# Patient Record
Sex: Female | Born: 1975 | Race: Black or African American | Hispanic: No | Marital: Single | Smoking: Former smoker
Health system: Southern US, Community
[De-identification: ages and names within clinical notes are randomized; demographics above are authoritative.]

## PROBLEM LIST (undated history)

## (undated) DIAGNOSIS — I255 Ischemic cardiomyopathy: Secondary | ICD-10-CM

## (undated) DIAGNOSIS — R519 Headache, unspecified: Secondary | ICD-10-CM

## (undated) DIAGNOSIS — L309 Dermatitis, unspecified: Secondary | ICD-10-CM

## (undated) DIAGNOSIS — D649 Anemia, unspecified: Secondary | ICD-10-CM

## (undated) DIAGNOSIS — R0602 Shortness of breath: Secondary | ICD-10-CM

## (undated) DIAGNOSIS — F419 Anxiety disorder, unspecified: Secondary | ICD-10-CM

## (undated) DIAGNOSIS — I10 Essential (primary) hypertension: Secondary | ICD-10-CM

## (undated) DIAGNOSIS — Z9289 Personal history of other medical treatment: Secondary | ICD-10-CM

## (undated) DIAGNOSIS — K219 Gastro-esophageal reflux disease without esophagitis: Secondary | ICD-10-CM

## (undated) DIAGNOSIS — M869 Osteomyelitis, unspecified: Secondary | ICD-10-CM

## (undated) DIAGNOSIS — E119 Type 2 diabetes mellitus without complications: Secondary | ICD-10-CM

## (undated) DIAGNOSIS — Z87442 Personal history of urinary calculi: Secondary | ICD-10-CM

## (undated) DIAGNOSIS — N186 End stage renal disease: Secondary | ICD-10-CM

## (undated) DIAGNOSIS — R0600 Dyspnea, unspecified: Secondary | ICD-10-CM

## (undated) DIAGNOSIS — G629 Polyneuropathy, unspecified: Secondary | ICD-10-CM

## (undated) DIAGNOSIS — J189 Pneumonia, unspecified organism: Secondary | ICD-10-CM

## (undated) DIAGNOSIS — R51 Headache: Secondary | ICD-10-CM

## (undated) HISTORY — PX: INSERTION OF DIALYSIS CATHETER: SHX1324

---
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
## 2010-12-03 ENCOUNTER — Other Ambulatory Visit (HOSPITAL_COMMUNITY): Payer: Self-pay | Admitting: Podiatry

## 2010-12-03 DIAGNOSIS — M869 Osteomyelitis, unspecified: Secondary | ICD-10-CM

## 2010-12-04 ENCOUNTER — Ambulatory Visit (HOSPITAL_COMMUNITY)
Admission: RE | Admit: 2010-12-04 | Discharge: 2010-12-04 | Disposition: A | Discharge status: Home / Self Care | Payer: Medicaid Other | Source: Ambulatory Visit | Attending: Podiatry | Admitting: Podiatry

## 2010-12-04 DIAGNOSIS — M25476 Effusion, unspecified foot: Secondary | ICD-10-CM | POA: Insufficient documentation

## 2010-12-04 DIAGNOSIS — M869 Osteomyelitis, unspecified: Secondary | ICD-10-CM | POA: Insufficient documentation

## 2010-12-04 DIAGNOSIS — L97509 Non-pressure chronic ulcer of other part of unspecified foot with unspecified severity: Secondary | ICD-10-CM | POA: Insufficient documentation

## 2010-12-04 DIAGNOSIS — E119 Type 2 diabetes mellitus without complications: Secondary | ICD-10-CM | POA: Insufficient documentation

## 2010-12-04 DIAGNOSIS — L02619 Cutaneous abscess of unspecified foot: Secondary | ICD-10-CM | POA: Insufficient documentation

## 2010-12-04 DIAGNOSIS — R609 Edema, unspecified: Secondary | ICD-10-CM | POA: Insufficient documentation

## 2010-12-04 DIAGNOSIS — L03039 Cellulitis of unspecified toe: Secondary | ICD-10-CM | POA: Insufficient documentation

## 2010-12-04 DIAGNOSIS — M25473 Effusion, unspecified ankle: Secondary | ICD-10-CM | POA: Insufficient documentation

## 2010-12-04 LAB — CREATININE, SERUM: Creatinine, Ser: 1.03 mg/dL (ref 0.4–1.2)

## 2010-12-04 MED ORDER — GADOBENATE DIMEGLUMINE 529 MG/ML IV SOLN
20.0000 mL | Freq: Once | INTRAVENOUS | Status: AC | PRN
  Administered 2010-12-04: 20 mL via INTRAVENOUS

## 2011-02-09 ENCOUNTER — Encounter (HOSPITAL_BASED_OUTPATIENT_CLINIC_OR_DEPARTMENT_OTHER): Payer: Medicaid Other | Attending: Internal Medicine

## 2011-02-09 ENCOUNTER — Other Ambulatory Visit (HOSPITAL_BASED_OUTPATIENT_CLINIC_OR_DEPARTMENT_OTHER): Payer: Self-pay | Admitting: Internal Medicine

## 2011-02-09 DIAGNOSIS — Z794 Long term (current) use of insulin: Secondary | ICD-10-CM | POA: Insufficient documentation

## 2011-02-09 DIAGNOSIS — E1169 Type 2 diabetes mellitus with other specified complication: Secondary | ICD-10-CM | POA: Insufficient documentation

## 2011-02-09 DIAGNOSIS — L97509 Non-pressure chronic ulcer of other part of unspecified foot with unspecified severity: Secondary | ICD-10-CM | POA: Insufficient documentation

## 2011-02-10 NOTE — Assessment & Plan Note (Signed)
[facility]  NAME:  Karen [name], Karen [name]                 ACCOUNT NO.:  [id number]  MEDICAL RECORD NO.:  [id number]      DATE OF BIRTH:  Feb 07, 1976  PHYSICIAN:  Orlando Penner. Sevier, M.D.  VISIT DATE:  02/09/2011                                  OFFICE VISIT   HISTORY:  This 35 year old black female, self-referred for evaluation of a diabetic ulceration of her left hallux.  The patient reports that she has had diabetes since 1995 and has been on again, off again in her attention to appropriate self-management of the disease.  A blood sugar here of 400 today would suggest that we are on off again mode and indeed, she mentions that she neglected to take her medication, namely Lantus insulin last p.m. or her oral diabetic agent.  She does say that she has had no ocular or retinal complications for diabetes, of which she is aware and likewise no known kidney disease on that basis.  She is aware of the term hemoglobin A1c, I think she had one done not too long ago and thinks it may have been in the range of 8. She says, however, that her blood sugars when she is attentive to taking her current medications are usually in the 200 range.  With that background history, she apparently developed some months ago a ulcer on the plantar aspect of her left hallux.  She sought treatment for this and was evaluated by physicians from the [facility] area and was found on MRI to have osteomyelitis.  She was referred to a podiatric surgeon here in [place] who reviewed the MRIs and said that she needed her toe amputated and so the patient being unwilling to do that simply never followed up.  She apparently was advised by her primary physician that an amputation was not necessary and when the wound began to drain, additionally become quite malodorous, he recommended Betadine soaks and indeed, the situation seemed to improve and to dry up somewhat.  In the meanwhile, she has talked to several  patients who have been seen here and they have indicated that we might can save this toe and have indicated to her that we have available hyperbaric oxygen therapy, and she is accordingly here today for our evaluation and advice.  PAST MEDICAL HISTORY:  Notable for C-section in 2008 that was for twins, two other hospitalizations for childbirth, one 17 years ago and the other 13 months ago.  She apparently has had no hospitalizations in conjunction with medical problems.  ALLERGIES:  PENICILLIN and MORPHINE which she said they cause rashes.  CURRENT MEDICATIONS:  Lantus 10 units which is supposed to be taken at bedtime daily, but occasionally is taken in the morning instead and as previously indicated was not taken last night at all.  She is also on Kombiglyze XR 5/500 mg once daily as an oral antihyperglycemic agent. She likewise has not taken that in the past 24 hours.  FAMILY HISTORY:  Apparently positive for type 2 diabetes but a little detail was revealed by the patient.  PERSONAL HISTORY:  She is single and lives alone, is fully ambulatory, able to attend all of her personal and household needs.  She says she does not smoke, drink alcohol, or use recreational drugs.  She has the four children as indicated above, a 39 year old and twins 34 years old and a 8-month-old.  She does work part-time for [employer] where she is on her feet 100% of the time and not allowed to wear open toed shoes, but she says this is only for several hours several days a week.  REVIEW OF SYSTEMS:  The patient does have diabetes as indicated above and all the other pertinent features have been covered above.  She of course is not on dialysis.  She has no history of cancer, radiation, or chemotherapy.  She has no history of seizures or other neurologic difficulties.  She does admit that there is some diminished feeling in her feet.  She has no history of peptic ulcer disease, GI bleeding.   No urinary tract difficulties.  She has never been anemic and has no other active symptomatology at present other than that related to her toe.  PHYSICAL EXAMINATION:  VITAL SIGNS:  Blood pressure is 158/107, pulse is 102, respirations are 20, and temperature is 97.7.  Random blood sugar is 400 mg/dL. GENERAL:  She is somewhat obese pleasant early middle-aged black female, in no acute distress although she does later say that she has throbbing pain in her toe and takes Percocet for that as needed which apparently has been provided somewhere else. HEENT:  Her mucous membranes are moist, they are slightly pale. NECK:  Supple.  There is no thyroid enlargement. CHEST:  Grossly clear on auscultation. HEART:  Normal in size with regular rhythm, definite S4. ABDOMEN:  Without organomegaly, masses, or tenderness. EXTREMITIES:  Free of edema other than for a significant soft tissue swelling in the left hallux which will be further described later.  Her peripheral pulses are everywhere palpable.  She has no obvious venous varicosities. NEUROLOGIC:  Not accomplished in detail but is grossly normal with the exception of some decreased sensation in the feet. On the left foot, there is at the distal tuft of the hallux an ulcer measuring 1.3 x 0.5 x 0.1 cm with lots of callus surrounding it and considerable accumulation of old slough which is mostly dry.  There is no tenderness in the toe.  There is no warmth there.  There is no fluctuance.  The wound does not appear at this point to probe to bone.  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  DISPOSITION:  Since the patient says her MRI was done only a month ago while we are attempting to trace down the results of that study, I will not repeat it.  She says, however, that she has not had a routine x-ray of that foot and I will obtain  that at this point, which will probably give me a considerable information as to the degree of destruction of bone if there is any.  We do anticipate that if osteomyelitis is indeed present, she will need HBO, so we have begun appropriate laboratory evaluations for that to include a chest x-ray to be done while her foot is being x-rayed today as well as some broad blood work.  A culture was made of the toe wound today following debridement.  The wound is, in fact, debrided by removal of considerable callus, some old skin, and some rather dried, crusty slough from the wound base.  The wound is then dressed with an application of Santyl and Medihoney covered by collagen dressing  and a bulky wrap.  Plans will be made to see her in 1 week for repeat evaluation and for continued planning.          ______________________________ Orlando Penner. London Pepper, M.D.     RES/MEDQ  D:  02/09/2011  T:  02/10/2011  Job:  [id number]

## 2011-02-17 ENCOUNTER — Ambulatory Visit (HOSPITAL_COMMUNITY)
Admission: RE | Admit: 2011-02-17 | Discharge: 2011-02-17 | Disposition: A | Discharge status: Home / Self Care | Payer: Medicaid Other | Source: Ambulatory Visit | Attending: Internal Medicine | Admitting: Internal Medicine

## 2011-02-17 ENCOUNTER — Other Ambulatory Visit (HOSPITAL_BASED_OUTPATIENT_CLINIC_OR_DEPARTMENT_OTHER): Payer: Self-pay | Admitting: Internal Medicine

## 2011-02-17 DIAGNOSIS — Z01818 Encounter for other preprocedural examination: Secondary | ICD-10-CM | POA: Insufficient documentation

## 2011-02-17 DIAGNOSIS — Z5189 Encounter for other specified aftercare: Secondary | ICD-10-CM

## 2011-02-17 LAB — GLUCOSE, CAPILLARY: Glucose-Capillary: 181 mg/dL — ABNORMAL HIGH (ref 70–99)

## 2011-02-24 ENCOUNTER — Encounter (HOSPITAL_BASED_OUTPATIENT_CLINIC_OR_DEPARTMENT_OTHER): Payer: Medicaid Other | Attending: Internal Medicine

## 2011-02-24 DIAGNOSIS — Z794 Long term (current) use of insulin: Secondary | ICD-10-CM | POA: Insufficient documentation

## 2011-02-24 DIAGNOSIS — E1169 Type 2 diabetes mellitus with other specified complication: Secondary | ICD-10-CM | POA: Insufficient documentation

## 2011-02-24 DIAGNOSIS — L97509 Non-pressure chronic ulcer of other part of unspecified foot with unspecified severity: Secondary | ICD-10-CM | POA: Insufficient documentation

## 2011-03-24 ENCOUNTER — Encounter (HOSPITAL_BASED_OUTPATIENT_CLINIC_OR_DEPARTMENT_OTHER): Payer: Medicaid Other | Attending: General Surgery

## 2011-03-24 DIAGNOSIS — M869 Osteomyelitis, unspecified: Secondary | ICD-10-CM | POA: Insufficient documentation

## 2011-03-24 DIAGNOSIS — M9080 Osteopathy in diseases classified elsewhere, unspecified site: Secondary | ICD-10-CM | POA: Insufficient documentation

## 2011-03-24 DIAGNOSIS — E1169 Type 2 diabetes mellitus with other specified complication: Secondary | ICD-10-CM | POA: Insufficient documentation

## 2011-04-03 NOTE — Progress Notes (Signed)
[facility]  NAME:  Karen [name], Karen [name]                      ACCOUNT NO.:  MEDICAL RECORD NO.:  [id number]      DATE OF BIRTH:  1975-10-06  PHYSICIAN:  Elesa Hacker, M.D.         VISIT DATE:  03/10/2011                                  OFFICE VISIT   ADDRESS:  BODY:  To whom it may concern:  Karen [name] has been seen in our clinic since Feb 09, 2011.  She is a diabetic of longstanding.  There is MRI proof of osteomyelitis and the ulcer of her left great toe.  We have been treating with multiple debridements with offloading and with antibiotics for the past 4 weeks with very little improvement.  In view of the proven osteomyelitis and failure to respond well to conventional therapy, I am certain that she is a candidate for hyperbaric oxygen therapy as adequate wound care and antibiotic therapy has already been used.     Elesa Hacker, M.D.     RA/MEDQ  D:  03/10/2011  T:  03/11/2011  Job:  [id number]

## 2011-04-21 DIAGNOSIS — M869 Osteomyelitis, unspecified: Secondary | ICD-10-CM | POA: Insufficient documentation

## 2011-04-21 NOTE — Progress Notes (Signed)
[facility]  NAME:  [name], [name] NO.:  [id number]  MEDICAL RECORD NO.:  [id number]      DATE OF BIRTH:  1976/02/04  PHYSICIAN:  Elesa Hacker, M.D.         VISIT DATE:  04/21/2011                                  OFFICE VISIT   ADDRESS:  BODY:  To whom it may concern:  [name] has been a patient in our clinic since Feb 09, 2011. She has been treated with all of the routine treatments including antibiotics, multiple debridements, and attempts at offloading.  In the course of the past week, the wound has deteriorated significantly from 0.4 and 0.3 in size to 2.0 and 0.7 in size, with continuing necrosis of subcutaneous tissue.  Clearly, the patient needs further treatment for her MRI-proven osteomyelitis and hyperbaric oxygen is certainly indicated to prevent loss of this toe.     Elesa Hacker, M.D.     RA/MEDQ  D:  04/21/2011  T:  04/21/2011  Job:  [id number]

## 2011-04-22 DIAGNOSIS — M869 Osteomyelitis, unspecified: Secondary | ICD-10-CM

## 2011-04-22 DIAGNOSIS — I771 Stricture of artery: Secondary | ICD-10-CM

## 2011-04-22 DIAGNOSIS — E1140 Type 2 diabetes mellitus with diabetic neuropathy, unspecified: Secondary | ICD-10-CM

## 2011-04-22 DIAGNOSIS — E119 Type 2 diabetes mellitus without complications: Secondary | ICD-10-CM

## 2011-04-22 DIAGNOSIS — IMO0002 Reserved for concepts with insufficient information to code with codable children: Secondary | ICD-10-CM | POA: Insufficient documentation

## 2011-04-22 DIAGNOSIS — E1165 Type 2 diabetes mellitus with hyperglycemia: Secondary | ICD-10-CM | POA: Insufficient documentation

## 2011-04-23 ENCOUNTER — Encounter: Payer: Self-pay | Admitting: Internal Medicine

## 2011-04-23 ENCOUNTER — Ambulatory Visit (INDEPENDENT_AMBULATORY_CARE_PROVIDER_SITE_OTHER): Payer: Medicaid Other | Admitting: Internal Medicine

## 2011-04-23 VITALS — BP 180/111 | HR 106 | Temp 98.0°F | Ht 66.5 in | Wt 231.0 lb

## 2011-04-23 DIAGNOSIS — M869 Osteomyelitis, unspecified: Secondary | ICD-10-CM

## 2011-04-23 MED ORDER — CIPROFLOXACIN HCL 750 MG PO TABS: 750.0000 mg | ORAL_TABLET | Freq: Two times a day (BID) | ORAL | Status: AC

## 2011-04-23 NOTE — Assessment & Plan Note (Signed)
This patient has osteomyelitis on her toe and without IV antibioitics likely will not heal.  No cultures have been done and she has been on antibiotics so culutre  Likely not going to be effective.  Therefore, she will be started empirically with po cipro and Vancomycin which she will need for 6 weeks.  She will be followed by [facility] and will monitor for vancomycin levels for a trough of 15-20.  She will then follow up with me in 6 weeks.

## 2011-04-23 NOTE — Progress Notes (Signed)
  Subjective:    Patient ID: Karen [name], female    DOB: [birth year], 35 y.o.   MRN: [id number]  HPI 35 yo here as a new patient for osteomyelitis.  The patient is a diabetic with previously very poorly controlled type 2 diabetes and developed a left great toe wound several months ago that has been difficult to heal.  She had an MRI in March which showed osteomyelitis.  From the patients report she has not been on IV antibiotics, though she has been on oral antibiotics, though she is not sure of the name.  She was referred to wound care who has been working with hyperbaric oxygen but a recent x-ray does suggest continued osteomyelitis.  She denies any fever, chills or significant pain.  She does state her recent HgbA1C is better, though previously was 13.  She has been poorly compliant with treatments in the past but today reports motivation to get healthier.  She was referred to me for consideration of IV antibiotics.      Review of Systems  All other systems reviewed and are negative.       Objective:   Physical Exam  Constitutional: She is oriented to person, place, and time. She appears well-developed and well-nourished.       obese  HENT:  Mouth/Throat: No oropharyngeal exudate.  Cardiovascular: Normal rate, regular rhythm and normal heart sounds.   No murmur heard. Pulmonary/Chest: Effort normal and breath sounds normal. No respiratory distress. She has no wheezes.  Abdominal: Soft. Bowel sounds are normal. She exhibits no distension.  Lymphadenopathy:    She has no cervical adenopathy.  Neurological: She is alert and oriented to person, place, and time.  Skin: Skin is warm and dry. No rash noted. No erythema.  Psychiatric: She has a normal mood and affect. Her behavior is normal.          Assessment & Plan:

## 2011-04-27 ENCOUNTER — Telehealth: Payer: Self-pay | Admitting: *Deleted

## 2011-04-27 NOTE — Telephone Encounter (Signed)
Pharmacist from [facility] called to ask if we had other creatinine results. todays is 2.0. Last one was 1.03 done 12/04/10. She stated she was going to stop it for 24 hours, then restart & follow the labs

## 2011-05-04 ENCOUNTER — Telehealth: Payer: Self-pay | Admitting: Licensed Clinical Social Worker

## 2011-05-04 NOTE — Telephone Encounter (Signed)
Patient called stating that her kidney function has been abnormal per her nurse Inez Catalina with [facility], the nurse was out today collecting labs and she stated she would fax the results asap. The nurse stated that the bloodwork would go to [facility] it would  take about an hour to get the results back.

## 2011-05-11 ENCOUNTER — Telehealth: Payer: Self-pay | Admitting: Licensed Clinical Social Worker

## 2011-05-11 NOTE — Telephone Encounter (Signed)
RN called stating that the patient has refused to take vancomycin that she is getting in her picc line and has not taking any since 05/05/2011. She thinks that it is causing her back pain, but she was diagnosed with a UTI yesterday at the ED. RN wanted Dr. Linus Salmons to be aware that the patient is not taking her medications.

## 2011-05-12 ENCOUNTER — Telehealth: Payer: Self-pay | Admitting: *Deleted

## 2011-05-12 NOTE — Telephone Encounter (Signed)
Told her md wanted to see her & transferred to front

## 2011-05-12 NOTE — Telephone Encounter (Signed)
Gave order to [facility] to remove PICC line per Dr. Linus Salmons, patient is refusing IV antibiotic.  She can continue to take Cipro by mouth and she needs to be scheduled appointment with Dr. Linus Salmons for 05/15/11 or 05/21/11. Myrtis Hopping CMA

## 2011-05-14 ENCOUNTER — Telehealth: Payer: Self-pay | Admitting: *Deleted

## 2011-05-14 NOTE — Telephone Encounter (Signed)
I read her the note from other staff member's call to her. She is to stop the cipro & will have a bx at next visit. I confirmed the date & time of her next appt

## 2011-05-14 NOTE — Telephone Encounter (Signed)
Left message for patient to call office.  Dr. Linus Salmons wants her to stop the Cipro.  He wants to order a biopsy of her foot at her next visit so he wants her off the antibiotics. Myrtis Hopping CMA

## 2011-05-15 ENCOUNTER — Telehealth: Payer: Self-pay | Admitting: *Deleted

## 2011-05-15 NOTE — Telephone Encounter (Signed)
Left message for patient to call.  Need to know if she has ever been seen by podiatrist or orthopedist.  Dr. Linus Salmons is wanting biopsy of her toe and this is not something Radiology does. Myrtis Hopping CMA

## 2011-05-15 NOTE — Telephone Encounter (Signed)
Patient can not have biopsy of her toe at [facility].  Referred to [facility] and is scheduled an appointment for 05/19/11 at 2:15 pm with Dr. Beola Cord.  Office notes and MRI faxed.  Patient's [place] [facility] is [facility] (Dr. Lenna Gilford) in [place].  I called there and they have agreed to do the referral and provide their Access Number.  Patient aware of appointment date, time and address. Myrtis Hopping CMA

## 2011-05-21 ENCOUNTER — Ambulatory Visit: Payer: Medicaid Other | Admitting: Internal Medicine

## 2011-05-26 ENCOUNTER — Encounter (HOSPITAL_BASED_OUTPATIENT_CLINIC_OR_DEPARTMENT_OTHER): Payer: Medicaid Other | Attending: General Surgery

## 2011-05-26 DIAGNOSIS — M9080 Osteopathy in diseases classified elsewhere, unspecified site: Secondary | ICD-10-CM | POA: Insufficient documentation

## 2011-05-26 DIAGNOSIS — E1169 Type 2 diabetes mellitus with other specified complication: Secondary | ICD-10-CM | POA: Insufficient documentation

## 2011-05-26 DIAGNOSIS — M869 Osteomyelitis, unspecified: Secondary | ICD-10-CM | POA: Insufficient documentation

## 2011-05-27 ENCOUNTER — Encounter: Payer: Self-pay | Admitting: Internal Medicine

## 2011-06-23 ENCOUNTER — Encounter (HOSPITAL_BASED_OUTPATIENT_CLINIC_OR_DEPARTMENT_OTHER): Payer: Medicaid Other | Attending: General Surgery

## 2011-06-23 DIAGNOSIS — Z79899 Other long term (current) drug therapy: Secondary | ICD-10-CM | POA: Insufficient documentation

## 2011-06-23 DIAGNOSIS — E1169 Type 2 diabetes mellitus with other specified complication: Secondary | ICD-10-CM | POA: Insufficient documentation

## 2011-06-23 DIAGNOSIS — Z794 Long term (current) use of insulin: Secondary | ICD-10-CM | POA: Insufficient documentation

## 2011-06-23 DIAGNOSIS — L97509 Non-pressure chronic ulcer of other part of unspecified foot with unspecified severity: Secondary | ICD-10-CM | POA: Insufficient documentation

## 2011-06-23 DIAGNOSIS — L84 Corns and callosities: Secondary | ICD-10-CM | POA: Insufficient documentation

## 2011-06-23 DIAGNOSIS — Z8614 Personal history of Methicillin resistant Staphylococcus aureus infection: Secondary | ICD-10-CM | POA: Insufficient documentation

## 2011-07-07 ENCOUNTER — Other Ambulatory Visit (HOSPITAL_BASED_OUTPATIENT_CLINIC_OR_DEPARTMENT_OTHER): Payer: Self-pay | Admitting: General Surgery

## 2011-07-07 LAB — GLUCOSE, CAPILLARY: Glucose-Capillary: 261 mg/dL — ABNORMAL HIGH (ref 70–99)

## 2011-07-28 ENCOUNTER — Encounter (HOSPITAL_BASED_OUTPATIENT_CLINIC_OR_DEPARTMENT_OTHER): Payer: Medicaid Other | Attending: General Surgery

## 2011-07-28 DIAGNOSIS — Z8614 Personal history of Methicillin resistant Staphylococcus aureus infection: Secondary | ICD-10-CM | POA: Insufficient documentation

## 2011-07-28 DIAGNOSIS — L84 Corns and callosities: Secondary | ICD-10-CM | POA: Insufficient documentation

## 2011-07-28 DIAGNOSIS — E1169 Type 2 diabetes mellitus with other specified complication: Secondary | ICD-10-CM | POA: Insufficient documentation

## 2011-07-28 DIAGNOSIS — L97509 Non-pressure chronic ulcer of other part of unspecified foot with unspecified severity: Secondary | ICD-10-CM | POA: Insufficient documentation

## 2011-07-28 DIAGNOSIS — Z79899 Other long term (current) drug therapy: Secondary | ICD-10-CM | POA: Insufficient documentation

## 2011-07-28 DIAGNOSIS — Z794 Long term (current) use of insulin: Secondary | ICD-10-CM | POA: Insufficient documentation

## 2011-09-08 ENCOUNTER — Encounter (HOSPITAL_BASED_OUTPATIENT_CLINIC_OR_DEPARTMENT_OTHER): Payer: Medicaid Other | Attending: General Surgery

## 2011-10-06 ENCOUNTER — Encounter (HOSPITAL_BASED_OUTPATIENT_CLINIC_OR_DEPARTMENT_OTHER): Payer: Medicaid Other

## 2011-10-13 ENCOUNTER — Encounter (HOSPITAL_BASED_OUTPATIENT_CLINIC_OR_DEPARTMENT_OTHER): Payer: Medicaid Other | Attending: General Surgery

## 2011-11-03 ENCOUNTER — Encounter (HOSPITAL_BASED_OUTPATIENT_CLINIC_OR_DEPARTMENT_OTHER): Payer: Medicaid Other | Attending: General Surgery

## 2011-11-03 DIAGNOSIS — L97509 Non-pressure chronic ulcer of other part of unspecified foot with unspecified severity: Secondary | ICD-10-CM | POA: Insufficient documentation

## 2011-11-03 DIAGNOSIS — E1169 Type 2 diabetes mellitus with other specified complication: Secondary | ICD-10-CM | POA: Insufficient documentation

## 2011-11-03 DIAGNOSIS — Z794 Long term (current) use of insulin: Secondary | ICD-10-CM | POA: Insufficient documentation

## 2011-11-10 ENCOUNTER — Ambulatory Visit (HOSPITAL_COMMUNITY)
Admission: RE | Admit: 2011-11-10 | Discharge: 2011-11-10 | Disposition: A | Discharge status: Home / Self Care | Payer: Medicaid Other | Source: Ambulatory Visit | Attending: General Surgery | Admitting: General Surgery

## 2011-11-10 ENCOUNTER — Other Ambulatory Visit (HOSPITAL_BASED_OUTPATIENT_CLINIC_OR_DEPARTMENT_OTHER): Payer: Self-pay | Admitting: General Surgery

## 2011-11-10 DIAGNOSIS — E1169 Type 2 diabetes mellitus with other specified complication: Secondary | ICD-10-CM | POA: Insufficient documentation

## 2011-11-10 DIAGNOSIS — B999 Unspecified infectious disease: Secondary | ICD-10-CM

## 2011-11-10 DIAGNOSIS — L97509 Non-pressure chronic ulcer of other part of unspecified foot with unspecified severity: Secondary | ICD-10-CM | POA: Insufficient documentation

## 2011-11-10 DIAGNOSIS — L089 Local infection of the skin and subcutaneous tissue, unspecified: Secondary | ICD-10-CM | POA: Insufficient documentation

## 2011-11-10 DIAGNOSIS — Z01818 Encounter for other preprocedural examination: Secondary | ICD-10-CM | POA: Insufficient documentation

## 2011-11-10 DIAGNOSIS — R52 Pain, unspecified: Secondary | ICD-10-CM

## 2011-11-10 NOTE — Progress Notes (Signed)
[facility]  NAME:  RAYELYN, SCHEIBLE                 ACCOUNT NO.:  [id number]  MEDICAL RECORD NO.:  [id number]      DATE OF BIRTH:  26-Apr-1976  PHYSICIAN:  Elesa Hacker, M.D.         VISIT DATE:  11/10/2011                                  OFFICE VISIT   CHIEF COMPLAINT:  Recurrent ulceration, left great toe.  HISTORY OF PRESENT ILLNESS:  This is a 36 year old female, diabetic for more than 10 years, on insulin, had a diabetic foot ulcer of the left great toe, which was healed on August 18, 2011.  She reverted back to her regular foot wear, regular activities, and the ulcer was noted to have recurred approximately 7 days ago.  PAST MEDICAL HISTORY:  Significant for diabetes and basically otherwise healthy.  PAST SURGICAL HISTORY:  She has had a C-section.  ALLERGIES:  PENICILLIN and MORPHINE.  CURRENT MEDICATIONS:  Insulin.  PERSONAL HISTORY:  No cigarettes.  No alcohol.  REVIEW OF SYSTEMS:  Essentially negative.  PHYSICAL EXAMINATION:  GENERAL:  Awake, alert, in no distress. VITAL SIGNS:  We have blood pressure pulse on her.  Temperature 98.4, pulse 97, respirations 20, blood pressure 176/18, glucose is 246. HEAD, EYES, EARS, NOSE, AND THROAT:  Normal. NECK:  Supple. CHEST:  Clear. HEART:  Regular rhythm. EXTREMITIES:  Reveals a bounding distal pulses.  There is a very thick callus on the left great toe with a 2.3 x 1.7 ulceration which is uncovered after debridement.  IMPRESSION:  Recurrent diabetic foot ulcer, possibly of osteomyelitis.  PLAN:  MRI to rule out osteomyelitis.  Laboratory work to see the effect of her glucose treatment.  We will see her in 7 days.  Also, we have recommended Darco boot and complete offloading.     Elesa Hacker, M.D.     RA/MEDQ  D:  11/10/2011  T:  11/10/2011  Job:  [id number]

## 2011-11-17 ENCOUNTER — Inpatient Hospital Stay (HOSPITAL_COMMUNITY): Admission: RE | Admit: 2011-11-17 | Payer: Medicaid Other | Source: Ambulatory Visit

## 2011-11-24 ENCOUNTER — Encounter (HOSPITAL_BASED_OUTPATIENT_CLINIC_OR_DEPARTMENT_OTHER): Payer: Medicaid Other | Attending: General Surgery

## 2011-11-24 DIAGNOSIS — L97509 Non-pressure chronic ulcer of other part of unspecified foot with unspecified severity: Secondary | ICD-10-CM | POA: Insufficient documentation

## 2011-11-24 DIAGNOSIS — L84 Corns and callosities: Secondary | ICD-10-CM | POA: Insufficient documentation

## 2011-11-24 DIAGNOSIS — E1169 Type 2 diabetes mellitus with other specified complication: Secondary | ICD-10-CM | POA: Insufficient documentation

## 2011-12-01 ENCOUNTER — Ambulatory Visit: Payer: Medicaid Other | Admitting: Infectious Diseases

## 2011-12-01 ENCOUNTER — Encounter (HOSPITAL_BASED_OUTPATIENT_CLINIC_OR_DEPARTMENT_OTHER): Payer: Medicaid Other

## 2011-12-08 ENCOUNTER — Ambulatory Visit: Payer: Medicaid Other | Admitting: Infectious Disease

## 2011-12-22 ENCOUNTER — Encounter (HOSPITAL_BASED_OUTPATIENT_CLINIC_OR_DEPARTMENT_OTHER): Payer: Medicaid Other | Attending: General Surgery

## 2011-12-22 DIAGNOSIS — E1169 Type 2 diabetes mellitus with other specified complication: Secondary | ICD-10-CM | POA: Insufficient documentation

## 2011-12-22 DIAGNOSIS — Z794 Long term (current) use of insulin: Secondary | ICD-10-CM | POA: Insufficient documentation

## 2011-12-22 DIAGNOSIS — L97509 Non-pressure chronic ulcer of other part of unspecified foot with unspecified severity: Secondary | ICD-10-CM | POA: Insufficient documentation

## 2011-12-24 ENCOUNTER — Ambulatory Visit (HOSPITAL_COMMUNITY)
Admission: RE | Admit: 2011-12-24 | Discharge: 2011-12-24 | Disposition: A | Discharge status: Home / Self Care | Payer: Medicaid Other | Source: Ambulatory Visit | Attending: General Surgery | Admitting: General Surgery

## 2011-12-24 ENCOUNTER — Other Ambulatory Visit (HOSPITAL_BASED_OUTPATIENT_CLINIC_OR_DEPARTMENT_OTHER): Payer: Self-pay | Admitting: General Surgery

## 2011-12-24 DIAGNOSIS — E1169 Type 2 diabetes mellitus with other specified complication: Secondary | ICD-10-CM | POA: Insufficient documentation

## 2011-12-24 DIAGNOSIS — M869 Osteomyelitis, unspecified: Secondary | ICD-10-CM | POA: Insufficient documentation

## 2011-12-24 DIAGNOSIS — L97509 Non-pressure chronic ulcer of other part of unspecified foot with unspecified severity: Secondary | ICD-10-CM | POA: Insufficient documentation

## 2012-01-26 ENCOUNTER — Encounter (HOSPITAL_BASED_OUTPATIENT_CLINIC_OR_DEPARTMENT_OTHER): Payer: Medicaid Other | Attending: General Surgery

## 2012-01-26 DIAGNOSIS — L97509 Non-pressure chronic ulcer of other part of unspecified foot with unspecified severity: Secondary | ICD-10-CM | POA: Insufficient documentation

## 2012-01-26 DIAGNOSIS — E1169 Type 2 diabetes mellitus with other specified complication: Secondary | ICD-10-CM | POA: Insufficient documentation

## 2012-01-26 DIAGNOSIS — Z794 Long term (current) use of insulin: Secondary | ICD-10-CM | POA: Insufficient documentation

## 2012-02-09 ENCOUNTER — Inpatient Hospital Stay (HOSPITAL_COMMUNITY): Admission: RE | Admit: 2012-02-09 | Payer: Medicaid Other | Source: Ambulatory Visit

## 2012-02-23 ENCOUNTER — Inpatient Hospital Stay (HOSPITAL_COMMUNITY): Admission: RE | Admit: 2012-02-23 | Payer: Medicaid Other | Source: Ambulatory Visit

## 2012-03-01 ENCOUNTER — Encounter (HOSPITAL_BASED_OUTPATIENT_CLINIC_OR_DEPARTMENT_OTHER): Payer: Medicaid Other | Attending: General Surgery

## 2012-03-01 DIAGNOSIS — E1169 Type 2 diabetes mellitus with other specified complication: Secondary | ICD-10-CM | POA: Insufficient documentation

## 2012-03-01 DIAGNOSIS — L97509 Non-pressure chronic ulcer of other part of unspecified foot with unspecified severity: Secondary | ICD-10-CM | POA: Insufficient documentation

## 2012-03-03 NOTE — Progress Notes (Signed)
[facility]  NAME:  Karen [name], PATIN NO.:  [id number]  MEDICAL RECORD NO.:  [id number]      DATE OF BIRTH:  22-Jul-1976  PHYSICIAN:  Elesa Hacker, M.D.         VISIT DATE:  03/03/2012                                  OFFICE VISIT   ADDRESS:  [address] [place], [place].  BODY:  Dear Ms. Biggs:  Karen [name] suffers with a very chronic diabetic foot ulcer.  We want to see her weekly, but she is hardly ever keeps her appointments.  The expected length of treatment is indefinite and I do not believe there is any reason why she cannot appear in court.     Elesa Hacker, M.D.     RA/MEDQ  D:  03/03/2012  T:  03/03/2012  Job:  [id number]

## 2012-03-22 NOTE — Progress Notes (Signed)
[facility]  NAME:  SAVERA, MISSOU[place] NO.:  [id number]  MEDICAL RECORD NO.:  [id number]      DATE OF BIRTH:  [birth year]  PHYSICIAN:  Elesa Hacker, M.D.              VISIT DATE:                                  OFFICE VISIT   ADDRESS:  Hansel Starling. Gwendlyn Deutscher, attorney. [address] [place],  [id number].  BODY:  Dear Dr. Gwendlyn Deutscher:  According to the letter that you said me requesting with the judge wants to know more about Jonna Coup.  The diagnosis is: 1. Chronic diabetic foot ulcer. 2. Offloading and debridement and antibiotic treatment is recommended.     We have [facility] ago,     but the patient has not been there yet. 3. Expected length of treatment is indefinite.  She has had the ulcer     for more than a year already. 4. I believe she could come to court while the less she walks the     better.  The patient is able to walk with her Darco shoe, but     minimal activity is recommended. 5. If she would follow our instruction to the letter, I believe she     would improve.  Although at this point, I am uncertain as to     whether or not the situation will ever be completely cured with an     amputation of the affected toe.     Elesa Hacker, M.D.     RA/MEDQ  D:  03/22/2012  T:  03/22/2012  Job:  [id number]

## 2012-03-22 NOTE — Progress Notes (Signed)
[facility]  NAME:  SURIYA, BALDY NO.:  [id number]  MEDICAL RECORD NO.:  [id number]      DATE OF BIRTH:  [birth year]  PHYSICIAN:  Elesa Hacker, M.D.              VISIT DATE:                                  OFFICE VISIT   ADDRESS:  [address] Starling. Gwendlyn Deutscher, attorney. [address] [place], [place] [address].  BODY:  Dear Gwendlyn Deutscher:  According to the letter that sent me requesting what the judge wants to know about Jonna Coup. Her diagnoses is: 1. Chronic diabetic foot ulcer. 2. Offloading and debridement and antibiotic treatment is recommended.     We usually see our patients weekly. 3. Expected length of treatment is indefinite.  She has already had     this ulcer for more than a year. 4. I believe she could come to the court while the less she walks the better. 5. If she would follow our instructions to the letter, I believe she     would improve.  Although at this point, I am uncertain as to     whether or not this situation will ever be completely cured without     an amputation of the affected toe.     Elesa Hacker, M.D.     RA/MEDQ  D:  03/22/2012  T:  03/22/2012  Job:  [id number]

## 2012-03-23 ENCOUNTER — Ambulatory Visit: Payer: Medicaid Other | Admitting: Internal Medicine

## 2012-03-29 ENCOUNTER — Encounter (HOSPITAL_BASED_OUTPATIENT_CLINIC_OR_DEPARTMENT_OTHER): Payer: Medicaid Other

## 2012-04-05 ENCOUNTER — Ambulatory Visit: Payer: Medicaid Other | Admitting: Internal Medicine

## 2012-04-12 ENCOUNTER — Encounter (HOSPITAL_BASED_OUTPATIENT_CLINIC_OR_DEPARTMENT_OTHER): Payer: Medicaid Other | Attending: General Surgery

## 2012-04-12 DIAGNOSIS — E1169 Type 2 diabetes mellitus with other specified complication: Secondary | ICD-10-CM | POA: Insufficient documentation

## 2012-04-12 DIAGNOSIS — L84 Corns and callosities: Secondary | ICD-10-CM | POA: Insufficient documentation

## 2012-04-12 DIAGNOSIS — L97509 Non-pressure chronic ulcer of other part of unspecified foot with unspecified severity: Secondary | ICD-10-CM | POA: Insufficient documentation

## 2012-04-26 ENCOUNTER — Ambulatory Visit: Payer: Medicaid Other | Admitting: Internal Medicine

## 2012-04-26 ENCOUNTER — Encounter (HOSPITAL_BASED_OUTPATIENT_CLINIC_OR_DEPARTMENT_OTHER): Payer: Medicaid Other | Attending: General Surgery

## 2012-04-26 DIAGNOSIS — L97509 Non-pressure chronic ulcer of other part of unspecified foot with unspecified severity: Secondary | ICD-10-CM | POA: Insufficient documentation

## 2012-04-26 DIAGNOSIS — E1169 Type 2 diabetes mellitus with other specified complication: Secondary | ICD-10-CM | POA: Insufficient documentation

## 2012-04-28 ENCOUNTER — Telehealth: Payer: Self-pay | Admitting: *Deleted

## 2012-04-28 NOTE — Telephone Encounter (Signed)
Called patient to try and reschedule her missed appt and was unable to get her. Left a message for her to call the office and reschedule if she still needs the appt. Will also call Dr Marigene Ehlers office and advise that the patient no showed for this appt and 5 others since March so they know we are trying but the patient is not compliant.

## 2012-05-10 ENCOUNTER — Ambulatory Visit: Payer: Medicaid Other | Admitting: Internal Medicine

## 2012-05-10 ENCOUNTER — Telehealth: Payer: Self-pay | Admitting: *Deleted

## 2012-05-10 NOTE — Telephone Encounter (Signed)
Called patient to reschedule her missed appt. Had to leave a message on her voicemail.

## 2012-05-24 ENCOUNTER — Encounter (HOSPITAL_BASED_OUTPATIENT_CLINIC_OR_DEPARTMENT_OTHER): Payer: Medicaid Other

## 2012-06-07 ENCOUNTER — Encounter (HOSPITAL_BASED_OUTPATIENT_CLINIC_OR_DEPARTMENT_OTHER): Payer: Medicaid Other | Attending: General Surgery

## 2012-06-07 DIAGNOSIS — L84 Corns and callosities: Secondary | ICD-10-CM | POA: Insufficient documentation

## 2012-06-07 DIAGNOSIS — E1169 Type 2 diabetes mellitus with other specified complication: Secondary | ICD-10-CM | POA: Insufficient documentation

## 2012-06-07 DIAGNOSIS — L97509 Non-pressure chronic ulcer of other part of unspecified foot with unspecified severity: Secondary | ICD-10-CM | POA: Insufficient documentation

## 2012-08-09 ENCOUNTER — Ambulatory Visit: Payer: Medicaid Other | Admitting: Internal Medicine

## 2012-08-10 ENCOUNTER — Ambulatory Visit: Payer: Medicaid Other | Admitting: Internal Medicine

## 2012-11-29 ENCOUNTER — Encounter (HOSPITAL_BASED_OUTPATIENT_CLINIC_OR_DEPARTMENT_OTHER): Payer: Medicaid Other | Attending: General Surgery

## 2012-11-29 DIAGNOSIS — L97509 Non-pressure chronic ulcer of other part of unspecified foot with unspecified severity: Secondary | ICD-10-CM | POA: Insufficient documentation

## 2012-11-29 DIAGNOSIS — I10 Essential (primary) hypertension: Secondary | ICD-10-CM | POA: Insufficient documentation

## 2012-11-29 DIAGNOSIS — Z880 Allergy status to penicillin: Secondary | ICD-10-CM | POA: Insufficient documentation

## 2012-11-29 DIAGNOSIS — E1169 Type 2 diabetes mellitus with other specified complication: Secondary | ICD-10-CM | POA: Insufficient documentation

## 2012-12-02 LAB — GLUCOSE, CAPILLARY

## 2012-12-03 NOTE — Progress Notes (Signed)
[facility]  NAME:  Karen [name], Karen [name] NO.:  [id number]  MEDICAL RECORD NO.:  [id number]      DATE OF BIRTH:  1976-07-20  PHYSICIAN:  Judene Companion, M.D.      VISIT DATE:  12/02/2012                                  OFFICE VISIT   She is a 37 year old African American female with type 2 diabetes, although she is on insulin at the present time.  She has been a patient here in the past because of diabetic foot ulcers.  The most recent being on her left great toe, where a year ago an x-ray showed that she had osteomyelitis of the distal phalanx.  Today, I saw her and it has a lot of callus which I debrided that would classify her as a diabetic foot ulcer, left great toe, Wagner 3.  She also has hypertension.  She has had several C-sections in the past.  She lives alone with her children of which she has 63, the oldest is 54, the youngest is 2.  She works part- time at [employer] and is on her feet a lot.  She has a history of being allergic to penicillin and she has not had any heart problems, kidney problems, or pulmonary problems.  Her doctor saw her with this ulcer last week and started her on Bactrim DS 1 b.i.d.  She was found today to have a blood pressure 150/89, respirations 18, pulse 88, temperature 98.4.  She weighs 223 pounds.  Her blood sugars in the past when she has been here have been anywhere from 400-150.  I am going to treat this ulcer which is about 1 cm in diameter after I trimmed away all of the callus, and we are going to treat it with Darco shoes and a silver alginate dressings.     Judene Companion, M.D.     PP/MEDQ  D:  12/02/2012  T:  12/03/2012  Job:  [id number]

## 2013-01-16 ENCOUNTER — Ambulatory Visit: Payer: Medicaid Other | Admitting: *Deleted

## 2013-02-02 ENCOUNTER — Telehealth (HOSPITAL_COMMUNITY): Payer: Self-pay | Admitting: Internal Medicine

## 2013-02-02 ENCOUNTER — Encounter (HOSPITAL_COMMUNITY): Payer: Self-pay | Admitting: General Practice

## 2013-02-02 ENCOUNTER — Inpatient Hospital Stay (HOSPITAL_COMMUNITY)
Admission: AD | Admit: 2013-02-02 | Discharge: 2013-02-10 | DRG: 503 | Disposition: A | Discharge status: Home / Self Care | Payer: Medicaid Other | Source: Other Acute Inpatient Hospital | Attending: Internal Medicine | Admitting: Internal Medicine

## 2013-02-02 ENCOUNTER — Inpatient Hospital Stay (HOSPITAL_COMMUNITY): Payer: Medicaid Other

## 2013-02-02 DIAGNOSIS — M9080 Osteopathy in diseases classified elsewhere, unspecified site: Secondary | ICD-10-CM | POA: Diagnosis present

## 2013-02-02 DIAGNOSIS — E872 Acidosis, unspecified: Secondary | ICD-10-CM | POA: Diagnosis present

## 2013-02-02 DIAGNOSIS — I4729 Other ventricular tachycardia: Secondary | ICD-10-CM | POA: Diagnosis present

## 2013-02-02 DIAGNOSIS — E119 Type 2 diabetes mellitus without complications: Secondary | ICD-10-CM

## 2013-02-02 DIAGNOSIS — E1149 Type 2 diabetes mellitus with other diabetic neurological complication: Secondary | ICD-10-CM

## 2013-02-02 DIAGNOSIS — I5041 Acute combined systolic (congestive) and diastolic (congestive) heart failure: Secondary | ICD-10-CM

## 2013-02-02 DIAGNOSIS — I798 Other disorders of arteries, arterioles and capillaries in diseases classified elsewhere: Secondary | ICD-10-CM | POA: Diagnosis present

## 2013-02-02 DIAGNOSIS — N049 Nephrotic syndrome with unspecified morphologic changes: Secondary | ICD-10-CM | POA: Diagnosis present

## 2013-02-02 DIAGNOSIS — I255 Ischemic cardiomyopathy: Secondary | ICD-10-CM

## 2013-02-02 DIAGNOSIS — D638 Anemia in other chronic diseases classified elsewhere: Secondary | ICD-10-CM | POA: Diagnosis present

## 2013-02-02 DIAGNOSIS — I739 Peripheral vascular disease, unspecified: Secondary | ICD-10-CM

## 2013-02-02 DIAGNOSIS — E118 Type 2 diabetes mellitus with unspecified complications: Secondary | ICD-10-CM | POA: Diagnosis present

## 2013-02-02 DIAGNOSIS — M86179 Other acute osteomyelitis, unspecified ankle and foot: Principal | ICD-10-CM | POA: Diagnosis present

## 2013-02-02 DIAGNOSIS — E8809 Other disorders of plasma-protein metabolism, not elsewhere classified: Secondary | ICD-10-CM | POA: Diagnosis present

## 2013-02-02 DIAGNOSIS — N179 Acute kidney failure, unspecified: Secondary | ICD-10-CM

## 2013-02-02 DIAGNOSIS — I129 Hypertensive chronic kidney disease with stage 1 through stage 4 chronic kidney disease, or unspecified chronic kidney disease: Secondary | ICD-10-CM | POA: Diagnosis present

## 2013-02-02 DIAGNOSIS — I2589 Other forms of chronic ischemic heart disease: Secondary | ICD-10-CM | POA: Diagnosis present

## 2013-02-02 DIAGNOSIS — E1142 Type 2 diabetes mellitus with diabetic polyneuropathy: Secondary | ICD-10-CM | POA: Diagnosis present

## 2013-02-02 DIAGNOSIS — D72829 Elevated white blood cell count, unspecified: Secondary | ICD-10-CM | POA: Diagnosis present

## 2013-02-02 DIAGNOSIS — Z794 Long term (current) use of insulin: Secondary | ICD-10-CM

## 2013-02-02 DIAGNOSIS — I472 Ventricular tachycardia, unspecified: Secondary | ICD-10-CM | POA: Diagnosis present

## 2013-02-02 DIAGNOSIS — I509 Heart failure, unspecified: Secondary | ICD-10-CM

## 2013-02-02 DIAGNOSIS — I10 Essential (primary) hypertension: Secondary | ICD-10-CM

## 2013-02-02 DIAGNOSIS — N189 Chronic kidney disease, unspecified: Secondary | ICD-10-CM | POA: Diagnosis present

## 2013-02-02 DIAGNOSIS — E1140 Type 2 diabetes mellitus with diabetic neuropathy, unspecified: Secondary | ICD-10-CM

## 2013-02-02 DIAGNOSIS — M869 Osteomyelitis, unspecified: Secondary | ICD-10-CM

## 2013-02-02 DIAGNOSIS — E1165 Type 2 diabetes mellitus with hyperglycemia: Secondary | ICD-10-CM | POA: Diagnosis present

## 2013-02-02 DIAGNOSIS — E1159 Type 2 diabetes mellitus with other circulatory complications: Secondary | ICD-10-CM | POA: Diagnosis present

## 2013-02-02 HISTORY — DX: Anemia, unspecified: D64.9

## 2013-02-02 HISTORY — DX: Type 2 diabetes mellitus without complications: E11.9

## 2013-02-02 HISTORY — DX: Essential (primary) hypertension: I10

## 2013-02-02 HISTORY — DX: Gastro-esophageal reflux disease without esophagitis: K21.9

## 2013-02-02 HISTORY — DX: Osteomyelitis, unspecified: M86.9

## 2013-02-02 HISTORY — DX: Personal history of other medical treatment: Z92.89

## 2013-02-02 HISTORY — DX: Shortness of breath: R06.02

## 2013-02-02 LAB — CREATININE, SERUM
GFR calc Af Amer: 13 mL/min — ABNORMAL LOW (ref >90)
GFR calc non Af Amer: 11 mL/min — ABNORMAL LOW (ref >90)

## 2013-02-02 LAB — CBC
HCT: 24.1 % — ABNORMAL LOW (ref 36.0–46.0)
Hemoglobin: 7.8 g/dL — ABNORMAL LOW (ref 12.0–15.0)
WBC: 13.4 10*3/uL — ABNORMAL HIGH (ref 4.0–10.5)

## 2013-02-02 MED ORDER — HYDRALAZINE HCL 50 MG PO TABS
50.0000 mg | ORAL_TABLET | Freq: Two times a day (BID) | ORAL | Status: DC
  Administered 2013-02-02 – 2013-02-05 (×7): 50 mg via ORAL
  Filled 2013-02-02 (×9): qty 1

## 2013-02-02 MED ORDER — LEVOFLOXACIN 750 MG PO TABS
750.0000 mg | ORAL_TABLET | Freq: Every day | ORAL | Status: DC
  Administered 2013-02-02: 750 mg via ORAL
  Filled 2013-02-02 (×2): qty 1

## 2013-02-02 MED ORDER — HYDRALAZINE HCL 20 MG/ML IJ SOLN
10.0000 mg | Freq: Four times a day (QID) | INTRAMUSCULAR | Status: DC | PRN
  Administered 2013-02-03 – 2013-02-06 (×2): 10 mg via INTRAVENOUS
  Filled 2013-02-02: qty 1

## 2013-02-02 MED ORDER — INSULIN ASPART 100 UNIT/ML ~~LOC~~ SOLN: 0.0000 [IU] | Freq: Every day | SUBCUTANEOUS | Status: DC

## 2013-02-02 MED ORDER — GUAIFENESIN-DM 100-10 MG/5ML PO SYRP
5.0000 mL | ORAL_SOLUTION | ORAL | Status: DC | PRN
  Administered 2013-02-03 – 2013-02-10 (×11): 5 mL via ORAL
  Filled 2013-02-02 (×12): qty 5

## 2013-02-02 MED ORDER — HEPARIN SODIUM (PORCINE) 5000 UNIT/ML IJ SOLN
5000.0000 [IU] | Freq: Three times a day (TID) | INTRAMUSCULAR | Status: DC
  Administered 2013-02-02 – 2013-02-10 (×20): 5000 [IU] via SUBCUTANEOUS
  Filled 2013-02-02 (×26): qty 1

## 2013-02-02 MED ORDER — INSULIN ASPART 100 UNIT/ML ~~LOC~~ SOLN
0.0000 [IU] | Freq: Three times a day (TID) | SUBCUTANEOUS | Status: DC
  Administered 2013-02-03 – 2013-02-09 (×5): 2 [IU] via SUBCUTANEOUS
  Administered 2013-02-09: 3 [IU] via SUBCUTANEOUS

## 2013-02-02 MED ORDER — SODIUM CHLORIDE 0.9 % IJ SOLN
3.0000 mL | Freq: Two times a day (BID) | INTRAMUSCULAR | Status: DC
  Administered 2013-02-02 – 2013-02-06 (×4): 3 mL via INTRAVENOUS

## 2013-02-02 MED ORDER — INSULIN GLARGINE 100 UNIT/ML ~~LOC~~ SOLN
20.0000 [IU] | Freq: Every day | SUBCUTANEOUS | Status: DC
  Administered 2013-02-02 – 2013-02-03 (×2): 20 [IU] via SUBCUTANEOUS
  Filled 2013-02-02 (×4): qty 0.2

## 2013-02-02 NOTE — H&P (Signed)
[facility] History and Physical  Avarey Wildt [id number] DOB: 12-29-1975 DOA: 02/02/2013  Referring physician: Ulla Gallo, D.O.  PCP: No primary provider on file.    Chief Complaint: Too much fluid on the kidneys  HPI: Karen [name] is a 37 y.o. female  Who was transferred from [facility] with concerns for acute renal failure necessitating a nephrology workup. Briefly, the patient has a history of poorly controlled as well as a history of "on and off" osteomyelitis of the left toe since 2009. In fact she was recently discharged from [facility] for treatment of osteomyelitis of the toe. There none hospital course she was treated with vancomycin and was discharged with Levaquin. During the hospital stay, the patient was noted to have evidence of acute renal failure with a creatinine of over 3, baseline creatinine of 1-2 per documentation. The patient was subsequently discharged from the hospital admission on 01/30/2013. Shortly afterwards, the patient noted gradually "swelling all over". At this time,the creatinine was found to be 4.1 with a calculated GFR of around 15. The patient showed signs of volume overload and was subsequently given a dose of IV Lasix. She was then transferred to [facility] for further management.  Review of Systems: The patient denies anorexia, fever, weight loss,, vision loss, decreased hearing, hoarseness, chest pain, syncope, dyspnea on exertion, peripheral edema, balance deficits, hemoptysis, abdominal pain, melena, hematochezia, severe indigestion/heartburn, hematuria, incontinence, genital sores, muscle weakness, suspicious skin lesions, transient blindness, difficulty walking, depression, unusual weight change, abnormal bleeding, enlarged lymph nodes, angioedema, and breast masses.  Swelling  Past Medical History  Diagnosis Date  . Diabetes mellitus    Past Surgical History  Procedure Laterality Date  . Cesarean section  09/21/2006    Social History:  reports that she has never smoked. She does not have any smokeless tobacco history on file. She reports that she does not drink alcohol or use illicit drugs.   Allergies  Allergen Reactions  . Morphine And Related Rash  . Penicillins Rash    No family history on file. hypertension diabetes (be sure to complete)  Prior to Admission medications   Medication Sig Start Date End Date Taking? Authorizing Provider  hydrALAZINE (APRESOLINE) 50 MG tablet Take 50 mg by mouth 2 (two) times daily.   Yes Historical Provider, MD  insulin glargine (LANTUS) 100 UNIT/ML injection Inject 20 Units into the skin at bedtime.    Yes Historical Provider, MD  levofloxacin (LEVAQUIN) 750 MG tablet Take 750 mg by mouth daily.   Yes Historical Provider, MD   Physical Exam: Filed Vitals:   02/02/13 1815  BP: 178/108  Pulse: 110  Temp: 98.2 F (36.8 C)  TempSrc: Oral  Resp: 20  Height: 5' 6.5" (1.689 m)  Weight: 113.7 kg (250 lb 10.6 oz)  SpO2: 90%     General:  The patient is awake alert oriented no apparent distress  Eyes: Pupils equal round reactive  ENT: Mucous murmurs moist  Neck: Trachea midline neck supple  Cardiovascular: Regular S1-S2  Respiratory: Normal respiratory effort no crackles no wheezing  Abdomen: Soft obese nontender  Skin: No rashes or lesions  Musculoskeletal: We'll perfused distally, no clubbing or cyanosis  Psychiatric: Normal  Neurologic: Nerves II through XII appear intact strength and sensation intact throughout  Labs on Admission:  Basic Metabolic Panel: No results found for this basename: NA, K, CL, CO2, GLUCOSE, BUN, CREATININE, CALCIUM, MG, PHOS,  in the last 168 hours Liver Function Tests: No results found for  this basename: AST, ALT, ALKPHOS, BILITOT, PROT, ALBUMIN,  in the last 168 hours No results found for this basename: LIPASE, AMYLASE,  in the last 168 hours No results found for this basename: AMMONIA,  in the last 168  hours CBC: No results found for this basename: WBC, NEUTROABS, HGB, HCT, MCV, PLT,  in the last 168 hours Cardiac Enzymes: No results found for this basename: CKTOTAL, CKMB, CKMBINDEX, TROPONINI,  in the last 168 hours  BNP (last 3 results) No results found for this basename: PROBNP,  in the last 8760 hours CBG: No results found for this basename: GLUCAP,  in the last 168 hours  Radiological Exams on Admission: No results found.   Assessment/Plan Active Problems:   * No active hospital problems. *   1. Acute versus chronic renal failure with a GFR of 15: As mentioned above, patient does show signs of volume overload on initial presentation. She has noted improvement with one dose of IV Lasix done at [facility]. Presently: Patient is hemodynamically stable. Given the degree of her renal failure, we will consult nephrology service for further recommendations. We will avoid nephrotoxic medications. We will also order a lateral renal ultrasound. In addition, we'll check HIV as well as hepatitis panel, as well as an ANA. 2. 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 For now, we'll continue patient with her reported home regimen. I will add a sliding scale regimen as well. We'll also check an A1c 3. Hypertension: Given history of diabetes, the patient would greatly benefit from an ACE inhibitor. However given her renal insufficiency, we will hold off for now. Instead, we'll continue the patient's hydralazine as per her home regimen and I add a beta blocker as she is also tachycardic as well. 4. Elevated BNP: Per report, the patient reportedly had a normal heart function as seen on a recent 2-D echocardiogram one month ago. Therefore, I suspect the patient's elevated BNP is likely secondary to renal failure instead. 5. Osteomyelitis of the left big toe: Supposedly, the patient is continued on oral Levaquin as an outpatient. We'll continue this regimen for the time being.  Outside records demonstrate a leukocytosis with a WBC count of around 15,000. She is presently afebrile. 6. DVT prophylaxis   Code Status: Full (must indicate code status--if unknown or must be presumed, indicate so) Family Communication: Patient and family at bedside (indicate person spoken with, if applicable, with phone number if by telephone) Disposition Plan: Pending (indicate anticipated LOS)  Time spent: 30 minutes  Chasyn Cinque, [facility] Pager [phone]  If 7PM-7AM, please contact night-coverage [email or web address] Password [facility] 02/02/2013, 7:27 PM

## 2013-02-02 NOTE — Progress Notes (Signed)
MD notified that patient has arrived to unit; will continue to monitor patient. Ruben Im RN

## 2013-02-02 NOTE — Consult Note (Signed)
Reason for Consult:AKI Referring Physician: [name] is an 37 y.o. female.  HPI: Pt is a 37yo AAF with PMH sig for longstanding, poorly-controlled IRDM, HTN, obesity, and chronic osteo of left great toe (since 2009).  She has been off and on IV and po antibiotics for the last 5 years and was most recently on Bactrim for 3 months until 01/25/13 when she presented to [facility] with c/o N/V/chills.  At that time she was noted to have a Scr of 3.4 and a K of 6.5.  Her bactrim was discontinued and placed on levaquin and treated with IVF's. She was discharged on Monday 01/30/13 and then developed worsening SOB and lower ext edema.  She was re-evaluated today at [facility] and her Scr was 4.1 and K of 4.6.  She was transferred to [facility] for further management.  We were consulted to help evaluate her acute/subacute kidney injury.  In notes from the OSH her baseline creatinine was noted to be 1.8-2 but her Scr was 1.03 2 years ago (see below).  She has been treated with multiple antibiotics, including Vancomycin.  She denies any NSAIDs/COX-II's or recent IV contrast.  She denies any family history or previous knowledge of her kidney disease.  Trend in Creatinine: Creatinine, Ser  Date/Time Value Range Status  12/04/2010  1:53 PM 1.03  0.4 - 1.2 mg/dL Final    PMH:   Past Medical History  Diagnosis Date  . HTN   . Obesity   . Medical noncompliance   . Chronic osteo   . Diabetes mellitus     PSH:   Past Surgical History  Procedure Laterality Date  . Cesarean section  09/21/2006    Allergies:  Allergies  Allergen Reactions  . Morphine And Related Rash  . Penicillins Rash    Medications:   Prior to Admission medications   Medication Sig Start Date End Date Taking? Authorizing Provider  hydrALAZINE (APRESOLINE) 50 MG tablet Take 50 mg by mouth 2 (two) times daily.   Yes Historical Provider, MD  insulin glargine (LANTUS) 100 UNIT/ML injection Inject 20 Units into the skin at bedtime.    Yes  Historical Provider, MD  levofloxacin (LEVAQUIN) 750 MG tablet Take 750 mg by mouth daily.   Yes Historical Provider, MD    Inpatient medications: . heparin  5,000 Units Subcutaneous Q8H  . hydrALAZINE  50 mg Oral BID  . [START ON 02/03/2013] insulin aspart  0-15 Units Subcutaneous TID WC  . insulin aspart  0-5 Units Subcutaneous QHS  . insulin glargine  20 Units Subcutaneous QHS  . levofloxacin  750 mg Oral Daily  . sodium chloride  3 mL Intravenous Q12H    Discontinued Meds:   Medications Discontinued During This Encounter  Medication Reason  . Saxagliptin-Metformin (KOMBIGLYZE XR) 5-500 MG TB24 Inpatient Standard  . furosemide (LASIX) 20 MG tablet     Social History:  reports that she has never smoked. She does not have any smokeless tobacco history on file. She reports that she does not drink alcohol or use illicit drugs.  Family History:  No family history on file.  Pertinent items are noted in HPI. Weight change:  No intake or output data in the 24 hours ending 02/02/13 2006 BP 178/108  Pulse 110  Temp(Src) 98.2 F (36.8 C) (Oral)  Resp 20  Ht 5' 6.5" (1.689 m)  Wt 113.7 kg (250 lb 10.6 oz)  BMI 39.86 kg/m2  SpO2 90% General appearance: alert, cooperative and no  distress Head: Normocephalic, without obvious abnormality, atraumatic Eyes: negative findings: lids and lashes normal, conjunctivae and sclerae normal and corneas clear Neck: no adenopathy, no carotid bruit, no JVD, supple, symmetrical, trachea midline and thyroid not enlarged, symmetric, no tenderness/mass/nodules Resp: diminished breath sounds bibasilar Cardio: regular rate and rhythm, S1, S2 normal, no murmur, click, rub or gallop GI: soft, non-tender; bowel sounds normal; no masses,  no organomegaly Extremities: edema trace pretibial edema L>R, enlarged left great toe with foul odor from crust on plantar surface of toe and no presacral edema  Labs: Basic Metabolic Panel: No results found for this  basename: NA, K, CL, CO2, GLUCOSE, BUN, CREATININE, ALBUMIN, CALCIUM, PHOS,  in the last 168 hours Liver Function Tests: No results found for this basename: AST, ALT, ALKPHOS, BILITOT, PROT, ALBUMIN,  in the last 168 hours No results found for this basename: LIPASE, AMYLASE,  in the last 168 hours No results found for this basename: AMMONIA,  in the last 168 hours CBC: No results found for this basename: WBC, NEUTROABS, HGB, HCT, MCV, PLT,  in the last 168 hours PT/INR: @LABRCNTIP (inr:5) Cardiac Enzymes: )No results found for this basename: CKTOTAL, CKMB, CKMBINDEX, TROPONINI,  in the last 168 hours CBG: No results found for this basename: GLUCAP,  in the last 168 hours  Iron Studies: No results found for this basename: IRON, TIBC, TRANSFERRIN, FERRITIN,  in the last 168 hours  Xrays/Other Studies: No results found.   Assessment/Plan: 1.  Acute/subacute kidney injury- unclear of exact time table for injury.  Her Scr has quadrupled over the last 2 years after multiple nephrotoxic antibiotics, ongoing infection, and poorly controlled diabetes and HTN.  Agree with Renal US and serologies by primary service.  Will also add urine eosinophils, complement levels, UA and start 24 hour Uprot for tomorrow morning.  DDx- nephrotoxic agents (vanco, bactrim), AIN, post-infectious GN, progressive diabetic nephropathy, hypertensive nephrosclerosis, or other GN.  No urgent indication for dialysis.  Will cont to follow. 2. CHF- agree with Lasix and restrict fluid and Na.  Would check ECHO as well.  May be related to urgent HTN/diastolic or systolic dysfunction/nephrotic syndrome.  Will follow 3. Anemia- last Hgb was 8.1.  Will check stool cards but likely due to chronic inflammation from nonhealing osteo. Also consider CKD or amyloid from chronic inflammation/infection. Will also check iron stores and SPEP/UPEP 4. Malignant HTN- recommend clonidine 0.1 bid and titrate to get SBP down.  Would hold off on  ACE/ARB until renal function improves.  Or consider Bidil.  Await ECHO to further tailor regimen. 5. Osteo- this has been chronic for 5 years and any chance of significant improvement seems unlikely.  Would recommend Ortho eval for possible amputation.  I discussed this with patient and family and they would be amenable to this. 6. Obesity 7. DM- per primary svc.  Has been poorly controlled with Hgb A1c's>13% 8. Dispo- per primary svc.     Karen [name] A 02/02/2013, 8:06 PM

## 2013-02-02 NOTE — Progress Notes (Signed)
Patient has arrived to unit from [facility], will continue to monitor patient.  Ruben Im RN

## 2013-02-02 NOTE — Telephone Encounter (Signed)
37 year old excepted from [facility] with shortness of breath for 2 days. Has a history of COPD, CHF and normal ejection fraction on echo this month. Decreased urine output. Hypoxic with sat 89%. Increases to 95 on 2 L. Creatinine has increased from 2-4 over the past few days. Chest x-ray shows bilateral pleural effusions. On vancomycin for osteomyelitis. Also has diabetes. Transferred to [facility] from the ER for nephrology and cardiology input.

## 2013-02-03 ENCOUNTER — Inpatient Hospital Stay (HOSPITAL_COMMUNITY): Payer: Medicaid Other

## 2013-02-03 DIAGNOSIS — R0989 Other specified symptoms and signs involving the circulatory and respiratory systems: Secondary | ICD-10-CM

## 2013-02-03 DIAGNOSIS — I472 Ventricular tachycardia, unspecified: Secondary | ICD-10-CM

## 2013-02-03 LAB — URINALYSIS, ROUTINE W REFLEX MICROSCOPIC
Glucose, UA: 250 mg/dL — AB
Leukocytes, UA: NEGATIVE
Protein, ur: >300 mg/dL — AB
pH: 6 (ref 5.0–8.0)

## 2013-02-03 LAB — HEPATITIS B SURFACE ANTIBODY,QUALITATIVE: Hep B S Ab: NONREACTIVE

## 2013-02-03 LAB — MPO/PR-3 (ANCA) ANTIBODIES
Myeloperoxidase Abs: 1 [AU]/ml
Serine Protease 3: 3 [AU]/ml

## 2013-02-03 LAB — C3 COMPLEMENT: C3 Complement: 152 mg/dL (ref 90–180)

## 2013-02-03 LAB — GLUCOSE, CAPILLARY
Glucose-Capillary: 104 mg/dL — ABNORMAL HIGH (ref 70–99)
Glucose-Capillary: 133 mg/dL — ABNORMAL HIGH (ref 70–99)
Glucose-Capillary: 105 mg/dL — ABNORMAL HIGH (ref 70–99)

## 2013-02-03 LAB — CBC WITH DIFFERENTIAL/PLATELET
Basophils Absolute: 0 K/uL (ref 0.0–0.1)
Basophils Relative: 0 % (ref 0–1)
Eosinophils Absolute: 0.5 K/uL (ref 0.0–0.7)
Eosinophils Relative: 4 % (ref 0–5)
HCT: 23.1 % — ABNORMAL LOW (ref 36.0–46.0)
Hemoglobin: 7.5 g/dL — ABNORMAL LOW (ref 12.0–15.0)
Lymphocytes Relative: 22 % (ref 12–46)
Lymphs Abs: 2.5 K/uL (ref 0.7–4.0)
MCH: 26.1 pg (ref 26.0–34.0)
MCHC: 32.5 g/dL (ref 30.0–36.0)
MCV: 80.5 fL (ref 78.0–100.0)
Monocytes Absolute: 1 K/uL (ref 0.1–1.0)
Monocytes Relative: 9 % (ref 3–12)
Neutro Abs: 7.5 K/uL (ref 1.7–7.7)
Neutrophils Relative %: 65 % (ref 43–77)
Platelets: 329 K/uL (ref 150–400)
RBC: 2.87 MIL/uL — ABNORMAL LOW (ref 3.87–5.11)
RDW: 17 % — ABNORMAL HIGH (ref 11.5–15.5)
WBC: 11.5 K/uL — ABNORMAL HIGH (ref 4.0–10.5)

## 2013-02-03 LAB — RENAL FUNCTION PANEL
CO2: 15 mEq/L — ABNORMAL LOW (ref 19–32)
Calcium: 8.2 mg/dL — ABNORMAL LOW (ref 8.4–10.5)
Creatinine, Ser: 4.58 mg/dL — ABNORMAL HIGH (ref 0.50–1.10)
Glucose, Bld: 120 mg/dL — ABNORMAL HIGH (ref 70–99)

## 2013-02-03 LAB — CREATININE, URINE, RANDOM: Creatinine, Urine: 70.45 mg/dL

## 2013-02-03 LAB — MAGNESIUM: Magnesium: 2.6 mg/dL — ABNORMAL HIGH (ref 1.5–2.5)

## 2013-02-03 LAB — TROPONIN I: Troponin I: <0.3 ng/mL (ref <0.30)

## 2013-02-03 LAB — URINE MICROSCOPIC-ADD ON

## 2013-02-03 LAB — C4 COMPLEMENT: Complement C4, Body Fluid: 47 mg/dL — ABNORMAL HIGH (ref 10–40)

## 2013-02-03 LAB — C-REACTIVE PROTEIN: CRP: 4.9 mg/dL — ABNORMAL HIGH (ref <0.60)

## 2013-02-03 LAB — HEPATITIS C ANTIBODY: HCV Ab: NEGATIVE

## 2013-02-03 LAB — ANTISTREPTOLYSIN O TITER: ASO: 520 [IU]/mL — ABNORMAL HIGH

## 2013-02-03 MED ORDER — METOPROLOL TARTRATE 12.5 MG HALF TABLET
12.5000 mg | ORAL_TABLET | Freq: Two times a day (BID) | ORAL | Status: DC
  Filled 2013-02-03 (×2): qty 1

## 2013-02-03 MED ORDER — FUROSEMIDE 40 MG PO TABS
40.0000 mg | ORAL_TABLET | Freq: Once | ORAL | Status: AC
  Administered 2013-02-03: 40 mg via ORAL
  Filled 2013-02-03: qty 1

## 2013-02-03 MED ORDER — ACETAMINOPHEN 325 MG PO TABS
650.0000 mg | ORAL_TABLET | Freq: Four times a day (QID) | ORAL | Status: DC | PRN
  Administered 2013-02-03 – 2013-02-06 (×4): 650 mg via ORAL
  Filled 2013-02-03 (×4): qty 2

## 2013-02-03 MED ORDER — FUROSEMIDE 10 MG/ML IJ SOLN
40.0000 mg | Freq: Two times a day (BID) | INTRAMUSCULAR | Status: DC
  Administered 2013-02-03 – 2013-02-07 (×9): 40 mg via INTRAVENOUS
  Filled 2013-02-03 (×11): qty 4

## 2013-02-03 MED ORDER — DARBEPOETIN ALFA-POLYSORBATE 100 MCG/0.5ML IJ SOLN
100.0000 ug | INTRAMUSCULAR | Status: DC
  Administered 2013-02-03: 100 ug via SUBCUTANEOUS
  Filled 2013-02-03 (×2): qty 0.5

## 2013-02-03 MED ORDER — VANCOMYCIN HCL 10 G IV SOLR
2000.0000 mg | Freq: Once | INTRAVENOUS | Status: AC
  Administered 2013-02-03: 2000 mg via INTRAVENOUS
  Filled 2013-02-03: qty 2000

## 2013-02-03 MED ORDER — SODIUM CHLORIDE 0.9 % IV SOLN
250.0000 mg | Freq: Four times a day (QID) | INTRAVENOUS | Status: DC
  Administered 2013-02-03 – 2013-02-07 (×12): 250 mg via INTRAVENOUS
  Filled 2013-02-03 (×22): qty 250

## 2013-02-03 MED ORDER — METOPROLOL TARTRATE 1 MG/ML IV SOLN
5.0000 mg | Freq: Once | INTRAVENOUS | Status: AC
  Administered 2013-02-03: 5 mg via INTRAVENOUS
  Filled 2013-02-03: qty 5

## 2013-02-03 MED ORDER — VANCOMYCIN HCL 10 G IV SOLR
1500.0000 mg | INTRAVENOUS | Status: DC
  Administered 2013-02-05 – 2013-02-07 (×2): 1500 mg via INTRAVENOUS
  Filled 2013-02-03 (×2): qty 1500

## 2013-02-03 NOTE — Progress Notes (Signed)
ANTIBIOTIC CONSULT NOTE - INITIAL  Pharmacy Consult for vancomycin and primaxin Indication: hx of toe osteomyelitis  Allergies  Allergen Reactions  . Morphine And Related Rash  . Penicillins Rash    Patient Measurements: Height: 5' 6.5" (168.9 cm) Weight: 249 lb 4.8 oz (113.082 kg) (scale c) IBW/kg (Calculated) : 60.45  Vital Signs: Temp: 97.5 F (36.4 C) (05/16 0928) Temp src: Oral (05/16 0928) BP: 141/89 mmHg (05/16 0928) Pulse Rate: 101 (05/16 0928) Intake/Output from previous day: 05/15 0701 - 05/16 0700 In: 363 [P.O.:360; I.V.:3] Out: 550 [Urine:550] Intake/Output from this shift: Total I/O In: 483 [P.O.:480; I.V.:3] Out: -   Labs:  Recent Labs  02/02/13 2254 02/03/13 0038 02/03/13 0039 02/03/13 0500  WBC 13.4*  --   --  11.5*  HGB 7.8*  --   --  7.5*  PLT 345  --   --  329  LABCREA  --  70.45 70.89  --   CREATININE 4.65*  --   --  4.58*   Estimated Creatinine Clearance: 22.1 ml/min (by C-G formula based on Cr of 4.58). No results found for this basename: VANCOTROUGH, Corlis Leak, VANCORANDOM, GENTTROUGH, GENTPEAK, GENTRANDOM, TOBRATROUGH, TOBRAPEAK, TOBRARND, AMIKACINPEAK, AMIKACINTROU, AMIKACIN,  in the last 72 hours   Microbiology: Recent Results (from the past 720 hour(s))  MRSA PCR SCREENING     Status: None   Collection Time    02/02/13  8:24 PM      Result Value Range Status   MRSA by PCR NEGATIVE  NEGATIVE Final   Comment:            The GeneXpert MRSA Assay (FDA     approved for NASAL specimens     only), is one component of a     comprehensive MRSA colonization     surveillance program. It is not     intended to diagnose MRSA     infection nor to guide or     monitor treatment for     MRSA infections.    Medical History: Past Medical History  Diagnosis Date  . Hypertension   . Anemia   . History of blood transfusion     "last week" (02/02/2013)  . GERD (gastroesophageal reflux disease)   . Osteomyelitis of toe of left foot    "off and on since 2009; no OR" (02/02/2013)  . Renal failure     acute vs chronic/notes 02/02/2013  . Type II diabetes mellitus 1995  . Exertional shortness of breath     "recently; it's fluid" (02/02/2013)    Assessment: 37 year old female transferred from [facility] last night. Patient has history of chronic left great toe osteo. Most recently she was being treated with levaquin. Noted VT/Vfib events overnight and abx have been changed to primaxin and vancomycin. Patient continues to be in renal failure, will adjust antibiotics accordingly.  Goal of Therapy:  Vancomycin trough level 15-20 mcg/ml  Plan:  Measure antibiotic drug levels at steady state Follow up culture results Vancomycin 2g iv now then 1500 q48 hours Primaxin 250mg  IV q6 hours  Erin Hearing PharmD., BCPS Clinical Pharmacist Pager [phone] 02/03/2013 10:21 AM

## 2013-02-03 NOTE — Progress Notes (Signed)
New orders given to place the Foley catheter only if the bladder scan showed more than 200 ccs of fluid in the bladder.  The patient is voiding and states that she does not have any obstructive issues.  The RN will carry out the orders and continue to monitor the patient.

## 2013-02-03 NOTE — Progress Notes (Signed)
The patient had several runs of V Tach and V Fib during the night.  The patient was asymptomatic .  The [facility] was notified and came to the floor to look at the strips on the monitor and see the patient.  Awaiting pending labs.  The RN will continue to monitor the patient.

## 2013-02-03 NOTE — Consult Note (Signed)
Orthopaedic Trauma Service Consult  Reason: Chronic Great Toe osteomyelitis Requesting: Karen Gu, MD  HPI:  37 year old black female transferred from [facility] hospital to [place] on 02/02/2013 4 concerns of acute renal failure. 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 She was recently hospitalized at [facility] for her osteo-where she was treated with vancomycin. She was discharged on Levaquin. He was during the hospital admission and she was noted to have acute renal failure. She was subsequently discharged and patient noticed that she was continuing to retain fluid and had signs of volume overload. She presented back to [facility] and subsequently was transferred to [place]. Orthopedics was consult and for her left great toe for the possibility of amputation given the chronicity of her symptoms  The patient is in room 4702. She states that she sustained a traumatic injury to her left toenail back in 2009 while she was at a nail salon. From that point forward she continued to have a chronic infection in her left toe with episodic flares. She be treated with oral antibiotics, would have resolution of symptoms, stop antibiotics and then her symptoms would return. She's been followed by [facility] podiatry here in town for this issue. She has never seen an orthopedist for this issue. Patient denies history of autoimmune disease, history of gout. No other arthropathies per report. At the current time patient denies any fevers or chills. No nausea or vomiting. She did have this at her initial presentation to [facility]. Patient denies any numbness or tingling in her left lower extremity. Denies any sensory changes in the bilateral lower extremities. She appears to be a fragile diabetic as well and is on insulin at home  Past Medical History  Diagnosis Date  . Hypertension   . Anemia   . History of blood transfusion    "last week" (02/02/2013)  . GERD (gastroesophageal reflux disease)   . Osteomyelitis of toe of left foot     "off and on since 2009; no OR" (02/02/2013)  . Renal failure     acute vs chronic/notes 02/02/2013  . Type II diabetes mellitus 1995  . Exertional shortness of breath     "recently; it's fluid" (02/02/2013)   Past Surgical History  Procedure Laterality Date  . Cesarean section  10/18/2006   History   Social History  . Marital Status: Single    Spouse Name: N/A    Number of Children: N/A  . Years of Education: N/A   Occupational History  . Not on file.   Social History Main Topics  . Smoking status: Former Smoker -- 3.00 packs/day for 10 years    Types: Cigarettes    Quit date: 09/21/2004  . Smokeless tobacco: Never Used  . Alcohol Use: No  . Drug Use: No  . Sexually Active: No   Other Topics Concern  . Not on file   Social History Narrative  . No narrative on file   Pt is unemployed. Worked at [employer] until march of 2013. Was attending community college but has not finished  Allergies  Allergen Reactions  . Morphine And Related Rash  . Penicillins Rash   Medications Prior to Admission  Medication Sig Dispense Refill  . hydrALAZINE (APRESOLINE) 50 MG tablet Take 50 mg by mouth 2 (two) times daily.      . insulin glargine (LANTUS) 100 UNIT/ML injection Inject 20 Units into the skin at bedtime.       Marland Kitchen  levofloxacin (LEVAQUIN) 750 MG tablet Take 750 mg by mouth daily.      . [DISCONTINUED] furosemide (LASIX) 20 MG tablet Take 20 mg by mouth daily.        I have reviewed inpatient medications  Review of Systems  Constitutional:       Presented to [facility] ED with fever and chills, none at current time   Respiratory: Negative for shortness of breath.   Cardiovascular: Negative for chest pain and palpitations.  Gastrointestinal: Negative for nausea, vomiting and abdominal pain.  Musculoskeletal:       L big toe pain   Neurological: Negative for headaches.    Exam  BP 147/94  Pulse 102  Temp(Src) 97.6 F (36.4 C) (Oral)  Resp 18  Ht 5' 6.5" (1.689 m)  Wt 113.082 kg (249 lb 4.8 oz)  BMI 39.64 kg/m2  SpO2 94%  LMP 01/20/2013  Physical Exam  Constitutional: She is oriented to person, place, and time. She is cooperative. No distress.  obese  Cardiovascular: Regular rhythm, S1 normal and S2 normal.   Slightly tachycardic   Pulmonary/Chest:  Dec breath sounds at bases   Abdominal:  Obese, + BS, NTND Old surgical scar   Musculoskeletal:  Left foot    Severe swelling of L great toe   + nail loss   Eschar medial plantar aspect of great toe   No frank drainage    TTP L great toe   Punctate lesions to feet B    + swelling to foot and ankle as well   + DP pulse   Cannot appreciate PT pulse   Heel stable   No wounds/sores noted elsewhere   Neurological: She is alert and oriented to person, place, and time.    Labs  Results for Karen, [name] (MRN [id number]) as of 02/03/2013 09:26  Ref. Range 02/03/2013 05:00  Sodium Latest Range: 135-145 mEq/L 136  Potassium Latest Range: 3.5-5.1 mEq/L 4.5  Chloride Latest Range: 96-112 mEq/L 107  CO2 Latest Range: 19-32 mEq/L 15 (L)  BUN Latest Range: 6-23 mg/dL 36 (H)  Creatinine Latest Range: 0.50-1.10 mg/dL 4.58 (H)  Calcium Latest Range: 8.4-10.5 mg/dL 8.2 (L)  GFR calc non Af Amer Latest Range: >90 mL/min 11 (L)  GFR calc Af Amer Latest Range: >90 mL/min 13 (L)  Glucose Latest Range: 70-99 mg/dL 120 (H)  Phosphorus Latest Range: 2.3-4.6 mg/dL 4.9 (H)  Magnesium Latest Range: 1.5-2.5 mg/dL 2.6 (H)  Albumin Latest Range: 3.5-5.2 g/dL 2.0 (L)  WBC Latest Range: 4.0-10.5 K/uL 11.5 (H)  RBC Latest Range: 3.87-5.11 MIL/uL 2.87 (L)  Hemoglobin Latest Range: 12.0-15.0 g/dL 7.5 (L)  HCT Latest Range: 36.0-46.0 % 23.1 (L)  MCV Latest Range: 78.0-100.0 fL 80.5  MCH Latest Range: 26.0-34.0 pg 26.1  MCHC Latest Range: 30.0-36.0 g/dL 32.5  RDW Latest Range: 11.5-15.5 % 17.0 (H)  Platelets Latest  Range: 150-400 K/uL 329  Neutrophils Relative % Latest Range: 43-77 % 65  Lymphocytes Relative Latest Range: 12-46 % 22  Monocytes Relative Latest Range: 3-12 % 9  Eosinophils Relative Latest Range: 0-5 % 4  Basophils Relative Latest Range: 0-1 % 0  NEUT# Latest Range: 1.7-7.7 K/uL 7.5  Lymphocytes Absolute Latest Range: 0.7-4.0 K/uL 2.5  Monocytes Absolute Latest Range: 0.1-1.0 K/uL 1.0  Eosinophils Absolute Latest Range: 0.0-0.7 K/uL 0.5  Basophils Absolute Latest Range: 0.0-0.1 K/uL 0.0    Assessment and Plan  37 y/o black female admitted for acute/subacute renal injury with chronic osteo L great toe  1.  Chronic osteo L great toe  Initial insult was from an apparent injury at the nail salon per pts report.  This happened back in 2009 and has been dealing with intermittent infection ever since.  Pt states that she would go on abx, the swelling would go away and then come back after discontinuation of abx.   Pt has been followed by [facility]  ABI's have been ordered   Order CRP and ESR- this will help monitor response to abx and tx   Will check plain films as well  Will order MRI of L foot as well given the chronicity of symptoms to ensure that there has been no additional spread   MRI will be w/o contrast given acute kidney injury so study may be limited in some regards  Will wait for all studies to return before proceeding to OR but do suspect amputation will be necessary. Pt understanding of this  2. VT  Cards evaluating   3. Acute/subacute kidney injury  Per Renal  4. CHF  Per medicine  5. HTN   6. Anemia  Likely that of chronic dz  Monitor 7. DM, obesity  8. ID  Continue with current abx   9. Dispo  Continue per primary service  Await studies to help potential surgical planning   [name], PA-C [facility] [phone] (P) 02/03/2013 9:59 AM

## 2013-02-03 NOTE — Progress Notes (Signed)
VASCULAR LAB PRELIMINARY  ARTERIAL  ABI completed:    RIGHT    LEFT    PRESSURE WAVEFORM  PRESSURE WAVEFORM  BRACHIAL 174  T BRACHIAL 169 T  DP   DP    AT 124  DM AT 178 DM  PT 94 DM PT 178 T  PER   PER    GREAT TOE  NA GREAT TOE  NA    RIGHT LEFT  ABI 0.71 >1.0     Jamal Haskin, RVT 02/03/2013, 2:13 PM

## 2013-02-03 NOTE — Progress Notes (Signed)
Subjective:  Some arrythmias overnight- BP overall better- adequate UOP Objective Vital signs in last 24 hours: Filed Vitals:   02/02/13 2203 02/03/13 0018 02/03/13 0531 02/03/13 0928  BP: 186/110 158/105 147/94 141/89  Pulse:  115 102 101  Temp:  97.6 F (36.4 C) 97.6 F (36.4 C) 97.5 F (36.4 C)  TempSrc:  Oral Oral Oral  Resp:  18 18 18   Height:      Weight:   113.082 kg (249 lb 4.8 oz)   SpO2:  92% 94% 94%   Weight change:   Intake/Output Summary (Last 24 hours) at 02/03/13 1008 Last data filed at 02/03/13 0931  Gross per 24 hour  Intake    846 ml  Output    550 ml  Net    296 ml   Labs: Basic Metabolic Panel:  Recent Labs Lab 02/02/13 2254 02/03/13 0500  NA  --  136  K  --  4.5  CL  --  107  CO2  --  15*  GLUCOSE  --  120*  BUN  --  36*  CREATININE 4.65* 4.58*  CALCIUM  --  8.2*  PHOS  --  4.9*   Liver Function Tests:  Recent Labs Lab 02/03/13 0500  ALBUMIN 2.0*   No results found for this basename: LIPASE, AMYLASE,  in the last 168 hours No results found for this basename: AMMONIA,  in the last 168 hours CBC:  Recent Labs Lab 02/02/13 2254 02/03/13 0500  WBC 13.4* 11.5*  NEUTROABS  --  7.5  HGB 7.8* 7.5*  HCT 24.1* 23.1*  MCV 80.6 80.5  PLT 345 329   Cardiac Enzymes:  Recent Labs Lab 02/03/13 0835  TROPONINI <0.30   CBG:  Recent Labs Lab 02/02/13 2153 02/03/13 0617  GLUCAP 158* 104*    Iron Studies: No results found for this basename: IRON, TIBC, TRANSFERRIN, FERRITIN,  in the last 72 hours Studies/Results: US Renal  02/02/2013   *RADIOLOGY REPORT*  Clinical Data: Renal failure  RENAL/URINARY TRACT ULTRASOUND COMPLETE  Comparison:  None.  Findings:  Right Kidney:  12.8 cm. No hydronephrosis or renal mass.  Left Kidney:  14.0 cm. No hydronephrosis or renal mass.  Bladder:  Not fully distended.  No gross abnormality.  IMPRESSION: No hydronephrosis.   Original Report Authenticated By: Genia Del, M.D.    Medications: Infusions:    Scheduled Medications: . heparin  5,000 Units Subcutaneous Q8H  . hydrALAZINE  50 mg Oral BID  . insulin aspart  0-15 Units Subcutaneous TID WC  . insulin aspart  0-5 Units Subcutaneous QHS  . insulin glargine  20 Units Subcutaneous QHS  . sodium chloride  3 mL Intravenous Q12H    have reviewed scheduled and prn medications.  Physical Exam: General: alert, says she feels better, NAD Heart: tachy Lungs: decreased BS at bases Abdomen: obese, soft Extremities: pitting edema   Assessment/ Plan: Pt is [name] 37 y.o. yo female with poorly controlled DM and HTN who was admitted on 02/02/2013 with continued management of subacute renal failure along with significant HTN, anemia and toe wound  Assessment/Plan: 1. Renal- 2 years ago normal renal function-most of work up is still pending-broad differential diagnosis list-  she seems to have nephrotic range proteinuria but minimal hematuria and it could be consistent only with poorly controlled DM and diabetic nephropathy vs something more ominous.  Reasonable UOP at this point and renal function is stable from last night, no indications for HD, not uremic. 2. Anemia-  iron stores pending- will start aranesp  3. HTN/vol- better - currently on hydralazine 50 BID only as is currently volume overloaded will add lasix as well.   4. Chronic osteo- primaxin and vanc appreciate ortho recs, likely will come to amputation   Karen [name]   02/03/2013,10:08 AM  LOS: 1 day

## 2013-02-03 NOTE — Progress Notes (Signed)
Utilization Review Completed.   Anaja Monts, RN, BSN Nurse Case Manager  [phone]  

## 2013-02-03 NOTE — Progress Notes (Addendum)
[facility] PROGRESS NOTE  Karen [name] [id number] DOB: [birth year] DOA: 02/02/2013 PCP: [name], MD  Assessment/Plan: Active Problems:   * No active hospital problems. *   Sustained ventricular tachycardia Patient is asymptomatic, electrolytes normal Given one dose of IV metoprolol, Discontinue levofloxacin Check cardiac enzymes 2-D echo Cardiology consultation from [facility] heart and vascular   Osteomyelitis Discontinued levofloxacin Switched to vancomycin/imipenem Consulted orthopedics, Dr. Marcelino Scot ABI pending  Diabetes Continue sliding scale insulin  Acute on chronic renal failure Renal ultrasound Complement levels Appreciate nephrology input   CHF Fluid restriction and Lasix 2-D echo pending   Hypertension Continue clonidine, hold off on ACE/ARB secondary to renal failure    Code Status: full Family Communication: family updated about patient's clinical progress Disposition Plan:  As above    Brief narrative: Karen [name] is a 37 y.o. female  Who was transferred from [facility] with concerns for acute renal failure necessitating a nephrology workup. Briefly, the patient has a history of poorly controlled as well as a history of "on and off" osteomyelitis of the left toe since 2009. In fact she was recently discharged from [facility] for treatment of osteomyelitis of the toe. There none hospital course she was treated with vancomycin and was discharged with Levaquin. During the hospital stay, the patient was noted to have evidence of acute renal failure with a creatinine of over 3, baseline creatinine of 1-2 per documentation. The patient was subsequently discharged from the hospital admission on 01/30/2013. Shortly afterwards, the patient noted gradually "swelling all over". At this time,the creatinine was found to be 4.1 with a calculated GFR of around 15. The patient showed signs of volume overload and was subsequently given a dose of  IV Lasix. She was then transferred to [facility] for further management   Consultants:     Procedures:     Antibiotics:  Levaquin discontinue  Vancomycin/imipenem starting 5/16  HPI/Subjective: Denies any chest pain any shortness of breath, episodes of ventricular tachycardia sustained on telemetry  Objective: Filed Vitals:   02/02/13 2012 02/02/13 2203 02/03/13 0018 02/03/13 0531  BP: 190/118 186/110 158/105 147/94  Pulse: 107  115 102  Temp: 98 F (36.7 C)  97.6 F (36.4 C) 97.6 F (36.4 C)  TempSrc: Oral  Oral Oral  Resp: 18  18 18   Height:      Weight:    113.082 kg (249 lb 4.8 oz)  SpO2: 94%  92% 94%    Intake/Output Summary (Last 24 hours) at 02/03/13 0820 Last data filed at 02/03/13 0600  Gross per 24 hour  Intake    363 ml  Output    550 ml  Net   -187 ml    Exam: General: The patient is awake alert oriented no apparent distress  Eyes: Pupils equal round reactive  ENT: Mucous murmurs moist  Neck: Trachea midline neck supple  Cardiovascular: Regular S1-S2  Respiratory: Normal respiratory effort no crackles no wheezing  Abdomen: Soft obese nontender  Skin: No rashes or lesions  Musculoskeletal: We'll perfused distally, no clubbing or cyanosis  Psychiatric: Normal  Neurologic: Nerves II through XII appear intact strength and sensation intact throughout    Data Reviewed: Basic Metabolic Panel:  Recent Labs Lab 02/02/13 2254 02/03/13 0500  NA  --  136  K  --  4.5  CL  --  107  CO2  --  15*  GLUCOSE  --  120*  BUN  --  36*  CREATININE 4.65* 4.58*  CALCIUM  --  8.2*  MG  --  2.6*  PHOS  --  4.9*    Liver Function Tests:  Recent Labs Lab 02/03/13 0500  ALBUMIN 2.0*   No results found for this basename: LIPASE, AMYLASE,  in the last 168 hours No results found for this basename: AMMONIA,  in the last 168 hours  CBC:  Recent Labs Lab 02/02/13 2254 02/03/13 0500  WBC 13.4* 11.5*  NEUTROABS  --  7.5  HGB 7.8* 7.5*   HCT 24.1* 23.1*  MCV 80.6 80.5  PLT 345 329    Cardiac Enzymes: No results found for this basename: CKTOTAL, CKMB, CKMBINDEX, TROPONINI,  in the last 168 hours BNP (last 3 results) No results found for this basename: PROBNP,  in the last 8760 hours   CBG:  Recent Labs Lab 02/02/13 2153 02/03/13 0617  GLUCAP 158* 104*    Recent Results (from the past 240 hour(s))  MRSA PCR SCREENING     Status: None   Collection Time    02/02/13  8:24 PM      Result Value Range Status   MRSA by PCR NEGATIVE  NEGATIVE Final   Comment:            The GeneXpert MRSA Assay (FDA     approved for NASAL specimens     only), is one component of a     comprehensive MRSA colonization     surveillance program. It is not     intended to diagnose MRSA     infection nor to guide or     monitor treatment for     MRSA infections.     Studies: US Renal  02/02/2013   *RADIOLOGY REPORT*  Clinical Data: Renal failure  RENAL/URINARY TRACT ULTRASOUND COMPLETE  Comparison:  None.  Findings:  Right Kidney:  12.8 cm. No hydronephrosis or renal mass.  Left Kidney:  14.0 cm. No hydronephrosis or renal mass.  Bladder:  Not fully distended.  No gross abnormality.  IMPRESSION: No hydronephrosis.   Original Report Authenticated By: Genia Del, M.D.    Scheduled Meds: . heparin  5,000 Units Subcutaneous Q8H  . hydrALAZINE  50 mg Oral BID  . insulin aspart  0-15 Units Subcutaneous TID WC  . insulin aspart  0-5 Units Subcutaneous QHS  . insulin glargine  20 Units Subcutaneous QHS  . metoprolol  5 mg Intravenous Once  . sodium chloride  3 mL Intravenous Q12H   Continuous Infusions:   Active Problems:   * No active hospital problems. *    Time spent: 40 minutes   [place] [facility] Pager [phone]. If 8PM-8AM, please contact night-coverage at [email or web address], password [facility] 02/03/2013, 8:20 AM  LOS: 1 day

## 2013-02-03 NOTE — Progress Notes (Signed)
Patient currently has no IV access, two IV nurses has attempted and were unsuccessful, MD notified and orders given for PICC Line placement; will continue to monitor patient. Ruben Im RN

## 2013-02-03 NOTE — Progress Notes (Signed)
Nutrition Brief Note  Patient identified on the Malnutrition Screening Tool (MST) Report for weight loss.  Per review of chart and discussion with patient, she has lost weight due to fluids. PO intake has been good.  Body mass index is 39.64 kg/(m^2). Patient meets criteria for class 2 obesity based on current BMI.   Current diet order is Renal 60/70-2-2, patient is tolerating well at this time. Labs and medications reviewed.   No nutrition interventions warranted at this time. If nutrition issues arise, please consult RD.   Molli Barrows, RD, LDN, [place] Pager [phone] After Hours Pager [phone]

## 2013-02-03 NOTE — Consult Note (Signed)
Reason for Consult: VT Referring Physician:   Cambryn Dock is an 37 y.o. female.  HPI:   The patient is a 37 yo morbidly obese female , who has never seen a cardiologist, with a history of HTN, DMII, remote tobacco use, anemia, OM of left foot.  She is here with acute renal failure and was seen at [facility] and treated for OM, volume overload and now ARF.  The patient states she feels better after the excess fluid was removed.  Her father died in 12/22/2001 from MI but apparently was taking a lot of drugs.  Her mother is deceased also and had a history of HTN and DM.     We are asked to consult for suspected VT.  At 0745 hrs the monitor showed what looks like NSVT or possibly Aflutter.  The patient thinks she may have been sitting on the edge of the bed at that time and remembers coughing and getting nauseated.  She also reports SOB and LEE of the left foot.  No CP, PND, orthopnea, dizziness, Abd pain, hematuria, hematochezia.    Past Medical History  Diagnosis Date  . Hypertension   . Anemia   . History of blood transfusion     "last week" (02/02/2013)  . GERD (gastroesophageal reflux disease)   . Osteomyelitis of toe of left foot     "off and on since 23-Dec-2007; no OR" (02/02/2013)  . Renal failure     acute vs chronic/notes 02/02/2013  . Type II diabetes mellitus 1995  . Exertional shortness of breath     "recently; it's fluid" (02/02/2013)    Past Surgical History  Procedure Laterality Date  . Cesarean section  10/18/2006    History reviewed. No pertinent family history.  Social History:  reports that she quit smoking about 8 years ago. Her smoking use included Cigarettes. She has a 30 pack-year smoking history. She has never used smokeless tobacco. She reports that she does not drink alcohol or use illicit drugs.  Allergies:  Allergies  Allergen Reactions  . Morphine And Related Rash  . Penicillins Rash    Medications:  Scheduled Meds: . heparin  5,000 Units Subcutaneous Q8H   . hydrALAZINE  50 mg Oral BID  . imipenem-cilastatin  250 mg Intravenous Q6H  . insulin aspart  0-15 Units Subcutaneous TID WC  . insulin aspart  0-5 Units Subcutaneous QHS  . insulin glargine  20 Units Subcutaneous QHS  . sodium chloride  3 mL Intravenous Q12H  . [START ON 02/05/2013] vancomycin  1,500 mg Intravenous Q48H  . vancomycin  2,000 mg Intravenous Once   Continuous Infusions:  PRN Meds:.acetaminophen, guaiFENesin-dextromethorphan, hydrALAZINE   Results for orders placed during the hospital encounter of 02/02/13 (from the past 48 hour(s))  MRSA PCR SCREENING     Status: None   Collection Time    02/02/13  8:24 PM      Result Value Range   MRSA by PCR NEGATIVE  NEGATIVE   Comment:            The GeneXpert MRSA Assay (FDA     approved for NASAL specimens     only), is one component of a     comprehensive MRSA colonization     surveillance program. It is not     intended to diagnose MRSA     infection nor to guide or     monitor treatment for     MRSA infections.  GLUCOSE, CAPILLARY  Status: Abnormal   Collection Time    02/02/13  9:53 PM      Result Value Range   Glucose-Capillary 158 (*) 70 - 99 mg/dL  CBC     Status: Abnormal   Collection Time    02/02/13 10:54 PM      Result Value Range   WBC 13.4 (*) 4.0 - 10.5 K/uL   RBC 2.99 (*) 3.87 - 5.11 MIL/uL   Hemoglobin 7.8 (*) 12.0 - 15.0 g/dL   HCT 24.1 (*) 36.0 - 46.0 %   MCV 80.6  78.0 - 100.0 fL   MCH 26.1  26.0 - 34.0 pg   MCHC 32.4  30.0 - 36.0 g/dL   RDW 16.9 (*) 11.5 - 15.5 %   Platelets 345  150 - 400 K/uL  CREATININE, SERUM     Status: Abnormal   Collection Time    02/02/13 10:54 PM      Result Value Range   Creatinine, Ser 4.65 (*) 0.50 - 1.10 mg/dL   GFR calc non Af Amer 11 (*) >90 mL/min   GFR calc Af Amer 13 (*) >90 mL/min   Comment:            The eGFR has been calculated     using the CKD EPI equation.     This calculation has not been     validated in all clinical     situations.      eGFR's persistently     <90 mL/min signify     possible Chronic Kidney Disease.  SODIUM, URINE, RANDOM     Status: None   Collection Time    02/03/13 12:38 AM      Result Value Range   Sodium, Ur 43    CREATININE, URINE, RANDOM     Status: None   Collection Time    02/03/13 12:38 AM      Result Value Range   Creatinine, Urine 70.45    URINALYSIS, ROUTINE W REFLEX MICROSCOPIC     Status: Abnormal   Collection Time    02/03/13 12:38 AM      Result Value Range   Color, Urine YELLOW  YELLOW   APPearance CLEAR  CLEAR   Specific Gravity, Urine 1.016  1.005 - 1.030   pH 6.0  5.0 - 8.0   Glucose, UA 250 (*) NEGATIVE mg/dL   Hgb urine dipstick MODERATE (*) NEGATIVE   Bilirubin Urine NEGATIVE  NEGATIVE   Ketones, ur NEGATIVE  NEGATIVE mg/dL   Protein, ur >300 (*) NEGATIVE mg/dL   Urobilinogen, UA 0.2  0.0 - 1.0 mg/dL   Nitrite NEGATIVE  NEGATIVE   Leukocytes, UA NEGATIVE  NEGATIVE  URINE MICROSCOPIC-ADD ON     Status: None   Collection Time    02/03/13 12:38 AM      Result Value Range   Squamous Epithelial / LPF RARE  RARE   RBC / HPF 3-6  <3 RBC/hpf   Bacteria, UA RARE  RARE  PROTEIN / CREATININE RATIO, URINE     Status: Abnormal   Collection Time    02/03/13 12:39 AM      Result Value Range   Creatinine, Urine 70.89     Total Protein, Urine 903     Comment: NO NORMAL RANGE ESTABLISHED FOR THIS TEST     RESULTS CONFIRMED BY MANUAL DILUTION   PROTEIN CREATININE RATIO 12.74 (*) 0.00 - 0.15  RENAL FUNCTION PANEL     Status: Abnormal   Collection  Time    02/03/13  5:00 AM      Result Value Range   Sodium 136  135 - 145 mEq/L   Potassium 4.5  3.5 - 5.1 mEq/L   Chloride 107  96 - 112 mEq/L   CO2 15 (*) 19 - 32 mEq/L   Glucose, Bld 120 (*) 70 - 99 mg/dL   BUN 36 (*) 6 - 23 mg/dL   Creatinine, Ser 4.58 (*) 0.50 - 1.10 mg/dL   Calcium 8.2 (*) 8.4 - 10.5 mg/dL   Phosphorus 4.9 (*) 2.3 - 4.6 mg/dL   Albumin 2.0 (*) 3.5 - 5.2 g/dL   GFR calc non Af Amer 11 (*) >90 mL/min    GFR calc Af Amer 13 (*) >90 mL/min   Comment:            The eGFR has been calculated     using the CKD EPI equation.     This calculation has not been     validated in all clinical     situations.     eGFR's persistently     <90 mL/min signify     possible Chronic Kidney Disease.  CBC WITH DIFFERENTIAL     Status: Abnormal   Collection Time    02/03/13  5:00 AM      Result Value Range   WBC 11.5 (*) 4.0 - 10.5 K/uL   RBC 2.87 (*) 3.87 - 5.11 MIL/uL   Hemoglobin 7.5 (*) 12.0 - 15.0 g/dL   HCT 23.1 (*) 36.0 - 46.0 %   MCV 80.5  78.0 - 100.0 fL   MCH 26.1  26.0 - 34.0 pg   MCHC 32.5  30.0 - 36.0 g/dL   RDW 17.0 (*) 11.5 - 15.5 %   Platelets 329  150 - 400 K/uL   Neutrophils Relative % 65  43 - 77 %   Neutro Abs 7.5  1.7 - 7.7 K/uL   Lymphocytes Relative 22  12 - 46 %   Lymphs Abs 2.5  0.7 - 4.0 K/uL   Monocytes Relative 9  3 - 12 %   Monocytes Absolute 1.0  0.1 - 1.0 K/uL   Eosinophils Relative 4  0 - 5 %   Eosinophils Absolute 0.5  0.0 - 0.7 K/uL   Basophils Relative 0  0 - 1 %   Basophils Absolute 0.0  0.0 - 0.1 K/uL  MAGNESIUM     Status: Abnormal   Collection Time    02/03/13  5:00 AM      Result Value Range   Magnesium 2.6 (*) 1.5 - 2.5 mg/dL  GLUCOSE, CAPILLARY     Status: Abnormal   Collection Time    02/03/13  6:17 AM      Result Value Range   Glucose-Capillary 104 (*) 70 - 99 mg/dL    US Renal  02/02/2013   *RADIOLOGY REPORT*  Clinical Data: Renal failure  RENAL/URINARY TRACT ULTRASOUND COMPLETE  Comparison:  None.  Findings:  Right Kidney:  12.8 cm. No hydronephrosis or renal mass.  Left Kidney:  14.0 cm. No hydronephrosis or renal mass.  Bladder:  Not fully distended.  No gross abnormality.  IMPRESSION: No hydronephrosis.   Original Report Authenticated By: Genia Del, M.D.    Review of Systems  Constitutional: Negative for fever and diaphoresis.  Respiratory: Positive for shortness of breath. Negative for cough.   Cardiovascular: Positive for leg  swelling (Left leg). Negative for chest pain, orthopnea and PND.  Gastrointestinal: Positive  for nausea (This morning around 0745hrs when sitting on edge of bed). Negative for vomiting, abdominal pain and blood in stool.  Genitourinary: Negative for hematuria.  Neurological: Negative for dizziness.   Blood pressure 147/94, pulse 102, temperature 97.6 F (36.4 C), temperature source Oral, resp. rate 18, height 5' 6.5" (1.689 m), weight 249 lb 4.8 oz (113.082 kg), last menstrual period 01/20/2013, SpO2 94.00%. Physical Exam  Constitutional: She is oriented to person, place, and time. She appears well-developed. No distress.  Morbidly obese  HENT:  Head: Normocephalic and atraumatic.  Eyes: EOM are normal. Pupils are equal, round, and reactive to light. No scleral icterus.  Neck: Normal range of motion. Neck supple. No JVD present.  Cardiovascular: Regular rhythm, S1 normal and S2 normal.  Tachycardia present.   No murmur heard. Pulses:      Radial pulses are 2+ on the right side, and 2+ on the left side.       Dorsalis pedis pulses are 1+ on the right side, and 0 on the left side.       Posterior tibial pulses are 1+ on the right side, and 1+ on the left side.  No Carotid Bruit.  Respiratory: Effort normal and breath sounds normal. She has no wheezes. She has no rales.  GI: Soft. Bowel sounds are normal. There is no tenderness.  Musculoskeletal: She exhibits edema.  1+ left pedal edema.  Lymphadenopathy:    She has no cervical adenopathy.  Neurological: She is alert and oriented to person, place, and time.  Skin: Skin is warm.  Psychiatric: She has a normal mood and affect.    Assessment/Plan: 1.  NSVT/Aflutter 2.  Obesity 3.  ARF 4.  HTN 5.  Osteomyelitis left toe 6.  DM  Plan:  One telemetry strip(0745hrs) looks like NSVT.  She was essentially asymptomatic.  Agree with IV lopressor.  BNP at [facility] was 39k. Further plan after evaluation of 2D echo which is pending.  She  appears euvolemic at this time but may need additional lasix.  EKG: NSR 99bpm, small inf q waves. MD to follow.   HAGER, BRYAN 02/03/2013, 9:11 AM   I have seen and examined the patient along with HAGER, BRYAN, PA.  I have reviewed the chart, notes and new data.  I agree with PA's note, except as outlined in the interpretation of the telemetry data.  I think that there is no true arrhythmia involved. There is a flutter like artifact on the baseline, on one occasion low amplitude (mimicking atrial flutter), on the other occasion very high amplitude, mimicking ventricular tachycardia/ventricular flutter at nearly 300 bpm. Both times, one can see that the underlying normal sinus rhythm continue unabated, at the same rate, before, during and after the  flutter-like artifact. The artifact was due to mechanical interference with the leads (coughing and retching). She did not experience palpitations or pre-syncope/syncope at the time.   PLAN: We will continue to follow the patient for evaluation and treatment of congestive heart failure. Despite her young age, she has a broad array of risk factors for CAD and CHF and remarkably severe complications of diabetes mellitus. Continued telemetry monitoring is indicated. The echo results will have a major influence on further cardiac workup.  Sanda Klein, MD, [place] [phone] 02/03/2013, 11:15 AM

## 2013-02-04 LAB — RENAL FUNCTION PANEL
Albumin: 1.9 g/dL — ABNORMAL LOW (ref 3.5–5.2)
Chloride: 108 mEq/L (ref 96–112)
Creatinine, Ser: 4.82 mg/dL — ABNORMAL HIGH (ref 0.50–1.10)
GFR calc non Af Amer: 11 mL/min — ABNORMAL LOW (ref >90)
Phosphorus: 5.4 mg/dL — ABNORMAL HIGH (ref 2.3–4.6)

## 2013-02-04 LAB — PROTEIN, URINE, 24 HOUR
Protein, 24H Urine: 13547 mg/d — ABNORMAL HIGH (ref 50–100)
Protein, Urine: 713 mg/dL
Urine Total Volume-UPROT: 1900 mL

## 2013-02-04 LAB — GLUCOSE, CAPILLARY: Glucose-Capillary: 88 mg/dL (ref 70–99)

## 2013-02-04 MED ORDER — NYSTATIN 100000 UNIT/GM EX CREA
TOPICAL_CREAM | Freq: Two times a day (BID) | CUTANEOUS | Status: DC
  Administered 2013-02-04 – 2013-02-05 (×3): via TOPICAL
  Administered 2013-02-06: 1 via TOPICAL
  Administered 2013-02-06 – 2013-02-09 (×6): via TOPICAL
  Filled 2013-02-04 (×4): qty 15

## 2013-02-04 MED ORDER — ALUM & MAG HYDROXIDE-SIMETH 200-200-20 MG/5ML PO SUSP
30.0000 mL | Freq: Once | ORAL | Status: AC
  Administered 2013-02-04: 30 mL via ORAL
  Filled 2013-02-04: qty 30

## 2013-02-04 MED ORDER — ONDANSETRON HCL 4 MG PO TABS
4.0000 mg | ORAL_TABLET | Freq: Three times a day (TID) | ORAL | Status: DC | PRN
  Administered 2013-02-04 – 2013-02-07 (×2): 4 mg via ORAL
  Filled 2013-02-04 (×2): qty 1

## 2013-02-04 MED ORDER — LEVALBUTEROL HCL 0.63 MG/3ML IN NEBU
0.6300 mg | INHALATION_SOLUTION | Freq: Three times a day (TID) | RESPIRATORY_TRACT | Status: DC | PRN
  Administered 2013-02-04 – 2013-02-09 (×2): 0.63 mg via RESPIRATORY_TRACT
  Filled 2013-02-04 (×2): qty 3

## 2013-02-04 MED ORDER — LORAZEPAM 0.5 MG PO TABS: 0.5000 mg | ORAL_TABLET | Freq: Once | ORAL | Status: DC

## 2013-02-04 MED ORDER — CALCIUM CARBONATE ANTACID 500 MG PO CHEW
1.0000 | CHEWABLE_TABLET | Freq: Every day | ORAL | Status: DC
  Administered 2013-02-04 – 2013-02-10 (×7): 200 mg via ORAL
  Filled 2013-02-04 (×11): qty 1

## 2013-02-04 MED ORDER — SODIUM CHLORIDE 0.9 % IJ SOLN: 10.0000 mL | INTRAMUSCULAR | Status: DC | PRN

## 2013-02-04 NOTE — Progress Notes (Signed)
[facility] Progress Note        Subjective   Doing ok Made NPO in hopes of getting to the OR today but unable to do so   Objective  BP 153/91  Pulse 102  Temp(Src) 97.7 F (36.5 C) (Oral)  Resp 20  Ht 5' 6.5" (1.689 m)  Wt 113.082 kg (249 lb 4.8 oz)  BMI 39.64 kg/m2  SpO2 98%  LMP 01/18/2013  Patient Vitals for the past 24 hrs:  BP Temp Temp src Pulse Resp SpO2  02/04/13 1000 153/91 mmHg - - - - -  02/04/13 0158 - - - - - 98 %  02/03/13 2038 172/102 mmHg 97.7 F (36.5 C) Oral 102 20 96 %  02/03/13 1605 152/91 mmHg 98.1 F (36.7 C) Oral 96 20 95 %    Intake/Output     05/16 0701 - 05/17 0700 05/17 0701 - 05/18 0700   P.O. 720 360   I.V. (mL/kg) 3 (0)    Total Intake(mL/kg) 723 (6.4) 360 (3.2)   Urine (mL/kg/hr) 1600 (0.6) 350 (0.5)   Total Output 1600 350   Net -877 +10          Labs  Results for Karen, [name] (MRN [id number]) as of 02/04/2013 13:53  Ref. Range 02/03/2013 05:00  Sed Rate Latest Range: 0-22 mm/hr >140 (H)   Results for Karen, [name] (MRN [id number]) as of 02/04/2013 13:53  Ref. Range 02/03/2013 05:00  CRP Latest Range: <0.60 mg/dL 4.9 (H)    Exam  Gen: awake and alert, NAD, friends in room  Ext:            Left Lower Extremity  Dressing in place   No change in exam    xrays- L foot  Progressive osteomyelitis with destruction of pathologic fracture  of the distal aspect of the proximal phalangeal bone  MRI  osteomyelitis throughout the great toe  without extension into the first MTP joint or first metatarsal  ABI    Summary: Right: ABI indicates a moderate reduction in arterial flow. Left: ABI is within normal limits with abnormal Doppler waveforms noted in the anterior tibial artery.   Assessment and Plan    37 y/o black female admitted for acute/subacute renal injury with chronic osteo L great toe  1. Chronic osteo L great toe         pt would benefit from amputation of 1st proximal phalanx  Will be put  on schedule for Monday  Continue with IV ABX   2. VT  Per cards            3. Acute/subacute kidney injury           Per Renal  4. CHF           Per medicine  5. HTN            6. Anemia           Likely that of chronic dz           Monitor 7. DM, obesity  8. ID           Continue with current abx            9. Dispo          OR Monday for L 1sth proximal phalanx amp  CHO modified diet   Karen Pigg, PA-C [facility] [phone] (P) 02/04/2013 1:53 PM

## 2013-02-04 NOTE — Progress Notes (Signed)
Peripherally Inserted Central Catheter/Midline Placement  The IV Nurse has discussed with the patient and/or persons authorized to consent for the patient, the purpose of this procedure and the potential benefits and risks involved with this procedure.  The benefits include less needle sticks, lab draws from the catheter and patient may be discharged home with the catheter.  Risks include, but not limited to, infection, bleeding, blood clot (thrombus formation), and puncture of an artery; nerve damage and irregular heat beat.  Alternatives to this procedure were also discussed.  PICC/Midline Placement Documentation  PICC / Midline Double Lumen [id number] PICC Right Basilic (Active)  Indication for Insertion or Continuance of Line Limited venous access - need for IV therapy >5 days (PICC only) 02/04/2013 11:36 AM  Length mark (cm) 0 cm 02/04/2013 11:36 AM  Site Assessment Clean;Dry;Intact 02/04/2013 11:36 AM  Lumen #1 Status Flushed;Saline locked;Blood return noted 02/04/2013 11:36 AM  Lumen #2 Status Flushed;Saline locked;Blood return noted 02/04/2013 11:36 AM  Dressing Type Transparent 02/04/2013 11:36 AM  Dressing Status Clean;Dry;Intact;Antimicrobial disc in place 02/04/2013 11:36 AM  Dressing Change Due 02/11/13 02/04/2013 11:36 AM       Samuella Cota 02/04/2013, 11:37 AM

## 2013-02-04 NOTE — Progress Notes (Addendum)
The [facility] was contacted for a po dose of furosemide since the patient had an IV furosemide order without any IV access.  New orders were given for a dose of 40 mg of po Lasix.  The RN will carry out the orders and continue to monitor the patient.

## 2013-02-04 NOTE — Progress Notes (Signed)
The patient was having some wheezing and anxiety.  The [facility] was notified and new orders were given for PRN Xopenex and a one time dose of Ativan.  The RN will carry out the orders and will continue to monitor the patient.

## 2013-02-04 NOTE — Progress Notes (Signed)
[facility] PROGRESS NOTE  Karen [name] [id number] DOB: 02-03-1976 DOA: 02/02/2013 PCP: [name], MD  Assessment/Plan: Active Problems:   * No active hospital problems. *     Sustained ventricular tachycardia  no true arrhythmia involved flutter-like artifact Patient is asymptomatic, electrolytes normal  Given one dose of IV metoprolol,  Discontinue levofloxacin  Check cardiac enzymes  2-D echo pending  Cardiology consultation from [facility] heart and vascular    Osteomyelitis  Discontinued levofloxacin  Switched to vancomycin/imipenem  Consulted orthopedics, Dr. Marcelino Scot  Right: ABI indicates a moderate reduction in arterial flow MRI shows osteomyelitis throughout the great toe   without extension into the first MTP joint or first metatarsal.  Skin ulceration with fluid tracking about the distal phalanx  compatible with abscess and septic IP joint of the great toe again  noted.    Diabetes  Continue sliding scale insulin    Acute on chronic renal failure  Renal ultrasound No hydronephrosis Renal fn worse today  Complement levels  Appreciate nephrology input    CHF  Fluid restriction and Lasix  2-D echo pending   Hypertension  Continue clonidine, hold off on ACE/ARB secondary to renal failure     Code Status: full  Family Communication: family updated about patient's clinical progress  Disposition Plan: As above    Brief narrative:  Karen [name] is a 37 y.o. female  Who was transferred from [facility] with concerns for acute renal failure necessitating a nephrology workup. Briefly, the patient has a history of poorly controlled as well as a history of "on and off" osteomyelitis of the left toe since 2009. In fact she was recently discharged from [facility] for treatment of osteomyelitis of the toe. There none hospital course she was treated with vancomycin and was discharged with Levaquin. During the hospital stay, the patient was  noted to have evidence of acute renal failure with a creatinine of over 3, baseline creatinine of 1-2 per documentation. The patient was subsequently discharged from the hospital admission on 01/30/2013. Shortly afterwards, the patient noted gradually "swelling all over". At this time,the creatinine was found to be 4.1 with a calculated GFR of around 15. The patient showed signs of volume overload and was subsequently given a dose of IV Lasix. She was then transferred to [facility] for further management  Consultants:  Procedures:  Antibiotics:  Levaquin discontinue  Vancomycin/imipenem starting 5/16 HPI/Subjective:  Denies any chest pain any shortness of breath, episodes of ventricular tachycardia sustained on telemetry   Objective: Filed Vitals:   02/03/13 0928 02/03/13 1605 02/03/13 2038 02/04/13 0158  BP: 141/89 152/91 172/102   Pulse: 101 96 102   Temp: 97.5 F (36.4 C) 98.1 F (36.7 C) 97.7 F (36.5 C)   TempSrc: Oral Oral Oral   Resp: 18 20 20    Height:      Weight:      SpO2: 94% 95% 96% 98%    Intake/Output Summary (Last 24 hours) at 02/04/13 0726 Last data filed at 02/03/13 2100  Gross per 24 hour  Intake    723 ml  Output   1000 ml  Net   -277 ml    Exam:  HENT:  Head: Atraumatic.  Nose: Nose normal.  Mouth/Throat: Oropharynx is clear and moist.  Eyes: Conjunctivae are normal. Pupils are equal, round, and reactive to light. No scleral icterus.  Neck: Neck supple. No tracheal deviation present.  Cardiovascular: Normal rate, regular rhythm, normal heart sounds and intact distal pulses.  Pulmonary/Chest: Effort normal and breath sounds normal. No respiratory distress.  Abdominal: Soft. Normal appearance and bowel sounds are normal. She exhibits no distension. There is no tenderness.  Musculoskeletal: She exhibits no edema and no tenderness.  Neurological: She is alert. No cranial nerve deficit.    Data Reviewed: Basic Metabolic Panel:  Recent  Labs Lab 02/02/13 2254 02/03/13 0500 02/04/13 0530  NA  --  136 135  K  --  4.5 4.4  CL  --  107 108  CO2  --  15* 16*  GLUCOSE  --  120* 103*  BUN  --  36* 38*  CREATININE 4.65* 4.58* 4.82*  CALCIUM  --  8.2* 8.4  MG  --  2.6*  --   PHOS  --  4.9* 5.4*    Liver Function Tests:  Recent Labs Lab 02/03/13 0500 02/04/13 0530  ALBUMIN 2.0* 1.9*   No results found for this basename: LIPASE, AMYLASE,  in the last 168 hours No results found for this basename: AMMONIA,  in the last 168 hours  CBC:  Recent Labs Lab 02/02/13 2254 02/03/13 0500  WBC 13.4* 11.5*  NEUTROABS  --  7.5  HGB 7.8* 7.5*  HCT 24.1* 23.1*  MCV 80.6 80.5  PLT 345 329    Cardiac Enzymes:  Recent Labs Lab 02/03/13 0835 02/03/13 1750 02/03/13 1959  TROPONINI <0.30 <0.30 <0.30   BNP (last 3 results) No results found for this basename: PROBNP,  in the last 8760 hours   CBG:  Recent Labs Lab 02/02/13 2153 02/03/13 0617 02/03/13 1131 02/03/13 1701 02/03/13 2116  GLUCAP 158* 104* 133* 127* 105*    Recent Results (from the past 240 hour(s))  MRSA PCR SCREENING     Status: None   Collection Time    02/02/13  8:24 PM      Result Value Range Status   MRSA by PCR NEGATIVE  NEGATIVE Final   Comment:            The GeneXpert MRSA Assay (FDA     approved for NASAL specimens     only), is one component of a     comprehensive MRSA colonization     surveillance program. It is not     intended to diagnose MRSA     infection nor to guide or     monitor treatment for     MRSA infections.     Studies: US Renal  02/02/2013   *RADIOLOGY REPORT*  Clinical Data: Renal failure  RENAL/URINARY TRACT ULTRASOUND COMPLETE  Comparison:  None.  Findings:  Right Kidney:  12.8 cm. No hydronephrosis or renal mass.  Left Kidney:  14.0 cm. No hydronephrosis or renal mass.  Bladder:  Not fully distended.  No gross abnormality.  IMPRESSION: No hydronephrosis.   Original Report Authenticated By: Genia Del,  M.D.   Mr Foot Left Wo Contrast  02/03/2013   *RADIOLOGY REPORT*  Clinical Data: Infection of the great toe.  MRI OF THE LEFT FOREFOOT WITHOUT CONTRAST  Technique:  Multiplanar, multisequence MR imaging was performed. No intravenous contrast was administered.  Comparison: Plain films the great toe this same date.  Findings: There is intense marrow edema throughout the great toe. The head of the proximal phalanx is fragmented and there is destructive change of the medial distal phalanx.  A large skin ulceration is seen on the medial side of the distal phalanx.  There is a small IP joint effusion with fluid tracking along the lateral aspect  of the distal phalanx.  Bone marrow signal in the metatarsal head is normal and there is no first MTP joint effusion.  Intrinsic musculature of the foot demonstrates age advanced atrophy.  No mass is seen.  IMPRESSION: Findings consistent with osteomyelitis throughout the great toe without extension into the first MTP joint or first metatarsal. Skin ulceration with fluid tracking about the distal phalanx compatible with abscess and septic IP joint of the great toe again noted.   Original Report Authenticated By: Orlean Patten, M.D.   Dg Foot Complete Left  02/03/2013   *RADIOLOGY REPORT*  Clinical Data: Diabetic with nonhealing osteomyelitis of the great toe.  LEFT FOOT - COMPLETE 3+ VIEW  Comparison: 01/25/2013.  Findings: Further osteolysis and destruction of the left first proximal phalanx distal aspect consistent with progressive osteomyelitis. Osteomyelitis also involving the left first distal phalanx with adjacent cellulitis/ soft tissue ulceration.  IMPRESSION: Progressive osteolysis/osteomyelitis left first toe as noted above.   Original Report Authenticated By: Genia Del, M.D.   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  LEFT GREAT TOE  Comparison: 02/03/2013, 01/25/2013 and 12/24/2011   Findings: There is progressive bone destruction of the distal aspect of the proximal phalangeal bone of the great toe with a pathologic fracture.  The patient has had previous resection of a portion of the distal phalangeal bone.  No other change.  IMPRESSION: Progressive osteomyelitis with destruction of pathologic fracture of the distal aspect of the proximal phalangeal bone. The bone appears rarefied toward the base.  I suspect osteomyelitis involves the entire proximal phalangeal bone.   Original Report Authenticated By: Lorriane Shire, M.D.    Scheduled Meds: . calcium carbonate  1 tablet Oral Q breakfast  . darbepoetin (ARANESP) injection - NON-DIALYSIS  100 mcg Subcutaneous Q Fri-1800  . furosemide  40 mg Intravenous BID  . heparin  5,000 Units Subcutaneous Q8H  . hydrALAZINE  50 mg Oral BID  . imipenem-cilastatin  250 mg Intravenous Q6H  . insulin aspart  0-15 Units Subcutaneous TID WC  . insulin aspart  0-5 Units Subcutaneous QHS  . insulin glargine  20 Units Subcutaneous QHS  . LORazepam  0.5 mg Oral Once  . sodium chloride  3 mL Intravenous Q12H  . [START ON 02/05/2013] vancomycin  1,500 mg Intravenous Q48H   Continuous Infusions:   Active Problems:   * No active hospital problems. *    Time spent: 40 minutes   [facility] Pager [phone]. If 8PM-8AM, please contact night-coverage at [email or web address], password [facility] 02/04/2013, 7:26 AM  LOS: 2 days

## 2013-02-04 NOTE — Progress Notes (Signed)
[facility]  Subjective: Pt feeling much better after diuresing. She is less short or breath and coughing has resolved. Denies CP.   Objective: Vital signs in last 24 hours: Temp:  [97.4 F (36.3 C)-98.1 F (36.7 C)] 97.4 F (36.3 C) (05/17 1459) Pulse Rate:  [96-102] 102 (05/17 1459) Resp:  [19-20] 19 (05/17 1459) BP: (144-172)/(91-102) 144/91 mmHg (05/17 1459) SpO2:  [95 %-98 %] 97 % (05/17 1459) Last BM Date: 02/02/13  Intake/Output from previous day: 05/16 0701 - 05/17 0700 In: 723 [P.O.:720; I.V.:3] Out: 1600 [Urine:1600] Intake/Output this shift: Total I/O In: 360 [P.O.:360] Out: 350 [Urine:350]  Medications Current Facility-Administered Medications  Medication Dose Route Frequency Provider Last Rate Last Dose  . acetaminophen (TYLENOL) tablet 650 mg  650 mg Oral Q6H PRN Dianne Dun, NP   650 mg at 02/03/13 0053  . calcium carbonate (TUMS - dosed in mg elemental calcium) chewable tablet 200 mg of elemental calcium  1 tablet Oral Q breakfast Dianne Dun, NP   200 mg of elemental calcium at 02/04/13 0631  . darbepoetin (ARANESP) injection 100 mcg  100 mcg Subcutaneous Q VN:[id number] Louis Meckel, MD   100 mcg at 02/03/13 1706  . furosemide (LASIX) injection 40 mg  40 mg Intravenous BID Louis Meckel, MD   40 mg at 02/04/13 1200  . guaiFENesin-dextromethorphan (ROBITUSSIN DM) 100-10 MG/5ML syrup 5 mL  5 mL Oral Q4H PRN Dianne Dun, NP   5 mL at 02/03/13 2340  . heparin injection 5,000 Units  5,000 Units Subcutaneous Q8H Donne Hazel, MD   5,000 Units at 02/04/13 0631  . hydrALAZINE (APRESOLINE) injection 10 mg  10 mg Intravenous Q6H PRN Dianne Dun, NP   10 mg at 02/03/13 0054  . hydrALAZINE (APRESOLINE) tablet 50 mg  50 mg Oral BID Donne Hazel, MD   50 mg at 02/04/13 1000  . imipenem-cilastatin (PRIMAXIN) 250 mg in sodium chloride 0.9 % 100 mL IVPB  250 mg Intravenous Q6H Georgina Peer, RPH   250 mg at 02/03/13 1123  . insulin aspart (novoLOG) injection 0-15 Units  0-15 Units Subcutaneous TID WC Donne Hazel, MD   2 Units at 02/03/13 1706  . insulin aspart (novoLOG) injection 0-5 Units  0-5 Units Subcutaneous QHS Donne Hazel, MD      . insulin glargine (LANTUS) injection 20 Units  20 Units Subcutaneous QHS Donne Hazel, MD   20 Units at 02/03/13 2201  . levalbuterol (XOPENEX) nebulizer solution 0.63 mg  0.63 mg Nebulization Q8H PRN Dianne Dun, NP   0.63 mg at 02/04/13 0157  . LORazepam (ATIVAN) tablet 0.5 mg  0.5 mg Oral Once Dianne Dun, NP      . ondansetron [facility]) tablet 4 mg  4 mg Oral Q8H PRN Dianne Dun, NP   4 mg at 02/04/13 0225  . sodium chloride 0.9 % injection 10-40 mL  10-40 mL Intracatheter PRN Reyne Dumas, MD      . sodium chloride 0.9 % injection 3 mL  3 mL Intravenous Q12H Donne Hazel, MD   3 mL at 02/03/13 0931  . [START ON 02/05/2013] vancomycin (VANCOCIN) 1,500 mg in sodium chloride 0.9 % 500 mL IVPB  1,500 mg Intravenous Q48H Georgina Peer, [name]        PE: General appearance: alert, cooperative, no distress and moderately obese Lungs: clear to auscultation bilaterally Heart: regular rate and rhythm Extremities:  no LEE Pulses: 2+ and symmetric Skin: warm and dry Neurologic: Grossly normal  Lab Results:   Recent Labs  02/02/13 2254 02/03/13 0500  WBC 13.4* 11.5*  HGB 7.8* 7.5*  HCT 24.1* 23.1*  PLT 345 329   BMET  Recent Labs  02/02/13 2254 02/03/13 0500 02/04/13 0530  NA  --  136 135  K  --  4.5 4.4  CL  --  107 108  CO2  --  15* 16*  GLUCOSE  --  120* 103*  BUN  --  36* 38*  CREATININE 4.65* 4.58* 4.82*  CALCIUM  --  8.2* 8.4    Studies/Results: 2D echo pending   Assessment/Plan  Plan: Net diuresis since admission 1.05 L. Pt doing subjectively better. Still awaiting 2D echo results. Troponins were negative x 3. She remains tachycardic with HR in the low 100s.  SCr continues to increase at 4.82 today. Will treat based on echo result. MD to follow with further recommendation.     LOS: 2 days    Brittainy M. Rosita Fire, PA-C 02/04/2013 3:31 PM  I have seen and examined the patient along with BRITTAINY M. SIMMONS, PA-C.  I have reviewed the chart, notes and new data.  I agree with PA's note.  Key new complaints: no further chest tightness or dyspnea at rest Key examination changes: tachycardic, but not obviously orthopneic Key new findings / data: no further arrhythmia by telemetry  PLAN: As mentioned, further recommendations depend heavily on echo, especially on presence or absence of LV systolic dysfunction and regional wall motion abnormalities.  Sanda Klein, MD, [place] [phone] 02/04/2013, 4:32 PM

## 2013-02-04 NOTE — Progress Notes (Signed)
The patient was having some N/V and was complaining of heartburn.  The [facility] was notified and new orders were given for PRN po Zofran and TUMS.  The RN will carry out the orders and will continue to monitor the patient.

## 2013-02-04 NOTE — Progress Notes (Addendum)
Subjective:  Events noted- MRI consistent with osteo- elevated ESR and CRP.  Serologies ordered by Dr. Loletha Grayer ...comp nml, HIV and hep neg, anti MP3 and protease neg.  Reasonable UOP, creatinine stable.  Lost IV access- now PICC.  Frustrated with hosp- NPO and doesn't know why  Objective Vital signs in last 24 hours: Filed Vitals:   02/03/13 0928 02/03/13 1605 02/03/13 2038 02/04/13 0158  BP: 141/89 152/91 172/102   Pulse: 101 96 102   Temp: 97.5 F (36.4 C) 98.1 F (36.7 C) 97.7 F (36.5 C)   TempSrc: Oral Oral Oral   Resp: 18 20 20    Height:      Weight:      SpO2: 94% 95% 96% 98%   Weight change:   Intake/Output Summary (Last 24 hours) at 02/04/13 0911 Last data filed at 02/04/13 0818  Gross per 24 hour  Intake    243 ml  Output   1600 ml  Net  -1357 ml   Labs: Basic Metabolic Panel:  Recent Labs Lab 02/02/13 2254 02/03/13 0500 02/04/13 0530  NA  --  136 135  K  --  4.5 4.4  CL  --  107 108  CO2  --  15* 16*  GLUCOSE  --  120* 103*  BUN  --  36* 38*  CREATININE 4.65* 4.58* 4.82*  CALCIUM  --  8.2* 8.4  PHOS  --  4.9* 5.4*   Liver Function Tests:  Recent Labs Lab 02/03/13 0500 02/04/13 0530  ALBUMIN 2.0* 1.9*   No results found for this basename: LIPASE, AMYLASE,  in the last 168 hours No results found for this basename: AMMONIA,  in the last 168 hours CBC:  Recent Labs Lab 02/02/13 2254 02/03/13 0500  WBC 13.4* 11.5*  NEUTROABS  --  7.5  HGB 7.8* 7.5*  HCT 24.1* 23.1*  MCV 80.6 80.5  PLT 345 329   Cardiac Enzymes:  Recent Labs Lab 02/03/13 0835 02/03/13 1750 02/03/13 1959  TROPONINI <0.30 <0.30 <0.30   CBG:  Recent Labs Lab 02/03/13 0617 02/03/13 1131 02/03/13 1701 02/03/13 2116 02/04/13 0724  GLUCAP 104* 133* 127* 105* 88    Iron Studies: No results found for this basename: IRON, TIBC, TRANSFERRIN, FERRITIN,  in the last 72 hours Studies/Results: US Renal  02/02/2013   *RADIOLOGY REPORT*  Clinical Data: Renal failure   RENAL/URINARY TRACT ULTRASOUND COMPLETE  Comparison:  None.  Findings:  Right Kidney:  12.8 cm. No hydronephrosis or renal mass.  Left Kidney:  14.0 cm. No hydronephrosis or renal mass.  Bladder:  Not fully distended.  No gross abnormality.  IMPRESSION: No hydronephrosis.   Original Report Authenticated By: Genia Del, M.D.   Mr Foot Left Wo Contrast  02/03/2013   *RADIOLOGY REPORT*  Clinical Data: Infection of the great toe.  MRI OF THE LEFT FOREFOOT WITHOUT CONTRAST  Technique:  Multiplanar, multisequence MR imaging was performed. No intravenous contrast was administered.  Comparison: Plain films the great toe this same date.  Findings: There is intense marrow edema throughout the great toe. The head of the proximal phalanx is fragmented and there is destructive change of the medial distal phalanx.  A large skin ulceration is seen on the medial side of the distal phalanx.  There is a small IP joint effusion with fluid tracking along the lateral aspect of the distal phalanx.  Bone marrow signal in the metatarsal head is normal and there is no first MTP joint effusion.  Intrinsic musculature of  the foot demonstrates age advanced atrophy.  No mass is seen.  IMPRESSION: Findings consistent with osteomyelitis throughout the great toe without extension into the first MTP joint or first metatarsal. Skin ulceration with fluid tracking about the distal phalanx compatible with abscess and septic IP joint of the great toe again noted.   Original Report Authenticated By: Orlean Patten, M.D.   Dg Foot Complete Left  02/03/2013   *RADIOLOGY REPORT*  Clinical Data: Diabetic with nonhealing osteomyelitis of the great toe.  LEFT FOOT - COMPLETE 3+ VIEW  Comparison: 01/25/2013.  Findings: Further osteolysis and destruction of the left first proximal phalanx distal aspect consistent with progressive osteomyelitis. Osteomyelitis also involving the left first distal phalanx with adjacent cellulitis/ soft tissue ulceration.   IMPRESSION: Progressive osteolysis/osteomyelitis left first toe as noted above.   Original Report Authenticated By: Genia Del, M.D.   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  LEFT GREAT TOE  Comparison: 02/03/2013, 01/25/2013 and 12/24/2011  Findings: There is progressive bone destruction of the distal aspect of the proximal phalangeal bone of the great toe with a pathologic fracture.  The patient has had previous resection of a portion of the distal phalangeal bone.  No other change.  IMPRESSION: Progressive osteomyelitis with destruction of pathologic fracture of the distal aspect of the proximal phalangeal bone. The bone appears rarefied toward the base.  I suspect osteomyelitis involves the entire proximal phalangeal bone.   Original Report Authenticated By: Lorriane Shire, M.D.   Medications: Infusions:    Scheduled Medications: . calcium carbonate  1 tablet Oral Q breakfast  . darbepoetin (ARANESP) injection - NON-DIALYSIS  100 mcg Subcutaneous Q Fri-1800  . furosemide  40 mg Intravenous BID  . heparin  5,000 Units Subcutaneous Q8H  . hydrALAZINE  50 mg Oral BID  . imipenem-cilastatin  250 mg Intravenous Q6H  . insulin aspart  0-15 Units Subcutaneous TID WC  . insulin aspart  0-5 Units Subcutaneous QHS  . insulin glargine  20 Units Subcutaneous QHS  . LORazepam  0.5 mg Oral Once  . sodium chloride  3 mL Intravenous Q12H  . [START ON 02/05/2013] vancomycin  1,500 mg Intravenous Q48H    have reviewed scheduled and prn medications.  Physical Exam: General: alert, says she feels better, NAD Heart: tachy Lungs: decreased BS at bases Abdomen: obese, soft Extremities: pitting edema   Assessment/ Plan: Pt is a 37 y.o. yo female with poorly controlled DM and HTN who was admitted on 02/02/2013 with continued management of subacute renal failure along with significant HTN, anemia and toe wound  Assessment/Plan: 1.  Renal- 2 years ago normal renal function-kidneys large with no hydro-broad differential diagnosis list given longstanding osteo and multiple antibiotic through the years-  She has nephrotic range proteinuria but minimal hematuria and it could be consistent only with poorly controlled DM and diabetic nephropathy vs something more ominous.  Reasonable UOP at this point and renal function is stable - no indications for HD, not uremic. 2. Anemia- iron stores not found, will order- on aranesp  3. HTN/vol- high -  on hydralazine 50 BID - having trouble with lasix admin due to no IV access- feel will be key component in getting BP under control. 4. Chronic osteo- primaxin and vanc appreciate ortho recs, likely will come to amputation   Adreana Coull A   02/04/2013,9:11 AM  LOS: 2 days

## 2013-02-04 NOTE — Progress Notes (Signed)
MD aware of cr. 4.82 and instructed team to insert PICC line as ordered.  IV team notified.

## 2013-02-05 DIAGNOSIS — I5041 Acute combined systolic (congestive) and diastolic (congestive) heart failure: Secondary | ICD-10-CM | POA: Diagnosis present

## 2013-02-05 DIAGNOSIS — E119 Type 2 diabetes mellitus without complications: Secondary | ICD-10-CM

## 2013-02-05 DIAGNOSIS — I739 Peripheral vascular disease, unspecified: Secondary | ICD-10-CM

## 2013-02-05 DIAGNOSIS — N179 Acute kidney failure, unspecified: Secondary | ICD-10-CM

## 2013-02-05 DIAGNOSIS — M869 Osteomyelitis, unspecified: Secondary | ICD-10-CM

## 2013-02-05 DIAGNOSIS — I509 Heart failure, unspecified: Secondary | ICD-10-CM

## 2013-02-05 DIAGNOSIS — I10 Essential (primary) hypertension: Secondary | ICD-10-CM

## 2013-02-05 DIAGNOSIS — I059 Rheumatic mitral valve disease, unspecified: Secondary | ICD-10-CM

## 2013-02-05 LAB — IRON AND TIBC
Iron: 31 ug/dL — ABNORMAL LOW (ref 42–135)
UIBC: 124 ug/dL — ABNORMAL LOW (ref 125–400)

## 2013-02-05 LAB — FERRITIN: Ferritin: 127 ng/mL (ref 10–291)

## 2013-02-05 LAB — RENAL FUNCTION PANEL
BUN: 40 mg/dL — ABNORMAL HIGH (ref 6–23)
Calcium: 8.7 mg/dL (ref 8.4–10.5)
Creatinine, Ser: 4.85 mg/dL — ABNORMAL HIGH (ref 0.50–1.10)
Glucose, Bld: 88 mg/dL (ref 70–99)
Phosphorus: 5.6 mg/dL — ABNORMAL HIGH (ref 2.3–4.6)

## 2013-02-05 LAB — GLUCOSE, CAPILLARY
Glucose-Capillary: 105 mg/dL — ABNORMAL HIGH (ref 70–99)
Glucose-Capillary: 120 mg/dL — ABNORMAL HIGH (ref 70–99)

## 2013-02-05 LAB — SURGICAL PCR SCREEN: MRSA, PCR: NEGATIVE

## 2013-02-05 MED ORDER — SODIUM CHLORIDE 0.9 % IJ SOLN
10.0000 mL | INTRAMUSCULAR | Status: DC | PRN
  Administered 2013-02-06 – 2013-02-08 (×4): 10 mL
  Administered 2013-02-08: 20 mL
  Administered 2013-02-08: 10 mL

## 2013-02-05 MED ORDER — SODIUM CHLORIDE 0.9 % IJ SOLN: 10.0000 mL | Freq: Two times a day (BID) | INTRAMUSCULAR | Status: DC

## 2013-02-05 MED ORDER — ALTEPLASE 100 MG IV SOLR
2.0000 mg | Freq: Once | INTRAVENOUS | Status: AC
  Administered 2013-02-05: 2 mg
  Filled 2013-02-05: qty 2

## 2013-02-05 MED ORDER — CALCIUM CARBONATE ANTACID 500 MG PO CHEW
1.0000 | CHEWABLE_TABLET | Freq: Three times a day (TID) | ORAL | Status: DC | PRN
  Administered 2013-02-05 – 2013-02-08 (×6): 200 mg via ORAL
  Filled 2013-02-05 (×4): qty 1

## 2013-02-05 NOTE — Progress Notes (Signed)
Subjective:  Feels better with diuresis.   Serologies ordered by Dr. Loletha Grayer ...comp nml, HIV and hep neg, anti MP3 and protease neg.  Good UOP on lasix, creatinine stable.  Plan is for toe amp tomorrow Objective Vital signs in last 24 hours: Filed Vitals:   02/04/13 1459 02/04/13 2022 02/05/13 0429 02/05/13 0554  BP: 144/91 172/105 144/70 149/86  Pulse: 102 107 103 100  Temp: 97.4 F (36.3 C) 97.5 F (36.4 C)  98.6 F (37 C)  TempSrc: Oral Oral  Oral  Resp: 19 20  20   Height:      Weight:    113.218 kg (249 lb 9.6 oz)  SpO2: 97% 94%  94%   Weight change:   Intake/Output Summary (Last 24 hours) at 02/05/13 0842 Last data filed at 02/05/13 0658  Gross per 24 hour  Intake   1120 ml  Output   2275 ml  Net  -1155 ml   Labs: Basic Metabolic Panel:  Recent Labs Lab 02/03/13 0500 02/04/13 0530 02/05/13 0327  NA 136 135 136  K 4.5 4.4 4.3  CL 107 108 107  CO2 15* 16* 19  GLUCOSE 120* 103* 88  BUN 36* 38* 40*  CREATININE 4.58* 4.82* 4.85*  CALCIUM 8.2* 8.4 8.7  PHOS 4.9* 5.4* 5.6*   Liver Function Tests:  Recent Labs Lab 02/03/13 0500 02/04/13 0530 02/05/13 0327  ALBUMIN 2.0* 1.9* 2.2*   No results found for this basename: LIPASE, AMYLASE,  in the last 168 hours No results found for this basename: AMMONIA,  in the last 168 hours CBC:  Recent Labs Lab 02/02/13 2254 02/03/13 0500  WBC 13.4* 11.5*  NEUTROABS  --  7.5  HGB 7.8* 7.5*  HCT 24.1* 23.1*  MCV 80.6 80.5  PLT 345 329   Cardiac Enzymes:  Recent Labs Lab 02/03/13 0835 02/03/13 1750 02/03/13 1959  TROPONINI <0.30 <0.30 <0.30   CBG:  Recent Labs Lab 02/04/13 0724 02/04/13 1201 02/04/13 1556 02/04/13 2116 02/05/13 0644  GLUCAP 88 70 94 101* 77    Iron Studies: No results found for this basename: IRON, TIBC, TRANSFERRIN, FERRITIN,  in the last 72 hours Studies/Results: Mr Foot Left Wo Contrast  02/03/2013   *RADIOLOGY REPORT*  Clinical Data: Infection of the great toe.  MRI OF THE LEFT  FOREFOOT WITHOUT CONTRAST  Technique:  Multiplanar, multisequence MR imaging was performed. No intravenous contrast was administered.  Comparison: Plain films the great toe this same date.  Findings: There is intense marrow edema throughout the great toe. The head of the proximal phalanx is fragmented and there is destructive change of the medial distal phalanx.  A large skin ulceration is seen on the medial side of the distal phalanx.  There is a small IP joint effusion with fluid tracking along the lateral aspect of the distal phalanx.  Bone marrow signal in the metatarsal head is normal and there is no first MTP joint effusion.  Intrinsic musculature of the foot demonstrates age advanced atrophy.  No mass is seen.  IMPRESSION: Findings consistent with osteomyelitis throughout the great toe without extension into the first MTP joint or first metatarsal. Skin ulceration with fluid tracking about the distal phalanx compatible with abscess and septic IP joint of the great toe again noted.   Original Report Authenticated By: Orlean Patten, M.D.   Dg Foot Complete Left  02/03/2013   *RADIOLOGY REPORT*  Clinical Data: Diabetic with nonhealing osteomyelitis of the great toe.  LEFT FOOT - COMPLETE 3+ VIEW  Comparison: 01/25/2013.  Findings: Further osteolysis and destruction of the left first proximal phalanx distal aspect consistent with progressive osteomyelitis. Osteomyelitis also involving the left first distal phalanx with adjacent cellulitis/ soft tissue ulceration.  IMPRESSION: Progressive osteolysis/osteomyelitis left first toe as noted above.   Original Report Authenticated By: Genia Del, M.D.   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  LEFT GREAT TOE  Comparison: 02/03/2013, 01/25/2013 and 12/24/2011  Findings: There is progressive bone destruction of the distal aspect of the proximal phalangeal bone of the great toe with a  pathologic fracture.  The patient has had previous resection of a portion of the distal phalangeal bone.  No other change.  IMPRESSION: Progressive osteomyelitis with destruction of pathologic fracture of the distal aspect of the proximal phalangeal bone. The bone appears rarefied toward the base.  I suspect osteomyelitis involves the entire proximal phalangeal bone.   Original Report Authenticated By: Lorriane Shire, M.D.   Medications: Infusions:    Scheduled Medications: . calcium carbonate  1 tablet Oral Q breakfast  . darbepoetin (ARANESP) injection - NON-DIALYSIS  100 mcg Subcutaneous Q Fri-1800  . furosemide  40 mg Intravenous BID  . heparin  5,000 Units Subcutaneous Q8H  . hydrALAZINE  50 mg Oral BID  . imipenem-cilastatin  250 mg Intravenous Q6H  . insulin aspart  0-15 Units Subcutaneous TID WC  . insulin aspart  0-5 Units Subcutaneous QHS  . insulin glargine  20 Units Subcutaneous QHS  . LORazepam  0.5 mg Oral Once  . nystatin cream   Topical BID  . sodium chloride  3 mL Intravenous Q12H  . vancomycin  1,500 mg Intravenous Q48H    have reviewed scheduled and prn medications.  Physical Exam: General: alert, says she feels better, NAD Heart: tachy Lungs: decreased BS at bases Abdomen: obese, soft Extremities: pitting edema still but better   Assessment/ Plan: Pt is a 37 y.o. yo female with poorly controlled DM and HTN who was admitted on 02/02/2013 with continued management of subacute renal failure along with significant HTN, anemia and toe wound  Assessment/Plan: 1. Renal- 2 years ago normal renal function-kidneys large with no hydro-broad differential diagnosis list given longstanding osteo and multiple antibiotic through the years-  She has nephrotic range proteinuria but minimal hematuria and it could be consistent only with poorly controlled DM and diabetic nephropathy vs something more ominous.  Good UOP on lasix and stable creatinine. - no indications for HD, not  uremic.  Since pt is so young and serologies non revealing I think would be worthwhile to get renal biopsy to see if there is anything reversible which may respond to treatment.  I will discuss this with Dr. Posey Pronto who is coming on service starting tomorrow 2. Anemia- iron stores pending- on aranesp  3. HTN/vol- high -  on hydralazine 50 BID - Now on lasix with diuresis and is improving.  Would continue moderate diuresis 4. Chronic osteo- primaxin and vanc appreciate ortho recs, amputation tomorrow   Jobani Sabado A   02/05/2013,8:42 AM  LOS: 3 days

## 2013-02-05 NOTE — Progress Notes (Signed)
[facility] PROGRESS NOTE  Karen [name] [id number] DOB: [birth year] DOA: 02/02/2013 PCP: [name], MD  Assessment/Plan: Active Problems:   * No active hospital problems. *   Sustained ventricular tachycardia  no true arrhythmia involved  flutter-like artifact  Patient is asymptomatic, electrolytes normal  Given one dose of IV metoprolol,  Discontinue levofloxacin  Check cardiac enzymes  2-D echo pending  Cardiology consultation from [facility] heart and vascular  She remains tachycardic with HR in the low 100s   Osteomyelitis  Discontinued levofloxacin  Switched to vancomycin/imipenem  Consulted orthopedics, Dr. Marcelino Scot  Right: ABI indicates a moderate reduction in arterial flow  MRI shows osteomyelitis throughout the great toe  without extension into the first MTP joint or first metatarsal.  Skin ulceration with fluid tracking about the distal phalanx  compatible with abscess and septic IP joint of the great toe again  noted.   Diabetes  Continue sliding scale insulin   Acute on chronic renal failure  Renal ultrasound No hydronephrosis  Renal fn slightly worse today  Complement levels normal Appreciate nephrology input  comp nml, HIV and hep neg, anti MP3 and protease neg   CHF  Fluid restriction and Lasix  2-D echo pending   Hypertension  Continue clonidine, hold off on ACE/ARB secondary to renal failure  on hydralazine 50 BID    Code Status: full  Family Communication: family updated about patient's clinical progress  Disposition Plan: As above   Brief narrative:  Karen [name] is a 37 y.o. female  Who was transferred from [facility] with concerns for acute renal failure necessitating a nephrology workup. Briefly, the patient has a history of poorly controlled as well as a history of "on and off" osteomyelitis of the left toe since 2009. In fact she was recently discharged from [facility] for treatment of osteomyelitis of the toe.  There none hospital course she was treated with vancomycin and was discharged with Levaquin. During the hospital stay, the patient was noted to have evidence of acute renal failure with a creatinine of over 3, baseline creatinine of 1-2 per documentation. The patient was subsequently discharged from the hospital admission on 01/30/2013. Shortly afterwards, the patient noted gradually "swelling all over". At this time,the creatinine was found to be 4.1 with a calculated GFR of around 15. The patient showed signs of volume overload and was subsequently given a dose of IV Lasix. She was then transferred to [facility] for further management  Consultants:  Procedures:  Antibiotics:  Levaquin discontinue  Vancomycin/imipenem starting 5/16 HPI/Subjective:  Denies any chest pain any shortness of breath,   Objective: Filed Vitals:   02/04/13 1459 02/04/13 2022 02/05/13 0429 02/05/13 0554  BP: 144/91 172/105 144/70 149/86  Pulse: 102 107 103 100  Temp: 97.4 F (36.3 C) 97.5 F (36.4 C)  98.6 F (37 C)  TempSrc: Oral Oral  Oral  Resp: 19 20  20   Height:      Weight:    113.218 kg (249 lb 9.6 oz)  SpO2: 97% 94%  94%    Intake/Output Summary (Last 24 hours) at 02/05/13 0736 Last data filed at 02/05/13 [id number]  Gross per 24 hour  Intake   1120 ml  Output   2275 ml  Net  -1155 ml    Exam: General: alert, says she feels better, NAD  Heart: tachy  Lungs: decreased BS at bases  Abdomen: obese, soft  Extremities: pitting edema    Data Reviewed: Basic Metabolic Panel:  Recent Labs  Lab 02/02/13 2254 02/03/13 0500 02/04/13 0530 02/05/13 0327  NA  --  136 135 136  K  --  4.5 4.4 4.3  CL  --  107 108 107  CO2  --  15* 16* 19  GLUCOSE  --  120* 103* 88  BUN  --  36* 38* 40*  CREATININE 4.65* 4.58* 4.82* 4.85*  CALCIUM  --  8.2* 8.4 8.7  MG  --  2.6*  --   --   PHOS  --  4.9* 5.4* 5.6*    Liver Function Tests:  Recent Labs Lab 02/03/13 0500 02/04/13 0530 02/05/13 0327   ALBUMIN 2.0* 1.9* 2.2*   No results found for this basename: LIPASE, AMYLASE,  in the last 168 hours No results found for this basename: AMMONIA,  in the last 168 hours  CBC:  Recent Labs Lab 02/02/13 2254 02/03/13 0500  WBC 13.4* 11.5*  NEUTROABS  --  7.5  HGB 7.8* 7.5*  HCT 24.1* 23.1*  MCV 80.6 80.5  PLT 345 329    Cardiac Enzymes:  Recent Labs Lab 02/03/13 0835 02/03/13 1750 02/03/13 1959  TROPONINI <0.30 <0.30 <0.30   BNP (last 3 results) No results found for this basename: PROBNP,  in the last 8760 hours   CBG:  Recent Labs Lab 02/04/13 0724 02/04/13 1201 02/04/13 1556 02/04/13 2116 02/05/13 0644  GLUCAP 88 70 94 101* 77    Recent Results (from the past 240 hour(s))  MRSA PCR SCREENING     Status: None   Collection Time    02/02/13  8:24 PM      Result Value Range Status   MRSA by PCR NEGATIVE  NEGATIVE Final   Comment:            The GeneXpert MRSA Assay (FDA     approved for NASAL specimens     only), is one component of a     comprehensive MRSA colonization     surveillance program. It is not     intended to diagnose MRSA     infection nor to guide or     monitor treatment for     MRSA infections.     Studies: US Renal  02/02/2013   *RADIOLOGY REPORT*  Clinical Data: Renal failure  RENAL/URINARY TRACT ULTRASOUND COMPLETE  Comparison:  None.  Findings:  Right Kidney:  12.8 cm. No hydronephrosis or renal mass.  Left Kidney:  14.0 cm. No hydronephrosis or renal mass.  Bladder:  Not fully distended.  No gross abnormality.  IMPRESSION: No hydronephrosis.   Original Report Authenticated By: Genia Del, M.D.   Mr Foot Left Wo Contrast  02/03/2013   *RADIOLOGY REPORT*  Clinical Data: Infection of the great toe.  MRI OF THE LEFT FOREFOOT WITHOUT CONTRAST  Technique:  Multiplanar, multisequence MR imaging was performed. No intravenous contrast was administered.  Comparison: Plain films the great toe this same date.  Findings: There is intense  marrow edema throughout the great toe. The head of the proximal phalanx is fragmented and there is destructive change of the medial distal phalanx.  A large skin ulceration is seen on the medial side of the distal phalanx.  There is a small IP joint effusion with fluid tracking along the lateral aspect of the distal phalanx.  Bone marrow signal in the metatarsal head is normal and there is no first MTP joint effusion.  Intrinsic musculature of the foot demonstrates age advanced atrophy.  No mass is seen.  IMPRESSION: Findings  consistent with osteomyelitis throughout the great toe without extension into the first MTP joint or first metatarsal. Skin ulceration with fluid tracking about the distal phalanx compatible with abscess and septic IP joint of the great toe again noted.   Original Report Authenticated By: Orlean Patten, M.D.   Dg Foot Complete Left  02/03/2013   *RADIOLOGY REPORT*  Clinical Data: Diabetic with nonhealing osteomyelitis of the great toe.  LEFT FOOT - COMPLETE 3+ VIEW  Comparison: 01/25/2013.  Findings: Further osteolysis and destruction of the left first proximal phalanx distal aspect consistent with progressive osteomyelitis. Osteomyelitis also involving the left first distal phalanx with adjacent cellulitis/ soft tissue ulceration.  IMPRESSION: Progressive osteolysis/osteomyelitis left first toe as noted above.   Original Report Authenticated By: Genia Del, M.D.   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  LEFT GREAT TOE  Comparison: 02/03/2013, 01/25/2013 and 12/24/2011  Findings: There is progressive bone destruction of the distal aspect of the proximal phalangeal bone of the great toe with a pathologic fracture.  The patient has had previous resection of a portion of the distal phalangeal bone.  No other change.  IMPRESSION: Progressive osteomyelitis with destruction of pathologic fracture of the distal aspect  of the proximal phalangeal bone. The bone appears rarefied toward the base.  I suspect osteomyelitis involves the entire proximal phalangeal bone.   Original Report Authenticated By: Lorriane Shire, M.D.    Scheduled Meds: . calcium carbonate  1 tablet Oral Q breakfast  . darbepoetin (ARANESP) injection - NON-DIALYSIS  100 mcg Subcutaneous Q Fri-1800  . furosemide  40 mg Intravenous BID  . heparin  5,000 Units Subcutaneous Q8H  . hydrALAZINE  50 mg Oral BID  . imipenem-cilastatin  250 mg Intravenous Q6H  . insulin aspart  0-15 Units Subcutaneous TID WC  . insulin aspart  0-5 Units Subcutaneous QHS  . insulin glargine  20 Units Subcutaneous QHS  . LORazepam  0.5 mg Oral Once  . nystatin cream   Topical BID  . sodium chloride  3 mL Intravenous Q12H  . vancomycin  1,500 mg Intravenous Q48H   Continuous Infusions:   Active Problems:   * No active hospital problems. *    Time spent: 40 minutes   [facility] Pager [phone]. If 8PM-8AM, please contact night-coverage at [email or web address], password [facility] 02/05/2013, 7:36 AM  LOS: 3 days

## 2013-02-05 NOTE — Progress Notes (Signed)
[facility]  DAILY PROGRESS NOTE   Subjective:  More diuresis overnight. She is now out ~2.2L.  Creatinine is stable at 4.85.  Echo was not performed yesterday as ordered.  Objective:  Temp:  [97.4 F (36.3 C)-98.6 F (37 C)] 97.4 F (36.3 C) (05/18 0846) Pulse Rate:  [100-107] 106 (05/18 0846) Resp:  [19-20] 20 (05/18 0846) BP: (132-172)/(70-105) 132/86 mmHg (05/18 0846) SpO2:  [94 %-97 %] 95 % (05/18 0846) Weight:  [249 lb 9.6 oz (113.218 kg)] 249 lb 9.6 oz (113.218 kg) (05/18 0554) Weight change:   Intake/Output from previous day: 05/17 0701 - 05/18 0700 In: 1120 [P.O.:920; IV Piggyback:200] Out: 2275 [Urine:2275]  Intake/Output from this shift:    Medications: Current Facility-Administered Medications  Medication Dose Route Frequency Provider Last Rate Last Dose  . acetaminophen (TYLENOL) tablet 650 mg  650 mg Oral Q6H PRN Dianne Dun, NP   650 mg at 02/04/13 2208  . calcium carbonate (TUMS - dosed in mg elemental calcium) chewable tablet 200 mg of elemental calcium  1 tablet Oral Q breakfast Dianne Dun, NP   200 mg of elemental calcium at 02/05/13 0647  . calcium carbonate (TUMS - dosed in mg elemental calcium) chewable tablet 200 mg of elemental calcium  1 tablet Oral TID PRN Dianne Dun, NP   200 mg of elemental calcium at 02/05/13 0421  . darbepoetin (ARANESP) injection 100 mcg  100 mcg Subcutaneous Q [id number] Louis Meckel, MD   100 mcg at 02/03/13 1706  . furosemide (LASIX) injection 40 mg  40 mg Intravenous BID Louis Meckel, MD   40 mg at 02/04/13 2154  . guaiFENesin-dextromethorphan (ROBITUSSIN DM) 100-10 MG/5ML syrup 5 mL  5 mL Oral Q4H PRN Dianne Dun, NP   5 mL at 02/04/13 2206  . heparin injection 5,000 Units  5,000 Units Subcutaneous Q8H Donne Hazel, MD   5,000 Units at 02/05/13 [phone]  . hydrALAZINE (APRESOLINE) injection 10 mg  10 mg Intravenous Q6H PRN Dianne Dun, NP   10 mg at 02/03/13 0054  . hydrALAZINE (APRESOLINE) tablet 50 mg  50 mg Oral BID Donne Hazel, MD   50 mg at 02/04/13 2153  . imipenem-cilastatin (PRIMAXIN) 250 mg in sodium chloride 0.9 % 100 mL IVPB  250 mg Intravenous Q6H Georgina Peer, RPH   250 mg at 02/05/13 [id number]  . insulin aspart (novoLOG) injection 0-15 Units  0-15 Units Subcutaneous TID WC Donne Hazel, MD   2 Units at 02/03/13 1706  . insulin aspart (novoLOG) injection 0-5 Units  0-5 Units Subcutaneous QHS Donne Hazel, MD      . insulin glargine (LANTUS) injection 20 Units  20 Units Subcutaneous QHS Donne Hazel, MD   20 Units at 02/03/13 2201  . levalbuterol (XOPENEX) nebulizer solution 0.63 mg  0.63 mg Nebulization Q8H PRN Dianne Dun, NP   0.63 mg at 02/04/13 0157  . LORazepam (ATIVAN) tablet 0.5 mg  0.5 mg Oral Once Dianne Dun, NP      . nystatin cream (MYCOSTATIN)   Topical BID Reyne Dumas, MD      . ondansetron [facility]) tablet 4 mg  4 mg Oral Q8H PRN Dianne Dun, NP   4 mg at 02/04/13 0225  . sodium chloride 0.9 % injection 10-40 mL  10-40 mL Intracatheter PRN Reyne Dumas, MD      . sodium chloride 0.9 % injection 3 mL  3 mL Intravenous Q12H Donne Hazel, MD   3 mL at 02/03/13 0931  . vancomycin (VANCOCIN) 1,500 mg in sodium chloride 0.9 % 500 mL IVPB  1,500 mg Intravenous Q48H Georgina Peer, [facility]        Physical Exam: General appearance: alert and no distress Neck: JVD - 2 cm above sternal notch, no adenopathy, no carotid bruit, supple, symmetrical, trachea midline and thyroid not enlarged, symmetric, no tenderness/mass/nodules Lungs: clear to auscultation bilaterally Heart: regular rate and rhythm, S1, S2 normal, no murmur, click, rub or gallop Abdomen: obese, soft, non-tender Extremities: gangrenous L great toe Pulses: 1+ pulses Skin: Skin color, texture, turgor normal. No rashes or lesions Neurologic: Grossly normal  Lab Results: Results for  orders placed during the hospital encounter of 02/02/13 (from the past 48 hour(s))  GLUCOSE, CAPILLARY     Status: Abnormal   Collection Time    02/03/13 11:31 AM      Result Value Range   Glucose-Capillary 133 (*) 70 - 99 mg/dL   Comment 1 Notify RN    PROTEIN, URINE, 24 HOUR     Status: Abnormal   Collection Time    02/03/13  4:16 PM      Result Value Range   Urine Total Volume-UPROT 1900     Collection Interval-UPROT 24     Protein, Urine 713     Comment: RESULTS CONFIRMED BY MANUAL DILUTION   Protein, 24H Urine 13547 (*) 50 - 100 mg/day  GLUCOSE, CAPILLARY     Status: Abnormal   Collection Time    02/03/13  5:01 PM      Result Value Range   Glucose-Capillary 127 (*) 70 - 99 mg/dL  TROPONIN I     Status: None   Collection Time    02/03/13  5:50 PM      Result Value Range   Troponin I <0.30  <0.30 ng/mL   Comment:            Due to the release kinetics of cTnI,     a negative result within the first hours     of the onset of symptoms does not rule out     myocardial infarction with certainty.     If myocardial infarction is still suspected,     repeat the test at appropriate intervals.  TROPONIN I     Status: None   Collection Time    02/03/13  7:59 PM      Result Value Range   Troponin I <0.30  <0.30 ng/mL   Comment:            Due to the release kinetics of cTnI,     a negative result within the first hours     of the onset of symptoms does not rule out     myocardial infarction with certainty.     If myocardial infarction is still suspected,     repeat the test at appropriate intervals.  GLUCOSE, CAPILLARY     Status: Abnormal   Collection Time    02/03/13  9:16 PM      Result Value Range   Glucose-Capillary 105 (*) 70 - 99 mg/dL   Comment 1 Documented in Chart     Comment 2 Notify RN    RENAL FUNCTION PANEL     Status: Abnormal   Collection Time    02/04/13  5:30 AM      Result Value Range   Sodium 135  135 -  145 mEq/L   Potassium 4.4  3.5 - 5.1 mEq/L    Chloride 108  96 - 112 mEq/L   CO2 16 (*) 19 - 32 mEq/L   Glucose, Bld 103 (*) 70 - 99 mg/dL   BUN 38 (*) 6 - 23 mg/dL   Creatinine, Ser 4.82 (*) 0.50 - 1.10 mg/dL   Calcium 8.4  8.4 - 10.5 mg/dL   Phosphorus 5.4 (*) 2.3 - 4.6 mg/dL   Albumin 1.9 (*) 3.5 - 5.2 g/dL   GFR calc non Af Amer 11 (*) >90 mL/min   GFR calc Af Amer 12 (*) >90 mL/min   Comment:            The eGFR has been calculated     using the CKD EPI equation.     This calculation has not been     validated in all clinical     situations.     eGFR's persistently     <90 mL/min signify     possible Chronic Kidney Disease.  GLUCOSE, CAPILLARY     Status: None   Collection Time    02/04/13  7:24 AM      Result Value Range   Glucose-Capillary 88  70 - 99 mg/dL  GLUCOSE, CAPILLARY     Status: None   Collection Time    02/04/13 12:01 PM      Result Value Range   Glucose-Capillary 70  70 - 99 mg/dL  GLUCOSE, CAPILLARY     Status: None   Collection Time    02/04/13  3:56 PM      Result Value Range   Glucose-Capillary 94  70 - 99 mg/dL  GLUCOSE, CAPILLARY     Status: Abnormal   Collection Time    02/04/13  9:16 PM      Result Value Range   Glucose-Capillary 101 (*) 70 - 99 mg/dL   Comment 1 Documented in Chart     Comment 2 Notify RN    RENAL FUNCTION PANEL     Status: Abnormal   Collection Time    02/05/13  3:27 AM      Result Value Range   Sodium 136  135 - 145 mEq/L   Potassium 4.3  3.5 - 5.1 mEq/L   Chloride 107  96 - 112 mEq/L   CO2 19  19 - 32 mEq/L   Glucose, Bld 88  70 - 99 mg/dL   BUN 40 (*) 6 - 23 mg/dL   Creatinine, Ser 4.85 (*) 0.50 - 1.10 mg/dL   Calcium 8.7  8.4 - 10.5 mg/dL   Phosphorus 5.6 (*) 2.3 - 4.6 mg/dL   Albumin 2.2 (*) 3.5 - 5.2 g/dL   GFR calc non Af Amer 11 (*) >90 mL/min   GFR calc Af Amer 12 (*) >90 mL/min   Comment:            The eGFR has been calculated     using the CKD EPI equation.     This calculation has not been     validated in all clinical     situations.      eGFR's persistently     <90 mL/min signify     possible Chronic Kidney Disease.  GLUCOSE, CAPILLARY     Status: None   Collection Time    02/05/13  6:44 AM      Result Value Range   Glucose-Capillary 77  70 - 99 mg/dL   Comment 1 Notify RN  Comment 2 Documented in Chart      Imaging: Mr Foot Left Wo Contrast  02/03/2013   *RADIOLOGY REPORT*  Clinical Data: Infection of the great toe.  MRI OF THE LEFT FOREFOOT WITHOUT CONTRAST  Technique:  Multiplanar, multisequence MR imaging was performed. No intravenous contrast was administered.  Comparison: Plain films the great toe this same date.  Findings: There is intense marrow edema throughout the great toe. The head of the proximal phalanx is fragmented and there is destructive change of the medial distal phalanx.  A large skin ulceration is seen on the medial side of the distal phalanx.  There is a small IP joint effusion with fluid tracking along the lateral aspect of the distal phalanx.  Bone marrow signal in the metatarsal head is normal and there is no first MTP joint effusion.  Intrinsic musculature of the foot demonstrates age advanced atrophy.  No mass is seen.  IMPRESSION: Findings consistent with osteomyelitis throughout the great toe without extension into the first MTP joint or first metatarsal. Skin ulceration with fluid tracking about the distal phalanx compatible with abscess and septic IP joint of the great toe again noted.   Original Report Authenticated By: Orlean Patten, M.D.   Dg Foot Complete Left  02/03/2013   *RADIOLOGY REPORT*  Clinical Data: Diabetic with nonhealing osteomyelitis of the great toe.  LEFT FOOT - COMPLETE 3+ VIEW  Comparison: 01/25/2013.  Findings: Further osteolysis and destruction of the left first proximal phalanx distal aspect consistent with progressive osteomyelitis. Osteomyelitis also involving the left first distal phalanx with adjacent cellulitis/ soft tissue ulceration.  IMPRESSION: Progressive  osteolysis/osteomyelitis left first toe as noted above.   Original Report Authenticated By: Genia Del, M.D.   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  LEFT GREAT TOE  Comparison: 02/03/2013, 01/25/2013 and 12/24/2011  Findings: There is progressive bone destruction of the distal aspect of the proximal phalangeal bone of the great toe with a pathologic fracture.  The patient has had previous resection of a portion of the distal phalangeal bone.  No other change.  IMPRESSION: Progressive osteomyelitis with destruction of pathologic fracture of the distal aspect of the proximal phalangeal bone. The bone appears rarefied toward the base.  I suspect osteomyelitis involves the entire proximal phalangeal bone.   Original Report Authenticated By: Lorriane Shire, M.D.    Assessment:  Principal Problem:   Osteomyelitis of left great toe  Active Problems:   DM (diabetes mellitus)  Type II   Diabetic neuropathy   Arterial insufficiency   Congestive heart failure   Acute renal failure   Essential hypertension   PAD (peripheral artery disease)   Plan:  1. Continues to diurese with improvement in breathing. Creatinine is stable. Given some of her elevated blood pressures, very likely this could be hypertensive nephropathy +/- cardiomyopathy. We are awaiting an echocardiogram, which was ordered yesterday, to be performed. Consider increase of hydralazine to 50 mg TID for additional blood pressure control.  Plan on OR tomorrow for amputation. ABI's demonstrate 0.71 right with dampened, monophashic waveforms and 0.54 on left, with dampened and monophasic waveforms. TBI's were not performed but are likely quite low.  She has significant PAD, however, angiography would likely push her toward dialysis and should be avoided at this point, until we can better determine if renal function will recover or not.  Despite her echocardiographic  findings, an ischemic evaluation of her heart is  ultimately warranted.  Time Spent Directly with Patient:  15 minutes  Length of Stay:  LOS: 3 days   Pixie Casino, MD, [facility] Attending Cardiologist [facility] 02/05/2013, 9:37 AM

## 2013-02-05 NOTE — Progress Notes (Signed)
[facility] Progress Note        Subjective   Pt in bathroom on my arrival No new issues  Objective  BP 132/86  Pulse 106  Temp(Src) 97.4 F (36.3 C) (Oral)  Resp 20  Ht 5' 6.5" (1.689 m)  Wt 113.218 kg (249 lb 9.6 oz)  BMI 39.69 kg/m2  SpO2 95%  LMP 01/18/2013  Patient Vitals for the past 24 hrs:  BP Temp Temp src Pulse Resp SpO2 Weight  02/05/13 0846 132/86 mmHg 97.4 F (36.3 C) Oral 106 20 95 % -  02/05/13 0554 149/86 mmHg 98.6 F (37 C) Oral 100 20 94 % 113.218 kg (249 lb 9.6 oz)  02/05/13 0429 144/70 mmHg - - 103 - - -  02/04/13 2022 172/105 mmHg 97.5 F (36.4 C) Oral 107 20 94 % -  02/04/13 1459 144/91 mmHg 97.4 F (36.3 C) Oral 102 19 97 % -    Intake/Output     05/17 0701 - 05/18 0700 05/18 0701 - 05/19 0700   P.O. 920 360   I.V. (mL/kg)     IV Piggyback 200    Total Intake(mL/kg) 1120 (9.9) 360 (3.2)   Urine (mL/kg/hr) 2275 (0.8) 300 (0.5)   Total Output 2275 300   Net -1155 +60          Labs Results for Karen, [name] (MRN [id number]) as of 02/05/2013 12:34  Ref. Range 02/05/2013 03:27  Sodium Latest Range: 135-145 mEq/L 136  Potassium Latest Range: 3.5-5.1 mEq/L 4.3  Chloride Latest Range: 96-112 mEq/L 107  CO2 Latest Range: 19-32 mEq/L 19  BUN Latest Range: 6-23 mg/dL 40 (H)  Creatinine Latest Range: 0.50-1.10 mg/dL 4.85 (H)  Calcium Latest Range: 8.4-10.5 mg/dL 8.7  GFR calc non Af Amer Latest Range: >90 mL/min 11 (L)  GFR calc Af Amer Latest Range: >90 mL/min 12 (L)  Glucose Latest Range: 70-99 mg/dL 88  Phosphorus Latest Range: 2.3-4.6 mg/dL 5.6 (H)  Albumin Latest Range: 3.5-5.2 g/dL 2.2 (L)    Assessment and Plan    37 y/o black female admitted for acute/subacute renal injury with chronic osteo L great toe  1. Chronic osteo L great toe        OR tomorrow with Dr. Doran Durand           Continue with IV ABX            2. VT           Per cards            3. Acute/subacute kidney injury           Per Renal  4. CHF        Per medicine  5. HTN            6. Anemia           Likely that of chronic dz           Monitor 7. DM, obesity  8. ID           Continue with current abx            9. Dispo          OR tomorrow for L 1sth proximal phalanx amp    [name], PA-C [facility] [phone] (P) 02/05/2013 12:34 PM

## 2013-02-06 ENCOUNTER — Encounter (HOSPITAL_COMMUNITY)
Admission: AD | Disposition: A | Discharge status: Home / Self Care | Payer: Self-pay | Source: Other Acute Inpatient Hospital | Attending: Internal Medicine

## 2013-02-06 ENCOUNTER — Inpatient Hospital Stay (HOSPITAL_COMMUNITY): Payer: Medicaid Other | Admitting: Anesthesiology

## 2013-02-06 ENCOUNTER — Encounter (HOSPITAL_COMMUNITY): Payer: Self-pay | Admitting: Anesthesiology

## 2013-02-06 ENCOUNTER — Inpatient Hospital Stay (HOSPITAL_COMMUNITY): Payer: Medicaid Other

## 2013-02-06 DIAGNOSIS — I5043 Acute on chronic combined systolic (congestive) and diastolic (congestive) heart failure: Secondary | ICD-10-CM

## 2013-02-06 DIAGNOSIS — I428 Other cardiomyopathies: Secondary | ICD-10-CM

## 2013-02-06 DIAGNOSIS — I255 Ischemic cardiomyopathy: Secondary | ICD-10-CM | POA: Diagnosis present

## 2013-02-06 HISTORY — PX: AMPUTATION: SHX166

## 2013-02-06 LAB — GLUCOSE, CAPILLARY
Glucose-Capillary: 94 mg/dL (ref 70–99)
Glucose-Capillary: 91 mg/dL (ref 70–99)
Glucose-Capillary: 108 mg/dL — ABNORMAL HIGH (ref 70–99)

## 2013-02-06 LAB — RENAL FUNCTION PANEL
Albumin: UNDETERMINED g/dL (ref 3.5–5.2)
Albumin: 2 g/dL — ABNORMAL LOW (ref 3.5–5.2)
BUN: 40 mg/dL — ABNORMAL HIGH (ref 6–23)
CO2: UNDETERMINED mEq/L (ref 19–32)
Calcium: 8.7 mg/dL (ref 8.4–10.5)
Chloride: UNDETERMINED mEq/L (ref 96–112)
Chloride: 106 mEq/L (ref 96–112)
Creatinine, Ser: UNDETERMINED mg/dL (ref 0.50–1.10)
Creatinine, Ser: 4.29 mg/dL — ABNORMAL HIGH (ref 0.50–1.10)
GFR calc Af Amer: UNDETERMINED mL/min (ref >90)
GFR calc non Af Amer: UNDETERMINED mL/min (ref >90)
Potassium: UNDETERMINED mEq/L (ref 3.5–5.1)
Sodium: 135 mEq/L (ref 135–145)

## 2013-02-06 LAB — PARATHYROID HORMONE, INTACT (NO CA): PTH: 116.5 pg/mL — ABNORMAL HIGH (ref 14.0–72.0)

## 2013-02-06 LAB — KAPPA/LAMBDA LIGHT CHAINS
Kappa free light chain: 13.9 mg/dL — ABNORMAL HIGH (ref 0.33–1.94)
Kappa, lambda light chain ratio: 1.73 — ABNORMAL HIGH (ref 0.26–1.65)
Lambda free light chains: 8.03 mg/dL — ABNORMAL HIGH (ref 0.57–2.63)

## 2013-02-06 LAB — HEPATITIS B E ANTIBODY: Hep B E Ab: NEGATIVE

## 2013-02-06 LAB — ANA: Anti Nuclear Antibody(ANA): NEGATIVE

## 2013-02-06 LAB — COMPLEMENT, TOTAL: Compl, Total (CH50): >60 U/mL — ABNORMAL HIGH (ref 31–60)

## 2013-02-06 SURGERY — AMPUTATION DIGIT
Anesthesia: General | Site: Toe | Laterality: Left | Wound class: Dirty or Infected

## 2013-02-06 MED ORDER — ONDANSETRON HCL 4 MG/2ML IJ SOLN: 4.0000 mg | Freq: Once | INTRAMUSCULAR | Status: DC | PRN

## 2013-02-06 MED ORDER — 0.9 % SODIUM CHLORIDE (POUR BTL) OPTIME
TOPICAL | Status: DC | PRN
  Administered 2013-02-06: 1000 mL

## 2013-02-06 MED ORDER — BACITRACIN ZINC 500 UNIT/GM EX OINT
TOPICAL_OINTMENT | CUTANEOUS | Status: DC | PRN
  Administered 2013-02-06: 1 via TOPICAL

## 2013-02-06 MED ORDER — HYDROMORPHONE HCL PF 1 MG/ML IJ SOLN: 0.2500 mg | INTRAMUSCULAR | Status: DC | PRN

## 2013-02-06 MED ORDER — FENTANYL CITRATE 0.05 MG/ML IJ SOLN
INTRAMUSCULAR | Status: DC | PRN
  Administered 2013-02-06 (×2): 25 ug via INTRAVENOUS
  Administered 2013-02-06 (×2): 50 ug via INTRAVENOUS

## 2013-02-06 MED ORDER — OXYCODONE HCL 5 MG PO TABS
5.0000 mg | ORAL_TABLET | ORAL | Status: DC | PRN
  Administered 2013-02-06 – 2013-02-10 (×3): 10 mg via ORAL
  Filled 2013-02-06 (×3): qty 2

## 2013-02-06 MED ORDER — PROPOFOL 10 MG/ML IV BOLUS
INTRAVENOUS | Status: DC | PRN
  Administered 2013-02-06: 130 mg via INTRAVENOUS

## 2013-02-06 MED ORDER — INSULIN GLARGINE 100 UNIT/ML ~~LOC~~ SOLN
15.0000 [IU] | Freq: Every day | SUBCUTANEOUS | Status: DC
Start: 2013-02-06 — End: 2013-02-10
  Administered 2013-02-08 – 2013-02-09 (×2): 15 [IU] via SUBCUTANEOUS
  Filled 2013-02-06 (×5): qty 0.15

## 2013-02-06 MED ORDER — MEPERIDINE HCL 25 MG/ML IJ SOLN: 6.2500 mg | INTRAMUSCULAR | Status: DC | PRN

## 2013-02-06 MED ORDER — BACITRACIN ZINC 500 UNIT/GM EX OINT
TOPICAL_OINTMENT | CUTANEOUS | Status: AC
Start: 2013-02-06 — End: 2013-02-06
  Filled 2013-02-06: qty 15

## 2013-02-06 MED ORDER — LACTATED RINGERS IV SOLN: INTRAVENOUS | Status: DC | PRN

## 2013-02-06 MED ORDER — MIDAZOLAM HCL 5 MG/5ML IJ SOLN
INTRAMUSCULAR | Status: DC | PRN
  Administered 2013-02-06: 2 mg via INTRAVENOUS

## 2013-02-06 MED ORDER — OXYCODONE HCL 5 MG/5ML PO SOLN: 5.0000 mg | Freq: Once | ORAL | Status: DC | PRN

## 2013-02-06 MED ORDER — SODIUM CHLORIDE 0.9 % IV SOLN
INTRAVENOUS | Status: DC
  Administered 2013-02-06: 13:00:00 via INTRAVENOUS
  Administered 2013-02-06: 20 mL/h via INTRAVENOUS

## 2013-02-06 MED ORDER — OXYCODONE HCL 5 MG PO TABS: 5.0000 mg | ORAL_TABLET | Freq: Once | ORAL | Status: DC | PRN

## 2013-02-06 MED ORDER — LIDOCAINE HCL (CARDIAC) 20 MG/ML IV SOLN
INTRAVENOUS | Status: DC | PRN
  Administered 2013-02-06: 100 mg via INTRAVENOUS

## 2013-02-06 MED ORDER — ONDANSETRON HCL 4 MG/2ML IJ SOLN
INTRAMUSCULAR | Status: DC | PRN
  Administered 2013-02-06: 4 mg via INTRAVENOUS

## 2013-02-06 MED ORDER — METOPROLOL TARTRATE 25 MG PO TABS
25.0000 mg | ORAL_TABLET | Freq: Two times a day (BID) | ORAL | Status: DC
  Administered 2013-02-06 – 2013-02-07 (×2): 25 mg via ORAL
  Filled 2013-02-06 (×4): qty 1

## 2013-02-06 MED ORDER — HYDRALAZINE HCL 20 MG/ML IJ SOLN
INTRAMUSCULAR | Status: AC
  Filled 2013-02-06: qty 1

## 2013-02-06 MED ORDER — SODIUM CHLORIDE 0.9 % IV SOLN
1020.0000 mg | Freq: Once | INTRAVENOUS | Status: AC
  Administered 2013-02-06: 1020 mg via INTRAVENOUS
  Filled 2013-02-06: qty 34

## 2013-02-06 MED ORDER — HYDRALAZINE HCL 50 MG PO TABS
50.0000 mg | ORAL_TABLET | Freq: Three times a day (TID) | ORAL | Status: DC
  Administered 2013-02-06 – 2013-02-10 (×10): 50 mg via ORAL
  Filled 2013-02-06 (×15): qty 1

## 2013-02-06 SURGICAL SUPPLY — 29 items
BANDAGE ELASTIC 3 VELCRO ST LF (GAUZE/BANDAGES/DRESSINGS) ×2 IMPLANT
BANDAGE ELASTIC 4 VELCRO ST LF (GAUZE/BANDAGES/DRESSINGS) ×2 IMPLANT
BNDG ESMARK 4X9 LF (GAUZE/BANDAGES/DRESSINGS) ×2 IMPLANT
CHLORAPREP W/TINT 26ML (MISCELLANEOUS) ×4 IMPLANT
CLOTH BEACON ORANGE TIMEOUT ST (SAFETY) ×2 IMPLANT
DRAPE U-SHAPE 47X51 STRL (DRAPES) ×2 IMPLANT
DRSG EMULSION OIL 3X3 NADH (GAUZE/BANDAGES/DRESSINGS) ×2 IMPLANT
ELECT REM PT RETURN 9FT ADLT (ELECTROSURGICAL) ×2
ELECTRODE REM PT RTRN 9FT ADLT (ELECTROSURGICAL) ×1 IMPLANT
GLOVE BIO SURGEON STRL SZ 6.5 (GLOVE) ×2 IMPLANT
GLOVE BIO SURGEON STRL SZ8 (GLOVE) ×4 IMPLANT
GLOVE BIOGEL PI IND STRL 8 (GLOVE) ×1 IMPLANT
GLOVE BIOGEL PI INDICATOR 8 (GLOVE) ×1
GOWN PREVENTION PLUS XLARGE (GOWN DISPOSABLE) ×2 IMPLANT
GOWN STRL NON-REIN LRG LVL3 (GOWN DISPOSABLE) ×2 IMPLANT
KIT BASIN OR (CUSTOM PROCEDURE TRAY) ×2 IMPLANT
KIT ROOM TURNOVER OR (KITS) ×2 IMPLANT
NS IRRIG 1000ML POUR BTL (IV SOLUTION) ×2 IMPLANT
PACK ORTHO EXTREMITY (CUSTOM PROCEDURE TRAY) ×2 IMPLANT
PAD ARMBOARD 7.5X6 YLW CONV (MISCELLANEOUS) ×4 IMPLANT
PAD CAST 4YDX4 CTTN HI CHSV (CAST SUPPLIES) ×1 IMPLANT
PADDING CAST COTTON 4X4 STRL (CAST SUPPLIES) ×1
SPONGE GAUZE 4X4 12PLY (GAUZE/BANDAGES/DRESSINGS) ×4 IMPLANT
SUCTION FRAZIER TIP 10 FR DISP (SUCTIONS) ×2 IMPLANT
SUT ETHILON 3 0 PS 1 (SUTURE) ×4 IMPLANT
TOWEL OR 17X24 6PK STRL BLUE (TOWEL DISPOSABLE) ×2 IMPLANT
TOWEL OR 17X26 10 PK STRL BLUE (TOWEL DISPOSABLE) ×2 IMPLANT
UNDERPAD 30X30 INCONTINENT (UNDERPADS AND DIAPERS) ×2 IMPLANT
WATER STERILE IRR 1000ML POUR (IV SOLUTION) ×2 IMPLANT

## 2013-02-06 NOTE — Brief Op Note (Signed)
02/02/2013 - 02/06/2013  1:22 PM  PATIENT:  Karen [name]  37 y.o. female  PRE-OPERATIVE DIAGNOSIS:  Chronic osteomyelitis LEFT great toe  POST-OPERATIVE DIAGNOSIS:  Chronic osteomyelitis Left Great toe  Procedure(s): AMPUTATION LEFT GREAT TOE through the MTP joint  SURGEON:  Wylene Simmer, MD  ASSISTANT: n/a  ANESTHESIA:   General  EBL:  minimal   TOURNIQUET:  10 min with ankle esmarch  COMPLICATIONS:  None apparent  DISPOSITION:  Extubated, awake and stable to recovery.  DICTATION ID:  [id number]

## 2013-02-06 NOTE — OR Nursing (Signed)
[place] bandage on from 1306 to 1316.

## 2013-02-06 NOTE — Progress Notes (Signed)
[facility] PROGRESS NOTE  Karen [name] [id number] DOB: 1976/02/28 DOA: 02/02/2013 PCP: [name], MD  Assessment/Plan: Principal Problem:   Osteomyelitis of left great toe  Active Problems:   DM (diabetes mellitus)  Type II   Diabetic neuropathy   Arterial insufficiency   Congestive heart failure   Acute renal failure   Essential hypertension   PAD (peripheral artery disease)    Sustained ventricular tachycardia/artifact  no true arrhythmia involved  flutter-like artifact  Patient is asymptomatic, electrolytes normal  Given one dose of IV metoprolol,  Discontinue levofloxacin  Check cardiac enzymes  2-D echo EF of 45-50% Cardiology consultation from [facility]  She remains tachycardic with HR in the low 100s    Osteomyelitis  Discontinued levofloxacin  Switched to vancomycin/imipenem  Consulted orthopedics, Dr. Marcelino Scot  Right: ABI indicates a moderate reduction in arterial flow  MRI shows osteomyelitis throughout the great toe  without extension into the first MTP joint or first metatarsal.  Skin ulceration with fluid tracking about the distal phalanx  compatible with abscess and septic IP joint of the great toe again  noted.  OR today with Dr. Doran Durand   Diabetes  Continue sliding scale insulin   Acute on chronic renal failure  Renal ultrasound No hydronephrosis  Renal fn slightly worse today  Complement levels normal  Appreciate nephrology input  comp nml, HIV and hep neg, anti MP3 and protease neg   CHF  Fluid restriction and Lasix  2-D echo pending  very likely this could be hypertensive nephropathy +/- cardiomyopathy EF: 45% - 50% increase of hydralazine to 50 mg TID    Hypertension  Continue clonidine, hold off on ACE/ARB secondary to renal failure  on hydralazine 50 milligrams 3 times a day   Code Status: full  Family Communication: family updated about patient's clinical progress  Disposition Plan: As above     Brief narrative:  Karen [name] is a 37 y.o. female  Who was transferred from [facility] with concerns for acute renal failure necessitating a nephrology workup. Briefly, the patient has a history of poorly controlled as well as a history of "on and off" osteomyelitis of the left toe since 2009. In fact she was recently discharged from [facility] for treatment of osteomyelitis of the toe. There none hospital course she was treated with vancomycin and was discharged with Levaquin. During the hospital stay, the patient was noted to have evidence of acute renal failure with a creatinine of over 3, baseline creatinine of 1-2 per documentation. The patient was subsequently discharged from the hospital admission on 01/30/2013. Shortly afterwards, the patient noted gradually "swelling all over". At this time,the creatinine was found to be 4.1 with a calculated GFR of around 15. The patient showed signs of volume overload and was subsequently given a dose of IV Lasix. She was then transferred to [facility] for further management  Consultants:  Procedures:  Antibiotics:  Levaquin discontinue  Vancomycin/imipenem starting 5/16 HPI/Subjective:  Denies any chest pain any shortness of breath,  Objective: Filed Vitals:   02/05/13 0846 02/05/13 1430 02/05/13 2138 02/06/13 0532  BP: 132/86 139/91 158/101 152/89  Pulse: 106 103 107 103  Temp: 97.4 F (36.3 C) 97.9 F (36.6 C) 98.1 F (36.7 C) 98.2 F (36.8 C)  TempSrc: Oral Oral Oral Oral  Resp: 20 22 21 22   Height:      Weight:    113.7 kg (250 lb 10.6 oz)  SpO2: 95% 95% 95% 97%  Intake/Output Summary (Last 24 hours) at 02/06/13 0745 Last data filed at 02/06/13 0310  Gross per 24 hour  Intake   1180 ml  Output   2200 ml  Net  -1020 ml    Exam:  General appearance: alert and no distress  Neck: JVD - 2 cm above sternal notch, no adenopathy, no carotid bruit, supple, symmetrical, trachea midline and thyroid not  enlarged, symmetric, no tenderness/mass/nodules  Lungs: clear to auscultation bilaterally  Heart: regular rate and rhythm, S1, S2 normal, no murmur, click, rub or gallop  Abdomen: obese, soft, non-tender  Extremities: gangrenous L great toe  Pulses: 1+ pulses  Skin: Skin color, texture, turgor normal. No rashes or lesions  Neurologic: Grossly normal     Data Reviewed: Basic Metabolic Panel:  Recent Labs Lab 02/02/13 2254 02/03/13 0500 02/04/13 0530 02/05/13 0327 02/06/13 0400 02/06/13 0615  NA  --  136 135 136 135 135  K  --  4.5 4.4 4.3 SPECIMEN CONTAMINATED, UNABLE TO PERFORM TEST(S). 4.4  CL  --  107 108 107 SPECIMEN CONTAMINATED, UNABLE TO PERFORM TEST(S). 106  CO2  --  15* 16* 19 SPECIMEN CONTAMINATED, UNABLE TO PERFORM TEST(S). 16*  GLUCOSE  --  120* 103* 88 SPECIMEN CONTAMINATED, UNABLE TO PERFORM TEST(S). 132*  BUN  --  36* 38* 40* SPECIMEN CONTAMINATED, UNABLE TO PERFORM TEST(S). 40*  CREATININE 4.65* 4.58* 4.82* 4.85* SPECIMEN CONTAMINATED, UNABLE TO PERFORM TEST(S). 4.29*  CALCIUM  --  8.2* 8.4 8.7 SPECIMEN CONTAMINATED, UNABLE TO PERFORM TEST(S). 8.7  MG  --  2.6*  --   --   --   --   PHOS  --  4.9* 5.4* 5.6* SPECIMEN CONTAMINATED, UNABLE TO PERFORM TEST(S). 5.0*    Liver Function Tests:  Recent Labs Lab 02/03/13 0500 02/04/13 0530 02/05/13 0327 02/06/13 0400 02/06/13 0615  ALBUMIN 2.0* 1.9* 2.2* SPECIMEN CONTAMINATED, UNABLE TO PERFORM TEST(S). 2.0*   No results found for this basename: LIPASE, AMYLASE,  in the last 168 hours No results found for this basename: AMMONIA,  in the last 168 hours  CBC:  Recent Labs Lab 02/02/13 2254 02/03/13 0500  WBC 13.4* 11.5*  NEUTROABS  --  7.5  HGB 7.8* 7.5*  HCT 24.1* 23.1*  MCV 80.6 80.5  PLT 345 329    Cardiac Enzymes:  Recent Labs Lab 02/03/13 0835 02/03/13 1750 02/03/13 1959  TROPONINI <0.30 <0.30 <0.30   BNP (last 3 results) No results found for this basename: PROBNP,  in the last 8760  hours   CBG:  Recent Labs Lab 02/05/13 0644 02/05/13 1044 02/05/13 1555 02/05/13 2119 02/06/13 0603  GLUCAP 77 109* 105* 120* 126*    Recent Results (from the past 240 hour(s))  MRSA PCR SCREENING     Status: None   Collection Time    02/02/13  8:24 PM      Result Value Range Status   MRSA by PCR NEGATIVE  NEGATIVE Final   Comment:            The GeneXpert MRSA Assay (FDA     approved for NASAL specimens     only), is one component of a     comprehensive MRSA colonization     surveillance program. It is not     intended to diagnose MRSA     infection nor to guide or     monitor treatment for     MRSA infections.  SURGICAL PCR SCREEN     Status: None  Collection Time    02/05/13 10:21 PM      Result Value Range Status   MRSA, PCR NEGATIVE  NEGATIVE Final   Staphylococcus aureus NEGATIVE  NEGATIVE Final   Comment:            The Xpert SA Assay (FDA     approved for NASAL specimens     in patients over 65 years of age),     is one component of     a comprehensive surveillance     program.  Test performance has     been validated by [employer] for patients greater     than or equal to 82 year old.     It is not intended     to diagnose infection nor to     guide or monitor treatment.     Studies: US Renal  02/02/2013   *RADIOLOGY REPORT*  Clinical Data: Renal failure  RENAL/URINARY TRACT ULTRASOUND COMPLETE  Comparison:  None.  Findings:  Right Kidney:  12.8 cm. No hydronephrosis or renal mass.  Left Kidney:  14.0 cm. No hydronephrosis or renal mass.  Bladder:  Not fully distended.  No gross abnormality.  IMPRESSION: No hydronephrosis.   Original Report Authenticated By: Genia Del, M.D.   Mr Foot Left Wo Contrast  02/03/2013   *RADIOLOGY REPORT*  Clinical Data: Infection of the great toe.  MRI OF THE LEFT FOREFOOT WITHOUT CONTRAST  Technique:  Multiplanar, multisequence MR imaging was performed. No intravenous contrast was administered.  Comparison: Plain  films the great toe this same date.  Findings: There is intense marrow edema throughout the great toe. The head of the proximal phalanx is fragmented and there is destructive change of the medial distal phalanx.  A large skin ulceration is seen on the medial side of the distal phalanx.  There is a small IP joint effusion with fluid tracking along the lateral aspect of the distal phalanx.  Bone marrow signal in the metatarsal head is normal and there is no first MTP joint effusion.  Intrinsic musculature of the foot demonstrates age advanced atrophy.  No mass is seen.  IMPRESSION: Findings consistent with osteomyelitis throughout the great toe without extension into the first MTP joint or first metatarsal. Skin ulceration with fluid tracking about the distal phalanx compatible with abscess and septic IP joint of the great toe again noted.   Original Report Authenticated By: Orlean Patten, M.D.   Dg Foot Complete Left  02/03/2013   *RADIOLOGY REPORT*  Clinical Data: Diabetic with nonhealing osteomyelitis of the great toe.  LEFT FOOT - COMPLETE 3+ VIEW  Comparison: 01/25/2013.  Findings: Further osteolysis and destruction of the left first proximal phalanx distal aspect consistent with progressive osteomyelitis. Osteomyelitis also involving the left first distal phalanx with adjacent cellulitis/ soft tissue ulceration.  IMPRESSION: Progressive osteolysis/osteomyelitis left first toe as noted above.   Original Report Authenticated By: Genia Del, M.D.   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  LEFT GREAT TOE  Comparison: 02/03/2013, 01/25/2013 and 12/24/2011  Findings: There is progressive bone destruction of the distal aspect of the proximal phalangeal bone of the great toe with a pathologic fracture.  The patient has had previous resection of a portion of the distal phalangeal bone.  No other change.  IMPRESSION: Progressive  osteomyelitis with destruction of pathologic fracture of the distal aspect of the proximal phalangeal bone.  The bone appears rarefied toward the base.  I suspect osteomyelitis involves the entire proximal phalangeal bone.   Original Report Authenticated By: Lorriane Shire, M.D.    Scheduled Meds: . calcium carbonate  1 tablet Oral Q breakfast  . darbepoetin (ARANESP) injection - NON-DIALYSIS  100 mcg Subcutaneous Q Fri-1800  . furosemide  40 mg Intravenous BID  . heparin  5,000 Units Subcutaneous Q8H  . hydrALAZINE  50 mg Oral BID  . imipenem-cilastatin  250 mg Intravenous Q6H  . insulin aspart  0-15 Units Subcutaneous TID WC  . insulin aspart  0-5 Units Subcutaneous QHS  . insulin glargine  20 Units Subcutaneous QHS  . LORazepam  0.5 mg Oral Once  . nystatin cream   Topical BID  . sodium chloride  10-40 mL Intracatheter Q12H  . sodium chloride  3 mL Intravenous Q12H  . vancomycin  1,500 mg Intravenous Q48H   Continuous Infusions:   Principal Problem:   Osteomyelitis of left great toe  Active Problems:   DM (diabetes mellitus)  Type II   Diabetic neuropathy   Arterial insufficiency   Congestive heart failure   Acute renal failure   Essential hypertension   PAD (peripheral artery disease)    Time spent: 40 minutes   [facility] Pager [phone]. If 8PM-8AM, please contact night-coverage at [email or web address], password [facility] 02/06/2013, 7:45 AM  LOS: 4 days

## 2013-02-06 NOTE — Progress Notes (Signed)
Patient ID: Karen [name], female   DOB: July 26, 1976, 37 y.o.   MRN: [id number]   [facility] Progress Note    Subjective:   Reports to be sleepy and hungry this morning. Orthopedic surgery have planned toe amputation today at around noon. The patient is unable to tell me of any history of retinopathy-last saw an ophthalmologist "a long time ago".    Objective:   BP 152/89  Pulse 103  Temp(Src) 98.2 F (36.8 C) (Oral)  Resp 22  Ht 5' 6.5" (1.689 m)  Wt 113.7 kg (250 lb 10.6 oz)  BMI 39.86 kg/m2  SpO2 97%  LMP 01/18/2013  Intake/Output Summary (Last 24 hours) at 02/06/13 0849 Last data filed at 02/06/13 0310  Gross per 24 hour  Intake   1180 ml  Output   2200 ml  Net  -1020 ml   Weight change: 0.482 kg (1 lb 1 oz)  Physical Exam: Gen: Comfortably sleeping in bed, awakens to voice CVS: Pulse regular tachycardia, heart sounds S1 and S2 normal Resp: Clear to auscultation bilaterally Abd: Soft, obese, nontender and bowel sounds are normal Ext: One plus lower extremity edema-2+ in dependent areas  Imaging: No results found.  Labs: BMET  Recent Labs Lab 02/02/13 2254 02/03/13 0500 02/04/13 0530 02/05/13 0327 02/06/13 0400 02/06/13 0615  NA  --  136 135 136 135 135  K  --  4.5 4.4 4.3 SPECIMEN CONTAMINATED, UNABLE TO PERFORM TEST(S). 4.4  CL  --  107 108 107 SPECIMEN CONTAMINATED, UNABLE TO PERFORM TEST(S). 106  CO2  --  15* 16* 19 SPECIMEN CONTAMINATED, UNABLE TO PERFORM TEST(S). 16*  GLUCOSE  --  120* 103* 88 SPECIMEN CONTAMINATED, UNABLE TO PERFORM TEST(S). 132*  BUN  --  36* 38* 40* SPECIMEN CONTAMINATED, UNABLE TO PERFORM TEST(S). 40*  CREATININE 4.65* 4.58* 4.82* 4.85* SPECIMEN CONTAMINATED, UNABLE TO PERFORM TEST(S). 4.29*  CALCIUM  --  8.2* 8.4 8.7 SPECIMEN CONTAMINATED, UNABLE TO PERFORM TEST(S). 8.7  PHOS  --  4.9* 5.4* 5.6* SPECIMEN CONTAMINATED, UNABLE TO PERFORM TEST(S). 5.0*   CBC  Recent Labs Lab 02/02/13 2254 02/03/13 0500  WBC  13.4* 11.5*  NEUTROABS  --  7.5  HGB 7.8* 7.5*  HCT 24.1* 23.1*  MCV 80.6 80.5  PLT 345 329    Medications:    . calcium carbonate  1 tablet Oral Q breakfast  . darbepoetin (ARANESP) injection - NON-DIALYSIS  100 mcg Subcutaneous Q Fri-1800  . furosemide  40 mg Intravenous BID  . heparin  5,000 Units Subcutaneous Q8H  . hydrALAZINE  50 mg Oral Q8H  . imipenem-cilastatin  250 mg Intravenous Q6H  . insulin aspart  0-15 Units Subcutaneous TID WC  . insulin aspart  0-5 Units Subcutaneous QHS  . insulin glargine  15 Units Subcutaneous QHS  . LORazepam  0.5 mg Oral Once  . nystatin cream   Topical BID  . sodium chloride  10-40 mL Intracatheter Q12H  . sodium chloride  3 mL Intravenous Q12H  . vancomycin  1,500 mg Intravenous Q48H     Assessment/ Plan:   1. Acute renal failure on chronic kidney disease versus progressive chronic kidney disease- 2 years ago normal renal function-kidneys large with no hydro-broad differential diagnosis list given longstanding osteo and multiple antibiotic through the years- She has nephrotic range proteinuria but minimal hematuria and it could be consistent only with poorly controlled DM and diabetic nephropathy vs something more ominous. Good UOP on lasix and slightly improved creatinine. - no  indications for HD, not uremic. Given suspicion of possible GN-plan for renal biopsy later in the week.  2. Anemia- on ESA, iron stores borderline low-will replete intravenously. 3. HTN/vol- high - on hydralazine 50 BID - Now on lasix with diuresis and is improving. Would continue moderate diuresis  4. Chronic left hallux osteomyelitis- primaxin and vanc appreciate ortho recs, plans for amputation today   Elmarie Shiley, MD 02/06/2013, 8:49 AM

## 2013-02-06 NOTE — Transfer of Care (Signed)
Immediate Anesthesia Transfer of Care Note  Patient: Karen [name]  Procedure(s) Performed: Procedure(s): AMPUTATION LEFT GREAT TOE (Left)  Patient Location: PACU  Anesthesia Type:General  Level of Consciousness: awake, alert  and oriented  Airway & Oxygen Therapy: Patient Spontanous Breathing and Patient connected to nasal cannula oxygen  Post-op Assessment: Report given to PACU RN and Post -op Vital signs reviewed and stable  Post vital signs: Reviewed and stable  Complications: No apparent anesthesia complications

## 2013-02-06 NOTE — Progress Notes (Signed)
Subjective:  SOB is imrpoved.  Objective:  Vital Signs in the last 24 hours: Temp:  [97.9 F (36.6 C)-98.2 F (36.8 C)] 98.2 F (36.8 C) (05/19 0532) Pulse Rate:  [103-107] 103 (05/19 0532) Resp:  [21-22] 22 (05/19 0532) BP: (139-158)/(89-101) 152/89 mmHg (05/19 0532) SpO2:  [95 %-97 %] 97 % (05/19 0532) Weight:  [113.7 kg (250 lb 10.6 oz)] 113.7 kg (250 lb 10.6 oz) (05/19 0532)  Intake/Output from previous day:  Intake/Output Summary (Last 24 hours) at 02/06/13 0950 Last data filed at 02/06/13 0905  Gross per 24 hour  Intake   1180 ml  Output   2701 ml  Net  -1521 ml   . calcium carbonate  1 tablet Oral Q breakfast  . darbepoetin (ARANESP) injection - NON-DIALYSIS  100 mcg Subcutaneous Q Fri-1800  . furosemide  40 mg Intravenous BID  . heparin  5,000 Units Subcutaneous Q8H  . hydrALAZINE      . hydrALAZINE  50 mg Oral Q8H  . imipenem-cilastatin  250 mg Intravenous Q6H  . insulin aspart  0-15 Units Subcutaneous TID WC  . insulin aspart  0-5 Units Subcutaneous QHS  . insulin glargine  15 Units Subcutaneous QHS  . LORazepam  0.5 mg Oral Once  . nystatin cream   Topical BID  . vancomycin  1,500 mg Intravenous Q48H    Physical Exam: General appearance: alert, cooperative, no distress and morbidly obese Lungs: clear to auscultation bilaterally Heart: regular rate and rhythm Extrem: Trace edema   Rate: 105  Rhythm: normal sinus rhythm and sinus tachycardia  Lab Results: No results found for this basename: WBC, HGB, PLT,  in the last 72 hours  Recent Labs  02/06/13 0400 02/06/13 0615  NA 135 135  K SPECIMEN CONTAMINATED, UNABLE TO PERFORM TEST(S). 4.4  CL SPECIMEN CONTAMINATED, UNABLE TO PERFORM TEST(S). 106  CO2 SPECIMEN CONTAMINATED, UNABLE TO PERFORM TEST(S). 16*  GLUCOSE SPECIMEN CONTAMINATED, UNABLE TO PERFORM TEST(S). 132*  BUN SPECIMEN CONTAMINATED, UNABLE TO PERFORM TEST(S). 40*  CREATININE SPECIMEN CONTAMINATED, UNABLE TO PERFORM TEST(S). 4.29*    Results for orders placed during the hospital encounter of 02/02/13 (from the past 24 hour(s))  GLUCOSE, CAPILLARY     Status: Abnormal   Collection Time    02/05/13  9:19 PM      Result Value Range   Glucose-Capillary 120 (*) 70 - 99 mg/dL  SURGICAL PCR SCREEN     Status: None   Collection Time    02/05/13 10:21 PM      Result Value Range   MRSA, PCR NEGATIVE  NEGATIVE   Staphylococcus aureus NEGATIVE  NEGATIVE  RENAL FUNCTION PANEL     Status: None   Collection Time    02/06/13  4:00 AM      Result Value Range   Sodium 135  135 - 145 mEq/L   Potassium SPECIMEN CONTAMINATED, UNABLE TO PERFORM TEST(S).  3.5 - 5.1 mEq/L   Chloride SPECIMEN CONTAMINATED, UNABLE TO PERFORM TEST(S).  96 - 112 mEq/L   CO2 SPECIMEN CONTAMINATED, UNABLE TO PERFORM TEST(S).  19 - 32 mEq/L   Glucose, Bld SPECIMEN CONTAMINATED, UNABLE TO PERFORM TEST(S).  70 - 99 mg/dL   BUN SPECIMEN CONTAMINATED, UNABLE TO PERFORM TEST(S).  6 - 23 mg/dL   Creatinine, Ser SPECIMEN CONTAMINATED, UNABLE TO PERFORM TEST(S).  0.50 - 1.10 mg/dL   Calcium SPECIMEN CONTAMINATED, UNABLE TO PERFORM TEST(S).  8.4 - 10.5 mg/dL   Phosphorus SPECIMEN CONTAMINATED, UNABLE TO PERFORM TEST(S).  2.3 -  4.6 mg/dL   Albumin SPECIMEN CONTAMINATED, UNABLE TO PERFORM TEST(S).  3.5 - 5.2 g/dL   GFR calc non Af Amer SPECIMEN CONTAMINATED, UNABLE TO PERFORM TEST(S).  >90 mL/min   GFR calc Af Amer SPECIMEN CONTAMINATED, UNABLE TO PERFORM TEST(S).  >90 mL/min  GLUCOSE, CAPILLARY     Status: Abnormal   Collection Time    02/06/13  6:03 AM      Result Value Range   Glucose-Capillary 126 (*) 70 - 99 mg/dL   Comment 1 Notify RN    RENAL FUNCTION PANEL     Status: Abnormal   Collection Time    02/06/13  6:15 AM      Result Value Range   Sodium 135  135 - 145 mEq/L   Potassium 4.4  3.5 - 5.1 mEq/L   Chloride 106  96 - 112 mEq/L   CO2 16 (*) 19 - 32 mEq/L   Glucose, Bld 132 (*) 70 - 99 mg/dL   BUN 40 (*) 6 - 23 mg/dL   Creatinine, Ser 4.29 (*)  0.50 - 1.10 mg/dL   Calcium 8.7  8.4 - 10.5 mg/dL   Phosphorus 5.0 (*) 2.3 - 4.6 mg/dL   Albumin 2.0 (*) 3.5 - 5.2 g/dL   GFR calc non Af Amer 12 (*) >90 mL/min   GFR calc Af Amer 14 (*) >90 mL/min  GLUCOSE, CAPILLARY     Status: Abnormal   Collection Time    02/06/13 11:12 AM      Result Value Range   Glucose-Capillary 101 (*) 70 - 99 mg/dL   Comment 1 Documented in Chart    GLUCOSE, CAPILLARY     Status: None   Collection Time    02/06/13  1:38 PM      Result Value Range   Glucose-Capillary 94  70 - 99 mg/dL   Comment 1 Notify RN    GLUCOSE, CAPILLARY     Status: None   Collection Time    02/06/13  3:40 PM      Result Value Range   Glucose-Capillary 91  70 - 99 mg/dL    Recent Labs  02/03/13 1750 02/03/13 1959  TROPONINI <0.30 <0.30   Hepatic Function Panel  Recent Labs  02/06/13 0615  ALBUMIN 2.0*   No results found for this basename: CHOL,  in the last 72 hours No results found for this basename: INR,  in the last 72 hours  Imaging: No results found.  Cardiac Studies:  Assessment/Plan:   Principal Problem:   Acute renal failure Active Problems:   Osteomyelitis of left great toe    Acute combined systolic and diastolic congestive heart failure   DM (diabetes mellitus)  Type II   Essential hypertension   Cardiomyopathy, ischemic - EF 45-50% with inf WMA by 2D 02/05/13   Diabetic neuropathy   PAD (peripheral artery disease)    PLAN: Check CXR. It appears by echo that she may have underlying CAD, will discuss with MD.  Kerin Ransom PA-C [facility] [phone] 02/06/2013, 9:50 AM  Echo Study Conclusions  - Left ventricle: The cavity size was normal. Wall thickness was increased in a pattern of mild LVH. Systolic function was mildly reduced. The estimated ejection fraction was in the range of 45% to 50%. Severe hypokinesis of the inferior and inferoseptal myocardium. Heart rate related E-A and e'-a' fusion precludes assessment of diastolic function. -  Mitral valve: Mild to moderate regurgitation directed centrally. - Left atrium: The atrium was mildly dilated. -  Pulmonary arteries: Systolic pressure was mildly increased. PA peak pressure: 2mm Hg (S).  CXR IMPRESSION:  1. Slightly improved aeration although there is poor aeration  overall. Persistent cardiomegaly and moderate pulmonary vascular  congestion.  2. Right upper extremity PICC line tip near expected SVC - RA  junction.   Patient seen and examined. Agree with assessment and plan. Back from OR; s/p amputation of left great toe through MTP joint. Echo suggests an ischemic etiology with focal inferior wall motion abnormalities. Will add beta-blocker with sinus tachycardia.   Troy Sine, MD, [facility] 02/06/2013 6:28 PM

## 2013-02-06 NOTE — Preoperative (Signed)
Beta Blockers   Reason not to administer Beta Blockers:Not Applicable 

## 2013-02-06 NOTE — Consult Note (Signed)
Reason for Consult:  Left hallux osteomyelitis Referring Physician: Dr. Zenovia Jarred is an 37 y.o. female.  HPI: 37 y/o female admitted for left forefoot infection.  She says she's had problems with the toe since 09.  She c/o a chronic ulcer at the hallux.  Denies any injury to the toe.  Denies any pain.  Past Medical History  Diagnosis Date  . Hypertension   . Anemia   . History of blood transfusion     "last week" (02/02/2013)  . GERD (gastroesophageal reflux disease)   . Osteomyelitis of toe of left foot     "off and on since 2009; no OR" (02/02/2013)  . Renal failure     acute vs chronic/notes 02/02/2013  . Type II diabetes mellitus 1995  . Exertional shortness of breath     "recently; it's fluid" (02/02/2013)    Past Surgical History  Procedure Laterality Date  . Cesarean section  10/18/2006    History reviewed. No pertinent family history.  Social History:  reports that she quit smoking about 8 years ago. Her smoking use included Cigarettes. She has a 30 pack-year smoking history. She has never used smokeless tobacco. She reports that she does not drink alcohol or use illicit drugs.  Allergies:  Allergies  Allergen Reactions  . Morphine And Related Rash  . Penicillins Rash    Medications: I have reviewed the patient's current medications.  Results for orders placed during the hospital encounter of 02/02/13 (from the past 48 hour(s))  GLUCOSE, CAPILLARY     Status: None   Collection Time    02/04/13 12:01 PM      Result Value Range   Glucose-Capillary 70  70 - 99 mg/dL  GLUCOSE, CAPILLARY     Status: None   Collection Time    02/04/13  3:56 PM      Result Value Range   Glucose-Capillary 94  70 - 99 mg/dL  GLUCOSE, CAPILLARY     Status: Abnormal   Collection Time    02/04/13  9:16 PM      Result Value Range   Glucose-Capillary 101 (*) 70 - 99 mg/dL   Comment 1 Documented in Chart     Comment 2 Notify RN    RENAL FUNCTION PANEL     Status: Abnormal   Collection Time    02/05/13  3:27 AM      Result Value Range   Sodium 136  135 - 145 mEq/L   Potassium 4.3  3.5 - 5.1 mEq/L   Chloride 107  96 - 112 mEq/L   CO2 19  19 - 32 mEq/L   Glucose, Bld 88  70 - 99 mg/dL   BUN 40 (*) 6 - 23 mg/dL   Creatinine, Ser 4.85 (*) 0.50 - 1.10 mg/dL   Calcium 8.7  8.4 - 10.5 mg/dL   Phosphorus 5.6 (*) 2.3 - 4.6 mg/dL   Albumin 2.2 (*) 3.5 - 5.2 g/dL   GFR calc non Af Amer 11 (*) >90 mL/min   GFR calc Af Amer 12 (*) >90 mL/min   Comment:            The eGFR has been calculated     using the CKD EPI equation.     This calculation has not been     validated in all clinical     situations.     eGFR's persistently     <90 mL/min signify     possible Chronic Kidney Disease.  IRON AND TIBC     Status: Abnormal   Collection Time    02/05/13  3:27 AM      Result Value Range   Iron 31 (*) 42 - 135 ug/dL   TIBC 155 (*) 250 - 470 ug/dL   Saturation Ratios 20  20 - 55 %   UIBC 124 (*) 125 - 400 ug/dL  FERRITIN     Status: None   Collection Time    02/05/13  3:27 AM      Result Value Range   Ferritin 127  10 - 291 ng/mL  GLUCOSE, CAPILLARY     Status: None   Collection Time    02/05/13  6:44 AM      Result Value Range   Glucose-Capillary 77  70 - 99 mg/dL   Comment 1 Notify RN     Comment 2 Documented in Chart    GLUCOSE, CAPILLARY     Status: Abnormal   Collection Time    02/05/13 10:44 AM      Result Value Range   Glucose-Capillary 109 (*) 70 - 99 mg/dL   Comment 1 Notify RN    GLUCOSE, CAPILLARY     Status: Abnormal   Collection Time    02/05/13  3:55 PM      Result Value Range   Glucose-Capillary 105 (*) 70 - 99 mg/dL  GLUCOSE, CAPILLARY     Status: Abnormal   Collection Time    02/05/13  9:19 PM      Result Value Range   Glucose-Capillary 120 (*) 70 - 99 mg/dL  SURGICAL PCR SCREEN     Status: None   Collection Time    02/05/13 10:21 PM      Result Value Range   MRSA, PCR NEGATIVE  NEGATIVE   Staphylococcus aureus NEGATIVE   NEGATIVE   Comment:            The Xpert SA Assay (FDA     approved for NASAL specimens     in patients over 52 years of age),     is one component of     a comprehensive surveillance     program.  Test performance has     been validated by [employer] for patients greater     than or equal to 36 year old.     It is not intended     to diagnose infection nor to     guide or monitor treatment.  RENAL FUNCTION PANEL     Status: None   Collection Time    02/06/13  4:00 AM      Result Value Range   Sodium 135  135 - 145 mEq/L   Comment: SPECIMEN CONTAMINATED, UNABLE TO PERFORM TEST(S).   Potassium SPECIMEN CONTAMINATED, UNABLE TO PERFORM TEST(S).  3.5 - 5.1 mEq/L   Chloride SPECIMEN CONTAMINATED, UNABLE TO PERFORM TEST(S).  96 - 112 mEq/L   Comment: CORRECTED ON 05/19 AT 0553: PREVIOUSLY REPORTED AS 104   CO2 SPECIMEN CONTAMINATED, UNABLE TO PERFORM TEST(S).  19 - 32 mEq/L   Comment: CORRECTED ON 05/19 AT 0553: PREVIOUSLY REPORTED AS 14   Glucose, Bld SPECIMEN CONTAMINATED, UNABLE TO PERFORM TEST(S).  70 - 99 mg/dL   Comment: CORRECTED ON 05/19 AT 0553: PREVIOUSLY REPORTED AS 136   BUN SPECIMEN CONTAMINATED, UNABLE TO PERFORM TEST(S).  6 - 23 mg/dL   Comment: CORRECTED ON 05/19 AT 0553: PREVIOUSLY REPORTED AS 40   Creatinine, Ser  SPECIMEN CONTAMINATED, UNABLE TO PERFORM TEST(S).  0.50 - 1.10 mg/dL   Comment: CORRECTED ON 05/19 AT 0553: PREVIOUSLY REPORTED AS 4.18   Calcium SPECIMEN CONTAMINATED, UNABLE TO PERFORM TEST(S).  8.4 - 10.5 mg/dL   Phosphorus SPECIMEN CONTAMINATED, UNABLE TO PERFORM TEST(S).  2.3 - 4.6 mg/dL   Comment: CORRECTED ON 05/19 AT 0553: PREVIOUSLY REPORTED AS 5.0   Albumin SPECIMEN CONTAMINATED, UNABLE TO PERFORM TEST(S).  3.5 - 5.2 g/dL   Comment: CORRECTED ON 05/19 AT 0553: PREVIOUSLY REPORTED AS 1.8   GFR calc non Af Amer SPECIMEN CONTAMINATED, UNABLE TO PERFORM TEST(S).  >90 mL/min   Comment: CORRECTED ON 05/19 AT 0553: PREVIOUSLY REPORTED AS 13   GFR  calc Af Amer SPECIMEN CONTAMINATED, UNABLE TO PERFORM TEST(S).  >90 mL/min   Comment: CORRECTED ON 05/19 AT 0553: PREVIOUSLY REPORTED AS 15        The eGFR has been calculated using the CKD EPI equation. This calculation has not been validated in all clinical situations. eGFR's persistently <90 mL/min signify possible Chronic Kidney      Disease.  GLUCOSE, CAPILLARY     Status: Abnormal   Collection Time    02/06/13  6:03 AM      Result Value Range   Glucose-Capillary 126 (*) 70 - 99 mg/dL   Comment 1 Notify RN    RENAL FUNCTION PANEL     Status: Abnormal   Collection Time    02/06/13  6:15 AM      Result Value Range   Sodium 135  135 - 145 mEq/L   Potassium 4.4  3.5 - 5.1 mEq/L   Chloride 106  96 - 112 mEq/L   CO2 16 (*) 19 - 32 mEq/L   Glucose, Bld 132 (*) 70 - 99 mg/dL   BUN 40 (*) 6 - 23 mg/dL   Creatinine, Ser 4.29 (*) 0.50 - 1.10 mg/dL   Calcium 8.7  8.4 - 10.5 mg/dL   Phosphorus 5.0 (*) 2.3 - 4.6 mg/dL   Albumin 2.0 (*) 3.5 - 5.2 g/dL   GFR calc non Af Amer 12 (*) >90 mL/min   GFR calc Af Amer 14 (*) >90 mL/min   Comment:            The eGFR has been calculated     using the CKD EPI equation.     This calculation has not been     validated in all clinical     situations.     eGFR's persistently     <90 mL/min signify     possible Chronic Kidney Disease.    No results found.  ROS:  No recent f/c/n/v/wt loss PE:  Blood pressure 152/89, pulse 103, temperature 98.2 F (36.8 C), temperature source Oral, resp. rate 22, height 5' 6.5" (1.689 m), weight 113.7 kg (250 lb 10.6 oz), last menstrual period 01/18/2013, SpO2 97.00%. wn wd woman in nad.  A and O.  Mood and affect normal.  EOMI.  Resp unlabored.  L foot with ulcer at the hallux.  No signifncat swelling.  Skin o/w intact.  Brisk cap refill at toes.  Sens to LT absent at the forefoot.  5/5 strength in PF and DF o the ankle.  MRI  / xrays reviewed - there is osteo of the proximal phalanx and pyarthrosis of the IP  joint.  Assessment/Plan: L hallux osteomyelitis - to OR FOr left hallux amputation.  The risks and benefits of the alternative treatment options have been discussed in detail.  The patient wishes to proceed with surgery and specifically understands risks of bleeding, infection, nerve damage, blood clots, need for additional surgery, amputation and death.   Wylene Simmer 02/22/2013, 7:48 AM

## 2013-02-06 NOTE — Anesthesia Preprocedure Evaluation (Addendum)
Anesthesia Evaluation  Patient identified by MRN, date of birth, ID band Patient awake    Reviewed: Allergy & Precautions, H&P , NPO status , Patient's Chart, lab work & pertinent test results  Airway       Dental   Pulmonary          Cardiovascular hypertension, Pt. on medications     Neuro/Psych    GI/Hepatic GERD-  Medicated and Controlled,  Endo/Other  diabetes, Poorly Controlled, Type 2, Insulin Dependent  Renal/GU CRFRenal disease     Musculoskeletal   Abdominal   Peds  Hematology   Anesthesia Other Findings   Reproductive/Obstetrics                          Anesthesia Physical Anesthesia Plan  ASA: III  Anesthesia Plan: General   Post-op Pain Management:    Induction: Intravenous  Airway Management Planned: LMA  Additional Equipment:   Intra-op Plan:   Post-operative Plan: Extubation in OR  Informed Consent: I have reviewed the patients History and Physical, chart, labs and discussed the procedure including the risks, benefits and alternatives for the proposed anesthesia with the patient or authorized representative who has indicated his/her understanding and acceptance.     Plan Discussed with: CRNA and Surgeon  Anesthesia Plan Comments:         Anesthesia Quick Evaluation

## 2013-02-06 NOTE — Progress Notes (Signed)
ANTIBIOTIC CONSULT NOTE - FOLLOW UP  Pharmacy Consult for vancomycin, imipenem Indication: osteomyelitis  Allergies  Allergen Reactions  . Morphine And Related Rash  . Penicillins Rash    Patient Measurements: Height: 5' 6.5" (168.9 cm) Weight: 250 lb 10.6 oz (113.7 kg) IBW/kg (Calculated) : 60.45  Vital Signs: Temp: 98.2 F (36.8 C) (05/19 0532) Temp src: Oral (05/19 0532) BP: 152/89 mmHg (05/19 0532) Pulse Rate: 103 (05/19 0532) Intake/Output from previous day: 05/18 0701 - 05/19 0700 In: 1180 [P.O.:580; IV Piggyback:600] Out: 2200 [Urine:2200] Intake/Output from this shift:    Labs:  Recent Labs  02/05/13 0327 02/06/13 0400 02/06/13 0615  CREATININE 4.85* SPECIMEN CONTAMINATED, UNABLE TO PERFORM TEST(S). 4.29*   Estimated Creatinine Clearance: 23.6 ml/min (by C-G formula based on Cr of 4.29). No results found for this basename: VANCOTROUGH, VANCOPEAK, VANCORANDOM, GENTTROUGH, GENTPEAK, GENTRANDOM, TOBRATROUGH, TOBRAPEAK, TOBRARND, AMIKACINPEAK, AMIKACINTROU, AMIKACIN,  in the last 72 hours     Assessment: 37 yo female with left forefoot osteomyelitis on vancomycin and imipenem.  Patient with SCr= 4.29 and antibiotics have been renally dosed.  Noted for L hallux amputation today.   Goal of Therapy:  Vancomycin trough level 15-20 mcg/ml  Plan:  -No antibiotic changed needed -Will check a vancomycin trough later this week -Will follow plans for length of therapy  Hildred Laser, Pharm D 02/06/2013 7:57 AM

## 2013-02-07 ENCOUNTER — Encounter (HOSPITAL_COMMUNITY): Payer: Self-pay | Admitting: Orthopedic Surgery

## 2013-02-07 DIAGNOSIS — I2589 Other forms of chronic ischemic heart disease: Secondary | ICD-10-CM

## 2013-02-07 DIAGNOSIS — I5041 Acute combined systolic (congestive) and diastolic (congestive) heart failure: Secondary | ICD-10-CM

## 2013-02-07 LAB — PROTEIN ELECTROPHORESIS, SERUM
Alpha-1-Globulin: 8.6 % — ABNORMAL HIGH (ref 2.9–4.9)
Gamma Globulin: 27 % — ABNORMAL HIGH (ref 11.1–18.8)
M-Spike, %: NOT DETECTED g/dL

## 2013-02-07 LAB — ANCA SCREEN W REFLEX TITER
Atypical p-ANCA Screen: NEGATIVE
p-ANCA Screen: POSITIVE — AB

## 2013-02-07 LAB — GLUCOSE, CAPILLARY
Glucose-Capillary: 135 mg/dL — ABNORMAL HIGH (ref 70–99)
Glucose-Capillary: 130 mg/dL — ABNORMAL HIGH (ref 70–99)
Glucose-Capillary: 103 mg/dL — ABNORMAL HIGH (ref 70–99)

## 2013-02-07 LAB — IMMUNOFIXATION ELECTROPHORESIS
IgA: 344 mg/dL (ref 69–380)
IgM, Serum: 73 mg/dL (ref 52–322)

## 2013-02-07 LAB — RENAL FUNCTION PANEL
BUN: 40 mg/dL — ABNORMAL HIGH (ref 6–23)
Calcium: 8.8 mg/dL (ref 8.4–10.5)
Creatinine, Ser: 4.15 mg/dL — ABNORMAL HIGH (ref 0.50–1.10)
Glucose, Bld: 142 mg/dL — ABNORMAL HIGH (ref 70–99)
Phosphorus: 5.7 mg/dL — ABNORMAL HIGH (ref 2.3–4.6)

## 2013-02-07 MED ORDER — ASPIRIN 81 MG PO CHEW: 81.0000 mg | CHEWABLE_TABLET | Freq: Every day | ORAL | Status: DC

## 2013-02-07 MED ORDER — SODIUM BICARBONATE 650 MG PO TABS
650.0000 mg | ORAL_TABLET | Freq: Three times a day (TID) | ORAL | Status: DC
  Administered 2013-02-07 – 2013-02-09 (×2): 650 mg via ORAL
  Filled 2013-02-07 (×12): qty 1

## 2013-02-07 MED ORDER — CARVEDILOL 6.25 MG PO TABS
6.2500 mg | ORAL_TABLET | Freq: Two times a day (BID) | ORAL | Status: DC
  Administered 2013-02-08 – 2013-02-10 (×5): 6.25 mg via ORAL
  Filled 2013-02-07 (×8): qty 1

## 2013-02-07 NOTE — Progress Notes (Signed)
Subjective: 1 Day Post-Op Procedure(s) (LRB): AMPUTATION LEFT GREAT TOE (Left) Patient reports pain as none.    Objective: Vital signs in last 24 hours: Temp:  [97.3 F (36.3 C)-98.5 F (36.9 C)] 98.5 F (36.9 C) (05/20 0600) Pulse Rate:  [91-110] 100 (05/20 0600) Resp:  [20-34] 20 (05/20 0600) BP: (153-187)/(87-112) 153/94 mmHg (05/20 0600) SpO2:  [91 %-95 %] 93 % (05/20 0600) Weight:  [112.719 kg (248 lb 8 oz)] 112.719 kg (248 lb 8 oz) (05/20 0600)  Intake/Output from previous day: 05/19 0701 - 05/20 0700 In: 1150 [P.O.:600; I.V.:350; IV Piggyback:200] Out: [id number] [Urine:1650; Stool:1; Blood:10] Intake/Output this shift: Total I/O In: 480 [P.O.:480] Out: 400 [Urine:400]  No results found for this basename: HGB,  in the last 72 hours No results found for this basename: WBC, RBC, HCT, PLT,  in the last 72 hours  Recent Labs  02/06/13 0615 02/07/13 0535  NA 135 136  K 4.4 4.7  CL 106 107  CO2 16* 17*  BUN 40* 40*  CREATININE 4.29* 4.15*  GLUCOSE 132* 142*  CALCIUM 8.7 8.8   No results found for this basename: LABPT, INR,  in the last 72 hours  left forefoot wound dressed and dry.  Cxs:  No growth to date.  No organisms on gram stain.  Assessment/Plan: 1 Day Post-Op Procedure(s) (LRB): AMPUTATION LEFT GREAT TOE (Left) WBAT on L LE with hard sole shoe.  Wylene Simmer 02/07/2013, 1:56 PM

## 2013-02-07 NOTE — Op Note (Signed)
Karen [name], ACHORN NO.:  [id number]  MEDICAL RECORD NO.:  [id number]  LOCATION:  61                         FACILITY:  [facility]  PHYSICIAN:  Wylene Simmer, MD        DATE OF BIRTH:  1975-11-06  DATE OF PROCEDURE:  02/06/2013 DATE OF DISCHARGE:                              OPERATIVE REPORT   PREOPERATIVE DIAGNOSIS:  Left hallux osteomyelitis and chronic diabetic ulcer.  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  PROCEDURE:  Left hallux amputation through the MTP joint.  SURGEON:  Wylene Simmer, MD.  ANESTHESIA:  General.  ESTIMATED BLOOD LOSS:  Minimal.  TOURNIQUET TIME:  Less than 10 minutes with an ankle Esmarch.  COMPLICATIONS:  None apparent.  DISPOSITION:  Extubated, awake, and stable to recovery.  SPECIMENS:  Deep tissue to Microbiology for aerobic and anaerobic culture, toe to Pathology.  INDICATIONS FOR PROCEDURE:  The patient is a 37 year old woman with past medical history significant for poorly controlled diabetes.  She has had an ulcer on the left hallux since 2009.  She was admitted late last week for an infection of her left foot.  She has an MRI which shows osteomyelitis of the proximal phalanx and pyarthrosis of the IP joint of her left hallux.  She presents now for amputation of the left hallux. She understands the risks, benefits, the alternative treatment options and elects surgical treatment.  She specifically understands risks of bleeding, infection, nerve damage, blood clots, need for additional surgery, revision amputation, and death.  PROCEDURE IN DETAIL:  After preoperative consent was obtained, the correct operative site was identified.  The patient was brought to the operating room and placed supine on the operating table.  General anesthesia was induced.  Preoperative antibiotics were administered. Surgical time-out was taken.  Left lower extremity was prepped and draped in standard  sterile fashion.  The foot was exsanguinated and a 4- inch Esmarch was wrapped around the ankle as a tourniquet.  A racquet style incision was marked around the base of the hallux.  Sharp dissection was carried down through skin and subcutaneous tissue.  The hallux was disarticulated at the level of the MP joint and passed off the field.  The FHL tendon was trimmed.  The wound was irrigated copiously.  The neurovascular bundles were cauterized.  The incision was then closed with horizontal mattress sutures of 3-0 nylon.  Sterile dressings were applied followed by compression wrap.  Tourniquet was released at less than 10 minutes.  The patient was awakened from anesthesia and transported to the recovery room in stable condition.  On the back table, a deep tissue specimen was obtained from the proximal phalanx and sent to Microbiology for aerobic and anaerobic culture.  FOLLOWUP PLAN:  The patient will be weightbearing as tolerated on her left foot and a hard-sole shoe.  She will follow up with me in 2 weeks. DVT prophylaxis will resume with heparin.  She will continue IV antibiotics per the primary team.     Wylene Simmer, MD     JH/MEDQ  D:  02/06/2013  T:  02/07/2013  Job:  [id number]

## 2013-02-07 NOTE — Progress Notes (Signed)
Orthopedic Tech Progress Note Patient Details:  Karen [name] 05-Mar-1976 [id number]  Ortho Devices Type of Ortho Device: Postop shoe/boot Ortho Device/Splint Location: left foot Ortho Device/Splint Interventions: Application   [name] 02/07/2013, 1:25 PM

## 2013-02-07 NOTE — Progress Notes (Signed)
Utilization Review Completed.   Greene Diodato, RN, BSN Nurse Case Manager  [phone]  

## 2013-02-07 NOTE — Progress Notes (Signed)
Patient ID: Karen [name], female   DOB: 19-Jun-1976, 37 y.o.   MRN: [id number]   [facility] Progress Note    Subjective:   Underwent left hallux MTP amputation yesterday. Cardiology note reviewed-beta blocker addition noted. She currently reports to be fairly comfortable.    Objective:   BP 153/94  Pulse 100  Temp(Src) 98.5 F (36.9 C) (Oral)  Resp 20  Ht 5' 6.5" (1.689 m)  Wt 112.719 kg (248 lb 8 oz)  BMI 39.51 kg/m2  SpO2 93%  LMP 01/18/2013  Intake/Output Summary (Last 24 hours) at 02/07/13 [id number] Last data filed at 02/07/13 0830  Gross per 24 hour  Intake   1390 ml  Output   1661 ml  Net   -271 ml   Weight change: -0.981 kg (-2 lb 2.6 oz)  Physical Exam: Gen: Comfortably resting in bed, watching something on her phone CVS: Pulse regular tachycardia, heart sounds S1 and S2 normal Resp: Clear to auscultation, no rales/rhonchi Abd: Soft, obese, nontender and bowel sounds are normal Ext: Trace bipedal edema, left foot in Ace wrap dressing-clean and dry  Imaging: Dg Chest 2 View  02/06/2013   *RADIOLOGY REPORT*  Clinical Data: Shortness of breath, CHF  CHEST - 2 VIEW  Comparison: Chest x-ray of 02/02/2013  Findings: Although aeration has improved, the lungs remain poorly aerated.  Cardiomegaly is present with pulmonary vascular congestion and possible effusions.  A right PICC line is present with its tip seen to the expected SVC - RA junction.  No pneumothorax is noted.  IMPRESSION: 1.  Slightly improved aeration although there is poor aeration overall.  Persistent cardiomegaly and moderate pulmonary vascular congestion. 2.  Right upper extremity PICC line tip near expected SVC - RA junction.   Original Report Authenticated By: Ivar Drape, M.D.    Labs: BMET  Recent Labs Lab 02/02/13 2254 02/03/13 0500 02/04/13 0530 02/05/13 0327 02/06/13 0400 02/06/13 0615 02/07/13 0535  NA  --  136 135 136 135 135 136  K  --  4.5 4.4 4.3 SPECIMEN CONTAMINATED, UNABLE  TO PERFORM TEST(S). 4.4 4.7  CL  --  107 108 107 SPECIMEN CONTAMINATED, UNABLE TO PERFORM TEST(S). 106 107  CO2  --  15* 16* 19 SPECIMEN CONTAMINATED, UNABLE TO PERFORM TEST(S). 16* 17*  GLUCOSE  --  120* 103* 88 SPECIMEN CONTAMINATED, UNABLE TO PERFORM TEST(S). 132* 142*  BUN  --  36* 38* 40* SPECIMEN CONTAMINATED, UNABLE TO PERFORM TEST(S). 40* 40*  CREATININE 4.65* 4.58* 4.82* 4.85* SPECIMEN CONTAMINATED, UNABLE TO PERFORM TEST(S). 4.29* 4.15*  CALCIUM  --  8.2* 8.4 8.7 SPECIMEN CONTAMINATED, UNABLE TO PERFORM TEST(S). 8.7 8.8  PHOS  --  4.9* 5.4* 5.6* SPECIMEN CONTAMINATED, UNABLE TO PERFORM TEST(S). 5.0* 5.7*   CBC  Recent Labs Lab 02/02/13 2254 02/03/13 0500  WBC 13.4* 11.5*  NEUTROABS  --  7.5  HGB 7.8* 7.5*  HCT 24.1* 23.1*  MCV 80.6 80.5  PLT 345 329    Medications:    . calcium carbonate  1 tablet Oral Q breakfast  . darbepoetin (ARANESP) injection - NON-DIALYSIS  100 mcg Subcutaneous Q Fri-1800  . furosemide  40 mg Intravenous BID  . heparin  5,000 Units Subcutaneous Q8H  . hydrALAZINE  50 mg Oral Q8H  . imipenem-cilastatin  250 mg Intravenous Q6H  . insulin aspart  0-15 Units Subcutaneous TID WC  . insulin aspart  0-5 Units Subcutaneous QHS  . insulin glargine  15 Units Subcutaneous QHS  . LORazepam  0.5 mg Oral Once  . metoprolol tartrate  25 mg Oral BID  . nystatin cream   Topical BID  . vancomycin  1,500 mg Intravenous Q48H     Assessment/ Plan:   1. Acute renal failure on chronic kidney disease versus progressive chronic kidney disease- renal function better about 2 years ago- She has nephrotic range proteinuria but minimal hematuria and it could be consistent only with poorly controlled DM and diabetic nephropathy vs something more ominous (chronic infection, possible secondary amyloid, multiple antibiotic exposures). Good UOP on lasix and slightly improved creatinine. - no indications for HD, not uremic. Given suspicion of possible GN-plan for renal biopsy  tomorrow to be done by IR  2. Anemia- on ESA, iron stores borderline low-will replete intravenously.  3. HTN/vol- high - on hydralazine 50 BID - Now on lasix with diuresis and is improving. Would continue moderate diuresis  4. Chronic left hallux osteomyelitis- primaxin and vanc appreciate ortho performing left hallux amputation 5. Metabolic acidosis: Start oral sodium bicarbonate.  Elmarie Shiley, MD 02/07/2013, 8:52 AM

## 2013-02-07 NOTE — Progress Notes (Signed)
Subjective:  No chest pain or SOB post op  Objective:  Vital Signs in the last 24 hours: Temp:  [97.3 F (36.3 C)-98.5 F (36.9 C)] 98.5 F (36.9 C) (05/20 0600) Pulse Rate:  [91-110] 100 (05/20 0600) Resp:  [20-34] 20 (05/20 0600) BP: (153-187)/(87-112) 153/94 mmHg (05/20 0600) SpO2:  [91 %-95 %] 93 % (05/20 0600) Weight:  [112.719 kg (248 lb 8 oz)] 112.719 kg (248 lb 8 oz) (05/20 0600)  Intake/Output from previous day:  Intake/Output Summary (Last 24 hours) at 02/07/13 1347 Last data filed at 02/07/13 1312  Gross per 24 hour  Intake   1380 ml  Output   1550 ml  Net   -170 ml    Physical Exam: General appearance: alert, cooperative, no distress and moderately obese Lungs: clear to auscultation bilaterally Heart: regular rate and rhythm   Rate: 98  Rhythm: sinus tachycardia  Lab Results: No results found for this basename: WBC, HGB, PLT,  in the last 72 hours  Recent Labs  02/06/13 0615 02/07/13 0535  NA 135 136  K 4.4 4.7  CL 106 107  CO2 16* 17*  GLUCOSE 132* 142*  BUN 40* 40*  CREATININE 4.29* 4.15*   No results found for this basename: TROPONINI, CK, MB,  in the last 72 hours Hepatic Function Panel  Recent Labs  02/07/13 0535  ALBUMIN 2.0*   No results found for this basename: CHOL,  in the last 72 hours No results found for this basename: INR,  in the last 72 hours  Imaging: Imaging results have been reviewed  Cardiac Studies:  Assessment/Plan:   Principal Problem:   Acute renal failure Active Problems:   Osteomyelitis of left great toe - S/P amputation 02/06/13   Acute combined systolic and diastolic congestive heart failure   DM (diabetes mellitus)  Type II   Essential hypertension   Cardiomyopathy, ischemic - EF 45-50% with inf WMA by 2D 02/05/13   Diabetic neuropathy   PAD (peripheral artery disease)    PLAN:  No further cardiac work up. No reason to do Myoview at this point as she is not a cath candidate and is not having  symptoms. We can see as an OP.  Kerin Ransom PA-C Beeper [id number] 02/07/2013, 1:47 PM   Agree with note written by Kerin Ransom PAC  Pt with what appears to be ISCM with EF 45% and inferior WMA but so symptoms. CRI. HTN/DM. On appropriate meds. Will change BB to coreg. No further w/u at this time. Not a cath candidate secondary to renal dysfunction.   Lorretta Harp 02/07/2013 1:51 PM

## 2013-02-07 NOTE — Progress Notes (Signed)
02/06/2013 2320 Patient complained of feeling that her feet feel cold. Earlier during shift assessment patient's feet were assessed and both were warm with capillary refill less than 3 seconds and pulse of 2+ on right foot. Dressing on left foot is clean, dry, and intact. After patient complained of having cool feet another assessment was done and doppler was also done. Both feet were warm with capillary refill less than 3 seconds and pulse was audible on doppler. Patient's surgeon Dr. Doran Durand was called and made aware. No new orders at this time, will continue to monitor.

## 2013-02-07 NOTE — Anesthesia Postprocedure Evaluation (Signed)
Anesthesia Post Note  Patient: Karen [name]  Procedure(s) Performed: Procedure(s) (LRB): AMPUTATION LEFT GREAT TOE (Left)  Anesthesia type: general  Patient location: PACU  Post pain: Pain level controlled  Post assessment: Patient's Cardiovascular Status Stable  Last Vitals:  Filed Vitals:   02/07/13 0600  BP: 153/94  Pulse: 100  Temp: 36.9 C  Resp: 20    Post vital signs: Reviewed and stable  Level of consciousness: sedated  Complications: No apparent anesthesia complications

## 2013-02-07 NOTE — Progress Notes (Signed)
Patient ID: Karen [name], female   DOB: [birth year], 37 y.o.   MRN: [id number] Request received for US guided random renal biopsy on pt with progressive CKD, nephrotic syndrome, DM, HTN, and anemia. Additional PMH as below. 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   Filed Vitals:   02/06/13 1741 02/06/13 2049 02/07/13 0500 02/07/13 0600  BP: 160/102 171/97  153/94  Pulse: 108 110  100  Temp: 97.3 F (36.3 C) 98 F (36.7 C)  98.5 F (36.9 C)  TempSrc: Oral Oral  Oral  Resp: 20 20  20   Height:      Weight:   248 lb 8 oz (112.719 kg) 248 lb 8 oz (112.719 kg)  SpO2: 93% 91%  93%   Past Medical History  Diagnosis Date  . Hypertension   . Anemia   . History of blood transfusion     "last week" (02/02/2013)  . GERD (gastroesophageal reflux disease)   . Osteomyelitis of toe of left foot     "off and on since 2009; no OR" (02/02/2013)  . Renal failure     acute vs chronic/notes 02/02/2013  . Type II diabetes mellitus 1995  . Exertional shortness of breath     "recently; it's fluid" (02/02/2013)   Past Surgical History  Procedure Laterality Date  . Cesarean section  10/18/2006   Dg Chest 2 View  02/06/2013   *RADIOLOGY REPORT*  Clinical Data: Shortness of breath, CHF  CHEST - 2 VIEW  Comparison: Chest x-ray of 02/02/2013  Findings: Although aeration has improved, the lungs remain poorly aerated.  Cardiomegaly is present with pulmonary vascular congestion and possible effusions.  A right PICC line is present with its tip seen to the expected SVC - RA junction.  No pneumothorax is noted.  IMPRESSION: 1.  Slightly improved aeration although there is poor aeration overall.  Persistent cardiomegaly and moderate pulmonary vascular congestion. 2.  Right upper extremity PICC line tip near expected SVC - RA junction.   Original Report Authenticated By: Ivar Drape, M.D.   US Renal  02/02/2013   *RADIOLOGY REPORT*  Clinical Data: Renal failure  RENAL/URINARY TRACT ULTRASOUND COMPLETE  Comparison:  None.  Findings:  Right Kidney:  12.8 cm. No hydronephrosis or renal mass.  Left Kidney:  14.0 cm. No hydronephrosis or renal mass.  Bladder:  Not fully distended.  No gross abnormality.  IMPRESSION: No hydronephrosis.   Original Report Authenticated By: Genia Del, M.D.   Mr Foot Left Wo Contrast  02/03/2013   *RADIOLOGY REPORT*  Clinical Data: Infection of the great toe.  MRI OF THE LEFT FOREFOOT WITHOUT CONTRAST  Technique:  Multiplanar, multisequence MR imaging was performed. No intravenous contrast was administered.  Comparison: Plain films the great toe this same date.  Findings: There is intense marrow edema throughout the great toe. The head of the proximal phalanx is fragmented and there is destructive change of the medial distal phalanx.  A large skin ulceration is seen on the medial side of the distal phalanx.  There is a small IP joint effusion with fluid tracking along the lateral aspect of the distal phalanx.  Bone marrow signal in the metatarsal head is normal and there is no first MTP joint effusion.  Intrinsic musculature of the foot demonstrates age advanced atrophy.  No mass is seen.  IMPRESSION: Findings consistent with osteomyelitis throughout the  great toe without extension into the first MTP joint or first metatarsal. Skin ulceration with fluid tracking about the distal phalanx compatible with abscess and septic IP joint of the great toe again noted.   Original Report Authenticated By: Orlean Patten, M.D.   Dg Foot Complete Left  02/03/2013   *RADIOLOGY REPORT*  Clinical Data: Diabetic with nonhealing osteomyelitis of the great toe.  LEFT FOOT - COMPLETE 3+ VIEW  Comparison: 01/25/2013.  Findings: Further osteolysis and destruction of the left first proximal phalanx distal aspect consistent with progressive osteomyelitis. Osteomyelitis also  involving the left first distal phalanx with adjacent cellulitis/ soft tissue ulceration.  IMPRESSION: Progressive osteolysis/osteomyelitis left first toe as noted above.   Original Report Authenticated By: Genia Del, M.D.   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  LEFT GREAT TOE  Comparison: 02/03/2013, 01/25/2013 and 12/24/2011  Findings: There is progressive bone destruction of the distal aspect of the proximal phalangeal bone of the great toe with a pathologic fracture.  The patient has had previous resection of a portion of the distal phalangeal bone.  No other change.  IMPRESSION: Progressive osteomyelitis with destruction of pathologic fracture of the distal aspect of the proximal phalangeal bone. The bone appears rarefied toward the base.  I suspect osteomyelitis involves the entire proximal phalangeal bone.   Original Report Authenticated By: Lorriane Shire, M.D.  Results for orders placed during the hospital encounter of 02/02/13  MRSA PCR SCREENING      Result Value Range   MRSA by PCR NEGATIVE  NEGATIVE  SURGICAL PCR SCREEN      Result Value Range   MRSA, PCR NEGATIVE  NEGATIVE   Staphylococcus aureus NEGATIVE  NEGATIVE  ANAEROBIC CULTURE      Result Value Range   Specimen Description TISSUE FOOT LEFT     Special Requests NONE DEEP TISSUE LEFT FOOT     Gram Stain       Value: NO WBC SEEN     NO SQUAMOUS EPITHELIAL CELLS SEEN     NO ORGANISMS SEEN   Culture PENDING     Report Status PENDING    TISSUE CULTURE      Result Value Range   Specimen Description TISSUE FOOT LEFT     Special Requests NONE DEEP TISSUE LEFT FOOT     Gram Stain       Value: NO WBC SEEN     NO SQUAMOUS EPITHELIAL CELLS SEEN     NO ORGANISMS SEEN   Culture NO GROWTH 1 DAY     Report Status PENDING    CBC      Result Value Range   WBC 13.4 (*) 4.0 - 10.5 K/uL   RBC 2.99 (*) 3.87 - 5.11 MIL/uL   Hemoglobin 7.8 (*) 12.0 - 15.0  g/dL   HCT 24.1 (*) 36.0 - 46.0 %   MCV 80.6  78.0 - 100.0 fL   MCH 26.1  26.0 - 34.0 pg   MCHC 32.4  30.0 - 36.0 g/dL   RDW 16.9 (*) 11.5 - 15.5 %   Platelets 345  150 - 400 K/uL  CREATININE, SERUM      Result Value Range   Creatinine, Ser 4.65 (*) 0.50 - 1.10 mg/dL   GFR calc non Af Amer 11 (*) >90 mL/min   GFR calc Af Amer 13 (*) >90 mL/min  HIV ANTIBODY (ROUTINE TESTING)      Result  Value Range   HIV NON REACTIVE  NON REACTIVE  HEPATITIS B SURFACE ANTIBODY      Result Value Range   Hep B S Ab NONREACTIVE  NONREACTIVE  HEPATITIS B E ANTIBODY      Result Value Range   Hep B E Ab Negative  Negative  HEPATITIS C ANTIBODY      Result Value Range   HCV Ab NEGATIVE  NEGATIVE  MPO/PR-3 (ANCA) ANTIBODIES      Result Value Range   Myeloperoxidase Abs 1  <20 AU/mL   Serine Protease 3 3  <20 AU/mL  ANA      Result Value Range   ANA NEGATIVE  NEGATIVE  HEMOGLOBIN A1C      Result Value Range   Hemoglobin A1C 7.2 (*) <5.7 %   Mean Plasma Glucose 160 (*) <117 mg/dL  RENAL FUNCTION PANEL      Result Value Range   Sodium 136  135 - 145 mEq/L   Potassium 4.5  3.5 - 5.1 mEq/L   Chloride 107  96 - 112 mEq/L   CO2 15 (*) 19 - 32 mEq/L   Glucose, Bld 120 (*) 70 - 99 mg/dL   BUN 36 (*) 6 - 23 mg/dL   Creatinine, Ser 4.58 (*) 0.50 - 1.10 mg/dL   Calcium 8.2 (*) 8.4 - 10.5 mg/dL   Phosphorus 4.9 (*) 2.3 - 4.6 mg/dL   Albumin 2.0 (*) 3.5 - 5.2 g/dL   GFR calc non Af Amer 11 (*) >90 mL/min   GFR calc Af Amer 13 (*) >90 mL/min  SODIUM, URINE, RANDOM      Result Value Range   Sodium, Ur 43    CREATININE, URINE, RANDOM      Result Value Range   Creatinine, Urine 70.45    URINALYSIS, ROUTINE W REFLEX MICROSCOPIC      Result Value Range   Color, Urine YELLOW  YELLOW   APPearance CLEAR  CLEAR   Specific Gravity, Urine 1.016  1.005 - 1.030   pH 6.0  5.0 - 8.0   Glucose, UA 250 (*) NEGATIVE mg/dL   Hgb urine dipstick MODERATE (*) NEGATIVE   Bilirubin Urine NEGATIVE  NEGATIVE    Ketones, ur NEGATIVE  NEGATIVE mg/dL   Protein, ur >300 (*) NEGATIVE mg/dL   Urobilinogen, UA 0.2  0.0 - 1.0 mg/dL   Nitrite NEGATIVE  NEGATIVE   Leukocytes, UA NEGATIVE  NEGATIVE  PROTEIN, URINE, 24 HOUR      Result Value Range   Urine Total Volume-UPROT 1900     Collection Interval-UPROT 24     Protein, Urine 713     Protein, 24H Urine 13547 (*) 50 - 100 mg/day  PROTEIN / CREATININE RATIO, URINE      Result Value Range   Creatinine, Urine 70.89     Total Protein, Urine 903     PROTEIN CREATININE RATIO 12.74 (*) 0.00 - 0.15  C3 COMPLEMENT      Result Value Range   C3 Complement 152  90 - 180 mg/dL  C4 COMPLEMENT      Result Value Range   Complement C4, Body Fluid 47 (*) 10 - 40 mg/dL  ANTISTREPTOLYSIN O TITER      Result Value Range   ASO 520 (*) <409 IU/mL  COMPLEMENT, TOTAL      Result Value Range   Compl, Total (CH50) >60 (*) 31 - 60 U/mL  PARATHYROID HORMONE, INTACT (NO CA)      Result Value  Range   PTH 116.5 (*) 14.0 - 72.0 pg/mL  IMMUNOFIXATION ELECTROPHORESIS, URINE (WITH TOT PROT)      Result Value Range   Time RANDOM     Volume, Urine RANDOM     Total Protein, Urine 532.1     Total Protein, Urine-Ur/day NOT CALC  10 - 140 mg/day   Albumin, U PENDING     Alpha 1, Urine PENDING     Alpha 2, Urine PENDING     Beta, Urine PENDING     Gamma Globulin, Urine PENDING     Free Kappa Lt Chains,Ur 47.80 (*) 0.14 - 2.42 mg/dL   Free Lt Chn Excr Rate NOT CALC     Free Lambda Lt Chains,Ur 13.20 (*) 0.02 - 0.67 mg/dL   Free Lambda Excretion/Day NOT CALC     Free Kappa/Lambda Ratio 3.62  2.04 - 10.37 ratio   Immunofixation, Urine PENDING    IMMUNOFIXATION ELECTROPHORESIS      Result Value Range   Total Protein ELP 6.4  6.0 - 8.3 g/dL   IgG (Immunoglobin G), Serum 1730 (*) 690 - 1700 mg/dL   IgA 344  69 - 380 mg/dL   IgM, Serum 73  52 - 322 mg/dL   Immunofix Electr Int PENDING    KAPPA/LAMBDA LIGHT CHAINS      Result Value Range   Kappa free light chain 13.90 (*)  0.33 - 1.94 mg/dL   Lamda free light chains 8.03 (*) 0.57 - 2.63 mg/dL   Kappa, lamda light chain ratio 1.73 (*) 0.26 - 1.65  GLUCOSE, CAPILLARY      Result Value Range   Glucose-Capillary 158 (*) 70 - 99 mg/dL  CBC WITH DIFFERENTIAL      Result Value Range   WBC 11.5 (*) 4.0 - 10.5 K/uL   RBC 2.87 (*) 3.87 - 5.11 MIL/uL   Hemoglobin 7.5 (*) 12.0 - 15.0 g/dL   HCT 23.1 (*) 36.0 - 46.0 %   MCV 80.5  78.0 - 100.0 fL   MCH 26.1  26.0 - 34.0 pg   MCHC 32.5  30.0 - 36.0 g/dL   RDW 17.0 (*) 11.5 - 15.5 %   Platelets 329  150 - 400 K/uL   Neutrophils Relative % 65  43 - 77 %   Neutro Abs 7.5  1.7 - 7.7 K/uL   Lymphocytes Relative 22  12 - 46 %   Lymphs Abs 2.5  0.7 - 4.0 K/uL   Monocytes Relative 9  3 - 12 %   Monocytes Absolute 1.0  0.1 - 1.0 K/uL   Eosinophils Relative 4  0 - 5 %   Eosinophils Absolute 0.5  0.0 - 0.7 K/uL   Basophils Relative 0  0 - 1 %   Basophils Absolute 0.0  0.0 - 0.1 K/uL  MAGNESIUM      Result Value Range   Magnesium 2.6 (*) 1.5 - 2.5 mg/dL  URINE MICROSCOPIC-ADD ON      Result Value Range   Squamous Epithelial / LPF RARE  RARE   RBC / HPF 3-6  <3 RBC/hpf   Bacteria, UA RARE  RARE  TROPONIN I      Result Value Range   Troponin I <0.30  <0.30 ng/mL  TROPONIN I      Result Value Range   Troponin I <0.30  <0.30 ng/mL  TROPONIN I      Result Value Range   Troponin I <0.30  <0.30 ng/mL  TSH  Result Value Range   TSH 1.157  0.350 - 4.500 uIU/mL  GLUCOSE, CAPILLARY      Result Value Range   Glucose-Capillary 104 (*) 70 - 99 mg/dL  SEDIMENTATION RATE      Result Value Range   Sed Rate >140 (*) 0 - 22 mm/hr  C-REACTIVE PROTEIN      Result Value Range   CRP 4.9 (*) <0.60 mg/dL  GLUCOSE, CAPILLARY      Result Value Range   Glucose-Capillary 133 (*) 70 - 99 mg/dL   Comment 1 Notify RN    RENAL FUNCTION PANEL      Result Value Range   Sodium 135  135 - 145 mEq/L   Potassium 4.4  3.5 - 5.1 mEq/L   Chloride 108  96 - 112 mEq/L   CO2 16 (*) 19 -  32 mEq/L   Glucose, Bld 103 (*) 70 - 99 mg/dL   BUN 38 (*) 6 - 23 mg/dL   Creatinine, Ser 4.82 (*) 0.50 - 1.10 mg/dL   Calcium 8.4  8.4 - 10.5 mg/dL   Phosphorus 5.4 (*) 2.3 - 4.6 mg/dL   Albumin 1.9 (*) 3.5 - 5.2 g/dL   GFR calc non Af Amer 11 (*) >90 mL/min   GFR calc Af Amer 12 (*) >90 mL/min  GLUCOSE, CAPILLARY      Result Value Range   Glucose-Capillary 127 (*) 70 - 99 mg/dL  GLUCOSE, CAPILLARY      Result Value Range   Glucose-Capillary 105 (*) 70 - 99 mg/dL   Comment 1 Documented in Chart     Comment 2 Notify RN    GLUCOSE, CAPILLARY      Result Value Range   Glucose-Capillary 88  70 - 99 mg/dL  GLUCOSE, CAPILLARY      Result Value Range   Glucose-Capillary 70  70 - 99 mg/dL  GLUCOSE, CAPILLARY      Result Value Range   Glucose-Capillary 94  70 - 99 mg/dL  RENAL FUNCTION PANEL      Result Value Range   Sodium 136  135 - 145 mEq/L   Potassium 4.3  3.5 - 5.1 mEq/L   Chloride 107  96 - 112 mEq/L   CO2 19  19 - 32 mEq/L   Glucose, Bld 88  70 - 99 mg/dL   BUN 40 (*) 6 - 23 mg/dL   Creatinine, Ser 4.85 (*) 0.50 - 1.10 mg/dL   Calcium 8.7  8.4 - 10.5 mg/dL   Phosphorus 5.6 (*) 2.3 - 4.6 mg/dL   Albumin 2.2 (*) 3.5 - 5.2 g/dL   GFR calc non Af Amer 11 (*) >90 mL/min   GFR calc Af Amer 12 (*) >90 mL/min  IRON AND TIBC      Result Value Range   Iron 31 (*) 42 - 135 ug/dL   TIBC 155 (*) 250 - 470 ug/dL   Saturation Ratios 20  20 - 55 %   UIBC 124 (*) 125 - 400 ug/dL  FERRITIN      Result Value Range   Ferritin 127  10 - 291 ng/mL  GLUCOSE, CAPILLARY      Result Value Range   Glucose-Capillary 101 (*) 70 - 99 mg/dL   Comment 1 Documented in Chart     Comment 2 Notify RN    GLUCOSE, CAPILLARY      Result Value Range   Glucose-Capillary 77  70 - 99 mg/dL   Comment 1 Notify RN  Comment 2 Documented in Chart    GLUCOSE, CAPILLARY      Result Value Range   Glucose-Capillary 109 (*) 70 - 99 mg/dL   Comment 1 Notify RN    GLUCOSE, CAPILLARY      Result Value  Range   Glucose-Capillary 105 (*) 70 - 99 mg/dL  RENAL FUNCTION PANEL      Result Value Range   Sodium 135  135 - 145 mEq/L   Potassium SPECIMEN CONTAMINATED, UNABLE TO PERFORM TEST(S).  3.5 - 5.1 mEq/L   Chloride SPECIMEN CONTAMINATED, UNABLE TO PERFORM TEST(S).  96 - 112 mEq/L   CO2 SPECIMEN CONTAMINATED, UNABLE TO PERFORM TEST(S).  19 - 32 mEq/L   Glucose, Bld SPECIMEN CONTAMINATED, UNABLE TO PERFORM TEST(S).  70 - 99 mg/dL   BUN SPECIMEN CONTAMINATED, UNABLE TO PERFORM TEST(S).  6 - 23 mg/dL   Creatinine, Ser SPECIMEN CONTAMINATED, UNABLE TO PERFORM TEST(S).  0.50 - 1.10 mg/dL   Calcium SPECIMEN CONTAMINATED, UNABLE TO PERFORM TEST(S).  8.4 - 10.5 mg/dL   Phosphorus SPECIMEN CONTAMINATED, UNABLE TO PERFORM TEST(S).  2.3 - 4.6 mg/dL   Albumin SPECIMEN CONTAMINATED, UNABLE TO PERFORM TEST(S).  3.5 - 5.2 g/dL   GFR calc non Af Amer SPECIMEN CONTAMINATED, UNABLE TO PERFORM TEST(S).  >90 mL/min   GFR calc Af Amer SPECIMEN CONTAMINATED, UNABLE TO PERFORM TEST(S).  >90 mL/min  GLUCOSE, CAPILLARY      Result Value Range   Glucose-Capillary 120 (*) 70 - 99 mg/dL  RENAL FUNCTION PANEL      Result Value Range   Sodium 135  135 - 145 mEq/L   Potassium 4.4  3.5 - 5.1 mEq/L   Chloride 106  96 - 112 mEq/L   CO2 16 (*) 19 - 32 mEq/L   Glucose, Bld 132 (*) 70 - 99 mg/dL   BUN 40 (*) 6 - 23 mg/dL   Creatinine, Ser 4.29 (*) 0.50 - 1.10 mg/dL   Calcium 8.7  8.4 - 10.5 mg/dL   Phosphorus 5.0 (*) 2.3 - 4.6 mg/dL   Albumin 2.0 (*) 3.5 - 5.2 g/dL   GFR calc non Af Amer 12 (*) >90 mL/min   GFR calc Af Amer 14 (*) >90 mL/min  GLUCOSE, CAPILLARY      Result Value Range   Glucose-Capillary 126 (*) 70 - 99 mg/dL   Comment 1 Notify RN    GLUCOSE, CAPILLARY      Result Value Range   Glucose-Capillary 101 (*) 70 - 99 mg/dL   Comment 1 Documented in Chart    GLUCOSE, CAPILLARY      Result Value Range   Glucose-Capillary 94  70 - 99 mg/dL   Comment 1 Notify RN    GLUCOSE, CAPILLARY      Result Value  Range   Glucose-Capillary 91  70 - 99 mg/dL  RENAL FUNCTION PANEL      Result Value Range   Sodium 136  135 - 145 mEq/L   Potassium 4.7  3.5 - 5.1 mEq/L   Chloride 107  96 - 112 mEq/L   CO2 17 (*) 19 - 32 mEq/L   Glucose, Bld 142 (*) 70 - 99 mg/dL   BUN 40 (*) 6 - 23 mg/dL   Creatinine, Ser 4.15 (*) 0.50 - 1.10 mg/dL   Calcium 8.8  8.4 - 10.5 mg/dL   Phosphorus 5.7 (*) 2.3 - 4.6 mg/dL   Albumin 2.0 (*) 3.5 - 5.2 g/dL   GFR calc non Af Amer 13 (*) >  90 mL/min   GFR calc Af Amer 15 (*) >90 mL/min  GLUCOSE, CAPILLARY      Result Value Range   Glucose-Capillary 108 (*) 70 - 99 mg/dL   Comment 1 Documented in Chart     Comment 2 Notify RN    GLUCOSE, CAPILLARY      Result Value Range   Glucose-Capillary 135 (*) 70 - 99 mg/dL   Comment 1 Documented in Chart     Comment 2 Notify RN     A/P: Pt with progressive CKD, nephrotic syndrome, HTN, DM, anemia. Tent plan is for US guided random renal biopsy on 5/21. Details/risks of procedure d/w pt with her understanding and consent.

## 2013-02-07 NOTE — Progress Notes (Signed)
[facility] PROGRESS NOTE  Karen [name] [id number] DOB: May 01, 1976 DOA: 02/02/2013 PCP: [name], MD  Assessment/Plan: Principal Problem:   Acute renal failure Active Problems:   Osteomyelitis of left great toe    DM (diabetes mellitus)  Type II   Diabetic neuropathy   Acute combined systolic and diastolic congestive heart failure   Essential hypertension   PAD (peripheral artery disease)   Cardiomyopathy, ischemic - EF 45-50% with inf WMA by 2D 02/05/13    Sustained ventricular tachycardia/artifact  no true arrhythmia involved  flutter-like artifact  Patient is asymptomatic, electrolytes normal  Given one dose of IV metoprolol,  Discontinued levofloxacin   cardiac enzymes negative  2-D echo EF of 45-50%  Cardiology consultation from [facility] heart and vascular  She remains tachycardic with HR in the low 100s    Osteomyelitis  Discontinued levofloxacin  Switched to vancomycin/imipenem  Consulted orthopedics, Dr. 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  Skin ulceration with fluid tracking about the distal phalanx  compatible with abscess and septic IP joint of the great toe again  noted.  OR today with Dr. Doran Durand  on 5/90 Postoperative culture pending Orthopedics recommends continuation of IV antibiotics    Diabetes  Continue sliding scale insulin   Acute on chronic renal failure  Renal ultrasound No hydronephrosis  Renal fn slightly worse today  Complement levels normal  Appreciate nephrology input  comp nml, HIV and hep neg, anti MP3 and protease neg  She has nephrotic range proteinuria but minimal hematuria and it could be consistent only with poorly controlled DM and diabetic nephropathy vs something more ominous. Good UOP on lasix and  slightly improved creatinine No indication for hemodialysis at this time Plan is for renal biopsy   CHF  Fluid restriction and Lasix  2-D echo pending  very likely this could be hypertensive nephropathy +/- cardiomyopathy  EF: 45% - 50% increase of hydralazine to 50 mg TID  Echo suggests an ischemic etiology with focal inferior wall motion abnormalities. Started on metoprolol 5/19    Hypertension  Continue clonidine, hold off on ACE/ARB secondary to renal failure  on hydralazine 50 milligrams 3 times a day    Code Status: full  Family Communication: family updated about patient's clinical progress  Disposition Plan: As above    Brief narrative:  Karen [name] is a 37 y.o. female  Who was transferred from [facility] with concerns for acute renal failure necessitating a nephrology workup. Briefly, the patient has a history of poorly controlled as well as a history of "on and off" osteomyelitis of the left toe since 2009. In fact she was recently discharged from [facility] for treatment of osteomyelitis of the toe. There none hospital course she was treated with vancomycin and was discharged with Levaquin. During the hospital stay, the patient was noted to have evidence of acute renal failure with a creatinine of over 3, baseline creatinine of 1-2 per documentation. The patient was subsequently discharged from the hospital admission on 01/30/2013. Shortly afterwards, the patient noted gradually "swelling all over". At this time,the creatinine was found to be 4.1 with a calculated GFR of around 15. The patient showed signs of volume overload and was subsequently given a dose of IV Lasix. She was then transferred to [facility] for further management  She status post MTP amputation on 5/19, continue antibiotics per orthopedics She has been seen by cardiology most likely has ischemic cardiomyopathy She has been seen by nephrology for possible GN-may need renal biopsy  later during the week Renal functions gradually improving with diuresis, renal ultrasound shows no hydronephrosis    Consultants:  Procedures:  Antibiotics:  Levaquin discontinue  Vancomycin/imipenem starting 5/16 HPI/Subjective:  Denies any chest pain any shortness of breath,    Objective: Filed Vitals:   02/06/13 1630 02/06/13 1741 02/06/13 2049 02/07/13 0600  BP: 172/96 160/102 171/97 153/94  Pulse: 107 108 110 100  Temp:  97.3 F (36.3 C) 98 F (36.7 C) 98.5 F (36.9 C)  TempSrc:  Oral Oral Oral  Resp:  20 20 20   Height:      Weight:    112.719 kg (248 lb 8 oz)  SpO2:  93% 91% 93%    Intake/Output Summary (Last 24 hours) at 02/07/13 0755 Last data filed at 02/07/13 [id number]  Gross per 24 hour  Intake   1150 ml  Output   1661 ml  Net   -511 ml    Exam:  General appearance: alert, cooperative, no distress and morbidly obese  Lungs: clear to auscultation bilaterally  Heart: regular rate and rhythm  Extrem: Trace edema    Data Reviewed: Basic Metabolic Panel:  Recent Labs Lab 02/02/13 2254  02/03/13 0500 02/04/13 0530 02/05/13 0327 02/06/13 0400 02/06/13 0615 02/07/13 0535  NA  --   < > 136 135 136 135 135 136  K  --   < > 4.5 4.4 4.3 SPECIMEN CONTAMINATED, UNABLE TO PERFORM TEST(S). 4.4 4.7  CL  --   < > 107 108 107 SPECIMEN CONTAMINATED, UNABLE TO PERFORM TEST(S). 106 107  CO2  --   < > 15* 16* 19 SPECIMEN CONTAMINATED, UNABLE TO PERFORM TEST(S). 16* 17*  GLUCOSE  --   < > 120* 103* 88 SPECIMEN CONTAMINATED, UNABLE TO PERFORM TEST(S). 132* 142*  BUN  --   < > 36* 38* 40* SPECIMEN CONTAMINATED, UNABLE TO PERFORM TEST(S). 40* 40*  CREATININE 4.65*  --  4.58* 4.82* 4.85* SPECIMEN CONTAMINATED, UNABLE TO PERFORM TEST(S). 4.29* 4.15*  CALCIUM  --   < > 8.2* 8.4 8.7 SPECIMEN CONTAMINATED, UNABLE TO PERFORM TEST(S). 8.7 8.8  MG  --   --  2.6*  --   --   --   --   --   PHOS  --   < > 4.9* 5.4* 5.6* SPECIMEN CONTAMINATED, UNABLE TO PERFORM TEST(S). 5.0* 5.7*   < > = values in this interval not displayed.  Liver Function Tests:  Recent Labs Lab 02/04/13 0530 02/05/13 0327 02/06/13 0400 02/06/13 0615 02/07/13 0535  ALBUMIN 1.9* 2.2* SPECIMEN CONTAMINATED, UNABLE TO PERFORM TEST(S). 2.0* 2.0*   No results found for this basename: LIPASE, AMYLASE,  in the last 168 hours No results found for this basename: AMMONIA,  in the last 168 hours  CBC:  Recent Labs Lab 02/02/13 2254 02/03/13 0500  WBC 13.4* 11.5*  NEUTROABS  --  7.5  HGB 7.8* 7.5*  HCT 24.1* 23.1*  MCV 80.6 80.5  PLT 345 329    Cardiac Enzymes:  Recent Labs Lab 02/03/13 0835 02/03/13 1750 02/03/13 1959  TROPONINI <0.30 <0.30 <0.30   BNP (last 3 results) No results found for this basename: PROBNP,  in the last 8760 hours   CBG:  Recent Labs Lab 02/06/13 1112 02/06/13 1338 02/06/13 1540 02/06/13 2126 02/07/13 [id number]  GLUCAP 101* 94 91 108* 135*    Recent Results (from the past 240 hour(s))  MRSA PCR SCREENING     Status: None   Collection Time    02/02/13  8:24 PM      Result Value Range Status   MRSA by PCR NEGATIVE  NEGATIVE Final   Comment:            The GeneXpert MRSA Assay (FDA     approved for NASAL specimens     only), is one component of a     comprehensive MRSA colonization     surveillance program. It is not     intended to diagnose MRSA     infection nor to guide or     monitor treatment for     MRSA infections.  SURGICAL PCR SCREEN     Status: None   Collection Time    02/05/13 10:21 PM      Result Value Range Status   MRSA, PCR NEGATIVE  NEGATIVE Final   Staphylococcus aureus NEGATIVE  NEGATIVE Final   Comment:            The Xpert SA Assay (FDA     approved for NASAL specimens     in patients over 65 years of age),     is one component of     a comprehensive surveillance     program.  Test performance has     been validated by [employer] for patients greater     than or equal to 31 year old.     It is not intended      to diagnose infection nor to     guide or monitor treatment.  ANAEROBIC CULTURE     Status: None   Collection Time    02/06/13  1:34 PM      Result Value Range Status   Specimen Description TISSUE FOOT LEFT   Final   Special Requests NONE DEEP TISSUE LEFT FOOT   Final   Gram Stain     Final   Value: NO WBC SEEN     NO SQUAMOUS EPITHELIAL CELLS SEEN     NO ORGANISMS SEEN   Culture PENDING   Incomplete   Report Status PENDING   Incomplete  TISSUE CULTURE     Status: None   Collection Time    02/06/13  1:34 PM      Result Value Range Status   Specimen Description TISSUE FOOT LEFT   Final   Special Requests NONE DEEP TISSUE LEFT FOOT   Final   Gram Stain     Final   Value: NO WBC SEEN     NO SQUAMOUS EPITHELIAL CELLS SEEN     NO ORGANISMS SEEN   Culture PENDING   Incomplete   Report Status PENDING   Incomplete     Studies: Dg Chest 2 View  02/06/2013   *RADIOLOGY REPORT*  Clinical Data: Shortness of breath, CHF  CHEST - 2 VIEW  Comparison: Chest x-ray of 02/02/2013  Findings: Although aeration has improved, the lungs remain poorly aerated.  Cardiomegaly is present with pulmonary vascular congestion and possible effusions.  A right PICC line is present with its tip seen to the expected SVC - RA junction.  No pneumothorax is noted.  IMPRESSION: 1.  Slightly improved aeration although there is poor aeration overall.  Persistent cardiomegaly and moderate pulmonary vascular congestion. 2.  Right upper extremity PICC line tip near  expected SVC - RA junction.   Original Report Authenticated By: Ivar Drape, M.D.   US Renal  02/02/2013   *RADIOLOGY REPORT*  Clinical Data: Renal failure  RENAL/URINARY TRACT ULTRASOUND COMPLETE  Comparison:  None.  Findings:  Right Kidney:  12.8 cm. No hydronephrosis or renal mass.  Left Kidney:  14.0 cm. No hydronephrosis or renal mass.  Bladder:  Not fully distended.  No gross abnormality.  IMPRESSION: No hydronephrosis.   Original Report Authenticated By:  Genia Del, M.D.   Mr Foot Left Wo Contrast  02/03/2013   *RADIOLOGY REPORT*  Clinical Data: Infection of the great toe.  MRI OF THE LEFT FOREFOOT WITHOUT CONTRAST  Technique:  Multiplanar, multisequence MR imaging was performed. No intravenous contrast was administered.  Comparison: Plain films the great toe this same date.  Findings: There is intense marrow edema throughout the great toe. The head of the proximal phalanx is fragmented and there is destructive change of the medial distal phalanx.  A large skin ulceration is seen on the medial side of the distal phalanx.  There is a small IP joint effusion with fluid tracking along the lateral aspect of the distal phalanx.  Bone marrow signal in the metatarsal head is normal and there is no first MTP joint effusion.  Intrinsic musculature of the foot demonstrates age advanced atrophy.  No mass is seen.  IMPRESSION: Findings consistent with osteomyelitis throughout the great toe without extension into the first MTP joint or first metatarsal. Skin ulceration with fluid tracking about the distal phalanx compatible with abscess and septic IP joint of the great toe again noted.   Original Report Authenticated By: Orlean Patten, M.D.   Dg Foot Complete Left  02/03/2013   *RADIOLOGY REPORT*  Clinical Data: Diabetic with nonhealing osteomyelitis of the great toe.  LEFT FOOT - COMPLETE 3+ VIEW  Comparison: 01/25/2013.  Findings: Further osteolysis and destruction of the left first proximal phalanx distal aspect consistent with progressive osteomyelitis. Osteomyelitis also involving the left first distal phalanx with adjacent cellulitis/ soft tissue ulceration.  IMPRESSION: Progressive osteolysis/osteomyelitis left first toe as noted above.   Original Report Authenticated By: Genia Del, M.D.   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  LEFT GREAT TOE  Comparison: 02/03/2013, 01/25/2013 and  12/24/2011  Findings: There is progressive bone destruction of the distal aspect of the proximal phalangeal bone of the great toe with a pathologic fracture.  The patient has had previous resection of a portion of the distal phalangeal bone.  No other change.  IMPRESSION: Progressive osteomyelitis with destruction of pathologic fracture of the distal aspect of the proximal phalangeal bone. The bone appears rarefied toward the base.  I suspect osteomyelitis involves the entire proximal phalangeal bone.   Original Report Authenticated By: Lorriane Shire, M.D.    Scheduled Meds: . calcium carbonate  1 tablet Oral Q breakfast  . darbepoetin (ARANESP) injection - NON-DIALYSIS  100 mcg Subcutaneous Q Fri-1800  . furosemide  40 mg Intravenous BID  . heparin  5,000 Units Subcutaneous Q8H  . hydrALAZINE  50 mg Oral Q8H  . imipenem-cilastatin  250 mg Intravenous Q6H  . insulin aspart  0-15 Units Subcutaneous TID WC  . insulin aspart  0-5 Units Subcutaneous QHS  . insulin glargine  15 Units Subcutaneous QHS  . LORazepam  0.5 mg Oral Once  . metoprolol tartrate  25 mg Oral BID  . nystatin cream  Topical BID  . vancomycin  1,500 mg Intravenous Q48H   Continuous Infusions: . sodium chloride 20 mL/hr (02/06/13 1157)    Principal Problem:   Acute renal failure Active Problems:   Osteomyelitis of left great toe    DM (diabetes mellitus)  Type II   Diabetic neuropathy   Acute combined systolic and diastolic congestive heart failure   Essential hypertension   PAD (peripheral artery disease)   Cardiomyopathy, ischemic - EF 45-50% with inf WMA by 2D 02/05/13    Time spent: 40 minutes   [facility] Pager [phone]. If 8PM-8AM, please contact night-coverage at [email or web address], password [facility] 02/07/2013, 7:55 AM  LOS: 5 days

## 2013-02-08 ENCOUNTER — Inpatient Hospital Stay (HOSPITAL_COMMUNITY): Payer: Medicaid Other

## 2013-02-08 LAB — UIFE/LIGHT CHAINS/TP QN, 24-HR UR
Beta, Urine: DETECTED — AB
Free Kappa Lt Chains,Ur: 47.8 mg/dL — ABNORMAL HIGH (ref 0.14–2.42)
Free Lambda Lt Chains,Ur: 13.2 mg/dL — ABNORMAL HIGH (ref 0.02–0.67)
Gamma Globulin, Urine: DETECTED — AB

## 2013-02-08 LAB — RENAL FUNCTION PANEL
BUN: 37 mg/dL — ABNORMAL HIGH (ref 6–23)
Chloride: 105 mEq/L (ref 96–112)
Creatinine, Ser: 3.95 mg/dL — ABNORMAL HIGH (ref 0.50–1.10)
Glucose, Bld: 76 mg/dL (ref 70–99)
Phosphorus: 5.2 mg/dL — ABNORMAL HIGH (ref 2.3–4.6)
Potassium: 4.1 mEq/L (ref 3.5–5.1)

## 2013-02-08 LAB — GLUCOSE, CAPILLARY
Glucose-Capillary: 72 mg/dL (ref 70–99)
Glucose-Capillary: 111 mg/dL — ABNORMAL HIGH (ref 70–99)
Glucose-Capillary: 123 mg/dL — ABNORMAL HIGH (ref 70–99)

## 2013-02-08 LAB — CBC
HCT: 22.7 % — ABNORMAL LOW (ref 36.0–46.0)
MCV: 81.7 fL (ref 78.0–100.0)
RDW: 18.3 % — ABNORMAL HIGH (ref 11.5–15.5)
WBC: 11.7 10*3/uL — ABNORMAL HIGH (ref 4.0–10.5)

## 2013-02-08 LAB — APTT: aPTT: 37 seconds (ref 24–37)

## 2013-02-08 MED ORDER — DOXYCYCLINE HYCLATE 100 MG PO TABS
100.0000 mg | ORAL_TABLET | Freq: Two times a day (BID) | ORAL | Status: DC
  Administered 2013-02-08 – 2013-02-10 (×5): 100 mg via ORAL
  Filled 2013-02-08 (×6): qty 1

## 2013-02-08 MED ORDER — MIDAZOLAM HCL 2 MG/2ML IJ SOLN
INTRAMUSCULAR | Status: DC | PRN
  Administered 2013-02-08: 0.5 mg via INTRAVENOUS
  Administered 2013-02-08: 1 mg via INTRAVENOUS
  Administered 2013-02-08: 0.5 mg via INTRAVENOUS
  Administered 2013-02-08: 1 mg via INTRAVENOUS

## 2013-02-08 MED ORDER — FUROSEMIDE 40 MG PO TABS
40.0000 mg | ORAL_TABLET | Freq: Two times a day (BID) | ORAL | Status: DC
  Administered 2013-02-08 – 2013-02-10 (×4): 40 mg via ORAL
  Filled 2013-02-08 (×6): qty 1

## 2013-02-08 MED ORDER — BENZONATATE 100 MG PO CAPS
100.0000 mg | ORAL_CAPSULE | Freq: Three times a day (TID) | ORAL | Status: DC | PRN
  Administered 2013-02-09: 100 mg via ORAL
  Filled 2013-02-08 (×3): qty 1

## 2013-02-08 MED ORDER — FENTANYL CITRATE 0.05 MG/ML IJ SOLN
INTRAMUSCULAR | Status: DC | PRN
  Administered 2013-02-08: 50 ug via INTRAVENOUS
  Administered 2013-02-08: 25 ug via INTRAVENOUS
  Administered 2013-02-08: 50 ug via INTRAVENOUS
  Administered 2013-02-08: 25 ug via INTRAVENOUS

## 2013-02-08 NOTE — Progress Notes (Signed)
Subjective: 2 Days Post-Op Procedure(s) (LRB): AMPUTATION LEFT GREAT TOE (Left) Patient reports pain as 0 on 0-10 scale.    Objective: Vital signs in last 24 hours: Temp:  [97.5 F (36.4 C)-98.5 F (36.9 C)] 97.5 F (36.4 C) (05/21 1439) Pulse Rate:  [92-101] 94 (05/21 1439) Resp:  [18-35] 24 (05/21 1035) BP: (137-182)/(85-115) 137/85 mmHg (05/21 1439) SpO2:  [92 %-97 %] 92 % (05/21 1439) Weight:  [112.6 kg (248 lb 3.8 oz)] 112.6 kg (248 lb 3.8 oz) (05/21 [id number])  Intake/Output from previous day: 05/20 0701 - 05/21 0700 In: 720 [P.O.:720] Out: 1800 [Urine:1800] Intake/Output this shift: Total I/O In: 240 [P.O.:240] Out: -    Recent Labs  02/08/13 0500  HGB 7.3*    Recent Labs  02/08/13 0500  WBC 11.7*  RBC 2.78*  HCT 22.7*  PLT 327    Recent Labs  02/07/13 0535 02/08/13 0500  NA 136 136  K 4.7 4.1  CL 107 105  CO2 17* 16*  BUN 40* 37*  CREATININE 4.15* 3.95*  GLUCOSE 142* 76  CALCIUM 8.8 8.8    Recent Labs  02/08/13 0500  INR 1.15    PE:  left foot dressed and dry.  Assessment/Plan: 2 Days Post-Op Procedure(s) (LRB): AMPUTATION LEFT GREAT TOE (Left) Plan for discharge tomorrow per pt.  She can be discharged to home at any time from my perspective.  F/u info in Epic,  I'll sign off.  Please call with any questions.  [phone].  Wylene Simmer 02/08/2013, 5:43 PM

## 2013-02-08 NOTE — Procedures (Signed)
Technically successful US guided biopsy of the left kidney.  No immediate complications.

## 2013-02-08 NOTE — Plan of Care (Signed)
Problem: Phase II Progression Outcomes Goal: Obtain order to discontinue catheter if appropriate Outcome: Completed/Met Date Met:  02/08/13 Pt no longer has foley, pt voiding  Problem: Phase III Progression Outcomes Goal: Pain controlled on oral analgesia Outcome: Completed/Met Date Met:  02/08/13 Pt has not had any c/o pain Goal: Foley discontinued Outcome: Completed/Met Date Met:  02/08/13 Pt voiding, no foley

## 2013-02-08 NOTE — Consult Note (Signed)
I have reviewed the patient's history and examination in detail with Mr. Eddie Dibbles, and have generated the plan for further management.  Altamese Howard Lake, MD [facility] [phone] (p)

## 2013-02-08 NOTE — Progress Notes (Signed)
[facility] PROGRESS NOTE  Karen [name] [id number] DOB: 1976/03/28 DOA: 02/02/2013 PCP: [name], MD  Assessment/Plan: 1-acute on chronic renal failure with signs of fluid overload: per renal service. Renal biopsy to be done today. Renal function stable/slightly improved. Good urine output and no indication for hemodialysis at this point.  2-diabetes mellitus type 2: Hemoglobin A1c 7.2. Continue current regimen and sliding scale insulin.  3-ischemic cardiomyopathy with systolic heart failure: Ejection fraction 35-40%, at this point given the degree of renal failure has been recommended by cardiologist. Medical therapy has been adjusted, patient denies any chest pain or shortness of breath. Will continue hydralazine, Lasix and coreg. No ACE inhibitors or ARB due to renal failure.  4-anemia: Do to anemia of chronic disease and iron deficiency (most likely associated with menstrual cycles). Continue Aranesp and IV iron as recommended by renal service. No transfusion at this moment. Follow Hgb trend  5-left hallux osteomyelitis: Status post amputation. Will change antibiotics to doxycycline and will treat for 7 more days to continue therapy. No signs of sepsis.  6-hypertension: Continue hydralazine, Lasix and coreg. Low sodium diet also recommended. If BP still elevated at discharge will change hydralazine for bidil.  DVT: heparin  Code Status: Full code Family Communication: No family at bedside. Disposition Plan: Home with medical stable. (Most likely 1-2 days)   Consultants:  Renal service  [facility] cardiology  Orthopedic service  Procedures:  See below for x-ray reports  Left hallux amputation (5/19)  Renal biopsy (plan for today 5/21)  Antibiotics:  Vanc/primaxin 5/15--5/21  Doxycycline 5/21  HPI/Subjective: Afebrile, denies chest pain, no shortness of breath. Patient is status post left hallux amputation. Plan is for renal biopsy today. According to  patient good urine output, no nausea, no vomiting, no abdominal discomfort.  Objective: Filed Vitals:   02/08/13 1001 02/08/13 1015 02/08/13 1020 02/08/13 1025  BP: 143/95 177/106 181/115   Pulse: 101 95 93 97  Temp:      TempSrc:      Resp: 35 27 22 26   Height:      Weight:      SpO2: 97% 94% 95% 94%    Intake/Output Summary (Last 24 hours) at 02/08/13 1026 Last data filed at 02/08/13 [id number]  Gross per 24 hour  Intake    480 ml  Output   1800 ml  Net  -1320 ml   Filed Weights   02/07/13 0500 02/07/13 0600 02/08/13 0637  Weight: 112.719 kg (248 lb 8 oz) 112.719 kg (248 lb 8 oz) 112.6 kg (248 lb 3.8 oz)    Exam:   General:  NAD, afebrile, mild cough spells during interview  Cardiovascular: S1 and S2, regular rate and rhythm, no murmurs or gallops.  Respiratory: Good air movement bilaterally, no rales, no crackles  Abdomen: Obese, soft, nontender, nondistended, positive bowel sounds  Musculoskeletal: Left lower extremity with clean ACE wrapping, trace to 1 + edema bilaterally  Neuro: no focal deficit  Data Reviewed: Basic Metabolic Panel:  Recent Labs Lab 02/02/13 2254 02/03/13 0500  02/05/13 0327 02/06/13 0400 02/06/13 0615 02/07/13 0535 02/08/13 0500  NA  --  136  < > 136 135 135 136 136  K  --  4.5  < > 4.3 SPECIMEN CONTAMINATED, UNABLE TO PERFORM TEST(S). 4.4 4.7 4.1  CL  --  107  < > 107 SPECIMEN CONTAMINATED, UNABLE TO PERFORM TEST(S). 106 107 105  CO2  --  15*  < > 19 SPECIMEN CONTAMINATED, UNABLE TO PERFORM TEST(S). 16* 17*  16*  GLUCOSE  --  120*  < > 88 SPECIMEN CONTAMINATED, UNABLE TO PERFORM TEST(S). 132* 142* 76  BUN  --  36*  < > 40* SPECIMEN CONTAMINATED, UNABLE TO PERFORM TEST(S). 40* 40* 37*  CREATININE 4.65* 4.58*  < > 4.85* SPECIMEN CONTAMINATED, UNABLE TO PERFORM TEST(S). 4.29* 4.15* 3.95*  CALCIUM  --  8.2*  < > 8.7 SPECIMEN CONTAMINATED, UNABLE TO PERFORM TEST(S). 8.7 8.8 8.8  MG  --  2.6*  --   --   --   --   --   --   PHOS  --  4.9*  <  > 5.6* SPECIMEN CONTAMINATED, UNABLE TO PERFORM TEST(S). 5.0* 5.7* 5.2*  < > = values in this interval not displayed. Liver Function Tests:  Recent Labs Lab 02/05/13 0327 02/06/13 0400 02/06/13 0615 02/07/13 0535 02/08/13 0500  ALBUMIN 2.2* SPECIMEN CONTAMINATED, UNABLE TO PERFORM TEST(S). 2.0* 2.0* 2.0*   CBC:  Recent Labs Lab 02/02/13 2254 02/03/13 0500 02/08/13 0500  WBC 13.4* 11.5* 11.7*  NEUTROABS  --  7.5  --   HGB 7.8* 7.5* 7.3*  HCT 24.1* 23.1* 22.7*  MCV 80.6 80.5 81.7  PLT 345 329 327   Cardiac Enzymes:  Recent Labs Lab 02/03/13 0835 02/03/13 1750 02/03/13 1959  TROPONINI <0.30 <0.30 <0.30   BNP (last 3 results)  Recent Labs  02/07/13 0535  PROBNP 16430.0*   CBG:  Recent Labs Lab 02/07/13 0626 02/07/13 1128 02/07/13 1555 02/07/13 2132 02/08/13 0618  GLUCAP 135* 130* 94 103* 72    Recent Results (from the past 240 hour(s))  MRSA PCR SCREENING     Status: None   Collection Time    02/02/13  8:24 PM      Result Value Range Status   MRSA by PCR NEGATIVE  NEGATIVE Final   Comment:            The GeneXpert MRSA Assay (FDA     approved for NASAL specimens     only), is one component of a     comprehensive MRSA colonization     surveillance program. It is not     intended to diagnose MRSA     infection nor to guide or     monitor treatment for     MRSA infections.  SURGICAL PCR SCREEN     Status: None   Collection Time    02/05/13 10:21 PM      Result Value Range Status   MRSA, PCR NEGATIVE  NEGATIVE Final   Staphylococcus aureus NEGATIVE  NEGATIVE Final   Comment:            The Xpert SA Assay (FDA     approved for NASAL specimens     in patients over 42 years of age),     is one component of     a comprehensive surveillance     program.  Test performance has     been validated by [employer] for patients greater     than or equal to 8 year old.     It is not intended     to diagnose infection nor to     guide or monitor  treatment.  ANAEROBIC CULTURE     Status: None   Collection Time    02/06/13  1:34 PM      Result Value Range Status   Specimen Description TISSUE FOOT LEFT   Final   Special Requests NONE DEEP  TISSUE LEFT FOOT   Final   Gram Stain     Final   Value: NO WBC SEEN     NO SQUAMOUS EPITHELIAL CELLS SEEN     NO ORGANISMS SEEN   Culture     Final   Value: NO ANAEROBES ISOLATED; CULTURE IN PROGRESS FOR 5 DAYS   Report Status PENDING   Incomplete  TISSUE CULTURE     Status: None   Collection Time    02/06/13  1:34 PM      Result Value Range Status   Specimen Description TISSUE FOOT LEFT   Final   Special Requests NONE DEEP TISSUE LEFT FOOT   Final   Gram Stain     Final   Value: NO WBC SEEN     NO SQUAMOUS EPITHELIAL CELLS SEEN     NO ORGANISMS SEEN   Culture NO GROWTH 2 DAYS   Final   Report Status PENDING   Incomplete     Studies: Dg Chest 2 View  02/06/2013   *RADIOLOGY REPORT*  Clinical Data: Shortness of breath, CHF  CHEST - 2 VIEW  Comparison: Chest x-ray of 02/02/2013  Findings: Although aeration has improved, the lungs remain poorly aerated.  Cardiomegaly is present with pulmonary vascular congestion and possible effusions.  A right PICC line is present with its tip seen to the expected SVC - RA junction.  No pneumothorax is noted.  IMPRESSION: 1.  Slightly improved aeration although there is poor aeration overall.  Persistent cardiomegaly and moderate pulmonary vascular congestion. 2.  Right upper extremity PICC line tip near expected SVC - RA junction.   Original Report Authenticated By: Ivar Drape, M.D.    Scheduled Meds: . calcium carbonate  1 tablet Oral Q breakfast  . carvedilol  6.25 mg Oral BID WC  . darbepoetin (ARANESP) injection - NON-DIALYSIS  100 mcg Subcutaneous Q Fri-1800  . doxycycline  100 mg Oral Q12H  . furosemide  40 mg Oral BID  . heparin  5,000 Units Subcutaneous Q8H  . hydrALAZINE  50 mg Oral Q8H  . insulin aspart  0-15 Units Subcutaneous TID WC  .  insulin aspart  0-5 Units Subcutaneous QHS  . insulin glargine  15 Units Subcutaneous QHS  . LORazepam  0.5 mg Oral Once  . nystatin cream   Topical BID  . sodium bicarbonate  650 mg Oral TID   Continuous Infusions: . sodium chloride 20 mL/hr (02/06/13 1157)    Principal Problem:   Acute renal failure Active Problems:   Osteomyelitis of left great toe - S/P amputation 02/06/13   DM (diabetes mellitus)  Type II   Diabetic neuropathy   Acute combined systolic and diastolic congestive heart failure   Essential hypertension   PAD (peripheral artery disease)   Cardiomyopathy, ischemic - EF 45-50% with inf WMA by 2D 02/05/13    Time spent: >30 minutes    Anushree Dorsi  [facility] Pager [phone]. If 7PM-7AM, please contact night-coverage at [email or web address], password [facility] 02/08/2013, 10:26 AM  LOS: 6 days

## 2013-02-08 NOTE — Progress Notes (Signed)
Patient ID: Karen [name], female   DOB: 1975-11-20, 37 y.o.   MRN: [id number]   [facility] Progress Note    Subjective:   Status post renal biopsy earlier today, denies any pain of her left lower extremity    Objective:   BP 150/91  Pulse 96  Temp(Src) 98.5 F (36.9 C) (Oral)  Resp 18  Ht 5' 6.5" (1.689 m)  Wt 112.6 kg (248 lb 3.8 oz)  BMI 39.47 kg/m2  SpO2 95%  LMP 01/18/2013  Intake/Output Summary (Last 24 hours) at 02/08/13 0824 Last data filed at 02/08/13 [id number]  Gross per 24 hour  Intake    720 ml  Output   1800 ml  Net  -1080 ml   Weight change: -0.119 kg (-4.2 oz)  Physical Exam: Gen: Laying comfortably in bed CVS: Pulse regular in rate and rhythm, heart sounds S1 and S2 normal Resp: Clear to auscultation bilaterally-no rales/rhonchi Abd: Soft, obese, nontender and bowel sounds are normal Ext: One plus lower extremity edema-left lower extremity in ACE wrapping  Imaging: Dg Chest 2 View  02/06/2013   *RADIOLOGY REPORT*  Clinical Data: Shortness of breath, CHF  CHEST - 2 VIEW  Comparison: Chest x-ray of 02/02/2013  Findings: Although aeration has improved, the lungs remain poorly aerated.  Cardiomegaly is present with pulmonary vascular congestion and possible effusions.  A right PICC line is present with its tip seen to the expected SVC - RA junction.  No pneumothorax is noted.  IMPRESSION: 1.  Slightly improved aeration although there is poor aeration overall.  Persistent cardiomegaly and moderate pulmonary vascular congestion. 2.  Right upper extremity PICC line tip near expected SVC - RA junction.   Original Report Authenticated By: Ivar Drape, M.D.    Labs: BMET  Recent Labs Lab 02/03/13 0500 02/04/13 0530 02/05/13 0327 02/06/13 0400 02/06/13 0615 02/07/13 0535 02/08/13 0500  NA 136 135 136 135 135 136 136  K 4.5 4.4 4.3 SPECIMEN CONTAMINATED, UNABLE TO PERFORM TEST(S). 4.4 4.7 4.1  CL 107 108 107 SPECIMEN CONTAMINATED, UNABLE TO PERFORM  TEST(S). 106 107 105  CO2 15* 16* 19 SPECIMEN CONTAMINATED, UNABLE TO PERFORM TEST(S). 16* 17* 16*  GLUCOSE 120* 103* 88 SPECIMEN CONTAMINATED, UNABLE TO PERFORM TEST(S). 132* 142* 76  BUN 36* 38* 40* SPECIMEN CONTAMINATED, UNABLE TO PERFORM TEST(S). 40* 40* 37*  CREATININE 4.58* 4.82* 4.85* SPECIMEN CONTAMINATED, UNABLE TO PERFORM TEST(S). 4.29* 4.15* 3.95*  CALCIUM 8.2* 8.4 8.7 SPECIMEN CONTAMINATED, UNABLE TO PERFORM TEST(S). 8.7 8.8 8.8  PHOS 4.9* 5.4* 5.6* SPECIMEN CONTAMINATED, UNABLE TO PERFORM TEST(S). 5.0* 5.7* 5.2*   CBC  Recent Labs Lab 02/02/13 2254 02/03/13 0500 02/08/13 0500  WBC 13.4* 11.5* 11.7*  NEUTROABS  --  7.5  --   HGB 7.8* 7.5* 7.3*  HCT 24.1* 23.1* 22.7*  MCV 80.6 80.5 81.7  PLT 345 329 327    Medications:    . calcium carbonate  1 tablet Oral Q breakfast  . carvedilol  6.25 mg Oral BID WC  . darbepoetin (ARANESP) injection - NON-DIALYSIS  100 mcg Subcutaneous Q Fri-1800  . furosemide  40 mg Intravenous BID  . heparin  5,000 Units Subcutaneous Q8H  . hydrALAZINE  50 mg Oral Q8H  . imipenem-cilastatin  250 mg Intravenous Q6H  . insulin aspart  0-15 Units Subcutaneous TID WC  . insulin aspart  0-5 Units Subcutaneous QHS  . insulin glargine  15 Units Subcutaneous QHS  . LORazepam  0.5 mg Oral Once  . nystatin  cream   Topical BID  . sodium bicarbonate  650 mg Oral TID  . vancomycin  1,500 mg Intravenous Q48H     Assessment/ Plan:   1. Acute renal failure on chronic kidney disease versus progressive chronic kidney disease- renal function better about 2 years ago- She has nephrotic range proteinuria but minimal hematuria and it could be consistent only with poorly controlled DM and diabetic nephropathy vs something more ominous (chronic infection, possible secondary amyloid, multiple antibiotic exposures). Good UOP on lasix and slightly improved creatinine. - no indications for HD, not uremic. Renal biopsy done today. 2. Anemia- on ESA, iron stores  borderline low-repleted. Monitor for post-biopsy drop 3. HTN/vol- high - on hydralazine 50 BID - Now on lasix with diuresis and is improving. Would continue moderate diuresis  4. Chronic left hallux osteomyelitis- primaxin and vanc appreciate ortho performing left hallux amputation  5. Metabolic acidosis: Started on oral sodium bicarbonate-would up titrate dosing as indicated.   Elmarie Shiley, MD 02/08/2013, 8:24 AM

## 2013-02-09 LAB — TISSUE CULTURE

## 2013-02-09 LAB — GLUCOSE, CAPILLARY: Glucose-Capillary: 124 mg/dL — ABNORMAL HIGH (ref 70–99)

## 2013-02-09 LAB — RENAL FUNCTION PANEL
CO2: 16 mEq/L — ABNORMAL LOW (ref 19–32)
Calcium: 8.6 mg/dL (ref 8.4–10.5)
GFR calc Af Amer: 17 mL/min — ABNORMAL LOW (ref >90)
GFR calc non Af Amer: 14 mL/min — ABNORMAL LOW (ref >90)
Phosphorus: 4.9 mg/dL — ABNORMAL HIGH (ref 2.3–4.6)
Potassium: 4.2 mEq/L (ref 3.5–5.1)
Sodium: 133 mEq/L — ABNORMAL LOW (ref 135–145)

## 2013-02-09 LAB — CBC
MCHC: 31.7 g/dL (ref 30.0–36.0)
RDW: 18.4 % — ABNORMAL HIGH (ref 11.5–15.5)
WBC: 11.7 10*3/uL — ABNORMAL HIGH (ref 4.0–10.5)

## 2013-02-09 MED ORDER — NEPRO/CARBSTEADY PO LIQD
237.0000 mL | Freq: Two times a day (BID) | ORAL | Status: DC
  Administered 2013-02-09 – 2013-02-10 (×3): 237 mL via ORAL
  Filled 2013-02-09 (×6): qty 237

## 2013-02-09 MED ORDER — FLEET ENEMA 7-19 GM/118ML RE ENEM
1.0000 | ENEMA | Freq: Two times a day (BID) | RECTAL | Status: DC | PRN
  Filled 2013-02-09 (×2): qty 1

## 2013-02-09 NOTE — Progress Notes (Signed)
[facility] PROGRESS NOTE  Karen [name] [id number] DOB: [birth year] DOA: 02/02/2013 PCP: [name], MD  Assessment/Plan: 1-acute on chronic renal failure with signs of fluid overload: per renal service. Renal biopsy done on 5/21. Renal function slightly improved. Good urine output and no indication for hemodialysis at this point. If continue to be stable will discharge home tomorrow.  2-diabetes mellitus type 2: Hemoglobin A1c 7.2. Continue current regimen and sliding scale insulin.  3-ischemic cardiomyopathy with systolic heart failure: Ejection fraction 35-40%, at this point given the degree of renal failure has been recommended by cardiologist. Medical therapy has been adjusted, patient denies any chest pain or shortness of breath. Will continue hydralazine, Lasix and coreg. No ACE inhibitors or ARB due to renal failure. No chest pain.  4-anemia: Do to anemia of chronic disease and iron deficiency (most likely associated with menstrual cycles). Continue Aranesp and IV iron as recommended by renal service. No transfusion at this moment. Hgb 7.2  5-left hallux osteomyelitis: Status post amputation. Will continue doxycycline for 6 more days. No signs of sepsis. Follow up with Dr. Doran Durand as an outpatient.  6-hypertension: Continue hydralazine, Lasix and coreg. Low sodium diet also recommended. SBP in the 120's to 130's  DVT: heparin  Code Status: Full code Family Communication: No family at bedside. Disposition Plan: Home with medical stable. (Most likely 1-2 days)   Consultants:  Renal service  [facility] cardiology  Orthopedic service  Procedures:  See below for x-ray reports  Left hallux amputation (5/19)  Renal biopsy (plan for today 5/21)  Antibiotics:  Vanc/primaxin 5/15--5/21  Doxycycline 5/21  HPI/Subjective: Afebrile, denies chest pain, no shortness of breath. Reports she wants to go home.  Objective: Filed Vitals:   02/08/13 1439 02/08/13 2126  02/09/13 0622 02/09/13 1315  BP: 137/85 141/69 149/87 123/84  Pulse: 94 100 101 93  Temp: 97.5 F (36.4 C) 97.8 F (36.6 C) 97.8 F (36.6 C) 97.8 F (36.6 C)  TempSrc: Oral Oral Oral Oral  Resp:   20 20  Height:      Weight:   113.3 kg (249 lb 12.5 oz)   SpO2: 92% 100% 90% 92%    Intake/Output Summary (Last 24 hours) at 02/09/13 1550 Last data filed at 02/09/13 0700  Gross per 24 hour  Intake    480 ml  Output    750 ml  Net   -270 ml   Filed Weights   02/07/13 0600 02/08/13 0637 02/09/13 0622  Weight: 112.719 kg (248 lb 8 oz) 112.6 kg (248 lb 3.8 oz) 113.3 kg (249 lb 12.5 oz)    Exam:   General:  NAD, afebrile, mild cough spells during interview  Cardiovascular: S1 and S2, regular rate and rhythm, no murmurs or gallops.  Respiratory: Good air movement bilaterally, no rales, no crackles  Abdomen: Obese, soft, nontender, nondistended, positive bowel sounds  Musculoskeletal: Left lower extremity with clean ACE wrapping, trace to 1 + edema bilaterally  Neuro: no focal deficit  Data Reviewed: Basic Metabolic Panel:  Recent Labs Lab 02/02/13 2254 02/03/13 0500  02/06/13 0400 02/06/13 0615 02/07/13 0535 02/08/13 0500 02/09/13 0536  NA  --  136  < > 135 135 136 136 133*  K  --  4.5  < > SPECIMEN CONTAMINATED, UNABLE TO PERFORM TEST(S). 4.4 4.7 4.1 4.2  CL  --  107  < > SPECIMEN CONTAMINATED, UNABLE TO PERFORM TEST(S). 106 107 105 103  CO2  --  15*  < > SPECIMEN CONTAMINATED, UNABLE TO PERFORM  TEST(S). 16* 17* 16* 16*  GLUCOSE  --  120*  < > SPECIMEN CONTAMINATED, UNABLE TO PERFORM TEST(S). 132* 142* 76 131*  BUN  --  36*  < > SPECIMEN CONTAMINATED, UNABLE TO PERFORM TEST(S). 40* 40* 37* 40*  CREATININE 4.65* 4.58*  < > SPECIMEN CONTAMINATED, UNABLE TO PERFORM TEST(S). 4.29* 4.15* 3.95* 3.79*  CALCIUM  --  8.2*  < > SPECIMEN CONTAMINATED, UNABLE TO PERFORM TEST(S). 8.7 8.8 8.8 8.6  MG  --  2.6*  --   --   --   --   --   --   PHOS  --  4.9*  < > SPECIMEN  CONTAMINATED, UNABLE TO PERFORM TEST(S). 5.0* 5.7* 5.2* 4.9*  < > = values in this interval not displayed. Liver Function Tests:  Recent Labs Lab 02/06/13 0400 02/06/13 0615 02/07/13 0535 02/08/13 0500 02/09/13 0536  ALBUMIN SPECIMEN CONTAMINATED, UNABLE TO PERFORM TEST(S). 2.0* 2.0* 2.0* 1.9*   CBC:  Recent Labs Lab 02/02/13 2254 02/03/13 0500 02/08/13 0500 02/09/13 0536  WBC 13.4* 11.5* 11.7* 11.7*  NEUTROABS  --  7.5  --   --   HGB 7.8* 7.5* 7.3* 7.2*  HCT 24.1* 23.1* 22.7* 22.7*  MCV 80.6 80.5 81.7 81.9  PLT 345 329 327 292   Cardiac Enzymes:  Recent Labs Lab 02/03/13 0835 02/03/13 1750 02/03/13 1959  TROPONINI <0.30 <0.30 <0.30   BNP (last 3 results)  Recent Labs  02/07/13 0535  PROBNP 16430.0*   CBG:  Recent Labs Lab 02/08/13 1107 02/08/13 1610 02/08/13 2107 02/09/13 0657 02/09/13 1126  GLUCAP 87 111* 123* 124* 155*    Recent Results (from the past 240 hour(s))  MRSA PCR SCREENING     Status: None   Collection Time    02/02/13  8:24 PM      Result Value Range Status   MRSA by PCR NEGATIVE  NEGATIVE Final   Comment:            The GeneXpert MRSA Assay (FDA     approved for NASAL specimens     only), is one component of a     comprehensive MRSA colonization     surveillance program. It is not     intended to diagnose MRSA     infection nor to guide or     monitor treatment for     MRSA infections.  SURGICAL PCR SCREEN     Status: None   Collection Time    02/05/13 10:21 PM      Result Value Range Status   MRSA, PCR NEGATIVE  NEGATIVE Final   Staphylococcus aureus NEGATIVE  NEGATIVE Final   Comment:            The Xpert SA Assay (FDA     approved for NASAL specimens     in patients over 21 years of age),     is one component of     a comprehensive surveillance     program.  Test performance has     been validated by [employer] for patients greater     than or equal to 54 year old.     It is not intended     to diagnose  infection nor to     guide or monitor treatment.  ANAEROBIC CULTURE     Status: None   Collection Time    02/06/13  1:34 PM      Result Value Range Status  Specimen Description TISSUE FOOT LEFT   Final   Special Requests NONE DEEP TISSUE LEFT FOOT   Final   Gram Stain     Final   Value: NO WBC SEEN     NO SQUAMOUS EPITHELIAL CELLS SEEN     NO ORGANISMS SEEN   Culture     Final   Value: NO ANAEROBES ISOLATED; CULTURE IN PROGRESS FOR 5 DAYS   Report Status PENDING   Incomplete  TISSUE CULTURE     Status: None   Collection Time    02/06/13  1:34 PM      Result Value Range Status   Specimen Description TISSUE FOOT LEFT   Final   Special Requests NONE DEEP TISSUE LEFT FOOT   Final   Gram Stain     Final   Value: NO WBC SEEN     NO SQUAMOUS EPITHELIAL CELLS SEEN     NO ORGANISMS SEEN   Culture NO GROWTH 3 DAYS   Final   Report Status 02/09/2013 FINAL   Final     Studies: US Biopsy  02/08/2013   *RADIOLOGY REPORT*  Indication: Nephrotic syndrome, progressive chronic kidney disease  ULTRASOUND GUIDED RENAL BIOPSY  Comparison: Renal ultrasound - 02/02/2013  Medications: Fentanyl 150 mcg IV; Versed 3 mg IV  Total Moderate Sedation time: 23 minutes  Complications: None immediate  Procedure:  Informed written consent was obtained from the patient after a discussion of the risks, benefits and alternatives to treatment. The patient understands and consents to the procedure.  A timeout was performed prior to the initiation of the procedure.  Ultrasound scanning was performed of the bilateral flanks.  The inferior pole of the left kidney was selected for biopsy due to location and sonographic window.  The procedure was planned.  The operative site was prepped and draped in the usual sterile fashion. The overlying soft tissues were anesthetized with 1% lidocaine with epinephrine.  A 17 gauge core needle biopsy device was advanced into the inferior cortex of the left kidney and 3 core biopsies were  obtained under direct ultrasound guidance.  Real time pathologic review confirmed adequate tissue acquisition.  Images were saved for documentation purposes.  The biopsy device was removed and hemostasis was obtained with manual compression.  Post procedural scanning was negative for significant post procedural hemorrhage or additional complication.  A dressing was placed.  The patient tolerated the procedure well without immediate post procedural complication.  Impression:  Technically successful ultrasound guided left renal biopsy.   Original Report Authenticated By: Jake Seats, MD    Scheduled Meds: . calcium carbonate  1 tablet Oral Q breakfast  . carvedilol  6.25 mg Oral BID WC  . darbepoetin (ARANESP) injection - NON-DIALYSIS  100 mcg Subcutaneous Q Fri-1800  . doxycycline  100 mg Oral Q12H  . feeding supplement (NEPRO CARB STEADY)  237 mL Oral BID BM  . furosemide  40 mg Oral BID  . heparin  5,000 Units Subcutaneous Q8H  . hydrALAZINE  50 mg Oral Q8H  . insulin aspart  0-15 Units Subcutaneous TID WC  . insulin aspart  0-5 Units Subcutaneous QHS  . insulin glargine  15 Units Subcutaneous QHS  . LORazepam  0.5 mg Oral Once  . nystatin cream   Topical BID  . sodium bicarbonate  650 mg Oral TID   Continuous Infusions: . sodium chloride 20 mL/hr (02/06/13 1157)    Principal Problem:   Acute renal failure Active Problems:   Osteomyelitis  of left great toe - S/P amputation 02/06/13   DM (diabetes mellitus)  Type II   Diabetic neuropathy   Acute combined systolic and diastolic congestive heart failure   Essential hypertension   PAD (peripheral artery disease)   Cardiomyopathy, ischemic - EF 45-50% with inf WMA by 2D 02/05/13    Time spent: >30 minutes    Indyah Saulnier  [facility] Pager [phone]. If 7PM-7AM, please contact night-coverage at [email or web address], password [facility] 02/09/2013, 3:50 PM  LOS: 7 days

## 2013-02-09 NOTE — Plan of Care (Signed)
Problem: Phase III Progression Outcomes Goal: Voiding independently Outcome: Completed/Met Date Met:  02/09/13 Pt voiding adequate amts of urine Goal: Discharge plan remains appropriate-arrangements made Outcome: Completed/Met Date Met:  02/09/13 Plan for dc 02/10/13 to home

## 2013-02-09 NOTE — Progress Notes (Signed)
Patient ID: Karen [name], female   DOB: 1976/08/11, 36 y.o.   MRN: [id number]   [facility] Progress Note    Subjective:   Reports to be feeling well, denies any shortness of breath. Inquires about going home today.    Objective:   BP 149/87  Pulse 101  Temp(Src) 97.8 F (36.6 C) (Oral)  Resp 20  Ht 5' 6.5" (1.689 m)  Wt 113.3 kg (249 lb 12.5 oz)  BMI 39.72 kg/m2  SpO2 90%  LMP 01/18/2013  Intake/Output Summary (Last 24 hours) at 02/09/13 0804 Last data filed at 02/09/13 0700  Gross per 24 hour  Intake    720 ml  Output    750 ml  Net    -30 ml   Weight change: 0.7 kg (1 lb 8.7 oz)  Physical Exam: Gen: Comfortably sitting up in bed, eating breakfast CVS: Pulse regular tachycardia, S1 and S2 normal Resp: Clear to auscultation bilaterally-no rales/rhonchi Abd: Soft, obese, nontender and bowel sounds are normal Ext: One plus lower extremity edema-left foot in clean/intact dressing  Imaging: US Biopsy  02/08/2013   *RADIOLOGY REPORT*  Indication: Nephrotic syndrome, progressive chronic kidney disease  ULTRASOUND GUIDED RENAL BIOPSY  Comparison: Renal ultrasound - 02/02/2013  Medications: Fentanyl 150 mcg IV; Versed 3 mg IV  Total Moderate Sedation time: 23 minutes  Complications: None immediate  Procedure:  Informed written consent was obtained from the patient after a discussion of the risks, benefits and alternatives to treatment. The patient understands and consents to the procedure.  A timeout was performed prior to the initiation of the procedure.  Ultrasound scanning was performed of the bilateral flanks.  The inferior pole of the left kidney was selected for biopsy due to location and sonographic window.  The procedure was planned.  The operative site was prepped and draped in the usual sterile fashion. The overlying soft tissues were anesthetized with 1% lidocaine with epinephrine.  A 17 gauge core needle biopsy device was advanced into the inferior cortex of  the left kidney and 3 core biopsies were obtained under direct ultrasound guidance.  Real time pathologic review confirmed adequate tissue acquisition.  Images were saved for documentation purposes.  The biopsy device was removed and hemostasis was obtained with manual compression.  Post procedural scanning was negative for significant post procedural hemorrhage or additional complication.  A dressing was placed.  The patient tolerated the procedure well without immediate post procedural complication.  Impression:  Technically successful ultrasound guided left renal biopsy.   Original Report Authenticated By: Jake Seats, MD    Labs: BMET  Recent Labs Lab 02/04/13 0530 02/05/13 0327 02/06/13 0400 02/06/13 0615 02/07/13 0535 02/08/13 0500 02/09/13 0536  NA 135 136 135 135 136 136 133*  K 4.4 4.3 SPECIMEN CONTAMINATED, UNABLE TO PERFORM TEST(S). 4.4 4.7 4.1 4.2  CL 108 107 SPECIMEN CONTAMINATED, UNABLE TO PERFORM TEST(S). 106 107 105 103  CO2 16* 19 SPECIMEN CONTAMINATED, UNABLE TO PERFORM TEST(S). 16* 17* 16* 16*  GLUCOSE 103* 88 SPECIMEN CONTAMINATED, UNABLE TO PERFORM TEST(S). 132* 142* 76 131*  BUN 38* 40* SPECIMEN CONTAMINATED, UNABLE TO PERFORM TEST(S). 40* 40* 37* 40*  CREATININE 4.82* 4.85* SPECIMEN CONTAMINATED, UNABLE TO PERFORM TEST(S). 4.29* 4.15* 3.95* 3.79*  CALCIUM 8.4 8.7 SPECIMEN CONTAMINATED, UNABLE TO PERFORM TEST(S). 8.7 8.8 8.8 8.6  PHOS 5.4* 5.6* SPECIMEN CONTAMINATED, UNABLE TO PERFORM TEST(S). 5.0* 5.7* 5.2* 4.9*   CBC  Recent Labs Lab 02/02/13 2254 02/03/13 0500 02/08/13 0500 02/09/13 0536  WBC 13.4* 11.5* 11.7* 11.7*  NEUTROABS  --  7.5  --   --   HGB 7.8* 7.5* 7.3* 7.2*  HCT 24.1* 23.1* 22.7* 22.7*  MCV 80.6 80.5 81.7 81.9  PLT 345 329 327 292    Medications:    . calcium carbonate  1 tablet Oral Q breakfast  . carvedilol  6.25 mg Oral BID WC  . darbepoetin (ARANESP) injection - NON-DIALYSIS  100 mcg Subcutaneous Q Fri-1800  . doxycycline  100  mg Oral Q12H  . furosemide  40 mg Oral BID  . heparin  5,000 Units Subcutaneous Q8H  . hydrALAZINE  50 mg Oral Q8H  . insulin aspart  0-15 Units Subcutaneous TID WC  . insulin aspart  0-5 Units Subcutaneous QHS  . insulin glargine  15 Units Subcutaneous QHS  . LORazepam  0.5 mg Oral Once  . nystatin cream   Topical BID  . sodium bicarbonate  650 mg Oral TID     Assessment/ Plan:    1. Acute renal failure on chronic kidney disease versus progressive chronic kidney disease- renal function better about 2 years ago and has declined significantly since then- She has nephrotic range proteinuria and minimal hematuria -consistent with diabetic nephropathy vs something more ominous (chronic infection, possible secondary amyloid, multiple antibiotic exposures). She continues to have good urine output with slow improvement of her renal function. She status post renal biopsy yesterday for definitive diagnosis as to whether this is something reversible. Stable labs/volume status that could allow discharge within the next 24 hours with close renal followup upon discharge. 2. Anemia- on ESA, iron stores borderline low-repleted. Monitor for post-biopsy drop  3. HTN/vol- high - on hydralazine 50 BID - Now on lasix with diuresis and is improving. Would continue moderate diuresis  4. Chronic left hallux osteomyelitis- primaxin and vanc appreciate ortho performing left hallux amputation-would need wound care center prior to discharge definitive orthopedic followup plan. 5. Metabolic acidosis: Started on oral sodium bicarbonate-would up titrate dosing as indicated.  Elmarie Shiley, MD 02/09/2013, 8:04 AM

## 2013-02-10 DIAGNOSIS — E1142 Type 2 diabetes mellitus with diabetic polyneuropathy: Secondary | ICD-10-CM

## 2013-02-10 DIAGNOSIS — E1149 Type 2 diabetes mellitus with other diabetic neurological complication: Secondary | ICD-10-CM

## 2013-02-10 LAB — RENAL FUNCTION PANEL
BUN: 40 mg/dL — ABNORMAL HIGH (ref 6–23)
CO2: 18 mEq/L — ABNORMAL LOW (ref 19–32)
Chloride: 105 mEq/L (ref 96–112)
Creatinine, Ser: 3.58 mg/dL — ABNORMAL HIGH (ref 0.50–1.10)
Glucose, Bld: 107 mg/dL — ABNORMAL HIGH (ref 70–99)

## 2013-02-10 MED ORDER — OXYCODONE HCL 5 MG PO TABS: 5.0000 mg | ORAL_TABLET | Freq: Four times a day (QID) | ORAL | Status: DC | PRN

## 2013-02-10 MED ORDER — CARVEDILOL 6.25 MG PO TABS: 6.2500 mg | ORAL_TABLET | Freq: Two times a day (BID) | ORAL | Status: DC

## 2013-02-10 MED ORDER — CALCIUM CARBONATE ANTACID 500 MG PO CHEW: 1.0000 | CHEWABLE_TABLET | Freq: Every day | ORAL | Status: DC

## 2013-02-10 MED ORDER — NEPRO/CARBSTEADY PO LIQD: 237.0000 mL | Freq: Two times a day (BID) | ORAL | Status: DC

## 2013-02-10 MED ORDER — CAMPHOR-MENTHOL 0.5-0.5 % EX LOTN
TOPICAL_LOTION | CUTANEOUS | Status: DC | PRN
  Administered 2013-02-10: 09:00:00 via TOPICAL
  Filled 2013-02-10: qty 222

## 2013-02-10 MED ORDER — SODIUM BICARBONATE 650 MG PO TABS: 650.0000 mg | ORAL_TABLET | Freq: Two times a day (BID) | ORAL | Status: DC

## 2013-02-10 MED ORDER — DOXYCYCLINE HYCLATE 100 MG PO TABS: 100.0000 mg | ORAL_TABLET | Freq: Two times a day (BID) | ORAL | Status: DC

## 2013-02-10 MED ORDER — FUROSEMIDE 40 MG PO TABS: 40.0000 mg | ORAL_TABLET | Freq: Two times a day (BID) | ORAL | Status: DC

## 2013-02-10 NOTE — Progress Notes (Signed)
Patient ID: Karen [name], female   DOB: 1976-02-18, 37 y.o.   MRN: [id number]   [facility] Progress Note    Subjective:   Reports to be feeling well-inquires about going home. I do not have a preliminary report of her renal biopsy yet-may possibly get it over the weekend. For discharge later today.    Objective:   BP 147/89  Pulse 91  Temp(Src) 98.6 F (37 C) (Oral)  Resp 22  Ht 5' 6.5" (1.689 m)  Wt 114.216 kg (251 lb 12.8 oz)  BMI 40.04 kg/m2  SpO2 93%  LMP 01/18/2013  Intake/Output Summary (Last 24 hours) at 02/10/13 0913 Last data filed at 02/10/13 0700  Gross per 24 hour  Intake    480 ml  Output    300 ml  Net    180 ml   Weight change: 0.916 kg (2 lb 0.3 oz)  Physical Exam: Gen: Comfortably sitting up in bed CVS: Pulse regular in rate and rhythm, S1 and S2 normal Resp: Clear to auscultation bilaterally-no rales/rhonchi Abd: Soft, obese, nontender and bowel sounds are normal Ext: Trace to 1+ lower extremity edema-left leg in dressing.  Imaging: US Biopsy  02/08/2013   *RADIOLOGY REPORT*  Indication: Nephrotic syndrome, progressive chronic kidney disease  ULTRASOUND GUIDED RENAL BIOPSY  Comparison: Renal ultrasound - 02/02/2013  Medications: Fentanyl 150 mcg IV; Versed 3 mg IV  Total Moderate Sedation time: 23 minutes  Complications: None immediate  Procedure:  Informed written consent was obtained from the patient after a discussion of the risks, benefits and alternatives to treatment. The patient understands and consents to the procedure.  A timeout was performed prior to the initiation of the procedure.  Ultrasound scanning was performed of the bilateral flanks.  The inferior pole of the left kidney was selected for biopsy due to location and sonographic window.  The procedure was planned.  The operative site was prepped and draped in the usual sterile fashion. The overlying soft tissues were anesthetized with 1% lidocaine with epinephrine.  A 17 gauge  core needle biopsy device was advanced into the inferior cortex of the left kidney and 3 core biopsies were obtained under direct ultrasound guidance.  Real time pathologic review confirmed adequate tissue acquisition.  Images were saved for documentation purposes.  The biopsy device was removed and hemostasis was obtained with manual compression.  Post procedural scanning was negative for significant post procedural hemorrhage or additional complication.  A dressing was placed.  The patient tolerated the procedure well without immediate post procedural complication.  Impression:  Technically successful ultrasound guided left renal biopsy.   Original Report Authenticated By: Jake Seats, MD    Labs: BMET  Recent Labs Lab 02/05/13 0327 02/06/13 0400 02/06/13 0615 02/07/13 0535 02/08/13 0500 02/09/13 0536 02/10/13 0545  NA 136 135 135 136 136 133* 134*  K 4.3 SPECIMEN CONTAMINATED, UNABLE TO PERFORM TEST(S). 4.4 4.7 4.1 4.2 4.1  CL 107 SPECIMEN CONTAMINATED, UNABLE TO PERFORM TEST(S). 106 107 105 103 105  CO2 19 SPECIMEN CONTAMINATED, UNABLE TO PERFORM TEST(S). 16* 17* 16* 16* 18*  GLUCOSE 88 SPECIMEN CONTAMINATED, UNABLE TO PERFORM TEST(S). 132* 142* 76 131* 107*  BUN 40* SPECIMEN CONTAMINATED, UNABLE TO PERFORM TEST(S). 40* 40* 37* 40* 40*  CREATININE 4.85* SPECIMEN CONTAMINATED, UNABLE TO PERFORM TEST(S). 4.29* 4.15* 3.95* 3.79* 3.58*  CALCIUM 8.7 SPECIMEN CONTAMINATED, UNABLE TO PERFORM TEST(S). 8.7 8.8 8.8 8.6 8.6  PHOS 5.6* SPECIMEN CONTAMINATED, UNABLE TO PERFORM TEST(S). 5.0* 5.7* 5.2* 4.9* 4.8*  CBC  Recent Labs Lab 02/08/13 0500 02/09/13 0536  WBC 11.7* 11.7*  HGB 7.3* 7.2*  HCT 22.7* 22.7*  MCV 81.7 81.9  PLT 327 292    Medications:    . calcium carbonate  1 tablet Oral Q breakfast  . carvedilol  6.25 mg Oral BID WC  . darbepoetin (ARANESP) injection - NON-DIALYSIS  100 mcg Subcutaneous Q Fri-1800  . doxycycline  100 mg Oral Q12H  . feeding supplement (NEPRO  CARB STEADY)  237 mL Oral BID BM  . furosemide  40 mg Oral BID  . heparin  5,000 Units Subcutaneous Q8H  . hydrALAZINE  50 mg Oral Q8H  . insulin aspart  0-15 Units Subcutaneous TID WC  . insulin aspart  0-5 Units Subcutaneous QHS  . insulin glargine  15 Units Subcutaneous QHS  . LORazepam  0.5 mg Oral Once  . nystatin cream   Topical BID  . sodium bicarbonate  650 mg Oral TID     Assessment/ Plan:   1. Acute renal failure on chronic kidney disease versus progressive chronic kidney disease- underwent renal biopsy 2 days ago for nephrotic range proteinuria/hematuria and unexplained rapid decline of her renal function. Renal function has continued to show slow and gradual improvement and urine output remains fair. May possibly be able to discharge today to follow up with me as an outpatient for further management based on her renal biopsy results. 2. Anemia- on ESA, iron stores borderline low-repleted. We'll reassess need for ESA therapy as an outpatient. 3. HTN/vol- high - on hydralazine 50 BID - Now on lasix with diuresis and is improving. Lasix 40 mg by mouth twice a day appears to be suitable as a discharge dose. 4. 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  5. Metabolic acidosis: Discharge her on oral sodium bicarbonate therapy-I will follow up labs in one week and decide on further adjustment.  Elmarie Shiley, MD 02/10/2013, 9:13 AM

## 2013-02-10 NOTE — Discharge Summary (Signed)
Physician Discharge Summary  Karen [name] [id number] DOB: [birth year] DOA: 02/02/2013  PCP: [name], MD  Admit date: 02/02/2013 Discharge date: 02/10/2013  Time spent: > 30 minutes  Recommendations for Outpatient Follow-up:  BMET to follow electrolytes and renal function CBC to follow Hgb trend Reassess blood pressure and and Jos medication regimen as needed Patient will follow with cardiology service, orthopedic service and renal service as instructed. Please make sure that she has been compliant with those appointments for further evaluation and treatment after discharge.  Discharge Diagnoses:  Principal Problem:   Acute renal failure Active Problems:   Osteomyelitis of left great toe - S/P amputation 02/06/13   DM (diabetes mellitus)  Type II   Diabetic neuropathy   Acute combined systolic and diastolic congestive heart failure   Essential hypertension   PAD (peripheral artery disease)   Cardiomyopathy, ischemic - EF 45-50% with inf WMA by 2D 02/05/13   Discharge Condition: Stable and improved. No chest pain or shortness of breath. Will be discharged home. Followup with orthopedic service, renal service, cardiology and primary care physician as instructed.  Diet recommendation: Low carbohydrates, low-sodium/heart healthy.  Filed Weights   02/08/13 [id number] 02/09/13 0622 02/10/13 0453  Weight: 112.6 kg (248 lb 3.8 oz) 113.3 kg (249 lb 12.5 oz) 114.216 kg (251 lb 12.8 oz)    History of present illness:  37 y.o. female  Who was transferred from [facility] with concerns for acute renal failure necessitating a nephrology workup. Briefly, the patient has a history of poorly controlled as well as a history of "on and off" osteomyelitis of the left toe since 2009. In fact she was recently discharged from [facility] for treatment of osteomyelitis of the toe. There none hospital course she was treated with vancomycin and was discharged with Levaquin. During the hospital stay,  the patient was noted to have evidence of acute renal failure with a creatinine of over 3, baseline creatinine of 1-2 per documentation. The patient was subsequently discharged from the hospital admission on 01/30/2013. Shortly afterwards, the patient noted gradually "swelling all over". At this time,the creatinine was found to be 4.1 with a calculated GFR of around 15. The patient showed signs of volume overload and was subsequently given a dose of IV Lasix. She was then transferred to [facility] for further management.   Hospital Course:  1-acute on chronic renal failure with signs of fluid overload: Renal service has been consulted this admission. Medication has been adjusted and patient now on Lasix twice a day. -Renal biopsy done on 5/21 (results are pending and the plan is for the patient to follow with renal service as an outpatient).  -Renal function continue steadily improvement; at discharge creatinine 3.58.  -Good urine output, no uremic symptoms and no indication for hemodialysis at this point. -Will use sodium bicarbonate, elemental calcium and advised to follow low sodium diet. -Followup with renal service and PCP over the next 2 weeks.  2-diabetes mellitus type 2: Hemoglobin A1c 7.2. Continue current regimen. Patient advised to follow a low carbohydrate diet and to see Dr. Dr. for further medication adjustments as needed.   3-ischemic cardiomyopathy with systolic heart failure: Ejection fraction 35-40%, at this point given the degree of renal failure has been recommended by cardiologist, just Medical therapy. -patient denies any chest pain or shortness of breath.  -Will continue treatment with hydralazine, Lasix and coreg.  -No ACE inhibitors or ARB due to renal failure.   4-anemia: Do to anemia of chronic disease  and iron deficiency (most likely associated with menstrual cycles). Patient received Aranesp and also IV iron infusion during this admission. At discharge  hemoglobin was stable patient asymptomatic. -Patient will follow with renal service to decide further aranesp infusion therapy as an outpatient.  5-left hallux osteomyelitis: Status post amputation. Will continue doxycycline for 5 more days. No signs of sepsis. Follow up with Dr. Doran Durand (orthopedic service) as an outpatient.   6-hypertension: Continue hydralazine, Lasix and coreg. Low sodium diet also recommended. SBP in the 120's to 130's   7-hypoalbuminemia: In the setting of chronic kidney disease. Will continue Nepro  *Rest of medical problems remains stable and the plan is to continue current medication regimen to follow with primary care physician for medication adjustments needed.   Procedures:  Renal biopsy (results pending at discharge; will be follow by renal service)  Left hallux amputation (5/19)  See below for x-ray reports.  Consultations:  Cardiology  Renal service  Orthopedic service  Discharge Exam: Filed Vitals:   02/09/13 1315 02/09/13 2025 02/10/13 0453 02/10/13 0748  BP: 123/84 134/81 125/81 147/89  Pulse: 93 99 98 91  Temp: 97.8 F (36.6 C) 98.4 F (36.9 C) 98.6 F (37 C)   TempSrc: Oral Oral Oral   Resp: 20 21 22    Height:      Weight:   114.216 kg (251 lb 12.8 oz)   SpO2: 92% 91% 93%    Gen: Comfortably sitting up in bed; no acute distress CVS: Pulse regular rate and rhythm, S1 and S2 normal, no rubs Resp: Clear to auscultation bilaterally-no rales/rhonchi and no wheezing Abd: Soft, obese, nontender and bowel sounds are normal  Ext: Trace to 1+ lower extremity edema-left leg in dressing. Neuro: non focal motor or sensory deficit    Discharge Instructions  Discharge Orders   Future Appointments Provider Department Dept Phone   02/23/2013 10:00 AM Tarri Fuller, PA-C [place] [phone]   Future Orders Complete By Expires     Diet - low sodium heart healthy  As directed     Discharge instructions   As directed     Comments:      Follow a heart healthy low sodium diet Take medications as prescribed Follow with PCP in 2 weeks Follow with orthopedic service, cardiology and renal service as instructed.        Medication List    STOP taking these medications       levofloxacin 750 MG tablet  Commonly known as:  LEVAQUIN      TAKE these medications       calcium carbonate 500 MG chewable tablet  Commonly known as:  TUMS - dosed in mg elemental calcium  Chew 1 tablet (200 mg of elemental calcium total) by mouth daily with breakfast.     carvedilol 6.25 MG tablet  Commonly known as:  COREG  Take 1 tablet (6.25 mg total) by mouth 2 (two) times daily with a meal.     doxycycline 100 MG tablet  Commonly known as:  VIBRA-TABS  Take 1 tablet (100 mg total) by mouth every 12 (twelve) hours.     feeding supplement (NEPRO CARB STEADY) Liqd  Take 237 mLs by mouth 2 (two) times daily between meals.     furosemide 40 MG tablet  Commonly known as:  LASIX  Take 1 tablet (40 mg total) by mouth 2 (two) times daily.     hydrALAZINE 50 MG tablet  Commonly known as:  APRESOLINE  Take 50 mg by mouth 2 (two) times daily.     insulin glargine 100 UNIT/ML injection  Commonly known as:  LANTUS  Inject 20 Units into the skin at bedtime.     sodium bicarbonate 650 MG tablet  Take 1 tablet (650 mg total) by mouth 2 (two) times daily.       Allergies  Allergen Reactions  . Morphine And Related Rash  . Penicillins Rash       Follow-up Information   Follow up with HAGER, BRYAN, PA-C. (office will call you)    Contact information:   [address] [place] [address] [phone]       Follow up with Wylene Simmer, MD. Schedule an appointment as soon as possible for a visit in 2 weeks.   Contact information:   [address], [address] [place] [address] [phone]       Follow up with Ulla Potash., MD On 02/21/2013. (AT 1:30PM--- YOU WILL BE SET UP FOR  LABS PRIOR TO THIS AND GET A REMINDER IN THE MAIL)    Contact information:   [place] [address] [phone]       Follow up with [name], MD. Schedule an appointment as soon as possible for a visit in 2 weeks.   Contact information:   [place] [place] [address] [phone]        The results of significant diagnostics from this hospitalization (including imaging, microbiology, ancillary and laboratory) are listed below for reference.    Significant Diagnostic Studies: Dg Chest 2 View  02/06/2013   *RADIOLOGY REPORT*  Clinical Data: Shortness of breath, CHF  CHEST - 2 VIEW  Comparison: Chest x-ray of 02/02/2013  Findings: Although aeration has improved, the lungs remain poorly aerated.  Cardiomegaly is present with pulmonary vascular congestion and possible effusions.  A right PICC line is present with its tip seen to the expected SVC - RA junction.  No pneumothorax is noted.  IMPRESSION: 1.  Slightly improved aeration although there is poor aeration overall.  Persistent cardiomegaly and moderate pulmonary vascular congestion. 2.  Right upper extremity PICC line tip near expected SVC - RA junction.   Original Report Authenticated By: Ivar Drape, M.D.   US Renal  02/02/2013   *RADIOLOGY REPORT*  Clinical Data: Renal failure  RENAL/URINARY TRACT ULTRASOUND COMPLETE  Comparison:  None.  Findings:  Right Kidney:  12.8 cm. No hydronephrosis or renal mass.  Left Kidney:  14.0 cm. No hydronephrosis or renal mass.  Bladder:  Not fully distended.  No gross abnormality.  IMPRESSION: No hydronephrosis.   Original Report Authenticated By: Genia Del, M.D.   Mr Foot Left Wo Contrast  02/03/2013   *RADIOLOGY REPORT*  Clinical Data: Infection of the great toe.  MRI OF THE LEFT FOREFOOT WITHOUT CONTRAST  Technique:  Multiplanar, multisequence MR imaging was performed. No intravenous contrast was administered.  Comparison: Plain films the  great toe this same date.  Findings: There is intense marrow edema throughout the great toe. The head of the proximal phalanx is fragmented and there is destructive change of the medial distal phalanx.  A large skin ulceration is seen on the medial side of the distal phalanx.  There is a small IP joint effusion with fluid tracking along the lateral aspect of the distal phalanx.  Bone marrow signal in the metatarsal head is normal and there is no first MTP joint effusion.  Intrinsic musculature of the  foot demonstrates age advanced atrophy.  No mass is seen.  IMPRESSION: Findings consistent with osteomyelitis throughout the great toe without extension into the first MTP joint or first metatarsal. Skin ulceration with fluid tracking about the distal phalanx compatible with abscess and septic IP joint of the great toe again noted.   Original Report Authenticated By: Orlean Patten, M.D.   US Biopsy  02/08/2013   *RADIOLOGY REPORT*  Indication: Nephrotic syndrome, progressive chronic kidney disease  ULTRASOUND GUIDED RENAL BIOPSY  Comparison: Renal ultrasound - 02/02/2013  Medications: Fentanyl 150 mcg IV; Versed 3 mg IV  Total Moderate Sedation time: 23 minutes  Complications: None immediate  Procedure:  Informed written consent was obtained from the patient after a discussion of the risks, benefits and alternatives to treatment. The patient understands and consents to the procedure.  A timeout was performed prior to the initiation of the procedure.  Ultrasound scanning was performed of the bilateral flanks.  The inferior pole of the left kidney was selected for biopsy due to location and sonographic window.  The procedure was planned.  The operative site was prepped and draped in the usual sterile fashion. The overlying soft tissues were anesthetized with 1% lidocaine with epinephrine.  A 17 gauge core needle biopsy device was advanced into the inferior cortex of the left kidney and 3 core biopsies were obtained  under direct ultrasound guidance.  Real time pathologic review confirmed adequate tissue acquisition.  Images were saved for documentation purposes.  The biopsy device was removed and hemostasis was obtained with manual compression.  Post procedural scanning was negative for significant post procedural hemorrhage or additional complication.  A dressing was placed.  The patient tolerated the procedure well without immediate post procedural complication.  Impression:  Technically successful ultrasound guided left renal biopsy.   Original Report Authenticated By: Jake Seats, MD   Dg Foot Complete Left  02/03/2013   *RADIOLOGY REPORT*  Clinical Data: Diabetic with nonhealing osteomyelitis of the great toe.  LEFT FOOT - COMPLETE 3+ VIEW  Comparison: 01/25/2013.  Findings: Further osteolysis and destruction of the left first proximal phalanx distal aspect consistent with progressive osteomyelitis. Osteomyelitis also involving the left first distal phalanx with adjacent cellulitis/ soft tissue ulceration.  IMPRESSION: Progressive osteolysis/osteomyelitis left first toe as noted above.   Original Report Authenticated By: Genia Del, M.D.   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  LEFT GREAT TOE  Comparison: 02/03/2013, 01/25/2013 and 12/24/2011  Findings: There is progressive bone destruction of the distal aspect of the proximal phalangeal bone of the great toe with a pathologic fracture.  The patient has had previous resection of a portion of the distal phalangeal bone.  No other change.  IMPRESSION: Progressive osteomyelitis with destruction of pathologic fracture of the distal aspect of the proximal phalangeal bone. The bone appears rarefied toward the base.  I suspect osteomyelitis involves the entire proximal phalangeal bone.   Original Report Authenticated By: Lorriane Shire, M.D.    Microbiology: Recent Results (from the past 240  hour(s))  MRSA PCR SCREENING     Status: None   Collection Time    02/02/13  8:24 PM      Result Value Range Status   MRSA by PCR NEGATIVE  NEGATIVE Final   Comment:            The GeneXpert MRSA Assay (FDA     approved for NASAL specimens  only), is one component of a     comprehensive MRSA colonization     surveillance program. It is not     intended to diagnose MRSA     infection nor to guide or     monitor treatment for     MRSA infections.  SURGICAL PCR SCREEN     Status: None   Collection Time    02/05/13 10:21 PM      Result Value Range Status   MRSA, PCR NEGATIVE  NEGATIVE Final   Staphylococcus aureus NEGATIVE  NEGATIVE Final   Comment:            The Xpert SA Assay (FDA     approved for NASAL specimens     in patients over 21 years of age),     is one component of     a comprehensive surveillance     program.  Test performance has     been validated by [employer] for patients greater     than or equal to 88 year old.     It is not intended     to diagnose infection nor to     guide or monitor treatment.  ANAEROBIC CULTURE     Status: None   Collection Time    02/06/13  1:34 PM      Result Value Range Status   Specimen Description TISSUE FOOT LEFT   Final   Special Requests NONE DEEP TISSUE LEFT FOOT   Final   Gram Stain     Final   Value: NO WBC SEEN     NO SQUAMOUS EPITHELIAL CELLS SEEN     NO ORGANISMS SEEN   Culture     Final   Value: NO ANAEROBES ISOLATED; CULTURE IN PROGRESS FOR 5 DAYS   Report Status PENDING   Incomplete  TISSUE CULTURE     Status: None   Collection Time    02/06/13  1:34 PM      Result Value Range Status   Specimen Description TISSUE FOOT LEFT   Final   Special Requests NONE DEEP TISSUE LEFT FOOT   Final   Gram Stain     Final   Value: NO WBC SEEN     NO SQUAMOUS EPITHELIAL CELLS SEEN     NO ORGANISMS SEEN   Culture NO GROWTH 3 DAYS   Final   Report Status 02/09/2013 FINAL   Final     Labs: Basic Metabolic  Panel:  Recent Labs Lab 02/06/13 0615 02/07/13 0535 02/08/13 0500 02/09/13 0536 02/10/13 0545  NA 135 136 136 133* 134*  K 4.4 4.7 4.1 4.2 4.1  CL 106 107 105 103 105  CO2 16* 17* 16* 16* 18*  GLUCOSE 132* 142* 76 131* 107*  BUN 40* 40* 37* 40* 40*  CREATININE 4.29* 4.15* 3.95* 3.79* 3.58*  CALCIUM 8.7 8.8 8.8 8.6 8.6  PHOS 5.0* 5.7* 5.2* 4.9* 4.8*   Liver Function Tests:  Recent Labs Lab 02/06/13 0615 02/07/13 0535 02/08/13 0500 02/09/13 0536 02/10/13 0545  ALBUMIN 2.0* 2.0* 2.0* 1.9* 2.1*   CBC:  Recent Labs Lab 02/08/13 0500 02/09/13 0536  WBC 11.7* 11.7*  HGB 7.3* 7.2*  HCT 22.7* 22.7*  MCV 81.7 81.9  PLT 327 292   Cardiac Enzymes:  Recent Labs Lab 02/03/13 1750 02/03/13 1959  TROPONINI <0.30 <0.30   BNP: BNP (last 3 results)  Recent Labs  02/07/13 0535  PROBNP 16430.0*   CBG:  Recent Labs Lab 02/09/13 0657 02/09/13 1126 02/09/13 1652 02/09/13 2135 02/10/13 0650  GLUCAP 124* 155* 122* 116* 113*     Signed:  Tobey Lippard  [facility] 02/10/2013, 10:22 AM

## 2013-02-10 NOTE — Progress Notes (Signed)
Pt being dc to home, pt given dc instructions, medications reviewed, follow up appointments given, pt verbalized understanding, pt leaving via wheelchair, pt stable

## 2013-02-23 ENCOUNTER — Ambulatory Visit: Payer: Medicaid Other | Admitting: Physician Assistant

## 2013-05-17 ENCOUNTER — Encounter (HOSPITAL_BASED_OUTPATIENT_CLINIC_OR_DEPARTMENT_OTHER): Payer: Medicaid Other

## 2013-05-29 ENCOUNTER — Encounter (HOSPITAL_COMMUNITY): Payer: Self-pay | Admitting: Emergency Medicine

## 2013-05-29 ENCOUNTER — Emergency Department (HOSPITAL_COMMUNITY)
Admission: EM | Admit: 2013-05-29 | Discharge: 2013-05-29 | Disposition: A | Discharge status: Home / Self Care | Payer: Medicaid Other | Attending: Emergency Medicine | Admitting: Emergency Medicine

## 2013-05-29 DIAGNOSIS — Z87891 Personal history of nicotine dependence: Secondary | ICD-10-CM | POA: Insufficient documentation

## 2013-05-29 DIAGNOSIS — Z880 Allergy status to penicillin: Secondary | ICD-10-CM | POA: Insufficient documentation

## 2013-05-29 DIAGNOSIS — I129 Hypertensive chronic kidney disease with stage 1 through stage 4 chronic kidney disease, or unspecified chronic kidney disease: Secondary | ICD-10-CM | POA: Insufficient documentation

## 2013-05-29 DIAGNOSIS — Z8739 Personal history of other diseases of the musculoskeletal system and connective tissue: Secondary | ICD-10-CM | POA: Insufficient documentation

## 2013-05-29 DIAGNOSIS — Z862 Personal history of diseases of the blood and blood-forming organs and certain disorders involving the immune mechanism: Secondary | ICD-10-CM | POA: Insufficient documentation

## 2013-05-29 DIAGNOSIS — Z79899 Other long term (current) drug therapy: Secondary | ICD-10-CM | POA: Insufficient documentation

## 2013-05-29 DIAGNOSIS — E119 Type 2 diabetes mellitus without complications: Secondary | ICD-10-CM | POA: Insufficient documentation

## 2013-05-29 DIAGNOSIS — M7989 Other specified soft tissue disorders: Secondary | ICD-10-CM | POA: Insufficient documentation

## 2013-05-29 DIAGNOSIS — Z8719 Personal history of other diseases of the digestive system: Secondary | ICD-10-CM | POA: Insufficient documentation

## 2013-05-29 DIAGNOSIS — N189 Chronic kidney disease, unspecified: Secondary | ICD-10-CM | POA: Insufficient documentation

## 2013-05-29 DIAGNOSIS — M79609 Pain in unspecified limb: Secondary | ICD-10-CM

## 2013-05-29 DIAGNOSIS — S98139A Complete traumatic amputation of one unspecified lesser toe, initial encounter: Secondary | ICD-10-CM | POA: Insufficient documentation

## 2013-05-29 LAB — BASIC METABOLIC PANEL
BUN: 18 mg/dL (ref 6–23)
Calcium: 8.5 mg/dL (ref 8.4–10.5)
GFR calc non Af Amer: 30 mL/min — ABNORMAL LOW (ref >90)
Glucose, Bld: 103 mg/dL — ABNORMAL HIGH (ref 70–99)
Potassium: 4.1 mEq/L (ref 3.5–5.1)

## 2013-05-29 LAB — CBC WITH DIFFERENTIAL/PLATELET
Eosinophils Absolute: 0.4 10*3/uL (ref 0.0–0.7)
Eosinophils Relative: 4 % (ref 0–5)
Hemoglobin: 8.6 g/dL — ABNORMAL LOW (ref 12.0–15.0)
Lymphs Abs: 1.8 10*3/uL (ref 0.7–4.0)
MCH: 25 pg — ABNORMAL LOW (ref 26.0–34.0)
MCV: 80.2 fL (ref 78.0–100.0)
Monocytes Absolute: 0.9 10*3/uL (ref 0.1–1.0)
Monocytes Relative: 9 % (ref 3–12)
RBC: 3.44 MIL/uL — ABNORMAL LOW (ref 3.87–5.11)

## 2013-05-29 MED ORDER — FENTANYL CITRATE 0.05 MG/ML IJ SOLN
50.0000 ug | Freq: Once | INTRAMUSCULAR | Status: AC
  Administered 2013-05-29: 50 ug via INTRAVENOUS
  Filled 2013-05-29: qty 2

## 2013-05-29 MED ORDER — ONDANSETRON 4 MG PO TBDP
4.0000 mg | ORAL_TABLET | Freq: Once | ORAL | Status: AC
  Administered 2013-05-29: 4 mg via ORAL
  Filled 2013-05-29: qty 1

## 2013-05-29 MED ORDER — CLINDAMYCIN HCL 300 MG PO CAPS: 300.0000 mg | ORAL_CAPSULE | Freq: Four times a day (QID) | ORAL | Status: DC

## 2013-05-29 MED ORDER — CLINDAMYCIN PHOSPHATE 600 MG/50ML IV SOLN
600.0000 mg | Freq: Once | INTRAVENOUS | Status: AC
  Administered 2013-05-29: 600 mg via INTRAVENOUS
  Filled 2013-05-29: qty 50

## 2013-05-29 MED ORDER — OXYCODONE-ACETAMINOPHEN 5-325 MG PO TABS
1.0000 | ORAL_TABLET | Freq: Once | ORAL | Status: AC
  Administered 2013-05-29: 1 via ORAL
  Filled 2013-05-29: qty 1

## 2013-05-29 NOTE — ED Provider Notes (Signed)
TIME SEEN: 11:09 AM   CHIEF COMPLAINT: Left lower extremity swelling and pain  HPI: Patient is a 37 y.o. female with a history of CKD, hypertension, diabetes, osteomyelitis of the great toe of her left foot status post amputation in May 2014 and amputation of her left fourth toe on 05/18/2013 who presents emergency department for evaluation for 2 days of left lower extremity swelling and mild diffuse throbbing pain. Pain is worse with palpation and ambulation. Swelling improves with keeping her leg elevated. Patient reports that she was seen by her surgeon, Dr. Doran Durand, in the office today and he sent her to the emergency department to rule out DVT. Patient reports that she feels that her leg is more swollen because she's been walking on it more. She denies any fever, drainage of pus from the wound, numbness or tingling. No prior history of PE or DVT.  ROS: See HPI Constitutional: no fever  Eyes: no drainage  ENT: no runny nose   Cardiovascular:  no chest pain  Resp: no SOB  GI: no vomiting GU: no dysuria Integumentary: no rash  Allergy: no hives  Musculoskeletal: Left leg swelling  Neurological: no slurred speech ROS otherwise negative  PAST MEDICAL HISTORY/PAST SURGICAL HISTORY:  Past Medical History  Diagnosis Date  . Hypertension   . Anemia   . History of blood transfusion     "last week" (02/02/2013)  . GERD (gastroesophageal reflux disease)   . Osteomyelitis of toe of left foot     "off and on since 2009; no OR" (02/02/2013)  . Renal failure     acute vs chronic/notes 02/02/2013  . Type II diabetes mellitus 1995  . Exertional shortness of breath     "recently; it's fluid" (02/02/2013)    MEDICATIONS:  Prior to Admission medications   Medication Sig Start Date End Date Taking? Authorizing Provider  calcium carbonate (TUMS - DOSED IN MG ELEMENTAL CALCIUM) 500 MG chewable tablet Chew 1 tablet (200 mg of elemental calcium total) by mouth daily with breakfast. 02/10/13   Barton Dubois, MD  carvedilol (COREG) 6.25 MG tablet Take 1 tablet (6.25 mg total) by mouth 2 (two) times daily with a meal. 02/10/13   Barton Dubois, MD  doxycycline (VIBRA-TABS) 100 MG tablet Take 1 tablet (100 mg total) by mouth every 12 (twelve) hours. 02/10/13   Barton Dubois, MD  furosemide (LASIX) 40 MG tablet Take 1 tablet (40 mg total) by mouth 2 (two) times daily. 02/10/13   Barton Dubois, MD  hydrALAZINE (APRESOLINE) 50 MG tablet Take 50 mg by mouth 2 (two) times daily.    Historical Provider, MD  insulin glargine (LANTUS) 100 UNIT/ML injection Inject 20 Units into the skin at bedtime.     Historical Provider, MD  Nutritional Supplements (FEEDING SUPPLEMENT, NEPRO CARB STEADY,) LIQD Take 237 mLs by mouth 2 (two) times daily between meals. 02/10/13   Barton Dubois, MD  oxyCODONE (OXY IR/ROXICODONE) 5 MG immediate release tablet Take 1 tablet (5 mg total) by mouth every 6 (six) hours as needed. 02/10/13   Barton Dubois, MD  sodium bicarbonate 650 MG tablet Take 1 tablet (650 mg total) by mouth 2 (two) times daily. 02/10/13   Barton Dubois, MD    ALLERGIES:  Allergies  Allergen Reactions  . Morphine And Related Rash  . Penicillins Rash    SOCIAL HISTORY:  History  Substance Use Topics  . Smoking status: Former Smoker -- 3.00 packs/day for 10 years    Types: Cigarettes  Quit date: 09/21/2004  . Smokeless tobacco: Never Used  . Alcohol Use: No    FAMILY HISTORY: No family history on file.  EXAM: There were no vitals taken for this visit. CONSTITUTIONAL: Alert and oriented and responds appropriately to questions. Well-appearing; well-nourished HEAD: Normocephalic EYES: Conjunctivae clear, PERRL ENT: normal nose; no rhinorrhea; moist mucous membranes; pharynx without lesions noted NECK: Supple, no meningismus, no LAD  CARD: RRR; S1 and S2 appreciated; no murmurs, no clicks, no rubs, no gallops RESP: Normal chest excursion without splinting or tachypnea; breath sounds clear and equal  bilaterally; no wheezes, no rhonchi, no rales,  ABD/GI: Normal bowel sounds; non-distended; soft, non-tender, no rebound, no guarding BACK:  The back appears normal and is non-tender to palpation, there is no CVA tenderness EXT: Patient has a left great toe amputation with a well-healed surgical scar, left fourth toe AP to get with a surgical incision with sutures in place with no purulent drainage, erythema, warmth, induration, patient does have 2+ pitting edema in her left foot to her upper calf of the left leg, 2+ DP pulses bilaterally, sensation to light touch intact diffusely, otherwise Normal ROM in all joints; non-tender to palpation; no edema of the right lower extremity, normal capillary refill; no cyanosis    SKIN: Normal color for age and race; warm NEURO: Moves all extremities equally PSYCH: The patient's mood and manner are appropriate. Grooming and personal hygiene are appropriate.  MEDICAL DECISION MAKING: Patient with left lower extremity swelling seen here to rule out DVT. We'll give oral pain medication and obtain venous Doppler of her left lower extremity. Symptoms may be do to being more active over the past several days and due to dependent edema.  No signs or symptoms of wound infection or cellulitis.  ED PROGRESS: Patient's ultrasound shows no DVT. Patient now reports that her orthopedist was concerned for possible cellulitis although I do not find any erythema, warmth or induration on exam. Will discuss with her orthopedic physician on call.  3:41PM Spoke with Dr. Georgena Spurling, orthopedics.  He reports pt had clear drainage of fluid from wound today and he was concerned for possible cellulitis. Patient denies any fevers or chills. No erythema or warmth on my exam. There is no purulent drainage from her surgical incision. Given she is well-appearing, her orthopedist is okay with discharge home. Will give a dose of IV clindamycin nail and discharged on oral clindamycin. Will have close  followup with her orthopedist. Basic labs pending.  3:46 PM  Pt WBC is 10.6.  She is not tachycardic or hypotensive.  Pt updated with plan.  Given return precautions.  Will dc on clindamycin. Given instructions to keep her leg elevated at all times.  [place], DO 05/29/13 1621

## 2013-05-29 NOTE — Progress Notes (Signed)
*  PRELIMINARY RESULTS* Vascular Ultrasound Left lower extremity venous duplex has been completed.  Preliminary findings: negative for DVT and baker's cyst.    Landry Mellow, RDMS, RVT  05/29/2013, 1:19 PM

## 2013-05-29 NOTE — ED Notes (Signed)
Diet Tray ordered for  Pt.

## 2013-05-29 NOTE — ED Notes (Signed)
From [place] with a possible LLE DVT.  She had an amputation of some toes on her left foot on 08/28.

## 2013-06-20 ENCOUNTER — Encounter (HOSPITAL_COMMUNITY): Payer: Self-pay | Admitting: General Practice

## 2013-06-20 ENCOUNTER — Inpatient Hospital Stay (HOSPITAL_COMMUNITY)
Admission: AD | Admit: 2013-06-20 | Discharge: 2013-06-30 | DRG: 239 | Disposition: A | Discharge status: Rehabilitation Facility | Payer: Medicaid Other | Source: Other Acute Inpatient Hospital | Attending: Internal Medicine | Admitting: Internal Medicine

## 2013-06-20 DIAGNOSIS — E1165 Type 2 diabetes mellitus with hyperglycemia: Secondary | ICD-10-CM | POA: Diagnosis present

## 2013-06-20 DIAGNOSIS — J209 Acute bronchitis, unspecified: Secondary | ICD-10-CM | POA: Diagnosis present

## 2013-06-20 DIAGNOSIS — I2589 Other forms of chronic ischemic heart disease: Secondary | ICD-10-CM | POA: Diagnosis present

## 2013-06-20 DIAGNOSIS — E1129 Type 2 diabetes mellitus with other diabetic kidney complication: Secondary | ICD-10-CM | POA: Diagnosis present

## 2013-06-20 DIAGNOSIS — N179 Acute kidney failure, unspecified: Secondary | ICD-10-CM

## 2013-06-20 DIAGNOSIS — E119 Type 2 diabetes mellitus without complications: Secondary | ICD-10-CM

## 2013-06-20 DIAGNOSIS — M869 Osteomyelitis, unspecified: Secondary | ICD-10-CM

## 2013-06-20 DIAGNOSIS — Z89519 Acquired absence of unspecified leg below knee: Secondary | ICD-10-CM

## 2013-06-20 DIAGNOSIS — L02619 Cutaneous abscess of unspecified foot: Secondary | ICD-10-CM | POA: Diagnosis present

## 2013-06-20 DIAGNOSIS — N183 Chronic kidney disease, stage 3 unspecified: Secondary | ICD-10-CM | POA: Diagnosis present

## 2013-06-20 DIAGNOSIS — Z794 Long term (current) use of insulin: Secondary | ICD-10-CM

## 2013-06-20 DIAGNOSIS — J9600 Acute respiratory failure, unspecified whether with hypoxia or hypercapnia: Secondary | ICD-10-CM

## 2013-06-20 DIAGNOSIS — K219 Gastro-esophageal reflux disease without esophagitis: Secondary | ICD-10-CM | POA: Diagnosis present

## 2013-06-20 DIAGNOSIS — D649 Anemia, unspecified: Secondary | ICD-10-CM | POA: Diagnosis present

## 2013-06-20 DIAGNOSIS — S88119A Complete traumatic amputation at level between knee and ankle, unspecified lower leg, initial encounter: Secondary | ICD-10-CM

## 2013-06-20 DIAGNOSIS — R5381 Other malaise: Secondary | ICD-10-CM | POA: Diagnosis present

## 2013-06-20 DIAGNOSIS — I10 Essential (primary) hypertension: Secondary | ICD-10-CM | POA: Diagnosis present

## 2013-06-20 DIAGNOSIS — M9080 Osteopathy in diseases classified elsewhere, unspecified site: Secondary | ICD-10-CM | POA: Diagnosis present

## 2013-06-20 DIAGNOSIS — I5041 Acute combined systolic (congestive) and diastolic (congestive) heart failure: Principal | ICD-10-CM | POA: Diagnosis present

## 2013-06-20 DIAGNOSIS — IMO0002 Reserved for concepts with insufficient information to code with codable children: Secondary | ICD-10-CM | POA: Diagnosis present

## 2013-06-20 DIAGNOSIS — E8809 Other disorders of plasma-protein metabolism, not elsewhere classified: Secondary | ICD-10-CM | POA: Diagnosis present

## 2013-06-20 DIAGNOSIS — E46 Unspecified protein-calorie malnutrition: Secondary | ICD-10-CM | POA: Diagnosis not present

## 2013-06-20 DIAGNOSIS — Z87891 Personal history of nicotine dependence: Secondary | ICD-10-CM

## 2013-06-20 DIAGNOSIS — I255 Ischemic cardiomyopathy: Secondary | ICD-10-CM | POA: Diagnosis present

## 2013-06-20 DIAGNOSIS — I129 Hypertensive chronic kidney disease with stage 1 through stage 4 chronic kidney disease, or unspecified chronic kidney disease: Secondary | ICD-10-CM | POA: Diagnosis present

## 2013-06-20 DIAGNOSIS — Z89512 Acquired absence of left leg below knee: Secondary | ICD-10-CM

## 2013-06-20 DIAGNOSIS — I509 Heart failure, unspecified: Secondary | ICD-10-CM | POA: Diagnosis present

## 2013-06-20 DIAGNOSIS — E1140 Type 2 diabetes mellitus with diabetic neuropathy, unspecified: Secondary | ICD-10-CM

## 2013-06-20 LAB — GLUCOSE, CAPILLARY

## 2013-06-20 MED ORDER — SODIUM CHLORIDE 0.9 % IV SOLN: 250.0000 mL | INTRAVENOUS | Status: DC | PRN

## 2013-06-20 MED ORDER — ACETAMINOPHEN 325 MG PO TABS
650.0000 mg | ORAL_TABLET | ORAL | Status: DC | PRN
  Administered 2013-06-20 – 2013-06-24 (×3): 650 mg via ORAL
  Filled 2013-06-20 (×3): qty 2

## 2013-06-20 MED ORDER — FUROSEMIDE 10 MG/ML IJ SOLN
40.0000 mg | Freq: Four times a day (QID) | INTRAMUSCULAR | Status: DC
  Administered 2013-06-20: 40 mg via INTRAVENOUS
  Filled 2013-06-20 (×6): qty 4

## 2013-06-20 MED ORDER — PANTOPRAZOLE SODIUM 40 MG PO TBEC
80.0000 mg | DELAYED_RELEASE_TABLET | Freq: Every day | ORAL | Status: DC
  Administered 2013-06-21: 80 mg via ORAL
  Filled 2013-06-20: qty 2

## 2013-06-20 MED ORDER — ONDANSETRON HCL 4 MG/2ML IJ SOLN
4.0000 mg | Freq: Four times a day (QID) | INTRAMUSCULAR | Status: DC | PRN
  Administered 2013-06-21 – 2013-06-23 (×3): 4 mg via INTRAVENOUS
  Filled 2013-06-20 (×4): qty 2

## 2013-06-20 MED ORDER — INSULIN ASPART 100 UNIT/ML ~~LOC~~ SOLN: 0.0000 [IU] | Freq: Three times a day (TID) | SUBCUTANEOUS | Status: DC

## 2013-06-20 MED ORDER — HEPARIN SODIUM (PORCINE) 5000 UNIT/ML IJ SOLN
5000.0000 [IU] | Freq: Three times a day (TID) | INTRAMUSCULAR | Status: DC
  Administered 2013-06-25 – 2013-06-29 (×7): 5000 [IU] via SUBCUTANEOUS
  Filled 2013-06-20 (×32): qty 1

## 2013-06-20 MED ORDER — SODIUM CHLORIDE 0.9 % IJ SOLN: 3.0000 mL | INTRAMUSCULAR | Status: DC | PRN

## 2013-06-20 MED ORDER — CARVEDILOL 6.25 MG PO TABS
6.2500 mg | ORAL_TABLET | Freq: Two times a day (BID) | ORAL | Status: DC
  Administered 2013-06-21 – 2013-06-22 (×3): 6.25 mg via ORAL
  Filled 2013-06-20 (×5): qty 1

## 2013-06-20 MED ORDER — GUAIFENESIN-DM 100-10 MG/5ML PO SYRP
5.0000 mL | ORAL_SOLUTION | ORAL | Status: DC | PRN
  Administered 2013-06-20 – 2013-06-21 (×2): 5 mL via ORAL
  Filled 2013-06-20 (×2): qty 5

## 2013-06-20 MED ORDER — SODIUM CHLORIDE 0.9 % IJ SOLN
3.0000 mL | Freq: Two times a day (BID) | INTRAMUSCULAR | Status: DC
  Administered 2013-06-20 – 2013-06-22 (×3): 3 mL via INTRAVENOUS

## 2013-06-20 MED ORDER — INSULIN GLARGINE 100 UNIT/ML ~~LOC~~ SOLN
9.0000 [IU] | Freq: Every day | SUBCUTANEOUS | Status: DC
  Administered 2013-06-22 – 2013-06-24 (×2): 9 [IU] via SUBCUTANEOUS
  Filled 2013-06-20 (×5): qty 0.09

## 2013-06-20 NOTE — H&P (Signed)
[facility] History and Physical  Karen [name] [id number] DOB: Feb 13, 1976 DOA: 06/20/2013  Referring physician: ED PCP: [name], MD   Chief Complaint: SOB  HPI: Karen [name] is a 37 y.o. female who presents to the ED at [facility] with c/o SOB.  Symptoms have been going on for 2-3 days now, but she has had some SOB and cough for several months.  Cough is productive of a clear frothy sputum.  Associated with pedal edema increasing over the last few months.  Associated with orthopnea.  Lying down makes it worse, sitting up makes it better.  In ED she was found to be in CHF, given lasix and transferred to [facility].  Review of Systems: No fever no chills no chest pain, 12 systems reviewed and otherwise negative.  Past Medical History  Diagnosis Date  . Hypertension   . Anemia   . History of blood transfusion     "last week" (02/02/2013)  . GERD (gastroesophageal reflux disease)   . Osteomyelitis of toe of left foot     "off and on since 2009; no OR" (02/02/2013)  . Renal failure     acute vs chronic/notes 02/02/2013  . Type II diabetes mellitus 1995  . Exertional shortness of breath     "recently; it's fluid" (02/02/2013)   Past Surgical History  Procedure Laterality Date  . Cesarean section  10/18/2006  . Amputation Left 02/06/2013    Procedure: AMPUTATION LEFT GREAT TOE;  Surgeon: Wylene Simmer, MD;  Location: [place];  Service: Orthopedics;  Laterality: Left;   Social History:  reports that she quit smoking about 8 years ago. Her smoking use included Cigarettes. She has a 30 pack-year smoking history. She has never used smokeless tobacco. She reports that she does not drink alcohol or use illicit drugs.   Allergies  Allergen Reactions  . Morphine And Related Rash  . Penicillins Rash    No family history on file.  Prior to Admission medications   Medication Sig Start Date End Date Taking? Authorizing Provider  calcium carbonate (TUMS - DOSED IN MG ELEMENTAL CALCIUM)  500 MG chewable tablet Chew 1 tablet (200 mg of elemental calcium total) by mouth daily with breakfast. 02/10/13   Barton Dubois, MD  carvedilol (COREG) 6.25 MG tablet Take 1 tablet (6.25 mg total) by mouth 2 (two) times daily with a meal. 02/10/13   Barton Dubois, MD  clindamycin (CLEOCIN) 300 MG capsule Take 1 capsule (300 mg total) by mouth every 6 (six) hours. 05/29/13   Kristen N Weathersby, DO  esomeprazole (NEXIUM) 40 MG capsule Take 40 mg by mouth daily before breakfast.    Historical Provider, MD  hydrALAZINE (APRESOLINE) 50 MG tablet Take 50 mg by mouth 2 (two) times daily.    Historical Provider, MD  insulin glargine (LANTUS) 100 UNIT/ML injection Inject 18 Units into the skin at bedtime.     Historical Provider, MD  Nutritional Supplements (FEEDING SUPPLEMENT, NEPRO CARB STEADY,) LIQD Take 237 mLs by mouth 2 (two) times daily between meals. 02/10/13   Barton Dubois, MD   Physical Exam: Filed Vitals:   06/20/13 1900  BP: 142/94  Pulse: 98  Temp: 98 F (36.7 C)  Resp: 20    General:  NAD, resting comfortably in bed Eyes: PEERLA EOMI ENT: mucous membranes moist Neck: supple w/o JVD Cardiovascular: RRR w/o MRG Respiratory: CTA B Abdomen: soft, nt, nd, bs+ Skin: no rash nor lesion Musculoskeletal: MAE, full ROM all 4 extremities Psychiatric: normal tone and affect  Neurologic: AAOx3, grossly non-focal  Labs on Admission:  Basic Metabolic Panel: No results found for this basename: NA, K, CL, CO2, GLUCOSE, BUN, CREATININE, CALCIUM, MG, PHOS,  in the last 168 hours Liver Function Tests: No results found for this basename: AST, ALT, ALKPHOS, BILITOT, PROT, ALBUMIN,  in the last 168 hours No results found for this basename: LIPASE, AMYLASE,  in the last 168 hours No results found for this basename: AMMONIA,  in the last 168 hours CBC: No results found for this basename: WBC, NEUTROABS, HGB, HCT, MCV, PLT,  in the last 168 hours Cardiac Enzymes: No results found for this basename:  CKTOTAL, CKMB, CKMBINDEX, TROPONINI,  in the last 168 hours  BNP (last 3 results)  Recent Labs  02/07/13 0535  PROBNP 16430.0*   CBG: No results found for this basename: GLUCAP,  in the last 168 hours  Radiological Exams on Admission: No results found.  EKG: Independently reviewed.  Assessment/Plan Principal Problem:   Acute combined systolic and diastolic congestive heart failure Active Problems:   DM (diabetes mellitus)  Type II   Essential hypertension   Cardiomyopathy, ischemic - EF 45-50% with inf WMA by 2D 02/05/13   CKD (chronic kidney disease) stage 3, GFR 30-59 ml/min   1. Acute CHF - on CHF pathway, lasix ordered, beta blocker ordered, not putting on ACEi secondary to CKD stage 3. 2. CKD stage 3 - appears chronic and patients baseline 3. DM2 - putting patient on 9 units of lantus QHS (half home does of 18), CBG checks AC/HS and low dose SSI 4. HTN - lasix and beta blocker for now    Code Status: Full Code (must indicate code status--if unknown or must be presumed, indicate so) Family Communication: No family in room (indicate person spoken with, if applicable, with phone number if by telephone) Disposition Plan: Admit to obs (indicate anticipated LOS)  Time spent: 70 min  [name], Karen M. [facility] Pager [phone]  If 7PM-7AM, please contact night-coverage [email or web address] Password [email or web address] 06/20/2013, 8:52 PM

## 2013-06-21 ENCOUNTER — Inpatient Hospital Stay (HOSPITAL_COMMUNITY): Payer: Medicaid Other

## 2013-06-21 ENCOUNTER — Encounter (HOSPITAL_COMMUNITY): Payer: Self-pay | Admitting: *Deleted

## 2013-06-21 LAB — CBC WITH DIFFERENTIAL/PLATELET
Eosinophils Absolute: 0.2 10*3/uL (ref 0.0–0.7)
Eosinophils Relative: 1 % (ref 0–5)
Lymphocytes Relative: 13 % (ref 12–46)
Lymphs Abs: 2.2 10*3/uL (ref 0.7–4.0)
MCH: 23.7 pg — ABNORMAL LOW (ref 26.0–34.0)
MCV: 75.3 fL — ABNORMAL LOW (ref 78.0–100.0)
Monocytes Absolute: 1.5 10*3/uL — ABNORMAL HIGH (ref 0.1–1.0)
Platelets: 376 10*3/uL (ref 150–400)
RBC: 2.91 MIL/uL — ABNORMAL LOW (ref 3.87–5.11)

## 2013-06-21 LAB — BASIC METABOLIC PANEL
BUN: 18 mg/dL (ref 6–23)
CO2: 17 mEq/L — ABNORMAL LOW (ref 19–32)
Calcium: 8.1 mg/dL — ABNORMAL LOW (ref 8.4–10.5)
Creatinine, Ser: 2.1 mg/dL — ABNORMAL HIGH (ref 0.50–1.10)
GFR calc non Af Amer: 29 mL/min — ABNORMAL LOW (ref >90)
Glucose, Bld: 69 mg/dL — ABNORMAL LOW (ref 70–99)
Sodium: 132 mEq/L — ABNORMAL LOW (ref 135–145)

## 2013-06-21 LAB — ABO/RH: ABO/RH(D): O POS

## 2013-06-21 LAB — TROPONIN I
Troponin I: <0.3 ng/mL (ref <0.30)
Troponin I: <0.3 ng/mL (ref <0.30)

## 2013-06-21 LAB — GLUCOSE, CAPILLARY
Glucose-Capillary: 76 mg/dL (ref 70–99)
Glucose-Capillary: 107 mg/dL — ABNORMAL HIGH (ref 70–99)
Glucose-Capillary: 89 mg/dL (ref 70–99)

## 2013-06-21 MED ORDER — ISOSORB DINITRATE-HYDRALAZINE 20-37.5 MG PO TABS
1.0000 | ORAL_TABLET | Freq: Two times a day (BID) | ORAL | Status: DC
  Administered 2013-06-21 – 2013-06-22 (×3): 1 via ORAL
  Filled 2013-06-21 (×4): qty 1

## 2013-06-21 MED ORDER — OXYCODONE HCL 5 MG PO TABS
5.0000 mg | ORAL_TABLET | Freq: Four times a day (QID) | ORAL | Status: DC | PRN
  Administered 2013-06-21 – 2013-06-24 (×5): 5 mg via ORAL
  Filled 2013-06-21 (×5): qty 1

## 2013-06-21 MED ORDER — SODIUM CHLORIDE 0.9 % IJ SOLN
10.0000 mL | INTRAMUSCULAR | Status: DC | PRN
  Administered 2013-06-22 – 2013-06-23 (×3): 10 mL

## 2013-06-21 MED ORDER — GLUCERNA SHAKE PO LIQD
237.0000 mL | Freq: Two times a day (BID) | ORAL | Status: DC
  Administered 2013-06-22 – 2013-06-27 (×10): 237 mL via ORAL

## 2013-06-21 MED ORDER — DEXTROSE 5 % IV SOLN
1.0000 g | INTRAVENOUS | Status: DC
  Administered 2013-06-21: 12:00:00 1 g via INTRAVENOUS
  Filled 2013-06-21 (×2): qty 10

## 2013-06-21 MED ORDER — FUROSEMIDE 10 MG/ML IJ SOLN
80.0000 mg | Freq: Three times a day (TID) | INTRAMUSCULAR | Status: DC
  Administered 2013-06-21 (×3): 80 mg via INTRAVENOUS
  Filled 2013-06-21 (×4): qty 8

## 2013-06-21 MED ORDER — PROMETHAZINE HCL 25 MG PO TABS: 25.0000 mg | ORAL_TABLET | Freq: Four times a day (QID) | ORAL | Status: DC | PRN

## 2013-06-21 MED ORDER — PROMETHAZINE HCL 25 MG/ML IJ SOLN
25.0000 mg | Freq: Four times a day (QID) | INTRAMUSCULAR | Status: DC | PRN
  Administered 2013-06-21: 25 mg via INTRAVENOUS
  Filled 2013-06-21 (×2): qty 1

## 2013-06-21 MED ORDER — PROMETHAZINE HCL 25 MG RE SUPP
25.0000 mg | Freq: Four times a day (QID) | RECTAL | Status: DC | PRN
  Filled 2013-06-21: qty 1

## 2013-06-21 MED ORDER — DEXTROSE 5 % IV SOLN
500.0000 mg | INTRAVENOUS | Status: DC
  Administered 2013-06-21: 12:00:00 500 mg via INTRAVENOUS
  Filled 2013-06-21 (×2): qty 500

## 2013-06-21 MED ORDER — CALCIUM CARBONATE ANTACID 500 MG PO CHEW
1.0000 | CHEWABLE_TABLET | ORAL | Status: DC | PRN
  Filled 2013-06-21: qty 1

## 2013-06-21 MED ORDER — OXYCODONE-ACETAMINOPHEN 10-325 MG PO TABS: 1.0000 | ORAL_TABLET | Freq: Four times a day (QID) | ORAL | Status: DC | PRN

## 2013-06-21 MED ORDER — OXYCODONE-ACETAMINOPHEN 5-325 MG PO TABS
1.0000 | ORAL_TABLET | Freq: Four times a day (QID) | ORAL | Status: DC | PRN
  Administered 2013-06-21 – 2013-06-24 (×4): 1 via ORAL
  Filled 2013-06-21 (×4): qty 1

## 2013-06-21 NOTE — Progress Notes (Signed)
Peripherally Inserted Central Catheter/Midline Placement  The IV Nurse has discussed with the patient and/or persons authorized to consent for the patient, the purpose of this procedure and the potential benefits and risks involved with this procedure.  The benefits include less needle sticks, lab draws from the catheter and patient may be discharged home with the catheter.  Risks include, but not limited to, infection, bleeding, blood clot (thrombus formation), and puncture of an artery; nerve damage and irregular heat beat.  Alternatives to this procedure were also discussed.  PICC/Midline Placement Documentation        Jule Economy Horton 06/21/2013, 10:41 AM

## 2013-06-21 NOTE — Progress Notes (Signed)
[facility] PROGRESS NOTE Interim History: 37 y.o. female who presents to the ED at [facility] with c/o SOB. Symptoms have been going on for 2-3 days now, but she has had some SOB and cough for several months. Cough is productive of a clear frothy sputum. Associated with pedal edema increasing over the last few months. Associated with orthopnea   Intake/Output Summary (Last 24 hours) at 06/21/13 0944 Last data filed at 06/21/13 0837  Gross per 24 hour  Intake    243 ml  Output   1325 ml  Net  -1082 ml       Filed Weights   06/20/13 1900 06/21/13 0625  Weight: 100.789 kg (222 lb 3.2 oz) 99.565 kg (219 lb 8 oz)        Recent Labs  02/07/13 0535  PROBNP 16430.0*     Assessment/Plan: Acute combined systolic and diastolic congestive heart failure/ Cardiomyopathy, ischemic - EF 45-50% with inf WMA by 2D 02/05/13/ Leukocytosis: - On lasix IV, increase to 80 mg, negative about 1.3 L. + JVD mild crackles on right. Start Bidil. - CBC 17 with a left shift, ? If infectious process check a CT chest with out contrast Non productive cough. - Insert midline start Rocephin and azithromycin. - Electrolytes stable. - cardiac marker negative. - restrict fluid and sodium intake.  CKD (chronic kidney disease) stage 3, GFR 30-59 ml/min due to DM -  Cr improved (3.5 : 5.23.2014), now 2.0. - cont IV lasix. 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  Essential hypertension - elevated along with tachycardia. - Increase lasix, start bidil.  Type II or unspecified type diabetes mellitus with unspecified complication, uncontrolled - cont lantus plus SSI.   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 - consulted WOC.  Code Status: full Family Communication: none  Disposition Plan: inpatinet   Consultants:  none  Procedures: ECHO: 10.01.2014  Antibiotics:  Rocephin and Azithro  10.01.2014  HPI/Subjective: Complaining of cough  Objective: Filed Vitals:   06/20/13 1900 06/20/13 2126 06/21/13 0110 06/21/13 0625  BP: 142/94 142/95 139/89 146/94  Pulse: 98 109 95 107  Temp: 98 F (36.7 C) 98.1 F (36.7 C) 98.6 F (37 C) 99.5 F (37.5 C)  TempSrc: Oral Oral Oral Oral  Resp: 20 22 20 18   Height: 5\' 6"  (1.676 m)     Weight: 100.789 kg (222 lb 3.2 oz)   99.565 kg (219 lb 8 oz)  SpO2: 100% 100% 98% 100%     Exam:  General: Alert, awake, oriented x3, in no acute distress.  HEENT: No bruits, no goiter. +JVD. Heart: Regular rate and rhythm, without murmurs, rubs, gallops.  Lungs: Good air movement, crackles on the right.  Abdomen: Soft, nontender, nondistended, positive bowel sounds.    Data Reviewed: Basic Metabolic Panel:  Recent Labs Lab 06/21/13 0620  NA 132*  K 4.3  CL 102  CO2 17*  GLUCOSE 69*  BUN 18  CREATININE 2.10*  CALCIUM 8.1*  MG 1.7   Liver Function Tests: No results found for this basename: AST, ALT, ALKPHOS, BILITOT, PROT, ALBUMIN,  in the last 168 hours No results found for this basename: LIPASE, AMYLASE,  in the last 168 hours No results found for this basename: AMMONIA,  in the last 168 hours CBC:  Recent Labs Lab 06/21/13 0620  WBC PENDING  NEUTROABS PENDING  HGB 6.9*  HCT 21.9*  MCV 75.3*  PLT PENDING  Cardiac Enzymes:  Recent Labs Lab 06/21/13 0009 06/21/13 0620  TROPONINI <0.30 <0.30   BNP (last 3 results)  Recent Labs  02/07/13 0535  PROBNP 16430.0*   CBG:  Recent Labs Lab 06/20/13 2115 06/21/13 0704  GLUCAP 84 76    No results found for this or any previous visit (from the past 240 hour(s)).   Studies: No results found.  Scheduled Meds: . carvedilol  6.25 mg Oral BID WC  . furosemide  40 mg Intravenous Q6H  . heparin  5,000 Units Subcutaneous Q8H  . insulin aspart  0-9 Units Subcutaneous TID WC  . insulin glargine  9 Units Subcutaneous QHS  . pantoprazole  80 mg Oral Q1200  .  sodium chloride  3 mL Intravenous Q12H   Continuous Infusions:    Charlynne Cousins  [facility] Pager [phone]. If 8PM-8AM, please contact night-coverage at [email or web address], password [email or web address] 06/21/2013, 9:44 AM  LOS: 1 day

## 2013-06-21 NOTE — Progress Notes (Signed)
The patient has a moderate amount of drainage coming from her left 4th toe, which was amputated recently.  A wound consult was put in.  The patient was given 2L of prn O2 for comfort overnight because her breathing was a little labored while she was in bed at the beginning of the shift.  She is still having dyspnea with exertion this morning, but her breathing is no longer labored when she is in bed so she is not wearing the oxygen.  She complained of a headache and cough overnight.  Both were relieved with medication.  She is refusing her subq heparin injections.

## 2013-06-21 NOTE — Progress Notes (Signed)
INITIAL NUTRITION ASSESSMENT  DOCUMENTATION CODES Per approved criteria  -Obesity Unspecified   INTERVENTION: Provide Glucerna Shakes BID Encourage PO intake  NUTRITION DIAGNOSIS: Inadequate oral intake related to poor appetite/nausea as evidenced by pt's report of eating 1 meal daily for past 9 months and 13% wt loss in less than 5 months.   Goal: Pt to meet >/= 90% of their estimated nutrition needs   Monitor:  PO intake Weight trend Labs  Reason for Assessment: Malnutrition Screening Tool, score of 2  37 y.o. female  Admitting Dx: Acute combined systolic and diastolic congestive heart failure  ASSESSMENT: 37 y.o. female who presents to the ED at [place] [facility] with c/o SOB. Symptoms have been going on for 2-3 days now, but she has had some SOB and cough for several months. In ED she was found to be in CHF, given lasix and transferred to [facility]. Pt has history of hypertension, GERD, renal failure, and type 2 diabetes.  Pt reports that she has been eating poorly for the past 9 months due to nausea and decreased appetite. She states that most days she only eats 1 meal and she has been drinking Nepro Shakes occasionally. Pt reports her usual body weight is 250 lbs and weight loss is partially related to poor food intake and partially caused by fluid loss. Recommended Glucerna Shakes.  Pt states she has never received diet education for low sodium diet and is interested in learning.  RD provided "Heart Failure Nutrition Therapy" handout from the Academy of [employer]etics. Provided examples on ways to decrease sodium intake in diet. Discouraged intake of processed foods and use of salt shaker. Encouraged fresh fruits and vegetables as well as whole grain sources of carbohydrates to maximize fiber intake.  RD discussed why it is important for patient to adhere to diet recommendations, and emphasized the role of fluids, and foods to avoid. Teach back method used.   Height: Ht  Readings from Last 1 Encounters:  06/20/13 5\' 6"  (1.676 m)    Weight: Wt Readings from Last 1 Encounters:  06/21/13 219 lb 8 oz (99.565 kg)    Ideal Body Weight: 130 lbs  % Ideal Body Weight: 168%  Wt Readings from Last 10 Encounters:  06/21/13 219 lb 8 oz (99.565 kg)  02/10/13 251 lb 12.8 oz (114.216 kg)  02/10/13 251 lb 12.8 oz (114.216 kg)  04/23/11 231 lb (104.781 kg)    Usual Body Weight: 250 lbs  % Usual Body Weight: 88%  BMI:  Body mass index is 35.45 kg/(m^2).  Estimated Nutritional Needs: Kcal: 1900-2100 Protein: 80-90 grams Fluid: 1.5 L/day fluid restriction  Skin: non-pitting RLE and LLE edema; amputation of left toe  Diet Order: Cardiac  EDUCATION NEEDS: -Education needs addressed   Intake/Output Summary (Last 24 hours) at 06/21/13 1537 Last data filed at 06/21/13 1300  Gross per 24 hour  Intake    243 ml  Output   1325 ml  Net  -1082 ml    Last BM: 9/29   Labs:   Recent Labs Lab 06/21/13 0620  NA 132*  K 4.3  CL 102  CO2 17*  BUN 18  CREATININE 2.10*  CALCIUM 8.1*  MG 1.7  GLUCOSE 69*    CBG (last 3)   Recent Labs  06/20/13 2115 06/21/13 0704 06/21/13 1120  GLUCAP 84 76 107*    Scheduled Meds: . azithromycin  500 mg Intravenous Q24H  . carvedilol  6.25 mg Oral BID WC  . cefTRIAXone (  ROCEPHIN)  IV  1 g Intravenous Q24H  . furosemide  80 mg Intravenous TID  . heparin  5,000 Units Subcutaneous Q8H  . insulin aspart  0-9 Units Subcutaneous TID WC  . insulin glargine  9 Units Subcutaneous QHS  . isosorbide-hydrALAZINE  1 tablet Oral BID  . pantoprazole  80 mg Oral Q1200  . sodium chloride  3 mL Intravenous Q12H    Continuous Infusions:   Past Medical History  Diagnosis Date  . Hypertension   . Anemia   . History of blood transfusion     "last week" (02/02/2013)  . GERD (gastroesophageal reflux disease)   . Osteomyelitis of toe of left foot     "off and on since 2009; no OR" (02/02/2013)  . Renal failure      acute vs chronic/notes 02/02/2013  . Type II diabetes mellitus 1995  . Exertional shortness of breath     "recently; it's fluid" (02/02/2013)    Past Surgical History  Procedure Laterality Date  . Cesarean section  10/18/2006  . Amputation Left 02/06/2013    Procedure: AMPUTATION LEFT GREAT TOE;  Surgeon: Wylene Simmer, MD;  Location: [place];  Service: Orthopedics;  Laterality: Left;    Pryor Ochoa RD, LDN Inpatient Clinical Dietitian Pager: [phone] After Hours Pager: [phone]

## 2013-06-21 NOTE — Progress Notes (Signed)
Lab reports Hgb of 6.9. Dr Olevia Bowens aware

## 2013-06-21 NOTE — Consult Note (Signed)
WOC consult Note Reason for Consult: evaluation of wound, s/p amputation of the left 4th toe per Dr. Doran Durand.  Pt reports she is followed by Dr. Doran Durand for this open wound she has recently seen Dr. Doran Durand last week.  She reports HHRN packs the wound daily. Today I am unable to appreciate any depth to pack and the periwound is quite macerated. Wound type: non healing surgical wound Measurement:1.0cm x 0.2cm x 0.2cm Wound [id number], I probed but can not find any depth Drainage (amount, consistency, odor) serous drainage on the kerlix I have removed today, non purulent Periwound:maceration  Dressing procedure/placement/frequency:small strip of silicone foam applied to absorb excess exudate, wrap with kerlix, to be changed daily.  Follow up with Dr. Doran Durand at the time of discharge as scheduled.   Discussed POC with patient and bedside nurse.  Re consult if needed, will not follow at this time. Thanks  Shauntelle Jamerson [place], [place] [phone])

## 2013-06-21 NOTE — Progress Notes (Signed)
Utilization Review Completed.   Esma Kilts, RN, BSN Nurse Case Manager  [phone]  

## 2013-06-22 ENCOUNTER — Inpatient Hospital Stay (HOSPITAL_COMMUNITY): Payer: Medicaid Other

## 2013-06-22 DIAGNOSIS — J9600 Acute respiratory failure, unspecified whether with hypoxia or hypercapnia: Secondary | ICD-10-CM

## 2013-06-22 DIAGNOSIS — I059 Rheumatic mitral valve disease, unspecified: Secondary | ICD-10-CM

## 2013-06-22 LAB — BASIC METABOLIC PANEL
BUN: 21 mg/dL (ref 6–23)
Calcium: 7.4 mg/dL — ABNORMAL LOW (ref 8.4–10.5)
GFR calc Af Amer: 28 mL/min — ABNORMAL LOW (ref >90)
GFR calc non Af Amer: 24 mL/min — ABNORMAL LOW (ref >90)
Glucose, Bld: 106 mg/dL — ABNORMAL HIGH (ref 70–99)
Potassium: 4.1 mEq/L (ref 3.5–5.1)
Sodium: 134 mEq/L — ABNORMAL LOW (ref 135–145)

## 2013-06-22 LAB — PRO B NATRIURETIC PEPTIDE: Pro B Natriuretic peptide (BNP): 55261 pg/mL — ABNORMAL HIGH (ref 0–125)

## 2013-06-22 LAB — GLUCOSE, CAPILLARY
Glucose-Capillary: 94 mg/dL (ref 70–99)
Glucose-Capillary: 111 mg/dL — ABNORMAL HIGH (ref 70–99)
Glucose-Capillary: 103 mg/dL — ABNORMAL HIGH (ref 70–99)
Glucose-Capillary: 141 mg/dL — ABNORMAL HIGH (ref 70–99)

## 2013-06-22 MED ORDER — SODIUM CHLORIDE 0.9 % IV BOLUS (SEPSIS)
250.0000 mL | Freq: Once | INTRAVENOUS | Status: AC
  Administered 2013-06-22: 13:00:00 250 mL via INTRAVENOUS

## 2013-06-22 MED ORDER — FUROSEMIDE 40 MG PO TABS
40.0000 mg | ORAL_TABLET | Freq: Two times a day (BID) | ORAL | Status: DC
  Filled 2013-06-22: qty 1

## 2013-06-22 MED ORDER — FUROSEMIDE 40 MG PO TABS
40.0000 mg | ORAL_TABLET | Freq: Two times a day (BID) | ORAL | Status: DC
  Administered 2013-06-22 – 2013-06-23 (×4): 40 mg via ORAL
  Filled 2013-06-22 (×7): qty 1

## 2013-06-22 MED ORDER — FUROSEMIDE 40 MG PO TABS
40.0000 mg | ORAL_TABLET | Freq: Two times a day (BID) | ORAL | Status: DC
  Filled 2013-06-22 (×3): qty 1

## 2013-06-22 MED ORDER — AZITHROMYCIN 500 MG PO TABS
500.0000 mg | ORAL_TABLET | Freq: Every day | ORAL | Status: DC
  Administered 2013-06-22: 10:00:00 500 mg via ORAL
  Filled 2013-06-22 (×2): qty 1

## 2013-06-22 MED ORDER — CARVEDILOL 12.5 MG PO TABS
12.5000 mg | ORAL_TABLET | Freq: Two times a day (BID) | ORAL | Status: DC
  Administered 2013-06-22 – 2013-06-30 (×8): 12.5 mg via ORAL
  Filled 2013-06-22 (×19): qty 1

## 2013-06-22 NOTE — Progress Notes (Signed)
Echocardiogram 2D Echocardiogram has been performed.  Joelene Millin 06/22/2013, 3:52 PM

## 2013-06-22 NOTE — Progress Notes (Signed)
[facility] PROGRESS NOTE Interim History: 37 y.o. female who presents to the ED at [facility] hospital with c/o SOB. Symptoms have been going on for 2-3 days now, but she has had some SOB and cough for several months. Cough is productive of a clear frothy sputum. Associated with pedal edema increasing over the last few months. Associated with orthopnea Filed Weights   06/20/13 1900 06/21/13 0625 06/22/13 0503  Weight: 100.789 kg (222 lb 3.2 oz) 99.565 kg (219 lb 8 oz) 98.6 kg (217 lb 6 oz)   BNP    Component Value Date/Time   PROBNP 55261.0* 06/22/2013 0500    Assessment/Plan: Acute respiratory failure/Acute combined systolic and diastolic congestive heart failure/ F 45-50% with inf WMA by 2D 02/05/13/ Acute bronchitis: - Change lasix to oral. + JVD mild crackles on right. Cont coreg and Bidil. - CBC 17 with a left shift, CT chest with out contrast Non productive cough. - Electrolytes stable. Echo 10.02.2014 pending - CT chest, no infiltrate, purulent cough improved, ? Acute bronchitis. Breathing improved  CKD (chronic kidney disease) stage 3, GFR 30-59 ml/min due to DM -  Cr improved (3.5 : 5.23.2014), mild increase in cr change lasix to orals. 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  Essential hypertension - Improved control on bidil tachycardia resolved - Cont bidil.  Type II or unspecified type diabetes mellitus with unspecified complication, uncontrolled - cont lantus plus SSI.   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 No purulence. - consulted WOC.  Code Status: full Family Communication: none  Disposition Plan: inpatinet   Consultants:  none  Procedures: ECHO: 10.01.2014  Antibiotics:  Rocephin and Azithro 10.01.2014  HPI/Subjective: SOB and cough improved.  Objective: Filed Vitals:   06/20/13 1900 06/20/13 2126 06/21/13 0110 06/21/13 0625  BP: 142/94 142/95  139/89 146/94  Pulse: 98 109 95 107  Temp: 98 F (36.7 C) 98.1 F (36.7 C) 98.6 F (37 C) 99.5 F (37.5 C)  TempSrc: Oral Oral Oral Oral  Resp: 20 22 20 18   Height: 5\' 6"  (1.676 m)     Weight: 100.789 kg (222 lb 3.2 oz)   99.565 kg (219 lb 8 oz)  SpO2: 100% 100% 98% 100%     Exam:  General: Alert, awake, oriented x3, in no acute distress.  HEENT: No bruits, no goiter. No appriciated JVD. Heart: Regular rate and rhythm, without murmurs, rubs, gallops.  Lungs: Good air movement, crackles on the right.  Abdomen: Soft, nontender, nondistended, positive bowel sounds.    Data Reviewed: Basic Metabolic Panel:  Recent Labs Lab 06/21/13 0620  NA 132*  K 4.3  CL 102  CO2 17*  GLUCOSE 69*  BUN 18  CREATININE 2.10*  CALCIUM 8.1*  MG 1.7   Liver Function Tests: No results found for this basename: AST, ALT, ALKPHOS, BILITOT, PROT, ALBUMIN,  in the last 168 hours No results found for this basename: LIPASE, AMYLASE,  in the last 168 hours No results found for this basename: AMMONIA,  in the last 168 hours CBC:  Recent Labs Lab 06/21/13 0620  WBC PENDING  NEUTROABS PENDING  HGB 6.9*  HCT 21.9*  MCV 75.3*  PLT PENDING   Cardiac Enzymes:  Recent Labs Lab 06/21/13 0009 06/21/13 0620  TROPONINI <0.30 <0.30   BNP (last 3 results)  Recent Labs  02/07/13 0535  PROBNP 16430.0*   CBG:  Recent Labs Lab 06/20/13 2115 06/21/13 0704  GLUCAP  84 76    No results found for this or any previous visit (from the past 240 hour(s)).   Studies: No results found.  Scheduled Meds: . carvedilol  6.25 mg Oral BID WC  . furosemide  40 mg Intravenous Q6H  . heparin  5,000 Units Subcutaneous Q8H  . insulin aspart  0-9 Units Subcutaneous TID WC  . insulin glargine  9 Units Subcutaneous QHS  . pantoprazole  80 mg Oral Q1200  . sodium chloride  3 mL Intravenous Q12H   Continuous Infusions:    Charlynne Cousins  [facility] Pager [phone]. If 8PM-8AM, please  contact night-coverage at [email or web address], password [facility] 06/21/2013, 9:44 AM  LOS: 1 day

## 2013-06-22 NOTE — Progress Notes (Signed)
Pt's BP manually is 80/44.  Pt c/o of feeling dizzy.  Notified Dr. Olevia Bowens, orders given for 250 bolus and to discontinue bidil.  Will carry out MD orders and continue to monitor.

## 2013-06-23 ENCOUNTER — Inpatient Hospital Stay (HOSPITAL_COMMUNITY): Payer: Medicaid Other

## 2013-06-23 DIAGNOSIS — E1165 Type 2 diabetes mellitus with hyperglycemia: Secondary | ICD-10-CM

## 2013-06-23 LAB — GLUCOSE, CAPILLARY
Glucose-Capillary: 82 mg/dL (ref 70–99)
Glucose-Capillary: 104 mg/dL — ABNORMAL HIGH (ref 70–99)
Glucose-Capillary: 118 mg/dL — ABNORMAL HIGH (ref 70–99)
Glucose-Capillary: 109 mg/dL — ABNORMAL HIGH (ref 70–99)

## 2013-06-23 LAB — BASIC METABOLIC PANEL
CO2: 17 mEq/L — ABNORMAL LOW (ref 19–32)
Calcium: 6.7 mg/dL — ABNORMAL LOW (ref 8.4–10.5)
Chloride: 105 mEq/L (ref 96–112)
GFR calc Af Amer: 24 mL/min — ABNORMAL LOW (ref >90)
Potassium: 3.6 mEq/L (ref 3.5–5.1)

## 2013-06-23 LAB — CBC
HCT: 18.8 % — ABNORMAL LOW (ref 36.0–46.0)
Hemoglobin: 6.1 g/dL — CL (ref 12.0–15.0)
MCV: 75.2 fL — ABNORMAL LOW (ref 78.0–100.0)
RBC: 2.5 MIL/uL — ABNORMAL LOW (ref 3.87–5.11)
WBC: 18.5 10*3/uL — ABNORMAL HIGH (ref 4.0–10.5)

## 2013-06-23 MED ORDER — SODIUM CHLORIDE 0.9 % IV SOLN
INTRAVENOUS | Status: DC
  Administered 2013-06-23: 17:00:00 20 mL/h via INTRAVENOUS
  Administered 2013-06-24: 1000 mL via INTRAVENOUS

## 2013-06-23 MED ORDER — PIPERACILLIN-TAZOBACTAM IN DEX 2-0.25 GM/50ML IV SOLN: 2.2500 g | Freq: Three times a day (TID) | INTRAVENOUS | Status: DC

## 2013-06-23 MED ORDER — PANTOPRAZOLE SODIUM 40 MG PO TBEC
40.0000 mg | DELAYED_RELEASE_TABLET | Freq: Every day | ORAL | Status: DC
  Administered 2013-06-24 – 2013-06-30 (×7): 40 mg via ORAL
  Filled 2013-06-23 (×5): qty 1

## 2013-06-23 MED ORDER — LEVOFLOXACIN IN D5W 750 MG/150ML IV SOLN
750.0000 mg | INTRAVENOUS | Status: DC
  Administered 2013-06-23: 12:00:00 750 mg via INTRAVENOUS
  Filled 2013-06-23: qty 150

## 2013-06-23 MED ORDER — VANCOMYCIN HCL IN DEXTROSE 1-5 GM/200ML-% IV SOLN
1000.0000 mg | INTRAVENOUS | Status: DC
  Administered 2013-06-23 – 2013-06-24 (×2): 1000 mg via INTRAVENOUS
  Filled 2013-06-23 (×3): qty 200

## 2013-06-23 MED ORDER — DEXTROSE 5 % IV SOLN
1.0000 g | INTRAVENOUS | Status: DC
  Administered 2013-06-23: 16:00:00 1 g via INTRAVENOUS
  Filled 2013-06-23 (×3): qty 1

## 2013-06-23 NOTE — Progress Notes (Addendum)
[facility] PROGRESS NOTE Interim History: 37 y.o. female who presents to the ED at [facility] hospital with c/o SOB. Symptoms have been going on for 2-3 days now, but she has had some SOB and cough for several months. Cough is productive of a clear frothy sputum. Associated with pedal edema increasing over the last few months. 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 With purulence. - Worsening CBC x-ray of left foot, start vanc and zosyn. - consult ortho. Place NPO for possible I and D.  Acute respiratory failure/Acute combined systolic and diastolic congestive heart failure/ F 45-50% with inf WMA by 2D 02/05/13/ Acute bronchitis: - Change lasix to oral. Cont coreg and Bidil. Eu volemic. - Electrolytes stable. Echo 10.02.2014:ejection fraction was in the range of 40% to 45% -  Acute bronchitis. Breathing improved  CKD (chronic kidney disease) stage 3, GFR 30-59 ml/min due to DM -  Cr improved (3.5 : 5.23.2014). 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  Essential hypertension - Improved control on bidil tachycardia resolved - Cont bidil.  Type II or unspecified type diabetes mellitus with unspecified complication, uncontrolled - cont lantus plus SSI.    Code Status: full Family Communication: none  Disposition Plan: inpatinet   Consultants:  none  Procedures: ECHO: 10.01.2014  Antibiotics: Change to vanc and zosyn 10.3.2014 HPI/Subjective: Chills this am  Objective: Filed Vitals:   06/20/13 1900 06/20/13 2126 06/21/13 0110 06/21/13 0625  BP: 142/94 142/95 139/89 146/94  Pulse: 98 109 95 107  Temp: 98 F (36.7 C) 98.1 F  (36.7 C) 98.6 F (37 C) 99.5 F (37.5 C)  TempSrc: Oral Oral Oral Oral  Resp: 20 22 20 18   Height: 5\' 6"  (1.676 m)     Weight: 100.789 kg (222 lb 3.2 oz)   99.565 kg (219 lb 8 oz)  SpO2: 100% 100% 98% 100%     Exam:  General: Alert, awake, oriented x3, in no acute distress.  HEENT: No bruits, no goiter. No appriciated JVD. Heart: Regular rate and rhythm, without murmurs, rubs, gallops.  Lungs: Good air movement, crackles on the right.  Abdomen: Soft, nontender, nondistended, positive bowel sounds.    Data Reviewed: Basic Metabolic Panel:  Recent Labs Lab 06/21/13 0620  NA 132*  K 4.3  CL 102  CO2 17*  GLUCOSE 69*  BUN 18  CREATININE 2.10*  CALCIUM 8.1*  MG 1.7   Liver Function Tests: No results found for this basename: AST, ALT, ALKPHOS, BILITOT, PROT, ALBUMIN,  in the last 168 hours No results found for this basename: LIPASE, AMYLASE,  in the last 168 hours No results found for this basename: AMMONIA,  in the last 168 hours CBC:  Recent Labs Lab 06/21/13 0620  WBC PENDING  NEUTROABS PENDING  HGB 6.9*  HCT 21.9*  MCV 75.3*  PLT PENDING   Cardiac Enzymes:  Recent Labs Lab 06/21/13 0009 06/21/13 0620  TROPONINI <0.30 <0.30   BNP (last 3 results)  Recent Labs  02/07/13 0535  PROBNP 16430.0*   CBG:  Recent Labs Lab 06/20/13 2115 06/21/13 0704  GLUCAP 84 76    No results found for  this or any previous visit (from the past 240 hour(s)).   Studies: No results found.  Scheduled Meds: . carvedilol  6.25 mg Oral BID WC  . furosemide  40 mg Intravenous Q6H  . heparin  5,000 Units Subcutaneous Q8H  . insulin aspart  0-9 Units Subcutaneous TID WC  . insulin glargine  9 Units Subcutaneous QHS  . pantoprazole  80 mg Oral Q1200  . sodium chloride  3 mL Intravenous Q12H   Continuous Infusions:    Charlynne Cousins  [facility] Pager [phone]. If 8PM-8AM, please contact night-coverage at [email or web address], password [facility] 06/21/2013,  9:44 AM  LOS: 1 day

## 2013-06-23 NOTE — Progress Notes (Signed)
ANTIBIOTIC CONSULT NOTE - INITIAL  Pharmacy Consult for Vancomycin Indication: Fever, left foot wound infection  Allergies  Allergen Reactions  . Morphine And Related Rash  . Penicillins Rash    Patient Measurements: Height: 5\' 6"  (167.6 cm) Weight: 220 lb 0.3 oz (99.8 kg) (refused to stand) IBW/kg (Calculated) : 59.3  Vital Signs: Temp: 101.1 F (38.4 C) (10/03 0845) Temp src: Oral (10/03 0845) BP: 123/75 mmHg (10/03 0845) Pulse Rate: 107 (10/03 0845) Intake/Output from previous day: 10/02 0701 - 10/03 0700 In: 440 [P.O.:440] Out: 401 [Urine:400; Stool:1] Intake/Output from this shift: Total I/O In: 480 [P.O.:480] Out: -   Labs:  Recent Labs  06/21/13 0620 06/22/13 0500 06/23/13 0550  WBC 17.2*  --  18.5*  HGB 6.9*  --  6.1*  PLT 376  --  287  CREATININE 2.10* 2.45* 2.78*   Estimated Creatinine Clearance: 33.3 ml/min (by C-G formula based on Cr of 2.78). No results found for this basename: VANCOTROUGH, VANCOPEAK, VANCORANDOM, GENTTROUGH, GENTPEAK, GENTRANDOM, TOBRATROUGH, TOBRAPEAK, TOBRARND, AMIKACINPEAK, AMIKACINTROU, AMIKACIN,  in the last 72 hours   Microbiology: No results found for this or any previous visit (from the past 720 hour(s)).  Medical History: Past Medical History  Diagnosis Date  . Hypertension   . Anemia   . History of blood transfusion     "last week" (02/02/2013)  . GERD (gastroesophageal reflux disease)   . Osteomyelitis of toe of left foot     "off and on since 2009; no OR" (02/02/2013)  . Renal failure     acute vs chronic/notes 02/02/2013  . Type II diabetes mellitus 1995  . Exertional shortness of breath     "recently; it's fluid" (02/02/2013)    Assessment: 75 YOF with hx of type 2 DM, CKD, admitted on 9/30 with COB, found to have CHF (EF 40-45%), pt. Has a left wound s/p amputation of the 4th toe, She is spiking fever today with Tm of 101.1, wbc trending up at 18.5. Originally started azithromycin and rocephin on 10/1, now  changing to cefepime and vancomycin. Pt's scr is trending up 2.78 today, est. crcl ~ 30 ml/min. Blood culture is pending.    Goal of Therapy:  Vancomycin trough level 15-20 mcg/ml  Plan:  - Start vancomycin 1g IV Q 24 hrs - f/u renal function and cultures - vancomycin trough at steady state if indicated.  Maryanna Shape, PharmD, BCPS  Clinical Pharmacist  Pager: [phone]   06/23/2013,12:41 PM

## 2013-06-23 NOTE — Progress Notes (Signed)
PT Cancellation Note  Patient Details Name: Karen [name] MRN: [id number] DOB: 06-17-1976   Cancelled Treatment:    Reason Eval/Treat Not Completed: Medical issues which prohibited therapy (Pt feeling weak and Hgb 6.1.)   Margene Cherian 06/23/2013, 8:55 AM

## 2013-06-23 NOTE — Progress Notes (Signed)
Pt NPO after rec. Lunch tray. Pt was addiment that she was eating lunch. Pt only eat half of grill cheese.

## 2013-06-23 NOTE — Progress Notes (Signed)
Pt refuse Coreg, worried that it will drop BP as on 10-2

## 2013-06-23 NOTE — Plan of Care (Signed)
Problem: Phase I Progression Outcomes Goal: EF % per last Echo/documented,Core Reminder form on chart Outcome: Completed/Met Date Met:  06/23/13 40-45%

## 2013-06-23 NOTE — Progress Notes (Signed)
Pt to MRI for scan of left foot.  Pt's gfr is too low to get IV contrast.  Non contrast images were acquired.

## 2013-06-23 NOTE — Consult Note (Signed)
ROS:  As detailed in the HPI.  Agree with above note.  MRI of foot pending.  Surgical plan will be based on the degree of involvement of the foot, presence or absence of osteo and / or abscess.  We'll follow with the primary team.

## 2013-06-23 NOTE — Consult Note (Signed)
Reason for Consult:Left foot infection Referring Physician: Dr. Allie Bossier is an 37 y.o. female.  HPI: 37 y/o female with previous medical hx of osteomyelitis of left great toe - s/p amputation in 02/06/2013, was admitted to hospital for SOB and productive cough.  Pt reported she has been experiencing fever and pain in left lateral forefoot since being admitted.  Pt has been seeing wound care for dressing changes and packing of ulcer, of which last visit was 6 days ago.  Pt today c/o continued left forefoot pain with drainage of ulcer.  Pain radiates into the dorsum of the midfoot and is dull throbbing in nature.  Pt stated she has been feverish all day today and since she was admitted.  She also stated that she has anemia and has blood products ordered but has not received them because of her fever.  Pt denies nausea, vomiting, SOB, calf pain bilaterally, or change in appetite.  Past Medical History  Diagnosis Date  . Hypertension   . Anemia   . History of blood transfusion     "last week" (02/02/2013)  . GERD (gastroesophageal reflux disease)   . Osteomyelitis of toe of left foot     "off and on since 2009; no OR" (02/02/2013)  . Renal failure     acute vs chronic/notes 02/02/2013  . Type II diabetes mellitus 1995  . Exertional shortness of breath     "recently; it's fluid" (02/02/2013)    Past Surgical History  Procedure Laterality Date  . Cesarean section  10/18/2006  . Amputation Left 02/06/2013    Procedure: AMPUTATION LEFT GREAT TOE;  Surgeon: Wylene Simmer, MD;  Location: [place];  Service: Orthopedics;  Laterality: Left;    History reviewed. No pertinent family history.  Social History:  reports that she quit smoking about 8 years ago. Her smoking use included Cigarettes. She has a 30 pack-year smoking history. She has never used smokeless tobacco. She reports that she does not drink alcohol or use illicit drugs.  Allergies:  Allergies  Allergen Reactions  . Morphine And  Related Rash  . Penicillins Rash    Medications: I have reviewed the patient's current medications.  Results for orders placed during the hospital encounter of 06/20/13 (from the past 48 hour(s))  GLUCOSE, CAPILLARY     Status: None   Collection Time    06/21/13  9:57 PM      Result Value Range   Glucose-Capillary 93  70 - 99 mg/dL  BASIC METABOLIC PANEL     Status: Abnormal   Collection Time    06/22/13  5:00 AM      Result Value Range   Sodium 134 (*) 135 - 145 mEq/L   Potassium 4.1  3.5 - 5.1 mEq/L   Chloride 104  96 - 112 mEq/L   CO2 19  19 - 32 mEq/L   Glucose, Bld 106 (*) 70 - 99 mg/dL   BUN 21  6 - 23 mg/dL   Creatinine, Ser 2.45 (*) 0.50 - 1.10 mg/dL   Calcium 7.4 (*) 8.4 - 10.5 mg/dL   GFR calc non Af Amer 24 (*) >90 mL/min   GFR calc Af Amer 28 (*) >90 mL/min   Comment: (NOTE)     The eGFR has been calculated using the CKD EPI equation.     This calculation has not been validated in all clinical situations.     eGFR's persistently <90 mL/min signify possible Chronic Kidney  Disease.  PRO B NATRIURETIC PEPTIDE     Status: Abnormal   Collection Time    06/22/13  5:00 AM      Result Value Range   Pro B Natriuretic peptide (BNP) 55261.0 (*) 0 - 125 pg/mL  GLUCOSE, CAPILLARY     Status: None   Collection Time    06/22/13  6:25 AM      Result Value Range   Glucose-Capillary 94  70 - 99 mg/dL  GLUCOSE, CAPILLARY     Status: Abnormal   Collection Time    06/22/13 11:45 AM      Result Value Range   Glucose-Capillary 111 (*) 70 - 99 mg/dL   Comment 1 Notify RN    GLUCOSE, CAPILLARY     Status: Abnormal   Collection Time    06/22/13  4:04 PM      Result Value Range   Glucose-Capillary 103 (*) 70 - 99 mg/dL   Comment 1 Notify RN    GLUCOSE, CAPILLARY     Status: Abnormal   Collection Time    06/22/13  9:23 PM      Result Value Range   Glucose-Capillary 141 (*) 70 - 99 mg/dL  BASIC METABOLIC PANEL     Status: Abnormal   Collection Time    06/23/13  5:50 AM       Result Value Range   Sodium 133 (*) 135 - 145 mEq/L   Potassium 3.6  3.5 - 5.1 mEq/L   Chloride 105  96 - 112 mEq/L   CO2 17 (*) 19 - 32 mEq/L   Glucose, Bld 79  70 - 99 mg/dL   BUN 24 (*) 6 - 23 mg/dL   Creatinine, Ser 2.78 (*) 0.50 - 1.10 mg/dL   Calcium 6.7 (*) 8.4 - 10.5 mg/dL   GFR calc non Af Amer 21 (*) >90 mL/min   GFR calc Af Amer 24 (*) >90 mL/min   Comment: (NOTE)     The eGFR has been calculated using the CKD EPI equation.     This calculation has not been validated in all clinical situations.     eGFR's persistently <90 mL/min signify possible Chronic Kidney     Disease.  CBC     Status: Abnormal   Collection Time    06/23/13  5:50 AM      Result Value Range   WBC 18.5 (*) 4.0 - 10.5 K/uL   RBC 2.50 (*) 3.87 - 5.11 MIL/uL   Hemoglobin 6.1 (*) 12.0 - 15.0 g/dL   Comment: CRITICAL VALUE NOTED.  VALUE IS CONSISTENT WITH PREVIOUSLY REPORTED AND CALLED VALUE.     REPEATED TO VERIFY   HCT 18.8 (*) 36.0 - 46.0 %   MCV 75.2 (*) 78.0 - 100.0 fL   MCH 24.4 (*) 26.0 - 34.0 pg   MCHC 32.4  30.0 - 36.0 g/dL   RDW 15.4  11.5 - 15.5 %   Platelets 287  150 - 400 K/uL   Comment: DELTA CHECK NOTED     REPEATED TO VERIFY  GLUCOSE, CAPILLARY     Status: None   Collection Time    06/23/13  6:57 AM      Result Value Range   Glucose-Capillary 82  70 - 99 mg/dL  PREPARE RBC (CROSSMATCH)     Status: None   Collection Time    06/23/13  7:14 AM      Result Value Range   Order Confirmation ORDER PROCESSED BY BLOOD BANK  GLUCOSE, CAPILLARY     Status: Abnormal   Collection Time    06/23/13 10:54 AM      Result Value Range   Glucose-Capillary 104 (*) 70 - 99 mg/dL  GLUCOSE, CAPILLARY     Status: Abnormal   Collection Time    06/23/13  4:04 PM      Result Value Range   Glucose-Capillary 118 (*) 70 - 99 mg/dL    Dg Chest Port 1 View  06/23/2013   CLINICAL DATA:  Fever and chills  EXAM: PORTABLE CHEST - 1 VIEW  COMPARISON:  06/22/2013  FINDINGS: Stable right upper  extremity PICC. Right pleural effusion has increased. Low volumes and bibasilar atelectasis. Cardiomegaly.  IMPRESSION: Stable cardiomegaly and low volumes. Increasing small right pleural effusion.   Electronically Signed   By: Maryclare Bean M.D.   On: 06/23/2013 10:10   Dg Chest Port 1 View  06/22/2013   CLINICAL DATA:  Shortness of breath and cough  EXAM: PORTABLE CHEST - 1 VIEW  COMPARISON:  06/20/2013  FINDINGS: A right-sided PICC line is now seen with catheter tip at the cavoatrial junction. The cardiac shadow is enlarged. The known pleural effusions are less well visualized on this exam due to the some are erect position. No focal infiltrate is seen.  IMPRESSION: PICC line as described.  Known pleural effusions are less well visualized.  Stable cardiomegaly.   Electronically Signed   By: Inez Catalina   On: 06/22/2013 08:06    ROS:   PE:  Blood pressure 114/67, pulse 78, temperature 99 F (37.2 C), temperature source Oral, resp. rate 18, height 5\' 6"  (1.676 m), weight 99.8 kg (220 lb 0.3 oz), last menstrual period 05/22/2013, SpO2 99.00%. WN obese 36 y/o female, in NAD, A/O x 3, pt appears stated age.  EOMI, respirations unlabored, mood and affect are normal. Physical exam: Left foot has 4th amputated toe with deep ulcer formation extending down towards the midfoot 2.5cm, probed with CTA.  Ulcer was productive of purulent discharge as well as small strip of saturated iodoform gauze, which was removed.  Area surrounding ulcer was macerated, edematous, with malodorous smell and positive TTP.  ROM at left ankle was limited with dorsiflexion lacking 5 degrees of neutral and plantar flexion of 25 degrees.  Sensation to light touch was decreased in the forefoot but normal at the midfoot.  Strength 5/5 with plantar/dorsiflexion of ankle joint.  Dorsalis pedis pulse of left foot was difficult to palpate and was diminished.  Ulcer was redressed with roller gauze and 4x4s.  Assessment/Plan: I had detailed  discussion with pt regarding her ulcer formation on the left foot.  We discussed surgical options, including amputation of the foot.  Pt wanted to discuss these options with her family before making final decision.  I have ordered and MRI of her left foot to evaluate for presence of osteomyelitis.  I will review results with Dr. Doran Durand, who will discuss with pt regarding further surgical options.  FLOWERS, CHRISTOPHER S 06/23/2013, 4:53 PM

## 2013-06-23 NOTE — Progress Notes (Addendum)
Pt 04x, pt states being cold. Temp 101.1. Blood held. md aware  culture and xray order orth stand done SBP 110 lying 122 set and 111 standing . Blood returned to blood bank. Will continue to monitor PT

## 2013-06-24 ENCOUNTER — Encounter (HOSPITAL_COMMUNITY): Payer: Self-pay | Admitting: Certified Registered"

## 2013-06-24 ENCOUNTER — Encounter (HOSPITAL_COMMUNITY)
Admission: AD | Disposition: A | Discharge status: Rehabilitation Facility | Payer: Self-pay | Source: Other Acute Inpatient Hospital | Attending: Internal Medicine

## 2013-06-24 ENCOUNTER — Inpatient Hospital Stay (HOSPITAL_COMMUNITY): Payer: Medicaid Other | Admitting: Anesthesiology

## 2013-06-24 ENCOUNTER — Encounter (HOSPITAL_COMMUNITY): Payer: Self-pay | Admitting: Anesthesiology

## 2013-06-24 DIAGNOSIS — M869 Osteomyelitis, unspecified: Secondary | ICD-10-CM

## 2013-06-24 HISTORY — PX: AMPUTATION: SHX166

## 2013-06-24 LAB — CBC WITH DIFFERENTIAL/PLATELET
Basophils Absolute: 0 10*3/uL (ref 0.0–0.1)
Eosinophils Relative: 0 % (ref 0–5)
HCT: 24.4 % — ABNORMAL LOW (ref 36.0–46.0)
Hemoglobin: 7.8 g/dL — ABNORMAL LOW (ref 12.0–15.0)
Lymphocytes Relative: 5 % — ABNORMAL LOW (ref 12–46)
Lymphs Abs: 1.1 10*3/uL (ref 0.7–4.0)
Monocytes Absolute: 1.5 10*3/uL — ABNORMAL HIGH (ref 0.1–1.0)
Monocytes Relative: 7 % (ref 3–12)
Neutro Abs: 19.7 10*3/uL — ABNORMAL HIGH (ref 1.7–7.7)
Platelets: 282 10*3/uL (ref 150–400)
RDW: 16.1 % — ABNORMAL HIGH (ref 11.5–15.5)
WBC: 22.3 10*3/uL — ABNORMAL HIGH (ref 4.0–10.5)

## 2013-06-24 LAB — GLUCOSE, CAPILLARY
Glucose-Capillary: 126 mg/dL — ABNORMAL HIGH (ref 70–99)
Glucose-Capillary: 90 mg/dL (ref 70–99)
Glucose-Capillary: 75 mg/dL (ref 70–99)
Glucose-Capillary: 67 mg/dL — ABNORMAL LOW (ref 70–99)

## 2013-06-24 LAB — BASIC METABOLIC PANEL
Calcium: 7.2 mg/dL — ABNORMAL LOW (ref 8.4–10.5)
Chloride: 99 mEq/L (ref 96–112)
Creatinine, Ser: 3.06 mg/dL — ABNORMAL HIGH (ref 0.50–1.10)
GFR calc Af Amer: 21 mL/min — ABNORMAL LOW (ref >90)
Potassium: 4.1 mEq/L (ref 3.5–5.1)

## 2013-06-24 LAB — SURGICAL PCR SCREEN: Staphylococcus aureus: NEGATIVE

## 2013-06-24 SURGERY — AMPUTATION BELOW KNEE
Anesthesia: General | Site: Leg Lower | Laterality: Left | Wound class: Clean

## 2013-06-24 MED ORDER — BACITRACIN ZINC 500 UNIT/GM EX OINT
TOPICAL_OINTMENT | CUTANEOUS | Status: AC
  Filled 2013-06-24: qty 15

## 2013-06-24 MED ORDER — INSULIN ASPART 100 UNIT/ML ~~LOC~~ SOLN
0.0000 [IU] | SUBCUTANEOUS | Status: DC
  Administered 2013-06-24: 1 [IU] via SUBCUTANEOUS

## 2013-06-24 MED ORDER — PROPOFOL 10 MG/ML IV BOLUS
INTRAVENOUS | Status: DC | PRN
  Administered 2013-06-24: 140 mg via INTRAVENOUS

## 2013-06-24 MED ORDER — GLYCOPYRROLATE 0.2 MG/ML IJ SOLN
INTRAMUSCULAR | Status: DC | PRN
  Administered 2013-06-24: 0.6 mg via INTRAVENOUS

## 2013-06-24 MED ORDER — SODIUM CHLORIDE 0.9 % IV BOLUS (SEPSIS)
250.0000 mL | Freq: Once | INTRAVENOUS | Status: AC
  Administered 2013-06-24: 250 mL via INTRAVENOUS

## 2013-06-24 MED ORDER — VECURONIUM BROMIDE 10 MG IV SOLR
INTRAVENOUS | Status: DC | PRN
  Administered 2013-06-24: 2 mg via INTRAVENOUS
  Administered 2013-06-24: 3 mg via INTRAVENOUS

## 2013-06-24 MED ORDER — PROMETHAZINE HCL 25 MG/ML IJ SOLN: 6.2500 mg | INTRAMUSCULAR | Status: DC | PRN

## 2013-06-24 MED ORDER — METOCLOPRAMIDE HCL 5 MG PO TABS
5.0000 mg | ORAL_TABLET | Freq: Three times a day (TID) | ORAL | Status: DC | PRN
  Filled 2013-06-24: qty 2

## 2013-06-24 MED ORDER — SODIUM CHLORIDE 0.9 % IV SOLN
INTRAVENOUS | Status: DC
  Administered 2013-06-24: 12:00:00 via INTRAVENOUS

## 2013-06-24 MED ORDER — HYDROMORPHONE HCL PF 1 MG/ML IJ SOLN
1.0000 mg | INTRAMUSCULAR | Status: DC | PRN
  Administered 2013-06-24 – 2013-06-29 (×8): 1 mg via INTRAVENOUS
  Filled 2013-06-24 (×8): qty 1

## 2013-06-24 MED ORDER — SODIUM CHLORIDE 0.9 % IJ SOLN
INTRAMUSCULAR | Status: DC | PRN
  Administered 2013-06-24: 16:00:00

## 2013-06-24 MED ORDER — MIDAZOLAM HCL 5 MG/5ML IJ SOLN
INTRAMUSCULAR | Status: DC | PRN
  Administered 2013-06-24: 2 mg via INTRAVENOUS

## 2013-06-24 MED ORDER — ARTIFICIAL TEARS OP OINT
TOPICAL_OINTMENT | OPHTHALMIC | Status: DC | PRN
  Administered 2013-06-24: 1 via OPHTHALMIC

## 2013-06-24 MED ORDER — DEXTROSE IN LACTATED RINGERS 5 % IV SOLN
INTRAVENOUS | Status: DC
  Administered 2013-06-24: 1000 mL via INTRAVENOUS

## 2013-06-24 MED ORDER — SODIUM CHLORIDE 0.9 % IV SOLN
INTRAVENOUS | Status: DC
  Administered 2013-06-24: 22:00:00 via INTRAVENOUS
  Administered 2013-06-25: 11:00:00 1000 mL via INTRAVENOUS

## 2013-06-24 MED ORDER — 0.9 % SODIUM CHLORIDE (POUR BTL) OPTIME
TOPICAL | Status: DC | PRN
  Administered 2013-06-24: 1000 mL

## 2013-06-24 MED ORDER — OXYCODONE HCL 5 MG/5ML PO SOLN: 5.0000 mg | Freq: Once | ORAL | Status: DC | PRN

## 2013-06-24 MED ORDER — BACITRACIN ZINC 500 UNIT/GM EX OINT
TOPICAL_OINTMENT | CUTANEOUS | Status: DC | PRN
  Administered 2013-06-24: 1 via TOPICAL

## 2013-06-24 MED ORDER — DOCUSATE SODIUM 100 MG PO CAPS
100.0000 mg | ORAL_CAPSULE | Freq: Two times a day (BID) | ORAL | Status: DC
  Administered 2013-06-24 – 2013-06-28 (×7): 100 mg via ORAL
  Filled 2013-06-24 (×13): qty 1

## 2013-06-24 MED ORDER — SUCCINYLCHOLINE CHLORIDE 20 MG/ML IJ SOLN
INTRAMUSCULAR | Status: DC | PRN
  Administered 2013-06-24: 90 mg via INTRAVENOUS

## 2013-06-24 MED ORDER — HYDROMORPHONE HCL PF 1 MG/ML IJ SOLN
0.2500 mg | INTRAMUSCULAR | Status: DC | PRN
  Administered 2013-06-24 (×2): 0.5 mg via INTRAVENOUS

## 2013-06-24 MED ORDER — NEOSTIGMINE METHYLSULFATE 1 MG/ML IJ SOLN
INTRAMUSCULAR | Status: DC | PRN
  Administered 2013-06-24: 5 mg via INTRAVENOUS

## 2013-06-24 MED ORDER — METOCLOPRAMIDE HCL 5 MG/ML IJ SOLN
5.0000 mg | Freq: Three times a day (TID) | INTRAMUSCULAR | Status: DC | PRN
  Filled 2013-06-24: qty 2

## 2013-06-24 MED ORDER — FENTANYL CITRATE 0.05 MG/ML IJ SOLN
INTRAMUSCULAR | Status: DC | PRN
  Administered 2013-06-24 (×3): 50 ug via INTRAVENOUS

## 2013-06-24 MED ORDER — BUPIVACAINE LIPOSOME 1.3 % IJ SUSP
20.0000 mL | Freq: Once | INTRAMUSCULAR | Status: DC
  Filled 2013-06-24: qty 20

## 2013-06-24 MED ORDER — HYDROMORPHONE HCL PF 1 MG/ML IJ SOLN
INTRAMUSCULAR | Status: AC
  Administered 2013-06-24: 0.5 mg via INTRAVENOUS
  Filled 2013-06-24: qty 1

## 2013-06-24 MED ORDER — ONDANSETRON HCL 4 MG/2ML IJ SOLN
INTRAMUSCULAR | Status: DC | PRN
  Administered 2013-06-24: 4 mg via INTRAVENOUS

## 2013-06-24 MED ORDER — OXYCODONE HCL 5 MG PO TABS: 5.0000 mg | ORAL_TABLET | Freq: Once | ORAL | Status: DC | PRN

## 2013-06-24 MED ORDER — SENNA 8.6 MG PO TABS
2.0000 | ORAL_TABLET | Freq: Two times a day (BID) | ORAL | Status: DC
  Administered 2013-06-24 – 2013-06-28 (×7): 17.2 mg via ORAL
  Filled 2013-06-24 (×13): qty 2

## 2013-06-24 SURGICAL SUPPLY — 54 items
BANDAGE ELASTIC 6 VELCRO ST LF (GAUZE/BANDAGES/DRESSINGS) ×2 IMPLANT
BANDAGE ESMARK 6X9 LF (GAUZE/BANDAGES/DRESSINGS) ×1 IMPLANT
BLADE SAW RECIP 87.9 MT (BLADE) ×2 IMPLANT
BNDG COHESIVE 6X5 TAN STRL LF (GAUZE/BANDAGES/DRESSINGS) ×4 IMPLANT
BNDG ESMARK 6X9 LF (GAUZE/BANDAGES/DRESSINGS) ×2
CHLORAPREP W/TINT 26ML (MISCELLANEOUS) ×2 IMPLANT
CLOTH BEACON ORANGE TIMEOUT ST (SAFETY) ×2 IMPLANT
COVER SURGICAL LIGHT HANDLE (MISCELLANEOUS) ×2 IMPLANT
CUFF TOURNIQUET SINGLE 34IN LL (TOURNIQUET CUFF) ×2 IMPLANT
CUFF TOURNIQUET SINGLE 44IN (TOURNIQUET CUFF) IMPLANT
DRAPE EXTREMITY T 121X128X90 (DRAPE) ×2 IMPLANT
DRAPE INCISE IOBAN 66X45 STRL (DRAPES) ×2 IMPLANT
DRAPE PROXIMA HALF (DRAPES) IMPLANT
DRAPE U-SHAPE 47X51 STRL (DRAPES) ×2 IMPLANT
DRSG MEPITEL 4X7.2 (GAUZE/BANDAGES/DRESSINGS) ×2 IMPLANT
DRSG PAD ABDOMINAL 8X10 ST (GAUZE/BANDAGES/DRESSINGS) ×2 IMPLANT
ELECT CAUTERY BLADE 6.4 (BLADE) IMPLANT
ELECT REM PT RETURN 9FT ADLT (ELECTROSURGICAL) ×2
ELECTRODE REM PT RTRN 9FT ADLT (ELECTROSURGICAL) ×1 IMPLANT
EVACUATOR 1/8 PVC DRAIN (DRAIN) IMPLANT
GLOVE BIO SURGEON STRL SZ8 (GLOVE) ×2 IMPLANT
GLOVE BIOGEL PI IND STRL 8 (GLOVE) ×1 IMPLANT
GLOVE BIOGEL PI INDICATOR 8 (GLOVE) ×1
GOWN PREVENTION PLUS XLARGE (GOWN DISPOSABLE) ×2 IMPLANT
GOWN STRL NON-REIN LRG LVL3 (GOWN DISPOSABLE) ×2 IMPLANT
IMMOBILIZER KNEE 20 (SOFTGOODS) ×2
IMMOBILIZER KNEE 20 THIGH 36 (SOFTGOODS) ×1 IMPLANT
KIT BASIN OR (CUSTOM PROCEDURE TRAY) ×2 IMPLANT
KIT ROOM TURNOVER OR (KITS) ×2 IMPLANT
MANIFOLD NEPTUNE II (INSTRUMENTS) IMPLANT
NS IRRIG 1000ML POUR BTL (IV SOLUTION) ×2 IMPLANT
PACK GENERAL/GYN (CUSTOM PROCEDURE TRAY) ×2 IMPLANT
PAD ARMBOARD 7.5X6 YLW CONV (MISCELLANEOUS) ×4 IMPLANT
PAD CAST 4YDX4 CTTN HI CHSV (CAST SUPPLIES) ×1 IMPLANT
PADDING CAST ABS 6INX4YD NS (CAST SUPPLIES) ×1
PADDING CAST ABS COTTON 6X4 NS (CAST SUPPLIES) ×1 IMPLANT
PADDING CAST COTTON 4X4 STRL (CAST SUPPLIES) ×1
PADDING CAST COTTON 6X4 STRL (CAST SUPPLIES) ×2 IMPLANT
SPONGE GAUZE 4X4 12PLY (GAUZE/BANDAGES/DRESSINGS) ×2 IMPLANT
SPONGE LAP 18X18 X RAY DECT (DISPOSABLE) IMPLANT
STAPLER VISISTAT 35W (STAPLE) ×2 IMPLANT
STOCKINETTE IMPERVIOUS LG (DRAPES) IMPLANT
SUT MNCRL AB 3-0 PS2 18 (SUTURE) ×6 IMPLANT
SUT PDS 2 0 (SUTURE) ×2 IMPLANT
SUT PDS AB 0 CT 36 (SUTURE) ×2 IMPLANT
SUT PROLENE 3 0 PS 2 (SUTURE) ×2 IMPLANT
SUT PROLENE 3 0 SH 48 (SUTURE) IMPLANT
SUT SILK 2 0 (SUTURE) ×1
SUT SILK 2-0 18XBRD TIE 12 (SUTURE) ×1 IMPLANT
SWAB COLLECTION DEVICE MRSA (MISCELLANEOUS) IMPLANT
TOWEL OR 17X24 6PK STRL BLUE (TOWEL DISPOSABLE) ×2 IMPLANT
TOWEL OR 17X26 10 PK STRL BLUE (TOWEL DISPOSABLE) ×2 IMPLANT
TUBE ANAEROBIC SPECIMEN COL (MISCELLANEOUS) IMPLANT
WATER STERILE IRR 1000ML POUR (IV SOLUTION) IMPLANT

## 2013-06-24 NOTE — Significant Event (Signed)
Patient back from surgery left bka dsg D&I with brace to leg elevated on pillow. vss 117/86 hr 87, 96% 0N 2 LIT O2. A&OX4. Family at bedside.

## 2013-06-24 NOTE — Progress Notes (Signed)
[facility] PROGRESS NOTE Interim History: 37 y.o. female who presents to the ED at [facility] hospital with c/o SOB. Symptoms have been going on for 2-3 days now, but she has had some SOB and cough for several months. Cough is productive of a clear frothy sputum. Associated with pedal edema increasing over the last few months. 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 With purulence. - Consulted orthopedics, MRI showed osteomyelitis and septic arthritis. , start vanc and zosyn 10.3.2014. -  Place NPO for possible I and D.  Acute respiratory failure/Acute combined systolic and diastolic congestive heart failure/ F 45-50% with inf WMA by 2D 02/05/13/ Acute bronchitis: - Hold lasix. Cont coreg and Bidil. Eu volemic. - Electrolytes stable. Echo 10.02.2014:ejection fraction was in the range of 40% to 45% -  Acute bronchitis. Breathing improved  CKD (chronic kidney disease) stage 3, GFR 30-59 ml/min due to DM -  Cr at baseline 2.5. 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  Essential hypertension - Improved control on bidil tachycardia resolved - Cont bidil.  Type II or unspecified type diabetes mellitus with unspecified complication, uncontrolled - cont lantus plus SSI.    Code Status: full Family Communication: none  Disposition Plan: inpatinet   Consultants:  none  Procedures: ECHO: 10.01.2014  Antibiotics: Change to vanc and zosyn 10.3.2014 HPI/Subjective: No compalins  Objective: Filed Vitals:   06/20/13 1900 06/20/13 2126 06/21/13 0110 06/21/13 0625  BP: 142/94 142/95 139/89 146/94  Pulse: 98 109 95 107  Temp: 98 F (36.7  C) 98.1 F (36.7 C) 98.6 F (37 C) 99.5 F (37.5 C)  TempSrc: Oral Oral Oral Oral  Resp: 20 22 20 18   Height: 5\' 6"  (1.676 m)     Weight: 100.789 kg (222 lb 3.2 oz)   99.565 kg (219 lb 8 oz)  SpO2: 100% 100% 98% 100%     Exam:  General: Alert, awake, oriented x3, in no acute distress.  HEENT: No bruits, no goiter. No appriciated JVD. Heart: Regular rate and rhythm, without murmurs, rubs, gallops.  Lungs: Good air movement, crackles on the right.  Abdomen: Soft, nontender, nondistended, positive bowel sounds.    Data Reviewed: Basic Metabolic Panel:  Recent Labs Lab 06/21/13 0620  NA 132*  K 4.3  CL 102  CO2 17*  GLUCOSE 69*  BUN 18  CREATININE 2.10*  CALCIUM 8.1*  MG 1.7   Liver Function Tests: No results found for this basename: AST, ALT, ALKPHOS, BILITOT, PROT, ALBUMIN,  in the last 168 hours No results found for this basename: LIPASE, AMYLASE,  in the last 168 hours No results found for this basename: AMMONIA,  in the last 168 hours CBC:  Recent Labs Lab 06/21/13 0620  WBC PENDING  NEUTROABS PENDING  HGB 6.9*  HCT 21.9*  MCV 75.3*  PLT PENDING   Cardiac Enzymes:  Recent Labs Lab 06/21/13 0009 06/21/13 0620  TROPONINI <0.30 <0.30   BNP (last 3 results)  Recent Labs  02/07/13 0535  PROBNP 16430.0*   CBG:  Recent Labs Lab 06/20/13 2115 06/21/13 0704  GLUCAP 84 76    No results found for this  or any previous visit (from the past 240 hour(s)).   Studies: No results found.  Scheduled Meds: . carvedilol  6.25 mg Oral BID WC  . furosemide  40 mg Intravenous Q6H  . heparin  5,000 Units Subcutaneous Q8H  . insulin aspart  0-9 Units Subcutaneous TID WC  . insulin glargine  9 Units Subcutaneous QHS  . pantoprazole  80 mg Oral Q1200  . sodium chloride  3 mL Intravenous Q12H   Continuous Infusions:    Charlynne Cousins  [facility] Pager [phone]. If 8PM-8AM, please contact night-coverage at [email or web address], password  [facility] 06/21/2013, 9:44 AM  LOS: 1 day

## 2013-06-24 NOTE — Progress Notes (Signed)
PT Cancellation/Discharge Note  Patient Details Name: Karen [name] MRN: [id number] DOB: [birth year]   Cancelled Treatment:    Reason Eval/Treat Not Completed: Medical issues which prohibited therapy;Patient at procedure or test/unavailable.  Patient going to OR today for Lt foot.  Will discontinue PT. **MD:  Please reorder PT consult post-op when appropriate for patient. Thank you!   Despina Pole 06/24/2013, 12:46 PM Carita Pian. Sanjuana Kava, [place] Pager [phone]

## 2013-06-24 NOTE — Significant Event (Signed)
Dr Doran Durand called to inform him of blood sugar of  75 pre op.

## 2013-06-24 NOTE — Anesthesia Preprocedure Evaluation (Addendum)
Anesthesia Evaluation  Patient identified by MRN, date of birth, ID band Patient awake    Reviewed: Allergy & Precautions, H&P , NPO status   History of Anesthesia Complications Negative for: history of anesthetic complications  Airway Mallampati: II      Dental  (+) Partial Upper,    Pulmonary shortness of breath,  breath sounds clear to auscultation        Cardiovascular Exercise Tolerance: Poor hypertension, Pt. on medications and Pt. on home beta blockers + Peripheral Vascular Disease and +CHF Rhythm:Regular Rate:Normal  *   ------------------------------------------------------------ Transthoracic Echocardiography  Patient:    Karen [name], Karen [name] Date: 06/22/2013 Gender:     F Age:        37 Height:     170.2cm Weight:     99.3kg BSA:        2.73m^2 Pt. Status: Room:       4E08C    PERFORMING   Shvc  ADMITTING    Rai, [place], Dimple Casey  SONOGRAPHER  Jimmy Reel cc:  ------------------------------------------------------------ LV EF: 40% -   45%  ------------------------------------------------------------ Indications:      Cardiomyopathy - ischemic 414.8.  CHF - 428.0.  ------------------------------------------------------------ Study Conclusions  - Left ventricle: The cavity size was normal. Wall thickness   was normal. Systolic function was mildly to moderately   reduced. The estimated ejection fraction was in the range   of 40% to 45%. There is moderate hypokinesis of the   inferoseptal myocardium. Doppler parameters are consistent   with a reversible restrictive pattern, indicative of   decreased left ventricular diastolic compliance and/or   increased left atrial pressure (grade 3 diastolic   dysfunction). Doppler parameters are consistent with both   elevated ventricular end-diastolic filling pressure and   elevated  left atrial filling pressure.    Neuro/Psych    GI/Hepatic GERD-  Medicated and Controlled,  Endo/Other  diabetes, Poorly Controlled, Type 2Morbid obesity  Renal/GU Renal InsufficiencyRenal disease     Musculoskeletal   Abdominal (+) + obese,   Peds  Hematology  (+) Blood dyscrasia, anemia ,   Anesthesia Other Findings   Reproductive/Obstetrics                       Anesthesia Physical Anesthesia Plan  ASA: III  Anesthesia Plan: General   Post-op Pain Management:    Induction: Intravenous  Airway Management Planned: Oral ETT  Additional Equipment:   Intra-op Plan:   Post-operative Plan: Extubation in OR  Informed Consent: I have reviewed the patients History and Physical, chart, labs and discussed the procedure including the risks, benefits and alternatives for the proposed anesthesia with the patient or authorized representative who has indicated his/her understanding and acceptance.   Dental advisory given  Plan Discussed with: CRNA and Surgeon  Anesthesia Plan Comments:         Anesthesia Quick Evaluation

## 2013-06-24 NOTE — Anesthesia Procedure Notes (Signed)
Procedure Name: Intubation Date/Time: 06/24/2013 3:20 PM Performed by: Jacquiline Doe A Pre-anesthesia Checklist: Patient identified, Timeout performed, Emergency Drugs available, Suction available and Patient being monitored Patient Re-evaluated:Patient Re-evaluated prior to inductionOxygen Delivery Method: Circle system utilized Preoxygenation: Pre-oxygenation with 100% oxygen Intubation Type: IV induction, Rapid sequence and Cricoid Pressure applied Laryngoscope Size: Mac and 4 Grade View: Grade I Tube type: Oral Tube size: 7.5 mm Number of attempts: 1 Airway Equipment and Method: Stylet Placement Confirmation: ETT inserted through vocal cords under direct vision,  breath sounds checked- equal and bilateral and positive ETCO2 Secured at: 21 cm Tube secured with: Tape Dental Injury: Teeth and Oropharynx as per pre-operative assessment

## 2013-06-24 NOTE — Anesthesia Postprocedure Evaluation (Signed)
  Anesthesia Post-op Note  Patient: Karen [name]  Procedure(s) Performed: Procedure(s): AMPUTATION BELOW KNEE  (Left)  Patient Location: PACU  Anesthesia Type:General  Level of Consciousness: awake and sedated  Airway and Oxygen Therapy: Patient Spontanous Breathing  Post-op Pain: mild  Post-op Assessment: Post-op Vital signs reviewed  Post-op Vital Signs: stable  Complications: No apparent anesthesia complications

## 2013-06-24 NOTE — Preoperative (Signed)
Beta Blockers   Reason not to administer Beta Blockers:Last Carvedilol given on 06/22/13 at 0957. Patient and family refusing since that time.

## 2013-06-24 NOTE — Op Note (Signed)
NAMEBENNYE, CILIBERTI NO.:  [id number]  MEDICAL RECORD NO.:  [id number]  LOCATION:  4E08C                        FACILITY:  [facility]  PHYSICIAN:  Wylene Simmer, MD        DATE OF BIRTH:  23-Dec-1975  DATE OF PROCEDURE:  06/24/2013 DATE OF DISCHARGE:                              OPERATIVE REPORT   POSTOPERATIVE DIAGNOSES:  Left foot abscess and metatarsal osteomyelitis.  POSTOPERATIVE DIAGNOSES:  Left foot abscess and metatarsal osteomyelitis.  PROCEDURE:  Left below-knee amputation.  SURGEON:  Wylene Simmer, MD.  ANESTHESIA:  General.  ESTIMATED BLOOD LOSS:  Minimal.  TOURNIQUET TIME:  38 minutes at 250 mmHg.  COMPLICATIONS:  None apparent.  DISPOSITION:  Extubated awake and stable to recovery.  INDICATIONS FOR PROCEDURE:  The patient is a 37 year old female with past medical history significant for diabetes with severe peripheral neuropathy.  She is status post left hallux amputation in the remote past, and fourth toe amputation in the recent past.  She has a new development of a severe abscess of the forefoot with MRI findings of osteomyelitis of the entirety of the third and fourth metatarsals.  She presents now for operative treatment of this condition.  She understands the risks and benefits, the alternative treatment options and elects surgical treatment.  She specifically understands risks of bleeding, infection, nerve damage, blood clots, need for additional surgery, delayed healing, revision amputation, and death.  PROCEDURE IN DETAIL:  After preoperative consent was obtained, the correct operative site was identified.  The patient was brought to the operating room and placed supine on the operating table.  General anesthesia was induced.  The patient was already on therapeutic antibiotics.  The left lower extremity was carefully examined.  There was severe involvement of the forefoot noted with copious purulent drainage from the plantar aspect  of the foot as well as the previous fourth toe amputation site.  There was compromise of nearly all of the plantar skin beneath the metatarsals.  Given the necessity of complete excision of the third and fourth metatarsals with the resultant forefoot instability as well as the severe compromise of the soft tissue, the decision was made to proceed with below-knee amputation.  There was no reasonable way to salvage the forefoot at the level of the transmetatarsal amputation given the severe involvement of the forefoot.  The left lower extremity was then prepped and draped in standard sterile fashion with the tourniquet around the thigh.  A posterior flap incision was marked on the skin 15 cm distal from the medial joint line of the knee.  The extremity was exsanguinated and then tourniquet was inflated to 250 mmHg.  Incision was made and sharp dissection carried down through the skin and subcutaneous tissue to the level of the anterior aspect of the tibia.  Periosteum was elevated.  An oscillating saw was used to cut the tibia approximately a centimeter proximal from the skin incision.  The fibula was then dissected and the soft tissues protected as it was cut with the oscillating saw.  The amputation knife was then used to complete the amputation contouring the posterior flap appropriately.  Amputated extremity was then passed  off the field as a specimen to pathology.  The cut surfaces of bone were contoured with a rasp and the saw blade.  The named vascular bundles were all ligated with 2-0 silk ties.  All of the named nerves were sharply divided while being held on gentle traction.  They were allowed to retract back into healthy soft tissue.  Set left saphenous vein was cauterized as well. The wound was then irrigated copiously.  20 mL of Exparel was mixed with 20 mL of normal saline.  This was infiltrated around the area of the tibial nerve and at the deep peroneal nerve, and the  saphenous nerve and sural nerves.  The gastrocnemius fascia was then repaired to the anterior fascia of the leg using imbricating sutures of 0 PDS.  The anterior fascia was repaired to the posterior fascia using simple sutures of 0 PDS, the subcutaneous tissue was approximated with inverted simple sutures of 3-0 Monocryl.  The skin was closed with staples.  The remaining Exparel was infiltrated into the subcutaneous tissues around the surgical incision.  Sterile dressings were applied followed by a well-padded compression wrap.  The tourniquet was released at 38 minutes after application of the dressings, the lower extremity was immobilized in a 20 inch knee immobilizer.  The patient was then awakened from anesthesia and transported to recovery room in stable condition.  FOLLOWUP PLAN:  The patient will be nonweightbearing on the left lower extremity.  She will likely need a stay in a [facility] for acute rehab.     Wylene Simmer, MD     JH/MEDQ  D:  06/24/2013  T:  06/24/2013  Job:  [id number]

## 2013-06-24 NOTE — Transfer of Care (Signed)
Immediate Anesthesia Transfer of Care Note  Patient: Karen [name]  Procedure(s) Performed: Procedure(s): AMPUTATION BELOW KNEE  (Left)  Patient Location: PACU  Anesthesia Type:General  Level of Consciousness: oriented, sedated, patient cooperative and responds to stimulation  Airway & Oxygen Therapy: Patient Spontanous Breathing and Patient connected to face mask oxygen  Post-op Assessment: Report given to PACU RN and Post -op Vital signs reviewed and stable  Post vital signs: Reviewed and stable  Complications: No apparent anesthesia complications

## 2013-06-24 NOTE — Progress Notes (Signed)
Subjective: Pt denies any pain in her foot.  She is feeling better now on abx.  No n/v/f/c.   Objective: Vital signs in last 24 hours: Temp:  [97.4 F (36.3 C)-100.6 F (38.1 C)] 98.1 F (36.7 C) Jul 14, 2023 0425) Pulse Rate:  [78-113] 89 July 14, 2023 0425) Resp:  [18-34] 34 07-14-2023 0425) BP: (111-133)/(65-87) 115/76 mmHg 2023/07/14 0425) SpO2:  [95 %-100 %] 100 % 07-14-2023 0425) Weight:  [103.1 kg (227 lb 4.7 oz)] 103.1 kg (227 lb 4.7 oz) 14-Jul-2023 0425)  Intake/Output from previous day: 10/03 0701 - 2023/07/14 0700 In: 1947.5 [P.O.:1182; I.V.:136.3; Blood:379.2; IV Piggyback:250] Out: 2 [Urine:2] Intake/Output this shift: Total I/O In: 240 [P.O.:240] Out: -    Recent Labs  06/23/13 0550  HGB 6.1*    Recent Labs  06/23/13 0550  WBC 18.5*  RBC 2.50*  HCT 18.8*  PLT 287    Recent Labs  06/22/13 0500 06/23/13 0550  NA 134* 133*  K 4.1 3.6  CL 104 105  CO2 19 17*  BUN 21 24*  CREATININE 2.45* 2.78*  GLUCOSE 106* 79  CALCIUM 7.4* 6.7*   No results found for this basename: LABPT, INR,  in the last 72 hours  PE:  left foot with open wound dorsally with sero purulent drainage.  Swelling about the foot.  Skin o/w intact.  Sens to LT absent at the foretoo.  MRI shows abscess at the forefoot as well as osteo of the 3rd and 4th metatarsals along their full length and pyarthrosis of the 3rd mtp joint.  Assessment/Plan: Left foot abscess and osteomyelitis - to OR today for transmet v. bk amputation.  I had a long discussion with the pt regarding how to best treat this recurrent infection.  Given the osteo of the forefoot she needs at least transmet amputation with complete excision of the 3rd and 4th MTs.  I'm concerned that there may be inadequate healthy skin to allow closure.  As a result she may require bka to achieve a weight bearing extremity.  She understands the plan and agrees.  The risks and benefits of the alternative treatment options have been discussed in detail.  The patient  wishes to proceed with surgery and specifically understands risks of bleeding, infection, nerve damage, blood clots, need for additional surgery, amputation and death.    Wylene Simmer 13-Jul-2013, 11:47 AM

## 2013-06-24 NOTE — Brief Op Note (Signed)
06/20/2013 - 06/24/2013  4:38 PM  PATIENT:  Karen [name]  37 y.o. female  PRE-OPERATIVE DIAGNOSIS:  osteomyelitis;abscess left foot  POST-OPERATIVE DIAGNOSIS:  same  Procedure(s): Left below knee amputation  SURGEON:  Wylene Simmer, MD  ASSISTANT: n/a  ANESTHESIA:   General  EBL:  minimal   TOURNIQUET:  38 min at [id number] mm Hg  COMPLICATIONS:  None apparent  DISPOSITION:  Extubated, awake and stable to recovery.  DICTATION ID:  [id number]

## 2013-06-25 DIAGNOSIS — N179 Acute kidney failure, unspecified: Secondary | ICD-10-CM

## 2013-06-25 LAB — BASIC METABOLIC PANEL
BUN: 33 mg/dL — ABNORMAL HIGH (ref 6–23)
Calcium: 7.2 mg/dL — ABNORMAL LOW (ref 8.4–10.5)
Chloride: 100 mEq/L (ref 96–112)
Creatinine, Ser: 3.05 mg/dL — ABNORMAL HIGH (ref 0.50–1.10)
GFR calc Af Amer: 22 mL/min — ABNORMAL LOW (ref >90)
GFR calc non Af Amer: 19 mL/min — ABNORMAL LOW (ref >90)
Glucose, Bld: 106 mg/dL — ABNORMAL HIGH (ref 70–99)
Potassium: 4 mEq/L (ref 3.5–5.1)
Sodium: 129 mEq/L — ABNORMAL LOW (ref 135–145)

## 2013-06-25 LAB — GLUCOSE, CAPILLARY
Glucose-Capillary: 75 mg/dL (ref 70–99)
Glucose-Capillary: 106 mg/dL — ABNORMAL HIGH (ref 70–99)
Glucose-Capillary: 90 mg/dL (ref 70–99)
Glucose-Capillary: 85 mg/dL (ref 70–99)

## 2013-06-25 LAB — TYPE AND SCREEN
ABO/RH(D): O POS
Antibody Screen: NEGATIVE
Unit division: 0
Unit division: 0

## 2013-06-25 LAB — CBC
HCT: 26.7 % — ABNORMAL LOW (ref 36.0–46.0)
Hemoglobin: 8.9 g/dL — ABNORMAL LOW (ref 12.0–15.0)
MCH: 25.5 pg — ABNORMAL LOW (ref 26.0–34.0)
MCHC: 33.3 g/dL (ref 30.0–36.0)
Platelets: 292 10*3/uL (ref 150–400)
RBC: 3.49 MIL/uL — ABNORMAL LOW (ref 3.87–5.11)
RDW: 16.8 % — ABNORMAL HIGH (ref 11.5–15.5)

## 2013-06-25 MED ORDER — AZITHROMYCIN 500 MG PO TABS
500.0000 mg | ORAL_TABLET | Freq: Every day | ORAL | Status: AC
  Administered 2013-06-25 – 2013-06-26 (×2): 500 mg via ORAL
  Filled 2013-06-25 (×2): qty 1

## 2013-06-25 MED ORDER — OXYCODONE HCL 5 MG PO TABS
5.0000 mg | ORAL_TABLET | Freq: Four times a day (QID) | ORAL | Status: DC | PRN
  Administered 2013-06-25 – 2013-06-26 (×3): 5 mg via ORAL
  Filled 2013-06-25 (×4): qty 1

## 2013-06-25 MED ORDER — FUROSEMIDE 40 MG PO TABS
40.0000 mg | ORAL_TABLET | Freq: Every day | ORAL | Status: DC
  Administered 2013-06-25: 40 mg via ORAL
  Filled 2013-06-25 (×2): qty 1

## 2013-06-25 MED ORDER — INSULIN ASPART 100 UNIT/ML ~~LOC~~ SOLN
0.0000 [IU] | Freq: Three times a day (TID) | SUBCUTANEOUS | Status: DC
  Administered 2013-06-26 – 2013-06-28 (×5): 1 [IU] via SUBCUTANEOUS
  Administered 2013-06-29: 17:00:00 2 [IU] via SUBCUTANEOUS
  Administered 2013-06-29 – 2013-06-30 (×3): 1 [IU] via SUBCUTANEOUS

## 2013-06-25 MED ORDER — OXYCODONE-ACETAMINOPHEN 10-325 MG PO TABS: 1.0000 | ORAL_TABLET | Freq: Four times a day (QID) | ORAL | Status: DC | PRN

## 2013-06-25 MED ORDER — OXYCODONE HCL 5 MG PO TABS
5.0000 mg | ORAL_TABLET | ORAL | Status: DC | PRN
  Administered 2013-06-25: 10 mg via ORAL
  Filled 2013-06-25: qty 2

## 2013-06-25 MED ORDER — TRIAMCINOLONE ACETONIDE 0.025 % EX CREA
1.0000 "application " | TOPICAL_CREAM | Freq: Three times a day (TID) | CUTANEOUS | Status: DC
  Administered 2013-06-25 – 2013-06-30 (×11): 1 via TOPICAL
  Filled 2013-06-25: qty 15

## 2013-06-25 MED ORDER — DIPHENHYDRAMINE HCL 25 MG PO CAPS
25.0000 mg | ORAL_CAPSULE | Freq: Four times a day (QID) | ORAL | Status: DC | PRN
  Administered 2013-06-25 – 2013-06-30 (×4): 25 mg via ORAL
  Filled 2013-06-25 (×4): qty 1

## 2013-06-25 MED ORDER — OXYCODONE-ACETAMINOPHEN 5-325 MG PO TABS
1.0000 | ORAL_TABLET | Freq: Four times a day (QID) | ORAL | Status: DC | PRN
  Administered 2013-06-25 – 2013-06-30 (×5): 1 via ORAL
  Filled 2013-06-25 (×5): qty 1

## 2013-06-25 MED ORDER — FUROSEMIDE 10 MG/ML IJ SOLN: 40.0000 mg | Freq: Two times a day (BID) | INTRAMUSCULAR | Status: DC

## 2013-06-25 NOTE — Evaluation (Signed)
Physical Therapy Evaluation Patient Details Name: Karen [name] MRN: [id number] DOB: [birth year] Today's Date: 06/25/2013 Time: [id number] PT Time Calculation (min): 23 min  PT Assessment / Plan / Recommendation History of Present Illness    37 y.o. female PMHx diabetes, severe peripheral neuropathy underwent LEFT BKA 10/4 due to abcess/osteomyelitis in foot   Clinical Impression  Pt presents with significant restrictions to mobility, will need intensive rehab to progress to independent at wheelchair level and prepare for prosthesis fitting.  Plan to treat acutely until d/c venue can be arranged.  Note CIR c/s ordered.    PT Assessment  Patient needs continued PT services    Follow Up Recommendations  CIR    Does the patient have the potential to tolerate intense rehabilitation      Barriers to Discharge Inaccessible home environment;Decreased caregiver support      Equipment Recommendations  Wheelchair (measurements PT);Wheelchair cushion (measurements PT);Rolling walker with 5" wheels;3in1 (PT) (drop arm wc with amputee pad)    Recommendations for Other Services Rehab consult   Frequency Min 3X/week    Precautions / Restrictions Precautions Precautions: Fall;Other (comment) (BKA left) Precaution Comments: avoid prolonged knee or hip flexion surgical side, KI in place this eval Required Braces or Orthoses: Knee Immobilizer - Left Knee Immobilizer - Left: Other (comment) (does not specifiy) Restrictions Weight Bearing Restrictions: Yes LLE Weight Bearing: Non weight bearing Other Position/Activity Restrictions: elevate LEFT extremity, but do not place pillow behind knee.  Lay on side or prone if able to stretch hip extensor   Pertinent Vitals/Pain Min to mod pain and phantom sensation left leg      Mobility  Bed Mobility Bed Mobility: Supine to Sit;Sit to Supine;Scooting to [facility];Sitting - Scoot to Edge of Bed Supine to Sit: HOB elevated;With rails;3: Mod assist Sitting -  Scoot to Edge of Bed: 4: Min assist;With rail Sit to Supine: 4: Min assist;With rail;HOB flat Scooting to HOB: 3: Mod assist (with bed pad) Details for Bed Mobility Assistance: bed pad and instructional cues along with physical assist; increased time to move to EOB with onset pain, grimacing, able to acheive partial sitting, needs UE support and assist to shift hips and sit without HOB elevated.  Educated on head/hips relationship for scooting and assist to move hips to RIGHT and back to [facility]. Transfers Transfers: Not assessed Ambulation/Gait Ambulation/Gait Assistance: Not tested (comment) Stairs: No Wheelchair Mobility Wheelchair Mobility: No    Exercises     PT Diagnosis: Difficulty walking  PT Problem List: Obesity;Pain;Decreased safety awareness;Decreased knowledge of use of DME;Decreased mobility;Decreased balance;Decreased activity tolerance;Decreased range of motion;Decreased strength PT Treatment Interventions: Wheelchair mobility training;Patient/family education;Balance training;Therapeutic exercise;Therapeutic activities;Functional mobility training;DME instruction     PT Goals(Current goals can be found in the care plan section) Acute Rehab PT Goals PT Goal Formulation: With patient Time For Goal Achievement: 07/09/13 Potential to Achieve Goals: Good  Visit Information  Last PT Received On: 06/25/13 Assistance Needed: +2 (safety for transfers, can see +1 EOB)       Prior [place] expects to be discharged to:: Private residence Living Arrangements: Children (105 yo twins, 82 yo; other non specific family 'in and out') Available Help at Discharge: Family;Personal care attendant;Available 24 hours/day (per pt) Type of Home: House Home Access: Stairs to enter [employer] of Steps: 5 Home Layout: One level Home Equipment: None Additional Comments: states landlord resistant to having ramp put in for access to home.  Says "I know we  gonna need a  plan with the kids and all" Prior Function Level of Independence: Independent Communication Communication: No difficulties    Cognition  Cognition Arousal/Alertness: Suspect due to medications;Lethargic Behavior During Therapy: WFL for tasks assessed/performed Overall Cognitive Status: Within Functional Limits for tasks assessed    Extremity/Trunk Assessment Upper Extremity Assessment Upper Extremity Assessment: Defer to OT evaluation Lower Extremity Assessment Lower Extremity Assessment: LLE deficits/detail LLE Deficits / Details: new BKA, assist to lift limb from hip initially, improves with time, pain limiting LLE: Unable to fully assess due to pain   Balance Static Sitting Balance Static Sitting - Balance Support: Bilateral upper extremity supported (1 foot on ground; assist to align over hips) Static Sitting - Level of Assistance: 4: Min assist;5: Stand by assistance (progresses over time) Static Sitting - Comment/# of Minutes: 15 (with periods of scooting)  End of Session PT - End of Session Equipment Utilized During Treatment: Gait belt Activity Tolerance: Patient limited by lethargy Patient left: in bed;with call bell/phone within reach Nurse Communication: Mobility status  GP     Herbie Drape 06/25/2013, 3:28 PM

## 2013-06-25 NOTE — Progress Notes (Signed)
[facility] PROGRESS NOTE Interim History: 37 y.o. female who presents to the ED at [facility] hospital with c/o SOB. Symptoms have been going on for 2-3 days now, but she has had some SOB and cough for several months. Cough is productive of a clear frothy sputum. Associated with pedal edema increasing over the last few months. Associated with orthopnea Filed Weights   06/23/13 0537 06/24/13 0425 06/25/13 0406  Weight: 99.8 kg (220 lb 0.3 oz) 103.1 kg (227 lb 4.7 oz) 103.6 kg (228 lb 6.3 oz)   BNP    Component Value Date/Time   PROBNP 55261.0* 06/22/2013 0500    Assessment/Plan: Left foot wound/fevers: - S/p amputation of the left 4th toe. Re-intervin 10.4.2014 with BKA. - Consulted orthopedics, MRI showed osteomyelitis and septic arthritis. , start vanc and zosyn 10.3.2014-10 4.2014. -  Change to azithromycin 2 additional days.  Acute respiratory failure/Acute combined systolic and diastolic congestive heart failure/ F 45-50% with inf WMA by 2D 02/05/13/ Acute bronchitis: - Resume IV lasix lasix.  - +JVD, had fluids running at through the night now in back in heart failure. - weight is up by 4 kg. - azithromycin 2 additional days, no wheezing.  CKD (chronic kidney disease) stage 3, GFR 30-59 ml/min due to DM -  Cr 3.0, probably this is her baseline. - start IV lasix.  Essential hypertension - Improved control on bidil tachycardia resolved - Cont bidil.  Type II or unspecified type diabetes mellitus with unspecified complication, uncontrolled - cont lantus plus SSI.    Code Status: full Family Communication: none  Disposition Plan: inpatinet   Consultants:  none  Procedures: ECHO: 10.01.2014  Antibiotics: Change to vanc and zosyn 10.3.2014 10.4.2014 HPI/Subjective: No compalins  Objective: Filed Vitals:   06/20/13 1900 06/20/13 2126 06/21/13 0110 06/21/13 0625  BP: 142/94 142/95 139/89 146/94  Pulse: 98 109 95 107  Temp: 98 F (36.7 C) 98.1 F (36.7 C)  98.6 F (37 C) 99.5 F (37.5 C)  TempSrc: Oral Oral Oral Oral  Resp: 20 22 20 18   Height: 5\' 6"  (1.676 m)     Weight: 100.789 kg (222 lb 3.2 oz)   99.565 kg (219 lb 8 oz)  SpO2: 100% 100% 98% 100%     Exam:  General: Alert, awake, oriented x3, in no acute distress.  HEENT: No bruits, no goiter. + JVD. Heart: Regular rate and rhythm, without murmurs, rubs, gallops.  Lungs: Good air movement, crackles on the right.  Abdomen: Soft, nontender, nondistended, positive bowel sounds.    Data Reviewed: Basic Metabolic Panel:  Recent Labs Lab 06/21/13 0620  NA 132*  K 4.3  CL 102  CO2 17*  GLUCOSE 69*  BUN 18  CREATININE 2.10*  CALCIUM 8.1*  MG 1.7   Liver Function Tests: No results found for this basename: AST, ALT, ALKPHOS, BILITOT, PROT, ALBUMIN,  in the last 168 hours No results found for this basename: LIPASE, AMYLASE,  in the last 168 hours No results found for this basename: AMMONIA,  in the last 168 hours CBC:  Recent Labs Lab 06/21/13 0620  WBC PENDING  NEUTROABS PENDING  HGB 6.9*  HCT 21.9*  MCV 75.3*  PLT PENDING   Cardiac Enzymes:  Recent Labs Lab 06/21/13 0009 06/21/13 0620  TROPONINI <0.30 <0.30   BNP (last 3 results)  Recent Labs  02/07/13 0535  PROBNP 16430.0*   CBG:  Recent Labs Lab 06/20/13 2115 06/21/13 0704  GLUCAP 84 76    No results found  for this or any previous visit (from the past 240 hour(s)).   Studies: No results found.  Scheduled Meds: . carvedilol  6.25 mg Oral BID WC  . furosemide  40 mg Intravenous Q6H  . heparin  5,000 Units Subcutaneous Q8H  . insulin aspart  0-9 Units Subcutaneous TID WC  . insulin glargine  9 Units Subcutaneous QHS  . pantoprazole  80 mg Oral Q1200  . sodium chloride  3 mL Intravenous Q12H   Continuous Infusions:    Charlynne Cousins  [facility] Pager [phone]. If 8PM-8AM, please contact night-coverage at [email or web address], password [facility] 06/21/2013, 9:44 AM  LOS: 1 day

## 2013-06-25 NOTE — Progress Notes (Signed)
Orthopedics Progress Note  Subjective: I feel better today. Pain controlled.  Objective:  Filed Vitals:   06/25/13 0406  BP: 106/62  Pulse: 86  Temp: 97.3 F (36.3 C)  Resp: 20    General: Awake and alert  Musculoskeletal: left leg dressing intact and knee immobilizer in place.  Leg elevated. Neurovascularly intact  Lab Results  Component Value Date   WBC 20.2* 06/25/2013   HGB 8.9* 06/25/2013   HCT 26.7* 06/25/2013   MCV 76.5* 06/25/2013   PLT 292 06/25/2013       Component Value Date/Time   NA 129* 06/25/2013 0500   K 4.0 06/25/2013 0500   CL 100 06/25/2013 0500   CO2 18* 06/25/2013 0500   GLUCOSE 106* 06/25/2013 0500   BUN 33* 06/25/2013 0500   CREATININE 3.05* 06/25/2013 0500   CALCIUM 7.2* 06/25/2013 0500   GFRNONAA 19* 06/25/2013 0500   GFRAA 22* 06/25/2013 0500    Lab Results  Component Value Date   INR 1.15 02/08/2013    Assessment/Plan: POD #1 s/p Procedure(s): AMPUTATION BELOW KNEE  Stable overnight.  Supportive care. Keep leg elevated  Doran Heater. Veverly Fells, MD 06/25/2013 11:32 AM

## 2013-06-26 DIAGNOSIS — S88119A Complete traumatic amputation at level between knee and ankle, unspecified lower leg, initial encounter: Secondary | ICD-10-CM

## 2013-06-26 DIAGNOSIS — L98499 Non-pressure chronic ulcer of skin of other sites with unspecified severity: Secondary | ICD-10-CM

## 2013-06-26 DIAGNOSIS — I739 Peripheral vascular disease, unspecified: Secondary | ICD-10-CM

## 2013-06-26 LAB — GLUCOSE, CAPILLARY: Glucose-Capillary: 112 mg/dL — ABNORMAL HIGH (ref 70–99)

## 2013-06-26 LAB — BASIC METABOLIC PANEL
BUN: 34 mg/dL — ABNORMAL HIGH (ref 6–23)
CO2: 16 mEq/L — ABNORMAL LOW (ref 19–32)
Calcium: 7.4 mg/dL — ABNORMAL LOW (ref 8.4–10.5)
Creatinine, Ser: 2.86 mg/dL — ABNORMAL HIGH (ref 0.50–1.10)
GFR calc non Af Amer: 20 mL/min — ABNORMAL LOW (ref >90)
Sodium: 129 mEq/L — ABNORMAL LOW (ref 135–145)

## 2013-06-26 LAB — CBC
MCH: 25.3 pg — ABNORMAL LOW (ref 26.0–34.0)
MCHC: 32.8 g/dL (ref 30.0–36.0)
MCV: 77.2 fL — ABNORMAL LOW (ref 78.0–100.0)
Platelets: 348 10*3/uL (ref 150–400)
RDW: 17.1 % — ABNORMAL HIGH (ref 11.5–15.5)

## 2013-06-26 MED ORDER — FUROSEMIDE 10 MG/ML IJ SOLN
60.0000 mg | Freq: Two times a day (BID) | INTRAMUSCULAR | Status: DC
  Administered 2013-06-26: 60 mg via INTRAVENOUS
  Filled 2013-06-26 (×2): qty 6

## 2013-06-26 MED ORDER — FUROSEMIDE 10 MG/ML IJ SOLN
40.0000 mg | Freq: Two times a day (BID) | INTRAMUSCULAR | Status: DC
  Administered 2013-06-26: 40 mg via INTRAVENOUS
  Filled 2013-06-26: qty 4

## 2013-06-26 NOTE — Progress Notes (Signed)
[facility] PROGRESS NOTE Interim History: 37 y.o. female who presents to the ED at [facility] hospital with c/o SOB. Symptoms have been going on for 2-3 days now, but she has had some SOB and cough for several months. Cough is productive of a clear frothy sputum. Associated with pedal edema increasing over the last few months. 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 - Consulted orthopedics, MRI showed osteomyelitis and septic arthritis. , start vanc and zosyn 10.3.2014-10 4.2014. CIR consult. -  Change to azithromycin 1 additional days.  Acute respiratory failure/Acute combined systolic and diastolic congestive heart failure/ F 45-50% with inf WMA by 2D 02/05/13/ Acute bronchitis: - Resume IV lasix. - +JVD, had fluids running at through the night now in back in heart failure. - weight is up by 4 kg. - azithromycin 1 additional days, no wheezing.  CKD (chronic kidney disease) stage 3, GFR 30-59 ml/min due to DM -  Cr 3.0, ? Back in heart failure vs baseline. - start IV lasix.  Essential hypertension - Improved control on bidil tachycardia resolved - Cont bidil.  Type II or unspecified type diabetes mellitus with unspecified complication, uncontrolled - cont lantus plus SSI.    Code Status: full Family Communication: none  Disposition Plan: inpatinet   Consultants:  none  Procedures: ECHO: 10.01.2014  Antibiotics: Change to vanc and zosyn 10.3.2014 10.4.2014 HPI/Subjective: No compalins  Objective: Filed Vitals:   06/20/13 1900 06/20/13 2126 06/21/13 0110 06/21/13 0625  BP: 142/94 142/95 139/89 146/94  Pulse: 98 109 95 107  Temp: 98 F (36.7 C) 98.1  F (36.7 C) 98.6 F (37 C) 99.5 F (37.5 C)  TempSrc: Oral Oral Oral Oral  Resp: 20 22 20 18   Height: 5\' 6"  (1.676 m)     Weight: 100.789 kg (222 lb 3.2 oz)   99.565 kg (219 lb 8 oz)  SpO2: 100% 100% 98% 100%     Exam:  General: Alert, awake, oriented x3, in no acute distress.  HEENT: No bruits, no goiter. + JVD. Heart: Regular rate and rhythm, without murmurs, rubs, gallops.  Lungs: Good air movement, crackles on the right.  Abdomen: Soft, nontender, nondistended, positive bowel sounds.    Data Reviewed: Basic Metabolic Panel:  Recent Labs Lab 06/21/13 0620  NA 132*  K 4.3  CL 102  CO2 17*  GLUCOSE 69*  BUN 18  CREATININE 2.10*  CALCIUM 8.1*  MG 1.7   Liver Function Tests: No results found for this basename: AST, ALT, ALKPHOS, BILITOT, PROT, ALBUMIN,  in the last 168 hours No results found for this basename: LIPASE, AMYLASE,  in the last 168 hours No results found for this basename: AMMONIA,  in the last 168 hours CBC:  Recent Labs Lab 06/21/13 0620  WBC PENDING  NEUTROABS PENDING  HGB 6.9*  HCT 21.9*  MCV 75.3*  PLT PENDING   Cardiac Enzymes:  Recent Labs Lab 06/21/13 0009 06/21/13 0620  TROPONINI <0.30 <0.30   BNP (last 3 results)  Recent Labs  02/07/13 0535  PROBNP 16430.0*   CBG:  Recent Labs Lab 06/20/13 2115 06/21/13 0704  GLUCAP 84 76    No  results found for this or any previous visit (from the past 240 hour(s)).   Studies: No results found.  Scheduled Meds: . carvedilol  6.25 mg Oral BID WC  . furosemide  40 mg Intravenous Q6H  . heparin  5,000 Units Subcutaneous Q8H  . insulin aspart  0-9 Units Subcutaneous TID WC  . insulin glargine  9 Units Subcutaneous QHS  . pantoprazole  80 mg Oral Q1200  . sodium chloride  3 mL Intravenous Q12H   Continuous Infusions:    Charlynne Cousins  [facility] Pager [phone]. If 8PM-8AM, please contact night-coverage at [email or web address], password [facility] 06/21/2013, 9:44 AM   LOS: 1 day

## 2013-06-26 NOTE — Clinical Documentation Improvement (Signed)
THIS DOCUMENT IS NOT A PERMANENT PART OF THE MEDICAL RECORD  Please update your documentation with the medical record to reflect your response to this query. If you need help knowing how to do this please call [phone].  06/26/13  Dr. Aileen Fass,  In a better effort to capture your patient's severity of illness, reflect appropriate length of stay and utilization of resources, a review of the patient medical record has revealed the following information:  Baseline Creatinine this admission - 2.10   (black female)   Repeat Creatinine 06/22/13 - 2.45   (0.35 mg/dl increase in 24 hours from 10/01 to 10/02)  Repeat Creatinine 06/23/13 - 2.78   (0.68 mg/dl increase in 48 hours from 10/01 to 10/03)  Repeat Creatinine 06/24/13 - 3.06   (0.61 mg/dl increase in 48 hours from 10/02 to 10/04)  IV Lasix changed to PO on 06/22/13  Serial Chemistries   Based on your clinical judgment, please document in the progress notes and discharge summary if a condition below provides greater specificity regarding the patient's renal function this admission:   - Acute Kidney Injury on Stage 3 CKD   - Other Condition   - Unable to Clinically Determine   In responding to this query please exercise your independent judgment.    The fact that a query is asked, does not imply that any particular answer is desired or expected.     Possible, Probable, or Suspected conditions may be used with inpatient documentation.   Possible, Probable, or Suspected diagnoses MUST be documented as such at the time of discharge, unless confirmed or ruled out.  Reviewed:  no additional documentation provided  Thank You,  [place]

## 2013-06-26 NOTE — Progress Notes (Signed)
Rehab admissions - Evaluated for possible admission.  I spoke with patient and gave her rehab booklets.  Patient would like to come to inpatient rehab.  I spoke with attending MD.  Patient not medically ready yet.  Will follow progress.  Call me for questions.  RC:[phone]

## 2013-06-26 NOTE — Progress Notes (Signed)
Physical Therapy Treatment Patient Details Name: Karen [name] MRN: [id number] DOB: 28-May-1976 Today's Date: 06/26/2013 Time: [id number] PT Time Calculation (min): 30 min  PT Assessment / Plan / Recommendation  History of Present Illness Pt is s/p left BKA.     PT Comments   Pt admitted with above. Pt currently with functional limitations due to continued balance and endurance deficits.  Pt will benefit from skilled PT to increase their independence and safety with mobility to allow discharge to the venue listed below.   Follow Up Recommendations  CIR                 Equipment Recommendations  Wheelchair (measurements PT);Wheelchair cushion (measurements PT);Rolling walker with 5" wheels;3in1 (PT) (drop arm wheelchair with amputee pad)    Recommendations for Other Services Rehab consult  Frequency Min 3X/week   Progress towards PT Goals Progress towards PT goals: Progressing toward goals  Plan Current plan remains appropriate    Precautions / Restrictions Precautions Precautions: Fall Precaution Comments: avoid prolonged knee or hip flexion surgical side, KI in place this eval Required Braces or Orthoses: Knee Immobilizer - Left Knee Immobilizer - Left: Other (comment) (no specification in chart) Restrictions Weight Bearing Restrictions: Yes LLE Weight Bearing: Non weight bearing Other Position/Activity Restrictions: elevate LEFT extremity, but do not place pillow behind knee.  Lay on side or prone if able to stretch hip extensor   Pertinent Vitals/Pain VSS, no pain    Mobility  Bed Mobility Bed Mobility: Supine to Sit;Sitting - Scoot to Edge of Bed Supine to Sit: 5: Supervision;With rails;HOB elevated Sitting - Scoot to Edge of Bed: 4: Min guard;With rail Sit to Supine: Not Tested (comment) Scooting to [facility]: Not tested (comment) Details for Bed Mobility Assistance: Pt ablee to get to EOB with much less assist today.   Transfers Transfers: Sit to Stand;Stand to Sit;Stand  Pivot Transfers Sit to Stand: 1: +2 Total assist;With upper extremity assist;From bed;From elevated surface Sit to Stand: Patient Percentage: 30% Stand to Sit: 1: +2 Total assist;With upper extremity assist;To chair/3-in-1;With armrests Stand to Sit: Patient Percentage: 30% Stand Pivot Transfers: 1: +2 Total assist Stand Pivot Transfers: Patient Percentage: 40% Details for Transfer Assistance: Pt needed max cues for hand placement. Unsure if pt not processing information or just giving poor effort but was very difficult to achieve sit to stand and took several attempts to do so.  Pt could achieve full upright posture with RW but did not sustain even with max cues and assist.  Pt with posterior pellvic tilt and did not use UEs as much as she should have.  Pt needed incr assist and cues to stand upright and for pivot transfer.  Pt not lifting with UES to move her right foot but sliding her foot.   Ambulation/Gait Ambulation/Gait Assistance: 1: +2 Total assist Ambulation/Gait: Patient Percentage: 40% Ambulation Distance (Feet): 1 Feet Assistive device: Rolling walker Ambulation/Gait Assistance Details: Pt onlly able to take 1 step with RW.  Pt had a lot of difficulty moving forward with RW secondary to not using UEs well to get pushing power.  Pt very fearful as well.  MAx encouragement given but pt "giving up" and had to bring chair to pt.   Gait Pattern: Step-to pattern;Decreased stride length;Trunk flexed;Wide base of support Gait velocity: decreased Stairs: No Wheelchair Mobility Wheelchair Mobility: No    PT Goals (current goals can now be found in the care plan section) Acute Rehab PT Goals Patient Stated Goal: to go home  Visit Information  Last PT Received On: 06/26/13 Assistance Needed: +2 PT/OT Co-Evaluation/Treatment: Yes History of Present Illness: Pt is s/p left BKA.      Subjective Data  Subjective: "I just can't do this," pt states in regards to ambulation. Patient Stated  Goal: to go home   Cognition  Cognition Arousal/Alertness: Awake/alert Behavior During Therapy: WFL for tasks assessed/performed Overall Cognitive Status: Within Functional Limits for tasks assessed (processes information slowly)    Balance  Static Sitting Balance Static Sitting - Balance Support: Bilateral upper extremity supported Static Sitting - Level of Assistance: 5: Stand by assistance Static Sitting - Comment/# of Minutes: 8 Dynamic Standing Balance Dynamic Standing - Balance Support: Bilateral upper extremity supported;During functional activity Dynamic Standing - Level of Assistance: 1: +2 Total assist;Patient percentage (comment) (pt =40%) Dynamic Standing - Balance Activities: Forward lean/weight shifting;Lateral lean/weight shifting Dynamic Standing - Comments: Cannot maintain balance in static or dynamic ways.  End of Session PT - End of Session Equipment Utilized During Treatment: Gait belt Activity Tolerance: Patient limited by fatigue Patient left: in chair;with call bell/phone within reach Nurse Communication: Mobility status;Need for lift equipment        INGOLD,Drakkar Medeiros 06/26/2013, 10:09 AM  [facility] [phone] (pager)

## 2013-06-26 NOTE — Progress Notes (Signed)
Subjective: 2 Days Post-Op Procedure(s) (LRB): AMPUTATION BELOW KNEE  (Left) Patient reports pain as mild.  Well controlled with oral pain meds.  Objective: Vital signs in last 24 hours: Temp:  [97.5 F (36.4 C)-98.5 F (36.9 C)] 98.5 F (36.9 C) (10/06 0621) Pulse Rate:  [94-97] 97 (10/06 0621) Resp:  [18] 18 (10/06 0621) BP: (131-137)/(86-94) 131/94 mmHg (10/06 0621) SpO2:  [97 %-100 %] 97 % (10/06 0621) Weight:  [105.5 kg (232 lb 9.4 oz)] 105.5 kg (232 lb 9.4 oz) (10/06 0621)  Intake/Output from previous day: 10/05 0701 - 10/06 0700 In: 300 [P.O.:300] Out: 2 [Urine:2] Intake/Output this shift:     Recent Labs  06/24/13 1243 06/25/13 0500  HGB 7.8* 8.9*    Recent Labs  06/24/13 1243 06/25/13 0500  WBC 22.3* 20.2*  RBC 3.14* 3.49*  HCT 24.4* 26.7*  PLT 282 292    Recent Labs  06/24/13 1243 06/25/13 0500  NA 128* 129*  K 4.1 4.0  CL 99 100  CO2 18* 18*  BUN 32* 33*  CREATININE 3.06* 3.05*  GLUCOSE 71 106*  CALCIUM 7.2* 7.2*   No results found for this basename: LABPT, INR,  in the last 72 hours  PE:  left LE dressed and dry with knee immobilizer in place.    Assessment/Plan: 2 Days Post-Op Procedure(s) (LRB): AMPUTATION BELOW KNEE  (Left) Up with PT.  OK for d/c at any time from ortho perspective.  NWB on L LE.  Aspirin for DVT prophylaxis.  Inpt rehab would be ideal for pt prior to going home.  OK to d/c abx since area of amputation appeared healthy and uninfected.  Wylene Simmer 06/26/2013, 7:39 AM

## 2013-06-26 NOTE — Evaluation (Signed)
Occupational Therapy Evaluation Patient Details Name: Karen [name] MRN: [id number] DOB: 08-15-1976 Today's Date: 06/26/2013 Time: [id number] OT Time Calculation (min): 29 min  OT Assessment / Plan / Recommendation History of present illness Pt is s/p left BKA.     Clinical Impression   Pt presents with deficits in balance and mobility impeding her ability to perform ADL and ADL transfers.  She demonstrates impaired endurance and UE weakness with anxiety related to falling.  She appears to have some delayed cognitive processing.  At this point, pt requires +2 for OOB activity.  Recommending CIR for intense rehab.  Will follow acutely.    OT Assessment  Patient needs continued OT Services    Follow Up Recommendations  CIR    Barriers to Discharge      Equipment Recommendations  3 in 1 bedside comode;Tub/shower bench;Wheelchair (measurements OT);Wheelchair cushion (measurements OT)    Recommendations for Other Services Rehab consult  Frequency  Min 2X/week    Precautions / Restrictions Precautions Precautions: Fall Precaution Comments: avoid prolonged knee or hip flexion surgical side, KI in place this eval Required Braces or Orthoses: Knee Immobilizer - Left Knee Immobilizer - Left: Other (comment) (no specification in chart) Restrictions Weight Bearing Restrictions: Yes LLE Weight Bearing: Non weight bearing Other Position/Activity Restrictions: elevate LEFT extremity, but do not place pillow behind knee.  Lay on side or prone if able to stretch hip extensor   Pertinent Vitals/Pain VSS, pain 7/10, L LE    ADL  Eating/Feeding: Independent Where Assessed - Eating/Feeding: Bed level Grooming: Wash/dry hands;Wash/dry face;Set up Where Assessed - Grooming: Unsupported sitting Upper Body Bathing: Supervision/safety Where Assessed - Upper Body Bathing: Unsupported sitting Lower Body Bathing: +1 Total assistance;+2 Total assistance Lower Body Bathing: Patient Percentage: 0% Where  Assessed - Lower Body Bathing: Unsupported sitting;Supported sit to stand Upper Body Dressing: Supervision/safety Where Assessed - Upper Body Dressing: Unsupported sitting Lower Body Dressing: +2 Total assistance Lower Body Dressing: Patient Percentage: 0% Where Assessed - Lower Body Dressing: Unsupported sitting;Supported sit to stand Toilet Transfer: +2 Total assistance Toilet Transfer: Patient Percentage: 40% Toilet Transfer Method: Stand pivot Science writer: Therapist, occupational and Hygiene: +2 Total assistance Toileting - Clothing Manipulation and Hygiene: Patient Percentage: 0% Where Assessed - Camera operator Manipulation and Hygiene: Sit to stand from 3-in-1 or toilet Equipment Used: Gait belt;Rolling walker Transfers/Ambulation Related to ADLs: pt with increased anxiety with sit to stand and transfers, required step by step verbal cues and maximum encouragement    OT Diagnosis: Generalized weakness;Cognitive deficits;Acute pain  OT Problem List: Obesity;Pain;Impaired UE functional use;Decreased cognition;Decreased activity tolerance;Impaired balance (sitting and/or standing);Decreased strength;Decreased knowledge of use of DME or AE OT Treatment Interventions: Self-care/ADL training;DME and/or AE instruction;Therapeutic activities;Cognitive remediation/compensation;Patient/family education;Balance training;Therapeutic exercise   OT Goals(Current goals can be found in the care plan section) Acute Rehab OT Goals Patient Stated Goal: to go home OT Goal Formulation: With patient Time For Goal Achievement: 07/03/13 Potential to Achieve Goals: Good ADL Goals Pt Will Perform Grooming: standing;with mod assist Pt Will Perform Lower Body Bathing: sit to/from stand;with mod assist Pt Will Perform Lower Body Dressing: sit to/from stand;with mod assist Pt Will Transfer to Toilet: ambulating;bedside commode;with mod assist Pt Will Perform  Toileting - Clothing Manipulation and hygiene: sitting/lateral leans;with min assist Pt/caregiver will Perform Home Exercise Program: Increased strength;Both right and left upper extremity;With theraband;With written HEP provided;Independently (level 3 t band) Additional ADL Goal #1: Pt will verbalize steps for walker use prior to  standing and generalize in mobility.  Visit Information  Last OT Received On: 06/26/13 Assistance Needed: +2 PT/OT Co-Evaluation/Treatment: Yes History of Present Illness: Pt is s/p left BKA.         Prior [place] expects to be discharged to:: Private residence Living Arrangements: Children Available Help at Discharge: Family;Personal care attendant;Available 24 hours/day Type of Home: House Home Access: Stairs to enter [employer] of Steps: 5 Home Layout: One level Home Equipment: None Prior Function Level of Independence: Independent Communication Communication: No difficulties Dominant Hand: Right         Vision/Perception Vision - History Baseline Vision: Wears glasses all the time Patient Visual Report: No change from baseline   Cognition  Cognition Arousal/Alertness: Awake/alert Behavior During Therapy: WFL for tasks assessed/performed Overall Cognitive Status: Within Functional Limits for tasks assessed (processes information slowly)    Extremity/Trunk Assessment Upper Extremity Assessment Upper Extremity Assessment: Overall WFL for tasks assessed (does not use effectively for sit to stand and ambulation) Lower Extremity Assessment Lower Extremity Assessment: Defer to PT evaluation     Mobility Bed Mobility Bed Mobility: Supine to Sit;Sitting - Scoot to Edge of Bed Supine to Sit: 5: Supervision;With rails;HOB elevated Sitting - Scoot to Edge of Bed: 4: Min guard;With rail Sit to Supine: Not Tested (comment) Scooting to [facility]: Not tested (comment) Details for Bed Mobility Assistance:  Pt ablee to get to EOB with much less assist today.   Transfers Sit to Stand: 1: +2 Total assist;With upper extremity assist;From bed;From elevated surface Sit to Stand: Patient Percentage: 30% Stand to Sit: 1: +2 Total assist;With upper extremity assist;To chair/3-in-1;With armrests Stand to Sit: Patient Percentage: 30% Details for Transfer Assistance: Pt needed max cues for hand placement. Unsure if pt not processing information or just giving poor effort but was very difficult to achieve sit to stand and took several attempts to do so.  Pt could achieve full upright posture with RW but did not sustain even with max cues and assist.  Pt with posterior pellvic tilt and did not use UEs as much as she should have.  Pt needed incr assist and cues to stand upright and for pivot transfer.  Pt not lifting with UES to move her right foot but sliding her foot.       Exercise     Balance Static Sitting Balance Static Sitting - Balance Support: Bilateral upper extremity supported Static Sitting - Level of Assistance: 5: Stand by assistance Static Sitting - Comment/# of Minutes: 8 Dynamic Standing Balance Dynamic Standing - Balance Support: Bilateral upper extremity supported;During functional activity Dynamic Standing - Level of Assistance: 1: +2 Total assist;Patient percentage (comment) (pt =40%) Dynamic Standing - Balance Activities: Forward lean/weight shifting;Lateral lean/weight shifting Dynamic Standing - Comments: Cannot maintain balance in static or dynamic ways.   End of Session OT - End of Session Activity Tolerance:  (limited by anxiety) Patient left: in chair;with call bell/phone within reach  GO     Malka So 06/26/2013, 10:12 AM [phone]

## 2013-06-26 NOTE — Progress Notes (Signed)
Pt a/o, c/o pain, PRN meds given as ordered, pt has small round lesions throughout body, pt has lesion to right posterior calf that is sore and on bilat wrist, pt also has lesion to right 4th toe that is black in color nail beds still blanchable, dressing to left leg clean/dry/intact, will continue to monitor

## 2013-06-26 NOTE — H&P (Signed)
Physical Medicine and Rehabilitation Admission H&P    No chief complaint on file. : HPI: Karen [name] is a 37 y.o. right-handed female with history of diabetes mellitus with peripheral neuropathy chronic renal insufficiency with baseline creatinine 3.58, chronic anemia as well as amputation left great toe 02/06/2013. Admitted 06/20/2013 with left forefoot pain and drainage of wound as well as low-grade fever. Patient also noted increased cough and shortness of breath. CT of the chest showed moderate bilateral pleural effusions/acute CHF. Patient placed on intravenous Lasix therapy for diuresis. Echocardiogram with ejection fraction [id number] grade 3 diastolic dysfunction. Venous Doppler studies lower extremity showed no evidence of DVT.  MRI of left foot showed osteomyelitis distal third and fourth metatarsals. 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 Doran Durand. Postoperative pain management. Subcutaneous heparin added for DVT prophylaxis. Physical therapy evaluation completed 06/25/2013 with recommendations of physical medicine rehabilitation consult to consider inpatient rehabilitation services. Patient was felt to be a good candidate for inpatient rehabilitation services was admitted for comprehensive rehabilitation program  Review of Systems  Respiratory: Positive for shortness of breath.  Cardiovascular: Positive for leg swelling.  Gastrointestinal:  GERD  Musculoskeletal: Positive for myalgias and joint pain.  All other systems reviewed and are negative   Past Medical History  Diagnosis Date  . Hypertension   . Anemia   . History of blood transfusion     "last week" (02/02/2013)  . GERD (gastroesophageal reflux disease)   . Osteomyelitis of toe of left foot     "off and on since 2009; no OR" (02/02/2013)  . Renal failure     acute vs chronic/notes 02/02/2013  . Type II diabetes mellitus 1995  . Exertional shortness of breath     "recently;  it's fluid" (02/02/2013)   Past Surgical History  Procedure Laterality Date  . Cesarean section  10/18/2006  . Amputation Left 02/06/2013    Procedure: AMPUTATION LEFT GREAT TOE;  Surgeon: Wylene Simmer, MD;  Location: [place];  Service: Orthopedics;  Laterality: Left;   History reviewed. No pertinent family history. Social History:  reports that she quit smoking about 8 years ago. Her smoking use included Cigarettes. She has a 30 pack-year smoking history. She has never used smokeless tobacco. She reports that she does not drink alcohol or use illicit drugs. Allergies:  Allergies  Allergen Reactions  . Morphine And Related Rash  . Penicillins Rash   Medications Prior to Admission  Medication Sig Dispense Refill  . calcium carbonate (TUMS - DOSED IN MG ELEMENTAL CALCIUM) 500 MG chewable tablet Chew 1 tablet by mouth as needed for heartburn.      . carvedilol (COREG) 6.25 MG tablet Take 1 tablet (6.25 mg total) by mouth 2 (two) times daily with a meal.  60 tablet  1  . esomeprazole (NEXIUM) 40 MG capsule Take 40 mg by mouth daily before breakfast.      . furosemide (LASIX) 40 MG tablet Take 40 mg by mouth daily.      . hydrochlorothiazide (HYDRODIURIL) 25 MG tablet Take 25 mg by mouth 2 (two) times daily.      . insulin glargine (LANTUS) 100 UNIT/ML injection Inject 18 Units into the skin at bedtime.       Marland Kitchen oxyCODONE-acetaminophen (PERCOCET) 10-325 MG per tablet Take 1 tablet by mouth every 6 (six) hours as needed for pain (leg/back pain).      . triamcinolone (KENALOG) 0.025 % cream Apply 1 application topically 3 (  three) times daily. Apply to entire body for itching      . [DISCONTINUED] calcium carbonate (TUMS - DOSED IN MG ELEMENTAL CALCIUM) 500 MG chewable tablet Chew 1 tablet (200 mg of elemental calcium total) by mouth daily with breakfast.  30 tablet  1  . [DISCONTINUED] clindamycin (CLEOCIN) 300 MG capsule Take 1 capsule (300 mg total) by mouth every 6 (six) hours.  28 capsule  0  .  [DISCONTINUED] Nutritional Supplements (FEEDING SUPPLEMENT, NEPRO CARB STEADY,) LIQD Take 237 mLs by mouth 2 (two) times daily between meals.    0    Home: Home Living Family/patient expects to be discharged to:: Private residence Living Arrangements: Children (3 yo twins, 48 yo; other non specific family 'in and out') Available Help at Discharge: Family;Personal care attendant;Available 24 hours/day (per pt) Type of Home: House Home Access: Stairs to enter [employer] of Steps: 5 Home Layout: One level Home Equipment: None Additional Comments: states landlord resistant to having ramp put in for access to home.  Says "I know we gonna need a plan with the kids and all"   Functional History:    Functional Status:  Mobility: Bed Mobility Bed Mobility: Supine to Sit;Sit to Supine;Scooting to [facility];Sitting - Scoot to Edge of Bed Supine to Sit: HOB elevated;With rails;3: Mod assist Sitting - Scoot to Edge of Bed: 4: Min assist;With rail Sit to Supine: 4: Min assist;With rail;HOB flat Scooting to HOB: 3: Mod assist (with bed pad) Transfers Transfers: Not assessed Ambulation/Gait Ambulation/Gait Assistance: Not tested (comment) Stairs: No Wheelchair Mobility Wheelchair Mobility: No  ADL:    Cognition: Cognition Overall Cognitive Status: Within Functional Limits for tasks assessed Orientation Level: Oriented X4 Cognition Arousal/Alertness: Suspect due to medications;Lethargic Behavior During Therapy: WFL for tasks assessed/performed Overall Cognitive Status: Within Functional Limits for tasks assessed  Physical Exam: Blood pressure 131/94, pulse 97, temperature 98.5 F (36.9 C), temperature source Oral, resp. rate 18, height 5\' 6"  (1.676 m), weight 105.5 kg (232 lb 9.4 oz), last menstrual period 05/22/2013, SpO2 97.00%. Constitutional: She is oriented to person, place, and time. She appears well-developed and well-nourished. obese HENT:  Head: Normocephalic and  atraumatic.  Right Ear: External ear normal.  Left Ear: External ear normal.  Eyes: Conjunctivae and EOM are normal. Pupils are equal, round, and reactive to light.  Neck: Normal range of motion. Neck supple. No JVD present. No tracheal deviation present. No thyromegaly present.  Cardiovascular: Normal rate and regular rhythm.   No murmur heard.  Pulmonary/Chest: No respiratory distress. She has no wheezes. She has no rales.  Decreased breath sounds at the bases but clear to auscultation.  Abdominal: Soft. Bowel sounds are normal. She exhibits no distension.  Musculoskeletal:  Left leg, dressed, expectedly tender to touch and ROM. Able to lift left leg in KI slightly off the bed. Lymphadenopathy:  She has no cervical adenopathy.  Neurological: She is alert and oriented to person, place, and time. No cranial nerve deficit. Coordination normal.  Patient follows commands. UE strenght 5/5 bilaterally. RLE is 4/5 HF,KE to 5/5 at ankle. LLE HF could slightly lift agst gravity. Decreased PP,LT RLE distally.   Skin:  Left BKA site is dressed and appropriately tender. Multiple abrasions, old scars throughout the RLE. Psychiatric: She has a normal mood and affect. Her behavior is normal. Thought content normal   Results for orders placed during the hospital encounter of 06/20/13 (from the past 48 hour(s))  GLUCOSE, CAPILLARY     Status: None  Collection Time    06/24/13 11:50 AM      Result Value Range   Glucose-Capillary 75  70 - 99 mg/dL   Comment 1 Documented in Chart     Comment 2 Notify RN    BASIC METABOLIC PANEL     Status: Abnormal   Collection Time    06/24/13 12:43 PM      Result Value Range   Sodium 128 (*) 135 - 145 mEq/L   Potassium 4.1  3.5 - 5.1 mEq/L   Chloride 99  96 - 112 mEq/L   CO2 18 (*) 19 - 32 mEq/L   Glucose, Bld 71  70 - 99 mg/dL   BUN 32 (*) 6 - 23 mg/dL   Creatinine, Ser 3.06 (*) 0.50 - 1.10 mg/dL   Calcium 7.2 (*) 8.4 - 10.5 mg/dL   GFR calc non Af Amer 19  (*) >90 mL/min   GFR calc Af Amer 21 (*) >90 mL/min   Comment: (NOTE)     The eGFR has been calculated using the CKD EPI equation.     This calculation has not been validated in all clinical situations.     eGFR's persistently <90 mL/min signify possible Chronic Kidney     Disease.  CBC WITH DIFFERENTIAL     Status: Abnormal   Collection Time    06/24/13 12:43 PM      Result Value Range   WBC 22.3 (*) 4.0 - 10.5 K/uL   RBC 3.14 (*) 3.87 - 5.11 MIL/uL   Hemoglobin 7.8 (*) 12.0 - 15.0 g/dL   Comment: POST TRANSFUSION SPECIMEN   HCT 24.4 (*) 36.0 - 46.0 %   MCV 77.7 (*) 78.0 - 100.0 fL   MCH 24.8 (*) 26.0 - 34.0 pg   MCHC 32.0  30.0 - 36.0 g/dL   RDW 16.1 (*) 11.5 - 15.5 %   Platelets 282  150 - 400 K/uL   Neutrophils Relative % 88 (*) 43 - 77 %   Neutro Abs 19.7 (*) 1.7 - 7.7 K/uL   Lymphocytes Relative 5 (*) 12 - 46 %   Lymphs Abs 1.1  0.7 - 4.0 K/uL   Monocytes Relative 7  3 - 12 %   Monocytes Absolute 1.5 (*) 0.1 - 1.0 K/uL   Eosinophils Relative 0  0 - 5 %   Eosinophils Absolute 0.1  0.0 - 0.7 K/uL   Basophils Relative 0  0 - 1 %   Basophils Absolute 0.0  0.0 - 0.1 K/uL  SURGICAL PCR SCREEN     Status: None   Collection Time    06/24/13 12:53 PM      Result Value Range   MRSA, PCR NEGATIVE  NEGATIVE   Staphylococcus aureus NEGATIVE  NEGATIVE   Comment:            The Xpert SA Assay (FDA     approved for NASAL specimens     in patients over 42 years of age),     is one component of     a comprehensive surveillance     program.  Test performance has     been validated by [employer] for patients greater     than or equal to 63 year old.     It is not intended     to diagnose infection nor to     guide or monitor treatment.  PREPARE RBC (CROSSMATCH)     Status: None   Collection  Time    06/24/13  3:00 PM      Result Value Range   Order Confirmation ORDER PROCESSED BY BLOOD BANK    GLUCOSE, CAPILLARY     Status: Abnormal   Collection Time    06/24/13  5:03  PM      Result Value Range   Glucose-Capillary 67 (*) 70 - 99 mg/dL   Comment 1 Notify RN     Comment 2 Call MD NNP PA CNM     Comment 3 Documented in Chart    GLUCOSE, CAPILLARY     Status: None   Collection Time    06/24/13  6:00 PM      Result Value Range   Glucose-Capillary 83  70 - 99 mg/dL   Comment 1 Notify RN     Comment 2 Documented in Chart    GLUCOSE, CAPILLARY     Status: None   Collection Time    06/24/13  8:08 PM      Result Value Range   Glucose-Capillary 79  70 - 99 mg/dL   Comment 1 Documented in Chart     Comment 2 Notify RN    GLUCOSE, CAPILLARY     Status: None   Collection Time    06/25/13  1:31 AM      Result Value Range   Glucose-Capillary 75  70 - 99 mg/dL   Comment 1 Notify RN    BASIC METABOLIC PANEL     Status: Abnormal   Collection Time    06/25/13  5:00 AM      Result Value Range   Sodium 129 (*) 135 - 145 mEq/L   Potassium 4.0  3.5 - 5.1 mEq/L   Chloride 100  96 - 112 mEq/L   CO2 18 (*) 19 - 32 mEq/L   Glucose, Bld 106 (*) 70 - 99 mg/dL   BUN 33 (*) 6 - 23 mg/dL   Creatinine, Ser 3.05 (*) 0.50 - 1.10 mg/dL   Calcium 7.2 (*) 8.4 - 10.5 mg/dL   GFR calc non Af Amer 19 (*) >90 mL/min   GFR calc Af Amer 22 (*) >90 mL/min   Comment: (NOTE)     The eGFR has been calculated using the CKD EPI equation.     This calculation has not been validated in all clinical situations.     eGFR's persistently <90 mL/min signify possible Chronic Kidney     Disease.  CBC     Status: Abnormal   Collection Time    06/25/13  5:00 AM      Result Value Range   WBC 20.2 (*) 4.0 - 10.5 K/uL   RBC 3.49 (*) 3.87 - 5.11 MIL/uL   Hemoglobin 8.9 (*) 12.0 - 15.0 g/dL   HCT 26.7 (*) 36.0 - 46.0 %   MCV 76.5 (*) 78.0 - 100.0 fL   MCH 25.5 (*) 26.0 - 34.0 pg   MCHC 33.3  30.0 - 36.0 g/dL   RDW 16.8 (*) 11.5 - 15.5 %   Platelets 292  150 - 400 K/uL  GLUCOSE, CAPILLARY     Status: Abnormal   Collection Time    06/25/13  5:32 AM      Result Value Range    Glucose-Capillary 106 (*) 70 - 99 mg/dL  GLUCOSE, CAPILLARY     Status: None   Collection Time    06/25/13 12:28 PM      Result Value Range   Glucose-Capillary 82  70 - 99  mg/dL   Comment 1 Notify RN     Comment 2 Documented in Chart    GLUCOSE, CAPILLARY     Status: None   Collection Time    06/25/13  4:38 PM      Result Value Range   Glucose-Capillary 90  70 - 99 mg/dL   Comment 1 Documented in Chart     Comment 2 Notify RN    GLUCOSE, CAPILLARY     Status: None   Collection Time    06/25/13  8:53 PM      Result Value Range   Glucose-Capillary 85  70 - 99 mg/dL   Comment 1 Notify RN    GLUCOSE, CAPILLARY     Status: None   Collection Time    06/26/13  5:57 AM      Result Value Range   Glucose-Capillary 77  70 - 99 mg/dL   No results found.  Post Admission Physician Evaluation: 1. Functional deficits secondary  to left BKA. 2. Patient is admitted to receive collaborative, interdisciplinary care between the physiatrist, rehab nursing staff, and therapy team. 3. Patient's level of medical complexity and substantial therapy needs in context of that medical necessity cannot be provided at a lesser intensity of care such as a SNF. 4. Patient has experienced substantial functional loss from his/her baseline which was documented above under the "Functional History" and "Functional Status" headings.  Judging by the patient's diagnosis, physical exam, and functional history, the patient has potential for functional progress which will result in measurable gains while on inpatient rehab.  These gains will be of substantial and practical use upon discharge  in facilitating mobility and self-care at the household level. 5. Physiatrist will provide 24 hour management of medical needs as well as oversight of the therapy plan/treatment and provide guidance as appropriate regarding the interaction of the two. 6. 24 hour rehab nursing will assist with bladder management, bowel management, safety,  skin/wound care, disease management, medication administration, pain management and patient education  and help integrate therapy concepts, techniques,education, etc. 7. PT will assess and treat for/with: Lower extremity strength, range of motion, stamina, balance, functional mobility, safety, adaptive techniques and equipment, pre-pros education, pain mgt, education.   Goals are: mod I. 8. OT will assess and treat for/with: ADL's, functional mobility, safety, upper extremity strength, adaptive techniques and equipment, pain mgt, pre-pros ed.   Goals are: mod I. 9. SLP will assess and treat for/with: n/a.  Goals are: n/a. 10. Case Management and Social Worker will assess and treat for psychological issues and discharge planning. 11. Team conference will be held weekly to assess progress toward goals and to determine barriers to discharge. 12. Patient will receive at least 3 hours of therapy per day at least 5 days per week. 13. ELOS: 7-10 days       14. Prognosis:  excellent   Medical Problem List and Plan: 1. 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 DVT Prophylaxis/Anticoagulation: Subcutaneous heparin. Monitor platelet counts and any signs of bleeding 3. Pain Management: Oxycodone as needed. Monitor with increased mobility 4. Neuropsych: This patient is capable of making decisions on her own behalf. 5. Chronic anemia. Latest hemoglobin 8.9. Followup CBC. Latest hemoglobin prior to admission 8.6 6. Acute CHF/bronchitis. Lasix 80 mg twice daily.  No complaints of shortness of breath. Followup chest x-ray. Strict I and O.'s 7. Diabetes mellitus with peripheral neuropathy. Hemoglobin A1c 6.6. Presently with sliding scale insulin. Patient on Lantus insulin 18 units each bedtime  prior to admission. Check blood sugars a.c. and at bedtime and resume scheduled insulin as tolerated 8. Hypertension: Coreg 12.5 mg twice a day, Bidil 0.5 mg twice a day. Monitor with increased mobility 9. Chronic  renal insufficiency. Baseline creatinine 3.58. Strict I and O.'s. Followup chemistries 10. GERD. Protonix  [name], MD, [place] [facility]  06/26/2013

## 2013-06-26 NOTE — Consult Note (Signed)
Physical Medicine and Rehabilitation Consult Reason for Consult: Left BKA Referring Physician: [facility]   HPI: Karen [name] is a 37 y.o. right-handed female with history of diabetes mellitus with peripheral neuropathy as well as amputation left great toe 02/06/2013. Admitted 06/20/2013 with left forefoot pain and drainage of wound as well as low-grade fever. Patient also noted increased cough and shortness of breath. CT of the chest showed moderate bilateral pleural effusions/acute CHF. Patient placed on intravenous Lasix therapy for diuresis. Echocardiogram with ejection fraction [id number] grade 3 diastolic dysfunction. Venous Doppler studies lower extremity showed no evidence of DVT. Noted creatinine 3.06 on admission with baseline 3.58 and monitored. MRI of left foot showed osteomyelitis distal third and fourth metatarsals. 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 Doran Durand. Postoperative pain management. Subcutaneous heparin added for DVT prophylaxis. Physical therapy evaluation completed 06/25/2013 with recommendations of physical medicine rehabilitation consult to consider inpatient rehabilitation services.   Review of Systems  Respiratory: Positive for shortness of breath.   Cardiovascular: Positive for leg swelling.  Gastrointestinal:       GERD  Musculoskeletal: Positive for myalgias and joint pain.  All other systems reviewed and are negative.   Past Medical History  Diagnosis Date  . Hypertension   . Anemia   . History of blood transfusion     "last week" (02/02/2013)  . GERD (gastroesophageal reflux disease)   . Osteomyelitis of toe of left foot     "off and on since 2009; no OR" (02/02/2013)  . Renal failure     acute vs chronic/notes 02/02/2013  . Type II diabetes mellitus 1995  . Exertional shortness of breath     "recently; it's fluid" (02/02/2013)   Past Surgical History  Procedure Laterality Date  . Cesarean section  10/18/2006   . Amputation Left 02/06/2013    Procedure: AMPUTATION LEFT GREAT TOE;  Surgeon: Wylene Simmer, MD;  Location: [facility];  Service: Orthopedics;  Laterality: Left;   History reviewed. No pertinent family history. Social History:  reports that she quit smoking about 8 years ago. Her smoking use included Cigarettes. She has a 30 pack-year smoking history. She has never used smokeless tobacco. She reports that she does not drink alcohol or use illicit drugs. Allergies:  Allergies  Allergen Reactions  . Morphine And Related Rash  . Penicillins Rash   Medications Prior to Admission  Medication Sig Dispense Refill  . calcium carbonate (TUMS - DOSED IN MG ELEMENTAL CALCIUM) 500 MG chewable tablet Chew 1 tablet by mouth as needed for heartburn.      . carvedilol (COREG) 6.25 MG tablet Take 1 tablet (6.25 mg total) by mouth 2 (two) times daily with a meal.  60 tablet  1  . esomeprazole (NEXIUM) 40 MG capsule Take 40 mg by mouth daily before breakfast.      . furosemide (LASIX) 40 MG tablet Take 40 mg by mouth daily.      . hydrochlorothiazide (HYDRODIURIL) 25 MG tablet Take 25 mg by mouth 2 (two) times daily.      . insulin glargine (LANTUS) 100 UNIT/ML injection Inject 18 Units into the skin at bedtime.       Marland Kitchen oxyCODONE-acetaminophen (PERCOCET) 10-325 MG per tablet Take 1 tablet by mouth every 6 (six) hours as needed for pain (leg/back pain).      . triamcinolone (KENALOG) 0.025 % cream Apply 1 application topically 3 (three) times daily. Apply to entire body for itching      . [  DISCONTINUED] calcium carbonate (TUMS - DOSED IN MG ELEMENTAL CALCIUM) 500 MG chewable tablet Chew 1 tablet (200 mg of elemental calcium total) by mouth daily with breakfast.  30 tablet  1  . [DISCONTINUED] clindamycin (CLEOCIN) 300 MG capsule Take 1 capsule (300 mg total) by mouth every 6 (six) hours.  28 capsule  0  . [DISCONTINUED] Nutritional Supplements (FEEDING SUPPLEMENT, NEPRO CARB STEADY,) LIQD Take 237 mLs by mouth 2  (two) times daily between meals.    0    Home: Home Living Family/patient expects to be discharged to:: Private residence Living Arrangements: Children (46 yo twins, 35 yo; other non specific family 'in and out') Available Help at Discharge: Family;Personal care attendant;Available 24 hours/day (per pt) Type of Home: House Home Access: Stairs to enter [employer] of Steps: 5 Home Layout: One level Home Equipment: None Additional Comments: states landlord resistant to having ramp put in for access to home.  Says "I know we gonna need a plan with the kids and all"  Functional History:   Functional Status:  Mobility: Bed Mobility Bed Mobility: Supine to Sit;Sit to Supine;Scooting to [facility];Sitting - Scoot to Edge of Bed Supine to Sit: HOB elevated;With rails;3: Mod assist Sitting - Scoot to Edge of Bed: 4: Min assist;With rail Sit to Supine: 4: Min assist;With rail;HOB flat Scooting to HOB: 3: Mod assist (with bed pad) Transfers Transfers: Not assessed Ambulation/Gait Ambulation/Gait Assistance: Not tested (comment) Stairs: No Wheelchair Mobility Wheelchair Mobility: No  ADL:    Cognition: Cognition Overall Cognitive Status: Within Functional Limits for tasks assessed Orientation Level: Oriented X4 Cognition Arousal/Alertness: Suspect due to medications;Lethargic Behavior During Therapy: WFL for tasks assessed/performed Overall Cognitive Status: Within Functional Limits for tasks assessed  Blood pressure 137/86, pulse 94, temperature 97.5 F (36.4 C), temperature source Oral, resp. rate 18, height 5\' 6"  (1.676 m), weight 103.6 kg (228 lb 6.3 oz), last menstrual period 05/22/2013, SpO2 100.00%. Physical Exam  Vitals reviewed. Constitutional: She is oriented to person, place, and time. She appears well-developed and well-nourished.  HENT:  Head: Normocephalic and atraumatic.  Right Ear: External ear normal.  Left Ear: External ear normal.  Eyes: Conjunctivae and  EOM are normal. Pupils are equal, round, and reactive to light.  Neck: Normal range of motion. Neck supple. No JVD present. No tracheal deviation present. No thyromegaly present.  Cardiovascular: Normal rate and regular rhythm.   No murmur heard. Pulmonary/Chest: No respiratory distress. She has no wheezes. She has no rales.  Decreased breath sounds at the bases but clear to auscultation.  Abdominal: Soft. Bowel sounds are normal. She exhibits no distension.  Musculoskeletal:  Left leg, dressed, expectedly tender to touch and ROM.  Lymphadenopathy:    She has no cervical adenopathy.  Neurological: She is alert and oriented to person, place, and time. No cranial nerve deficit. Coordination normal.  Patient follows commands. UE strenght 5/5.RLE is 4/5. LLE HF could not lift agst gravity. Decreased PP,LT RLE.  Skin:  Left BKA site is dressed and appropriately tender  Psychiatric: She has a normal mood and affect. Her behavior is normal. Thought content normal.    Results for orders placed during the hospital encounter of 06/20/13 (from the past 24 hour(s))  GLUCOSE, CAPILLARY     Status: None   Collection Time    06/25/13 12:28 PM      Result Value Range   Glucose-Capillary 82  70 - 99 mg/dL   Comment 1 Notify RN     Comment 2  Documented in Chart    GLUCOSE, CAPILLARY     Status: None   Collection Time    06/25/13  4:38 PM      Result Value Range   Glucose-Capillary 90  70 - 99 mg/dL   Comment 1 Documented in Chart     Comment 2 Notify RN    GLUCOSE, CAPILLARY     Status: None   Collection Time    06/25/13  8:53 PM      Result Value Range   Glucose-Capillary 85  70 - 99 mg/dL   Comment 1 Notify RN     No results found.  Assessment/Plan: Diagnosis: left BKA 1. Does the need for close, 24 hr/day medical supervision in concert with the patient's rehab needs make it unreasonable for this patient to be served in a less intensive setting? Yes 2. Co-Morbidities requiring  supervision/potential complications: ckd, CM, DM with DPN 3. Due to bladder management, bowel management, safety, skin/wound care, disease management, medication administration, pain management and patient education, does the patient require 24 hr/day rehab nursing? Yes 4. Does the patient require coordinated care of a physician, rehab nurse, PT (1-2 hrs/day, 5 days/week) and OT (1-2 hrs/day, 5 days/week) to address physical and functional deficits in the context of the above medical diagnosis(es)? Yes Addressing deficits in the following areas: balance, endurance, locomotion, strength, transferring, bowel/bladder control, bathing, dressing, feeding, grooming, toileting and psychosocial support 5. Can the patient actively participate in an intensive therapy program of at least 3 hrs of therapy per day at least 5 days per week? Yes 6. The potential for patient to make measurable gains while on inpatient rehab is excellent 7. Anticipated functional outcomes upon discharge from inpatient rehab are mod I with PT, mod I with OT, n/a with SLP. 8. Estimated rehab length of stay to reach the above functional goals is: 7-10 days 9. Does the patient have adequate social supports to accommodate these discharge functional goals? Yes 10. Anticipated D/C setting: Home 11. Anticipated post D/C treatments: [facility] therapy 12. Overall Rehab/Functional Prognosis: excellent  RECOMMENDATIONS: This patient's condition is appropriate for continued rehabilitative care in the following setting: CIR Patient has agreed to participate in recommended program. Yes Note that insurance prior authorization may be required for reimbursement for recommended care.  Comment: Rehab RN to follow up. Has 4 STE house which will be a challenge.   [name], MD, Mellody Drown     06/26/2013

## 2013-06-27 ENCOUNTER — Encounter (HOSPITAL_COMMUNITY): Payer: Self-pay | Admitting: Orthopedic Surgery

## 2013-06-27 DIAGNOSIS — E1149 Type 2 diabetes mellitus with other diabetic neurological complication: Secondary | ICD-10-CM

## 2013-06-27 DIAGNOSIS — E1142 Type 2 diabetes mellitus with diabetic polyneuropathy: Secondary | ICD-10-CM

## 2013-06-27 LAB — GLUCOSE, CAPILLARY
Glucose-Capillary: 139 mg/dL — ABNORMAL HIGH (ref 70–99)
Glucose-Capillary: 144 mg/dL — ABNORMAL HIGH (ref 70–99)
Glucose-Capillary: 146 mg/dL — ABNORMAL HIGH (ref 70–99)

## 2013-06-27 LAB — CBC
HCT: 29.4 % — ABNORMAL LOW (ref 36.0–46.0)
Hemoglobin: 9.3 g/dL — ABNORMAL LOW (ref 12.0–15.0)
MCH: 24.8 pg — ABNORMAL LOW (ref 26.0–34.0)
MCV: 78.4 fL (ref 78.0–100.0)
Platelets: 312 10*3/uL (ref 150–400)
RDW: 17.4 % — ABNORMAL HIGH (ref 11.5–15.5)
WBC: 11.9 10*3/uL — ABNORMAL HIGH (ref 4.0–10.5)

## 2013-06-27 LAB — BASIC METABOLIC PANEL
BUN: 37 mg/dL — ABNORMAL HIGH (ref 6–23)
CO2: 18 mEq/L — ABNORMAL LOW (ref 19–32)
Calcium: 7.6 mg/dL — ABNORMAL LOW (ref 8.4–10.5)
Chloride: 103 mEq/L (ref 96–112)
Creatinine, Ser: 2.83 mg/dL — ABNORMAL HIGH (ref 0.50–1.10)
GFR calc Af Amer: 24 mL/min — ABNORMAL LOW (ref >90)
Glucose, Bld: 103 mg/dL — ABNORMAL HIGH (ref 70–99)

## 2013-06-27 MED ORDER — FUROSEMIDE 10 MG/ML IJ SOLN
80.0000 mg | Freq: Two times a day (BID) | INTRAMUSCULAR | Status: DC
  Administered 2013-06-27 – 2013-06-30 (×7): 80 mg via INTRAVENOUS
  Filled 2013-06-27 (×7): qty 8

## 2013-06-27 MED ORDER — SODIUM CHLORIDE 0.9 % IJ SOLN
10.0000 mL | INTRAMUSCULAR | Status: DC | PRN
  Administered 2013-06-27 – 2013-06-29 (×2): 10 mL
  Administered 2013-06-29: 22:00:00 20 mL
  Administered 2013-06-29: 10 mL
  Administered 2013-06-30: 09:00:00 20 mL

## 2013-06-27 NOTE — Progress Notes (Signed)
[facility] PROGRESS NOTE Interim History: 37 y.o. female who presents to the ED at [facility] hospital with c/o SOB. Symptoms have been going on for 2-3 days now, but she has had some SOB and cough for several months. Cough is productive of a clear frothy sputum. Associated with pedal edema increasing over the last few months. Associated with orthopnea. Diureses aggressively with IV lasix, cleared for amputation when into surgery with 98 kg, came back with  105.0 kg, change back to IV lasix. Filed Weights   06/25/13 0406 06/26/13 0621 06/27/13 0535  Weight: 103.6 kg (228 lb 6.3 oz) 105.5 kg (232 lb 9.4 oz) 103.8 kg (228 lb 13.4 oz)   BNP    Component Value Date/Time   PROBNP 55261.0* 06/22/2013 0500    Assessment/Plan: Left foot wound/fevers: - S/p amputation of the left 4th toe. Re-intervin 10.4.2014 with BKA. - Consulted orthopedics, MRI showed osteomyelitis and septic arthritis. , start vanc and zosyn 10.3.2014-10 4.2014. CIR consult. -  Completed azithromycin course.  Acute respiratory failure/Acute combined systolic and diastolic congestive heart failure/ F 45-50% with inf WMA by 2D 02/05/13/ Acute bronchitis: - Cont IV lasix at a higher dose. Electrolytes stable. - +JVD, had fluids running at through the night now in back in heart failure. - weight is up by 4 kg.   CKD (chronic kidney disease) stage 3, GFR 30-59 ml/min due to DM -  Cr 3.0, I believes her baseline is closer to 3.0 - Cont. IV lasix.  Essential hypertension - Improved control on bidil tachycardia resolved - Held bidil.  Type II or unspecified type diabetes mellitus with unspecified complication, uncontrolled - cont lantus plus SSI.    Code Status: full Family Communication: none  Disposition Plan: inpatinet   Consultants:  none  Procedures: ECHO: 10.01.2014  Antibiotics: Change to vanc and zosyn 10.3.2014 10.4.2014 HPI/Subjective: No compalins  Objective: BP 132/89  Pulse 89   Temp(Src) 98.1 F (36.7 C) (Oral)  Resp 18  Ht 5\' 6"  (1.676 m)  Wt 103.8 kg (228 lb 13.4 oz)  BMI 36.95 kg/m2  SpO2 99%  LMP 05/22/2013   Exam:  General: Alert, awake, oriented x3, in no acute distress.  HEENT: No bruits, no goiter. + JVD. Heart: Regular rate and rhythm, without murmurs, rubs, gallops.  Lungs: Good air movement, clear to auscultation.  Abdomen: Soft, nontender, nondistended, positive bowel sounds.    Data Reviewed: Basic Metabolic Panel:  Recent Labs Lab 06/21/13 0620  NA 132*  K 4.3  CL 102  CO2 17*  GLUCOSE 69*  BUN 18  CREATININE 2.10*  CALCIUM 8.1*  MG 1.7   Liver Function Tests: No results found for this basename: AST, ALT, ALKPHOS, BILITOT, PROT, ALBUMIN,  in the last 168 hours No results found for this basename: LIPASE, AMYLASE,  in the last 168 hours No results found for this basename: AMMONIA,  in the last 168 hours CBC:  Recent Labs Lab 06/21/13 0620  WBC PENDING  NEUTROABS PENDING  HGB 6.9*  HCT 21.9*  MCV 75.3*  PLT PENDING   Cardiac Enzymes:  Recent Labs Lab 06/21/13 0009 06/21/13 0620  TROPONINI <0.30 <0.30   BNP (last 3 results)  Recent Labs  02/07/13 0535  PROBNP 16430.0*   CBG:  Recent Labs Lab 06/20/13 2115 06/21/13 0704  GLUCAP 84 76    No results found for this or any previous visit (from the past 240 hour(s)).   Studies: No results found.  Scheduled Meds: . carvedilol  6.25  mg Oral BID WC  . furosemide  40 mg Intravenous Q6H  . heparin  5,000 Units Subcutaneous Q8H  . insulin aspart  0-9 Units Subcutaneous TID WC  . insulin glargine  9 Units Subcutaneous QHS  . pantoprazole  80 mg Oral Q1200  . sodium chloride  3 mL Intravenous Q12H   Continuous Infusions:    Charlynne Cousins  [facility] Pager [phone]. If 8PM-8AM, please contact night-coverage at [email or web address], password [facility] 06/21/2013, 9:44 AM  LOS: 1 day

## 2013-06-27 NOTE — Progress Notes (Signed)
Pt with order from C. Fowlers, PA for outside vendor brace.  Contacted Elberta Fortis ortho tech informing him about the order and instructed it comes from outside and they will order it tomorrow.  Karie Kirks, Therapist, sports.

## 2013-06-27 NOTE — Progress Notes (Signed)
Utilization Review Completed.   Dexton Zwilling, RN, BSN Nurse Case Manager  [phone]  

## 2013-06-27 NOTE — Progress Notes (Signed)
Pt wanted to know if she will be d/c to in-patient rehab to day.  Notified Dr. Durel Salts and instructed that she was not going today.  Pt was made aware.  Karie Kirks, RN

## 2013-06-27 NOTE — Progress Notes (Signed)
Subjective: 3 Days Post-Op Procedure(s) (LRB): AMPUTATION BELOW KNEE  (Left) Patient reports pain as 10 on 0-10 scale.  Pt c/o pain and pruritis under dressing.  Denies N/V/F/C, SOB, chest pain, paresthesias and change in appetite.  Objective: Vital signs in last 24 hours: Temp:  [97.5 F (36.4 C)-98.1 F (36.7 C)] 97.5 F (36.4 C) (10/07 1347) Pulse Rate:  [81-102] 81 (10/07 1347) Resp:  [18-19] 19 (10/07 1347) BP: (132-144)/(81-95) 133/81 mmHg (10/07 1347) SpO2:  [98 %-100 %] 100 % (10/07 1347) Weight:  [103.8 kg (228 lb 13.4 oz)] 103.8 kg (228 lb 13.4 oz) (10/07 0535)  Intake/Output from previous day: 10/06 0701 - 10/07 0700 In: 960 [P.O.:960] Out: 2700 [Urine:2700] Intake/Output this shift: Total I/O In: 580 [P.O.:580] Out: 1650 [Urine:1650]   Recent Labs  06/25/13 0500 06/26/13 1058 06/27/13 0550  HGB 8.9* 10.1* 9.3*    Recent Labs  06/26/13 1058 06/27/13 0550  WBC 14.8* 11.9*  RBC 3.99 3.75*  HCT 30.8* 29.4*  PLT 348 312    Recent Labs  06/26/13 1058 06/27/13 0550  NA 129* 131*  K 4.4 4.6  CL 100 103  CO2 16* 18*  BUN 34* 37*  CREATININE 2.86* 2.83*  GLUCOSE 110* 103*  CALCIUM 7.4* 7.6*   No results found for this basename: LABPT, INR,  in the last 72 hours  WD, WN female, NAD, A/Ox3, appears stated age.  EOMI, respirations unlabored, mood and affect normal.  Dressing taken down, incision C/D/I, normal amount of post edema, no draiange or erythema noted.  +TTP to distal stump during removal and reapplication of dressing.  Incision is well approximated with staples in good placement and no dehiscence noted.  No evidence of infection noted.  Distal stump has normal sensation to light touch and is NVI.  Pt received normal dose of pain medication before physical exam to assist with pain control.  Assessment/Plan: 3 Days Post-Op Procedure(s) (LRB): AMPUTATION BELOW KNEE  (Left) Had detailed discussion with pt regarding further care during post  operative period.  Pt is awaiting placement for rehab.  Stump shrinker sock has been ordered and will replace ace wrap on dressing.  Pt will f/u with Dr. Doran Durand as scheduled for evaluation of incision and stump.   FLOWERS, CHRISTOPHER S 06/27/2013, 6:20 PM

## 2013-06-27 NOTE — Progress Notes (Addendum)
Pt requesting to have wound consult for dry spot to her Rt hand and Rt toes as the trimcinolone cream is not working. Dr. Durel Salts made aware. Will continue to monitor.  Karie Kirks, Therapist, sports.

## 2013-06-27 NOTE — Progress Notes (Addendum)
Physical Therapy Treatment Patient Details Name: Karen [name] MRN: [id number] DOB: [birth year] Today's Date: 06/27/2013 Time: [id number] PT Time Calculation (min): 24 min  PT Assessment / Plan / Recommendation  History of Present Illness Pt is s/p left BKA.     PT Comments   Pt admitted with above. Pt currently with functional limitations due to balance and endurance deficits. Pt will benefit from skilled PT to increase their independence and safety with mobility to allow discharge to the venue listed below.    Follow Up Recommendations  CIR                 Equipment Recommendations  Wheelchair (measurements PT);Wheelchair cushion (measurements PT);Rolling walker with 5" wheels;3in1 (PT) (drop arm wheelchair with amputee pad)    Recommendations for Other Services Rehab consult  Frequency Min 3X/week   Progress towards PT Goals Progress towards PT goals: Progressing toward goals  Plan Current plan remains appropriate    Precautions / Restrictions Precautions Precautions: Fall Precaution Comments: avoid prolonged knee or hip flexion surgical side, KI in place this eval Required Braces or Orthoses: Knee Immobilizer - Left Knee Immobilizer - Left: Other (comment) (no specification in chart) Restrictions Weight Bearing Restrictions: Yes LLE Weight Bearing: Non weight bearing Other Position/Activity Restrictions: elevate LEFT extremity, but do not place pillow behind knee.  Lay on side or prone if able to stretch hip extensor   Pertinent Vitals/Pain VSS, No pain    Mobility  Bed Mobility Bed Mobility: Supine to Sit;Sitting - Scoot to Edge of Bed Supine to Sit: 5: Supervision;With rails;HOB elevated Sitting - Scoot to Edge of Bed: 5: Supervision;With rail Sit to Supine: Not Tested (comment) Scooting to [facility]: Not tested (comment) Transfers Transfers: Sit to Stand;Stand to Sit;Stand Pivot Transfers Sit to Stand: 1: +2 Total assist;With upper extremity assist;From bed;From elevated  surface Sit to Stand: Patient Percentage: 50% Stand to Sit: 1: +2 Total assist;With upper extremity assist;To chair/3-in-1;With armrests Stand to Sit: Patient Percentage: 50% Stand Pivot Transfers: Not tested (comment) Details for Transfer Assistance: Pt continues to need max cues for hand placement.  Needed alot of assist to achieve upright balance with pt moving right LE as she stood making it more difficult.  Once up, pt needed cues to rely on RW as she was almost holding it up.  Once pt began to use the Rw correctly, she took about 5 steps with RW.  Needed constant cues for postural stability.   Ambulation/Gait Ambulation/Gait Assistance: 1: +2 Total assist Ambulation/Gait: Patient Percentage: 70% Ambulation Distance (Feet): 5 Feet Assistive device: Rolling walker Ambulation/Gait Assistance Details: Pt able to ambulate 5 feet with the RW.  Able to move the RW forward as well.  Pt using UEs as well to gain pushing power to step.  [place] chair behind pt.   Gait Pattern: Step-to pattern;Decreased stride length;Trunk flexed;Wide base of support Gait velocity: decreased Stairs: No Wheelchair Mobility Wheelchair Mobility: No  Exercises:  Quad sets bil and SLR bil x15 reps.   PT Goals (current goals can now be found in the care plan section)    Visit Information  Last PT Received On: 06/27/13 Assistance Needed: +2 History of Present Illness: Pt is s/p left BKA.      Subjective Data  Subjective: "I think I am getting the hang of standing up finally."   Cognition  Cognition Arousal/Alertness: Awake/alert Behavior During Therapy: WFL for tasks assessed/performed Overall Cognitive Status: Within Functional Limits for tasks assessed (processes information slowly)  Balance  Static Sitting Balance Static Sitting - Balance Support: Bilateral upper extremity supported Static Sitting - Level of Assistance: 5: Stand by assistance Static Sitting - Comment/# of Minutes: 15 Dynamic  Standing Balance Dynamic Standing - Balance Support: Bilateral upper extremity supported;During functional activity Dynamic Standing - Level of Assistance: 1: +2 Total assist;Patient percentage (comment) (pt =40%) Dynamic Standing - Balance Activities: Forward lean/weight shifting;Lateral lean/weight shifting Dynamic Standing - Comments: Pt beginning to show that she can maintain balance statically and dynamically with RW.    End of Session PT - End of Session Equipment Utilized During Treatment: Gait belt Activity Tolerance: Patient limited by fatigue Patient left: in chair;with call bell/phone within reach Nurse Communication: Mobility status;Need for lift equipment        INGOLD,Satori Krabill 06/27/2013, 9:54 AM [facility] [phone] (pager)

## 2013-06-28 LAB — GLUCOSE, CAPILLARY
Glucose-Capillary: 120 mg/dL — ABNORMAL HIGH (ref 70–99)
Glucose-Capillary: 194 mg/dL — ABNORMAL HIGH (ref 70–99)

## 2013-06-28 MED ORDER — SODIUM BICARBONATE 650 MG PO TABS
650.0000 mg | ORAL_TABLET | Freq: Every day | ORAL | Status: DC
  Administered 2013-06-28: 650 mg via ORAL
  Filled 2013-06-28 (×3): qty 1

## 2013-06-28 MED ORDER — NEPRO/CARBSTEADY PO LIQD
237.0000 mL | Freq: Two times a day (BID) | ORAL | Status: DC
  Administered 2013-06-29 – 2013-06-30 (×3): 237 mL via ORAL
  Filled 2013-06-28 (×7): qty 237

## 2013-06-28 MED ORDER — GLUCERNA SHAKE PO LIQD: 237.0000 mL | Freq: Two times a day (BID) | ORAL | Status: DC | PRN

## 2013-06-28 NOTE — Plan of Care (Signed)
Problem: Phase I Progression Outcomes Goal: Up in chair, BRP Outcome: Progressing Pt OOB to chair with 2-3 person assist Goal: Initial discharge plan identified Outcome: Completed/Met Date Met:  06/28/13 Pt to d/c to rehab

## 2013-06-28 NOTE — Progress Notes (Signed)
Rehab admissions - continuing to follow.  Awaiting medical clearance prior to admission to inpatient rehab.  Call me for questions.  [phone]

## 2013-06-28 NOTE — Progress Notes (Signed)
Subjective: 4 Days Post-Op Procedure(s) (LRB): AMPUTATION BELOW KNEE  (Left) Patient reports pain as 1 on 0-10 scale.  Pt doing well this AM.  Pain well tolerated.  Pt denies N/V/F/C, chest pain, SOB, or changes in appetite.  Objective: Vital signs in last 24 hours: Temp:  [98.2 F (36.8 C)-98.5 F (36.9 C)] 98.2 F (36.8 C) (10/08 1330) Pulse Rate:  [87-99] 87 (10/08 1330) Resp:  [18-20] 20 (10/08 1330) BP: (137-150)/(88-92) 137/88 mmHg (10/08 1330) SpO2:  [99 %-100 %] 99 % (10/08 1330) Weight:  [104.9 kg (231 lb 4.2 oz)] 104.9 kg (231 lb 4.2 oz) (10/08 0612)  Intake/Output from previous day: 10/07 0701 - 10/08 0700 In: 1280 [P.O.:1280] Out: 2600 [Urine:2600] Intake/Output this shift: Total I/O In: 600 [P.O.:600] Out: 400 [Urine:400]   Recent Labs  06/26/13 1058 06/27/13 0550  HGB 10.1* 9.3*    Recent Labs  06/26/13 1058 06/27/13 0550  WBC 14.8* 11.9*  RBC 3.99 3.75*  HCT 30.8* 29.4*  PLT 348 312    Recent Labs  06/26/13 1058 06/27/13 0550  NA 129* 131*  K 4.4 4.6  CL 100 103  CO2 16* 18*  BUN 34* 37*  CREATININE 2.86* 2.83*  GLUCOSE 110* 103*  CALCIUM 7.4* 7.6*   No results found for this basename: LABPT, INR,  in the last 72 hours  WD WN female NAD, a/ox3, appears stated age.  EOMI, respirations unlabored, mood and affect are normal.  dressing is cdi, good sensation to distal stump, proximal leg compartments soft, no evidence of infection noted  Assessment/Plan: 4 Days Post-Op Procedure(s) (LRB): AMPUTATION BELOW KNEE  (Left) Up with therapy Pt awaiting placement to inpt rehab.  Pt to have stump shrinker sock placed.  Will f/u with Dr. Doran Durand as scheduled.  Tyrian Peart S 06/28/2013, 5:34 PM

## 2013-06-28 NOTE — Progress Notes (Signed)
NUTRITION FOLLOW UP  Intervention:   Continue Glucerna BID PRN Encourage PO intake >75% of meals with protein-rich foods  Nutrition Dx:   Inadequate oral intake related to poor appetite/nausea as evidenced by pt's report of eating 1 meal daily for past 9 months and 13% wt loss in less than 5 months; discontinued gaining weight and adequate PO intake  Goal:   Pt to meet >/= 90% of their estimated nutrition needs; being met  Monitor:   PO intake; 75-100% of meals, Glucerna BID Weight trend; 12 lb wt gain in past week Labs; low hemoglobin, low sodium, low calcium, high BUN  Assessment:   37 y.o. female who presents to the ED at [place] with c/o SOB. Symptoms have been going on for 2-3 days now, but she has had some SOB and cough for several months. In ED she was found to be in CHF, given lasix and transferred to [facility]. Pt has history of hypertension, GERD, renal failure, and type 2 diabetes.   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 Pt states she feels better and denies any more nausea. She reports her appetite has improved, she is eating >75% of 3 meals daily and drinking Glucerna Shakes daily. Pt states she will be going to inpatient rehab. Reviewed protein-rich foods and encouraged daily intake. Encouraged PO intake >75% of meals.   Height: Ht Readings from Last 1 Encounters:  06/20/13 5\' 6"  (1.676 m)    Weight Status:   Wt Readings from Last 1 Encounters:  06/28/13 231 lb 4.2 oz (104.9 kg)    Re-estimated needs:  Kcal: 1900-2100  Protein: 80-90 grams  Fluid: 1.5 L/day fluid restriction   Skin: amputation below right knee; +1 LLE edema  Diet Order: Carb Control   Intake/Output Summary (Last 24 hours) at 06/28/13 1621 Last data filed at 06/28/13 1300  Gross per 24 hour  Intake   1080 ml  Output   1700 ml  Net   -620 ml    Last BM: 10/6   Labs:   Recent Labs Lab 06/25/13 0500 06/26/13 1058 06/27/13 0550  NA 129* 129* 131*  K 4.0 4.4 4.6  CL  100 100 103  CO2 18* 16* 18*  BUN 33* 34* 37*  CREATININE 3.05* 2.86* 2.83*  CALCIUM 7.2* 7.4* 7.6*  GLUCOSE 106* 110* 103*    CBG (last 3)   Recent Labs  06/27/13 2111 06/28/13 0605 06/28/13 1150  GLUCAP 146* 120* 132*    Scheduled Meds: . carvedilol  12.5 mg Oral BID WC  . docusate sodium  100 mg Oral BID  . feeding supplement (GLUCERNA SHAKE)  237 mL Oral BID BM  . furosemide  80 mg Intravenous BID  . heparin  5,000 Units Subcutaneous Q8H  . insulin aspart  0-9 Units Subcutaneous TID WC  . pantoprazole  40 mg Oral Q1200  . senna  2 tablet Oral BID  . sodium bicarbonate  650 mg Oral Daily  . triamcinolone  1 application Topical TID    Continuous Infusions: . sodium chloride 1,000 mL (06/25/13 1033)    Pryor Ochoa RD, LDN Inpatient Clinical Dietitian Pager: [phone] After Hours Pager: [phone]

## 2013-06-28 NOTE — Progress Notes (Signed)
[facility] PROGRESS NOTE Interim History: 37 y.o. female who presents to the ED at [facility] hospital with c/o SOB. Symptoms have been going on for 2-3 days now, but she has had some SOB and cough for several months. Cough is productive of a clear frothy sputum. Associated with pedal edema increasing over the last few months. Associated with orthopnea. Diureses aggressively with IV lasix, cleared for amputation when into surgery with 98 kg, came back with  105.0 kg, cpntinue IV lasix.  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 Electrolytes stable. -breathing better; still with significant edema; no JVD -IVF changed to NSL -close I's and O's; daily weight - weight is up by 4 kg.  CKD (chronic kidney disease) stage 3, GFR 30-59 ml/min due to DM - Cr 2.83 ( at baseline Cr fluctuates from 2.5-3.0) - Cont. IV lasix.  Protein calorie malnutrition and hypoalbuminemia: -started on NEPRO -will follow nutritional service rec's  Essential hypertension -stable. -will monitor especially with ongoing IV lasix  Type II or unspecified type diabetes mellitus with unspecified complication, uncontrolled - cont lantus plus SSI.    Code Status: full Family Communication: none  Disposition Plan: inpatinet   Consultants:  none  Procedures: ECHO: 10.01.2014  Antibiotics: Change to vanc and zosyn 10.3.2014 10.4.2014 HPI/Subjective: No compalins  Objective: BP 140/110  Pulse 102  Temp(Src) 98.4 F  (36.9 C) (Oral)  Resp 20  Ht 5\' 6"  (1.676 m)  Wt 104.9 kg (231 lb 4.2 oz)  BMI 37.34 kg/m2  SpO2 100%  LMP 05/22/2013   Exam:  General: Alert, awake, oriented x3, in no acute distress.  HEENT: No bruits, no goiter. no JVD. Heart: Regular rate and rhythm, without murmurs, rubs, gallops.  Lungs: Good air movement, clear to auscultation.  Abdomen: Soft, nontender, nondistended, positive bowel sounds.  EXT: left BKA; RLE with 2++ swelling.  Data Reviewed: Basic Metabolic Panel:  Recent Labs Lab 06/21/13 0620  NA 132*  K 4.3  CL 102  CO2 17*  GLUCOSE 69*  BUN 18  CREATININE 2.10*  CALCIUM 8.1*  MG 1.7   CBC:  Recent Labs Lab 06/21/13 0620  WBC PENDING  NEUTROABS PENDING  HGB 6.9*  HCT 21.9*  MCV 75.3*  PLT PENDING   Cardiac Enzymes:  Recent Labs Lab 06/21/13 0009 06/21/13 0620  TROPONINI <0.30 <0.30   BNP (last 3 results)  Recent Labs  02/07/13 0535  PROBNP 16430.0*   CBG:  Recent Labs Lab 06/20/13 2115 06/21/13 0704  GLUCAP 84 76    No results found for this or any previous visit (from the past 240 hour(s)).   Studies: No results found.  Scheduled Meds: . carvedilol  6.25 mg Oral BID WC  . furosemide  40 mg Intravenous Q6H  . heparin  5,000 Units Subcutaneous Q8H  . insulin aspart  0-9 Units Subcutaneous TID WC  . insulin glargine  9 Units Subcutaneous QHS  . pantoprazole  80 mg Oral Q1200  . sodium chloride  3 mL Intravenous Q12H   Continuous Infusions:    Akeel Reffner   [facility] Pager [phone]. If 8PM-8AM, please contact night-coverage at [email or web address], password [facility] 06/21/2013, 9:44 AM  LOS: 1 day

## 2013-06-28 NOTE — Progress Notes (Signed)
Orthopedic Tech Progress Note Patient Details:  Karen [name] 1976-09-15 [id number]  Patient ID: Karen [name], female   DOB: 1976-06-24, 37 y.o.   MRN: [id number]   Irish Elders 06/28/2013, 9:51 AM Called [employer] for left stump shrinker.

## 2013-06-29 LAB — GLUCOSE, CAPILLARY
Glucose-Capillary: 109 mg/dL — ABNORMAL HIGH (ref 70–99)
Glucose-Capillary: 122 mg/dL — ABNORMAL HIGH (ref 70–99)
Glucose-Capillary: 162 mg/dL — ABNORMAL HIGH (ref 70–99)
Glucose-Capillary: 166 mg/dL — ABNORMAL HIGH (ref 70–99)

## 2013-06-29 LAB — CULTURE, BLOOD (ROUTINE X 2): Culture: NO GROWTH

## 2013-06-29 LAB — BASIC METABOLIC PANEL
CO2: 19 mEq/L (ref 19–32)
Calcium: 7.9 mg/dL — ABNORMAL LOW (ref 8.4–10.5)
Creatinine, Ser: 2.12 mg/dL — ABNORMAL HIGH (ref 0.50–1.10)
Glucose, Bld: 121 mg/dL — ABNORMAL HIGH (ref 70–99)

## 2013-06-29 MED ORDER — ISOSORB DINITRATE-HYDRALAZINE 20-37.5 MG PO TABS
0.5000 | ORAL_TABLET | Freq: Two times a day (BID) | ORAL | Status: DC
  Administered 2013-06-29 – 2013-06-30 (×2): 0.5 via ORAL
  Filled 2013-06-29 (×4): qty 0.5

## 2013-06-29 MED ORDER — CALCIUM CARBONATE 1250 (500 CA) MG PO TABS
1.0000 | ORAL_TABLET | Freq: Two times a day (BID) | ORAL | Status: DC
  Administered 2013-06-30: 500 mg via ORAL
  Filled 2013-06-29 (×4): qty 1

## 2013-06-29 MED ORDER — ALTEPLASE 2 MG IJ SOLR
2.0000 mg | Freq: Once | INTRAMUSCULAR | Status: AC
  Administered 2013-06-29: 18:00:00 2 mg
  Filled 2013-06-29 (×2): qty 2

## 2013-06-29 MED ORDER — ALTEPLASE 2 MG IJ SOLR
2.0000 mg | Freq: Once | INTRAMUSCULAR | Status: DC
  Administered 2013-06-29: 2 mg

## 2013-06-29 NOTE — Progress Notes (Signed)
[facility] PROGRESS NOTE Interim History: 37 y.o. female who presents to the ED at [facility] hospital with c/o SOB. Symptoms have been going on for 2-3 days now, but she has had some SOB and cough for several months. Cough is productive of a clear frothy sputum. Associated with pedal edema increasing over the last few months. Associated with orthopnea. Diureses aggressively with IV lasix, cleared for amputation when into surgery with 98 kg, came back with  105.0 kg, continue IV lasix for one more day and switch to by mouth Lasix before allowing patient to go to inpatient rehabilitation.  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 Electrolytes and kidney function stable. -breathing better; still with significant edema (but overall improving); no JVD -IVF changed to NSL -close I's and O's; daily weight - weight is up by 3-4 kg.  CKD (chronic kidney disease) stage 3, GFR 30-59 ml/min due to DM - Cr 2.12 ( at baseline Cr fluctuates from 2.5-3.0) - Cont. IV lasix. -Continue calcium supplementation and also daily bicarbonate tablets  Protein calorie malnutrition and hypoalbuminemia: -started on NEPRO -will follow nutritional service rec's  Essential hypertension -stable. -will monitor especially with ongoing IV lasix -Continue BiDil  Type II or unspecified type diabetes mellitus with unspecified complication, uncontrolled - cont lantus plus SSI. -CBGs with good control range. Continue  modify carbohydrates diet  Hypocalcemia -Continue all skull twice a day    Code Status: full Family Communication: none  Disposition Plan: inpatinet   Consultants:  none  Procedures: ECHO: 10.01.2014  Antibiotics: Change to vanc and zosyn 10.3.2014 10.4.2014  HPI/Subjective: No compalins  Objective: BP 146/90  Pulse 100  Temp(Src) 98.4 F (36.9 C) (Oral)  Resp 18  Ht 5\' 6"  (1.676 m)  Wt 106 kg (233 lb 11 oz)  BMI 37.74 kg/m2  SpO2 97%  LMP 05/22/2013   Exam:  General: Alert, awake, oriented x3, in no acute distress.  HEENT: No bruits, no goiter. no JVD. Heart: Regular rate and rhythm, without murmurs, rubs, gallops.  Lungs: Good air movement, clear to auscultation.  Abdomen: Soft, nontender, nondistended, positive bowel sounds.  EXT: left BKA; RLE with 2++ swelling.  Data Reviewed: Basic Metabolic Panel: Sodium [id number] 135 - 145 mEq/L Final Potassium 5.2 (H) 3.5 - 5.1 mEq/L Final  Chloride 108 96 - 112 mEq/L Final  CO2 19 19 - 32 mEq/L Final  Glucose, Bld 121 (H) 70 - 99 mg/dL Final  BUN 37 (H) 6 - 23 mg/dL Final  Creatinine, Ser 2.12 (H) 0.50 - 1.10 mg/dL Final  Calcium 7.9 (L) 8.4 - 10.5 mg/dL Final  GFR calc non Af Amer 29 (L) >90 mL/min Final  GFR calc Af Amer 33 (L) >90 mL/min Final   CBC:  Recent Labs Lab 06/21/13 0620  WBC PENDING  NEUTROABS PENDING  HGB 6.9*  HCT 21.9*  MCV 75.3*  PLT PENDING   Cardiac Enzymes:  Recent Labs Lab 06/21/13 0009 06/21/13 0620  TROPONINI <0.30 <  0.30   BNP (last 3 results)  Recent Labs  02/07/13 0535  PROBNP 16430.0*   CBG:  Recent Labs Lab 06/20/13 2115 06/21/13 0704  GLUCAP 84 76    No results found for this or any previous visit (from the past 240 hour(s)).   Studies: No results found.  Scheduled Meds: . carvedilol  6.25 mg Oral BID WC  . furosemide  40 mg Intravenous Q6H  . heparin  5,000 Units Subcutaneous Q8H  . insulin aspart  0-9 Units Subcutaneous TID WC  . insulin  glargine  9 Units Subcutaneous QHS  . pantoprazole  80 mg Oral Q1200  . sodium chloride  3 mL Intravenous Q12H   Continuous Infusions:    Sae Handrich   [facility] Pager [phone]. If 8PM-8AM, please contact night-coverage at [email or web address], password [facility] 06/21/2013, 9:44 AM  LOS: 1 day

## 2013-06-29 NOTE — Progress Notes (Signed)
Physical Therapy Treatment Patient Details Name: Karen [name] MRN: [id number] DOB: September 03, 1976 Today's Date: 06/29/2013 Time: [id number] PT Time Calculation (min): 27 min  PT Assessment / Plan / Recommendation  History of Present Illness Pt is s/p left BKA.     PT Comments   Pt motivated to participate. Pt c/o pain with limb with standing, eases with rest.  Pt with limited mm endurance, requiring frequent seated rests during sit to stand training.  Follow Up Recommendations  CIR     Does the patient have the potential to tolerate intense rehabilitation     Barriers to Discharge        Equipment Recommendations       Recommendations for Other Services Rehab consult  Frequency Min 3X/week   Progress towards PT Goals Progress towards PT goals: Progressing toward goals  Plan Current plan remains appropriate    Precautions / Restrictions Precautions Precautions: Fall Required Braces or Orthoses: Knee Immobilizer - Left Restrictions Weight Bearing Restrictions: Yes LLE Weight Bearing: Non weight bearing Other Position/Activity Restrictions: elevate LEFT extremity, but do not place pillow behind knee.  Lay on side or prone if able to stretch hip extensor   Pertinent Vitals/Pain C/o pain when limb in dependent position, eases when out of position, RN made aware    Mobility  Bed Mobility Bed Mobility: Supine to Sit;Sitting - Scoot to [employer] of Bed;Sit to Supine;Scooting to [facility] Supine to Sit: 5: Supervision;With rails;HOB elevated Sitting - Scoot to Edge of Bed: 5: Supervision;With rail Sit to Supine: 4: Min assist Scooting to [facility]: 6: Modified independent (Device/Increase time) Details for Bed Mobility Assistance: Pt requires assist for R LE for sit to supine Transfers Sit to Stand: 3: Mod assist;From elevated surface Stand to Sit: 4: Min assist;To elevated surface Details for Transfer Assistance: Repetitions of sit to stand from various height surfaces.  Pt requires cues for UE  placement and lifting assist.  Able to stand with min-mod A from various heights of elevated bed.  Pt requiers cues for trunk extension, difficulty maintaining upright trunk posture during standing    Exercises General Exercises - Upper Extremity Shoulder Flexion: Strengthening;Both;15 reps;Supine;Theraband Theraband Level (Shoulder Flexion): Level 3 (Green) Shoulder Extension: Strengthening;Both;20 reps;Supine;Theraband Theraband Level (Shoulder Extension): Level 3 (Green) Shoulder Horizontal ABduction: Strengthening;Both;15 reps;Supine;Theraband Theraband Level (Shoulder Horizontal Abduction): Level 3 (Green) Elbow Flexion: Strengthening;15 reps;Supine;Theraband Theraband Level (Elbow Flexion): Level 3 (Green) Elbow Extension: Strengthening;15 reps;Supine;Theraband Theraband Level (Elbow Extension): Level 3 (Green)   PT Diagnosis:    PT Problem List:   PT Treatment Interventions:     PT Goals (current goals can now be found in the care plan section) Acute Rehab PT Goals Patient Stated Goal: to go home  Visit Information  Last PT Received On: 06/29/13 Assistance Needed: +1 History of Present Illness: Pt is s/p left BKA.      Subjective Data  Patient Stated Goal: to go home   Cognition  Cognition Arousal/Alertness: Awake/alert Behavior During Therapy: WFL for tasks assessed/performed Overall Cognitive Status: Within Functional Limits for tasks assessed    Balance  Static Sitting Balance Static Sitting - Balance Support: Feet supported Static Sitting - Level of Assistance: 5: Stand by assistance Static Sitting - Comment/# of Minutes: 10 Static Standing Balance Static Standing - Balance Support: Bilateral upper extremity supported Static Standing - Level of Assistance: 4: Min assist  End of Session PT - End of Session Equipment Utilized During Treatment: Gait belt Activity Tolerance: Patient tolerated treatment well Patient left: in bed;with  call bell/phone within  reach Nurse Communication: Patient requests pain meds   GP     Karen [name] 06/29/2013, 2:12 PM

## 2013-06-29 NOTE — Progress Notes (Signed)
Subjective: 5 Days Post-Op Procedure(s) (LRB): AMPUTATION BELOW KNEE  (Left) Patient reports pain as mild.  Some phantom limb sensation but no pain.  Awaiting rehab bed at [facility].  Objective: Vital signs in last 24 hours: Temp:  [98.4 F (36.9 C)-98.6 F (37 C)] 98.6 F (37 C) (10/09 1516) Pulse Rate:  [96-103] 96 (10/09 1516) Resp:  [18-20] 18 (10/09 1516) BP: (140-160)/(90-110) 148/94 mmHg (10/09 1516) SpO2:  [95 %-100 %] 98 % (10/09 1516) Weight:  [106 kg (233 lb 11 oz)] 106 kg (233 lb 11 oz) (10/09 0523)  Intake/Output from previous day: 10/08 0701 - 10/09 0700 In: 2149.3 [P.O.:1680; I.V.:469.3] Out: 1800 [Urine:1800] Intake/Output this shift: Total I/O In: 240 [P.O.:240] Out: 1050 [Urine:1050]   Recent Labs  06/27/13 0550  HGB 9.3*    Recent Labs  06/27/13 0550  WBC 11.9*  RBC 3.75*  HCT 29.4*  PLT 312    Recent Labs  06/27/13 0550 06/29/13 0555  NA 131* 136  K 4.6 5.2*  CL 103 108  CO2 18* 19  BUN 37* 37*  CREATININE 2.83* 2.12*  GLUCOSE 103* 121*  CALCIUM 7.6* 7.9*   No results found for this basename: LABPT, INR,  in the last 72 hours  PE:  L LE dressed and dry.  knee immobilizer in place.  Assessment/Plan: 5 Days Post-Op Procedure(s) (LRB): AMPUTATION BELOW KNEE  (Left) From ortho perspective, she can be d/c'd safely at any time.  I'd like to see her back in the office in 2-3 weeks.  She'll be NWB on the L LE.  I'll sign off.  Please call with any questions.  [phone].  Wylene Simmer 06/29/2013, 5:34 PM

## 2013-06-29 NOTE — Progress Notes (Signed)
Occupational Therapy Treatment Patient Details Name: Karen [name] MRN: [id number] DOB: Nov 18, 1975 Today's Date: 06/29/2013 Time: [id number] OT Time Calculation (min): 24 min  OT Assessment / Plan / Recommendation  History of present illness Pt is s/p left BKA.     OT comments  Much improved processing speed and less anxious.  Ready for intense rehab.  Focus today on bed mobility and EOB ADL unsupported.  Instructed and provided Level 3 theraband exercises.  Tolerated well.  Follow Up Recommendations  CIR    Barriers to Discharge       Equipment Recommendations  3 in 1 bedside comode;Tub/shower bench;Wheelchair (measurements OT);Wheelchair cushion (measurements OT)    Recommendations for Other Services    Frequency Min 2X/week   Progress towards OT Goals Progress towards OT goals: Progressing toward goals  Plan Discharge plan remains appropriate    Precautions / Restrictions Precautions Precautions: Fall Restrictions Weight Bearing Restrictions: Yes LLE Weight Bearing: Non weight bearing   Pertinent Vitals/Pain "it's all right" in regard to pain, repositioned, VSS    ADL  Grooming: Wash/dry hands;Teeth care;Wash/dry face;Set up Where Assessed - Grooming: Unsupported sitting Equipment Used:  (level 3 theraband) ADL Comments: Pt with much improved processing speed, less anxious. Wants to get to rehab as soon as possible.      OT Diagnosis:    OT Problem List:   OT Treatment Interventions:     OT Goals(current goals can now be found in the care plan section) Acute Rehab OT Goals Patient Stated Goal: to go home  Visit Information  Last OT Received On: 06/29/13 Assistance Needed: +2 History of Present Illness: Pt is s/p left BKA.      Subjective Data      Prior Functioning       Cognition  Cognition Arousal/Alertness: Awake/alert Behavior During Therapy: WFL for tasks assessed/performed Overall Cognitive Status: Within Functional Limits for tasks assessed     Mobility  Bed Mobility Bed Mobility: Supine to Sit;Sitting - Scoot to [employer] of Bed;Sit to Supine;Scooting to [facility] Supine to Sit: 5: Supervision;With rails;HOB elevated Sitting - Scoot to Edge of Bed: 5: Supervision;With rail Sit to Supine: 5: Supervision;HOB elevated Scooting to HOB: 6: Modified independent (Device/Increase time)    Exercises  General Exercises - Upper Extremity Shoulder Flexion: Strengthening;Both;15 reps;Supine;Theraband Theraband Level (Shoulder Flexion): Level 3 (Green) Shoulder Extension: Strengthening;Both;20 reps;Supine;Theraband Theraband Level (Shoulder Extension): Level 3 (Green) Shoulder Horizontal ABduction: Strengthening;Both;15 reps;Supine;Theraband Theraband Level (Shoulder Horizontal Abduction): Level 3 (Green) Elbow Flexion: Strengthening;15 reps;Supine;Theraband Theraband Level (Elbow Flexion): Level 3 (Green) Elbow Extension: Strengthening;15 reps;Supine;Theraband Theraband Level (Elbow Extension): Level 3 (Green)   Balance Static Sitting Balance Static Sitting - Balance Support: Feet supported Static Sitting - Level of Assistance: 5: Stand by assistance Static Sitting - Comment/# of Minutes: 10   End of Session OT - End of Session Activity Tolerance: Patient tolerated treatment well Patient left: in bed;with call bell/phone within reach  GO     Malka So 06/29/2013, 12:19 PM [phone]

## 2013-06-29 NOTE — Progress Notes (Signed)
Patient is refusing BP medication and states, "I am not about to mix medications, I will just take the one I have been taking." Will continue to monitor patient. Ruben Im RN

## 2013-06-29 NOTE — Progress Notes (Signed)
06/29/13 1043 Pt. still fluid overloaded, and will need at least 1 more day of diuresis.  Will dc to [facility] tomorrow.  Updated Genie, Admissions Coordinator with [facility].   Llana Aliment, RN, BSN NCM [phone]

## 2013-06-29 NOTE — Progress Notes (Signed)
Patient refused morning meds; will continue to monitor. Ruben Im RN

## 2013-06-30 ENCOUNTER — Encounter (HOSPITAL_COMMUNITY): Payer: Self-pay | Admitting: *Deleted

## 2013-06-30 ENCOUNTER — Inpatient Hospital Stay (HOSPITAL_COMMUNITY)
Admission: RE | Admit: 2013-06-30 | Discharge: 2013-07-13 | DRG: 945 | Disposition: A | Discharge status: Home Health | Payer: Medicaid Other | Source: Intra-hospital | Attending: Physical Medicine & Rehabilitation | Admitting: Physical Medicine & Rehabilitation

## 2013-06-30 DIAGNOSIS — Z89519 Acquired absence of unspecified leg below knee: Secondary | ICD-10-CM

## 2013-06-30 DIAGNOSIS — E1142 Type 2 diabetes mellitus with diabetic polyneuropathy: Secondary | ICD-10-CM | POA: Diagnosis present

## 2013-06-30 DIAGNOSIS — I129 Hypertensive chronic kidney disease with stage 1 through stage 4 chronic kidney disease, or unspecified chronic kidney disease: Secondary | ICD-10-CM | POA: Diagnosis present

## 2013-06-30 DIAGNOSIS — R5381 Other malaise: Secondary | ICD-10-CM

## 2013-06-30 DIAGNOSIS — E1165 Type 2 diabetes mellitus with hyperglycemia: Secondary | ICD-10-CM

## 2013-06-30 DIAGNOSIS — L98499 Non-pressure chronic ulcer of skin of other sites with unspecified severity: Secondary | ICD-10-CM

## 2013-06-30 DIAGNOSIS — Z5189 Encounter for other specified aftercare: Principal | ICD-10-CM

## 2013-06-30 DIAGNOSIS — I739 Peripheral vascular disease, unspecified: Secondary | ICD-10-CM

## 2013-06-30 DIAGNOSIS — Z87891 Personal history of nicotine dependence: Secondary | ICD-10-CM

## 2013-06-30 DIAGNOSIS — S88119A Complete traumatic amputation at level between knee and ankle, unspecified lower leg, initial encounter: Secondary | ICD-10-CM

## 2013-06-30 DIAGNOSIS — E669 Obesity, unspecified: Secondary | ICD-10-CM | POA: Diagnosis present

## 2013-06-30 DIAGNOSIS — N189 Chronic kidney disease, unspecified: Secondary | ICD-10-CM | POA: Diagnosis present

## 2013-06-30 DIAGNOSIS — E1169 Type 2 diabetes mellitus with other specified complication: Secondary | ICD-10-CM | POA: Diagnosis present

## 2013-06-30 DIAGNOSIS — D649 Anemia, unspecified: Secondary | ICD-10-CM | POA: Diagnosis present

## 2013-06-30 DIAGNOSIS — E1149 Type 2 diabetes mellitus with other diabetic neurological complication: Secondary | ICD-10-CM | POA: Diagnosis present

## 2013-06-30 DIAGNOSIS — T148 Other injury of unspecified body region: Secondary | ICD-10-CM | POA: Diagnosis present

## 2013-06-30 DIAGNOSIS — W57XXXA Bitten or stung by nonvenomous insect and other nonvenomous arthropods, initial encounter: Secondary | ICD-10-CM | POA: Diagnosis present

## 2013-06-30 DIAGNOSIS — K219 Gastro-esophageal reflux disease without esophagitis: Secondary | ICD-10-CM | POA: Diagnosis present

## 2013-06-30 DIAGNOSIS — I509 Heart failure, unspecified: Secondary | ICD-10-CM | POA: Diagnosis present

## 2013-06-30 DIAGNOSIS — M9080 Osteopathy in diseases classified elsewhere, unspecified site: Secondary | ICD-10-CM | POA: Diagnosis present

## 2013-06-30 DIAGNOSIS — M869 Osteomyelitis, unspecified: Secondary | ICD-10-CM | POA: Diagnosis present

## 2013-06-30 DIAGNOSIS — R609 Edema, unspecified: Secondary | ICD-10-CM

## 2013-06-30 DIAGNOSIS — N179 Acute kidney failure, unspecified: Secondary | ICD-10-CM

## 2013-06-30 DIAGNOSIS — Z89512 Acquired absence of left leg below knee: Secondary | ICD-10-CM

## 2013-06-30 DIAGNOSIS — E1140 Type 2 diabetes mellitus with diabetic neuropathy, unspecified: Secondary | ICD-10-CM

## 2013-06-30 LAB — CREATININE, SERUM
Creatinine, Ser: 2.14 mg/dL — ABNORMAL HIGH (ref 0.50–1.10)
GFR calc Af Amer: 33 mL/min — ABNORMAL LOW (ref >90)
GFR calc non Af Amer: 29 mL/min — ABNORMAL LOW (ref >90)

## 2013-06-30 LAB — BASIC METABOLIC PANEL
BUN: 37 mg/dL — ABNORMAL HIGH (ref 6–23)
CO2: 20 mEq/L (ref 19–32)
Calcium: 8.1 mg/dL — ABNORMAL LOW (ref 8.4–10.5)
Chloride: 109 mEq/L (ref 96–112)
Creatinine, Ser: 2.08 mg/dL — ABNORMAL HIGH (ref 0.50–1.10)
GFR calc Af Amer: 34 mL/min — ABNORMAL LOW (ref >90)
GFR calc non Af Amer: 30 mL/min — ABNORMAL LOW (ref >90)
Glucose, Bld: 148 mg/dL — ABNORMAL HIGH (ref 70–99)
Potassium: 5.3 mEq/L — ABNORMAL HIGH (ref 3.5–5.1)
Sodium: 137 mEq/L (ref 135–145)

## 2013-06-30 LAB — GLUCOSE, CAPILLARY
Glucose-Capillary: 123 mg/dL — ABNORMAL HIGH (ref 70–99)
Glucose-Capillary: 145 mg/dL — ABNORMAL HIGH (ref 70–99)
Glucose-Capillary: 172 mg/dL — ABNORMAL HIGH (ref 70–99)

## 2013-06-30 LAB — CBC
MCHC: 31.1 g/dL (ref 30.0–36.0)
Platelets: 309 10*3/uL (ref 150–400)
RDW: 17.8 % — ABNORMAL HIGH (ref 11.5–15.5)
WBC: 12.3 10*3/uL — ABNORMAL HIGH (ref 4.0–10.5)

## 2013-06-30 MED ORDER — PANTOPRAZOLE SODIUM 40 MG PO TBEC
40.0000 mg | DELAYED_RELEASE_TABLET | Freq: Every day | ORAL | Status: DC
  Administered 2013-07-01 – 2013-07-08 (×6): 40 mg via ORAL
  Filled 2013-06-30 (×16): qty 1

## 2013-06-30 MED ORDER — ONDANSETRON HCL 4 MG/2ML IJ SOLN: 4.0000 mg | Freq: Four times a day (QID) | INTRAMUSCULAR | Status: DC | PRN

## 2013-06-30 MED ORDER — HEPARIN SODIUM (PORCINE) 5000 UNIT/ML IJ SOLN
5000.0000 [IU] | Freq: Three times a day (TID) | INTRAMUSCULAR | Status: DC
  Administered 2013-07-03 – 2013-07-07 (×5): 5000 [IU] via SUBCUTANEOUS
  Filled 2013-06-30 (×36): qty 1

## 2013-06-30 MED ORDER — SORBITOL 70 % SOLN: 30.0000 mL | Freq: Every day | Status: DC | PRN

## 2013-06-30 MED ORDER — SODIUM BICARBONATE 650 MG PO TABS
650.0000 mg | ORAL_TABLET | Freq: Every day | ORAL | Status: DC
  Administered 2013-07-01 – 2013-07-06 (×6): 650 mg via ORAL
  Filled 2013-06-30 (×14): qty 1

## 2013-06-30 MED ORDER — ISOSORB DINITRATE-HYDRALAZINE 20-37.5 MG PO TABS: 0.5000 | ORAL_TABLET | Freq: Two times a day (BID) | ORAL | Status: DC

## 2013-06-30 MED ORDER — OXYCODONE HCL 5 MG PO TABS
5.0000 mg | ORAL_TABLET | Freq: Four times a day (QID) | ORAL | Status: DC | PRN
  Administered 2013-07-01 – 2013-07-10 (×6): 5 mg via ORAL
  Filled 2013-06-30 (×6): qty 1

## 2013-06-30 MED ORDER — DOCUSATE SODIUM 100 MG PO CAPS
100.0000 mg | ORAL_CAPSULE | Freq: Two times a day (BID) | ORAL | Status: DC
  Administered 2013-07-04: 100 mg via ORAL
  Filled 2013-06-30 (×28): qty 1

## 2013-06-30 MED ORDER — TRIAMCINOLONE ACETONIDE 0.025 % EX CREA
1.0000 "application " | TOPICAL_CREAM | Freq: Three times a day (TID) | CUTANEOUS | Status: DC
  Administered 2013-06-30 – 2013-07-13 (×19): 1 via TOPICAL
  Filled 2013-06-30 (×3): qty 15

## 2013-06-30 MED ORDER — FUROSEMIDE 80 MG PO TABS
80.0000 mg | ORAL_TABLET | Freq: Two times a day (BID) | ORAL | Status: DC
  Administered 2013-06-30 – 2013-07-02 (×4): 80 mg via ORAL
  Filled 2013-06-30 (×6): qty 1

## 2013-06-30 MED ORDER — SODIUM CHLORIDE 0.9 % IJ SOLN
10.0000 mL | INTRAMUSCULAR | Status: DC | PRN
  Administered 2013-06-30: 20 mL

## 2013-06-30 MED ORDER — INSULIN GLARGINE 100 UNIT/ML ~~LOC~~ SOLN
5.0000 [IU] | Freq: Every day | SUBCUTANEOUS | Status: DC
  Administered 2013-06-30 – 2013-07-09 (×10): 5 [IU] via SUBCUTANEOUS
  Filled 2013-06-30 (×11): qty 0.05

## 2013-06-30 MED ORDER — SODIUM CHLORIDE 0.9 % IJ SOLN
10.0000 mL | Freq: Two times a day (BID) | INTRAMUSCULAR | Status: DC
  Administered 2013-07-04: 10 mL

## 2013-06-30 MED ORDER — ACETAMINOPHEN 325 MG PO TABS: 325.0000 mg | ORAL_TABLET | ORAL | Status: DC | PRN

## 2013-06-30 MED ORDER — OXYCODONE-ACETAMINOPHEN 5-325 MG PO TABS
1.0000 | ORAL_TABLET | Freq: Four times a day (QID) | ORAL | Status: DC | PRN
  Administered 2013-06-30 – 2013-07-12 (×13): 1 via ORAL
  Filled 2013-06-30 (×13): qty 1

## 2013-06-30 MED ORDER — PROMETHAZINE HCL 25 MG/ML IJ SOLN: 25.0000 mg | Freq: Four times a day (QID) | INTRAMUSCULAR | Status: DC | PRN

## 2013-06-30 MED ORDER — SODIUM CHLORIDE 0.9 % IJ SOLN
10.0000 mL | INTRAMUSCULAR | Status: DC | PRN
  Administered 2013-06-30 – 2013-07-02 (×2): 20 mL
  Administered 2013-07-03 (×2): 10 mL

## 2013-06-30 MED ORDER — PROMETHAZINE HCL 25 MG RE SUPP: 25.0000 mg | Freq: Four times a day (QID) | RECTAL | Status: DC | PRN

## 2013-06-30 MED ORDER — SODIUM BICARBONATE 650 MG PO TABS: 650.0000 mg | ORAL_TABLET | Freq: Every day | ORAL | Status: DC

## 2013-06-30 MED ORDER — CALCIUM CARBONATE 1250 (500 CA) MG PO TABS
1.0000 | ORAL_TABLET | Freq: Two times a day (BID) | ORAL | Status: DC
  Administered 2013-06-30 – 2013-07-12 (×11): 500 mg via ORAL
  Filled 2013-06-30 (×29): qty 1

## 2013-06-30 MED ORDER — SENNA 8.6 MG PO TABS
2.0000 | ORAL_TABLET | Freq: Two times a day (BID) | ORAL | Status: DC
  Administered 2013-07-04: 17.2 mg via ORAL
  Filled 2013-06-30 (×26): qty 2

## 2013-06-30 MED ORDER — INSULIN GLARGINE 100 UNIT/ML ~~LOC~~ SOLN: 5.0000 [IU] | Freq: Every day | SUBCUTANEOUS | Status: DC

## 2013-06-30 MED ORDER — GUAIFENESIN-DM 100-10 MG/5ML PO SYRP: 5.0000 mL | ORAL_SOLUTION | ORAL | Status: DC | PRN

## 2013-06-30 MED ORDER — DIPHENHYDRAMINE HCL 25 MG PO CAPS
25.0000 mg | ORAL_CAPSULE | Freq: Four times a day (QID) | ORAL | Status: DC | PRN
  Administered 2013-06-30 – 2013-07-12 (×12): 25 mg via ORAL
  Filled 2013-06-30 (×12): qty 1

## 2013-06-30 MED ORDER — CALCIUM CARBONATE 1250 (500 CA) MG PO TABS: 1.0000 | ORAL_TABLET | Freq: Two times a day (BID) | ORAL | Status: DC

## 2013-06-30 MED ORDER — CARVEDILOL 12.5 MG PO TABS: 12.5000 mg | ORAL_TABLET | Freq: Two times a day (BID) | ORAL | Status: DC

## 2013-06-30 MED ORDER — CARVEDILOL 12.5 MG PO TABS
12.5000 mg | ORAL_TABLET | Freq: Two times a day (BID) | ORAL | Status: DC
  Administered 2013-06-30 – 2013-07-06 (×13): 12.5 mg via ORAL
  Filled 2013-06-30 (×16): qty 1

## 2013-06-30 MED ORDER — NEPRO/CARBSTEADY PO LIQD
237.0000 mL | Freq: Two times a day (BID) | ORAL | Status: DC
  Administered 2013-07-01 – 2013-07-13 (×23): 237 mL via ORAL

## 2013-06-30 MED ORDER — HYDROMORPHONE HCL PF 1 MG/ML IJ SOLN: 1.0000 mg | INTRAMUSCULAR | Status: DC | PRN

## 2013-06-30 MED ORDER — PROMETHAZINE HCL 12.5 MG PO TABS: 25.0000 mg | ORAL_TABLET | Freq: Four times a day (QID) | ORAL | Status: DC | PRN

## 2013-06-30 MED ORDER — HEPARIN SODIUM (PORCINE) 5000 UNIT/ML IJ SOLN
5000.0000 [IU] | Freq: Three times a day (TID) | INTRAMUSCULAR | Status: DC
  Filled 2013-06-30 (×2): qty 1

## 2013-06-30 MED ORDER — INSULIN ASPART 100 UNIT/ML ~~LOC~~ SOLN
0.0000 [IU] | Freq: Three times a day (TID) | SUBCUTANEOUS | Status: DC
  Administered 2013-06-30: 1 [IU] via SUBCUTANEOUS
  Administered 2013-07-01: 2 [IU] via SUBCUTANEOUS
  Administered 2013-07-01 – 2013-07-02 (×3): 1 [IU] via SUBCUTANEOUS
  Administered 2013-07-02: 2 [IU] via SUBCUTANEOUS
  Administered 2013-07-02: 1 [IU] via SUBCUTANEOUS
  Administered 2013-07-03 – 2013-07-04 (×3): 2 [IU] via SUBCUTANEOUS
  Administered 2013-07-05: 1 [IU] via SUBCUTANEOUS
  Administered 2013-07-05: 2 [IU] via SUBCUTANEOUS
  Administered 2013-07-06 – 2013-07-09 (×3): 1 [IU] via SUBCUTANEOUS

## 2013-06-30 MED ORDER — ISOSORB DINITRATE-HYDRALAZINE 20-37.5 MG PO TABS
0.5000 | ORAL_TABLET | Freq: Two times a day (BID) | ORAL | Status: DC
  Administered 2013-06-30 – 2013-07-09 (×17): 0.5 via ORAL
  Filled 2013-06-30 (×21): qty 0.5

## 2013-06-30 MED ORDER — ACETAMINOPHEN 325 MG PO TABS
650.0000 mg | ORAL_TABLET | ORAL | Status: DC | PRN
  Filled 2013-06-30: qty 2

## 2013-06-30 NOTE — Progress Notes (Signed)
Patient will be transferred to [facility], report has been given to nurse receiving patient, patient will be transferred in bed and is stable. Ruben Im RN

## 2013-06-30 NOTE — Progress Notes (Signed)
Pt arrived to unit at 1545 with RN at bedside. Reviewed rehab process with pt and safety plan with verbal understanding.  Pt as no further questions at this time.  Call bell within reach, pt resting with no complaints of pain.

## 2013-06-30 NOTE — PMR Pre-admission (Signed)
PMR Admission Coordinator Pre-Admission Assessment  Patient: Karen [name] is an 37 y.o., female MRN: [id number] DOB: [birth year] Height: 5\' 6"  (167.6 cm) Weight: 106.6 kg (235 lb 0.2 oz) (Bed Scale)              Insurance Information HMO:      PPO:       PCP:       IPA:       80/20:       OTHER:   PRIMARY: Medicaid [place] Access      Policy#: [id number]      Subscriber: Jonna Coup CM Name:        Phone#:       Fax#:   Pre-Cert#:        Employer: Disabled from a learning disability Benefits:  Phone #: [phone]     Name:   Eff. Date: Eligible 06/26/13     Deduct:        Out of Pocket Max:        Life Max:   CIR:        SNF:   Outpatient:       Co-Pay:   Home Health:        Co-Pay:   DME:       Co-Pay:   Providers:    Emergency Contact Information Contact Information   Name Relation Home Work [place] Other   [phone]     Current Medical History  Patient Admitting Diagnosis: L BKA  History of Present Illness: A 37 y.o. right-handed female with history of diabetes mellitus with peripheral neuropathy chronic renal insufficiency with baseline creatinine 3.58, chronic anemia as well as amputation left great toe 02/06/2013. Admitted 06/20/2013 with left forefoot pain and drainage of wound as well as low-grade fever. Patient also noted increased cough and shortness of breath. CT of the chest showed moderate bilateral pleural effusions/acute CHF. Patient placed on intravenous Lasix therapy for diuresis. Echocardiogram with ejection fraction [id number] grade 3 diastolic dysfunction. Venous Doppler studies lower extremity showed no evidence of DVT. MRI of left foot showed osteomyelitis distal third and fourth metatarsals. 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 Doran Durand. Postoperative pain management. Subcutaneous heparin added for DVT prophylaxis. Physical therapy evaluation completed 06/25/2013 with recommendations of physical  medicine rehabilitation consult to consider inpatient rehabilitation services. Patient was felt to be a good candidate for inpatient rehabilitation services and will be admitted for comprehensive rehabilitation program    Past Medical History  Past Medical History  Diagnosis Date  . Hypertension   . Anemia   . History of blood transfusion     "last week" (02/02/2013)  . GERD (gastroesophageal reflux disease)   . Osteomyelitis of toe of left foot     "off and on since 2009; no OR" (02/02/2013)  . Renal failure     acute vs chronic/notes 02/02/2013  . Type II diabetes mellitus 1995  . Exertional shortness of breath     "recently; it's fluid" (02/02/2013)   Family History  family history is not on file.  Prior Rehab/Hospitalizations:  No previous rehab.   Current Medications  Current facility-administered medications:acetaminophen (TYLENOL) tablet 650 mg, 650 mg, Oral, Q4H PRN, Etta Quill, DO, 650 mg at 06/24/13 0047;  calcium carbonate (OS-CAL - dosed in mg of elemental calcium) tablet 500 mg of elemental calcium, 1 tablet, Oral, BID WC, Barton Dubois, MD, 500 mg of elemental calcium at 06/30/13 0924;  carvedilol (COREG) tablet 12.5 mg, 12.5 mg, Oral, BID WC, Charlynne Cousins, MD, 12.5 mg at 06/30/13 0603 diphenhydrAMINE (BENADRYL) capsule 25 mg, 25 mg, Oral, Q6H PRN, Charlynne Cousins, MD, 25 mg at 06/30/13 0603;  docusate sodium (COLACE) capsule 100 mg, 100 mg, Oral, BID, Wylene Simmer, MD, 100 mg at 06/28/13 1012;  feeding supplement (NEPRO CARB STEADY) liquid 237 mL, 237 mL, Oral, BID BM, Barton Dubois, MD, 237 mL at 06/30/13 [id number];  furosemide (LASIX) injection 80 mg, 80 mg, Intravenous, BID, Charlynne Cousins, MD, 80 mg at 06/30/13 0758 guaiFENesin-dextromethorphan (ROBITUSSIN DM) 100-10 MG/5ML syrup 5 mL, 5 mL, Oral, Q4H PRN, Etta Quill, DO, 5 mL at 06/21/13 0653;  heparin injection 5,000 Units, 5,000 Units, Subcutaneous, Q8H, Etta Quill, DO, 5,000 Units at 06/29/13  [phone];  HYDROmorphone (DILAUDID) injection 1 mg, 1 mg, Intravenous, Q1H PRN, Wylene Simmer, MD, 1 mg at 06/29/13 1718 insulin aspart (novoLOG) injection 0-9 Units, 0-9 Units, Subcutaneous, TID WC, Randall M Reidler, PA-C, 1 Units at 06/30/13 0620;  isosorbide-hydrALAZINE (BIDIL) 20-37.5 MG per tablet 0.5 tablet, 0.5 tablet, Oral, BID, Barton Dubois, MD, 0.5 tablet at 06/30/13 [id number];  metoCLOPramide (REGLAN) injection 5-10 mg, 5-10 mg, Intravenous, Q8H PRN, Wylene Simmer, MD;  metoCLOPramide (REGLAN) tablet 5-10 mg, 5-10 mg, Oral, Q8H PRN, Wylene Simmer, MD ondansetron [facility]) injection 4 mg, 4 mg, Intravenous, Q6H PRN, Etta Quill, DO, 4 mg at 06/23/13 2058;  oxyCODONE (Oxy IR/ROXICODONE) immediate release tablet 5 mg, 5 mg, Oral, Q6H PRN, Charlynne Cousins, MD, 5 mg at 06/26/13 1719;  oxyCODONE-acetaminophen (PERCOCET/ROXICET) 5-325 MG per tablet 1 tablet, 1 tablet, Oral, Q6H PRN, Charlynne Cousins, MD, 1 tablet at 06/30/13 0603 pantoprazole (PROTONIX) EC tablet 40 mg, 40 mg, Oral, Q1200, Charlynne Cousins, MD, 40 mg at 06/29/13 1233;  promethazine (PHENERGAN) injection 25 mg, 25 mg, Intravenous, Q6H PRN, Charlynne Cousins, MD, 25 mg at 06/21/13 1511;  promethazine (PHENERGAN) suppository 25 mg, 25 mg, Rectal, Q6H PRN, Charlynne Cousins, MD;  promethazine (PHENERGAN) tablet 25 mg, 25 mg, Oral, Q6H PRN, Charlynne Cousins, MD senna [facility]) tablet 17.2 mg, 2 tablet, Oral, BID, Wylene Simmer, MD, 17.2 mg at 06/28/13 1012;  sodium bicarbonate tablet 650 mg, 650 mg, Oral, Daily, Barton Dubois, MD, 650 mg at 06/28/13 1013;  sodium chloride 0.9 % injection 10-40 mL, 10-40 mL, Intracatheter, PRN, Charlynne Cousins, MD, 20 mL at 06/30/13 0831 triamcinolone (KENALOG) [id number] % cream 1 application, 1 application, Topical, TID, Charlynne Cousins, MD, 1 application at [phone]  Patients Current Diet: Carb Control  Precautions / Restrictions Precautions Precautions: Fall Precaution Comments: avoid  prolonged knee or hip flexion surgical side, KI in place this eval Restrictions Weight Bearing Restrictions: Yes LLE Weight Bearing: Non weight bearing Other Position/Activity Restrictions: elevate LEFT extremity, but do not place pillow behind knee.  Lay on side or prone if able to stretch hip extensor   Prior Activity Level Limited Community (1-2x/wk): Went out 2-3 X a week.  Her aunt drives her.  Home Assistive Devices / Equipment Home Assistive Devices/Equipment: None Home Equipment: None  Prior Functional Level Prior Function Level of Independence: Independent  Current Functional Level Cognition  Overall Cognitive Status: Within Functional Limits for tasks assessed Orientation Level: Oriented X4    Extremity Assessment (includes Sensation/Coordination)          ADLs  Eating/Feeding: Independent Where Assessed - Eating/Feeding: Bed level Grooming: Wash/dry hands;Teeth care;Wash/dry face;Set up Where Assessed - Grooming: Unsupported  sitting Upper Body Bathing: Supervision/safety Where Assessed - Upper Body Bathing: Unsupported sitting Lower Body Bathing: +1 Total assistance;+2 Total assistance Lower Body Bathing: Patient Percentage: 0% Where Assessed - Lower Body Bathing: Unsupported sitting;Supported sit to stand Upper Body Dressing: Supervision/safety Where Assessed - Upper Body Dressing: Unsupported sitting Lower Body Dressing: +2 Total assistance Lower Body Dressing: Patient Percentage: 0% Where Assessed - Lower Body Dressing: Unsupported sitting;Supported sit to stand Toilet Transfer: +2 Total assistance Toilet Transfer: Patient Percentage: 40% Toilet Transfer Method: Stand pivot Science writer: Therapist, occupational and Hygiene: +2 Total assistance Toileting - Clothing Manipulation and Hygiene: Patient Percentage: 0% Where Assessed - Best boy and Hygiene: Sit to stand from 3-in-1 or  toilet Equipment Used:  (level 3 theraband) Transfers/Ambulation Related to ADLs: pt with increased anxiety with sit to stand and transfers, required step by step verbal cues and maximum encouragement ADL Comments: Pt with much improved processing speed, less anxious. Wants to get to rehab as soon as possible.      Mobility  Bed Mobility: Supine to Sit;Sitting - Scoot to [employer] of Bed;Sit to Supine;Scooting to [facility] Supine to Sit: 5: Supervision;With rails;HOB elevated Sitting - Scoot to Edge of Bed: 5: Supervision;With rail Sit to Supine: 4: Min assist Scooting to [facility]: 6: Modified independent (Device/Increase time)    Transfers  Transfers: Sit to Stand;Stand to Sit;Stand Pivot Transfers Sit to Stand: 3: Mod assist;From elevated surface Sit to Stand: Patient Percentage: 50% Stand to Sit: 4: Min assist;To elevated surface Stand to Sit: Patient Percentage: 50% Stand Pivot Transfers: Not tested (comment) Stand Pivot Transfers: Patient Percentage: 40%    Ambulation / Gait / Stairs / Wheelchair Mobility  Ambulation/Gait Ambulation/Gait Assistance: 1: +2 Total assist Ambulation/Gait: Patient Percentage: 70% Ambulation Distance (Feet): 5 Feet Assistive device: Rolling walker Ambulation/Gait Assistance Details: Pt able to ambulate 5 feet with the RW.  Able to move the RW forward as well.  Pt using UEs as well to gain pushing power to step.  [place] chair behind pt.   Gait Pattern: Step-to pattern;Decreased stride length;Trunk flexed;Wide base of support Gait velocity: decreased Stairs: No Wheelchair Mobility Wheelchair Mobility: No    Posture / Balance Static Sitting Balance Static Sitting - Balance Support: Feet supported Static Sitting - Level of Assistance: 5: Stand by assistance Static Sitting - Comment/# of Minutes: 10 Static Standing Balance Static Standing - Balance Support: Bilateral upper extremity supported Static Standing - Level of Assistance: 4: Min assist Dynamic Standing  Balance Dynamic Standing - Balance Support: Bilateral upper extremity supported;During functional activity Dynamic Standing - Level of Assistance: 1: +2 Total assist;Patient percentage (comment) (pt =40%) Dynamic Standing - Balance Activities: Forward lean/weight shifting;Lateral lean/weight shifting Dynamic Standing - Comments: Pt beginning to show that she can maintain balance statically and dynamically with RW.      Special needs/care consideration BiPAP/CPAP No CPM No Continuous Drip IV No Dialysis No      Life Vest No Oxygen No Special Bed No Trach Size No Wound Vac (area) No     Skin Has L BKA incision.  Has ?eczema like condition on right leg, right had and on back.  Says she needs to follow up with dermatologist.                               Bowel mgmt: Had BM 06/29/13 Bladder mgmt:Voiding on bedpan Diabetic mgmt yes, on insulin at home.  Previous Home Environment Living Arrangements: Children Available Help at Discharge: Family;Personal care attendant;Available 24 hours/day Type of Home: House Home Layout: One level Home Access: Stairs to enter [employer] of Steps: 5 Bathroom Shower/Tub: Chiropodist: Standard Home Care Services: Yes Type of Home Care Services: [place] (if known): [facility] Additional Comments: states landlord resistant to having ramp put in for access to home.  Says "I know we gonna need a plan with the kids and all"  Discharge Living Setting Plans for Discharge Living Setting: Patient's home;House;Lives with (comment) (Lives with 3 children.  Twins 75 yo and a 37 yo child.) Type of Home at Discharge: House Discharge Home Layout: One level Discharge Home Access: Stairs to enter Entrance Stairs-Number of Steps: 4-5 step entry Does the patient have any problems obtaining your medications?: No  Social/Family/Support Systems Patient Roles: Parent Contact Information: Netta Neat -  Godmother Anticipated Caregiver: self, Tammy and aunt Anticipated Caregiver's Contact Information: Lynelle Smoke [phone] Ability/Limitations of Caregiver: Godmother and aunt do errands and driving.  They will try to have someone stay with patient during the day. Caregiver Availability: Intermittent (Has in home health 2.8 hrs a day with [facility].) Discharge Plan Discussed with Primary Caregiver: Yes Is Caregiver In Agreement with Plan?: Yes Does Caregiver/Family have Issues with Lodging/Transportation while Pt is in Rehab?: No  Goals/Additional Needs Patient/Family Goal for Rehab: PT/OT mod I goals, no ST needs Expected length of stay: 7-10 days Cultural Considerations: None Dietary Needs: Carb mod med calorie diabetic diet with thin liquids Equipment Needs: TBD Pt/Family Agrees to Admission and willing to participate: Yes Program Orientation Provided & Reviewed with Pt/Caregiver Including Roles  & Responsibilities: Yes   Decrease burden of Care through IP rehab admission: N/A  Possible need for SNF placement upon discharge: Not likely   Patient Condition: This patient's medical and functional status has changed since the consult dated: 06/26/13 in which the Rehabilitation Physician determined and documented that the patient's condition is appropriate for intensive rehabilitative care in an inpatient rehabilitation facility. See "History of Present Illness" (above) for medical update. Functional changes are: Currently requiring min/mod assist for transfers. Patient's medical and functional status update has been discussed with the Rehabilitation physician and patient remains appropriate for inpatient rehabilitation. Will admit to inpatient rehab today.  Preadmission Screen Completed By:  Retta Diones, 06/30/2013 11:41 AM ______________________________________________________________________   Discussed status with Dr.  Naaman Plummer on 06/30/13 at 1154 and received telephone  approval for admission today.  Admission Coordinator:  Retta Diones, time1154/Date10/10/14

## 2013-06-30 NOTE — H&P (View-Only) (Signed)
Physical Medicine and Rehabilitation Admission H&P    No chief complaint on file. : HPI: Karen [name] is a 37 y.o. right-handed female with history of diabetes mellitus with peripheral neuropathy chronic renal insufficiency with baseline creatinine 3.58, chronic anemia as well as amputation left great toe 02/06/2013. Admitted 06/20/2013 with left forefoot pain and drainage of wound as well as low-grade fever. Patient also noted increased cough and shortness of breath. CT of the chest showed moderate bilateral pleural effusions/acute CHF. Patient placed on intravenous Lasix therapy for diuresis. Echocardiogram with ejection fraction [id number] grade 3 diastolic dysfunction. Venous Doppler studies lower extremity showed no evidence of DVT.  MRI of left foot showed osteomyelitis distal third and fourth metatarsals. 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 Doran Durand. Postoperative pain management. Subcutaneous heparin added for DVT prophylaxis. Physical therapy evaluation completed 06/25/2013 with recommendations of physical medicine rehabilitation consult to consider inpatient rehabilitation services. Patient was felt to be a good candidate for inpatient rehabilitation services was admitted for comprehensive rehabilitation program  Review of Systems  Respiratory: Positive for shortness of breath.  Cardiovascular: Positive for leg swelling.  Gastrointestinal:  GERD  Musculoskeletal: Positive for myalgias and joint pain.  All other systems reviewed and are negative   Past Medical History  Diagnosis Date  . Hypertension   . Anemia   . History of blood transfusion     "last week" (02/02/2013)  . GERD (gastroesophageal reflux disease)   . Osteomyelitis of toe of left foot     "off and on since 2009; no OR" (02/02/2013)  . Renal failure     acute vs chronic/notes 02/02/2013  . Type II diabetes mellitus 1995  . Exertional shortness of breath     "recently;  it's fluid" (02/02/2013)   Past Surgical History  Procedure Laterality Date  . Cesarean section  10/18/2006  . Amputation Left 02/06/2013    Procedure: AMPUTATION LEFT GREAT TOE;  Surgeon: Wylene Simmer, MD;  Location: [facility];  Service: Orthopedics;  Laterality: Left;   History reviewed. No pertinent family history. Social History:  reports that she quit smoking about 8 years ago. Her smoking use included Cigarettes. She has a 30 pack-year smoking history. She has never used smokeless tobacco. She reports that she does not drink alcohol or use illicit drugs. Allergies:  Allergies  Allergen Reactions  . Morphine And Related Rash  . Penicillins Rash   Medications Prior to Admission  Medication Sig Dispense Refill  . calcium carbonate (TUMS - DOSED IN MG ELEMENTAL CALCIUM) 500 MG chewable tablet Chew 1 tablet by mouth as needed for heartburn.      . carvedilol (COREG) 6.25 MG tablet Take 1 tablet (6.25 mg total) by mouth 2 (two) times daily with a meal.  60 tablet  1  . esomeprazole (NEXIUM) 40 MG capsule Take 40 mg by mouth daily before breakfast.      . furosemide (LASIX) 40 MG tablet Take 40 mg by mouth daily.      . hydrochlorothiazide (HYDRODIURIL) 25 MG tablet Take 25 mg by mouth 2 (two) times daily.      . insulin glargine (LANTUS) 100 UNIT/ML injection Inject 18 Units into the skin at bedtime.       Marland Kitchen oxyCODONE-acetaminophen (PERCOCET) 10-325 MG per tablet Take 1 tablet by mouth every 6 (six) hours as needed for pain (leg/back pain).      . triamcinolone (KENALOG) 0.025 % cream Apply 1 application topically 3 (  three) times daily. Apply to entire body for itching      . [DISCONTINUED] calcium carbonate (TUMS - DOSED IN MG ELEMENTAL CALCIUM) 500 MG chewable tablet Chew 1 tablet (200 mg of elemental calcium total) by mouth daily with breakfast.  30 tablet  1  . [DISCONTINUED] clindamycin (CLEOCIN) 300 MG capsule Take 1 capsule (300 mg total) by mouth every 6 (six) hours.  28 capsule  0  .  [DISCONTINUED] Nutritional Supplements (FEEDING SUPPLEMENT, NEPRO CARB STEADY,) LIQD Take 237 mLs by mouth 2 (two) times daily between meals.    0    Home: Home Living Family/patient expects to be discharged to:: Private residence Living Arrangements: Children (66 yo twins, 54 yo; other non specific family 'in and out') Available Help at Discharge: Family;Personal care attendant;Available 24 hours/day (per pt) Type of Home: House Home Access: Stairs to enter [employer] of Steps: 5 Home Layout: One level Home Equipment: None Additional Comments: states landlord resistant to having ramp put in for access to home.  Says "I know we gonna need a plan with the kids and all"   Functional History:    Functional Status:  Mobility: Bed Mobility Bed Mobility: Supine to Sit;Sit to Supine;Scooting to [facility];Sitting - Scoot to Edge of Bed Supine to Sit: HOB elevated;With rails;3: Mod assist Sitting - Scoot to Edge of Bed: 4: Min assist;With rail Sit to Supine: 4: Min assist;With rail;HOB flat Scooting to HOB: 3: Mod assist (with bed pad) Transfers Transfers: Not assessed Ambulation/Gait Ambulation/Gait Assistance: Not tested (comment) Stairs: No Wheelchair Mobility Wheelchair Mobility: No  ADL:    Cognition: Cognition Overall Cognitive Status: Within Functional Limits for tasks assessed Orientation Level: Oriented X4 Cognition Arousal/Alertness: Suspect due to medications;Lethargic Behavior During Therapy: WFL for tasks assessed/performed Overall Cognitive Status: Within Functional Limits for tasks assessed  Physical Exam: Blood pressure 131/94, pulse 97, temperature 98.5 F (36.9 C), temperature source Oral, resp. rate 18, height 5\' 6"  (1.676 m), weight 105.5 kg (232 lb 9.4 oz), last menstrual period 05/22/2013, SpO2 97.00%. Constitutional: She is oriented to person, place, and time. She appears well-developed and well-nourished. obese HENT:  Head: Normocephalic and  atraumatic.  Right Ear: External ear normal.  Left Ear: External ear normal.  Eyes: Conjunctivae and EOM are normal. Pupils are equal, round, and reactive to light.  Neck: Normal range of motion. Neck supple. No JVD present. No tracheal deviation present. No thyromegaly present.  Cardiovascular: Normal rate and regular rhythm.   No murmur heard.  Pulmonary/Chest: No respiratory distress. She has no wheezes. She has no rales.  Decreased breath sounds at the bases but clear to auscultation.  Abdominal: Soft. Bowel sounds are normal. She exhibits no distension.  Musculoskeletal:  Left leg, dressed, expectedly tender to touch and ROM. Able to lift left leg in KI slightly off the bed. Lymphadenopathy:  She has no cervical adenopathy.  Neurological: She is alert and oriented to person, place, and time. No cranial nerve deficit. Coordination normal.  Patient follows commands. UE strenght 5/5 bilaterally. RLE is 4/5 HF,KE to 5/5 at ankle. LLE HF could slightly lift agst gravity. Decreased PP,LT RLE distally.   Skin:  Left BKA site is dressed and appropriately tender. Multiple abrasions, old scars throughout the RLE. Psychiatric: She has a normal mood and affect. Her behavior is normal. Thought content normal   Results for orders placed during the hospital encounter of 06/20/13 (from the past 48 hour(s))  GLUCOSE, CAPILLARY     Status: None  Collection Time    06/24/13 11:50 AM      Result Value Range   Glucose-Capillary 75  70 - 99 mg/dL   Comment 1 Documented in Chart     Comment 2 Notify RN    BASIC METABOLIC PANEL     Status: Abnormal   Collection Time    06/24/13 12:43 PM      Result Value Range   Sodium 128 (*) 135 - 145 mEq/L   Potassium 4.1  3.5 - 5.1 mEq/L   Chloride 99  96 - 112 mEq/L   CO2 18 (*) 19 - 32 mEq/L   Glucose, Bld 71  70 - 99 mg/dL   BUN 32 (*) 6 - 23 mg/dL   Creatinine, Ser 3.06 (*) 0.50 - 1.10 mg/dL   Calcium 7.2 (*) 8.4 - 10.5 mg/dL   GFR calc non Af Amer 19  (*) >90 mL/min   GFR calc Af Amer 21 (*) >90 mL/min   Comment: (NOTE)     The eGFR has been calculated using the CKD EPI equation.     This calculation has not been validated in all clinical situations.     eGFR's persistently <90 mL/min signify possible Chronic Kidney     Disease.  CBC WITH DIFFERENTIAL     Status: Abnormal   Collection Time    06/24/13 12:43 PM      Result Value Range   WBC 22.3 (*) 4.0 - 10.5 K/uL   RBC 3.14 (*) 3.87 - 5.11 MIL/uL   Hemoglobin 7.8 (*) 12.0 - 15.0 g/dL   Comment: POST TRANSFUSION SPECIMEN   HCT 24.4 (*) 36.0 - 46.0 %   MCV 77.7 (*) 78.0 - 100.0 fL   MCH 24.8 (*) 26.0 - 34.0 pg   MCHC 32.0  30.0 - 36.0 g/dL   RDW 16.1 (*) 11.5 - 15.5 %   Platelets 282  150 - 400 K/uL   Neutrophils Relative % 88 (*) 43 - 77 %   Neutro Abs 19.7 (*) 1.7 - 7.7 K/uL   Lymphocytes Relative 5 (*) 12 - 46 %   Lymphs Abs 1.1  0.7 - 4.0 K/uL   Monocytes Relative 7  3 - 12 %   Monocytes Absolute 1.5 (*) 0.1 - 1.0 K/uL   Eosinophils Relative 0  0 - 5 %   Eosinophils Absolute 0.1  0.0 - 0.7 K/uL   Basophils Relative 0  0 - 1 %   Basophils Absolute 0.0  0.0 - 0.1 K/uL  SURGICAL PCR SCREEN     Status: None   Collection Time    06/24/13 12:53 PM      Result Value Range   MRSA, PCR NEGATIVE  NEGATIVE   Staphylococcus aureus NEGATIVE  NEGATIVE   Comment:            The Xpert SA Assay (FDA     approved for NASAL specimens     in patients over 65 years of age),     is one component of     a comprehensive surveillance     program.  Test performance has     been validated by [employer] for patients greater     than or equal to 60 year old.     It is not intended     to diagnose infection nor to     guide or monitor treatment.  PREPARE RBC (CROSSMATCH)     Status: None   Collection  Time    06/24/13  3:00 PM      Result Value Range   Order Confirmation ORDER PROCESSED BY BLOOD BANK    GLUCOSE, CAPILLARY     Status: Abnormal   Collection Time    06/24/13  5:03  PM      Result Value Range   Glucose-Capillary 67 (*) 70 - 99 mg/dL   Comment 1 Notify RN     Comment 2 Call MD NNP PA CNM     Comment 3 Documented in Chart    GLUCOSE, CAPILLARY     Status: None   Collection Time    06/24/13  6:00 PM      Result Value Range   Glucose-Capillary 83  70 - 99 mg/dL   Comment 1 Notify RN     Comment 2 Documented in Chart    GLUCOSE, CAPILLARY     Status: None   Collection Time    06/24/13  8:08 PM      Result Value Range   Glucose-Capillary 79  70 - 99 mg/dL   Comment 1 Documented in Chart     Comment 2 Notify RN    GLUCOSE, CAPILLARY     Status: None   Collection Time    06/25/13  1:31 AM      Result Value Range   Glucose-Capillary 75  70 - 99 mg/dL   Comment 1 Notify RN    BASIC METABOLIC PANEL     Status: Abnormal   Collection Time    06/25/13  5:00 AM      Result Value Range   Sodium 129 (*) 135 - 145 mEq/L   Potassium 4.0  3.5 - 5.1 mEq/L   Chloride 100  96 - 112 mEq/L   CO2 18 (*) 19 - 32 mEq/L   Glucose, Bld 106 (*) 70 - 99 mg/dL   BUN 33 (*) 6 - 23 mg/dL   Creatinine, Ser 3.05 (*) 0.50 - 1.10 mg/dL   Calcium 7.2 (*) 8.4 - 10.5 mg/dL   GFR calc non Af Amer 19 (*) >90 mL/min   GFR calc Af Amer 22 (*) >90 mL/min   Comment: (NOTE)     The eGFR has been calculated using the CKD EPI equation.     This calculation has not been validated in all clinical situations.     eGFR's persistently <90 mL/min signify possible Chronic Kidney     Disease.  CBC     Status: Abnormal   Collection Time    06/25/13  5:00 AM      Result Value Range   WBC 20.2 (*) 4.0 - 10.5 K/uL   RBC 3.49 (*) 3.87 - 5.11 MIL/uL   Hemoglobin 8.9 (*) 12.0 - 15.0 g/dL   HCT 26.7 (*) 36.0 - 46.0 %   MCV 76.5 (*) 78.0 - 100.0 fL   MCH 25.5 (*) 26.0 - 34.0 pg   MCHC 33.3  30.0 - 36.0 g/dL   RDW 16.8 (*) 11.5 - 15.5 %   Platelets 292  150 - 400 K/uL  GLUCOSE, CAPILLARY     Status: Abnormal   Collection Time    06/25/13  5:32 AM      Result Value Range    Glucose-Capillary 106 (*) 70 - 99 mg/dL  GLUCOSE, CAPILLARY     Status: None   Collection Time    06/25/13 12:28 PM      Result Value Range   Glucose-Capillary 82  70 - 99  mg/dL   Comment 1 Notify RN     Comment 2 Documented in Chart    GLUCOSE, CAPILLARY     Status: None   Collection Time    06/25/13  4:38 PM      Result Value Range   Glucose-Capillary 90  70 - 99 mg/dL   Comment 1 Documented in Chart     Comment 2 Notify RN    GLUCOSE, CAPILLARY     Status: None   Collection Time    06/25/13  8:53 PM      Result Value Range   Glucose-Capillary 85  70 - 99 mg/dL   Comment 1 Notify RN    GLUCOSE, CAPILLARY     Status: None   Collection Time    06/26/13  5:57 AM      Result Value Range   Glucose-Capillary 77  70 - 99 mg/dL   No results found.  Post Admission Physician Evaluation: 1. Functional deficits secondary  to left BKA. 2. Patient is admitted to receive collaborative, interdisciplinary care between the physiatrist, rehab nursing staff, and therapy team. 3. Patient's level of medical complexity and substantial therapy needs in context of that medical necessity cannot be provided at a lesser intensity of care such as a SNF. 4. Patient has experienced substantial functional loss from his/her baseline which was documented above under the "Functional History" and "Functional Status" headings.  Judging by the patient's diagnosis, physical exam, and functional history, the patient has potential for functional progress which will result in measurable gains while on inpatient rehab.  These gains will be of substantial and practical use upon discharge  in facilitating mobility and self-care at the household level. 5. Physiatrist will provide 24 hour management of medical needs as well as oversight of the therapy plan/treatment and provide guidance as appropriate regarding the interaction of the two. 6. 24 hour rehab nursing will assist with bladder management, bowel management, safety,  skin/wound care, disease management, medication administration, pain management and patient education  and help integrate therapy concepts, techniques,education, etc. 7. PT will assess and treat for/with: Lower extremity strength, range of motion, stamina, balance, functional mobility, safety, adaptive techniques and equipment, pre-pros education, pain mgt, education.   Goals are: mod I. 8. OT will assess and treat for/with: ADL's, functional mobility, safety, upper extremity strength, adaptive techniques and equipment, pain mgt, pre-pros ed.   Goals are: mod I. 9. SLP will assess and treat for/with: n/a.  Goals are: n/a. 10. Case Management and Social Worker will assess and treat for psychological issues and discharge planning. 11. Team conference will be held weekly to assess progress toward goals and to determine barriers to discharge. 12. Patient will receive at least 3 hours of therapy per day at least 5 days per week. 13. ELOS: 7-10 days       14. Prognosis:  excellent   Medical Problem List and Plan: 1. 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 DVT Prophylaxis/Anticoagulation: Subcutaneous heparin. Monitor platelet counts and any signs of bleeding 3. Pain Management: Oxycodone as needed. Monitor with increased mobility 4. Neuropsych: This patient is capable of making decisions on her own behalf. 5. Chronic anemia. Latest hemoglobin 8.9. Followup CBC. Latest hemoglobin prior to admission 8.6 6. Acute CHF/bronchitis. Lasix 80 mg twice daily.  No complaints of shortness of breath. Followup chest x-ray. Strict I and O.'s 7. Diabetes mellitus with peripheral neuropathy. Hemoglobin A1c 6.6. Presently with sliding scale insulin. Patient on Lantus insulin 18 units each bedtime  prior to admission. Check blood sugars a.c. and at bedtime and resume scheduled insulin as tolerated 8. Hypertension: Coreg 12.5 mg twice a day, Bidil 0.5 mg twice a day. Monitor with increased mobility 9. Chronic  renal insufficiency. Baseline creatinine 3.58. Strict I and O.'s. Followup chemistries 10. GERD. Protonix  [name], MD, [facility]  06/26/2013

## 2013-06-30 NOTE — Progress Notes (Signed)
Rehab admissions - I spoke with Dr. Dyann Kief and patient medically stable for inpatient rehab admission.  Bed available, patient agreeable, and will admit to inpatient rehab today.  Call me for questions.  [phone]

## 2013-06-30 NOTE — Progress Notes (Signed)
Physical Therapy Treatment Patient Details Name: Karen [name] MRN: [id number] DOB: June 17, 1976 Today's Date: 06/30/2013 Time: [id number] PT Time Calculation (min): 24 min  PT Assessment / Plan / Recommendation  History of Present Illness Pt is s/p left BKA.     PT Comments   Pt admitted with above. Pt currently with functional limitations due to continued deficits in areas of balance and endurance for activity.  Pt will benefit from skilled PT to increase their independence and safety with mobility to allow discharge to the venue listed below.    Follow Up Recommendations  CIR                 Equipment Recommendations  Wheelchair (measurements PT);Wheelchair cushion (measurements PT);Rolling walker with 5" wheels;3in1 (PT)    Recommendations for Other Services Rehab consult  Frequency Min 3X/week   Progress towards PT Goals Progress towards PT goals: Progressing toward goals  Plan Current plan remains appropriate    Precautions / Restrictions Precautions Precautions: Fall Precaution Comments: avoid prolonged knee or hip flexion surgical side, KI in place this eval Required Braces or Orthoses: Knee Immobilizer - Left Knee Immobilizer - Left: Other (comment) (no specification) Restrictions Weight Bearing Restrictions: Yes LLE Weight Bearing: Non weight bearing Other Position/Activity Restrictions: elevate LEFT extremity, but do not place pillow behind knee.  Lay on side or prone if able to stretch hip extensor   Pertinent Vitals/Pain VSS, no pain    Mobility  Bed Mobility Bed Mobility: Not assessed Transfers Transfers: Not assessed Ambulation/Gait Ambulation/Gait Assistance: Not tested (comment) Stairs: No Wheelchair Mobility Wheelchair Mobility: No    Exercises General Exercises - Upper Extremity Shoulder Flexion: Strengthening;Both;15 reps;Supine;Theraband Theraband Level (Shoulder Flexion): Level 3 (Green) Shoulder Extension: Strengthening;Both;20  reps;Supine;Theraband Theraband Level (Shoulder Extension): Level 3 (Green) Shoulder Horizontal ABduction: Strengthening;Both;15 reps;Supine;Theraband Theraband Level (Shoulder Horizontal Abduction): Level 3 (Green) Elbow Flexion: Strengthening;15 reps;Supine;Theraband Theraband Level (Elbow Flexion): Level 3 (Green) Elbow Extension: Strengthening;15 reps;Supine;Theraband Theraband Level (Elbow Extension): Level 3 (Green) Amputee Exercises Quad Sets: Left;10 reps;Supine Hip ABduction/ADduction: AROM;10 reps;Supine;Left Straight Leg Raises: AROM;10 reps;Left;Supine    PT Goals (current goals can now be found in the care plan section)    Visit Information  Last PT Received On: 06/30/13 Assistance Needed: +1 History of Present Illness: Pt is s/p left BKA.      Subjective Data  Subjective: "I am going to rehab."   Cognition  Cognition Arousal/Alertness: Awake/alert Behavior During Therapy: WFL for tasks assessed/performed Overall Cognitive Status: Within Functional Limits for tasks assessed    Balance  Static Sitting Balance Static Sitting - Level of Assistance: Not tested (comment) Static Standing Balance Static Standing - Level of Assistance: Not tested (comment)  End of Session PT - End of Session Equipment Utilized During Treatment: Gait belt Activity Tolerance: Patient tolerated treatment well Patient left: in bed;with call bell/phone within reach Nurse Communication: None needed       INGOLD,Bianey Tesoro 06/30/2013, 12:40 PM  [facility] [phone] (pager)

## 2013-06-30 NOTE — Progress Notes (Signed)
Patient has been transported to [facility]

## 2013-06-30 NOTE — Interval H&P Note (Signed)
Karen [name] was admitted today to Inpatient Rehabilitation with the diagnosis of left BKA.  The patient's history has been reviewed, patient examined, and there is no change in status.  Patient continues to be appropriate for intensive inpatient rehabilitation.  I have reviewed the patient's chart and labs.  Questions were answered to the patient's satisfaction.  SWARTZ,ZACHARY T 06/30/2013, 7:37 PM

## 2013-06-30 NOTE — Discharge Summary (Signed)
Physician Discharge Summary  Karen [name] [id number] DOB: Feb 15, 1976 DOA: 06/20/2013  PCP: [name], MD  Admit date: 06/20/2013 Discharge date: 06/30/2013  Time spent: >30 minutes  Recommendations for Outpatient Follow-up:  1-fluid restriction 1.5 L 2-sodium restriction 2-2.5 G in 24 hours 3-follow up with PCP in 1 week after discharge from hospital (reevaluate diabetes control and adjust insulin needs; also recheck BMET to follow renal function and electrolytes) 4-reassess BP and adjust antihypertensive regimen as needed 5-follow up with Dr. Doran Durand (orthopedic service) in 2-3 weeks    Discharge Diagnoses:  Principal Problem:   S/P BKA (below knee amputation) unilateral Active Problems:   Type II or unspecified type diabetes mellitus with unspecified complication, uncontrolled   Acute combined systolic and diastolic congestive heart failure   Essential hypertension   Cardiomyopathy, ischemic - EF 45-50% with inf WMA by 2D 02/05/13   CKD (chronic kidney disease) stage 3, GFR 30-59 ml/min due to DM   Acute respiratory failure   Foot osteomyelitis, left   Physical deconditioning   Discharge Condition: stable and improved. Will discharge to inpatient rehab.  Diet recommendation: heart healthy and low carb diet  Filed Weights   06/28/13 0612 06/29/13 0523 06/30/13 0644  Weight: 104.9 kg (231 lb 4.2 oz) 106 kg (233 lb 11 oz) 106.6 kg (235 lb 0.2 oz)    History of present illness:  37 y.o. female who presents to the ED at [facility] with c/o SOB. Symptoms have been going on for 2-3 days now, but she has had some SOB and cough for several months. Cough is productive of a clear frothy sputum. Associated with pedal edema increasing over the last few months. Associated with orthopnea. Lying down makes it worse, sitting up makes it better.   Hospital Course:  Left foot wound/fevers:  -S/p amputation of left leg, BKA on 10.4.2014  -will discharge to inpatient rehab -will  follow orthopedics rec's for wound care and weight bearing  Acute respiratory failure/Acute combined systolic and diastolic congestive heart failure/ F 45-50% with inf WMA by 2D 02/05/13/ Acute bronchitis:  -Electrolytes and kidney function stable.  -breathing better; still with 2+ edema on her Le's (but overall improving); no JVD, no crackles and denies SOB. -continue close I's and O's; daily weight  -will use lasix 80mg  BID PO  CKD (chronic kidney disease) stage 3, GFR 30-59 ml/min due to DM  - Cr 2.12 ( at baseline Cr fluctuates from 2.5-3.0)  -patient will be transition to lasix 80mg  BID -Continue calcium supplementation and also daily bicarbonate tablets  -close follow up of renal function in outpatient setting  Protein calorie malnutrition and hypoalbuminemia:  -continue NEPRO   Essential hypertension  -stable.  -Continue BiDil and coreg -patient advise/instructed to follow a low sodium diet  Type II or unspecified type diabetes mellitus with unspecified complication, uncontrolled  -cont lantus (now 5 units QHS only) -while in the hospital will also continue SSI -close follow up with PCP for medication adjustments -patient advise to follow low carb diet  plus SSI.  -CBGs with good control range.   Hypocalcemia  -Continue Oscal twice a day   Physical deconditioning -due to exacerbation of CHF and s/p left BKA Will discharge to inpatient rehab for physical rehabilitation   Procedures:  S/p Left BKA  Echo:  The cavity size was normal. Wall thickness was normal. Systolic function was mildly to moderately reduced. The estimated ejection fraction was in the range of 40% to 45%. There is moderate hypokinesis of the  inferoseptal myocardium. Doppler parameters are consistent with a reversible restrictive pattern, indicative of decreased left ventricular diastolic compliance and/or increased left atrial pressure (grade 3 diastolic dysfunction). Doppler parameters are  consistent with both elevated ventricular end-diastolic filling pressure and elevated left atrial filling pressure. -moderate mitral and tricuspid valve regurgitation  Consultations:  Orthopedic service (Dr. Doran Durand)  Inpatient rehab   Discharge Exam: Filed Vitals:   06/30/13 0920  BP: 137/7  Pulse: 89  Temp: 97.2 F (36.2 C)  Resp: 18   General: Alert, awake, oriented x3, in no acute distress.  HEENT: No bruits, no goiter. no JVD.  Heart: Regular rate and rhythm, without murmurs, rubs, gallops.  Lungs: Good air movement, clear to auscultation.  Abdomen: Soft, nontender, nondistended, positive bowel sounds.  EXT: left BKA; RLE with 2++ swelling. Neuro: non focal  Discharge Instructions     Medication List    STOP taking these medications       calcium carbonate 500 MG chewable tablet  Commonly known as:  TUMS - dosed in mg elemental calcium     clindamycin 300 MG capsule  Commonly known as:  CLEOCIN     feeding supplement (NEPRO CARB STEADY) Liqd     furosemide 40 MG tablet  Commonly known as:  LASIX     hydrochlorothiazide 25 MG tablet  Commonly known as:  HYDRODIURIL     oxyCODONE-acetaminophen 10-325 MG per tablet  Commonly known as:  PERCOCET      TAKE these medications       calcium carbonate 1250 MG tablet  Commonly known as:  OS-CAL - dosed in mg of elemental calcium  Take 1 tablet (500 mg of elemental calcium total) by mouth 2 (two) times daily with a meal.     carvedilol 12.5 MG tablet  Commonly known as:  COREG  Take 1 tablet (12.5 mg total) by mouth 2 (two) times daily with a meal.     esomeprazole 40 MG capsule  Commonly known as:  NEXIUM  Take 40 mg by mouth daily before breakfast.     insulin glargine 100 UNIT/ML injection  Commonly known as:  LANTUS  Inject 0.05 mLs (5 Units total) into the skin at bedtime.     isosorbide-hydrALAZINE 20-37.5 MG per tablet  Commonly known as:  BIDIL  Take 0.5 tablets by mouth 2 (two) times daily.      sodium bicarbonate 650 MG tablet  Take 1 tablet (650 mg total) by mouth daily.     triamcinolone 0.025 % cream  Commonly known as:  KENALOG  Apply 1 application topically 3 (three) times daily. Apply to entire body for itching       Allergies  Allergen Reactions  . Morphine And Related Rash  . Penicillins Rash       Follow-up Information   Follow up with HEWITT, Jenny Reichmann, MD. Schedule an appointment as soon as possible for a visit in 2 weeks.   Specialty:  Orthopedic Surgery   Contact information:   [address] [place] [address] [phone]       Schedule an appointment as soon as possible for a visit with [name], MD. (in 1 week after discharging patient from hospital)    Specialty:  Internal Medicine   Contact information:   [address] [place] [address] [phone]        The results of significant diagnostics from this hospitalization (including imaging, microbiology, ancillary and laboratory) are listed below for reference.  Significant Diagnostic Studies: Ct Chest Wo Contrast  06/21/2013   CLINICAL DATA:  37 year old female with shortness of breath and cough. Orthopnea.  EXAM: CT CHEST WITHOUT CONTRAST  TECHNIQUE: Multidetector CT imaging of the chest was performed following the standard protocol without IV contrast.  COMPARISON:  06/20/2013 and prior chest radiographs.  FINDINGS: Upper limits normal heart size noted.  Relative low density of the blood pool suggests anemia.  Moderate bilateral pleural effusions and small pericardial effusion noted.  Mild basilar atelectasis is noted.  There is no evidence of airspace disease, consolidation, suspicious nodule/ mass or endobronchial/endotracheal lesion.  No acute or suspicious bony abnormalities are identified.  The visualized upper abdomen is unremarkable.  A right PICC line is identified with tip in the lower SVC.  IMPRESSION: Moderate bilateral pleural effusions, small  pericardial effusion and bibasilar atelectasis.  Upper limits normal heart size and findings suggestive of anemia.  Right PICC line.   Electronically Signed   By: Hassan Rowan M.D.   On: 06/21/2013 17:16   Mr Foot Left Wo Contrast  06/24/2013   CLINICAL DATA:  Osteomyelitis of the foot. Ulcers. Diabetic foot ulcer with prior indications of the toes.  EXAM: MRI OF THE LEFT FOREFOOT WITHOUT CONTRAST  TECHNIQUE: Multiplanar, multisequence MR imaging was performed. No intravenous contrast was administered.  COMPARISON:  02/03/2013.  FINDINGS: There is a Y-shaped abscess in the soft tissues plantar to and interposed between the 3rd and 4th MTP joints. Abscess measures 40 mm x 26 mm x 20 mm. This appears to represent a sinus tract that communicates with ulceration and wound packing material is present between the 4th and 5th toes. Septic arthritis of the 3rd MTP joint is present with osteomyelitis of the distal 4th and 3rd distal metatarsals. Amputation of the phalanges of the great toe has been performed. There is a fracture of the 2nd metatarsal neck, and plain films would probably be useful to correlate with the MR. Given the proximity to the septic arthritis, there may be superimposed infection at the 2nd metatarsal head and neck associated with fracture. There is reactive marrow edema in the distal 5th metatarsal without definite osteomyelitis. Osteomyelitis of the proximal phalanx of the 3rd toe. The phalanges of the 4th toe have either been resected or under on osteolysis. Diffuse myositis of the foot is present, likely infectious. Reactive effusion in the midfoot.  Significantly, there is no osteomyelitis of the remaining 1st metatarsal or sesamoid bones.  IMPRESSION: 1. Osteomyelitis of the distal 3rd and 4th metatarsals with septic arthritis of the 3rd MTP joint an osteomyelitis on both sides of the joint. 2. Fracture of the 2nd metatarsal neck. Superimposed infection cannot be excluded based on proximity to 3rd  septic arthritis. 3. Amputation of the phalanges of the great toe. Absence of the phalanges of the 4th toe suggest prior amputation versus osteolysis. No recent plain film is available to correlate ; for reference, correlative plain films before or at the same time as MRI are almost always helpful in interpretation of the MRI. 4. Y-shaped abscess measuring 4 cm x 2.6 cm x 2.0 cm plantar to and interposed between the distal 3rd and 4th metatarsals. This probably communicates with on ulcer and represents a sinus tract.   Electronically Signed   By: Dereck Ligas M.D.   On: 06/24/2013 08:16   Dg Chest Port 1 View  06/23/2013   CLINICAL DATA:  Fever and chills  EXAM: PORTABLE CHEST - 1 VIEW  COMPARISON:  06/22/2013  FINDINGS: Stable right upper extremity PICC. Right pleural effusion has increased. Low volumes and bibasilar atelectasis. Cardiomegaly.  IMPRESSION: Stable cardiomegaly and low volumes. Increasing small right pleural effusion.   Electronically Signed   By: Maryclare Bean M.D.   On: 06/23/2013 10:10   Dg Chest Port 1 View  06/22/2013   CLINICAL DATA:  Shortness of breath and cough  EXAM: PORTABLE CHEST - 1 VIEW  COMPARISON:  06/20/2013  FINDINGS: A right-sided PICC line is now seen with catheter tip at the cavoatrial junction. The cardiac shadow is enlarged. The known pleural effusions are less well visualized on this exam due to the some are erect position. No focal infiltrate is seen.  IMPRESSION: PICC line as described.  Known pleural effusions are less well visualized.  Stable cardiomegaly.   Electronically Signed   By: Inez Catalina   On: 06/22/2013 08:06    Microbiology: Recent Results (from the past 240 hour(s))  CULTURE, BLOOD (ROUTINE X 2)     Status: None   Collection Time    06/23/13 10:30 AM      Result Value Range Status   Specimen Description BLOOD LEFT ARM   Final   Special Requests BOTTLES DRAWN AEROBIC ONLY 2CC   Final   Culture  Setup Time     Final   Value: 06/23/2013 15:41      Performed at [facility]   Culture     Final   Value: NO GROWTH 5 DAYS     Performed at [facility]   Report Status 06/29/2013 FINAL   Final  CULTURE, BLOOD (ROUTINE X 2)     Status: None   Collection Time    06/23/13 10:40 AM      Result Value Range Status   Specimen Description BLOOD LEFT HAND   Final   Special Requests BOTTLES DRAWN AEROBIC ONLY 5CC   Final   Culture  Setup Time     Final   Value: 06/23/2013 15:40     Performed at [facility]   Culture     Final   Value: NO GROWTH 5 DAYS     Performed at [facility]   Report Status 06/29/2013 FINAL   Final  SURGICAL PCR SCREEN     Status: None   Collection Time    06/24/13 12:53 PM      Result Value Range Status   MRSA, PCR NEGATIVE  NEGATIVE Final   Staphylococcus aureus NEGATIVE  NEGATIVE Final   Comment:            The Xpert SA Assay (FDA     approved for NASAL specimens     in patients over 42 years of age),     is one component of     a comprehensive surveillance     program.  Test performance has     been validated by [employer] for patients greater     than or equal to 82 year old.     It is not intended     to diagnose infection nor to     guide or monitor treatment.     Labs: Basic Metabolic Panel:  Recent Labs Lab 06/24/13 1243 06/25/13 0500 06/26/13 1058 06/27/13 0550 06/29/13 0555  NA 128* 129* 129* 131* 136  K 4.1 4.0 4.4 4.6 5.2*  CL 99 100 100 103 108  CO2 18* 18* 16* 18* 19  GLUCOSE 71 106* 110* 103*  121*  BUN 32* 33* 34* 37* 37*  CREATININE 3.06* 3.05* 2.86* 2.83* 2.12*  CALCIUM 7.2* 7.2* 7.4* 7.6* 7.9*   CBC:  Recent Labs Lab 06/24/13 1243 06/25/13 0500 06/26/13 1058 06/27/13 0550  WBC 22.3* 20.2* 14.8* 11.9*  NEUTROABS 19.7*  --   --   --   HGB 7.8* 8.9* 10.1* 9.3*  HCT 24.4* 26.7* 30.8* 29.4*  MCV 77.7* 76.5* 77.2* 78.4  PLT 282 292 348 312   BNP: BNP (last 3 results)  Recent Labs  02/07/13 0535 06/22/13 0500  PROBNP  16430.0* 55261.0*   CBG:  Recent Labs Lab 06/28/13 2100 06/29/13 0628 06/29/13 1133 06/29/13 1609 06/29/13 2119  GLUCAP 194* 109* 122* 162* 166*   Signed:  Lenoard Helbert  [facility] 06/30/2013, 10:30 AM

## 2013-07-01 ENCOUNTER — Inpatient Hospital Stay (HOSPITAL_COMMUNITY): Payer: Medicaid Other | Admitting: Physical Therapy

## 2013-07-01 ENCOUNTER — Inpatient Hospital Stay (HOSPITAL_COMMUNITY): Payer: Medicaid Other | Admitting: Occupational Therapy

## 2013-07-01 DIAGNOSIS — I739 Peripheral vascular disease, unspecified: Secondary | ICD-10-CM

## 2013-07-01 DIAGNOSIS — E1165 Type 2 diabetes mellitus with hyperglycemia: Secondary | ICD-10-CM

## 2013-07-01 DIAGNOSIS — R5381 Other malaise: Secondary | ICD-10-CM

## 2013-07-01 DIAGNOSIS — S88119A Complete traumatic amputation at level between knee and ankle, unspecified lower leg, initial encounter: Secondary | ICD-10-CM

## 2013-07-01 LAB — GLUCOSE, CAPILLARY
Glucose-Capillary: 129 mg/dL — ABNORMAL HIGH (ref 70–99)
Glucose-Capillary: 151 mg/dL — ABNORMAL HIGH (ref 70–99)
Glucose-Capillary: 167 mg/dL — ABNORMAL HIGH (ref 70–99)

## 2013-07-01 NOTE — Progress Notes (Signed)
Physical Therapy Session Note  Patient Details  Name: Karen [name] MRN: [id number] Date of Birth: [birth year]  Today's Date: 07/01/2013 Time: 1300-1345 Total Time: 45 minutes  Short Term Goals: Week 1:  PT Short Term Goal 1 (Week 1): Patient will be able to perform bed mobility with min-assist PT Short Term Goal 2 (Week 1): Patient will be able to perform Transfers with min-assist PT Short Term Goal 3 (Week 1): Patient will be able to perform wheel chair management with min-assist and prope x 150'  Precautions:  Precautions Precautions: Fall Precaution Comments: avoid prolonged knee or hip flexion surgical side, KI in place this eval Required Braces or Orthoses: Knee Immobilizer - Right Restrictions Weight Bearing Restrictions: No LLE Weight Bearing: Non weight bearing Pain: 2/10 L LE (BKA)  Therapeutic Activity:(30') trial of transfers sit<->stand inside parallel bars. Patient unable to perform after multiple attempts due to w/c seat too low. Transfers sit<->stand from elevated hospital bed into RW with S/min-A. Transfer w/c<->bed is Max-assist via squat pivot transfer. Wheel chair management:(15') propelling w/c x 80' inside hall way and propelling outdoors on brick patio with S/min-assist for maneuvering around objects.   Therapy/Group: Individual Therapy  Clearence Ped 07/01/2013, 7:47 PM

## 2013-07-01 NOTE — Evaluation (Signed)
Occupational Therapy Assessment and Plan  Patient Details  Name: Karen [name] MRN: [id number] Date of Birth: [birth year]  OT Diagnosis: acute pain and muscle weakness (generalized) Rehab Potential: Rehab Potential: Excellent ELOS: 14 days   Today's Date: 07/01/2013 Time: 0800-0930 Time Calculation (min): 90 min  Problem List:  Patient Active Problem List   Diagnosis Date Noted  . S/P BKA (below knee amputation) unilateral 06/30/2013  . Physical deconditioning 06/30/2013  . Foot osteomyelitis, left 06/24/2013  . Acute respiratory failure 06/22/2013  . CKD (chronic kidney disease) stage 3, GFR 30-59 ml/min due to DM 06/20/2013  . Cardiomyopathy, ischemic - EF 45-50% with inf WMA by 2D 02/05/13 02/06/2013  . Acute combined systolic and diastolic congestive heart failure 02/05/2013  . Acute renal failure 02/05/2013  . Essential hypertension 02/05/2013  . PAD (peripheral artery disease) 02/05/2013  . Type II or unspecified type diabetes mellitus with unspecified complication, uncontrolled   . Diabetic neuropathy   . Osteomyelitis of left great toe - S/P amputation 02/06/13 04/21/2011    Past Medical History:  Past Medical History  Diagnosis Date  . Hypertension   . Anemia   . History of blood transfusion     "last week" (02/02/2013)  . GERD (gastroesophageal reflux disease)   . Osteomyelitis of toe of left foot     "off and on since 2009; no OR" (02/02/2013)  . Renal failure     acute vs chronic/notes 02/02/2013  . Type II diabetes mellitus 1995  . Exertional shortness of breath     "recently; it's fluid" (02/02/2013)   Past Surgical History:  Past Surgical History  Procedure Laterality Date  . Cesarean section  10/18/2006  . Amputation Left 02/06/2013    Procedure: AMPUTATION LEFT GREAT TOE;  Surgeon: Wylene Simmer, MD;  Location: [place];  Service: Orthopedics;  Laterality: Left;  . Amputation Left 06/24/2013    Procedure: AMPUTATION BELOW KNEE ;  Surgeon: Wylene Simmer, MD;   Location: [place];  Service: Orthopedics;  Laterality: Left;    Assessment & Plan Clinical Impression: Patient is a 37 y.o. year old female with recent admission to the hospital on 06/20/2013 with left forefoot pain and drainage of wound as well as low-grade fever. Patient also noted increased cough and shortness of breath. CT of the chest showed moderate bilateral pleural effusions/acute CHF. Patient placed on intravenous Lasix therapy for diuresis. Echocardiogram with ejection fraction [id number] grade 3 diastolic dysfunction. Venous Doppler studies lower extremity showed no evidence of DVT. MRI of left foot showed osteomyelitis distal third and fourth metatarsals. 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 Doran Durand.  Patient transferred to CIR on 06/30/2013 .    Patient currently requires max with basic self-care skills secondary to muscle weakness.  Prior to hospitalization, patient could complete ADLS with independence.  She had a home health aide 2 hours a day to assist with home mangement tasks and some IADLs with.  Patient will benefit from skilled intervention to decrease level of assist with basic self-care skills and increase independence with basic self-care skills prior to discharge home with PRN assistance.  Anticipate patient will require intermittent supervision and follow up home health.  OT - End of Session Activity Tolerance: Tolerates 30+ min activity with multiple rests Endurance Deficit: Yes Endurance Deficit Description: Pt with signs of being tired after completing bathing and dressing tasks. OT Assessment Rehab Potential: Excellent Barriers to Discharge: Decreased caregiver support Barriers to Discharge Comments: Pt  does not have 24 hour supervision OT Patient demonstrates impairments in the following area(s): Balance;Endurance;Pain;Safety OT Basic ADL's Functional Problem(s): Grooming;Bathing;Dressing;Toileting OT Advanced ADL's  Functional Problem(s): Simple Meal Preparation OT Transfers Functional Problem(s): Toilet;Tub/Shower OT Plan OT Intensity: Minimum of 1-2 x/day, 45 to 90 minutes OT Frequency: 5 out of 7 days OT Duration/Estimated Length of Stay: 14 days OT Treatment/Interventions: Financial risk analyst;Discharge planning;Balance/vestibular training;Pain management;Patient/family education;Functional mobility training;Psychosocial support;Neuromuscular re-education;UE/LE Strength taining/ROM;Therapeutic Exercise;Therapeutic Activities;Self Care/advanced ADL retraining;Wheelchair propulsion/positioning;UE/LE Coordination activities OT Self Feeding Anticipated Outcome(s): NA pt independent OT Basic Self-Care Anticipated Outcome(s): supervision OT Toileting Anticipated Outcome(s): modified independent OT Bathroom Transfers Anticipated Outcome(s): modified independent OT Recommendation Patient destination: Home Follow Up Recommendations: Home health OT Equipment Recommended: 3 in 1 bedside comode;Tub/shower bench (drop arm commode possibly, depends on progress)  OT Evaluation Precautions/Restrictions  Precautions Precautions: Fall Precaution Comments: avoid prolonged knee or hip flexion surgical side, KI in place this eval Required Braces or Orthoses: Knee Immobilizer - Right Restrictions Weight Bearing Restrictions: No LLE Weight Bearing: Non weight bearing  Pain Pain Assessment Pain Assessment: 0-10 Pain Score: 2  (can get up to 8/10 but meds help) Pain Type: Surgical pain Pain Location: Leg Pain Orientation: Left Pain Descriptors / Indicators: Aching;Dull;Sharp Pain Onset: On-going Patients Stated Pain Goal: 2 Pain Intervention(s): Medication (See eMAR);Repositioned Multiple Pain Sites: No (has history of chronic LBP) Home Living/Prior Functioning Home Living Available Help at Discharge: Available PRN/intermittently Type of Home: House (Pt is planning to  move) Home Access: Stairs to enter Entrance Stairs-Number of Steps: 5 Entrance Stairs-Rails: None Home Layout: One level Additional Comments:  (looking to move to new rental with easy access)  Lives With: Alone Prior Function Level of Independence: Independent with basic ADLs;Independent with gait;Independent with transfers  Able to Take Stairs?: Yes Driving: No Vocation: On disability ADL  See FIM scale for details  Vision/Perception  Vision - History Baseline Vision: Wears glasses all the time Patient Visual Report: No change from baseline Vision - Assessment Vision Assessment: Vision not tested Perception Perception: Within Functional Limits Praxis Praxis: Intact  Cognition Overall Cognitive Status: Within Functional Limits for tasks assessed Orientation Level: Oriented X4 Memory: Appears intact Awareness: Appears intact Problem Solving: Appears intact Sensation  Motor  Motor Motor: Within Functional Limits Mobility  Bed Mobility Bed Mobility: Rolling Left;Rolling Right;Right Sidelying to Sit Rolling Right: 6: Modified independent (Device/Increase time);With rail Rolling Left: 6: Modified independent (Device/Increase time);With rail Right Sidelying to Sit: 4: Min assist;HOB elevated;With rails Supine to Sit: 4: Min assist;HOB flat Supine to Sit Details: Verbal cues for technique;Verbal cues for safe use of DME/AE Sitting - Scoot to Edge of Bed: 5: Supervision;With rail Scooting to [facility]: 6: Modified independent (Device/Increase time) Transfers Transfers: Sit to Stand Sit to Stand: 1: +1 Total assist;From toilet;With armrests Sit to Stand Details: Manual facilitation for weight bearing;Verbal cues for technique Stand to Sit: 2: Max assist;To chair/3-in-1 Stand to Sit Details (indicate cue type and reason): Verbal cues for technique;Verbal cues for safe use of DME/AE;Manual facilitation for placement Stand to Sit Details: Pt flopped down in the wheelchair without  squaring her hips up, reaching back, or controlling the descend.    Trunk/Postural Assessment  Cervical Assessment Cervical Assessment: Within Functional Limits Thoracic Assessment Thoracic Assessment: Within Functional Limits Lumbar Assessment Lumbar Assessment: Within Functional Limits Postural Control Postural Control: Within Functional Limits  Balance Static Sitting Balance Static Sitting - Balance Support: No upper extremity supported Static Sitting - Level of Assistance: 7: Independent  Static Standing Balance Static Standing - Balance Support: Right upper extremity supported;Left upper extremity supported Static Standing - Level of Assistance: 2: Max assist Extremity/Trunk Assessment RUE Assessment RUE Assessment: Within Functional Limits (strength 4/5 throughout) LUE Assessment LUE Assessment: Within Functional Limits (strength 4/5 throughout)  FIM:  FIM - Grooming Grooming Steps: Wash, rinse, dry face;Wash, rinse, dry hands;Oral care, brush teeth, clean dentures Grooming: 5: Supervision: safety issues or verbal cues FIM - Bathing Bathing Steps Patient Completed: Chest;Right Arm;Left Arm;Abdomen;Front perineal area;Right upper leg;Buttocks;Left upper leg;Right lower leg (including foot) Bathing: 4: Steadying assist (Pt performed lateral leans not sit to stand.) FIM - Upper Body Dressing/Undressing Upper body dressing/undressing steps patient completed: Thread/unthread right bra strap;Thread/unthread left bra strap;Hook/unhook bra;Thread/unthread right sleeve of pullover shirt/dresss;Thread/unthread left sleeve of pullover shirt/dress;Put head through opening of pull over shirt/dress;Pull shirt over trunk Upper body dressing/undressing: 5: Set-up assist to: Obtain clothing/put away FIM - Lower Body Dressing/Undressing Lower body dressing/undressing steps patient completed: Thread/unthread right underwear leg;Thread/unthread left underwear leg;Pull underwear  up/down;Thread/unthread right pants leg;Thread/unthread left pants leg;Pull pants up/down;Don/Doff right sock Lower body dressing/undressing: 4: Steadying Assist (Pt performed lateral leans not sit to stand.) FIM - Toileting Toileting: 1: Two helpers FIM - IT sales professional Transfer: 4: Supine > Sit: Min A (steadying Pt. > 75%/lift 1 leg);2: Bed > Chair or W/C: Max A (lift and lower assist) FIM - Radio producer Devices: Nurse, learning disability Transfers: 1-From toilet/BSC: Total A (helper does all/Pt. < 25%);1-To toilet/BSC: Total A (helper does all/Pt. < 25%)   Refer to Care Plan for Long Term Goals  Recommendations for other services: None  Discharge Criteria: Patient will be discharged from OT if patient refuses treatment 3 consecutive times without medical reason, if treatment goals not met, if there is a change in medical status, if patient makes no progress towards goals or if patient is discharged from hospital.  The above assessment, treatment plan, treatment alternatives and goals were discussed and mutually agreed upon: by patient  Pt worked on bathing and dressing sitting EOB during session.  Pt performing lateral leans side to side to wash her peri area and pull her underpants over her hips.  Performed stand pivot transfer from bed to 3:1 and back with total assist.  Pt transferred to wheelchair as well with max assist using the RW from an elevated bed.     Hommer Cunliffe OTR/L 07/01/2013, 11:53 AM

## 2013-07-01 NOTE — Progress Notes (Addendum)
Karen [name] is a 37 y.o. female [birth year] [id number]  Subjective: No complaints. Slept well. Feeling OK.  Objective: Vital signs in last 24 hours: Temp:  [97.5 F (36.4 C)-98.6 F (37 C)] 97.9 F (36.6 C) (10/11 0601) Pulse Rate:  [86-95] 86 (10/11 0601) Resp:  [18-20] 19 (10/11 0601) BP: (140-145)/(88-91) 140/89 mmHg (10/11 0601) SpO2:  [92 %-99 %] 94 % (10/11 0601) Weight:  [232 lb 8 oz (105.461 kg)-236 lb 1.8 oz (107.1 kg)] 232 lb 8 oz (105.461 kg) (10/11 0601) Weight change:  Last BM Date: 06/29/13  Intake/Output from previous day: 10/10 0701 - 10/11 0700 In: 950 [P.O.:950] Out: -  Last cbgs: CBG (last 3)   Recent Labs  06/30/13 1639 06/30/13 2044 07/01/13 0718  GLUCAP 138* 172* 121*     Physical Exam General: No apparent distress   Obese HEENT: moist mucosa Lungs: Normal effort. Lungs clear to auscultation, no crackles or wheezes. Cardiovascular: Regular rate and rhythm, no edema Abdomen: S/NT/ND; BS(+) Musculoskeletal:  No change from before Neurological: No new neurological deficits Wounds: N/A    Skin: clear Alert, cooperative. Somewhat sad   Lab Results: BMET    Component Value Date/Time   NA 137 06/30/2013 1000   K 5.3* 06/30/2013 1000   CL 109 06/30/2013 1000   CO2 20 06/30/2013 1000   GLUCOSE 148* 06/30/2013 1000   BUN 37* 06/30/2013 1000   CREATININE 2.14* 06/30/2013 1630   CALCIUM 8.1* 06/30/2013 1000   GFRNONAA 29* 06/30/2013 1630   GFRAA 33* 06/30/2013 1630   CBC    Component Value Date/Time   WBC 12.3* 06/30/2013 1630   RBC 3.67* 06/30/2013 1630   HGB 9.0* 06/30/2013 1630   HCT 28.9* 06/30/2013 1630   PLT 309 06/30/2013 1630   MCV 78.7 06/30/2013 1630   MCH 24.5* 06/30/2013 1630   MCHC 31.1 06/30/2013 1630   RDW 17.8* 06/30/2013 1630   LYMPHSABS 1.1 06/24/2013 1243   MONOABS 1.5* 06/24/2013 1243   EOSABS 0.1 06/24/2013 1243   BASOSABS 0.0 06/24/2013 1243    Studies/Results: No results found.  Medications: I have  reviewed the patient's current medications.  Assessment/Plan:  1. 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 DVT Prophylaxis/Anticoagulation: Subcutaneous heparin. Monitor platelet counts and any signs of bleeding  3. Pain Management: Oxycodone as needed. Monitor with increased mobility  4. Neuropsych: This patient is capable of making decisions on her own behalf.  5. Chronic anemia. Latest hemoglobin 8.9. Followup CBC. Latest hemoglobin prior to admission 8.6  6. Acute CHF/bronchitis. Lasix 80 mg twice daily. No complaints of shortness of breath. Followup chest x-ray. Strict I and O.'s  7. Diabetes mellitus with peripheral neuropathy. Hemoglobin A1c 6.6. Presently with sliding scale insulin. Patient on Lantus insulin 18 units each bedtime prior to admission. Check blood sugars a.c. and at bedtime and resume scheduled insulin as tolerated  8. Hypertension: Coreg 12.5 mg twice a day, Bidil 0.5 mg twice a day. Monitor with increased mobility  9. Chronic renal insufficiency. Baseline creatinine 3.58. Strict I and O.'s. Followup chemistries  10. GERD. Protonix     Length of stay, days: 1  Walker Kehr , MD 07/01/2013, 9:43 AM

## 2013-07-01 NOTE — Evaluation (Signed)
Physical Therapy Assessment and Plan  Patient Details  Name: Addalyne Ayotte MRN: [id number] Date of Birth: 07-27-1976  PT Diagnosis: Difficulty walking and Muscle weakness Rehab Potential:Good ELOS: 2 weeks  Today's Date: 07/01/2013 Time: 1000-1100 Time Calculation (min): 60 min  Problem List:  Patient Active Problem List   Diagnosis Date Noted  . S/P BKA (below knee amputation) unilateral 06/30/2013  . Physical deconditioning 06/30/2013  . Foot osteomyelitis, left 06/24/2013  . Acute respiratory failure 06/22/2013  . CKD (chronic kidney disease) stage 3, GFR 30-59 ml/min due to DM 06/20/2013  . Cardiomyopathy, ischemic - EF 45-50% with inf WMA by 2D 02/05/13 02/06/2013  . Acute combined systolic and diastolic congestive heart failure 02/05/2013  . Acute renal failure 02/05/2013  . Essential hypertension 02/05/2013  . PAD (peripheral artery disease) 02/05/2013  . Type II or unspecified type diabetes mellitus with unspecified complication, uncontrolled   . Diabetic neuropathy   . Osteomyelitis of left great toe - S/P amputation 02/06/13 04/21/2011    Past Medical History:  Past Medical History  Diagnosis Date  . Hypertension   . Anemia   . History of blood transfusion     "last week" (02/02/2013)  . GERD (gastroesophageal reflux disease)   . Osteomyelitis of toe of left foot     "off and on since 2009; no OR" (02/02/2013)  . Renal failure     acute vs chronic/notes 02/02/2013  . Type II diabetes mellitus 1995  . Exertional shortness of breath     "recently; it's fluid" (02/02/2013)   Past Surgical History:  Past Surgical History  Procedure Laterality Date  . Cesarean section  10/18/2006  . Amputation Left 02/06/2013    Procedure: AMPUTATION LEFT GREAT TOE;  Surgeon: Wylene Simmer, MD;  Location: [place];  Service: Orthopedics;  Laterality: Left;  . Amputation Left 06/24/2013    Procedure: AMPUTATION BELOW KNEE ;  Surgeon: Wylene Simmer, MD;  Location: [place];  Service: Orthopedics;   Laterality: Left;    Assessment & Plan Clinical Impression: Yalitza Foxworthy is a 37 y.o. right-handed female with history of diabetes mellitus with peripheral neuropathy chronic renal insufficiency with baseline creatinine 3.58, chronic anemia as well as amputation left great toe 02/06/2013. Admitted 06/20/2013 with left forefoot pain and drainage of wound as well as low-grade fever. Patient also noted increased cough and shortness of breath. CT of the chest showed moderate bilateral pleural effusions/acute CHF. Patient placed on intravenous Lasix therapy for diuresis. Echocardiogram with ejection fraction [id number] grade 3 diastolic dysfunction. Venous Doppler studies lower extremity showed no evidence of DVT. MRI of left foot showed osteomyelitis distal third and fourth metatarsals. 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 Doran Durand. Postoperative pain management. Subcutaneous heparin added for DVT prophylaxis. Physical therapy evaluation completed 06/25/2013 with recommendations of physical medicine rehabilitation consult to consider inpatient rehabilitation services.  Patient transferred to CIR on 06/30/2013 .   Patient currently requires max with mobility secondary to muscle weakness and muscle joint tightness.  Prior to hospitalization, patient was independent  with mobility and lived with Alone in a House (Pt is planning to move) home.  Home access is 5Stairs to enter.  Patient will benefit from skilled PT intervention to maximize safe functional mobility, minimize fall risk and decrease caregiver burden for planned discharge home with 24 hour supervision.  Anticipate patient will benefit from follow up [place] at discharge.  PT Evaluation Precautions/Restrictions Precautions Precautions: Fall Required Braces or Orthoses: Knee  Immobilizer - Right Restrictions Weight Bearing Restrictions: Yes LLE Weight Bearing: Non weight bearing General Chart Reviewed:  Yes Family/Caregiver Present: No  Pain Pain Assessment Pain Assessment: 0-10 Pain Score: 2  (can get up to 8/10 but meds help) Pain Type: Surgical pain Pain Location: Leg Pain Orientation: Left Pain Descriptors / Indicators: Aching;Dull;Sharp Pain Onset: On-going Patients Stated Pain Goal: 2 Pain Intervention(s): Medication (See eMAR);Repositioned Multiple Pain Sites: No (has history of chronic LBP) Home Living/Prior Functioning Home Living Available Help at Discharge: Available 24 hours/day (church family are very supportive) Type of Home: House Home Access: Stairs to enter Technical brewer of Steps: 5 Entrance Stairs-Rails: None Home Layout: One level Additional Comments:  (looking to move to new rental with easy access)  Lives With:  (God mother checks on patient and her 3 children.) Prior Function Level of Independence: Independent with basic ADLs;Independent with gait;Independent with transfers  Able to Take Stairs?: Yes Driving: No Vision/Perception  Vision - History Baseline Vision: Wears glasses all the time Patient Visual Report: No change from baseline  Cognition Overall Cognitive Status: Within Functional Limits for tasks assessed Orientation Level: Oriented X4 Sensation Coordination Gross Motor Movements are Fluid and Coordinated: Yes Heel Shin Test: deferred (L BKA) Motor  Motor Motor: Within Functional Limits  Mobility Bed Mobility Bed Mobility: Rolling Left;Rolling Right;Right Sidelying to Sit Rolling Right: 6: Modified independent (Device/Increase time);With rail Rolling Left: 6: Modified independent (Device/Increase time);With rail Right Sidelying to Sit: 4: Min assist;HOB elevated;With rails Sitting - Scoot to Edge of Bed: 5: Supervision;With rail Scooting to [facility]: 6: Modified independent (Device/Increase time) Transfers Transfers: Yes Sit to Stand: 2: Max assist;From bed (from w/c and lowest position of hospital bed) Stand to Sit: 4: Min  assist Stand Pivot Transfers: 2: Max Teacher, English as a foreign language Transfers: 2: Max assist LLE Assessment LLE Assessment: Exceptions to WFL (L BKA with knee immobilizer, mmt; hip 3/5)  FIM:  (Please see patient chart for details of Evaluation items not entered into this note)  Refer to Care Plan for Long Term Goals  Recommendations for other services: None  Discharge Criteria: Patient will be discharged from PT if patient refuses treatment 3 consecutive times without medical reason, if treatment goals not met, if there is a change in medical status, if patient makes no progress towards goals or if patient is discharged from hospital.  The above assessment, treatment plan, treatment alternatives and goals were discussed and mutually agreed upon: by patient  Clearence Ped 07/01/2013, 7:40 PM

## 2013-07-02 DIAGNOSIS — E1149 Type 2 diabetes mellitus with other diabetic neurological complication: Secondary | ICD-10-CM

## 2013-07-02 DIAGNOSIS — N179 Acute kidney failure, unspecified: Secondary | ICD-10-CM

## 2013-07-02 DIAGNOSIS — R609 Edema, unspecified: Secondary | ICD-10-CM

## 2013-07-02 DIAGNOSIS — E1142 Type 2 diabetes mellitus with diabetic polyneuropathy: Secondary | ICD-10-CM

## 2013-07-02 LAB — GLUCOSE, CAPILLARY: Glucose-Capillary: 138 mg/dL — ABNORMAL HIGH (ref 70–99)

## 2013-07-02 MED ORDER — TORSEMIDE 100 MG PO TABS
100.0000 mg | ORAL_TABLET | Freq: Every day | ORAL | Status: DC
  Administered 2013-07-02 – 2013-07-04 (×3): 100 mg via ORAL
  Filled 2013-07-02 (×13): qty 1

## 2013-07-02 NOTE — Progress Notes (Signed)
Karen [name] is a 37 y.o. female [birth year] [id number]  Subjective: C/o fluid retention. Slept well. Feeling OK.  Objective: Vital signs in last 24 hours: Temp:  [97.6 F (36.4 C)-98 F (36.7 C)] 98 F (36.7 C) (10/12 0500) Pulse Rate:  [60-93] 93 (10/12 0500) Resp:  [20] 20 (10/12 0500) BP: (150-159)/(99-100) 150/99 mmHg (10/12 0500) SpO2:  [93 %-97 %] 93 % (10/12 0500) Weight change:  Last BM Date: 06/29/13  Intake/Output from previous day: 10/11 0701 - 10/12 0700 In: 730 [P.O.:730] Out: -  Last cbgs: CBG (last 3)   Recent Labs  07/01/13 1623 07/01/13 2047 07/02/13 0723  GLUCAP 151* 167* 136*     Physical Exam General: No apparent distress   Obese HEENT: moist mucosa Lungs: Normal effort. Lungs clear to auscultation, no crackles or wheezes. Cardiovascular: Regular rate and rhythm, no edema Abdomen: S/NT/ND; BS(+) Musculoskeletal:  No change from before Bfeet with 1+ edema Neurological: No new neurological deficits Wounds: N/A    Skin: clear Alert, cooperative. Somewhat sad   Lab Results: BMET    Component Value Date/Time   NA 137 06/30/2013 1000   K 5.3* 06/30/2013 1000   CL 109 06/30/2013 1000   CO2 20 06/30/2013 1000   GLUCOSE 148* 06/30/2013 1000   BUN 37* 06/30/2013 1000   CREATININE 2.14* 06/30/2013 1630   CALCIUM 8.1* 06/30/2013 1000   GFRNONAA 29* 06/30/2013 1630   GFRAA 33* 06/30/2013 1630   CBC    Component Value Date/Time   WBC 12.3* 06/30/2013 1630   RBC 3.67* 06/30/2013 1630   HGB 9.0* 06/30/2013 1630   HCT 28.9* 06/30/2013 1630   PLT 309 06/30/2013 1630   MCV 78.7 06/30/2013 1630   MCH 24.5* 06/30/2013 1630   MCHC 31.1 06/30/2013 1630   RDW 17.8* 06/30/2013 1630   LYMPHSABS 1.1 06/24/2013 1243   MONOABS 1.5* 06/24/2013 1243   EOSABS 0.1 06/24/2013 1243   BASOSABS 0.0 06/24/2013 1243    Studies/Results: No results found.  Medications: I have reviewed the patient's current medications.  Assessment/Plan:  1. Left BKA  secondary to osteomyelitis 06/24/2013  2. DVT Prophylaxis/Anticoagulation: Subcutaneous heparin. Monitor platelet counts and any signs of bleeding  3. Pain Management: Oxycodone as needed. Monitor with increased mobility  4. Neuropsych: This patient is capable of making decisions on her own behalf.  5. Chronic anemia. Latest hemoglobin 8.9. Followup CBC. Latest hemoglobin prior to admission 8.6  6. Acute CHF/bronchitis. Lasix 80 mg twice daily. No complaints of shortness of breath. Followup chest x-ray. Strict I and O.'s  7. Diabetes mellitus with peripheral neuropathy. Hemoglobin A1c 6.6. Presently with sliding scale insulin. Patient on Lantus insulin 18 units each bedtime prior to admission. Check blood sugars a.c. and at bedtime and resume scheduled insulin as tolerated  8. Hypertension: Coreg 12.5 mg twice a day, Bidil 0.5 mg twice a day. Monitor with increased mobility  9. Chronic renal insufficiency. Baseline creatinine 3.58. Strict I and O.'s. Followup chemistries  10. GERD. Protonix 11. Fluid retention-worse. D/c Furosemide. Start Demadex. BMET on Monday     Length of stay, days: 2  Walker Kehr , MD 07/02/2013, 9:02 AM

## 2013-07-02 NOTE — Progress Notes (Signed)
CSW referred to assist this 37 year old female with short term SNF. There is a referral for CIR - [facility] and CSW spoke with Darra Lis,, RN  [facility] for [facility]. She plans to accept patient at [facility] when medically stable.  Patient does not wish SNF placement.  CSW will sign off but will be available to assist if needed.  Lorie Phenix. [place], [place]

## 2013-07-03 ENCOUNTER — Inpatient Hospital Stay (HOSPITAL_COMMUNITY): Payer: Medicaid Other | Admitting: Physical Therapy

## 2013-07-03 ENCOUNTER — Inpatient Hospital Stay (HOSPITAL_COMMUNITY): Payer: Medicaid Other

## 2013-07-03 DIAGNOSIS — S88119A Complete traumatic amputation at level between knee and ankle, unspecified lower leg, initial encounter: Secondary | ICD-10-CM

## 2013-07-03 DIAGNOSIS — I739 Peripheral vascular disease, unspecified: Secondary | ICD-10-CM

## 2013-07-03 DIAGNOSIS — Z89519 Acquired absence of unspecified leg below knee: Secondary | ICD-10-CM

## 2013-07-03 DIAGNOSIS — E1165 Type 2 diabetes mellitus with hyperglycemia: Secondary | ICD-10-CM

## 2013-07-03 DIAGNOSIS — L98499 Non-pressure chronic ulcer of skin of other sites with unspecified severity: Secondary | ICD-10-CM

## 2013-07-03 LAB — CBC WITH DIFFERENTIAL/PLATELET
Basophils Absolute: 0 10*3/uL (ref 0.0–0.1)
Basophils Relative: 0 % (ref 0–1)
Eosinophils Absolute: 0.3 10*3/uL (ref 0.0–0.7)
HCT: 26.5 % — ABNORMAL LOW (ref 36.0–46.0)
Hemoglobin: 8.4 g/dL — ABNORMAL LOW (ref 12.0–15.0)
Lymphocytes Relative: 25 % (ref 12–46)
MCHC: 31.7 g/dL (ref 30.0–36.0)
Monocytes Absolute: 0.8 10*3/uL (ref 0.1–1.0)
Monocytes Relative: 7 % (ref 3–12)
Neutro Abs: 6.9 10*3/uL (ref 1.7–7.7)
Platelets: 279 10*3/uL (ref 150–400)
WBC: 10.6 10*3/uL — ABNORMAL HIGH (ref 4.0–10.5)

## 2013-07-03 LAB — GLUCOSE, CAPILLARY
Glucose-Capillary: 114 mg/dL — ABNORMAL HIGH (ref 70–99)
Glucose-Capillary: 168 mg/dL — ABNORMAL HIGH (ref 70–99)

## 2013-07-03 LAB — COMPREHENSIVE METABOLIC PANEL
ALT: 44 U/L — ABNORMAL HIGH (ref 0–35)
AST: 44 U/L — ABNORMAL HIGH (ref 0–37)
BUN: 45 mg/dL — ABNORMAL HIGH (ref 6–23)
CO2: 22 mEq/L (ref 19–32)
Chloride: 101 mEq/L (ref 96–112)
Creatinine, Ser: 2.46 mg/dL — ABNORMAL HIGH (ref 0.50–1.10)
GFR calc non Af Amer: 24 mL/min — ABNORMAL LOW (ref >90)
Potassium: 4.5 mEq/L (ref 3.5–5.1)
Sodium: 132 mEq/L — ABNORMAL LOW (ref 135–145)
Total Bilirubin: 0.2 mg/dL — ABNORMAL LOW (ref 0.3–1.2)
Total Protein: 6.3 g/dL (ref 6.0–8.3)

## 2013-07-03 LAB — BASIC METABOLIC PANEL
BUN: 45 mg/dL — ABNORMAL HIGH (ref 6–23)
CO2: 23 mEq/L (ref 19–32)
Calcium: 8.8 mg/dL (ref 8.4–10.5)
Creatinine, Ser: 2.47 mg/dL — ABNORMAL HIGH (ref 0.50–1.10)
Glucose, Bld: 149 mg/dL — ABNORMAL HIGH (ref 70–99)

## 2013-07-03 NOTE — Progress Notes (Signed)
Social Work  Social Work Assessment and Plan  Patient Details  Name: Karen [name] MRN: [id number] Date of Birth: March 21, 1976  Today's Date: 07/03/2013  Problem List:  Patient Active Problem List   Diagnosis Date Noted  . Hx of BKA 07/03/2013  . S/P BKA (below knee amputation) unilateral 06/30/2013  . Physical deconditioning 06/30/2013  . Foot osteomyelitis, left 06/24/2013  . Acute respiratory failure 06/22/2013  . CKD (chronic kidney disease) stage 3, GFR 30-59 ml/min due to DM 06/20/2013  . Cardiomyopathy, ischemic - EF 45-50% with inf WMA by 2D 02/05/13 02/06/2013  . Acute combined systolic and diastolic congestive heart failure 02/05/2013  . Acute renal failure 02/05/2013  . Essential hypertension 02/05/2013  . PAD (peripheral artery disease) 02/05/2013  . Type II or unspecified type diabetes mellitus with unspecified complication, uncontrolled   . Diabetic neuropathy   . Osteomyelitis of left great toe - S/P amputation 02/06/13 04/21/2011   Past Medical History:  Past Medical History  Diagnosis Date  . Hypertension   . Anemia   . History of blood transfusion     "last week" (02/02/2013)  . GERD (gastroesophageal reflux disease)   . Osteomyelitis of toe of left foot     "off and on since 2009; no OR" (02/02/2013)  . Renal failure     acute vs chronic/notes 02/02/2013  . Type II diabetes mellitus 1995  . Exertional shortness of breath     "recently; it's fluid" (02/02/2013)   Past Surgical History:  Past Surgical History  Procedure Laterality Date  . Cesarean section  10/18/2006  . Amputation Left 02/06/2013    Procedure: AMPUTATION LEFT GREAT TOE;  Surgeon: Wylene Simmer, MD;  Location: [facility];  Service: Orthopedics;  Laterality: Left;  . Amputation Left 06/24/2013    Procedure: AMPUTATION BELOW KNEE ;  Surgeon: Wylene Simmer, MD;  Location: [facility];  Service: Orthopedics;  Laterality: Left;   Social History:  reports that she quit smoking about 8 years ago. Her smoking use  included Cigarettes. She has a 30 pack-year smoking history. She has never used smokeless tobacco. She reports that she does not drink alcohol or use illicit drugs.  Family / Support Systems Marital Status: Single Patient Roles: Parent Children: 3 younger children living at home (ages 43 yrs-twins and 6 yrs) plus an adult child out of the home Other Supports: godmother, Netta Neat @ ([phone] Anticipated Caregiver: self, Tammy and aunt per pt's report to [facility], however, she tells this SW that her supports are her godmother and "church family" Ability/Limitations of Caregiver: Godmother and aunt do errands and driving.  They will try to have someone stay with patient during the day. Godmother drives a school bus M-F. Caregiver Availability: Intermittent (Has in home health 2.8 hrs a day with [facility].) Family Dynamics: pt notes parents are both deceased and she has little contact with siblings.    Social History Preferred language: English Religion: Holiness/Pentecostal Cultural Background: NA Education: 12 grade (left due to pregnancy);  questioned about chart note reporting h/o learning disability leading to SSD approval, however, pt cannot elaborate on this "My mama filled out a form for the school that's all I know" Read: Yes Write: Yes Employment Status: Disabled Freight forwarder Issues: none Guardian/Conservator: pt able to make decision on her own behalf per MD   Abuse/Neglect Physical Abuse: Denies Verbal Abuse: Denies Sexual Abuse: Denies Exploitation of patient/patient's resources: Denies Self-Neglect: Denies  Emotional Status Pt's affect, behavior adn adjustment status:  Pt lying in bed with eyes closed, however, opens quickly upon my entry and is agreeable to interview.  She does remaining lying in bed on her side and occasionally closes eyes.  Offers very minimal answers to questions and does not engage very well with this SW.  Flat affect.  Admits  fatigue with today's therapies.  Denies any concerns about d/c other than making sure landlord gets a ramp put in place.  Denies any s/s of emotional distress - will monitor as very difficult to engage with pt upon this initial assessment.  Will also offer peer visits from amputee support group if interested. Recent Psychosocial Issues: declining health and require personal care services aide all while have 3 children still at home with her Pyschiatric History: None Substance Abuse History: None  Patient / Family Perceptions, Expectations & Goals Pt/Family understanding of illness & functional limitations: pt with very basic understanding of need for BKA due to non-healing wound (beginning with toe amputation).  Basic understanding of purpose for  Premorbid pt/family roles/activities: purpose for CIR therapies Anticipated changes in roles/activities/participation: pt may require 24/7 assistance which will significantly affect the d/c plan as no one identified as available to provide this for her at home.  Godmother to increase her support as she is able. Pt/family expectations/goals: Pt goals are to regain some level of independence even if at a wc level  [employer]: None Premorbid Home Care/DME Agencies: Other (Comment) (Earth [employer] manager for [employer];  Haematologist for [facility]) Transportation available at discharge: yes - intermittently - may need to review to [facility] transport services Resource referrals recommended: Support group (specify);Psychology  Discharge Planning Living Arrangements: Children Support Systems: Children;Friends/neighbors;Church/faith community Type of Residence: Private residence Insurance Resources: Medicaid (specify county) Financial Resources: SSI Financial Screen Referred: No Living Expenses: Education officer, community Management: Patient Does the patient have any problems obtaining your medications?: No Home Management: pt and children Patient/Family  Preliminary Plans: Pt hopes to have either a ramp placed at current home or be able to secure another home for rent which has a level entry.  Hopes to reach mod i w/c goals to return home with children and intermittent support of godmother Barriers to Discharge: Family Support;Self care;Steps;Finances Social Work Anticipated Follow Up Needs: HH/OP;Support Group Expected length of stay: 7-10 days  Clinical Impression Young woman (and mother) here following BKA.  Lying in bed and limited conversation but does answer questions appropriately but no elaboration without encouragement.  Limited supports at home and no 24/7 support.  Concern with functional levels she might realistically meet as well as accessible housing.  Needs to have either ramp built on current rental home vs. Move to another rental home.  Will need to follow closely for d/c planning issues.  Wynette Jersey 07/03/2013, 5:12 PM

## 2013-07-03 NOTE — Progress Notes (Signed)
[facility] Individual Statement of Services  Patient Name:  Karen [name]  Date:  07/03/2013  Welcome to the [facility].  Our goal is to provide you with an individualized program based on your diagnosis and situation, designed to meet your specific needs.  With this comprehensive rehabilitation program, you will be expected to participate in at least 3 hours of rehabilitation therapies Monday-Friday, with modified therapy programming on the weekends.  Your rehabilitation program will include the following services:  Physical Therapy (PT), Occupational Therapy (OT), 24 hour per day rehabilitation nursing, Case Management (Social Worker), Rehabilitation Medicine, Nutrition Services and Pharmacy Services  Weekly team conferences will be held on Tuesdays to discuss your progress.  Your Social Worker will talk with you frequently to get your input and to update you on team discussions.  Team conferences with you and your family in attendance may also be held.  Expected length of stay: 2 weeks  Overall anticipated outcome: modified independent w/c level  Depending on your progress and recovery, your program may change. Your Social Worker will coordinate services and will keep you informed of any changes. Your Social Worker's name and contact numbers are listed  below.  The following services may also be recommended but are not provided by the [facility] will be made to provide these services after discharge if needed.  Arrangements include referral to agencies that provide these services.  Your insurance has been verified to be:  Medicaid Your primary doctor is:  Dr. Rica Records ([place])  Pertinent information will be shared with your doctor and your insurance company.  Social Worker:  [place], [place] or ([phone]   Information discussed with and copy given to patient by: Lennart Pall, 07/03/2013, 4:34 PM

## 2013-07-03 NOTE — Progress Notes (Signed)
INITIAL NUTRITION ASSESSMENT  DOCUMENTATION CODES Per approved criteria  -Obesity Unspecified   INTERVENTION: Continue Nepro Shakes PO BID. Encourage PO intake. RD to continue to follow nutrition care plan.  NUTRITION DIAGNOSIS: Inadequate oral intake related to poor appetite/nausea as evidenced by pt's report of eating 1 meal daily for past 9 months and recent weight loss.  Goal: Pt to meet >/= 90% of their estimated nutrition needs   Monitor:  PO intake, weight trend, labs  Reason for Assessment: Malnutrition Screening Tool  37 y.o. female  Admitting Dx: Hx of BKA  ASSESSMENT: PMHx significant for hypertension, GERD, renal failure, and type 2 diabetes. Admitted to acute hospital on 9/30 with L foot pain, drainage and fever. 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 Admitted to inpatient rehab on 10/10.  On admission, pt reported that she has been eating poorly for the past 9 months due to nausea and decreased appetite. She states that most days she only eats 1 meal and she has been drinking Nepro Shakes occasionally. Pt reports her usual body weight is 250 lbs, weighed 219 lb on admission to acute rehab; attributes weight loss is partially related to poor food intake and partially caused by fluid loss.   Underwent BKA on 10/4. Current wt is 233 lb. Weight on admission to acute hospital was 219 lb, suspect current weight elevated likely 2/2 fluid status.  She reports that she is currently eating fair. Currently ordered for Nepro Shake PO BID. Pt was receiving Glucerna Shakes in the hospital. She states that she doesn't really have a preference for either supplement, will drink either, as scheduled.  Height: Ht Readings from Last 1 Encounters:  06/30/13 5' 6.5" (1.689 m)    Weight: Wt Readings from Last 1 Encounters:  07/03/13 233 lb 4 oz (105.8 kg)    Ideal Body Weight: 122 lbs (adjusted for BKA)  % Ideal Body Weight: 191%  Wt Readings from Last 10 Encounters:  07/03/13 233 lb 4 oz  (105.8 kg)  06/30/13 235 lb 0.2 oz (106.6 kg)  06/30/13 235 lb 0.2 oz (106.6 kg)  02/10/13 251 lb 12.8 oz (114.216 kg)  02/10/13 251 lb 12.8 oz (114.216 kg)  04/23/11 231 lb (104.781 kg)    Usual Body Weight: 250 lbs  % Usual Body Weight: 93%  BMI:  39.5 - adjusted for BKA (Obese Class II)  Estimated Nutritional Needs: Kcal: 1900-2100 Protein: 80-90 grams Fluid: 1.9-2.1 L/day fluid restriction  Skin:  non-pitting RLE and LLE edema L leg incision (amputation site)  Diet Order: Carb Control Medium (1600-2000)  EDUCATION NEEDS: -Education needs addressed on 10/1 re: CHF nutrition therapy   Intake/Output Summary (Last 24 hours) at 07/03/13 1144 Last data filed at 07/03/13 0833  Gross per 24 hour  Intake   1080 ml  Output      0 ml  Net   1080 ml    Last BM: 10/9   Labs:   Recent Labs Lab 06/30/13 1000 06/30/13 1630 07/03/13 0500 07/03/13 0910  NA 137  --  132* 132*  K 5.3*  --  4.5 4.7  CL 109  --  101 101  CO2 20  --  22 23  BUN 37*  --  45* 45*  CREATININE 2.08* 2.14* 2.46* 2.47*  CALCIUM 8.1*  --  8.7 8.8  GLUCOSE 148*  --  136* 149*    CBG (last 3)   Recent Labs  07/02/13 1614 07/02/13 2029 07/03/13 0724  GLUCAP 138* 127* 114*  Scheduled Meds: . calcium carbonate  1 tablet Oral BID WC  . carvedilol  12.5 mg Oral BID WC  . docusate sodium  100 mg Oral BID  . feeding supplement (NEPRO CARB STEADY)  237 mL Oral BID BM  . heparin  5,000 Units Subcutaneous Q8H  . insulin aspart  0-9 Units Subcutaneous TID WC  . insulin glargine  5 Units Subcutaneous QHS  . isosorbide-hydrALAZINE  0.5 tablet Oral BID  . pantoprazole  40 mg Oral Q1200  . senna  2 tablet Oral BID  . sodium bicarbonate  650 mg Oral Daily  . sodium chloride  10-40 mL Intracatheter Q12H  . torsemide  100 mg Oral Daily  . triamcinolone  1 application Topical TID    Continuous Infusions:   Past Medical History  Diagnosis Date  . Hypertension   . Anemia   . History of  blood transfusion     "last week" (02/02/2013)  . GERD (gastroesophageal reflux disease)   . Osteomyelitis of toe of left foot     "off and on since 2009; no OR" (02/02/2013)  . Renal failure     acute vs chronic/notes 02/02/2013  . Type II diabetes mellitus 1995  . Exertional shortness of breath     "recently; it's fluid" (02/02/2013)    Past Surgical History  Procedure Laterality Date  . Cesarean section  10/18/2006  . Amputation Left 02/06/2013    Procedure: AMPUTATION LEFT GREAT TOE;  Surgeon: Wylene Simmer, MD;  Location: [facility];  Service: Orthopedics;  Laterality: Left;  . Amputation Left 06/24/2013    Procedure: AMPUTATION BELOW KNEE ;  Surgeon: Wylene Simmer, MD;  Location: [facility];  Service: Orthopedics;  Laterality: Left;    Inda Coke MS, RD, LDN Pager: [phone] After-hours pager: [phone]

## 2013-07-03 NOTE — Progress Notes (Signed)
Patient information reviewed and entered into eRehab system by Noor Witte, RN, CRRN, PPS Coordinator.  Information including medical coding and functional independence measure will be reviewed and updated through discharge.    

## 2013-07-03 NOTE — Progress Notes (Signed)
Physical Therapy Session Note  Patient Details  Name: Karen [name] MRN: [id number] Date of Birth: 1975-11-19  Today's Date: 07/03/2013 Time: 1035-1130 Time Calculation (min): 55 min  Short Term Goals: Week 1:  PT Short Term Goal 1 (Week 1): Patient will be able to perform bed mobility with min-assist PT Short Term Goal 2 (Week 1): Patient will be able to perform Transfers with min-assist PT Short Term Goal 3 (Week 1): Patient will be able to perform wheel chair management with min-assist and prope x 150'  Skilled Therapeutic Interventions/Progress Updates:    Session focused on transfer training. Squat pivot transfer (after failed stand pivot) to/from bedside commode over toilet in bathroom with wall railing performed with +2 assist after pt unable to clear buttocks with one person max/total assist. Mod/Max verbal cues for sequencing and bil. UE placement. Scoot transfers (level) wheelchair <> mat with min/mod assist and min verbal cues. Pt needs cues and assist to keep upper trunk anteriorly placed as she tends to try to lean back and pull herself which is typically unsuccessful. Worked on wheelchair set-up and management of parts, mod verbal cues and assist. Sit <> stands from elevated mat x 5 reps with min assist. Pt a little inconsistent with need for assist during transfers, unclear if due to fatigue, difficulty sequencing, or anxiety.   Discussed BKA positioning to avoid knee flexion contractures. Discussed pressure relief in wheelchair, pt demonstrated x 5 reps lateral leans.   Therapy Documentation Precautions:  Precautions Precautions: Fall Precaution Comments: avoid prolonged knee or hip flexion surgical side, KI in place this eval Required Braces or Orthoses: Knee Immobilizer - Right Restrictions Weight Bearing Restrictions: Yes LLE Weight Bearing: Non weight bearing Pain: Pain Assessment Pain Assessment: No/denies pain   See FIM for current functional  status  Therapy/Group: Individual Therapy  [name] 07/03/2013, 11:51 AM

## 2013-07-03 NOTE — Progress Notes (Signed)
Subjective/Complaints: Had a reasonable weekend. Right leg sore, but wearing shrinker. No new complaints.  A 12 point review of systems has been performed and if not noted above is otherwise negative.  Objective: Vital Signs: Blood pressure 132/70, pulse 90, temperature 98.3 F (36.8 C), temperature source Oral, resp. rate 19, height 5' 6.5" (1.689 m), weight 105.8 kg (233 lb 4 oz), last menstrual period 05/22/2013, SpO2 100.00%. No results found.  Recent Labs  06/30/13 1630 07/03/13 0500  WBC 12.3* 10.6*  HGB 9.0* 8.4*  HCT 28.9* 26.5*  PLT 309 279    Recent Labs  06/30/13 1000 06/30/13 1630 07/03/13 0500  NA 137  --  132*  K 5.3*  --  4.5  CL 109  --  101  GLUCOSE 148*  --  136*  BUN 37*  --  45*  CREATININE 2.08* 2.14* 2.46*  CALCIUM 8.1*  --  8.7   CBG (last 3)   Recent Labs  07/02/13 1614 07/02/13 2029 07/03/13 0724  GLUCAP 138* 127* 114*    Wt Readings from Last 3 Encounters:  07/03/13 105.8 kg (233 lb 4 oz)  06/30/13 106.6 kg (235 lb 0.2 oz)  06/30/13 106.6 kg (235 lb 0.2 oz)    Physical Exam:  Constitutional: She is oriented to person, place, and time. She appears well-developed and well-nourished. obese  HENT:  Head: Normocephalic and atraumatic.  Right Ear: External ear normal.  Left Ear: External ear normal.  Eyes: Conjunctivae and EOM are normal. Pupils are equal, round, and reactive to light.  Neck: Normal range of motion. Neck supple. No JVD present. No tracheal deviation present. No thyromegaly present.  Cardiovascular: Normal rate and regular rhythm.  No murmur heard.  Pulmonary/Chest: No respiratory distress. She has no wheezes. She has no rales.  Decreased breath sounds at the bases but clear to auscultation.  Abdominal: Soft. Bowel sounds are normal. She exhibits no distension.  Musculoskeletal:  Left leg  expectedly tender to touch and ROM. Able to lift left leg off bed.  Lymphadenopathy:  She has no cervical adenopathy.   Neurological: She is alert and oriented to person, place, and time. No cranial nerve deficit. Coordination normal.  Patient follows commands. UE strenght 5/5 bilaterally. RLE is 4/5 HF,KE to 5/5 at ankle. LLE HF  2+. Skin:  Left BKA site is clean and intact. No drainage. Multiple abrasions, old scars throughout the RLE. Psychiatric: She has a normal mood and affect. Her behavior is normal. Thought content normal   Assessment/Plan: 1. Functional deficits secondary to left BKA which require 3+ hours per day of interdisciplinary therapy in a comprehensive inpatient rehab setting. Physiatrist is providing close team supervision and 24 hour management of active medical problems listed below. Physiatrist and rehab team continue to assess barriers to discharge/monitor patient progress toward functional and medical goals. FIM: FIM - Bathing Bathing Steps Patient Completed: Chest;Right Arm;Left Arm;Abdomen;Front perineal area;Right upper leg;Buttocks;Left upper leg;Right lower leg (including foot) Bathing: 4: Steadying assist (Pt performed lateral leans not sit to stand.)  FIM - Upper Body Dressing/Undressing Upper body dressing/undressing steps patient completed: Thread/unthread right bra strap;Thread/unthread left bra strap;Hook/unhook bra;Thread/unthread right sleeve of pullover shirt/dresss;Thread/unthread left sleeve of pullover shirt/dress;Put head through opening of pull over shirt/dress;Pull shirt over trunk Upper body dressing/undressing: 5: Set-up assist to: Obtain clothing/put away FIM - Lower Body Dressing/Undressing Lower body dressing/undressing steps patient completed: Thread/unthread right underwear leg;Thread/unthread left underwear leg;Pull underwear up/down;Thread/unthread right pants leg;Thread/unthread left pants leg;Pull pants up/down;Don/Doff right sock Lower  body dressing/undressing: 4: Steadying Assist (Pt performed lateral leans not sit to stand.)  FIM -  Toileting Toileting: 1: Two helpers  FIM - Radio producer Devices: Nurse, learning disability Transfers: 1-From toilet/BSC: Total A (helper does all/Pt. < 25%);1-To toilet/BSC: Total A (helper does all/Pt. < 25%)  FIM - Bed/Chair Transfer Bed/Chair Transfer: 4: Supine > Sit: Min A (steadying Pt. > 75%/lift 1 leg);2: Bed > Chair or W/C: Max A (lift and lower assist)  FIM - Locomotion: Wheelchair Distance: 80' FIM - Locomotion: Ambulation Ambulation/Gait Assistance: Not tested (comment)  Comprehension Comprehension Mode: Auditory Comprehension: 7-Follows complex conversation/direction: With no assist  Expression Expression Mode: Verbal Expression: 7-Expresses complex ideas: With no assist  Social Interaction Social Interaction: 7-Interacts appropriately with others - No medications needed.  Problem Solving Problem Solving: 7-Solves complex problems: Recognizes & self-corrects  Memory Memory: 7-Complete Independence: No helper  Medical Problem List and Plan:  1. 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 DVT Prophylaxis/Anticoagulation: Subcutaneous heparin.  3. Pain Management: Oxycodone as needed. Monitor with increased mobility, keep stump wrapped and protected  4. Neuropsych: This patient is capable of making decisions on her own behalf.  5. Chronic anemia.  hemoglobin 8.4.  . Latest hemoglobin prior to admission 8.6  6. Acute CHF/bronchitis. Lasix 80 mg twice daily. No complaints of shortness of breath.   Strict I and O.'s  7. Diabetes mellitus with peripheral neuropathy. Hemoglobin A1c 6.6. Presently with sliding scale insulin. Patient on Lantus insulin 18 units each bedtime prior to admission. Sugars reasonable at present.  8. Hypertension: Coreg 12.5 mg twice a day, Bidil 0.5 mg twice a day. Monitor with increased mobility  9. Chronic renal insufficiency. Baseline creatinine 3.58. Strict I and O.'s. Followup chemistries   Consistent--encourage fluids 10. GERD. Protonix  LOS (Days) 3 A FACE TO FACE EVALUATION WAS PERFORMED  Karen [name] 07/03/2013 7:50 AM

## 2013-07-03 NOTE — Progress Notes (Addendum)
Physical Therapy Session Note  Patient Details  Name: Karen [name] MRN: [id number] Date of Birth: October 25, 1975  Today's Date: 07/03/2013 Time: [id number] Time Calculation (min): 30 min  Short Term Goals: Week 1:  PT Short Term Goal 1 (Week 1): Patient will be able to perform bed mobility with min-assist PT Short Term Goal 2 (Week 1): Patient will be able to perform Transfers with min-assist PT Short Term Goal 3 (Week 1): Patient will be able to perform wheel chair management with min-assist and prope x 150'  Skilled Therapeutic Interventions/Progress Updates:    Attempted to find wheelchair with higher seat as pt has difficulty with sit <> stands from current wheelchair, unsuccessful. Sit <> stands in parallel bars with max assist. PT demosntration and education on pre and early stand mechanics, PT facilitated this as pt attempted carryover learning to sit > stand transition. Pt's shorts wet (? Urine), opted to return to bed with mod/max assist scoot pivot. Facilitating anterior weight shift. Wheelchair set-up with mod cues. Pt left with RN sitting EOB to receive meds.   Therapy Documentation Precautions:  Precautions Precautions: Fall Precaution Comments: avoid prolonged knee or hip flexion surgical side, KI in place this eval Required Braces or Orthoses: Knee Immobilizer - Right Restrictions Weight Bearing Restrictions: Yes LLE Weight Bearing: Non weight bearing Pain: No c/o pain  See FIM for current functional status  Therapy/Group: Individual Therapy  [name] 07/03/2013, 4:10 PM

## 2013-07-03 NOTE — IPOC Note (Addendum)
Overall Plan of Care [facility]) Patient Details Name: Karen [name] MRN: [id number] DOB: [birth year]  Admitting Diagnosis: LT BKA  Hospital Problems: Principal Problem:   Hx of BKA     Functional Problem List: Nursing Edema;Medication Management;Perception;Pain;Safety;Sensory;Skin Integrity  PT Balance;Endurance;Motor;Pain;Safety  OT Balance;Endurance;Pain;Safety  SLP    TR         Basic ADL's: OT Grooming;Bathing;Dressing;Toileting     Advanced  ADL's: OT Simple Meal Preparation     Transfers: PT Bed Mobility;Bed to Chair;Car;Furniture  OT Toilet;Tub/Shower     Locomotion: Advertising account planner;Ambulation     Additional Impairments: OT    SLP        TR      Anticipated Outcomes Item Anticipated Outcome  Self Feeding NA pt independent  Swallowing      Basic self-care  supervision  Toileting  modified independent   Bathroom Transfers modified independent  Bowel/Bladder  Remain continent of bowel and bladder  Transfers  S/Mod-Independent  Locomotion  w/c x 150' S/Independent and limited RW gait x 20'  Communication     Cognition     Pain  3 or less out of 10  Safety/Judgment  Minimal assist   Therapy Plan: PT Intensity: Minimum of 1-2 x/day ,45 to 90 minutes PT Frequency: 5 out of 7 days PT Duration Estimated Length of Stay: 2 weeks OT Intensity: Minimum of 1-2 x/day, 45 to 90 minutes OT Frequency: 5 out of 7 days OT Duration/Estimated Length of Stay: 14 days         Team Interventions: Nursing Interventions Patient/Family Education;Disease Management/Prevention;Pain Management;Medication Management;Skin Care/Wound Management  PT interventions Ambulation/gait training;Balance/vestibular training;DME/adaptive equipment instruction;Functional mobility training;Pain management;Patient/family education;Therapeutic Activities;Therapeutic Exercise;UE/LE Strength taining/ROM;UE/LE Coordination activities  OT Interventions Community  reintegration;DME/adaptive equipment instruction;Discharge planning;Balance/vestibular training;Pain management;Patient/family education;Functional mobility training;Psychosocial support;Neuromuscular re-education;UE/LE Strength taining/ROM;Therapeutic Exercise;Therapeutic Activities;Self Care/advanced ADL retraining;Wheelchair propulsion/positioning;UE/LE Coordination activities  SLP Interventions    TR Interventions    SW/CM Interventions Discharge Planning;Psychosocial Support;Patient/Family Education    Team Discharge Planning: Destination: PT-Home (patient is actively pursuing home rental w/o steps) ,OT- Home , SLP-  Projected Follow-up: PT-Home health PT, OT-  Home health OT, SLP-  Projected Equipment Needs: PT- TBD baseed on d/c destination (Probable wheelchair), OT- 3 in 1 bedside comode;Tub/shower bench (drop arm commode possibly, depends on progress), SLP-  Patient/family involved in discharge planning: PT- Patient,  OT-Patient, SLP-   MD ELOS: 2 weeks Medical Rehab Prognosis:  Excellent Assessment: The patient has been admitted for CIR therapies. The team will be addressing, functional mobility, strength, stamina, balance, safety, adaptive techniques/equipment, self-care, bowel and bladder mgt, patient and caregiver education, pre-prosthetic education, pain mgt. Goals have been set at supervision to mod I at a wheelchair level. Karen Staggers, MD, FAAPMR      See Team Conference Notes for weekly updates to the plan of care

## 2013-07-03 NOTE — Progress Notes (Signed)
Occupational Therapy Session Note  Patient Details  Name: Karen [name] MRN: [id number] Date of Birth: 1976-09-13  Today's Date: 07/03/2013  Session 1 Time: [id number] Time Calculation (min): 57 min  Short Term Goals: Week 1:  OT Short Term Goal 1 (Week 1): Pt will perform LB bathing with mod assist sit to stand from the wheelchair. OT Short Term Goal 2 (Week 1): Pt will pull pants/underpants over hips with mod assist during toileting or dressing, sit to stand. OT Short Term Goal 3 (Week 1): Pt will perform transfer to 3:1 with min a squat pivot vs RW stand pivot. OT Short Term Goal 4 (Week 1): Pt will donn KI independently in order to avoid L knee contracture.  Skilled Therapeutic Interventions/Progress Updates:    Pt resting in bed upon arrival and agreeable to bathing and dressing EOB.  Pt completed bathing tasks with min A to bathe Rt foot only.  Pt incorporated lateral leans to bathe front perineal area and buttocks as well as to pull up pants.  Pt required min A for stand pivot transfer from EOB to w/c. Pt was able to perform sit->stand from EOB with min A.  Pt required min A to don KI after seated in w/c.  Pt completed grooming w/c level at sink.  Focus on bed mobility, sit<>stand, transfers, w/c mobility, activity tolerance, and safety awareness.  Therapy Documentation Precautions:  Precautions Precautions: Fall Precaution Comments: avoid prolonged knee or hip flexion surgical side, KI in place this eval Required Braces or Orthoses: Knee Immobilizer - Right Restrictions Weight Bearing Restrictions: Yes LLE Weight Bearing: Non weight bearing   Pain: Pain Assessment Pain Assessment: 0-10 Pain Score: 7  Pain Type: Surgical pain Pain Location: Leg Pain Orientation: Left Pain Descriptors / Indicators: Aching Pain Onset: Gradual Pain Intervention(s): RN made aware;Repositioned  See FIM for current functional status  Therapy/Group: Individual Therapy  Session 2 Time:  [id number] Pt denies pain Individual Therapy  Pt seated in w/c upon arrival and requested to use toilet prior to going to gym.  Initially attempted to perform sit<>stand with grab bars in preparation for stand step transfer to [facility] placed over toilet.  Pt unable to assist with standing from w/c and became anxious.  Pt unable to perform stand pivot transfer from w/c using grab bars.  Pt used bed pan while seated in w/c utilizing lateral leans for placement and pulling down and pulling up pants.  Pt required assistance for pulling down pants and placing bed pan.  Pt transitioned to therapy gym for BUE therex on UE ergometer (4 min X 2 in opposite directions at work level 4) before returning to room.  Discussed with patient that a different BSC will be placed in her room and she would have opportunity to practice transfers to [facility] again tomorrow.  Focus on transfers, activity tolerance, and safety awareness.  Leotis Shames [facility] 07/03/2013, 10:00 AM

## 2013-07-04 ENCOUNTER — Inpatient Hospital Stay (HOSPITAL_COMMUNITY): Payer: Medicaid Other

## 2013-07-04 ENCOUNTER — Inpatient Hospital Stay (HOSPITAL_COMMUNITY): Payer: Medicaid Other | Admitting: Occupational Therapy

## 2013-07-04 ENCOUNTER — Inpatient Hospital Stay (HOSPITAL_COMMUNITY): Payer: Medicaid Other | Admitting: Physical Therapy

## 2013-07-04 DIAGNOSIS — E1165 Type 2 diabetes mellitus with hyperglycemia: Secondary | ICD-10-CM

## 2013-07-04 DIAGNOSIS — I739 Peripheral vascular disease, unspecified: Secondary | ICD-10-CM

## 2013-07-04 DIAGNOSIS — S88119A Complete traumatic amputation at level between knee and ankle, unspecified lower leg, initial encounter: Secondary | ICD-10-CM

## 2013-07-04 DIAGNOSIS — L98499 Non-pressure chronic ulcer of skin of other sites with unspecified severity: Secondary | ICD-10-CM

## 2013-07-04 LAB — GLUCOSE, CAPILLARY
Glucose-Capillary: 102 mg/dL — ABNORMAL HIGH (ref 70–99)
Glucose-Capillary: 116 mg/dL — ABNORMAL HIGH (ref 70–99)
Glucose-Capillary: 180 mg/dL — ABNORMAL HIGH (ref 70–99)

## 2013-07-04 NOTE — Progress Notes (Signed)
Subjective/Complaints: Didn't sleep as well last night. Sleep is sometimes broken at home..  A 12 point review of systems has been performed and if not noted above is otherwise negative.  Objective: Vital Signs: Blood pressure 138/68, pulse 83, temperature 98.1 F (36.7 C), temperature source Oral, resp. rate 18, height 5' 6.5" (1.689 m), weight 106.1 kg (233 lb 14.5 oz), last menstrual period 05/22/2013, SpO2 93.00%. No results found.  Recent Labs  07/03/13 0500  WBC 10.6*  HGB 8.4*  HCT 26.5*  PLT 279    Recent Labs  07/03/13 0500 07/03/13 0910  NA 132* 132*  K 4.5 4.7  CL 101 101  GLUCOSE 136* 149*  BUN 45* 45*  CREATININE 2.46* 2.47*  CALCIUM 8.7 8.8   CBG (last 3)   Recent Labs  07/03/13 1628 07/03/13 2052 07/04/13 0733  GLUCAP 159* 143* 102*    Wt Readings from Last 3 Encounters:  07/04/13 106.1 kg (233 lb 14.5 oz)  06/30/13 106.6 kg (235 lb 0.2 oz)  06/30/13 106.6 kg (235 lb 0.2 oz)    Physical Exam:  Constitutional: She is oriented to person, place, and time. She appears well-developed and well-nourished. obese  HENT:  Head: Normocephalic and atraumatic.  Right Ear: External ear normal.  Left Ear: External ear normal.  Eyes: Conjunctivae and EOM are normal. Pupils are equal, round, and reactive to light.  Neck: Normal range of motion. Neck supple. No JVD present. No tracheal deviation present. No thyromegaly present.  Cardiovascular: Normal rate and regular rhythm.  No murmur heard.  Pulmonary/Chest: No respiratory distress. She has no wheezes. She has no rales.  Decreased breath sounds at the bases but clear to auscultation.  Abdominal: Soft. Bowel sounds are normal. She exhibits no distension.  Musculoskeletal:  Left leg  expectedly tender to touch and ROM. Able to lift left leg off bed.  Lymphadenopathy:  She has no cervical adenopathy.  Neurological: She is alert and oriented to person, place, and time. No cranial nerve deficit. Coordination  normal.  Patient follows commands. UE strenght 5/5 bilaterally. RLE is 4/5 HF,KE to 5/5 at ankle. LLE HF  2+. Skin:  Left BKA site is clean and intact. No drainage. Multiple abrasions, old scars throughout the RLE. Psychiatric: She has a normal mood and affect. Her behavior is normal. Thought content normal   Assessment/Plan: 1. Functional deficits secondary to left BKA which require 3+ hours per day of interdisciplinary therapy in a comprehensive inpatient rehab setting. Physiatrist is providing close team supervision and 24 hour management of active medical problems listed below. Physiatrist and rehab team continue to assess barriers to discharge/monitor patient progress toward functional and medical goals. FIM: FIM - Bathing Bathing Steps Patient Completed: Chest;Right Arm;Left Arm;Abdomen;Front perineal area;Buttocks;Right upper leg;Left upper leg;Right lower leg (including foot) Bathing: 5: Supervision: Safety issues/verbal cues (EOB)  FIM - Upper Body Dressing/Undressing Upper body dressing/undressing steps patient completed: Thread/unthread right sleeve of pullover shirt/dresss;Thread/unthread left sleeve of pullover shirt/dress;Put head through opening of pull over shirt/dress;Pull shirt over trunk Upper body dressing/undressing: 5: Set-up assist to: Obtain clothing/put away FIM - Lower Body Dressing/Undressing Lower body dressing/undressing steps patient completed: Thread/unthread right pants leg;Thread/unthread left pants leg;Pull pants up/down;Don/Doff right sock Lower body dressing/undressing: 4: Min-Patient completed 75 plus % of tasks  FIM - Toileting Toileting: 1: Two helpers  FIM - Radio producer Devices: Nurse, learning disability Transfers: 1-Two helpers  FIM - IT sales professional Transfer: 3: Chair or W/C > Bed: Mod A (  lift or lower assist);3: Bed > Chair or W/C: Mod A (lift or lower assist)  FIM - Locomotion:  Wheelchair Distance: 80' Locomotion: Wheelchair: 2: Travels 50 - 149 ft with supervision, cueing or coaxing FIM - Locomotion: Ambulation Ambulation/Gait Assistance:  (unsafe to attempt)  Comprehension Comprehension Mode: Auditory Comprehension: 7-Follows complex conversation/direction: With no assist  Expression Expression Mode: Verbal Expression: 7-Expresses complex ideas: With no assist  Social Interaction Social Interaction: 7-Interacts appropriately with others - No medications needed.  Problem Solving Problem Solving: 7-Solves complex problems: Recognizes & self-corrects  Memory Memory: 7-Complete Independence: No helper  Medical Problem List and Plan:  1. 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 DVT Prophylaxis/Anticoagulation: Subcutaneous heparin.  3. Pain Management: Oxycodone as needed. Monitor with increased mobility, keep stump wrapped and protected  4. Neuropsych: This patient is capable of making decisions on her own behalf.  5. Chronic anemia.  hemoglobin 8.4.  . Latest hemoglobin prior to admission 8.6  6. Acute CHF/bronchitis. Lasix 80 mg twice daily. No complaints of shortness of breath.   Strict I and O.'s  7. Diabetes mellitus with peripheral neuropathy. Hemoglobin A1c 6.6. Presently with sliding scale insulin. Patient on Lantus insulin 18 units each bedtime prior to admission. Sugars under control 8. Hypertension: Coreg 12.5 mg twice a day, Bidil 0.5 mg twice a day. Monitor with increased mobility  9. Chronic renal insufficiency. CR 2.47. Strict I and O.'s.---encourage fluids 10. GERD. Protonix  LOS (Days) 4 A FACE TO FACE EVALUATION WAS PERFORMED  SWARTZ,ZACHARY T 07/04/2013 7:50 AM

## 2013-07-04 NOTE — Progress Notes (Signed)
B/P @ [id number]: 160/102. Carvedilol adm at this time. Patient remains asymptomatic. Laughing and talking with visitor at bedside. Thayer Ohm A

## 2013-07-04 NOTE — Progress Notes (Signed)
Occupational Therapy Session Note  Patient Details  Name: Scheherazade Pfennig MRN: [id number] Date of Birth: 1976-05-15  Today's Date: 07/04/2013  Session 1 Time: 0800-0900 Time Calculation (min): 60 min  Short Term Goals: Week 1:  OT Short Term Goal 1 (Week 1): Pt will perform LB bathing with mod assist sit to stand from the wheelchair. OT Short Term Goal 2 (Week 1): Pt will pull pants/underpants over hips with mod assist during toileting or dressing, sit to stand. OT Short Term Goal 3 (Week 1): Pt will perform transfer to 3:1 with min a squat pivot vs RW stand pivot. OT Short Term Goal 4 (Week 1): Pt will donn KI independently in order to avoid L knee contracture.  Skilled Therapeutic Interventions/Progress Updates:    Pt seated EOB with RN at bedside administering medications.  Pt stated that she didn't sleep well last night but wanted to wash up and get dressed.  Pt completed all bathing and dressing tasks seated EOB utilizing lateral leans to bathe buttocks and pull up pants.  Pt is very thorough and requires increased time to complete tasks.  Pt performed sit<>stand X 3 from EOB but remained seated EOB for next therapy.  Focus on bed mobility, activity tolerance, sit<>stand, and safety awareness  Therapy Documentation Precautions:  Precautions Precautions: Fall Precaution Comments: avoid prolonged knee or hip flexion surgical side, KI in place this eval Required Braces or Orthoses: Knee Immobilizer - Right Restrictions Weight Bearing Restrictions: Yes LLE Weight Bearing: Non weight bearing   Pain: Pain Assessment Pain Assessment: No/denies pain Pain Score: 0-No pain  See FIM for current functional status  Therapy/Group: Individual Therapy  Session 2 Time: [id number] Pt denies pain Individual Therapy  Pt initially engaged in multiple attempts to perform sit<>stand from w/c.  Pt stated she had just transferred to and from [facility] in room prior to therapy and her RLE was tired and  sore.  Pt stated that her right leg had always been weaker and she had difficulty getting up from lower surfaces before surgery.  Provided encouragement to patient and explained that we would keep trying to build up strength and endurance.  Pt engaged in BUE therex to increased endurance and UE strength to assist with sit<>stand, transfers, and ambulation.  Leotis Shames [facility] 07/04/2013, 9:02 AM

## 2013-07-04 NOTE — Progress Notes (Signed)
Occupational Therapy Session Note  Patient Details  Name: Karen [name] MRN: [id number] Date of Birth: 06-28-1976  Today's Date: 07/04/2013 Time: 0800-0900 Time Calculation (min): 60 min  Short Term Goals: Week 1:  OT Short Term Goal 1 (Week 1): Pt will perform LB bathing with mod assist sit to stand from the wheelchair. OT Short Term Goal 2 (Week 1): Pt will pull pants/underpants over hips with mod assist during toileting or dressing, sit to stand. OT Short Term Goal 3 (Week 1): Pt will perform transfer to 3:1 with min a squat pivot vs RW stand pivot. OT Short Term Goal 4 (Week 1): Pt will donn KI independently in order to avoid L knee contracture.  Skilled Therapeutic Interventions/Progress Updates:  1:1 tx. Pt seen for ADL transfer & toilet training and fx'l endurance. Pt sitting EOB upon entering room requesting to use commode. Drop arm commode placed next to bed. Pt educated re: scoot pivot transfer but ended up doing a modified stand pivot transfer w/o AD to commode w/hands on steadying assist. Pt unable to void even with extra time and sound of running water. Pt voiced frustration that she has the urge to void but cannot release urine. Pt able to perform sit>stand to RW w/steadying assist and maintain standing balance with RW w/same. She was able to release UE grip one hand at a time to assist w/clothing mgmt. Pt performed SPT using RW w/min A and verbal cues due to novel task. Pt propelled w/c approximately 270ft w/1 rest break and increased time. Pt encouraged by progress this date.   Therapy Documentation Precautions:  Precautions Precautions: Fall Precaution Comments: avoid prolonged knee or hip flexion surgical side, KI in place this eval Required Braces or Orthoses: Knee Immobilizer - Right Restrictions Weight Bearing Restrictions: Yes LLE Weight Bearing: Non weight bearing Vital Signs: Therapy Vitals Temp: 97.5 F (36.4 C) Pulse Rate: 80 BP: 145/95 mmHg (Med  given) Patient Position, if appropriate: Sitting Pain: Pain Assessment Pain Assessment: No/denies pain Pain Score: 0-No pain  See FIM for current functional status  Therapy/Group: Individual Therapy  Karen [name], Scot Jun 07/04/2013, 11:08 AM

## 2013-07-04 NOTE — Progress Notes (Addendum)
Physical Therapy Session Note  Patient Details  Name: Karen [name] MRN: [id number] Date of Birth: 18-Aug-1976  Today's Date: 07/04/2013 Time: 1445-1500 Time Calculation (min): 15 min  Short Term Goals: Week 1:  PT Short Term Goal 1 (Week 1): Patient will be able to perform bed mobility with min-assist PT Short Term Goal 2 (Week 1): Patient will be able to perform Transfers with min-assist PT Short Term Goal 3 (Week 1): Patient will be able to perform wheel chair management with min-assist and prope x 150'  Skilled Therapeutic Interventions/Progress Updates:    Per nursing and OT pt having difficulty with transfers to/from bedside commode. Pt would benefit from being able to transfer to Rt (towards intact LE) however unable to find BSC with arm that drops on appropriate side. OT aware and will look for one again tomorrow. Discussed transfers and PT recommending transfer to bed then transfer to bedside commode, RN also aware. Unable to practice this however BP noted to be 168/112. Did not continue therapy due to risks associated with the this high BP.    Therapy Documentation Precautions:  Precautions Precautions: Fall Precaution Comments: avoid prolonged knee or hip flexion surgical side, KI in place this eval Required Braces or Orthoses: Knee Immobilizer - Right Restrictions Weight Bearing Restrictions: Yes LLE Weight Bearing: Non weight bearing General: Amount of Missed PT Time (min): 15 Minutes Missed Time Reason: Other (comment) (therapy contraindicated diastolic BP [id number]) Pain: Pain Assessment Pain Assessment: No/denies pain   See FIM for current functional status  Therapy/Group: Individual Therapy  [name] 07/04/2013, 3:33 PM

## 2013-07-04 NOTE — Progress Notes (Signed)
Patient getting ready for therapy; B/P noted 168/112. Patient asymptomatic. Karen Mesi, PA notified. Continue care of plan. Thayer Ohm A

## 2013-07-04 NOTE — Plan of Care (Signed)
Problem: RH SKIN INTEGRITY Goal: RH STG SKIN FREE OF INFECTION/BREAKDOWN Remain free of skin breakdown while on rehab unit  Outcome: Not Applicable Date Met:  [id number] Pt has stump shrinker in place but refuses to allow Nursing staff to check dressing. Pt only wants doctor to change her dressing.

## 2013-07-04 NOTE — Progress Notes (Signed)
Physical Therapy Session Note  Patient Details  Name: Karen [name] MRN: [id number] Date of Birth: 1975-10-09  Today's Date: 07/04/2013 Time: 0930-1000 Time Calculation (min): 30 min  Short Term Goals: Week 1:  PT Short Term Goal 1 (Week 1): Patient will be able to perform bed mobility with min-assist PT Short Term Goal 2 (Week 1): Patient will be able to perform Transfers with min-assist PT Short Term Goal 3 (Week 1): Patient will be able to perform wheel chair management with min-assist and prope x 150'  Skilled Therapeutic Interventions/Progress Updates:    Good session! Ambulation x 15', 13' hop gait with RW and +2 for safety but overall mod assist with chair to follow. Wheelchair propulsion for endurance x 80' with supervision, min assist for wheelchair set-up and parts management. When questioned pt able to verbalize need for pressure relief every 30 min when sitting in wheelchair.   Therapy Documentation Precautions:  Precautions Precautions: Fall Precaution Comments: avoid prolonged knee or hip flexion surgical side, KI in place this eval Required Braces or Orthoses: Knee Immobilizer - Right Restrictions Weight Bearing Restrictions: Yes LLE Weight Bearing: Non weight bearing Pain: Pain Assessment Pain Assessment: No/denies pain Pain Score: 0-No pain  See FIM for current functional status  Therapy/Group: Individual Therapy  [name] 07/04/2013, 12:05 PM

## 2013-07-05 ENCOUNTER — Inpatient Hospital Stay (HOSPITAL_COMMUNITY): Payer: Medicaid Other | Admitting: Physical Therapy

## 2013-07-05 ENCOUNTER — Inpatient Hospital Stay (HOSPITAL_COMMUNITY): Payer: Medicaid Other | Admitting: Occupational Therapy

## 2013-07-05 ENCOUNTER — Inpatient Hospital Stay (HOSPITAL_COMMUNITY): Payer: Medicaid Other

## 2013-07-05 DIAGNOSIS — I739 Peripheral vascular disease, unspecified: Secondary | ICD-10-CM

## 2013-07-05 DIAGNOSIS — S88119A Complete traumatic amputation at level between knee and ankle, unspecified lower leg, initial encounter: Secondary | ICD-10-CM

## 2013-07-05 DIAGNOSIS — E1165 Type 2 diabetes mellitus with hyperglycemia: Secondary | ICD-10-CM

## 2013-07-05 DIAGNOSIS — L98499 Non-pressure chronic ulcer of skin of other sites with unspecified severity: Secondary | ICD-10-CM

## 2013-07-05 LAB — GLUCOSE, CAPILLARY
Glucose-Capillary: 105 mg/dL — ABNORMAL HIGH (ref 70–99)
Glucose-Capillary: 136 mg/dL — ABNORMAL HIGH (ref 70–99)
Glucose-Capillary: 156 mg/dL — ABNORMAL HIGH (ref 70–99)

## 2013-07-05 MED ORDER — CAMPHOR-MENTHOL 0.5-0.5 % EX LOTN
TOPICAL_LOTION | CUTANEOUS | Status: DC | PRN
  Filled 2013-07-05: qty 222

## 2013-07-05 NOTE — Progress Notes (Signed)
Subjective/Complaints: Slept better. Asks if her dermatologist from [place] can see her for lesions on skin which itch---problem for a year or so A 12 point review of systems has been performed and if not noted above is otherwise negative.  Objective: Vital Signs: Blood pressure 129/81, pulse 87, temperature 97.7 F (36.5 C), temperature source Oral, resp. rate 20, height 5' 6.5" (1.689 m), weight 100.6 kg (221 lb 12.5 oz), last menstrual period 05/22/2013, SpO2 95.00%. No results found.  Recent Labs  07/03/13 0500  WBC 10.6*  HGB 8.4*  HCT 26.5*  PLT 279    Recent Labs  07/03/13 0500 07/03/13 0910  NA 132* 132*  K 4.5 4.7  CL 101 101  GLUCOSE 136* 149*  BUN 45* 45*  CREATININE 2.46* 2.47*  CALCIUM 8.7 8.8   CBG (last 3)   Recent Labs  07/04/13 1131 07/04/13 1635 07/04/13 2042  GLUCAP 116* 180* 128*    Wt Readings from Last 3 Encounters:  07/05/13 100.6 kg (221 lb 12.5 oz)  06/30/13 106.6 kg (235 lb 0.2 oz)  06/30/13 106.6 kg (235 lb 0.2 oz)    Physical Exam:  Constitutional: She is oriented to person, place, and time. She appears well-developed and well-nourished. obese  HENT:  Head: Normocephalic and atraumatic.  Right Ear: External ear normal.  Left Ear: External ear normal.  Eyes: Conjunctivae and EOM are normal. Pupils are equal, round, and reactive to light.  Neck: Normal range of motion. Neck supple. No JVD present. No tracheal deviation present. No thyromegaly present.  Cardiovascular: Normal rate and regular rhythm.  No murmur heard.  Pulmonary/Chest: No respiratory distress. She has no wheezes. She has no rales.  Decreased breath sounds at the bases but clear to auscultation.  Abdominal: Soft. Bowel sounds are normal. She exhibits no distension.  Musculoskeletal:  Left leg  expectedly tender to touch and ROM. Able to lift left leg off bed.  Lymphadenopathy:  She has no cervical adenopathy.  Neurological: She is alert and oriented to person,  place, and time. No cranial nerve deficit. Coordination normal.  Patient follows commands. UE strenght 5/5 bilaterally. RLE is 4/5 HF,KE to 5/5 at ankle. LLE HF  2+. Skin:  Left BKA site is clean and intact. No drainage. Multiple abrasions, old poc-like scars/lesions on limbs, chest Psychiatric: She has a normal mood and affect. Her behavior is normal. Thought content normal   Assessment/Plan: 1. Functional deficits secondary to left BKA which require 3+ hours per day of interdisciplinary therapy in a comprehensive inpatient rehab setting. Physiatrist is providing close team supervision and 24 hour management of active medical problems listed below. Physiatrist and rehab team continue to assess barriers to discharge/monitor patient progress toward functional and medical goals. FIM: FIM - Bathing Bathing Steps Patient Completed: Chest;Right Arm;Left Arm;Abdomen;Front perineal area;Buttocks;Right upper leg;Left upper leg;Right lower leg (including foot) Bathing: 5: Supervision: Safety issues/verbal cues  FIM - Upper Body Dressing/Undressing Upper body dressing/undressing steps patient completed: Thread/unthread right sleeve of pullover shirt/dresss;Thread/unthread left sleeve of pullover shirt/dress;Put head through opening of pull over shirt/dress;Pull shirt over trunk Upper body dressing/undressing: 5: Set-up assist to: Obtain clothing/put away FIM - Lower Body Dressing/Undressing Lower body dressing/undressing steps patient completed: Thread/unthread right pants leg;Thread/unthread left pants leg;Pull pants up/down;Don/Doff right sock Lower body dressing/undressing: 5: Set-up assist to: Obtain clothing  FIM - Toileting Toileting steps completed by patient: Adjust clothing prior to toileting;Performs perineal hygiene;Adjust clothing after toileting Toileting: 4: Steadying assist  FIM - Media planner  Assistive Devices: Bedside commode Toilet Transfers: 4-From  toilet/BSC: Min A (steadying Pt. > 75%)  FIM - Bed/Chair Transfer Bed/Chair Transfer: 3: Chair or W/C > Bed: Mod A (lift or lower assist);3: Bed > Chair or W/C: Mod A (lift or lower assist)  FIM - Locomotion: Wheelchair Distance: 80' Locomotion: Wheelchair: 2: Travels 50 - 149 ft with supervision, cueing or coaxing FIM - Locomotion: Ambulation Locomotion: Ambulation Assistive Devices: Administrator Ambulation/Gait Assistance: 3: Mod assist Locomotion: Ambulation: 1: Two helpers  Comprehension Comprehension Mode: Auditory Comprehension: 7-Follows complex conversation/direction: With no assist  Expression Expression Mode: Verbal Expression: 7-Expresses complex ideas: With no assist  Social Interaction Social Interaction: 7-Interacts appropriately with others - No medications needed.  Problem Solving Problem Solving: 7-Solves complex problems: Recognizes & self-corrects  Memory Memory: 7-Complete Independence: No helper  Medical Problem List and Plan:  1. 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 DVT Prophylaxis/Anticoagulation: Subcutaneous heparin.  3. Pain Management: Oxycodone as needed. Monitor with increased mobility, keep stump wrapped and protected  4. Neuropsych: This patient is capable of making decisions on her own behalf.  5. Chronic anemia.  hemoglobin 8.4.  . Latest hemoglobin prior to admission 8.6  6. Acute CHF/bronchitis. Lasix 80 mg twice daily. No complaints of shortness of breath.   Strict I and O.'s  7. Diabetes mellitus with peripheral neuropathy. Hemoglobin A1c 6.6. Presently with sliding scale insulin. Patient on Lantus insulin 18 units each bedtime prior to admission. Sugars under control 8. Hypertension: Coreg 12.5 mg twice a day, Bidil 0.5 mg twice a day. Monitor with increased mobility  9. Chronic renal insufficiency. CR 2.47. Strict I and O.'s.---encourage fluids 10. GERD. Protonix 11. Derm--small papular/poc-like lesions---?chronic,  almost look like insect bites  -will see if we can contact derm  -sarna lotion  LOS (Days) 5 A FACE TO FACE EVALUATION WAS PERFORMED  Neyland Pettengill T 07/05/2013 7:08 AM

## 2013-07-05 NOTE — Progress Notes (Signed)
Social Work Patient ID: Karen [name], female   DOB: 05-Jan-1976, 37 y.o.   MRN: [id number]  Met with patient this afternoon to review team conference.  Pt aware and agreeable with targeted d/c date of 10/24 and that accessibility is still of concern in terms of d/c location.  Pt reports that her godmother and friends are working on making headway with Housing authority to secure a better location and she will keep me posted on developments.  She also plans to have the contact person with [facility] to follow up with me as well.  Continue to follow.  Burnett Lieber, LCSW

## 2013-07-05 NOTE — Progress Notes (Signed)
Physical Therapy Note  Patient Details  Name: Kamlesh Yslas MRN: [id number] Date of Birth: 05/22/1976 Today's Date: 07/05/2013  1000-1055 (55 minutes) individual Pain : Pt reports unrated "soreness" Lt BKA / premedicated Focus of treatment: wc mobility training; gait training; transfer training; therapeutic exercise focused on bilateral LE strengtnening Treatment: Pt in wc upon arrival; wc mobility 120 feet X 2 SBA on level; wc setup for transfer (scoot) vcs for safe positioning of wc ; scoot transfer SBA ; sit >< supine SBA to independent; therapeutic exercise X 15 - RT ankle pumps, bilateral hip/ knee flexion, bilateral hip abduction, SAQs; sit to stand to RW from mat min assist ; gait along mat with RW min assist with pt "scooting RT foot"; transfer with RW min assist.   Akia Montalban,JIM 07/05/2013, 10:52 AM

## 2013-07-05 NOTE — Progress Notes (Signed)
Occupational Therapy  Cancellation Note  Patient Details  Name: Karen [name] MRN: [id number] Date of Birth: 09/06/1976 Today's Date: 07/05/2013  Patient refused scheduled 1:00 PT session secondary to, "my friend just arrived with lunch".  Reviewed refusal policy and visiting hours on CIR.  Patient verbalizes understanding.     [place], [place] 07/05/2013, 1:31 PM

## 2013-07-05 NOTE — Progress Notes (Signed)
Patient's blood pressure 172/105, HR 86.  Marlowe [place], PA notified of patient's blood pressure and that patient is refusing her heparin shots.  No new orders received at this time.  Will continue to monitor.

## 2013-07-05 NOTE — Patient Care Conference (Signed)
Inpatient RehabilitationTeam Conference and Plan of Care Update Date: 07/04/2013   Time: 2:30 PM    Patient Name: Karen [name]      Medical Record Number: [id number]  Date of Birth: [birth year] Sex: Female         Room/Bed: 4M06C/4M06C-01 Payor Info: Payor: MEDICAID [place] / Plan: MEDICAID [place] ACCESS / Product Type: *No Product type* /    Admitting Diagnosis: LT BKA  Admit Date/Time:  06/30/2013  3:46 PM Admission Comments: No comment available   Primary Diagnosis:  Hx of BKA Principal Problem: Hx of BKA  Patient Active Problem List   Diagnosis Date Noted  . Hx of BKA 07/03/2013  . S/P BKA (below knee amputation) unilateral 06/30/2013  . Physical deconditioning 06/30/2013  . Foot osteomyelitis, left 06/24/2013  . Acute respiratory failure 06/22/2013  . CKD (chronic kidney disease) stage 3, GFR 30-59 ml/min due to DM 06/20/2013  . Cardiomyopathy, ischemic - EF 45-50% with inf WMA by 2D 02/05/13 02/06/2013  . Acute combined systolic and diastolic congestive heart failure 02/05/2013  . Acute renal failure 02/05/2013  . Essential hypertension 02/05/2013  . PAD (peripheral artery disease) 02/05/2013  . Type II or unspecified type diabetes mellitus with unspecified complication, uncontrolled   . Diabetic neuropathy   . Osteomyelitis of left great toe - S/P amputation 02/06/13 04/21/2011    Expected Discharge Date: Expected Discharge Date: 07/14/13  Team Members Present: Physician leading conference: Dr. Alger Simons Social Worker Present: Lennart Pall, LCSW Nurse Present: Heather Roberts, RN PT Present: Canary Brim, Rayburn Ma, PT OT Present: Chrys Racer, Starling Manns, OT PPS Coordinator present : Ileana Ladd, PT     Current Status/Progress Goal Weekly Team Focus  Medical   left bka, wound intact. already wearing shrinker, pain control better  stabilize medically for discharge  pain control, pre-pros education, bp control   Bowel/Bladder   Continent of bowel and  bladder. Continues to use bedpan due to mobility. LBM 07/04/13  Min assist  Up to the bathroom during the day; bedpan at night   Swallow/Nutrition/ Hydration             ADL's   dressing bed level - min A; bathing bed level-min a; toilet transfers-max/tot A  bathing-supervision; dressing, toilet transfers and toileting-mod I; meal prep-mod I; tub transfers-supervision  sit<>stand, transfers, standing balance, activity tolerance, safety awareness   Mobility   min assist scoot transfers from elevated surface, max to total assist from wheelchair height  supervision transfers and ? short distance gait, modified independent at wheelchair level  Endurance, transfers, sit <> stands, standing balance   Communication             Safety/Cognition/ Behavioral Observations  N/A         Pain   Pain in left BKA. Percocet 1 every 6 hrs  Less than 3  Monitor effectiveness of pain med   Skin   L BKA dressing intact. Patient refusing to change dressing. Multiple dark spots noted on skin.   No new breakdown  Educate patient on the importance of changing dressing and skin care.    Rehab Goals Patient on target to meet rehab goals: Yes *See Care Plan and progress notes for long and short-term goals.  Barriers to Discharge: none    Possible Resolutions to Barriers:  none    Discharge Planning/Teaching Needs:  home with young children (6 and 55 yrs old).  Godmother able to provide intermittent assist      Team Discussion:  Making good progress.  Incision healing well.  Some inconsistencies with tx but much better today.  Currently overall at min assist with mod i w/c level goals.  SW continues to work with pt on home modification/ change location issues.  Revisions to Treatment Plan:  NOne   Continued Need for Acute Rehabilitation Level of Care: The patient requires daily medical management by a physician with specialized training in physical medicine and rehabilitation for the following  conditions: Daily direction of a multidisciplinary physical rehabilitation program to ensure safe treatment while eliciting the highest outcome that is of practical value to the patient.: Yes Daily medical management of patient stability for increased activity during participation in an intensive rehabilitation regime.: Yes Daily analysis of laboratory values and/or radiology reports with any subsequent need for medication adjustment of medical intervention for : Other;Post surgical problems  Ahan Eisenberger 07/05/2013, 2:18 PM

## 2013-07-05 NOTE — Plan of Care (Signed)
Problem: RH SKIN INTEGRITY Goal: RH STG MAINTAIN SKIN INTEGRITY WITH ASSISTANCE STG Maintain Skin Integrity With min Assistance.  Outcome: Progressing Staples in place. Incision,celan,dry and intact,no sx of infection

## 2013-07-05 NOTE — Progress Notes (Signed)
Occupational Therapy Session Note  Patient Details  Name: Karen [name] MRN: [id number] Date of Birth: March 18, 1976  Today's Date: 07/05/2013  Session 1 Time: 0800-0900 Time Calculation (min): 60 min  Short Term Goals: Week 1:  OT Short Term Goal 1 (Week 1): Pt will perform LB bathing with mod assist sit to stand from the wheelchair. OT Short Term Goal 2 (Week 1): Pt will pull pants/underpants over hips with mod assist during toileting or dressing, sit to stand. OT Short Term Goal 3 (Week 1): Pt will perform transfer to 3:1 with min a squat pivot vs RW stand pivot. OT Short Term Goal 4 (Week 1): Pt will donn KI independently in order to avoid L knee contracture.  Skilled Therapeutic Interventions/Progress Updates:    Pt asleep in bed upon arrival but easily aroused. Pt engaged in bathing and dressing EOB.  Pt completed all tasks without assistance after setup.  Pt bathed buttocks and pulled up pants utilizing lateral leans.  Pt verbalized understanding that as the strength in her RLE improves she would be required to stand up to pull up her pants and was her buttocks. Pt verbalized understanding.  Pt requires extra time to complete all tasks.  Practiced sit<>stand from EOB and scoot/squat transfers bed<>w/c.  Pt remained in w/c with all needs within reach.   Therapy Documentation Precautions:  Precautions Precautions: Fall Precaution Comments: avoid prolonged knee or hip flexion surgical side, KI in place this eval Required Braces or Orthoses: Knee Immobilizer - Right Restrictions Weight Bearing Restrictions: Yes LLE Weight Bearing: Non weight bearing Pain: Pain Assessment Pain Assessment: No/denies pain  See FIM for current functional status  Therapy/Group: Individual Therapy  Session 2 Time: [id number] Pt denies pain Individual Therapy  Pt sitting in w/c upon arrival with friend at side.  Pt requested to use BSC but wanted to transfer to bed before transferring to [facility].  Pt  performed all transfers with min A (w/c->bed with scoot, bed->BSC with squat/stand step, BSC to w/c with stand/step). Pt rolled to therapy gym to practice sit<>stand from w/c with mod A.  Pt stated she gets very anxious and sometimes that prevents her from successfully performing tasks.  Pt rolled back room and remained in w/c with all needs within reach.  Leotis Shames [facility] 07/05/2013, 9:06 AM

## 2013-07-06 ENCOUNTER — Inpatient Hospital Stay (HOSPITAL_COMMUNITY): Payer: Medicaid Other

## 2013-07-06 ENCOUNTER — Inpatient Hospital Stay (HOSPITAL_COMMUNITY): Payer: Medicaid Other | Admitting: Physical Therapy

## 2013-07-06 ENCOUNTER — Inpatient Hospital Stay (HOSPITAL_COMMUNITY): Payer: Medicaid Other | Admitting: Occupational Therapy

## 2013-07-06 LAB — GLUCOSE, CAPILLARY
Glucose-Capillary: 137 mg/dL — ABNORMAL HIGH (ref 70–99)
Glucose-Capillary: 163 mg/dL — ABNORMAL HIGH (ref 70–99)
Glucose-Capillary: 117 mg/dL — ABNORMAL HIGH (ref 70–99)

## 2013-07-06 MED ORDER — CLONIDINE HCL 0.1 MG PO TABS
0.1000 mg | ORAL_TABLET | ORAL | Status: DC | PRN
  Filled 2013-07-06: qty 1

## 2013-07-06 NOTE — Progress Notes (Signed)
Occupational Therapy Session Note  Patient Details  Name: Karen [name] MRN: [id number] Date of Birth: 11/12/1975  Today's Date: 07/06/2013 Time: 1000-1100 Time Calculation (min): 60 min  Short Term Goals: Week 1:  OT Short Term Goal 1 (Week 1): Pt will perform LB bathing with mod assist sit to stand from the wheelchair. OT Short Term Goal 2 (Week 1): Pt will pull pants/underpants over hips with mod assist during toileting or dressing, sit to stand. OT Short Term Goal 3 (Week 1): Pt will perform transfer to 3:1 with min a squat pivot vs RW stand pivot. OT Short Term Goal 4 (Week 1): Pt will donn KI independently in order to avoid L knee contracture.  Skilled Therapeutic Interventions/Progress Updates:    Pt engaged in bathing and dressing tasks at bed level requiring only setup for supplies and clothing.  After patient completed BADLs patient practiced sit<>stand from EOB, bed<>w/c transfers, and dynamic standing tasks.  Pt performed sit<>stand from EOB at various heights to simulate variety of surfaces patient will encounter at home.  Pt performed all sit<>stand with supervision.  While standing patient was able to remove one UE from walker to perform simple task with free UE.  Discussed with patient that tomorrow she would stand from EOB to pull up her pants instead of utilizing lateral leans.  Pt in agreement.  Pt requires extra to "prepare" for transfers but is able to complete all transfers with steady A/min A.   Therapy Documentation Precautions:  Precautions Precautions: Fall Precaution Comments: avoid prolonged knee or hip flexion surgical side, KI in place this eval Required Braces or Orthoses: Knee Immobilizer - Right Restrictions Weight Bearing Restrictions: Yes LLE Weight Bearing: Non weight bearing   Pain: Pain Assessment Pain Assessment: No/denies pain Pain Score: 0-No pain  See FIM for current functional status  Therapy/Group: Individual Therapy  Leroy Libman 07/06/2013, 11:02 AM

## 2013-07-06 NOTE — Progress Notes (Signed)
Subjective/Complaints: Still some itching. Didn't get the sarna lotion.  A 12 point review of systems has been performed and if not noted above is otherwise negative.  Objective: Vital Signs: Blood pressure 142/85, pulse 94, temperature 97.9 F (36.6 C), temperature source Oral, resp. rate 19, height 5' 6.5" (1.689 m), weight 100.4 kg (221 lb 5.5 oz), last menstrual period 05/22/2013, SpO2 98.00%. No results found. No results found for this basename: WBC, HGB, HCT, PLT,  in the last 72 hours  Recent Labs  07/03/13 0910  NA 132*  K 4.7  CL 101  GLUCOSE 149*  BUN 45*  CREATININE 2.47*  CALCIUM 8.8   CBG (last 3)   Recent Labs  07/05/13 1630 07/05/13 2039 07/06/13 0721  GLUCAP 156* 144* 137*    Wt Readings from Last 3 Encounters:  07/06/13 100.4 kg (221 lb 5.5 oz)  06/30/13 106.6 kg (235 lb 0.2 oz)  06/30/13 106.6 kg (235 lb 0.2 oz)    Physical Exam:  Constitutional: She is oriented to person, place, and time. She appears well-developed and well-nourished. obese  HENT:  Head: Normocephalic and atraumatic.  Right Ear: External ear normal.  Left Ear: External ear normal.  Eyes: Conjunctivae and EOM are normal. Pupils are equal, round, and reactive to light.  Neck: Normal range of motion. Neck supple. No JVD present. No tracheal deviation present. No thyromegaly present.  Cardiovascular: Normal rate and regular rhythm.  No murmur heard.  Pulmonary/Chest: No respiratory distress. She has no wheezes. She has no rales.  Decreased breath sounds at the bases but clear to auscultation.  Abdominal: Soft. Bowel sounds are normal. She exhibits no distension.  Musculoskeletal:  Left leg  expectedly tender to touch and ROM. Able to lift left leg off bed.  Lymphadenopathy:  She has no cervical adenopathy.  Neurological: She is alert and oriented to person, place, and time. No cranial nerve deficit. Coordination normal.  Patient follows commands. UE strenght 5/5 bilaterally. RLE  is 4/5 HF,KE to 5/5 at ankle. LLE HF  2+. Skin:  Left BKA site is clean and intact. No drainage. Multiple abrasions, old poc-like scars/lesions on limbs, chest Psychiatric: She has a normal mood and affect. Her behavior is normal. Thought content normal   Assessment/Plan: 1. Functional deficits secondary to left BKA which require 3+ hours per day of interdisciplinary therapy in a comprehensive inpatient rehab setting. Physiatrist is providing close team supervision and 24 hour management of active medical problems listed below. Physiatrist and rehab team continue to assess barriers to discharge/monitor patient progress toward functional and medical goals.  Ramp being placed at home per pt.  FIM: FIM - Bathing Bathing Steps Patient Completed: Chest;Right Arm;Left Arm;Abdomen;Front perineal area;Buttocks;Right upper leg;Left upper leg;Right lower leg (including foot) Bathing: 5: Supervision: Safety issues/verbal cues  FIM - Upper Body Dressing/Undressing Upper body dressing/undressing steps patient completed: Thread/unthread right sleeve of pullover shirt/dresss;Thread/unthread left sleeve of pullover shirt/dress;Put head through opening of pull over shirt/dress;Pull shirt over trunk Upper body dressing/undressing: 5: Set-up assist to: Obtain clothing/put away FIM - Lower Body Dressing/Undressing Lower body dressing/undressing steps patient completed: Thread/unthread right pants leg;Thread/unthread left pants leg;Pull pants up/down;Don/Doff right sock Lower body dressing/undressing: 5: Set-up assist to: Don/Doff TED stocking  FIM - Toileting Toileting steps completed by patient: Adjust clothing prior to toileting;Performs perineal hygiene Toileting: 3: Mod-Patient completed 2 of 3 steps  FIM - Radio producer Devices: Recruitment consultant Transfers: 4-To toilet/BSC: Min A (steadying Pt. > 75%);4-From toilet/BSC: Min  A (steadying Pt. > 75%)  FIM - Bed/Chair  Transfer Bed/Chair Transfer: 5: Supine > Sit: Supervision (verbal cues/safety issues);4: Bed > Chair or W/C: Min A (steadying Pt. > 75%)  FIM - Locomotion: Wheelchair Distance: 80' Locomotion: Wheelchair: 2: Travels 25 - 149 ft with supervision, cueing or coaxing FIM - Locomotion: Ambulation Locomotion: Ambulation Assistive Devices: Administrator Ambulation/Gait Assistance: 3: Mod assist Locomotion: Ambulation: 1: Two helpers  Comprehension Comprehension Mode: Auditory Comprehension: 7-Follows complex conversation/direction: With no assist  Expression Expression Mode: Verbal Expression: 7-Expresses complex ideas: With no assist  Social Interaction Social Interaction: 7-Interacts appropriately with others - No medications needed.  Problem Solving Problem Solving: 7-Solves complex problems: Recognizes & self-corrects  Memory Memory: 7-Complete Independence: No helper  Medical Problem List and Plan:  1. 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 DVT Prophylaxis/Anticoagulation: Subcutaneous heparin.  3. Pain Management: Oxycodone as needed.  keep stump wrapped and protected  4. Neuropsych: This patient is capable of making decisions on her own behalf.  5. Chronic anemia.  hemoglobin 8.4.  . Latest hemoglobin prior to admission 8.6  6. Acute CHF/bronchitis. Lasix 80 mg twice daily. No complaints of shortness of breath.   Strict I and O.'s. Weight 100kg  7. Diabetes mellitus with peripheral neuropathy. Hemoglobin A1c 6.6. Presently with sliding scale insulin. Patient on Lantus insulin 18 units each bedtime prior to admission. Sugars under control 8. Hypertension: Coreg 12.5 mg twice a day, Bidil 0.5 mg twice a day. Monitor with increased mobility  9. Chronic renal insufficiency. CR 2.47. Strict I and O.'s.---encourage fluids 10. GERD. Protonix 11. Derm--small papular/poc-like lesions---?chronic, almost look like insect bites  -contact derm  -sarna lotion  LOS (Days)  6 A FACE TO FACE EVALUATION WAS PERFORMED  Ankit Degregorio T 07/06/2013 7:47 AM

## 2013-07-06 NOTE — Progress Notes (Signed)
Physical Therapy Session Note  Patient Details  Name: Karen [name] MRN: [id number] Date of Birth: December 06, 1975  Today's Date: 07/06/2013 Time: 1120-1200 Time Calculation (min): 40 min  Short Term Goals: Week 1:  PT Short Term Goal 1 (Week 1): Patient will be able to perform bed mobility with min-assist PT Short Term Goal 2 (Week 1): Patient will be able to perform Transfers with min-assist PT Short Term Goal 3 (Week 1): Patient will be able to perform wheel chair management with min-assist and prope x 150'  Skilled Therapeutic Interventions/Progress Updates:    Pt received sitting in wheelchair sleeping. Pt required several cues to arouse and get oriented to participating in therapy pt states she feels very tired, denies pain and states she has not had pain meds since last night. Pt performed w/c mobility on and around the unit, requires increased time and rests several times stating it makes her shoulders sore. Pt performed transfers w/c<>mat , pt displayed difficulty sit <>stand this session from lower surfaces requiring mod A, pt states she feels weakness in her R leg. Attempted to have pt perform AROM but pt reported the need to have a bowel movement. Pt performed w/c to [facility] on R side with +2 A to get on as she initially displayed difficulty to get off BSC to w/c pt was able to utilize RW to sit <>stand and was able to take several side steps to get in position to get back in w/c.   Therapy Documentation Precautions:  Precautions Precautions: Fall Precaution Comments: avoid prolonged knee or hip flexion surgical side, KI in place this eval Required Braces or Orthoses: Knee Immobilizer - Restrictions Weight Bearing Restrictions: Yes LLE Weight Bearing: Non weight bearing    Vital Signs: Therapy Vitals BP: 163/96 mmHg  HR 87 SpO2 99% on room air Patient Position, if appropriate: Sitting at rest Pain: Pain Assessment Pain Assessment: No/denies pain Pain Score: 0-No pain     Locomotion : Ambulation Ambulation/Gait Assistance: 3: Mod assist             See FIM for current functional status  Therapy/Group: Individual Therapy  Jeanette Caprice 07/06/2013, 12:28 PM

## 2013-07-06 NOTE — Progress Notes (Signed)
Occupational Therapy Session Note  Patient Details  Name: Karen [name] MRN: [id number] Date of Birth: 1976-07-23  Today's Date: 07/06/2013 Time: 1400-1430 Time Calculation (min): 30 min  Short Term Goals: Week 1:  OT Short Term Goal 1 (Week 1): Pt will perform LB bathing with mod assist sit to stand from the wheelchair. OT Short Term Goal 2 (Week 1): Pt will pull pants/underpants over hips with mod assist during toileting or dressing, sit to stand. OT Short Term Goal 3 (Week 1): Pt will perform transfer to 3:1 with min a squat pivot vs RW stand pivot. OT Short Term Goal 4 (Week 1): Pt will donn KI independently in order to avoid L knee contracture.  Skilled Therapeutic Interventions/Progress Updates:  1:1 tx. No pain report pre/post tx. Pt on bedside commode upon entering room. Nsg had stated that pt required 2 person assist from w/c to commode. Pt initially requesting assist from therapist for hygiene; however, after encouragement pt (I) w/hygiene. Pt able to stand w/steadying assist using RW to perform clothing mgmt. Steady assist SPT using RW BSC>w/c. Educated pt re: obtaining bathroom doorway measurements and height of car. Educated pt re: shower set-up for residual limb and transfer technique for in/out of shower stall in room. Pt initially hesitant about thought of performing shower txfr. She declined attempt but was able to verbally provide repeat instructions. Pt left in w/c w/call light & phone w/in reach. RN notified that pt should not require 2 person assist in future but may need encouragement and increased time to problem solve task.      Therapy Documentation Precautions:  Precautions Precautions: Fall Precaution Comments: avoid prolonged knee or hip flexion surgical side, KI in place this eval Required Braces or Orthoses: Knee Immobilizer - Right Restrictions Weight Bearing Restrictions: Yes LLE Weight Bearing: Non weight bearing  See FIM for current functional  status  Therapy/Group: Individual Therapy  Poppi Scantling, Scot Jun 07/06/2013, 3:28 PM

## 2013-07-06 NOTE — Progress Notes (Signed)
Physical Therapy Session Note  Patient Details  Name: Karen [name] MRN: [id number] Date of Birth: 1976/05/05  Today's Date: 07/06/2013 Time: 0830-0930 Time Calculation (min): 60 min  Short Term Goals: Week 1:  PT Short Term Goal 1 (Week 1): Patient will be able to perform bed mobility with min-assist PT Short Term Goal 2 (Week 1): Patient will be able to perform Transfers with min-assist PT Short Term Goal 3 (Week 1): Patient will be able to perform wheel chair management with min-assist and prope x 150'  Skilled Therapeutic Interventions/Progress Updates:    Irritable this morning but then more happy as session progressed. Scoot pivot transfer bed > bedside commode, stand pivot with RW bedside commode to wheelchair both with min assist, min verbal cues for sequencing and mechanics. Wheelchair propulsion over various surfaces and tight space negotiation for wheelchair management progressing to endurance task, x 150', 200'. Ambulation x 30' with RW and min/mod assist, encouragement for distance. Wheelchair to follow. Good pain management.   Therapy Documentation Precautions:  Precautions Precautions: Fall Precaution Comments: avoid prolonged knee or hip flexion surgical side, KI in place this eval Required Braces or Orthoses: Knee Immobilizer - Right Restrictions Weight Bearing Restrictions: Yes LLE Weight Bearing: Non weight bearing Pain: Pain Assessment Pain Assessment: No/denies pain Pain Score: 0-No pain  See FIM for current functional status  Therapy/Group: Individual Therapy  [name] 07/06/2013, 12:11 PM

## 2013-07-07 ENCOUNTER — Inpatient Hospital Stay (HOSPITAL_COMMUNITY): Payer: Medicaid Other | Admitting: Physical Therapy

## 2013-07-07 ENCOUNTER — Inpatient Hospital Stay (HOSPITAL_COMMUNITY): Payer: Medicaid Other | Admitting: *Deleted

## 2013-07-07 ENCOUNTER — Inpatient Hospital Stay (HOSPITAL_COMMUNITY): Payer: Medicaid Other

## 2013-07-07 LAB — GLUCOSE, CAPILLARY
Glucose-Capillary: 98 mg/dL (ref 70–99)
Glucose-Capillary: 108 mg/dL — ABNORMAL HIGH (ref 70–99)
Glucose-Capillary: 130 mg/dL — ABNORMAL HIGH (ref 70–99)
Glucose-Capillary: 176 mg/dL — ABNORMAL HIGH (ref 70–99)

## 2013-07-07 MED ORDER — CARVEDILOL 25 MG PO TABS
25.0000 mg | ORAL_TABLET | Freq: Two times a day (BID) | ORAL | Status: DC
  Administered 2013-07-07 – 2013-07-13 (×12): 25 mg via ORAL
  Filled 2013-07-07 (×15): qty 1

## 2013-07-07 NOTE — Progress Notes (Signed)
Social Work Patient ID: Karen [name], female   DOB: 20-Apr-1976, 37 y.o.   MRN: [id number]  Have spoken with pt's friend, Zane Herald, who confirms that pt's church has already begun work on ramp at [employer] home and are open to assist with other needs (i.e. Also purchasing hand held shower)  [name], [place], LCSW

## 2013-07-07 NOTE — Progress Notes (Signed)
Patient refused all meds, but her Coreg. Educated patient on the importance of taking her meds. She stated, "I know, I know, I know." Silvestre Mesi, PA is aware. Thayer Ohm A 07/07/2013]

## 2013-07-07 NOTE — Progress Notes (Signed)
Occupational Therapy Note   Patient Details  Name: Karen [name] MRN: [id number] Date of Birth: 02-02-1976 Today's Date: 07/07/2013  Time: 1415-1500  (20min) Pain:  0/10 Individual session  Engaged in functional mobility, UE strength, transfers.  Pt. Ambulated about 15 feet to bathroom and used regular toilet and grab bar to sit.  Pt was mod assist with balance with walker.  Needed assist with donning, doffing pants.  Propelled wc about 75 feet before OT assisted.  Pt used arm bike for 17 minutes at 4 resistance level.  Pt. Reported going backwards was harder.  Propelled wc back to room and Left pt in recliner call bell,phone within reach.      Pacita, Vena 07/07/2013, 2:49 PM

## 2013-07-07 NOTE — Progress Notes (Signed)
Occupational Therapy Session Note  Patient Details  Name: Karen [name] MRN: [id number] Date of Birth: July 17, 1976  Today's Date: 07/07/2013 Time: 0800-0900 Time Calculation (min): 60 min  Short Term Goals: Week 1:  OT Short Term Goal 1 (Week 1): Pt will perform LB bathing with mod assist sit to stand from the wheelchair. OT Short Term Goal 2 (Week 1): Pt will pull pants/underpants over hips with mod assist during toileting or dressing, sit to stand. OT Short Term Goal 3 (Week 1): Pt will perform transfer to 3:1 with min a squat pivot vs RW stand pivot. OT Short Term Goal 4 (Week 1): Pt will donn KI independently in order to avoid L knee contracture.  Skilled Therapeutic Interventions/Progress Updates:    Pt asleep in w/c upon arrival.  Pt stated she slept in her w/c all night because the sheets on the bed made her sweat.  Pt declined bathing and dressing this morning stating that she had an "accident" last night and nursing had helped her clean up good and change clothing.  Pt requested to use BSC and required mod A for toilet transfer and steady A for toileting.  Pt completed grooming tasks at sink before transitioning to therapy gym for BUE therex with theraband and 4# weight bar.  Pt rolled back to room at end of session and remained in w/c with all needs within reach.  Therapy Documentation Precautions:  Precautions Precautions: Fall Precaution Comments: avoid prolonged knee or hip flexion surgical side, KI in place this eval Required Braces or Orthoses: Knee Immobilizer - Right Restrictions Weight Bearing Restrictions: Yes LLE Weight Bearing: Non weight bearing Pain: Pain Assessment Pain Assessment: No/denies pain  See FIM for current functional status  Therapy/Group: Individual Therapy  Leroy Libman 07/07/2013, 9:16 AM

## 2013-07-07 NOTE — Progress Notes (Signed)
Physical Therapy Session Note  Patient Details  Name: Karen [name] MRN: [id number] Date of Birth: 1976-02-25  Today's Date: 07/07/2013 Time: [id number] Time Calculation (min): 58 min  Short Term Goals: Week 1:  PT Short Term Goal 1 (Week 1): Patient will be able to perform bed mobility with min-assist PT Short Term Goal 2 (Week 1): Patient will be able to perform Transfers with min-assist PT Short Term Goal 3 (Week 1): Patient will be able to perform wheel chair management with min-assist and prope x 150'  Skilled Therapeutic Interventions/Progress Updates:    Finishing up on lunch upon entry, still up in recliner with LEs elevated. Due to some persistent swelling discussed use of TEDS with RN (some concern due to elevated BP). Agreed to take TEDs off if pt's diastolic BP > [id number] during session. Pt's diastolic BP remained below 100 with and without TEDS throughout session.   Session focused on transfer training and sit <> stands. Scoot pivot and stand pivot transfers to/from recliner and mat (supervision to mod assist, inconsistent with need for assist) practicing wheelchair set up and parts management (mod verbal cues). Sit <> stands from decreasing mat height min/S assist. Pt continues to need cues for UE placement, facilitation for anterior weight shift into standing. Increased time for all tasks.   Therapy Documentation Precautions:  Precautions Precautions: Fall Precaution Comments: avoid prolonged knee or hip flexion surgical side, KI in place this eval Required Braces or Orthoses: Knee Immobilizer - Right Restrictions Weight Bearing Restrictions: Yes LLE Weight Bearing: Non weight bearing Pain: Pain Assessment Pain Assessment: No/denies pain  See FIM for current functional status  Therapy/Group: Individual Therapy  [name] 07/07/2013, 3:41 PM

## 2013-07-07 NOTE — Progress Notes (Signed)
Subjective/Complaints: No specific complaints. Did well in therapy yesterday. Wants to know when staples will come out  A 12 point review of systems has been performed and if not noted above is otherwise negative.  Objective: Vital Signs: Blood pressure 153/93, pulse 83, temperature 98 F (36.7 C), temperature source Oral, resp. rate 18, height 5' 6.5" (1.689 m), weight 100.4 kg (221 lb 5.5 oz), last menstrual period 05/22/2013, SpO2 100.00%. No results found. No results found for this basename: WBC, HGB, HCT, PLT,  in the last 72 hours No results found for this basename: NA, K, CL, CO, GLUCOSE, BUN, CREATININE, CALCIUM,  in the last 72 hours CBG (last 3)   Recent Labs  07/06/13 1621 07/06/13 2006 07/07/13 0720  GLUCAP 163* 117* 98    Wt Readings from Last 3 Encounters:  07/06/13 100.4 kg (221 lb 5.5 oz)  06/30/13 106.6 kg (235 lb 0.2 oz)  06/30/13 106.6 kg (235 lb 0.2 oz)    Physical Exam:  Constitutional: She is oriented to person, place, and time. She appears well-developed and well-nourished. obese  HENT:  Head: Normocephalic and atraumatic.  Right Ear: External ear normal.  Left Ear: External ear normal.  Eyes: Conjunctivae and EOM are normal. Pupils are equal, round, and reactive to light.  Neck: Normal range of motion. Neck supple. No JVD present. No tracheal deviation present. No thyromegaly present.  Cardiovascular: Normal rate and regular rhythm.  No murmur heard.  Pulmonary/Chest: No respiratory distress. She has no wheezes. She has no rales.  Decreased breath sounds at the bases but clear to auscultation.  Abdominal: Soft. Bowel sounds are normal. She exhibits no distension.  Musculoskeletal:  Left leg  expectedly tender to touch and ROM. Able to lift left leg off bed.  Lymphadenopathy:  She has no cervical adenopathy.  Neurological: She is alert and oriented to person, place, and time. No cranial nerve deficit. Coordination normal.  Patient follows commands.  UE strenght 5/5 bilaterally. RLE is 4/5 HF,KE to 5/5 at ankle. LLE HF  2+. Skin:  Left BKA site is clean and intact. No drainage. Multiple abrasions, old poc-like scars/lesions on limbs, chest Psychiatric: She has a normal mood and affect. Her behavior is normal. Thought content normal   Assessment/Plan: 1. Functional deficits secondary to left BKA which require 3+ hours per day of interdisciplinary therapy in a comprehensive inpatient rehab setting. Physiatrist is providing close team supervision and 24 hour management of active medical problems listed below. Physiatrist and rehab team continue to assess barriers to discharge/monitor patient progress toward functional and medical goals.  Wound looks great. Still another 10-14 days away from removing some of staples.   FIM: FIM - Bathing Bathing Steps Patient Completed: Chest;Right Arm;Left Arm;Abdomen;Front perineal area;Buttocks;Right upper leg;Left upper leg;Right lower leg (including foot) Bathing: 5: Supervision: Safety issues/verbal cues  FIM - Upper Body Dressing/Undressing Upper body dressing/undressing steps patient completed: Thread/unthread right sleeve of pullover shirt/dresss;Thread/unthread left sleeve of pullover shirt/dress;Put head through opening of pull over shirt/dress;Pull shirt over trunk Upper body dressing/undressing: 5: Set-up assist to: Obtain clothing/put away FIM - Lower Body Dressing/Undressing Lower body dressing/undressing steps patient completed: Thread/unthread right pants leg;Thread/unthread left pants leg;Pull pants up/down;Don/Doff right sock Lower body dressing/undressing: 5: Set-up assist to: Obtain clothing  FIM - Toileting Toileting steps completed by patient: Adjust clothing prior to toileting;Performs perineal hygiene Toileting: 3: Mod-Patient completed 2 of 3 steps  FIM - Radio producer Devices: Recruitment consultant Transfers: 4-To toilet/BSC: Min A (steadying  Pt. > 75%);4-From toilet/BSC: Min A (steadying Pt. > 75%)  FIM - Bed/Chair Transfer Bed/Chair Transfer: 5: Supine > Sit: Supervision (verbal cues/safety issues);4: Bed > Chair or W/C: Min A (steadying Pt. > 75%);4: Chair or W/C > Bed: Min A (steadying Pt. > 75%)  FIM - Locomotion: Wheelchair Distance: 80' Locomotion: Wheelchair: 5: Travels 150 ft or more: maneuvers on rugs and over door sills with supervision, cueing or coaxing FIM - Locomotion: Ambulation Locomotion: Ambulation Assistive Devices: Administrator Ambulation/Gait Assistance: 3: Mod assist Locomotion: Ambulation: 1: Travels less than 50 ft with moderate assistance (Pt: 50 - 74%)  Comprehension Comprehension Mode: Auditory Comprehension: 7-Follows complex conversation/direction: With no assist  Expression Expression Mode: Verbal Expression: 7-Expresses complex ideas: With no assist  Social Interaction Social Interaction: 7-Interacts appropriately with others - No medications needed.  Problem Solving Problem Solving: 7-Solves complex problems: Recognizes & self-corrects  Memory Memory: 7-Complete Independence: No helper  Medical Problem List and Plan:  1. 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 DVT Prophylaxis/Anticoagulation: Subcutaneous heparin.  3. Pain Management: Oxycodone as needed.  keep stump wrapped and protected  4. Neuropsych: This patient is capable of making decisions on her own behalf.  5. Chronic anemia.  hemoglobin 8.4.  . Latest hemoglobin prior to admission 8.6  6. Acute CHF/bronchitis. Lasix 80 mg twice daily. No complaints of shortness of breath.   Strict I and O.'s. Weight 100kg  7. Diabetes mellitus with peripheral neuropathy. Hemoglobin A1c 6.6. Presently with sliding scale insulin. Patient on Lantus insulin 18 units each bedtime prior to admission. Sugars under control 8. Hypertension: Coreg 12.5 mg twice a day, Bidil 0.5 mg twice a day. Increase coreg to 25mg  9. Chronic renal  insufficiency. CR 2.47. Strict I and O.'s.---encouraging fluids 10. GERD. Protonix 11. Derm--small papular/poc-like lesions---?insect bites  -trying to contact derm  -sarna lotion  LOS (Days) 7 A FACE TO FACE EVALUATION WAS PERFORMED  Padraig Nhan T 07/07/2013 7:44 AM

## 2013-07-07 NOTE — Progress Notes (Signed)
Patient refused to take Heparin shots, Colace, Senna 17.2 mg and Kenalog for the skin. Patient also refusing to sleep in the bed. Attempted several times and encourage to go back to bed but kept refusing.Patient remains sitting in the wheelchair with face leaning to the bedside table.  MD notified . Next shift nurse notified. Patient stated she has stomachache. Percocet and Benadryl given with good effect noted.Call bell within reach.

## 2013-07-07 NOTE — Progress Notes (Signed)
Physical Therapy Session Note  Patient Details  Name: Karen [name] MRN: [id number] Date of Birth: 07-18-1976  Today's Date: 07/07/2013 Time: 1100-1130 Time Calculation (min): 30 min  Short Term Goals: Week 1:  PT Short Term Goal 1 (Week 1): Patient will be able to perform bed mobility with min-assist PT Short Term Goal 2 (Week 1): Patient will be able to perform Transfers with min-assist PT Short Term Goal 3 (Week 1): Patient will be able to perform wheel chair management with min-assist and prope x 150'  Skilled Therapeutic Interventions/Progress Updates:    Increased time to initiate therapies and for all tasks. Wheelchair propulsion x 200' for endurance and strengthening, supervision. Wheelchair parts management with supervision/min assist and mod verbal cues. Ambulation x 20' with RW, limited by Rt. LE "tightness", swollen from sleeping in wheelchair as she does not like the sheets on her bed. RN made aware of request for cotton sheets, RN and pt aware of recommendation to sleep in bed or recliner but to avoid sleeping in wheelchair due to dependent positions of LEs. Pt transferred to recliner via stand pivot at end of session with min/mod assist, bil. LEs elevated with Lt. Knee in extension.   Therapy Documentation Precautions:  Precautions Precautions: Fall Precaution Comments: avoid prolonged knee or hip flexion surgical side, KI in place this eval Required Braces or Orthoses: Knee Immobilizer - Right Restrictions Weight Bearing Restrictions: Yes LLE Weight Bearing: Non weight bearing Pain: Pain Assessment Pain Assessment: No/denies pain  See FIM for current functional status  Therapy/Group: Individual Therapy  [name] 07/07/2013, 11:50 AM

## 2013-07-08 ENCOUNTER — Inpatient Hospital Stay (HOSPITAL_COMMUNITY): Payer: Medicaid Other | Admitting: Physical Therapy

## 2013-07-08 LAB — GLUCOSE, CAPILLARY: Glucose-Capillary: 104 mg/dL — ABNORMAL HIGH (ref 70–99)

## 2013-07-08 NOTE — Progress Notes (Signed)
Subjective/Complaints: Generally feels well No specific complaints except for pain at the stump Objective: Vital Signs: BP 127/81  Pulse 88  Temp(Src) 98.1 F (36.7 C) (Oral)  Resp 20  Ht 5' 6.5" (1.689 m)  Wt 230 lb (104.327 kg)  BMI 36.57 kg/m2  SpO2 94%  LMP 05/22/2013  No acute distress. Overweight female. Chest clear to auscultation. Cardiac exam S1-S2 are regular. Abdominal exam obese, and bowel sounds, soft. Extremities right BKA, left lower extremity with 1-2+ edema to the midcalf.  Recent Labs  07/07/13 1138 07/07/13 1657 07/07/13 2136  GLUCAP 108* 130* 176*    Wt Readings from Last 3 Encounters:  07/08/13 230 lb (104.327 kg)  06/30/13 235 lb 0.2 oz (106.6 kg)  06/30/13 235 lb 0.2 oz (106.6 kg)   Assessment/Plan: 1. Functional deficits secondary to left BKA   Medical Problem List and Plan:  1. 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 DVT Prophylaxis/Anticoagulation: Subcutaneous heparin.  3. Pain Management: Oxycodone as needed.  keep stump wrapped and protected  4. Neuropsych: This patient is capable of making decisions on her own behalf.  5. Chronic anemia.   Lab Results  Component Value Date   HGB 8.4* 07/03/2013   6. Acute CHF/bronchitis (ejection fraction 40-45%).. Symptoms have resolved. She denies shortness of breath. 7. Diabetes mellitus with peripheral neuropathy. Hemoglobin A1c 6.6. Presently with sliding scale insulin. Patient on Lantus insulin 18 units each bedtime prior to admission. Sugars under control 8. Hypertension: fair control  BP Readings from Last 3 Encounters:  07/08/13 127/81  06/30/13 142/88  06/30/13 142/88   9. Chronic renal insufficiency. Lab Results  Component Value Date   CREATININE 2.47* 07/03/2013   10. GERD. Protonix 11. Derm--small papular/poc-like lesions---?insect bites    LOS (Days) 8 A FACE TO FACE EVALUATION WAS PERFORMED  [facility] HENRY 07/08/2013 6:56 AM

## 2013-07-08 NOTE — Progress Notes (Signed)
Patient request for hypoallergenic sheets for skin  rash and itching. Dr. Leanne Chang notified and give a verbal order.

## 2013-07-08 NOTE — Progress Notes (Signed)
Physical Therapy Note  Patient Details  Name: Karen [name] MRN: [id number] Date of Birth: 07-17-1976 Today's Date: 07/08/2013  [id number] (40 minutes) individual Pain: no reported pain/ premedicated Focus of treatment: Gait training / activity tolerance; wc mobility training/ activity tolerance Other : hyper allergic linen Treatment: Pt in bed upon arrival; requests to use BSC; bed >< BSC scoot to squat/pivot SBA post setup; wc mobility 120 feet X 2 SBA on level ; sit to stand min to SBA ; gait 15 feet X 2 min assist for safety (not followed by wc) ; wc pushups 2 X 5.    Matti Minney,JIM 07/08/2013, 10:08 AM

## 2013-07-09 ENCOUNTER — Inpatient Hospital Stay (HOSPITAL_COMMUNITY): Payer: Medicaid Other | Admitting: Occupational Therapy

## 2013-07-09 LAB — GLUCOSE, CAPILLARY
Glucose-Capillary: 101 mg/dL — ABNORMAL HIGH (ref 70–99)
Glucose-Capillary: 129 mg/dL — ABNORMAL HIGH (ref 70–99)
Glucose-Capillary: 145 mg/dL — ABNORMAL HIGH (ref 70–99)
Glucose-Capillary: 107 mg/dL — ABNORMAL HIGH (ref 70–99)
Glucose-Capillary: 91 mg/dL (ref 70–99)

## 2013-07-09 MED ORDER — ISOSORB DINITRATE-HYDRALAZINE 20-37.5 MG PO TABS
1.0000 | ORAL_TABLET | Freq: Two times a day (BID) | ORAL | Status: DC
  Administered 2013-07-09 – 2013-07-13 (×7): 1 via ORAL
  Filled 2013-07-09 (×10): qty 1

## 2013-07-09 NOTE — Progress Notes (Addendum)
Subjective/Complaints: Generally feels well No specific complaints Objective: Vital Signs: BP 138/98  Pulse 85  Temp(Src) 98 F (36.7 C) (Oral)  Resp 18  Ht 5' 6.5" (1.689 m)  Wt 218 lb 1.6 oz (98.93 kg)  BMI 34.68 kg/m2  SpO2 96%  LMP 05/22/2013  No acute distress. Overweight female. Chest clear to auscultation. Cardiac exam S1-S2 are regular. Abdominal exam obese, and bowel sounds, soft. Extremities right BKA (stump is wrapped), left lower extremity with 1-2+ edema to the midcalf.  Recent Labs  07/08/13 1634 07/08/13 2103 07/09/13 0707  GLUCAP 136* 122* 101*    Wt Readings from Last 3 Encounters:  07/09/13 218 lb 1.6 oz (98.93 kg)  06/30/13 235 lb 0.2 oz (106.6 kg)  06/30/13 235 lb 0.2 oz (106.6 kg)   Assessment/Plan: 1. Functional deficits secondary to left BKA   Medical Problem List and Plan:  1. 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 Will need outpatient followup with her orthopedist. 2. DVT Prophylaxis/Anticoagulation: Subcutaneous heparin.  3. Pain Management: Oxycodone as needed.  keep stump wrapped and protected  4. Neuropsych: This patient is capable of making decisions on her own behalf.  5. Chronic anemia.   Lab Results  Component Value Date   HGB 8.4* 07/03/2013   6. Acute CHF/bronchitis (ejection fraction 40-45%).. Symptoms have resolved. She denies shortness of breath. 7. Diabetes mellitus with peripheral neuropathy. Hemoglobin A1c 6.6. Presently with sliding scale insulin. Patient on Lantus insulin 18 units each bedtime prior to admission. Sugars under control 8. Hypertension: fair control - blood pressure ranging 138-142/80 -98. Needs better control. Will increase bidil 9. Chronic renal insufficiency. Lab Results  Component Value Date   CREATININE 2.47* 07/03/2013   10. GERD. Protonix     LOS (Days) 9 A FACE TO FACE EVALUATION WAS PERFORMED  Karen [name] 07/09/2013 8:26 AM

## 2013-07-09 NOTE — Progress Notes (Signed)
Occupational Therapy Session Note  Patient Details  Name: Karen [name] MRN: [id number] Date of Birth: Oct 30, 1975  Today's Date: 07/09/2013 Time: 0820-0905 Time Calculation (min): 45 min  \Skilled Therapeutic Interventions/Progress Updates: Initially patient stated she had already bathed sufficiently when she had a bowel movement earlier with nursing and that she did not need or desire to do any more bathing or dressing or other therapy today.  As well, she stated that she would go into "prayer in a few minutes because that is when my church does it."  Though this session was interruptive to what the patient stated she had planned spiritually, she decided to go ahead and complete the ADL anyway.                     She completed bathing and dressing EOB and elected with lateral leans for peribathing.     At the end of the session, patient layed back onto her bed with call bell and phone and spiritual music in place.     Therapy Documentation Precautions:  Precautions Precautions: Fall Precaution Comments: avoid prolonged knee or hip flexion surgical side, KI in place this eval Required Braces or Orthoses: Knee Immobilizer - Right Restrictions Weight Bearing Restrictions: Yes LLE Weight Bearing: Non weight bearing Pain: Pain Assessment Pain Assessment: No/denies pain   See FIM for current functional status  Therapy/Group: Individual Therapy  Alfredia Ferguson [facility] 07/09/2013, 11:40 AM

## 2013-07-09 NOTE — Progress Notes (Signed)
Patient refused 0600 dose Heparin.

## 2013-07-10 ENCOUNTER — Inpatient Hospital Stay (HOSPITAL_COMMUNITY): Payer: Medicaid Other | Admitting: Occupational Therapy

## 2013-07-10 ENCOUNTER — Inpatient Hospital Stay (HOSPITAL_COMMUNITY): Payer: Medicaid Other | Admitting: Physical Therapy

## 2013-07-10 ENCOUNTER — Inpatient Hospital Stay (HOSPITAL_COMMUNITY): Payer: Medicaid Other

## 2013-07-10 LAB — GLUCOSE, CAPILLARY
Glucose-Capillary: 131 mg/dL — ABNORMAL HIGH (ref 70–99)
Glucose-Capillary: 131 mg/dL — ABNORMAL HIGH (ref 70–99)
Glucose-Capillary: 139 mg/dL — ABNORMAL HIGH (ref 70–99)

## 2013-07-10 MED ORDER — NON FORMULARY: 2.0000 [IU] | Freq: Three times a day (TID) | Status: DC

## 2013-07-10 MED ORDER — INSULIN REGULAR HUMAN 100 UNIT/ML IJ SOLN
2.0000 [IU] | Freq: Three times a day (TID) | INTRAMUSCULAR | Status: DC
  Administered 2013-07-11: 2 [IU] via SUBCUTANEOUS
  Filled 2013-07-10 (×26): qty 0.1

## 2013-07-10 NOTE — Progress Notes (Signed)
Occupational Therapy Weekly Progress Note  Patient Details  Name: Karen [name] MRN: [id number] Date of Birth: 1976/01/28  Today's Date: 07/10/2013  Patient has met 3 of 4 short term goals.  Pt has made steady progress with BADLs since admission.  Pt is supervision for bathing, dressing, toilet transfers and toileting, and tub bench transfers.  Pt currently completes bathing and dressing with sit<>stand from EOB.  Pt unsure if w/c will fit into bathroom at home.  Pt will require supervision/min A to access bathroom with RW.   Patient continues to demonstrate the following deficits: BUE weakness, decreased activity tolerance, decreased standing balance and transfers  and therefore will continue to benefit from skilled OT intervention to enhance overall performance with BADL, iADL and Reduce care partner burden.  Patient progressing toward long term goals..  Continue plan of care.  OT Short Term Goals Week 1:  OT Short Term Goal 1 (Week 1): Pt will perform LB bathing with mod assist sit to stand from the wheelchair. OT Short Term Goal 1 - Progress (Week 1): Met OT Short Term Goal 2 (Week 1): Pt will pull pants/underpants over hips with mod assist during toileting or dressing, sit to stand. OT Short Term Goal 2 - Progress (Week 1): Met OT Short Term Goal 3 (Week 1): Pt will perform transfer to 3:1 with min a squat pivot vs RW stand pivot. OT Short Term Goal 3 - Progress (Week 1): Met OT Short Term Goal 4 (Week 1): Pt will donn KI independently in order to avoid L knee contracture. OT Short Term Goal 4 - Progress (Week 1): Discontinued (comment) (pt not wearing KI anymore) Week 2:  OT Short Term Goal 1 (Week 2): STG=LTG secondary to ELOS      Therapy Documentation Precautions:  Precautions Precautions: Fall Precaution Comments: avoid prolonged knee or hip flexion surgical side, KI in place this eval Required Braces or Orthoses: Knee Immobilizer - Right Restrictions Weight Bearing  Restrictions: Yes LLE Weight Bearing: Non weight bearing Pain: Pain Assessment Pain Assessment: No/denies pain  See FIM for current functional status  Therapy/Group: Individual Therapy  Leroy Libman 07/10/2013, 3:04 PM

## 2013-07-10 NOTE — Progress Notes (Signed)
NUTRITION FOLLOW-UP  DOCUMENTATION CODES Per approved criteria  -Obesity Unspecified   INTERVENTION: Continue Nepro Shakes PO BID. Encourage PO intake. RD to continue to follow nutrition care plan.  NUTRITION DIAGNOSIS: Inadequate oral intake related to poor appetite/nausea as evidenced by pt's report of eating 1 meal daily for past 9 months and recent weight loss. Improving.  Goal: Pt to meet >/= 90% of their estimated nutrition needs - improving  Monitor:  PO intake, weight trend, labs  ASSESSMENT: PMHx significant for hypertension, GERD, renal failure, and type 2 diabetes. Admitted to acute hospital on 9/30 with L foot pain, drainage and fever. 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 Admitted to inpatient rehab on 10/10.  Underwent BKA on 10/4. Current wt is 218 lb. Weight on admission to acute hospital was 219 lb, this is consistent with that weight.  She reports that she is currently eating fair - RN reports that pt sometimes refuses to eat and will go to cafeteria. Pt is also refusing her medications presently. Currently ordered for Nepro Shake PO BID. RN reports that pt consumed one this morning.  Per most recent conference note, plan for patient to go home on 10/24.  Height: Ht Readings from Last 1 Encounters:  06/30/13 5' 6.5" (1.689 m)    Weight: Wt Readings from Last 1 Encounters:  07/10/13 218 lb 11.2 oz (99.202 kg)    BMI:  39.5 - adjusted for BKA (Obese Class II)  Estimated Nutritional Needs: Kcal: 1900-2100 Protein: 80-90 grams Fluid: 1.9-2.1 L/day fluid restriction  Skin:  non-pitting RLE and LLE edema L leg incision (amputation site)  Diet Order: Carb Control Medium (1600-2000)  EDUCATION NEEDS: -Education needs addressed on 10/1 re: CHF nutrition therapy   Intake/Output Summary (Last 24 hours) at 07/10/13 1206 Last data filed at 07/10/13 0900  Gross per 24 hour  Intake    480 ml  Output      0 ml  Net    480 ml    Last BM: 10/19   Labs:  No  results found for this basename: NA, K, CL, CO2, BUN, CREATININE, CALCIUM, MG, PHOS, GLUCOSE,  in the last 168 hours  CBG (last 3)   Recent Labs  07/09/13 2319 07/10/13 0729 07/10/13 1152  GLUCAP 112* 94 131*    Scheduled Meds: . calcium carbonate  1 tablet Oral BID WC  . carvedilol  25 mg Oral BID WC  . docusate sodium  100 mg Oral BID  . feeding supplement (NEPRO CARB STEADY)  237 mL Oral BID BM  . heparin  5,000 Units Subcutaneous Q8H  . insulin aspart  0-9 Units Subcutaneous TID WC  . insulin glargine  5 Units Subcutaneous QHS  . isosorbide-hydrALAZINE  1 tablet Oral BID  . pantoprazole  40 mg Oral Q1200  . senna  2 tablet Oral BID  . sodium bicarbonate  650 mg Oral Daily  . torsemide  100 mg Oral Daily  . triamcinolone  1 application Topical TID    Continuous Infusions:  none  Inda Coke MS, RD, LDN Pager: [phone] After-hours pager: [phone]

## 2013-07-10 NOTE — Progress Notes (Signed)
Occupational Therapy Session Note  Patient Details  Name: Karen [name] MRN: [id number] Date of Birth: 1976-07-10  Today's Date: 07/10/2013 Time: [id number] Time Calculation (min): 30 min  Short Term Goals: Week 1:  OT Short Term Goal 1 (Week 1): Pt will perform LB bathing with mod assist sit to stand from the wheelchair. OT Short Term Goal 1 - Progress (Week 1): Met OT Short Term Goal 2 (Week 1): Pt will pull pants/underpants over hips with mod assist during toileting or dressing, sit to stand. OT Short Term Goal 2 - Progress (Week 1): Met OT Short Term Goal 3 (Week 1): Pt will perform transfer to 3:1 with min a squat pivot vs RW stand pivot. OT Short Term Goal 3 - Progress (Week 1): Met OT Short Term Goal 4 (Week 1): Pt will donn KI independently in order to avoid L knee contracture. OT Short Term Goal 4 - Progress (Week 1): Discontinued (comment) (pt not wearing KI anymore)  Skilled Therapeutic Interventions/Progress Updates:    Pt seen for 1:1 OT with focus on activity tolerance and BUE strengthening.  Pt reports sick to stomach and requesting to go outside to get fresh air.  Pt propelled w/c to elevator and backed in to elevator with min cues for positioning w/c when backing up.  Pt propelled w/c out door to outside and required physical assistance to direct w/c as approaching decline in sidewalk and unable to control w/c independently.  Discussed navigating uneven surfaces with w/c and use of automatic doors or asking for assistance when navigating doorways without assistance. Engaged in w/c pushups, 1 set of 10, with pt demonstrating increased BUE strengthen with improved clearance of buttocks with each pushup.  Pt returned to room and left in w/c with all items in reach.  Therapy Documentation Precautions:  Precautions Precautions: Fall Precaution Comments: avoid prolonged knee or hip flexion surgical side, KI in place this eval Required Braces or Orthoses: Knee Immobilizer -  Right Restrictions Weight Bearing Restrictions: Yes LLE Weight Bearing: Non weight bearing General: General Missed Time Reason: Other (comment) (off floor to get sandwich ) Vital Signs: Therapy Vitals BP: 150/94 mmHg Patient Position, if appropriate: Sitting Pain: Pain Assessment Pain Assessment: No/denies pain  See FIM for current functional status  Therapy/Group: Individual Therapy  Simonne Come 07/10/2013, 3:11 PM

## 2013-07-10 NOTE — Progress Notes (Signed)
Physical Therapy Session Note  Patient Details  Name: Karen [name] MRN: [id number] Date of Birth: 1975/12/27  Today's Date: 07/10/2013 Time: [id number] Time Calculation (min): 55 min  Short Term Goals: Week 1:  PT Short Term Goal 1 (Week 1): Patient will be able to perform bed mobility with min-assist PT Short Term Goal 2 (Week 1): Patient will be able to perform Transfers with min-assist PT Short Term Goal 3 (Week 1): Patient will be able to perform wheel chair management with min-assist and prope x 150'  Skilled Therapeutic Interventions/Progress Updates:    Pt off unit initially getting sandwich, encouraged pt to review therapy schedule as to not miss minutes. Therapist able to stay late and pt only missed 5 min.   Pt with low activity tolerance today d/t nausea and headache (BP 150/94, RN aware). Pt reports this came from yesterday's food. Ambulation 2 x 15' with RW over carpeted surface (more difficult for pt), sit <> stands from medium low compliant bed all at min assist on verge of supervision level. Wheelchair <> mat scoot transfer with supervision, pt able to set-up wheelchair including leg rests with increased time but no need for cues. Lt. Knee presses 2 x 10 reps, 10 sec holds educated pt on positioning and importance of maintaining full knee extension. Wheelchair propulsion on unit for improved endurance 2 x 100' with supervision.   Therapy Documentation Precautions:  Precautions Precautions: Fall Precaution Comments: avoid prolonged knee or hip flexion surgical side, KI in place this eval Required Braces or Orthoses: Knee Immobilizer - Right Restrictions Weight Bearing Restrictions: Yes LLE Weight Bearing: Non weight bearing General: Amount of Missed PT Time (min): 5 Minutes Missed Time Reason: Other (comment) (off floor to get sandwich ) Pain: Pain Assessment Pain Assessment: No/denies pain  See FIM for current functional status  Therapy/Group: Individual  Therapy  [name] 07/10/2013, 2:50 PM

## 2013-07-10 NOTE — Progress Notes (Signed)
Occupational Therapy Session Note  Patient Details  Name: Karen [name] MRN: [id number] Date of Birth: 1975/12/02  Today's Date: 07/10/2013 Time: [id number] Time Calculation (min): 45 min  Short Term Goals: Week 1:  OT Short Term Goal 1 (Week 1): Pt will perform LB bathing with mod assist sit to stand from the wheelchair. OT Short Term Goal 2 (Week 1): Pt will pull pants/underpants over hips with mod assist during toileting or dressing, sit to stand. OT Short Term Goal 3 (Week 1): Pt will perform transfer to 3:1 with min a squat pivot vs RW stand pivot. OT Short Term Goal 4 (Week 1): Pt will donn KI independently in order to avoid L knee contracture.  Skilled Therapeutic Interventions/Progress Updates:    Pt stated she was not feeling well, but agreed to participate. Pt worked on tub bench transfers in tub room to allow her to transfer to her right side.  She was able to transfer on and off bench with supervision only to ensure w/c would not slide as she was moving.  She simulated bathing on bench with lateral leans. Pt then went to gym to work on UE strengthening activities with light dumbells that she could do at home as part of her HEP.  Pt returned to room at end of session.  Therapy Documentation Precautions:  Precautions Precautions: Fall Precaution Comments: avoid prolonged knee or hip flexion surgical side, KI in place this eval Required Braces or Orthoses: Knee Immobilizer - Right Restrictions Weight Bearing Restrictions: Yes LLE Weight Bearing: Non weight bearing   Pain: Pain Assessment Pain Assessment: No/denies pain ADL:  See FIM for current functional status  Therapy/Group: Individual Therapy  [place] 07/10/2013, 12:16 PM

## 2013-07-10 NOTE — Progress Notes (Signed)
Occupational Therapy Session Note  Patient Details  Name: Karen [name] MRN: [id number] Date of Birth: 1975-10-09  Today's Date: 07/10/2013 Time: 0800-0900 Time Calculation (min): 60 min  Short Term Goals: Week 1:  OT Short Term Goal 1 (Week 1): Pt will perform LB bathing with mod assist sit to stand from the wheelchair. OT Short Term Goal 2 (Week 1): Pt will pull pants/underpants over hips with mod assist during toileting or dressing, sit to stand. OT Short Term Goal 3 (Week 1): Pt will perform transfer to 3:1 with min a squat pivot vs RW stand pivot. OT Short Term Goal 4 (Week 1): Pt will donn KI independently in order to avoid L knee contracture.  Skilled Therapeutic Interventions/Progress Updates:    Skilled intervention focus on bed mobility, dynamic sitting balance, BSC transfers, sit<>stand, standing balance to pull up pants, activity tolerance, and safety awareness.  Pt completed bathing and dressing with sit<>stand from EOB and standing to pull up pants.  Pt requested to use [facility] and performed all tasks with supervision including transfer.  Pt completed grooming tasks w/c level at sink.  Pt agreeable to completing bathing tasks at tub/shower level tomorrow.  Therapy Documentation Precautions:  Precautions Precautions: Fall Precaution Comments: avoid prolonged knee or hip flexion surgical side, KI in place this eval Required Braces or Orthoses: Knee Immobilizer - Right Restrictions Weight Bearing Restrictions: Yes LLE Weight Bearing: Non weight bearing   Pain: Pain Assessment Pain Assessment: No/denies pain  See FIM for current functional status  Therapy/Group: Individual Therapy  Leroy Libman 07/10/2013, 12:07 PM

## 2013-07-10 NOTE — Progress Notes (Signed)
Patient refusing Lantus dose tonight.

## 2013-07-10 NOTE — Progress Notes (Signed)
Subjective/Complaints: Had some nausea after she ate something from cafeteria yesterday. Feeling better today. Would like her novolog stopped as it causes cramping when she takes it (reports this was a problem PTA as well)  A 12 point review of systems has been performed and if not noted above is otherwise negative.  Objective: Vital Signs: Blood pressure 141/84, pulse 79, temperature 98 F (36.7 C), temperature source Oral, resp. rate 19, height 5' 6.5" (1.689 m), weight 99.202 kg (218 lb 11.2 oz), last menstrual period 05/22/2013, SpO2 95.00%. No results found. No results found for this basename: WBC, HGB, HCT, PLT,  in the last 72 hours No results found for this basename: NA, K, CL, CO, GLUCOSE, BUN, CREATININE, CALCIUM,  in the last 72 hours CBG (last 3)   Recent Labs  07/09/13 2127 07/09/13 2319 07/10/13 0729  GLUCAP 107* 112* 94    Wt Readings from Last 3 Encounters:  07/10/13 99.202 kg (218 lb 11.2 oz)  06/30/13 106.6 kg (235 lb 0.2 oz)  06/30/13 106.6 kg (235 lb 0.2 oz)    Physical Exam:  Constitutional: She is oriented to person, place, and time. She appears well-developed and well-nourished. obese  HENT:  Head: Normocephalic and atraumatic.  Right Ear: External ear normal.  Left Ear: External ear normal.  Eyes: Conjunctivae and EOM are normal. Pupils are equal, round, and reactive to light.  Neck: Normal range of motion. Neck supple. No JVD present. No tracheal deviation present. No thyromegaly present.  Cardiovascular: Normal rate and regular rhythm.  No murmur heard.  Pulmonary/Chest: No respiratory distress. She has no wheezes. She has no rales.  Decreased breath sounds at the bases but clear to auscultation.  Abdominal: Soft. Bowel sounds are normal. She exhibits no distension.  Musculoskeletal:  Left leg less tender. Well formed..  Lymphadenopathy:  She has no cervical adenopathy.  Neurological: She is alert and oriented to person, place, and time. No  cranial nerve deficit. Coordination normal.  Patient follows commands. UE strenght 5/5 bilaterally. RLE is 4/5 HF,KE to 5/5 at ankle. LLE HF  2+. Skin:  Left BKA site is clean and intact, dry. Multiple abrasions, old poc-like scars/lesions on limbs, chest Psychiatric: She has a normal mood and affect. Her behavior is normal. Thought content normal   Assessment/Plan: 1. Functional deficits secondary to left BKA which require 3+ hours per day of interdisciplinary therapy in a comprehensive inpatient rehab setting. Physiatrist is providing close team supervision and 24 hour management of active medical problems listed below. Physiatrist and rehab team continue to assess barriers to discharge/monitor patient progress toward functional and medical goals.  Wound looks great. Still another 10-14 days away from removing some of staples.   FIM: FIM - Bathing Bathing Steps Patient Completed: Chest;Right Arm;Right upper leg;Left upper leg;Right lower leg (including foot);Left Arm;Abdomen;Front perineal area;Buttocks Bathing: 5: Supervision: Safety issues/verbal cues  FIM - Upper Body Dressing/Undressing Upper body dressing/undressing steps patient completed:  (gown) Upper body dressing/undressing: 0: Wears gown/pajamas-no public clothing FIM - Lower Body Dressing/Undressing Lower body dressing/undressing steps patient completed:  (gown) Lower body dressing/undressing: 0: Wears gown/pajamas-no public clothing  FIM - Toileting Toileting steps completed by patient: Adjust clothing prior to toileting;Performs perineal hygiene;Adjust clothing after toileting Toileting: 0: Activity did not occur  FIM - Radio producer Devices: Bedside commode Toilet Transfers: 0-Activity did not occur  FIM - Control and instrumentation engineer Devices: Bed rails;Arm rests Bed/Chair Transfer: 0: Activity did not occur  FIM - Locomotion: Wheelchair  Distance:  31' Locomotion: Wheelchair: 5: Travels 150 ft or more: maneuvers on rugs and over door sills with supervision, cueing or coaxing FIM - Locomotion: Ambulation Locomotion: Ambulation Assistive Devices: Administrator Ambulation/Gait Assistance: 4: Min assist Locomotion: Ambulation: 1: Travels less than 50 ft with minimal assistance (Pt.>75%)  Comprehension Comprehension Mode: Auditory Comprehension: 7-Follows complex conversation/direction: With no assist  Expression Expression Mode: Verbal Expression: 7-Expresses complex ideas: With no assist  Social Interaction Social Interaction: 7-Interacts appropriately with others - No medications needed.  Problem Solving Problem Solving: 7-Solves complex problems: Recognizes & self-corrects  Memory Memory: 7-Complete Independence: No helper  Medical Problem List and Plan:  1. 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 DVT Prophylaxis/Anticoagulation: Subcutaneous heparin.  3. Pain Management: Oxycodone as needed.  keep stump wrapped and protected  4. Neuropsych: This patient is capable of making decisions on her own behalf.  5. Chronic anemia.  hemoglobin 8.4.  . Latest hemoglobin prior to admission 8.6  6. Acute CHF/bronchitis. Lasix 80 mg twice daily. No complaints of shortness of breath.   Strict I and O.'s. Weight 100kg  7. Diabetes mellitus with peripheral neuropathy. Hemoglobin A1c 6.6.   Patient on Lantus insulin 5 units each bedtime prior to admission. Sugars under control  -dc novolog insulin as pt states it causes "cramping" when she takes it---dc SSI as sugars are under pretty good control.  Will make cbg higher than 150 a call in order from md or pa. 8. Hypertension: Coreg 12.5 mg twice a day, Bidil 0.5 mg twice a day. Increase coreg to 25mg  9. Chronic renal insufficiency. CR 2.47. Strict I and O.'s.---encouraging fluids 10. GERD. Protonix 11. Derm--small papular/poc-like lesions---?insect bites  -trying to contact  derm  -sarna lotion  LOS (Days) 10 A FACE TO FACE EVALUATION WAS PERFORMED  SWARTZ,ZACHARY T 07/10/2013 7:52 AM

## 2013-07-11 ENCOUNTER — Inpatient Hospital Stay (HOSPITAL_COMMUNITY): Payer: Medicaid Other | Admitting: Occupational Therapy

## 2013-07-11 ENCOUNTER — Inpatient Hospital Stay (HOSPITAL_COMMUNITY): Payer: Medicaid Other

## 2013-07-11 ENCOUNTER — Inpatient Hospital Stay (HOSPITAL_COMMUNITY): Payer: Medicaid Other | Admitting: Physical Therapy

## 2013-07-11 ENCOUNTER — Inpatient Hospital Stay (HOSPITAL_COMMUNITY): Payer: Medicaid Other | Admitting: *Deleted

## 2013-07-11 LAB — GLUCOSE, CAPILLARY
Glucose-Capillary: 144 mg/dL — ABNORMAL HIGH (ref 70–99)
Glucose-Capillary: 167 mg/dL — ABNORMAL HIGH (ref 70–99)

## 2013-07-11 MED ORDER — HEPARIN SODIUM (PORCINE) 5000 UNIT/ML IJ SOLN
5000.0000 [IU] | Freq: Two times a day (BID) | INTRAMUSCULAR | Status: DC
  Filled 2013-07-11 (×6): qty 1

## 2013-07-11 NOTE — Progress Notes (Signed)
Physical Therapy Session Note  Patient Details  Name: Karen [name] MRN: [id number] Date of Birth: [birth year]  Today's Date: 07/11/2013 Time: 1530-1600 Time Calculation (min): 30 min  Short Term Goals: Week 2:  PT Short Term Goal 1 (Week 2): = long term goals  Skilled Therapeutic Interventions/Progress Updates:    Patient received sitting in wheelchair. Session focused on dynamic standing balance, wheelchair mobility in controlled environment and negotiation of ramp in wheelchair; see details below. Patient returned to room and left seated in wheelchair with all needs within reach.  Therapy Documentation Precautions:  Precautions Precautions: Fall Precaution Comments: avoid prolonged knee or hip flexion surgical side, KI in place this eval Required Braces or Orthoses: Knee Immobilizer - Right Restrictions Weight Bearing Restrictions: Yes LLE Weight Bearing: Non weight bearing Pain: Pain Assessment Pain Assessment: No/denies pain Pain Score: 0-No pain Locomotion : Ambulation Ambulation/Gait Assistance: Not tested (comment) Stairs / Additional Locomotion Ramp: 4: Min assist;5: Supervision (in w/c; minA ascending backwards, S descending forwards) Architect: Yes Wheelchair Assistance: 5: Investment banker, operational Details: Verbal cues for precautions/safety;Verbal cues for Astronomer: Both upper extremities Distance: 80'x2   Balance: Dynamic Standing Balance Dynamic Standing - Balance Support: Left upper extremity supported;During functional activity;No upper extremity supported Dynamic Standing - Level of Assistance: 5: Stand by assistance;4: Min assist Dynamic Standing - Balance Activities: Lateral lean/weight shifting;Forward lean/weight shifting;Reaching for objects Dynamic Standing - Comments: Patient spent 4' standing at kitchen counter in ADL apartment, sorting Jello boxes and placing them in high kitchen  cabinet. Close supervision required, intermittent minA for posterior sway. Patient at times able to maintain balance without B UE support for 3-5 seconds at a time.  See FIM for current functional status  Therapy/Group: Individual Therapy  Lillia Abed. Bernardo Brayman, PT, DPT 07/11/2013, 4:13 PM

## 2013-07-11 NOTE — Progress Notes (Signed)
Patient refusing 0600 dose Heparin.

## 2013-07-11 NOTE — Progress Notes (Signed)
Occupational Therapy Session Note  Patient Details  Name: Karen [name] MRN: [id number] Date of Birth: 1976/07/29  Today's Date: 07/11/2013 Time: 1420-1500 Time Calculation (min): 40 min  Short Term Goals: Week 2:  OT Short Term Goal 1 (Week 2): STG=LTG secondary to ELOS  Skilled Therapeutic Interventions/Progress Updates:    Pt seen for 1:1 OT with focus on activity tolerance and functional mobility with RW.  Engaged in standing task at table with reaching into cabinet with focus on standing balance and activity tolerance.  Pt required min assist initially with sit > stand and close supervision when in standing.  Engaged in obstacle course with focus on sidestepping to simulate accessing home bathroom and narrow pathways.  Pt ambulated 17 feet with 10 feet forward and 5 feet sidestepping. Pt fatigued post ambulation and propelled w/c back to room.  Therapy Documentation Precautions:  Precautions Precautions: Fall Precaution Comments: avoid prolonged knee or hip flexion surgical side, KI in place this eval Required Braces or Orthoses: Knee Immobilizer - Right Restrictions Weight Bearing Restrictions: Yes LLE Weight Bearing: Non weight bearing General:   Vital Signs: Therapy Vitals Temp: 97.4 F (36.3 C) Temp src: Oral Pulse Rate: 80 Resp: 19 BP: 139/90 mmHg Patient Position, if appropriate: Sitting Oxygen Therapy SpO2: 98 % O2 Device: None (Room air) Pain: Pain Assessment Pain Assessment: No/denies pain Pain Score: 0-No pain  See FIM for current functional status  Therapy/Group: Individual Therapy  Simonne Come 07/11/2013, 4:14 PM

## 2013-07-11 NOTE — Progress Notes (Signed)
Occupational Therapy Session Note  Patient Details  Name: Karen [name] MRN: [id number] Date of Birth: Feb 17, 1976  Today's Date: 07/11/2013 Time: 0800-0858 Time Calculation (min): 58 min  Short Term Goals: Week 2:  OT Short Term Goal 1 (Week 2): STG=LTG secondary to ELOS  Skilled Therapeutic Interventions/Progress Updates:    Pt engaged in bathing at tub/shower level and dressing with sit<>stand from tub bench.  Pt completed all tasks including transfers with supervision.  Pt still not sure if her w/c will fit into bathroom at home.  Focus on activity tolerance, w/c mobility, tub bench transfers, safety awareness, and standing balance.  Therapy Documentation Precautions:  Precautions Precautions: Fall Precaution Comments: avoid prolonged knee or hip flexion surgical side, KI in place this eval Required Braces or Orthoses: Knee Immobilizer - Right Restrictions Weight Bearing Restrictions: Yes LLE Weight Bearing: Non weight bearing Pain: Pain Assessment Pain Assessment: No/denies pain  See FIM for current functional status  Therapy/Group: Individual Therapy  Leroy Libman 07/11/2013, 9:03 AM

## 2013-07-11 NOTE — Progress Notes (Signed)
Subjective/Complaints: Refusing insulin, heparin. No specific complaints to me this am A 12 point review of systems has been performed and if not noted above is otherwise negative.  Objective: Vital Signs: Blood pressure 142/88, pulse 80, temperature 98.2 F (36.8 C), temperature source Oral, resp. rate 18, height 5' 6.5" (1.689 m), weight 102 kg (224 lb 13.9 oz), last menstrual period 05/22/2013, SpO2 92.00%. No results found. No results found for this basename: WBC, HGB, HCT, PLT,  in the last 72 hours No results found for this basename: NA, K, CL, CO, GLUCOSE, BUN, CREATININE, CALCIUM,  in the last 72 hours CBG (last 3)   Recent Labs  07/10/13 1152 07/10/13 1619 07/10/13 2053  GLUCAP 131* 131* 139*    Wt Readings from Last 3 Encounters:  07/11/13 102 kg (224 lb 13.9 oz)  06/30/13 106.6 kg (235 lb 0.2 oz)  06/30/13 106.6 kg (235 lb 0.2 oz)    Physical Exam:  Constitutional: She is oriented to person, place, and time. She appears well-developed and well-nourished. obese  HENT:  Head: Normocephalic and atraumatic.  Right Ear: External ear normal.  Left Ear: External ear normal.  Eyes: Conjunctivae and EOM are normal. Pupils are equal, round, and reactive to light.  Neck: Normal range of motion. Neck supple. No JVD present. No tracheal deviation present. No thyromegaly present.  Cardiovascular: Normal rate and regular rhythm.  No murmur heard.  Pulmonary/Chest: No respiratory distress. She has no wheezes. She has no rales.  Decreased breath sounds at the bases but clear to auscultation.  Abdominal: Soft. Bowel sounds are normal. She exhibits no distension.  Musculoskeletal:  Left leg less tender. Well formed..  Lymphadenopathy:  She has no cervical adenopathy.  Neurological: She is alert and oriented to person, place, and time. No cranial nerve deficit. Coordination normal.  Patient follows commands. UE strenght 5/5 bilaterally. RLE is 4/5 HF,KE to 5/5 at ankle. LLE HF   2+. Skin:  Left BKA site is clean and intact, dry. Multiple abrasions, old poc-like scars/lesions on limbs, chest Psychiatric: She has a normal mood and affect. Her behavior is normal. Thought content normal   Assessment/Plan: 1. Functional deficits secondary to left BKA which require 3+ hours per day of interdisciplinary therapy in a comprehensive inpatient rehab setting. Physiatrist is providing close team supervision and 24 hour management of active medical problems listed below. Physiatrist and rehab team continue to assess barriers to discharge/monitor patient progress toward functional and medical goals.     FIM: FIM - Bathing Bathing Steps Patient Completed: Chest;Right Arm;Right upper leg;Left upper leg;Right lower leg (including foot);Left Arm;Abdomen;Front perineal area;Buttocks Bathing: 5: Supervision: Safety issues/verbal cues  FIM - Upper Body Dressing/Undressing Upper body dressing/undressing steps patient completed: Thread/unthread right sleeve of pullover shirt/dresss;Put head through opening of pull over shirt/dress;Thread/unthread left sleeve of pullover shirt/dress;Pull shirt over trunk Upper body dressing/undressing: 5: Set-up assist to: Obtain clothing/put away FIM - Lower Body Dressing/Undressing Lower body dressing/undressing steps patient completed: Thread/unthread right pants leg;Thread/unthread left pants leg;Pull pants up/down;Don/Doff left sock;Don/Doff right sock Lower body dressing/undressing: 0: Wears gown/pajamas-no public clothing  FIM - Toileting Toileting steps completed by patient: Adjust clothing prior to toileting;Adjust clothing after toileting;Performs perineal hygiene Toileting: 5: Set-up assist to: Obtain supplies  FIM - Radio producer Devices: Recruitment consultant Transfers: 0-Activity did not occur  FIM - Control and instrumentation engineer Devices: Bed rails Bed/Chair Transfer: 5: Bed > Chair or  W/C: Supervision (verbal cues/safety issues);5: Chair or W/C >  Bed: Supervision (verbal cues/safety issues)  FIM - Locomotion: Wheelchair Distance: 80' Locomotion: Wheelchair: 5: Travels 150 ft or more: maneuvers on rugs and over door sills with supervision, cueing or coaxing FIM - Locomotion: Ambulation Locomotion: Ambulation Assistive Devices: Administrator Ambulation/Gait Assistance: 4: Min assist Locomotion: Ambulation: 1: Travels less than 50 ft with minimal assistance (Pt.>75%)  Comprehension Comprehension Mode: Auditory Comprehension: 7-Follows complex conversation/direction: With no assist  Expression Expression Mode: Verbal Expression: 7-Expresses complex ideas: With no assist  Social Interaction Social Interaction: 7-Interacts appropriately with others - No medications needed.  Problem Solving Problem Solving: 7-Solves complex problems: Recognizes & self-corrects  Memory Memory: 7-Complete Independence: No helper  Medical Problem List and Plan:  1. 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 DVT Prophylaxis/Anticoagulation: Subcutaneous heparin.  3. Pain Management: Oxycodone as needed.  keep stump wrapped and protected  4. Neuropsych: This patient is capable of making decisions on her own behalf.  5. Chronic anemia.  hemoglobin 8.4.  . Latest hemoglobin prior to admission 8.6  6. Acute CHF/bronchitis. Lasix 80 mg twice daily. No complaints of shortness of breath.   Strict I and O.'s. Weight 100kg  7. Diabetes mellitus with peripheral neuropathy. Hemoglobin A1c 6.6.   Patient on Lantus insulin 5 units each bedtime prior to admission. Sugars under control  -Humulin R for cbg 150-200, 2u  -dc lantus. 8. Hypertension: Coreg 12.5 mg twice a day, Bidil 0.5 mg twice a day. Increased coreg to 25mg  9. Chronic renal insufficiency. CR 2.47. Strict I and O.'s.---encouraging fluids 10. GERD. Protonix 11. Derm--small papular/poc-like lesions---?insect bites  -trying to  contact derm  -sarna lotion  LOS (Days) 11 A FACE TO FACE EVALUATION WAS PERFORMED  SWARTZ,ZACHARY T 07/11/2013 7:39 AM

## 2013-07-11 NOTE — Progress Notes (Addendum)
Physical Therapy Weekly Progress Note  Patient Details  Name: Karen [name] MRN: [id number] Date of Birth: 1976-09-06  Today's Date: 07/11/2013 Time: 0925-1027 Time Calculation (min): 62 min  Patient has met 3 of 3 short term goals.  Pt is currently at a min assist/supervision level working towards supervision level for ambulation and modified independence with wheelchair mobility. She is not overly motivated but does participate with therapies and is anticipated to meet her goals.   Patient continues to demonstrate the following deficits: decreased activity tolerance, need for supervision with transfers, decreased ambulatory distance, impaired standing balance and therefore will continue to benefit from skilled PT intervention to enhance overall performance with activity tolerance, balance and ability to compensate for deficits.  Patient progressing toward long term goals. Goals adjusted due to pt's current progress.  Continue plan of care.  PT Short Term Goals Week 1:  PT Short Term Goal 1 (Week 1): Patient will be able to perform bed mobility with min-assist PT Short Term Goal 1 - Progress (Week 1): Met PT Short Term Goal 2 (Week 1): Patient will be able to perform Transfers with min-assist PT Short Term Goal 2 - Progress (Week 1): Met PT Short Term Goal 3 (Week 1): Patient will be able to perform wheel chair management with min-assist and prope x 150' PT Short Term Goal 3 - Progress (Week 1): Met  Skilled Therapeutic Interventions/Progress Updates:    Wheelchair propulsion for UE strengthening and endurance x 250' with supervision. Forward ambulation with over carpeted surface x 20', 20' and side hopping 5' x 3 each direction with RW min assist intermittently for posterior loss of balance. Cues to avoid being too close to front of walker. Reaching task for balance with one UE support, watering plants, min assist/supervision. Sit <> stands with close supervision from lower mat, wheelchair.  Worked on Rt. LE foot placement as pt tends to have it placed too anteriorly making hand transition from wheelchair to walker difficult.   Discussed need for family education this week, she called her God-mother during the session and reported that God-mother would come for family ed on Thursday 10-12.   Therapy Documentation Precautions:  Precautions Precautions: Fall Precaution Comments: avoid prolonged knee or hip flexion surgical side, KI in place this eval Required Braces or Orthoses: Knee Immobilizer - Right Restrictions Weight Bearing Restrictions: Yes LLE Weight Bearing: Non weight bearing Pain: Pain Assessment Pain Assessment: No/denies painno c/o  See FIM for current functional status  Therapy/Group: Individual Therapy  [name] 07/11/2013, 12:05 PM

## 2013-07-12 ENCOUNTER — Inpatient Hospital Stay (HOSPITAL_COMMUNITY): Payer: Medicaid Other

## 2013-07-12 ENCOUNTER — Inpatient Hospital Stay (HOSPITAL_COMMUNITY): Payer: Medicaid Other | Admitting: Physical Therapy

## 2013-07-12 LAB — GLUCOSE, CAPILLARY
Glucose-Capillary: 99 mg/dL (ref 70–99)
Glucose-Capillary: 128 mg/dL — ABNORMAL HIGH (ref 70–99)

## 2013-07-12 NOTE — Patient Care Conference (Signed)
Inpatient RehabilitationTeam Conference and Plan of Care Update Date: 07/11/2013   Time: 2:05 PM    Patient Name: Karen [name]      Medical Record Number: [id number]  Date of Birth: 05-14-1976 Sex: Female         Room/Bed: 4M03C/4M03C-01 Payor Info: Payor: MEDICAID [place] / Plan: MEDICAID [place]ESS / Product Type: *No Product type* /    Admitting Diagnosis: LT BKA  Admit Date/Time:  06/30/2013  3:46 PM Admission Comments: No comment available   Primary Diagnosis:  Hx of BKA Principal Problem: Hx of BKA  Patient Active Problem List   Diagnosis Date Noted  . Hx of BKA 07/03/2013  . S/P BKA (below knee amputation) unilateral 06/30/2013  . Physical deconditioning 06/30/2013  . Foot osteomyelitis, left 06/24/2013  . Acute respiratory failure 06/22/2013  . CKD (chronic kidney disease) stage 3, GFR 30-59 ml/min due to DM 06/20/2013  . Cardiomyopathy, ischemic - EF 45-50% with inf WMA by 2D 02/05/13 02/06/2013  . Acute combined systolic and diastolic congestive heart failure 02/05/2013  . Acute renal failure 02/05/2013  . Essential hypertension 02/05/2013  . PAD (peripheral artery disease) 02/05/2013  . Type II or unspecified type diabetes mellitus with unspecified complication, uncontrolled   . Diabetic neuropathy   . Osteomyelitis of left great toe - S/P amputation 02/06/13 04/21/2011    Expected Discharge Date: Expected Discharge Date: 07/14/13  Team Members Present: Physician leading conference: Dr. Alger Simons Social Worker Present: Lennart Pall, LCSW Nurse Present: Heather Roberts, RN PT Present: Ladona Horns, PT OT Present: Roanna Epley, Bing Neighbors, OT SLP Present: Weston Anna, SLP PPS Coordinator present : Daiva Nakayama, RN, CRRN     Current Status/Progress Goal Weekly Team Focus  Medical   mood up and down. leg intact  see prior  finalize dc planning for friday   Bowel/Bladder   continent of bowel and bladder  Min assist  assist with use of tolieting     Swallow/Nutrition/ Hydration             ADL's   bathing and dressing bed level-supervision; toilet and tub transfers-supervision  bathing-supervision; dressing, toilet transfers and toileting-mod I; meal prep-mod I; tub transfers-supervision  sit<>stand, transfers, activity tolerance, safety awareness   Mobility   Supervision/min assist stand pivot transfers, gait.   Supervision transfers/gait, mod I wheelchair level  Endurance, transfers, sit <> stands, ambulation.    Communication             Safety/Cognition/ Behavioral Observations            Pain   pain at times to left BKA. oxycodone prn q6hrs or percocet prn q6rs  pain less than or equal to 3.   assess pain and medicate as needed   Skin   LBKA staples, 4x4 stump shrinker CDI- no drainage noted. Rash noted to back and arms. Kenolog cream scheduled  No new breakdown  change dressing as ordered, monitor for s/s of infection    Rehab Goals Patient on target to meet rehab goals: Yes *See Care Plan and progress notes for long and short-term goals.  Barriers to Discharge: entry into home    Possible Resolutions to Barriers:  ramp    Discharge Planning/Teaching Needs:  home with intermittent assistance from her godmother  education planned for Thursday 10-12   Team Discussion:  Good gains this week.  Approaching supervision to minimal assistance overall with mod i w/c goals on target.  No concerns  Revisions to Treatment Plan:  None   Continued Need for Acute Rehabilitation Level of Care: The patient requires daily medical management by a physician with specialized training in physical medicine and rehabilitation for the following conditions: Daily direction of a multidisciplinary physical rehabilitation program to ensure safe treatment while eliciting the highest outcome that is of practical value to the patient.: Yes Daily medical management of patient stability for increased activity during participation in an intensive  rehabilitation regime.: Yes Daily analysis of laboratory values and/or radiology reports with any subsequent need for medication adjustment of medical intervention for : Other;Post surgical problems  Karen [name] 07/12/2013, 4:11 PM

## 2013-07-12 NOTE — Progress Notes (Signed)
Physical Therapy Session Note  Patient Details  Name: Karen [name] MRN: [id number] Date of Birth: 16-Jun-1976  Today's Date: 07/12/2013 Time: 0930-1025 Time Calculation (min): 55 min  Short Term Goals: Week 2:  PT Short Term Goal 1 (Week 2): = long term goals  Skilled Therapeutic Interventions/Progress Updates:    Pt sleepy today, reports from the pain medication she took last night.  -Car transfer with RW and close supervision, cues for sequencing and UE placement for safety and ease of transfer. -Wheelchair propulsion over controlled environment (for strengthening and conditioning), home, and tight space negotiation in gift shop all with modified independence and increased time.  -Ambulation in gift shop stopping periodically to reach and look at items on shelves no loss of balance, overall supervision for 15', 20' with RW.   Pt self-limiting at times and requires lots of encouragement. Increased time for all tasks.    Therapy Documentation Precautions:  Precautions Precautions: Fall Precaution Comments: avoid prolonged knee or hip flexion surgical side, KI in place this eval Required Braces or Orthoses: Knee Immobilizer - Right Restrictions Weight Bearing Restrictions: Yes LLE Weight Bearing: Non weight bearing Pain: Pain Assessment Pain Assessment: No/denies pain  See FIM for current functional status  Therapy/Group: Individual Therapy  [name] 07/12/2013, 12:04 PM

## 2013-07-12 NOTE — Progress Notes (Signed)
Occupational Therapy Session Note  Patient Details  Name: Rolayne Schaible MRN: [id number] Date of Birth: [birth year]  Today's Date: 07/12/2013 Time: 0800-0856 Time Calculation (min): 56 min  Short Term Goals: Week 2:  OT Short Term Goal 1 (Week 2): STG=LTG secondary to ELOS  Skilled Therapeutic Interventions/Progress Updates:    Pt asleep upon arrival but easily aroused.  Pt opted to bathe sitting at EOB this morning stating that she didn't sleep well the night before because of increased pain in LLE.  Pt stated she didn't have any pain at this moment.  Pt completed all bathing and dressing tasks without supervision after setup.  Pt amb with RW from bed to sink to complete grooming tasks.  Focus on bed mobility, activity tolerance, dynamic sitting balance, functional amb with RW, and safety awareness.   Therapy Documentation Precautions:  Precautions Precautions: Fall Precaution Comments: avoid prolonged knee or hip flexion surgical side, KI in place this eval Required Braces or Orthoses: Knee Immobilizer - Right Restrictions Weight Bearing Restrictions: Yes LLE Weight Bearing: Non weight bearing Pain: Pain Assessment Pain Assessment: No/denies pain  See FIM for current functional status  Therapy/Group: Individual Therapy  Leroy Libman 07/12/2013, 8:56 AM

## 2013-07-12 NOTE — Progress Notes (Signed)
Social Work Patient ID: Karen [name], female   DOB: 05-08-1976, 37 y.o.   MRN: [id number]  Met with patient following team conference.  She is pleased with progress this week and confirms she has planned for her godmother to be here tomorrow morning for education.  Have confirmed with pt's friend that ramp should be in place by tomorrow end of day.  Have also communicated with agency providing pt's home PCS aide services ([facility] and they will work to increase her service hours to the full 80 hours per month (currently at 72 hours).  HH and DME on order as well.  Preparing for d/c end of week.  Cardelia Sassano, LCSW

## 2013-07-12 NOTE — Progress Notes (Signed)
Subjective/Complaints: In good spirits. Wondering when staples will come out A 12 point review of systems has been performed and if not noted above is otherwise negative.  Objective: Vital Signs: Blood pressure 122/84, pulse 82, temperature 98.3 F (36.8 C), temperature source Oral, resp. rate 19, height 5' 6.5" (1.689 m), weight 102.2 kg (225 lb 5 oz), last menstrual period 05/22/2013, SpO2 98.00%. No results found. No results found for this basename: WBC, HGB, HCT, PLT,  in the last 72 hours No results found for this basename: NA, K, CL, CO, GLUCOSE, BUN, CREATININE, CALCIUM,  in the last 72 hours CBG (last 3)   Recent Labs  07/11/13 0723 07/11/13 1114 07/11/13 1621  GLUCAP 136* 144* 167*    Wt Readings from Last 3 Encounters:  07/12/13 102.2 kg (225 lb 5 oz)  06/30/13 106.6 kg (235 lb 0.2 oz)  06/30/13 106.6 kg (235 lb 0.2 oz)    Physical Exam:  Constitutional: She is oriented to person, place, and time. She appears well-developed and well-nourished. obese  HENT:  Head: Normocephalic and atraumatic.  Right Ear: External ear normal.  Left Ear: External ear normal.  Eyes: Conjunctivae and EOM are normal. Pupils are equal, round, and reactive to light.  Neck: Normal range of motion. Neck supple. No JVD present. No tracheal deviation present. No thyromegaly present.  Cardiovascular: Normal rate and regular rhythm.  No murmur heard.  Pulmonary/Chest: No respiratory distress. She has no wheezes. She has no rales.  Decreased breath sounds at the bases but clear to auscultation.  Abdominal: Soft. Bowel sounds are normal. She exhibits no distension.  Musculoskeletal:  Left leg less tender. Well formed..  Lymphadenopathy:  She has no cervical adenopathy.  Neurological: She is alert and oriented to person, place, and time. No cranial nerve deficit. Coordination normal.  Patient follows commands. UE strenght 5/5 bilaterally. RLE is 4/5 HF,KE to 5/5 at ankle. LLE HF  2+. Skin:   Left BKA site is clean and intact, dry. Multiple abrasions, old poc-like scars/lesions on limbs, chest Psychiatric: She has a normal mood and affect. Her behavior is normal. Thought content normal   Assessment/Plan: 1. Functional deficits secondary to left BKA which require 3+ hours per day of interdisciplinary therapy in a comprehensive inpatient rehab setting. Physiatrist is providing close team supervision and 24 hour management of active medical problems listed below. Physiatrist and rehab team continue to assess barriers to discharge/monitor patient progress toward functional and medical goals.     FIM: FIM - Bathing Bathing Steps Patient Completed: Chest;Right Arm;Left Arm;Abdomen;Front perineal area;Buttocks;Left upper leg;Right lower leg (including foot) Bathing: 5: Supervision: Safety issues/verbal cues  FIM - Upper Body Dressing/Undressing Upper body dressing/undressing steps patient completed: Thread/unthread right sleeve of pullover shirt/dresss;Put head through opening of pull over shirt/dress;Thread/unthread left sleeve of pullover shirt/dress;Pull shirt over trunk Upper body dressing/undressing: 7: Complete Independence: No helper FIM - Lower Body Dressing/Undressing Lower body dressing/undressing steps patient completed: Thread/unthread right pants leg;Thread/unthread left pants leg;Pull pants up/down;Don/Doff left sock;Don/Doff right sock Lower body dressing/undressing: 5: Supervision: Safety issues/verbal cues  FIM - Toileting Toileting steps completed by patient: Performs perineal hygiene;Adjust clothing prior to toileting;Adjust clothing after toileting Toileting Assistive Devices: Grab bar or rail for support Toileting: 4: Steadying assist  FIM - Radio producer Devices: Recruitment consultant Transfers: 0-Activity did not occur  FIM - Control and instrumentation engineer Devices: Adult nurse Transfer: 5: Bed > Chair  or W/C: Supervision (verbal cues/safety issues);5: Chair or W/C >  Bed: Supervision (verbal cues/safety issues)  FIM - Locomotion: Wheelchair Distance: 80'x2 Locomotion: Wheelchair: 2: Travels 50 - 149 ft with supervision, cueing or coaxing FIM - Locomotion: Ambulation Locomotion: Ambulation Assistive Devices: Administrator Ambulation/Gait Assistance: Not tested (comment) Locomotion: Ambulation: 0: Activity did not occur  Comprehension Comprehension Mode: Auditory Comprehension: 7-Follows complex conversation/direction: With no assist  Expression Expression Mode: Verbal Expression: 7-Expresses complex ideas: With no assist  Social Interaction Social Interaction: 7-Interacts appropriately with others - No medications needed.  Problem Solving Problem Solving: 7-Solves complex problems: Recognizes & self-corrects  Memory Memory: 7-Complete Independence: No helper  Medical Problem List and Plan:  1. 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 DVT Prophylaxis/Anticoagulation: Subcutaneous heparin.  3. Pain Management: Oxycodone as needed.  keep stump wrapped and protected  4. Neuropsych: This patient is capable of making decisions on her own behalf.  5. Chronic anemia.  hemoglobin 8.4.  . Latest hemoglobin prior to admission 8.6  6. Acute CHF/bronchitis. Lasix 80 mg twice daily. No complaints of shortness of breath.   Strict I and O.'s. Weight 100kg  7. Diabetes mellitus with peripheral neuropathy. Hemoglobin A1c 6.6.   Patient on Lantus insulin 5 units each bedtime prior to admission. Sugars under control  -Humulin R for cbg 150-200, 2u  -dc'ed lantus as she's refusing 8. Hypertension: Coreg 12.5 mg twice a day, Bidil 0.5 mg twice a day. Increased coreg to 25mg  9. Chronic renal insufficiency. CR 2.47. Strict I and O.'s.---encouraging fluids 10. GERD. Protonix 11. Derm--small papular/poc-like lesions---?insect bites  -follow up with derm  LOS (Days) 12 A FACE TO FACE  EVALUATION WAS PERFORMED  SWARTZ,ZACHARY T 07/12/2013 7:15 AM

## 2013-07-12 NOTE — Progress Notes (Signed)
Occupational Therapy Session Note  Patient Details  Name: Karen [name] MRN: [id number] Date of Birth: March 05, 1976  Today's Date: 07/12/2013 Time: 1400-1440 Time Calculation (min): 40 min  Short Term Goals: Week 2:  OT Short Term Goal 1 (Week 2): STG=LTG secondary to ELOS  Skilled Therapeutic Interventions/Progress Updates:    Therapy session focused on functional transfers, sit<>stand, UE strengthening and activity tolerance. Pt received sitting in w/c reporting being very fatigued from pain meds she had received however willing to participate in therapy. Pt propelled self in w/c from room to ADL apartment. Ambulated from outside of bathroom with min assist for sit>stand then supervision to ambulate to TTB. Completed tub transfer with supervision and ambulated back to w/c at supervision. Pt requiring frequent rest breaks secondary to nausea and fatigue from medication. Pt engaged in w/c push-ups with emphasis on clearance of buttocks from chair in prep for sit>stand and for UB strengthening. Pt completed 3 push ups then began vomiting. Pt returned to nurses station as room was being cleaned at this time and RN aware. Pt left with nursing tech.   Therapy Documentation Precautions:  Precautions Precautions: Fall Precaution Comments: avoid prolonged knee or hip flexion surgical side, KI in place this eval Required Braces or Orthoses: Knee Immobilizer - Right Restrictions Weight Bearing Restrictions: Yes LLE Weight Bearing: Non weight bearing General: General Amount of Missed OT Time (min): 5 Minutes Vital Signs: Therapy Vitals Temp: 97.6 F (36.4 C) Temp src: Oral Pulse Rate: 77 Resp: 20 BP: 153/93 mmHg Patient Position, if appropriate: Sitting Oxygen Therapy SpO2: 97 % O2 Device: None (Room air) Pain: No c/o pain during therapy session.   Other Treatments:    See FIM for current functional status  Therapy/Group: Individual Therapy  Duayne Cal 07/12/2013, 2:50  PM

## 2013-07-12 NOTE — Progress Notes (Signed)
Physical Therapy Note  Patient Details  Name: Radiah Donn MRN: [id number] Date of Birth: 01-25-1976 Today's Date: 07/12/2013  1050-1115 (25 minutes) individual Pain: no reported pain Focus of treatment: Therapeutic exercise focused on activity tolerance Treatment: Pt up in wc upon arrival; UE ergonometer Level 2 X 15 minutes with perceived exertion of 13 (somewhat hard).    Kainalu Heggs,JIM 07/12/2013, 11:02 AM

## 2013-07-13 ENCOUNTER — Inpatient Hospital Stay (HOSPITAL_COMMUNITY): Payer: Medicaid Other

## 2013-07-13 ENCOUNTER — Inpatient Hospital Stay (HOSPITAL_COMMUNITY): Payer: Medicaid Other | Admitting: Physical Therapy

## 2013-07-13 ENCOUNTER — Encounter (HOSPITAL_COMMUNITY): Payer: Medicaid Other

## 2013-07-13 ENCOUNTER — Inpatient Hospital Stay (HOSPITAL_COMMUNITY): Payer: Medicaid Other | Admitting: Rehabilitation

## 2013-07-13 MED ORDER — CAMPHOR-MENTHOL 0.5-0.5 % EX LOTN: TOPICAL_LOTION | CUTANEOUS | Status: DC | PRN

## 2013-07-13 MED ORDER — SODIUM BICARBONATE 650 MG PO TABS: 650.0000 mg | ORAL_TABLET | Freq: Every day | ORAL | Status: DC

## 2013-07-13 MED ORDER — TRIAMCINOLONE ACETONIDE 0.025 % EX CREA: 1.0000 "application " | TOPICAL_CREAM | Freq: Three times a day (TID) | CUTANEOUS | Status: DC

## 2013-07-13 MED ORDER — CALCIUM CARBONATE 1250 (500 CA) MG PO TABS: 1.0000 | ORAL_TABLET | Freq: Two times a day (BID) | ORAL | Status: DC

## 2013-07-13 MED ORDER — INSULIN REGULAR HUMAN 100 UNIT/ML IJ SOLN: 2.0000 [IU] | Freq: Three times a day (TID) | INTRAMUSCULAR | Status: DC

## 2013-07-13 MED ORDER — TORSEMIDE 100 MG PO TABS: 100.0000 mg | ORAL_TABLET | Freq: Every day | ORAL | Status: DC

## 2013-07-13 MED ORDER — CARVEDILOL 25 MG PO TABS: 25.0000 mg | ORAL_TABLET | Freq: Two times a day (BID) | ORAL | Status: DC

## 2013-07-13 MED ORDER — ISOSORB DINITRATE-HYDRALAZINE 20-37.5 MG PO TABS: 1.0000 | ORAL_TABLET | Freq: Two times a day (BID) | ORAL | Status: DC

## 2013-07-13 MED ORDER — OXYCODONE HCL 5 MG PO TABS: 5.0000 mg | ORAL_TABLET | Freq: Four times a day (QID) | ORAL | Status: DC | PRN

## 2013-07-13 MED ORDER — ESOMEPRAZOLE MAGNESIUM 40 MG PO CPDR: 40.0000 mg | DELAYED_RELEASE_CAPSULE | Freq: Every day | ORAL | Status: DC

## 2013-07-13 NOTE — Discharge Summary (Signed)
Karen [name], Karen [name] NO.:  [id number]  MEDICAL RECORD NO.:  [id number]  LOCATION:  4M03C                        FACILITY:  [facility]  PHYSICIAN:  Karen [name], M.D.DATE OF BIRTH:  1976-06-22  DATE OF ADMISSION:  06/30/2013 DATE OF DISCHARGE:07/13/2013                              DISCHARGE SUMMARY   DISCHARGE DIAGNOSES: 1. Left below-knee amputation secondary to osteomyelitis June 24, 2013. 2. Subcutaneous heparin for deep vein thrombosis prophylaxis. 3. Pain management. 4. Chronic anemia. 5. Acute chronic heart failure with bronchitis. 6. Diabetes mellitus with peripheral neuropathy. 7. Hypertension. 8. Chronic renal insufficiency with creatinine 2.47. 9. Gastroesophageal reflux disease. 10.Dermatology.  HISTORY OF PRESENT ILLNESS:  This is a 37 year old right-handed female with history of diabetes mellitus, chronic renal insufficiency, as well as amputation of the left great toe Jan 27, 2013, who was admitted June 20, 2013, with left forefoot pain and drainage of wound, as well as low-grade fever.  The patient also noted increased cough and shortness of breath.  CT of the chest showed moderate bilateral pleural effusions, acute congestive heart failure.  She was placed on intravenous Lasix therapy.  Echocardiogram with ejection fraction 45%. Grade 3 diastolic dysfunction.  Venous Doppler studies of lower extremity showed no evidence of DVT.  MRI of the left foot showed osteomyelitis distal third and fourth metatarsals.  Limb was not felt to be salvageable and underwent a left below-knee amputation June 24, 2013, per Karen [name].  Postoperative pain management and placed on subcutaneous heparin for DVT prophylaxis.  Physical and occupational therapy ongoing.  The patient was admitted for a comprehensive rehab program.  PAST MEDICAL HISTORY:  See discharge diagnoses.  SOCIAL HISTORY:  Lives with her children.  FUNCTIONAL HISTORY:  Prior  to admission independent.  FUNCTIONAL STATUS:  Upon admission to rehab services, minimal assist to scoot to the edge of the bed.  PHYSICAL EXAMINATION:  VITAL SIGNS:  Blood pressure 131/94, pulse 97, temperature 98.5, respirations 18. GENERAL:  This was an alert female, oriented x3.  Pupils round and reactive to light. LUNGS:  Did have some decreased breath sounds at the bases, but clear to auscultation. CARDIAC:  Regular rate and rhythm. ABDOMEN:  Soft, nontender.  Good bowel sounds. EXTREMITIES:  Left below-knee amputation site was dressed and appropriately tender.  REHABILITATION HOSPITAL COURSE:  The patient was admitted to inpatient rehab services with therapies initiated on a 3-hour daily basis consisting of physical therapy, occupational therapy, and 24-hour rehabilitation nursing.  The following issues were addressed during the patient's rehabilitation stay.  Pertaining to Karen [name]'s left below- knee amputation, secondary osteomyelitis, surgical site healing nicely, she would follow up in a week with Karen [name] for removal of staples as well as [facility].  She remained on subcutaneous heparin for DVT prophylaxis throughout her rehab course.  Pain management with the use of oxycodone and good results.  She did have a history of chronic anemia.  Hemoglobin varied 8.4 to 8.6, and monitored.  History of diabetes mellitus, peripheral neuropathy, hemoglobin A1c.  She remained on insulin therapy as advised and stressed the need to maintain diabetic diet of which she had  received family diabetic teaching for. Acute CHF with bronchitis.  She exhibited no other signs of fluid overload.  She was currently on Demadex 100 mg daily.  She remained afebrile.  Blood pressure is controlled on Coreg which had been increased to 25 mg twice daily as well as continue on BiDil.  The patient did have a small papular-POC like lesions, question insect bites.  She was to follow up  with Dermatology.  She continued on Kenalog cream 3 times daily as advised.  The patient received weekly collaborative interdisciplinary team conferences to discuss estimated length of stay, family teaching, and any barriers to discharge.  She was able to navigate her wheelchair in a controlled environment as well as negotiation of ramp while in her wheelchair.  Dressing at bed level with minimal assistance, bathing at bed level min assist, toileting transfers minimal assist, needing minimal assist for scoot transfers from an elevated surface.  Full family teaching was completed.  Her strength and endurance did improve nicely during her rehab stay and plan was to be discharged to home.  DISCHARGE MEDICATIONS:  At time of dictation included; 1. Os-Cal 1 tablet b.i.d. 2. Sarna lotion as needed. 3. Coreg 25 mg p.o. b.i.d. 4. Benadryl 25 mg every 6 hours as needed for itching. 5. Colace 100 mg p.o. b.i.d. 6. Insulin regular 2-10 units subcutaneously t.i.d. before meals. 7. BiDil 20-37.5 mg 1 p.o. b.i.d. 8. Oxycodone 5 mg p.o. every 6 hours as needed for moderate pain. 9. Protonix 40 mg p.o. daily. 10.Sodium bicarbonate 650 mg p.o. daily. 11.Demadex 100 mg p.o. daily. 12.Kenalog cream applied t.i.d. to back.  DIET:  Diabetic diet.  SPECIAL INSTRUCTIONS:  The patient would follow up with Karen [name] at the outpatient rehab service office July 31, 2013.  Karen [name] in 1 week for removal of staples.  Karen [name] July 27, 2013, medical management.  The patient was advised to keep amputation site dry, to call for any increased reddish, drainage or fever.     Karen [name], P.A.   ______________________________ Karen [name], M.D.    DA/MEDQ  D:  07/13/2013  T:  07/13/2013  Job:  [id number]  cc:   Karen [name], M.D. Wylene Simmer, MD [name], MD

## 2013-07-13 NOTE — Discharge Summary (Signed)
Discharge summary job # [id number]

## 2013-07-13 NOTE — Progress Notes (Signed)
Patient refusing 2000 dose of heparin. RN educated patient on blood clot and stroke risk. Patient also refused senna and colace, but had an extra large bowel movement this evening.

## 2013-07-13 NOTE — Progress Notes (Signed)
Occupational Therapy Session Note  Patient Details  Name: Karen [name] MRN: [id number] Date of Birth: [birth year]  Today's Date: 07/13/2013  Session 1 Time: 0830-0900 Time Calculation (min): 30 min  Short Term Goals: Week 2:  OT Short Term Goal 1 (Week 2): STG=LTG secondary to ELOS  Skilled Therapeutic Interventions/Progress Updates:    Pt initially stated she wanted to sleep in but agreed after 30 mins to engage in bathing at tub/shower level and get dressed seated EOB.  Pt performed tub bench transfers with supervision and toilet transfers at mod I level.  Focus on bed mobility, dynamic standing balance, activity tolerance, transfers, and safety awareness.  Pt pleased with progress and stated she is happy to be going home.  Therapy Documentation Precautions:  Precautions Precautions: Fall Precaution Comments: avoid prolonged knee or hip flexion surgical side, KI in place this eval Required Braces or Orthoses: Knee Immobilizer - Right Restrictions Weight Bearing Restrictions: Yes LLE Weight Bearing: Non weight bearing General: General Amount of Missed OT Time (min): 30 Minutes Pain: Pain Assessment Pain Assessment: No/denies pain  See FIM for current functional status  Therapy/Group: Individual Therapy  Session 2 Pt missed 45 mins skilled OT services.  Pt awaiting delivery of DME before discharging home.  Pt stated she was going to leave as soon as it arrived and didn't want to do anymore therapy.  Leotis Shames [facility] 07/13/2013, 10:00 AM

## 2013-07-13 NOTE — Progress Notes (Signed)
Subjective/Complaints: Sleeping in. No new issues. A 12 point review of systems has been performed and if not noted above is otherwise negative.  Objective: Vital Signs: Blood pressure 130/80, pulse 80, temperature 98.1 F (36.7 C), temperature source Oral, resp. rate 19, height 5' 6.5" (1.689 m), weight 103 kg (227 lb 1.2 oz), last menstrual period 05/22/2013, SpO2 98.00%. No results found. No results found for this basename: WBC, HGB, HCT, PLT,  in the last 72 hours No results found for this basename: NA, K, CL, CO, GLUCOSE, BUN, CREATININE, CALCIUM,  in the last 72 hours CBG (last 3)   Recent Labs  07/12/13 1153 07/12/13 1656 07/13/13 0654  GLUCAP 128* 169* 113*    Wt Readings from Last 3 Encounters:  07/13/13 103 kg (227 lb 1.2 oz)  06/30/13 106.6 kg (235 lb 0.2 oz)  06/30/13 106.6 kg (235 lb 0.2 oz)    Physical Exam:  Constitutional: She is oriented to person, place, and time. She appears well-developed and well-nourished. obese  HENT:  Head: Normocephalic and atraumatic.  Right Ear: External ear normal.  Left Ear: External ear normal.  Eyes: Conjunctivae and EOM are normal. Pupils are equal, round, and reactive to light.  Neck: Normal range of motion. Neck supple. No JVD present. No tracheal deviation present. No thyromegaly present.  Cardiovascular: Normal rate and regular rhythm.  No murmur heard.  Pulmonary/Chest: No respiratory distress. She has no wheezes. She has no rales.  Decreased breath sounds at the bases but clear to auscultation.  Abdominal: Soft. Bowel sounds are normal. She exhibits no distension.  Musculoskeletal:  Left leg less tender. Well formed..  Lymphadenopathy:  She has no cervical adenopathy.  Neurological: She is alert and oriented to person, place, and time. No cranial nerve deficit. Coordination normal.  Patient follows commands. UE strenght 5/5 bilaterally. RLE is 4/5 HF,KE to 5/5 at ankle. LLE HF  2+. Skin:  Left BKA site is clean and  intact, dry. Multiple abrasions, old poc-like scars/lesions on limbs, chest Psychiatric: She has a normal mood and affect. Her behavior is normal. Thought content normal   Assessment/Plan: 1. Functional deficits secondary to left BKA which require 3+ hours per day of interdisciplinary therapy in a comprehensive inpatient rehab setting. Physiatrist is providing close team supervision and 24 hour management of active medical problems listed below. Physiatrist and rehab team continue to assess barriers to discharge/monitor patient progress toward functional and medical goals.     FIM: FIM - Bathing Bathing Steps Patient Completed: Chest;Right Arm;Left Arm;Abdomen;Front perineal area;Buttocks;Left upper leg;Right lower leg (including foot);Right upper leg Bathing: 5: Supervision: Safety issues/verbal cues  FIM - Upper Body Dressing/Undressing Upper body dressing/undressing steps patient completed: Thread/unthread right sleeve of pullover shirt/dresss;Put head through opening of pull over shirt/dress;Thread/unthread left sleeve of pullover shirt/dress;Pull shirt over trunk Upper body dressing/undressing: 7: Complete Independence: No helper FIM - Lower Body Dressing/Undressing Lower body dressing/undressing steps patient completed: Thread/unthread right pants leg;Thread/unthread left pants leg;Pull pants up/down;Don/Doff left sock;Don/Doff right sock Lower body dressing/undressing: 5: Set-up assist to: Obtain clothing  FIM - Toileting Toileting steps completed by patient: Performs perineal hygiene;Adjust clothing prior to toileting;Adjust clothing after toileting Toileting Assistive Devices: Grab bar or rail for support Toileting: 4: Steadying assist  FIM - Radio producer Devices: Bedside commode Toilet Transfers: 4-To toilet/BSC: Min A (steadying Pt. > 75%)  FIM - Control and instrumentation engineer Devices: Walker;Bed rails Bed/Chair Transfer: 6:  Supine > Sit: No assist;5: Bed > Chair or  W/C: Supervision (verbal cues/safety issues)  FIM - Locomotion: Wheelchair Distance: 80'x2 Locomotion: Wheelchair: 6: Travels 150 ft or more, turns around, maneuvers to table, bed or toilet, negotiates 3% grade: maneuvers on rugs and over door sills independently FIM - Locomotion: Ambulation Locomotion: Ambulation Assistive Devices: Administrator Ambulation/Gait Assistance: 5: Supervision Locomotion: Ambulation: 1: Travels less than 50 ft with supervision/safety issues  Comprehension Comprehension Mode: Auditory Comprehension: 6-Follows complex conversation/direction: With extra time/assistive device  Expression Expression Mode: Verbal Expression: 7-Expresses complex ideas: With no assist  Social Interaction Social Interaction: 6-Interacts appropriately with others with medication or extra time (anti-anxiety, antidepressant).  Problem Solving Problem Solving: 7-Solves complex problems: Recognizes & self-corrects  Memory Memory: 7-Complete Independence: No helper  Medical Problem List and Plan:  1. 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 DVT Prophylaxis/Anticoagulation: Subcutaneous heparin.  3. Pain Management: Oxycodone as needed.  keep stump wrapped and protected  4. Neuropsych: This patient is capable of making decisions on her own behalf.  5. Chronic anemia.  hemoglobin 8.4.  . Latest hemoglobin prior to admission 8.6  6. Acute CHF/bronchitis. Lasix 80 mg twice daily. No complaints of shortness of breath.   Strict I and O.'s. Weight 100kg  7. Diabetes mellitus with peripheral neuropathy. Hemoglobin A1c 6.6.   Patient on Lantus insulin 5 units each bedtime prior to admission. Sugars under control  -Humulin R for cbg 150-200, 2u  -dc'ed lantus as she's refusing 8. Hypertension: Coreg 12.5 mg twice a day, Bidil 0.5 mg twice a day. Increased coreg to 25mg  9. Chronic renal insufficiency. CR 2.47. Strict I and  O.'s.---encouraging fluids 10. GERD. Protonix 11. Derm--small papular/poc-like lesions---?insect bites  -follow up with derm  LOS (Days) 13 A FACE TO FACE EVALUATION WAS PERFORMED  SWARTZ,ZACHARY T 07/13/2013 7:39 AM

## 2013-07-13 NOTE — Progress Notes (Signed)
Occupational Therapy Discharge Summary  Patient Details  Name: Karen [name] MRN: [id number] Date of Birth: 05-Jul-1976  Today's Date: 07/13/2013  Patient has met 9 of 9 long term goals due to improved activity tolerance, improved balance and ability to compensate for deficits.  Pt made steady progress with bathing, dressing, toilet transfers and toileting, and tub bench transfers during this admission.  Pt is anble to complete simple meal prep at w/c level and RW level with supervision. Pt's god mother has been present for therapy and provides the appropriate level of supervision.  Patient to discharge at overall Modified Independent level with self-care tasks and supervision with standing tasks.  Patient's care partner is independent to provide the necessary physical assistance at discharge.     Recommendation:  Patient will not require follow up OT at this time.  Equipment: Drop arm BSC, tub transfer bench  Reasons for discharge: treatment goals met and discharge from hospital  Patient/family agrees with progress made and goals achieved: Yes  OT Discharge ADL ADL Eating: Independent Grooming: Independent Upper Body Bathing: Modified independent Lower Body Bathing: Supervision/safety Upper Body Dressing: Independent Lower Body Dressing: Supervision/safety Toileting: Modified independent Toilet Transfer: Modified independent Toilet Transfer Equipment: Drop arm bedside commode Tub/Shower Transfer: Close supervison Tub/Shower Equipment: Radio broadcast assistant Vision/Perception  Vision - History Baseline Vision: Wears glasses all the time Patient Visual Report: No change from baseline Vision - Assessment Vision Assessment: Vision not tested Perception Perception: Within Functional Limits Praxis Praxis: Intact  Cognition Overall Cognitive Status: Within Functional Limits for tasks assessed Arousal/Alertness: Awake/alert Orientation Level: Oriented X4 Memory: Appears  intact Awareness: Appears intact Problem Solving: Appears intact Sensation Sensation Light Touch: Appears Intact Stereognosis: Appears Intact Hot/Cold: Appears Intact Proprioception: Appears Intact Coordination Gross Motor Movements are Fluid and Coordinated: Yes Fine Motor Movements are Fluid and Coordinated: Yes Motor  Motor Motor: Within Functional Limits Trunk/Postural Assessment  Cervical Assessment Cervical Assessment: Within Functional Limits Thoracic Assessment Thoracic Assessment: Within Functional Limits Lumbar Assessment Lumbar Assessment: Within Functional Limits Postural Control Postural Control: Within Functional Limits  Balance Static Sitting Balance Static Sitting - Balance Support: No upper extremity supported Static Sitting - Level of Assistance: 7: Independent Dynamic Sitting Balance Dynamic Sitting - Balance Support: Bilateral upper extremity supported;Feet unsupported Dynamic Sitting - Level of Assistance: 6: Modified independent (Device/Increase time) Extremity/Trunk Assessment RUE Assessment RUE Assessment: Within Functional Limits LUE Assessment LUE Assessment: Within Functional Limits  See FIM for current functional status  Leroy Libman 07/13/2013, 6:52 AM

## 2013-07-13 NOTE — Progress Notes (Signed)
Social Work  Discharge Note  **Team aware and agreeable with plan to change d/c date from 10/24 to 10/23   The overall goal for the admission was met for:   Discharge location: Yes - home with family/ friends providing intermittent support  Length of Stay: Yes - 13 days  Discharge activity level: Yes - mod i wheelchair level  Home/community participation: Yes  Services provided included: MD, RD, PT, OT, RN, TR, Pharmacy and SW  Financial Services: Medicaid  Follow-up services arranged: Home Health: RN, PT, OT via [place], DME: 20x18 lightweight w/c with left amputee support pad, basic cushion, semi-electric hospital bed, drop arm commde and tub bench all via [place], Other: Notified [facility] - of pt's d/c and need to resume services.  Agency to increase service hours from 72 to 80 hours per month. and Patient/Family has no preference for HH/DME agencies  Comments (or additional information):  Patient/Family verbalized understanding of follow-up arrangements: Yes  Individual responsible for coordination of the follow-up plan: patient  Confirmed correct DME delivered: Yarielis Funaro 07/13/2013    [place], [place]

## 2013-07-13 NOTE — Progress Notes (Signed)
Physical Therapy Discharge Summary  Patient Details  Name: Karen [name] MRN: [id number] Date of Birth: August 01, 1976  Today's Date: 07/13/2013 Time: 1100-1150 Time Calculation (min): 50 min  Patient has met 11 of 11 long term goals due to improved balance, increased strength, increased range of motion and decreased pain.  Patient to discharge at a wheelchair level Modified Independent.   Patient's care partner is independent to provide the necessary physical assistance at discharge.  Reasons goals not met: n/a all goals met  Recommendation:  Patient will benefit from ongoing skilled PT services in home health setting to continue to advance safe functional mobility and minimize fall risk.  Equipment: Wheelchair, cushion, rolling walker, hospital bed  Reasons for discharge: treatment goals met and discharge from hospital  Patient/family agrees with progress made and goals achieved: Yes  PT Discharge Precautions/Restrictions Precautions Precautions: Fall Pain Pain Assessment Pain Assessment: No/denies pain  Mobility Bed Mobility Rolling Right: 6: Modified independent (Device/Increase time) Rolling Left: 6: Modified independent (Device/Increase time) Right Sidelying to Sit: 6: Modified independent (Device/Increase time) Supine to Sit: 6: Modified independent (Device/Increase time) Transfers Transfers: Yes Sit to Stand: 5: Supervision Stand Pivot Transfers: 5: Supervision;With armrests Locomotion  Ambulation Ambulation: Yes Ambulation/Gait Assistance: 5: Supervision Ambulation Distance (Feet): 25 Feet Assistive device: Rolling walker Stairs / Additional Locomotion Stairs: No Ramp: 5: Building services engineer Mobility: Yes Wheelchair Assistance: 6: Modified independent (Device/Increase time) Wheelchair Propulsion: Both upper extremities;Right lower extremity Wheelchair Parts Management: Independent Distance: 150   Skilled Intervention: Session focused  on grad day and family education with patient's godmother. Patient propelled wheelchair 150+ feet in controlled environment. Patient demonstrated ambulation with close supervision on level tile and carpet with RW. Patient performed basic and car transfer with supervision using RW for stand pivot. Patient propelled wheelchair up and down ramp - ascending backwards and descending forwards to use UE's and right LE. Reviewed care of residual limb, skin inspection, shaping/wrapping, need for ROM and LE strengthening. Patient and godmother report they have no further questions. See FIM for current functional status  Sanjuana Letters 07/13/2013, 3:36 PM

## 2013-07-13 NOTE — Progress Notes (Signed)
Pt. Got d/c instructions and equipment.pt. Ready to go home with family.

## 2013-07-13 NOTE — Plan of Care (Signed)
Problem: RH KNOWLEDGE DEFICIT Goal: RH STG INCREASE KNOWLEDGE OF DIABETES Patient will increase knowledge on s/s of hypo and hyperglycemia and become more aware of medications used in the treatment of diabetes.  Outcome: Not Met (add Reason) Pt. Keep refusing her insulin injections. Goal: RH STG INCREASE KNOWLEDGE OF HYPERTENSION Pt. Will increase knowledge of s/s of hypertension and medications used in the treatment process.  Outcome: Not Met (add Reason) Pt. Refused to take her blood pressure medicine,after the teaching and education by the nurse.

## 2013-07-31 ENCOUNTER — Encounter: Payer: Medicaid Other | Attending: Physical Medicine & Rehabilitation | Admitting: Physical Medicine & Rehabilitation

## 2013-07-31 ENCOUNTER — Telehealth: Payer: Self-pay

## 2013-07-31 NOTE — Telephone Encounter (Signed)
Karen [name] a physical therapist with [facility] called to let us know that patient has been discharged because medicaid was denied and patient did not want to pay out of pocket.  Patient was given information on home program.

## 2015-10-02 NOTE — Plan of Care (Addendum)
Was called by Dr. Colin Rhein, ER physician at [facility] regarding patient [name], 40 year old female presenting with shortness of breath. Patient's chest x-ray was showing congestion and patient's blood pressure was more than [id number] systolic. Patient is also anemic with hemoglobin of 7 with stool for occult blood being negative. Patient's creatinine is around to be around 5 and no old labs to compare. Patient is being admitted for acute distress secondary to CHF/hypertensive urgency with renal failure and anemia. ER physician has given labetalol IV 1 dose and if not improving then will be giving nitroglycerin infusion.  In reviewing patient's chart at [facility] patient has known history of cardiomyopathy, chronic kidney disease stage III and chronic anemia and diabetes.  Karen [name].

## 2015-10-03 ENCOUNTER — Inpatient Hospital Stay
Admission: EM | Admit: 2015-10-03 | Payer: Self-pay | Source: Other Acute Inpatient Hospital | Admitting: Internal Medicine

## 2015-10-14 ENCOUNTER — Other Ambulatory Visit: Payer: Self-pay | Admitting: *Deleted

## 2015-10-14 DIAGNOSIS — Z01810 Encounter for preprocedural cardiovascular examination: Secondary | ICD-10-CM

## 2015-10-14 DIAGNOSIS — N185 Chronic kidney disease, stage 5: Secondary | ICD-10-CM

## 2015-10-29 ENCOUNTER — Encounter: Payer: Self-pay | Admitting: Vascular Surgery

## 2015-11-05 ENCOUNTER — Encounter (HOSPITAL_COMMUNITY): Payer: Medicaid Other

## 2015-11-05 ENCOUNTER — Other Ambulatory Visit (HOSPITAL_COMMUNITY): Payer: Medicaid Other

## 2015-11-05 ENCOUNTER — Ambulatory Visit: Payer: Medicaid Other | Admitting: Vascular Surgery

## 2015-12-11 ENCOUNTER — Encounter: Payer: Self-pay | Admitting: Vascular Surgery

## 2015-12-17 ENCOUNTER — Ambulatory Visit: Payer: Medicaid Other | Admitting: Vascular Surgery

## 2015-12-17 ENCOUNTER — Other Ambulatory Visit (HOSPITAL_COMMUNITY): Payer: Medicaid Other

## 2015-12-17 ENCOUNTER — Encounter (HOSPITAL_COMMUNITY): Payer: Medicaid Other

## 2016-01-10 DIAGNOSIS — R768 Other specified abnormal immunological findings in serum: Secondary | ICD-10-CM | POA: Insufficient documentation

## 2016-01-16 DIAGNOSIS — R52 Pain, unspecified: Secondary | ICD-10-CM | POA: Insufficient documentation

## 2016-01-16 DIAGNOSIS — F17291 Nicotine dependence, other tobacco product, in remission: Secondary | ICD-10-CM | POA: Insufficient documentation

## 2016-01-16 DIAGNOSIS — Z833 Family history of diabetes mellitus: Secondary | ICD-10-CM | POA: Insufficient documentation

## 2016-01-16 DIAGNOSIS — Z89512 Acquired absence of left leg below knee: Secondary | ICD-10-CM | POA: Insufficient documentation

## 2016-01-16 DIAGNOSIS — L299 Pruritus, unspecified: Secondary | ICD-10-CM | POA: Insufficient documentation

## 2016-01-16 DIAGNOSIS — I129 Hypertensive chronic kidney disease with stage 1 through stage 4 chronic kidney disease, or unspecified chronic kidney disease: Secondary | ICD-10-CM | POA: Insufficient documentation

## 2016-01-16 DIAGNOSIS — D689 Coagulation defect, unspecified: Secondary | ICD-10-CM | POA: Insufficient documentation

## 2016-01-16 DIAGNOSIS — I509 Heart failure, unspecified: Secondary | ICD-10-CM | POA: Insufficient documentation

## 2016-01-16 DIAGNOSIS — Z7189 Other specified counseling: Secondary | ICD-10-CM | POA: Insufficient documentation

## 2016-01-20 ENCOUNTER — Ambulatory Visit (INDEPENDENT_AMBULATORY_CARE_PROVIDER_SITE_OTHER): Payer: Medicaid Other | Admitting: Surgery

## 2016-01-20 ENCOUNTER — Ambulatory Visit (INDEPENDENT_AMBULATORY_CARE_PROVIDER_SITE_OTHER)
Admission: RE | Admit: 2016-01-20 | Discharge: 2016-01-20 | Disposition: A | Discharge status: Home / Self Care | Payer: Medicaid Other | Source: Ambulatory Visit | Attending: Surgery | Admitting: Surgery

## 2016-01-20 ENCOUNTER — Encounter: Payer: Self-pay | Admitting: Surgery

## 2016-01-20 ENCOUNTER — Other Ambulatory Visit: Payer: Self-pay

## 2016-01-20 ENCOUNTER — Ambulatory Visit (HOSPITAL_COMMUNITY)
Admission: RE | Admit: 2016-01-20 | Discharge: 2016-01-20 | Disposition: A | Discharge status: Home / Self Care | Payer: Medicaid Other | Source: Ambulatory Visit | Attending: Surgery | Admitting: Surgery

## 2016-01-20 VITALS — BP 184/102 | HR 87 | Temp 97.2°F | Resp 24 | Ht 65.5 in | Wt 240.0 lb

## 2016-01-20 DIAGNOSIS — I120 Hypertensive chronic kidney disease with stage 5 chronic kidney disease or end stage renal disease: Secondary | ICD-10-CM | POA: Insufficient documentation

## 2016-01-20 DIAGNOSIS — I82611 Acute embolism and thrombosis of superficial veins of right upper extremity: Secondary | ICD-10-CM | POA: Diagnosis not present

## 2016-01-20 DIAGNOSIS — E1122 Type 2 diabetes mellitus with diabetic chronic kidney disease: Secondary | ICD-10-CM | POA: Diagnosis not present

## 2016-01-20 DIAGNOSIS — Z01810 Encounter for preprocedural cardiovascular examination: Secondary | ICD-10-CM | POA: Insufficient documentation

## 2016-01-20 DIAGNOSIS — Z992 Dependence on renal dialysis: Secondary | ICD-10-CM | POA: Diagnosis not present

## 2016-01-20 DIAGNOSIS — K219 Gastro-esophageal reflux disease without esophagitis: Secondary | ICD-10-CM | POA: Diagnosis not present

## 2016-01-20 DIAGNOSIS — N186 End stage renal disease: Secondary | ICD-10-CM

## 2016-01-20 DIAGNOSIS — N185 Chronic kidney disease, stage 5: Secondary | ICD-10-CM | POA: Insufficient documentation

## 2016-01-20 DIAGNOSIS — I82712 Chronic embolism and thrombosis of superficial veins of left upper extremity: Secondary | ICD-10-CM | POA: Diagnosis not present

## 2016-01-20 NOTE — Progress Notes (Signed)
Patient name: Karen [name] MRN: [id number] DOB: 04/09/1976 Sex: female   Referred by: Dr. Justin Mend  Reason for referral:  Chief Complaint  Patient presents with  . New Evaluation    Pt needs access,  Has right chest cath (Placed by [facility])  has HD T. Th, and Sat at [facility].    Has pain in her right side of neck all the time since catheter placement.     HISTORY OF PRESENT ILLNESS: Cystoscopy 40 year old female who comes in today for evaluation of dialysis access.  She recently started dialysis through a right-sided catheter.  Patient has a history of diabetes which is a likely source of her renal failure in addition to hypertension.  She is left-handed.  She is on dialysis Tuesday Thursday Saturday  The patient has a history of left leg infection and is status post left below-knee amputation.  She ambulates with a prosthesis.  This is recently been giving her difficulty.  She is scheduled to get refitted in the near future  Past Medical History  Diagnosis Date  . Hypertension   . Anemia   . History of blood transfusion     "last week" (02/02/2013)  . GERD (gastroesophageal reflux disease)   . Osteomyelitis of toe of left foot ([place])     "off and on since 2009; no OR" (02/02/2013)  . Renal failure     acute vs chronic/notes 02/02/2013  . Type II diabetes mellitus ([place]) 1995  . Exertional shortness of breath     "recently; it's fluid" (02/02/2013)    Past Surgical History  Procedure Laterality Date  . Cesarean section  10/18/2006  . Amputation Left 02/06/2013    Procedure: AMPUTATION LEFT GREAT TOE;  Surgeon: Wylene Simmer, MD;  Location: [place];  Service: Orthopedics;  Laterality: Left;  . Amputation Left 06/24/2013    Procedure: AMPUTATION BELOW KNEE ;  Surgeon: Wylene Simmer, MD;  Location: [facility];  Service: Orthopedics;  Laterality: Left;    Social History   Social History  . Marital Status: Single    Spouse Name: N/A  . Number of Children: N/A  . Years of Education: N/A    Occupational History  . Not on file.   Social History Main Topics  . Smoking status: Former Smoker -- 3.00 packs/day for 10 years    Types: Cigarettes    Quit date: 09/21/2004  . Smokeless tobacco: Never Used  . Alcohol Use: No  . Drug Use: No  . Sexual Activity: No   Other Topics Concern  . Not on file   Social History Narrative    No family history on file.  Allergies as of 01/20/2016 - Review Complete 01/20/2016  Allergen Reaction Noted  . Novolog [insulin aspart]  07/10/2013  . Morphine and related Rash   . Penicillins Rash     Current Outpatient Prescriptions on File Prior to Visit  Medication Sig Dispense Refill  . camphor-menthol (SARNA) lotion Apply topically as needed for itching. 1 mL 0  . sodium bicarbonate 650 MG tablet Take 1 tablet (650 mg total) by mouth daily. 30 tablet 1  . insulin regular (NOVOLIN R,HUMULIN R) 100 units/mL injection Inject 0.02-0.1 mLs (2-10 Units total) into the skin 3 (three) times daily before meals. (Patient not taking: Reported on 01/20/2016) 10 mL 12  . torsemide (DEMADEX) 100 MG tablet Take 1 tablet (100 mg total) by mouth daily. (Patient not taking: Reported on 01/20/2016) 30 tablet 1   No current facility-administered  medications on file prior to visit.     REVIEW OF SYSTEMS: Cardiovascular: Negative for chest pain.  Positive for shortness of breath with exertion and when lying flat.  Positive for leg swelling. Pulmonary: Positive for productive cough and wheezing. Neurologic: No weakness, paresthesias, aphasia, or amaurosis. No dizziness. Hematologic: No bleeding problems or clotting disorders. Musculoskeletal: No joint pain or joint swelling. Gastrointestinal: No blood in stool or hematemesis Genitourinary: No dysuria or hematuria. Psychiatric:: No history of major depression. Integumentary: No rashes or ulcers. Constitutional: No fever or chills.  PHYSICAL EXAMINATION:  Filed Vitals:   01/20/16 1320 01/20/16 1323   BP: 176/101 184/102  Pulse: 86 87  Temp: 97.2 F (36.2 C)   TempSrc: Oral   Resp: 24   Height: 5' 5.5" (1.664 m)   Weight: 240 lb (108.863 kg)   SpO2: 98%    Body mass index is 39.32 kg/(m^2). General: The patient appears their stated age.   HEENT:  No gross abnormalities Pulmonary: Respirations are non-labored Abdomen: Soft and non-tender  Musculoskeletal: There are no major deformities.   Neurologic: No focal weakness or paresthesias are detected, Skin: There are no ulcer or rashes noted. Psychiatric: The patient has normal affect. Cardiovascular: There is a regular rate and rhythm without significant murmur appreciated.Palpable right radial pulse  Diagnostic Studies: I have reviewed her vein mapping.  She appears to have small cephalic veins bilaterally.  The right basilic vein does appear to be of adequate caliber, measuring greater than 0.3 cm throughout.   Assessment:  End-stage renal disease Plan: I discussed with the patient proceeding with a first stage right basilic vein transposition.  We discussed the risks and benefits of the procedure and the need for a second operation.  I will also evaluate her cephalic vein in the upper arm to make sure that this is not an adequate vein.  Her procedure been scheduled for Friday, May 12     V. Leia Alf, M.D. [facility] Office: [phone] Pager:  [phone]

## 2016-01-22 DIAGNOSIS — N2581 Secondary hyperparathyroidism of renal origin: Secondary | ICD-10-CM | POA: Insufficient documentation

## 2016-01-24 DIAGNOSIS — E46 Unspecified protein-calorie malnutrition: Secondary | ICD-10-CM | POA: Insufficient documentation

## 2016-01-30 ENCOUNTER — Encounter (HOSPITAL_COMMUNITY): Payer: Self-pay | Admitting: Emergency Medicine

## 2016-01-30 ENCOUNTER — Encounter (HOSPITAL_COMMUNITY): Payer: Self-pay | Admitting: Certified Registered Nurse Anesthetist

## 2016-01-30 MED ORDER — VANCOMYCIN HCL 10 G IV SOLR
1500.0000 mg | INTRAVENOUS | Status: DC
Start: 2016-01-30 — End: 2016-01-31
  Filled 2016-01-30: qty 1500

## 2016-01-30 NOTE — Progress Notes (Signed)
Anesthesia Chart Review:  Pt is a 40 year old female scheduled for R first stage basilic vein transposition on 01/31/2016 with Dr. Trula Slade.   Pt is a same day work up.   PMH includes:  Ischemic cardiomyopathy (EF 40-45% by 06/22/13 echo), DM, anemia, GERD. Former smoker. BMI 39. S/p L BKA 06/24/13.   Pt hospitalized 4/19-4/25/17 at [facility] (see care everywhere) for acute on chronic kidney failure, newESRD (HD initiated), parainfluenza, metabolic acidosis, acute on chronic anemia, hypertensive urgency  BP at Dr. Stephens Shire office 01/20/16: 176/101, 184/102  Medications include: amlodipine, carvedilol, levemir, novolin R  Labs will be obtained DOS.   1 view CXR 01/13/16 (care everywhere):  1. Similar cardiomegaly with interval improvement in interstitial edema and resolution of bilateral pleural effusions. 2. Interval placement of right IJ central venous catheter with its tip projecting over the right atrium.  2 view CXR 01/08/16 (care everywhere): Fluid overload, with cardiomegaly, pulmonary edema, and small bilateral pleural effusions.  EKG 01/08/16: sinus tachycardia (108 bpm). Low voltage QRS, consider pulmonary disease or obesity.   Echo 06/22/13:  - Left ventricle: The cavity size was normal. Wall thickness was normal. Systolic function was mildly to moderately reduced. The estimated ejection fraction was in the range of 40% to 45%. There is moderate hypokinesis of the inferoseptal myocardium. Doppler parameters are consistent with a reversible restrictive pattern, indicative of decreased left ventricular diastolic compliance and/orincreased left atrial pressure (grade 3 diastolic dysfunction). Doppler parameters are consistent with both elevated ventricular end-diastolic filling pressure and elevated left atrial filling pressure. - Ventricular septum: The contour showed systolic flattening. - Mitral valve: Mildly calcified annulus. Mildly thickened leaflets. Moderate regurgitation. - Left  atrium: The atrium was moderately dilated. - Right ventricle: The cavity size was mildly dilated. - Right atrium: The atrium was mildly dilated. - Tricuspid valve: Moderate regurgitation. - Pericardium, extracardiac: A trivial pericardial effusion was identified posterior to the heart.  Reviewed case with Dr. Delma Post. Pt will need further assessment by assigned anesthesiologist DOS.   Willeen Cass, FNP-BC [facility]/Anesthesiology Phone: [phone] 01/30/2016 1:40 PM

## 2016-01-31 ENCOUNTER — Observation Stay (HOSPITAL_COMMUNITY)
Admission: RE | Admit: 2016-01-31 | Discharge: 2016-02-01 | Disposition: A | Discharge status: Home / Self Care | Payer: Medicaid Other | Source: Ambulatory Visit | Attending: Internal Medicine | Admitting: Internal Medicine

## 2016-01-31 ENCOUNTER — Encounter (HOSPITAL_COMMUNITY)
Admission: RE | Disposition: A | Discharge status: Home / Self Care | Payer: Self-pay | Source: Ambulatory Visit | Attending: Emergency Medicine

## 2016-01-31 ENCOUNTER — Encounter (HOSPITAL_COMMUNITY): Payer: Self-pay | Admitting: Certified Registered Nurse Anesthetist

## 2016-01-31 DIAGNOSIS — I120 Hypertensive chronic kidney disease with stage 5 chronic kidney disease or end stage renal disease: Secondary | ICD-10-CM | POA: Diagnosis not present

## 2016-01-31 DIAGNOSIS — Z888 Allergy status to other drugs, medicaments and biological substances status: Secondary | ICD-10-CM | POA: Diagnosis not present

## 2016-01-31 DIAGNOSIS — Z89512 Acquired absence of left leg below knee: Secondary | ICD-10-CM | POA: Diagnosis not present

## 2016-01-31 DIAGNOSIS — I255 Ischemic cardiomyopathy: Secondary | ICD-10-CM | POA: Diagnosis not present

## 2016-01-31 DIAGNOSIS — Z89511 Acquired absence of right leg below knee: Secondary | ICD-10-CM

## 2016-01-31 DIAGNOSIS — N186 End stage renal disease: Secondary | ICD-10-CM

## 2016-01-31 DIAGNOSIS — Z880 Allergy status to penicillin: Secondary | ICD-10-CM | POA: Insufficient documentation

## 2016-01-31 DIAGNOSIS — E1165 Type 2 diabetes mellitus with hyperglycemia: Secondary | ICD-10-CM | POA: Diagnosis not present

## 2016-01-31 DIAGNOSIS — Z992 Dependence on renal dialysis: Secondary | ICD-10-CM

## 2016-01-31 DIAGNOSIS — Z89519 Acquired absence of unspecified leg below knee: Secondary | ICD-10-CM

## 2016-01-31 DIAGNOSIS — Z885 Allergy status to narcotic agent status: Secondary | ICD-10-CM | POA: Diagnosis not present

## 2016-01-31 DIAGNOSIS — Z794 Long term (current) use of insulin: Secondary | ICD-10-CM | POA: Diagnosis not present

## 2016-01-31 DIAGNOSIS — Z9114 Patient's other noncompliance with medication regimen: Secondary | ICD-10-CM | POA: Diagnosis present

## 2016-01-31 DIAGNOSIS — E1100 Type 2 diabetes mellitus with hyperosmolarity without nonketotic hyperglycemic-hyperosmolar coma (NKHHC): Secondary | ICD-10-CM | POA: Diagnosis not present

## 2016-01-31 DIAGNOSIS — F419 Anxiety disorder, unspecified: Secondary | ICD-10-CM | POA: Insufficient documentation

## 2016-01-31 DIAGNOSIS — I10 Essential (primary) hypertension: Secondary | ICD-10-CM | POA: Diagnosis present

## 2016-01-31 DIAGNOSIS — Z87891 Personal history of nicotine dependence: Secondary | ICD-10-CM | POA: Diagnosis not present

## 2016-01-31 DIAGNOSIS — IMO0002 Reserved for concepts with insufficient information to code with codable children: Secondary | ICD-10-CM | POA: Diagnosis present

## 2016-01-31 DIAGNOSIS — Z91148 Patient's other noncompliance with medication regimen for other reason: Secondary | ICD-10-CM

## 2016-01-31 DIAGNOSIS — E1122 Type 2 diabetes mellitus with diabetic chronic kidney disease: Secondary | ICD-10-CM | POA: Diagnosis not present

## 2016-01-31 DIAGNOSIS — K219 Gastro-esophageal reflux disease without esophagitis: Secondary | ICD-10-CM | POA: Insufficient documentation

## 2016-01-31 DIAGNOSIS — Z89522 Acquired absence of left knee: Secondary | ICD-10-CM

## 2016-01-31 DIAGNOSIS — R739 Hyperglycemia, unspecified: Secondary | ICD-10-CM | POA: Diagnosis present

## 2016-01-31 DIAGNOSIS — Z79899 Other long term (current) drug therapy: Secondary | ICD-10-CM | POA: Insufficient documentation

## 2016-01-31 HISTORY — DX: Anxiety disorder, unspecified: F41.9

## 2016-01-31 HISTORY — DX: Ischemic cardiomyopathy: I25.5

## 2016-01-31 LAB — POCT I-STAT 4, (NA,K, GLUC, HGB,HCT)
Glucose, Bld: 664 mg/dL (ref 65–99)
Glucose, Bld: 674 mg/dL (ref 65–99)
HCT: 28 % — ABNORMAL LOW (ref 36.0–46.0)
HCT: 29 % — ABNORMAL LOW (ref 36.0–46.0)
Hemoglobin: 9.5 g/dL — ABNORMAL LOW (ref 12.0–15.0)
Hemoglobin: 9.9 g/dL — ABNORMAL LOW (ref 12.0–15.0)
POTASSIUM: 3.7 mmol/L (ref 3.5–5.1)
Potassium: 3.3 mmol/L — ABNORMAL LOW (ref 3.5–5.1)
SODIUM: 131 mmol/L — AB (ref 135–145)
Sodium: 133 mmol/L — ABNORMAL LOW (ref 135–145)

## 2016-01-31 LAB — CBG MONITORING, ED
GLUCOSE-CAPILLARY: 215 mg/dL — AB (ref 65–99)
GLUCOSE-CAPILLARY: 141 mg/dL — AB (ref 65–99)
Glucose-Capillary: 534 mg/dL — ABNORMAL HIGH (ref 65–99)
Glucose-Capillary: 506 mg/dL — ABNORMAL HIGH (ref 65–99)
Glucose-Capillary: 328 mg/dL — ABNORMAL HIGH (ref 65–99)

## 2016-01-31 LAB — CBC WITH DIFFERENTIAL/PLATELET
Basophils Absolute: 0 10*3/uL (ref 0.0–0.1)
Basophils Relative: 0 %
Eosinophils Absolute: 0.2 10*3/uL (ref 0.0–0.7)
Eosinophils Relative: 2 %
HEMATOCRIT: 24.8 % — AB (ref 36.0–46.0)
Hemoglobin: 7.5 g/dL — ABNORMAL LOW (ref 12.0–15.0)
Lymphocytes Relative: 20 %
Lymphs Abs: 1.9 10*3/uL (ref 0.7–4.0)
MCH: 25 pg — ABNORMAL LOW (ref 26.0–34.0)
MCHC: 30.2 g/dL (ref 30.0–36.0)
MCV: 82.7 fL (ref 78.0–100.0)
MONO ABS: 0.7 10*3/uL (ref 0.1–1.0)
MONOS PCT: 7 %
Neutro Abs: 6.7 10*3/uL (ref 1.7–7.7)
Neutrophils Relative %: 71 %
Platelets: 185 10*3/uL (ref 150–400)
RBC: 3 MIL/uL — ABNORMAL LOW (ref 3.87–5.11)
RDW: 14.2 % (ref 11.5–15.5)
WBC: 9.5 10*3/uL (ref 4.0–10.5)

## 2016-01-31 LAB — I-STAT CHEM 8, ED
BUN: 13 mg/dL (ref 6–20)
Calcium, Ion: 1 mmol/L — ABNORMAL LOW (ref 1.12–1.23)
Chloride: 90 mmol/L — ABNORMAL LOW (ref 101–111)
Creatinine, Ser: 4.1 mg/dL — ABNORMAL HIGH (ref 0.44–1.00)
Glucose, Bld: 615 mg/dL (ref 65–99)
HCT: 28 % — ABNORMAL LOW (ref 36.0–46.0)
Hemoglobin: 9.5 g/dL — ABNORMAL LOW (ref 12.0–15.0)
Potassium: 3.3 mmol/L — ABNORMAL LOW (ref 3.5–5.1)
Sodium: 134 mmol/L — ABNORMAL LOW (ref 135–145)
TCO2: 27 mmol/L (ref 0–100)

## 2016-01-31 LAB — HCG, SERUM, QUALITATIVE: Preg, Serum: NEGATIVE

## 2016-01-31 LAB — GLUCOSE, CAPILLARY
GLUCOSE-CAPILLARY: 119 mg/dL — AB (ref 65–99)
Glucose-Capillary: 166 mg/dL — ABNORMAL HIGH (ref 65–99)

## 2016-01-31 SURGERY — TRANSPOSITION, VEIN, BASILIC
Anesthesia: Choice | Laterality: Right

## 2016-01-31 MED ORDER — DEXTROSE-NACL 5-0.45 % IV SOLN: INTRAVENOUS | Status: DC

## 2016-01-31 MED ORDER — ACETAMINOPHEN 325 MG PO TABS
650.0000 mg | ORAL_TABLET | Freq: Once | ORAL | Status: AC
  Administered 2016-01-31: 650 mg via ORAL
  Filled 2016-01-31: qty 2

## 2016-01-31 MED ORDER — ONDANSETRON HCL 4 MG/2ML IJ SOLN
4.0000 mg | Freq: Four times a day (QID) | INTRAMUSCULAR | Status: DC | PRN
  Administered 2016-01-31: 4 mg via INTRAVENOUS
  Filled 2016-01-31: qty 2

## 2016-01-31 MED ORDER — HEPARIN SODIUM (PORCINE) 5000 UNIT/ML IJ SOLN
5000.0000 [IU] | Freq: Three times a day (TID) | INTRAMUSCULAR | Status: DC
  Administered 2016-02-01: 5000 [IU] via SUBCUTANEOUS

## 2016-01-31 MED ORDER — INSULIN GLARGINE 100 UNIT/ML ~~LOC~~ SOLN
10.0000 [IU] | Freq: Once | SUBCUTANEOUS | Status: AC
  Administered 2016-01-31: 10 [IU] via SUBCUTANEOUS
  Filled 2016-01-31 (×2): qty 0.1

## 2016-01-31 MED ORDER — CARVEDILOL 25 MG PO TABS
25.0000 mg | ORAL_TABLET | Freq: Two times a day (BID) | ORAL | Status: DC
  Administered 2016-01-31 – 2016-02-01 (×3): 25 mg via ORAL
  Filled 2016-01-31: qty 2
  Filled 2016-01-31 (×2): qty 1

## 2016-01-31 MED ORDER — SODIUM BICARBONATE 650 MG PO TABS
650.0000 mg | ORAL_TABLET | Freq: Every day | ORAL | Status: DC
  Administered 2016-01-31 – 2016-02-01 (×2): 650 mg via ORAL
  Filled 2016-01-31 (×3): qty 1

## 2016-01-31 MED ORDER — INSULIN GLARGINE 100 UNIT/ML ~~LOC~~ SOLN
8.0000 [IU] | Freq: Once | SUBCUTANEOUS | Status: AC
  Administered 2016-01-31: 8 [IU] via SUBCUTANEOUS
  Filled 2016-01-31: qty 0.08

## 2016-01-31 MED ORDER — INSULIN LISPRO 100 UNIT/ML ~~LOC~~ SOLN
0.0000 [IU] | Freq: Three times a day (TID) | SUBCUTANEOUS | Status: DC
  Filled 2016-01-31: qty 10

## 2016-01-31 MED ORDER — ACETAMINOPHEN 650 MG RE SUPP: 650.0000 mg | Freq: Four times a day (QID) | RECTAL | Status: DC | PRN

## 2016-01-31 MED ORDER — ONDANSETRON HCL 4 MG/2ML IJ SOLN
4.0000 mg | Freq: Once | INTRAMUSCULAR | Status: AC
  Administered 2016-01-31: 4 mg via INTRAVENOUS
  Filled 2016-01-31: qty 2

## 2016-01-31 MED ORDER — CHLORHEXIDINE GLUCONATE CLOTH 2 % EX PADS: 6.0000 | MEDICATED_PAD | Freq: Once | CUTANEOUS | Status: DC

## 2016-01-31 MED ORDER — SODIUM CHLORIDE 0.9 % IV SOLN
INTRAVENOUS | Status: DC
Start: 2016-01-31 — End: 2016-01-31

## 2016-01-31 MED ORDER — INSULIN LISPRO 100 UNIT/ML CARTRIDGE
0.0000 [IU] | Freq: Three times a day (TID) | SUBCUTANEOUS | Status: DC
  Administered 2016-02-01: 2 [IU] via SUBCUTANEOUS
  Filled 2016-01-31: qty 3

## 2016-01-31 MED ORDER — ACETAMINOPHEN 325 MG PO TABS: 650.0000 mg | ORAL_TABLET | Freq: Four times a day (QID) | ORAL | Status: DC | PRN

## 2016-01-31 MED ORDER — SODIUM CHLORIDE 0.9 % IV BOLUS (SEPSIS)
500.0000 mL | Freq: Once | INTRAVENOUS | Status: AC
Start: 2016-01-31 — End: 2016-01-31
  Administered 2016-01-31: 500 mL via INTRAVENOUS

## 2016-01-31 MED ORDER — OXYCODONE-ACETAMINOPHEN 5-325 MG PO TABS
1.0000 | ORAL_TABLET | Freq: Once | ORAL | Status: AC
  Administered 2016-01-31: 1 via ORAL
  Filled 2016-01-31: qty 1

## 2016-01-31 MED ORDER — AMLODIPINE BESYLATE 5 MG PO TABS
5.0000 mg | ORAL_TABLET | Freq: Every day | ORAL | Status: DC
  Administered 2016-01-31 – 2016-02-01 (×2): 5 mg via ORAL
  Filled 2016-01-31 (×2): qty 1

## 2016-01-31 MED ORDER — SODIUM CHLORIDE 0.9% FLUSH
3.0000 mL | Freq: Two times a day (BID) | INTRAVENOUS | Status: DC
  Administered 2016-01-31 – 2016-02-01 (×2): 3 mL via INTRAVENOUS

## 2016-01-31 MED ORDER — SODIUM CHLORIDE 0.9 % IV SOLN
INTRAVENOUS | Status: AC
  Administered 2016-01-31: 4.7 [IU]/h via INTRAVENOUS
  Filled 2016-01-31: qty 2.5

## 2016-01-31 NOTE — Progress Notes (Signed)
Pt came to floor with Right chest perm catheter accessed, normal saline going KVO. IV team came to assess the access as well had sites for a peripheral. Pt doesn't wish to be stuck. Spoke to Dr.Martin Justin Mend who gave an order to access and run KVO. Will continue to monitor.

## 2016-01-31 NOTE — Anesthesia Preprocedure Evaluation (Deleted)
Anesthesia Evaluation  Patient identified by MRN, date of birth, ID band Patient awake    Reviewed: Allergy & Precautions, H&P , NPO status , Patient's Chart, lab work & pertinent test results  History of Anesthesia Complications Negative for: history of anesthetic complications  Airway Mallampati: II       Dental  (+) Partial Upper, Dental Advisory Given,    Pulmonary shortness of breath, former smoker,    breath sounds clear to auscultation       Cardiovascular Exercise Tolerance: Poor hypertension, Pt. on medications and Pt. on home beta blockers + Peripheral Vascular Disease and +CHF   Rhythm:Regular Rate:Normal  *   ------------------------------------------------------------ Transthoracic Echocardiography  Patient:    Karen [name], Karen [name] Date: 06/22/2013 Gender:     F Age:        40 Height:     170.2cm Weight:     99.3kg BSA:        2.66m^2 Pt. Status: Room:       4E08C    PERFORMING   [facility]  ADMITTING    [place], [place], Dimple Casey  SONOGRAPHER  Jimmy Reel cc:  ------------------------------------------------------------ LV EF: 40% -   45%  ------------------------------------------------------------ Indications:      Cardiomyopathy - ischemic 414.8.  CHF - 428.0.  ------------------------------------------------------------ Study Conclusions  - Left ventricle: The cavity size was normal. Wall thickness   was normal. Systolic function was mildly to moderately   reduced. The estimated ejection fraction was in the range   of 40% to 45%. There is moderate hypokinesis of the   inferoseptal myocardium. Doppler parameters are consistent   with a reversible restrictive pattern, indicative of   decreased left ventricular diastolic compliance and/or   increased left atrial pressure (grade 3 diastolic   dysfunction). Doppler  parameters are consistent with both   elevated ventricular end-diastolic filling pressure and   elevated left atrial filling pressure.    Neuro/Psych PSYCHIATRIC DISORDERS Anxiety negative neurological ROS     GI/Hepatic GERD  Medicated and Controlled,  Endo/Other  diabetes, Poorly Controlled, Type 2Morbid obesity  Renal/GU Renal InsufficiencyRenal disease     Musculoskeletal   Abdominal (+) + obese,   Peds  Hematology  (+) Blood dyscrasia, anemia ,   Anesthesia Other Findings   Reproductive/Obstetrics                             Anesthesia Physical  Anesthesia Plan  ASA: III  Anesthesia Plan: General   Post-op Pain Management:    Induction: Intravenous  Airway Management Planned: Oral ETT  Additional Equipment:   Intra-op Plan:   Post-operative Plan: Extubation in OR  Informed Consent: I have reviewed the patients History and Physical, chart, labs and discussed the procedure including the risks, benefits and alternatives for the proposed anesthesia with the patient or authorized representative who has indicated his/her understanding and acceptance.   Dental advisory given  Plan Discussed with: CRNA and Surgeon  Anesthesia Plan Comments:         Anesthesia Quick Evaluation

## 2016-01-31 NOTE — Progress Notes (Signed)
Pt refusing to be placed on telemetry. Explained importance of tele. Pt still refused

## 2016-01-31 NOTE — Progress Notes (Signed)
In process of getting patient ready for surgery, we attempted x 2 IV placement with no success.  I learned she had diatek and was given the OK to access it by Dr. Weldon Inches. Blood sugar via Istat came back 667, and then 664.  When asked if she had taken her Lantus last night, she did tell me yes, and it was 18 units.  She stated, she does not take SSI. After more questions, and after hearing the 2nd blood sugar result, the patient has told me that she actually hasn't taken her lantus in 2 days.  Yesterday, she consumed a butter pecan protein shake @ dialysis.  At home, she had mac & cheese, 1/2 pork chop, and then for supper 1/2 whopper, 2 cups of coke.  And, she had not taken her lantus last night, nor has she been checking her blood sugars for the last day or so. I went to Dr. Tobias Alexander and relayed info.  I also called the diabetes coordinator, spoke with Larene Beach, who advised to take her to the ED for treatment.   Dr. Tobias Alexander, in turn, let Dr. Trula Slade know all current details, and surgery has been cancelled for today.  Patient is aware and understands.  Dr. Tobias Alexander did come in and explain that surgery, right now is not an option with such a high sugars, and better control will be needed.    Report given to charge nurse in Ed, along with additional RN upon leaving the unit.  DA

## 2016-01-31 NOTE — H&P (Signed)
History and Physical    Karen [name] [id number] DOB: [birth year] DOA: 01/31/2016   PCP: No primary care provider on file. /UNASSIGNED Patient coming from/Resides with: Private residence and lives with her children  Chief Complaint: Hyperglycemia  HPI: Karen [name] is a 40 y.o. female with medical history significant for chronic kidney disease just started dialysis last month, diabetes on Lantus, ischemic cardiomyopathy, diabetic neuropathy, history of left BKA, hypertension who was at this facility being evaluated in the preoperative area to undergo placement of dialysis access by vascular services. While in the preoperative area she was discovered to have a glucose of greater than 600 and was sent to the ER for further evaluation. Upon questioning by the ER staff patient admitted to significant dietary indiscretion in the past 24 hours as well as missing at least 2 doses of every evening Lantus. Patient states she dialyzed yesterday on 5/11 for a complete treatment.  ED Course:  Afebrile-initial BP 208/103-pulse 95 days respirations 20 Follow-up vital signs: BP 138/81-pulse 73-respirations 16 Lab data: Sodium 134, potassium 3.3, BUN 13, creatinine 4.10, glucose 615 with initial reading 674, WBCs 9500 with normal differential, hemoglobin 9.5, platelets 185,000, pregnancy test was negative Insulin infusion titrated   Review of Systems:  In addition to the HPI above,  No Fever-chills, myalgias or other constitutional symptoms No Headache, changes with Vision or hearing, new weakness, tingling, numbness in any extremity, No problems swallowing food or Liquids, indigestion/reflux No Chest pain, Cough or Shortness of Breath, palpitations, orthopnea or DOE No Abdominal pain, N/V; no melena or hematochezia, no dark tarry stools, Bowel movements are regular, No dysuria, hematuria or flank pain No new skin rashes, lesions, masses or bruises, No new joints pains-aches No recent weight gain or  loss No polyuria, polydypsia or polyphagia,   Past Medical History  Diagnosis Date  . Hypertension   . Anemia   . History of blood transfusion     "last week" (02/02/2013)  . GERD (gastroesophageal reflux disease)   . Osteomyelitis of toe of left foot ([place])     "off and on since 2009; no OR" (02/02/2013)  . Renal failure     acute vs chronic/notes 02/02/2013  . Type II diabetes mellitus ([place]) 1995  . Exertional shortness of breath     "recently; it's fluid" (02/02/2013)  . Ischemic cardiomyopathy     by echo 2014  . Anxiety     Past Surgical History  Procedure Laterality Date  . Cesarean section  10/18/2006  . Amputation Left 02/06/2013    Procedure: AMPUTATION LEFT GREAT TOE;  Surgeon: Wylene Simmer, MD;  Location: [facility];  Service: Orthopedics;  Laterality: Left;  . Amputation Left 06/24/2013    Procedure: AMPUTATION BELOW KNEE ;  Surgeon: Wylene Simmer, MD;  Location: [facility];  Service: Orthopedics;  Laterality: Left;  . Insertion of dialysis catheter       reports that she quit smoking about 11 years ago. Her smoking use included Cigarettes. She has a 30 pack-year smoking history. She has never used smokeless tobacco. She reports that she does not drink alcohol or use illicit drugs.  Mobility: Utilizes a cane and a walker, also has a left prosthesis for prior BKA Work history: Disabled   Allergies  Allergen Reactions  . Morphine And Related Hives and Rash  . Novolog [Insulin Aspart] Other (See Comments)    Cramps/ Gi distress  . Penicillins Hives and Rash    Family history reviewed and not pertinent  Prior to Admission medications   Medication Sig Start Date End Date Taking? Authorizing Provider  ALPRAZolam Duanne Moron) 1 MG tablet Take 1 mg by mouth.   Yes Historical Provider, MD  amLODipine (NORVASC) 5 MG tablet Take 5 mg by mouth. 01/14/16  Yes Historical Provider, MD  calcium carbonate (TUMS) 500 MG chewable tablet Chew 500 mg by mouth. 01/14/16  Yes Historical Provider, MD    camphor-menthol (SARNA) lotion Apply topically as needed for itching. 07/13/13  Yes Daniel J Angiulli, PA-C  carvedilol (COREG) 25 MG tablet Take 25 mg by mouth. 01/14/16  Yes Historical Provider, MD  insulin glargine (LANTUS) 100 UNIT/ML injection Inject 18 Units into the skin at bedtime.   Yes Historical Provider, MD  nystatin cream (MYCOSTATIN)  01/14/16  Yes Historical Provider, MD  oxyCODONE-acetaminophen (PERCOCET) 10-325 MG tablet Take by mouth.   Yes Historical Provider, MD  Insulin Detemir (LEVEMIR FLEXPEN) 100 UNIT/ML Pen Inject into the skin. 01/14/16   Historical Provider, MD  insulin regular (NOVOLIN R,HUMULIN R) 100 units/mL injection Inject 0.02-0.1 mLs (2-10 Units total) into the skin 3 (three) times daily before meals. Patient not taking: Reported on 01/20/2016 07/13/13   Lavon Paganini Angiulli, PA-C  sodium bicarbonate 650 MG tablet Take 1 tablet (650 mg total) by mouth daily. 07/13/13   Lavon Paganini Angiulli, PA-C  torsemide (DEMADEX) 100 MG tablet Take 1 tablet (100 mg total) by mouth daily. Patient not taking: Reported on 01/20/2016 07/13/13   Lavon Paganini Angiulli, PA-C  triamcinolone cream (KENALOG) 0.1 % Apply topically.    Historical Provider, MD    Physical Exam: Filed Vitals:   01/31/16 1600 01/31/16 1630 01/31/16 1700 01/31/16 1749  BP: 138/81 115/74 120/80 151/75  Pulse: 73 72 72 74  Temp:    97.9 F (36.6 C)  TempSrc:    Oral  Resp: 16   18  Height:    5' 5.5" (1.664 m)  Weight:    108.836 kg (239 lb 15 oz)  SpO2: 97% 95% 97% 98%      Constitutional: NAD, calm, comfortable Eyes: PERRL, lids and conjunctivae normal ENMT: Mucous membranes are moist. Posterior pharynx clear of any exudate or lesions Neck: normal, supple, no masses, no thyromegaly Respiratory: clear to auscultation bilaterally, no wheezing, no crackles. Normal respiratory effort. No accessory muscle use.  Cardiovascular: Regular rate and rhythm, no murmurs / rubs / gallops. No extremity edema. 2+ pedal  pulses. No carotid bruits.  Abdomen: no tenderness, no masses palpated. No hepatosplenomegaly. Bowel sounds positive.  Musculoskeletal: no clubbing / cyanosis. No joint deformity upper and lower extremities. Good ROM, no contractures. Normal muscle tone. Prior left BKA Skin: no rashes, lesions, ulcers. No induration Neurologic: CN 2-12 grossly intact. Sensation intact, DTR normal. Strength 5/5 x all 4 extremities.  Psychiatric: POOR judgment and insight iinto current disease process. Alert and oriented x 4. Normal mood.    Labs on Admission: I have personally reviewed following labs and imaging studies  CBC:  Recent Labs Lab 01/31/16 1016 01/31/16 1022 01/31/16 1130 01/31/16 1149  WBC  --   --  9.5  --   NEUTROABS  --   --  6.7  --   HGB 9.9* 9.5* 7.5* 9.5*  HCT 29.0* 28.0* 24.8* 28.0*  MCV  --   --  82.7  --   PLT  --   --  185  --    Basic Metabolic Panel:  Recent Labs Lab 01/31/16 1016 01/31/16 1022 01/31/16 1149  NA 131*  133* 134*  K 3.7 3.3* 3.3*  CL  --   --  90*  GLUCOSE 674* 664* 615*  BUN  --   --  13  CREATININE  --   --  4.10*   GFR: Estimated Creatinine Clearance: 23 mL/min (by C-G formula based on Cr of 4.1). Liver Function Tests: No results for input(s): AST, ALT, ALKPHOS, BILITOT, PROT, ALBUMIN in the last 168 hours. No results for input(s): LIPASE, AMYLASE in the last 168 hours. No results for input(s): AMMONIA in the last 168 hours. Coagulation Profile: No results for input(s): INR, PROTIME in the last 168 hours. Cardiac Enzymes: No results for input(s): CKTOTAL, CKMB, CKMBINDEX, TROPONINI in the last 168 hours. BNP (last 3 results) No results for input(s): PROBNP in the last 8760 hours. HbA1C: No results for input(s): HGBA1C in the last 72 hours. CBG:  Recent Labs Lab 01/31/16 1344 01/31/16 1452 01/31/16 1601 01/31/16 1709 01/31/16 1752  GLUCAP 506* 328* 215* 141* 119*   Lipid Profile: No results for input(s): CHOL, HDL, LDLCALC,  TRIG, CHOLHDL, LDLDIRECT in the last 72 hours. Thyroid Function Tests: No results for input(s): TSH, T4TOTAL, FREET4, T3FREE, THYROIDAB in the last 72 hours. Anemia Panel: No results for input(s): VITAMINB12, FOLATE, FERRITIN, TIBC, IRON, RETICCTPCT in the last 72 hours. Urine analysis:    Component Value Date/Time   COLORURINE YELLOW 02/03/2013 0038   APPEARANCEUR CLEAR 02/03/2013 0038   LABSPEC 1.016 02/03/2013 0038   PHURINE 6.0 02/03/2013 0038   GLUCOSEU 250* 02/03/2013 0038   HGBUR MODERATE* 02/03/2013 0038   BILIRUBINUR NEGATIVE 02/03/2013 0038   KETONESUR NEGATIVE 02/03/2013 0038   PROTEINUR >300* 02/03/2013 0038   UROBILINOGEN 0.2 02/03/2013 0038   NITRITE NEGATIVE 02/03/2013 0038   LEUKOCYTESUR NEGATIVE 02/03/2013 0038   Sepsis Labs: @LABRCNTIP (procalcitonin:4,lacticidven:4) )No results found for this or any previous visit (from the past 240 hour(s)).   Radiological Exams on Admission: No results found.   Assessment/Plan Principal Problem:   Uncontrolled type 2 diabetes mellitus with hyperosmolar nonketotic hyperglycemia  -Patient does have elevated anion gap but this in setting of chronic kidney disease on chronic PO bicarbonate therapy; will continue insulin drip until CBG around 200 then transition back to Lantus -EDP previously gave 8 units of Lantus so I will give an additional 10 units to total her home dosage -Patient reports allergy to NovoLog insulin although she tolerated the IV infusion of Novolin insulin without incident; I discussed with pharmacy and they will substitute Humalog insulin and provide a sensitive sliding scale for use overnight  Active Problems:   Essential hypertension -Initially presented with uncontrolled hypertension but subsequently blood pressure has returned to baseline -Continue preadmission Norvasc and carvedilol    CKD (chronic kidney disease) stage V requiring chronic dialysis  -Patient reported complete a full dialysis session  on 5/11; if appropriate will need to be discharged early in the morning so can complete dialysis on Saturday (she reports she dialyzes in [place]) -Patient needs to contact Dr. Trula Slade with vascular surgery to determine when she can return to have dialysis access placed    Cardiomyopathy, ischemic - EF 45-50% with inf WMA by 2D 02/05/13 -Currently compensated -Since was not truly in DKA and sugars rapidly decreasing have discontinued IV dextrose infusion at 100 mL per hour since patient is on dialysis and has chronic acidemia and at risk for volume overload if given high rate IV fluids    Hx of Left BKA -Has prosthesis at bedside  DVT prophylaxis: Subcutaneous heparin Code Status: Full code Family Communication: No family at bedside Disposition Plan: Anticipate discharge back to previous home environment Consults called: None Admission status: Observation/medical floor     WOODS, Geraldo Docker ANP-BC [facility] Pager [phone]   If 7PM-7AM, please contact night-coverage [email or web address] Password [facility]  01/31/2016, 6:24 PM    Examined patient and discussed assessment and plan with ANP Erin Hearing and agree with above. Care during the described time interval was provided by Myself and ANP Ebony Hail. I have reviewed this patient's available data, including medical history, events of note, physical examination, and all test results as part of my evaluation. I have personally reviewed and interpreted all radiology studies.

## 2016-01-31 NOTE — ED Provider Notes (Signed)
CSN: [id number]     Arrival date & time 01/31/16  1058 History   First MD Initiated Contact with Patient 01/31/16 1109     Chief Complaint  Patient presents with  . Hyperglycemia      Patient is a 40 y.o. female presenting with hyperglycemia.  Hyperglycemia Associated symptoms: no abdominal pain, no chest pain, no nausea, no shortness of breath, no vomiting and no weakness   Patient was scheduled to have a dialysis graft done in her right arm. Had testing this morning before the procedure and found to have a sugar of 600. Sent in to the ER. Patient states overall she feels fine. She is a dialysis patient was dialyzed yesterday. She ate Brendolyn Patty last night and did not take her Lantus last night. States she is only on Lantus at night because she was allergic to NovoLog. States it gave her cramps and GI symptoms. No fevers. No dominant. Still makes urine but urine has not changed.  Past Medical History  Diagnosis Date  . Hypertension   . Anemia   . History of blood transfusion     "last week" (02/02/2013)  . GERD (gastroesophageal reflux disease)   . Osteomyelitis of toe of left foot ([place])     "off and on since 2009; no OR" (02/02/2013)  . Renal failure     acute vs chronic/notes 02/02/2013  . Type II diabetes mellitus ([place]) 1995  . Exertional shortness of breath     "recently; it's fluid" (02/02/2013)  . Ischemic cardiomyopathy     by echo 2014  . Anxiety    Past Surgical History  Procedure Laterality Date  . Cesarean section  10/18/2006  . Amputation Left 02/06/2013    Procedure: AMPUTATION LEFT GREAT TOE;  Surgeon: Wylene Simmer, MD;  Location: [facility];  Service: Orthopedics;  Laterality: Left;  . Amputation Left 06/24/2013    Procedure: AMPUTATION BELOW KNEE ;  Surgeon: Wylene Simmer, MD;  Location: [facility];  Service: Orthopedics;  Laterality: Left;  . Insertion of dialysis catheter     History reviewed. No pertinent family history. Social History  Substance Use Topics  . Smoking  status: Former Smoker -- 3.00 packs/day for 10 years    Types: Cigarettes    Quit date: 09/21/2004  . Smokeless tobacco: Never Used  . Alcohol Use: No   OB History    No data available     Review of Systems  Constitutional: Negative for activity change and appetite change.  Eyes: Negative for pain.  Respiratory: Negative for chest tightness and shortness of breath.   Cardiovascular: Negative for chest pain and leg swelling.  Gastrointestinal: Negative for nausea, vomiting, abdominal pain and diarrhea.  Genitourinary: Negative for flank pain.  Musculoskeletal: Negative for back pain and neck stiffness.  Skin: Negative for rash.  Neurological: Negative for weakness, numbness and headaches.  Psychiatric/Behavioral: Negative for behavioral problems.      Allergies  Novolog; Morphine and related; and Penicillins  Home Medications   Prior to Admission medications   Medication Sig Start Date End Date Taking? Authorizing Provider  ALPRAZolam Duanne Moron) 1 MG tablet Take 1 mg by mouth.   Yes Historical Provider, MD  amLODipine (NORVASC) 5 MG tablet Take 5 mg by mouth. 01/14/16  Yes Historical Provider, MD  calcium carbonate (TUMS) 500 MG chewable tablet Chew 500 mg by mouth. 01/14/16  Yes Historical Provider, MD  camphor-menthol (SARNA) lotion Apply topically as needed for itching. 07/13/13  Yes Lavon Paganini Angiulli, PA-C  carvedilol (COREG) 25 MG tablet Take 25 mg by mouth. 01/14/16  Yes Historical Provider, MD  insulin glargine (LANTUS) 100 UNIT/ML injection Inject 18 Units into the skin at bedtime.   Yes Historical Provider, MD  nystatin cream (MYCOSTATIN)  01/14/16  Yes Historical Provider, MD  oxyCODONE-acetaminophen (PERCOCET) 10-325 MG tablet Take by mouth.   Yes Historical Provider, MD  Insulin Detemir (LEVEMIR FLEXPEN) 100 UNIT/ML Pen Inject into the skin. 01/14/16   Historical Provider, MD  insulin regular (NOVOLIN R,HUMULIN R) 100 units/mL injection Inject 0.02-0.1 mLs (2-10 Units  total) into the skin 3 (three) times daily before meals. Patient not taking: Reported on 01/20/2016 07/13/13   Lavon Paganini Angiulli, PA-C  sodium bicarbonate 650 MG tablet Take 1 tablet (650 mg total) by mouth daily. 07/13/13   Lavon Paganini Angiulli, PA-C  torsemide (DEMADEX) 100 MG tablet Take 1 tablet (100 mg total) by mouth daily. Patient not taking: Reported on 01/20/2016 07/13/13   Lavon Paganini Angiulli, PA-C  triamcinolone cream (KENALOG) 0.1 % Apply topically.    Historical Provider, MD   BP 151/87 mmHg  Pulse 75  Temp(Src) 98.3 F (36.8 C) (Oral)  Resp 20  Ht 5' 5.5" (1.664 m)  Wt 240 lb (108.863 kg)  BMI 39.32 kg/m2  SpO2 98%  LMP 01/13/2016 (Exact Date) Physical Exam  Constitutional: She appears well-developed.  HENT:  Head: Atraumatic.  Neck: Neck supple.  Cardiovascular: Normal rate.   Pulmonary/Chest: Effort normal.  Dialysis catheter right chest wall.  Abdominal: Soft. There is no tenderness.  Musculoskeletal:  Left below the knee amputation.  Skin: Skin is warm.    ED Course  Procedures (including critical care time) Labs Review Labs Reviewed  CBC WITH DIFFERENTIAL/PLATELET - Abnormal; Notable for the following:    RBC 3.00 (*)    Hemoglobin 7.5 (*)    HCT 24.8 (*)    MCH 25.0 (*)    All other components within normal limits  POCT I-STAT 4, (NA,K, GLUC, HGB,HCT) - Abnormal; Notable for the following:    Sodium 131 (*)    Glucose, Bld 674 (*)    HCT 29.0 (*)    Hemoglobin 9.9 (*)    All other components within normal limits  POCT I-STAT 4, (NA,K, GLUC, HGB,HCT) - Abnormal; Notable for the following:    Sodium 133 (*)    Potassium 3.3 (*)    Glucose, Bld 664 (*)    HCT 28.0 (*)    Hemoglobin 9.5 (*)    All other components within normal limits  I-STAT CHEM 8, ED - Abnormal; Notable for the following:    Sodium 134 (*)    Potassium 3.3 (*)    Chloride 90 (*)    Creatinine, Ser 4.10 (*)    Glucose, Bld 615 (*)    Calcium, Ion 1.00 (*)    Hemoglobin 9.5 (*)     HCT 28.0 (*)    All other components within normal limits  CBG MONITORING, ED - Abnormal; Notable for the following:    Glucose-Capillary 534 (*)    All other components within normal limits  CBG MONITORING, ED - Abnormal; Notable for the following:    Glucose-Capillary 506 (*)    All other components within normal limits  CBG MONITORING, ED - Abnormal; Notable for the following:    Glucose-Capillary 328 (*)    All other components within normal limits  HCG, SERUM, QUALITATIVE  URINALYSIS, ROUTINE W REFLEX MICROSCOPIC (NOT AT [facility])  HEMOGLOBIN A1C  Imaging Review No results found. I have personally reviewed and evaluated these images and lab results as part of my medical decision-making.   EKG Interpretation None      MDM   Final diagnoses:  Hyperglycemia  Noncompliance with medications    Patient with hyperglycemia. Has been off her medications. States she is allergic to NovoLog and did not want to take it. Glucomander started. Sugars come down from almost 700 500 will admit for further monitoring and treatment.    Davonna Belling, MD 01/31/16 [phone]

## 2016-01-31 NOTE — ED Notes (Signed)
Pt brought from short stay, was scheduled for dialysis graft surgery, CBG noted to be over 600, surgery canceled, pt non-compliant with insulin, A/O X4, NAD

## 2016-01-31 NOTE — ED Notes (Signed)
CBG 215

## 2016-02-01 DIAGNOSIS — I10 Essential (primary) hypertension: Secondary | ICD-10-CM | POA: Diagnosis not present

## 2016-02-01 DIAGNOSIS — I255 Ischemic cardiomyopathy: Secondary | ICD-10-CM | POA: Diagnosis not present

## 2016-02-01 DIAGNOSIS — N186 End stage renal disease: Secondary | ICD-10-CM | POA: Diagnosis not present

## 2016-02-01 DIAGNOSIS — E1100 Type 2 diabetes mellitus with hyperosmolarity without nonketotic hyperglycemic-hyperosmolar coma (NKHHC): Secondary | ICD-10-CM | POA: Diagnosis not present

## 2016-02-01 LAB — URINALYSIS, ROUTINE W REFLEX MICROSCOPIC
BILIRUBIN URINE: NEGATIVE
GLUCOSE, UA: 500 mg/dL — AB
HGB URINE DIPSTICK: NEGATIVE
KETONES UR: NEGATIVE mg/dL
Leukocytes, UA: NEGATIVE
Nitrite: NEGATIVE
PH: 7.5 (ref 5.0–8.0)
Protein, ur: >300 mg/dL — AB
Specific Gravity, Urine: 1.021 (ref 1.005–1.030)

## 2016-02-01 LAB — URINE MICROSCOPIC-ADD ON: RBC / HPF: NONE SEEN RBC/hpf (ref 0–5)

## 2016-02-01 LAB — RENAL FUNCTION PANEL
ALBUMIN: 2.4 g/dL — AB (ref 3.5–5.0)
Anion gap: 11 (ref 5–15)
BUN: 16 mg/dL (ref 6–20)
CO2: 25 mmol/L (ref 22–32)
CREATININE: 5.48 mg/dL — AB (ref 0.44–1.00)
Calcium: 7.7 mg/dL — ABNORMAL LOW (ref 8.9–10.3)
Chloride: 96 mmol/L — ABNORMAL LOW (ref 101–111)
GFR, EST AFRICAN AMERICAN: 10 mL/min — AB (ref >60)
GFR, EST NON AFRICAN AMERICAN: 9 mL/min — AB (ref >60)
Glucose, Bld: 410 mg/dL — ABNORMAL HIGH (ref 65–99)
Phosphorus: 4.7 mg/dL — ABNORMAL HIGH (ref 2.5–4.6)
Potassium: 3.2 mmol/L — ABNORMAL LOW (ref 3.5–5.1)
Sodium: 132 mmol/L — ABNORMAL LOW (ref 135–145)

## 2016-02-01 LAB — GLUCOSE, CAPILLARY
GLUCOSE-CAPILLARY: 297 mg/dL — AB (ref 65–99)
Glucose-Capillary: 156 mg/dL — ABNORMAL HIGH (ref 65–99)

## 2016-02-01 LAB — MRSA PCR SCREENING: MRSA BY PCR: NEGATIVE

## 2016-02-01 LAB — CBC
HEMATOCRIT: 24.3 % — AB (ref 36.0–46.0)
Hemoglobin: 7.4 g/dL — ABNORMAL LOW (ref 12.0–15.0)
MCH: 25.3 pg — ABNORMAL LOW (ref 26.0–34.0)
MCHC: 30.5 g/dL (ref 30.0–36.0)
MCV: 83.2 fL (ref 78.0–100.0)
PLATELETS: 191 10*3/uL (ref 150–400)
RBC: 2.92 MIL/uL — AB (ref 3.87–5.11)
RDW: 14.8 % (ref 11.5–15.5)
WBC: 10 10*3/uL (ref 4.0–10.5)

## 2016-02-01 LAB — HEPATITIS B SURFACE ANTIGEN: HEP B S AG: NEGATIVE

## 2016-02-01 LAB — HEMOGLOBIN A1C
HEMOGLOBIN A1C: 9.8 % — AB (ref 4.8–5.6)
MEAN PLASMA GLUCOSE: 235 mg/dL

## 2016-02-01 MED ORDER — NA FERRIC GLUC CPLX IN SUCROSE 12.5 MG/ML IV SOLN
125.0000 mg | INTRAVENOUS | Status: DC
  Administered 2016-02-01: 125 mg via INTRAVENOUS
  Filled 2016-02-01: qty 10

## 2016-02-01 MED ORDER — INSULIN LISPRO 100 UNIT/ML (KWIKPEN)
0.0000 [IU] | PEN_INJECTOR | Freq: Three times a day (TID) | SUBCUTANEOUS | Status: DC
  Filled 2016-02-01: qty 3

## 2016-02-01 MED ORDER — ALTEPLASE 2 MG IJ SOLR
INTRAMUSCULAR | Status: AC
  Filled 2016-02-01: qty 2

## 2016-02-01 MED ORDER — INSULIN LISPRO 100 UNIT/ML (KWIKPEN)
0.0000 [IU] | PEN_INJECTOR | Freq: Three times a day (TID) | SUBCUTANEOUS | Status: DC
  Administered 2016-02-01: 3 [IU] via SUBCUTANEOUS
  Administered 2016-02-01: 2 [IU] via SUBCUTANEOUS
  Filled 2016-02-01: qty 3

## 2016-02-01 MED ORDER — OXYCODONE-ACETAMINOPHEN 5-325 MG PO TABS
1.0000 | ORAL_TABLET | Freq: Once | ORAL | Status: AC
  Administered 2016-02-01: 1 via ORAL
  Filled 2016-02-01: qty 1

## 2016-02-01 MED ORDER — ALTEPLASE 2 MG IJ SOLR: 2.0000 mg | Freq: Once | INTRAMUSCULAR | Status: DC

## 2016-02-01 MED ORDER — CALCITRIOL 0.25 MCG PO CAPS
0.2500 ug | ORAL_CAPSULE | ORAL | Status: DC
Start: 2016-02-01 — End: 2016-02-02
  Administered 2016-02-01: 0.25 ug via ORAL
  Filled 2016-02-01: qty 1

## 2016-02-01 NOTE — Plan of Care (Signed)
Problem: Food- and Nutrition-Related Knowledge Deficit (NB-1.1) Goal: Nutrition education Formal process to instruct or train a patient/client in a skill or to impart knowledge to help patients/clients voluntarily manage or modify food choices and eating behavior to maintain or improve health. Outcome: Adequate for Discharge Nutrition Education Note  RD consulted for diet education. Pt has been on dialysis for around one month. She receives treatments at a center in [place]. She tells me her church is sponsoring a 6 week nutrition class that an RD will be teaching and she is talking to the OP center RD.   Provided to patient with general review of renal and diabetic diets. Encouraged her to education herself about the foods she generally eats and understand or identify the nutrients of interest. Reviewed food groups and provided written recommended serving sizes specifically determined for patient's current nutritional status. We also discussed the nutrition food label and how to accurately understand amount of nutrients that will be important for her DM and Renal disease dietary management. [place] encouraged her to eat regular meals daily and limit excess fluid intake.  Explained why diet restrictions are needed and provided guide for healthy food choices. Provided specific recommendations on appropriate alternatives.   Discussed importance of protein intake at each meal and snack. Provided examples of how to maximize protein intake throughout the day. Discussed need for fluid restriction with dialysis, importance of minimizing weight gain between HD treatments, and renal-friendly beverage options.  Encouraged pt to discuss specific  follow up diet questions/concerns with RD at HD outpatient facility. Teach back method used.  Expect good compliance.  Body mass index is 39.73 kg/(m^2). Pt meets criteria for obesity based on current BMI.  Current diet order is CHO Modified diet, patient is  consuming approximately 75-100% of meals at this time. Labs and medications reviewed. No further nutrition interventions warranted at this time. RD contact information provided. If additional nutrition issues arise, please re-consult RD.  Colman Cater MS,RD,CSG,LDN Office: [phone] Pager: [phone]

## 2016-02-01 NOTE — Progress Notes (Signed)
Nsg Discharge Note  Admit Date:  01/31/2016 Discharge date: 02/01/2016   Karen [name] to be D/C'd Home per MD order.  AVS completed.  Copy for chart, and copy for patient signed, and dated. Patient/caregiver able to verbalize understanding.  Discharge Medication:   Medication List    TAKE these medications        ALPRAZolam 1 MG tablet  Commonly known as:  XANAX  Take 1 mg by mouth 3 (three) times daily as needed for anxiety.     amLODipine 5 MG tablet  Commonly known as:  NORVASC  Take 5 mg by mouth daily.     camphor-menthol lotion  Commonly known as:  SARNA  Apply topically as needed for itching.     carvedilol 25 MG tablet  Commonly known as:  COREG  Take 25 mg by mouth 2 (two) times daily with a meal.     insulin glargine 100 UNIT/ML injection  Commonly known as:  LANTUS  Inject 18 Units into the skin at bedtime.     nystatin cream  Commonly known as:  MYCOSTATIN  Apply 1 application topically daily.     oxyCODONE-acetaminophen 10-325 MG tablet  Commonly known as:  PERCOCET  Take 1 tablet by mouth every 6 (six) hours as needed for pain.     sodium bicarbonate 650 MG tablet  Take 1 tablet (650 mg total) by mouth daily.        Discharge Assessment: Filed Vitals:   02/01/16 1945 02/01/16 2000  BP: 147/93 121/90  Pulse: 99 83  Temp:  98.2 F (36.8 C)  Resp:  16   Skin clean, dry and intact without evidence of skin break down, no evidence of skin tears noted. IV catheter discontinued intact. Site without signs and symptoms of complications - no redness or edema noted at insertion site, patient denies c/o pain - only slight tenderness at site.  Dressing with slight pressure applied.  D/c Instructions-Education: Discharge instructions given to patient/family with verbalized understanding. D/c education completed with patient/family including follow up instructions, medication list, d/c activities limitations if indicated, with other d/c instructions as  indicated by MD - patient able to verbalize understanding, all questions fully answered. Patient instructed to return to ED, call 911, or call MD for any changes in condition.  Patient escorted via [place], and D/C home via private auto.  Salley Slaughter, RN 02/01/2016 8:33 PM

## 2016-02-01 NOTE — Procedures (Signed)
I have personally attended this patient's dialysis session.  Issues with BFR of TDC (apparently was used for fluids in the OR/out on the floor)  Now running at BFR 250 after power flush 4K bath (K 3.2) EDW 103 kg  Jamal Maes, MD [place] Pager 02/01/2016, 7:30 PM

## 2016-02-01 NOTE — Discharge Summary (Signed)
Physician Discharge Summary  Patient ID: Karen [name] MRN: [id number] DOB/AGE: [birth year] 40 y.o.  Admit date: 01/31/2016 Discharge date: 02/01/2016  Admission Diagnoses:  Discharge Diagnoses:  Principal Problem:   Uncontrolled type 2 diabetes mellitus with hyperosmolar nonketotic hyperglycemia ([place]) Active Problems:   Essential hypertension   Cardiomyopathy, ischemic - EF 45-50% with inf WMA by 2D 02/05/13   CKD (chronic kidney disease) stage V requiring chronic dialysis (HCC)   Hx of BKA   Type 2 diabetes mellitus with hyperosmolarity without nonketotic hyperglycemic-hyperosmolar coma (nkhhc) ([place])   Noncompliance with medications   End-stage renal disease on hemodialysis ([place])   Discharged Condition: stable  Hospital Course: 40 y.o. female with medical history significant for chronic kidney disease just started dialysis last month, diabetes on Lantus, ischemic cardiomyopathy, diabetic neuropathy, history of left BKA, hypertension who was at this facility being evaluated in the preoperative area to undergo placement of dialysis access by vascular services. While in the preoperative area she was discovered to have a glucose of greater than 600 and was sent to the ER for further evaluation. Upon questioning by the ER staff patient admitted to significant dietary indiscretion in the past 24 hours as well as missing at least 2 doses of every evening Lantus. Patient states she dialyzed yesterday on 5/11 for a complete treatment.  Consults:   Significant Diagnostic Studies:   Treatments:   Discharge Exam: Blood pressure 148/90, pulse 86, temperature 98.4 F (36.9 C), temperature source Oral, resp. rate 16, height 5' 5.5" (1.664 m), weight 110 kg (242 lb 8.1 oz), last menstrual period 01/13/2016, SpO2 100 %.   Disposition: 06-Home-Health Care Svc  Discharge Instructions    Diet - low sodium heart healthy    Complete by:  As directed      Diet Carb Modified    Complete by:  As  directed      Increase activity slowly    Complete by:  As directed             Medication List    TAKE these medications        ALPRAZolam 1 MG tablet  Commonly known as:  XANAX  Take 1 mg by mouth 3 (three) times daily as needed for anxiety.     amLODipine 5 MG tablet  Commonly known as:  NORVASC  Take 5 mg by mouth daily.     camphor-menthol lotion  Commonly known as:  SARNA  Apply topically as needed for itching.     carvedilol 25 MG tablet  Commonly known as:  COREG  Take 25 mg by mouth 2 (two) times daily with a meal.     insulin glargine 100 UNIT/ML injection  Commonly known as:  LANTUS  Inject 18 Units into the skin at bedtime.     nystatin cream  Commonly known as:  MYCOSTATIN  Apply 1 application topically daily.     oxyCODONE-acetaminophen 10-325 MG tablet  Commonly known as:  PERCOCET  Take 1 tablet by mouth every 6 (six) hours as needed for pain.     sodium bicarbonate 650 MG tablet  Take 1 tablet (650 mg total) by mouth daily.           Follow-up Information    Please follow up.   Why:  Follow up with PCP and Nephrology. Follo up with the vascular surgery team      Signed: Bonnell Public 02/01/2016, 10:19 AM

## 2016-02-04 ENCOUNTER — Other Ambulatory Visit: Payer: Self-pay

## 2016-02-11 DIAGNOSIS — Z4802 Encounter for removal of sutures: Secondary | ICD-10-CM | POA: Insufficient documentation

## 2016-02-18 DIAGNOSIS — T829XXA Unspecified complication of cardiac and vascular prosthetic device, implant and graft, initial encounter: Secondary | ICD-10-CM | POA: Insufficient documentation

## 2016-02-19 DIAGNOSIS — D509 Iron deficiency anemia, unspecified: Secondary | ICD-10-CM | POA: Insufficient documentation

## 2016-02-26 ENCOUNTER — Encounter (HOSPITAL_COMMUNITY): Payer: Self-pay | Admitting: *Deleted

## 2016-02-27 ENCOUNTER — Encounter (HOSPITAL_COMMUNITY): Payer: Self-pay | Admitting: Certified Registered Nurse Anesthetist

## 2016-02-27 MED ORDER — VANCOMYCIN HCL 10 G IV SOLR
1500.0000 mg | INTRAVENOUS | Status: DC
  Filled 2016-02-27: qty 1500

## 2016-02-27 NOTE — Anesthesia Preprocedure Evaluation (Deleted)
Anesthesia Evaluation    Reviewed: Allergy & Precautions, H&P , NPO status , Patient's Chart, lab work & pertinent test results  History of Anesthesia Complications Negative for: history of anesthetic complications  Airway Mallampati: II       Dental  (+) Partial Upper, Dental Advisory Given,    Pulmonary shortness of breath and with exertion, former smoker,    breath sounds clear to auscultation       Cardiovascular Exercise Tolerance: Poor hypertension, Pt. on medications and Pt. on home beta blockers + Peripheral Vascular Disease and +CHF   Rhythm:Regular Rate:Normal  EF 45-50% with inf WMA by 2D 02/05/13   Neuro/Psych PSYCHIATRIC DISORDERS Anxiety negative neurological ROS     GI/Hepatic GERD  Medicated and Controlled,  Endo/Other  diabetes, Poorly Controlled, Type 2Morbid obesity  Renal/GU Renal Insufficiency, Dialysis and ESRFRenal disease     Musculoskeletal   Abdominal (+) + obese,   Peds  Hematology  (+) Blood dyscrasia, anemia ,   Anesthesia Other Findings Pt hospitalized 4/19-4/25/17 at [facility] (see care everywhere) for acute on chronic kidney failure, newESRD (HD initiated), parainfluenza, metabolic acidosis, acute on chronic anemia, hypertensive urgency  Surgery originally scheduled for 01/31/16 but on arrival pt's CBG > 600 d/t non-compliance with lantus and diet regimen. --> ED with case rescheduled for today.   Reproductive/Obstetrics                         BP Readings from Last 3 Encounters:  02/01/16 121/90  01/20/16 184/102  07/13/13 130/80   Lab Results  Component Value Date   WBC 10.0 02/01/2016   HGB 7.4* 02/01/2016   HCT 24.3* 02/01/2016   MCV 83.2 02/01/2016   PLT 191 02/01/2016     Chemistry      Component Value Date/Time   NA 132* 02/01/2016 1614   K 3.2* 02/01/2016 1614   CL 96* 02/01/2016 1614   CO2 25 02/01/2016 1614   BUN 16 02/01/2016 1614   CREATININE  5.48* 02/01/2016 1614      Component Value Date/Time   CALCIUM 7.7* 02/01/2016 1614   ALKPHOS 218* 07/03/2013 0500   AST 44* 07/03/2013 0500   ALT 44* 07/03/2013 0500   BILITOT 0.2* 07/03/2013 0500      Anesthesia Physical  Anesthesia Plan  ASA: III  Anesthesia Plan: General   Post-op Pain Management:    Induction: Intravenous  Airway Management Planned: Oral ETT  Additional Equipment:   Intra-op Plan:   Post-operative Plan: Extubation in OR  Informed Consent: I have reviewed the patients History and Physical, chart, labs and discussed the procedure including the risks, benefits and alternatives for the proposed anesthesia with the patient or authorized representative who has indicated his/her understanding and acceptance.   Dental advisory given  Plan Discussed with: CRNA and Anesthesiologist  Anesthesia Plan Comments: ([name] glucose readings were 688 and on repeat were over 700. She has stable vital signs and reports being asymptomatic. Her case will have to be canceled in the setting of severely uncontrolled diabetes. Unfortunately her last scheduled procedure was canceled due to a CBG of over 600, she went to the ED and then was admitted overnight for insulin infusion with [facility] hospital[facility]. She is refusing going to the ED or being admitted overnight this time and is leaving AMA. We have offered these options as well as a conversation with our diabetes coordinator. She refuses all offers of aid and has signed the paperwork. She  and her friend have information to report to the ED and call her primary care doctor if she becomes short of breath, headache, syncope, or chest pain. )       Anesthesia Quick Evaluation

## 2016-02-27 NOTE — Progress Notes (Signed)
Left message on pt's voicemail instructing her to be here at 5:30 AM.

## 2016-02-28 ENCOUNTER — Ambulatory Visit (HOSPITAL_COMMUNITY)
Admission: RE | Admit: 2016-02-28 | Discharge: 2016-02-28 | Disposition: A | Discharge status: Home / Self Care | Payer: Medicaid Other | Source: Ambulatory Visit | Attending: Surgery | Admitting: Surgery

## 2016-02-28 ENCOUNTER — Encounter (HOSPITAL_COMMUNITY)
Admission: RE | Disposition: A | Discharge status: Home / Self Care | Payer: Self-pay | Source: Ambulatory Visit | Attending: Surgery

## 2016-02-28 DIAGNOSIS — E1122 Type 2 diabetes mellitus with diabetic chronic kidney disease: Secondary | ICD-10-CM | POA: Insufficient documentation

## 2016-02-28 DIAGNOSIS — N186 End stage renal disease: Secondary | ICD-10-CM | POA: Insufficient documentation

## 2016-02-28 DIAGNOSIS — Z79899 Other long term (current) drug therapy: Secondary | ICD-10-CM | POA: Diagnosis not present

## 2016-02-28 DIAGNOSIS — Z89512 Acquired absence of left leg below knee: Secondary | ICD-10-CM | POA: Diagnosis not present

## 2016-02-28 DIAGNOSIS — Z87891 Personal history of nicotine dependence: Secondary | ICD-10-CM | POA: Insufficient documentation

## 2016-02-28 DIAGNOSIS — I120 Hypertensive chronic kidney disease with stage 5 chronic kidney disease or end stage renal disease: Secondary | ICD-10-CM | POA: Diagnosis not present

## 2016-02-28 DIAGNOSIS — Z992 Dependence on renal dialysis: Secondary | ICD-10-CM | POA: Insufficient documentation

## 2016-02-28 DIAGNOSIS — Z5309 Procedure and treatment not carried out because of other contraindication: Secondary | ICD-10-CM | POA: Diagnosis not present

## 2016-02-28 DIAGNOSIS — K219 Gastro-esophageal reflux disease without esophagitis: Secondary | ICD-10-CM | POA: Insufficient documentation

## 2016-02-28 DIAGNOSIS — Z794 Long term (current) use of insulin: Secondary | ICD-10-CM | POA: Diagnosis not present

## 2016-02-28 LAB — I-STAT BETA HCG BLOOD, ED (NOT ORDERABLE): I-stat hCG, quantitative: <5 m[IU]/mL (ref <5)

## 2016-02-28 LAB — POCT I-STAT 4, (NA,K, GLUC, HGB,HCT)
Glucose, Bld: 688 mg/dL (ref 65–99)
HEMATOCRIT: 31 % — AB (ref 36.0–46.0)
HEMOGLOBIN: 10.5 g/dL — AB (ref 12.0–15.0)
Potassium: 3.4 mmol/L — ABNORMAL LOW (ref 3.5–5.1)
Sodium: 132 mmol/L — ABNORMAL LOW (ref 135–145)

## 2016-02-28 SURGERY — TRANSPOSITION, VEIN, BASILIC
Anesthesia: Choice | Laterality: Right

## 2016-02-28 MED ORDER — 0.9 % SODIUM CHLORIDE (POUR BTL) OPTIME: TOPICAL | Status: DC | PRN

## 2016-02-28 MED ORDER — PROPOFOL 10 MG/ML IV BOLUS
INTRAVENOUS | Status: AC
  Filled 2016-02-28: qty 40

## 2016-02-28 MED ORDER — LIDOCAINE-EPINEPHRINE (PF) 1 %-1:200000 IJ SOLN
INTRAMUSCULAR | Status: AC
  Filled 2016-02-28: qty 30

## 2016-02-28 MED ORDER — SODIUM CHLORIDE 0.9 % IV SOLN: INTRAVENOUS | Status: DC | PRN

## 2016-02-28 MED ORDER — SODIUM CHLORIDE 0.9 % IV SOLN: INTRAVENOUS | Status: DC

## 2016-02-28 MED ORDER — CHLORHEXIDINE GLUCONATE CLOTH 2 % EX PADS: 6.0000 | MEDICATED_PAD | Freq: Once | CUTANEOUS | Status: DC

## 2016-02-28 MED ORDER — HEPARIN SOD (PORK) LOCK FLUSH 100 UNIT/ML IV SOLN
INTRAVENOUS | Status: AC
  Filled 2016-02-28: qty 5

## 2016-02-28 MED ORDER — MIDAZOLAM HCL 2 MG/2ML IJ SOLN
INTRAMUSCULAR | Status: AC
  Filled 2016-02-28: qty 2

## 2016-02-28 MED ORDER — FENTANYL CITRATE (PF) 250 MCG/5ML IJ SOLN
INTRAMUSCULAR | Status: AC
  Filled 2016-02-28: qty 5

## 2016-02-28 NOTE — H&P (Signed)
Patient name: Karen MooreMRN: [id number] DOB: 1977-02-27Sex: female   Referred by: Dr. Justin Mend  Reason for referral:  Chief Complaint  Patient presents with  . New Evaluation    Pt needs access, Has right chest cath (Placed by [facility]) has HD T. Th, and Sat at [facility] Has pain in her right side of neck all the time since catheter placement.     HISTORY OF PRESENT ILLNESS: Cystoscopy 40 year old female who comes in today for evaluation of dialysis access. She recently started dialysis through a right-sided catheter. Patient has a history of diabetes which is a likely source of her renal failure in addition to hypertension. She is left-handed. She is on dialysis Tuesday Thursday Saturday  The patient has a history of left leg infection and is status post left below-knee amputation. She ambulates with a prosthesis. This is recently been giving her difficulty. She is scheduled to get refitted in the near future  Past Medical History  Diagnosis Date  . Hypertension   . Anemia   . History of blood transfusion     "last week" (02/02/2013)  . GERD (gastroesophageal reflux disease)   . Osteomyelitis of toe of left foot ([place])     "off and on since 2009; no OR" (02/02/2013)  . Renal failure     acute vs chronic/notes 02/02/2013  . Type II diabetes mellitus ([place]) 1995  . Exertional shortness of breath     "recently; it's fluid" (02/02/2013)    Past Surgical History  Procedure Laterality Date  . Cesarean section  10/18/2006  . Amputation Left 02/06/2013    Procedure: AMPUTATION LEFT GREAT TOE; Surgeon: Wylene Simmer, MD; Location: [facility]; Service: Orthopedics; Laterality: Left;  . Amputation Left 06/24/2013    Procedure: AMPUTATION BELOW KNEE ; Surgeon: Wylene Simmer, MD; Location: [facility]; Service: Orthopedics; Laterality: Left;    Social History   Social History  . Marital  Status: Single    Spouse Name: N/A  . Number of Children: N/A  . Years of Education: N/A   Occupational History  . Not on file.   Social History Main Topics  . Smoking status: Former Smoker -- 3.00 packs/day for 10 years    Types: Cigarettes    Quit date: 09/21/2004  . Smokeless tobacco: Never Used  . Alcohol Use: No  . Drug Use: No  . Sexual Activity: No   Other Topics Concern  . Not on file   Social History Narrative    No family history on file.  Allergies as of 01/20/2016 - Review Complete 01/20/2016  Allergen Reaction Noted  . Novolog [insulin aspart]  07/10/2013  . Morphine and related Rash   . Penicillins Rash     Current Outpatient Prescriptions on File Prior to Visit  Medication Sig Dispense Refill  . camphor-menthol (SARNA) lotion Apply topically as needed for itching. 1 mL 0  . sodium bicarbonate 650 MG tablet Take 1 tablet (650 mg total) by mouth daily. 30 tablet 1  . insulin regular (NOVOLIN R,HUMULIN R) 100 units/mL injection Inject 0.02-0.1 mLs (2-10 Units total) into the skin 3 (three) times daily before meals. (Patient not taking: Reported on 01/20/2016) 10 mL 12  . torsemide (DEMADEX) 100 MG tablet Take 1 tablet (100 mg total) by mouth daily. (Patient not taking: Reported on 01/20/2016) 30 tablet 1   No current facility-administered medications on file prior to visit.     REVIEW OF SYSTEMS: Cardiovascular: Negative for chest pain. Positive for shortness of breath  with exertion and when lying flat. Positive for leg swelling. Pulmonary: Positive for productive cough and wheezing. Neurologic: No weakness, paresthesias, aphasia, or amaurosis. No dizziness. Hematologic: No bleeding problems or clotting disorders. Musculoskeletal: No joint pain or joint swelling. Gastrointestinal: No blood in stool or hematemesis Genitourinary: No dysuria or  hematuria. Psychiatric:: No history of major depression. Integumentary: No rashes or ulcers. Constitutional: No fever or chills.  PHYSICAL EXAMINATION:  Filed Vitals:   01/20/16 1320 01/20/16 1323  BP: 176/101 184/102  Pulse: 86 87  Temp: 97.2 F (36.2 C)   TempSrc: Oral   Resp: 24   Height: 5' 5.5" (1.664 m)   Weight: 240 lb (108.863 kg)   SpO2: 98%    Body mass index is 39.32 kg/(m^2). General: The patient appears their stated age.  HEENT: No gross abnormalities Pulmonary: Respirations are non-labored Abdomen: Soft and non-tender  Musculoskeletal: There are no major deformities.  Neurologic: No focal weakness or paresthesias are detected, Skin: There are no ulcer or rashes noted. Psychiatric: The patient has normal affect. Cardiovascular: There is a regular rate and rhythm without significant murmur appreciated.Palpable right radial pulse  Diagnostic Studies: I have reviewed her vein mapping. She appears to have small cephalic veins bilaterally. The right basilic vein does appear to be of adequate caliber, measuring greater than 0.3 cm throughout.   Assessment:  End-stage renal disease Plan: I discussed with the patient proceeding with a first stage right basilic vein transposition. We discussed the risks and benefits of the procedure and the need for a second operation. I will also evaluate her cephalic vein in the upper arm to make sure that this is not an adequate vein. Her procedure been scheduled for Friday, May 12     V. Leia Alf, M.D. [facility] Office: [phone] Pager: [phone]       Here for HD access. 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  Procedure discussed again with patient.  All questions answered  Wells Luverne Zerkle

## 2016-02-28 NOTE — Progress Notes (Signed)
Right chest HDC , blue port disconnected from IV fluids and flushed with 8ml NS and 1.9 ml heparin 1000units /ml instilled capped and clamped.

## 2016-02-28 NOTE — Progress Notes (Signed)
Patient's serum glucose on Istat 688, verified and came back 700.  Patient stated she was not going to ED.  Dr. Trula Slade has cancelled surgery.  Patient stated Dr. Trula Slade told her to go to her doctor.  Diabetes coordinator was called and given patient's situation and stated she will call patient.

## 2016-03-02 LAB — POCT I-STAT 4, (NA,K, GLUC, HGB,HCT)
Glucose, Bld: >700 mg/dL (ref 65–99)
HCT: 31 % — ABNORMAL LOW (ref 36.0–46.0)
Hemoglobin: 10.5 g/dL — ABNORMAL LOW (ref 12.0–15.0)
Potassium: 3.4 mmol/L — ABNORMAL LOW (ref 3.5–5.1)
Sodium: 132 mmol/L — ABNORMAL LOW (ref 135–145)

## 2016-05-14 ENCOUNTER — Other Ambulatory Visit: Payer: Self-pay | Admitting: *Deleted

## 2016-05-19 ENCOUNTER — Encounter (HOSPITAL_COMMUNITY): Payer: Self-pay | Admitting: Vascular Surgery

## 2016-05-19 ENCOUNTER — Encounter (HOSPITAL_COMMUNITY): Payer: Self-pay | Admitting: *Deleted

## 2016-05-19 NOTE — Progress Notes (Signed)
Pt denies SOB, chest pain, and being under the care of a cardiologist.  Pt stated that she was never advised to follow up with a cardiologist after having the echo, pt stated " when they did the test they just figured it was because I had an infection in my body."  Pt denies having a cardiac cath but stated that her last EKG was 02/2016, echo 10/ 2014 and stress test was 10 or more years ago. Pt denies being treated by an endocrinologist but stated that her PCP, Dr. York Ram of General Medicine in [place] manages her blood glucose. Pt stated that she now takes 18 units of Lantus insulin at HS and 10 units in the morning along with Humalog as a  sliding scale. Pt stated that her fasting blood glucose usually ranges between 120's-130's and the highest has been  278. Pt made aware of diabetes protocol to check blood glucose every 2 hours prior to arrival, interventions for a BS<70 ( glucose tabs, gel, or 4 ounces of clear juice such as apple or cranberry) and > 220 (half of SS dose). Pt made aware to take 9 units of Lantus tonight and 5 units morning of procedure unless otherwise instructed by MD. Pt made aware to stop taking vitamins, fish oil and herbal medications. Do not take any NSAIDs ie: Ibuprofen, Advil, Naproxen, BC and Goody powder.  Pt verbalized understanding of all pre-op instructions. Anesthesia asked to review pt history.

## 2016-05-19 NOTE — Progress Notes (Signed)
Anesthesia Chart Review: SAME DAY WORK-UP.   Patient is a 40 year old female scheduled for first stage right basilic vein transposition on 05/20/16 by  Dr. Trula Slade. Procedure was initially scheduled for 01/31/16 (see anesthesia note by Willeen Cass, FNP-BC), and reviewed with anesthesiologist Dr. Delma Post at that time. Unfortunately, she presented with fasting glucose of > 600. She was sent to the ED and admitted for uncontrolled diabetes mellitus type 2 with hyperosmolar nonketotic hyperglycemia. Surgery was rescheduled for 02/28/16, but apparently she again showed up with a fasting glucose of > 600. Case was canceled, but she refused to go to the ED and signed out AMA. She was instructed to follow-up with her medical doctor Dr. York Ram. She now reports that Humalog SSI was added and fasting CBG are primarily 120's-130's (high of 278).    PMH includes (presumed) ischemic cardiomyopathy (EF 40-45% by 06/22/13 echo), DM2 with neuropathy, ESRD (on hemodialysis TTS in [place]; s/p right IJ dialysis catheter 01/10/16), anemia, GERD, obesity, former smoker, s/p left BKA 06/24/13.   She was seen by cardiologist Dr. Recardo Evangelist during a 01/2013 hospitalization for an episode of NSVT versus aflutter in the setting of volume overload/acute renal failure and left great toe osteomyelitis. Questionable arrhythmia was was felt to be likely artifact; however, echo did show EF 45-50% with inferior wall motion abnormality suggestive of ischemic etiology. As of 02/07/14, Dr. Gwenlyn Found recommended b-blocker therapy, but not further work-up at that time as patient was asymptomatic and not felt to be a candidate for cath due to renal dysfunction. She was later admitted 06/2013 for acute respiratory failure with acute combined diastolic and systolic CHF and left foot wound infection.  Echo during that admission showed similar findings. She underwent left BKA 06/24/13 (Dr. Doran Durand). Today, she told her PAT phone RN Sherlynn Stalls that she does not  recall being instructed to follow-up with cardiology. To her recollection, she was told echo findings could have been related to her underlying foot infections at the time. She denied SOB and chest pain. She is tolerating HD TTS.   Meds currently listed include Xanax, amlodipine, Coreg, Lantus 10 Units AM and 18 Unit HS, Humalog SSI, Percocet.  EKG 02/28/16: NSR, cannot rule out inferior infarct (age undetermined), cannot rule out anterior infarct (age undetermined), ST/T wave abnormality, consider lateral ischemia. High lateral T wave inversion is new when compared to 02/07/13 tracing.   Echo 06/22/13:  - Left ventricle: The cavity size was normal. Wall thickness was normal. Systolic function was mildly to moderately reduced. The estimated ejection fraction was in the range of 40% to 45%. There is moderate hypokinesis of the inferoseptal myocardium. Doppler parameters are consistent with a reversible restrictive pattern, indicative of decreased left ventricular diastolic compliance and/orincreased left atrial pressure (grade 3 diastolic dysfunction). Doppler parameters are consistent with both elevated ventricular end-diastolic filling pressure and elevated left atrial filling pressure. - Ventricular septum: The contour showed systolic flattening. - Mitral valve: Mildly calcified annulus. Mildly thickened leaflets. Moderate regurgitation. - Left atrium: The atrium was moderately dilated. - Right ventricle: The cavity size was mildly dilated. - Right atrium: The atrium was mildly dilated. - Tricuspid valve: Moderate regurgitation. - Pericardium, extracardiac: A trivial pericardial effusion was identified posterior to the heart.  1V CXR 01/13/16 [facility]; Care Everywhere):  1. Similar cardiomegaly with interval improvement in interstitial edema and resolution of bilateral pleural effusions. 2. Interval placement of right IJ central venous catheter with its tip projecting over the right atrium.  2V  CXR  01/08/16 [facility]; Care Everywhere): Fluid overload, with cardiomegaly, pulmonary edema, and small bilateral pleural effusions.  Discussed above with anesthesiologist Dr. Orene Desanctis. Patient with get an ISTAT4 on arrival which will give [place] a glucose result. Since there were some changes on her June EKG, he would also like her to get a repeat EKG. Further evaluation of patient and review of same day lab/EKG results on the day of surgery by her anesthesiologist. Definitive anesthesia plan at that time.  George Hugh [facility] Short Stay Center/Anesthesiology Phone [phone] 05/19/2016 4:15 PM

## 2016-05-20 ENCOUNTER — Encounter (HOSPITAL_COMMUNITY): Payer: Self-pay | Admitting: Certified Registered"

## 2016-05-20 ENCOUNTER — Encounter (HOSPITAL_COMMUNITY)
Admission: RE | Disposition: A | Discharge status: Home / Self Care | Payer: Self-pay | Source: Ambulatory Visit | Attending: Surgery

## 2016-05-20 ENCOUNTER — Ambulatory Visit (HOSPITAL_COMMUNITY)
Admission: RE | Admit: 2016-05-20 | Discharge: 2016-05-20 | Disposition: A | Discharge status: Home / Self Care | Payer: Medicare Other | Source: Ambulatory Visit | Attending: Surgery | Admitting: Surgery

## 2016-05-20 DIAGNOSIS — Z87891 Personal history of nicotine dependence: Secondary | ICD-10-CM | POA: Diagnosis not present

## 2016-05-20 DIAGNOSIS — I255 Ischemic cardiomyopathy: Secondary | ICD-10-CM | POA: Insufficient documentation

## 2016-05-20 DIAGNOSIS — K219 Gastro-esophageal reflux disease without esophagitis: Secondary | ICD-10-CM | POA: Insufficient documentation

## 2016-05-20 DIAGNOSIS — Z538 Procedure and treatment not carried out for other reasons: Secondary | ICD-10-CM | POA: Insufficient documentation

## 2016-05-20 DIAGNOSIS — Z992 Dependence on renal dialysis: Secondary | ICD-10-CM | POA: Insufficient documentation

## 2016-05-20 DIAGNOSIS — E1122 Type 2 diabetes mellitus with diabetic chronic kidney disease: Secondary | ICD-10-CM | POA: Diagnosis not present

## 2016-05-20 DIAGNOSIS — E1140 Type 2 diabetes mellitus with diabetic neuropathy, unspecified: Secondary | ICD-10-CM | POA: Diagnosis not present

## 2016-05-20 DIAGNOSIS — I120 Hypertensive chronic kidney disease with stage 5 chronic kidney disease or end stage renal disease: Secondary | ICD-10-CM | POA: Diagnosis not present

## 2016-05-20 DIAGNOSIS — Z89512 Acquired absence of left leg below knee: Secondary | ICD-10-CM | POA: Insufficient documentation

## 2016-05-20 DIAGNOSIS — N186 End stage renal disease: Secondary | ICD-10-CM | POA: Insufficient documentation

## 2016-05-20 LAB — POCT I-STAT 4, (NA,K, GLUC, HGB,HCT)
Glucose, Bld: 100 mg/dL — ABNORMAL HIGH (ref 65–99)
HCT: 38 % (ref 36.0–46.0)
Hemoglobin: 12.9 g/dL (ref 12.0–15.0)
Potassium: 4.5 mmol/L (ref 3.5–5.1)
Sodium: 140 mmol/L (ref 135–145)

## 2016-05-20 LAB — HCG, SERUM, QUALITATIVE: PREG SERUM: NEGATIVE

## 2016-05-20 SURGERY — CANCELLED PROCEDURE
Laterality: Right

## 2016-05-20 MED ORDER — HYDRALAZINE HCL 20 MG/ML IJ SOLN
5.0000 mg | Freq: Once | INTRAMUSCULAR | Status: AC
  Administered 2016-05-20: 5 mg via INTRAVENOUS
  Filled 2016-05-20: qty 0.25

## 2016-05-20 MED ORDER — SODIUM CHLORIDE 0.9 % IV SOLN
INTRAVENOUS | Status: DC
  Administered 2016-05-20: 12:00:00 via INTRAVENOUS

## 2016-05-20 MED ORDER — PROPOFOL 10 MG/ML IV BOLUS
INTRAVENOUS | Status: AC
  Filled 2016-05-20: qty 20

## 2016-05-20 MED ORDER — HYDRALAZINE HCL 20 MG/ML IJ SOLN
INTRAMUSCULAR | Status: AC
  Administered 2016-05-20: 5 mg via INTRAVENOUS
  Filled 2016-05-20: qty 1

## 2016-05-20 MED ORDER — VANCOMYCIN HCL 10 G IV SOLR
1500.0000 mg | INTRAVENOUS | Status: DC
  Filled 2016-05-20: qty 1500

## 2016-05-20 MED ORDER — FENTANYL CITRATE (PF) 100 MCG/2ML IJ SOLN
INTRAMUSCULAR | Status: AC
  Filled 2016-05-20: qty 4

## 2016-05-20 MED ORDER — CHLORHEXIDINE GLUCONATE CLOTH 2 % EX PADS: 6.0000 | MEDICATED_PAD | Freq: Once | CUTANEOUS | Status: DC

## 2016-05-20 NOTE — Progress Notes (Signed)
Patient's blood pressure is very elevated.  Notified anesthesia who stated that the patient will be seen

## 2016-05-20 NOTE — Progress Notes (Signed)
Patient is being cancelled because of blood pressure is too elevated for surgery.  Dr. Orene Desanctis instructed patient to take an extra dose of her Norvasc tonight to help decrease her blood pressure.  Instructed for patient to be discharged from short stay unit to home.  Order for iv team to deceases dialysis port before going home.

## 2016-05-20 NOTE — H&P (Signed)
Patient name: Karen [name]             MRN: [id number]                  DOB: November 13, 1975                    Sex: female   Referred by: Dr. Justin Mend  Reason for referral:      Chief Complaint  Patient presents with  . New Evaluation    Pt needs access,  Has right chest cath (Placed by [facility])  has HD T. Th, and Sat at [facility].    Has pain in her right side of neck all the time since catheter placement.     HISTORY OF PRESENT ILLNESS: Cystoscopy 40 year old female who comes in today for evaluation of dialysis access.  She recently started dialysis through a right-sided catheter.  Patient has a history of diabetes which is a likely source of her renal failure in addition to hypertension.  She is left-handed.  She is on dialysis Tuesday Thursday Saturday  The patient has a history of left leg infection and is status post left below-knee amputation.  She ambulates with a prosthesis.  This is recently been giving her difficulty.  She is scheduled to get refitted in the near future       Past Medical History  Diagnosis Date  . Hypertension   . Anemia   . History of blood transfusion     "last week" (02/02/2013)  . GERD (gastroesophageal reflux disease)   . Osteomyelitis of toe of left foot ([place])     "off and on since 2009; no OR" (02/02/2013)  . Renal failure     acute vs chronic/notes 02/02/2013  . Type II diabetes mellitus ([place]) 1995  . Exertional shortness of breath     "recently; it's fluid" (02/02/2013)          Past Surgical History  Procedure Laterality Date  . Cesarean section  10/18/2006  . Amputation Left 02/06/2013    Procedure: AMPUTATION LEFT GREAT TOE;  Surgeon: Wylene Simmer, MD;  Location: [place];  Service: Orthopedics;  Laterality: Left;  . Amputation Left 06/24/2013    Procedure: AMPUTATION BELOW KNEE ;  Surgeon: Wylene Simmer, MD;  Location: [place];  Service: Orthopedics;  Laterality: Left;    Social History        Social History    . Marital Status: Single    Spouse Name: N/A  . Number of Children: N/A  . Years of Education: N/A      Occupational History  . Not on file.        Social History Main Topics  . Smoking status: Former Smoker -- 3.00 packs/day for 10 years    Types: Cigarettes    Quit date: 09/21/2004  . Smokeless tobacco: Never Used  . Alcohol Use: No  . Drug Use: No  . Sexual Activity: No       Other Topics Concern  . Not on file   Social History Narrative    No family history on file.       Allergies as of 01/20/2016 - Review Complete 01/20/2016  Allergen Reaction Noted  . Novolog [insulin aspart]  07/10/2013  . Morphine and related Rash   . Penicillins Rash           Current Outpatient Prescriptions on File Prior to Visit  Medication Sig Dispense Refill  . camphor-menthol (SARNA) lotion  Apply topically as needed for itching. 1 mL 0  . sodium bicarbonate 650 MG tablet Take 1 tablet (650 mg total) by mouth daily. 30 tablet 1  . insulin regular (NOVOLIN R,HUMULIN R) 100 units/mL injection Inject 0.02-0.1 mLs (2-10 Units total) into the skin 3 (three) times daily before meals. (Patient not taking: Reported on 01/20/2016) 10 mL 12  . torsemide (DEMADEX) 100 MG tablet Take 1 tablet (100 mg total) by mouth daily. (Patient not taking: Reported on 01/20/2016) 30 tablet 1   No current facility-administered medications on file prior to visit.     REVIEW OF SYSTEMS: Cardiovascular: Negative for chest pain.  Positive for shortness of breath with exertion and when lying flat.  Positive for leg swelling. Pulmonary: Positive for productive cough and wheezing. Neurologic: No weakness, paresthesias, aphasia, or amaurosis. No dizziness. Hematologic: No bleeding problems or clotting disorders. Musculoskeletal: No joint pain or joint swelling. Gastrointestinal: No blood in stool or hematemesis Genitourinary: No dysuria or hematuria. Psychiatric:: No history of major  depression. Integumentary: No rashes or ulcers. Constitutional: No fever or chills.  PHYSICAL EXAMINATION:      Filed Vitals:   01/20/16 1320 01/20/16 1323  BP: 176/101 184/102  Pulse: 86 87  Temp: 97.2 F (36.2 C)   TempSrc: Oral   Resp: 24   Height: 5' 5.5" (1.664 m)   Weight: 240 lb (108.863 kg)   SpO2: 98%    Body mass index is 39.32 kg/(m^2). General: The patient appears their stated age.   HEENT:  No gross abnormalities Pulmonary: Respirations are non-labored Abdomen: Soft and non-tender  Musculoskeletal: There are no major deformities.   Neurologic: No focal weakness or paresthesias are detected, Skin: There are no ulcer or rashes noted. Psychiatric: The patient has normal affect. Cardiovascular: There is a regular rate and rhythm without significant murmur appreciated.Palpable right radial pulse  Diagnostic Studies: I have reviewed her vein mapping.  She appears to have small cephalic veins bilaterally.  The right basilic vein does appear to be of adequate caliber, measuring greater than 0.3 cm throughout.   Assessment:  End-stage renal disease Plan: I discussed with the patient proceeding with a first stage right basilic vein transposition.  We discussed the risks and benefits of the procedure and the need for a second operation.  I will also evaluate her cephalic vein in the upper arm to make sure that this is not an adequate vein.  Her procedure been scheduled for Friday, May 12     V. Leia Alf, M.D. [facility] Office: [phone] Pager:  [phone]   Procedure cancelled due to hyperglycemia She is hypertensive today Will plan for 1st stage right BVT if cleared by anesthesia   WB

## 2016-05-27 ENCOUNTER — Telehealth: Payer: Self-pay | Admitting: Internal Medicine

## 2016-05-27 ENCOUNTER — Inpatient Hospital Stay (HOSPITAL_COMMUNITY)
Admission: AD | Admit: 2016-05-27 | Discharge: 2016-05-29 | DRG: 864 | Disposition: A | Discharge status: Home / Self Care | Payer: Medicare Other | Source: Other Acute Inpatient Hospital | Attending: Family Medicine | Admitting: Family Medicine

## 2016-05-27 DIAGNOSIS — I120 Hypertensive chronic kidney disease with stage 5 chronic kidney disease or end stage renal disease: Secondary | ICD-10-CM | POA: Diagnosis present

## 2016-05-27 DIAGNOSIS — I10 Essential (primary) hypertension: Secondary | ICD-10-CM | POA: Diagnosis present

## 2016-05-27 DIAGNOSIS — Z87891 Personal history of nicotine dependence: Secondary | ICD-10-CM

## 2016-05-27 DIAGNOSIS — K219 Gastro-esophageal reflux disease without esophagitis: Secondary | ICD-10-CM | POA: Diagnosis present

## 2016-05-27 DIAGNOSIS — R509 Fever, unspecified: Secondary | ICD-10-CM | POA: Diagnosis present

## 2016-05-27 DIAGNOSIS — Z8249 Family history of ischemic heart disease and other diseases of the circulatory system: Secondary | ICD-10-CM

## 2016-05-27 DIAGNOSIS — Z89519 Acquired absence of unspecified leg below knee: Secondary | ICD-10-CM

## 2016-05-27 DIAGNOSIS — Z79899 Other long term (current) drug therapy: Secondary | ICD-10-CM

## 2016-05-27 DIAGNOSIS — E1122 Type 2 diabetes mellitus with diabetic chronic kidney disease: Secondary | ICD-10-CM | POA: Diagnosis present

## 2016-05-27 DIAGNOSIS — Z794 Long term (current) use of insulin: Secondary | ICD-10-CM

## 2016-05-27 DIAGNOSIS — E8889 Other specified metabolic disorders: Secondary | ICD-10-CM | POA: Diagnosis present

## 2016-05-27 DIAGNOSIS — Z89512 Acquired absence of left leg below knee: Secondary | ICD-10-CM

## 2016-05-27 DIAGNOSIS — D649 Anemia, unspecified: Secondary | ICD-10-CM | POA: Diagnosis present

## 2016-05-27 DIAGNOSIS — Z833 Family history of diabetes mellitus: Secondary | ICD-10-CM

## 2016-05-27 DIAGNOSIS — N2581 Secondary hyperparathyroidism of renal origin: Secondary | ICD-10-CM | POA: Diagnosis present

## 2016-05-27 DIAGNOSIS — I255 Ischemic cardiomyopathy: Secondary | ICD-10-CM | POA: Diagnosis present

## 2016-05-27 DIAGNOSIS — N186 End stage renal disease: Secondary | ICD-10-CM

## 2016-05-27 DIAGNOSIS — Z992 Dependence on renal dialysis: Secondary | ICD-10-CM

## 2016-05-27 NOTE — Telephone Encounter (Signed)
Called by [facility] hospital: 40 yo F with DM, ESRD, who presents to ED with fever.  Dialysis access has been in since April.  That is the suspected source per EDP due to lack of other sources.  Has central line in femoral.  BP [id number] systolic was over [id number] initially.  Dialysis normally TTS.  Got vanc and cefepime.  HR 84.  WBC 10.  Lactic acid 1.3.  Patient going to med-surg.

## 2016-05-28 ENCOUNTER — Encounter (HOSPITAL_COMMUNITY): Payer: Self-pay | Admitting: *Deleted

## 2016-05-28 DIAGNOSIS — Z833 Family history of diabetes mellitus: Secondary | ICD-10-CM | POA: Diagnosis not present

## 2016-05-28 DIAGNOSIS — N2581 Secondary hyperparathyroidism of renal origin: Secondary | ICD-10-CM | POA: Diagnosis present

## 2016-05-28 DIAGNOSIS — R509 Fever, unspecified: Secondary | ICD-10-CM

## 2016-05-28 DIAGNOSIS — Z8249 Family history of ischemic heart disease and other diseases of the circulatory system: Secondary | ICD-10-CM | POA: Diagnosis not present

## 2016-05-28 DIAGNOSIS — K219 Gastro-esophageal reflux disease without esophagitis: Secondary | ICD-10-CM | POA: Diagnosis present

## 2016-05-28 DIAGNOSIS — I255 Ischemic cardiomyopathy: Secondary | ICD-10-CM | POA: Diagnosis present

## 2016-05-28 DIAGNOSIS — E8889 Other specified metabolic disorders: Secondary | ICD-10-CM | POA: Diagnosis present

## 2016-05-28 DIAGNOSIS — Z794 Long term (current) use of insulin: Secondary | ICD-10-CM | POA: Diagnosis not present

## 2016-05-28 DIAGNOSIS — I10 Essential (primary) hypertension: Secondary | ICD-10-CM | POA: Diagnosis not present

## 2016-05-28 DIAGNOSIS — I120 Hypertensive chronic kidney disease with stage 5 chronic kidney disease or end stage renal disease: Secondary | ICD-10-CM | POA: Diagnosis present

## 2016-05-28 DIAGNOSIS — E1100 Type 2 diabetes mellitus with hyperosmolarity without nonketotic hyperglycemic-hyperosmolar coma (NKHHC): Secondary | ICD-10-CM

## 2016-05-28 DIAGNOSIS — Z87891 Personal history of nicotine dependence: Secondary | ICD-10-CM | POA: Diagnosis not present

## 2016-05-28 DIAGNOSIS — Z89512 Acquired absence of left leg below knee: Secondary | ICD-10-CM

## 2016-05-28 DIAGNOSIS — N186 End stage renal disease: Secondary | ICD-10-CM | POA: Diagnosis present

## 2016-05-28 DIAGNOSIS — E1122 Type 2 diabetes mellitus with diabetic chronic kidney disease: Secondary | ICD-10-CM | POA: Diagnosis present

## 2016-05-28 DIAGNOSIS — Z992 Dependence on renal dialysis: Secondary | ICD-10-CM | POA: Diagnosis not present

## 2016-05-28 DIAGNOSIS — Z79899 Other long term (current) drug therapy: Secondary | ICD-10-CM | POA: Diagnosis not present

## 2016-05-28 DIAGNOSIS — D649 Anemia, unspecified: Secondary | ICD-10-CM | POA: Diagnosis present

## 2016-05-28 LAB — BASIC METABOLIC PANEL
Anion gap: 9 (ref 5–15)
BUN: 42 mg/dL — AB (ref 6–20)
CALCIUM: 7.1 mg/dL — AB (ref 8.9–10.3)
CHLORIDE: 101 mmol/L (ref 101–111)
CO2: 18 mmol/L — ABNORMAL LOW (ref 22–32)
CREATININE: 8.31 mg/dL — AB (ref 0.44–1.00)
GFR, EST AFRICAN AMERICAN: 6 mL/min — AB (ref >60)
GFR, EST NON AFRICAN AMERICAN: 5 mL/min — AB (ref >60)
Glucose, Bld: 204 mg/dL — ABNORMAL HIGH (ref 65–99)
Potassium: 5.4 mmol/L — ABNORMAL HIGH (ref 3.5–5.1)
SODIUM: 128 mmol/L — AB (ref 135–145)

## 2016-05-28 LAB — GLUCOSE, CAPILLARY
GLUCOSE-CAPILLARY: 151 mg/dL — AB (ref 65–99)
GLUCOSE-CAPILLARY: 115 mg/dL — AB (ref 65–99)
GLUCOSE-CAPILLARY: 91 mg/dL (ref 65–99)
Glucose-Capillary: 162 mg/dL — ABNORMAL HIGH (ref 65–99)
Glucose-Capillary: 207 mg/dL — ABNORMAL HIGH (ref 65–99)

## 2016-05-28 LAB — CBC WITH DIFFERENTIAL/PLATELET
BASOS PCT: 0 %
Basophils Absolute: 0 10*3/uL (ref 0.0–0.1)
EOS ABS: 0.1 10*3/uL (ref 0.0–0.7)
EOS PCT: 0 %
HCT: 32.6 % — ABNORMAL LOW (ref 36.0–46.0)
HEMOGLOBIN: 9.7 g/dL — AB (ref 12.0–15.0)
LYMPHS ABS: 1.6 10*3/uL (ref 0.7–4.0)
Lymphocytes Relative: 13 %
MCH: 24.9 pg — AB (ref 26.0–34.0)
MCHC: 29.8 g/dL — ABNORMAL LOW (ref 30.0–36.0)
MCV: 83.8 fL (ref 78.0–100.0)
Monocytes Absolute: 1.1 10*3/uL — ABNORMAL HIGH (ref 0.1–1.0)
Monocytes Relative: 8 %
NEUTROS PCT: 79 %
Neutro Abs: 9.9 10*3/uL — ABNORMAL HIGH (ref 1.7–7.7)
PLATELETS: 175 10*3/uL (ref 150–400)
RBC: 3.89 MIL/uL (ref 3.87–5.11)
RDW: 16.7 % — ABNORMAL HIGH (ref 11.5–15.5)
WBC: 12.6 10*3/uL — AB (ref 4.0–10.5)

## 2016-05-28 LAB — MRSA PCR SCREENING: MRSA BY PCR: NEGATIVE

## 2016-05-28 MED ORDER — DEXTROSE 5 % IV SOLN
1.0000 g | INTRAVENOUS | Status: DC
  Filled 2016-05-28: qty 1

## 2016-05-28 MED ORDER — VANCOMYCIN HCL IN DEXTROSE 1-5 GM/200ML-% IV SOLN
1000.0000 mg | INTRAVENOUS | Status: DC
  Administered 2016-05-28: 1000 mg via INTRAVENOUS
  Filled 2016-05-28: qty 200

## 2016-05-28 MED ORDER — ACETAMINOPHEN 325 MG PO TABS: 650.0000 mg | ORAL_TABLET | Freq: Four times a day (QID) | ORAL | Status: DC | PRN

## 2016-05-28 MED ORDER — HEPARIN SODIUM (PORCINE) 1000 UNIT/ML DIALYSIS
6000.0000 [IU] | Freq: Once | INTRAMUSCULAR | Status: DC
  Filled 2016-05-28: qty 6

## 2016-05-28 MED ORDER — DARBEPOETIN ALFA 150 MCG/0.3ML IJ SOSY
150.0000 ug | PREFILLED_SYRINGE | INTRAMUSCULAR | Status: DC
  Administered 2016-05-28: 150 ug via INTRAVENOUS

## 2016-05-28 MED ORDER — CARVEDILOL 25 MG PO TABS
25.0000 mg | ORAL_TABLET | Freq: Two times a day (BID) | ORAL | Status: DC
Start: 2016-05-28 — End: 2016-05-29
  Administered 2016-05-28 – 2016-05-29 (×2): 25 mg via ORAL
  Filled 2016-05-28 (×3): qty 1

## 2016-05-28 MED ORDER — LIDOCAINE HCL (PF) 1 % IJ SOLN: 5.0000 mL | INTRAMUSCULAR | Status: DC | PRN

## 2016-05-28 MED ORDER — HEPARIN SODIUM (PORCINE) 1000 UNIT/ML DIALYSIS: 1000.0000 [IU] | INTRAMUSCULAR | Status: DC | PRN

## 2016-05-28 MED ORDER — ALTEPLASE 2 MG IJ SOLR
INTRAMUSCULAR | Status: AC
  Administered 2016-05-28: 17:00:00
  Filled 2016-05-28: qty 4

## 2016-05-28 MED ORDER — OXYCODONE HCL 5 MG PO TABS
5.0000 mg | ORAL_TABLET | ORAL | Status: DC | PRN
  Administered 2016-05-28: 5 mg via ORAL
  Filled 2016-05-28: qty 1

## 2016-05-28 MED ORDER — INSULIN LISPRO 100 UNIT/ML ~~LOC~~ SOLN
0.0000 [IU] | Freq: Three times a day (TID) | SUBCUTANEOUS | Status: DC
  Administered 2016-05-29: 5 [IU] via SUBCUTANEOUS

## 2016-05-28 MED ORDER — INSULIN LISPRO 100 UNIT/ML ~~LOC~~ SOLN
0.0000 [IU] | Freq: Every day | SUBCUTANEOUS | Status: DC
  Filled 2016-05-28: qty 10

## 2016-05-28 MED ORDER — AMLODIPINE BESYLATE 5 MG PO TABS
5.0000 mg | ORAL_TABLET | Freq: Every day | ORAL | Status: DC
  Administered 2016-05-29: 5 mg via ORAL
  Filled 2016-05-28: qty 1

## 2016-05-28 MED ORDER — ACETAMINOPHEN 650 MG RE SUPP: 650.0000 mg | Freq: Four times a day (QID) | RECTAL | Status: DC | PRN

## 2016-05-28 MED ORDER — HEPARIN SODIUM (PORCINE) 5000 UNIT/ML IJ SOLN
5000.0000 [IU] | Freq: Three times a day (TID) | INTRAMUSCULAR | Status: DC
  Filled 2016-05-28 (×2): qty 1

## 2016-05-28 MED ORDER — DEXTROSE 5 % IV SOLN
2.0000 g | INTRAVENOUS | Status: DC
  Administered 2016-05-28: 2 g via INTRAVENOUS
  Filled 2016-05-28: qty 2

## 2016-05-28 MED ORDER — INSULIN GLARGINE 100 UNIT/ML ~~LOC~~ SOLN
10.0000 [IU] | Freq: Every day | SUBCUTANEOUS | Status: DC
  Administered 2016-05-28 (×2): 10 [IU] via SUBCUTANEOUS
  Filled 2016-05-28 (×3): qty 0.1

## 2016-05-28 MED ORDER — ONDANSETRON HCL 4 MG PO TABS: 4.0000 mg | ORAL_TABLET | Freq: Four times a day (QID) | ORAL | Status: DC | PRN

## 2016-05-28 MED ORDER — INSULIN LISPRO 100 UNIT/ML (KWIKPEN): 0.0000 [IU] | PEN_INJECTOR | Freq: Three times a day (TID) | SUBCUTANEOUS | Status: DC

## 2016-05-28 MED ORDER — CALCITRIOL 0.25 MCG PO CAPS: 0.2500 ug | ORAL_CAPSULE | ORAL | Status: DC

## 2016-05-28 MED ORDER — VANCOMYCIN HCL IN DEXTROSE 1-5 GM/200ML-% IV SOLN
INTRAVENOUS | Status: AC
  Administered 2016-05-28: 1000 mg via INTRAVENOUS
  Filled 2016-05-28: qty 200

## 2016-05-28 MED ORDER — LIDOCAINE-PRILOCAINE 2.5-2.5 % EX CREA: 1.0000 "application " | TOPICAL_CREAM | CUTANEOUS | Status: DC | PRN

## 2016-05-28 MED ORDER — SODIUM CHLORIDE 0.9 % IV SOLN: 100.0000 mL | INTRAVENOUS | Status: DC | PRN

## 2016-05-28 MED ORDER — ALPRAZOLAM 0.5 MG PO TABS
1.0000 mg | ORAL_TABLET | Freq: Three times a day (TID) | ORAL | Status: DC | PRN
  Filled 2016-05-28: qty 2

## 2016-05-28 MED ORDER — HYDRALAZINE HCL 20 MG/ML IJ SOLN: 20.0000 mg | Freq: Four times a day (QID) | INTRAMUSCULAR | Status: DC | PRN

## 2016-05-28 MED ORDER — DARBEPOETIN ALFA 150 MCG/0.3ML IJ SOSY
PREFILLED_SYRINGE | INTRAMUSCULAR | Status: AC
  Administered 2016-05-28: 150 ug via INTRAVENOUS
  Filled 2016-05-28: qty 0.3

## 2016-05-28 MED ORDER — PENTAFLUOROPROP-TETRAFLUOROETH EX AERO: 1.0000 "application " | INHALATION_SPRAY | CUTANEOUS | Status: DC | PRN

## 2016-05-28 MED ORDER — CALCIUM CARBONATE ANTACID 500 MG PO CHEW
200.0000 mg | CHEWABLE_TABLET | Freq: Three times a day (TID) | ORAL | Status: DC
  Administered 2016-05-28 – 2016-05-29 (×2): 200 mg via ORAL
  Filled 2016-05-28 (×3): qty 1

## 2016-05-28 MED ORDER — INSULIN LISPRO 100 UNIT/ML (KWIKPEN): 0.0000 [IU] | PEN_INJECTOR | Freq: Every day | SUBCUTANEOUS | Status: DC

## 2016-05-28 MED ORDER — SODIUM CHLORIDE 0.9% FLUSH
10.0000 mL | INTRAVENOUS | Status: DC | PRN
  Administered 2016-05-28 – 2016-05-29 (×2): 20 mL
  Filled 2016-05-28 (×2): qty 40

## 2016-05-28 MED ORDER — ONDANSETRON HCL 4 MG/2ML IJ SOLN: 4.0000 mg | Freq: Four times a day (QID) | INTRAMUSCULAR | Status: DC | PRN

## 2016-05-28 MED ORDER — ALTEPLASE 2 MG IJ SOLR: 2.0000 mg | Freq: Once | INTRAMUSCULAR | Status: DC | PRN

## 2016-05-28 NOTE — Progress Notes (Signed)
Subjective: Patient admitted this morning, see detailed H&P by Dr Eulas Post 40 y.o. woman with a history of ESRD on HD T/T/S through a right subclavian catheter since April 2017, HTN, DM, and Left BKA who presented to the ED at [facility] for evaluation of fever and chills.  She reports acute onset of symptoms on the day of presentation.  No recent issues with the functioning of her dialysis access as an outpatient.  She denies significant nausea or vomiting.  No light-headedness or syncope.  No falls or trauma.  No new rashes, ulcers, or skin break down.  No cough, congestion, or upper respiratory symptoms.  She still makes urine; she denies lower urinary tract symptoms.  No diarrhea.   Vitals:   05/28/16 0433 05/28/16 1000  BP: 120/75 111/65  Pulse: 89 78  Resp: 20 20  Temp: (!) 100.5 F (38.1 C) 99.2 F (37.3 C)    Chest: Clear Bilaterally Heart : S1S2 RRR Abdomen: Soft, nontender Ext : No edema Neuro: Alert, oriented x 3  A/P Fever ESRD Hypertension Diabetes Mellitus  Follow blood culture results    Oswald Hillock [facility] [phone]

## 2016-05-28 NOTE — Progress Notes (Signed)
Pharmacy Antibiotic Note  Karen [name] is a 40 y.o. female presented to [facility] on 05/27/2016 with fever/chills - suspected line infection. Pt received 2gm Cefepime and 2gm Vancomycin at [facility] 9/6 ~2100. Pt transferred to [facility] for further care. Pharmacy has been consulted for Cefepime and Vancomycin dosing.  Pt with ESRD - usually gets HD on T/T/S as o/p  Labs at [place]: WBC 10.7, SCr 8.4, BUN 44, Na 130, K 4.9  Plan: Cefepime 1gm IV q24h Vancomycin 1000gm IV with HD Will f/u micro data, HD schedule and tolerance, and pt's clinical condition Vanc pre-HD level prn  Height: 5\' 6"  (167.6 cm) Weight: 228 lb (103.4 kg) IBW/kg (Calculated) : 59.3  Temp (24hrs), Avg:99.4 F (37.4 C), Min:99.4 F (37.4 C), Max:99.4 F (37.4 C)  No results for input(s): WBC, CREATININE, LATICACIDVEN, VANCOTROUGH, VANCOPEAK, VANCORANDOM, GENTTROUGH, GENTPEAK, GENTRANDOM, TOBRATROUGH, TOBRAPEAK, TOBRARND, AMIKACINPEAK, AMIKACINTROU, AMIKACIN in the last 168 hours.  CrCl cannot be calculated (Patient's most recent lab result is older than the maximum 21 days allowed.).    Allergies  Allergen Reactions  . Morphine And Related Hives and Rash  . Novolog [Insulin Aspart] Other (See Comments)    Cramps/ Gi distress  . Penicillins Hives and Rash    Antimicrobials this admission: 9/6 Vanc >>  9/6 Cefepime >>   Dose adjustments this admission: n/a  Microbiology results:  BCx x2:  9/7 MRSA PCR:   Thank you for allowing pharmacy to be a part of this patient's care.  Sherlon Handing, PharmD, BCPS Clinical pharmacist, pager [phone] 05/28/2016 1:06 AM

## 2016-05-28 NOTE — Procedures (Signed)
  I was present at this dialysis session, have reviewed the session itself and made  appropriate changes Kelly Splinter MD [place] pager [phone]    cell [phone] 05/28/2016, 4:08 PM

## 2016-05-28 NOTE — Progress Notes (Signed)
Cath Flo aspirated with blood return in both ports.  Flushed with saline and heparin locked per dwell indicated on port.  RN notified.

## 2016-05-28 NOTE — Progress Notes (Signed)
Pharmacy Antibiotic Note  Karen [name] is a 40 y.o. female presented to [facility] on 05/27/2016 with fever/chills - suspected line infection. Pt received 2gm Cefepime and 2gm Vancomycin at [facility] 9/6 ~2100. Pt transferred to [facility] for further care. Pharmacy has been consulted for Cefepime and Vancomycin dosing.  ESRD on HD TTSat- note patient signs off early frequently and missed last 2 sessions PTA. In HD now.   Plan: -Cefepime 2g IV qTTSat @ 1800 (after HD) -Vancomycin 1000g IV qHD-TTSat -F/u micro data, HD schedule and tolerance, and pt's clinical condition -Vanc pre-HD level prn  Height: 5\' 6"  (167.6 cm) Weight: 228 lb (103.4 kg) IBW/kg (Calculated) : 59.3  Temp (24hrs), Avg:99.7 F (37.6 C), Min:99.2 F (37.3 C), Max:100.5 F (38.1 C)   Recent Labs Lab 05/28/16 0120  WBC 12.6*  CREATININE 8.31*    Estimated Creatinine Clearance: 11 mL/min (by C-G formula based on SCr of 8.31 mg/dL).    Allergies  Allergen Reactions  . Morphine And Related Hives and Rash  . Novolog [Insulin Aspart] Other (See Comments)    Cramps/ Gi distress  . Penicillins Hives and Rash    Antimicrobials this admission: Vanc 9/6 >>  Cefepime 9/6>>   Dose adjustments this admission: 9/7: cefepime changed to 2g IV qHD-TTSat @ 1800  Microbiology results: 9/6 BCx (from HD cath):  9/7 MRSA PCR:  9/7 BCx (from peripheral line):   Thank you for allowing pharmacy to be a part of this patient's care.  Yuko Coventry D. Linsay Vogt, PharmD, BCPS Clinical Pharmacist Pager: [phone] 05/28/2016 11:58 AM

## 2016-05-28 NOTE — Progress Notes (Signed)
New Admission Note:  Arrival Method: ambulance stretcher Mental Orientation: Alert and oriented x 4  Telemetry: N/A Assessment: Completed Skin: warm and dry IV: TLC RT groin Pain: Headache 10/10  Tubes: N/A Safety Measures: Safety Fall Prevention Plan was given, discussed and signed. Admission: Completed [address] [place] Orientation: Patient has been orientated to the room, unit and the staff. Family: None   Orders have been reviewed and implemented. Will continue to monitor the patient. Call light has been placed within reach and bed alarm has been activated.   Sima Matas BSN, RN  Phone Number: [phone]

## 2016-05-28 NOTE — H&P (Signed)
History and Physical    Karen [name] NOB:[id number] DOB: 15-Aug-1976 DOA: 05/27/2016  PCP: Harvie Junior, MD  Nephrologist: Dr. Justin Mend  Patient coming from: [facility]  Chief Complaint: Fever and chills  HPI: Karen [name] is a 40 y.o. woman with a history of ESRD on HD T/T/S through a right subclavian catheter since April 2017, HTN, DM, and Left BKA who presented to the ED at [facility] for evaluation of fever and chills.  She reports acute onset of symptoms on the day of presentation.  No recent issues with the functioning of her dialysis access as an outpatient.  She denies significant nausea or vomiting.  No light-headedness or syncope.  No falls or trauma.  No new rashes, ulcers, or skin break down.  No cough, congestion, or upper respiratory symptoms.  She still makes urine; she denies lower urinary tract symptoms.  No diarrhea.  ED Course (at [facility]): Chest xray showed cardiomegaly but no acute process.  EKG showed sinus tachycardia (HR 101) but no acute ST segment changes.  T max there 100.5 (but patient reportedly took acetaminophen prior to presentation).  WBC count 10.  Lactic acid 1.3.  Sodium 130. Bicarb 20.  BUN 44.  Cr 8.7.  Glucose 170.  INR 1.  U/A was not consistent with infection.  Dialysis catheter suspected as source of infection.  The patient received IV vancomycin and cefepime.  She also received IV dilaudid for pain and clonidine 0.2mg  po one time for accelerated hypertension.  A right femoral central venous catheter was placed for IV access.  She has been transferred to [facility] for further evaluation and treatment.  Review of Systems: As per HPI otherwise 10 point review of systems negative.    Past Medical History:  Diagnosis Date  . Anemia   . Anxiety   . Exertional shortness of breath    "recently; it's fluid" (02/02/2013)  . GERD (gastroesophageal reflux disease)   . History of blood transfusion    "last week" (02/02/2013)  . Hypertension   . Ischemic  cardiomyopathy    by echo 2014  . Osteomyelitis of toe of left foot ([place])    "off and on since 2009; no OR" (02/02/2013)  . Renal failure    acute vs chronic/notes 02/02/2013  . Type II diabetes mellitus ([place]) 1995    Past Surgical History:  Procedure Laterality Date  . AMPUTATION Left 02/06/2013   Procedure: AMPUTATION LEFT GREAT TOE;  Surgeon: Wylene Simmer, MD;  Location: [place];  Service: Orthopedics;  Laterality: Left;  . AMPUTATION Left 06/24/2013   Procedure: AMPUTATION BELOW KNEE ;  Surgeon: Wylene Simmer, MD;  Location: [place];  Service: Orthopedics;  Laterality: Left;  . CESAREAN SECTION  10/18/2006  . INSERTION OF DIALYSIS CATHETER       reports that she quit smoking about 11 years ago. Her smoking use included Cigarettes. She has a 30.00 pack-year smoking history. She has never used smokeless tobacco. She reports that she does not drink alcohol or use drugs.  She has four children.  Allergies  Allergen Reactions  . Morphine And Related Hives and Rash  . Novolog [Insulin Aspart] Other (See Comments)    Cramps/ Gi distress  . Penicillins Hives and Rash    FAMILY HISTORY: Father is deceased.  He had a history of HTN, MI. Mother has diabetes.  Prior to Admission medications   Medication Sig Start Date End Date Taking? Authorizing Provider  ALPRAZolam Duanne Moron) 1 MG tablet Take  1 mg by mouth 3 (three) times daily as needed for anxiety.     Historical Provider, MD  amLODipine (NORVASC) 5 MG tablet Take 5 mg by mouth daily.  01/14/16   Historical Provider, MD  carvedilol (COREG) 25 MG tablet Take 25 mg by mouth 2 (two) times daily with a meal.  01/14/16   Historical Provider, MD  insulin glargine (LANTUS) 100 UNIT/ML injection Inject 18 Units into the skin at bedtime.    Historical Provider, MD  insulin lispro (HUMALOG) 100 UNIT/ML injection Inject 18 Units into the skin 2 (two) times daily.    Historical Provider, MD  sodium bicarbonate 650 MG tablet Take 1 tablet (650 mg total) by  mouth daily. Patient not taking: Reported on 05/20/2016 07/13/13   Cathlyn Parsons, PA-C    Physical Exam: Vitals:   05/28/16 0010  BP: (!) 159/89  Pulse: 92  Resp: (!) 24  Temp: 99.4 F (37.4 C)  SpO2: 99%  Weight: 103.4 kg (228 lb)  Height: 5\' 6"  (1.676 m)      Constitutional: NAD, calm, comfortable, nontoxic appearing Vitals:   05/28/16 0010  BP: (!) 159/89  Pulse: 92  Resp: (!) 24  Temp: 99.4 F (37.4 C)  SpO2: 99%  Weight: 103.4 kg (228 lb)  Height: 5\' 6"  (1.676 m)   Eyes: PERRL, lids and conjunctivae normal ENMT: Mucous membranes are slightly dry. Posterior pharynx clear of any exudate or lesions. Normal dentition.  Neck: normal appearance, supple, no masses Respiratory: clear to auscultation bilaterally, no wheezing, no crackles. Normal respiratory effort. No accessory muscle use.  Cardiovascular: Normal rate, regular rhythm, no murmurs / rubs / gallops. No extremity edema on the right. 2+ pedal pulse on the right.    GI: abdomen is soft and compressible.  No distention.  No tenderness.  Bowel sounds are present. Musculoskeletal:  No joint deformity in upper extremities. Left BKA.  No contractures. Normal muscle tone.  Skin: Chronic appearing lesions to right leg.  No skin breakdown at Left BKA stump.   Neurologic: No focal deficits. Psychiatric: sleeping but arousable.  Normal mood.     Pertinent labs and imaging from the outside hospital are noted in the HPI.  CBG:  Recent Labs Lab 05/28/16 0014  GLUCAP 207*     Assessment/Plan Principal Problem:   Fever, unspecified Active Problems:   Essential hypertension   S/P BKA (below knee amputation) unilateral (HCC)   Type 2 diabetes mellitus with hyperosmolarity without nonketotic hyperglycemic-hyperosmolar coma (nkhhc) (HCC)   End-stage renal disease on hemodialysis (HCC)   Fever      Fever, without evidence of sepsis.  Source unclear but line infection suspected. --IV vanc and cefepime per  pharmacy --Blood cultures (peripheral) now.  Will likely need culture drawn from HD catheter with next HD session. --Acetaminophen prn for Temp greater than 100.4  ESRD on HD T/T/S --Will need nephrology consult in the AM for HD orders  HTN --Continue home doses of coreg and norvasc --IV hydralazine prn  DM --Reduced long acting insulin dose for now --Humalog SSI due to novolog intolerance --Check A1c --Carb controlled, renal diet with 1500cc fluid restriction      DVT prophylaxis: SubQ Heparin  Code Status: FULL Family Communication: Patient alone at time of admission Disposition Plan: To be determined Consults called: NONE.  Nephrology will need to be paged in the AM Admission status: Observation, med surg   TIME SPENT: 67 minutes   Eber Jones MD [facility] Pager [phone]  If 7PM-7AM, please contact night-coverage [email or web address] Password [email or web address]  05/28/2016, 12:54 AM

## 2016-05-28 NOTE — Consult Note (Signed)
[facility] Renal Consultation Note    Indication for Consultation:  Management of ESRD/hemodialysis; anemia, hypertension/volume and secondary hyperparathyroidism   HPI: Karen [name] is a 40 y.o. female with ESRD on hemodialysis since April 2017. She receives HD at [facility] on TTS schedule. Her medical history is also significant for IDDM, HTN, obesity, chronic osteo of L great toe, requiring amputation, s/p L BKA 2014.   She presented to [facility] yesterday with fever, chills, and nausea. She was given Vanc/cefepime dose then transferred to [facility]. On arrival Temp 100.24F RR 20 BP 120/75 Labs: WBC 12.6 Hgb 9.7 K 5.4. Routine blood cultures x 2 collected.   Her last outpatient HD session was on 9/5 for 2 hours and left 0.8 kg over her EDW.  She missed 2 sessions prior. She is not compliant with HD sessions with frequent missed and shortened sessions and has been counseled regarding this. Currently using R jugular TDC for access and does not have a permanent access site. She was last seen by [facility]VS for access placement on 8/30 - procedure cancelled d/t hyperglycemia. Per patient she has not rescheduled.   Today she is seen at bedside and reports feeling weak but denies chills, shortness of breath, chest pain, abdominal pain N/V/D.   Past Medical History:  Diagnosis Date  . Anemia   . Anxiety   . Exertional shortness of breath    "recently; it's fluid" (02/02/2013)  . GERD (gastroesophageal reflux disease)   . History of blood transfusion    "last week" (02/02/2013)  . Hypertension   . Ischemic cardiomyopathy    by echo 2014  . Osteomyelitis of toe of left foot ([place])    "off and on since 2009; no OR" (02/02/2013)  . Renal failure    acute vs chronic/notes 02/02/2013  . Type II diabetes mellitus ([place]) 1995   Past Surgical History:  Procedure Laterality Date  . AMPUTATION Left 02/06/2013   Procedure: AMPUTATION LEFT GREAT TOE;  Surgeon: Wylene Simmer, MD;   Location: [place];  Service: Orthopedics;  Laterality: Left;  . AMPUTATION Left 06/24/2013   Procedure: AMPUTATION BELOW KNEE ;  Surgeon: Wylene Simmer, MD;  Location: [place];  Service: Orthopedics;  Laterality: Left;  . CESAREAN SECTION  10/18/2006  . INSERTION OF DIALYSIS CATHETER     History reviewed. No pertinent family history. Social History:  reports that she quit smoking about 11 years ago. Her smoking use included Cigarettes. She has a 30.00 pack-year smoking history. She has never used smokeless tobacco. She reports that she does not drink alcohol or use drugs. Allergies  Allergen Reactions  . Morphine And Related Hives and Rash  . Novolog [Insulin Aspart] Other (See Comments)    Cramps/ Gi distress  . Penicillins Hives and Rash   Prior to Admission medications   Medication Sig Start Date End Date Taking? Authorizing Provider  ALPRAZolam Duanne Moron) 1 MG tablet Take 1 mg by mouth 3 (three) times daily as needed for anxiety.     Historical Provider, MD  amLODipine (NORVASC) 5 MG tablet Take 5 mg by mouth daily.  01/14/16   Historical Provider, MD  carvedilol (COREG) 25 MG tablet Take 25 mg by mouth 2 (two) times daily with a meal.  01/14/16   Historical Provider, MD  insulin glargine (LANTUS) 100 UNIT/ML injection Inject 18 Units into the skin at bedtime.    Historical Provider, MD  insulin lispro (HUMALOG) 100 UNIT/ML injection Inject 18 Units into the skin  2 (two) times daily.    Historical Provider, MD  sodium bicarbonate 650 MG tablet Take 1 tablet (650 mg total) by mouth daily. Patient not taking: Reported on 05/20/2016 07/13/13   Lavon Paganini Angiulli, PA-C   Current Facility-Administered Medications  Medication Dose Route Frequency Provider Last Rate Last Dose  . acetaminophen (TYLENOL) tablet 650 mg  650 mg Oral Q6H PRN Lily Kocher, MD       Or  . acetaminophen (TYLENOL) suppository 650 mg  650 mg Rectal Q6H PRN Lily Kocher, MD      . ALPRAZolam Duanne Moron) tablet 1 mg  1 mg Oral TID PRN  Lily Kocher, MD      . amLODipine (NORVASC) tablet 5 mg  5 mg Oral Daily Lily Kocher, MD      . carvedilol (COREG) tablet 25 mg  25 mg Oral BID WC Lily Kocher, MD      . ceFEPIme (MAXIPIME) 1 g in dextrose 5 % 50 mL IVPB  1 g Intravenous Q24H Franky Macho, RPH      . heparin injection 5,000 Units  5,000 Units Subcutaneous Q8H Lily Kocher, MD      . hydrALAZINE (APRESOLINE) injection 20 mg  20 mg Intravenous Q6H PRN Lily Kocher, MD      . insulin glargine (LANTUS) injection 10 Units  10 Units Subcutaneous QHS Lily Kocher, MD   10 Units at 05/28/16 0117  . insulin lispro (HUMALOG) injection 0-5 Units  0-5 Units Subcutaneous QHS Oswald Hillock, MD      . insulin lispro (HUMALOG) injection 0-9 Units  0-9 Units Subcutaneous TID WC Oswald Hillock, MD      . ondansetron [facility]) tablet 4 mg  4 mg Oral Q6H PRN Lily Kocher, MD       Or  . ondansetron [facility]) injection 4 mg  4 mg Intravenous Q6H PRN Lily Kocher, MD      . oxyCODONE (Oxy IR/ROXICODONE) immediate release tablet 5 mg  5 mg Oral Q4H PRN Lily Kocher, MD   5 mg at 05/28/16 0203  . sodium chloride flush (NS) 0.9 % injection 10-40 mL  10-40 mL Intracatheter PRN Lily Kocher, MD      . vancomycin (VANCOCIN) IVPB 1000 mg/200 mL premix  1,000 mg Intravenous Q T,Th,Sa-HD Franky Macho, [facility]       Labs: Basic Metabolic Panel:  Recent Labs Lab 05/28/16 0120  NA 128*  K 5.4*  CL 101  CO2 18*  GLUCOSE 204*  BUN 42*  CREATININE 8.31*  CALCIUM 7.1*   Liver Function Tests: No results for input(s): AST, ALT, ALKPHOS, BILITOT, PROT, ALBUMIN in the last 168 hours. No results for input(s): LIPASE, AMYLASE in the last 168 hours. No results for input(s): AMMONIA in the last 168 hours. CBC:  Recent Labs Lab 05/28/16 0120  WBC 12.6*  NEUTROABS 9.9*  HGB 9.7*  HCT 32.6*  MCV 83.8  PLT 175   Cardiac Enzymes: No results for input(s): CKTOTAL, CKMB, CKMBINDEX, TROPONINI in the last 168 hours. CBG:  Recent Labs Lab 05/28/16 0014  05/28/16 0729  GLUCAP 207* 151*   Iron Studies: No results for input(s): IRON, TIBC, TRANSFERRIN, FERRITIN in the last 72 hours. Studies/Results: No results found.  ROS: As per HPI otherwise negative.  Physical Exam: Vitals:   05/28/16 0010 05/28/16 0104 05/28/16 0433 05/28/16 1000  BP: (!) 159/89  120/75 111/65  Pulse: 92  89 78  Resp: (!) 24  20 20   Temp: 99.4 F (  37.4 C)  (!) 100.5 F (38.1 C) 99.2 F (37.3 C)  TempSrc:    Oral  SpO2: 99%  98% 98%  Weight: 103.4 kg (228 lb) 103.4 kg (228 lb)    Height: 5\' 6"  (1.676 m) 5\' 6"  (1.676 m)       General: WDWN NAD female NAD  Head: NCAT sclera not icteric MMM Neck: Supple.  Lungs: CTA bilaterally without wheezes, rales, or rhonchi. Breathing is unlabored. Heart: RRR with S1 S2.  Abdomen: soft NT + BS Lower extremities: L BKA site - no erythema or drainage - no LE edema  Neuro: A & O  X 3. Moves all extremities spontaneously. Psych:  Responds to questions appropriately with a normal affect. Dialysis Access: RIJ TDC with dressing no drainage / erythema/ fluctuance   Dialysis Orders: [place] TTS  4hrs  180F 400/800 2K/2.25Ca EDW 103 kg  RIJ TDC Heparin 6000 U Bolus 2000 Intermittent Aranesp 160 mcg IV q week (last 8/29) Venofer 100 mg IV q treatment  x10  (last 9/5 2/10 doses) Calcitriol 0.25 mcg PO q treatment  OP Labs: Hgb 9.9 Tsat 22% K 5.6 Ca Corr 8.7 Phos 5.9 iPTH 455  Assessment/Plan: 1. Fever -  w/u per primary Tmax 100.49F - suspect catheter infection - will collect blood cultures from cath during HD today - started on IV vanc Had blood cx's sent from [facility] prior to getting IV abx, will need to f/u the results 2. ESRD -  TTS - for HD today - poor OP compliance no perm access site  3. Hypertension - BP controlled here - usually elevated as outpatient- on amlodipine/coreg IV hydralazine prn 4. Volume  -  At EDW on admission - no gross edema on exam - plan for UF goal 1L  5. Anemia  - Hgb 9.7 - Due for ESA -  will administer during HD today  6. Metabolic bone disease -  Cont VDRA/Tums  7. Nutrition - Renal diet/fluid restriction 8. DM - per primary   Lynnda Child PA-C [place] Pager [phone] 05/28/2016, 11:14 AM   Pt seen, examined, agree w assess/plan as above with additions as indicated.  Kelly Splinter MD [employer] pager [phone]    cell [phone] 05/28/2016, 4:07 PM

## 2016-05-29 DIAGNOSIS — Z992 Dependence on renal dialysis: Secondary | ICD-10-CM

## 2016-05-29 DIAGNOSIS — N186 End stage renal disease: Secondary | ICD-10-CM

## 2016-05-29 DIAGNOSIS — I10 Essential (primary) hypertension: Secondary | ICD-10-CM

## 2016-05-29 LAB — COMPREHENSIVE METABOLIC PANEL
ALT: 10 U/L — AB (ref 14–54)
AST: 12 U/L — AB (ref 15–41)
Albumin: 2.2 g/dL — ABNORMAL LOW (ref 3.5–5.0)
Alkaline Phosphatase: 73 U/L (ref 38–126)
Anion gap: 9 (ref 5–15)
BILIRUBIN TOTAL: 0.2 mg/dL — AB (ref 0.3–1.2)
BUN: 35 mg/dL — AB (ref 6–20)
CHLORIDE: 100 mmol/L — AB (ref 101–111)
CO2: 22 mmol/L (ref 22–32)
CREATININE: 7.59 mg/dL — AB (ref 0.44–1.00)
Calcium: 7.2 mg/dL — ABNORMAL LOW (ref 8.9–10.3)
GFR, EST AFRICAN AMERICAN: 7 mL/min — AB (ref >60)
GFR, EST NON AFRICAN AMERICAN: 6 mL/min — AB (ref >60)
Glucose, Bld: 194 mg/dL — ABNORMAL HIGH (ref 65–99)
POTASSIUM: 4.5 mmol/L (ref 3.5–5.1)
Sodium: 131 mmol/L — ABNORMAL LOW (ref 135–145)
TOTAL PROTEIN: 6.1 g/dL — AB (ref 6.5–8.1)

## 2016-05-29 LAB — CBC
HEMATOCRIT: 29.8 % — AB (ref 36.0–46.0)
Hemoglobin: 8.9 g/dL — ABNORMAL LOW (ref 12.0–15.0)
MCH: 25.1 pg — AB (ref 26.0–34.0)
MCHC: 29.9 g/dL — ABNORMAL LOW (ref 30.0–36.0)
MCV: 83.9 fL (ref 78.0–100.0)
PLATELETS: 144 10*3/uL — AB (ref 150–400)
RBC: 3.55 MIL/uL — ABNORMAL LOW (ref 3.87–5.11)
RDW: 16.6 % — AB (ref 11.5–15.5)
WBC: 7.3 10*3/uL (ref 4.0–10.5)

## 2016-05-29 LAB — GLUCOSE, CAPILLARY
GLUCOSE-CAPILLARY: 175 mg/dL — AB (ref 65–99)
GLUCOSE-CAPILLARY: 265 mg/dL — AB (ref 65–99)

## 2016-05-29 LAB — HEMOGLOBIN A1C
HEMOGLOBIN A1C: 11 % — AB (ref 4.8–5.6)
MEAN PLASMA GLUCOSE: 269 mg/dL

## 2016-05-29 MED ORDER — AMLODIPINE BESYLATE 5 MG PO TABS: 5.0000 mg | ORAL_TABLET | Freq: Every day | ORAL | 2 refills | Status: DC

## 2016-05-29 NOTE — Discharge Summary (Signed)
Physician Discharge Summary  Karen [name] SNK:[id number] DOB: 01-Mar-1976 DOA: 05/27/2016  PCP: Harvie Junior, MD  Admit date: 05/27/2016 Discharge date: 05/29/2016  Time spent: 25 minutes  Recommendations for Outpatient Follow-up:  1. Follow up PCP in 2weeks   Discharge Diagnoses:  Principal Problem:   Fever, unspecified Active Problems:   Essential hypertension   S/P BKA (below knee amputation) unilateral (HCC)   Type 2 diabetes mellitus with hyperosmolarity without nonketotic hyperglycemic-hyperosmolar coma (nkhhc) (HCC)   End-stage renal disease on hemodialysis ([place])   Fever   Discharge Condition: Stable  Diet recommendation: Low salt diet  Filed Weights   05/28/16 1149 05/28/16 1544 05/28/16 2033  Weight: 102 kg (224 lb 13.9 oz) 100 kg (220 lb 7.4 oz) 99.8 kg (220 lb 0.3 oz)    History of present illness: 40 y.o. female with ESRD on hemodialysis since April 2017. She receives HD at [facility] on TTS schedule. Her medical history is also significant for IDDM, HTN, obesity, chronic osteo of L great toe, requiring amputation, s/p L BKA 2014.   She presented to [facility] yesterday with fever, chills, and nausea. She was given Vanc/cefepime dose then transferred to [facility]. On arrival Temp 100.45F RR 20 BP 120/75 Labs: WBC 12.6 Hgb 9.7 K 5.4. Routine blood cultures x 2 collected.   Her last outpatient HD session was on 9/5 for 2 hours and left 0.8 kg over her EDW.  She missed 2 sessions prior. She is not compliant with HD sessions with frequent missed and shortened sessions and has been counseled regarding this. Currently using R jugular TDC for access and does not have a permanent access site. She was last seen by VVS for access placement on 8/30 - procedure cancelled d/t hyperglycemia. Per patient she has not rescheduled.    Hospital Course:  Fever, without evidence of sepsis.   Resolved, called [facility] and obtained culture results. Blood culture  is growing 1/2 bottles of coagulase negative Staph, likely contamination. Will d/c Vancomycin and Cefepime  ESRD on HD T/T/S --Nephrology followed the patient.  HTN --Continue home doses of coreg and norvasc  DM --Continue Lantus and Humalog  Procedures:  None   Consultations:  Nephrology   Discharge Exam: Vitals:   05/28/16 2033 05/29/16 0530  BP: 139/84 134/79  Pulse: 82 75  Resp: 19 18  Temp: 98.3 F (36.8 C) 98 F (36.7 C)    General: Appears in no acute distress Cardiovascular: S1s2 RRR Respiratory: Clear bilaterally  Discharge Instructions   Discharge Instructions    Diet - low sodium heart healthy    Complete by:  As directed   Increase activity slowly    Complete by:  As directed     Current Discharge Medication List    CONTINUE these medications which have CHANGED   Details  amLODipine (NORVASC) 5 MG tablet Take 1 tablet (5 mg total) by mouth daily. Qty: 30 tablet, Refills: 2      CONTINUE these medications which have NOT CHANGED   Details  ALPRAZolam (XANAX) 1 MG tablet Take 1 mg by mouth 3 (three) times daily as needed for anxiety.     carvedilol (COREG) 25 MG tablet Take 25 mg by mouth 2 (two) times daily with a meal.     insulin glargine (LANTUS) 100 UNIT/ML injection Inject 18 Units into the skin at bedtime.    insulin lispro (HUMALOG) 100 UNIT/ML injection Inject 8-15 Units into the skin See admin instructions. Per sliding scale  Allergies  Allergen Reactions  . Morphine And Related Hives and Rash  . Novolog [Insulin Aspart] Other (See Comments)    Cramps/ Gi distress  . Penicillins Hives and Rash      The results of significant diagnostics from this hospitalization (including imaging, microbiology, ancillary and laboratory) are listed below for reference.    Significant Diagnostic Studies: No results found.  Microbiology: Recent Results (from the past 240 hour(s))  MRSA PCR Screening     Status: None   Collection  Time: 05/28/16 12:24 AM  Result Value Ref Range Status   MRSA by PCR NEGATIVE NEGATIVE Final    Comment:        The GeneXpert MRSA Assay (FDA approved for NASAL specimens only), is one component of a comprehensive MRSA colonization surveillance program. It is not intended to diagnose MRSA infection nor to guide or monitor treatment for MRSA infections.   Culture, blood (Routine X 2) w Reflex to ID Panel     Status: None (Preliminary result)   Collection Time: 05/28/16  1:45 AM  Result Value Ref Range Status   Specimen Description BLOOD RIGHT HAND  Final   Special Requests IN PEDIATRIC BOTTLE 1ML  Final   Culture NO GROWTH 1 DAY  Final   Report Status PENDING  Incomplete  Culture, blood (Routine X 2) w Reflex to ID Panel     Status: None (Preliminary result)   Collection Time: 05/28/16  1:52 AM  Result Value Ref Range Status   Specimen Description BLOOD LEFT FOREARM  Final   Special Requests IN PEDIATRIC BOTTLE 1ML  Final   Culture NO GROWTH 1 DAY  Final   Report Status PENDING  Incomplete  Culture, blood (Routine X 2) w Reflex to ID Panel     Status: None (Preliminary result)   Collection Time: 05/28/16 12:10 PM  Result Value Ref Range Status   Specimen Description BLOOD HEMODIALYSIS CATHETER  Final   Special Requests BOTTLES DRAWN AEROBIC AND ANAEROBIC  10CC  Final   Culture NO GROWTH < 24 HOURS  Final   Report Status PENDING  Incomplete  Culture, blood (Routine X 2) w Reflex to ID Panel     Status: None (Preliminary result)   Collection Time: 05/28/16 12:30 PM  Result Value Ref Range Status   Specimen Description HEMODIALYSIS CATHETER  Final   Special Requests NONE  Final   Culture NO GROWTH < 24 HOURS  Final   Report Status PENDING  Incomplete     Labs: Basic Metabolic Panel:  Recent Labs Lab 05/28/16 0120 05/29/16 0434  NA 128* 131*  K 5.4* 4.5  CL 101 100*  CO2 18* 22  GLUCOSE 204* 194*  BUN 42* 35*  CREATININE 8.31* 7.59*  CALCIUM 7.1* 7.2*   Liver  Function Tests:  Recent Labs Lab 05/29/16 0434  AST 12*  ALT 10*  ALKPHOS 73  BILITOT 0.2*  PROT 6.1*  ALBUMIN 2.2*   No results for input(s): LIPASE, AMYLASE in the last 168 hours. No results for input(s): AMMONIA in the last 168 hours. CBC:  Recent Labs Lab 05/28/16 0120 05/29/16 0434  WBC 12.6* 7.3  NEUTROABS 9.9*  --   HGB 9.7* 8.9*  HCT 32.6* 29.8*  MCV 83.8 83.9  PLT 175 144*    CBG:  Recent Labs Lab 05/28/16 0729 05/28/16 1128 05/28/16 1647 05/28/16 2025 05/29/16 0732  GLUCAP 151* 115* 91 162* 175*       Signed:  Eleonore Chiquito S MD.  Sheliah Plane  [facility] 05/29/2016, 11:02 AM

## 2016-05-29 NOTE — Progress Notes (Signed)
[facility] Progress Note   Subjective: "I want to go home today". Cultures from [place] showed 1/2 +blood cx for CNSS.  No staph aureus.   Objective Vitals:   05/28/16 1544 05/28/16 1718 05/28/16 2033 05/29/16 0530  BP: 133/75 128/73 139/84 134/79  Pulse: 72 76 82 75  Resp: 18 18 19 18   Temp: 98.2 F (36.8 C) 98.8 F (37.1 C) 98.3 F (36.8 C) 98 F (36.7 C)  TempSrc: Oral Oral Oral Oral  SpO2: 100% 98% 95% 98%  Weight: 100 kg (220 lb 7.4 oz)  99.8 kg (220 lb 0.3 oz)   Height:       Physical Exam General: Well nourished, pleasant, NAD Heart: S1,S2, RRR Lungs: BBS CTA Abdomen: soft nontender Extremities: No LE edema Skin: Nodule rash upper and lower extremities.  Dialysis Access: RIJ [facility] drsg CDI.   Dialysis Orders: [place] TTS  4hrs  180F 400/800 2K/2.25Ca EDW 103 kg  RIJ TDC Heparin 6000 U Bolus 2000 Intermittent Aranesp 160 mcg IV q week (last 8/29) Venofer 100 mg IV q treatment  x10  (last 9/5 2/10 doses) Calcitriol 0.25 mcg PO q treatment  OP Labs: Hgb 9.9 Tsat 22% K 5.6 Ca Corr 8.7 Phos 5.9 iPTH 455  Additional Objective Labs: Basic Metabolic Panel:  Recent Labs Lab 05/28/16 0120 05/29/16 0434  NA 128* 131*  K 5.4* 4.5  CL 101 100*  CO2 18* 22  GLUCOSE 204* 194*  BUN 42* 35*  CREATININE 8.31* 7.59*  CALCIUM 7.1* 7.2*   Liver Function Tests:  Recent Labs Lab 05/29/16 0434  AST 12*  ALT 10*  ALKPHOS 73  BILITOT 0.2*  PROT 6.1*  ALBUMIN 2.2*   No results for input(s): LIPASE, AMYLASE in the last 168 hours. CBC:  Recent Labs Lab 05/28/16 0120 05/29/16 0434  WBC 12.6* 7.3  NEUTROABS 9.9*  --   HGB 9.7* 8.9*  HCT 32.6* 29.8*  MCV 83.8 83.9  PLT 175 144*   Blood Culture    Component Value Date/Time   SDES HEMODIALYSIS CATHETER 05/28/2016 1230   SPECREQUEST NONE 05/28/2016 1230   CULT NO GROWTH < 24 HOURS 05/28/2016 1230   REPTSTATUS PENDING 05/28/2016 1230    Cardiac Enzymes: No results for input(s): CKTOTAL,  CKMB, CKMBINDEX, TROPONINI in the last 168 hours. CBG:  Recent Labs Lab 05/28/16 0729 05/28/16 1128 05/28/16 1647 05/28/16 2025 05/29/16 0732  GLUCAP 151* 115* 91 162* 175*   Iron Studies: No results for input(s): IRON, TIBC, TRANSFERRIN, FERRITIN in the last 72 hours. @lablastinr3 @ Studies/Results: No results found. Medications:   . amLODipine  5 mg Oral Daily  . [START ON 05/30/2016] calcitRIOL  0.25 mcg Oral Q T,Th,Sa-HD  . calcium carbonate  200 mg of elemental calcium Oral TID WC  . carvedilol  25 mg Oral BID WC  . darbepoetin (ARANESP) injection - DIALYSIS  150 mcg Intravenous Q Thu-HD  . heparin  5,000 Units Subcutaneous Q8H  . heparin  6,000 Units Dialysis Once in dialysis  . insulin glargine  10 Units Subcutaneous QHS  . insulin lispro  0-5 Units Subcutaneous QHS  . insulin lispro  0-9 Units Subcutaneous TID WC  . vancomycin  1,000 mg Intravenous Q T,Th,Sa-HD     Assessment/Plan:  1. Fever -  w/u per primary Tmax 99.2 F. All cx's from [facility] are neg, the [facility] ED blood cx's grew 1/2 + for CNSS.  Spoke w primary who has d/w ID, no need for abx, dc abx and ok  to dc home.  CNSS is contaminant.   2. ESRD -  TTS -[place]. HD 05/28/16. Nonadherent to OP HD Rx. Has missed 3 apts for permanent access.  3. Hypertension - BP controlled. on amlodipine/coreg IV hydralazine prn. HD 05/28/16 pre wt 102 kgs Net UF 1000 post wt 100 kg. Is under OP EDW 4. Volume  -  At EDW on admission - no gross edema on exam - plan for UF goal 1L  5. Anemia  - Hgb 8.9 -Rec' Aranesp 150 mcg IV 05/28/16.  6. Metabolic bone disease -  Cont VDRA/Tums  7. Nutrition - Albumin 2.2. Renal diet/fluid restriction 8. DM - per primary   Rita H. Brown NP-C 05/29/2016, 10:14 AM  [facility] [phone]  Pt seen, examined, agree w assess/plan as above with additions as indicated.  Kelly Splinter MD [employer] pager [phone]    cell [phone] 05/29/2016, 11:23  AM

## 2016-05-29 NOTE — Progress Notes (Signed)
Karen [name] to be D/C'd Home per MD order.  Discussed prescriptions and follow up appointments with the patient. Prescriptions given to patient, medication list explained in detail. Pt verbalized understanding.    Medication List    TAKE these medications   ALPRAZolam 1 MG tablet Commonly known as:  XANAX Take 1 mg by mouth 3 (three) times daily as needed for anxiety.   amLODipine 5 MG tablet Commonly known as:  NORVASC Take 1 tablet (5 mg total) by mouth daily.   carvedilol 25 MG tablet Commonly known as:  COREG Take 25 mg by mouth 2 (two) times daily with a meal.   insulin glargine 100 UNIT/ML injection Commonly known as:  LANTUS Inject 18 Units into the skin at bedtime.   insulin lispro 100 UNIT/ML injection Commonly known as:  HUMALOG Inject 8-15 Units into the skin See admin instructions. Per sliding scale       Vitals:   05/28/16 2033 05/29/16 0530  BP: 139/84 134/79  Pulse: 82 75  Resp: 19 18  Temp: 98.3 F (36.8 C) 98 F (36.7 C)    Skin clean, dry and intact without evidence of skin break down, no evidence of skin tears noted.Central line discontinued  Site without signs and symptoms of complications. Dressing and pressure applied. Pt denies pain at this time. No complaints noted.  An After Visit Summary was printed and given to the patient. Patient escorted via [place], and D/C home via private auto.  Carole Civil RN [facility] Phone [phone]

## 2016-06-02 ENCOUNTER — Other Ambulatory Visit: Payer: Self-pay

## 2016-06-02 LAB — CULTURE, BLOOD (ROUTINE X 2)
CULTURE: NO GROWTH
CULTURE: NO GROWTH
Culture: NO GROWTH
Culture: NO GROWTH

## 2016-06-09 MED ORDER — SODIUM CHLORIDE 0.9 % IV SOLN
1500.0000 mg | INTRAVENOUS | Status: AC
  Filled 2016-06-09: qty 1500

## 2016-06-10 ENCOUNTER — Encounter (HOSPITAL_COMMUNITY): Payer: Self-pay | Admitting: Certified Registered"

## 2016-06-10 ENCOUNTER — Ambulatory Visit (HOSPITAL_COMMUNITY): Admission: RE | Admit: 2016-06-10 | Payer: Medicare Other | Source: Ambulatory Visit | Admitting: Surgery

## 2016-06-10 SURGERY — TRANSPOSITION, VEIN, BASILIC
Anesthesia: Choice | Site: Arm Upper | Laterality: Right

## 2016-07-03 ENCOUNTER — Other Ambulatory Visit: Payer: Self-pay

## 2016-07-27 ENCOUNTER — Other Ambulatory Visit: Payer: Self-pay | Admitting: *Deleted

## 2016-07-31 NOTE — Progress Notes (Signed)
I spoke with patient earlier today and she reported that she is unable to have surgery on Monday.  I instructed patient to cal the office and inform them.  Patient was still on OR schedule after 1500, I called Stephanie at [facility] office and  informed her, Colletta Maryland said she would call patient.Patient is still on thwe schedule at 1755, I called patient, she said she did not call office and did not receive a call from office, but she is not having surgery on Monday.  I instructed patient to call office Monday.  I called OR desk and gave them the information.

## 2016-08-03 ENCOUNTER — Ambulatory Visit (HOSPITAL_COMMUNITY): Admission: RE | Admit: 2016-08-03 | Payer: Medicare Other | Source: Ambulatory Visit | Admitting: Surgery

## 2016-08-03 ENCOUNTER — Encounter (HOSPITAL_COMMUNITY): Admission: RE | Payer: Self-pay | Source: Ambulatory Visit

## 2016-08-03 SURGERY — TRANSPOSITION, VEIN, BASILIC
Anesthesia: Choice | Laterality: Right

## 2016-09-08 ENCOUNTER — Emergency Department (HOSPITAL_COMMUNITY): Payer: Medicare Other

## 2016-09-08 ENCOUNTER — Encounter (HOSPITAL_COMMUNITY): Payer: Self-pay | Admitting: Emergency Medicine

## 2016-09-08 ENCOUNTER — Inpatient Hospital Stay (HOSPITAL_COMMUNITY)
Admission: EM | Admit: 2016-09-08 | Discharge: 2016-09-10 | DRG: 193 | Disposition: A | Discharge status: Home / Self Care | Payer: Medicare Other | Attending: Internal Medicine | Admitting: Internal Medicine

## 2016-09-08 DIAGNOSIS — J189 Pneumonia, unspecified organism: Principal | ICD-10-CM | POA: Diagnosis present

## 2016-09-08 DIAGNOSIS — Z794 Long term (current) use of insulin: Secondary | ICD-10-CM | POA: Diagnosis not present

## 2016-09-08 DIAGNOSIS — Z89519 Acquired absence of unspecified leg below knee: Secondary | ICD-10-CM

## 2016-09-08 DIAGNOSIS — I255 Ischemic cardiomyopathy: Secondary | ICD-10-CM | POA: Diagnosis present

## 2016-09-08 DIAGNOSIS — Z89512 Acquired absence of left leg below knee: Secondary | ICD-10-CM

## 2016-09-08 DIAGNOSIS — E872 Acidosis: Secondary | ICD-10-CM | POA: Diagnosis present

## 2016-09-08 DIAGNOSIS — N186 End stage renal disease: Secondary | ICD-10-CM

## 2016-09-08 DIAGNOSIS — J181 Lobar pneumonia, unspecified organism: Secondary | ICD-10-CM | POA: Diagnosis not present

## 2016-09-08 DIAGNOSIS — N2581 Secondary hyperparathyroidism of renal origin: Secondary | ICD-10-CM | POA: Diagnosis present

## 2016-09-08 DIAGNOSIS — I132 Hypertensive heart and chronic kidney disease with heart failure and with stage 5 chronic kidney disease, or end stage renal disease: Secondary | ICD-10-CM | POA: Diagnosis present

## 2016-09-08 DIAGNOSIS — E1165 Type 2 diabetes mellitus with hyperglycemia: Secondary | ICD-10-CM | POA: Diagnosis present

## 2016-09-08 DIAGNOSIS — Z8249 Family history of ischemic heart disease and other diseases of the circulatory system: Secondary | ICD-10-CM

## 2016-09-08 DIAGNOSIS — D631 Anemia in chronic kidney disease: Secondary | ICD-10-CM | POA: Diagnosis present

## 2016-09-08 DIAGNOSIS — Z992 Dependence on renal dialysis: Secondary | ICD-10-CM

## 2016-09-08 DIAGNOSIS — I10 Essential (primary) hypertension: Secondary | ICD-10-CM

## 2016-09-08 DIAGNOSIS — E8729 Other acidosis: Secondary | ICD-10-CM | POA: Diagnosis present

## 2016-09-08 DIAGNOSIS — R509 Fever, unspecified: Secondary | ICD-10-CM | POA: Diagnosis present

## 2016-09-08 DIAGNOSIS — Y95 Nosocomial condition: Secondary | ICD-10-CM | POA: Diagnosis present

## 2016-09-08 DIAGNOSIS — E871 Hypo-osmolality and hyponatremia: Secondary | ICD-10-CM | POA: Diagnosis present

## 2016-09-08 DIAGNOSIS — Z87891 Personal history of nicotine dependence: Secondary | ICD-10-CM

## 2016-09-08 DIAGNOSIS — D638 Anemia in other chronic diseases classified elsewhere: Secondary | ICD-10-CM | POA: Diagnosis present

## 2016-09-08 DIAGNOSIS — E861 Hypovolemia: Secondary | ICD-10-CM | POA: Diagnosis present

## 2016-09-08 DIAGNOSIS — N185 Chronic kidney disease, stage 5: Secondary | ICD-10-CM | POA: Diagnosis not present

## 2016-09-08 DIAGNOSIS — M545 Low back pain: Secondary | ICD-10-CM | POA: Diagnosis present

## 2016-09-08 DIAGNOSIS — I5022 Chronic systolic (congestive) heart failure: Secondary | ICD-10-CM | POA: Diagnosis present

## 2016-09-08 DIAGNOSIS — IMO0002 Reserved for concepts with insufficient information to code with codable children: Secondary | ICD-10-CM

## 2016-09-08 DIAGNOSIS — Z79899 Other long term (current) drug therapy: Secondary | ICD-10-CM | POA: Diagnosis not present

## 2016-09-08 DIAGNOSIS — Z833 Family history of diabetes mellitus: Secondary | ICD-10-CM | POA: Diagnosis not present

## 2016-09-08 DIAGNOSIS — E1122 Type 2 diabetes mellitus with diabetic chronic kidney disease: Secondary | ICD-10-CM | POA: Diagnosis present

## 2016-09-08 DIAGNOSIS — R112 Nausea with vomiting, unspecified: Secondary | ICD-10-CM

## 2016-09-08 LAB — CBC WITH DIFFERENTIAL/PLATELET
BASOS PCT: 0 %
Basophils Absolute: 0 10*3/uL (ref 0.0–0.1)
Eosinophils Absolute: 0.2 10*3/uL (ref 0.0–0.7)
Eosinophils Relative: 1 %
HEMATOCRIT: 29 % — AB (ref 36.0–46.0)
HEMOGLOBIN: 9.2 g/dL — AB (ref 12.0–15.0)
LYMPHS ABS: 1.2 10*3/uL (ref 0.7–4.0)
LYMPHS PCT: 8 %
MCH: 25.7 pg — AB (ref 26.0–34.0)
MCHC: 31.7 g/dL (ref 30.0–36.0)
MCV: 81 fL (ref 78.0–100.0)
MONOS PCT: 8 %
Monocytes Absolute: 1.2 10*3/uL — ABNORMAL HIGH (ref 0.1–1.0)
NEUTROS ABS: 12.8 10*3/uL — AB (ref 1.7–7.7)
NEUTROS PCT: 83 %
Platelets: 238 10*3/uL (ref 150–400)
RBC: 3.58 MIL/uL — ABNORMAL LOW (ref 3.87–5.11)
RDW: 18.4 % — ABNORMAL HIGH (ref 11.5–15.5)
WBC: 15.3 10*3/uL — ABNORMAL HIGH (ref 4.0–10.5)

## 2016-09-08 LAB — COMPREHENSIVE METABOLIC PANEL
ALBUMIN: 2.2 g/dL — AB (ref 3.5–5.0)
ALT: 12 U/L — ABNORMAL LOW (ref 14–54)
ANION GAP: 17 — AB (ref 5–15)
AST: 12 U/L — ABNORMAL LOW (ref 15–41)
Alkaline Phosphatase: 93 U/L (ref 38–126)
BUN: 47 mg/dL — ABNORMAL HIGH (ref 6–20)
CALCIUM: 6.6 mg/dL — AB (ref 8.9–10.3)
CO2: 20 mmol/L — ABNORMAL LOW (ref 22–32)
Chloride: 91 mmol/L — ABNORMAL LOW (ref 101–111)
Creatinine, Ser: 10.27 mg/dL — ABNORMAL HIGH (ref 0.44–1.00)
GFR calc Af Amer: 5 mL/min — ABNORMAL LOW (ref >60)
GFR calc non Af Amer: 4 mL/min — ABNORMAL LOW (ref >60)
GLUCOSE: 235 mg/dL — AB (ref 65–99)
Potassium: 4.9 mmol/L (ref 3.5–5.1)
Sodium: 128 mmol/L — ABNORMAL LOW (ref 135–145)
Total Bilirubin: 0.6 mg/dL (ref 0.3–1.2)
Total Protein: 6.8 g/dL (ref 6.5–8.1)

## 2016-09-08 LAB — HCG, QUANTITATIVE, PREGNANCY: HCG, BETA CHAIN, QUANT, S: 4 m[IU]/mL (ref <5)

## 2016-09-08 LAB — I-STAT BETA HCG BLOOD, ED (MC, WL, AP ONLY): I-stat hCG, quantitative: 7.8 m[IU]/mL — ABNORMAL HIGH (ref <5)

## 2016-09-08 LAB — I-STAT CG4 LACTIC ACID, ED: Lactic Acid, Venous: 1.19 mmol/L (ref 0.5–1.9)

## 2016-09-08 MED ORDER — LIDOCAINE HCL (PF) 1 % IJ SOLN
5.0000 mL | Freq: Once | INTRAMUSCULAR | Status: AC
  Administered 2016-09-08: 5 mL via INTRADERMAL

## 2016-09-08 MED ORDER — ONDANSETRON 4 MG PO TBDP
4.0000 mg | ORAL_TABLET | Freq: Once | ORAL | Status: AC
  Administered 2016-09-08: 4 mg via ORAL
  Filled 2016-09-08: qty 1

## 2016-09-08 MED ORDER — DEXTROSE 5 % IV SOLN
2.0000 g | Freq: Once | INTRAVENOUS | Status: DC
  Filled 2016-09-08: qty 2

## 2016-09-08 MED ORDER — LIDOCAINE HCL (PF) 1 % IJ SOLN: 2.0000 mL | Freq: Once | INTRAMUSCULAR | Status: DC

## 2016-09-08 MED ORDER — LIDOCAINE HCL (PF) 1 % IJ SOLN
INTRAMUSCULAR | Status: AC
  Filled 2016-09-08: qty 5

## 2016-09-08 MED ORDER — ACETAMINOPHEN 325 MG PO TABS: 650.0000 mg | ORAL_TABLET | Freq: Four times a day (QID) | ORAL | Status: DC | PRN

## 2016-09-08 MED ORDER — ONDANSETRON HCL 4 MG/2ML IJ SOLN: 4.0000 mg | INTRAMUSCULAR | Status: DC

## 2016-09-08 MED ORDER — SODIUM CHLORIDE 0.9 % IV BOLUS (SEPSIS): 500.0000 mL | Freq: Once | INTRAVENOUS | Status: DC

## 2016-09-08 MED ORDER — ACETAMINOPHEN 650 MG RE SUPP: 650.0000 mg | Freq: Four times a day (QID) | RECTAL | Status: DC | PRN

## 2016-09-08 MED ORDER — FENTANYL CITRATE (PF) 100 MCG/2ML IJ SOLN
100.0000 ug | Freq: Once | INTRAMUSCULAR | Status: AC
  Administered 2016-09-08: 100 ug via INTRAVENOUS
  Filled 2016-09-08: qty 2

## 2016-09-08 MED ORDER — DEXTROSE 5 % IV SOLN: 1.0000 g | Freq: Three times a day (TID) | INTRAVENOUS | Status: DC

## 2016-09-08 MED ORDER — VANCOMYCIN HCL 10 G IV SOLR
1750.0000 mg | Freq: Once | INTRAVENOUS | Status: AC
  Administered 2016-09-08: 1750 mg via INTRAVENOUS
  Filled 2016-09-08: qty 1750

## 2016-09-08 MED ORDER — DEXTROSE 5 % IV SOLN
1.0000 g | INTRAVENOUS | Status: DC
  Administered 2016-09-08 – 2016-09-09 (×2): 1 g via INTRAVENOUS
  Filled 2016-09-08 (×2): qty 1

## 2016-09-08 NOTE — ED Notes (Signed)
edp starting ultrasound IV

## 2016-09-08 NOTE — ED Notes (Signed)
Dr. Danford at bedside at this time.  °

## 2016-09-08 NOTE — ED Triage Notes (Signed)
Pt arrives to ED by [facility] for back pain and Nausea for there last 3 days. Pt is a dialysis pt and missed last treatment.

## 2016-09-08 NOTE — ED Provider Notes (Signed)
[place] DEPT Provider Note   CSN: [id number] Arrival date & time: 09/08/16  1719     History   Chief Complaint Chief Complaint  Patient presents with  . Back Pain    HPI Karen [name] is a 40 y.o. female.  Karen [name] is a 40 y.o. Female with ESRD on dialysis TTS who presents to the ED complaining of right low back pain, chills, subjective fevers and vomiting since yesterday. Patient reports she was seen at [facility] emergency department 3 days ago for not feeling well and is diagnosed with pneumonia. She was prescribed Levaquin which she has been taking. She reports yesterday she developed fever and chills with some vomiting. She also complains of some pain to her right low back. She denies any coughing or shortness of breath. No chest pain. She denies any current nausea. No abdominal pain. She tells me she is on dialysis schedule of Tuesday, Thursday and Saturday. Last dialysis was Saturday. She missed dialysis today. Patient reports 3 episodes of vomiting today. No hematemesis. No diarrhea. Last bowel movement was today and was normal. Patient reports she makes a small amount of urine. Patient denies coughing, shortness of breath, chest pain, abdominal pain, hematemesis, hematochezia, sore throat, trouble swallowing or rashes.  Patient tells me she has not been sexually active in years.    The history is provided by the patient. No language interpreter was used.  Back Pain   Associated symptoms include a fever. Pertinent negatives include no chest pain, no headaches, no abdominal pain, no dysuria and no weakness.    Past Medical History:  Diagnosis Date  . Anemia   . Anxiety   . Exertional shortness of breath    "recently; it's fluid" (02/02/2013)  . GERD (gastroesophageal reflux disease)   . History of blood transfusion    "last week" (02/02/2013)  . Hypertension   . Ischemic cardiomyopathy    by echo 2014  . Osteomyelitis of toe of left foot ([place])    "off and on  since 2009; no OR" (02/02/2013)  . Renal failure    acute vs chronic/notes 02/02/2013  . Type II diabetes mellitus ([place]) 1995    Patient Active Problem List   Diagnosis Date Noted  . Anemia in other chronic diseases classified elsewhere 09/08/2016  . High anion gap metabolic acidosis 66/29/4765  . Right lower lobe pneumonia ([place]) 09/08/2016  . Uncontrolled type 2 diabetes mellitus with chronic kidney disease on chronic dialysis, with long-term current use of insulin ([place]) 01/31/2016  . Noncompliance with medications   . End-stage renal disease on hemodialysis ([place])   . Hx of BKA ([place]) 07/03/2013  . S/P BKA (below knee amputation) unilateral ([place]) 06/30/2013  . Physical deconditioning 06/30/2013  . Cardiomyopathy, ischemic - EF 45-50% with inf WMA by 2D 02/05/13 02/06/2013  . Essential hypertension 02/05/2013  . PAD (peripheral artery disease) ([place]) 02/05/2013  . Diabetic neuropathy ([place])   . Osteomyelitis of left great toe - S/P amputation 02/06/13 04/21/2011    Past Surgical History:  Procedure Laterality Date  . AMPUTATION Left 02/06/2013   Procedure: AMPUTATION LEFT GREAT TOE;  Surgeon: Wylene Simmer, MD;  Location: [facility];  Service: Orthopedics;  Laterality: Left;  . AMPUTATION Left 06/24/2013   Procedure: AMPUTATION BELOW KNEE ;  Surgeon: Wylene Simmer, MD;  Location: [facility];  Service: Orthopedics;  Laterality: Left;  . CESAREAN SECTION  10/18/2006  . INSERTION OF DIALYSIS CATHETER      OB History  No data available       Home Medications    Prior to Admission medications   Medication Sig Start Date End Date Taking? Authorizing Provider  acetaminophen (TYLENOL) 500 MG tablet Take 1,000 mg by mouth every 6 (six) hours as needed.   Yes Historical Provider, MD  amLODipine (NORVASC) 5 MG tablet Take 1 tablet (5 mg total) by mouth daily. 05/29/16  Yes Oswald Hillock, MD  Aspirin-Salicylamide-Caffeine (BC FAST PAIN RELIEF) [phone] MG PACK Take 1 Package by mouth daily as needed  (pain).   Yes Historical Provider, MD  carvedilol (COREG) 25 MG tablet Take 25 mg by mouth 2 (two) times daily with a meal.  01/14/16  Yes Historical Provider, MD  insulin glargine (LANTUS) 100 UNIT/ML injection Inject 18 Units into the skin at bedtime.   Yes Historical Provider, MD  insulin lispro (HUMALOG) 100 UNIT/ML injection Inject 2-5 Units into the skin every evening. Per sliding scale   Yes Historical Provider, MD    Family History Family History  Problem Relation Age of Onset  . Hypertension Mother   . Diabetes Mother   . Diabetes Father     Social History Social History  Substance Use Topics  . Smoking status: Former Smoker    Packs/day: 3.00    Years: 10.00    Types: Cigarettes    Quit date: 09/21/2004  . Smokeless tobacco: Never Used  . Alcohol use No     Allergies   Morphine and related; Novolog [insulin aspart]; and Penicillins   Review of Systems Review of Systems  Constitutional: Positive for chills, fatigue and fever.  HENT: Negative for congestion and sore throat.   Eyes: Negative for visual disturbance.  Respiratory: Negative for cough, shortness of breath and wheezing.   Cardiovascular: Negative for chest pain and palpitations.  Gastrointestinal: Positive for nausea and vomiting. Negative for abdominal pain, blood in stool, constipation and diarrhea.  Genitourinary: Positive for flank pain. Negative for dysuria and hematuria.  Musculoskeletal: Positive for back pain. Negative for neck pain.  Skin: Negative for rash.  Neurological: Negative for syncope, weakness, light-headedness and headaches.     Physical Exam Updated Vital Signs BP (!) 161/94 (BP Location: Right Arm)   Pulse (!) 104   Temp 98.8 F (37.1 C) (Oral)   Resp 20   Ht 5\' 6"  (1.676 m)   Wt 101.6 kg   SpO2 100%   BMI 36.14 kg/m   Physical Exam  Constitutional: She appears well-developed and well-nourished. No distress.  Nontoxic appearing.  HENT:  Head: Normocephalic and  atraumatic.  Mouth/Throat: Oropharynx is clear and moist. No oropharyngeal exudate.  Eyes: Conjunctivae are normal. Pupils are equal, round, and reactive to light. Right eye exhibits no discharge. Left eye exhibits no discharge.  Neck: Neck supple.  Cardiovascular: Normal rate, regular rhythm, normal heart sounds and intact distal pulses.  Exam reveals no gallop and no friction rub.   No murmur heard. Pulmonary/Chest: Effort normal and breath sounds normal. No respiratory distress. She has no wheezes. She has no rales.  Diminished lung sounds are bilateral bases.  Abdominal: Soft. Bowel sounds are normal. She exhibits no distension and no mass. There is no tenderness. There is no guarding.  Abdomen is soft and nontender to palpation. No CVA or flank tenderness.  Musculoskeletal: She exhibits no edema or tenderness.  No midline neck or back tenderness. Left below the knee amputation.  Lymphadenopathy:    She has no cervical adenopathy.  Neurological: She is alert.  Coordination normal.  Skin: Skin is warm and dry. Capillary refill takes less than 2 seconds. No rash noted. She is not diaphoretic. No erythema. No pallor.  Psychiatric: She has a normal mood and affect. Her behavior is normal.  Nursing note and vitals reviewed.    ED Treatments / Results  Labs (all labs ordered are listed, but only abnormal results are displayed) Labs Reviewed  CBC WITH DIFFERENTIAL/PLATELET - Abnormal; Notable for the following:       Result Value   WBC 15.3 (*)    RBC 3.58 (*)    Hemoglobin 9.2 (*)    HCT 29.0 (*)    MCH 25.7 (*)    RDW 18.4 (*)    Neutro Abs 12.8 (*)    Monocytes Absolute 1.2 (*)    All other components within normal limits  COMPREHENSIVE METABOLIC PANEL - Abnormal; Notable for the following:    Sodium 128 (*)    Chloride 91 (*)    CO2 20 (*)    Glucose, Bld 235 (*)    BUN 47 (*)    Creatinine, Ser 10.27 (*)    Calcium 6.6 (*)    Albumin 2.2 (*)    AST 12 (*)    ALT 12 (*)     GFR calc non Af Amer 4 (*)    GFR calc Af Amer 5 (*)    Anion gap 17 (*)    All other components within normal limits  BETA-HYDROXYBUTYRIC ACID - Abnormal; Notable for the following:    Beta-Hydroxybutyric Acid 0.39 (*)    All other components within normal limits  GLUCOSE, CAPILLARY - Abnormal; Notable for the following:    Glucose-Capillary 197 (*)    All other components within normal limits  I-STAT BETA HCG BLOOD, ED ([facility], [facility], AP ONLY) - Abnormal; Notable for the following:    I-stat hCG, quantitative 7.8 (*)    All other components within normal limits  MRSA PCR SCREENING  GRAM STAIN  CULTURE, EXPECTORATED SPUTUM-ASSESSMENT  HCG, QUANTITATIVE, PREGNANCY  LACTIC ACID, PLASMA  URINALYSIS, ROUTINE W REFLEX MICROSCOPIC  STREP PNEUMONIAE URINARY ANTIGEN  BASIC METABOLIC PANEL  CBC  I-STAT CG4 LACTIC ACID, ED    EKG  EKG Interpretation  Date/Time:  Tuesday September 08 2016 17:51:22 EST Ventricular Rate:  94 PR Interval:    QRS Duration: 101 QT Interval:  386 QTC Calculation: 483 R Axis:   71 Text Interpretation:  Sinus rhythm Baseline wander in lead(s) V1 No significant change since last tracing Confirmed by [facility] MD, ERIN ([id number]) on 09/08/2016 8:22:40 PM       Radiology Dg Chest 2 View  Result Date: 09/08/2016 CLINICAL DATA:  40 year old female recently discharged from [facility] with pneumonia. Cough fever vomiting back pain. Initial encounter. EXAM: CHEST  2 VIEW COMPARISON:  [facility] portable chest 05/27/2016, and CT Abdomen and Pelvis 09/06/2016 FINDINGS: AP and lateral semi upright views at at 1847 hours. Stable right chest dual lumen dialysis type catheter. Stable cardiomegaly and mediastinal contours. Visualized tracheal air column is within normal limits. No pneumothorax, pulmonary edema, or pleural effusion. Confluent right lower lobe pulmonary opacity appears stable to that seen on the recent CT Abdomen and Pelvis. No pleural effusion. No  acute osseous abnormality identified. IMPRESSION: Lateral basal segment right lower lobe pneumonia appears stable from the recent CT Abdomen and Pelvis. No pleural effusion or new cardiopulmonary abnormality. Electronically Signed   By: Genevie Ann M.D.   On: 09/08/2016 19:20  Procedures Procedures (including critical care time)  Medications Ordered in ED Medications  amLODipine (NORVASC) tablet 5 mg (not administered)  insulin glargine (LANTUS) injection 18 Units (18 Units Subcutaneous Given 09/09/16 0057)  insulin aspart (novoLOG) injection 0-9 Units (not administered)  insulin aspart (novoLOG) injection 0-5 Units (0 Units Subcutaneous Not Given 09/09/16 0043)  heparin injection 5,000 Units (5,000 Units Subcutaneous Given 09/09/16 0057)  acetaminophen (TYLENOL) tablet 650 mg (not administered)    Or  acetaminophen (TYLENOL) suppository 650 mg (not administered)  oxyCODONE (Oxy IR/ROXICODONE) immediate release tablet 5 mg (5 mg Oral Given 09/09/16 0142)  senna-docusate (Senokot-S) tablet 1 tablet (not administered)  ondansetron (ZOFRAN) tablet 4 mg (not administered)    Or  ondansetron (ZOFRAN) injection 4 mg (not administered)  ceFEPIme (MAXIPIME) 1 g in dextrose 5 % 50 mL IVPB (1 g Intravenous Transfusing/Transfer 09/08/16 2354)  lidocaine (PF) (XYLOCAINE) 1 % injection (not administered)  ondansetron (ZOFRAN-ODT) disintegrating tablet 4 mg (4 mg Oral Given 09/08/16 1927)  fentaNYL (SUBLIMAZE) injection 100 mcg (100 mcg Intravenous Given 09/08/16 2335)  vancomycin (VANCOCIN) 1,750 mg in sodium chloride 0.9 % 500 mL IVPB (1,750 mg Intravenous Transfusing/Transfer 09/08/16 2353)  lidocaine (PF) (XYLOCAINE) 1 % injection 5 mL (5 mLs Intradermal Given by Other 09/08/16 2310)     Initial Impression / Assessment and Plan / ED Course  I have reviewed the triage vital signs and the nursing notes.  Pertinent labs & imaging results that were available during my care of the patient were  reviewed by me and considered in my medical decision making (see chart for details).  Clinical Course as of Sep 09 156  Tue Sep 08, 2016  8299 Patient tells me she has not been sexually active in years.  I-stat hCG, quantitative: (!) 7.8 [WD]    Clinical Course User Index [WD] Waynetta Pean, PA-C   This  is a 40 y.o. Female with ESRD on dialysis TTS who presents to the ED complaining of right low back pain, chills, subjective fevers and vomiting since yesterday. Patient reports she was seen at [facility] emergency department 3 days ago for not feeling well and is diagnosed with pneumonia. She was prescribed Levaquin which she has been taking. She reports yesterday she developed fever and chills with some vomiting. She also complains of some pain to her right low back. She denies any coughing or shortness of breath. No chest pain. She denies any current nausea. No abdominal pain. She tells me she is on dialysis schedule of Tuesday, Thursday and Saturday. Last dialysis was Saturday. She missed dialysis today. Patient reports 3 episodes of vomiting today. No hematemesis. No diarrhea. On examination patient is afebrile nontoxic appearing. Diminished lung sounds are bilateral bases. Abdomen is soft and nontender to palpation. Lactic acid is within normal limits. CBC is remarkable for white count of 15,300. Hemoglobin is stable around 9.2. I-STAT hCG is 7.8. Patient tells me she's not been sexually active in years. I suspect this is related to her dialysis. Quantitative hCG in the lab is 4. Chest x-ray shows lateral basal segment right lower lobe pneumonia. This is likely the cause of her right back pain. Will start abx coverage for healthcare associated pneumonia as the patient is on dialysis. Will admit for failure of outpatient therapy with Levaquin. I consulted with hospitalist Dr. Loleta Books who accepted the patient for admission. Ultrasound IV started by Dr. Billy Fischer.   This patient was dicussed with  Dr. Billy Fischer who agrees with assessment and plan.  Final Clinical Impressions(s) / ED Diagnoses   Final diagnoses:  Community acquired pneumonia of right lower lobe of lung ([place])  Non-intractable vomiting with nausea, unspecified vomiting type    New Prescriptions Current Discharge Medication List       Waynetta Pean, PA-C 09/09/16 0202    Gareth Morgan, MD 09/10/16 [phone]

## 2016-09-08 NOTE — ED Notes (Signed)
Dr. Billy Fischer at bedside inserting IV with ultrasound.

## 2016-09-08 NOTE — H&P (Signed)
History and Physical  Patient Name: Karen [name]     SEG:[id number]    DOB: May 17, 1976    DOA: 09/08/2016 PCP: Harvie Junior, MD   Patient coming from: Home  Chief Complaint: Back pain, subjective fevers, malaise  HPI: Karen [name] is a 40 y.o. female with a past medical history significant for ESRD on HD TThS, IDDM, recurrent Osteo now s/p L BKA, HTN and ischemic CM with EF 40-45% who presents with 1 day cough, right back pain, vomiting, malaise.  Over the weekend, the patient developed subjective fevers, difficulty breathing, cough and right flank pain. She was seen in the emergency room on Sunday at [facility], because of flank pain a CT of the abdomen and pelvis was obtained that showed right lower lobe pneumonia, she was started on oral Levaquin and discharged. Incidentally, on the same day her son was evaluated for similar symptoms, diagnosed with pneumonia and admitted to the hospital at [facility].  For the last 2 days she was staying with her son at the hospital, taking Levaquin as prescribed, and initially felt better. However, today on leaving the hospital with her son, she felt subjective fevers again, vague malaise, worsening of her right back pain, and return of vomiting, nonbloody nonbilious. Her family noticed that her breathing appeared more labored than usual, so they asked her to come to the emergency room.  ED course: -Afebrile, heart rate 95, respirations 25, blood pressure 174/111, pulse oximetry normal on room air -Na 128, K 4.9, Cr 10.2, WBC 15.3K, Hgb 9.2 -Lactic acid 1.1 -CXR showed stable RLL opacity -TRH were asked to evaluate for pneumonia failed outpatient therapy     ROS: Review of Systems  Constitutional: Positive for chills, fever and malaise/fatigue.  HENT: Positive for congestion. Negative for sore throat.   Respiratory: Positive for cough. Negative for hemoptysis, sputum production, shortness of breath and wheezing.   Gastrointestinal:  Positive for diarrhea, nausea and vomiting. Negative for abdominal pain, blood in stool and melena.  Musculoskeletal: Positive for back pain.  All other systems reviewed and are negative.         Past Medical History:  Diagnosis Date  . Anemia   . Anxiety   . Exertional shortness of breath    "recently; it's fluid" (02/02/2013)  . GERD (gastroesophageal reflux disease)   . History of blood transfusion    "last week" (02/02/2013)  . Hypertension   . Ischemic cardiomyopathy    by echo 2014  . Osteomyelitis of toe of left foot ([place])    "off and on since 2009; no OR" (02/02/2013)  . Renal failure    acute vs chronic/notes 02/02/2013  . Type II diabetes mellitus ([place]) 1995    Past Surgical History:  Procedure Laterality Date  . AMPUTATION Left 02/06/2013   Procedure: AMPUTATION LEFT GREAT TOE;  Surgeon: Wylene Simmer, MD;  Location: [place];  Service: Orthopedics;  Laterality: Left;  . AMPUTATION Left 06/24/2013   Procedure: AMPUTATION BELOW KNEE ;  Surgeon: Wylene Simmer, MD;  Location: [place];  Service: Orthopedics;  Laterality: Left;  . CESAREAN SECTION  10/18/2006  . INSERTION OF DIALYSIS CATHETER      Social History: Patient lives with her children.  The patient walks unassisetd, has a prosthesis.  Remote former smoker. Worked in Human resources officer, now disabled.  From [place].    Allergies  Allergen Reactions  . Morphine And Related Hives and Rash  . Novolog [Insulin Aspart] Other (See Comments)  Cramps/ Gi distress  . Penicillins Hives and Rash    Has patient had a PCN reaction causing immediate rash, facial/tongue/throat swelling, SOB or lightheadedness with hypotension: Yes Has patient had a PCN reaction causing severe rash involving mucus membranes or skin necrosis: Yes Has patient had a PCN reaction that required hospitalization Yes Has patient had a PCN reaction occurring within the last 10 years: No If all of the above answers are "NO", then may proceed with Cephalosporin  use.     Family history: family history includes Diabetes in her father and mother; Hypertension in her mother.  Prior to Admission medications   Medication Sig Start Date End Date Taking? Authorizing Provider  acetaminophen (TYLENOL) 500 MG tablet Take 1,000 mg by mouth every 6 (six) hours as needed.   Yes Historical Provider, MD  amLODipine (NORVASC) 5 MG tablet Take 1 tablet (5 mg total) by mouth daily. 05/29/16  Yes Oswald Hillock, MD  Aspirin-Salicylamide-Caffeine (BC FAST PAIN RELIEF) [phone] MG PACK Take 1 Package by mouth daily as needed (pain).   Yes Historical Provider, MD  carvedilol (COREG) 25 MG tablet Take 25 mg by mouth 2 (two) times daily with a meal.  01/14/16  Yes Historical Provider, MD  insulin glargine (LANTUS) 100 UNIT/ML injection Inject 18 Units into the skin at bedtime.   Yes Historical Provider, MD  insulin lispro (HUMALOG) 100 UNIT/ML injection Inject 2-5 Units into the skin every evening. Per sliding scale   Yes Historical Provider, MD       Physical Exam: BP (!) 174/111 (BP Location: Left Arm)   Pulse 95   Temp 98.4 F (36.9 C) (Oral)   SpO2 98%  General appearance: Obese adult female, alert and in no acute distress, appears tired, weak, but alert.   Eyes: Anicteric, conjunctiva pink, lids and lashes normal. PERRL.    ENT: No nasal deformity, discharge, epistaxis.  Hearing normal. OP dry without lesions.   Neck: No neck masses.  Trachea midline.  No thyromegaly/tenderness. Lymph: No cervical or supraclavicular lymphadenopathy. Skin: Warm and dry.  No jaundice.  No suspicious rashes or lesions.  Innumerable post-traumatic inflammatory papules on arms, legs. Cardiac: Tachycardic, regular, nl S1-S2, no murmurs appreciated.  Capillary refill is brisk.  JVP not visible.  No LE edema.  Radial pulses 2+ and symmetric.  Right DP pulse normal. Respiratory: Normal respiratory rate and rhythm.  No wheezes.  Diminshed in right base. Abdomen: Abdomen soft.  No TTP. No  ascites, distension, hepatosplenomegaly.   MSK: No deformities or effusions.  No cyanosis or clubbing. Neuro: Cranial nerves normal.  Sensation intact to light touch. Speech is fluent.  Muscle strength normal.    Psych: Sensorium intact and responding to questions, attention normal.  Behavior appropriate.  Affect normal.  Judgment and insight appear normal.     Labs on Admission:  I have personally reviewed following labs and imaging studies: CBC:  Recent Labs Lab 09/08/16 1800  WBC 15.3*  NEUTROABS 12.8*  HGB 9.2*  HCT 29.0*  MCV 81.0  PLT 803   Basic Metabolic Panel:  Recent Labs Lab 09/08/16 1800  NA 128*  K 4.9  CL 91*  CO2 20*  GLUCOSE 235*  BUN 47*  CREATININE 10.27*  CALCIUM 6.6*   GFR: CrCl cannot be calculated (Unknown ideal weight.).  Liver Function Tests:  Recent Labs Lab 09/08/16 1800  AST 12*  ALT 12*  ALKPHOS 93  BILITOT 0.6  PROT 6.8  ALBUMIN 2.2*   No results  for input(s): LIPASE, AMYLASE in the last 168 hours. No results for input(s): AMMONIA in the last 168 hours. Coagulation Profile: No results for input(s): INR, PROTIME in the last 168 hours. Cardiac Enzymes: No results for input(s): CKTOTAL, CKMB, CKMBINDEX, TROPONINI in the last 168 hours. BNP (last 3 results) No results for input(s): PROBNP in the last 8760 hours. HbA1C: No results for input(s): HGBA1C in the last 72 hours. CBG: No results for input(s): GLUCAP in the last 168 hours. Lipid Profile: No results for input(s): CHOL, HDL, LDLCALC, TRIG, CHOLHDL, LDLDIRECT in the last 72 hours. Thyroid Function Tests: No results for input(s): TSH, T4TOTAL, FREET4, T3FREE, THYROIDAB in the last 72 hours. Anemia Panel: No results for input(s): VITAMINB12, FOLATE, FERRITIN, TIBC, IRON, RETICCTPCT in the last 72 hours. Sepsis Labs: Lactic acid 1.19 Invalid input(s): PROCALCITONIN, LACTICIDVEN No results found for this or any previous visit (from the past 240 hour(s)).        Radiological Exams on Admission: Personally reviewed CXR shows focal opacity in right base: Dg Chest 2 View  Result Date: 09/08/2016 CLINICAL DATA:  40 year old female recently discharged from [facility] with pneumonia. Cough fever vomiting back pain. Initial encounter. EXAM: CHEST  2 VIEW COMPARISON:  [facility] portable chest 05/27/2016, and CT Abdomen and Pelvis 09/06/2016 FINDINGS: AP and lateral semi upright views at at 1847 hours. Stable right chest dual lumen dialysis type catheter. Stable cardiomegaly and mediastinal contours. Visualized tracheal air column is within normal limits. No pneumothorax, pulmonary edema, or pleural effusion. Confluent right lower lobe pulmonary opacity appears stable to that seen on the recent CT Abdomen and Pelvis. No pleural effusion. No acute osseous abnormality identified. IMPRESSION: Lateral basal segment right lower lobe pneumonia appears stable from the recent CT Abdomen and Pelvis. No pleural effusion or new cardiopulmonary abnormality. Electronically Signed   By: Genevie Ann M.D.   On: 09/08/2016 19:20    EKG: Independently reviewed. Rate 94, QTc 483, sinus tachycardia.    Assessment/Plan  1. Pneumonia:  Meets early warning criteria, but not septic.  Lactate normal, no evidence of end organ dysfunction.   Risk factors for drug resistant infections includes: On dialysis, failed Levaquin.   -Vancomycin and cefepime IV -MRSA swab and discontinue vanc if negative -Follow procalcitonin trend -Sputum culture if able -Follow blood culture -S pneumo urine antigen -IS q4hrs WA   2. Anion gap metabolic acidosis:  Missed HD today. -Check BHOB and start insulin infusion and obtain ABG if significantly elevated -Repeat BMP tomorrow  3. ESRD on HD:  -Will contact Nephrology for HD orders tomorrow  4. Anemia of renal disease:  Stable  5. Hyponatremia:  Euvolemic to dry on exam. -HD per nephrology  6. HTN:  -Continue  amlodipine -Hold carvedilol overnight until hemodynamic status clearer  7. Ischemic CM:  EF 40-45%.  Euvolemic to hypovolemic on exam.  8. IDDM: HgbA1c 11% in Sept. -Continue Lantus -SSI with meals     DVT prophylaxis: heparin  Code Status: Full  Family Communication: Sisters at bedside  Disposition Plan: Anticipate IV antibiotics, follow blood, sputum cultures and s pneumo antigen. De-escalate antibiotics and transition to oral antibiotics when stable. Consults called: None Admission status: INPATIENT, med surg       Medical decision making: Patient seen at 9:00 PM on 09/08/2016.  The patient was discussed with Will Dansie, PA-C.  What exists of the patient's chart was reviewed in depth and summarized above.        [facility] Pager [phone]       At the time of admission, it appears that the appropriate admission status for this patient is INPATIENT. This is judged to be reasonable and necessary in order to provide the required intensity of service to ensure the patient's safety given the presenting symptoms, physical exam findings, and initial radiographic and laboratory data in the context of their chronic comorbidities.  Together, these circumstances are felt to place her/him at high risk for further clinical deterioration threatening life, limb, or organ. The following factors support the admission status of inpatient:   A. The patient's presenting symptoms include back pain, subjective fevers, cough, nausea/vomiting. B. The worrisome physical exam findings include diminished breath sounds, tachypnea, increased respiratory effort C. The initial radiographic and laboratory data are worrisome because of pneumonia lobar on CXR.  Hyponatremia, elevated anion gap, hyperglycemia, anemia, leukocytosis D. The chronic co-morbidities include end stage renal failure on dialysis, ischemic cardiomyopathy/chronic systolic CHF, hypertension, insulin  dependent diabetes E. Patient requires inpatient status due to high intensity of service, high risk for further deterioration and high frequency of surveillance required because of this acute illness that poses a threat to life or bodily function and which failed appropriate outpatient therapy. F. I certify that at the point of admission it is my clinical judgment that the patient will require inpatient hospital care spanning beyond 2 midnights from the point of admission and that early discharge would result in unnecessary risk of decompensation and readmission or threat to life, limb or bodily function.

## 2016-09-08 NOTE — Progress Notes (Signed)
Pharmacy Antibiotic Note Karen [name] is a 40 y.o. female admitted on 09/08/2016 with SOB and concern for PNA that did not respond to outpt Levaquin in setting of missed IHD sessions. Pt received Azactam x 1 in ED.  Pharmacy has been consulted for vancomycin and cefepime dosing.  Plan: Vancomycin 1750mg  load followed by 1g qHD Cefepime 1g IV q24h Follow up on plan on IHD schedule and order subsequent doses as needed  Monitor culture data, HD plan and clinical course VT at SS prn     Temp (24hrs), Avg:98.4 F (36.9 C), Min:98.4 F (36.9 C), Max:98.4 F (36.9 C)   Recent Labs Lab 09/08/16 1800 09/08/16 1809  WBC 15.3*  --   CREATININE 10.27*  --   LATICACIDVEN  --  1.19    CrCl cannot be calculated (Unknown ideal weight.).    Allergies  Allergen Reactions  . Morphine And Related Hives and Rash  . Novolog [Insulin Aspart] Other (See Comments)    Cramps/ Gi distress  . Penicillins Hives and Rash    Has patient had a PCN reaction causing immediate rash, facial/tongue/throat swelling, SOB or lightheadedness with hypotension: No Has patient had a PCN reaction causing severe rash involving mucus membranes or skin necrosis: No Has patient had a PCN reaction that required hospitalization No Has patient had a PCN reaction occurring within the last 10 years: No If all of the above answers are "NO", then may proceed with Cephalosporin use.     Antimicrobials this admission: 12/19 vancomycin  >>  12/19 cefepime >>  Andrey Cota. Diona Foley, PharmD, [place] Clinical Pharmacist Pager [phone] 09/08/2016, 9:32 PM

## 2016-09-08 NOTE — ED Notes (Signed)
Patient transported to X-ray 

## 2016-09-08 NOTE — ED Notes (Signed)
IV medications delayed. No IV yet. IV team still at bedside.

## 2016-09-08 NOTE — ED Notes (Signed)
IV team unsuccessful for IV insertion. MD notified. Dr. Billy Fischer to attempt to insert IV.

## 2016-09-08 NOTE — ED Notes (Signed)
Dr. Billy Fischer at bedside for US guided IV insertion.

## 2016-09-08 NOTE — Progress Notes (Signed)
Pharmacy Antibiotic Note Karen [name] is a 40 y.o. female admitted on 09/08/2016 with SOB and concern for PNA that did not respond to outpt Levaquin in setting of missed IHD sessions. Pt received Azactam x 1 in ED.  Pharmacy has been consulted for vancomycin  dosing.  Plan: 1. Vancomycin 1750 mg x 1; follow up on planned on IHD schedule and order subsequent doses as needed  2. If felt that further gram negative coverage is required would use cephalosporin as has tolerated previously      Temp (24hrs), Avg:98.4 F (36.9 C), Min:98.4 F (36.9 C), Max:98.4 F (36.9 C)   Recent Labs Lab 09/08/16 1800 09/08/16 1809  WBC 15.3*  --   CREATININE 10.27*  --   LATICACIDVEN  --  1.19    CrCl cannot be calculated (Unknown ideal weight.).    Allergies  Allergen Reactions  . Morphine And Related Hives and Rash  . Novolog [Insulin Aspart] Other (See Comments)    Cramps/ Gi distress  . Penicillins Hives and Rash    Has patient had a PCN reaction causing immediate rash, facial/tongue/throat swelling, SOB or lightheadedness with hypotension: Yes Has patient had a PCN reaction causing severe rash involving mucus membranes or skin necrosis: Yes Has patient had a PCN reaction that required hospitalization Yes Has patient had a PCN reaction occurring within the last 10 years: No If all of the above answers are "NO", then may proceed with Cephalosporin use.     Antimicrobials this admission: 12/19 vancomycin  >>  12/19 Azactam x 1  Thank you for allowing pharmacy to be a part of this patient's care.  Vincenza Hews, PharmD, BCPS 09/08/2016, 8:37 PM Pager: [phone]

## 2016-09-08 NOTE — ED Notes (Signed)
IV team staff member at bedside at this time. Dr. Loleta Books still at bedside.

## 2016-09-09 DIAGNOSIS — N185 Chronic kidney disease, stage 5: Secondary | ICD-10-CM

## 2016-09-09 LAB — BASIC METABOLIC PANEL
Anion gap: 18 — ABNORMAL HIGH (ref 5–15)
Anion gap: 15 (ref 5–15)
BUN: 50 mg/dL — AB (ref 6–20)
BUN: 49 mg/dL — AB (ref 6–20)
CALCIUM: 6.4 mg/dL — AB (ref 8.9–10.3)
CHLORIDE: 93 mmol/L — AB (ref 101–111)
CO2: 17 mmol/L — AB (ref 22–32)
CO2: 21 mmol/L — ABNORMAL LOW (ref 22–32)
CREATININE: 10.21 mg/dL — AB (ref 0.44–1.00)
CREATININE: 10.34 mg/dL — AB (ref 0.44–1.00)
Calcium: 7 mg/dL — ABNORMAL LOW (ref 8.9–10.3)
Chloride: 91 mmol/L — ABNORMAL LOW (ref 101–111)
GFR calc Af Amer: 5 mL/min — ABNORMAL LOW (ref >60)
GFR calc non Af Amer: 4 mL/min — ABNORMAL LOW (ref >60)
GFR calc non Af Amer: 4 mL/min — ABNORMAL LOW (ref >60)
GFR, EST AFRICAN AMERICAN: 5 mL/min — AB (ref >60)
GLUCOSE: 128 mg/dL — AB (ref 65–99)
Glucose, Bld: 203 mg/dL — ABNORMAL HIGH (ref 65–99)
Potassium: 5.4 mmol/L — ABNORMAL HIGH (ref 3.5–5.1)
Potassium: 4.8 mmol/L (ref 3.5–5.1)
SODIUM: 126 mmol/L — AB (ref 135–145)
SODIUM: 129 mmol/L — AB (ref 135–145)

## 2016-09-09 LAB — URINALYSIS, ROUTINE W REFLEX MICROSCOPIC
Bacteria, UA: NONE SEEN
Bilirubin Urine: NEGATIVE
Hgb urine dipstick: NEGATIVE
Ketones, ur: NEGATIVE mg/dL
Leukocytes, UA: NEGATIVE
NITRITE: NEGATIVE
PH: 7 (ref 5.0–8.0)
Protein, ur: >=300 mg/dL — AB
Specific Gravity, Urine: 1.015 (ref 1.005–1.030)

## 2016-09-09 LAB — CBC
HCT: 27.6 % — ABNORMAL LOW (ref 36.0–46.0)
Hemoglobin: 8.7 g/dL — ABNORMAL LOW (ref 12.0–15.0)
MCH: 25.4 pg — ABNORMAL LOW (ref 26.0–34.0)
MCHC: 31.5 g/dL (ref 30.0–36.0)
MCV: 80.7 fL (ref 78.0–100.0)
PLATELETS: 283 10*3/uL (ref 150–400)
RBC: 3.42 MIL/uL — AB (ref 3.87–5.11)
RDW: 18.7 % — AB (ref 11.5–15.5)
WBC: 15.8 10*3/uL — AB (ref 4.0–10.5)

## 2016-09-09 LAB — STREP PNEUMONIAE URINARY ANTIGEN: Strep Pneumo Urinary Antigen: NEGATIVE

## 2016-09-09 LAB — GLUCOSE, CAPILLARY
GLUCOSE-CAPILLARY: 182 mg/dL — AB (ref 65–99)
GLUCOSE-CAPILLARY: 197 mg/dL — AB (ref 65–99)
Glucose-Capillary: 169 mg/dL — ABNORMAL HIGH (ref 65–99)
Glucose-Capillary: 88 mg/dL (ref 65–99)

## 2016-09-09 LAB — BETA-HYDROXYBUTYRIC ACID
BETA-HYDROXYBUTYRIC ACID: 0.16 mmol/L (ref 0.05–0.27)
BETA-HYDROXYBUTYRIC ACID: 0.39 mmol/L — AB (ref 0.05–0.27)

## 2016-09-09 LAB — LACTIC ACID, PLASMA: LACTIC ACID, VENOUS: 0.9 mmol/L (ref 0.5–1.9)

## 2016-09-09 LAB — MRSA PCR SCREENING: MRSA BY PCR: NEGATIVE

## 2016-09-09 MED ORDER — DARBEPOETIN ALFA 100 MCG/0.5ML IJ SOSY
PREFILLED_SYRINGE | INTRAMUSCULAR | Status: AC
  Filled 2016-09-09: qty 0.5

## 2016-09-09 MED ORDER — ONDANSETRON HCL 4 MG/2ML IJ SOLN
4.0000 mg | Freq: Four times a day (QID) | INTRAMUSCULAR | Status: DC | PRN
  Administered 2016-09-09: 4 mg via INTRAVENOUS
  Filled 2016-09-09: qty 2

## 2016-09-09 MED ORDER — SODIUM CHLORIDE 0.9 % IV SOLN: 100.0000 mL | INTRAVENOUS | Status: DC | PRN

## 2016-09-09 MED ORDER — HEPARIN SODIUM (PORCINE) 1000 UNIT/ML DIALYSIS
20.0000 [IU]/kg | INTRAMUSCULAR | Status: DC | PRN
  Administered 2016-09-10: 2000 [IU] via INTRAVENOUS_CENTRAL
  Filled 2016-09-09 (×2): qty 2

## 2016-09-09 MED ORDER — PRO-STAT SUGAR FREE PO LIQD
30.0000 mL | Freq: Two times a day (BID) | ORAL | Status: DC
  Administered 2016-09-09: 30 mL via ORAL
  Filled 2016-09-09: qty 30

## 2016-09-09 MED ORDER — INSULIN ASPART 100 UNIT/ML ~~LOC~~ SOLN: 0.0000 [IU] | Freq: Every day | SUBCUTANEOUS | Status: DC

## 2016-09-09 MED ORDER — INSULIN ASPART 100 UNIT/ML ~~LOC~~ SOLN
0.0000 [IU] | Freq: Three times a day (TID) | SUBCUTANEOUS | Status: DC
  Administered 2016-09-09: 2 [IU] via SUBCUTANEOUS

## 2016-09-09 MED ORDER — HEPARIN SODIUM (PORCINE) 1000 UNIT/ML DIALYSIS: 1000.0000 [IU] | INTRAMUSCULAR | Status: DC | PRN

## 2016-09-09 MED ORDER — VANCOMYCIN HCL IN DEXTROSE 1-5 GM/200ML-% IV SOLN: 1000.0000 mg | INTRAVENOUS | Status: DC

## 2016-09-09 MED ORDER — ALTEPLASE 2 MG IJ SOLR: 2.0000 mg | Freq: Once | INTRAMUSCULAR | Status: DC | PRN

## 2016-09-09 MED ORDER — SENNOSIDES-DOCUSATE SODIUM 8.6-50 MG PO TABS: 1.0000 | ORAL_TABLET | Freq: Every evening | ORAL | Status: DC | PRN

## 2016-09-09 MED ORDER — AMLODIPINE BESYLATE 5 MG PO TABS
5.0000 mg | ORAL_TABLET | Freq: Every day | ORAL | Status: DC
  Administered 2016-09-09: 5 mg via ORAL
  Filled 2016-09-09: qty 1

## 2016-09-09 MED ORDER — VANCOMYCIN HCL IN DEXTROSE 1-5 GM/200ML-% IV SOLN
1000.0000 mg | Freq: Once | INTRAVENOUS | Status: DC
  Filled 2016-09-09: qty 200

## 2016-09-09 MED ORDER — ONDANSETRON HCL 4 MG PO TABS: 4.0000 mg | ORAL_TABLET | Freq: Four times a day (QID) | ORAL | Status: DC | PRN

## 2016-09-09 MED ORDER — HEPARIN SODIUM (PORCINE) 1000 UNIT/ML DIALYSIS
1000.0000 [IU] | INTRAMUSCULAR | Status: DC | PRN
  Filled 2016-09-09: qty 1

## 2016-09-09 MED ORDER — OXYCODONE HCL 5 MG PO TABS
5.0000 mg | ORAL_TABLET | ORAL | Status: DC | PRN
  Administered 2016-09-09 – 2016-09-10 (×3): 5 mg via ORAL
  Filled 2016-09-09 (×3): qty 1

## 2016-09-09 MED ORDER — DARBEPOETIN ALFA 100 MCG/0.5ML IJ SOSY
100.0000 ug | PREFILLED_SYRINGE | INTRAMUSCULAR | Status: DC
  Administered 2016-09-09: 100 ug via INTRAVENOUS

## 2016-09-09 MED ORDER — SODIUM CHLORIDE 0.9 % IV SOLN
1.0000 g | Freq: Once | INTRAVENOUS | Status: AC
  Administered 2016-09-09: 1 g via INTRAVENOUS
  Filled 2016-09-09: qty 10

## 2016-09-09 MED ORDER — CARVEDILOL 12.5 MG PO TABS
25.0000 mg | ORAL_TABLET | Freq: Two times a day (BID) | ORAL | Status: DC
Start: 2016-09-09 — End: 2016-09-10
  Administered 2016-09-09: 25 mg via ORAL
  Filled 2016-09-09: qty 2

## 2016-09-09 MED ORDER — ALTEPLASE 2 MG IJ SOLR
2.0000 mg | Freq: Once | INTRAMUSCULAR | Status: DC | PRN
  Filled 2016-09-09: qty 2

## 2016-09-09 MED ORDER — HEPARIN SODIUM (PORCINE) 5000 UNIT/ML IJ SOLN
5000.0000 [IU] | Freq: Three times a day (TID) | INTRAMUSCULAR | Status: DC
  Administered 2016-09-09 (×3): 5000 [IU] via SUBCUTANEOUS
  Filled 2016-09-09 (×4): qty 1

## 2016-09-09 MED ORDER — INSULIN GLARGINE 100 UNIT/ML ~~LOC~~ SOLN
18.0000 [IU] | Freq: Every day | SUBCUTANEOUS | Status: DC
  Administered 2016-09-09 (×2): 18 [IU] via SUBCUTANEOUS
  Filled 2016-09-09 (×3): qty 0.18

## 2016-09-09 NOTE — Procedures (Signed)
Tolerating HD.  No instability at current time. Has R IJ TDC and will see if AV access can be placed while she is an inpatient. Jasher Barkan C

## 2016-09-09 NOTE — Progress Notes (Addendum)
CRITICAL VALUE ALERT  Critical value received:  Calcium 6.4  Date of notification:  09/09/16  Time of notification:  3276  Critical value read back:Yes.    Nurse who received alert:  Margarita Rana, RN  MD notified (1st page):  C. Danford  Time of first page:  0440  MD notified (2nd page):  Time of second page:  Responding MD:  C. Danford  Time MD responded:  0441  BMP and calcium gluconate ordered. Fortino Sic, RN, BSN 09/09/2016 4:47 AM

## 2016-09-09 NOTE — Consult Note (Signed)
[facility] Renal Consultation Note    Indication for Consultation:  Management of ESRD/hemodialysis, anemia, hypertension/volume, and secondary hyperparathyroidism. PCP:  HPI: Karen [name] is a 40 y.o. female with ESRD, HTN, CAD, and DM who was admitted with pneumonia which failed outpatient PO antibiotic treatment. Came in yesterday with worsened cough, dyspnea, R back pain, and weakness. CXR here confirmed RLL pneumonia. Denies CP or abdominal pain, having low appetite and nausea. No fever or edema.  Usually dialyzes TTS at [facility] clinic. Missed HD yesterday due to ED evaluation. Last HD 12/16. We will arrange for dialysis today.  Past Medical History:  Diagnosis Date  . Anemia   . Anxiety   . Exertional shortness of breath    "recently; it's fluid" (02/02/2013)  . GERD (gastroesophageal reflux disease)   . History of blood transfusion    "last week" (02/02/2013)  . Hypertension   . Ischemic cardiomyopathy    by echo 2014  . Osteomyelitis of toe of left foot ([place])    "off and on since 2009; no OR" (02/02/2013)  . Renal failure    acute vs chronic/notes 02/02/2013  . Type II diabetes mellitus ([place]) 1995   Past Surgical History:  Procedure Laterality Date  . AMPUTATION Left 02/06/2013   Procedure: AMPUTATION LEFT GREAT TOE;  Surgeon: Wylene Simmer, MD;  Location: [place];  Service: Orthopedics;  Laterality: Left;  . AMPUTATION Left 06/24/2013   Procedure: AMPUTATION BELOW KNEE ;  Surgeon: Wylene Simmer, MD;  Location: [place];  Service: Orthopedics;  Laterality: Left;  . CESAREAN SECTION  10/18/2006  . INSERTION OF DIALYSIS CATHETER     Family History  Problem Relation Age of Onset  . Hypertension Mother   . Diabetes Mother   . Diabetes Father    Social History:  reports that she quit smoking about 11 years ago. Her smoking use included Cigarettes. She has a 30.00 pack-year smoking history. She has never used smokeless tobacco. She reports that she does not drink  alcohol or use drugs.  ROS: As per HPI otherwise negative.  Physical Exam: Vitals:   09/08/16 2318 09/09/16 0020 09/09/16 0436 09/09/16 1041  BP: 157/90 (!) 161/94 139/62 (!) 161/95  Pulse: 101 (!) 104 (!) 102 (!) 101  Resp: 18 20 20 19   Temp: 98.6 F (37 C) 98.8 F (37.1 C) 98 F (36.7 C) 98.4 F (36.9 C)  TempSrc: Oral Oral Oral Oral  SpO2: 99% 100% 99% 100%  Weight:  101.6 kg (223 lb 14.4 oz)    Height:  5\' 6"  (1.676 m)       General: Well developed, well nourished, in no acute distress. Head: Normocephalic, atraumatic, sclera non-icteric, mucus membranes are moist. Neck: Supple without lymphadenopathy/masses. JVD not elevated. Lungs: Clear in upper lobes, coarse air movement in RLL. Heart: RRR with normal S1, S2. No murmurs, rubs, or gallops appreciated. Abdomen: Soft, non-tender, non-distended with normoactive bowel sounds. No rebound/guarding.. Musculoskeletal:  Strength and tone appear normal for age. Lower extremities: L BKA without stump edema, no RLE edema. Neuro: Alert and oriented X 3. Moves all extremities spontaneously. Psych:  Responds to questions appropriately with a normal affect. Dialysis Access: PC in R chest without tenderness.  Allergies  Allergen Reactions  . Morphine And Related Hives and Rash  . Novolog [Insulin Aspart] Other (See Comments)    Cramps/ Gi distress  . Penicillins Hives and Rash    Has patient had a PCN reaction causing immediate rash, facial/tongue/throat swelling, SOB  or lightheadedness with hypotension: No Has patient had a PCN reaction causing severe rash involving mucus membranes or skin necrosis: No Has patient had a PCN reaction that required hospitalization No Has patient had a PCN reaction occurring within the last 10 years: No If all of the above answers are "NO", then may proceed with Cephalosporin use.    Prior to Admission medications   Medication Sig Start Date End Date Taking? Authorizing Provider  acetaminophen  (TYLENOL) 500 MG tablet Take 1,000 mg by mouth every 6 (six) hours as needed.   Yes Historical Provider, MD  amLODipine (NORVASC) 5 MG tablet Take 1 tablet (5 mg total) by mouth daily. 05/29/16  Yes Oswald Hillock, MD  Aspirin-Salicylamide-Caffeine (BC FAST PAIN RELIEF) [phone] MG PACK Take 1 Package by mouth daily as needed (pain).   Yes Historical Provider, MD  carvedilol (COREG) 25 MG tablet Take 25 mg by mouth 2 (two) times daily with a meal.  01/14/16  Yes Historical Provider, MD  insulin glargine (LANTUS) 100 UNIT/ML injection Inject 18 Units into the skin at bedtime.   Yes Historical Provider, MD  insulin lispro (HUMALOG) 100 UNIT/ML injection Inject 2-5 Units into the skin every evening. Per sliding scale   Yes Historical Provider, MD   Current Facility-Administered Medications  Medication Dose Route Frequency Provider Last Rate Last Dose  . acetaminophen (TYLENOL) tablet 650 mg  650 mg Oral Q6H PRN Edwin Dada, MD       Or  . acetaminophen (TYLENOL) suppository 650 mg  650 mg Rectal Q6H PRN Edwin Dada, MD      . amLODipine (NORVASC) tablet 5 mg  5 mg Oral Daily Edwin Dada, MD   5 mg at 09/09/16 1102  . ceFEPIme (MAXIPIME) 1 g in dextrose 5 % 50 mL IVPB  1 g Intravenous Q24H Edwin Dada, MD 100 mL/hr at 09/08/16 2338 1 g at 09/08/16 2338  . heparin injection 5,000 Units  5,000 Units Subcutaneous Q8H Edwin Dada, MD   5,000 Units at 09/09/16 0604  . insulin aspart (novoLOG) injection 0-5 Units  0-5 Units Subcutaneous QHS Edwin Dada, MD      . insulin aspart (novoLOG) injection 0-9 Units  0-9 Units Subcutaneous TID WC Edwin Dada, MD   2 Units at 09/09/16 [phone]  . insulin glargine (LANTUS) injection 18 Units  18 Units Subcutaneous QHS Edwin Dada, MD   18 Units at 09/09/16 0057  . lidocaine (PF) (XYLOCAINE) 1 % injection           . ondansetron (ZOFRAN) tablet 4 mg  4 mg Oral Q6H PRN Edwin Dada, MD        Or  . ondansetron (ZOFRAN) injection 4 mg  4 mg Intravenous Q6H PRN Edwin Dada, MD   4 mg at 09/09/16 0449  . oxyCODONE (Oxy IR/ROXICODONE) immediate release tablet 5 mg  5 mg Oral Q4H PRN Edwin Dada, MD   5 mg at 09/09/16 0142  . senna-docusate (Senokot-S) tablet 1 tablet  1 tablet Oral QHS PRN Edwin Dada, MD       Labs: Basic Metabolic Panel:  Recent Labs Lab 09/08/16 1800 09/09/16 0301 09/09/16 0849  NA 128* 126* 129*  K 4.9 5.4* 4.8  CL 91* 91* 93*  CO2 20* 17* 21*  GLUCOSE 235* 203* 128*  BUN 47* 50* 49*  CREATININE 10.27* 10.21* 10.34*  CALCIUM 6.6* 6.4* 7.0*   Liver Function Tests:  Recent  Labs Lab 09/08/16 1800  AST 12*  ALT 12*  ALKPHOS 93  BILITOT 0.6  PROT 6.8  ALBUMIN 2.2*   CBC:  Recent Labs Lab 09/08/16 1800 09/09/16 0301  WBC 15.3* 15.8*  NEUTROABS 12.8*  --   HGB 9.2* 8.7*  HCT 29.0* 27.6*  MCV 81.0 80.7  PLT 238 283   CBG:  Recent Labs Lab 09/09/16 0038 09/09/16 0551  GLUCAP 197* 169*   Studies/Results: Dg Chest 2 View  Result Date: 09/08/2016 CLINICAL DATA:  40 year old female recently discharged from [facility] with pneumonia. Cough fever vomiting back pain. Initial encounter. EXAM: CHEST  2 VIEW COMPARISON:  [facility] portable chest 05/27/2016, and CT Abdomen and Pelvis 09/06/2016 FINDINGS: AP and lateral semi upright views at at 1847 hours. Stable right chest dual lumen dialysis type catheter. Stable cardiomegaly and mediastinal contours. Visualized tracheal air column is within normal limits. No pneumothorax, pulmonary edema, or pleural effusion. Confluent right lower lobe pulmonary opacity appears stable to that seen on the recent CT Abdomen and Pelvis. No pleural effusion. No acute osseous abnormality identified. IMPRESSION: Lateral basal segment right lower lobe pneumonia appears stable from the recent CT Abdomen and Pelvis. No pleural effusion or new cardiopulmonary abnormality.  Electronically Signed   By: Genevie Ann M.D.   On: 09/08/2016 19:20   Dialysis Orders:  TTS at [facility] 4 hours, 180dialyzer, BFR 400/DFR800, EDW 101kg, 2K/2.25Ca bath, PC - Heparin 6000 unit bolus + 2000unit mid-run bolus - Venofer 50mg  weekly - Mircera 159mcg IV q 2 weeks (last given 12/5) - Calcitriol 0.22mcg PO q HD  Assessment/Plan: 1.  Pneumonia (RLL): Failed oral abx therapy, now on Vanc/Cefepime. 2.  ESRD: Usually TTS schedule, missed last HD. Plan on HD today (12/20), then again tomorrow per usual TTS schedule. 3.  Hypertension/volume: BP slightly high. Continue current meds and EDW for now. 4.  Anemia: Hgb 8.7. Starting Aranesp 138mcg weekly, will give today. 5.  Metabolic bone disease: Ca low side, was given IV Ca. Monitor. Restart VDRA and will check Phos tomorrow. 6.  Nutrition: Alb 2.2, adding Pro-stat supps. 7. DM: On insulin. Per primary team.  Veneta Penton, PA-C 09/09/2016, 11:04 AM  [facility] Pager: [phone]  Renal Attending: Pt with ESRD and recent  PNA for inpt treatment after failed PO. Will get on back schedule TTS and see if AV access can be placed. Lenell Mcconnell C

## 2016-09-09 NOTE — Progress Notes (Signed)
Pharmacy Antibiotic Note Karen [name] is a 40 y.o. female admitted on 09/08/2016 with SOB and concern for PNA that did not respond to outpt Levaquin. Pharmacy dosing cefepime and vancomycin for PNA. Patient with ESRD for HD 12/20 then TTS.   Plan: Vancomycin  1g today then TTS qHD Cefepime 1g IV q24h Monitor culture data, HD plan and clinical course VT at SS prn  Height: 5\' 6"  (167.6 cm) Weight: 223 lb 14.4 oz (101.6 kg) IBW/kg (Calculated) : 59.3  Temp (24hrs), Avg:98.4 F (36.9 C), Min:98 F (36.7 C), Max:98.8 F (37.1 C)   Recent Labs Lab 09/08/16 0000 09/08/16 1800 09/08/16 1809 09/09/16 0301 09/09/16 0849  WBC  --  15.3*  --  15.8*  --   CREATININE  --  10.27*  --  10.21* 10.34*  LATICACIDVEN 0.9  --  1.19  --   --     Estimated Creatinine Clearance: 8.8 mL/min (by C-G formula based on SCr of 10.34 mg/dL (H)).    Allergies  Allergen Reactions  . Morphine And Related Hives and Rash  . Novolog [Insulin Aspart] Other (See Comments)    Cramps/ Gi distress  . Penicillins Hives and Rash    Has patient had a PCN reaction causing immediate rash, facial/tongue/throat swelling, SOB or lightheadedness with hypotension: No Has patient had a PCN reaction causing severe rash involving mucus membranes or skin necrosis: No Has patient had a PCN reaction that required hospitalization No Has patient had a PCN reaction occurring within the last 10 years: No If all of the above answers are "NO", then may proceed with Cephalosporin use.     Antimicrobials this admission: 12/19 vancomycin  >>  12/19 cefepime >>  Microbiology results:  12/19 MRSA PCR- neg  Hildred Laser, Pharm D 09/09/2016 1:16 PM

## 2016-09-09 NOTE — Progress Notes (Signed)
Patient ID: Karen [name], female   DOB: 1976/02/13, 40 y.o.   MRN: [id number]                                     Patient name: Karen [name] MRN: [id number] DOB: 11/30/1975 Sex: female  REASON FOR VISIT: Discuss access for hemodialysis  HPI: Karen [name] is a 40 y.o. female admitted with pneumonia. She currently has dialysis via a catheter. She's been seen in our office on an is been scheduled for right basilic vein transposition on several different occasions. This has not been accomplished due to multiple medical issues. She is currently on hemodialysis via her catheter. She reports that she has not had any difficulty with this and is anxious to get her catheter out.  Current Facility-Administered Medications  Medication Dose Route Frequency Provider Last Rate Last Dose  . 0.9 %  sodium chloride infusion  100 mL Intravenous PRN Loren Racer, PA-C      . 0.9 %  sodium chloride infusion  100 mL Intravenous PRN Loren Racer, PA-C      . acetaminophen (TYLENOL) tablet 650 mg  650 mg Oral Q6H PRN Edwin Dada, MD       Or  . acetaminophen (TYLENOL) suppository 650 mg  650 mg Rectal Q6H PRN Edwin Dada, MD      . alteplase (CATHFLO ACTIVASE) injection 2 mg  2 mg Intracatheter Once PRN Loren Racer, PA-C      . amLODipine (NORVASC) tablet 5 mg  5 mg Oral Daily Edwin Dada, MD   5 mg at 09/09/16 1102  . carvedilol (COREG) tablet 25 mg  25 mg Oral BID WC Jennifer Chahn-Yang Choi, DO      . ceFEPIme (MAXIPIME) 1 g in dextrose 5 % 50 mL IVPB  1 g Intravenous Q24H Edwin Dada, MD 100 mL/hr at 09/08/16 2338 1 g at 09/08/16 2338  . Darbepoetin Alfa (ARANESP) injection 100 mcg  100 mcg Intravenous Q Wed-HD Loren Racer, PA-C      . feeding supplement (PRO-STAT SUGAR FREE 64) liquid 30 mL  30 mL Oral BID Loren Racer, PA-C      . heparin injection 1,000 Units  1,000 Units Dialysis PRN Loren Racer, PA-C      . Derrill Memo ON 09/10/2016] heparin  injection 2,000 Units  20 Units/kg Dialysis PRN Loren Racer, PA-C      . heparin injection 5,000 Units  5,000 Units Subcutaneous Q8H Edwin Dada, MD   5,000 Units at 09/09/16 0604  . insulin aspart (novoLOG) injection 0-5 Units  0-5 Units Subcutaneous QHS Edwin Dada, MD      . insulin aspart (novoLOG) injection 0-9 Units  0-9 Units Subcutaneous TID WC Edwin Dada, MD   2 Units at 09/09/16 [phone]  . insulin glargine (LANTUS) injection 18 Units  18 Units Subcutaneous QHS Edwin Dada, MD   18 Units at 09/09/16 0057  . ondansetron (ZOFRAN) tablet 4 mg  4 mg Oral Q6H PRN Edwin Dada, MD       Or  . ondansetron (ZOFRAN) injection 4 mg  4 mg Intravenous Q6H PRN Edwin Dada, MD   4 mg at 09/09/16 0449  . oxyCODONE (Oxy IR/ROXICODONE) immediate release tablet 5 mg  5 mg Oral Q4H PRN Edwin Dada, MD   5 mg at  09/09/16 0142  . senna-docusate (Senokot-S) tablet 1 tablet  1 tablet Oral QHS PRN Edwin Dada, MD         PHYSICAL EXAM: Vitals:   09/09/16 1348 09/09/16 1430 09/09/16 1500 09/09/16 1530  BP: (!) 158/94 (!) 164/98 (!) 160/97 (!) 158/92  Pulse: (!) 106 (!) 106 (!) 108 (!) 104  Resp:      Temp:      TempSrc:      SpO2:      Weight:      Height:        GENERAL: The patient is a well-nourished female, in no acute distress. The vital signs are documented above. Palpable right radial pulse. She does have very small surface veins  MEDICAL ISSUES: Incisions disease with need for permanent access. Discussed this with patient. Could potentially do this while an inpatient but more likely would be rescheduled as an outpatient.   Rosetta Posner, MD FACS [facility] Tel [phone] Pager [phone]

## 2016-09-09 NOTE — Progress Notes (Signed)
PROGRESS NOTE    Karen [name]  XIP:[id number] DOB: 04/22/1976 DOA: 09/08/2016 PCP: Harvie Junior, MD     Brief Narrative:  Karen [name] is a 40 y.o. female with a past medical history significant for ESRD on HD TThS, IDDM, recurrent osteomyelitis now s/p L BKA, HTN and ischemic CM with EF 40-45% who presents with 1 day cough, right back pain, vomiting, malaise. She was seen in the emergency room on Sunday at [facility], because of flank pain a CT of the abdomen and pelvis was obtained that showed right lower lobe pneumonia, she was started on oral Levaquin and discharged. Incidentally, on the same day her son was evaluated for similar symptoms, diagnosed with pneumonia and admitted to the hospital at [facility].   Assessment & Plan:   Principal Problem:   Right lower lobe pneumonia ([place]) Active Problems:   Essential hypertension   Cardiomyopathy, ischemic - EF 45-50% with inf WMA by 2D 02/05/13   S/P BKA (below knee amputation) unilateral ([place])   Uncontrolled type 2 diabetes mellitus with chronic kidney disease on chronic dialysis, with long-term current use of insulin ([facility])   End-stage renal disease on hemodialysis ([place])   Anemia in other chronic diseases classified elsewhere   High anion gap metabolic acidosis   RLL HCAP -Recently has been taking care of son who has been admitted at hospital with pneumonia. Failed outpatient levaquin -Continue cefepime  -Strep pneumo antigen negative, blood cultures pending -Check Legionella antigen  ESRD on hemodialysis Tuesday, Thursday, Saturday. -Nephrology following. Dialysis today and tomorrow  Chronic hyponatremia -Stable   Anemia of renal disease -Stable  Essential hypertension. -Continue amlodipine, carvedilol  Ischemic cardiomyopathy -EF 40-45% -Stable  Diabetes mellitus type 2, uncontrolled with complications including ESRD and osteomyelitis -Continue Lantus, SSI   DVT prophylaxis: subq hep Code Status:  Full Family Communication: no family at bedside Disposition Plan: pending further improvement   Consultants:   Nephrology  Procedures:   None  Antimicrobials:   Vanco 12/19-12/20  Cefepime 12/19 >>>    Subjective: Patient states that since being in the hospital, she feels much better. She denies any fevers, chills, chest pain, cough, shortness of breath.  Objective: Vitals:   09/08/16 2318 09/09/16 0020 09/09/16 0436 09/09/16 1041  BP: 157/90 (!) 161/94 139/62 (!) 161/95  Pulse: 101 (!) 104 (!) 102 (!) 101  Resp: 18 20 20 19   Temp: 98.6 F (37 C) 98.8 F (37.1 C) 98 F (36.7 C) 98.4 F (36.9 C)  TempSrc: Oral Oral Oral Oral  SpO2: 99% 100% 99% 100%  Weight:  101.6 kg (223 lb 14.4 oz)    Height:  5\' 6"  (1.676 m)     No intake or output data in the 24 hours ending 09/09/16 1318 Filed Weights   09/09/16 0020  Weight: 101.6 kg (223 lb 14.4 oz)    Examination:  General exam: Appears calm and comfortable  Respiratory system: Clear to auscultation. Respiratory effort normal. Cardiovascular system: S1 & S2 heard, RRR. No JVD, murmurs, rubs, gallops or clicks. No pedal edema. Gastrointestinal system: Abdomen is nondistended, soft and nontender. No organomegaly or masses felt. Normal bowel sounds heard. Central nervous system: Alert and oriented. No focal neurological deficits. Extremities: +Left lower leg status post BKA Skin: No rashes, lesions or ulcers on exposed skin Psychiatry: Judgement and insight appear normal. Mood & affect appropriate.   Data Reviewed: I have personally reviewed following labs and imaging studies  CBC:  Recent Labs Lab 09/08/16 1800  09/09/16 0301  WBC 15.3* 15.8*  NEUTROABS 12.8*  --   HGB 9.2* 8.7*  HCT 29.0* 27.6*  MCV 81.0 80.7  PLT 238 865   Basic Metabolic Panel:  Recent Labs Lab 09/08/16 1800 09/09/16 0301 09/09/16 0849  NA 128* 126* 129*  K 4.9 5.4* 4.8  CL 91* 91* 93*  CO2 20* 17* 21*  GLUCOSE 235* 203* 128*   BUN 47* 50* 49*  CREATININE 10.27* 10.21* 10.34*  CALCIUM 6.6* 6.4* 7.0*   GFR: Estimated Creatinine Clearance: 8.8 mL/min (by C-G formula based on SCr of 10.34 mg/dL (H)). Liver Function Tests:  Recent Labs Lab 09/08/16 1800  AST 12*  ALT 12*  ALKPHOS 93  BILITOT 0.6  PROT 6.8  ALBUMIN 2.2*   No results for input(s): LIPASE, AMYLASE in the last 168 hours. No results for input(s): AMMONIA in the last 168 hours. Coagulation Profile: No results for input(s): INR, PROTIME in the last 168 hours. Cardiac Enzymes: No results for input(s): CKTOTAL, CKMB, CKMBINDEX, TROPONINI in the last 168 hours. BNP (last 3 results) No results for input(s): PROBNP in the last 8760 hours. HbA1C: No results for input(s): HGBA1C in the last 72 hours. CBG:  Recent Labs Lab 09/09/16 0038 09/09/16 0551 09/09/16 1142  GLUCAP 197* 169* 88   Lipid Profile: No results for input(s): CHOL, HDL, LDLCALC, TRIG, CHOLHDL, LDLDIRECT in the last 72 hours. Thyroid Function Tests: No results for input(s): TSH, T4TOTAL, FREET4, T3FREE, THYROIDAB in the last 72 hours. Anemia Panel: No results for input(s): VITAMINB12, FOLATE, FERRITIN, TIBC, IRON, RETICCTPCT in the last 72 hours. Sepsis Labs:  Recent Labs Lab 09/08/16 0000 09/08/16 1809  LATICACIDVEN 0.9 1.19    Recent Results (from the past 240 hour(s))  MRSA PCR Screening     Status: None   Collection Time: 09/08/16 12:38 AM  Result Value Ref Range Status   MRSA by PCR NEGATIVE NEGATIVE Final    Comment:        The GeneXpert MRSA Assay (FDA approved for NASAL specimens only), is one component of a comprehensive MRSA colonization surveillance program. It is not intended to diagnose MRSA infection nor to guide or monitor treatment for MRSA infections.        Radiology Studies: Dg Chest 2 View  Result Date: 09/08/2016 CLINICAL DATA:  40 year old female recently discharged from [facility] with pneumonia. Cough fever vomiting  back pain. Initial encounter. EXAM: CHEST  2 VIEW COMPARISON:  [facility] portable chest 05/27/2016, and CT Abdomen and Pelvis 09/06/2016 FINDINGS: AP and lateral semi upright views at at 1847 hours. Stable right chest dual lumen dialysis type catheter. Stable cardiomegaly and mediastinal contours. Visualized tracheal air column is within normal limits. No pneumothorax, pulmonary edema, or pleural effusion. Confluent right lower lobe pulmonary opacity appears stable to that seen on the recent CT Abdomen and Pelvis. No pleural effusion. No acute osseous abnormality identified. IMPRESSION: Lateral basal segment right lower lobe pneumonia appears stable from the recent CT Abdomen and Pelvis. No pleural effusion or new cardiopulmonary abnormality. Electronically Signed   By: Genevie Ann M.D.   On: 09/08/2016 19:20      Scheduled Meds: . amLODipine  5 mg Oral Daily  . ceFEPime (MAXIPIME) IV  1 g Intravenous Q24H  . darbepoetin (ARANESP) injection - DIALYSIS  100 mcg Intravenous Q Wed-HD  . feeding supplement (PRO-STAT SUGAR FREE 64)  30 mL Oral BID  . heparin  5,000 Units Subcutaneous Q8H  . insulin aspart  0-5 Units  Subcutaneous QHS  . insulin aspart  0-9 Units Subcutaneous TID WC  . insulin glargine  18 Units Subcutaneous QHS  . vancomycin  1,000 mg Intravenous Once  . [START ON 09/10/2016] vancomycin  1,000 mg Intravenous Q T,Th,Sa-HD   Continuous Infusions:   LOS: 1 day    Time spent: 40 minutes   Dessa Phi, DO [facility] [email or web address] Password [email or web address] 09/09/2016, 1:18 PM

## 2016-09-10 LAB — CBC WITH DIFFERENTIAL/PLATELET
BASOS ABS: 0 10*3/uL (ref 0.0–0.1)
Basophils Relative: 0 %
EOS ABS: 0.4 10*3/uL (ref 0.0–0.7)
EOS PCT: 3 %
HCT: 26.3 % — ABNORMAL LOW (ref 36.0–46.0)
Hemoglobin: 8.1 g/dL — ABNORMAL LOW (ref 12.0–15.0)
LYMPHS ABS: 1.8 10*3/uL (ref 0.7–4.0)
Lymphocytes Relative: 14 %
MCH: 25.5 pg — AB (ref 26.0–34.0)
MCHC: 30.8 g/dL (ref 30.0–36.0)
MCV: 82.7 fL (ref 78.0–100.0)
MONO ABS: 1.3 10*3/uL — AB (ref 0.1–1.0)
Monocytes Relative: 10 %
Neutro Abs: 9.8 10*3/uL — ABNORMAL HIGH (ref 1.7–7.7)
Neutrophils Relative %: 73 %
PLATELETS: 318 10*3/uL (ref 150–400)
RBC: 3.18 MIL/uL — AB (ref 3.87–5.11)
RDW: 19 % — AB (ref 11.5–15.5)
WBC: 13.3 10*3/uL — AB (ref 4.0–10.5)

## 2016-09-10 LAB — HEPATITIS B SURFACE ANTIGEN: Hepatitis B Surface Ag: NEGATIVE

## 2016-09-10 LAB — BASIC METABOLIC PANEL
Anion gap: 7 (ref 5–15)
BUN: 15 mg/dL (ref 6–20)
CALCIUM: 7.1 mg/dL — AB (ref 8.9–10.3)
CO2: 30 mmol/L (ref 22–32)
Chloride: 94 mmol/L — ABNORMAL LOW (ref 101–111)
Creatinine, Ser: 5.24 mg/dL — ABNORMAL HIGH (ref 0.44–1.00)
GFR calc Af Amer: 11 mL/min — ABNORMAL LOW (ref >60)
GFR, EST NON AFRICAN AMERICAN: 9 mL/min — AB (ref >60)
GLUCOSE: 96 mg/dL (ref 65–99)
POTASSIUM: 4.1 mmol/L (ref 3.5–5.1)
SODIUM: 131 mmol/L — AB (ref 135–145)

## 2016-09-10 LAB — PROCALCITONIN: PROCALCITONIN: 7.43 ng/mL

## 2016-09-10 LAB — GLUCOSE, CAPILLARY: GLUCOSE-CAPILLARY: 114 mg/dL — AB (ref 65–99)

## 2016-09-10 MED ORDER — CEFPODOXIME PROXETIL 200 MG PO TABS: 200.0000 mg | ORAL_TABLET | ORAL | 0 refills | Status: AC

## 2016-09-10 MED ORDER — CEFPODOXIME PROXETIL 200 MG PO TABS
200.0000 mg | ORAL_TABLET | ORAL | Status: DC
  Administered 2016-09-10: 200 mg via ORAL
  Filled 2016-09-10: qty 1

## 2016-09-10 NOTE — Progress Notes (Signed)
Lab attempted to draw blood from patient, patient requesting butterfly needle which lab tech does not stock on cart.  This is a timed draw, so I tried to educate patient that we had the same size needle as a butterfly, it is just in a different casing....patient started yelling at me saying, "you do have butterfly needles in this hospital," I tried to reeducate about needle size and patient told me, "to get out of her room.you stupid bitch.  I explained to the patient she was not going to disrespect me by calling me names, that I am her nures and I am here to take care of her, and I would appreciate it if she could tone down her comments. To which the patient replied, "I don't give a damn what you think!" Nurse left patients room to let patient calm down and will try to re approach at a different time.

## 2016-09-10 NOTE — Progress Notes (Signed)
Pharmacy Antibiotic Note  Karen [name] is a 40 y.o. female admitted on 09/08/2016 with pneumonia.  Pharmacy has been consulted for Cefpodoxime dosing.  Pt is to transition to po antibiotics today.  ESRD-TTS dialysis.  Plan: Cefpodoxime 200mg  po qTTS, after dialysis F/U culture data Trend clinical status  Height: 5\' 6"  (167.6 cm) Weight: 216 lb 14.9 oz (98.4 kg) IBW/kg (Calculated) : 59.3  Temp (24hrs), Avg:98.7 F (37.1 C), Min:98.4 F (36.9 C), Max:99.2 F (37.3 C)   Recent Labs Lab 09/08/16 0000 09/08/16 1800 09/08/16 1809 09/09/16 0301 09/09/16 0849 09/10/16 0729  WBC  --  15.3*  --  15.8*  --  13.3*  CREATININE  --  10.27*  --  10.21* 10.34* 5.24*  LATICACIDVEN 0.9  --  1.19  --   --   --     Estimated Creatinine Clearance: 17 mL/min (by C-G formula based on SCr of 5.24 mg/dL (H)).    Allergies  Allergen Reactions  . Morphine And Related Hives and Rash  . Novolog [Insulin Aspart] Other (See Comments)    Cramps/ Gi distress  . Penicillins Hives and Rash    Has patient had a PCN reaction causing immediate rash, facial/tongue/throat swelling, SOB or lightheadedness with hypotension: No Has patient had a PCN reaction causing severe rash involving mucus membranes or skin necrosis: No Has patient had a PCN reaction that required hospitalization No Has patient had a PCN reaction occurring within the last 10 years: No If all of the above answers are "NO", then may proceed with Cephalosporin use.     Antimicrobials this admission:  12/19 vancomycin  >>  12/20 12/19 cefepime>> 12/21 12/19 Azactam x1 dose  12/21 Cefpodoxime  >>  Microbiology results:  12/19 MRSA PCR- neg 12/20 blood ntd  Thank you for allowing pharmacy to be a part of this patient's care.   Gracy Bruins, PharmD Clinical Pharmacist [facility]

## 2016-09-10 NOTE — Progress Notes (Signed)
Patient is discharged from room 5C17 at this time. Alert and in stable condition. IV site d/c'd and instructions read to patient with all questions answered. Left unit via wheelchair with all belongings at side.

## 2016-09-10 NOTE — Discharge Instructions (Signed)
Recommendations for Outpatient Follow-up:  1. Follow up with PCP in 1-2 weeks 2. Follow up with HD T/Th/Sat as usual 3. Please obtain BMP/CBC in one week  4. Please follow up on the following pending results: blood culture results

## 2016-09-10 NOTE — Procedures (Signed)
Tolerating hemodialysis.  Goal 1500cc today with yellow light.  Feels better. Ralphine Hinks C

## 2016-09-10 NOTE — Care Management Note (Signed)
Case Management Note  Patient Details  Name: Karen [name] MRN: [id number] Date of Birth: 25-Sep-1975  Subjective/Objective:                    Action/Plan: Pt discharging home with her family. No further needs per CM.   Expected Discharge Date:                  Expected Discharge Plan:  Home/Self Care  In-House Referral:     Discharge planning Services     Post Acute Care Choice:    Choice offered to:     DME Arranged:    DME Agency:     HH Arranged:    [place] Agency:     Status of Service:  Completed, signed off  If discussed at [employer] of Stay Meetings, dates discussed:    Additional Comments:  Pollie Friar, RN 09/10/2016, 1:42 PM

## 2016-09-10 NOTE — Discharge Summary (Signed)
Physician Discharge Summary  Karen [name] JKK:[id number] DOB: 1976/09/10 DOA: 09/08/2016  PCP: Harvie Junior, MD  Admit date: 09/08/2016 Discharge date: 09/10/2016  Admitted From: Home Disposition:  Home  Recommendations for Outpatient Follow-up:  1. Follow up with PCP in 1-2 weeks 2. Follow up with HD T/Th/Sat as usual 3. Please obtain BMP/CBC in one week  4. Please follow up on the following pending results: blood culture results   Home Health: No  Equipment/Devices: None   Discharge Condition: Stable CODE STATUS: Full  Diet recommendation: Renal   Brief/Interim Summary: Karen [name] a 40 y.o.femalewith a past medical history significant for ESRD on HD TThS, IDDM, recurrent osteomyelitis now s/p L BKA, HTN and ischemic CM with EF 40-45%who presents with 1 day cough, right back pain, vomiting, malaise. She was seen in the emergency room on Sunday at [facility], because of flank pain a CT of the abdomen and pelvis was obtained that showed right lower lobe pneumonia, she was started on oral Levaquin and discharged. Incidentally, on the same day her son was evaluated for similar symptoms, diagnosed with pneumonia and admitted to the hospital at [facility]. Patient was treated for right lower lobe hospital-acquired pneumonia (due to visiting the hospital when her son was hospitalized) with vancomycin and cefepime. She continued to improve, and her antibiotic was the escalated to cefepime and then vantin (dose adjusted per pharmacy). Nephrology was consulted and patient underwent dialysis on 12/20 as well as 12/21.  Subjective on day of discharge: Doing well this morning. States that she feels much better since being in the hospital. Denies any fevers, chest pain, shortness of breath, cough. She has been afebrile and her leukocytosis is improving.  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 Failed outpatient levaquin -Continue cefepime, will deescalate to vantin on discharge  -Strep pneumo antigen negative, blood cultures pending. Follow up on blood cultures after discharge.   ESRD on hemodialysis Tuesday, Thursday, Saturday. -Nephrology following. Dialysis 12/20, 12/21  Chronic hyponatremia -Stable   Anemia of renal disease -Stable  Essential hypertension -Continue amlodipine, carvedilol  Ischemic cardiomyopathy -EF 40-45% -Stable  Diabetes mellitus type 2, uncontrolled with complications including ESRD and osteomyelitis -Continue Lantus, SSI  Discharge Instructions  Discharge Instructions    Call MD for:  difficulty breathing, headache or visual disturbances    Complete by:  As directed    Call MD for:  temperature >100.4    Complete by:  As directed    Increase activity slowly    Complete by:  As directed      Allergies as of 09/10/2016      Reactions   Morphine And Related Hives, Rash   Novolog [insulin Aspart] Other (See Comments)   Cramps/ Gi distress   Penicillins Hives, Rash   Has patient had a PCN reaction causing immediate rash, facial/tongue/throat swelling, SOB or lightheadedness with hypotension: No Has patient had a PCN reaction causing  severe rash involving mucus membranes or skin necrosis: No Has patient had a PCN reaction that required hospitalization No Has patient had a PCN reaction occurring within the last 10 years: No If all of the above answers  are "NO", then may proceed with Cephalosporin use. 09/10/16- tolerated Cefepime      Medication List    TAKE these medications   acetaminophen 500 MG tablet Commonly known as:  TYLENOL Take 1,000 mg by mouth every 6 (six) hours as needed.   amLODipine 5 MG tablet Commonly known as:  NORVASC Take 1 tablet (5 mg total) by mouth daily.   BC FAST PAIN RELIEF 650-195-33.3 MG Pack Generic drug:  Aspirin-Salicylamide-Caffeine Take 1 Package by mouth daily as needed (pain).   carvedilol 25 MG tablet Commonly known as:  COREG Take 25 mg by mouth 2 (two) times daily with a meal.   cefpodoxime 200 MG tablet Commonly known as:  VANTIN Take 1 tablet (200 mg total) by mouth Every Tuesday,Thursday,and Saturday with dialysis.   insulin glargine 100 UNIT/ML injection Commonly known as:  LANTUS Inject 18 Units into the skin at bedtime.   insulin lispro 100 UNIT/ML injection Commonly known as:  HUMALOG Inject 2-5 Units into the skin every evening. Per sliding scale      Follow-up Information    Harvie Junior, MD. Schedule an appointment as soon as possible for a visit in 1 week(s).   Specialty:  Family Medicine Contact information: [place] [address] [phone]          Allergies  Allergen Reactions  . Morphine And Related Hives and Rash  . Novolog [Insulin Aspart] Other (See Comments)    Cramps/ Gi distress  . Penicillins Hives and Rash    Has patient had a PCN reaction causing immediate rash, facial/tongue/throat swelling, SOB or lightheadedness with hypotension: No Has patient had a PCN reaction causing severe rash involving mucus membranes or skin necrosis: No Has patient had a PCN reaction that required hospitalization No Has patient had a PCN reaction occurring within the last 10 years: No If all of the above answers are "NO", then may proceed with Cephalosporin use. 09/10/16- tolerated Cefepime     Consultations:  Nephrology     Procedures/Studies: Dg Chest 2 View  Result Date: 09/08/2016 CLINICAL DATA:  40 year old female recently discharged from [facility] with pneumonia. Cough fever vomiting back pain. Initial encounter. EXAM: CHEST  2 VIEW COMPARISON:  [facility] portable chest 05/27/2016, and CT Abdomen and Pelvis 09/06/2016 FINDINGS: AP and lateral semi upright views at at 1847 hours. Stable right chest dual lumen dialysis type catheter. Stable cardiomegaly and mediastinal contours. Visualized tracheal air column is within normal limits. No pneumothorax, pulmonary edema, or pleural effusion. Confluent right lower lobe pulmonary opacity appears stable to that seen on the recent CT Abdomen and Pelvis. No pleural effusion. No acute osseous abnormality identified. IMPRESSION: Lateral basal segment right lower lobe pneumonia appears stable from the recent CT Abdomen and Pelvis. No pleural effusion or new cardiopulmonary abnormality. Electronically Signed   By: Genevie Ann M.D.   On: 09/08/2016 19:20       Discharge Exam: Vitals:   09/10/16 1000 09/10/16 1030  BP: (!) 147/78 133/80  Pulse: 60 93  Resp:    Temp:     Vitals:   09/10/16 0859 09/10/16 0930 09/10/16 1000 09/10/16 1030  BP: 127/78 (!) 121/91 (!) 147/78 133/80  Pulse: 83 82 60 93  Resp: 16  Temp:      TempSrc:      SpO2:      Weight:      Height:        General: Pt is alert, awake, not in acute distress Cardiovascular: RRR, S1/S2 +, no rubs, no gallops, +right chest wall with tunneled dialysis catheter  Respiratory: CTA bilaterally, no wheezing, no rhonchi Abdominal: Soft, NT, ND, bowel sounds + Extremities: no edema, no cyanosis, left leg s/p BKA     The results of significant diagnostics from this hospitalization (including imaging, microbiology, ancillary and laboratory) are listed below for reference.     Microbiology: Recent Results (from the past 240 hour(s))  MRSA PCR Screening     Status: None   Collection Time:  09/08/16 12:38 AM  Result Value Ref Range Status   MRSA by PCR NEGATIVE NEGATIVE Final    Comment:        The GeneXpert MRSA Assay (FDA approved for NASAL specimens only), is one component of a comprehensive MRSA colonization surveillance program. It is not intended to diagnose MRSA infection nor to guide or monitor treatment for MRSA infections.      Labs: BNP (last 3 results) No results for input(s): BNP in the last 8760 hours. Basic Metabolic Panel:  Recent Labs Lab 09/08/16 1800 09/09/16 0301 09/09/16 0849 09/10/16 0729  NA 128* 126* 129* 131*  K 4.9 5.4* 4.8 4.1  CL 91* 91* 93* 94*  CO2 20* 17* 21* 30  GLUCOSE 235* 203* 128* 96  BUN 47* 50* 49* 15  CREATININE 10.27* 10.21* 10.34* 5.24*  CALCIUM 6.6* 6.4* 7.0* 7.1*   Liver Function Tests:  Recent Labs Lab 09/08/16 1800  AST 12*  ALT 12*  ALKPHOS 93  BILITOT 0.6  PROT 6.8  ALBUMIN 2.2*   No results for input(s): LIPASE, AMYLASE in the last 168 hours. No results for input(s): AMMONIA in the last 168 hours. CBC:  Recent Labs Lab 09/08/16 1800 09/09/16 0301 09/10/16 0729  WBC 15.3* 15.8* 13.3*  NEUTROABS 12.8*  --  9.8*  HGB 9.2* 8.7* 8.1*  HCT 29.0* 27.6* 26.3*  MCV 81.0 80.7 82.7  PLT 238 283 318   Cardiac Enzymes: No results for input(s): CKTOTAL, CKMB, CKMBINDEX, TROPONINI in the last 168 hours. BNP: Invalid input(s): POCBNP CBG:  Recent Labs Lab 09/09/16 0038 09/09/16 0551 09/09/16 1142 09/09/16 2111 09/10/16 0614  GLUCAP 197* 169* 88 182* 114*   D-Dimer No results for input(s): DDIMER in the last 72 hours. Hgb A1c No results for input(s): HGBA1C in the last 72 hours. Lipid Profile No results for input(s): CHOL, HDL, LDLCALC, TRIG, CHOLHDL, LDLDIRECT in the last 72 hours. Thyroid function studies No results for input(s): TSH, T4TOTAL, T3FREE, THYROIDAB in the last 72 hours.  Invalid input(s): FREET3 Anemia work up No results for input(s): VITAMINB12, FOLATE, FERRITIN,  TIBC, IRON, RETICCTPCT in the last 72 hours. Urinalysis    Component Value Date/Time   COLORURINE YELLOW 09/09/2016 [place] 09/09/2016 0554   LABSPEC 1.015 09/09/2016 0554   PHURINE 7.0 09/09/2016 0554   GLUCOSEU >=500 (A) 09/09/2016 0554   HGBUR NEGATIVE 09/09/2016 0554   BILIRUBINUR NEGATIVE 09/09/2016 0554   KETONESUR NEGATIVE 09/09/2016 0554   PROTEINUR >=300 (A) 09/09/2016 0554   UROBILINOGEN 0.2 02/03/2013 0038   NITRITE NEGATIVE 09/09/2016 0554   LEUKOCYTESUR NEGATIVE 09/09/2016 0554   Sepsis Labs Invalid input(s): PROCALCITONIN,  WBC,  LACTICIDVEN Microbiology Recent Results (from the past 240 hour(s))  MRSA PCR  Screening     Status: None   Collection Time: 09/08/16 12:38 AM  Result Value Ref Range Status   MRSA by PCR NEGATIVE NEGATIVE Final    Comment:        The GeneXpert MRSA Assay (FDA approved for NASAL specimens only), is one component of a comprehensive MRSA colonization surveillance program. It is not intended to diagnose MRSA infection nor to guide or monitor treatment for MRSA infections.      Time coordinating discharge: Over 30 minutes  SIGNED:  Dessa Phi, DO [facility] Pager [phone]  If 7PM-7AM, please contact night-coverage [email or web address] Password [facility] 09/10/2016, 10:58 AM

## 2016-09-14 LAB — CULTURE, BLOOD (ROUTINE X 2)
Culture: NO GROWTH
Culture: NO GROWTH

## 2016-09-21 DIAGNOSIS — J189 Pneumonia, unspecified organism: Secondary | ICD-10-CM

## 2016-09-21 HISTORY — DX: Pneumonia, unspecified organism: J18.9

## 2016-09-25 DIAGNOSIS — G8929 Other chronic pain: Secondary | ICD-10-CM | POA: Diagnosis not present

## 2016-09-25 DIAGNOSIS — E119 Type 2 diabetes mellitus without complications: Secondary | ICD-10-CM | POA: Diagnosis not present

## 2016-09-25 DIAGNOSIS — Z992 Dependence on renal dialysis: Secondary | ICD-10-CM | POA: Diagnosis not present

## 2016-09-25 DIAGNOSIS — I10 Essential (primary) hypertension: Secondary | ICD-10-CM | POA: Diagnosis not present

## 2016-09-25 DIAGNOSIS — N186 End stage renal disease: Secondary | ICD-10-CM | POA: Diagnosis not present

## 2016-10-03 ENCOUNTER — Encounter (HOSPITAL_COMMUNITY): Payer: Self-pay | Admitting: Emergency Medicine

## 2016-10-03 ENCOUNTER — Emergency Department (HOSPITAL_COMMUNITY): Payer: Medicare Other

## 2016-10-03 ENCOUNTER — Observation Stay (HOSPITAL_COMMUNITY)
Admission: EM | Admit: 2016-10-03 | Discharge: 2016-10-04 | Disposition: A | Discharge status: Home / Self Care | Payer: Medicare Other | Attending: Internal Medicine | Admitting: Internal Medicine

## 2016-10-03 DIAGNOSIS — I10 Essential (primary) hypertension: Secondary | ICD-10-CM

## 2016-10-03 DIAGNOSIS — E1140 Type 2 diabetes mellitus with diabetic neuropathy, unspecified: Secondary | ICD-10-CM | POA: Diagnosis not present

## 2016-10-03 DIAGNOSIS — Z992 Dependence on renal dialysis: Secondary | ICD-10-CM | POA: Insufficient documentation

## 2016-10-03 DIAGNOSIS — I120 Hypertensive chronic kidney disease with stage 5 chronic kidney disease or end stage renal disease: Secondary | ICD-10-CM | POA: Diagnosis not present

## 2016-10-03 DIAGNOSIS — E8779 Other fluid overload: Secondary | ICD-10-CM | POA: Diagnosis not present

## 2016-10-03 DIAGNOSIS — E8770 Fluid overload, unspecified: Secondary | ICD-10-CM

## 2016-10-03 DIAGNOSIS — R0602 Shortness of breath: Secondary | ICD-10-CM | POA: Diagnosis not present

## 2016-10-03 DIAGNOSIS — N186 End stage renal disease: Secondary | ICD-10-CM | POA: Insufficient documentation

## 2016-10-03 DIAGNOSIS — J810 Acute pulmonary edema: Secondary | ICD-10-CM | POA: Insufficient documentation

## 2016-10-03 DIAGNOSIS — R030 Elevated blood-pressure reading, without diagnosis of hypertension: Secondary | ICD-10-CM | POA: Diagnosis not present

## 2016-10-03 DIAGNOSIS — I169 Hypertensive crisis, unspecified: Secondary | ICD-10-CM | POA: Diagnosis not present

## 2016-10-03 DIAGNOSIS — E875 Hyperkalemia: Secondary | ICD-10-CM | POA: Insufficient documentation

## 2016-10-03 DIAGNOSIS — Z794 Long term (current) use of insulin: Secondary | ICD-10-CM | POA: Insufficient documentation

## 2016-10-03 DIAGNOSIS — E1129 Type 2 diabetes mellitus with other diabetic kidney complication: Secondary | ICD-10-CM | POA: Diagnosis not present

## 2016-10-03 DIAGNOSIS — Z87891 Personal history of nicotine dependence: Secondary | ICD-10-CM | POA: Diagnosis not present

## 2016-10-03 DIAGNOSIS — E1122 Type 2 diabetes mellitus with diabetic chronic kidney disease: Secondary | ICD-10-CM | POA: Insufficient documentation

## 2016-10-03 LAB — CBC WITH DIFFERENTIAL/PLATELET
Basophils Absolute: 0 10*3/uL (ref 0.0–0.1)
Basophils Relative: 0 %
Eosinophils Absolute: 0.6 10*3/uL (ref 0.0–0.7)
Eosinophils Relative: 8 %
HEMATOCRIT: 31.6 % — AB (ref 36.0–46.0)
HEMOGLOBIN: 9.6 g/dL — AB (ref 12.0–15.0)
LYMPHS ABS: 2.2 10*3/uL (ref 0.7–4.0)
LYMPHS PCT: 29 %
MCH: 25.1 pg — AB (ref 26.0–34.0)
MCHC: 30.4 g/dL (ref 30.0–36.0)
MCV: 82.5 fL (ref 78.0–100.0)
MONO ABS: 0.5 10*3/uL (ref 0.1–1.0)
MONOS PCT: 7 %
NEUTROS ABS: 4.1 10*3/uL (ref 1.7–7.7)
Neutrophils Relative %: 56 %
Platelets: 297 10*3/uL (ref 150–400)
RBC: 3.83 MIL/uL — ABNORMAL LOW (ref 3.87–5.11)
RDW: 18.5 % — ABNORMAL HIGH (ref 11.5–15.5)
WBC: 7.4 10*3/uL (ref 4.0–10.5)

## 2016-10-03 LAB — I-STAT CHEM 8, ED
BUN: 51 mg/dL — AB (ref 6–20)
CREATININE: 12 mg/dL — AB (ref 0.44–1.00)
Calcium, Ion: 1.04 mmol/L — ABNORMAL LOW (ref 1.15–1.40)
Chloride: 114 mmol/L — ABNORMAL HIGH (ref 101–111)
GLUCOSE: 89 mg/dL (ref 65–99)
HCT: 30 % — ABNORMAL LOW (ref 36.0–46.0)
HEMOGLOBIN: 10.2 g/dL — AB (ref 12.0–15.0)
Potassium: 5 mmol/L (ref 3.5–5.1)
Sodium: 140 mmol/L (ref 135–145)
TCO2: 19 mmol/L (ref 0–100)

## 2016-10-03 LAB — COMPREHENSIVE METABOLIC PANEL
ALBUMIN: 2.7 g/dL — AB (ref 3.5–5.0)
ALK PHOS: 91 U/L (ref 38–126)
ALT: 8 U/L — ABNORMAL LOW (ref 14–54)
ANION GAP: 10 (ref 5–15)
AST: 9 U/L — ABNORMAL LOW (ref 15–41)
BUN: 57 mg/dL — ABNORMAL HIGH (ref 6–20)
CALCIUM: 8.5 mg/dL — AB (ref 8.9–10.3)
CHLORIDE: 111 mmol/L (ref 101–111)
CO2: 18 mmol/L — AB (ref 22–32)
Creatinine, Ser: 11.55 mg/dL — ABNORMAL HIGH (ref 0.44–1.00)
GFR calc Af Amer: 4 mL/min — ABNORMAL LOW (ref >60)
GFR calc non Af Amer: 4 mL/min — ABNORMAL LOW (ref >60)
GLUCOSE: 91 mg/dL (ref 65–99)
POTASSIUM: 5.2 mmol/L — AB (ref 3.5–5.1)
SODIUM: 139 mmol/L (ref 135–145)
Total Bilirubin: 0.6 mg/dL (ref 0.3–1.2)
Total Protein: 7.4 g/dL (ref 6.5–8.1)

## 2016-10-03 LAB — GLUCOSE, CAPILLARY
Glucose-Capillary: 178 mg/dL — ABNORMAL HIGH (ref 65–99)
Glucose-Capillary: 185 mg/dL — ABNORMAL HIGH (ref 65–99)

## 2016-10-03 LAB — I-STAT BETA HCG BLOOD, ED (MC, WL, AP ONLY): I-stat hCG, quantitative: <5 m[IU]/mL (ref <5)

## 2016-10-03 LAB — TROPONIN I: Troponin I: 0.04 ng/mL (ref <0.03)

## 2016-10-03 LAB — BRAIN NATRIURETIC PEPTIDE: B NATRIURETIC PEPTIDE 5: 989.1 pg/mL — AB (ref 0.0–100.0)

## 2016-10-03 MED ORDER — ONDANSETRON HCL 4 MG/2ML IJ SOLN
INTRAMUSCULAR | Status: AC
  Filled 2016-10-03: qty 2

## 2016-10-03 MED ORDER — SODIUM CHLORIDE 0.9 % IV SOLN: 100.0000 mL | INTRAVENOUS | Status: DC | PRN

## 2016-10-03 MED ORDER — CARVEDILOL 12.5 MG PO TABS
25.0000 mg | ORAL_TABLET | Freq: Once | ORAL | Status: AC
  Administered 2016-10-03: 25 mg via ORAL
  Filled 2016-10-03: qty 2

## 2016-10-03 MED ORDER — OXYCODONE-ACETAMINOPHEN 5-325 MG PO TABS
ORAL_TABLET | ORAL | Status: AC
  Filled 2016-10-03: qty 1

## 2016-10-03 MED ORDER — OXYCODONE-ACETAMINOPHEN 5-325 MG PO TABS
1.0000 | ORAL_TABLET | Freq: Four times a day (QID) | ORAL | Status: DC | PRN
  Administered 2016-10-03 – 2016-10-04 (×2): 1 via ORAL
  Filled 2016-10-03: qty 1

## 2016-10-03 MED ORDER — OXYCODONE HCL 5 MG PO TABS
5.0000 mg | ORAL_TABLET | Freq: Four times a day (QID) | ORAL | Status: DC | PRN
Start: 2016-10-03 — End: 2016-10-04
  Administered 2016-10-03: 5 mg via ORAL
  Filled 2016-10-03: qty 1

## 2016-10-03 MED ORDER — ALTEPLASE 2 MG IJ SOLR: 2.0000 mg | Freq: Once | INTRAMUSCULAR | Status: DC | PRN

## 2016-10-03 MED ORDER — LIDOCAINE-PRILOCAINE 2.5-2.5 % EX CREA: 1.0000 "application " | TOPICAL_CREAM | CUTANEOUS | Status: DC | PRN

## 2016-10-03 MED ORDER — PENTAFLUOROPROP-TETRAFLUOROETH EX AERO: 1.0000 "application " | INHALATION_SPRAY | CUTANEOUS | Status: DC | PRN

## 2016-10-03 MED ORDER — ONDANSETRON HCL 4 MG/2ML IJ SOLN
4.0000 mg | Freq: Once | INTRAMUSCULAR | Status: AC
  Administered 2016-10-03: 4 mg via INTRAVENOUS

## 2016-10-03 MED ORDER — INSULIN ASPART 100 UNIT/ML ~~LOC~~ SOLN: 0.0000 [IU] | Freq: Three times a day (TID) | SUBCUTANEOUS | Status: DC

## 2016-10-03 MED ORDER — BISACODYL 10 MG RE SUPP: 10.0000 mg | Freq: Every day | RECTAL | Status: DC | PRN

## 2016-10-03 MED ORDER — HEPARIN SODIUM (PORCINE) 1000 UNIT/ML DIALYSIS
5000.0000 [IU] | INTRAMUSCULAR | Status: DC | PRN
  Filled 2016-10-03: qty 5

## 2016-10-03 MED ORDER — LIDOCAINE HCL (PF) 1 % IJ SOLN: 5.0000 mL | INTRAMUSCULAR | Status: DC | PRN

## 2016-10-03 MED ORDER — OXYCODONE HCL 5 MG PO TABS
ORAL_TABLET | ORAL | Status: AC
  Filled 2016-10-03: qty 1

## 2016-10-03 MED ORDER — HYDROCHLOROTHIAZIDE 25 MG PO TABS
25.0000 mg | ORAL_TABLET | Freq: Once | ORAL | Status: AC
  Administered 2016-10-03: 25 mg via ORAL
  Filled 2016-10-03: qty 1

## 2016-10-03 MED ORDER — HEPARIN SODIUM (PORCINE) 1000 UNIT/ML DIALYSIS: 1000.0000 [IU] | INTRAMUSCULAR | Status: DC | PRN

## 2016-10-03 MED ORDER — INSULIN GLARGINE 100 UNIT/ML ~~LOC~~ SOLN
18.0000 [IU] | Freq: Every day | SUBCUTANEOUS | Status: DC
  Administered 2016-10-04: 18 [IU] via SUBCUTANEOUS
  Filled 2016-10-03 (×2): qty 0.18

## 2016-10-03 MED ORDER — AMLODIPINE BESYLATE 5 MG PO TABS
5.0000 mg | ORAL_TABLET | Freq: Every day | ORAL | Status: DC
  Administered 2016-10-03: 5 mg via ORAL
  Filled 2016-10-03: qty 1

## 2016-10-03 MED ORDER — OXYCODONE-ACETAMINOPHEN 10-325 MG PO TABS: 1.0000 | ORAL_TABLET | Freq: Four times a day (QID) | ORAL | Status: DC | PRN

## 2016-10-03 NOTE — Consult Note (Addendum)
Renal Service Consult Note [facility]  Karen [name] 10/03/2016 [place] Requesting Physician:  Dr Vista Lawman  Reason for Consult:  ESRD pt missed HD x 1 week HPI: The patient is a 41 y.o. year-old with history of IDDM, L BKA, HTN, anxiety presented to ED with history of mild SOB and missed HD last 3 sessions.  She had missed HD she says for several reasons: kids were home for snow days / her house doesn't have proper ramp and she fell once / her uncle was shot in the head and killed last week in [place], [place].  She is planning to move this week closer to her parents and other family .  Transportation is [employer].  She lives with her 40 yr old twins and her 40 yr old, she is a single mother, has Medicare/ and Medicaid.  Uses a R chest TDC, doesn't have a fistula/ graft yet, started HD in April '17.    Deneis any f/c/s, n/v/d, abd pain, no CP or prod cough.    ROS  denies CP  no joint pain   no HA  no blurry vision  no rash  no diarrhea  no nausea/ vomiting  no dysuria  no difficulty voiding  no change in urine color    Past Medical History  Past Medical History:  Diagnosis Date  . Anemia   . Anxiety   . Exertional shortness of breath    "recently; it's fluid" (02/02/2013)  . GERD (gastroesophageal reflux disease)   . History of blood transfusion    "last week" (02/02/2013)  . Hypertension   . Ischemic cardiomyopathy    by echo 2014  . Osteomyelitis of toe of left foot ([place])    "off and on since 2009; no OR" (02/02/2013)  . Renal failure    acute vs chronic/notes 02/02/2013  . Type II diabetes mellitus ([place]) 1995   Past Surgical History  Past Surgical History:  Procedure Laterality Date  . AMPUTATION Left 02/06/2013   Procedure: AMPUTATION LEFT GREAT TOE;  Surgeon: Wylene Simmer, MD;  Location: [facility];  Service: Orthopedics;  Laterality: Left;  . AMPUTATION Left 06/24/2013   Procedure: AMPUTATION BELOW KNEE ;  Surgeon: Wylene Simmer, MD;  Location:  [place];  Service: Orthopedics;  Laterality: Left;  . CESAREAN SECTION  10/18/2006  . INSERTION OF DIALYSIS CATHETER     Family History  Family History  Problem Relation Age of Onset  . Hypertension Mother   . Diabetes Mother   . Diabetes Father    Social History  reports that she quit smoking about 12 years ago. Her smoking use included Cigarettes. She has a 30.00 pack-year smoking history. She has never used smokeless tobacco. She reports that she does not drink alcohol or use drugs. Allergies  Allergies  Allergen Reactions  . Morphine And Related Hives and Rash  . Novolog [Insulin Aspart] Other (See Comments)    Cramps/ Gi distress  . Penicillins Hives and Rash    Has patient had a PCN reaction causing immediate rash, facial/tongue/throat swelling, SOB or lightheadedness with hypotension: No Has patient had a PCN reaction causing severe rash involving mucus membranes or skin necrosis: No Has patient had a PCN reaction that required hospitalization No Has patient had a PCN reaction occurring within the last 10 years: No If all of the above answers are "NO", then may proceed with Cephalosporin use. 09/10/16- tolerated Cefepime    Home medications Prior to Admission medications  Medication Sig Start Date End Date Taking? Authorizing Provider  amLODipine (NORVASC) 5 MG tablet Take 1 tablet (5 mg total) by mouth daily. 05/29/16  Yes Oswald Hillock, MD  carvedilol (COREG) 25 MG tablet Take 25 mg by mouth 2 (two) times daily with a meal.  01/14/16  Yes Historical Provider, MD  insulin glargine (LANTUS) 100 UNIT/ML injection Inject 18 Units into the skin at bedtime.   Yes Historical Provider, MD  oxyCODONE-acetaminophen (PERCOCET) 10-325 MG tablet Take 1 tablet by mouth 3 (three) times daily as needed for pain.  09/25/16  Yes Historical Provider, MD   Liver Function Tests  Recent Labs Lab 10/03/16 1345  AST 9*  ALT 8*  ALKPHOS 91  BILITOT 0.6  PROT 7.4  ALBUMIN 2.7*   No results for  input(s): LIPASE, AMYLASE in the last 168 hours. CBC  Recent Labs Lab 10/03/16 1345 10/03/16 1355  WBC 7.4  --   NEUTROABS 4.1  --   HGB 9.6* 10.2*  HCT 31.6* 30.0*  MCV 82.5  --   PLT 297  --    Basic Metabolic Panel  Recent Labs Lab 10/03/16 1345 10/03/16 1355  NA 139 140  K 5.2* 5.0  CL 111 114*  CO2 18*  --   GLUCOSE 91 89  BUN 57* 51*  CREATININE 11.55* 12.00*  CALCIUM 8.5*  --    Iron/TIBC/Ferritin/ %Sat    Component Value Date/Time   IRON 31 (L) 02/05/2013 0327   TIBC 155 (L) 02/05/2013 0327   FERRITIN 127 02/05/2013 0327   IRONPCTSAT 20 02/05/2013 0327    Vitals:   10/03/16 1700 10/03/16 1715 10/03/16 1745 10/03/16 1843  BP: 164/96 (!) 143/116 147/96 (!) 173/89  Pulse: 82 80 90 94  Resp:    20  Temp:    98.5 F (36.9 C)  TempSrc:    Oral  SpO2: 99% 97% 98% 100%  Height:    5' 0.5" (1.537 m)   Exam Gen alert, pleasant, no distress, AAF Dark papular rash on extensor surfaces Sclera anicteric, throat clear  No jvd or bruits Chest clear bilat RRR no MRG Abd soft ntnd no mass or ascites +bs obese GU defer MS no joint effusions or deformity, L BKA well healed Ext trace-1+ LE edema / no wounds or ulcers Neuro is alert, Ox 3 , nf    Dialysis: [facility] 4h   99kg   2/2.25 bath  Hep 6000  R IJ cath  - Venofer 50/ wk  - Calcitriol 0.25 ug tiw  - last Hb 11 > 8.9,  P 6, pth 652  Assessment: 1. SOB/ vol overload - interstitial pulm edema on CXR today 2. ESRD missed HD > 1 week 3. Hyperkalemia - mild/ mod 4. HTN crisis - better after po meds 5. IDDM   Plan - acute HD tonight, 4 hrs , max UF. Should be ok for dc in am.    Kelly Splinter MD [place] pager [phone]   10/03/2016, 6:52 PM

## 2016-10-03 NOTE — H&P (Signed)
History and Physical    Karen [name] VHQ:[id number] DOB: [birth year] DOA: 10/03/2016  Referring MD/NP/PA: Dr Oleta Mouse PCP: Pcp Not In System None Outpatient Specialists:  Patient coming from: Home  Chief Complaint: missed Dialysis.  HPI: Karen [name] is a 41 y.o. female who presents with need for dialysis. She has a history of type 2 diabetes on insulin, ischemic cardiomyopathy, and end-stage renal disease on hemodialysis Tuesdays, Thursdays, and Saturdays. States that she has been having a lot of stressful events in her family, and has been not taking her medications as well as missing dialysis. Her last dialysis was 11 days ago on 09/22/2016.   She notes mild dyspnea on exertion without any fever or chills or last few days.  She denies any chest pain, cough or sputum production, no nausea or vomiting..She admits to peripheral edema in her legs noted today.  She attempted to get dialysis done for her  today at [facility], but it was recommended to her that she come to the ED to be evaluated for emergent dialysis given that she has not had dialysis over 11 days.   ED Course: Patient's blood pressure was noted to be very high and was given doses of antihypertensive medication with improvement in her blood pressure.  Nephrology consultant was called who recommended observation with plans for dialysis sometime today with possible discharge tomorrow.  Review of Systems: As per HPI otherwise 10 point review of systems negative.   Past Medical History:  Diagnosis Date  . Anemia   . Anxiety   . Exertional shortness of breath    "recently; it's fluid" (02/02/2013)  . GERD (gastroesophageal reflux disease)   . History of blood transfusion    "last week" (02/02/2013)  . Hypertension   . Ischemic cardiomyopathy    by echo 2014  . Osteomyelitis of toe of left foot ([place])    "off and on since 2009; no OR" (02/02/2013)  . Renal failure    acute vs chronic/notes 02/02/2013  . Type II diabetes mellitus  ([place]) 1995    Past Surgical History:  Procedure Laterality Date  . AMPUTATION Left 02/06/2013   Procedure: AMPUTATION LEFT GREAT TOE;  Surgeon: Wylene Simmer, MD;  Location: [place];  Service: Orthopedics;  Laterality: Left;  . AMPUTATION Left 06/24/2013   Procedure: AMPUTATION BELOW KNEE ;  Surgeon: Wylene Simmer, MD;  Location: [place];  Service: Orthopedics;  Laterality: Left;  . CESAREAN SECTION  10/18/2006  . INSERTION OF DIALYSIS CATHETER       reports that she quit smoking about 12 years ago. Her smoking use included Cigarettes. She has a 30.00 pack-year smoking history. She has never used smokeless tobacco. She reports that she does not drink alcohol or use drugs.  Allergies  Allergen Reactions  . Morphine And Related Hives and Rash  . Novolog [Insulin Aspart] Other (See Comments)    Cramps/ Gi distress  . Penicillins Hives and Rash    Has patient had a PCN reaction causing immediate rash, facial/tongue/throat swelling, SOB or lightheadedness with hypotension: No Has patient had a PCN reaction causing severe rash involving mucus membranes or skin necrosis: No Has patient had a PCN reaction that required hospitalization No Has patient had a PCN reaction occurring within the last 10 years: No If all of the above answers are "NO", then may proceed with Cephalosporin use. 09/10/16- tolerated Cefepime     Family History  Problem Relation Age of Onset  . Hypertension Mother   . Diabetes  Mother   . Diabetes Father       Prior to Admission medications   Medication Sig Start Date End Date Taking? Authorizing Provider  amLODipine (NORVASC) 5 MG tablet Take 1 tablet (5 mg total) by mouth daily. 05/29/16  Yes Oswald Hillock, MD  carvedilol (COREG) 25 MG tablet Take 25 mg by mouth 2 (two) times daily with a meal.  01/14/16  Yes Historical Provider, MD  insulin glargine (LANTUS) 100 UNIT/ML injection Inject 18 Units into the skin at bedtime.   Yes Historical Provider, MD  insulin lispro  (HUMALOG) 100 UNIT/ML injection Inject 2-5 Units into the skin daily as needed for high blood sugar. Per sliding scale   Yes Historical Provider, MD  oxyCODONE-acetaminophen (PERCOCET) 10-325 MG tablet Take 1 tablet by mouth 3 (three) times daily as needed for pain.  09/25/16  Yes Historical Provider, MD    Physical Exam: Vitals:   10/03/16 1515 10/03/16 1530 10/03/16 1545 10/03/16 1600  BP: (!) 174/101 (!) 192/117 (!) 171/112 (!) 175/106  Pulse: 96 91 (!) 40 84  Resp:      Temp:      SpO2: 93% 96% 100% 95%  Height:          Constitutional: NAD, calm, comfortable Vitals:   10/03/16 1515 10/03/16 1530 10/03/16 1545 10/03/16 1600  BP: (!) 174/101 (!) 192/117 (!) 171/112 (!) 175/106  Pulse: 96 91 (!) 40 84  Resp:      Temp:      SpO2: 93% 96% 100% 95%  Height:       Eyes: PERRL, lids and conjunctivae normal ENMT: Mucous membranes are moist. Posterior pharynx clear of any exudate or lesions.Normal dentition.  Neck: normal, supple, no masses, no thyromegaly Respiratory: clear to auscultation bilaterally, no wheezing, no crackles. Normal respiratory effort. No accessory muscle use.  Cardiovascular: Regular rate and rhythm, no murmurs / rubs / gallops. No extremity edema. 2+ pedal pulses. No carotid bruits.  Abdomen: no tenderness, no masses palpated. No hepatosplenomegaly. Bowel sounds positive.  Musculoskeletal: no clubbing / cyanosis. No joint deformity upper and lower extremities. Good ROM, no contractures. Normal muscle tone.  Skin: no rashes, lesions, ulcers. No induration Neurologic: CN 2-12 grossly intact. Sensation intact, DTR normal. Strength 5/5 in all 4.  Psychiatric: Normal judgment and insight. Alert and oriented x 3. Normal mood.     Labs on Admission: I have personally reviewed following labs and imaging studies  CBC:  Recent Labs Lab 10/03/16 1345 10/03/16 1355  WBC 7.4  --   NEUTROABS 4.1  --   HGB 9.6* 10.2*  HCT 31.6* 30.0*  MCV 82.5  --   PLT 297  --      Basic Metabolic Panel:  Recent Labs Lab 10/03/16 1345 10/03/16 1355  NA 139 140  K 5.2* 5.0  CL 111 114*  CO2 18*  --   GLUCOSE 91 89  BUN 57* 51*  CREATININE 11.55* 12.00*  CALCIUM 8.5*  --    GFR: CrCl cannot be calculated (Unknown ideal weight.). Liver Function Tests:  Recent Labs Lab 10/03/16 1345  AST 9*  ALT 8*  ALKPHOS 91  BILITOT 0.6  PROT 7.4  ALBUMIN 2.7*   No results for input(s): LIPASE, AMYLASE in the last 168 hours. No results for input(s): AMMONIA in the last 168 hours. Coagulation Profile: No results for input(s): INR, PROTIME in the last 168 hours. Cardiac Enzymes: No results for input(s): CKTOTAL, CKMB, CKMBINDEX, TROPONINI in the last 168 hours.  BNP (last 3 results) No results for input(s): PROBNP in the last 8760 hours. HbA1C: No results for input(s): HGBA1C in the last 72 hours. CBG: No results for input(s): GLUCAP in the last 168 hours. Lipid Profile: No results for input(s): CHOL, HDL, LDLCALC, TRIG, CHOLHDL, LDLDIRECT in the last 72 hours. Thyroid Function Tests: No results for input(s): TSH, T4TOTAL, FREET4, T3FREE, THYROIDAB in the last 72 hours. Anemia Panel: No results for input(s): VITAMINB12, FOLATE, FERRITIN, TIBC, IRON, RETICCTPCT in the last 72 hours. Urine analysis:    Component Value Date/Time   COLORURINE YELLOW 09/09/2016 [place] 09/09/2016 0554   LABSPEC 1.015 09/09/2016 0554   PHURINE 7.0 09/09/2016 0554   GLUCOSEU >=500 (A) 09/09/2016 0554   HGBUR NEGATIVE 09/09/2016 0554   BILIRUBINUR NEGATIVE 09/09/2016 0554   KETONESUR NEGATIVE 09/09/2016 0554   PROTEINUR >=300 (A) 09/09/2016 0554   UROBILINOGEN 0.2 02/03/2013 0038   NITRITE NEGATIVE 09/09/2016 0554   LEUKOCYTESUR NEGATIVE 09/09/2016 0554   Sepsis Labs: @LABRCNTIP (procalcitonin:4,lacticidven:4) )No results found for this or any previous visit (from the past 240 hour(s)).   Radiological Exams on Admission: Dg Chest 2 View  Result  Date: 10/03/2016 CLINICAL DATA:  Pt states last dialysis was Tuesday the 2nd of January. Dialysis center wanted her to come in to get back on track. Pt also hypertensive because she is out of her blood pressure medications and has not had them in three days. Hx of hypertension, diabetes, and ischemic cardiomyopathy. EXAM: CHEST  2 VIEW COMPARISON:  09/08/2016 FINDINGS: Cardiac silhouette is mildly enlarged.  No mediastinal hilar masses. There is interstitial thickening bilaterally. More focal increased opacities noted in the right lower lobe at the lung base. This is similar to the prior study. No pneumothorax. Right anterior chest wall dual lumen central venous catheter is stable. IMPRESSION: 1. Interstitial thickening with right lung base opacity, the latter likely atelectasis. Suspect mild interstitial pulmonary edema. Right lower lobe pneumonia is possible. Findings are similar to the prior study. Electronically Signed   By: Lajean Manes M.D.   On: 10/03/2016 14:09    EKG:   Assessment/Plan Active Problems:   * No active hospital problems. *   1.  End-stage renal disease on hemodialysis:  Patient missed her dialysis with mild hyperkalemia and metabolic acidosis -for dialysis today with monitoring of her renal indices and electrolyte status.  2.   Mild shortness of breath with fluid overload:  Do to diagnosis #1 - should improve with dialysis.  Doubt cardiac ischemia.  Cardiac enzyme and BNP ordered- risk of false positive troponins with diagnosis #1 - follow clinically.  3.  Normocytic anemia:  Likely anemia of chronic disease -outpatient follow-up   4.Hypertension:  Uncontrolled due to noncompliance- resume home antihypertensive regimen and follow clinically.   DVT prophylaxis:  (SCD') Code Status: (Full) Family Communication: N/A Disposition Plan: Home.  Consults called: Dr Christella Hartigan (Nephrology) Admission status:  ( obs / tele)   [name],Karen Decarli MD [facility] Pager [phone]  If 7PM-7AM, please contact night-coverage [email or web address] Password [facility]  10/03/2016, 4:51 PM

## 2016-10-03 NOTE — ED Notes (Signed)
Attempted report to 6E 

## 2016-10-03 NOTE — ED Triage Notes (Signed)
Pt states last dialysis was Tuesday the 2nd of January. Dialysis center wanted her to come in to get back on track. Pt also hypertensive because she is out of her blood pressure medications and has not had them in three days.

## 2016-10-03 NOTE — ED Provider Notes (Signed)
[facility] DEPT Provider Note   CSN: [id number] Arrival date & time: 10/03/16  1253     History   Chief Complaint Chief Complaint  Patient presents with  . Other    needs dialysis    HPI Karen [name] is a 41 y.o. female.  HPI 41 year old female who presents with need for dialysis. She has a history of type 2 diabetes on insulin, ischemic cardiomyopathy, end-stage renal disease on hemodialysis Tuesday, Thursday, and Saturday. States that she has been having a lot of stressful events in her family, and has been not taking her medications as well as missing dialysis. Her last dialysis was 11 days ago on 09/22/2016. She has noted mild fluid retention around her abdomen. Over the past day she has had dyspnea on exertion, and orthopnea. No chest pain, nausea or vomiting, syncope or near syncope. No fever or chills or recent illness.  She attempted to get dialyzed today at [facility], but she it was recommended to her that she come to the ED to be evaluated for emergent dialysis given that she has not had dialysis over 11 days.   Past Medical History:  Diagnosis Date  . Anemia   . Anxiety   . Exertional shortness of breath    "recently; it's fluid" (02/02/2013)  . GERD (gastroesophageal reflux disease)   . History of blood transfusion    "last week" (02/02/2013)  . Hypertension   . Ischemic cardiomyopathy    by echo 2014  . Osteomyelitis of toe of left foot ([place])    "off and on since 2009; no OR" (02/02/2013)  . Renal failure    acute vs chronic/notes 02/02/2013  . Type II diabetes mellitus ([place]) 1995    Patient Active Problem List   Diagnosis Date Noted  . Anemia in other chronic diseases classified elsewhere 09/08/2016  . High anion gap metabolic acidosis 98/33/8250  . Right lower lobe pneumonia ([place]) 09/08/2016  . Uncontrolled type 2 diabetes mellitus with chronic kidney disease on chronic dialysis, with long-term current use of insulin ([place]) 01/31/2016  . Noncompliance  with medications   . End-stage renal disease on hemodialysis ([place])   . Hx of BKA ([place]) 07/03/2013  . S/P BKA (below knee amputation) unilateral ([place]) 06/30/2013  . Physical deconditioning 06/30/2013  . Cardiomyopathy, ischemic - EF 45-50% with inf WMA by 2D 02/05/13 02/06/2013  . Essential hypertension 02/05/2013  . PAD (peripheral artery disease) ([place]) 02/05/2013  . Diabetic neuropathy ([place])   . Osteomyelitis of left great toe - S/P amputation 02/06/13 04/21/2011    Past Surgical History:  Procedure Laterality Date  . AMPUTATION Left 02/06/2013   Procedure: AMPUTATION LEFT GREAT TOE;  Surgeon: Wylene Simmer, MD;  Location: [place];  Service: Orthopedics;  Laterality: Left;  . AMPUTATION Left 06/24/2013   Procedure: AMPUTATION BELOW KNEE ;  Surgeon: Wylene Simmer, MD;  Location: [place];  Service: Orthopedics;  Laterality: Left;  . CESAREAN SECTION  10/18/2006  . INSERTION OF DIALYSIS CATHETER      OB History    No data available       Home Medications    Prior to Admission medications   Medication Sig Start Date End Date Taking? Authorizing Provider  amLODipine (NORVASC) 5 MG tablet Take 1 tablet (5 mg total) by mouth daily. 05/29/16  Yes Oswald Hillock, MD  carvedilol (COREG) 25 MG tablet Take 25 mg by mouth 2 (two) times daily with a meal.  01/14/16  Yes Historical Provider, MD  insulin  glargine (LANTUS) 100 UNIT/ML injection Inject 18 Units into the skin at bedtime.   Yes Historical Provider, MD  insulin lispro (HUMALOG) 100 UNIT/ML injection Inject 2-5 Units into the skin daily as needed for high blood sugar. Per sliding scale   Yes Historical Provider, MD  oxyCODONE-acetaminophen (PERCOCET) 10-325 MG tablet Take 1 tablet by mouth 3 (three) times daily as needed for pain.  09/25/16  Yes Historical Provider, MD    Family History Family History  Problem Relation Age of Onset  . Hypertension Mother   . Diabetes Mother   . Diabetes Father     Social History Social History  Substance Use  Topics  . Smoking status: Former Smoker    Packs/day: 3.00    Years: 10.00    Types: Cigarettes    Quit date: 09/21/2004  . Smokeless tobacco: Never Used  . Alcohol use No     Allergies   Morphine and related; Novolog [insulin aspart]; and Penicillins   Review of Systems Review of Systems 10/14 systems reviewed and are negative other than those stated in the HPI   Physical Exam Updated Vital Signs BP (!) 204/123   Pulse 107   Temp 97.8 F (36.6 C)   Resp 18   Ht 5' 0.5" (1.537 m)   LMP 09/21/2016   SpO2 97%   Physical Exam Physical Exam  Nursing note and vitals reviewed. Constitutional:  non-toxic, and in no acute distress Head: Normocephalic and atraumatic.  Mouth/Throat: Oropharynx is clear and moist.  Neck: Normal range of motion. Neck supple.  Cardiovascular: Tachycardic rate and regular rhythm.   Pulmonary/Chest: Effort normal and breath sounds normal.  Abdominal: Soft. There is no tenderness. Mild distension. There is no rebound and no guarding.  Musculoskeletal: Normal range of motion.  Neurological: Alert, no facial droop, fluent speech, moves all extremities symmetrically Skin: Skin is warm and dry.  Psychiatric: Cooperative   ED Treatments / Results  Labs (all labs ordered are listed, but only abnormal results are displayed) Labs Reviewed  COMPREHENSIVE METABOLIC PANEL - Abnormal; Notable for the following:       Result Value   Potassium 5.2 (*)    CO2 18 (*)    BUN 57 (*)    Creatinine, Ser 11.55 (*)    Calcium 8.5 (*)    Albumin 2.7 (*)    AST 9 (*)    ALT 8 (*)    GFR calc non Af Amer 4 (*)    GFR calc Af Amer 4 (*)    All other components within normal limits  CBC WITH DIFFERENTIAL/PLATELET - Abnormal; Notable for the following:    RBC 3.83 (*)    Hemoglobin 9.6 (*)    HCT 31.6 (*)    MCH 25.1 (*)    RDW 18.5 (*)    All other components within normal limits  I-STAT CHEM 8, ED - Abnormal; Notable for the following:    Chloride 114 (*)     BUN 51 (*)    Creatinine, Ser 12.00 (*)    Calcium, Ion 1.04 (*)    Hemoglobin 10.2 (*)    HCT 30.0 (*)    All other components within normal limits  I-STAT BETA HCG BLOOD, ED ([facility], [facility], [facility] ONLY)    EKG  EKG Interpretation  Date/Time:  Saturday October 03 2016 13:41:23 EST Ventricular Rate:  103 PR Interval:    QRS Duration: 97 QT Interval:  365 QTC Calculation: 478 R Axis:   96 Text  Interpretation:  Sinus tachycardia Borderline right axis deviation Baseline wander in lead(s) V3 other than tachycardia, no other acute findings  Confirmed by Adeel Guiffre MD, Richey Doolittle [phone]) on 10/03/2016 2:22:24 PM       Radiology Dg Chest 2 View  Result Date: 10/03/2016 CLINICAL DATA:  Pt states last dialysis was Tuesday the 2nd of January. Dialysis center wanted her to come in to get back on track. Pt also hypertensive because she is out of her blood pressure medications and has not had them in three days. Hx of hypertension, diabetes, and ischemic cardiomyopathy. EXAM: CHEST  2 VIEW COMPARISON:  09/08/2016 FINDINGS: Cardiac silhouette is mildly enlarged.  No mediastinal hilar masses. There is interstitial thickening bilaterally. More focal increased opacities noted in the right lower lobe at the lung base. This is similar to the prior study. No pneumothorax. Right anterior chest wall dual lumen central venous catheter is stable. IMPRESSION: 1. Interstitial thickening with right lung base opacity, the latter likely atelectasis. Suspect mild interstitial pulmonary edema. Right lower lobe pneumonia is possible. Findings are similar to the prior study. Electronically Signed   By: Lajean Manes M.D.   On: 10/03/2016 14:09    Procedures Procedures (including critical care time)  Medications Ordered in ED Medications  amLODipine (NORVASC) tablet 5 mg (5 mg Oral Given 10/03/16 1433)  carvedilol (COREG) tablet 25 mg (25 mg Oral Given 10/03/16 1433)  hydrochlorothiazide (HYDRODIURIL) tablet 25 mg (25 mg Oral Given  10/03/16 1433)     Initial Impression / Assessment and Plan / ED Course  I have reviewed the triage vital signs and the nursing notes.  Pertinent labs & imaging results that were available during my care of the patient were reviewed by me and considered in my medical decision making (see chart for details).  Clinical Course    No dialysis since 1/2 w/ fluid overload symptoms. Non-toxic and in no acute distress. On room air. Hypertensive SBP 200/110s. Mild pulmonary interstitial edema on CXR visualization. Potassium 5.2. No appreciable EKG changes.  Discussed with Dr. Melvia Heaps, who will arrange for dialysis but recommending admission overnight.  Discussed with hospitalist service, who will admit for observation.  Final Clinical Impressions(s) / ED Diagnoses   Final diagnoses:  Hyperkalemia  Acute pulmonary edema ([place])    New Prescriptions New Prescriptions   No medications on file     Forde Dandy, MD 10/03/16 1606

## 2016-10-03 NOTE — ED Notes (Signed)
Placed patient on the monitor did vitals patient is waiting for provider

## 2016-10-03 NOTE — Progress Notes (Signed)
Patient arrived from E.D.Placed in telemetry bed,called and verified,bed alarm set on.Alert and oriented x 4.Has a left BKA with prosthesis,ambulated with steady gait ,at times with the used of rolling walker.No skin issues except for those multiple darkened ,dry pustules on her hands,skin assessed with Rodena Piety.

## 2016-10-04 ENCOUNTER — Observation Stay (HOSPITAL_COMMUNITY): Payer: Medicare Other

## 2016-10-04 DIAGNOSIS — R0602 Shortness of breath: Secondary | ICD-10-CM | POA: Diagnosis not present

## 2016-10-04 DIAGNOSIS — I120 Hypertensive chronic kidney disease with stage 5 chronic kidney disease or end stage renal disease: Secondary | ICD-10-CM | POA: Diagnosis not present

## 2016-10-04 DIAGNOSIS — Z794 Long term (current) use of insulin: Secondary | ICD-10-CM | POA: Diagnosis not present

## 2016-10-04 DIAGNOSIS — E1129 Type 2 diabetes mellitus with other diabetic kidney complication: Secondary | ICD-10-CM | POA: Diagnosis not present

## 2016-10-04 DIAGNOSIS — E1122 Type 2 diabetes mellitus with diabetic chronic kidney disease: Secondary | ICD-10-CM | POA: Diagnosis not present

## 2016-10-04 DIAGNOSIS — Z992 Dependence on renal dialysis: Secondary | ICD-10-CM | POA: Diagnosis not present

## 2016-10-04 DIAGNOSIS — E8779 Other fluid overload: Secondary | ICD-10-CM | POA: Diagnosis not present

## 2016-10-04 DIAGNOSIS — N186 End stage renal disease: Secondary | ICD-10-CM | POA: Diagnosis not present

## 2016-10-04 DIAGNOSIS — E8770 Fluid overload, unspecified: Secondary | ICD-10-CM | POA: Diagnosis not present

## 2016-10-04 DIAGNOSIS — I169 Hypertensive crisis, unspecified: Secondary | ICD-10-CM | POA: Diagnosis not present

## 2016-10-04 DIAGNOSIS — J810 Acute pulmonary edema: Secondary | ICD-10-CM | POA: Diagnosis not present

## 2016-10-04 DIAGNOSIS — E1140 Type 2 diabetes mellitus with diabetic neuropathy, unspecified: Secondary | ICD-10-CM | POA: Diagnosis not present

## 2016-10-04 DIAGNOSIS — E875 Hyperkalemia: Secondary | ICD-10-CM | POA: Diagnosis not present

## 2016-10-04 LAB — CBC
HCT: 28.1 % — ABNORMAL LOW (ref 36.0–46.0)
Hemoglobin: 8.7 g/dL — ABNORMAL LOW (ref 12.0–15.0)
MCH: 25.5 pg — ABNORMAL LOW (ref 26.0–34.0)
MCHC: 31 g/dL (ref 30.0–36.0)
MCV: 82.4 fL (ref 78.0–100.0)
PLATELETS: 200 10*3/uL (ref 150–400)
RBC: 3.41 MIL/uL — ABNORMAL LOW (ref 3.87–5.11)
RDW: 18.4 % — AB (ref 11.5–15.5)
WBC: 7.7 10*3/uL (ref 4.0–10.5)

## 2016-10-04 LAB — COMPREHENSIVE METABOLIC PANEL
ALT: 8 U/L — AB (ref 14–54)
AST: 9 U/L — AB (ref 15–41)
Albumin: 2.5 g/dL — ABNORMAL LOW (ref 3.5–5.0)
Alkaline Phosphatase: 79 U/L (ref 38–126)
Anion gap: 9 (ref 5–15)
BILIRUBIN TOTAL: 0.6 mg/dL (ref 0.3–1.2)
BUN: 27 mg/dL — AB (ref 6–20)
CO2: 25 mmol/L (ref 22–32)
CREATININE: 6.47 mg/dL — AB (ref 0.44–1.00)
Calcium: 7.7 mg/dL — ABNORMAL LOW (ref 8.9–10.3)
Chloride: 101 mmol/L (ref 101–111)
GFR calc Af Amer: 8 mL/min — ABNORMAL LOW (ref >60)
GFR, EST NON AFRICAN AMERICAN: 7 mL/min — AB (ref >60)
Glucose, Bld: 113 mg/dL — ABNORMAL HIGH (ref 65–99)
Potassium: 3.9 mmol/L (ref 3.5–5.1)
Sodium: 135 mmol/L (ref 135–145)
TOTAL PROTEIN: 6.9 g/dL (ref 6.5–8.1)

## 2016-10-04 LAB — GLUCOSE, CAPILLARY
GLUCOSE-CAPILLARY: 104 mg/dL — AB (ref 65–99)
Glucose-Capillary: 102 mg/dL — ABNORMAL HIGH (ref 65–99)

## 2016-10-04 LAB — HEMOGLOBIN A1C
HEMOGLOBIN A1C: 8.1 % — AB (ref 4.8–5.6)
Mean Plasma Glucose: 186 mg/dL

## 2016-10-04 LAB — TROPONIN I: Troponin I: 0.03 ng/mL (ref <0.03)

## 2016-10-04 LAB — BRAIN NATRIURETIC PEPTIDE: B NATRIURETIC PEPTIDE 5: 846.1 pg/mL — AB (ref 0.0–100.0)

## 2016-10-04 MED ORDER — PANTOPRAZOLE SODIUM 40 MG PO TBEC
40.0000 mg | DELAYED_RELEASE_TABLET | Freq: Every day | ORAL | Status: DC
  Administered 2016-10-04: 40 mg via ORAL
  Filled 2016-10-04: qty 1

## 2016-10-04 MED ORDER — METOCLOPRAMIDE HCL 10 MG PO TABS
10.0000 mg | ORAL_TABLET | Freq: Once | ORAL | Status: AC
Start: 2016-10-04 — End: 2016-10-04
  Administered 2016-10-04: 10 mg via ORAL
  Filled 2016-10-04: qty 1

## 2016-10-04 MED ORDER — METOCLOPRAMIDE HCL 5 MG/ML IJ SOLN
10.0000 mg | Freq: Once | INTRAMUSCULAR | Status: AC
  Administered 2016-10-04: 10 mg via INTRAVENOUS
  Filled 2016-10-04: qty 2

## 2016-10-04 MED ORDER — AMLODIPINE BESYLATE 10 MG PO TABS
10.0000 mg | ORAL_TABLET | Freq: Every day | ORAL | Status: DC
  Administered 2016-10-04: 10 mg via ORAL
  Filled 2016-10-04: qty 1

## 2016-10-04 NOTE — Progress Notes (Signed)
Patient signed AMA.M.D made aware.

## 2016-10-04 NOTE — Progress Notes (Signed)
Patient said that she could not wait for that CT-scan coz there is dead in the family.She is willing to sign A.M.A.M.D. made awre.

## 2016-10-04 NOTE — Progress Notes (Signed)
Patient said that she is going home Theme park manager explained to her that her M.D.ordered a CT-angiogram of her chest to evaluate her chest pain and discomfort.Patient asked if it needs I.V. contrast ,patient has been refusing any piv since admission for the reason that she is hard to stick.The writer told her that I already spoke to the her doctor and the order said without I.v contrast.Patient said she would stay.

## 2016-10-04 NOTE — Progress Notes (Signed)
  Karen [name] Progress Note   Subjective: did not tolerate any UF with HD last night, pt had nausea early in the Rx assoc with BP changes and UF. Did get HD though, just 1.5 L fluid off.   Vitals:   10/04/16 0100 10/04/16 0110 10/04/16 0652 10/04/16 0858  BP: (!) 170/90 (!) 167/96 (!) 165/93 (!) 141/72  Pulse: 92 87 87 97  Resp: 18 20 18 16   Temp:  97.9 F (36.6 C) 98.9 F (37.2 C) 98.8 F (37.1 C)  TempSrc:  Oral Oral Oral  SpO2:  98% 93% 95%  Weight:  100 kg (220 lb 7.4 oz)    Height:        Inpatient medications: . amLODipine  10 mg Oral Daily  . insulin aspart  0-20 Units Subcutaneous TID WC  . insulin glargine  18 Units Subcutaneous QHS    bisacodyl, oxyCODONE-acetaminophen **AND** oxyCODONE  Exam: Gen alert, pleasant, no distress, AAF No jvd or bruits Chest some slight exp wheezing at the bases RRR no MRG Abd soft ntnd no mass or ascites +bs obese GU defer MS no joint effusions or deformity, L BKA well healed Ext trace-1+ LE edema / no wounds or ulcers Neuro is alert, Ox 3 , nf    Dialysis: [facility] 4h   99kg   2/2.25 bath  Hep 6000  R IJ cath  - Venofer 50/ wk  - Calcitriol 0.25 ug tiw  - last Hb 11 > 8.9,  P 6, pth 652  Assessment: 1. SOB - didn't tolerate attempt to pull fluid last night.  Not sure if she is wet or infectious.   Have d/w primary MD, will get noncontrast CT chest.   2. ESRD missed HD > 1 week 3. Hyperkalemia - mild/ mod 4. HTN crisis - better after po meds 5. IDDM  Plan - for CT chest, r/o PNA vs edema vs other  Kelly Splinter MD [name] pager [phone]   10/04/2016, 11:34 AM    Recent Labs Lab 10/03/16 1345 10/03/16 1355 10/04/16 0619  NA 139 140 135  K 5.2* 5.0 3.9  CL 111 114* 101  CO2 18*  --  25  GLUCOSE 91 89 113*  BUN 57* 51* 27*  CREATININE 11.55* 12.00* 6.47*  CALCIUM 8.5*  --  7.7*    Recent Labs Lab 10/03/16 1345 10/04/16 0619  AST 9* 9*  ALT 8* 8*  ALKPHOS  91 79  BILITOT 0.6 0.6  PROT 7.4 6.9  ALBUMIN 2.7* 2.5*    Recent Labs Lab 10/03/16 1345 10/03/16 1355 10/04/16 0619  WBC 7.4  --  7.7  NEUTROABS 4.1  --   --   HGB 9.6* 10.2* 8.7*  HCT 31.6* 30.0* 28.1*  MCV 82.5  --  82.4  PLT 297  --  200   Iron/TIBC/Ferritin/ %Sat    Component Value Date/Time   IRON 31 (L) 02/05/2013 0327   TIBC 155 (L) 02/05/2013 0327   FERRITIN 127 02/05/2013 0327   IRONPCTSAT 20 02/05/2013 0327

## 2016-10-08 DIAGNOSIS — D509 Iron deficiency anemia, unspecified: Secondary | ICD-10-CM | POA: Diagnosis not present

## 2016-10-08 DIAGNOSIS — E1129 Type 2 diabetes mellitus with other diabetic kidney complication: Secondary | ICD-10-CM | POA: Diagnosis not present

## 2016-10-08 DIAGNOSIS — Z23 Encounter for immunization: Secondary | ICD-10-CM | POA: Diagnosis not present

## 2016-10-08 DIAGNOSIS — N186 End stage renal disease: Secondary | ICD-10-CM | POA: Diagnosis not present

## 2016-10-08 DIAGNOSIS — N2581 Secondary hyperparathyroidism of renal origin: Secondary | ICD-10-CM | POA: Diagnosis not present

## 2016-10-08 DIAGNOSIS — D631 Anemia in chronic kidney disease: Secondary | ICD-10-CM | POA: Diagnosis not present

## 2016-10-15 DIAGNOSIS — N186 End stage renal disease: Secondary | ICD-10-CM | POA: Diagnosis not present

## 2016-10-15 DIAGNOSIS — D509 Iron deficiency anemia, unspecified: Secondary | ICD-10-CM | POA: Diagnosis not present

## 2016-10-15 DIAGNOSIS — D631 Anemia in chronic kidney disease: Secondary | ICD-10-CM | POA: Diagnosis not present

## 2016-10-15 DIAGNOSIS — N2581 Secondary hyperparathyroidism of renal origin: Secondary | ICD-10-CM | POA: Diagnosis not present

## 2016-10-15 DIAGNOSIS — Z23 Encounter for immunization: Secondary | ICD-10-CM | POA: Diagnosis not present

## 2016-10-15 DIAGNOSIS — E1129 Type 2 diabetes mellitus with other diabetic kidney complication: Secondary | ICD-10-CM | POA: Diagnosis not present

## 2016-10-17 DIAGNOSIS — E1129 Type 2 diabetes mellitus with other diabetic kidney complication: Secondary | ICD-10-CM | POA: Diagnosis not present

## 2016-10-17 DIAGNOSIS — Z23 Encounter for immunization: Secondary | ICD-10-CM | POA: Diagnosis not present

## 2016-10-17 DIAGNOSIS — D509 Iron deficiency anemia, unspecified: Secondary | ICD-10-CM | POA: Diagnosis not present

## 2016-10-17 DIAGNOSIS — N186 End stage renal disease: Secondary | ICD-10-CM | POA: Diagnosis not present

## 2016-10-17 DIAGNOSIS — N2581 Secondary hyperparathyroidism of renal origin: Secondary | ICD-10-CM | POA: Diagnosis not present

## 2016-10-17 DIAGNOSIS — D631 Anemia in chronic kidney disease: Secondary | ICD-10-CM | POA: Diagnosis not present

## 2016-10-21 DIAGNOSIS — E1122 Type 2 diabetes mellitus with diabetic chronic kidney disease: Secondary | ICD-10-CM | POA: Diagnosis not present

## 2016-10-21 DIAGNOSIS — Z992 Dependence on renal dialysis: Secondary | ICD-10-CM | POA: Diagnosis not present

## 2016-10-21 DIAGNOSIS — N186 End stage renal disease: Secondary | ICD-10-CM | POA: Diagnosis not present

## 2016-10-22 DIAGNOSIS — D509 Iron deficiency anemia, unspecified: Secondary | ICD-10-CM | POA: Diagnosis not present

## 2016-10-22 DIAGNOSIS — E1129 Type 2 diabetes mellitus with other diabetic kidney complication: Secondary | ICD-10-CM | POA: Diagnosis not present

## 2016-10-22 DIAGNOSIS — D631 Anemia in chronic kidney disease: Secondary | ICD-10-CM | POA: Diagnosis not present

## 2016-10-22 DIAGNOSIS — N186 End stage renal disease: Secondary | ICD-10-CM | POA: Diagnosis not present

## 2016-10-22 DIAGNOSIS — N2581 Secondary hyperparathyroidism of renal origin: Secondary | ICD-10-CM | POA: Diagnosis not present

## 2016-10-24 DIAGNOSIS — N186 End stage renal disease: Secondary | ICD-10-CM | POA: Diagnosis not present

## 2016-10-24 DIAGNOSIS — N2581 Secondary hyperparathyroidism of renal origin: Secondary | ICD-10-CM | POA: Diagnosis not present

## 2016-10-24 DIAGNOSIS — D509 Iron deficiency anemia, unspecified: Secondary | ICD-10-CM | POA: Diagnosis not present

## 2016-10-24 DIAGNOSIS — D631 Anemia in chronic kidney disease: Secondary | ICD-10-CM | POA: Diagnosis not present

## 2016-10-24 DIAGNOSIS — E1129 Type 2 diabetes mellitus with other diabetic kidney complication: Secondary | ICD-10-CM | POA: Diagnosis not present

## 2016-10-26 DIAGNOSIS — I10 Essential (primary) hypertension: Secondary | ICD-10-CM | POA: Diagnosis not present

## 2016-10-26 DIAGNOSIS — E119 Type 2 diabetes mellitus without complications: Secondary | ICD-10-CM | POA: Diagnosis not present

## 2016-10-26 DIAGNOSIS — G8929 Other chronic pain: Secondary | ICD-10-CM | POA: Diagnosis not present

## 2016-10-26 DIAGNOSIS — Z5181 Encounter for therapeutic drug level monitoring: Secondary | ICD-10-CM | POA: Diagnosis not present

## 2016-10-27 DIAGNOSIS — E1129 Type 2 diabetes mellitus with other diabetic kidney complication: Secondary | ICD-10-CM | POA: Diagnosis not present

## 2016-10-27 DIAGNOSIS — D509 Iron deficiency anemia, unspecified: Secondary | ICD-10-CM | POA: Diagnosis not present

## 2016-10-27 DIAGNOSIS — N2581 Secondary hyperparathyroidism of renal origin: Secondary | ICD-10-CM | POA: Diagnosis not present

## 2016-10-27 DIAGNOSIS — D631 Anemia in chronic kidney disease: Secondary | ICD-10-CM | POA: Diagnosis not present

## 2016-10-27 DIAGNOSIS — N186 End stage renal disease: Secondary | ICD-10-CM | POA: Diagnosis not present

## 2016-10-29 DIAGNOSIS — N186 End stage renal disease: Secondary | ICD-10-CM | POA: Diagnosis not present

## 2016-10-29 DIAGNOSIS — D509 Iron deficiency anemia, unspecified: Secondary | ICD-10-CM | POA: Diagnosis not present

## 2016-10-29 DIAGNOSIS — N2581 Secondary hyperparathyroidism of renal origin: Secondary | ICD-10-CM | POA: Diagnosis not present

## 2016-10-29 DIAGNOSIS — D631 Anemia in chronic kidney disease: Secondary | ICD-10-CM | POA: Diagnosis not present

## 2016-10-29 DIAGNOSIS — E1129 Type 2 diabetes mellitus with other diabetic kidney complication: Secondary | ICD-10-CM | POA: Diagnosis not present

## 2016-11-03 DIAGNOSIS — N2581 Secondary hyperparathyroidism of renal origin: Secondary | ICD-10-CM | POA: Diagnosis not present

## 2016-11-03 DIAGNOSIS — D509 Iron deficiency anemia, unspecified: Secondary | ICD-10-CM | POA: Diagnosis not present

## 2016-11-03 DIAGNOSIS — E1129 Type 2 diabetes mellitus with other diabetic kidney complication: Secondary | ICD-10-CM | POA: Diagnosis not present

## 2016-11-03 DIAGNOSIS — N186 End stage renal disease: Secondary | ICD-10-CM | POA: Diagnosis not present

## 2016-11-03 DIAGNOSIS — D631 Anemia in chronic kidney disease: Secondary | ICD-10-CM | POA: Diagnosis not present

## 2016-11-05 DIAGNOSIS — D631 Anemia in chronic kidney disease: Secondary | ICD-10-CM | POA: Diagnosis not present

## 2016-11-05 DIAGNOSIS — N2581 Secondary hyperparathyroidism of renal origin: Secondary | ICD-10-CM | POA: Diagnosis not present

## 2016-11-05 DIAGNOSIS — D509 Iron deficiency anemia, unspecified: Secondary | ICD-10-CM | POA: Diagnosis not present

## 2016-11-05 DIAGNOSIS — N186 End stage renal disease: Secondary | ICD-10-CM | POA: Diagnosis not present

## 2016-11-05 DIAGNOSIS — E1129 Type 2 diabetes mellitus with other diabetic kidney complication: Secondary | ICD-10-CM | POA: Diagnosis not present

## 2016-11-10 DIAGNOSIS — D509 Iron deficiency anemia, unspecified: Secondary | ICD-10-CM | POA: Diagnosis not present

## 2016-11-10 DIAGNOSIS — E1129 Type 2 diabetes mellitus with other diabetic kidney complication: Secondary | ICD-10-CM | POA: Diagnosis not present

## 2016-11-10 DIAGNOSIS — D631 Anemia in chronic kidney disease: Secondary | ICD-10-CM | POA: Diagnosis not present

## 2016-11-10 DIAGNOSIS — N2581 Secondary hyperparathyroidism of renal origin: Secondary | ICD-10-CM | POA: Diagnosis not present

## 2016-11-10 DIAGNOSIS — N186 End stage renal disease: Secondary | ICD-10-CM | POA: Diagnosis not present

## 2016-11-11 NOTE — Discharge Summary (Signed)
PATIENT DETAILS Name: Karen [name] Age: 41 y.o. Sex: female Date of Birth: 02-08-1976 MRN: [id number]. Admitting Physician: Benito Mccreedy, MD PCP:Pcp Not In System  Admit Date: 10/03/2016 Discharge date: 10/04/2016  Note:patient left AMA  Recommendations for Outpatient Follow-up:  1. Please encourage compliance with dialysis  PRIMARY DISCHARGE DIAGNOSIS:  Principal Problem:   Fluid overload Active Problems:   Hyperkalemia   Hypertension with fluid overload      PAST MEDICAL HISTORY: Past Medical History:  Diagnosis Date  . Anemia   . Anxiety   . Exertional shortness of breath    "recently; it's fluid" (02/02/2013)  . GERD (gastroesophageal reflux disease)   . History of blood transfusion    "last week" (02/02/2013)  . Hypertension   . Ischemic cardiomyopathy    by echo 2014  . Osteomyelitis of toe of left foot ([place])    "off and on since 2009; no OR" (02/02/2013)  . Renal failure    acute vs chronic/notes 02/02/2013  . Type II diabetes mellitus ([place]) 1995    ALLERGIES:   Allergies  Allergen Reactions  . Morphine And Related Hives and Rash  . Novolog [Insulin Aspart] Other (See Comments)    Cramps/ Gi distress  . Penicillins Hives and Rash    Has patient had a PCN reaction causing immediate rash, facial/tongue/throat swelling, SOB or lightheadedness with hypotension: No Has patient had a PCN reaction causing severe rash involving mucus membranes or skin necrosis: No Has patient had a PCN reaction that required hospitalization No Has patient had a PCN reaction occurring within the last 10 years: No If all of the above answers are "NO", then may proceed with Cephalosporin use. 09/10/16- tolerated Cefepime     BRIEF HPI:  See H&P, Labs, Consult and Test reports for all details in brief, patient Is a 41 year old female with history of ESRD on HD with shortness of breath. She apparently had missed a few dialysis sessions due to family issues.  BRIEF HOSPITAL  COURSE: Shortness of breath: Initially suspected to be due to missed hemodialysis related pulmonary edema, however did not tolerate attempt to remove fluid off during dialysis. After discussion with nephrology, plans were to pursue a CT scan of the chest to delineate whether she had pneumonia or some other pathology. However she signed out [place] ADVICE-she claims she had a death in her family.  ESRD: On hemodialysis-unfortunate noncompliant-nephrology consulted during this hospital stay  Uncontrolled hypertension: Secondary to missed hemodialysis-blood pressure was better controlled after resumption of her usual antihypertensives and dialysis.  Normocytic anemia: Stable. Patient follow-up. Probably secondary to ESRD.  CONSULTATIONS:   nephrology  PERTINENT RADIOLOGIC STUDIES: No results found.   PERTINENT LAB RESULTS: CBC: No results for input(s): WBC, HGB, HCT, PLT in the last 72 hours. CMET CMP     Component Value Date/Time   NA 135 10/04/2016 0619   K 3.9 10/04/2016 0619   CL 101 10/04/2016 0619   CO2 25 10/04/2016 0619   GLUCOSE 113 (H) 10/04/2016 0619   BUN 27 (H) 10/04/2016 0619   CREATININE 6.47 (H) 10/04/2016 0619   CALCIUM 7.7 (L) 10/04/2016 0619   PROT 6.9 10/04/2016 0619   ALBUMIN 2.5 (L) 10/04/2016 0619   AST 9 (L) 10/04/2016 0619   ALT 8 (L) 10/04/2016 0619   ALKPHOS 79 10/04/2016 0619   BILITOT 0.6 10/04/2016 0619   GFRNONAA 7 (L) 10/04/2016 0619   GFRAA 8 (L) 10/04/2016 0619    GFR CrCl cannot be calculated (  Patient's most recent lab result is older than the maximum 21 days allowed.). No results for input(s): LIPASE, AMYLASE in the last 72 hours. No results for input(s): CKTOTAL, CKMB, CKMBINDEX, TROPONINI in the last 72 hours. Invalid input(s): POCBNP No results for input(s): DDIMER in the last 72 hours. No results for input(s): HGBA1C in the last 72 hours. No results for input(s): CHOL, HDL, LDLCALC, TRIG, CHOLHDL, LDLDIRECT in the last 72  hours. No results for input(s): TSH, T4TOTAL, T3FREE, THYROIDAB in the last 72 hours.  Invalid input(s): FREET3 No results for input(s): VITAMINB12, FOLATE, FERRITIN, TIBC, IRON, RETICCTPCT in the last 72 hours. Coags: No results for input(s): INR in the last 72 hours.  Invalid input(s): PT Microbiology: No results found for this or any previous visit (from the past 240 hour(s)).    TODAY-DAY OF DISCHARGE:  Subjective:   [name] today has signed out against medical advice. He was warned about the life threatening and life disabling effects by MD or RN.   Objective:   Blood pressure (!) 141/72, pulse 97, temperature 98.8 F (37.1 C), temperature source Oral, resp. rate 16, height 5' 0.5" (1.537 m), weight 100 kg (220 lb 7.4 oz), last menstrual period 09/21/2016, SpO2 95 %.   DISCHARGE CONDITION: Not stable for discharge-left AMA  DISPOSITION: AMA  Follow with your PCP in 1 week  Total Time spent on discharge equals 25  minutes.  SignedOren Binet 11/11/2016 2:23 PM

## 2016-11-12 DIAGNOSIS — D631 Anemia in chronic kidney disease: Secondary | ICD-10-CM | POA: Diagnosis not present

## 2016-11-12 DIAGNOSIS — D509 Iron deficiency anemia, unspecified: Secondary | ICD-10-CM | POA: Diagnosis not present

## 2016-11-12 DIAGNOSIS — N2581 Secondary hyperparathyroidism of renal origin: Secondary | ICD-10-CM | POA: Diagnosis not present

## 2016-11-12 DIAGNOSIS — E1129 Type 2 diabetes mellitus with other diabetic kidney complication: Secondary | ICD-10-CM | POA: Diagnosis not present

## 2016-11-12 DIAGNOSIS — N186 End stage renal disease: Secondary | ICD-10-CM | POA: Diagnosis not present

## 2016-11-17 DIAGNOSIS — N186 End stage renal disease: Secondary | ICD-10-CM | POA: Diagnosis not present

## 2016-11-17 DIAGNOSIS — N2581 Secondary hyperparathyroidism of renal origin: Secondary | ICD-10-CM | POA: Diagnosis not present

## 2016-11-17 DIAGNOSIS — D509 Iron deficiency anemia, unspecified: Secondary | ICD-10-CM | POA: Diagnosis not present

## 2016-11-17 DIAGNOSIS — D631 Anemia in chronic kidney disease: Secondary | ICD-10-CM | POA: Diagnosis not present

## 2016-11-17 DIAGNOSIS — E1129 Type 2 diabetes mellitus with other diabetic kidney complication: Secondary | ICD-10-CM | POA: Diagnosis not present

## 2016-11-18 DIAGNOSIS — E1122 Type 2 diabetes mellitus with diabetic chronic kidney disease: Secondary | ICD-10-CM | POA: Diagnosis not present

## 2016-11-18 DIAGNOSIS — Z992 Dependence on renal dialysis: Secondary | ICD-10-CM | POA: Diagnosis not present

## 2016-11-18 DIAGNOSIS — N186 End stage renal disease: Secondary | ICD-10-CM | POA: Diagnosis not present

## 2016-11-19 DIAGNOSIS — E1129 Type 2 diabetes mellitus with other diabetic kidney complication: Secondary | ICD-10-CM | POA: Diagnosis not present

## 2016-11-19 DIAGNOSIS — Z23 Encounter for immunization: Secondary | ICD-10-CM | POA: Diagnosis not present

## 2016-11-19 DIAGNOSIS — D509 Iron deficiency anemia, unspecified: Secondary | ICD-10-CM | POA: Diagnosis not present

## 2016-11-19 DIAGNOSIS — N2581 Secondary hyperparathyroidism of renal origin: Secondary | ICD-10-CM | POA: Diagnosis not present

## 2016-11-19 DIAGNOSIS — T8249XA Other complication of vascular dialysis catheter, initial encounter: Secondary | ICD-10-CM | POA: Diagnosis not present

## 2016-11-19 DIAGNOSIS — D631 Anemia in chronic kidney disease: Secondary | ICD-10-CM | POA: Diagnosis not present

## 2016-11-19 DIAGNOSIS — N186 End stage renal disease: Secondary | ICD-10-CM | POA: Diagnosis not present

## 2016-11-20 ENCOUNTER — Encounter: Payer: Self-pay | Admitting: Vascular Surgery

## 2016-11-21 DIAGNOSIS — Z23 Encounter for immunization: Secondary | ICD-10-CM | POA: Diagnosis not present

## 2016-11-21 DIAGNOSIS — D509 Iron deficiency anemia, unspecified: Secondary | ICD-10-CM | POA: Diagnosis not present

## 2016-11-21 DIAGNOSIS — E1129 Type 2 diabetes mellitus with other diabetic kidney complication: Secondary | ICD-10-CM | POA: Diagnosis not present

## 2016-11-21 DIAGNOSIS — N2581 Secondary hyperparathyroidism of renal origin: Secondary | ICD-10-CM | POA: Diagnosis not present

## 2016-11-21 DIAGNOSIS — T8249XA Other complication of vascular dialysis catheter, initial encounter: Secondary | ICD-10-CM | POA: Diagnosis not present

## 2016-11-21 DIAGNOSIS — N186 End stage renal disease: Secondary | ICD-10-CM | POA: Diagnosis not present

## 2016-11-23 DIAGNOSIS — E039 Hypothyroidism, unspecified: Secondary | ICD-10-CM | POA: Diagnosis not present

## 2016-11-23 DIAGNOSIS — E559 Vitamin D deficiency, unspecified: Secondary | ICD-10-CM | POA: Diagnosis not present

## 2016-11-23 DIAGNOSIS — I10 Essential (primary) hypertension: Secondary | ICD-10-CM | POA: Diagnosis not present

## 2016-11-23 DIAGNOSIS — Z5181 Encounter for therapeutic drug level monitoring: Secondary | ICD-10-CM | POA: Diagnosis not present

## 2016-11-23 DIAGNOSIS — E1165 Type 2 diabetes mellitus with hyperglycemia: Secondary | ICD-10-CM | POA: Diagnosis not present

## 2016-11-23 DIAGNOSIS — E782 Mixed hyperlipidemia: Secondary | ICD-10-CM | POA: Diagnosis not present

## 2016-11-23 DIAGNOSIS — E119 Type 2 diabetes mellitus without complications: Secondary | ICD-10-CM | POA: Diagnosis not present

## 2016-11-23 DIAGNOSIS — G8929 Other chronic pain: Secondary | ICD-10-CM | POA: Diagnosis not present

## 2016-11-23 DIAGNOSIS — N186 End stage renal disease: Secondary | ICD-10-CM | POA: Diagnosis not present

## 2016-11-23 DIAGNOSIS — E790 Hyperuricemia without signs of inflammatory arthritis and tophaceous disease: Secondary | ICD-10-CM | POA: Diagnosis not present

## 2016-11-24 DIAGNOSIS — N2581 Secondary hyperparathyroidism of renal origin: Secondary | ICD-10-CM | POA: Diagnosis not present

## 2016-11-24 DIAGNOSIS — N186 End stage renal disease: Secondary | ICD-10-CM | POA: Diagnosis not present

## 2016-11-24 DIAGNOSIS — Z23 Encounter for immunization: Secondary | ICD-10-CM | POA: Diagnosis not present

## 2016-11-24 DIAGNOSIS — D509 Iron deficiency anemia, unspecified: Secondary | ICD-10-CM | POA: Diagnosis not present

## 2016-11-24 DIAGNOSIS — E1129 Type 2 diabetes mellitus with other diabetic kidney complication: Secondary | ICD-10-CM | POA: Diagnosis not present

## 2016-11-24 DIAGNOSIS — T8249XA Other complication of vascular dialysis catheter, initial encounter: Secondary | ICD-10-CM | POA: Diagnosis not present

## 2016-11-24 NOTE — Addendum Note (Signed)
Addended by: Lianne Cure A on: 11/24/2016 09:44 AM   Modules accepted: Orders

## 2016-11-26 DIAGNOSIS — D509 Iron deficiency anemia, unspecified: Secondary | ICD-10-CM | POA: Diagnosis not present

## 2016-11-26 DIAGNOSIS — N186 End stage renal disease: Secondary | ICD-10-CM | POA: Diagnosis not present

## 2016-11-26 DIAGNOSIS — Z23 Encounter for immunization: Secondary | ICD-10-CM | POA: Diagnosis not present

## 2016-11-26 DIAGNOSIS — E1129 Type 2 diabetes mellitus with other diabetic kidney complication: Secondary | ICD-10-CM | POA: Diagnosis not present

## 2016-11-26 DIAGNOSIS — T8249XA Other complication of vascular dialysis catheter, initial encounter: Secondary | ICD-10-CM | POA: Diagnosis not present

## 2016-11-26 DIAGNOSIS — N2581 Secondary hyperparathyroidism of renal origin: Secondary | ICD-10-CM | POA: Diagnosis not present

## 2016-11-28 DIAGNOSIS — E1129 Type 2 diabetes mellitus with other diabetic kidney complication: Secondary | ICD-10-CM | POA: Diagnosis not present

## 2016-11-28 DIAGNOSIS — Z23 Encounter for immunization: Secondary | ICD-10-CM | POA: Diagnosis not present

## 2016-11-28 DIAGNOSIS — D509 Iron deficiency anemia, unspecified: Secondary | ICD-10-CM | POA: Diagnosis not present

## 2016-11-28 DIAGNOSIS — N2581 Secondary hyperparathyroidism of renal origin: Secondary | ICD-10-CM | POA: Diagnosis not present

## 2016-11-28 DIAGNOSIS — N186 End stage renal disease: Secondary | ICD-10-CM | POA: Diagnosis not present

## 2016-11-28 DIAGNOSIS — T8249XA Other complication of vascular dialysis catheter, initial encounter: Secondary | ICD-10-CM | POA: Diagnosis not present

## 2016-11-30 ENCOUNTER — Telehealth: Payer: Self-pay | Admitting: Vascular Surgery

## 2016-11-30 NOTE — Telephone Encounter (Signed)
The patient was originally scheduled for a vein mapping and follow up appointment with Dr. Donnetta Hutching on 3/13. Due to the weather, the patient's appointment was canceled and needs to be scheduled for Dr. Luther Parody soonest appointment

## 2016-12-01 ENCOUNTER — Inpatient Hospital Stay (HOSPITAL_COMMUNITY): Admission: RE | Admit: 2016-12-01 | Payer: Medicare Other | Source: Ambulatory Visit

## 2016-12-01 ENCOUNTER — Ambulatory Visit: Payer: Medicare Other | Admitting: Vascular Surgery

## 2016-12-01 DIAGNOSIS — T8249XA Other complication of vascular dialysis catheter, initial encounter: Secondary | ICD-10-CM | POA: Diagnosis not present

## 2016-12-01 DIAGNOSIS — N2581 Secondary hyperparathyroidism of renal origin: Secondary | ICD-10-CM | POA: Diagnosis not present

## 2016-12-01 DIAGNOSIS — E1129 Type 2 diabetes mellitus with other diabetic kidney complication: Secondary | ICD-10-CM | POA: Diagnosis not present

## 2016-12-01 DIAGNOSIS — N186 End stage renal disease: Secondary | ICD-10-CM | POA: Diagnosis not present

## 2016-12-01 DIAGNOSIS — Z23 Encounter for immunization: Secondary | ICD-10-CM | POA: Diagnosis not present

## 2016-12-01 DIAGNOSIS — D509 Iron deficiency anemia, unspecified: Secondary | ICD-10-CM | POA: Diagnosis not present

## 2016-12-01 NOTE — Telephone Encounter (Signed)
Done

## 2016-12-02 ENCOUNTER — Ambulatory Visit (HOSPITAL_COMMUNITY)
Admission: RE | Admit: 2016-12-02 | Discharge: 2016-12-02 | Disposition: A | Discharge status: Home / Self Care | Payer: Medicare Other | Source: Ambulatory Visit | Attending: Vascular Surgery | Admitting: Vascular Surgery

## 2016-12-02 ENCOUNTER — Inpatient Hospital Stay (HOSPITAL_COMMUNITY): Admission: RE | Admit: 2016-12-02 | Payer: Medicare Other | Source: Ambulatory Visit

## 2016-12-02 DIAGNOSIS — Z992 Dependence on renal dialysis: Secondary | ICD-10-CM | POA: Diagnosis not present

## 2016-12-02 DIAGNOSIS — N186 End stage renal disease: Secondary | ICD-10-CM

## 2016-12-03 ENCOUNTER — Encounter: Payer: Self-pay | Admitting: Vascular Surgery

## 2016-12-04 DIAGNOSIS — D509 Iron deficiency anemia, unspecified: Secondary | ICD-10-CM | POA: Diagnosis not present

## 2016-12-04 DIAGNOSIS — Z23 Encounter for immunization: Secondary | ICD-10-CM | POA: Diagnosis not present

## 2016-12-04 DIAGNOSIS — N186 End stage renal disease: Secondary | ICD-10-CM | POA: Diagnosis not present

## 2016-12-04 DIAGNOSIS — T8249XA Other complication of vascular dialysis catheter, initial encounter: Secondary | ICD-10-CM | POA: Diagnosis not present

## 2016-12-04 DIAGNOSIS — E1129 Type 2 diabetes mellitus with other diabetic kidney complication: Secondary | ICD-10-CM | POA: Diagnosis not present

## 2016-12-04 DIAGNOSIS — N2581 Secondary hyperparathyroidism of renal origin: Secondary | ICD-10-CM | POA: Diagnosis not present

## 2016-12-05 DIAGNOSIS — N2581 Secondary hyperparathyroidism of renal origin: Secondary | ICD-10-CM | POA: Diagnosis not present

## 2016-12-05 DIAGNOSIS — T8249XA Other complication of vascular dialysis catheter, initial encounter: Secondary | ICD-10-CM | POA: Diagnosis not present

## 2016-12-05 DIAGNOSIS — Z23 Encounter for immunization: Secondary | ICD-10-CM | POA: Diagnosis not present

## 2016-12-05 DIAGNOSIS — D509 Iron deficiency anemia, unspecified: Secondary | ICD-10-CM | POA: Diagnosis not present

## 2016-12-05 DIAGNOSIS — N186 End stage renal disease: Secondary | ICD-10-CM | POA: Diagnosis not present

## 2016-12-05 DIAGNOSIS — E1129 Type 2 diabetes mellitus with other diabetic kidney complication: Secondary | ICD-10-CM | POA: Diagnosis not present

## 2016-12-07 ENCOUNTER — Encounter (HOSPITAL_COMMUNITY): Payer: Medicare Other

## 2016-12-07 ENCOUNTER — Inpatient Hospital Stay (HOSPITAL_COMMUNITY): Admission: RE | Admit: 2016-12-07 | Payer: Medicare Other | Source: Ambulatory Visit

## 2016-12-08 ENCOUNTER — Ambulatory Visit (HOSPITAL_COMMUNITY)
Admission: RE | Admit: 2016-12-08 | Discharge: 2016-12-08 | Disposition: A | Discharge status: Home / Self Care | Payer: Medicare Other | Source: Ambulatory Visit | Attending: Vascular Surgery | Admitting: Vascular Surgery

## 2016-12-08 DIAGNOSIS — Z992 Dependence on renal dialysis: Secondary | ICD-10-CM | POA: Diagnosis not present

## 2016-12-08 DIAGNOSIS — N186 End stage renal disease: Secondary | ICD-10-CM | POA: Diagnosis not present

## 2016-12-08 DIAGNOSIS — Z23 Encounter for immunization: Secondary | ICD-10-CM | POA: Diagnosis not present

## 2016-12-08 DIAGNOSIS — T8249XA Other complication of vascular dialysis catheter, initial encounter: Secondary | ICD-10-CM | POA: Diagnosis not present

## 2016-12-08 DIAGNOSIS — E1129 Type 2 diabetes mellitus with other diabetic kidney complication: Secondary | ICD-10-CM | POA: Diagnosis not present

## 2016-12-08 DIAGNOSIS — N2581 Secondary hyperparathyroidism of renal origin: Secondary | ICD-10-CM | POA: Diagnosis not present

## 2016-12-08 DIAGNOSIS — D509 Iron deficiency anemia, unspecified: Secondary | ICD-10-CM | POA: Diagnosis not present

## 2016-12-10 DIAGNOSIS — N2581 Secondary hyperparathyroidism of renal origin: Secondary | ICD-10-CM | POA: Diagnosis not present

## 2016-12-10 DIAGNOSIS — D509 Iron deficiency anemia, unspecified: Secondary | ICD-10-CM | POA: Diagnosis not present

## 2016-12-10 DIAGNOSIS — Z23 Encounter for immunization: Secondary | ICD-10-CM | POA: Diagnosis not present

## 2016-12-10 DIAGNOSIS — E1129 Type 2 diabetes mellitus with other diabetic kidney complication: Secondary | ICD-10-CM | POA: Diagnosis not present

## 2016-12-10 DIAGNOSIS — T8249XA Other complication of vascular dialysis catheter, initial encounter: Secondary | ICD-10-CM | POA: Diagnosis not present

## 2016-12-10 DIAGNOSIS — N186 End stage renal disease: Secondary | ICD-10-CM | POA: Diagnosis not present

## 2016-12-15 ENCOUNTER — Ambulatory Visit: Payer: Medicare Other | Admitting: Vascular Surgery

## 2016-12-15 ENCOUNTER — Ambulatory Visit (INDEPENDENT_AMBULATORY_CARE_PROVIDER_SITE_OTHER): Payer: Medicare Other | Admitting: Vascular Surgery

## 2016-12-15 ENCOUNTER — Other Ambulatory Visit: Payer: Self-pay

## 2016-12-15 ENCOUNTER — Encounter: Payer: Self-pay | Admitting: Vascular Surgery

## 2016-12-15 VITALS — BP 189/107 | HR 98 | Temp 97.5°F | Resp 16 | Ht 66.0 in | Wt 216.0 lb

## 2016-12-15 DIAGNOSIS — Z23 Encounter for immunization: Secondary | ICD-10-CM | POA: Diagnosis not present

## 2016-12-15 DIAGNOSIS — T8249XA Other complication of vascular dialysis catheter, initial encounter: Secondary | ICD-10-CM | POA: Diagnosis not present

## 2016-12-15 DIAGNOSIS — N186 End stage renal disease: Secondary | ICD-10-CM

## 2016-12-15 DIAGNOSIS — N2581 Secondary hyperparathyroidism of renal origin: Secondary | ICD-10-CM | POA: Diagnosis not present

## 2016-12-15 DIAGNOSIS — D509 Iron deficiency anemia, unspecified: Secondary | ICD-10-CM | POA: Diagnosis not present

## 2016-12-15 DIAGNOSIS — Z992 Dependence on renal dialysis: Secondary | ICD-10-CM

## 2016-12-15 DIAGNOSIS — E1129 Type 2 diabetes mellitus with other diabetic kidney complication: Secondary | ICD-10-CM | POA: Diagnosis not present

## 2016-12-15 NOTE — Progress Notes (Signed)
[facility] [place] [facility]  Patient name: Karen [name] MRN: [id number] DOB: 11/19/1975 Sex: female  REASON FOR VISIT: Discuss options for hemodialysis  HPI: Karen [name] is a 41 y.o. female here today for discussion of hemodialysis options. She has a right IJ hemodialysis catheter which is been present for nearly a year. She was seen by Dr. Trula Slade in May 2017. At that time she appeared to have a borderline right basilic vein and no other veins acceptable for fistula in either arm. She had been scheduled for first stage basilic vein fistula and this was canceled on multiple occasions due to extreme hypertension or I blood sugars. She is here today for rescheduling and rediscussion. She recently underwent repeat duplex in  Past Medical History:  Diagnosis Date  . Anemia   . Anxiety   . Exertional shortness of breath    "recently; it's fluid" (02/02/2013)  . GERD (gastroesophageal reflux disease)   . History of blood transfusion    "last week" (02/02/2013)  . Hypertension   . Ischemic cardiomyopathy    by echo 2014  . Osteomyelitis of toe of left foot ([place])    "off and on since 2009; no OR" (02/02/2013)  . Renal failure    acute vs chronic/notes 02/02/2013  . Type II diabetes mellitus ([place]) 1995    Family History  Problem Relation Age of Onset  . Hypertension Mother   . Diabetes Mother   . Diabetes Father     SOCIAL HISTORY: Social History  Substance Use Topics  . Smoking status: Former Smoker    Packs/day: 3.00    Years: 10.00    Types: Cigarettes    Quit date: 09/21/2004  . Smokeless tobacco: Never Used  . Alcohol use No    Allergies  Allergen Reactions  . Morphine And Related Hives and Rash  . Novolog [Insulin Aspart] Other (See Comments)    Cramps/ Gi distress  . Penicillins Hives and Rash    Has patient had a PCN reaction causing immediate rash, facial/tongue/throat swelling, SOB or lightheadedness with  hypotension: No Has patient had a PCN reaction causing severe rash involving mucus membranes or skin necrosis: No Has patient had a PCN reaction that required hospitalization No Has patient had a PCN reaction occurring within the last 10 years: No If all of the above answers are "NO", then may proceed with Cephalosporin use. 09/10/16- tolerated Cefepime     Current Outpatient Prescriptions  Medication Sig Dispense Refill  . amLODipine (NORVASC) 5 MG tablet Take 1 tablet (5 mg total) by mouth daily. 30 tablet 2  . carvedilol (COREG) 25 MG tablet Take 25 mg by mouth 2 (two) times daily with a meal.     . insulin glargine (LANTUS) 100 UNIT/ML injection Inject 18 Units into the skin at bedtime.    Marland Kitchen oxyCODONE-acetaminophen (PERCOCET) 10-325 MG tablet Take 1 tablet by mouth 3 (three) times daily as needed for pain.      No current facility-administered medications for this visit.     REVIEW OF SYSTEMS:  [X]  denotes positive finding, [ ]  denotes negative finding Cardiac  Comments:  Chest pain or chest pressure:    Shortness of breath upon exertion:    Short of breath when lying flat:    Irregular heart rhythm:        Vascular    Pain in calf, thigh, or hip brought on by ambulation:    Pain in feet at night that wakes  you up from your sleep:     Blood clot in your veins:    Leg swelling:           PHYSICAL EXAM: Vitals:   12/15/16 1235  BP: (!) 189/107  Pulse: 98  Resp: 16  Temp: 97.5 F (36.4 C)  SpO2: 100%  Weight: 216 lb (98 kg)  Height: 5\' 6"  (1.676 m)    GENERAL: The patient is a well-nourished female, in no acute distress. The vital signs are documented above. 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 SKIN: Diffuse rash on both upper extremities PSYCHIATRIC: The patient has a normal  affect.  DATA:  Repeat vein map shows 3 mm basilic vein. This is on the right side. I looked at this with SonoSite ultrasound this does appear to be very small and I do not even see a 3 mm vein.  MEDICAL ISSUES: Discussed options with patient. She is left-handed. I would recommend exploration of her right arm. Explained that time we would reimage with SonoSite and if the basilic vein appears to be acceptable for attempted would do a first stage basilic vein fistula. If not we would place a prosthetic graft. I did explain the ongoing issues regarding thromboses of the grafts and fistulas. Also discussed possible non-maturation of the fistula. She understands and wished to proceed as soon as possible    Rosetta Posner, MD FACS [facility] Tel [phone] Pager [phone]

## 2016-12-17 DIAGNOSIS — D509 Iron deficiency anemia, unspecified: Secondary | ICD-10-CM | POA: Diagnosis not present

## 2016-12-17 DIAGNOSIS — T8249XA Other complication of vascular dialysis catheter, initial encounter: Secondary | ICD-10-CM | POA: Diagnosis not present

## 2016-12-17 DIAGNOSIS — N186 End stage renal disease: Secondary | ICD-10-CM | POA: Diagnosis not present

## 2016-12-17 DIAGNOSIS — N2581 Secondary hyperparathyroidism of renal origin: Secondary | ICD-10-CM | POA: Diagnosis not present

## 2016-12-17 DIAGNOSIS — Z23 Encounter for immunization: Secondary | ICD-10-CM | POA: Diagnosis not present

## 2016-12-17 DIAGNOSIS — E1129 Type 2 diabetes mellitus with other diabetic kidney complication: Secondary | ICD-10-CM | POA: Diagnosis not present

## 2016-12-19 DIAGNOSIS — N186 End stage renal disease: Secondary | ICD-10-CM | POA: Diagnosis not present

## 2016-12-19 DIAGNOSIS — T8249XA Other complication of vascular dialysis catheter, initial encounter: Secondary | ICD-10-CM | POA: Diagnosis not present

## 2016-12-19 DIAGNOSIS — Z23 Encounter for immunization: Secondary | ICD-10-CM | POA: Diagnosis not present

## 2016-12-19 DIAGNOSIS — E1129 Type 2 diabetes mellitus with other diabetic kidney complication: Secondary | ICD-10-CM | POA: Diagnosis not present

## 2016-12-19 DIAGNOSIS — E1122 Type 2 diabetes mellitus with diabetic chronic kidney disease: Secondary | ICD-10-CM | POA: Diagnosis not present

## 2016-12-19 DIAGNOSIS — Z992 Dependence on renal dialysis: Secondary | ICD-10-CM | POA: Diagnosis not present

## 2016-12-19 DIAGNOSIS — N2581 Secondary hyperparathyroidism of renal origin: Secondary | ICD-10-CM | POA: Diagnosis not present

## 2016-12-19 DIAGNOSIS — D509 Iron deficiency anemia, unspecified: Secondary | ICD-10-CM | POA: Diagnosis not present

## 2016-12-23 ENCOUNTER — Encounter (HOSPITAL_COMMUNITY): Payer: Self-pay | Admitting: *Deleted

## 2016-12-23 DIAGNOSIS — D509 Iron deficiency anemia, unspecified: Secondary | ICD-10-CM | POA: Diagnosis not present

## 2016-12-23 DIAGNOSIS — E1129 Type 2 diabetes mellitus with other diabetic kidney complication: Secondary | ICD-10-CM | POA: Diagnosis not present

## 2016-12-23 DIAGNOSIS — N186 End stage renal disease: Secondary | ICD-10-CM | POA: Diagnosis not present

## 2016-12-23 DIAGNOSIS — N2581 Secondary hyperparathyroidism of renal origin: Secondary | ICD-10-CM | POA: Diagnosis not present

## 2016-12-23 DIAGNOSIS — D631 Anemia in chronic kidney disease: Secondary | ICD-10-CM | POA: Diagnosis not present

## 2016-12-23 NOTE — Progress Notes (Signed)
Pt denies SOB, chest pain, and being under the care of a cardiologist. Pt denies having a stress test and cardiac cath. Pt made aware to stop taking vitamins, fish oil and herbal medications. Do not take any NSAIDs ie: Ibuprofen, Advil, Naproxen, BC and Goody Powder. Pt stated that MD advised that pt take half dose of Lantus Insulin the night prior to surgery and no insulin DOS. Pt made aware to check BG every 2 hours prior to arrival to hospital, drink 4 ounces of apple juice for BG < 70, wait 15 minutes after drinking juice and recheck BG, if BG remains < 70, call SS. Pt verbalized understanding of all pre-op instructions.

## 2016-12-24 ENCOUNTER — Encounter (HOSPITAL_COMMUNITY): Payer: Self-pay | Admitting: Certified Registered Nurse Anesthetist

## 2016-12-24 DIAGNOSIS — N186 End stage renal disease: Secondary | ICD-10-CM | POA: Diagnosis not present

## 2016-12-24 DIAGNOSIS — D509 Iron deficiency anemia, unspecified: Secondary | ICD-10-CM | POA: Diagnosis not present

## 2016-12-24 DIAGNOSIS — N2581 Secondary hyperparathyroidism of renal origin: Secondary | ICD-10-CM | POA: Diagnosis not present

## 2016-12-24 DIAGNOSIS — E1129 Type 2 diabetes mellitus with other diabetic kidney complication: Secondary | ICD-10-CM | POA: Diagnosis not present

## 2016-12-24 DIAGNOSIS — D631 Anemia in chronic kidney disease: Secondary | ICD-10-CM | POA: Diagnosis not present

## 2016-12-24 MED ORDER — VANCOMYCIN HCL IN DEXTROSE 1-5 GM/200ML-% IV SOLN: 1000.0000 mg | INTRAVENOUS | Status: AC

## 2016-12-24 MED ORDER — SODIUM CHLORIDE 0.9 % IV SOLN
INTRAVENOUS | Status: DC
  Administered 2017-02-08 – 2017-09-10 (×3): via INTRAVENOUS

## 2016-12-24 NOTE — Anesthesia Preprocedure Evaluation (Deleted)
Anesthesia Evaluation    Airway        Dental   Pulmonary pneumonia, resolved, former smoker,           Cardiovascular hypertension, + Peripheral Vascular Disease       Neuro/Psych Anxiety    GI/Hepatic GERD  ,  Endo/Other  diabetes  Renal/GU DialysisRenal disease     Musculoskeletal   Abdominal   Peds  Hematology  (+) anemia ,   Anesthesia Other Findings   Reproductive/Obstetrics                             Anesthesia Physical Anesthesia Plan  ASA: III  Anesthesia Plan: Regional and MAC   Post-op Pain Management:    Induction:   Airway Management Planned:   Additional Equipment:   Intra-op Plan:   Post-operative Plan:   Informed Consent:   Plan Discussed with:   Anesthesia Plan Comments: (Echo 06/2013: Left ventricle: The cavity size was normal. Wall thickness was normal. Systolic function was mildly to moderately reduced. The estimated ejection fraction was in the range of 40% to 45%. There is moderate hypokinesis of the inferoseptal myocardium. Doppler parameters are consistent with a reversible restrictive pattern, indicative of decreased left ventricular diastolic compliance and/or increased left atrial pressure (grade 3 diastolic dysfunction). Doppler parameters are consistent with both elevated ventricular end-diastolic filling pressure and elevated left atrial filling pressure. - Ventricular septum: The contour showed systolic flattening. - Mitral valve: Mildly calcified annulus. Mildly thickened leaflets . Moderate regurgitation. - Left atrium: The atrium was moderately dilated. - Right ventricle: The cavity size was mildly dilated. - Right atrium: The atrium was mildly dilated. - Tricuspid valve: Moderate regurgitation. - Pericardium, extracardiac: A trivial pericardial effusion was identified posterior to the heart)         Anesthesia Quick Evaluation

## 2016-12-25 ENCOUNTER — Emergency Department (HOSPITAL_COMMUNITY)
Admission: EM | Admit: 2016-12-25 | Discharge: 2016-12-25 | Disposition: A | Discharge status: Home / Self Care | Payer: Medicare Other | Attending: Emergency Medicine | Admitting: Emergency Medicine

## 2016-12-25 ENCOUNTER — Ambulatory Visit (HOSPITAL_COMMUNITY): Admission: RE | Admit: 2016-12-25 | Payer: Medicare Other | Source: Ambulatory Visit | Admitting: Vascular Surgery

## 2016-12-25 ENCOUNTER — Encounter (HOSPITAL_COMMUNITY): Payer: Self-pay | Admitting: Emergency Medicine

## 2016-12-25 ENCOUNTER — Other Ambulatory Visit: Payer: Self-pay

## 2016-12-25 ENCOUNTER — Encounter (HOSPITAL_COMMUNITY)
Admission: EM | Disposition: A | Discharge status: Home / Self Care | Payer: Self-pay | Source: Home / Self Care | Attending: Emergency Medicine

## 2016-12-25 DIAGNOSIS — E11649 Type 2 diabetes mellitus with hypoglycemia without coma: Secondary | ICD-10-CM | POA: Insufficient documentation

## 2016-12-25 DIAGNOSIS — Z794 Long term (current) use of insulin: Secondary | ICD-10-CM | POA: Insufficient documentation

## 2016-12-25 DIAGNOSIS — N186 End stage renal disease: Secondary | ICD-10-CM | POA: Insufficient documentation

## 2016-12-25 DIAGNOSIS — E1140 Type 2 diabetes mellitus with diabetic neuropathy, unspecified: Secondary | ICD-10-CM | POA: Insufficient documentation

## 2016-12-25 DIAGNOSIS — Z992 Dependence on renal dialysis: Secondary | ICD-10-CM | POA: Diagnosis not present

## 2016-12-25 DIAGNOSIS — E1122 Type 2 diabetes mellitus with diabetic chronic kidney disease: Secondary | ICD-10-CM | POA: Insufficient documentation

## 2016-12-25 DIAGNOSIS — Z87891 Personal history of nicotine dependence: Secondary | ICD-10-CM | POA: Diagnosis not present

## 2016-12-25 DIAGNOSIS — Z79899 Other long term (current) drug therapy: Secondary | ICD-10-CM | POA: Diagnosis not present

## 2016-12-25 DIAGNOSIS — I120 Hypertensive chronic kidney disease with stage 5 chronic kidney disease or end stage renal disease: Secondary | ICD-10-CM | POA: Diagnosis not present

## 2016-12-25 DIAGNOSIS — E162 Hypoglycemia, unspecified: Secondary | ICD-10-CM

## 2016-12-25 LAB — CBC WITH DIFFERENTIAL/PLATELET
BASOS PCT: 1 %
Basophils Absolute: 0.1 10*3/uL (ref 0.0–0.1)
EOS ABS: 0.5 10*3/uL (ref 0.0–0.7)
Eosinophils Relative: 5 %
HCT: 32.1 % — ABNORMAL LOW (ref 36.0–46.0)
Hemoglobin: 10.1 g/dL — ABNORMAL LOW (ref 12.0–15.0)
Lymphocytes Relative: 24 %
Lymphs Abs: 2.2 10*3/uL (ref 0.7–4.0)
MCH: 25.8 pg — AB (ref 26.0–34.0)
MCHC: 31.5 g/dL (ref 30.0–36.0)
MCV: 82.1 fL (ref 78.0–100.0)
MONO ABS: 0.7 10*3/uL (ref 0.1–1.0)
Monocytes Relative: 8 %
NEUTROS ABS: 5.5 10*3/uL (ref 1.7–7.7)
Neutrophils Relative %: 62 %
PLATELETS: 185 10*3/uL (ref 150–400)
RBC: 3.91 MIL/uL (ref 3.87–5.11)
RDW: 17.6 % — ABNORMAL HIGH (ref 11.5–15.5)
WBC: 9 10*3/uL (ref 4.0–10.5)

## 2016-12-25 LAB — CBG MONITORING, ED
GLUCOSE-CAPILLARY: 26 mg/dL — AB (ref 65–99)
GLUCOSE-CAPILLARY: 67 mg/dL (ref 65–99)
GLUCOSE-CAPILLARY: 76 mg/dL (ref 65–99)
GLUCOSE-CAPILLARY: 87 mg/dL (ref 65–99)
GLUCOSE-CAPILLARY: 192 mg/dL — AB (ref 65–99)
GLUCOSE-CAPILLARY: 296 mg/dL — AB (ref 65–99)
Glucose-Capillary: 109 mg/dL — ABNORMAL HIGH (ref 65–99)
Glucose-Capillary: 80 mg/dL (ref 65–99)
Glucose-Capillary: 151 mg/dL — ABNORMAL HIGH (ref 65–99)
Glucose-Capillary: 321 mg/dL — ABNORMAL HIGH (ref 65–99)

## 2016-12-25 LAB — I-STAT CHEM 8, ED
BUN: 36 mg/dL — ABNORMAL HIGH (ref 6–20)
CHLORIDE: 99 mmol/L — AB (ref 101–111)
Calcium, Ion: 0.95 mmol/L — ABNORMAL LOW (ref 1.15–1.40)
Creatinine, Ser: 5.9 mg/dL — ABNORMAL HIGH (ref 0.44–1.00)
GLUCOSE: 118 mg/dL — AB (ref 65–99)
HCT: 33 % — ABNORMAL LOW (ref 36.0–46.0)
HEMOGLOBIN: 11.2 g/dL — AB (ref 12.0–15.0)
Potassium: 4.5 mmol/L (ref 3.5–5.1)
Sodium: 137 mmol/L (ref 135–145)
TCO2: 31 mmol/L (ref 0–100)

## 2016-12-25 LAB — TROPONIN I: TROPONIN I: 0.04 ng/mL — AB (ref <0.03)

## 2016-12-25 LAB — I-STAT TROPONIN, ED: TROPONIN I, POC: 0.01 ng/mL (ref 0.00–0.08)

## 2016-12-25 SURGERY — ARTERIOVENOUS (AV) FISTULA CREATION
Anesthesia: Monitor Anesthesia Care | Laterality: Right

## 2016-12-25 MED ORDER — MIDAZOLAM HCL 2 MG/2ML IJ SOLN
INTRAMUSCULAR | Status: AC
  Filled 2016-12-25: qty 2

## 2016-12-25 MED ORDER — CARVEDILOL 12.5 MG PO TABS: 6.2500 mg | ORAL_TABLET | Freq: Two times a day (BID) | ORAL | Status: DC

## 2016-12-25 MED ORDER — LIDOCAINE HCL 2 % EX GEL
1.0000 "application " | Freq: Once | CUTANEOUS | Status: AC
  Administered 2016-12-25: 1 via TOPICAL
  Filled 2016-12-25: qty 20

## 2016-12-25 MED ORDER — PROPOFOL 10 MG/ML IV BOLUS
INTRAVENOUS | Status: AC
  Filled 2016-12-25: qty 40

## 2016-12-25 MED ORDER — LISINOPRIL 20 MG PO TABS: 20.0000 mg | ORAL_TABLET | Freq: Two times a day (BID) | ORAL | Status: DC

## 2016-12-25 MED ORDER — DEXTROSE 10 % IV SOLN
INTRAVENOUS | Status: DC
  Administered 2016-12-25: 06:00:00 via INTRAVENOUS

## 2016-12-25 MED ORDER — DEXTROSE 50 % IV SOLN
INTRAVENOUS | Status: AC
  Administered 2016-12-25: 50 mL
  Filled 2016-12-25: qty 100

## 2016-12-25 MED ORDER — FENTANYL CITRATE (PF) 250 MCG/5ML IJ SOLN
INTRAMUSCULAR | Status: AC
  Filled 2016-12-25: qty 5

## 2016-12-25 MED ORDER — LIDOCAINE HCL 2 % EX GEL: 1.0000 "application " | Freq: Once | CUTANEOUS | Status: DC

## 2016-12-25 NOTE — ED Notes (Signed)
Dr. Wyvonnia Dusky made aware of troponin 0.04; Lab reports CMP will need to be recollected not enough blood, MD does not need at this time. Chem-8 available for review. He will try to get in touch with Dr. Donnetta Hutching to determine if procedure will be cancelled or not. Pt just drinking liquids at this time.

## 2016-12-25 NOTE — Discharge Instructions (Signed)
Eat normally. Check your blood sugar and give novolog as prescribed.   See your doctor  Get dialysis tomorrow.  Call Dr. Luther Parody office to reschedule surgery   Return to ER if your CBG is > 500 or < 60, altered, lethargy, fever, vomiting

## 2016-12-25 NOTE — ED Triage Notes (Signed)
Pt dropped off by family, states her sugar was low. Pt was on way to OR. CBG 26. EDP at bedside. Pt altered.

## 2016-12-25 NOTE — ED Notes (Signed)
CBG 321 

## 2016-12-25 NOTE — ED Notes (Signed)
Short stay reports Dr. Donnetta Hutching will NOT be doing fistula placement today. Pt can eat.

## 2016-12-25 NOTE — ED Notes (Signed)
Dr. Darl Householder states Karen [name] ok to be discharged with BP of 196/106. Karen [name] A&OX4, NAD and states she has all of her belongings with her at dc

## 2016-12-25 NOTE — ED Notes (Signed)
Still attempting to maintain IV access. IV team has been paged and en route

## 2016-12-25 NOTE — ED Notes (Signed)
CBG 296. Stopped D10 per Dr. Darl Householder. Pt has eaten breakfast. IO removed.

## 2016-12-25 NOTE — ED Notes (Addendum)
Spoke to [facility] ([id number])GS short stay: pt was supposed to have a R arm fistula placed this morning. Short stay would like to be informed of plan of care when established.

## 2016-12-25 NOTE — ED Provider Notes (Signed)
[facility] DEPT Provider Note   CSN: [id number] Arrival date & time: 12/25/16  0555     History   Chief Complaint Chief Complaint  Patient presents with  . Hypoglycemia    HPI Karen [name] is a 41 y.o. female.  Level V caveat for acuity of condition. Patient brought to ED unresponsive by family members. Apparently was supposed to have AV fistula performed this morning at short stay. Thought to be hypoglycemic. She is a diabetic. She is unresponsive and diaphoretic and unable to give a history.   The history is provided by the patient. The history is limited by the condition of the patient.  Hypoglycemia    Past Medical History:  Diagnosis Date  . Anemia   . Anxiety   . Exertional shortness of breath    "recently; it's fluid" (02/02/2013)  . GERD (gastroesophageal reflux disease)   . History of blood transfusion    "last week" (02/02/2013)  . Hypertension   . Ischemic cardiomyopathy    by echo 2014  . Osteomyelitis of toe of left foot ([place])    "off and on since 2009; no OR" (02/02/2013)  . Renal failure    acute vs chronic/notes 02/02/2013  . Type II diabetes mellitus ([place]) 1995    Patient Active Problem List   Diagnosis Date Noted  . Fluid overload 10/03/2016  . Hyperkalemia 10/03/2016  . Hypertension with fluid overload 10/03/2016  . Anemia in other chronic diseases classified elsewhere 09/08/2016  . High anion gap metabolic acidosis 65/78/4696  . Right lower lobe pneumonia ([place]) 09/08/2016  . Uncontrolled type 2 diabetes mellitus with chronic kidney disease on chronic dialysis, with long-term current use of insulin ([place]) 01/31/2016  . Noncompliance with medications   . End-stage renal disease on hemodialysis ([place])   . Hx of BKA ([place]) 07/03/2013  . S/P BKA (below knee amputation) unilateral ([place]) 06/30/2013  . Physical deconditioning 06/30/2013  . Cardiomyopathy, ischemic - EF 45-50% with inf WMA by 2D 02/05/13 02/06/2013  . Essential hypertension 02/05/2013   . PAD (peripheral artery disease) ([place]) 02/05/2013  . Diabetic neuropathy ([place])   . Osteomyelitis of left great toe - S/P amputation 02/06/13 04/21/2011    Past Surgical History:  Procedure Laterality Date  . AMPUTATION Left 02/06/2013   Procedure: AMPUTATION LEFT GREAT TOE;  Surgeon: Wylene Simmer, MD;  Location: [facility];  Service: Orthopedics;  Laterality: Left;  . AMPUTATION Left 06/24/2013   Procedure: AMPUTATION BELOW KNEE ;  Surgeon: Wylene Simmer, MD;  Location: [facility];  Service: Orthopedics;  Laterality: Left;  . CESAREAN SECTION  10/18/2006  . INSERTION OF DIALYSIS CATHETER      OB History    No data available       Home Medications    Prior to Admission medications   Medication Sig Start Date End Date Taking? Authorizing Provider  calcium acetate (PHOSLO) 667 MG capsule Take 1,334 mg by mouth 3 (three) times daily with meals. 11/04/16   Historical Provider, MD  carvedilol (COREG) 25 MG tablet Take 25 mg by mouth 2 (two) times daily with a meal.  01/14/16   Historical Provider, MD  insulin glargine (LANTUS) 100 UNIT/ML injection Inject 18 Units into the skin at bedtime.    Historical Provider, MD  insulin lispro (HUMALOG) 100 UNIT/ML injection Inject 2-5 Units into the skin 3 (three) times daily as needed for high blood sugar.    Historical Provider, MD  lisinopril (PRINIVIL,ZESTRIL) 20 MG tablet Take 20 mg by mouth  2 (two) times daily. 11/24/16   Historical Provider, MD  oxyCODONE-acetaminophen (PERCOCET) 10-325 MG tablet Take 1 tablet by mouth 3 (three) times daily as needed for pain.  09/25/16   Historical Provider, MD  pantoprazole (PROTONIX) 40 MG tablet Take 40 mg by mouth daily. 11/26/16   Historical Provider, MD    Family History Family History  Problem Relation Age of Onset  . Hypertension Mother   . Diabetes Mother   . Diabetes Father     Social History Social History  Substance Use Topics  . Smoking status: Former Smoker    Packs/day: 3.00    Years: 10.00    Types:  Cigarettes    Quit date: 09/21/2004  . Smokeless tobacco: Never Used  . Alcohol use No     Allergies   Morphine and related; Novolog [insulin aspart]; and Penicillins   Review of Systems Review of Systems  Unable to perform ROS: Acuity of condition     Physical Exam Updated Vital Signs BP (!) 219/119   Pulse 73   Resp 12   Ht 5\' 6"  (1.676 m)   Wt 215 lb (97.5 kg)   LMP 12/18/2016 (Within Days)   SpO2 100%   BMI 34.70 kg/m   Physical Exam  Constitutional: She appears well-developed and well-nourished. No distress.  Obtunded, cold and Clammy, not speaking or following commands  HENT:  Head: Normocephalic and atraumatic.  Mouth/Throat: Oropharynx is clear and moist. No oropharyngeal exudate.  Eyes: EOM are normal. Pupils are equal, round, and reactive to light.  Neck: Normal range of motion.  Cardiovascular: Normal rate, regular rhythm and normal heart sounds.   No murmur heard. Pulmonary/Chest: Effort normal. No respiratory distress. She exhibits no tenderness.  Dialysis catheter R chest.  Abdominal: Soft. She exhibits no mass. There is no tenderness. There is no rebound.  Musculoskeletal: Normal range of motion. She exhibits no edema, tenderness or deformity.  L BKA  Neurological:  Obtunded, not following commands  Skin: Skin is warm. Capillary refill takes less than 2 seconds. No rash noted. No erythema.     ED Treatments / Results  Labs (all labs ordered are listed, but only abnormal results are displayed) Labs Reviewed  CBG MONITORING, ED - Abnormal; Notable for the following:       Result Value   Glucose-Capillary 26 (*)    All other components within normal limits  CBG MONITORING, ED - Abnormal; Notable for the following:    Glucose-Capillary 109 (*)    All other components within normal limits  I-STAT CHEM 8, ED - Abnormal; Notable for the following:    Chloride 99 (*)    BUN 36 (*)    Creatinine, Ser 5.90 (*)    Glucose, Bld 118 (*)    Calcium,  Ion 0.95 (*)    Hemoglobin 11.2 (*)    HCT 33.0 (*)    All other components within normal limits  CBC WITH DIFFERENTIAL/PLATELET  COMPREHENSIVE METABOLIC PANEL  TROPONIN I  CBG MONITORING, ED  CBG MONITORING, ED    EKG  EKG Interpretation  Date/Time:  Friday December 25 2016 06:13:42 EDT Ventricular Rate:  83 PR Interval:    QRS Duration: 117 QT Interval:  428 QTC Calculation: 503 R Axis:   77 Text Interpretation:  Sinus rhythm Nonspecific intraventricular conduction delay Nonspecific repol abnormality, diffuse leads Baseline wander in lead(s) III Nonspecific ST abnormality Confirmed by Wyvonnia Dusky  MD, Furman Trentman ([id number]) on 12/25/2016 6:43:38 AM  Radiology No results found.  Procedures IO LINE INSERTION Date/Time: 12/25/2016 6:53 AM Performed by: Ezequiel Essex Authorized by: Ezequiel Essex   Consent:    Consent obtained:  Emergent situation Pre-procedure details:    Site preparation:  Alcohol   Preparation: Patient was prepped and draped in usual sterile fashion   Anesthesia (see MAR for exact dosages):    Anesthesia method:  None Procedure details:    Insertion site:  R proximal tibia   Insertion device:  Drill device   Insertion: Needle was inserted through the bony cortex     Number of attempts:  1   Insertion confirmation:  Aspiration of blood/marrow, easy infusion of fluids and stability of the needle Post-procedure details:    Secured with:  Elastic bandage and protective shield   Patient tolerance of procedure:  Tolerated well, no immediate complications   (including critical care time)  Medications Ordered in ED Medications  dextrose 10 % infusion ( Intravenous New Bag/Given 12/25/16 0629)  dextrose 50 % solution (50 mLs  Given 12/25/16 0609)     Initial Impression / Assessment and Plan / ED Course  I have reviewed the triage vital signs and the nursing notes.  Pertinent labs & imaging results that were available during my care of the patient were  reviewed by me and considered in my medical decision making (see chart for details).     Patient brought to trauma room unresponsive. She is cold and clammy. She is hypertensive. Blood sugar is 27.  Difficult IV access. IO placed emergently. D50 given with improvement in mental status.  D10 gtt started.  Labs obtained.   Mental status improving.  Patient alert and answering questions.  States she took 2.5 units of humalog this morning and 14 units of lantus last night.  Did not eat this morning in anticipation of surgery.  Will feed patient.  Wean D10.  Monitor blood sugars for stability.  Will need to reassess mental status and blood sugars before disposition  Dr. Darl Householder to assume care at shift change.   CRITICAL CARE Performed by: Ezequiel Essex Total critical care time: 35 minutes Critical care time was exclusive of separately billable procedures and treating other patients. Critical care was necessary to treat or prevent imminent or life-threatening deterioration. Critical care was time spent personally by me on the following activities: development of treatment plan with patient and/or surrogate as well as nursing, discussions with consultants, evaluation of patient's response to treatment, examination of patient, obtaining history from patient or surrogate, ordering and performing treatments and interventions, ordering and review of laboratory studies, ordering and review of radiographic studies, pulse oximetry and re-evaluation of patient's condition.   Final Clinical Impressions(s) / ED Diagnoses   Final diagnoses:  None    New Prescriptions New Prescriptions   No medications on file     Ezequiel Essex, MD 12/25/16 [phone]

## 2016-12-25 NOTE — Consult Note (Signed)
Patient presented this morning for elective AV access. Was unsteady and somnolent. Blood sugar was in the mid 20s. She was sent to the emergency room for further evaluation. Her elective access will be canceled for today and she will be rescheduled when she is medically stable.

## 2016-12-25 NOTE — ED Notes (Signed)
Dr. Darl Householder made aware of BP 165/95. Reports to hold BP meds for now. Pt given [place] sandwich.

## 2016-12-25 NOTE — ED Provider Notes (Signed)
  Physical Exam  BP (!) 176/92   Pulse 79   Resp 19   Ht 5\' 6"  (1.676 m)   Wt 215 lb (97.5 kg)   LMP 12/18/2016 (Within Days)   SpO2 100%   BMI 34.70 kg/m   Physical Exam  ED Course  Procedures  MDM Care assumed at 7 am. Patient is a dialysis patient here with AMS, hypoglycemia. CBG was 26 on arrival, started on D10 drip. Has been NPO since midnight since she is scheduled to have fistula placed today. She took lantus last night. R IO placed by Dr. Wyvonnia Dusky. Sign out pending PO trial and follow up serial CBG.   7:30 am I placed [place] IV R upper arm. D10 switched to peripheral IV. Dr. Luther Parody nurse called and surgery is canceled. Will start to PO trial.   9:30 am CBG 296. D10 drip stopped. Will continue to observe   11:21 AM CBG now 321, alert and awake. Felt better. Will dc home. Told her to eat normally and give novolog per her sliding scale and check sugars often. Return if altered or CBG < 60 or > 500. She should call Dr. Donnetta Hutching to reschedule surgery. Dialysis tomorrow as scheduled    Angiocath insertion Performed by: Wandra Arthurs  Consent: Verbal consent obtained. Risks and benefits: risks, benefits and alternatives were discussed Time out: Immediately prior to procedure a "time out" was called to verify the correct patient, procedure, equipment, support staff and site/side marked as required.  Preparation: Patient was prepped and draped in the usual sterile fashion.  Vein Location: R upper arm  Ultrasound Guided  Gauge: 20 long   Normal blood return and flush without difficulty Patient tolerance: Patient tolerated the procedure well with no immediate complications.         Drenda Freeze, MD 12/25/16 1122

## 2016-12-25 NOTE — ED Notes (Signed)
Pt now responding after amp of D50. Pt oriented. EDP at beside attempting to place [place] IV.

## 2016-12-25 NOTE — ED Notes (Signed)
Multiple IV attempts by MD and RNs; IV team at beside.

## 2016-12-29 ENCOUNTER — Telehealth: Payer: Self-pay | Admitting: Vascular Surgery

## 2016-12-29 DIAGNOSIS — D509 Iron deficiency anemia, unspecified: Secondary | ICD-10-CM | POA: Diagnosis not present

## 2016-12-29 DIAGNOSIS — N186 End stage renal disease: Secondary | ICD-10-CM | POA: Diagnosis not present

## 2016-12-29 DIAGNOSIS — E1129 Type 2 diabetes mellitus with other diabetic kidney complication: Secondary | ICD-10-CM | POA: Diagnosis not present

## 2016-12-29 DIAGNOSIS — R197 Diarrhea, unspecified: Secondary | ICD-10-CM | POA: Insufficient documentation

## 2016-12-29 DIAGNOSIS — D631 Anemia in chronic kidney disease: Secondary | ICD-10-CM | POA: Diagnosis not present

## 2016-12-29 DIAGNOSIS — N2581 Secondary hyperparathyroidism of renal origin: Secondary | ICD-10-CM | POA: Diagnosis not present

## 2016-12-29 NOTE — Telephone Encounter (Signed)
Patient left a message with the answering service to reschedule her AV procedure that was cancelled on 4/6.

## 2016-12-31 DIAGNOSIS — D631 Anemia in chronic kidney disease: Secondary | ICD-10-CM | POA: Diagnosis not present

## 2016-12-31 DIAGNOSIS — N2581 Secondary hyperparathyroidism of renal origin: Secondary | ICD-10-CM | POA: Diagnosis not present

## 2016-12-31 DIAGNOSIS — N186 End stage renal disease: Secondary | ICD-10-CM | POA: Diagnosis not present

## 2016-12-31 DIAGNOSIS — E1129 Type 2 diabetes mellitus with other diabetic kidney complication: Secondary | ICD-10-CM | POA: Diagnosis not present

## 2016-12-31 DIAGNOSIS — D509 Iron deficiency anemia, unspecified: Secondary | ICD-10-CM | POA: Diagnosis not present

## 2017-01-01 ENCOUNTER — Other Ambulatory Visit: Payer: Self-pay

## 2017-01-01 NOTE — Telephone Encounter (Signed)
Phone call to pt.  Discussed importance of scheduling appt. with PCP for f/u on blood sugar and blood pressure, prior to having surgery for placement of AVF or AVG.  Advised that her last BP in ER was 196/106 on 12/25/16.  Pt. stated she would like to pick a date for surgery, and then she will be sure to get appt. with her PCP prior to that date.  Offered surgery date for 01/18/17.  Pt. agreed.  Will fax instructions to the [facility].  Pt. Verb. Understanding.

## 2017-01-05 DIAGNOSIS — N2581 Secondary hyperparathyroidism of renal origin: Secondary | ICD-10-CM | POA: Diagnosis not present

## 2017-01-05 DIAGNOSIS — E1129 Type 2 diabetes mellitus with other diabetic kidney complication: Secondary | ICD-10-CM | POA: Diagnosis not present

## 2017-01-05 DIAGNOSIS — D631 Anemia in chronic kidney disease: Secondary | ICD-10-CM | POA: Diagnosis not present

## 2017-01-05 DIAGNOSIS — N186 End stage renal disease: Secondary | ICD-10-CM | POA: Diagnosis not present

## 2017-01-05 DIAGNOSIS — D509 Iron deficiency anemia, unspecified: Secondary | ICD-10-CM | POA: Diagnosis not present

## 2017-01-07 DIAGNOSIS — D509 Iron deficiency anemia, unspecified: Secondary | ICD-10-CM | POA: Diagnosis not present

## 2017-01-07 DIAGNOSIS — N2581 Secondary hyperparathyroidism of renal origin: Secondary | ICD-10-CM | POA: Diagnosis not present

## 2017-01-07 DIAGNOSIS — N186 End stage renal disease: Secondary | ICD-10-CM | POA: Diagnosis not present

## 2017-01-07 DIAGNOSIS — D631 Anemia in chronic kidney disease: Secondary | ICD-10-CM | POA: Diagnosis not present

## 2017-01-07 DIAGNOSIS — E1129 Type 2 diabetes mellitus with other diabetic kidney complication: Secondary | ICD-10-CM | POA: Diagnosis not present

## 2017-01-12 DIAGNOSIS — D631 Anemia in chronic kidney disease: Secondary | ICD-10-CM | POA: Diagnosis not present

## 2017-01-12 DIAGNOSIS — D509 Iron deficiency anemia, unspecified: Secondary | ICD-10-CM | POA: Diagnosis not present

## 2017-01-12 DIAGNOSIS — N186 End stage renal disease: Secondary | ICD-10-CM | POA: Diagnosis not present

## 2017-01-12 DIAGNOSIS — E1129 Type 2 diabetes mellitus with other diabetic kidney complication: Secondary | ICD-10-CM | POA: Diagnosis not present

## 2017-01-12 DIAGNOSIS — N2581 Secondary hyperparathyroidism of renal origin: Secondary | ICD-10-CM | POA: Diagnosis not present

## 2017-01-14 ENCOUNTER — Encounter (HOSPITAL_COMMUNITY): Payer: Self-pay | Admitting: *Deleted

## 2017-01-15 MED ORDER — SODIUM CHLORIDE 0.9 % IV SOLN: INTRAVENOUS | Status: DC

## 2017-01-15 MED ORDER — VANCOMYCIN HCL 10 G IV SOLR
1500.0000 mg | INTRAVENOUS | Status: AC
  Filled 2017-01-15: qty 1500

## 2017-01-16 DIAGNOSIS — N2581 Secondary hyperparathyroidism of renal origin: Secondary | ICD-10-CM | POA: Diagnosis not present

## 2017-01-16 DIAGNOSIS — E1129 Type 2 diabetes mellitus with other diabetic kidney complication: Secondary | ICD-10-CM | POA: Diagnosis not present

## 2017-01-16 DIAGNOSIS — D631 Anemia in chronic kidney disease: Secondary | ICD-10-CM | POA: Diagnosis not present

## 2017-01-16 DIAGNOSIS — N186 End stage renal disease: Secondary | ICD-10-CM | POA: Diagnosis not present

## 2017-01-16 DIAGNOSIS — D509 Iron deficiency anemia, unspecified: Secondary | ICD-10-CM | POA: Diagnosis not present

## 2017-01-18 ENCOUNTER — Ambulatory Visit (HOSPITAL_COMMUNITY): Admission: RE | Admit: 2017-01-18 | Payer: Medicare Other | Source: Ambulatory Visit | Admitting: Vascular Surgery

## 2017-01-18 ENCOUNTER — Encounter (HOSPITAL_COMMUNITY): Payer: Self-pay | Admitting: Certified Registered"

## 2017-01-18 DIAGNOSIS — Z992 Dependence on renal dialysis: Secondary | ICD-10-CM | POA: Diagnosis not present

## 2017-01-18 DIAGNOSIS — N186 End stage renal disease: Secondary | ICD-10-CM | POA: Diagnosis not present

## 2017-01-18 DIAGNOSIS — E1122 Type 2 diabetes mellitus with diabetic chronic kidney disease: Secondary | ICD-10-CM | POA: Diagnosis not present

## 2017-01-18 HISTORY — DX: Dermatitis, unspecified: L30.9

## 2017-01-18 HISTORY — DX: Personal history of urinary calculi: Z87.442

## 2017-01-18 HISTORY — DX: Polyneuropathy, unspecified: G62.9

## 2017-01-18 HISTORY — DX: End stage renal disease: N18.6

## 2017-01-18 HISTORY — DX: Pneumonia, unspecified organism: J18.9

## 2017-01-18 HISTORY — DX: Dyspnea, unspecified: R06.00

## 2017-01-18 SURGERY — ARTERIOVENOUS (AV) FISTULA CREATION
Anesthesia: Monitor Anesthesia Care | Site: Arm Lower | Laterality: Right

## 2017-01-19 DIAGNOSIS — N186 End stage renal disease: Secondary | ICD-10-CM | POA: Diagnosis not present

## 2017-01-19 DIAGNOSIS — E1129 Type 2 diabetes mellitus with other diabetic kidney complication: Secondary | ICD-10-CM | POA: Diagnosis not present

## 2017-01-19 DIAGNOSIS — D631 Anemia in chronic kidney disease: Secondary | ICD-10-CM | POA: Diagnosis not present

## 2017-01-19 DIAGNOSIS — N2581 Secondary hyperparathyroidism of renal origin: Secondary | ICD-10-CM | POA: Diagnosis not present

## 2017-01-19 DIAGNOSIS — D509 Iron deficiency anemia, unspecified: Secondary | ICD-10-CM | POA: Diagnosis not present

## 2017-01-20 ENCOUNTER — Other Ambulatory Visit: Payer: Self-pay

## 2017-01-21 DIAGNOSIS — N186 End stage renal disease: Secondary | ICD-10-CM | POA: Diagnosis not present

## 2017-01-21 DIAGNOSIS — D509 Iron deficiency anemia, unspecified: Secondary | ICD-10-CM | POA: Diagnosis not present

## 2017-01-21 DIAGNOSIS — N2581 Secondary hyperparathyroidism of renal origin: Secondary | ICD-10-CM | POA: Diagnosis not present

## 2017-01-21 DIAGNOSIS — E1129 Type 2 diabetes mellitus with other diabetic kidney complication: Secondary | ICD-10-CM | POA: Diagnosis not present

## 2017-01-21 DIAGNOSIS — D631 Anemia in chronic kidney disease: Secondary | ICD-10-CM | POA: Diagnosis not present

## 2017-01-26 ENCOUNTER — Telehealth: Payer: Self-pay

## 2017-01-26 ENCOUNTER — Other Ambulatory Visit: Payer: Self-pay

## 2017-01-26 DIAGNOSIS — D631 Anemia in chronic kidney disease: Secondary | ICD-10-CM | POA: Diagnosis not present

## 2017-01-26 DIAGNOSIS — N2581 Secondary hyperparathyroidism of renal origin: Secondary | ICD-10-CM | POA: Diagnosis not present

## 2017-01-26 DIAGNOSIS — N186 End stage renal disease: Secondary | ICD-10-CM | POA: Diagnosis not present

## 2017-01-26 DIAGNOSIS — E1129 Type 2 diabetes mellitus with other diabetic kidney complication: Secondary | ICD-10-CM | POA: Diagnosis not present

## 2017-01-26 DIAGNOSIS — D509 Iron deficiency anemia, unspecified: Secondary | ICD-10-CM | POA: Diagnosis not present

## 2017-01-26 NOTE — Progress Notes (Signed)
Pt stated that she is going to reschedule procedure. Pt advised to call the surgeons office to make them aware. LVM with Colletta Maryland, Surgical Coordinator, today, to make aware that pt told nurse that she planned to call and cancel procedure.

## 2017-01-26 NOTE — Progress Notes (Signed)
Spoke with pt again, she stated that she will call the office tomorrow; she is cancelling her procedure.

## 2017-01-26 NOTE — Telephone Encounter (Signed)
Received message from Allyne Gee, RN regarding patient's procedure that is scheduled for tomorrow. Left voicemail message for [name] to return call to [facility].

## 2017-01-27 ENCOUNTER — Ambulatory Visit (HOSPITAL_COMMUNITY): Admission: RE | Admit: 2017-01-27 | Payer: Medicare Other | Source: Ambulatory Visit | Admitting: Vascular Surgery

## 2017-01-27 SURGERY — ARTERIOVENOUS (AV) FISTULA CREATION
Anesthesia: Monitor Anesthesia Care | Laterality: Right

## 2017-01-27 NOTE — Progress Notes (Signed)
Per previous note, patient was to call Dr. Luther Parody office to cancel procedure.  At this time, Dr. Luther Parody office has not heard from patient but patient has not arrived to short stay.  Dr. Donnetta Hutching aware.

## 2017-01-28 ENCOUNTER — Other Ambulatory Visit: Payer: Self-pay

## 2017-01-28 DIAGNOSIS — D509 Iron deficiency anemia, unspecified: Secondary | ICD-10-CM | POA: Diagnosis not present

## 2017-01-28 DIAGNOSIS — N186 End stage renal disease: Secondary | ICD-10-CM | POA: Diagnosis not present

## 2017-01-28 DIAGNOSIS — N2581 Secondary hyperparathyroidism of renal origin: Secondary | ICD-10-CM | POA: Diagnosis not present

## 2017-01-28 DIAGNOSIS — E1129 Type 2 diabetes mellitus with other diabetic kidney complication: Secondary | ICD-10-CM | POA: Diagnosis not present

## 2017-01-28 DIAGNOSIS — D631 Anemia in chronic kidney disease: Secondary | ICD-10-CM | POA: Diagnosis not present

## 2017-02-02 DIAGNOSIS — D509 Iron deficiency anemia, unspecified: Secondary | ICD-10-CM | POA: Diagnosis not present

## 2017-02-02 DIAGNOSIS — E1129 Type 2 diabetes mellitus with other diabetic kidney complication: Secondary | ICD-10-CM | POA: Diagnosis not present

## 2017-02-02 DIAGNOSIS — N186 End stage renal disease: Secondary | ICD-10-CM | POA: Diagnosis not present

## 2017-02-02 DIAGNOSIS — D631 Anemia in chronic kidney disease: Secondary | ICD-10-CM | POA: Diagnosis not present

## 2017-02-02 DIAGNOSIS — N2581 Secondary hyperparathyroidism of renal origin: Secondary | ICD-10-CM | POA: Diagnosis not present

## 2017-02-04 DIAGNOSIS — E1129 Type 2 diabetes mellitus with other diabetic kidney complication: Secondary | ICD-10-CM | POA: Diagnosis not present

## 2017-02-04 DIAGNOSIS — D631 Anemia in chronic kidney disease: Secondary | ICD-10-CM | POA: Diagnosis not present

## 2017-02-04 DIAGNOSIS — D509 Iron deficiency anemia, unspecified: Secondary | ICD-10-CM | POA: Diagnosis not present

## 2017-02-04 DIAGNOSIS — N2581 Secondary hyperparathyroidism of renal origin: Secondary | ICD-10-CM | POA: Diagnosis not present

## 2017-02-04 DIAGNOSIS — N186 End stage renal disease: Secondary | ICD-10-CM | POA: Diagnosis not present

## 2017-02-05 NOTE — Progress Notes (Signed)
Unable to complete pre-op assessments; please complete DOS. Pt was attending a funeral and requested that pre-op instructions be left on voice mailbox. Pt made aware to take half dose of Lantus insulin Sunday at HS, Check BG every 2 hours prior to arrival on DOS, interventions for BG< 70 , recheck BG 15 minutes after intervention and call SS unit and speak to a nurse if BG remains < 70 after intervention.

## 2017-02-06 DIAGNOSIS — D631 Anemia in chronic kidney disease: Secondary | ICD-10-CM | POA: Diagnosis not present

## 2017-02-06 DIAGNOSIS — N2581 Secondary hyperparathyroidism of renal origin: Secondary | ICD-10-CM | POA: Diagnosis not present

## 2017-02-06 DIAGNOSIS — N186 End stage renal disease: Secondary | ICD-10-CM | POA: Diagnosis not present

## 2017-02-06 DIAGNOSIS — D509 Iron deficiency anemia, unspecified: Secondary | ICD-10-CM | POA: Diagnosis not present

## 2017-02-06 DIAGNOSIS — E1129 Type 2 diabetes mellitus with other diabetic kidney complication: Secondary | ICD-10-CM | POA: Diagnosis not present

## 2017-02-08 ENCOUNTER — Ambulatory Visit (HOSPITAL_COMMUNITY): Payer: Medicare Other | Admitting: Anesthesiology

## 2017-02-08 ENCOUNTER — Ambulatory Visit (HOSPITAL_COMMUNITY)
Admission: RE | Admit: 2017-02-08 | Discharge: 2017-02-08 | Disposition: A | Discharge status: Home / Self Care | Payer: Medicare Other | Source: Ambulatory Visit | Attending: Vascular Surgery | Admitting: Vascular Surgery

## 2017-02-08 ENCOUNTER — Encounter (HOSPITAL_COMMUNITY): Payer: Self-pay | Admitting: Anesthesiology

## 2017-02-08 ENCOUNTER — Encounter (HOSPITAL_COMMUNITY)
Admission: RE | Disposition: A | Discharge status: Home / Self Care | Payer: Self-pay | Source: Ambulatory Visit | Attending: Vascular Surgery

## 2017-02-08 DIAGNOSIS — E119 Type 2 diabetes mellitus without complications: Secondary | ICD-10-CM | POA: Diagnosis not present

## 2017-02-08 DIAGNOSIS — K219 Gastro-esophageal reflux disease without esophagitis: Secondary | ICD-10-CM | POA: Insufficient documentation

## 2017-02-08 DIAGNOSIS — Z87891 Personal history of nicotine dependence: Secondary | ICD-10-CM | POA: Diagnosis not present

## 2017-02-08 DIAGNOSIS — J449 Chronic obstructive pulmonary disease, unspecified: Secondary | ICD-10-CM | POA: Diagnosis not present

## 2017-02-08 DIAGNOSIS — Z880 Allergy status to penicillin: Secondary | ICD-10-CM | POA: Diagnosis not present

## 2017-02-08 DIAGNOSIS — F419 Anxiety disorder, unspecified: Secondary | ICD-10-CM | POA: Diagnosis not present

## 2017-02-08 DIAGNOSIS — Z794 Long term (current) use of insulin: Secondary | ICD-10-CM | POA: Insufficient documentation

## 2017-02-08 DIAGNOSIS — E1122 Type 2 diabetes mellitus with diabetic chronic kidney disease: Secondary | ICD-10-CM | POA: Insufficient documentation

## 2017-02-08 DIAGNOSIS — I120 Hypertensive chronic kidney disease with stage 5 chronic kidney disease or end stage renal disease: Secondary | ICD-10-CM | POA: Diagnosis not present

## 2017-02-08 DIAGNOSIS — N186 End stage renal disease: Secondary | ICD-10-CM | POA: Insufficient documentation

## 2017-02-08 DIAGNOSIS — Z885 Allergy status to narcotic agent status: Secondary | ICD-10-CM | POA: Diagnosis not present

## 2017-02-08 DIAGNOSIS — N185 Chronic kidney disease, stage 5: Secondary | ICD-10-CM | POA: Diagnosis not present

## 2017-02-08 HISTORY — PX: AV FISTULA PLACEMENT: SHX1204

## 2017-02-08 LAB — POCT I-STAT 4, (NA,K, GLUC, HGB,HCT)
Glucose, Bld: 152 mg/dL — ABNORMAL HIGH (ref 65–99)
HCT: 37 % (ref 36.0–46.0)
Hemoglobin: 12.6 g/dL (ref 12.0–15.0)
Potassium: 3.7 mmol/L (ref 3.5–5.1)
SODIUM: 138 mmol/L (ref 135–145)

## 2017-02-08 LAB — GLUCOSE, CAPILLARY
GLUCOSE-CAPILLARY: 139 mg/dL — AB (ref 65–99)
GLUCOSE-CAPILLARY: 131 mg/dL — AB (ref 65–99)

## 2017-02-08 LAB — I-STAT BETA HCG BLOOD, ED (NOT ORDERABLE): I-stat hCG, quantitative: <5 m[IU]/mL (ref <5)

## 2017-02-08 SURGERY — ARTERIOVENOUS (AV) FISTULA CREATION
Anesthesia: Monitor Anesthesia Care | Site: Arm Upper | Laterality: Right

## 2017-02-08 MED ORDER — PROMETHAZINE HCL 25 MG/ML IJ SOLN: 6.2500 mg | INTRAMUSCULAR | Status: DC | PRN

## 2017-02-08 MED ORDER — 0.9 % SODIUM CHLORIDE (POUR BTL) OPTIME
TOPICAL | Status: DC | PRN
  Administered 2017-02-08: 1000 mL

## 2017-02-08 MED ORDER — FENTANYL CITRATE (PF) 100 MCG/2ML IJ SOLN: 25.0000 ug | INTRAMUSCULAR | Status: DC | PRN

## 2017-02-08 MED ORDER — MIDAZOLAM HCL 2 MG/2ML IJ SOLN: 0.5000 mg | Freq: Once | INTRAMUSCULAR | Status: DC | PRN

## 2017-02-08 MED ORDER — SODIUM CHLORIDE 0.9 % IV SOLN
INTRAVENOUS | Status: DC | PRN
  Administered 2017-02-08: 10:00:00

## 2017-02-08 MED ORDER — LIDOCAINE HCL 1 % IJ SOLN
INTRAMUSCULAR | Status: AC
  Filled 2017-02-08: qty 40

## 2017-02-08 MED ORDER — CEFUROXIME SODIUM 1.5 G IV SOLR
INTRAVENOUS | Status: AC
  Filled 2017-02-08: qty 1.5

## 2017-02-08 MED ORDER — ONDANSETRON HCL 4 MG/2ML IJ SOLN
INTRAMUSCULAR | Status: DC | PRN
  Administered 2017-02-08: 4 mg via INTRAVENOUS

## 2017-02-08 MED ORDER — ONDANSETRON HCL 4 MG/2ML IJ SOLN
INTRAMUSCULAR | Status: AC
  Filled 2017-02-08: qty 2

## 2017-02-08 MED ORDER — FENTANYL CITRATE (PF) 250 MCG/5ML IJ SOLN
INTRAMUSCULAR | Status: AC
  Filled 2017-02-08: qty 5

## 2017-02-08 MED ORDER — MIDAZOLAM HCL 5 MG/5ML IJ SOLN
INTRAMUSCULAR | Status: DC | PRN
  Administered 2017-02-08: 2 mg via INTRAVENOUS

## 2017-02-08 MED ORDER — SODIUM CHLORIDE 0.9 % IV SOLN: INTRAVENOUS | Status: DC

## 2017-02-08 MED ORDER — MEPERIDINE HCL 25 MG/ML IJ SOLN: 6.2500 mg | INTRAMUSCULAR | Status: DC | PRN

## 2017-02-08 MED ORDER — OXYCODONE-ACETAMINOPHEN 5-325 MG PO TABS: 1.0000 | ORAL_TABLET | Freq: Four times a day (QID) | ORAL | 0 refills | Status: DC | PRN

## 2017-02-08 MED ORDER — VANCOMYCIN HCL IN DEXTROSE 1-5 GM/200ML-% IV SOLN
INTRAVENOUS | Status: AC
  Filled 2017-02-08: qty 200

## 2017-02-08 MED ORDER — HEPARIN SOD (PORK) LOCK FLUSH 10 UNIT/ML IV SOLN: 20.0000 [IU] | INTRAVENOUS | Status: DC | PRN

## 2017-02-08 MED ORDER — LIDOCAINE-EPINEPHRINE (PF) 1 %-1:200000 IJ SOLN
INTRAMUSCULAR | Status: DC | PRN
  Administered 2017-02-08: 20 mL

## 2017-02-08 MED ORDER — FENTANYL CITRATE (PF) 100 MCG/2ML IJ SOLN
INTRAMUSCULAR | Status: DC | PRN
  Administered 2017-02-08: 50 ug via INTRAVENOUS

## 2017-02-08 MED ORDER — VANCOMYCIN HCL 1000 MG IV SOLR
1000.0000 mg | Freq: Two times a day (BID) | INTRAVENOUS | Status: DC
  Administered 2017-02-08: 1000 mg via INTRAVENOUS
  Filled 2017-02-08: qty 1000

## 2017-02-08 MED ORDER — MIDAZOLAM HCL 2 MG/2ML IJ SOLN
INTRAMUSCULAR | Status: AC
  Filled 2017-02-08: qty 2

## 2017-02-08 MED ORDER — PROPOFOL 500 MG/50ML IV EMUL
INTRAVENOUS | Status: DC | PRN
  Administered 2017-02-08: 75 ug/kg/min via INTRAVENOUS

## 2017-02-08 SURGICAL SUPPLY — 30 items
ARMBAND PINK RESTRICT EXTREMIT (MISCELLANEOUS) ×6 IMPLANT
CANISTER SUCT 3000ML PPV (MISCELLANEOUS) ×3 IMPLANT
CANNULA VESSEL 3MM 2 BLNT TIP (CANNULA) ×3 IMPLANT
CLIP LIGATING EXTRA MED SLVR (CLIP) ×3 IMPLANT
CLIP LIGATING EXTRA SM BLUE (MISCELLANEOUS) ×3 IMPLANT
COVER PROBE W GEL 5X96 (DRAPES) ×3 IMPLANT
DECANTER SPIKE VIAL GLASS SM (MISCELLANEOUS) ×3 IMPLANT
DERMABOND ADVANCED (GAUZE/BANDAGES/DRESSINGS) ×2
DERMABOND ADVANCED .7 DNX12 (GAUZE/BANDAGES/DRESSINGS) ×1 IMPLANT
ELECT REM PT RETURN 9FT ADLT (ELECTROSURGICAL) ×3
ELECTRODE REM PT RTRN 9FT ADLT (ELECTROSURGICAL) ×1 IMPLANT
GAUZE SPONGE 4X4 12PLY STRL (GAUZE/BANDAGES/DRESSINGS) ×3 IMPLANT
GLOVE BIO SURGEON STRL SZ 6.5 (GLOVE) ×6 IMPLANT
GLOVE BIO SURGEONS STRL SZ 6.5 (GLOVE) ×3
GLOVE BIOGEL PI IND STRL 7.0 (GLOVE) ×1 IMPLANT
GLOVE BIOGEL PI INDICATOR 7.0 (GLOVE) ×2
GLOVE SS BIOGEL STRL SZ 7.5 (GLOVE) ×1 IMPLANT
GLOVE SUPERSENSE BIOGEL SZ 7.5 (GLOVE) ×2
GOWN STRL REUS W/ TWL LRG LVL3 (GOWN DISPOSABLE) ×3 IMPLANT
GOWN STRL REUS W/TWL LRG LVL3 (GOWN DISPOSABLE) ×6
KIT BASIN OR (CUSTOM PROCEDURE TRAY) ×3 IMPLANT
KIT ROOM TURNOVER OR (KITS) ×3 IMPLANT
NS IRRIG 1000ML POUR BTL (IV SOLUTION) ×3 IMPLANT
PACK CV ACCESS (CUSTOM PROCEDURE TRAY) ×3 IMPLANT
PAD ARMBOARD 7.5X6 YLW CONV (MISCELLANEOUS) ×6 IMPLANT
SUT PROLENE 6 0 CC (SUTURE) ×6 IMPLANT
SUT VIC AB 3-0 SH 27 (SUTURE) ×2
SUT VIC AB 3-0 SH 27X BRD (SUTURE) ×1 IMPLANT
UNDERPAD 30X30 (UNDERPADS AND DIAPERS) ×3 IMPLANT
WATER STERILE IRR 1000ML POUR (IV SOLUTION) ×3 IMPLANT

## 2017-02-08 NOTE — H&P (Signed)
Office Visit   12/15/2016 [facility] -Judge Stall, Arvilla Meres, MD  Vascular Surgery   ESRD on dialysis [facility])  Dx   Chronic Renal Failure  Reason for Visit   Additional Documentation   Vitals:   BP  189/107   Pulse 98   Temp 97.5 F (36.4 C)   Resp 16   Ht 5\' 6"  (1.676 m)   Wt 216 lb (98 kg)   SpO2 100%   BMI 34.86 kg/m   BSA 2.14 m   Flowsheets:   Custom Formula Data,   Vital Signs,   MEWS Score,   Anthropometrics,   Clinical Intake     Encounter Info:   Billing Info,   History,   Allergies,   Detailed Report     All Notes   Progress Notes by Rosetta Posner, MD at 12/15/2016 12:15 PM   Author: Rosetta Posner, MD Author Type: Physician Filed: 12/15/2016 1:07 PM  Note Status: Signed Cosign: Cosign Not Required Encounter Date: 12/15/2016  Editor: Rosetta Posner, MD (Physician)                                          [facility]  Patient name: Karen [name]           MRN: [id number]        DOB: 10/05/1975          Sex: female  REASON FOR VISIT: Discuss options for hemodialysis  HPI: Karen [name] is a 41 y.o. female here today for discussion of hemodialysis options. She has a right IJ hemodialysis catheter which is been present for nearly a year. She was seen by Dr. Trula Slade in May 2017. At that time she appeared to have a borderline right basilic vein and no other veins acceptable for fistula in either arm. She had been scheduled for first stage basilic vein fistula and this was canceled on multiple occasions due to extreme hypertension or I blood sugars. She is here today for rescheduling and rediscussion. She recently underwent repeat duplex in      Past Medical History:  Diagnosis Date  . Anemia   . Anxiety   . Exertional shortness of breath    "recently; it's fluid" (02/02/2013)  . GERD (gastroesophageal reflux disease)   . History of blood transfusion    "last week" (02/02/2013)  . Hypertension    . Ischemic cardiomyopathy    by echo 2014  . Osteomyelitis of toe of left foot ([place])    "off and on since 2009; no OR" (02/02/2013)  . Renal failure    acute vs chronic/notes 02/02/2013  . Type II diabetes mellitus ([place]) 1995         Family History  Problem Relation Age of Onset  . Hypertension Mother   . Diabetes Mother   . Diabetes Father     SOCIAL HISTORY:      Social History  Substance Use Topics  . Smoking status: Former Smoker    Packs/day: 3.00    Years: 10.00    Types: Cigarettes    Quit date: 09/21/2004  . Smokeless tobacco: Never Used  . Alcohol use No         Allergies  Allergen Reactions  . Morphine And Related Hives and Rash  . Novolog [Insulin Aspart] Other (See Comments)  Cramps/ Gi distress  . Penicillins Hives and Rash    Has patient had a PCN reaction causing immediate rash, facial/tongue/throat swelling, SOB or lightheadedness with hypotension: No Has patient had a PCN reaction causing severe rash involving mucus membranes or skin necrosis: No Has patient had a PCN reaction that required hospitalization No Has patient had a PCN reaction occurring within the last 10 years: No If all of the above answers are "NO", then may proceed with Cephalosporin use. 09/10/16- tolerated Cefepime           Current Outpatient Prescriptions  Medication Sig Dispense Refill  . amLODipine (NORVASC) 5 MG tablet Take 1 tablet (5 mg total) by mouth daily. 30 tablet 2  . carvedilol (COREG) 25 MG tablet Take 25 mg by mouth 2 (two) times daily with a meal.     . insulin glargine (LANTUS) 100 UNIT/ML injection Inject 18 Units into the skin at bedtime.    Marland Kitchen oxyCODONE-acetaminophen (PERCOCET) 10-325 MG tablet Take 1 tablet by mouth 3 (three) times daily as needed for pain.      No current facility-administered medications for this visit.     REVIEW OF SYSTEMS:  [X]  denotes positive finding, [ ]  denotes negative finding Cardiac   Comments:  Chest pain or chest pressure:    Shortness of breath upon exertion:    Short of breath when lying flat:    Irregular heart rhythm:        Vascular    Pain in calf, thigh, or hip brought on by ambulation:    Pain in feet at night that wakes you up from your sleep:     Blood clot in your veins:    Leg swelling:           PHYSICAL EXAM:    Vitals:   12/15/16 1235  BP: (!) 189/107  Pulse: 98  Resp: 16  Temp: 97.5 F (36.4 C)  SpO2: 100%  Weight: 216 lb (98 kg)  Height: 5\' 6"  (1.676 m)    GENERAL: The patient is a well-nourished female, in no acute distress. The vital signs are documented above. 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 SKIN: Diffuse rash on both upper extremities PSYCHIATRIC: The patient has a normal affect.  DATA:  Repeat vein map shows 3 mm basilic vein. This is on the right side. I looked at this with SonoSite ultrasound this does appear to be very small and I do not even see a 3 mm vein.  MEDICAL ISSUES: Discussed options with patient. She is left-handed. I would recommend exploration of her right arm. Explained that time we would reimage with SonoSite and if the basilic vein appears to be acceptable for attempted would do a first stage basilic vein fistula. If not we would place a prosthetic graft. I did explain the ongoing issues regarding thromboses of the grafts and fistulas. Also discussed possible non-maturation of the fistula. She understands and wished to proceed as soon as possible    Rosetta Posner, MD FACS [facility] Tel [phone] Pager [phone]     Instructions   Addendum:  The patient has been re-examined and re-evaluated.  The patient's history and physical has  been reviewed and is unchanged.    Karen [name] is a 41 y.o. female is being admitted with End Stage  Renal Disease N18.6. All the risks, benefits and other treatment options have been discussed with the patient. The patient has consented to proceed with Procedure(s): RIGHT ARM ARTERIOVENOUS (AV) FISTULA CREATION VS INSERTION RIGHT ARM ARTERIOVENOUS GRAFT as a surgical intervention.  Curt Jews 02/08/2017 9:24 AM [facility]

## 2017-02-08 NOTE — Discharge Instructions (Signed)
° ° °  02/08/2017 Karen [name] [id number] [birth year]  Surgeon(s): Early, Arvilla Meres, MD  Procedure(s): CREATION OF RIGHT ARM  BASILIC VEIN TO BRACHIAL ARTERY ARTERIOVENOUS (AV) FISTULA  x Do not stick fistula for 12 weeks

## 2017-02-08 NOTE — Op Note (Signed)
    OPERATIVE REPORT  DATE OF SURGERY: 02/08/2017  PATIENT: Karen [name], 41 y.o. female MRN: [id number]  DOB: 06-13-1976  PRE-OPERATIVE DIAGNOSIS: End-stage renal disease  POST-OPERATIVE DIAGNOSIS:  Same  PROCEDURE: Right brachial to basilic AV fistula  SURGEON:  Curt Jews, M.D.  PHYSICIAN ASSISTANT: Samantha Rhyne PA-C  ANESTHESIA:  Local with sedation  EBL: Minimal ml  Total I/O In: -  Out: 5 [Blood:5]  BLOOD ADMINISTERED: None  DRAINS: None  SPECIMEN: None  COUNTS CORRECT:  YES  PLAN OF CARE: PACU   PATIENT DISPOSITION:  PACU - hemodynamically stable  PROCEDURE DETAILS: Patient was taken to the operating room placed supine position where the area of the right arm was prepped and draped in usual sterile fashion. SonoSite ultrasound was used to visualize the basilic vein which was of moderate size at the level of the elbow and proximally. Cephalic vein was nonvisualized. This was the same as was seen preoperatively. The course of the basilic vein was marked on the skin. Using local anesthesia incision was made on the medial aspect of the elbow over the basilic vein the vein was of moderate size. The vein was mobilized proximally and distally interpreter branches were ligated with 301 4-0 silk ties and divided. The brachial artery was exposed through a separate incision at the antecubital space. There was inflammation around the artery with some atherosclerotic changes well. The artery was of good caliber. The basilic vein was ligated distally and was tunneled to the level of the brachial artery. The artery was occluded proximally and distally and was opened with 11 blade and sent long-standing with Potts scissors. The vein was cut to appropriate length and was spatulated and sewn end-to-side to the artery with a running 6-0 Prolene suture. Clamps were removed and good thrill was noted. The wound irrigated with saline. Hemostasis tablet cautery. The wounds were closed with  3-0 Vicryl in the subcutaneous and subcuticular tissue. Sterile dressing was applied the patient was transferred to the recovery room in stable condition   Karen [name], M.D., [facility] 02/08/2017 11:31 AM

## 2017-02-08 NOTE — Progress Notes (Signed)
No preop orders in chart, call to OR room & spoke with circulator to ask Dr. Donnetta Hutching to place pre op orders in chart.

## 2017-02-08 NOTE — Anesthesia Preprocedure Evaluation (Addendum)
Anesthesia Evaluation  Patient identified by MRN, date of birth, ID band Patient awake    Reviewed: Allergy & Precautions, NPO status , Patient's Chart, lab work & pertinent test results  History of Anesthesia Complications Negative for: history of anesthetic complications  Airway Mallampati: II  TM Distance: >3 FB Neck ROM: Full    Dental  (+) Poor Dentition, Loose, Missing, Chipped, Dental Advisory Given   Pulmonary COPD, former smoker (quit '06),    breath sounds clear to auscultation       Cardiovascular hypertension (pt off of carvedilol presently), Pt. on medications + Peripheral Vascular Disease   Rhythm:Regular Rate:Normal  '14 ECHO: EF 40% to 45%. There is moderate hypokinesis of theinferoseptal myocardium, mod MR, mod TR   Neuro/Psych Anxiety negative neurological ROS     GI/Hepatic Neg liver ROS, GERD  Medicated and Controlled,  Endo/Other  diabetes (glu 139), Insulin DependentMorbid obesity  Renal/GU ESRF and DialysisRenal disease (TuThSa)     Musculoskeletal   Abdominal (+) + obese,   Peds  Hematology   Anesthesia Other Findings   Reproductive/Obstetrics                            Anesthesia Physical Anesthesia Plan  ASA: III  Anesthesia Plan: MAC   Post-op Pain Management:    Induction:   Airway Management Planned: Natural Airway and Simple Face Mask  Additional Equipment:   Intra-op Plan:   Post-operative Plan:   Informed Consent: I have reviewed the patients History and Physical, chart, labs and discussed the procedure including the risks, benefits and alternatives for the proposed anesthesia with the patient or authorized representative who has indicated his/her understanding and acceptance.   Dental advisory given  Plan Discussed with: CRNA and Surgeon  Anesthesia Plan Comments: (Plan routine monitors, MAC)        Anesthesia Quick Evaluation

## 2017-02-08 NOTE — Anesthesia Postprocedure Evaluation (Signed)
Anesthesia Post Note  Patient: Karen [name]  Procedure(s) Performed: Procedure(s) (LRB): CREATION OF RIGHT ARM  BASILIC VEIN TO BRACHIAL ARTERY ARTERIOVENOUS (AV) FISTULA (Right)  Patient location during evaluation: PACU Anesthesia Type: MAC Level of consciousness: awake and alert, patient cooperative and oriented Pain management: pain level controlled Vital Signs Assessment: post-procedure vital signs reviewed and stable Respiratory status: spontaneous breathing, nonlabored ventilation and respiratory function stable Cardiovascular status: blood pressure returned to baseline and stable Postop Assessment: no signs of nausea or vomiting Anesthetic complications: no       Last Vitals:  Vitals:   02/08/17 1210 02/08/17 1234  BP: (!) 161/100 (!) 168/98  Pulse: 85   Resp: 15 16  Temp: 36.9 C     Last Pain:  Vitals:   02/08/17 1234  TempSrc:   PainSc: 0-No pain                 Vanden Fawaz,E. Emmarae Cowdery

## 2017-02-08 NOTE — Anesthesia Procedure Notes (Signed)
Procedure Name: MAC Date/Time: 02/08/2017 10:15 AM Performed by: Jenne Campus Pre-anesthesia Checklist: Patient identified, Emergency Drugs available, Suction available, Patient being monitored and Timeout performed Oxygen Delivery Method: Simple face mask

## 2017-02-08 NOTE — Transfer of Care (Signed)
Immediate Anesthesia Transfer of Care Note  Patient: Karen [name]  Procedure(s) Performed: Procedure(s): CREATION OF RIGHT ARM  BASILIC VEIN TO BRACHIAL ARTERY ARTERIOVENOUS (AV) FISTULA (Right)  Patient Location: PACU  Anesthesia Type:MAC  Level of Consciousness: sedated and patient cooperative  Airway & Oxygen Therapy: Patient Spontanous Breathing and Patient connected to face mask oxygen  Post-op Assessment: Report given to RN and Post -op Vital signs reviewed and stable  Post vital signs: Reviewed  Last Vitals:  Vitals:   02/08/17 0921 02/08/17 1140  BP: (!) 183/64 (!) (P) 157/97  Pulse: 98   Resp: 18 (P) 19  Temp: 36.6 C (P) 36.8 C    Last Pain:  Vitals:   02/08/17 1140  TempSrc:   PainSc: (P) 0-No pain         Complications: No apparent anesthesia complications

## 2017-02-09 ENCOUNTER — Telehealth: Payer: Self-pay | Admitting: Vascular Surgery

## 2017-02-09 ENCOUNTER — Encounter (HOSPITAL_COMMUNITY): Payer: Self-pay | Admitting: Vascular Surgery

## 2017-02-09 DIAGNOSIS — D631 Anemia in chronic kidney disease: Secondary | ICD-10-CM | POA: Diagnosis not present

## 2017-02-09 DIAGNOSIS — N2581 Secondary hyperparathyroidism of renal origin: Secondary | ICD-10-CM | POA: Diagnosis not present

## 2017-02-09 DIAGNOSIS — E1129 Type 2 diabetes mellitus with other diabetic kidney complication: Secondary | ICD-10-CM | POA: Diagnosis not present

## 2017-02-09 DIAGNOSIS — N186 End stage renal disease: Secondary | ICD-10-CM | POA: Diagnosis not present

## 2017-02-09 DIAGNOSIS — D509 Iron deficiency anemia, unspecified: Secondary | ICD-10-CM | POA: Diagnosis not present

## 2017-02-09 NOTE — Telephone Encounter (Signed)
LVM on mobile # for appts 6/18 [place] and 6/19 OV  Mailed lttr

## 2017-02-09 NOTE — Telephone Encounter (Signed)
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  F/u with Dr. Donnetta Hutching (MD only) in 4-6 weeks with duplex to talk about 2nd stage BVT.  Thanks, Aldona Bar

## 2017-02-11 DIAGNOSIS — E1129 Type 2 diabetes mellitus with other diabetic kidney complication: Secondary | ICD-10-CM | POA: Diagnosis not present

## 2017-02-11 DIAGNOSIS — N186 End stage renal disease: Secondary | ICD-10-CM | POA: Diagnosis not present

## 2017-02-11 DIAGNOSIS — D631 Anemia in chronic kidney disease: Secondary | ICD-10-CM | POA: Diagnosis not present

## 2017-02-11 DIAGNOSIS — D509 Iron deficiency anemia, unspecified: Secondary | ICD-10-CM | POA: Diagnosis not present

## 2017-02-11 DIAGNOSIS — N2581 Secondary hyperparathyroidism of renal origin: Secondary | ICD-10-CM | POA: Diagnosis not present

## 2017-02-16 DIAGNOSIS — N2581 Secondary hyperparathyroidism of renal origin: Secondary | ICD-10-CM | POA: Diagnosis not present

## 2017-02-16 DIAGNOSIS — E1129 Type 2 diabetes mellitus with other diabetic kidney complication: Secondary | ICD-10-CM | POA: Diagnosis not present

## 2017-02-16 DIAGNOSIS — D631 Anemia in chronic kidney disease: Secondary | ICD-10-CM | POA: Diagnosis not present

## 2017-02-16 DIAGNOSIS — D509 Iron deficiency anemia, unspecified: Secondary | ICD-10-CM | POA: Diagnosis not present

## 2017-02-16 DIAGNOSIS — N186 End stage renal disease: Secondary | ICD-10-CM | POA: Diagnosis not present

## 2017-02-18 DIAGNOSIS — E1122 Type 2 diabetes mellitus with diabetic chronic kidney disease: Secondary | ICD-10-CM | POA: Diagnosis not present

## 2017-02-18 DIAGNOSIS — N186 End stage renal disease: Secondary | ICD-10-CM | POA: Diagnosis not present

## 2017-02-18 DIAGNOSIS — D509 Iron deficiency anemia, unspecified: Secondary | ICD-10-CM | POA: Diagnosis not present

## 2017-02-18 DIAGNOSIS — Z992 Dependence on renal dialysis: Secondary | ICD-10-CM | POA: Diagnosis not present

## 2017-02-18 DIAGNOSIS — E1129 Type 2 diabetes mellitus with other diabetic kidney complication: Secondary | ICD-10-CM | POA: Diagnosis not present

## 2017-02-18 DIAGNOSIS — N2581 Secondary hyperparathyroidism of renal origin: Secondary | ICD-10-CM | POA: Diagnosis not present

## 2017-02-18 DIAGNOSIS — D631 Anemia in chronic kidney disease: Secondary | ICD-10-CM | POA: Diagnosis not present

## 2017-02-20 DIAGNOSIS — D509 Iron deficiency anemia, unspecified: Secondary | ICD-10-CM | POA: Diagnosis not present

## 2017-02-20 DIAGNOSIS — N186 End stage renal disease: Secondary | ICD-10-CM | POA: Diagnosis not present

## 2017-02-20 DIAGNOSIS — E1129 Type 2 diabetes mellitus with other diabetic kidney complication: Secondary | ICD-10-CM | POA: Diagnosis not present

## 2017-02-20 DIAGNOSIS — D631 Anemia in chronic kidney disease: Secondary | ICD-10-CM | POA: Diagnosis not present

## 2017-02-20 DIAGNOSIS — N2581 Secondary hyperparathyroidism of renal origin: Secondary | ICD-10-CM | POA: Diagnosis not present

## 2017-02-23 DIAGNOSIS — N186 End stage renal disease: Secondary | ICD-10-CM | POA: Diagnosis not present

## 2017-02-23 DIAGNOSIS — D509 Iron deficiency anemia, unspecified: Secondary | ICD-10-CM | POA: Diagnosis not present

## 2017-02-23 DIAGNOSIS — E1129 Type 2 diabetes mellitus with other diabetic kidney complication: Secondary | ICD-10-CM | POA: Diagnosis not present

## 2017-02-23 DIAGNOSIS — D631 Anemia in chronic kidney disease: Secondary | ICD-10-CM | POA: Diagnosis not present

## 2017-02-23 DIAGNOSIS — N2581 Secondary hyperparathyroidism of renal origin: Secondary | ICD-10-CM | POA: Diagnosis not present

## 2017-02-25 DIAGNOSIS — E1129 Type 2 diabetes mellitus with other diabetic kidney complication: Secondary | ICD-10-CM | POA: Diagnosis not present

## 2017-02-25 DIAGNOSIS — N186 End stage renal disease: Secondary | ICD-10-CM | POA: Diagnosis not present

## 2017-02-25 DIAGNOSIS — D509 Iron deficiency anemia, unspecified: Secondary | ICD-10-CM | POA: Diagnosis not present

## 2017-02-25 DIAGNOSIS — N2581 Secondary hyperparathyroidism of renal origin: Secondary | ICD-10-CM | POA: Diagnosis not present

## 2017-02-25 DIAGNOSIS — D631 Anemia in chronic kidney disease: Secondary | ICD-10-CM | POA: Diagnosis not present

## 2017-02-27 DIAGNOSIS — N2581 Secondary hyperparathyroidism of renal origin: Secondary | ICD-10-CM | POA: Diagnosis not present

## 2017-02-27 DIAGNOSIS — E1129 Type 2 diabetes mellitus with other diabetic kidney complication: Secondary | ICD-10-CM | POA: Diagnosis not present

## 2017-02-27 DIAGNOSIS — N186 End stage renal disease: Secondary | ICD-10-CM | POA: Diagnosis not present

## 2017-02-27 DIAGNOSIS — D631 Anemia in chronic kidney disease: Secondary | ICD-10-CM | POA: Diagnosis not present

## 2017-02-27 DIAGNOSIS — D509 Iron deficiency anemia, unspecified: Secondary | ICD-10-CM | POA: Diagnosis not present

## 2017-03-01 ENCOUNTER — Encounter: Payer: Self-pay | Admitting: Vascular Surgery

## 2017-03-02 DIAGNOSIS — D631 Anemia in chronic kidney disease: Secondary | ICD-10-CM | POA: Diagnosis not present

## 2017-03-02 DIAGNOSIS — D509 Iron deficiency anemia, unspecified: Secondary | ICD-10-CM | POA: Diagnosis not present

## 2017-03-02 DIAGNOSIS — E1129 Type 2 diabetes mellitus with other diabetic kidney complication: Secondary | ICD-10-CM | POA: Diagnosis not present

## 2017-03-02 DIAGNOSIS — N2581 Secondary hyperparathyroidism of renal origin: Secondary | ICD-10-CM | POA: Diagnosis not present

## 2017-03-02 DIAGNOSIS — N186 End stage renal disease: Secondary | ICD-10-CM | POA: Diagnosis not present

## 2017-03-04 DIAGNOSIS — D631 Anemia in chronic kidney disease: Secondary | ICD-10-CM | POA: Diagnosis not present

## 2017-03-04 DIAGNOSIS — N186 End stage renal disease: Secondary | ICD-10-CM | POA: Diagnosis not present

## 2017-03-04 DIAGNOSIS — E1129 Type 2 diabetes mellitus with other diabetic kidney complication: Secondary | ICD-10-CM | POA: Diagnosis not present

## 2017-03-04 DIAGNOSIS — N2581 Secondary hyperparathyroidism of renal origin: Secondary | ICD-10-CM | POA: Diagnosis not present

## 2017-03-04 DIAGNOSIS — D509 Iron deficiency anemia, unspecified: Secondary | ICD-10-CM | POA: Diagnosis not present

## 2017-03-04 NOTE — Addendum Note (Signed)
Addended by: Lianne Cure A on: 03/04/2017 10:24 AM   Modules accepted: Orders

## 2017-03-06 DIAGNOSIS — N186 End stage renal disease: Secondary | ICD-10-CM | POA: Diagnosis not present

## 2017-03-06 DIAGNOSIS — D509 Iron deficiency anemia, unspecified: Secondary | ICD-10-CM | POA: Diagnosis not present

## 2017-03-06 DIAGNOSIS — N2581 Secondary hyperparathyroidism of renal origin: Secondary | ICD-10-CM | POA: Diagnosis not present

## 2017-03-06 DIAGNOSIS — D631 Anemia in chronic kidney disease: Secondary | ICD-10-CM | POA: Diagnosis not present

## 2017-03-06 DIAGNOSIS — E1129 Type 2 diabetes mellitus with other diabetic kidney complication: Secondary | ICD-10-CM | POA: Diagnosis not present

## 2017-03-08 ENCOUNTER — Ambulatory Visit (HOSPITAL_COMMUNITY): Admission: RE | Admit: 2017-03-08 | Payer: Medicare Other | Source: Ambulatory Visit

## 2017-03-09 ENCOUNTER — Encounter: Payer: Medicare Other | Admitting: Vascular Surgery

## 2017-03-09 DIAGNOSIS — H524 Presbyopia: Secondary | ICD-10-CM | POA: Diagnosis not present

## 2017-03-09 DIAGNOSIS — H25813 Combined forms of age-related cataract, bilateral: Secondary | ICD-10-CM | POA: Diagnosis not present

## 2017-03-09 DIAGNOSIS — E113513 Type 2 diabetes mellitus with proliferative diabetic retinopathy with macular edema, bilateral: Secondary | ICD-10-CM | POA: Diagnosis not present

## 2017-03-10 DIAGNOSIS — N186 End stage renal disease: Secondary | ICD-10-CM | POA: Diagnosis not present

## 2017-03-10 DIAGNOSIS — D631 Anemia in chronic kidney disease: Secondary | ICD-10-CM | POA: Diagnosis not present

## 2017-03-10 DIAGNOSIS — D509 Iron deficiency anemia, unspecified: Secondary | ICD-10-CM | POA: Diagnosis not present

## 2017-03-10 DIAGNOSIS — E1129 Type 2 diabetes mellitus with other diabetic kidney complication: Secondary | ICD-10-CM | POA: Diagnosis not present

## 2017-03-10 DIAGNOSIS — N2581 Secondary hyperparathyroidism of renal origin: Secondary | ICD-10-CM | POA: Diagnosis not present

## 2017-03-11 DIAGNOSIS — D509 Iron deficiency anemia, unspecified: Secondary | ICD-10-CM | POA: Diagnosis not present

## 2017-03-11 DIAGNOSIS — N2581 Secondary hyperparathyroidism of renal origin: Secondary | ICD-10-CM | POA: Diagnosis not present

## 2017-03-11 DIAGNOSIS — E1129 Type 2 diabetes mellitus with other diabetic kidney complication: Secondary | ICD-10-CM | POA: Diagnosis not present

## 2017-03-11 DIAGNOSIS — N186 End stage renal disease: Secondary | ICD-10-CM | POA: Diagnosis not present

## 2017-03-11 DIAGNOSIS — D631 Anemia in chronic kidney disease: Secondary | ICD-10-CM | POA: Diagnosis not present

## 2017-03-15 DIAGNOSIS — D631 Anemia in chronic kidney disease: Secondary | ICD-10-CM | POA: Diagnosis not present

## 2017-03-15 DIAGNOSIS — E1129 Type 2 diabetes mellitus with other diabetic kidney complication: Secondary | ICD-10-CM | POA: Diagnosis not present

## 2017-03-15 DIAGNOSIS — N2581 Secondary hyperparathyroidism of renal origin: Secondary | ICD-10-CM | POA: Diagnosis not present

## 2017-03-15 DIAGNOSIS — N186 End stage renal disease: Secondary | ICD-10-CM | POA: Diagnosis not present

## 2017-03-15 DIAGNOSIS — D509 Iron deficiency anemia, unspecified: Secondary | ICD-10-CM | POA: Diagnosis not present

## 2017-03-16 DIAGNOSIS — D509 Iron deficiency anemia, unspecified: Secondary | ICD-10-CM | POA: Diagnosis not present

## 2017-03-16 DIAGNOSIS — E1129 Type 2 diabetes mellitus with other diabetic kidney complication: Secondary | ICD-10-CM | POA: Diagnosis not present

## 2017-03-16 DIAGNOSIS — D631 Anemia in chronic kidney disease: Secondary | ICD-10-CM | POA: Diagnosis not present

## 2017-03-16 DIAGNOSIS — N186 End stage renal disease: Secondary | ICD-10-CM | POA: Diagnosis not present

## 2017-03-16 DIAGNOSIS — N2581 Secondary hyperparathyroidism of renal origin: Secondary | ICD-10-CM | POA: Diagnosis not present

## 2017-03-18 DIAGNOSIS — N186 End stage renal disease: Secondary | ICD-10-CM | POA: Diagnosis not present

## 2017-03-18 DIAGNOSIS — E1129 Type 2 diabetes mellitus with other diabetic kidney complication: Secondary | ICD-10-CM | POA: Diagnosis not present

## 2017-03-18 DIAGNOSIS — D631 Anemia in chronic kidney disease: Secondary | ICD-10-CM | POA: Diagnosis not present

## 2017-03-18 DIAGNOSIS — N2581 Secondary hyperparathyroidism of renal origin: Secondary | ICD-10-CM | POA: Diagnosis not present

## 2017-03-18 DIAGNOSIS — D509 Iron deficiency anemia, unspecified: Secondary | ICD-10-CM | POA: Diagnosis not present

## 2017-03-20 DIAGNOSIS — Z992 Dependence on renal dialysis: Secondary | ICD-10-CM | POA: Diagnosis not present

## 2017-03-20 DIAGNOSIS — N186 End stage renal disease: Secondary | ICD-10-CM | POA: Diagnosis not present

## 2017-03-20 DIAGNOSIS — E1129 Type 2 diabetes mellitus with other diabetic kidney complication: Secondary | ICD-10-CM | POA: Diagnosis not present

## 2017-03-20 DIAGNOSIS — E1122 Type 2 diabetes mellitus with diabetic chronic kidney disease: Secondary | ICD-10-CM | POA: Diagnosis not present

## 2017-03-20 DIAGNOSIS — D631 Anemia in chronic kidney disease: Secondary | ICD-10-CM | POA: Diagnosis not present

## 2017-03-20 DIAGNOSIS — D509 Iron deficiency anemia, unspecified: Secondary | ICD-10-CM | POA: Diagnosis not present

## 2017-03-20 DIAGNOSIS — N2581 Secondary hyperparathyroidism of renal origin: Secondary | ICD-10-CM | POA: Diagnosis not present

## 2017-03-23 DIAGNOSIS — N2581 Secondary hyperparathyroidism of renal origin: Secondary | ICD-10-CM | POA: Diagnosis not present

## 2017-03-23 DIAGNOSIS — D509 Iron deficiency anemia, unspecified: Secondary | ICD-10-CM | POA: Diagnosis not present

## 2017-03-23 DIAGNOSIS — E1129 Type 2 diabetes mellitus with other diabetic kidney complication: Secondary | ICD-10-CM | POA: Diagnosis not present

## 2017-03-23 DIAGNOSIS — N186 End stage renal disease: Secondary | ICD-10-CM | POA: Diagnosis not present

## 2017-03-23 DIAGNOSIS — D631 Anemia in chronic kidney disease: Secondary | ICD-10-CM | POA: Diagnosis not present

## 2017-03-25 DIAGNOSIS — E113511 Type 2 diabetes mellitus with proliferative diabetic retinopathy with macular edema, right eye: Secondary | ICD-10-CM | POA: Diagnosis not present

## 2017-03-27 DIAGNOSIS — D631 Anemia in chronic kidney disease: Secondary | ICD-10-CM | POA: Diagnosis not present

## 2017-03-27 DIAGNOSIS — N2581 Secondary hyperparathyroidism of renal origin: Secondary | ICD-10-CM | POA: Diagnosis not present

## 2017-03-27 DIAGNOSIS — E1129 Type 2 diabetes mellitus with other diabetic kidney complication: Secondary | ICD-10-CM | POA: Diagnosis not present

## 2017-03-27 DIAGNOSIS — D509 Iron deficiency anemia, unspecified: Secondary | ICD-10-CM | POA: Diagnosis not present

## 2017-03-27 DIAGNOSIS — N186 End stage renal disease: Secondary | ICD-10-CM | POA: Diagnosis not present

## 2017-03-30 DIAGNOSIS — D631 Anemia in chronic kidney disease: Secondary | ICD-10-CM | POA: Diagnosis not present

## 2017-03-30 DIAGNOSIS — N186 End stage renal disease: Secondary | ICD-10-CM | POA: Diagnosis not present

## 2017-03-30 DIAGNOSIS — E1129 Type 2 diabetes mellitus with other diabetic kidney complication: Secondary | ICD-10-CM | POA: Diagnosis not present

## 2017-03-30 DIAGNOSIS — N2581 Secondary hyperparathyroidism of renal origin: Secondary | ICD-10-CM | POA: Diagnosis not present

## 2017-03-30 DIAGNOSIS — D509 Iron deficiency anemia, unspecified: Secondary | ICD-10-CM | POA: Diagnosis not present

## 2017-03-31 ENCOUNTER — Encounter: Payer: Self-pay | Admitting: Vascular Surgery

## 2017-04-01 DIAGNOSIS — E113511 Type 2 diabetes mellitus with proliferative diabetic retinopathy with macular edema, right eye: Secondary | ICD-10-CM | POA: Diagnosis not present

## 2017-04-02 DIAGNOSIS — N2581 Secondary hyperparathyroidism of renal origin: Secondary | ICD-10-CM | POA: Diagnosis not present

## 2017-04-02 DIAGNOSIS — E1129 Type 2 diabetes mellitus with other diabetic kidney complication: Secondary | ICD-10-CM | POA: Diagnosis not present

## 2017-04-02 DIAGNOSIS — N186 End stage renal disease: Secondary | ICD-10-CM | POA: Diagnosis not present

## 2017-04-02 DIAGNOSIS — D509 Iron deficiency anemia, unspecified: Secondary | ICD-10-CM | POA: Diagnosis not present

## 2017-04-02 DIAGNOSIS — D631 Anemia in chronic kidney disease: Secondary | ICD-10-CM | POA: Diagnosis not present

## 2017-04-06 DIAGNOSIS — D509 Iron deficiency anemia, unspecified: Secondary | ICD-10-CM | POA: Diagnosis not present

## 2017-04-06 DIAGNOSIS — D631 Anemia in chronic kidney disease: Secondary | ICD-10-CM | POA: Diagnosis not present

## 2017-04-06 DIAGNOSIS — N2581 Secondary hyperparathyroidism of renal origin: Secondary | ICD-10-CM | POA: Diagnosis not present

## 2017-04-06 DIAGNOSIS — E1129 Type 2 diabetes mellitus with other diabetic kidney complication: Secondary | ICD-10-CM | POA: Diagnosis not present

## 2017-04-06 DIAGNOSIS — N186 End stage renal disease: Secondary | ICD-10-CM | POA: Diagnosis not present

## 2017-04-08 DIAGNOSIS — D509 Iron deficiency anemia, unspecified: Secondary | ICD-10-CM | POA: Diagnosis not present

## 2017-04-08 DIAGNOSIS — E1129 Type 2 diabetes mellitus with other diabetic kidney complication: Secondary | ICD-10-CM | POA: Diagnosis not present

## 2017-04-08 DIAGNOSIS — N2581 Secondary hyperparathyroidism of renal origin: Secondary | ICD-10-CM | POA: Diagnosis not present

## 2017-04-08 DIAGNOSIS — N186 End stage renal disease: Secondary | ICD-10-CM | POA: Diagnosis not present

## 2017-04-08 DIAGNOSIS — D631 Anemia in chronic kidney disease: Secondary | ICD-10-CM | POA: Diagnosis not present

## 2017-04-10 DIAGNOSIS — N2581 Secondary hyperparathyroidism of renal origin: Secondary | ICD-10-CM | POA: Diagnosis not present

## 2017-04-10 DIAGNOSIS — D509 Iron deficiency anemia, unspecified: Secondary | ICD-10-CM | POA: Diagnosis not present

## 2017-04-10 DIAGNOSIS — N186 End stage renal disease: Secondary | ICD-10-CM | POA: Diagnosis not present

## 2017-04-10 DIAGNOSIS — E1129 Type 2 diabetes mellitus with other diabetic kidney complication: Secondary | ICD-10-CM | POA: Diagnosis not present

## 2017-04-10 DIAGNOSIS — D631 Anemia in chronic kidney disease: Secondary | ICD-10-CM | POA: Diagnosis not present

## 2017-04-12 DIAGNOSIS — N186 End stage renal disease: Secondary | ICD-10-CM | POA: Diagnosis not present

## 2017-04-12 DIAGNOSIS — D631 Anemia in chronic kidney disease: Secondary | ICD-10-CM | POA: Diagnosis not present

## 2017-04-12 DIAGNOSIS — D509 Iron deficiency anemia, unspecified: Secondary | ICD-10-CM | POA: Diagnosis not present

## 2017-04-12 DIAGNOSIS — N2581 Secondary hyperparathyroidism of renal origin: Secondary | ICD-10-CM | POA: Diagnosis not present

## 2017-04-12 DIAGNOSIS — E1129 Type 2 diabetes mellitus with other diabetic kidney complication: Secondary | ICD-10-CM | POA: Diagnosis not present

## 2017-04-13 ENCOUNTER — Encounter (HOSPITAL_COMMUNITY): Payer: Self-pay | Admitting: *Deleted

## 2017-04-13 ENCOUNTER — Ambulatory Visit (INDEPENDENT_AMBULATORY_CARE_PROVIDER_SITE_OTHER): Payer: Self-pay | Admitting: Vascular Surgery

## 2017-04-13 ENCOUNTER — Ambulatory Visit (HOSPITAL_COMMUNITY)
Admission: RE | Admit: 2017-04-13 | Discharge: 2017-04-13 | Disposition: A | Discharge status: Home / Self Care | Payer: Medicare Other | Source: Ambulatory Visit | Attending: Vascular Surgery | Admitting: Vascular Surgery

## 2017-04-13 ENCOUNTER — Encounter: Payer: Self-pay | Admitting: Vascular Surgery

## 2017-04-13 ENCOUNTER — Other Ambulatory Visit: Payer: Self-pay

## 2017-04-13 VITALS — BP 152/94 | HR 85 | Temp 97.0°F | Resp 16 | Ht 66.0 in | Wt 209.0 lb

## 2017-04-13 DIAGNOSIS — N186 End stage renal disease: Secondary | ICD-10-CM | POA: Diagnosis not present

## 2017-04-13 DIAGNOSIS — Z992 Dependence on renal dialysis: Secondary | ICD-10-CM

## 2017-04-13 NOTE — Progress Notes (Signed)
   Patient name: Karen [name] MRN: [id number] DOB: September 24, 1975 Sex: female  REASON FOR VISIT: Follow-up for stage right brachial basilic fistula  HPI: Karen [name] is a 41 y.o. female . Today for follow-up of her right brachiobasilic fistula creation by myself on 02/08/2017. She continues to have hemodialysis via her tunneled catheter.  Current Outpatient Prescriptions  Medication Sig Dispense Refill  . amLODipine (NORVASC) 5 MG tablet Take 5 mg by mouth daily.    . calcium acetate (PHOSLO) 667 MG capsule Take 1,334 mg by mouth 3 (three) times daily with meals.    . insulin glargine (LANTUS) 100 UNIT/ML injection Inject 18 Units into the skin at bedtime.    . insulin lispro (HUMALOG) 100 UNIT/ML injection Inject 2-5 Units into the skin 3 (three) times daily as needed for high blood sugar. Per sliding scale if blood sugar is over 150    . oxyCODONE-acetaminophen (ROXICET) 5-325 MG tablet Take 1 tablet by mouth every 6 (six) hours as needed for severe pain. 8 tablet 0  . pantoprazole (PROTONIX) 40 MG tablet Take 40 mg by mouth daily as needed (for heartburn).     . triamcinolone cream (KENALOG) 0.1 % Apply 1 application topically 2 (two) times daily.     No current facility-administered medications for this visit.    Facility-Administered Medications Ordered in Other Visits  Medication Dose Route Frequency Provider Last Rate Last Dose  . 0.9 %  sodium chloride infusion   Intravenous Continuous Braxtyn Bojarski, Arvilla Meres, MD      . 0.9 %  sodium chloride infusion   Intravenous Continuous Tylik Treese, Arvilla Meres, MD         PHYSICAL EXAM: There were no vitals filed for this visit.  GENERAL: The patient is a well-nourished female, in no acute distress. The vital signs are documented above. Well-healed antecubital incisions. Some hypertrophic scar. Excellent thrill.  Duplex today shows vein size in the 6-7 mm range throughout the majority of her arm  MEDICAL ISSUES: Good Janaiya Beauchesne  maturation of right brachiobasilic vein anastomosis fistula. Will schedule second stage transposition as an outpatient tomorrow. Patient has had a great deal difficulty in the past with presentation to the hospital with extreme hypertension or an extremely elevated blood sugars. She reports that this is been relatively good control recently for her today. Systolic blood pressure today is 150 in our office.   Rosetta Posner, MD FACS [facility] Tel [phone] Pager [phone]

## 2017-04-13 NOTE — Progress Notes (Signed)
Spoke with pt for pre-op call. Pt denies cardiac history, chest pain or sob. Pt is a type 2 diabetic. Last A1C ws 8.1 on 10/06/16. Pt states her fasting blood sugar is usually around 130's. Instructed pt to take 1/2 of her regular dose of Lantus Insulin this evening (pt will take 9 units). Instructed pt to check her blood sugar in the AM when she wakes up and every 2 hours until she leaves for the hospital. If blood sugar is >220 take 1/2 of usual correction dose of Humalog insulin. If blood sugar is 70 or below, treat with 1/2 cup of clear juice (apple or cranberry) and recheck blood sugar 15 minutes after drinking juice. If blood sugar continues to be 70 or below, call the Short Stay department and ask to speak to a nurse. Pt voiced understanding.

## 2017-04-13 NOTE — Progress Notes (Signed)
Vitals:   04/13/17 0852  BP: (!) 167/96  Pulse: 85  Resp: 16  Temp: (!) 97 F (36.1 C)  SpO2: 98%  Weight: 209 lb (94.8 kg)  Height: 5\' 6"  (1.676 m)

## 2017-04-14 ENCOUNTER — Encounter (HOSPITAL_COMMUNITY)
Admission: RE | Disposition: A | Discharge status: Home / Self Care | Payer: Self-pay | Source: Ambulatory Visit | Attending: Vascular Surgery

## 2017-04-14 ENCOUNTER — Telehealth: Payer: Self-pay | Admitting: Vascular Surgery

## 2017-04-14 ENCOUNTER — Ambulatory Visit (HOSPITAL_COMMUNITY)
Admission: RE | Admit: 2017-04-14 | Discharge: 2017-04-14 | Disposition: A | Discharge status: Home / Self Care | Payer: Medicare Other | Source: Ambulatory Visit | Attending: Vascular Surgery | Admitting: Vascular Surgery

## 2017-04-14 ENCOUNTER — Ambulatory Visit (HOSPITAL_COMMUNITY): Payer: Medicare Other | Admitting: Registered Nurse

## 2017-04-14 ENCOUNTER — Encounter (HOSPITAL_COMMUNITY): Payer: Self-pay | Admitting: Certified Registered Nurse Anesthetist

## 2017-04-14 DIAGNOSIS — Z992 Dependence on renal dialysis: Secondary | ICD-10-CM | POA: Insufficient documentation

## 2017-04-14 DIAGNOSIS — Z79899 Other long term (current) drug therapy: Secondary | ICD-10-CM | POA: Insufficient documentation

## 2017-04-14 DIAGNOSIS — N186 End stage renal disease: Secondary | ICD-10-CM | POA: Diagnosis not present

## 2017-04-14 DIAGNOSIS — E669 Obesity, unspecified: Secondary | ICD-10-CM | POA: Insufficient documentation

## 2017-04-14 DIAGNOSIS — I120 Hypertensive chronic kidney disease with stage 5 chronic kidney disease or end stage renal disease: Secondary | ICD-10-CM | POA: Insufficient documentation

## 2017-04-14 DIAGNOSIS — K219 Gastro-esophageal reflux disease without esophagitis: Secondary | ICD-10-CM | POA: Diagnosis not present

## 2017-04-14 DIAGNOSIS — Z6833 Body mass index (BMI) 33.0-33.9, adult: Secondary | ICD-10-CM | POA: Diagnosis not present

## 2017-04-14 DIAGNOSIS — Z794 Long term (current) use of insulin: Secondary | ICD-10-CM | POA: Diagnosis not present

## 2017-04-14 DIAGNOSIS — E1122 Type 2 diabetes mellitus with diabetic chronic kidney disease: Secondary | ICD-10-CM | POA: Insufficient documentation

## 2017-04-14 DIAGNOSIS — N185 Chronic kidney disease, stage 5: Secondary | ICD-10-CM | POA: Diagnosis not present

## 2017-04-14 HISTORY — DX: Headache, unspecified: R51.9

## 2017-04-14 HISTORY — PX: BASCILIC VEIN TRANSPOSITION: SHX5742

## 2017-04-14 HISTORY — DX: Headache: R51

## 2017-04-14 LAB — GLUCOSE, CAPILLARY
GLUCOSE-CAPILLARY: 69 mg/dL (ref 65–99)
Glucose-Capillary: 75 mg/dL (ref 65–99)
Glucose-Capillary: 87 mg/dL (ref 65–99)
Glucose-Capillary: 99 mg/dL (ref 65–99)

## 2017-04-14 LAB — HCG, SERUM, QUALITATIVE: PREG SERUM: NEGATIVE

## 2017-04-14 LAB — POCT I-STAT 4, (NA,K, GLUC, HGB,HCT)
Glucose, Bld: 60 mg/dL — ABNORMAL LOW (ref 65–99)
HCT: 35 % — ABNORMAL LOW (ref 36.0–46.0)
HEMOGLOBIN: 11.9 g/dL — AB (ref 12.0–15.0)
POTASSIUM: 4.4 mmol/L (ref 3.5–5.1)
SODIUM: 138 mmol/L (ref 135–145)

## 2017-04-14 SURGERY — TRANSPOSITION, VEIN, BASILIC
Anesthesia: General | Site: Arm Upper | Laterality: Right

## 2017-04-14 MED ORDER — LABETALOL HCL 5 MG/ML IV SOLN
INTRAVENOUS | Status: AC
  Administered 2017-04-14: 5 mg via INTRAVENOUS
  Filled 2017-04-14: qty 4

## 2017-04-14 MED ORDER — FENTANYL CITRATE (PF) 100 MCG/2ML IJ SOLN
25.0000 ug | INTRAMUSCULAR | Status: DC | PRN
  Administered 2017-04-14: 50 ug via INTRAVENOUS

## 2017-04-14 MED ORDER — MIDAZOLAM HCL 2 MG/2ML IJ SOLN
INTRAMUSCULAR | Status: DC | PRN
  Administered 2017-04-14 (×2): 1 mg via INTRAVENOUS

## 2017-04-14 MED ORDER — KETAMINE HCL-SODIUM CHLORIDE 100-0.9 MG/10ML-% IV SOSY
PREFILLED_SYRINGE | INTRAVENOUS | Status: AC
  Filled 2017-04-14: qty 10

## 2017-04-14 MED ORDER — SODIUM CHLORIDE 0.9 % IV SOLN: INTRAVENOUS | Status: DC

## 2017-04-14 MED ORDER — ALBUTEROL SULFATE HFA 108 (90 BASE) MCG/ACT IN AERS
INHALATION_SPRAY | RESPIRATORY_TRACT | Status: AC
  Filled 2017-04-14: qty 6.7

## 2017-04-14 MED ORDER — LIDOCAINE-EPINEPHRINE (PF) 1 %-1:200000 IJ SOLN
INTRAMUSCULAR | Status: AC
  Filled 2017-04-14: qty 30

## 2017-04-14 MED ORDER — VANCOMYCIN HCL IN DEXTROSE 1-5 GM/200ML-% IV SOLN
1000.0000 mg | INTRAVENOUS | Status: AC
  Administered 2017-04-14: 1000 mg via INTRAVENOUS

## 2017-04-14 MED ORDER — LIDOCAINE 2% (20 MG/ML) 5 ML SYRINGE
INTRAMUSCULAR | Status: AC
  Filled 2017-04-14: qty 5

## 2017-04-14 MED ORDER — FENTANYL CITRATE (PF) 100 MCG/2ML IJ SOLN
INTRAMUSCULAR | Status: AC
  Filled 2017-04-14: qty 2

## 2017-04-14 MED ORDER — SODIUM CHLORIDE 0.9 % IV SOLN
INTRAVENOUS | Status: DC
  Administered 2017-04-14: 09:00:00 via INTRAVENOUS

## 2017-04-14 MED ORDER — ONDANSETRON HCL 4 MG/2ML IJ SOLN
4.0000 mg | Freq: Once | INTRAMUSCULAR | Status: AC | PRN
  Administered 2017-04-14: 4 mg via INTRAVENOUS

## 2017-04-14 MED ORDER — KETAMINE HCL 10 MG/ML IJ SOLN
INTRAMUSCULAR | Status: DC | PRN
  Administered 2017-04-14: 20 mg via INTRAVENOUS
  Administered 2017-04-14 (×2): 10 mg via INTRAVENOUS

## 2017-04-14 MED ORDER — 0.9 % SODIUM CHLORIDE (POUR BTL) OPTIME
TOPICAL | Status: DC | PRN
  Administered 2017-04-14: 1000 mL

## 2017-04-14 MED ORDER — GLYCOPYRROLATE 0.2 MG/ML IJ SOLN
INTRAMUSCULAR | Status: DC | PRN
  Administered 2017-04-14 (×2): 0.1 mg via INTRAVENOUS

## 2017-04-14 MED ORDER — KETAMINE HCL 100 MG/ML IJ SOLN
INTRAMUSCULAR | Status: AC
  Filled 2017-04-14: qty 1

## 2017-04-14 MED ORDER — ONDANSETRON HCL 4 MG/2ML IJ SOLN
INTRAMUSCULAR | Status: DC
Start: 2017-04-14 — End: 2017-04-14
  Filled 2017-04-14: qty 2

## 2017-04-14 MED ORDER — ONDANSETRON HCL 4 MG/2ML IJ SOLN
INTRAMUSCULAR | Status: AC
  Filled 2017-04-14: qty 2

## 2017-04-14 MED ORDER — CHLORHEXIDINE GLUCONATE CLOTH 2 % EX PADS: 6.0000 | MEDICATED_PAD | Freq: Once | CUTANEOUS | Status: DC

## 2017-04-14 MED ORDER — FENTANYL CITRATE (PF) 250 MCG/5ML IJ SOLN
INTRAMUSCULAR | Status: AC
  Filled 2017-04-14: qty 5

## 2017-04-14 MED ORDER — LABETALOL HCL 5 MG/ML IV SOLN
5.0000 mg | INTRAVENOUS | Status: DC | PRN
Start: 2017-04-14 — End: 2017-04-14
  Administered 2017-04-14 (×4): 5 mg via INTRAVENOUS

## 2017-04-14 MED ORDER — VANCOMYCIN HCL IN DEXTROSE 1-5 GM/200ML-% IV SOLN
INTRAVENOUS | Status: AC
  Filled 2017-04-14: qty 200

## 2017-04-14 MED ORDER — ONDANSETRON HCL 4 MG/2ML IJ SOLN
INTRAMUSCULAR | Status: DC | PRN
  Administered 2017-04-14: 4 mg via INTRAVENOUS

## 2017-04-14 MED ORDER — SODIUM CHLORIDE 0.9 % IV SOLN
INTRAVENOUS | Status: DC | PRN
  Administered 2017-04-14: 11:00:00 500 mL

## 2017-04-14 MED ORDER — PROPOFOL 10 MG/ML IV BOLUS
INTRAVENOUS | Status: AC
  Filled 2017-04-14: qty 20

## 2017-04-14 MED ORDER — MIDAZOLAM HCL 2 MG/2ML IJ SOLN
INTRAMUSCULAR | Status: AC
  Filled 2017-04-14: qty 2

## 2017-04-14 MED ORDER — PROPOFOL 10 MG/ML IV BOLUS
INTRAVENOUS | Status: DC | PRN
  Administered 2017-04-14: 150 mg via INTRAVENOUS

## 2017-04-14 MED ORDER — OXYCODONE-ACETAMINOPHEN 5-325 MG PO TABS: 1.0000 | ORAL_TABLET | Freq: Four times a day (QID) | ORAL | 0 refills | Status: DC | PRN

## 2017-04-14 MED ORDER — ALBUTEROL SULFATE HFA 108 (90 BASE) MCG/ACT IN AERS
INHALATION_SPRAY | RESPIRATORY_TRACT | Status: DC | PRN
  Administered 2017-04-14 (×2): 2 via RESPIRATORY_TRACT

## 2017-04-14 MED ORDER — HYDRALAZINE HCL 20 MG/ML IJ SOLN
INTRAMUSCULAR | Status: DC | PRN
  Administered 2017-04-14 (×2): 5 mg via INTRAVENOUS

## 2017-04-14 MED ORDER — LIDOCAINE HCL (CARDIAC) 20 MG/ML IV SOLN
INTRAVENOUS | Status: DC | PRN
  Administered 2017-04-14: 80 mg via INTRATRACHEAL

## 2017-04-14 MED ORDER — FENTANYL CITRATE (PF) 250 MCG/5ML IJ SOLN
INTRAMUSCULAR | Status: DC | PRN
  Administered 2017-04-14: 50 ug via INTRAVENOUS
  Administered 2017-04-14 (×4): 25 ug via INTRAVENOUS
  Administered 2017-04-14: 50 ug via INTRAVENOUS
  Administered 2017-04-14: 25 ug via INTRAVENOUS

## 2017-04-14 SURGICAL SUPPLY — 39 items
ARMBAND PINK RESTRICT EXTREMIT (MISCELLANEOUS) ×3 IMPLANT
CANISTER SUCT 3000ML PPV (MISCELLANEOUS) ×3 IMPLANT
CANNULA VESSEL 3MM 2 BLNT TIP (CANNULA) ×3 IMPLANT
CLIP LIGATING EXTRA MED SLVR (CLIP) ×3 IMPLANT
CLIP LIGATING EXTRA SM BLUE (MISCELLANEOUS) ×3 IMPLANT
COVER PROBE W GEL 5X96 (DRAPES) ×3 IMPLANT
COVER SURGICAL LIGHT HANDLE (MISCELLANEOUS) ×3 IMPLANT
DECANTER SPIKE VIAL GLASS SM (MISCELLANEOUS) ×3 IMPLANT
DERMABOND ADVANCED (GAUZE/BANDAGES/DRESSINGS) ×4
DERMABOND ADVANCED .7 DNX12 (GAUZE/BANDAGES/DRESSINGS) ×2 IMPLANT
ELECT REM PT RETURN 9FT ADLT (ELECTROSURGICAL) ×3
ELECTRODE REM PT RTRN 9FT ADLT (ELECTROSURGICAL) ×1 IMPLANT
GLOVE BIO SURGEON STRL SZ 6.5 (GLOVE) ×8 IMPLANT
GLOVE BIO SURGEONS STRL SZ 6.5 (GLOVE) ×4
GLOVE BIOGEL PI IND STRL 6.5 (GLOVE) ×5 IMPLANT
GLOVE BIOGEL PI IND STRL 7.0 (GLOVE) ×2 IMPLANT
GLOVE BIOGEL PI INDICATOR 6.5 (GLOVE) ×10
GLOVE BIOGEL PI INDICATOR 7.0 (GLOVE) ×4
GLOVE ECLIPSE 6.5 STRL STRAW (GLOVE) ×6 IMPLANT
GLOVE SS BIOGEL STRL SZ 7.5 (GLOVE) ×1 IMPLANT
GLOVE SUPERSENSE BIOGEL SZ 7.5 (GLOVE) ×2
GOWN STRL REUS W/ TWL LRG LVL3 (GOWN DISPOSABLE) ×5 IMPLANT
GOWN STRL REUS W/TWL LRG LVL3 (GOWN DISPOSABLE) ×10
GOWN STRL REUS W/TWL XL LVL3 (GOWN DISPOSABLE) ×3 IMPLANT
KIT BASIN OR (CUSTOM PROCEDURE TRAY) ×3 IMPLANT
KIT ROOM TURNOVER OR (KITS) ×3 IMPLANT
NS IRRIG 1000ML POUR BTL (IV SOLUTION) ×3 IMPLANT
PACK CV ACCESS (CUSTOM PROCEDURE TRAY) ×3 IMPLANT
PAD ARMBOARD 7.5X6 YLW CONV (MISCELLANEOUS) ×6 IMPLANT
SPONGE LAP 18X18 X RAY DECT (DISPOSABLE) ×3 IMPLANT
SUT PROLENE 5 0 C 1 24 (SUTURE) ×6 IMPLANT
SUT PROLENE 6 0 CC (SUTURE) ×3 IMPLANT
SUT SILK 2 0 SH (SUTURE) IMPLANT
SUT SILK 3 0 (SUTURE) ×2
SUT SILK 3-0 18XBRD TIE 12 (SUTURE) ×1 IMPLANT
SUT VIC AB 3-0 SH 27 (SUTURE) ×6
SUT VIC AB 3-0 SH 27X BRD (SUTURE) ×3 IMPLANT
UNDERPAD 30X30 (UNDERPADS AND DIAPERS) ×3 IMPLANT
WATER STERILE IRR 1000ML POUR (IV SOLUTION) ×3 IMPLANT

## 2017-04-14 NOTE — Interval H&P Note (Signed)
History and Physical Interval Note:  04/14/2017 10:09 AM  Karen [name]  has presented today for surgery, with the diagnosis of End Stage Renal Disease N18.6  The various methods of treatment have been discussed with the patient and family. After consideration of risks, benefits and other options for treatment, the patient has consented to  Procedure(s): [place] (Right) as a surgical intervention .  The patient's history has been reviewed, patient examined, no change in status, stable for surgery.  I have reviewed the patient's chart and labs.  Questions were answered to the patient's satisfaction.     Curt Jews

## 2017-04-14 NOTE — Telephone Encounter (Signed)
-----   Message from Mena Goes, RN sent at 04/14/2017  1:05 PM EDT ----- Regarding: 4-6 weeks no lab   ----- Message ----- From: Alvia Grove, PA-C Sent: 04/14/2017  12:21 PM To: [facility] Charge Pool  S/p right 2nd stage basilic vein transposition 04/14/17  F/u with Dr. Donnetta Hutching in 4-6 weeks. No duplex.  Thanks Maudie Mercury

## 2017-04-14 NOTE — Anesthesia Postprocedure Evaluation (Signed)
Anesthesia Post Note  Patient: Karen [name]  Procedure(s) Performed: Procedure(s) (LRB): BASCILIC VEIN TRANSPOSITION-RIGHT 2ND STAGE (Right)     Patient location during evaluation: PACU Anesthesia Type: General Level of consciousness: awake and alert Pain management: pain level controlled Vital Signs Assessment: post-procedure vital signs reviewed and stable Respiratory status: spontaneous breathing, nonlabored ventilation, respiratory function stable and patient connected to nasal cannula oxygen Cardiovascular status: blood pressure returned to baseline and stable Postop Assessment: no signs of nausea or vomiting Anesthetic complications: no    Last Vitals:  Vitals:   04/14/17 1454 04/14/17 1455  BP: (!) 175/92   Pulse: 80 80  Resp: (!) 33 16  Temp:  (!) 36.2 C    Last Pain:  Vitals:   04/14/17 1503  TempSrc:   PainSc: 0-No pain                 Catalina Gravel

## 2017-04-14 NOTE — Anesthesia Preprocedure Evaluation (Addendum)
Anesthesia Evaluation  Patient identified by MRN, date of birth, ID band Patient awake    Reviewed: Allergy & Precautions, NPO status , Patient's Chart, lab work & pertinent test results  Airway Mallampati: II  TM Distance: <3 FB Neck ROM: Full    Dental no notable dental hx. (+) Poor Dentition   Pulmonary shortness of breath, former smoker,    breath sounds clear to auscultation       Cardiovascular hypertension, + Peripheral Vascular Disease   Rhythm:Regular Rate:Normal     Neuro/Psych    GI/Hepatic GERD  ,  Endo/Other  diabetes, Poorly Controlled  Renal/GU ESRFRenal disease     Musculoskeletal   Abdominal (+) + obese,   Peds  Hematology   Anesthesia Other Findings   Reproductive/Obstetrics                            Anesthesia Physical Anesthesia Plan  ASA: III  Anesthesia Plan: General   Post-op Pain Management:    Induction: Intravenous  PONV Risk Score and Plan: 4 or greater and Ondansetron, Dexamethasone, Propofol, Midazolam, Scopolamine patch - Pre-op and Treatment may vary due to age or medical condition  Airway Management Planned: LMA  Additional Equipment:   Intra-op Plan:   Post-operative Plan: Extubation in OR  Informed Consent: I have reviewed the patients History and Physical, chart, labs and discussed the procedure including the risks, benefits and alternatives for the proposed anesthesia with the patient or authorized representative who has indicated his/her understanding and acceptance.   Dental advisory given  Plan Discussed with: CRNA  Anesthesia Plan Comments:         Anesthesia Quick Evaluation

## 2017-04-14 NOTE — H&P (View-Only) (Signed)
   Patient name: Kennede Lusk Leiterman MRN: [id number] DOB: [birth year] Sex: female  REASON FOR VISIT: Follow-up for stage right brachial basilic fistula  HPI: Tawana Pasch Wailes is a 41 y.o. female . Today for follow-up of her right brachiobasilic fistula creation by myself on 02/08/2017. She continues to have hemodialysis via her tunneled catheter.  Current Outpatient Prescriptions  Medication Sig Dispense Refill  . amLODipine (NORVASC) 5 MG tablet Take 5 mg by mouth daily.    . calcium acetate (PHOSLO) 667 MG capsule Take 1,334 mg by mouth 3 (three) times daily with meals.    . insulin glargine (LANTUS) 100 UNIT/ML injection Inject 18 Units into the skin at bedtime.    . insulin lispro (HUMALOG) 100 UNIT/ML injection Inject 2-5 Units into the skin 3 (three) times daily as needed for high blood sugar. Per sliding scale if blood sugar is over 150    . oxyCODONE-acetaminophen (ROXICET) 5-325 MG tablet Take 1 tablet by mouth every 6 (six) hours as needed for severe pain. 8 tablet 0  . pantoprazole (PROTONIX) 40 MG tablet Take 40 mg by mouth daily as needed (for heartburn).     . triamcinolone cream (KENALOG) 0.1 % Apply 1 application topically 2 (two) times daily.     No current facility-administered medications for this visit.    Facility-Administered Medications Ordered in Other Visits  Medication Dose Route Frequency Provider Last Rate Last Dose  . 0.9 %  sodium chloride infusion   Intravenous Continuous Kambrea Carrasco, Arvilla Meres, MD      . 0.9 %  sodium chloride infusion   Intravenous Continuous Alese Furniss, Arvilla Meres, MD         PHYSICAL EXAM: There were no vitals filed for this visit.  GENERAL: The patient is a well-nourished female, in no acute distress. The vital signs are documented above. Well-healed antecubital incisions. Some hypertrophic scar. Excellent thrill.  Duplex today shows vein size in the 6-7 mm range throughout the majority of her arm  MEDICAL ISSUES: Good Osmany Azer  maturation of right brachiobasilic vein anastomosis fistula. Will schedule second stage transposition as an outpatient tomorrow. Patient has had a great deal difficulty in the past with presentation to the hospital with extreme hypertension or an extremely elevated blood sugars. She reports that this is been relatively good control recently for her today. Systolic blood pressure today is 150 in our office.   Rosetta Posner, MD FACS [facility] Tel [phone] Pager [phone]

## 2017-04-14 NOTE — Transfer of Care (Signed)
Immediate Anesthesia Transfer of Care Note  Patient: Karen [name]  Procedure(s) Performed: Procedure(s): BASCILIC VEIN TRANSPOSITION-RIGHT 2ND STAGE (Right)  Patient Location: PACU  Anesthesia Type:General  Level of Consciousness: awake, alert  and patient cooperative  Airway & Oxygen Therapy: Patient Spontanous Breathing and Patient connected to face mask oxygen  Post-op Assessment: Report given to RN, Post -op Vital signs reviewed and stable, Patient moving all extremities X 4 and Patient able to stick tongue midline  Post vital signs: Reviewed and stable  Last Vitals:  Vitals:   04/14/17 1340 04/14/17 1345  BP: (!) 214/133 (!) 212/135  Pulse: 93 92  Resp: 11 (!) 7  Temp: 36.7 C     Last Pain:  Vitals:   04/14/17 0846  TempSrc: Oral         Complications: No apparent anesthesia complications

## 2017-04-14 NOTE — Progress Notes (Signed)
Hypoglycemic Event  CBG: 69  Treatment:  none   Symptoms: None  Follow-up CBG: Time:0930 CBG Result:75 Possible Reasons for Event: patient took lantus 9 units this am  Comments/MD notified:Dr. Massagee stated to just "watch" blood sugar for now. No treatment at this time    [place], Eulis Canner

## 2017-04-14 NOTE — Discharge Instructions (Signed)
° °  [facility]  Discharge Instructions  AV Fistula or Graft Surgery for Dialysis Access  Please refer to the following instructions for your post-procedure care. Your surgeon or physician assistant will discuss any changes with you.  Activity  You may drive the day following your surgery, if you are comfortable and no longer taking prescription pain medication. Resume full activity as the soreness in your incision resolves.  Bathing/Showering  You may shower after you go home. Keep your incision dry for 48 hours. Do not soak in a bathtub, hot tub, or swim until the incision heals completely. You may not shower if you have a hemodialysis catheter.  Incision Care  Clean your incision with mild soap and water after 48 hours. Pat the area dry with a clean towel. You do not need a bandage unless otherwise instructed. Do not apply any ointments or creams to your incision. You may have skin glue on your incision. Do not peel it off. It will come off on its own in about one week. Your arm may swell a bit after surgery. To reduce swelling use pillows to elevate your arm so it is above your heart. Your doctor will tell you if you need to lightly wrap your arm with an ACE bandage.  Diet  Resume your normal diet. There are not special food restrictions following this procedure. In order to heal from your surgery, it is CRITICAL to get adequate nutrition. Your body requires vitamins, minerals, and protein. Vegetables are the best source of vitamins and minerals. Vegetables also provide the perfect balance of protein. Processed food has little nutritional value, so try to avoid this.  Medications  Resume taking all of your medications. If your incision is causing pain, you may take over-the counter pain relievers such as acetaminophen (Tylenol). If you were prescribed a stronger pain medication, please be aware these medications can cause nausea and constipation. Prevent  nausea by taking the medication with a snack or meal. Avoid constipation by drinking plenty of fluids and eating foods with high amount of fiber, such as fruits, vegetables, and grains. Do not take Tylenol if you are taking prescription pain medications.     Follow up Your surgeon may want to see you in the office following your access surgery. If so, this will be arranged at the time of your surgery.  Please call us immediately for any of the following conditions:  Increased pain, redness, drainage (pus) from your incision site Fever of 101 degrees or higher Severe or worsening pain at your incision site Hand pain or numbness.  Reduce your risk of vascular disease:  Stop smoking. If you would like help, call [facility] at 1-800-QUIT-NOW 843-159-8638) or [place] [facility] [place] your cholesterol Maintain a desired weight Control your diabetes Keep your blood pressure down  Dialysis  It will take several weeks to several months for your new dialysis access to be ready for use. Your surgeon will determine when it is OK to use it. Your nephrologist will continue to direct your dialysis. You can continue to use your Permcath until your new access is ready for use.   04/14/2017 Karen [name] [id number] 23-Aug-1976  Surgeon(s): Early, Arvilla Meres, MD  Procedure(s): BASCILIC VEIN TRANSPOSITION-RIGHT 2ND STAGE  x Do not stick graft for 8 weeks    If you have any questions, please call the office at [phone].

## 2017-04-14 NOTE — Op Note (Signed)
    OPERATIVE REPORT  DATE OF SURGERY: 04/14/2017  PATIENT: Karen [name], 41 y.o. female MRN: [id number]  DOB: [birth year]  PRE-OPERATIVE DIAGNOSIS: End-stage renal disease  POST-OPERATIVE DIAGNOSIS:  Same  PROCEDURE: Second stage basilic vein transposition right arm  SURGEON:  Curt Jews, M.D.  PHYSICIAN ASSISTANT: Pervis Hocking [facility]  ANESTHESIA:  Gen.  EBL: Minimal ml  Total I/O In: -  Out: 100 [Blood:100]  BLOOD ADMINISTERED: None  DRAINS: None  SPECIMEN: None  COUNTS CORRECT:  YES  PLAN OF CARE: PACU   PATIENT DISPOSITION:  PACU - hemodynamically stable  PROCEDURE DETAILS: The patient was taken to the operating placed supine position where the area of the right arm right axilla were prepped and draped in sterile fashion. SonoSite ultrasound was used to identify the level of the basilic vein. She had a prior first stage basilic vein fistula creation proximally one half months ago. Incision was made over the antecubital space and carried and isolate the brachial artery to basilic vein anastomosis. 3 separate incisions were made in the upper arm extending up to the axilla over the level of the basilic vein. Basilic vein branches were ligated with 3-0 silk ties and divided. The vein was mobilized circumferentially circumferentially in its entirety up to the axilla. The vein was marked to reduce risk twisting the tunnel. The vein was occluded at the brachial artery anastomosis and was transected. The vein was brought out through the tunnel through the axilla. The vein was gently dilated was of excellent diameter. A subcutaneous tunnel was created from the level antecubital space to the axilla and the vein was brought through the tunnel. The vein was sewn into into a small segment of basilic vein that had been left on the brachial artery after spatulating both proximal and distal ends of the vein. This was with a running 6-0 Prolene suture. Clamps were removed and excellent thrill was  noted. The wound irrigated with saline. Hemostasis with cautery. The wounds were closed with 3-0 Vicryl subcutaneous and subcuticular tissue. Sterile dressing was applied and the patient was transferred to the recovery room stable condition. Patient had good Doppler flow at the radial artery and there was no significant augmentation with occlusion of the fistula.   Rosetta Posner, M.D., [facility] 04/14/2017 3:24 PM

## 2017-04-14 NOTE — Anesthesia Procedure Notes (Addendum)
Procedure Name: LMA Insertion Date/Time: 04/14/2017 10:51 AM Performed by: Catalina Gravel Pre-anesthesia Checklist: Patient identified, Emergency Drugs available, Suction available and Patient being monitored Patient Re-evaluated:Patient Re-evaluated prior to induction Oxygen Delivery Method: Circle system utilized Preoxygenation: Pre-oxygenation with 100% oxygen Induction Type: IV induction LMA: LMA inserted LMA Size: 4.0 Number of attempts: 1 Placement Confirmation: positive ETCO2 and breath sounds checked- equal and bilateral Tube secured with: Tape Dental Injury: Teeth and Oropharynx as per pre-operative assessment

## 2017-04-14 NOTE — Telephone Encounter (Signed)
Sched appt 06/01/17 at 8:45. Lm on hm#.

## 2017-04-15 ENCOUNTER — Encounter (HOSPITAL_COMMUNITY): Payer: Self-pay | Admitting: Vascular Surgery

## 2017-04-15 DIAGNOSIS — N2581 Secondary hyperparathyroidism of renal origin: Secondary | ICD-10-CM | POA: Diagnosis not present

## 2017-04-15 DIAGNOSIS — E1129 Type 2 diabetes mellitus with other diabetic kidney complication: Secondary | ICD-10-CM | POA: Diagnosis not present

## 2017-04-15 DIAGNOSIS — D509 Iron deficiency anemia, unspecified: Secondary | ICD-10-CM | POA: Diagnosis not present

## 2017-04-15 DIAGNOSIS — N186 End stage renal disease: Secondary | ICD-10-CM | POA: Diagnosis not present

## 2017-04-15 DIAGNOSIS — D631 Anemia in chronic kidney disease: Secondary | ICD-10-CM | POA: Diagnosis not present

## 2017-04-20 DIAGNOSIS — E1129 Type 2 diabetes mellitus with other diabetic kidney complication: Secondary | ICD-10-CM | POA: Diagnosis not present

## 2017-04-20 DIAGNOSIS — N186 End stage renal disease: Secondary | ICD-10-CM | POA: Diagnosis not present

## 2017-04-20 DIAGNOSIS — Z992 Dependence on renal dialysis: Secondary | ICD-10-CM | POA: Diagnosis not present

## 2017-04-20 DIAGNOSIS — E1122 Type 2 diabetes mellitus with diabetic chronic kidney disease: Secondary | ICD-10-CM | POA: Diagnosis not present

## 2017-04-20 DIAGNOSIS — N2581 Secondary hyperparathyroidism of renal origin: Secondary | ICD-10-CM | POA: Diagnosis not present

## 2017-04-20 DIAGNOSIS — D631 Anemia in chronic kidney disease: Secondary | ICD-10-CM | POA: Diagnosis not present

## 2017-04-20 DIAGNOSIS — D509 Iron deficiency anemia, unspecified: Secondary | ICD-10-CM | POA: Diagnosis not present

## 2017-04-21 DIAGNOSIS — E113592 Type 2 diabetes mellitus with proliferative diabetic retinopathy without macular edema, left eye: Secondary | ICD-10-CM | POA: Diagnosis not present

## 2017-04-23 DIAGNOSIS — D509 Iron deficiency anemia, unspecified: Secondary | ICD-10-CM | POA: Diagnosis not present

## 2017-04-23 DIAGNOSIS — D631 Anemia in chronic kidney disease: Secondary | ICD-10-CM | POA: Diagnosis not present

## 2017-04-23 DIAGNOSIS — N186 End stage renal disease: Secondary | ICD-10-CM | POA: Diagnosis not present

## 2017-04-23 DIAGNOSIS — N2581 Secondary hyperparathyroidism of renal origin: Secondary | ICD-10-CM | POA: Diagnosis not present

## 2017-04-23 DIAGNOSIS — E1129 Type 2 diabetes mellitus with other diabetic kidney complication: Secondary | ICD-10-CM | POA: Diagnosis not present

## 2017-04-24 DIAGNOSIS — D631 Anemia in chronic kidney disease: Secondary | ICD-10-CM | POA: Diagnosis not present

## 2017-04-24 DIAGNOSIS — N2581 Secondary hyperparathyroidism of renal origin: Secondary | ICD-10-CM | POA: Diagnosis not present

## 2017-04-24 DIAGNOSIS — E1129 Type 2 diabetes mellitus with other diabetic kidney complication: Secondary | ICD-10-CM | POA: Diagnosis not present

## 2017-04-24 DIAGNOSIS — D509 Iron deficiency anemia, unspecified: Secondary | ICD-10-CM | POA: Diagnosis not present

## 2017-04-24 DIAGNOSIS — N186 End stage renal disease: Secondary | ICD-10-CM | POA: Diagnosis not present

## 2017-04-27 DIAGNOSIS — D631 Anemia in chronic kidney disease: Secondary | ICD-10-CM | POA: Diagnosis not present

## 2017-04-27 DIAGNOSIS — D509 Iron deficiency anemia, unspecified: Secondary | ICD-10-CM | POA: Diagnosis not present

## 2017-04-27 DIAGNOSIS — N186 End stage renal disease: Secondary | ICD-10-CM | POA: Diagnosis not present

## 2017-04-27 DIAGNOSIS — N2581 Secondary hyperparathyroidism of renal origin: Secondary | ICD-10-CM | POA: Diagnosis not present

## 2017-04-27 DIAGNOSIS — E1129 Type 2 diabetes mellitus with other diabetic kidney complication: Secondary | ICD-10-CM | POA: Diagnosis not present

## 2017-04-29 DIAGNOSIS — D631 Anemia in chronic kidney disease: Secondary | ICD-10-CM | POA: Diagnosis not present

## 2017-04-29 DIAGNOSIS — D509 Iron deficiency anemia, unspecified: Secondary | ICD-10-CM | POA: Diagnosis not present

## 2017-04-29 DIAGNOSIS — E1129 Type 2 diabetes mellitus with other diabetic kidney complication: Secondary | ICD-10-CM | POA: Diagnosis not present

## 2017-04-29 DIAGNOSIS — N186 End stage renal disease: Secondary | ICD-10-CM | POA: Diagnosis not present

## 2017-04-29 DIAGNOSIS — N2581 Secondary hyperparathyroidism of renal origin: Secondary | ICD-10-CM | POA: Diagnosis not present

## 2017-05-03 DIAGNOSIS — N2581 Secondary hyperparathyroidism of renal origin: Secondary | ICD-10-CM | POA: Diagnosis not present

## 2017-05-03 DIAGNOSIS — E1129 Type 2 diabetes mellitus with other diabetic kidney complication: Secondary | ICD-10-CM | POA: Diagnosis not present

## 2017-05-03 DIAGNOSIS — D631 Anemia in chronic kidney disease: Secondary | ICD-10-CM | POA: Diagnosis not present

## 2017-05-03 DIAGNOSIS — N186 End stage renal disease: Secondary | ICD-10-CM | POA: Diagnosis not present

## 2017-05-03 DIAGNOSIS — D509 Iron deficiency anemia, unspecified: Secondary | ICD-10-CM | POA: Diagnosis not present

## 2017-05-04 DIAGNOSIS — N2581 Secondary hyperparathyroidism of renal origin: Secondary | ICD-10-CM | POA: Diagnosis not present

## 2017-05-04 DIAGNOSIS — E1129 Type 2 diabetes mellitus with other diabetic kidney complication: Secondary | ICD-10-CM | POA: Diagnosis not present

## 2017-05-04 DIAGNOSIS — N186 End stage renal disease: Secondary | ICD-10-CM | POA: Diagnosis not present

## 2017-05-04 DIAGNOSIS — D509 Iron deficiency anemia, unspecified: Secondary | ICD-10-CM | POA: Diagnosis not present

## 2017-05-04 DIAGNOSIS — D631 Anemia in chronic kidney disease: Secondary | ICD-10-CM | POA: Diagnosis not present

## 2017-05-05 DIAGNOSIS — E113591 Type 2 diabetes mellitus with proliferative diabetic retinopathy without macular edema, right eye: Secondary | ICD-10-CM | POA: Diagnosis not present

## 2017-05-06 DIAGNOSIS — E1129 Type 2 diabetes mellitus with other diabetic kidney complication: Secondary | ICD-10-CM | POA: Diagnosis not present

## 2017-05-06 DIAGNOSIS — D631 Anemia in chronic kidney disease: Secondary | ICD-10-CM | POA: Diagnosis not present

## 2017-05-06 DIAGNOSIS — N186 End stage renal disease: Secondary | ICD-10-CM | POA: Diagnosis not present

## 2017-05-06 DIAGNOSIS — D509 Iron deficiency anemia, unspecified: Secondary | ICD-10-CM | POA: Diagnosis not present

## 2017-05-06 DIAGNOSIS — N2581 Secondary hyperparathyroidism of renal origin: Secondary | ICD-10-CM | POA: Diagnosis not present

## 2017-05-08 DIAGNOSIS — E1129 Type 2 diabetes mellitus with other diabetic kidney complication: Secondary | ICD-10-CM | POA: Diagnosis not present

## 2017-05-08 DIAGNOSIS — N186 End stage renal disease: Secondary | ICD-10-CM | POA: Diagnosis not present

## 2017-05-08 DIAGNOSIS — D509 Iron deficiency anemia, unspecified: Secondary | ICD-10-CM | POA: Diagnosis not present

## 2017-05-08 DIAGNOSIS — D631 Anemia in chronic kidney disease: Secondary | ICD-10-CM | POA: Diagnosis not present

## 2017-05-08 DIAGNOSIS — N2581 Secondary hyperparathyroidism of renal origin: Secondary | ICD-10-CM | POA: Diagnosis not present

## 2017-05-11 DIAGNOSIS — E1129 Type 2 diabetes mellitus with other diabetic kidney complication: Secondary | ICD-10-CM | POA: Diagnosis not present

## 2017-05-11 DIAGNOSIS — N186 End stage renal disease: Secondary | ICD-10-CM | POA: Diagnosis not present

## 2017-05-11 DIAGNOSIS — D509 Iron deficiency anemia, unspecified: Secondary | ICD-10-CM | POA: Diagnosis not present

## 2017-05-11 DIAGNOSIS — D631 Anemia in chronic kidney disease: Secondary | ICD-10-CM | POA: Diagnosis not present

## 2017-05-11 DIAGNOSIS — N2581 Secondary hyperparathyroidism of renal origin: Secondary | ICD-10-CM | POA: Diagnosis not present

## 2017-05-13 DIAGNOSIS — D509 Iron deficiency anemia, unspecified: Secondary | ICD-10-CM | POA: Diagnosis not present

## 2017-05-13 DIAGNOSIS — E1129 Type 2 diabetes mellitus with other diabetic kidney complication: Secondary | ICD-10-CM | POA: Diagnosis not present

## 2017-05-13 DIAGNOSIS — D631 Anemia in chronic kidney disease: Secondary | ICD-10-CM | POA: Diagnosis not present

## 2017-05-13 DIAGNOSIS — N186 End stage renal disease: Secondary | ICD-10-CM | POA: Diagnosis not present

## 2017-05-13 DIAGNOSIS — N2581 Secondary hyperparathyroidism of renal origin: Secondary | ICD-10-CM | POA: Diagnosis not present

## 2017-05-18 DIAGNOSIS — E1129 Type 2 diabetes mellitus with other diabetic kidney complication: Secondary | ICD-10-CM | POA: Diagnosis not present

## 2017-05-18 DIAGNOSIS — N186 End stage renal disease: Secondary | ICD-10-CM | POA: Diagnosis not present

## 2017-05-18 DIAGNOSIS — D509 Iron deficiency anemia, unspecified: Secondary | ICD-10-CM | POA: Diagnosis not present

## 2017-05-18 DIAGNOSIS — N2581 Secondary hyperparathyroidism of renal origin: Secondary | ICD-10-CM | POA: Diagnosis not present

## 2017-05-18 DIAGNOSIS — D631 Anemia in chronic kidney disease: Secondary | ICD-10-CM | POA: Diagnosis not present

## 2017-05-20 DIAGNOSIS — N2581 Secondary hyperparathyroidism of renal origin: Secondary | ICD-10-CM | POA: Diagnosis not present

## 2017-05-20 DIAGNOSIS — E1129 Type 2 diabetes mellitus with other diabetic kidney complication: Secondary | ICD-10-CM | POA: Diagnosis not present

## 2017-05-20 DIAGNOSIS — N186 End stage renal disease: Secondary | ICD-10-CM | POA: Diagnosis not present

## 2017-05-20 DIAGNOSIS — D509 Iron deficiency anemia, unspecified: Secondary | ICD-10-CM | POA: Diagnosis not present

## 2017-05-20 DIAGNOSIS — D631 Anemia in chronic kidney disease: Secondary | ICD-10-CM | POA: Diagnosis not present

## 2017-05-21 DIAGNOSIS — Z992 Dependence on renal dialysis: Secondary | ICD-10-CM | POA: Diagnosis not present

## 2017-05-21 DIAGNOSIS — N186 End stage renal disease: Secondary | ICD-10-CM | POA: Diagnosis not present

## 2017-05-21 DIAGNOSIS — E1122 Type 2 diabetes mellitus with diabetic chronic kidney disease: Secondary | ICD-10-CM | POA: Diagnosis not present

## 2017-05-22 DIAGNOSIS — E1129 Type 2 diabetes mellitus with other diabetic kidney complication: Secondary | ICD-10-CM | POA: Diagnosis not present

## 2017-05-22 DIAGNOSIS — N186 End stage renal disease: Secondary | ICD-10-CM | POA: Diagnosis not present

## 2017-05-22 DIAGNOSIS — N2581 Secondary hyperparathyroidism of renal origin: Secondary | ICD-10-CM | POA: Diagnosis not present

## 2017-05-22 DIAGNOSIS — D631 Anemia in chronic kidney disease: Secondary | ICD-10-CM | POA: Diagnosis not present

## 2017-05-27 DIAGNOSIS — E1129 Type 2 diabetes mellitus with other diabetic kidney complication: Secondary | ICD-10-CM | POA: Diagnosis not present

## 2017-05-27 DIAGNOSIS — D631 Anemia in chronic kidney disease: Secondary | ICD-10-CM | POA: Diagnosis not present

## 2017-05-27 DIAGNOSIS — N186 End stage renal disease: Secondary | ICD-10-CM | POA: Diagnosis not present

## 2017-05-27 DIAGNOSIS — N2581 Secondary hyperparathyroidism of renal origin: Secondary | ICD-10-CM | POA: Diagnosis not present

## 2017-05-31 ENCOUNTER — Encounter: Payer: Self-pay | Admitting: Vascular Surgery

## 2017-05-31 DIAGNOSIS — N2581 Secondary hyperparathyroidism of renal origin: Secondary | ICD-10-CM | POA: Diagnosis not present

## 2017-05-31 DIAGNOSIS — N186 End stage renal disease: Secondary | ICD-10-CM | POA: Diagnosis not present

## 2017-05-31 DIAGNOSIS — D631 Anemia in chronic kidney disease: Secondary | ICD-10-CM | POA: Diagnosis not present

## 2017-05-31 DIAGNOSIS — E1129 Type 2 diabetes mellitus with other diabetic kidney complication: Secondary | ICD-10-CM | POA: Diagnosis not present

## 2017-06-01 ENCOUNTER — Encounter: Payer: Medicare Other | Admitting: Vascular Surgery

## 2017-06-01 DIAGNOSIS — D631 Anemia in chronic kidney disease: Secondary | ICD-10-CM | POA: Diagnosis not present

## 2017-06-01 DIAGNOSIS — E1129 Type 2 diabetes mellitus with other diabetic kidney complication: Secondary | ICD-10-CM | POA: Diagnosis not present

## 2017-06-01 DIAGNOSIS — N186 End stage renal disease: Secondary | ICD-10-CM | POA: Diagnosis not present

## 2017-06-01 DIAGNOSIS — N2581 Secondary hyperparathyroidism of renal origin: Secondary | ICD-10-CM | POA: Diagnosis not present

## 2017-06-03 DIAGNOSIS — N186 End stage renal disease: Secondary | ICD-10-CM | POA: Diagnosis not present

## 2017-06-03 DIAGNOSIS — D631 Anemia in chronic kidney disease: Secondary | ICD-10-CM | POA: Diagnosis not present

## 2017-06-03 DIAGNOSIS — N2581 Secondary hyperparathyroidism of renal origin: Secondary | ICD-10-CM | POA: Diagnosis not present

## 2017-06-03 DIAGNOSIS — E1129 Type 2 diabetes mellitus with other diabetic kidney complication: Secondary | ICD-10-CM | POA: Diagnosis not present

## 2017-06-05 DIAGNOSIS — D631 Anemia in chronic kidney disease: Secondary | ICD-10-CM | POA: Diagnosis not present

## 2017-06-05 DIAGNOSIS — E1129 Type 2 diabetes mellitus with other diabetic kidney complication: Secondary | ICD-10-CM | POA: Diagnosis not present

## 2017-06-05 DIAGNOSIS — N186 End stage renal disease: Secondary | ICD-10-CM | POA: Diagnosis not present

## 2017-06-05 DIAGNOSIS — N2581 Secondary hyperparathyroidism of renal origin: Secondary | ICD-10-CM | POA: Diagnosis not present

## 2017-06-09 DIAGNOSIS — N2581 Secondary hyperparathyroidism of renal origin: Secondary | ICD-10-CM | POA: Diagnosis not present

## 2017-06-09 DIAGNOSIS — D631 Anemia in chronic kidney disease: Secondary | ICD-10-CM | POA: Diagnosis not present

## 2017-06-09 DIAGNOSIS — E1129 Type 2 diabetes mellitus with other diabetic kidney complication: Secondary | ICD-10-CM | POA: Diagnosis not present

## 2017-06-09 DIAGNOSIS — N186 End stage renal disease: Secondary | ICD-10-CM | POA: Diagnosis not present

## 2017-06-10 DIAGNOSIS — D631 Anemia in chronic kidney disease: Secondary | ICD-10-CM | POA: Diagnosis not present

## 2017-06-10 DIAGNOSIS — E1129 Type 2 diabetes mellitus with other diabetic kidney complication: Secondary | ICD-10-CM | POA: Diagnosis not present

## 2017-06-10 DIAGNOSIS — E113593 Type 2 diabetes mellitus with proliferative diabetic retinopathy without macular edema, bilateral: Secondary | ICD-10-CM | POA: Diagnosis not present

## 2017-06-10 DIAGNOSIS — N186 End stage renal disease: Secondary | ICD-10-CM | POA: Diagnosis not present

## 2017-06-10 DIAGNOSIS — N2581 Secondary hyperparathyroidism of renal origin: Secondary | ICD-10-CM | POA: Diagnosis not present

## 2017-06-12 DIAGNOSIS — N2581 Secondary hyperparathyroidism of renal origin: Secondary | ICD-10-CM | POA: Diagnosis not present

## 2017-06-12 DIAGNOSIS — D631 Anemia in chronic kidney disease: Secondary | ICD-10-CM | POA: Diagnosis not present

## 2017-06-12 DIAGNOSIS — N186 End stage renal disease: Secondary | ICD-10-CM | POA: Diagnosis not present

## 2017-06-12 DIAGNOSIS — E1129 Type 2 diabetes mellitus with other diabetic kidney complication: Secondary | ICD-10-CM | POA: Diagnosis not present

## 2017-06-14 ENCOUNTER — Telehealth: Payer: Self-pay

## 2017-06-14 NOTE — Telephone Encounter (Signed)
rec'd. voice message from [place], [place] @ the [facility] from 06/11/17. The voice message indicated that the pt. Has a f/u appt. on 06/22/17, and that her AVF is clotted since 06/10/17.  Questioned if the plan is to start over and evaluate for new fistula?  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   Discussed with Dr. Trula Slade.  Surgical hx was reviewed with him.  Advised to have the kidney center contact IR to see if they can open up the clotted fistula.  Phone call to [place] @ the [facility].  Advised of Dr. Stephens Shire recommendation for referring to IR.  Verb. Understanding.  Agreed.  (informed Caryl Pina, that the pt's f/u appt. on 10/2 will remain)

## 2017-06-15 DIAGNOSIS — E1129 Type 2 diabetes mellitus with other diabetic kidney complication: Secondary | ICD-10-CM | POA: Diagnosis not present

## 2017-06-15 DIAGNOSIS — D631 Anemia in chronic kidney disease: Secondary | ICD-10-CM | POA: Diagnosis not present

## 2017-06-15 DIAGNOSIS — N186 End stage renal disease: Secondary | ICD-10-CM | POA: Diagnosis not present

## 2017-06-15 DIAGNOSIS — N2581 Secondary hyperparathyroidism of renal origin: Secondary | ICD-10-CM | POA: Diagnosis not present

## 2017-06-18 ENCOUNTER — Encounter: Payer: Self-pay | Admitting: Vascular Surgery

## 2017-06-19 DIAGNOSIS — N186 End stage renal disease: Secondary | ICD-10-CM | POA: Diagnosis not present

## 2017-06-19 DIAGNOSIS — E1129 Type 2 diabetes mellitus with other diabetic kidney complication: Secondary | ICD-10-CM | POA: Diagnosis not present

## 2017-06-19 DIAGNOSIS — D631 Anemia in chronic kidney disease: Secondary | ICD-10-CM | POA: Diagnosis not present

## 2017-06-19 DIAGNOSIS — N2581 Secondary hyperparathyroidism of renal origin: Secondary | ICD-10-CM | POA: Diagnosis not present

## 2017-06-20 DIAGNOSIS — Z992 Dependence on renal dialysis: Secondary | ICD-10-CM | POA: Diagnosis not present

## 2017-06-20 DIAGNOSIS — N186 End stage renal disease: Secondary | ICD-10-CM | POA: Diagnosis not present

## 2017-06-20 DIAGNOSIS — E1122 Type 2 diabetes mellitus with diabetic chronic kidney disease: Secondary | ICD-10-CM | POA: Diagnosis not present

## 2017-06-22 ENCOUNTER — Encounter: Payer: Medicare Other | Admitting: Vascular Surgery

## 2017-06-22 DIAGNOSIS — N186 End stage renal disease: Secondary | ICD-10-CM | POA: Diagnosis not present

## 2017-06-22 DIAGNOSIS — N2581 Secondary hyperparathyroidism of renal origin: Secondary | ICD-10-CM | POA: Diagnosis not present

## 2017-06-22 DIAGNOSIS — E1129 Type 2 diabetes mellitus with other diabetic kidney complication: Secondary | ICD-10-CM | POA: Diagnosis not present

## 2017-06-22 DIAGNOSIS — D631 Anemia in chronic kidney disease: Secondary | ICD-10-CM | POA: Diagnosis not present

## 2017-06-22 DIAGNOSIS — Z23 Encounter for immunization: Secondary | ICD-10-CM | POA: Diagnosis not present

## 2017-06-26 DIAGNOSIS — D631 Anemia in chronic kidney disease: Secondary | ICD-10-CM | POA: Diagnosis not present

## 2017-06-26 DIAGNOSIS — N186 End stage renal disease: Secondary | ICD-10-CM | POA: Diagnosis not present

## 2017-06-26 DIAGNOSIS — N2581 Secondary hyperparathyroidism of renal origin: Secondary | ICD-10-CM | POA: Diagnosis not present

## 2017-06-26 DIAGNOSIS — Z23 Encounter for immunization: Secondary | ICD-10-CM | POA: Diagnosis not present

## 2017-06-26 DIAGNOSIS — E1129 Type 2 diabetes mellitus with other diabetic kidney complication: Secondary | ICD-10-CM | POA: Diagnosis not present

## 2017-06-29 DIAGNOSIS — Z23 Encounter for immunization: Secondary | ICD-10-CM | POA: Diagnosis not present

## 2017-06-29 DIAGNOSIS — E1129 Type 2 diabetes mellitus with other diabetic kidney complication: Secondary | ICD-10-CM | POA: Diagnosis not present

## 2017-06-29 DIAGNOSIS — N2581 Secondary hyperparathyroidism of renal origin: Secondary | ICD-10-CM | POA: Diagnosis not present

## 2017-06-29 DIAGNOSIS — N186 End stage renal disease: Secondary | ICD-10-CM | POA: Diagnosis not present

## 2017-06-29 DIAGNOSIS — D631 Anemia in chronic kidney disease: Secondary | ICD-10-CM | POA: Diagnosis not present

## 2017-07-02 DIAGNOSIS — Z23 Encounter for immunization: Secondary | ICD-10-CM | POA: Diagnosis not present

## 2017-07-02 DIAGNOSIS — D631 Anemia in chronic kidney disease: Secondary | ICD-10-CM | POA: Diagnosis not present

## 2017-07-02 DIAGNOSIS — N186 End stage renal disease: Secondary | ICD-10-CM | POA: Diagnosis not present

## 2017-07-02 DIAGNOSIS — E1129 Type 2 diabetes mellitus with other diabetic kidney complication: Secondary | ICD-10-CM | POA: Diagnosis not present

## 2017-07-02 DIAGNOSIS — N2581 Secondary hyperparathyroidism of renal origin: Secondary | ICD-10-CM | POA: Diagnosis not present

## 2017-07-06 ENCOUNTER — Encounter: Payer: Medicare Other | Admitting: Vascular Surgery

## 2017-07-06 DIAGNOSIS — E1129 Type 2 diabetes mellitus with other diabetic kidney complication: Secondary | ICD-10-CM | POA: Diagnosis not present

## 2017-07-06 DIAGNOSIS — D631 Anemia in chronic kidney disease: Secondary | ICD-10-CM | POA: Diagnosis not present

## 2017-07-06 DIAGNOSIS — N2581 Secondary hyperparathyroidism of renal origin: Secondary | ICD-10-CM | POA: Diagnosis not present

## 2017-07-06 DIAGNOSIS — N186 End stage renal disease: Secondary | ICD-10-CM | POA: Diagnosis not present

## 2017-07-06 DIAGNOSIS — Z23 Encounter for immunization: Secondary | ICD-10-CM | POA: Diagnosis not present

## 2017-07-08 ENCOUNTER — Telehealth: Payer: Self-pay | Admitting: *Deleted

## 2017-07-08 NOTE — Telephone Encounter (Signed)
Karen [name] at [facility] re: patient calling desiring appointment with Dr. Donnetta Hutching due to failed fistula. Patient has been a no show and canceled previous appointments. Will call sched appointment time to [facility].

## 2017-07-10 DIAGNOSIS — E1129 Type 2 diabetes mellitus with other diabetic kidney complication: Secondary | ICD-10-CM | POA: Diagnosis not present

## 2017-07-10 DIAGNOSIS — N186 End stage renal disease: Secondary | ICD-10-CM | POA: Diagnosis not present

## 2017-07-10 DIAGNOSIS — Z23 Encounter for immunization: Secondary | ICD-10-CM | POA: Diagnosis not present

## 2017-07-10 DIAGNOSIS — N2581 Secondary hyperparathyroidism of renal origin: Secondary | ICD-10-CM | POA: Diagnosis not present

## 2017-07-10 DIAGNOSIS — D631 Anemia in chronic kidney disease: Secondary | ICD-10-CM | POA: Diagnosis not present

## 2017-07-13 DIAGNOSIS — N2581 Secondary hyperparathyroidism of renal origin: Secondary | ICD-10-CM | POA: Diagnosis not present

## 2017-07-13 DIAGNOSIS — E1129 Type 2 diabetes mellitus with other diabetic kidney complication: Secondary | ICD-10-CM | POA: Diagnosis not present

## 2017-07-13 DIAGNOSIS — D631 Anemia in chronic kidney disease: Secondary | ICD-10-CM | POA: Diagnosis not present

## 2017-07-13 DIAGNOSIS — N186 End stage renal disease: Secondary | ICD-10-CM | POA: Diagnosis not present

## 2017-07-13 DIAGNOSIS — Z23 Encounter for immunization: Secondary | ICD-10-CM | POA: Diagnosis not present

## 2017-07-15 DIAGNOSIS — D631 Anemia in chronic kidney disease: Secondary | ICD-10-CM | POA: Diagnosis not present

## 2017-07-15 DIAGNOSIS — Z23 Encounter for immunization: Secondary | ICD-10-CM | POA: Diagnosis not present

## 2017-07-15 DIAGNOSIS — N186 End stage renal disease: Secondary | ICD-10-CM | POA: Diagnosis not present

## 2017-07-15 DIAGNOSIS — E1129 Type 2 diabetes mellitus with other diabetic kidney complication: Secondary | ICD-10-CM | POA: Diagnosis not present

## 2017-07-15 DIAGNOSIS — N2581 Secondary hyperparathyroidism of renal origin: Secondary | ICD-10-CM | POA: Diagnosis not present

## 2017-07-17 DIAGNOSIS — D631 Anemia in chronic kidney disease: Secondary | ICD-10-CM | POA: Diagnosis not present

## 2017-07-17 DIAGNOSIS — N2581 Secondary hyperparathyroidism of renal origin: Secondary | ICD-10-CM | POA: Diagnosis not present

## 2017-07-17 DIAGNOSIS — N186 End stage renal disease: Secondary | ICD-10-CM | POA: Diagnosis not present

## 2017-07-17 DIAGNOSIS — E1129 Type 2 diabetes mellitus with other diabetic kidney complication: Secondary | ICD-10-CM | POA: Diagnosis not present

## 2017-07-17 DIAGNOSIS — Z23 Encounter for immunization: Secondary | ICD-10-CM | POA: Diagnosis not present

## 2017-07-21 DIAGNOSIS — Z992 Dependence on renal dialysis: Secondary | ICD-10-CM | POA: Diagnosis not present

## 2017-07-21 DIAGNOSIS — E1122 Type 2 diabetes mellitus with diabetic chronic kidney disease: Secondary | ICD-10-CM | POA: Diagnosis not present

## 2017-07-21 DIAGNOSIS — N186 End stage renal disease: Secondary | ICD-10-CM | POA: Diagnosis not present

## 2017-07-21 DIAGNOSIS — N2581 Secondary hyperparathyroidism of renal origin: Secondary | ICD-10-CM | POA: Diagnosis not present

## 2017-07-21 DIAGNOSIS — Z23 Encounter for immunization: Secondary | ICD-10-CM | POA: Diagnosis not present

## 2017-07-21 DIAGNOSIS — E1129 Type 2 diabetes mellitus with other diabetic kidney complication: Secondary | ICD-10-CM | POA: Diagnosis not present

## 2017-07-21 DIAGNOSIS — D631 Anemia in chronic kidney disease: Secondary | ICD-10-CM | POA: Diagnosis not present

## 2017-07-26 DIAGNOSIS — D509 Iron deficiency anemia, unspecified: Secondary | ICD-10-CM | POA: Diagnosis not present

## 2017-07-26 DIAGNOSIS — N186 End stage renal disease: Secondary | ICD-10-CM | POA: Diagnosis not present

## 2017-07-26 DIAGNOSIS — N2581 Secondary hyperparathyroidism of renal origin: Secondary | ICD-10-CM | POA: Diagnosis not present

## 2017-07-26 DIAGNOSIS — T8249XA Other complication of vascular dialysis catheter, initial encounter: Secondary | ICD-10-CM | POA: Diagnosis not present

## 2017-07-26 DIAGNOSIS — D631 Anemia in chronic kidney disease: Secondary | ICD-10-CM | POA: Diagnosis not present

## 2017-07-26 DIAGNOSIS — E1129 Type 2 diabetes mellitus with other diabetic kidney complication: Secondary | ICD-10-CM | POA: Diagnosis not present

## 2017-07-27 ENCOUNTER — Ambulatory Visit: Payer: Medicare Other | Admitting: Vascular Surgery

## 2017-08-04 DIAGNOSIS — E1129 Type 2 diabetes mellitus with other diabetic kidney complication: Secondary | ICD-10-CM | POA: Diagnosis not present

## 2017-08-04 DIAGNOSIS — T8249XA Other complication of vascular dialysis catheter, initial encounter: Secondary | ICD-10-CM | POA: Diagnosis not present

## 2017-08-04 DIAGNOSIS — D631 Anemia in chronic kidney disease: Secondary | ICD-10-CM | POA: Diagnosis not present

## 2017-08-04 DIAGNOSIS — N186 End stage renal disease: Secondary | ICD-10-CM | POA: Diagnosis not present

## 2017-08-04 DIAGNOSIS — N2581 Secondary hyperparathyroidism of renal origin: Secondary | ICD-10-CM | POA: Diagnosis not present

## 2017-08-04 DIAGNOSIS — D509 Iron deficiency anemia, unspecified: Secondary | ICD-10-CM | POA: Diagnosis not present

## 2017-08-07 DIAGNOSIS — N2581 Secondary hyperparathyroidism of renal origin: Secondary | ICD-10-CM | POA: Diagnosis not present

## 2017-08-07 DIAGNOSIS — T8249XA Other complication of vascular dialysis catheter, initial encounter: Secondary | ICD-10-CM | POA: Diagnosis not present

## 2017-08-07 DIAGNOSIS — N186 End stage renal disease: Secondary | ICD-10-CM | POA: Diagnosis not present

## 2017-08-07 DIAGNOSIS — D509 Iron deficiency anemia, unspecified: Secondary | ICD-10-CM | POA: Diagnosis not present

## 2017-08-07 DIAGNOSIS — D631 Anemia in chronic kidney disease: Secondary | ICD-10-CM | POA: Diagnosis not present

## 2017-08-07 DIAGNOSIS — E1129 Type 2 diabetes mellitus with other diabetic kidney complication: Secondary | ICD-10-CM | POA: Diagnosis not present

## 2017-08-14 DIAGNOSIS — N186 End stage renal disease: Secondary | ICD-10-CM | POA: Diagnosis not present

## 2017-08-14 DIAGNOSIS — D509 Iron deficiency anemia, unspecified: Secondary | ICD-10-CM | POA: Diagnosis not present

## 2017-08-14 DIAGNOSIS — N2581 Secondary hyperparathyroidism of renal origin: Secondary | ICD-10-CM | POA: Diagnosis not present

## 2017-08-14 DIAGNOSIS — E1129 Type 2 diabetes mellitus with other diabetic kidney complication: Secondary | ICD-10-CM | POA: Diagnosis not present

## 2017-08-14 DIAGNOSIS — T8249XA Other complication of vascular dialysis catheter, initial encounter: Secondary | ICD-10-CM | POA: Diagnosis not present

## 2017-08-14 DIAGNOSIS — D631 Anemia in chronic kidney disease: Secondary | ICD-10-CM | POA: Diagnosis not present

## 2017-08-17 DIAGNOSIS — E1129 Type 2 diabetes mellitus with other diabetic kidney complication: Secondary | ICD-10-CM | POA: Diagnosis not present

## 2017-08-17 DIAGNOSIS — D509 Iron deficiency anemia, unspecified: Secondary | ICD-10-CM | POA: Diagnosis not present

## 2017-08-17 DIAGNOSIS — T8249XA Other complication of vascular dialysis catheter, initial encounter: Secondary | ICD-10-CM | POA: Diagnosis not present

## 2017-08-17 DIAGNOSIS — N2581 Secondary hyperparathyroidism of renal origin: Secondary | ICD-10-CM | POA: Diagnosis not present

## 2017-08-17 DIAGNOSIS — N186 End stage renal disease: Secondary | ICD-10-CM | POA: Diagnosis not present

## 2017-08-17 DIAGNOSIS — D631 Anemia in chronic kidney disease: Secondary | ICD-10-CM | POA: Diagnosis not present

## 2017-08-19 DIAGNOSIS — D631 Anemia in chronic kidney disease: Secondary | ICD-10-CM | POA: Diagnosis not present

## 2017-08-19 DIAGNOSIS — T8249XA Other complication of vascular dialysis catheter, initial encounter: Secondary | ICD-10-CM | POA: Diagnosis not present

## 2017-08-19 DIAGNOSIS — N2581 Secondary hyperparathyroidism of renal origin: Secondary | ICD-10-CM | POA: Diagnosis not present

## 2017-08-19 DIAGNOSIS — D509 Iron deficiency anemia, unspecified: Secondary | ICD-10-CM | POA: Diagnosis not present

## 2017-08-19 DIAGNOSIS — N186 End stage renal disease: Secondary | ICD-10-CM | POA: Diagnosis not present

## 2017-08-19 DIAGNOSIS — E1129 Type 2 diabetes mellitus with other diabetic kidney complication: Secondary | ICD-10-CM | POA: Diagnosis not present

## 2017-08-20 DIAGNOSIS — T8249XA Other complication of vascular dialysis catheter, initial encounter: Secondary | ICD-10-CM | POA: Diagnosis not present

## 2017-08-20 DIAGNOSIS — N186 End stage renal disease: Secondary | ICD-10-CM | POA: Diagnosis not present

## 2017-08-20 DIAGNOSIS — D509 Iron deficiency anemia, unspecified: Secondary | ICD-10-CM | POA: Diagnosis not present

## 2017-08-20 DIAGNOSIS — D631 Anemia in chronic kidney disease: Secondary | ICD-10-CM | POA: Diagnosis not present

## 2017-08-20 DIAGNOSIS — Z992 Dependence on renal dialysis: Secondary | ICD-10-CM | POA: Diagnosis not present

## 2017-08-20 DIAGNOSIS — E1122 Type 2 diabetes mellitus with diabetic chronic kidney disease: Secondary | ICD-10-CM | POA: Diagnosis not present

## 2017-08-20 DIAGNOSIS — N2581 Secondary hyperparathyroidism of renal origin: Secondary | ICD-10-CM | POA: Diagnosis not present

## 2017-08-20 DIAGNOSIS — E1129 Type 2 diabetes mellitus with other diabetic kidney complication: Secondary | ICD-10-CM | POA: Diagnosis not present

## 2017-08-23 DIAGNOSIS — B957 Other staphylococcus as the cause of diseases classified elsewhere: Secondary | ICD-10-CM | POA: Insufficient documentation

## 2017-08-24 DIAGNOSIS — E1165 Type 2 diabetes mellitus with hyperglycemia: Secondary | ICD-10-CM | POA: Diagnosis not present

## 2017-08-24 DIAGNOSIS — E559 Vitamin D deficiency, unspecified: Secondary | ICD-10-CM | POA: Diagnosis not present

## 2017-08-24 DIAGNOSIS — E782 Mixed hyperlipidemia: Secondary | ICD-10-CM | POA: Diagnosis not present

## 2017-08-24 DIAGNOSIS — E039 Hypothyroidism, unspecified: Secondary | ICD-10-CM | POA: Diagnosis not present

## 2017-08-24 DIAGNOSIS — E790 Hyperuricemia without signs of inflammatory arthritis and tophaceous disease: Secondary | ICD-10-CM | POA: Diagnosis not present

## 2017-08-24 DIAGNOSIS — I10 Essential (primary) hypertension: Secondary | ICD-10-CM | POA: Diagnosis not present

## 2017-08-27 DIAGNOSIS — N186 End stage renal disease: Secondary | ICD-10-CM | POA: Diagnosis not present

## 2017-08-27 DIAGNOSIS — E1129 Type 2 diabetes mellitus with other diabetic kidney complication: Secondary | ICD-10-CM | POA: Diagnosis not present

## 2017-08-27 DIAGNOSIS — D509 Iron deficiency anemia, unspecified: Secondary | ICD-10-CM | POA: Diagnosis not present

## 2017-08-27 DIAGNOSIS — B957 Other staphylococcus as the cause of diseases classified elsewhere: Secondary | ICD-10-CM | POA: Diagnosis not present

## 2017-08-27 DIAGNOSIS — T8249XA Other complication of vascular dialysis catheter, initial encounter: Secondary | ICD-10-CM | POA: Diagnosis not present

## 2017-08-27 DIAGNOSIS — N2581 Secondary hyperparathyroidism of renal origin: Secondary | ICD-10-CM | POA: Diagnosis not present

## 2017-08-27 DIAGNOSIS — D631 Anemia in chronic kidney disease: Secondary | ICD-10-CM | POA: Diagnosis not present

## 2017-08-30 DIAGNOSIS — E1165 Type 2 diabetes mellitus with hyperglycemia: Secondary | ICD-10-CM | POA: Diagnosis not present

## 2017-08-30 DIAGNOSIS — M545 Low back pain: Secondary | ICD-10-CM | POA: Diagnosis not present

## 2017-08-30 DIAGNOSIS — L910 Hypertrophic scar: Secondary | ICD-10-CM | POA: Diagnosis not present

## 2017-08-30 DIAGNOSIS — R52 Pain, unspecified: Secondary | ICD-10-CM | POA: Diagnosis not present

## 2017-08-30 DIAGNOSIS — M5489 Other dorsalgia: Secondary | ICD-10-CM | POA: Diagnosis not present

## 2017-08-30 DIAGNOSIS — I10 Essential (primary) hypertension: Secondary | ICD-10-CM | POA: Diagnosis not present

## 2017-08-30 DIAGNOSIS — L309 Dermatitis, unspecified: Secondary | ICD-10-CM | POA: Diagnosis not present

## 2017-09-02 DIAGNOSIS — D509 Iron deficiency anemia, unspecified: Secondary | ICD-10-CM | POA: Diagnosis not present

## 2017-09-02 DIAGNOSIS — E1129 Type 2 diabetes mellitus with other diabetic kidney complication: Secondary | ICD-10-CM | POA: Diagnosis not present

## 2017-09-02 DIAGNOSIS — N2581 Secondary hyperparathyroidism of renal origin: Secondary | ICD-10-CM | POA: Diagnosis not present

## 2017-09-02 DIAGNOSIS — B957 Other staphylococcus as the cause of diseases classified elsewhere: Secondary | ICD-10-CM | POA: Diagnosis not present

## 2017-09-02 DIAGNOSIS — T8249XA Other complication of vascular dialysis catheter, initial encounter: Secondary | ICD-10-CM | POA: Diagnosis not present

## 2017-09-02 DIAGNOSIS — N186 End stage renal disease: Secondary | ICD-10-CM | POA: Diagnosis not present

## 2017-09-06 DIAGNOSIS — R079 Chest pain, unspecified: Secondary | ICD-10-CM | POA: Diagnosis not present

## 2017-09-06 DIAGNOSIS — Z87891 Personal history of nicotine dependence: Secondary | ICD-10-CM | POA: Diagnosis not present

## 2017-09-06 DIAGNOSIS — E109 Type 1 diabetes mellitus without complications: Secondary | ICD-10-CM | POA: Diagnosis not present

## 2017-09-06 DIAGNOSIS — Z9114 Patient's other noncompliance with medication regimen: Secondary | ICD-10-CM | POA: Diagnosis not present

## 2017-09-06 DIAGNOSIS — R0602 Shortness of breath: Secondary | ICD-10-CM | POA: Diagnosis not present

## 2017-09-06 DIAGNOSIS — Z9119 Patient's noncompliance with other medical treatment and regimen: Secondary | ICD-10-CM | POA: Diagnosis not present

## 2017-09-06 DIAGNOSIS — I10 Essential (primary) hypertension: Secondary | ICD-10-CM | POA: Diagnosis not present

## 2017-09-06 DIAGNOSIS — M545 Low back pain: Secondary | ICD-10-CM | POA: Diagnosis not present

## 2017-09-07 ENCOUNTER — Inpatient Hospital Stay (HOSPITAL_COMMUNITY)
Admission: EM | Admit: 2017-09-07 | Discharge: 2017-09-15 | DRG: 628 | Disposition: A | Discharge status: Skilled Nursing Facility | Payer: Medicare Other | Attending: Internal Medicine | Admitting: Internal Medicine

## 2017-09-07 ENCOUNTER — Encounter (HOSPITAL_COMMUNITY): Payer: Self-pay | Admitting: Emergency Medicine

## 2017-09-07 ENCOUNTER — Emergency Department (HOSPITAL_COMMUNITY): Payer: Medicare Other

## 2017-09-07 ENCOUNTER — Other Ambulatory Visit: Payer: Self-pay

## 2017-09-07 DIAGNOSIS — Z888 Allergy status to other drugs, medicaments and biological substances status: Secondary | ICD-10-CM

## 2017-09-07 DIAGNOSIS — R471 Dysarthria and anarthria: Secondary | ICD-10-CM

## 2017-09-07 DIAGNOSIS — Z01810 Encounter for preprocedural cardiovascular examination: Secondary | ICD-10-CM | POA: Diagnosis not present

## 2017-09-07 DIAGNOSIS — D631 Anemia in chronic kidney disease: Secondary | ICD-10-CM | POA: Diagnosis present

## 2017-09-07 DIAGNOSIS — E1142 Type 2 diabetes mellitus with diabetic polyneuropathy: Secondary | ICD-10-CM | POA: Diagnosis present

## 2017-09-07 DIAGNOSIS — N185 Chronic kidney disease, stage 5: Secondary | ICD-10-CM | POA: Diagnosis not present

## 2017-09-07 DIAGNOSIS — M549 Dorsalgia, unspecified: Secondary | ICD-10-CM | POA: Diagnosis not present

## 2017-09-07 DIAGNOSIS — F419 Anxiety disorder, unspecified: Secondary | ICD-10-CM | POA: Diagnosis present

## 2017-09-07 DIAGNOSIS — G464 Cerebellar stroke syndrome: Secondary | ICD-10-CM | POA: Diagnosis not present

## 2017-09-07 DIAGNOSIS — E11649 Type 2 diabetes mellitus with hypoglycemia without coma: Secondary | ICD-10-CM | POA: Diagnosis not present

## 2017-09-07 DIAGNOSIS — R0682 Tachypnea, not elsewhere classified: Secondary | ICD-10-CM

## 2017-09-07 DIAGNOSIS — Z6833 Body mass index (BMI) 33.0-33.9, adult: Secondary | ICD-10-CM | POA: Diagnosis not present

## 2017-09-07 DIAGNOSIS — E1151 Type 2 diabetes mellitus with diabetic peripheral angiopathy without gangrene: Secondary | ICD-10-CM | POA: Diagnosis present

## 2017-09-07 DIAGNOSIS — J9811 Atelectasis: Secondary | ICD-10-CM

## 2017-09-07 DIAGNOSIS — Z79899 Other long term (current) drug therapy: Secondary | ICD-10-CM

## 2017-09-07 DIAGNOSIS — Z885 Allergy status to narcotic agent status: Secondary | ICD-10-CM

## 2017-09-07 DIAGNOSIS — Z794 Long term (current) use of insulin: Secondary | ICD-10-CM

## 2017-09-07 DIAGNOSIS — L988 Other specified disorders of the skin and subcutaneous tissue: Secondary | ICD-10-CM | POA: Diagnosis present

## 2017-09-07 DIAGNOSIS — M7918 Myalgia, other site: Secondary | ICD-10-CM | POA: Diagnosis not present

## 2017-09-07 DIAGNOSIS — M6281 Muscle weakness (generalized): Secondary | ICD-10-CM | POA: Diagnosis not present

## 2017-09-07 DIAGNOSIS — E875 Hyperkalemia: Secondary | ICD-10-CM | POA: Diagnosis present

## 2017-09-07 DIAGNOSIS — E669 Obesity, unspecified: Secondary | ICD-10-CM | POA: Diagnosis not present

## 2017-09-07 DIAGNOSIS — I639 Cerebral infarction, unspecified: Secondary | ICD-10-CM | POA: Diagnosis not present

## 2017-09-07 DIAGNOSIS — Z992 Dependence on renal dialysis: Secondary | ICD-10-CM

## 2017-09-07 DIAGNOSIS — R0789 Other chest pain: Secondary | ICD-10-CM | POA: Diagnosis present

## 2017-09-07 DIAGNOSIS — R1310 Dysphagia, unspecified: Secondary | ICD-10-CM | POA: Diagnosis not present

## 2017-09-07 DIAGNOSIS — E1140 Type 2 diabetes mellitus with diabetic neuropathy, unspecified: Secondary | ICD-10-CM | POA: Diagnosis not present

## 2017-09-07 DIAGNOSIS — R4781 Slurred speech: Secondary | ICD-10-CM | POA: Diagnosis not present

## 2017-09-07 DIAGNOSIS — R1110 Vomiting, unspecified: Secondary | ICD-10-CM | POA: Diagnosis not present

## 2017-09-07 DIAGNOSIS — I120 Hypertensive chronic kidney disease with stage 5 chronic kidney disease or end stage renal disease: Secondary | ICD-10-CM | POA: Diagnosis not present

## 2017-09-07 DIAGNOSIS — K219 Gastro-esophageal reflux disease without esophagitis: Secondary | ICD-10-CM | POA: Diagnosis present

## 2017-09-07 DIAGNOSIS — E785 Hyperlipidemia, unspecified: Secondary | ICD-10-CM | POA: Diagnosis present

## 2017-09-07 DIAGNOSIS — Z87891 Personal history of nicotine dependence: Secondary | ICD-10-CM

## 2017-09-07 DIAGNOSIS — R21 Rash and other nonspecific skin eruption: Secondary | ICD-10-CM | POA: Diagnosis not present

## 2017-09-07 DIAGNOSIS — I10 Essential (primary) hypertension: Secondary | ICD-10-CM | POA: Diagnosis not present

## 2017-09-07 DIAGNOSIS — N186 End stage renal disease: Secondary | ICD-10-CM | POA: Diagnosis not present

## 2017-09-07 DIAGNOSIS — Z8249 Family history of ischemic heart disease and other diseases of the circulatory system: Secondary | ICD-10-CM

## 2017-09-07 DIAGNOSIS — Z89412 Acquired absence of left great toe: Secondary | ICD-10-CM

## 2017-09-07 DIAGNOSIS — R930 Abnormal findings on diagnostic imaging of skull and head, not elsewhere classified: Secondary | ICD-10-CM | POA: Diagnosis not present

## 2017-09-07 DIAGNOSIS — R279 Unspecified lack of coordination: Secondary | ICD-10-CM | POA: Diagnosis not present

## 2017-09-07 DIAGNOSIS — I6381 Other cerebral infarction due to occlusion or stenosis of small artery: Secondary | ICD-10-CM | POA: Diagnosis not present

## 2017-09-07 DIAGNOSIS — R079 Chest pain, unspecified: Secondary | ICD-10-CM | POA: Diagnosis not present

## 2017-09-07 DIAGNOSIS — J181 Lobar pneumonia, unspecified organism: Secondary | ICD-10-CM

## 2017-09-07 DIAGNOSIS — I255 Ischemic cardiomyopathy: Secondary | ICD-10-CM | POA: Diagnosis not present

## 2017-09-07 DIAGNOSIS — N2581 Secondary hyperparathyroidism of renal origin: Secondary | ICD-10-CM | POA: Diagnosis present

## 2017-09-07 DIAGNOSIS — E8779 Other fluid overload: Secondary | ICD-10-CM | POA: Diagnosis not present

## 2017-09-07 DIAGNOSIS — D696 Thrombocytopenia, unspecified: Secondary | ICD-10-CM | POA: Diagnosis not present

## 2017-09-07 DIAGNOSIS — J189 Pneumonia, unspecified organism: Secondary | ICD-10-CM

## 2017-09-07 DIAGNOSIS — E119 Type 2 diabetes mellitus without complications: Secondary | ICD-10-CM | POA: Diagnosis not present

## 2017-09-07 DIAGNOSIS — R262 Difficulty in walking, not elsewhere classified: Secondary | ICD-10-CM | POA: Diagnosis not present

## 2017-09-07 DIAGNOSIS — Z89512 Acquired absence of left leg below knee: Secondary | ICD-10-CM | POA: Diagnosis not present

## 2017-09-07 DIAGNOSIS — J90 Pleural effusion, not elsewhere classified: Secondary | ICD-10-CM | POA: Diagnosis not present

## 2017-09-07 DIAGNOSIS — R488 Other symbolic dysfunctions: Secondary | ICD-10-CM | POA: Diagnosis not present

## 2017-09-07 DIAGNOSIS — J168 Pneumonia due to other specified infectious organisms: Secondary | ICD-10-CM | POA: Diagnosis not present

## 2017-09-07 DIAGNOSIS — E1122 Type 2 diabetes mellitus with diabetic chronic kidney disease: Secondary | ICD-10-CM | POA: Diagnosis present

## 2017-09-07 DIAGNOSIS — Z880 Allergy status to penicillin: Secondary | ICD-10-CM

## 2017-09-07 DIAGNOSIS — I361 Nonrheumatic tricuspid (valve) insufficiency: Secondary | ICD-10-CM | POA: Diagnosis not present

## 2017-09-07 DIAGNOSIS — Z833 Family history of diabetes mellitus: Secondary | ICD-10-CM

## 2017-09-07 LAB — COMPREHENSIVE METABOLIC PANEL
ALT: 11 U/L — ABNORMAL LOW (ref 14–54)
ANION GAP: 15 (ref 5–15)
AST: 14 U/L — AB (ref 15–41)
Albumin: 3.7 g/dL (ref 3.5–5.0)
Alkaline Phosphatase: 74 U/L (ref 38–126)
BILIRUBIN TOTAL: 0.8 mg/dL (ref 0.3–1.2)
BUN: 73 mg/dL — ABNORMAL HIGH (ref 6–20)
CALCIUM: 8.8 mg/dL — AB (ref 8.9–10.3)
CHLORIDE: 101 mmol/L (ref 101–111)
CO2: 20 mmol/L — AB (ref 22–32)
Creatinine, Ser: 13.19 mg/dL — ABNORMAL HIGH (ref 0.44–1.00)
GFR, EST AFRICAN AMERICAN: 4 mL/min — AB (ref >60)
GFR, EST NON AFRICAN AMERICAN: 3 mL/min — AB (ref >60)
Glucose, Bld: 151 mg/dL — ABNORMAL HIGH (ref 65–99)
POTASSIUM: 5.5 mmol/L — AB (ref 3.5–5.1)
Sodium: 136 mmol/L (ref 135–145)
Total Protein: 8.1 g/dL (ref 6.5–8.1)

## 2017-09-07 LAB — I-STAT BETA HCG BLOOD, ED (MC, WL, AP ONLY)

## 2017-09-07 LAB — CBC WITH DIFFERENTIAL/PLATELET
BASOS ABS: 0 10*3/uL (ref 0.0–0.1)
Basophils Relative: 0 %
EOS PCT: 2 %
Eosinophils Absolute: 0.2 10*3/uL (ref 0.0–0.7)
HEMATOCRIT: 36.9 % (ref 36.0–46.0)
Hemoglobin: 11.3 g/dL — ABNORMAL LOW (ref 12.0–15.0)
LYMPHS ABS: 1.7 10*3/uL (ref 0.7–4.0)
LYMPHS PCT: 26 %
MCH: 27.4 pg (ref 26.0–34.0)
MCHC: 30.6 g/dL (ref 30.0–36.0)
MCV: 89.6 fL (ref 78.0–100.0)
MONO ABS: 0.3 10*3/uL (ref 0.1–1.0)
Monocytes Relative: 5 %
NEUTROS ABS: 4.4 10*3/uL (ref 1.7–7.7)
Neutrophils Relative %: 67 %
Platelets: 213 10*3/uL (ref 150–400)
RBC: 4.12 MIL/uL (ref 3.87–5.11)
RDW: 19 % — AB (ref 11.5–15.5)
WBC: 6.7 10*3/uL (ref 4.0–10.5)

## 2017-09-07 LAB — GLUCOSE, CAPILLARY: Glucose-Capillary: 156 mg/dL — ABNORMAL HIGH (ref 65–99)

## 2017-09-07 LAB — TROPONIN I: Troponin I: 0.06 ng/mL (ref <0.03)

## 2017-09-07 LAB — MAGNESIUM: MAGNESIUM: 2.6 mg/dL — AB (ref 1.7–2.4)

## 2017-09-07 LAB — LIPASE, BLOOD: LIPASE: 21 U/L (ref 11–51)

## 2017-09-07 MED ORDER — CALCIUM ACETATE (PHOS BINDER) 667 MG PO CAPS
1334.0000 mg | ORAL_CAPSULE | Freq: Three times a day (TID) | ORAL | Status: DC
  Administered 2017-09-08 – 2017-09-15 (×13): 1334 mg via ORAL
  Filled 2017-09-07 (×14): qty 2

## 2017-09-07 MED ORDER — OXYCODONE-ACETAMINOPHEN 5-325 MG PO TABS
2.0000 | ORAL_TABLET | Freq: Once | ORAL | Status: AC
  Administered 2017-09-07: 2 via ORAL
  Filled 2017-09-07: qty 2

## 2017-09-07 MED ORDER — PANTOPRAZOLE SODIUM 40 MG PO TBEC: 40.0000 mg | DELAYED_RELEASE_TABLET | Freq: Every day | ORAL | Status: DC | PRN

## 2017-09-07 MED ORDER — OXYCODONE HCL 5 MG PO TABS
5.0000 mg | ORAL_TABLET | ORAL | Status: DC | PRN
  Administered 2017-09-07 – 2017-09-08 (×2): 5 mg via ORAL
  Filled 2017-09-07 (×2): qty 1

## 2017-09-07 MED ORDER — HEPARIN SODIUM (PORCINE) 1000 UNIT/ML DIALYSIS: 1000.0000 [IU] | INTRAMUSCULAR | Status: DC | PRN

## 2017-09-07 MED ORDER — DEXTROSE 5 % IV SOLN: 2.0000 g | INTRAVENOUS | Status: DC

## 2017-09-07 MED ORDER — AMLODIPINE BESYLATE 5 MG PO TABS
5.0000 mg | ORAL_TABLET | Freq: Every day | ORAL | Status: DC
  Administered 2017-09-07 – 2017-09-08 (×2): 5 mg via ORAL
  Filled 2017-09-07: qty 2
  Filled 2017-09-07: qty 1

## 2017-09-07 MED ORDER — SODIUM CHLORIDE 0.9 % IV SOLN: 100.0000 mL | INTRAVENOUS | Status: DC | PRN

## 2017-09-07 MED ORDER — CALCITRIOL 0.5 MCG PO CAPS
1.5000 ug | ORAL_CAPSULE | ORAL | Status: DC
  Administered 2017-09-11 – 2017-09-14 (×2): 1.5 ug via ORAL
  Filled 2017-09-07: qty 3

## 2017-09-07 MED ORDER — DEXTROSE 5 % IV SOLN
2.0000 g | Freq: Once | INTRAVENOUS | Status: AC
  Administered 2017-09-07: 2 g via INTRAVENOUS
  Filled 2017-09-07: qty 2

## 2017-09-07 MED ORDER — VANCOMYCIN HCL IN DEXTROSE 1-5 GM/200ML-% IV SOLN: 1000.0000 mg | INTRAVENOUS | Status: DC

## 2017-09-07 MED ORDER — DIPHENHYDRAMINE HCL 25 MG PO CAPS
25.0000 mg | ORAL_CAPSULE | Freq: Once | ORAL | Status: AC
  Administered 2017-09-08: 25 mg via ORAL
  Filled 2017-09-07: qty 1

## 2017-09-07 MED ORDER — LIDOCAINE HCL (PF) 1 % IJ SOLN: 5.0000 mL | INTRAMUSCULAR | Status: DC | PRN

## 2017-09-07 MED ORDER — INSULIN GLARGINE 100 UNIT/ML ~~LOC~~ SOLN
12.0000 [IU] | Freq: Every day | SUBCUTANEOUS | Status: DC
  Administered 2017-09-07 – 2017-09-09 (×3): 12 [IU] via SUBCUTANEOUS
  Filled 2017-09-07 (×3): qty 0.12

## 2017-09-07 MED ORDER — CINACALCET HCL 30 MG PO TABS
30.0000 mg | ORAL_TABLET | ORAL | Status: DC
  Administered 2017-09-11 – 2017-09-14 (×2): 30 mg via ORAL
  Filled 2017-09-07 (×3): qty 1

## 2017-09-07 MED ORDER — ACETAMINOPHEN 325 MG PO TABS
650.0000 mg | ORAL_TABLET | Freq: Four times a day (QID) | ORAL | Status: DC | PRN
  Filled 2017-09-07 (×2): qty 2

## 2017-09-07 MED ORDER — ACETAMINOPHEN 325 MG PO TABS: 650.0000 mg | ORAL_TABLET | Freq: Four times a day (QID) | ORAL | Status: DC | PRN

## 2017-09-07 MED ORDER — PENTAFLUOROPROP-TETRAFLUOROETH EX AERO: 1.0000 "application " | INHALATION_SPRAY | CUTANEOUS | Status: DC | PRN

## 2017-09-07 MED ORDER — CEFEPIME HCL 1 G IJ SOLR
1.0000 g | Freq: Once | INTRAMUSCULAR | Status: DC
  Filled 2017-09-07: qty 1

## 2017-09-07 MED ORDER — INSULIN ASPART 100 UNIT/ML ~~LOC~~ SOLN
0.0000 [IU] | Freq: Three times a day (TID) | SUBCUTANEOUS | Status: DC
  Administered 2017-09-12: 2 [IU] via SUBCUTANEOUS
  Administered 2017-09-12: 1 [IU] via SUBCUTANEOUS
  Administered 2017-09-13: 3 [IU] via SUBCUTANEOUS
  Administered 2017-09-14: 2 [IU] via SUBCUTANEOUS

## 2017-09-07 MED ORDER — VANCOMYCIN HCL 10 G IV SOLR
2000.0000 mg | Freq: Once | INTRAVENOUS | Status: AC
  Administered 2017-09-07: 2000 mg via INTRAVENOUS
  Filled 2017-09-07: qty 2000

## 2017-09-07 MED ORDER — LIDOCAINE-PRILOCAINE 2.5-2.5 % EX CREA: 1.0000 "application " | TOPICAL_CREAM | CUTANEOUS | Status: DC | PRN

## 2017-09-07 MED ORDER — ALTEPLASE 2 MG IJ SOLR: 2.0000 mg | Freq: Once | INTRAMUSCULAR | Status: DC | PRN

## 2017-09-07 MED ORDER — HEPARIN SODIUM (PORCINE) 1000 UNIT/ML DIALYSIS
20.0000 [IU]/kg | INTRAMUSCULAR | Status: DC | PRN
  Administered 2017-09-07: 2000 [IU] via INTRAVENOUS_CENTRAL
  Filled 2017-09-07: qty 2

## 2017-09-07 MED ORDER — HEPARIN SODIUM (PORCINE) 5000 UNIT/ML IJ SOLN
5000.0000 [IU] | Freq: Three times a day (TID) | INTRAMUSCULAR | Status: DC
  Administered 2017-09-07 – 2017-09-13 (×6): 5000 [IU] via SUBCUTANEOUS
  Filled 2017-09-07 (×9): qty 1

## 2017-09-07 MED ORDER — FENTANYL CITRATE (PF) 100 MCG/2ML IJ SOLN: 50.0000 ug | Freq: Once | INTRAMUSCULAR | Status: DC

## 2017-09-07 MED ORDER — ACETAMINOPHEN 650 MG RE SUPP: 650.0000 mg | Freq: Four times a day (QID) | RECTAL | Status: DC | PRN

## 2017-09-07 MED ORDER — SODIUM CHLORIDE 0.9 % IV SOLN
125.0000 mg | INTRAVENOUS | Status: DC
  Administered 2017-09-07 – 2017-09-11 (×3): 125 mg via INTRAVENOUS
  Filled 2017-09-07 (×7): qty 10

## 2017-09-07 MED ORDER — CALCITRIOL 0.5 MCG PO CAPS
ORAL_CAPSULE | ORAL | Status: AC
  Filled 2017-09-07: qty 3

## 2017-09-07 NOTE — ED Notes (Signed)
Patient transported to X-ray 
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
## 2017-09-07 NOTE — ED Notes (Signed)
Lab and RN unable to obtain 2nd set of cultures, IV team order placed for difficult blood draw and MD made aware. MD advises to start antibiotics prior to 2nd set of cultures.

## 2017-09-07 NOTE — ED Notes (Signed)
IV pump states IV occluded pt side, explained to pt that she is going to have to have a second IV, pt agrees. Will place IV team consult.

## 2017-09-07 NOTE — Progress Notes (Addendum)
Patient lost IV access. IV team called, patient refused to have replacement IV. Hospital protocol explained, and patient agreeable to IV placement "in a few hours". This nurse will place order for IV team  tomorrow AM 09/08/17.   2315, 09/07/17, Venetia Constable PGY1 paged the following at 2350 09/07/17: 6E28: [name], Karen Lemons. Patient with itchy skin. Requesting Benadryl, takes as needed at home. Thank you.  Karen [name] PGY1 paged the following at [place] 09/08/17: 6E28: MRSA and MSSA PCR positive. Isolation in place and treatment protocol initiated.    0757, 09/08/17: Patient refused, again, for peripheral IV placement.

## 2017-09-07 NOTE — Progress Notes (Signed)
Pharmacy Antibiotic Note  Karen [name] is a 41 y.o. female on HD TTS admitted on 09/07/2017 with pneumonia.  Patient came from dialysis center with chest pain, coughing up blood for 2 days. She did not get dialyzed today, last session was Thurs 12/13. Pharmacy has been consulted for vancomycin and cefepime dosing.   Plan: Vancomycin 2000 mg IV load x 1, followed by 1000 mg with each HD session. Goal trough 15 - 20. Cefepime 2 gram IV x 1, then post-HD. Follow dialysis plans, culture results Monitor clinical status, LOT/de-escalation plans  Height: 5\' 6"  (167.6 cm) Weight: 215 lb (97.5 kg) IBW/kg (Calculated) : 59.3  Temp (24hrs), Avg:98.4 F (36.9 C), Min:98.4 F (36.9 C), Max:98.4 F (36.9 C)  Recent Labs  Lab 09/07/17 0802  WBC 6.7    CrCl cannot be calculated (Patient's most recent lab result is older than the maximum 21 days allowed.).    Allergies  Allergen Reactions  . Penicillins Anaphylaxis, Hives, Rash and Other (See Comments)    PATIENT HAD A PCN REACTION WITH IMMEDIATE RASH, FACIAL/TONGUE/THROAT SWELLING, SOB, OR LIGHTHEADEDNESS WITH HYPOTENSION:  #  #  #  YES  #  #  #   SEVERE RASH INVOLVING MUCUS MEMBRANES or SKIN NECROSIS: #  #  #  YES  #  #  # Has patient had a PCN reaction that required hospitalization No Has patient had a PCN reaction occurring within the last 10 years: No If all of the above answers are "NO", then may proceed with Cephalosporin use.  09/10/16- tolerated Cefepime   . Morphine And Related Hives and Rash  . Novolog [Insulin Aspart] Other (See Comments)    Cramps/ Gi distress    Antimicrobials this admission: Vanc 12/18 >>  Cefepime 12/18 >>    Microbiology results: 12/18 BCx: pending  Thank you for allowing pharmacy to be a part of this patient's care.  Charlton Haws, PharmD PGY1 Pharmacy Resident Main pharmacy: [phone] 09/07/17 9:39 AM
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
## 2017-09-07 NOTE — ED Notes (Signed)
Attempted to obtain urine sample from PT, attempt was unsuccessful. Pt stated " she forgot what she was supposed to do."
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
## 2017-09-07 NOTE — ED Notes (Signed)
Pt taken to RR by NT in wheelchair, pt refusing to allow staff to stay in RR with her. Staff waiting outside of door.
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
## 2017-09-07 NOTE — ED Notes (Addendum)
Pt refusing to be stuck for 2nd set of cultures and lactic or 2nd IV MD made aware

## 2017-09-07 NOTE — ED Notes (Signed)
Spoke with admitting about pt not having IV, he reports he will be DC vanc.

## 2017-09-07 NOTE — ED Notes (Signed)
Dr. Justin Mend w/ Nephrology paged to Dr. Ellender Hose @ [phone] to [phone].

## 2017-09-07 NOTE — ED Notes (Signed)
IV Team at bedside attempting to obtain 2nd IV site and blood draw

## 2017-09-07 NOTE — ED Triage Notes (Signed)
Per report from Raynelle Bring ,RN the Adm . Md was called and informed the Pt has no Iv access and IV team is unable access an IV site. Per Bernita Raisin the adm MD wanted  The Renal MD to give order to use dialysis graft for Anti-Bx infusion when admitted.

## 2017-09-07 NOTE — ED Notes (Signed)
IV team unable to obtain IV. IV team renal will need to place order for access through dialysis site.

## 2017-09-07 NOTE — ED Notes (Signed)
IV team at bedside 

## 2017-09-07 NOTE — ED Notes (Signed)
Admitting MD paged to [facility] @ [phone] to [phone].

## 2017-09-07 NOTE — ED Notes (Signed)
Patient transported to CT 
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
## 2017-09-08 DIAGNOSIS — E669 Obesity, unspecified: Secondary | ICD-10-CM

## 2017-09-08 DIAGNOSIS — E1140 Type 2 diabetes mellitus with diabetic neuropathy, unspecified: Secondary | ICD-10-CM

## 2017-09-08 DIAGNOSIS — E1151 Type 2 diabetes mellitus with diabetic peripheral angiopathy without gangrene: Secondary | ICD-10-CM

## 2017-09-08 DIAGNOSIS — J9811 Atelectasis: Secondary | ICD-10-CM

## 2017-09-08 DIAGNOSIS — R079 Chest pain, unspecified: Secondary | ICD-10-CM

## 2017-09-08 LAB — GLUCOSE, CAPILLARY
Glucose-Capillary: 92 mg/dL (ref 65–99)
Glucose-Capillary: 97 mg/dL (ref 65–99)
Glucose-Capillary: 127 mg/dL — ABNORMAL HIGH (ref 65–99)
Glucose-Capillary: 93 mg/dL (ref 65–99)

## 2017-09-08 LAB — SURGICAL PCR SCREEN
MRSA, PCR: POSITIVE — AB
Staphylococcus aureus: POSITIVE — AB

## 2017-09-08 LAB — HIV ANTIBODY (ROUTINE TESTING W REFLEX): HIV SCREEN 4TH GENERATION: NONREACTIVE

## 2017-09-08 MED ORDER — DICLOFENAC SODIUM 1 % TD GEL
4.0000 g | Freq: Four times a day (QID) | TRANSDERMAL | Status: DC | PRN
  Filled 2017-09-08: qty 100

## 2017-09-08 MED ORDER — ONDANSETRON HCL 4 MG/2ML IJ SOLN
4.0000 mg | Freq: Three times a day (TID) | INTRAMUSCULAR | Status: DC | PRN
  Filled 2017-09-08: qty 2

## 2017-09-08 MED ORDER — RENA-VITE PO TABS
1.0000 | ORAL_TABLET | Freq: Every day | ORAL | Status: DC
  Administered 2017-09-09 – 2017-09-14 (×4): 1 via ORAL
  Filled 2017-09-08 (×6): qty 1

## 2017-09-08 MED ORDER — ONDANSETRON 4 MG PO TBDP
4.0000 mg | ORAL_TABLET | Freq: Three times a day (TID) | ORAL | Status: DC | PRN
  Administered 2017-09-08: 4 mg via ORAL
  Filled 2017-09-08 (×2): qty 1

## 2017-09-08 MED ORDER — MUPIROCIN 2 % EX OINT
1.0000 "application " | TOPICAL_OINTMENT | Freq: Two times a day (BID) | CUTANEOUS | Status: AC
  Administered 2017-09-08 – 2017-09-12 (×7): 1 via NASAL
  Filled 2017-09-08: qty 22

## 2017-09-08 MED ORDER — CHLORHEXIDINE GLUCONATE CLOTH 2 % EX PADS
6.0000 | MEDICATED_PAD | Freq: Every morning | CUTANEOUS | Status: AC
  Administered 2017-09-08 – 2017-09-12 (×3): 6 via TOPICAL

## 2017-09-08 MED ORDER — AMLODIPINE BESYLATE 5 MG PO TABS
5.0000 mg | ORAL_TABLET | Freq: Every day | ORAL | Status: DC
  Administered 2017-09-09: 5 mg via ORAL
  Filled 2017-09-08 (×2): qty 1
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
## 2017-09-08 NOTE — Progress Notes (Signed)
VASCULAR SURGERY:  Vein map of the left upper extremity is pending.  She will be scheduled for a left AV fistula versus a right AV graft on Friday.  She has a functioning catheter.  Deitra Mayo, MD, [place] [phone] Office: [phone]

## 2017-09-08 NOTE — Progress Notes (Signed)
   Subjective: Patient had vomited prior to arrival, was non bloody. Said she was feeling nauseous, got restless, and was unable to get comfortable in bed and then vomited. She felt better after emesis. Stated the CP had improved. Still having mildly productive cough.   Objective:  Vital signs in last 24 hours: Vitals:   09/07/17 1909 09/07/17 1950 09/08/17 0359 09/08/17 0400  BP: (!) 157/85 (!) 164/95  135/88  Pulse: 93 87  92  Resp: 18   18  Temp: 98.4 F (36.9 C) 98.2 F (36.8 C)  98.2 F (36.8 C)  TempSrc: Oral Oral    SpO2: 98% 99%  98%  Weight:  212 lb 15.4 oz (96.6 kg) 213 lb 3 oz (96.7 kg)   Height:  5\' 6"  (1.676 m)     General: Sitting on side of bed, vomitus on gown and floor, NAD HEENT: [place]/AT, no scleral icterus,  Chest wall: no tenderness to palpation, pain not reproducible Cardiac: RRR, No R/M/G appreciated Pulm: normal effort, CTAB Abd: soft, non tender, non distended, BS normal Ext: extremities well perfused, no peripheral edema Neuro: alert and oriented X3, cranial nerves II-XII grossly intact Skin: multiple hyperpigmented nodules on patient's entire body   Assessment/Plan:  Active Problems:   Chest pain  Chest pain RLL lung opacity/pleural effusion Chest pain improved. Low suspicion for pneumonia at this time as patient is afebrile without leukocytosis. Still having cough. Pain seems pleuritic in nature may be multifactorial and related to trauma, pleural effusion, and volume overload after missing HD.  Patient vomited and there were no signs of blood in vomit. She was drowsy and unable to obtain too much of a history regarding bloody hemoptysis. -No antibiotics at this time -Will clarify history of hemoptysis -Volatren gel for chest wall pain -not requireing oxy IR per med history, will d/c at this time -PRN tylenol for pain and voltaren gel as above -Will continue to monitor for symptoms of pneumonia   ESRD on HD S/p 2 hour dialysis on 12/18. BP  improved from admission, but still elevated. Will need more fluid off per nephro and will return to TTS schedule. Vascular consulted for AVF or graft placement as patient is still getting HD from TD site.  -Nephro managing HD, appreciate recs -Vein mapping pending, AVF or graft placement scheduled for Friday  T2DM Most recent A1C in 09/2017 was 8.1. Patient on glargine 18U and correction insulin TIDAC at home. BG meeting inpatient goals. -Continue Lantus 12 units qhs -SSI -CBG monitoring   Hypertension Improved after HD. Remains elevated 163/103. -Continue Amlodipine 5mg  daily, may need to increase  -will monitor for improvement after HD   Dispo: Anticipated discharge in approximately 3-4 day(s).   Melanee Spry, MD 09/08/2017, 8:56 AM Pager: [phone]

## 2017-09-08 NOTE — Progress Notes (Signed)
[facility] Progress Note   Dialysis Orders: TTS Ash 4 hr 2 K 2.25 Ca 400/800 EDW 94 2 heparin 6000 to 2 K mid tmt right IJ Mircera 200 last 12/13 Fe q HD through 12/25 sensipar q HD calcitriol 1.25  Assessment/Plan: 1. Chest pain  - trop 0.06; RLL lung opacity/pleural effusion- on Maxepime/Vanc - not convincing for PNA but having some cough- afebrile WBC normal 2. ESRD - TTS K 5.5 12/18- VVS consulted for permanent access while here since she has not been following through with outpatient appointments; access placement planned for Friday; HD again on schedule  3. Anemia -  hgb 11.3- not due for ESA until next week - continue q HD Fe 4. Secondary hyperparathyroidism - Ca 8.8 - continue sensipar TTS, calcitriol q HD and binders 5. HTN/volume - net UF 1.7 12/18 with post wt 96.6; mild right pleural effusion on chest CT; titrate EDW while here for better BP control; only on 5 norvasc- may need higher dose if she doesn't get volume down and BP remains up - will also change amlodipine dosing to HS starting today since she vomited this am and not clear how much of am dose she absorbed. May need to go up to 10 mg in the future.  Outpatient records she last refilled coreg 11/2016 6. Nutrition - alb 3.7 renal carb mod - add multivitamin  Myriam Jacobson, PA-C [facility] Beeper [phone] 09/08/2017,9:01 AM  LOS: 1 day   Subjective:   Vomited this am. Cramped on HD yesterday - cramps at outpatient unit and doesn't get to EDW.   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 Dialysis Access: right IJ [facility]   Additional Objective Labs: Basic Metabolic Panel: Recent Labs  Lab 09/07/17 0802  NA 136  K 5.5*  CL 101  CO2 20*  GLUCOSE 151*  BUN 73*  CREATININE 13.19*  CALCIUM 8.8*   Liver Function Tests: Recent Labs  Lab 09/07/17 0802  AST 14*  ALT 11*  ALKPHOS 74  BILITOT 0.8  PROT 8.1  ALBUMIN 3.7   Recent Labs  Lab 09/07/17 0802  LIPASE 21   CBC: Recent Labs  Lab 09/07/17 0802  WBC 6.7  NEUTROABS 4.4  HGB 11.3*  HCT 36.9  MCV 89.6  PLT 213   Cardiac Enzymes: Recent Labs  Lab 09/07/17 0802  TROPONINI 0.06*   CBG: Recent Labs  Lab 09/07/17 2016 09/08/17 0819  GLUCAP 156* 92    Lab Results  Component Value Date   INR 1.15 02/08/2013   Studies/Results: Dg Chest 2 View  Result Date: 09/07/2017 CLINICAL DATA:  Chest pain. EXAM: CHEST  2 VIEW COMPARISON:  Radiograph of September 06, 2017. FINDINGS: Stable cardiomegaly. No pneumothorax is noted. Right internal jugular dialysis catheter is unchanged in position. Stable right basilar opacity is  noted which may represent inflammation or atelectasis. Left lung is unremarkable. Bony thorax is unremarkable. IMPRESSION: Stable right basilar opacity as described above. Stable cardiomegaly. Electronically Signed   By: Marijo Conception, M.D.   On: 09/07/2017 08:59   Ct Chest Wo Contrast  Result Date: 09/07/2017 CLINICAL DATA:  Chest pain, shortness of breath, cough. EXAM: CT CHEST WITHOUT CONTRAST TECHNIQUE: Multidetector CT imaging of the chest was performed following the standard protocol without IV contrast. COMPARISON:  Radiographs of same day.  CT scan of June 21, 2013. FINDINGS: Cardiovascular: There is no evidence of thoracic aortic aneurysm. Mild cardiomegaly is noted. No pericardial effusion is noted. Right internal jugular dialysis catheter is noted with distal tip near tricuspid  valve. Mediastinum/Nodes: No enlarged mediastinal or axillary lymph nodes. Thyroid gland, trachea, and esophagus demonstrate no significant findings. Lungs/Pleura: No pneumothorax is noted. Left lung is clear. Mild right pleural effusion is noted with adjacent subsegmental atelectasis. Upper Abdomen: No acute abnormality. Musculoskeletal: No chest wall mass or suspicious bone lesions identified. IMPRESSION: Mild right pleural effusion is noted with adjacent subsegmental atelectasis of the right lower lobe. Right internal jugular dialysis catheter is noted with distal tip near tricuspid valve. Electronically Signed   By: Marijo Conception, M.D.   On: 09/07/2017 10:25   Medications: . [START ON 09/09/2017] ceFEPime (MAXIPIME) IV    . ferric gluconate (FERRLECIT/NULECIT) IV Stopped (09/07/17 2006)  . [START ON 09/09/2017] vancomycin     . amLODipine  5 mg Oral Daily  . calcitRIOL  1.5 mcg Oral Q T,Th,Sa-HD  . calcium acetate  1,334 mg Oral TID WC  . Chlorhexidine Gluconate Cloth  6 each Topical q morning - 10a  . [START ON 09/09/2017] cinacalcet  30 mg Oral Q T,Th,Sa-HD  . heparin  5,000 Units Subcutaneous Q8H  . insulin aspart  0-9 Units Subcutaneous TID WC  . insulin glargine  12 Units Subcutaneous QHS  . mupirocin ointment  1 application Nasal BID

## 2017-09-09 ENCOUNTER — Inpatient Hospital Stay (HOSPITAL_COMMUNITY): Payer: Medicare Other

## 2017-09-09 DIAGNOSIS — E669 Obesity, unspecified: Secondary | ICD-10-CM | POA: Diagnosis present

## 2017-09-09 DIAGNOSIS — Z01810 Encounter for preprocedural cardiovascular examination: Secondary | ICD-10-CM

## 2017-09-09 DIAGNOSIS — M7918 Myalgia, other site: Secondary | ICD-10-CM

## 2017-09-09 LAB — GLUCOSE, CAPILLARY
Glucose-Capillary: 69 mg/dL (ref 65–99)
Glucose-Capillary: 114 mg/dL — ABNORMAL HIGH (ref 65–99)
Glucose-Capillary: 91 mg/dL (ref 65–99)
Glucose-Capillary: 75 mg/dL (ref 65–99)
Glucose-Capillary: 122 mg/dL — ABNORMAL HIGH (ref 65–99)
Glucose-Capillary: 122 mg/dL — ABNORMAL HIGH (ref 65–99)

## 2017-09-09 LAB — BASIC METABOLIC PANEL
Anion gap: 12 (ref 5–15)
BUN: 53 mg/dL — ABNORMAL HIGH (ref 6–20)
CO2: 22 mmol/L (ref 22–32)
Calcium: 8.5 mg/dL — ABNORMAL LOW (ref 8.9–10.3)
Chloride: 100 mmol/L — ABNORMAL LOW (ref 101–111)
Creatinine, Ser: 11.07 mg/dL — ABNORMAL HIGH (ref 0.44–1.00)
GFR calc Af Amer: 4 mL/min — ABNORMAL LOW (ref >60)
GFR calc non Af Amer: 4 mL/min — ABNORMAL LOW (ref >60)
Glucose, Bld: 88 mg/dL (ref 65–99)
Potassium: 5.4 mmol/L — ABNORMAL HIGH (ref 3.5–5.1)
Sodium: 134 mmol/L — ABNORMAL LOW (ref 135–145)

## 2017-09-09 LAB — CBC
HCT: 36.6 % (ref 36.0–46.0)
Hemoglobin: 11.2 g/dL — ABNORMAL LOW (ref 12.0–15.0)
MCH: 27.4 pg (ref 26.0–34.0)
MCHC: 30.6 g/dL (ref 30.0–36.0)
MCV: 89.5 fL (ref 78.0–100.0)
Platelets: 172 10*3/uL (ref 150–400)
RBC: 4.09 MIL/uL (ref 3.87–5.11)
RDW: 19.1 % — ABNORMAL HIGH (ref 11.5–15.5)
WBC: 5 10*3/uL (ref 4.0–10.5)

## 2017-09-09 MED ORDER — SODIUM CHLORIDE 0.9 % IV SOLN: 100.0000 mL | INTRAVENOUS | Status: DC | PRN

## 2017-09-09 MED ORDER — DIPHENHYDRAMINE HCL 25 MG PO CAPS
25.0000 mg | ORAL_CAPSULE | Freq: Four times a day (QID) | ORAL | Status: DC | PRN
  Filled 2017-09-09: qty 1

## 2017-09-09 MED ORDER — HEPARIN SODIUM (PORCINE) 1000 UNIT/ML DIALYSIS
100.0000 [IU]/kg | INTRAMUSCULAR | Status: DC | PRN
  Filled 2017-09-09: qty 6

## 2017-09-09 MED ORDER — LIDOCAINE-PRILOCAINE 2.5-2.5 % EX CREA
1.0000 "application " | TOPICAL_CREAM | CUTANEOUS | Status: DC | PRN
  Filled 2017-09-09: qty 5

## 2017-09-09 MED ORDER — LIDOCAINE HCL (PF) 1 % IJ SOLN: 5.0000 mL | INTRAMUSCULAR | Status: DC | PRN

## 2017-09-09 MED ORDER — PENTAFLUOROPROP-TETRAFLUOROETH EX AERO
1.0000 | INHALATION_SPRAY | CUTANEOUS | Status: DC | PRN
Start: 2017-09-09 — End: 2017-09-09

## 2017-09-09 MED ORDER — ALTEPLASE 2 MG IJ SOLR: 2.0000 mg | Freq: Once | INTRAMUSCULAR | Status: DC | PRN

## 2017-09-09 MED ORDER — HEPARIN SODIUM (PORCINE) 1000 UNIT/ML DIALYSIS
1000.0000 [IU] | INTRAMUSCULAR | Status: DC | PRN
  Filled 2017-09-09: qty 1

## 2017-09-09 NOTE — Procedures (Signed)
I was present at this dialysis session. I have reviewed the session itself and made appropriate changes.   For AV access tomorrow.  2K bath. Inc UF goal to 3L, probe for lower post HD weights.  Opp working well Qb 400.  Hb ok.   Filed Weights   09/07/17 1950 09/08/17 0359 09/09/17 0501  Weight: 96.6 kg (212 lb 15.4 oz) 96.7 kg (213 lb 3 oz) 97.8 kg (215 lb 9.8 oz)    Recent Labs  Lab 09/09/17 0710  NA 134*  K 5.4*  CL 100*  CO2 22  GLUCOSE 88  BUN 53*  CREATININE 11.07*  CALCIUM 8.5*    Recent Labs  Lab 09/07/17 0802 09/09/17 0710  WBC 6.7 5.0  NEUTROABS 4.4  --   HGB 11.3* 11.2*  HCT 36.9 36.6  MCV 89.6 89.5  PLT 213 172    Scheduled Meds: . amLODipine  5 mg Oral QHS  . calcitRIOL  1.5 mcg Oral Q T,Th,Sa-HD  . calcium acetate  1,334 mg Oral TID WC  . Chlorhexidine Gluconate Cloth  6 each Topical q morning - 10a  . cinacalcet  30 mg Oral Q T,Th,Sa-HD  . heparin  5,000 Units Subcutaneous Q8H  . insulin aspart  0-9 Units Subcutaneous TID WC  . insulin glargine  12 Units Subcutaneous QHS  . multivitamin  1 tablet Oral QHS  . mupirocin ointment  1 application Nasal BID   Continuous Infusions: . sodium chloride    . sodium chloride    . ferric gluconate (FERRLECIT/NULECIT) IV Stopped (09/07/17 2006)   PRN Meds:.sodium chloride, sodium chloride, acetaminophen **OR** acetaminophen, alteplase, diclofenac sodium, heparin, heparin, lidocaine (PF), lidocaine-prilocaine, ondansetron, pentafluoroprop-tetrafluoroeth   Pearson Grippe  MD 09/09/2017, 8:53 AM

## 2017-09-09 NOTE — Progress Notes (Signed)
Bilateral Upper Extremity Vein Map  Right Cephalic Segment Diameter Depth Comment  Axilla 2.8 mm  18.4 mm    1. Prox 3.0 mm 16.2 mm   2. Mid upper arm 3.1 mm 15.0 mm branches  3. Above AC 0.9 mm 13.2 mm branches  4. In AC mm mm Not visualized  5. Below AC 2.9 mm 7.6 mm   6. Mid forearm 2.3 mm 9.5 mm   7. Wrist 2.7 mm 2.9 mm    Right Basilic Segment Diameter Depth Comment  Axilla mm mm Not visualized  1. Prox mm mm Not visualized  2. Mid upper arm 2.1 mm 15.0 mm   3. Above AC 1.8 mm 22.3 mm   4. In AC 2.1 mm 14.4 mm   5. Below AC 0.1 mm 3.7 mm   6. Mid forearm mm mm Not visualized  7. Wrist mm mm Not visualized   Left Cephalic Segment Diameter Depth Comment  Axilla 1.5 mm  18.6 mm    1. Prox 1.5 mm 17.10 mm   2. Mid upper arm 1.8 mm 15.0 mm   3. Above AC 1.2 mm 10.5 mm   4. In AC 0.6 mm 6.1 mm   5. Below AC 2.7 mm 5.7 mm   6. Mid forearm 2.3 mm 6.0 mm branches  7. Wrist mm mm Not visualized   Left Basilic Segment Diameter Depth Comment  Axilla   Not visualized  1. Prox mm mm Not visualized  2. Mid upper arm 3.3 mm 17.7 mm   3. Above AC 3.9 mm 14.6 mm   4. In AC 2.0 mm 6.5 mm   5. Below AC 2.4 mm 5.1 mm   6. Mid forearm 1.4 mm 3.1 mm   7. Wrist mm mm Not visualized   Lita Mains- RDMS, RVT 4:50 PM  09/09/2017

## 2017-09-09 NOTE — Progress Notes (Signed)
[facility] Progress Note   Dialysis Orders: TTS Ash 4 hr 2 K 2.25 Ca 400/800 EDW 94 2 heparin 6000 to 2 K mid tmt right IJ Mircera 200 last 12/13 Fe q HD through 12/25 sensipar q HD calcitriol 1.25  Assessment/Plan: 1. Chest pain  - trop 0.06; RLL lung opacity/pleural effusion- on Maxepime/Vanc - not convincing for PNA but having some cough- afebrile WBC normal 2. ESRD - TTS K 5.54- VVS consulted for permanent access while here since she has not been following through with outpatient appointments; access placement planned for Friday; next HD Saturday if not discharged Friday 3. Anemia -  hgb 11.2 stable- not due for ESA until next week - continue q HD Fe 4. Secondary hyperparathyroidism - Ca 8.5 - continue sensipar TTS, calcitriol q HD and binders 5. HTN/volume - BP too high  Goal 3.5 today; net UF 1.7 12/18 with post wt 96.6; mild right pleural effusion on chest CT; titrate EDW while here for better BP control; changed amlodipine dosing to HS starting 12/19. May need to go up to 10 mg in the future if she does not tolerate UF.  Outpatient records she last refilled coreg 11/2016- addition of this is an option; goal today  6. Nutrition - alb 3.7 renal carb mod - add multivitamin  Myriam Jacobson, PA-C [place] [phone] 09/09/2017,8:56 AM  LOS: 2 days   Subjective:   Felt too bad to stand for weight. Normally wears a prosthesis when she goes to dialysis.  Objective Vitals:   09/08/17 1500 09/08/17 2152 09/08/17 2300 09/09/17 0501  BP: (!) 192/109 (!) 161/108 (!) 154/98 (!) 131/101  Pulse:  98  95  Resp: (!) 24 14  19   Temp: 98.4 F (36.9 C) 98.2 F (36.8 C)  98.7 F (37.1 C)  TempSrc: Oral Oral  Oral  SpO2:  95%  94%  Weight:    97.8 kg (215 lb 9.8 oz)  Height:       Physical Exam General: morbidly obese on HD  Heart: RRR Lungs: soft exp wheezes Abdomen: obese soft NT Extremities: left BKA no edema right LE no edema Dialysis Access:  right IJ [facility]   Additional Objective Labs: Basic Metabolic Panel: Recent Labs  Lab 09/07/17 0802 09/09/17 0710  NA 136 134*  K 5.5* 5.4*  CL 101 100*  CO2 20* 22  GLUCOSE 151* 88  BUN 73* 53*  CREATININE 13.19* 11.07*  CALCIUM 8.8* 8.5*   Liver Function Tests: Recent Labs  Lab 09/07/17 0802  AST 14*  ALT 11*  ALKPHOS 74  BILITOT 0.8  PROT 8.1  ALBUMIN 3.7   Recent Labs  Lab 09/07/17 0802  LIPASE 21   CBC: Recent Labs  Lab 09/07/17 0802 09/09/17 0710  WBC 6.7 5.0  NEUTROABS 4.4  --   HGB 11.3* 11.2*  HCT 36.9 36.6  MCV 89.6 89.5  PLT 213 172   Blood Culture    Component Value Date/Time   SDES BLOOD RIGHT ANTECUBITAL 09/07/2017 1830   SPECREQUEST IN PEDIATRIC BOTTLE Blood Culture adequate volume 09/07/2017 1830   CULT NO GROWTH < 24 HOURS 09/07/2017 1830   REPTSTATUS PENDING 09/07/2017 1830    Cardiac Enzymes: Recent Labs  Lab 09/07/17 0802  TROPONINI 0.06*   CBG: Recent Labs  Lab 09/08/17 0819 09/08/17 1210 09/08/17 1655 09/08/17 2124 09/09/17 0755  GLUCAP 92 97 127* 93 69   Iron Studies: No results for input(s): IRON, TIBC, TRANSFERRIN, FERRITIN in the  last 72 hours. Lab Results  Component Value Date   INR 1.15 02/08/2013   Studies/Results: Ct Chest Wo Contrast  Result Date: 09/07/2017 CLINICAL DATA:  Chest pain, shortness of breath, cough. EXAM: CT CHEST WITHOUT CONTRAST TECHNIQUE: Multidetector CT imaging of the chest was performed following the standard protocol without IV contrast. COMPARISON:  Radiographs of same day.  CT scan of June 21, 2013. FINDINGS: Cardiovascular: There is no evidence of thoracic aortic aneurysm. Mild cardiomegaly is noted. No pericardial effusion is noted. Right internal jugular dialysis catheter is noted with distal tip near tricuspid valve. Mediastinum/Nodes: No enlarged mediastinal or axillary lymph nodes. Thyroid gland, trachea, and esophagus demonstrate no significant findings. Lungs/Pleura: No  pneumothorax is noted. Left lung is clear. Mild right pleural effusion is noted with adjacent subsegmental atelectasis. Upper Abdomen: No acute abnormality. Musculoskeletal: No chest wall mass or suspicious bone lesions identified. IMPRESSION: Mild right pleural effusion is noted with adjacent subsegmental atelectasis of the right lower lobe. Right internal jugular dialysis catheter is noted with distal tip near tricuspid valve. Electronically Signed   By: Marijo Conception, M.D.   On: 09/07/2017 10:25   Medications: . sodium chloride    . sodium chloride    . ferric gluconate (FERRLECIT/NULECIT) IV Stopped (09/07/17 2006)   . amLODipine  5 mg Oral QHS  . calcitRIOL  1.5 mcg Oral Q T,Th,Sa-HD  . calcium acetate  1,334 mg Oral TID WC  . Chlorhexidine Gluconate Cloth  6 each Topical q morning - 10a  . cinacalcet  30 mg Oral Q T,Th,Sa-HD  . heparin  5,000 Units Subcutaneous Q8H  . insulin aspart  0-9 Units Subcutaneous TID WC  . insulin glargine  12 Units Subcutaneous QHS  . multivitamin  1 tablet Oral QHS  . mupirocin ointment  1 application Nasal BID
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
## 2017-09-09 NOTE — Progress Notes (Signed)
  Date: 09/09/2017  Patient name: Karen [name]  Medical record number: [id number]  Date of birth: 05-02-1976   I have seen and evaluated this patient and I have discussed the plan of care with the house staff. Please see their note for complete details. I concur with their findings with the following additions/corrections:   Chest pain has largely resolved with some residual musculoskeletal pain in her right side and flank. Appreciate vascular surgery seen her. They're planning vein mapping today and fistula or graft tomorrow.  Oda Kilts, MD 09/09/2017, 3:59 PM

## 2017-09-09 NOTE — H&P (View-Only) (Signed)
   VASCULAR SURGERY ASSESSMENT & PLAN:   Vein map is still pending.  The patient is scheduled for a left arm AV fistula or AV graft tomorrow. I have discussed the procedure and potential complications with the patient and she is agreeable to proceed.  SUBJECTIVE:   No complaints.  PHYSICAL EXAM:   Vitals:   09/09/17 1030 09/09/17 1100 09/09/17 1119 09/09/17 1326  BP: (!) 188/113 (!) 162/94 137/85 (!) 167/114  Pulse: 91 87 88 87  Resp:   18 18  Temp:   98.2 F (36.8 C) (!) 100.4 F (38 C)  TempSrc:   Oral Oral  SpO2:   97% 96%  Weight:   210 lb 5.1 oz (95.4 kg)   Height:       Palpable left radial pulse.  LABS:   Lab Results  Component Value Date   WBC 5.0 09/09/2017   HGB 11.2 (L) 09/09/2017   HCT 36.6 09/09/2017   MCV 89.5 09/09/2017   PLT 172 09/09/2017   Lab Results  Component Value Date   CREATININE 11.07 (H) 09/09/2017   Lab Results  Component Value Date   INR 1.15 02/08/2013   CBG (last 3)  Recent Labs    09/09/17 0755 09/09/17 1103 09/09/17 1214  GLUCAP 69 114* 91    PROBLEM LIST:    Principal Problem:   Chest pain Active Problems:   Essential hypertension   End-stage renal disease on hemodialysis (HCC)   CURRENT MEDS:   . amLODipine  5 mg Oral QHS  . calcitRIOL  1.5 mcg Oral Q T,Th,Sa-HD  . calcium acetate  1,334 mg Oral TID WC  . Chlorhexidine Gluconate Cloth  6 each Topical q morning - 10a  . cinacalcet  30 mg Oral Q T,Th,Sa-HD  . heparin  5,000 Units Subcutaneous Q8H  . insulin aspart  0-9 Units Subcutaneous TID WC  . insulin glargine  12 Units Subcutaneous QHS  . multivitamin  1 tablet Oral QHS  . mupirocin ointment  1 application Nasal BID    Deitra Mayo Beeper: [phone] Office: [phone] 09/09/2017

## 2017-09-09 NOTE — Progress Notes (Signed)
   VASCULAR SURGERY ASSESSMENT & PLAN:   Vein map is still pending.  The patient is scheduled for a left arm AV fistula or AV graft tomorrow. I have discussed the procedure and potential complications with the patient and she is agreeable to proceed.  SUBJECTIVE:   No complaints.  PHYSICAL EXAM:   Vitals:   09/09/17 1030 09/09/17 1100 09/09/17 1119 09/09/17 1326  BP: (!) 188/113 (!) 162/94 137/85 (!) 167/114  Pulse: 91 87 88 87  Resp:   18 18  Temp:   98.2 F (36.8 C) (!) 100.4 F (38 C)  TempSrc:   Oral Oral  SpO2:   97% 96%  Weight:   210 lb 5.1 oz (95.4 kg)   Height:       Palpable left radial pulse.  LABS:   Lab Results  Component Value Date   WBC 5.0 09/09/2017   HGB 11.2 (L) 09/09/2017   HCT 36.6 09/09/2017   MCV 89.5 09/09/2017   PLT 172 09/09/2017   Lab Results  Component Value Date   CREATININE 11.07 (H) 09/09/2017   Lab Results  Component Value Date   INR 1.15 02/08/2013   CBG (last 3)  Recent Labs    09/09/17 0755 09/09/17 1103 09/09/17 1214  GLUCAP 69 114* 91    PROBLEM LIST:    Principal Problem:   Chest pain Active Problems:   Essential hypertension   End-stage renal disease on hemodialysis (HCC)   CURRENT MEDS:   . amLODipine  5 mg Oral QHS  . calcitRIOL  1.5 mcg Oral Q T,Th,Sa-HD  . calcium acetate  1,334 mg Oral TID WC  . Chlorhexidine Gluconate Cloth  6 each Topical q morning - 10a  . cinacalcet  30 mg Oral Q T,Th,Sa-HD  . heparin  5,000 Units Subcutaneous Q8H  . insulin aspart  0-9 Units Subcutaneous TID WC  . insulin glargine  12 Units Subcutaneous QHS  . multivitamin  1 tablet Oral QHS  . mupirocin ointment  1 application Nasal BID    Deitra Mayo Beeper: [phone] Office: [phone] 09/09/2017

## 2017-09-10 ENCOUNTER — Encounter (HOSPITAL_COMMUNITY): Payer: Self-pay | Admitting: *Deleted

## 2017-09-10 ENCOUNTER — Inpatient Hospital Stay (HOSPITAL_COMMUNITY): Payer: Medicare Other | Admitting: Anesthesiology

## 2017-09-10 ENCOUNTER — Encounter (HOSPITAL_COMMUNITY)
Admission: EM | Disposition: A | Discharge status: Skilled Nursing Facility | Payer: Self-pay | Source: Home / Self Care | Attending: Internal Medicine

## 2017-09-10 DIAGNOSIS — N185 Chronic kidney disease, stage 5: Secondary | ICD-10-CM

## 2017-09-10 DIAGNOSIS — J90 Pleural effusion, not elsewhere classified: Secondary | ICD-10-CM

## 2017-09-10 DIAGNOSIS — M549 Dorsalgia, unspecified: Secondary | ICD-10-CM

## 2017-09-10 DIAGNOSIS — E11649 Type 2 diabetes mellitus with hypoglycemia without coma: Secondary | ICD-10-CM

## 2017-09-10 HISTORY — PX: AV FISTULA PLACEMENT: SHX1204

## 2017-09-10 LAB — CBC
HCT: 37.7 % (ref 36.0–46.0)
Hemoglobin: 11.6 g/dL — ABNORMAL LOW (ref 12.0–15.0)
MCH: 27.8 pg (ref 26.0–34.0)
MCHC: 30.8 g/dL (ref 30.0–36.0)
MCV: 90.4 fL (ref 78.0–100.0)
Platelets: 129 10*3/uL — ABNORMAL LOW (ref 150–400)
RBC: 4.17 MIL/uL (ref 3.87–5.11)
RDW: 19.4 % — ABNORMAL HIGH (ref 11.5–15.5)
WBC: 4.9 10*3/uL (ref 4.0–10.5)

## 2017-09-10 LAB — RENAL FUNCTION PANEL
Albumin: 3.2 g/dL — ABNORMAL LOW (ref 3.5–5.0)
Anion gap: 11 (ref 5–15)
BUN: 25 mg/dL — ABNORMAL HIGH (ref 6–20)
CO2: 26 mmol/L (ref 22–32)
Calcium: 8.5 mg/dL — ABNORMAL LOW (ref 8.9–10.3)
Chloride: 97 mmol/L — ABNORMAL LOW (ref 101–111)
Creatinine, Ser: 7.68 mg/dL — ABNORMAL HIGH (ref 0.44–1.00)
GFR calc Af Amer: 7 mL/min — ABNORMAL LOW (ref >60)
GFR calc non Af Amer: 6 mL/min — ABNORMAL LOW (ref >60)
Glucose, Bld: 64 mg/dL — ABNORMAL LOW (ref 65–99)
Phosphorus: 4.6 mg/dL (ref 2.5–4.6)
Potassium: 4 mmol/L (ref 3.5–5.1)
Sodium: 134 mmol/L — ABNORMAL LOW (ref 135–145)

## 2017-09-10 LAB — BASIC METABOLIC PANEL
ANION GAP: 11 (ref 5–15)
BUN: 24 mg/dL — ABNORMAL HIGH (ref 6–20)
CO2: 25 mmol/L (ref 22–32)
Calcium: 8.6 mg/dL — ABNORMAL LOW (ref 8.9–10.3)
Chloride: 97 mmol/L — ABNORMAL LOW (ref 101–111)
Creatinine, Ser: 7.65 mg/dL — ABNORMAL HIGH (ref 0.44–1.00)
GFR calc Af Amer: 7 mL/min — ABNORMAL LOW (ref >60)
GFR, EST NON AFRICAN AMERICAN: 6 mL/min — AB (ref >60)
GLUCOSE: 62 mg/dL — AB (ref 65–99)
POTASSIUM: 4.1 mmol/L (ref 3.5–5.1)
Sodium: 133 mmol/L — ABNORMAL LOW (ref 135–145)

## 2017-09-10 LAB — GLUCOSE, CAPILLARY
Glucose-Capillary: 64 mg/dL — ABNORMAL LOW (ref 65–99)
Glucose-Capillary: 75 mg/dL (ref 65–99)
Glucose-Capillary: 86 mg/dL (ref 65–99)
Glucose-Capillary: 56 mg/dL — ABNORMAL LOW (ref 65–99)
Glucose-Capillary: 95 mg/dL (ref 65–99)
Glucose-Capillary: 98 mg/dL (ref 65–99)
Glucose-Capillary: 125 mg/dL — ABNORMAL HIGH (ref 65–99)

## 2017-09-10 LAB — I-STAT BETA HCG BLOOD, ED (NOT ORDERABLE): I-stat hCG, quantitative: <5 m[IU]/mL (ref <5)

## 2017-09-10 SURGERY — INSERTION OF ARTERIOVENOUS (AV) GORE-TEX GRAFT ARM
Anesthesia: General | Site: Arm Upper | Laterality: Left

## 2017-09-10 MED ORDER — FUROSEMIDE 20 MG PO TABS
20.0000 mg | ORAL_TABLET | Freq: Every day | ORAL | Status: DC
  Administered 2017-09-10 – 2017-09-15 (×6): 20 mg via ORAL
  Filled 2017-09-10 (×6): qty 1

## 2017-09-10 MED ORDER — DEXTROSE 50 % IV SOLN
25.0000 mL | Freq: Once | INTRAVENOUS | Status: AC
  Administered 2017-09-10: 25 mL via INTRAVENOUS

## 2017-09-10 MED ORDER — 0.9 % SODIUM CHLORIDE (POUR BTL) OPTIME
TOPICAL | Status: DC | PRN
  Administered 2017-09-10: 1000 mL

## 2017-09-10 MED ORDER — FENTANYL CITRATE (PF) 100 MCG/2ML IJ SOLN
INTRAMUSCULAR | Status: DC | PRN
  Administered 2017-09-10: 50 ug via INTRAVENOUS
  Administered 2017-09-10: 100 ug via INTRAVENOUS
  Administered 2017-09-10 (×2): 50 ug via INTRAVENOUS

## 2017-09-10 MED ORDER — ONDANSETRON HCL 4 MG/2ML IJ SOLN: 4.0000 mg | Freq: Once | INTRAMUSCULAR | Status: DC | PRN

## 2017-09-10 MED ORDER — INSULIN GLARGINE 100 UNIT/ML ~~LOC~~ SOLN: 10.0000 [IU] | Freq: Every day | SUBCUTANEOUS | Status: DC

## 2017-09-10 MED ORDER — ONDANSETRON HCL 4 MG/2ML IJ SOLN
INTRAMUSCULAR | Status: DC | PRN
  Administered 2017-09-10: 4 mg via INTRAVENOUS

## 2017-09-10 MED ORDER — PROPOFOL 10 MG/ML IV BOLUS
INTRAVENOUS | Status: DC | PRN
  Administered 2017-09-10: 150 mg via INTRAVENOUS

## 2017-09-10 MED ORDER — INSULIN GLARGINE 100 UNIT/ML ~~LOC~~ SOLN
10.0000 [IU] | Freq: Every day | SUBCUTANEOUS | Status: DC
  Filled 2017-09-10 (×2): qty 0.1

## 2017-09-10 MED ORDER — PROPOFOL 10 MG/ML IV BOLUS
INTRAVENOUS | Status: AC
  Filled 2017-09-10: qty 20

## 2017-09-10 MED ORDER — FENTANYL CITRATE (PF) 250 MCG/5ML IJ SOLN
INTRAMUSCULAR | Status: AC
  Filled 2017-09-10: qty 5

## 2017-09-10 MED ORDER — OXYCODONE-ACETAMINOPHEN 5-325 MG PO TABS
1.0000 | ORAL_TABLET | ORAL | Status: DC | PRN
  Administered 2017-09-11: 1 via ORAL
  Filled 2017-09-10: qty 1

## 2017-09-10 MED ORDER — INSULIN GLARGINE 100 UNIT/ML ~~LOC~~ SOLN: 12.0000 [IU] | Freq: Every day | SUBCUTANEOUS | Status: DC

## 2017-09-10 MED ORDER — MIDAZOLAM HCL 2 MG/2ML IJ SOLN
INTRAMUSCULAR | Status: AC
  Filled 2017-09-10: qty 2

## 2017-09-10 MED ORDER — SODIUM CHLORIDE 0.9 % IV SOLN
INTRAVENOUS | Status: DC | PRN
  Administered 2017-09-10: 14:00:00

## 2017-09-10 MED ORDER — MEPERIDINE HCL 25 MG/ML IJ SOLN: 6.2500 mg | INTRAMUSCULAR | Status: DC | PRN

## 2017-09-10 MED ORDER — AMLODIPINE BESYLATE 10 MG PO TABS
10.0000 mg | ORAL_TABLET | Freq: Every day | ORAL | Status: DC
  Administered 2017-09-12 – 2017-09-13 (×2): 10 mg via ORAL
  Filled 2017-09-10 (×3): qty 1

## 2017-09-10 MED ORDER — LIDOCAINE HCL (CARDIAC) 20 MG/ML IV SOLN
INTRAVENOUS | Status: DC | PRN
  Administered 2017-09-10: 100 mg via INTRAVENOUS

## 2017-09-10 MED ORDER — DEXAMETHASONE SODIUM PHOSPHATE 10 MG/ML IJ SOLN
INTRAMUSCULAR | Status: AC
  Filled 2017-09-10: qty 1

## 2017-09-10 MED ORDER — DEXTROSE 50 % IV SOLN
INTRAVENOUS | Status: AC
  Filled 2017-09-10: qty 50

## 2017-09-10 MED ORDER — LIDOCAINE 2% (20 MG/ML) 5 ML SYRINGE
INTRAMUSCULAR | Status: AC
  Filled 2017-09-10: qty 5

## 2017-09-10 MED ORDER — GLUCOSE 40 % PO GEL
1.0000 | Freq: Once | ORAL | Status: AC
  Administered 2017-09-10: 37.5 g via ORAL
  Filled 2017-09-10: qty 1

## 2017-09-10 MED ORDER — ROCURONIUM BROMIDE 100 MG/10ML IV SOLN: INTRAVENOUS | Status: DC | PRN

## 2017-09-10 MED ORDER — ONDANSETRON HCL 4 MG/2ML IJ SOLN
INTRAMUSCULAR | Status: AC
  Filled 2017-09-10: qty 2

## 2017-09-10 MED ORDER — HYDROMORPHONE HCL 1 MG/ML IJ SOLN
0.2500 mg | INTRAMUSCULAR | Status: DC | PRN
  Administered 2017-09-10 (×2): 0.5 mg via INTRAVENOUS

## 2017-09-10 MED ORDER — CLINDAMYCIN PHOSPHATE 600 MG/50ML IV SOLN
600.0000 mg | INTRAVENOUS | Status: AC
  Administered 2017-09-10: 600 mg via INTRAVENOUS
  Filled 2017-09-10: qty 50

## 2017-09-10 MED ORDER — SODIUM CHLORIDE 0.9 % IV SOLN: INTRAVENOUS | Status: DC

## 2017-09-10 MED ORDER — METOPROLOL SUCCINATE ER 25 MG PO TB24
25.0000 mg | ORAL_TABLET | Freq: Every day | ORAL | Status: DC
  Administered 2017-09-10 – 2017-09-15 (×6): 25 mg via ORAL
  Filled 2017-09-10 (×6): qty 1

## 2017-09-10 MED ORDER — LIDOCAINE-EPINEPHRINE (PF) 1 %-1:200000 IJ SOLN
INTRAMUSCULAR | Status: AC
  Filled 2017-09-10: qty 30

## 2017-09-10 MED ORDER — HYDROMORPHONE HCL 1 MG/ML IJ SOLN
INTRAMUSCULAR | Status: AC
  Filled 2017-09-10: qty 1

## 2017-09-10 MED ORDER — PHENYLEPHRINE HCL 10 MG/ML IJ SOLN
INTRAMUSCULAR | Status: DC | PRN
  Administered 2017-09-10: 40 ug/min via INTRAVENOUS

## 2017-09-10 MED ORDER — HEPARIN SODIUM (PORCINE) 1000 UNIT/ML IJ SOLN
INTRAMUSCULAR | Status: DC | PRN
  Administered 2017-09-10: 8000 [IU] via INTRAVENOUS

## 2017-09-10 MED ORDER — PROTAMINE SULFATE 10 MG/ML IV SOLN
INTRAVENOUS | Status: DC | PRN
  Administered 2017-09-10: 40 mg via INTRAVENOUS

## 2017-09-10 MED ORDER — LIDOCAINE HCL (PF) 1 % IJ SOLN
INTRAMUSCULAR | Status: AC
  Filled 2017-09-10: qty 30

## 2017-09-10 SURGICAL SUPPLY — 41 items
ARMBAND PINK RESTRICT EXTREMIT (MISCELLANEOUS) ×8 IMPLANT
CANISTER SUCT 3000ML PPV (MISCELLANEOUS) ×4 IMPLANT
CANNULA VESSEL 3MM 2 BLNT TIP (CANNULA) ×4 IMPLANT
CLIP TI WIDE RED SMALL 6 (CLIP) ×4 IMPLANT
CLIP VESOCCLUDE MED 6/CT (CLIP) ×4 IMPLANT
CLIP VESOCCLUDE SM WIDE 6/CT (CLIP) ×4 IMPLANT
COVER PROBE W GEL 5X96 (DRAPES) IMPLANT
DECANTER SPIKE VIAL GLASS SM (MISCELLANEOUS) ×4 IMPLANT
DERMABOND ADVANCED (GAUZE/BANDAGES/DRESSINGS) ×2
DERMABOND ADVANCED .7 DNX12 (GAUZE/BANDAGES/DRESSINGS) ×2 IMPLANT
ELECT REM PT RETURN 9FT ADLT (ELECTROSURGICAL) ×4
ELECTRODE REM PT RTRN 9FT ADLT (ELECTROSURGICAL) ×2 IMPLANT
GLOVE BIO SURGEON STRL SZ 6.5 (GLOVE) ×3 IMPLANT
GLOVE BIO SURGEON STRL SZ7.5 (GLOVE) ×4 IMPLANT
GLOVE BIO SURGEONS STRL SZ 6.5 (GLOVE) ×1
GLOVE BIOGEL PI IND STRL 6.5 (GLOVE) ×2 IMPLANT
GLOVE BIOGEL PI IND STRL 7.0 (GLOVE) ×6 IMPLANT
GLOVE BIOGEL PI IND STRL 8 (GLOVE) ×2 IMPLANT
GLOVE BIOGEL PI INDICATOR 6.5 (GLOVE) ×2
GLOVE BIOGEL PI INDICATOR 7.0 (GLOVE) ×6
GLOVE BIOGEL PI INDICATOR 8 (GLOVE) ×2
GLOVE SURG SS PI 6.5 STRL IVOR (GLOVE) ×4 IMPLANT
GLOVE SURG SS PI 7.0 STRL IVOR (GLOVE) ×4 IMPLANT
GOWN STRL REUS W/ TWL LRG LVL3 (GOWN DISPOSABLE) ×8 IMPLANT
GOWN STRL REUS W/ TWL XL LVL3 (GOWN DISPOSABLE) ×4 IMPLANT
GOWN STRL REUS W/TWL LRG LVL3 (GOWN DISPOSABLE) ×8
GOWN STRL REUS W/TWL XL LVL3 (GOWN DISPOSABLE) ×4
GRAFT GORETEX STRT 4-7X45 (Vascular Products) ×4 IMPLANT
KIT BASIN OR (CUSTOM PROCEDURE TRAY) ×4 IMPLANT
KIT ROOM TURNOVER OR (KITS) ×4 IMPLANT
NS IRRIG 1000ML POUR BTL (IV SOLUTION) ×4 IMPLANT
PACK CV ACCESS (CUSTOM PROCEDURE TRAY) ×4 IMPLANT
PAD ARMBOARD 7.5X6 YLW CONV (MISCELLANEOUS) ×8 IMPLANT
SPONGE SURGIFOAM ABS GEL 100 (HEMOSTASIS) IMPLANT
SUT PROLENE 6 0 BV (SUTURE) ×12 IMPLANT
SUT VIC AB 3-0 SH 27 (SUTURE) ×2
SUT VIC AB 3-0 SH 27X BRD (SUTURE) ×2 IMPLANT
SUT VICRYL 4-0 PS2 18IN ABS (SUTURE) ×4 IMPLANT
TOWEL GREEN STERILE (TOWEL DISPOSABLE) ×4 IMPLANT
UNDERPAD 30X30 (UNDERPADS AND DIAPERS) ×4 IMPLANT
WATER STERILE IRR 1000ML POUR (IV SOLUTION) ×4 IMPLANT

## 2017-09-10 NOTE — Progress Notes (Signed)
   Subjective: Patient seen and examined. States she feels weak this morning and has a headache. She wants to eat. She only head several bites of her dinner last night. Explained to the patient that it is dangerous to eat prior to surgery and that she will be unable to eat until after her surgery.   Denies chest pain or shortness of breath.   Objective:  Vital signs in last 24 hours: Vitals:   09/09/17 1326 09/09/17 2029 09/09/17 2300 09/10/17 0535  BP: (!) 167/114 (!) 176/102 (!) 159/98 (!) 171/102  Pulse: 87 88  80  Resp: 18 18  17   Temp: (!) 100.4 F (38 C) 98.4 F (36.9 C)  98.6 F (37 C)  TempSrc: Oral Oral  Oral  SpO2: 96% 98%  99%  Weight:    214 lb 4.6 oz (97.2 kg)  Height:       General: Sitting in chair, NAD HEENT: [place]/AT, no scleral icterus Cardiac: RRR, No R/M/G appreciated Pulm: normal effort, bibasilar rales, good aeration  Neuro: alert and oriented X3, cranial nerves II-XII grossly intact Skin: umbilicated, hyperkeratotic papules, some with a central, white crust diffusely scattered on patients arms and legs    Assessment/Plan:  Principal Problem:   Chest pain Active Problems:   Essential hypertension   End-stage renal disease on hemodialysis (HCC)   Obesity  T2DM Hypoglycemic this AM with BGs in mid 60s, explaining the patient's weakness. Patient ate only 25% of meal and was given 12 units of lantus prior to being made NPO at midnight. Was given 37.5 g of dextrose with a repeat BG of 75. BG prior to this were within normal limits and within inpatient goals, no prior hypoglycemic events -Will decrease Lantus dose to 10 units tonight with hold parameters if patient does not eat dinner after surgery -SSI -CBG monitoring   RLL lung opacity/pleural effusion Chest pain resolved, now only right sided back pain. Low suspicion for pneumonia at this time as patient is afebrile without leukocytosis, WBC 5.  Pain seems pleuritic in nature may be multifactorial and  related to trauma, pleural effusion, and volume overload after missing HD.   -Volatren gel and PRN tylenol for chest wall pain -Discontinue cardiac monitoring   ESRD on HD BMET WNL, hyperkalemia resolved. AVF or graft placement scheduled for this afternoon -Nephro managing HD, appreciate recs -Will follow up any post op recs from vascular  Hypertension Consistently elevated. BP 171/102. Per chart review [id number] systolic seems to be patients baseline.  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  -Benadryl Q6 hours PRN for symptom management    Dispo: Anticipated discharge in approximately 3-4 day(s) pending vascular surgery recommendations.   Melanee Spry, MD 09/10/2017, 10:18 AM Pager: [phone]

## 2017-09-10 NOTE — Op Note (Signed)
    NAME: Karen [name]    MRN: [id number] DOB: 03/21/1976    DATE OF OPERATION: 09/10/2017  PREOP DIAGNOSIS:    End-stage renal disease  POSTOP DIAGNOSIS:    Same  PROCEDURE:    Left upper arm AV graft (4-7 mm PTFE graft)  SURGEON: Judeth Cornfield. Scot Dock, MD, FACS  ASSIST: Arlee Muslim, PA  ANESTHESIA: General  EBL: Minimal  INDICATIONS:    Karen [name] is a 41 y.o. female who presents for new access she has a functioning catheter.  She had a failed right upper arm basilic vein transposition.  FINDINGS:   The basilic vein was not adequate for a basilic vein transposition.  The forearm and upper arm cephalic veins were not adequate.  The antecubital veins were also small.  For this reason I placed an upper arm graft.  TECHNIQUE:   The patient was taken to the operating room and received a general anesthetic.  The left arm was prepped and draped in usual sterile fashion.  My initial plan was to consider a basilic vein transposition.  An incision was made longitudinally above the antecubital level between the brachial artery and the basilic vein.  The basilic vein here was dissected free but quickly branched into multiple branches and was not adequate in size.  I did not think there was adequate length to do a first stage basilic vein transposition.  The adjacent brachial veins and brachial artery were identified.  The brachial artery was dissected free.  The veins were small.  I elected to place an upper arm graft.  A separate longitudinal incision was made beneath the axilla.  The high brachial vein was dissected free.  The vein was ligated distally and spatulated proximally.  The graft was tunneled between the 2 incisions.  This was a 4-7 mm PTFE graft.  Patient was heparinized.  The brachial artery was clamped proximally and distally and a longitudinal arteriotomy was made.  A segment of the 4 mm of the graft was excised, the graft spatulated and sewn end to side to the artery  using Prolene suture.  The graft was then pulled the appropriate length for anastomosis to the high brachial vein.  The graft was cut to the appropriate length, spatulated, and sewn into into the vein using continuous 6-0 Prolene suture.  At the completion there was an excellent thrill in the graft.  There was a radial and ulnar signal with the Doppler.  The heparin was partially reversed with protamine.  The wounds were then closed with a deep layer of 3-0 Vicryl the skin closed with 4-0 Vicryl.  Dermabond was applied.  Patient tolerated the procedure well was transferred to the recovery room in stable condition.  All needle and sponge counts were correct.  Deitra Mayo, MD, FACS [facility]  DATE OF DICTATION:   09/10/2017

## 2017-09-10 NOTE — Transfer of Care (Signed)
Immediate Anesthesia Transfer of Care Note  Patient: Karen [name]  Procedure(s) Performed: INSERTION OF ARTERIOVENOUS (AV) GORE-TEX GRAFT  LEFT UPPER ARM (Left Arm Upper)  Patient Location: PACU  Anesthesia Type:General  Level of Consciousness: sedated  Airway & Oxygen Therapy: Patient Spontanous Breathing and Patient connected to nasal cannula oxygen  Post-op Assessment: Report given to RN and Post -op Vital signs reviewed and stable  Post vital signs: Reviewed and stable  Last Vitals:  Vitals:   09/09/17 2300 09/10/17 0535  BP: (!) 159/98 (!) 171/102  Pulse:  80  Resp:  17  Temp:  37 C  SpO2:  99%    Last Pain:  Vitals:   09/10/17 0800  TempSrc:   PainSc: 0-No pain      Patients Stated Pain Goal: 0 (43/60/16 5800)  Complications: No apparent anesthesia complications

## 2017-09-10 NOTE — Progress Notes (Signed)
[facility] Progress Note   Dialysis Orders: TTS Ash 4 hr 2 K 2.25 Ca 400/800 EDW 94kg 2 heparin 6000 to 2 K mid tmt right IJ Mircera 200 last 12/13 Fe q HD through 12/25 sensipar q HD calcitriol 1.25  Assessment/Plan: 1. Chest pain   RLL lung opacity/pleural effusion- on Maxepime/Vanc - not convincing for PNA but having some cough- afebrile WBC normal; improving 2. ESRD - TTS; s/p LUE AVG 12/21, appreciate help of VVS; next HD Saturday if not discharged Friday after AVG: 2K, 5L, TDC, No heparin 3. Anemia -  hgb stable- not due for ESA until next week - continue q HD Fe 4. Secondary hyperparathyroidism - Ca 8.5 - continue sensipar TTS, calcitriol q HD and binders 5. HTN/volume - BP too high; hypervolemic 2/2 inadequat UF at HD from s/o / compliance.  titrate EDW while here for better BP control; changed amlodipine dosing to HS.  6. Nutrition - alb 3.7 renal carb mod -  Pearson Grippe MD [place] [phone] 09/10/2017,3:31 PM  LOS: 3 days   Subjective:    LUE AVG today S/o early for HD yesterday HD yesterday: post weight 95.4kg, 1.7L UF; tolerated well; signed off treatment  Objective Vitals:   09/09/17 2029 09/09/17 2300 09/10/17 0535 09/10/17 1119  BP: (!) 176/102 (!) 159/98 (!) 171/102   Pulse: 88  80   Resp: 18  17   Temp: 98.4 F (36.9 C)  98.6 F (37 C)   TempSrc: Oral  Oral   SpO2: 98%  99%   Weight:   97.2 kg (214 lb 4.6 oz) 97.1 kg (214 lb)  Height:    5\' 6"  (1.676 m)   Physical Exam General: in PACU, groggy Heart: RRR Lungs:ctab Abdomen: obese soft NT Extremities: left BKA no edema right LE no edema Dialysis Access: right IJ TDC; LUE AVG +B   Additional Objective Labs: Basic Metabolic Panel: Recent Labs  Lab 09/07/17 0802 09/09/17 0710 09/10/17 0422  NA 136 134* 134*  133*  K 5.5* 5.4* 4.0  4.1  CL 101 100* 97*  97*  CO2 20* 22 26  25   GLUCOSE 151* 88 64*  62*  BUN 73* 53* 25*  24*  CREATININE 13.19*  11.07* 7.68*  7.65*  CALCIUM 8.8* 8.5* 8.5*  8.6*  PHOS  --   --  4.6   Liver Function Tests: Recent Labs  Lab 09/07/17 0802 09/10/17 0422  AST 14*  --   ALT 11*  --   ALKPHOS 74  --   BILITOT 0.8  --   PROT 8.1  --   ALBUMIN 3.7 3.2*   Recent Labs  Lab 09/07/17 0802  LIPASE 21   CBC: Recent Labs  Lab 09/07/17 0802 09/09/17 0710 09/10/17 0422  WBC 6.7 5.0 4.9  NEUTROABS 4.4  --   --   HGB 11.3* 11.2* 11.6*  HCT 36.9 36.6 37.7  MCV 89.6 89.5 90.4  PLT 213 172 129*   Blood Culture    Component Value Date/Time   SDES BLOOD RIGHT ANTECUBITAL 09/07/2017 1830   SPECREQUEST IN PEDIATRIC BOTTLE Blood Culture adequate volume 09/07/2017 1830   CULT NO GROWTH 3 DAYS 09/07/2017 1830   REPTSTATUS PENDING 09/07/2017 1830    Cardiac Enzymes: Recent Labs  Lab 09/07/17 0802  TROPONINI 0.06*   CBG: Recent Labs  Lab 09/09/17 1531 09/09/17 2132 09/10/17 0755 09/10/17 0912 09/10/17 1120  GLUCAP 75 122*  122* 64* 75 86   Iron  Studies: No results for input(s): IRON, TIBC, TRANSFERRIN, FERRITIN in the last 72 hours. Lab Results  Component Value Date   INR 1.15 02/08/2013   Studies/Results: No results found. Medications: . sodium chloride    . [MAR Hold] ferric gluconate (FERRLECIT/NULECIT) IV Stopped (09/09/17 1159)   . [MAR Hold] amLODipine  10 mg Oral QHS  . [MAR Hold] calcitRIOL  1.5 mcg Oral Q T,Th,Sa-HD  . [MAR Hold] calcium acetate  1,334 mg Oral TID WC  . [MAR Hold] Chlorhexidine Gluconate Cloth  6 each Topical q morning - 10a  . [MAR Hold] cinacalcet  30 mg Oral Q T,Th,Sa-HD  . [MAR Hold] heparin  5,000 Units Subcutaneous Q8H  . [MAR Hold] insulin aspart  0-9 Units Subcutaneous TID WC  . [MAR Hold] insulin glargine  10 Units Subcutaneous QHS  . [MAR Hold] multivitamin  1 tablet Oral QHS  . [MAR Hold] mupirocin ointment  1 application Nasal BID

## 2017-09-10 NOTE — Anesthesia Postprocedure Evaluation (Signed)
Anesthesia Post Note  Patient: Karen [name]  Procedure(s) Performed: INSERTION OF ARTERIOVENOUS (AV) GORE-TEX GRAFT  LEFT UPPER ARM (Left Arm Upper)     Patient location during evaluation: PACU Anesthesia Type: General Level of consciousness: awake and alert Pain management: pain level controlled Vital Signs Assessment: post-procedure vital signs reviewed and stable Respiratory status: spontaneous breathing, nonlabored ventilation, respiratory function stable and patient connected to nasal cannula oxygen Cardiovascular status: blood pressure returned to baseline and stable Postop Assessment: no apparent nausea or vomiting Anesthetic complications: no    Last Vitals:  Vitals:   09/10/17 1631 09/10/17 1632  BP:    Pulse: 87   Resp: 12   Temp:  36.5 C  SpO2: 100%     Last Pain:  Vitals:   09/10/17 1620  TempSrc:   PainSc: 9                  Zaela Graley DAVID

## 2017-09-10 NOTE — Anesthesia Preprocedure Evaluation (Signed)
Anesthesia Evaluation  Patient identified by MRN, date of birth, ID band Patient awake    Reviewed: Allergy & Precautions, NPO status , Patient's Chart, lab work & pertinent test results  Airway Mallampati: I  TM Distance: >3 FB Neck ROM: Full    Dental   Pulmonary former smoker,    Pulmonary exam normal        Cardiovascular hypertension, Pt. on medications Normal cardiovascular exam     Neuro/Psych Anxiety    GI/Hepatic GERD  Medicated and Controlled,  Endo/Other  diabetes, Type 2, Oral Hypoglycemic Agents  Renal/GU ESRF and DialysisRenal disease     Musculoskeletal   Abdominal   Peds  Hematology   Anesthesia Other Findings   Reproductive/Obstetrics                             Anesthesia Physical Anesthesia Plan  ASA: III  Anesthesia Plan: General   Post-op Pain Management:    Induction: Intravenous  PONV Risk Score and Plan: 3 and Ondansetron and Treatment may vary due to age or medical condition  Airway Management Planned: LMA  Additional Equipment:   Intra-op Plan:   Post-operative Plan: Extubation in OR  Informed Consent: I have reviewed the patients History and Physical, chart, labs and discussed the procedure including the risks, benefits and alternatives for the proposed anesthesia with the patient or authorized representative who has indicated his/her understanding and acceptance.     Plan Discussed with: CRNA and Surgeon  Anesthesia Plan Comments:         Anesthesia Quick Evaluation

## 2017-09-10 NOTE — Progress Notes (Signed)
  Date: 09/10/2017  Patient name: Karen [name]  Medical record number: [id number]  Date of birth: 01/16/1976   I have seen and evaluated this patient and I have discussed the plan of care with the house staff. Please see their note for complete details. I concur with their findings with the following additions/corrections: For fistula today. Dr Aggie Hacker managing insulin due to NPO last night for OR and decreased PO intake.  Bartholomew Crews, MD 09/10/2017, 2:11 PM

## 2017-09-10 NOTE — Interval H&P Note (Signed)
History and Physical Interval Note:  09/10/2017 12:37 PM  Karen [name]  has presented today for surgery, with the diagnosis of END STAGE RENAL DISEASE  The various methods of treatment have been discussed with the patient and family. After consideration of risks, benefits and other options for treatment, the patient has consented to  Procedure(s): ARTERIOVENOUS (AV) FISTULA CREATION VERSUS ARTERIOVENOUS GRAFT LEFT (Left) as a surgical intervention .  The patient's history has been reviewed, patient examined, no change in status, stable for surgery.  I have reviewed the patient's chart and labs.  Questions were answered to the patient's satisfaction.     Deitra Mayo

## 2017-09-10 NOTE — Anesthesia Procedure Notes (Signed)
Procedure Name: LMA Insertion Date/Time: 09/10/2017 1:35 PM Performed by: Izora Gala, CRNA Pre-anesthesia Checklist: Patient identified, Emergency Drugs available, Suction available and Patient being monitored Patient Re-evaluated:Patient Re-evaluated prior to induction Oxygen Delivery Method: Circle system utilized Preoxygenation: Pre-oxygenation with 100% oxygen Induction Type: IV induction Ventilation: Mask ventilation without difficulty LMA Size: 4.0 Number of attempts: 1 Placement Confirmation: positive ETCO2 Tube secured with: Tape Dental Injury: Teeth and Oropharynx as per pre-operative assessment

## 2017-09-11 ENCOUNTER — Inpatient Hospital Stay (HOSPITAL_COMMUNITY): Payer: Medicare Other

## 2017-09-11 LAB — RENAL FUNCTION PANEL
ANION GAP: 11 (ref 5–15)
Albumin: 3.1 g/dL — ABNORMAL LOW (ref 3.5–5.0)
BUN: 34 mg/dL — ABNORMAL HIGH (ref 6–20)
CALCIUM: 8 mg/dL — AB (ref 8.9–10.3)
CHLORIDE: 99 mmol/L — AB (ref 101–111)
CO2: 24 mmol/L (ref 22–32)
CREATININE: 9.38 mg/dL — AB (ref 0.44–1.00)
GFR calc Af Amer: 5 mL/min — ABNORMAL LOW (ref >60)
GFR calc non Af Amer: 5 mL/min — ABNORMAL LOW (ref >60)
GLUCOSE: 103 mg/dL — AB (ref 65–99)
Phosphorus: 6.8 mg/dL — ABNORMAL HIGH (ref 2.5–4.6)
Potassium: 4.9 mmol/L (ref 3.5–5.1)
SODIUM: 134 mmol/L — AB (ref 135–145)

## 2017-09-11 LAB — CBC
HCT: 34.6 % — ABNORMAL LOW (ref 36.0–46.0)
Hemoglobin: 10.7 g/dL — ABNORMAL LOW (ref 12.0–15.0)
MCH: 27.7 pg (ref 26.0–34.0)
MCHC: 30.9 g/dL (ref 30.0–36.0)
MCV: 89.6 fL (ref 78.0–100.0)
Platelets: 132 10*3/uL — ABNORMAL LOW (ref 150–400)
RBC: 3.86 MIL/uL — ABNORMAL LOW (ref 3.87–5.11)
RDW: 18.8 % — ABNORMAL HIGH (ref 11.5–15.5)
WBC: 7.1 10*3/uL (ref 4.0–10.5)

## 2017-09-11 LAB — GLUCOSE, CAPILLARY
Glucose-Capillary: 118 mg/dL — ABNORMAL HIGH (ref 65–99)
Glucose-Capillary: 112 mg/dL — ABNORMAL HIGH (ref 65–99)
Glucose-Capillary: 124 mg/dL — ABNORMAL HIGH (ref 65–99)

## 2017-09-11 MED ORDER — CALCITRIOL 0.5 MCG PO CAPS
ORAL_CAPSULE | ORAL | Status: AC
  Filled 2017-09-11: qty 3

## 2017-09-11 MED ORDER — INSULIN GLARGINE 100 UNIT/ML ~~LOC~~ SOLN
8.0000 [IU] | Freq: Every day | SUBCUTANEOUS | Status: DC
  Administered 2017-09-12: 8 [IU] via SUBCUTANEOUS
  Filled 2017-09-11 (×3): qty 0.08

## 2017-09-11 NOTE — Procedures (Signed)
I was present at this dialysis session. I have reviewed the session itself and made appropriate changes.   4L UF.  S/p AVG LUE yesterday. 2K.  Encouraged full tx.  She is aware that HD is on holiday schedule, next HD will be 12/24, either here or at outpt HD.   No inpatient renal issues, ok for DC.   Filed Weights   09/10/17 0535 09/10/17 1119 09/11/17 0500  Weight: 97.2 kg (214 lb 4.6 oz) 97.1 kg (214 lb) 93.7 kg (206 lb 9.1 oz)    Recent Labs  Lab 09/11/17 0737  NA 134*  K 4.9  CL 99*  CO2 24  GLUCOSE 103*  BUN 34*  CREATININE 9.38*  CALCIUM 8.0*  PHOS 6.8*    Recent Labs  Lab 09/07/17 0802 09/09/17 0710 09/10/17 0422  WBC 6.7 5.0 4.9  NEUTROABS 4.4  --   --   HGB 11.3* 11.2* 11.6*  HCT 36.9 36.6 37.7  MCV 89.6 89.5 90.4  PLT 213 172 129*    Scheduled Meds: . amLODipine  10 mg Oral QHS  . calcitRIOL  1.5 mcg Oral Q T,Th,Sa-HD  . calcium acetate  1,334 mg Oral TID WC  . Chlorhexidine Gluconate Cloth  6 each Topical q morning - 10a  . cinacalcet  30 mg Oral Q T,Th,Sa-HD  . furosemide  20 mg Oral Daily  . heparin  5,000 Units Subcutaneous Q8H  . insulin aspart  0-9 Units Subcutaneous TID WC  . insulin glargine  10 Units Subcutaneous QHS  . metoprolol succinate  25 mg Oral Daily  . multivitamin  1 tablet Oral QHS  . mupirocin ointment  1 application Nasal BID   Continuous Infusions: . sodium chloride    . ferric gluconate (FERRLECIT/NULECIT) IV Stopped (09/09/17 1159)   PRN Meds:.acetaminophen **OR** acetaminophen, diclofenac sodium, diphenhydrAMINE, ondansetron, oxyCODONE-acetaminophen   Pearson Grippe  MD 09/11/2017, 8:40 AM

## 2017-09-11 NOTE — Progress Notes (Signed)
    Subjective  - POD #1, s/p L UE AVGG  Complaining of pain at the site of graft placement.  She denies any pain in her hand   Physical Exam:  Tenderness around the graft which has a palpable thrill. Good grip strength in her hand.        Assessment/Plan:  POD #1  Status post left upper arm graft.  Does not have any evidence of steal.  The graft is functioning.  She will follow-up as an outpatient.  Please contact us with any further questions.  Wells Brabham 09/11/2017 11:00 AM --  Vitals:   09/11/17 1000 09/11/17 1030  BP: 137/78 133/86  Pulse: 81 88  Resp: 19 20  Temp:    SpO2:      Intake/Output Summary (Last 24 hours) at 09/11/2017 1100 Last data filed at 09/11/2017 0000 Gross per 24 hour  Intake 493 ml  Output 210 ml  Net 283 ml     Laboratory CBC    Component Value Date/Time   WBC 7.1 09/11/2017 0834   HGB 10.7 (L) 09/11/2017 0834   HCT 34.6 (L) 09/11/2017 0834   PLT 132 (L) 09/11/2017 0834    BMET    Component Value Date/Time   NA 134 (L) 09/11/2017 0737   K 4.9 09/11/2017 0737   CL 99 (L) 09/11/2017 0737   CO2 24 09/11/2017 0737   GLUCOSE 103 (H) 09/11/2017 0737   BUN 34 (H) 09/11/2017 0737   CREATININE 9.38 (H) 09/11/2017 0737   CALCIUM 8.0 (L) 09/11/2017 0737   GFRNONAA 5 (L) 09/11/2017 0737   GFRAA 5 (L) 09/11/2017 0737    COAG Lab Results  Component Value Date   INR 1.15 02/08/2013   No results found for: PTT  Antibiotics Anti-infectives (From admission, onward)   Start     Dose/Rate Route Frequency Ordered Stop   09/10/17 1300  clindamycin (CLEOCIN) IVPB 600 mg     600 mg 100 mL/hr over 30 Minutes Intravenous To [facility]Stay Surgical 09/10/17 0111 09/10/17 1330   09/09/17 1800  ceFEPIme (MAXIPIME) 2 g in dextrose 5 % 50 mL IVPB  Status:  Discontinued     2 g 100 mL/hr over 30 Minutes Intravenous Every T-Th-Sa (1800) 09/07/17 0947 09/08/17 1030   09/09/17 1200  vancomycin (VANCOCIN) IVPB 1000 mg/200 mL premix  Status:   Discontinued     1,000 mg 200 mL/hr over 60 Minutes Intravenous Every T-Th-Sa (Hemodialysis) 09/07/17 0933 09/08/17 1030   09/07/17 1800  ceFEPIme (MAXIPIME) 2 g in dextrose 5 % 50 mL IVPB  Status:  Discontinued     2 g 100 mL/hr over 30 Minutes Intravenous Every T-Th-Sa (1800) 09/07/17 0946 09/07/17 0947   09/07/17 1000  ceFEPIme (MAXIPIME) 1 g in dextrose 5 % 50 mL IVPB  Status:  Discontinued     1 g 100 mL/hr over 30 Minutes Intravenous  Once 09/07/17 0930 09/07/17 0931   09/07/17 1000  ceFEPIme (MAXIPIME) 2 g in dextrose 5 % 50 mL IVPB     2 g 100 mL/hr over 30 Minutes Intravenous  Once 09/07/17 0931 09/07/17 1206   09/07/17 0945  vancomycin (VANCOCIN) 2,000 mg in sodium chloride 0.9 % 500 mL IVPB     2,000 mg 250 mL/hr over 120 Minutes Intravenous  Once 09/07/17 0930 09/07/17 2011       V. Leia Alf, M.D. [facility] [place] Office: [phone] Pager:  [phone]

## 2017-09-11 NOTE — Progress Notes (Signed)
   Subjective: Patient seen and examined. She was getting HD. Denies chest pain or shortness of breath. States she is sore after her surgery. No other acute complaints, but patient was drowsy so somewhat difficult to communicate with.   Per overnight nursing note patient refused all qhs meds.  Objective:  Vital signs in last 24 hours: Vitals:   09/10/17 1632 09/10/17 1713 09/10/17 2000 09/11/17 0500  BP:  (!) 177/130 (!) 165/95 (!) 151/97  Pulse:  89 81 82  Resp:  16 14 15   Temp: 97.7 F (36.5 C) 98.3 F (36.8 C) 97.7 F (36.5 C) 98.4 F (36.9 C)  TempSrc:  Axillary Oral Oral  SpO2:  100% 99% 95%  Weight:    206 lb 9.1 oz (93.7 kg)  Height:       General: Laying in bed, NAD HEENT: [place]/AT, no scleral icterus Cardiac: RRR, No R/M/G appreciated Pulm: normal effort, good aeration with course breath sounds in anterior lung fields Neuro: alert and oriented X3, cranial nerves II-XII grossly intact Extremities: Left upper arm erythematous, incision clean and dry Skin: umbilicated, hyperkeratotic papules, some with a central, white crust diffusely scattered on patients arms and legs    Assessment/Plan:  Principal Problem:   Chest pain Active Problems:   Essential hypertension   End-stage renal disease on hemodialysis (HCC)   Obesity  T2DM Hypoglycemic after surgery yesterday with BG of 56 given an amp of IV dextrose, which increased BG to high 90s. No lantus given last night as patient declined.  BG this AM 125. -Will continue decreased Lantus dose of 10 units qhs -SSI -CBG monitoring   Hypertension Consistently elevated, 151/97 this AM.  High BP likely due to hypervolemia, typically improves after HD. -Increased amlodipine to 10 mg qhs, patient declined taking last night   ESRD on HD Post op Day 1 upper arm AV graft. Tolerated procedure well. HD today. 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  -Benadryl Q6 hours PRN for symptom management    Dispo: Anticipated discharge in approximately 1-2 day(s) pending vascular surgery recommendations.   Melanee Spry, MD 09/11/2017, 7:14 AM Pager: [phone]

## 2017-09-11 NOTE — ED Provider Notes (Signed)
Patient refused all scheduled HS medications. Educated and encouraged to take medications, but still refused.

## 2017-09-11 NOTE — Evaluation (Signed)
Clinical/Bedside Swallow Evaluation Patient Details  Name: Karen [name] MRN: [id number] Date of Birth: July 18, 1976  Today's Date: 09/11/2017 Time: SLP Start Time (ACUTE ONLY): 2376 SLP Stop Time (ACUTE ONLY): 1515 SLP Time Calculation (min) (ACUTE ONLY): 17 min  Past Medical History:  Past Medical History:  Diagnosis Date  . Anemia   . Anxiety   . Dyspnea    "when I dont go to dialysis"  . Eczema   . ESRD (end stage renal disease) ([place])    [place] - [facility]- [place]  . Exertional shortness of breath    "recently; it's fluid" (02/02/2013)  . GERD (gastroesophageal reflux disease)   . Headache   . History of blood transfusion    "last week" (02/02/2013)  . History of kidney stones    passed  . Hypertension   . Ischemic cardiomyopathy    by echo 2014  . Neuropathy   . Neuropathy   . Osteomyelitis of toe of left foot ([place])    "off and on since 2009; no OR" (02/02/2013)  . Pneumonia 09/2016  . Type II diabetes mellitus ([place]) 1995   Past Surgical History:  Past Surgical History:  Procedure Laterality Date  . AMPUTATION Left 02/06/2013   Procedure: AMPUTATION LEFT GREAT TOE;  Surgeon: Wylene Simmer, MD;  Location: [facility];  Service: Orthopedics;  Laterality: Left;  . AMPUTATION Left 06/24/2013   Procedure: AMPUTATION BELOW KNEE ;  Surgeon: Wylene Simmer, MD;  Location: [facility];  Service: Orthopedics;  Laterality: Left;  . AV FISTULA PLACEMENT Right 02/08/2017   Procedure: CREATION OF RIGHT ARM  BASILIC VEIN TO BRACHIAL ARTERY ARTERIOVENOUS (AV) FISTULA;  Surgeon: Rosetta Posner, MD;  Location: [facility];  Service: Vascular;  Laterality: Right;  . BASCILIC VEIN TRANSPOSITION Right 04/14/2017   Procedure: BASCILIC VEIN TRANSPOSITION-RIGHT 2ND STAGE;  Surgeon: Rosetta Posner, MD;  Location: [facility];  Service: Vascular;  Laterality: Right;  . CESAREAN SECTION  10/18/2006  . INSERTION OF DIALYSIS CATHETER     HPI:  Ms. Davison is a 41 yo Fwith history of ESRD on HD, type 2 diabetes, diabetic neuropathy, PAD  status post a left BKA, and hypertension, who was admitted with chest pain. CXR showed RLL opacity/pleural effusion; per MD low suspicion for PNA (afebrile, WBC normal). Evaluated by vascular surgery and s/p left arm AV graft. Pt was intubated for procedure; per RN has had difficulty with swallowing post-procedure. Swallow evaluation ordered.   Assessment / Plan / Recommendation Clinical Impression  Patient presents with moderate risk for aspiration primarily due to confusion, lethargy. Of note, pt's speech is significantly dysarthric; per RN possibly due to anesthesia. Oral motor examination is unremarkable for focal deficit, though pt has generalized weakness and decreased oral, lingal movements. For liquids, swallow initiation appears timely with no overt signs of aspiration, even with challenging via consecutive straw sips. With solids, pt has reduced attention to PO trials; holds boluses orally and even falling asleep. Pt verbally agitated when SLP prompting to clear oral cavity. Ultimately SLP was able to facilitate oral clearance with max cues and liquid wash. At this time her mentation is greatest risk for aspiration. Recommend full liquids and crushed meds in puree for now; will reassess and advance solids as mentation improves. MD paged.  SLP Visit Diagnosis: Dysphagia, unspecified (R13.10)    Aspiration Risk  Moderate aspiration risk    Diet Recommendation Thin liquid(full liquids)   Liquid Administration via: Cup;Straw Medication Administration: Crushed with puree Supervision: Full supervision/cueing for compensatory  strategies Compensations: Slow rate;Small sips/bites;Minimize environmental distractions Postural Changes: Seated upright at 90 degrees    Other  Recommendations Oral Care Recommendations: Oral care BID   Follow up Recommendations Other (comment)(tbd)      Frequency and Duration min 2x/week  1 week       Prognosis Prognosis for Safe Diet Advancement:  Good Barriers to Reach Goals: Cognitive deficits      Swallow Study   General Date of Onset: 09/07/17 HPI: Ms. Mcenery is a 41 yo Fwith history of ESRD on HD, type 2 diabetes, diabetic neuropathy, PAD status post a left BKA, and hypertension, who was admitted with chest pain. CXR showed RLL opacity/pleural effusion; per MD low suspicion for PNA (afebrile, WBC normal). Evaluated by vascular surgery and s/p left arm AV graft. Pt was intubated for procedure; per RN has had difficulty with swallowing post-procedure. Swallow evaluation ordered. Type of Study: Bedside Swallow Evaluation Previous Swallow Assessment: none in chart Diet Prior to this Study: Regular;Thin liquids Temperature Spikes Noted: No Respiratory Status: Room air History of Recent Intubation: Yes Length of Intubations (days): (for procedure) Date extubated: 09/10/17 Behavior/Cognition: Confused;Requires cueing;Doesn't follow directions;Distractible;Lethargic/Drowsy Oral Cavity Assessment: Within Functional Limits Oral Care Completed by SLP: Yes Oral Cavity - Dentition: Adequate natural dentition Vision: Functional for self-feeding Self-Feeding Abilities: Needs assist Patient Positioning: Upright in bed Baseline Vocal Quality: Normal(mild hoarseness) Volitional Cough: Cognitively unable to elicit Volitional Swallow: Able to elicit    Oral/Motor/Sensory Function Overall Oral Motor/Sensory Function: Generalized oral weakness   Ice Chips Ice chips: Within functional limits Presentation: Spoon   Thin Liquid Thin Liquid: Within functional limits Presentation: Cup;Straw;Self Fed    Nectar Thick Nectar Thick Liquid: Not tested   Honey Thick Honey Thick Liquid: Not tested   Puree Puree: Impaired Presentation: Spoon Oral Phase Impairments: Poor awareness of bolus Oral Phase Functional Implications: Oral holding;Prolonged oral transit Other Comments: belching   Solid   GO   Deneise Lever, MS, CCC-SLP Speech-Language  Pathologist [phone] Solid: Impaired Presentation: Self Fed;Spoon Oral Phase Impairments: Poor awareness of bolus;Reduced lingual movement/coordination;Impaired mastication Oral Phase Functional Implications: Oral holding;Prolonged oral transit(lingual residue)        Aliene Altes 09/11/2017,3:27 PM

## 2017-09-11 NOTE — Progress Notes (Signed)
[facility] Progress Note  Dialysis Orders:TTS [facility] 4 hr 2 K 2.25 Ca 400/800 EDW 94kg 2 heparin 6000 to 2 K mid tmt right IJ Mircera 200 last 12/13 Fe q HD through 12/25 sensipar q HD calcitriol 1.25  Assessment/Plan: 1.Chest pain  RLL lung opacity/pleural effusion- on Maxepime/Vanc - not convincing for PNA; coughing much more today while taking liquids- ? Related to ET tube yesterday- difficulty with liquids/ice- said she ate last night without problems. 2. ESRD- TTS; s/p LUE AVG 12/21, appreciate help of VVS; next HD Saturday if not discharged Friday after AVG: 2K, 5L, TDC, No heparin K 4.9- next HD Monday due to holiday schedule 3. Anemia- hgb stable- not due for ESA until next week - continue q HD Fe CBC today pending 4. Secondary hyperparathyroidism- Ca 8.5- continue sensipar TTS, calcitriol q HD and binders P 6.8 5.HTN/volume- BP better with 10 amlodipine; hypervolemic 2/2 inadequat UF at HD from s/o / compliance.  titrating EDW while here for better BP control; goal 4.5 today  6. Nutrition- alb 3.1renal carb mod - 7. Disp - ready for d/c from renal perspective - may need at least beside swallow if she continues to have coughing with swallowing  Myriam Jacobson, PA-C [place] [phone] 09/11/2017,8:20 AM  LOS: 4 days   Subjective:   Arm hurts-coughing - very focused on watching [place] [employer] on HD  Objective Vitals:   09/11/17 0700 09/11/17 0710 09/11/17 0730 09/11/17 0800  BP: (!) 162/81 (!) 157/87 (!) 147/79 (!) 144/78  Pulse: 83 84 79 79  Resp: 16 17 18    Temp: (!) 97.4 F (36.3 C)     TempSrc: Oral     SpO2: 96%     Weight:      Height:       Physical Exam General: NAD Heart: RRR Lungs: dim BS but no overt rales Abdomen: obese soft Extremities: left BKA and RLE no edema Dialysis Access:  Right IJ TDC and new left upper AVGG + bruit mild edema   Additional Objective Labs: Basic Metabolic Panel: Recent Labs  Lab  09/07/17 0802 09/09/17 0710 09/10/17 0422  NA 136 134* 134*  133*  K 5.5* 5.4* 4.0  4.1  CL 101 100* 97*  97*  CO2 20* 22 26  25   GLUCOSE 151* 88 64*  62*  BUN 73* 53* 25*  24*  CREATININE 13.19* 11.07* 7.68*  7.65*  CALCIUM 8.8* 8.5* 8.5*  8.6*  PHOS  --   --  4.6   Liver Function Tests: Recent Labs  Lab 09/07/17 0802 09/10/17 0422  AST 14*  --   ALT 11*  --   ALKPHOS 74  --   BILITOT 0.8  --   PROT 8.1  --   ALBUMIN 3.7 3.2*   Recent Labs  Lab 09/07/17 0802  LIPASE 21   CBC: Recent Labs  Lab 09/07/17 0802 09/09/17 0710 09/10/17 0422  WBC 6.7 5.0 4.9  NEUTROABS 4.4  --   --   HGB 11.3* 11.2* 11.6*  HCT 36.9 36.6 37.7  MCV 89.6 89.5 90.4  PLT 213 172 129*   Blood Culture    Component Value Date/Time   SDES BLOOD RIGHT ANTECUBITAL 09/07/2017 1830   SPECREQUEST IN PEDIATRIC BOTTLE Blood Culture adequate volume 09/07/2017 1830   CULT NO GROWTH 3 DAYS 09/07/2017 1830   REPTSTATUS PENDING 09/07/2017 1830    Cardiac Enzymes: Recent Labs  Lab 09/07/17 0802  TROPONINI 0.06*  CBG: Recent Labs  Lab 09/10/17 1120 09/10/17 1540 09/10/17 1631 09/10/17 1711 09/10/17 2122  GLUCAP 86 56* 95 98 125*   Iron Studies: No results for input(s): IRON, TIBC, TRANSFERRIN, FERRITIN in the last 72 hours. Lab Results  Component Value Date   INR 1.15 02/08/2013   Studies/Results: No results found. Medications: . sodium chloride    . ferric gluconate (FERRLECIT/NULECIT) IV Stopped (09/09/17 1159)   . amLODipine  10 mg Oral QHS  . calcitRIOL  1.5 mcg Oral Q T,Th,Sa-HD  . calcium acetate  1,334 mg Oral TID WC  . Chlorhexidine Gluconate Cloth  6 each Topical q morning - 10a  . cinacalcet  30 mg Oral Q T,Th,Sa-HD  . furosemide  20 mg Oral Daily  . heparin  5,000 Units Subcutaneous Q8H  . insulin aspart  0-9 Units Subcutaneous TID WC  . insulin glargine  10 Units Subcutaneous QHS  . metoprolol succinate  25 mg Oral Daily  . multivitamin  1 tablet Oral  QHS  . mupirocin ointment  1 application Nasal BID

## 2017-09-11 NOTE — Progress Notes (Signed)
  Date: 09/11/2017  Patient name: Karen [name]  Medical record number: [id number]  Date of birth: 1976/06/10   This patient's plan of care was discussed with the house staff. Please see Dr. Revonda Standard note for complete details. I concur with her findings.  Also Swallow evaluation today.    Sid Falcon, MD 09/11/2017, 1:16 PM

## 2017-09-11 NOTE — Progress Notes (Signed)
Patient re-evaluated due to continued dysarthria. She was given one dose of oxy-apap 5-325mg  at 11:45am. CBGs today have been appropriate. She denies taking anything other than what was being administered to her in the hospital.  Patient endorses dysarthria as well along with drowsiness, denies dysphagia, headache, vision or hearing changes, weakness, numbness. She denies previous episodes prior to this hospitalization. She denies family or personal history of autoimmune disease.  On exam, patient is A&Ox4, strength and sensation intact in all 4 extremities; on CN exam noted to have dysarthria and right anisocoria which patient states is chronic - otherwise CN intact. No ptosis noted on prolonged upward gaze. Finger to nose intact on R, unable to perform due to pain of fistula site on left.  Patient did not want [place] to talk to her family at this time to assess long-term baseline status.  Plan: --CT head wo contrast --if negative and dysarthria still present, will go ahead and get MRI brain --all sedatives including benadryl and narcotics have been d/c'd this morning  Alphonzo Grieve, MD IMTS - PGY2 Pager [phone]

## 2017-09-11 NOTE — Progress Notes (Addendum)
Went to bedside to evaluate patients neurological status. Per SLP was having severe dysarthria and dysphagia during evaluation. Patient was AOx4, drowsy and lethargic. No gross cranial nerve deficits, but mouth movements slowed and weak. and 5/5 strength symmetric in upper extremities distal and proximal; 5/5 proximal strength of lower extremities bilaterally; right lower extremity distal strength 5/5. Left cannot be assessed due to BKA. Per patient she sometimes gets drowsy with dialysis, but typically does not have diffculty speaking when getting outpatient dialysis. She stated "she's not crazy" when asking her orientation questions. Denied taking anything from outside the hospital. Patient seems to wax and wane with lethargy and alertness. She was mildly dysarthric and lethargic the first day I examined her.   Plan: -CT scan of head, follow up MRI if negative  -d/ced all sedating medications this AM -diet per SLP orders

## 2017-09-11 NOTE — Progress Notes (Signed)
Patient refused all scheduled bedtime medications. Educated on importance of taking prescribed medications. Acknowledged understanding, but continues to refuse.

## 2017-09-12 ENCOUNTER — Inpatient Hospital Stay (HOSPITAL_COMMUNITY): Payer: Medicare Other

## 2017-09-12 DIAGNOSIS — I361 Nonrheumatic tricuspid (valve) insufficiency: Secondary | ICD-10-CM

## 2017-09-12 DIAGNOSIS — R930 Abnormal findings on diagnostic imaging of skull and head, not elsewhere classified: Secondary | ICD-10-CM

## 2017-09-12 DIAGNOSIS — R21 Rash and other nonspecific skin eruption: Secondary | ICD-10-CM

## 2017-09-12 DIAGNOSIS — I120 Hypertensive chronic kidney disease with stage 5 chronic kidney disease or end stage renal disease: Secondary | ICD-10-CM

## 2017-09-12 DIAGNOSIS — Z89512 Acquired absence of left leg below knee: Secondary | ICD-10-CM

## 2017-09-12 DIAGNOSIS — Z794 Long term (current) use of insulin: Secondary | ICD-10-CM

## 2017-09-12 DIAGNOSIS — E1122 Type 2 diabetes mellitus with diabetic chronic kidney disease: Secondary | ICD-10-CM

## 2017-09-12 DIAGNOSIS — Z79899 Other long term (current) drug therapy: Secondary | ICD-10-CM

## 2017-09-12 LAB — ECHOCARDIOGRAM COMPLETE
CHL CUP DOP CALC LVOT VTI: 20.5 cm
CHL CUP MV DEC (S): 197
CHL CUP RV SYS PRESS: 32 mmHg
CHL CUP TV REG PEAK VELOCITY: 267 cm/s
E decel time: 197 msec
E/e' ratio: 14.13
FS: 20 % — AB (ref 28–44)
HEIGHTINCHES: 66 in
IV/PV OW: 1.01
LA diam index: 1.81 cm/m2
LA vol A4C: 71.9 ml
LA vol: 72.3 mL
LASIZE: 39 mm
LAVOLIN: 33.6 mL/m2
LDCA: 3.8 cm2
LEFT ATRIUM END SYS DIAM: 39 mm
LV E/e' medial: 14.13
LV PW d: 11.8 mm — AB (ref 0.6–1.1)
LV TDI E'LATERAL: 6.85
LV TDI E'MEDIAL: 3.92
LV e' LATERAL: 6.85 cm/s
LVEEAVG: 14.13
LVOTD: 22 mm
LVOTPV: 108 cm/s
LVOTSV: 78 mL
Lateral S' vel: 11.1 cm/s
MV Peak grad: 4 mmHg
MV pk A vel: 97.7 m/s
MVPKEVEL: 96.8 m/s
TAPSE: 15.5 mm
TR max vel: 267 cm/s
WEIGHTICAEL: 3386.27 [oz_av]

## 2017-09-12 LAB — CULTURE, BLOOD (ROUTINE X 2)
CULTURE: NO GROWTH
Culture: NO GROWTH
SPECIAL REQUESTS: ADEQUATE
SPECIAL REQUESTS: ADEQUATE

## 2017-09-12 LAB — GLUCOSE, CAPILLARY
Glucose-Capillary: 111 mg/dL — ABNORMAL HIGH (ref 65–99)
Glucose-Capillary: 162 mg/dL — ABNORMAL HIGH (ref 65–99)
Glucose-Capillary: 124 mg/dL — ABNORMAL HIGH (ref 65–99)
Glucose-Capillary: 123 mg/dL — ABNORMAL HIGH (ref 65–99)

## 2017-09-12 MED ORDER — DARBEPOETIN ALFA 150 MCG/0.3ML IJ SOSY: 150.0000 ug | PREFILLED_SYRINGE | INTRAMUSCULAR | Status: DC

## 2017-09-12 NOTE — Evaluation (Signed)
Physical Therapy Evaluation Patient Details Name: Karen [name] MRN: [id number] DOB: 1976/05/09 Today's Date: 09/12/2017   History of Present Illness  [name] is a 41 yo F with history of ESRD on HD, type 2 diabetes, diabetic neuropathy, PAD status post a left BKA, and hypertension, who was admitted with chest pain.  Patient s/p LUE AVG, and new dysarthria.  Clinical Impression  Patient presents with problems listed below.  Will benefit from acute PT to maximize functional mobility prior to discharge.  Patient required max encouragement to participate with PT.  Difficulty providing PLOF and home information.  Would recommend SNF at d/c for continued therapy prior to return home.    Follow Up Recommendations SNF;Supervision/Assistance - 24 hour    Equipment Recommendations  Rolling walker with 5" wheels    Recommendations for Other Services       Precautions / Restrictions Precautions Precautions: None Restrictions Weight Bearing Restrictions: No      Mobility  Bed Mobility               General bed mobility comments: Patient in chair as PT entered room  Transfers Overall transfer level: Needs assistance Equipment used: Rolling walker (2 wheeled) Transfers: Sit to/from Stand Sit to Stand: Max assist         General transfer comment: Verbal cues to scoot to edge of chair.  Cues for hand placement.  Patient able to boost hips from chair, but unable to fully stand on RLE.  Ambulation/Gait             General Gait Details: NT  Stairs            Wheelchair Mobility    Modified Rankin (Stroke Patients Only) Modified Rankin (Stroke Patients Only) Pre-Morbid Rankin Score: Slight disability Modified Rankin: Severe disability     Balance Overall balance assessment: Needs assistance         Standing balance support: Bilateral upper extremity supported Standing balance-Leahy Scale: Zero                               Pertinent  Vitals/Pain Pain Assessment: Faces Faces Pain Scale: Hurts little more Pain Location: LUE graft site Pain Descriptors / Indicators: Sore Pain Intervention(s): Limited activity within patient's tolerance;Monitored during session    Home Living Family/patient expects to be discharged to:: Private residence Living Arrangements: Children(14yo and 10yo) Available Help at Discharge: Family;Personal care attendant;Available PRN/intermittently(PCA 7 days/week) Type of Home: Apartment       Home Layout: One level Home Equipment: Wheelchair - manual;Shower seat      Prior Function Level of Independence: Independent;Needs assistance   Gait / Transfers Assistance Needed: Patient reports use of prosthesis for gait.  ADL's / Homemaking Assistance Needed: Aide assists with bathing/dressing, housekeeping, meal prep, errands.        Hand Dominance   Dominant Hand: Left    Extremity/Trunk Assessment   Upper Extremity Assessment Upper Extremity Assessment: Generalized weakness;LUE deficits/detail LUE Deficits / Details: LUE AVG LUE: Unable to fully assess due to pain    Lower Extremity Assessment Lower Extremity Assessment: Generalized weakness;LLE deficits/detail LLE Deficits / Details: BKA       Communication   Communication: Expressive difficulties  Cognition Arousal/Alertness: Awake/alert Behavior During Therapy: Flat affect Overall Cognitive Status: No family/caregiver present to determine baseline cognitive functioning  General Comments: Patient drowsy.  Slow processing.      General Comments      Exercises     Assessment/Plan    PT Assessment Patient needs continued PT services  PT Problem List Decreased strength;Decreased activity tolerance;Decreased balance;Decreased mobility;Decreased cognition;Decreased knowledge of use of DME;Obesity;Pain       PT Treatment Interventions DME instruction;Gait training;Functional  mobility training;Therapeutic activities;Therapeutic exercise;Balance training;Patient/family education;Cognitive remediation    PT Goals (Current goals can be found in the Care Plan section)  Acute Rehab PT Goals Patient Stated Goal: None stated PT Goal Formulation: With patient Time For Goal Achievement: 09/19/17 Potential to Achieve Goals: Good    Frequency Min 3X/week   Barriers to discharge Decreased caregiver support Unsure of assistance available at home.    Co-evaluation               AM-PAC PT "6 Clicks" Daily Activity  Outcome Measure Difficulty turning over in bed (including adjusting bedclothes, sheets and blankets)?: A Little Difficulty moving from lying on back to sitting on the side of the bed? : Unable Difficulty sitting down on and standing up from a chair with arms (e.g., wheelchair, bedside commode, etc,.)?: Unable Help needed moving to and from a bed to chair (including a wheelchair)?: A Lot Help needed walking in hospital room?: A Lot Help needed climbing 3-5 steps with a railing? : Total 6 Click Score: 10    End of Session Equipment Utilized During Treatment: Gait belt Activity Tolerance: Patient limited by fatigue Patient left: in chair;with call bell/phone within reach   PT Visit Diagnosis: Unsteadiness on feet (R26.81);Other abnormalities of gait and mobility (R26.89);Muscle weakness (generalized) (M62.81);Pain Pain - Right/Left: Left Pain - part of body: Arm    Time: 3976-7341 PT Time Calculation (min) (ACUTE ONLY): 10 min   Charges:   PT Evaluation $PT Eval Moderate Complexity: 1 Mod     PT G Codes:        Carita Pian. Sanjuana Kava, [facility] Acute Rehab Services Pager [phone]   Despina Pole 09/12/2017, 12:43 PM

## 2017-09-12 NOTE — Evaluation (Signed)
Occupational Therapy Evaluation Patient Details Name: Karen [name] MRN: [id number] DOB: 30-Jan-1976 Today's Date: 09/12/2017    History of Present Illness Karen [name] is a 41 yo F with history of ESRD on HD, type 2 diabetes, diabetic neuropathy, PAD status post a left BKA, and hypertension, who was admitted with chest pain.  Patient s/p LUE AVG, and new dysarthria.   Clinical Impression   Pt not completely forthcoming about her PLOF or home set up. She has an aide that comes daily and helps with ADL and IADL. Pt declined to don prosthesis for a more accurate assessment of her mobility. Became increasingly irritable as session continued. Will follow acutely. Given today's functioning level, recommending SNF for continued therapy.    Follow Up Recommendations  SNF    Equipment Recommendations       Recommendations for Other Services       Precautions / Restrictions Precautions Precautions: Fall Required Braces or Orthoses: (L prosthesis) Restrictions Weight Bearing Restrictions: No      Mobility Bed Mobility               General bed mobility comments: Patient in chair, per NT, pt required 2 person assist  Transfers Overall transfer level: Needs assistance Equipment used: Rolling walker (2 wheeled) Transfers: Sit to/from Stand Sit to Stand: Max assist         General transfer comment: Verbal cues to scoot to edge of chair.  Cues for hand placement.  Patient able to boost hips from chair, but unable to fully stand on R LE and refused to don prosthesis.    Balance Overall balance assessment: Needs assistance   Sitting balance-Leahy Scale: Fair     Standing balance support: Bilateral upper extremity supported Standing balance-Leahy Scale: Zero                             ADL either performed or assessed with clinical judgement   ADL Overall ADL's : Needs assistance/impaired Eating/Feeding: Set up;Sitting Eating/Feeding Details (indicate cue type  and reason): cut food for pt, self fed with L hand Grooming: Wash/dry face;Sitting;Set up   Upper Body Bathing: Minimal assistance;Sitting   Lower Body Bathing: Maximal assistance;Sitting/lateral leans   Upper Body Dressing : Minimal assistance;Sitting   Lower Body Dressing: Maximal assistance;Sitting/lateral leans       Toileting- Clothing Manipulation and Hygiene: Maximal assistance;Sit to/from stand               Vision   Additional Comments: able to see her smart phone, did not reveal whether she has vision impairment at baseline.     Perception     Praxis      Pertinent Vitals/Pain Pain Assessment: Faces Faces Pain Scale: Hurts little more Pain Location: LUE graft site Pain Descriptors / Indicators: Sore Pain Intervention(s): Monitored during session;Repositioned     Hand Dominance Left   Extremity/Trunk Assessment Upper Extremity Assessment Upper Extremity Assessment: Generalized weakness LUE Deficits / Details: LUE AVG LUE: Unable to fully assess due to pain   Lower Extremity Assessment Lower Extremity Assessment: Defer to PT evaluation LLE Deficits / Details: BKA       Communication Communication Communication: Expressive difficulties   Cognition Arousal/Alertness: Awake/alert Behavior During Therapy: Flat affect Overall Cognitive Status: No family/caregiver present to determine baseline cognitive functioning  General Comments: Patient offering little information about her prior or current situation.   General Comments       Exercises     Shoulder Instructions      Home Living Family/patient expects to be discharged to:: Private residence Living Arrangements: Children(14 and 85 y.o.) Available Help at Discharge: Family;Personal care attendant;Available PRN/intermittently([facility] 7 days a week) Type of Home: Apartment       Home Layout: One level               Home Equipment: Wheelchair  - manual;Shower seat   Additional Comments: pt is a poor historian, appearing irritated when asked questions about home set up and PLOF      Prior Functioning/Environment Level of Independence: Independent;Needs assistance  Gait / Transfers Assistance Needed: Patient reports use of prosthesis for gait. ADL's / Homemaking Assistance Needed: Aide assists with bathing/dressing, housekeeping, meal prep, errands.            OT Problem List: Decreased strength;Decreased activity tolerance;Impaired balance (sitting and/or standing);Decreased coordination;Decreased cognition;Decreased safety awareness;Decreased knowledge of use of DME or AE;Obesity;Pain      OT Treatment/Interventions: Self-care/ADL training;DME and/or AE instruction;Therapeutic activities;Cognitive remediation/compensation;Patient/family education;Balance training    OT Goals(Current goals can be found in the care plan section) Acute Rehab OT Goals Patient Stated Goal: None stated OT Goal Formulation: Patient unable to participate in goal setting Time For Goal Achievement: 10/03/17 Potential to Achieve Goals: Fair ADL Goals Pt Will Perform Grooming: with set-up;with supervision;sitting Pt Will Perform Upper Body Bathing: with set-up;with supervision;sitting Pt Will Perform Upper Body Dressing: with set-up;with supervision;sitting Pt Will Transfer to Toilet: with mod assist;bedside commode;stand pivot transfer Pt Will Perform Toileting - Clothing Manipulation and hygiene: with mod assist;sit to/from stand  OT Frequency: Min 2X/week   Barriers to D/C:            Co-evaluation              AM-PAC PT "6 Clicks" Daily Activity     Outcome Measure Help from another person eating meals?: A Little Help from another person taking care of personal grooming?: A Little Help from another person toileting, which includes using toliet, bedpan, or urinal?: A Lot Help from another person bathing (including washing, rinsing,  drying)?: A Lot Help from another person to put on and taking off regular upper body clothing?: A Little Help from another person to put on and taking off regular lower body clothing?: A Lot 6 Click Score: 15   End of Session Equipment Utilized During Treatment: Rolling walker;Gait belt  Activity Tolerance: Patient tolerated treatment well Patient left: in chair;with call bell/phone within reach  OT Visit Diagnosis: Unsteadiness on feet (R26.81);Muscle weakness (generalized) (M62.81);Other symptoms and signs involving cognitive function;Pain Pain - Right/Left: Left Pain - part of body: Arm                Time: 3094-0768 OT Time Calculation (min): 14 min Charges:  OT General Charges $OT Visit: 1 Visit OT Evaluation $OT Eval Moderate Complexity: 1 Mod G-Codes:     10/04/17 [name], OTR/L Pager: [phone] Werner Lean, Haze Boyden October 04, 2017, 1:09 PM

## 2017-09-12 NOTE — Progress Notes (Signed)
  Date: 09/12/2017  Patient name: Karen [name]  Medical record number: [id number]  Date of birth: 08-Mar-1976   I have seen and evaluated this patient and I have discussed the plan of care with the house staff. Please see Dr. Revonda Standard note for complete details. I concur with her findings.  Sid Falcon, MD 09/12/2017, 12:50 PM

## 2017-09-12 NOTE — Progress Notes (Signed)
Subjective: Patient seen and examined. Per overnight nursing note patient declined morning medications and labs this morning. Ms. Radloff states her speech is improved today and reiterated that her speech typically fluctuates with dialysis. She denies difficulty swallowing. She denies chest pain or shortness of breath. States her left arm is still sore, but improving. She got upset and started crying stating that she wants to go home.    Objective:  Vital signs in last 24 hours: Vitals:   09/11/17 1128 09/11/17 2100 09/12/17 0640 09/12/17 0904  BP: (!) 132/94 (!) 173/89 (!) 153/78 (!) 159/98  Pulse: 81 82 76 68  Resp: 18     Temp: 97.7 F (36.5 C) 97.6 F (36.4 C) 98.2 F (36.8 C)   TempSrc: Oral Oral Oral   SpO2: 99% 100% 94%   Weight:   211 lb 10.3 oz (96 kg)   Height:       General: Sitting up on side of bed, NAD, tearful HEENT: [place]/AT, no scleral icterus Cardiac: RRR, No R/M/G appreciated Pulm: normal effort, good aeration with course breath sounds in anterior lung fields Neuro: alert and oriented X3, cranial nerves II-XII grossly intact with mild dysarthria and delayed speech, improved from yesterday; no focal neuro deficits Extremities: Left upper arm mild swelling and erythema, incision clean and dry; palpable thrill; left hand strength 5/5, sensation intact Skin: umbilicated, hyperkeratotic papules, some with a central, white crust diffusely scattered on patients arms and legs    Assessment/Plan:  Principal Problem:   Chest pain Active Problems:   Essential hypertension   End-stage renal disease on hemodialysis (HCC)   Obesity  Dysarthria associated with HD MRI abnormalities Patient had acute worsening of dysarthria and became very lethargic after dialysis, which was noted after previous HD sessions, but was not as severe as when seen yesterday afternoon. CT head was obtained and was negative. Follow up MRI showed: subcentimeter area of acute subcortical white  matter infarction, RIGHT posterior temporal lobe;  Low-level diffusion signal abnormality in the RIGHT cingulate gyrus, possibly representing a subacute infarct; more concerning was central pontine area of T2 hyperintensity (butterfly shaped), which could represent early central pontine myelinolysis. Discussed case with Neuro, Dr. Cheral Marker. MRI images characteristic of CPM; there is literature regarding CPM and ESRD pt on HD. Patient's do not have to have hyponatremia and fluid shifts can be responsible for CPM. CPM can also cause extrapontine in CPM, which may explain the other MRI findings. Given the patients history of intermittent dysarthria and lethargy associated with HD, less likely acute infarct. There has also been literature that did follow up MRIs of patients who developed osmotic demyelination syndrome after HD, and the patient's lesions had completely resolved, suggesting a transient rather than chronic process.   -Neurology on board, appreciate recs - patient may not need more imaging per discussion this morning will follow up formal consult note -ECHO scheduled  -US Carotids scheduled -Osmolality prior and after dialysis session yesterday was WNL  -Nephro on board, appreciate recs   T2DM Patient has been declining all meds. But BG has been at inpatient goals thus far. No other episodes of hypoglycemia.  -Will keep lantus 8 units qhs ordered for now -Will continue to monitor  -SSI -CBG monitoring   Hypertension Consistently elevated, 159/98 this AM. Patient declined evening meds again.  -Amlodipine to 10 mg qhs   ESRD on HD Post op Day 2 upper arm AV graft. Site looks clean and dry, mild swelling consistent with post op;  palpable thrill  -Nephro managing HD, appreciate recs -No pain meds ordered as patient was lethargic and dysarthric yesterday, but pain seems well controlled with tylenol   Rash, Hyperkeratotic papules Chronic, patient has had them since her left BKA several  years ago. 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  -Asymptomatic, had to d/c benadryl due to lethargy    Dispo: Anticipated discharge in approximately 1-2 day(s) pending neuro work up.   Melanee Spry, MD 09/12/2017, 9:24 AM Pager: [phone]

## 2017-09-12 NOTE — Progress Notes (Signed)
  Echocardiogram 2D Echocardiogram has been performed.  Johny Chess 09/12/2017, 3:23 PM

## 2017-09-12 NOTE — Progress Notes (Signed)
[facility] Progress Note   Progress Note  Dialysis Orders:TTS Ash 4 hr 2 K 2.25 Ca 400/800 EDW 94kg heparin 6000 to 2 K mid tmt right IJ Mircera 200 last 12/13 Fe q HD through 12/25 sensipar q HD calcitriol 1.25  Assessment/Plan: 1.Chest pain RLL lung opacity/pleural effusion- on Maxepime/Vanc - not convincing for PNA; coughing much more today while taking liquids 12/22 worse after surgery 2. ESRD- TTS; s/p LUE AVG 12/21, appreciate help of VVS; tight heparin K 4.9- next HD Monday due to holiday schedule; discussed with nursing not to give OJ due to ESRD- also getting with FL  3. Anemia- hgb stable- not due for ESA until 12/27-- continue q HD Fe  4. Secondary hyperparathyroidism- Ca 8.0- continue sensipar TTS, calcitriol q HD and P 6.8- not getting routinely due swallowing issues - ok to hold for now  5.HTN/volume- BP better with 10 amlodipine; hypervolemic 2/2 inadequate UF at HD from s/o / compliance.continue totitrate EDW down while here for better BP control; net UF 3 L Saturday post wt bedscale 93.6 6. Nutrition- alb 3.1on FL-per ST- added fluid restriction and no OJ to diet order 7. Swallowing dysfunction/lethargy/some dysarthria -  bedside swallow with deficits - see note; neg head CT; MRI - ?  subacute infarct and subcentimeter area of acute subcortical infarct R post temporal lobe - no hemorrhage- if she has a repeat MRI - it needs to be WITHOUT contrast; gadolinium is contraindicated in ESRD; got girts on FL - not sure if that is what is causing her coughing today- swallowing study said problems with puree; d/w primary - Neuro will be consulted  Myriam Jacobson, PA-C [place] [phone] 09/12/2017,8:58 AM  LOS: 5 days   Subjective:   My mother is coming today. Wants food (not just liquids). Drinking lots of liquids 1 40 oz grape juice and 3 4 oz OJ including other foods on tray  Objective Vitals:   09/11/17 1100 09/11/17  1128 09/11/17 2100 09/12/17 0640  BP: (!) 150/70 (!) 132/94 (!) 173/89 (!) 153/78  Pulse: 88 81 82 76  Resp: 18 18    Temp: 98 F (36.7 C) 97.7 F (36.5 C) 97.6 F (36.4 C) 98.2 F (36.8 C)  TempSrc: Oral Oral Oral Oral  SpO2: 96% 99% 100% 94%  Weight: 93.6 kg (206 lb 5.6 oz)   96 kg (211 lb 10.3 oz)  Height:       Physical Exam General: sitting up in bed - occ severe coughing-  Heart: RRR Lungs: dim BS poor expansion/effort Abdomen: obese soft NT Extremities: no LE edema Dialysis Access:  New left upper AVG + bruit" sore" and right IJ   Additional Objective Labs: Basic Metabolic Panel: Recent Labs  Lab 09/09/17 0710 09/10/17 0422 09/11/17 0737  NA 134* 134*  133* 134*  K 5.4* 4.0  4.1 4.9  CL 100* 97*  97* 99*  CO2 22 26  25 24   GLUCOSE 88 64*  62* 103*  BUN 53* 25*  24* 34*  CREATININE 11.07* 7.68*  7.65* 9.38*  CALCIUM 8.5* 8.5*  8.6* 8.0*  PHOS  --  4.6 6.8*   Liver Function Tests: Recent Labs  Lab 09/07/17 0802 09/10/17 0422 09/11/17 0737  AST 14*  --   --   ALT 11*  --   --   ALKPHOS 74  --   --   BILITOT 0.8  --   --   PROT 8.1  --   --  ALBUMIN 3.7 3.2* 3.1*   Recent Labs  Lab 09/07/17 0802  LIPASE 21   CBC: Recent Labs  Lab 09/07/17 0802 09/09/17 0710 09/10/17 0422 09/11/17 0834  WBC 6.7 5.0 4.9 7.1  NEUTROABS 4.4  --   --   --   HGB 11.3* 11.2* 11.6* 10.7*  HCT 36.9 36.6 37.7 34.6*  MCV 89.6 89.5 90.4 89.6  PLT 213 172 129* 132*   Blood Culture    Component Value Date/Time   SDES BLOOD RIGHT ANTECUBITAL 09/07/2017 1830   SPECREQUEST IN PEDIATRIC BOTTLE Blood Culture adequate volume 09/07/2017 1830   CULT NO GROWTH 4 DAYS 09/07/2017 1830   REPTSTATUS PENDING 09/07/2017 1830    Cardiac Enzymes: Recent Labs  Lab 09/07/17 0802  TROPONINI 0.06*   CBG: Recent Labs  Lab 09/10/17 2122 09/11/17 1132 09/11/17 1722 09/11/17 2126 09/12/17 0758  GLUCAP 125* 118* 112* 124* 111*   Iron Studies: No results for  input(s): IRON, TIBC, TRANSFERRIN, FERRITIN in the last 72 hours. Lab Results  Component Value Date   INR 1.15 02/08/2013   Studies/Results: Ct Head Wo Contrast  Result Date: 09/11/2017 CLINICAL DATA:  Dysarthria and drowsiness today. EXAM: CT HEAD WITHOUT CONTRAST TECHNIQUE: Contiguous axial images were obtained from the base of the skull through the vertex without intravenous contrast. COMPARISON:  None. FINDINGS: Brain: No evidence of acute infarction, hemorrhage, hydrocephalus, extra-axial collection or mass lesion/mass effect. Vascular: Atherosclerosis noted. Skull: Intact. Sinuses/Orbits: Negative. Other: None. IMPRESSION: No acute abnormality. Atherosclerosis. Electronically Signed   By: Inge Rise M.D.   On: 09/11/2017 17:12   Mr Brain Wo Contrast  Result Date: 09/11/2017 CLINICAL DATA:  Speech difficulty. Drowsiness. S/P placement of LEFT arm AV fistula 09/10/2017. EXAM: MRI HEAD WITHOUT CONTRAST TECHNIQUE: Multiplanar, multiecho pulse sequences of the brain and surrounding structures were obtained without intravenous contrast. COMPARISON:  CT head earlier today. FINDINGS: The patient completed diffusion imaging, sagittal T1, axial T2 series and refused further scanning. The examination is of limited diagnostic accuracy. Brain: Small focus of restricted diffusion, 4 mm, low ADC, consistent with acute infarction, RIGHT posterior temporal subcortical white matter, MCA territory. No other areas of acute infarction are seen. No hemorrhage is associated. There is low-level signal abnormality on diffusion-weighted imaging, RIGHT anterior cingulate gyrus, without corresponding ADC reduction. This could represent a subacute infarct. Further characterization is hampered by the fact the patient refused additional imaging. Generalized atrophy. Mild to moderate small vessel disease. Unusual central area of T2 hyperintensity in the mid pons, butterfly shape, no restriction, extending slightly more to  the RIGHT. This is surrounded with what is likely chronic ischemic demyelination in the pontine fibers. In the appropriate clinical setting, this appearance could represent early central pontine myelinolysis. Pontine infarction does not usually cross the midline. Vascular: Major flow voids are preserved. Skull and upper cervical spine: No concerning marrow signal abnormalities. Sinuses/Orbits: Negative orbits.No layering fluid in the sinus. Other: None. IMPRESSION: Prematurely truncated exam is of limited diagnostic accuracy. Subcentimeter area of acute subcortical white matter infarction, RIGHT posterior temporal lobe. No hemorrhage. Low-level diffusion signal abnormality in the RIGHT cingulate gyrus, with normalized ADC could represent a subacute infarct. 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 Continued surveillance warranted. Findings discussed with Dr. Jari Favre. When the patient is better able cooperate recommend repeat MRI brain without with contrast. Electronically Signed   By: Staci Righter M.D.   On: 09/11/2017 20:32   Medications: . sodium chloride    .  ferric gluconate (FERRLECIT/NULECIT) IV Stopped (09/11/17 1131)   . amLODipine  10 mg Oral QHS  . calcitRIOL  1.5 mcg Oral Q T,Th,Sa-HD  . calcium acetate  1,334 mg Oral TID WC  . Chlorhexidine Gluconate Cloth  6 each Topical q morning - 10a  . cinacalcet  30 mg Oral Q T,Th,Sa-HD  . [START ON 09/16/2017] darbepoetin (ARANESP) injection - DIALYSIS  150 mcg Intravenous Q Thu-HD  . furosemide  20 mg Oral Daily  . heparin  5,000 Units Subcutaneous Q8H  . insulin aspart  0-9 Units Subcutaneous TID WC  . insulin glargine  8 Units Subcutaneous QHS  . metoprolol succinate  25 mg Oral Daily  . multivitamin  1 tablet Oral QHS  . mupirocin ointment  1 application Nasal BID

## 2017-09-12 NOTE — Evaluation (Signed)
Speech Language Pathology Evaluation Patient Details Name: Karen [name] MRN: [id number] DOB: 09-Aug-1976 Today's Date: 09/12/2017 Time: 1025-8527 SLP Time Calculation (min) (ACUTE ONLY): 12 min  Problem List:  Patient Active Problem List   Diagnosis Date Noted  . Obesity 09/09/2017  . Chest pain 09/07/2017  . Fluid overload 10/03/2016  . Hyperkalemia 10/03/2016  . Hypertension with fluid overload 10/03/2016  . Anemia in other chronic diseases classified elsewhere 09/08/2016  . High anion gap metabolic acidosis 78/24/2353  . Right lower lobe pneumonia ([place]) 09/08/2016  . Uncontrolled type 2 diabetes mellitus with chronic kidney disease on chronic dialysis, with long-term current use of insulin ([place]) 01/31/2016  . Noncompliance with medications   . End-stage renal disease on hemodialysis ([place])   . Hx of BKA ([place]) 07/03/2013  . S/P BKA (below knee amputation) unilateral ([place]) 06/30/2013  . Physical deconditioning 06/30/2013  . Cardiomyopathy, ischemic - EF 45-50% with inf WMA by 2D 02/05/13 02/06/2013  . Essential hypertension 02/05/2013  . PAD (peripheral artery disease) ([place]) 02/05/2013  . Diabetic neuropathy ([place])   . Osteomyelitis of left great toe - S/P amputation 02/06/13 04/21/2011   Past Medical History:  Past Medical History:  Diagnosis Date  . Anemia   . Anxiety   . Dyspnea    "when I dont go to dialysis"  . Eczema   . ESRD (end stage renal disease) ([place])    Hemo - TTHSAT- [facility]  . Exertional shortness of breath    "recently; it's fluid" (02/02/2013)  . GERD (gastroesophageal reflux disease)   . Headache   . History of blood transfusion    "last week" (02/02/2013)  . History of kidney stones    passed  . Hypertension   . Ischemic cardiomyopathy    by echo 2014  . Neuropathy   . Neuropathy   . Osteomyelitis of toe of left foot ([place])    "off and on since 2009; no OR" (02/02/2013)  . Pneumonia 09/2016  . Type II diabetes mellitus ([place]) 1995   Past Surgical  History:  Past Surgical History:  Procedure Laterality Date  . AMPUTATION Left 02/06/2013   Procedure: AMPUTATION LEFT GREAT TOE;  Surgeon: Wylene Simmer, MD;  Location: [facility];  Service: Orthopedics;  Laterality: Left;  . AMPUTATION Left 06/24/2013   Procedure: AMPUTATION BELOW KNEE ;  Surgeon: Wylene Simmer, MD;  Location: [facility];  Service: Orthopedics;  Laterality: Left;  . AV FISTULA PLACEMENT Right 02/08/2017   Procedure: CREATION OF RIGHT ARM  BASILIC VEIN TO BRACHIAL ARTERY ARTERIOVENOUS (AV) FISTULA;  Surgeon: Rosetta Posner, MD;  Location: [facility];  Service: Vascular;  Laterality: Right;  . BASCILIC VEIN TRANSPOSITION Right 04/14/2017   Procedure: BASCILIC VEIN TRANSPOSITION-RIGHT 2ND STAGE;  Surgeon: Rosetta Posner, MD;  Location: [facility];  Service: Vascular;  Laterality: Right;  . CESAREAN SECTION  10/18/2006  . INSERTION OF DIALYSIS CATHETER     HPI:  Ms. Abrell is a 41 yo Fwith history of ESRD on HD, type 2 diabetes, diabetic neuropathy, PAD status post a left BKA, and hypertension, who was admitted with chest pain. CXR showed RLL opacity/pleural effusion; per MD low suspicion for PNA (afebrile, WBC normal). Evaluated by vascular surgery and s/p left arm AV graft. Pt swallowing evaluated yesterday and dysarthria noted. MRI 09/11/17 showed Subcentimeter area of acute subcortical white matter infarction, RIGHT posterior temporal lobe. Central pontine area of T2 hyperintensity, differential early central pontine myelinolysis, MD questioning transient vs chronic.   Assessment / Plan /  Recommendation Clinical Impression  Patient presents with mild-moderate dysarthria (intelligibility 80% for trained listener) and cognitive communication impairment with noted deficits in attention, safety awareness, judgment. Uncertain of pt's baseline function; no family members present to confirm and pt does not wish for family to be contacted. Pt lives alone with her children but does have a personal care aid 7 days a  week. Pt with flat affect and decreased motivation/willingness to participate in full cognitive assessment. Pt more alert, with improved dysarthria compared with yesterday's assessment, however her sustained attention is notably impaired. Recommend ST services in next venue of care to maximize cognitive function, safety and communication. Conferred with PT; feel SNF would be appropriate. SLP will continue to follow acutely for ongoing assessment of cognition.      SLP Assessment  SLP Recommendation/Assessment: Patient needs continued Speech Lanaguage Pathology Services SLP Visit Diagnosis: Dysarthria and anarthria (R47.1);Cognitive communication deficit (R41.841)    Follow Up Recommendations  Skilled Nursing facility    Frequency and Duration min 1 x/week  1 week      SLP Evaluation Cognition  Overall Cognitive Status: No family/caregiver present to determine baseline cognitive functioning Arousal/Alertness: Awake/alert Orientation Level: Oriented X4 Attention: Focused;Sustained Focused Attention: Appears intact Sustained Attention: Impaired Sustained Attention Impairment: Verbal basic;Functional basic Memory: (limited assessment decreased participation) Awareness: Impaired Awareness Impairment: Intellectual impairment Behaviors: Poor frustration tolerance;Impulsive Safety/Judgment: Impaired       Comprehension  Auditory Comprehension Overall Auditory Comprehension: Appears within functional limits for tasks assessed Yes/No Questions: Within Functional Limits Commands: Within Functional Limits(2 step) Conversation: Simple Interfering Components: Attention Visual Recognition/Discrimination Discrimination: Within Function Limits Reading Comprehension Reading Status: Not tested    Expression Expression Primary Mode of Expression: Verbal Verbal Expression Overall Verbal Expression: Appears within functional limits for tasks assessed Automatic Speech: Name;Social  Response Level of Generative/Spontaneous Verbalization: Sentence Naming: Not tested Pragmatics: Impairment Impairments: Abnormal affect;Eye contact Interfering Components: Attention Non-Verbal Means of Communication: Not applicable Written Expression Dominant Hand: Left(pt reports left-handed) Written Expression: Not tested   Oral / Motor  Oral Motor/Sensory Function Overall Oral Motor/Sensory Function: Mild impairment Facial ROM: Within Functional Limits Facial Symmetry: Within Functional Limits Facial Strength: Within Functional Limits Facial Sensation: Within Functional Limits Lingual ROM: Within Functional Limits Lingual Symmetry: Within Functional Limits Lingual Strength: Reduced Lingual Sensation: Within Functional Limits Velum: Within Functional Limits Mandible: Within Functional Limits Motor Speech Overall Motor Speech: Impaired Respiration: Within functional limits Phonation: Low vocal intensity Resonance: Within functional limits Articulation: Impaired Level of Impairment: Phrase Intelligibility: Intelligibility reduced Word: 75-100% accurate Phrase: 75-100% accurate(85%) Sentence: 75-100% accurate(80%) Conversation: Not tested Motor Planning: Witnin functional limits Motor Speech Errors: Consistent Interfering Components: Premorbid status Effective Techniques: Over-articulate;Increased vocal intensity   GO                   Deneise Lever, [place], CCC-SLP Speech-Language Pathologist [phone]  Aliene Altes 09/12/2017, 11:32 AM

## 2017-09-12 NOTE — Progress Notes (Signed)
Patient refused lab draw this AM.

## 2017-09-12 NOTE — Consult Note (Signed)
NEURO HOSPITALIST CONSULT NOTE   Requestig physician: Dr. Daryll Drown  Reason for Consult: Dysarthria worsened after dialysis  History obtained from:  Patient   Chart   Dr Cheral Marker initially reviewed the chart and made some recommendations, but was not able to see the patient and case handed over to me.   HPI:   Karen [name] is an 41 y.o. female AA with PMH of ESRD on HD, T2DM, diabetic neuropathy, PAD s/p L BKA, and HTN who is here after experiencing chest and back pain at her dialysis center. She normally has fluctuating dysarthria with dialysis. On 12/22 it appears patient was severely dysarthric and dysphagic on eval by Speech pathologist. MRI brain was done and neurology consulted for further recommendations.    MRI brain  revealing the following: 1. Subcentimeter area of acute subcortical white matter infarction, RIGHT posterior temporal lobe. No hemorrhage. On my review of the images, this may also represent a focus of acute osmotic demyelination. 2. Low-level diffusion signal abnormality in the RIGHT cingulate gyrus, with normalized ADC could represent a subacute infarct. See #1, above regarding possible acute osmotic demyelination as an alternate explanation.  3. 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 On my review of the images, this appears to be late subacute. 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   Past Medical History:  Diagnosis Date  . Anemia   . Anxiety   . Dyspnea    "when I dont go to dialysis"  . Eczema   . ESRD (end stage renal disease) ([place])    Hemo - TTHSAT- [place]  . Exertional shortness of breath    "recently; it's fluid" (02/02/2013)  . GERD (gastroesophageal reflux disease)   . Headache   . History of blood transfusion    "last week" (02/02/2013)  . History of kidney stones    passed  . Hypertension   . Ischemic cardiomyopathy    by echo 2014  .  Neuropathy   . Neuropathy   . Osteomyelitis of toe of left foot ([place])    "off and on since 2009; no OR" (02/02/2013)  . Pneumonia 09/2016  . Type II diabetes mellitus ([place]) 1995    Past Surgical History:  Procedure Laterality Date  . AMPUTATION Left 02/06/2013   Procedure: AMPUTATION LEFT GREAT TOE;  Surgeon: Wylene Simmer, MD;  Location: [facility];  Service: Orthopedics;  Laterality: Left;  . AMPUTATION Left 06/24/2013   Procedure: AMPUTATION BELOW KNEE ;  Surgeon: Wylene Simmer, MD;  Location: [facility];  Service: Orthopedics;  Laterality: Left;  . AV FISTULA PLACEMENT Right 02/08/2017   Procedure: CREATION OF RIGHT ARM  BASILIC VEIN TO BRACHIAL ARTERY ARTERIOVENOUS (AV) FISTULA;  Surgeon: Rosetta Posner, MD;  Location: [facility];  Service: Vascular;  Laterality: Right;  . BASCILIC VEIN TRANSPOSITION Right 04/14/2017   Procedure: BASCILIC VEIN TRANSPOSITION-RIGHT 2ND STAGE;  Surgeon: Rosetta Posner, MD;  Location: [facility];  Service: Vascular;  Laterality: Right;  . CESAREAN SECTION  10/18/2006  . INSERTION OF DIALYSIS CATHETER      Family History  Problem Relation Age of Onset  . Hypertension Mother   . Diabetes Mother   . Diabetes Father       Social History:  reports that she quit smoking about 12 years ago. Her smoking use included cigarettes. She  has a 30.00 pack-year smoking history. she has never used smokeless tobacco. She reports that she does not drink alcohol or use drugs.  Allergies  Allergen Reactions  . Penicillins Anaphylaxis, Hives, Rash and Other (See Comments)    PATIENT HAD A PCN REACTION WITH IMMEDIATE RASH, FACIAL/TONGUE/THROAT SWELLING, SOB, OR LIGHTHEADEDNESS WITH HYPOTENSION:  #  #  #  YES  #  #  #   SEVERE RASH INVOLVING MUCUS MEMBRANES or SKIN NECROSIS: #  #  #  YES  #  #  # Has patient had a PCN reaction that required hospitalization No Has patient had a PCN reaction occurring within the last 10 years: No If all of the above answers are "NO", then may proceed with  Cephalosporin use.  09/10/16- tolerated Cefepime   . Morphine And Related Hives and Rash  . Novolog [Insulin Aspart] Other (See Comments)    Cramps/ Gi distress    MEDICATIONS:                                                                                                                     reviewed  ROS:                                                                                                                                       14 systems reviewed and negativer   Blood pressure (!) 159/98, pulse 68, temperature 98.2 F (36.8 C), temperature source Oral, resp. rate 18, height 5\' 6"  (1.676 m), weight 96 kg (211 lb 10.3 oz), SpO2 94 %.   Neurological Examination Mental Status: Alert, oriented, thought content appropriate.  Speech dysarthric.  Able to follow 3 step commands without difficulty. Cranial Nerves: II:  Visual fields grossly normal in R eye, blind in left eye  III,IV, VI: ptosis not present, extra-ocular motions intact bilaterally pupils equal, round, reactive to light and accommodation V,VII: smile symmetric, facial light touch sensation normal bilaterally VIII: hearing normal bilaterally IX,X: uvula rises symmetrically XI: bilateral shoulder shrug XII: midline tongue extension Motor: Right : Upper extremity   5/5    Left:     Upper extremity   AKA  Lower extremity   5/5     Lower extremity   4+/5 Tone and bulk:normal tone throughout; no atrophy noted Sensory: Pinprick and light touch intact throughout, bilaterally Deep Tendon Reflexes: 2+ and symmetric throughout Plantars: Right: downgoing   Left:  downgoing Cerebellar: normal finger-to-nose in both hands Gait: did not assess      Lab Results: Basic Metabolic Panel: Recent Labs  Lab 09/07/17 0802 09/09/17 0710 09/10/17 0422 09/11/17 0737  NA 136 134* 134*  133* 134*  K 5.5* 5.4* 4.0  4.1 4.9  CL 101 100* 97*  97* 99*  CO2 20* 22 26  25 24   GLUCOSE 151* 88 64*  62* 103*  BUN 73* 53* 25*   24* 34*  CREATININE 13.19* 11.07* 7.68*  7.65* 9.38*  CALCIUM 8.8* 8.5* 8.5*  8.6* 8.0*  MG 2.6*  --   --   --   PHOS  --   --  4.6 6.8*    Liver Function Tests: Recent Labs  Lab 09/07/17 0802 09/10/17 0422 09/11/17 0737  AST 14*  --   --   ALT 11*  --   --   ALKPHOS 74  --   --   BILITOT 0.8  --   --   PROT 8.1  --   --   ALBUMIN 3.7 3.2* 3.1*   Recent Labs  Lab 09/07/17 0802  LIPASE 21   No results for input(s): AMMONIA in the last 168 hours.  CBC: Recent Labs  Lab 09/07/17 0802 09/09/17 0710 09/10/17 0422 09/11/17 0834  WBC 6.7 5.0 4.9 7.1  NEUTROABS 4.4  --   --   --   HGB 11.3* 11.2* 11.6* 10.7*  HCT 36.9 36.6 37.7 34.6*  MCV 89.6 89.5 90.4 89.6  PLT 213 172 129* 132*    Cardiac Enzymes: Recent Labs  Lab 09/07/17 0802  TROPONINI 0.06*    Lipid Panel: No results for input(s): CHOL, TRIG, HDL, CHOLHDL, VLDL, LDLCALC in the last 168 hours.  CBG: Recent Labs  Lab 09/10/17 2122 09/11/17 1132 09/11/17 1722 09/11/17 2126 09/12/17 0758  GLUCAP 125* 118* 112* 124* 111*    Microbiology: Results for orders placed or performed during the hospital encounter of 09/07/17  Blood culture (routine x 2)     Status: None (Preliminary result)   Collection Time: 09/07/17  9:27 AM  Result Value Ref Range Status   Specimen Description BLOOD RIGHT ANTECUBITAL  Final   Special Requests   Final    BOTTLES DRAWN AEROBIC AND ANAEROBIC Blood Culture adequate volume   Culture NO GROWTH 4 DAYS  Final   Report Status PENDING  Incomplete  Blood culture (routine x 2)     Status: None (Preliminary result)   Collection Time: 09/07/17  6:30 PM  Result Value Ref Range Status   Specimen Description BLOOD RIGHT ANTECUBITAL  Final   Special Requests IN PEDIATRIC BOTTLE Blood Culture adequate volume  Final   Culture NO GROWTH 4 DAYS  Final   Report Status PENDING  Incomplete  Surgical PCR screen     Status: Abnormal   Collection Time: 09/07/17  8:27 PM  Result Value  Ref Range Status   MRSA, PCR POSITIVE (A) NEGATIVE Final    Comment: RESULT CALLED TO, READ BACK BY AND VERIFIED WITH: B SIMPSON RN [phone] 09/08/17 A BROWNING    Staphylococcus aureus POSITIVE (A) NEGATIVE Final    Comment: (NOTE) The Xpert SA Assay (FDA approved for NASAL specimens in patients 32 years of age and older), is one component of a comprehensive surveillance program. It is not intended to diagnose infection nor to guide or monitor treatment.     Coagulation Studies: No results for input(s): LABPROT, INR in the last 72 hours.  Imaging:  Ct Head Wo Contrast  Result Date: 09/11/2017 CLINICAL DATA:  Dysarthria and drowsiness today. EXAM: CT HEAD WITHOUT CONTRAST TECHNIQUE: Contiguous axial images were obtained from the base of the skull through the vertex without intravenous contrast. COMPARISON:  None. FINDINGS: Brain: No evidence of acute infarction, hemorrhage, hydrocephalus, extra-axial collection or mass lesion/mass effect. Vascular: Atherosclerosis noted. Skull: Intact. Sinuses/Orbits: Negative. Other: None. IMPRESSION: No acute abnormality. Atherosclerosis. Electronically Signed   By: Inge Rise M.D.   On: 09/11/2017 17:12   Mr Brain Wo Contrast  Result Date: 09/11/2017 CLINICAL DATA:  Speech difficulty. Drowsiness. S/P placement of LEFT arm AV fistula 09/10/2017. EXAM: MRI HEAD WITHOUT CONTRAST TECHNIQUE: Multiplanar, multiecho pulse sequences of the brain and surrounding structures were obtained without intravenous contrast. COMPARISON:  CT head earlier today. FINDINGS: The patient completed diffusion imaging, sagittal T1, axial T2 series and refused further scanning. The examination is of limited diagnostic accuracy. Brain: Small focus of restricted diffusion, 4 mm, low ADC, consistent with acute infarction, RIGHT posterior temporal subcortical white matter, MCA territory. No other areas of acute infarction are seen. No hemorrhage is associated. There is low-level  signal abnormality on diffusion-weighted imaging, RIGHT anterior cingulate gyrus, without corresponding ADC reduction. This could represent a subacute infarct. Further characterization is hampered by the fact the patient refused additional imaging. Generalized atrophy. Mild to moderate small vessel disease. Unusual central area of T2 hyperintensity in the mid pons, butterfly shape, no restriction, extending slightly more to the RIGHT. This is surrounded with what is likely chronic ischemic demyelination in the pontine fibers. In the appropriate clinical setting, this appearance could represent early central pontine myelinolysis. Pontine infarction does not usually cross the midline. Vascular: Major flow voids are preserved. Skull and upper cervical spine: No concerning marrow signal abnormalities. Sinuses/Orbits: Negative orbits.No layering fluid in the sinus. Other: None. IMPRESSION: Prematurely truncated exam is of limited diagnostic accuracy. Subcentimeter area of acute subcortical white matter infarction, RIGHT posterior temporal lobe. No hemorrhage. Low-level diffusion signal abnormality in the RIGHT cingulate gyrus, with normalized ADC could represent a subacute infarct. 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 Continued surveillance warranted. Findings discussed with Dr. Jari Favre. When the patient is better able cooperate recommend repeat MRI brain without with contrast. Electronically Signed   By: Staci Righter M.D.   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 The shape of the pontine lesion seen on MRI is quite characteristic of this condition. CPM/EPM most likely due to rapid changes in serum osmolality either due to variations occurring at home (possible  precipitants include erratic hydration regimen, beer potomania, polydipsia) or with rapid shifts due to suboptimal dialysis parameters. Her worsened dysarthria following dialysis sessions is more suggestive of the latter.   Recommendations: 1. Nephrology consult for review osmolality calculations from labs drawn before and after dialysis.   Subacute infarcts Possibly for hypotension during dialysis vs cardioembolic. Unclear if this explains her dysarthria and dysphagia.  Recommendations:  # Carotid US #Transthoracic Echo  # Start patient on ASA 325mg  daily #Start or continue Atorvastatin 80 mg/other high intensity statin # BP goal: permissive HTN upto 170 systolic, PRNs above 21 # HBAIC and Lipid profile # Telemetry monitoring # Frequent neuro checks # NPO until passes stroke swallow screen     09/12/2017, 9:25 AM

## 2017-09-12 NOTE — Progress Notes (Signed)
  Speech Language Pathology Treatment: Dysphagia  Patient Details Name: Karen [name] MRN: [id number] DOB: 04-12-1976 Today's Date: 09/12/2017 Time: 0920-0929 SLP Time Calculation (min) (ACUTE ONLY): 9 min  Assessment / Plan / Recommendation Clinical Impression  Pt seen for follow-up for dysphagia. Per RN, pt more alert today and upgraded to soft diet per MD. Pt upright in chair upon ST arrival, initially unwilling to participate however did consume PO when placed in front of her and verbal cues reduced. Intermittently she is still holding solids orally, primarily due to decreased sustained attention, external distractions; with min A visual/verbal cues pt with adequate oral clearance. Airway protection appears adequate even with challenging with 3oz thin liquids via consecutive straw sips. Recommend she continue current soft diet (dys 3) with thin liquids, can offer meds whole in puree or with liquids if pt will accept. Recommend supervision as pt remains at mild risk for aspiration due to cognitive impairment, impulsivity, decreased attention to PO. Will follow briefly for tolerance.    HPI HPI: Karen [name] is a 41 yo Fwith history of ESRD on HD, type 2 diabetes, diabetic neuropathy, PAD status post a left BKA, and hypertension, who was admitted with chest pain. CXR showed RLL opacity/pleural effusion; per MD low suspicion for PNA (afebrile, WBC normal). Evaluated by vascular surgery and s/p left arm AV graft. Pt swallowing evaluated yesterday and dysarthria noted. MRI 09/11/17 showed Subcentimeter area of acute subcortical white matter infarction, RIGHT posterior temporal lobe. Central pontine area of T2 hyperintensity, differential early central pontine myelinolysis, MD questioning transient vs chronic.      SLP Plan  Continue with current plan of care  Patient needs continued Speech Lanaguage Pathology Services    Recommendations  Diet recommendations: Dysphagia 3 (mechanical soft);Thin  liquid Liquids provided via: Cup;Straw Medication Administration: Whole meds with puree Supervision: Patient able to self feed;Full supervision/cueing for compensatory strategies Compensations: Slow rate;Small sips/bites;Minimize environmental distractions                Oral Care Recommendations: Oral care BID Follow up Recommendations: Skilled Nursing facility SLP Visit Diagnosis: Dysphagia, unspecified (R13.10) Plan: Continue with current plan of care       [place], [place], [place] Pathologist [phone]  Aliene Altes 09/12/2017, 11:37 AM

## 2017-09-13 ENCOUNTER — Inpatient Hospital Stay (HOSPITAL_COMMUNITY): Payer: Medicare Other

## 2017-09-13 ENCOUNTER — Encounter (HOSPITAL_COMMUNITY): Payer: Self-pay | Admitting: Vascular Surgery

## 2017-09-13 DIAGNOSIS — N186 End stage renal disease: Secondary | ICD-10-CM

## 2017-09-13 DIAGNOSIS — R0682 Tachypnea, not elsewhere classified: Secondary | ICD-10-CM

## 2017-09-13 DIAGNOSIS — Z992 Dependence on renal dialysis: Secondary | ICD-10-CM

## 2017-09-13 LAB — VAS US CAROTID
LCCADSYS: -78 cm/s
LCCAPDIAS: -15 cm/s
LCCAPSYS: -65 cm/s
LEFT ECA DIAS: -9 cm/s
LEFT VERTEBRAL DIAS: 18 cm/s
Left CCA dist dias: -14 cm/s
Left ICA dist dias: -26 cm/s
Left ICA dist sys: -71 cm/s
Left ICA prox dias: -19 cm/s
Left ICA prox sys: -58 cm/s
RCCADSYS: -77 cm/s
RCCAPDIAS: 16 cm/s
RCCAPSYS: 71 cm/s
RIGHT ECA DIAS: -3 cm/s
RIGHT VERTEBRAL DIAS: 14 cm/s

## 2017-09-13 LAB — CBC
HCT: 34.9 % — ABNORMAL LOW (ref 36.0–46.0)
Hemoglobin: 10.7 g/dL — ABNORMAL LOW (ref 12.0–15.0)
MCH: 27.4 pg (ref 26.0–34.0)
MCHC: 30.7 g/dL (ref 30.0–36.0)
MCV: 89.3 fL (ref 78.0–100.0)
Platelets: 80 10*3/uL — ABNORMAL LOW (ref 150–400)
RBC: 3.91 MIL/uL (ref 3.87–5.11)
RDW: 18.5 % — ABNORMAL HIGH (ref 11.5–15.5)
WBC: 6.1 10*3/uL (ref 4.0–10.5)

## 2017-09-13 LAB — LIPID PANEL
CHOL/HDL RATIO: 2.9 ratio
CHOLESTEROL: 110 mg/dL (ref 0–200)
HDL: 38 mg/dL — ABNORMAL LOW (ref >40)
LDL Cholesterol: 49 mg/dL (ref 0–99)
Triglycerides: 113 mg/dL (ref <150)
VLDL: 23 mg/dL (ref 0–40)

## 2017-09-13 LAB — RENAL FUNCTION PANEL
Albumin: 3.2 g/dL — ABNORMAL LOW (ref 3.5–5.0)
Anion gap: 11 (ref 5–15)
BUN: 37 mg/dL — ABNORMAL HIGH (ref 6–20)
CO2: 25 mmol/L (ref 22–32)
Calcium: 8.6 mg/dL — ABNORMAL LOW (ref 8.9–10.3)
Chloride: 99 mmol/L — ABNORMAL LOW (ref 101–111)
Creatinine, Ser: 9.7 mg/dL — ABNORMAL HIGH (ref 0.44–1.00)
GFR calc Af Amer: 5 mL/min — ABNORMAL LOW (ref >60)
GFR calc non Af Amer: 4 mL/min — ABNORMAL LOW (ref >60)
Glucose, Bld: 108 mg/dL — ABNORMAL HIGH (ref 65–99)
Phosphorus: 7.5 mg/dL — ABNORMAL HIGH (ref 2.5–4.6)
Potassium: 4.4 mmol/L (ref 3.5–5.1)
Sodium: 135 mmol/L (ref 135–145)

## 2017-09-13 LAB — GLUCOSE, CAPILLARY
Glucose-Capillary: 161 mg/dL — ABNORMAL HIGH (ref 65–99)
Glucose-Capillary: 227 mg/dL — ABNORMAL HIGH (ref 65–99)
Glucose-Capillary: 124 mg/dL — ABNORMAL HIGH (ref 65–99)

## 2017-09-13 LAB — HEMOGLOBIN A1C
HEMOGLOBIN A1C: 8.1 % — AB (ref 4.8–5.6)
MEAN PLASMA GLUCOSE: 185.77 mg/dL

## 2017-09-13 MED ORDER — LIDOCAINE HCL (PF) 1 % IJ SOLN: 5.0000 mL | INTRAMUSCULAR | Status: DC | PRN

## 2017-09-13 MED ORDER — SODIUM CHLORIDE 0.9 % IV SOLN: 100.0000 mL | INTRAVENOUS | Status: DC | PRN

## 2017-09-13 MED ORDER — ASPIRIN 325 MG PO TABS
ORAL_TABLET | ORAL | Status: AC
  Filled 2017-09-13: qty 1

## 2017-09-13 MED ORDER — HEPARIN SODIUM (PORCINE) 1000 UNIT/ML DIALYSIS: 1000.0000 [IU] | INTRAMUSCULAR | Status: DC | PRN

## 2017-09-13 MED ORDER — PENTAFLUOROPROP-TETRAFLUOROETH EX AERO: 1.0000 "application " | INHALATION_SPRAY | CUTANEOUS | Status: DC | PRN

## 2017-09-13 MED ORDER — RAMELTEON 8 MG PO TABS
4.0000 mg | ORAL_TABLET | Freq: Once | ORAL | Status: AC
  Administered 2017-09-14: 4 mg via ORAL
  Filled 2017-09-13: qty 1

## 2017-09-13 MED ORDER — ALTEPLASE 2 MG IJ SOLR: 2.0000 mg | Freq: Once | INTRAMUSCULAR | Status: DC | PRN

## 2017-09-13 MED ORDER — SODIUM CHLORIDE 0.9 % IV SOLN
125.0000 mg | INTRAVENOUS | Status: AC
  Administered 2017-09-13: 125 mg via INTRAVENOUS
  Filled 2017-09-13: qty 10

## 2017-09-13 MED ORDER — INSULIN GLARGINE 100 UNIT/ML ~~LOC~~ SOLN
10.0000 [IU] | Freq: Every day | SUBCUTANEOUS | Status: DC
  Administered 2017-09-13 – 2017-09-14 (×2): 10 [IU] via SUBCUTANEOUS
  Filled 2017-09-13 (×4): qty 0.1

## 2017-09-13 MED ORDER — ATORVASTATIN CALCIUM 80 MG PO TABS
80.0000 mg | ORAL_TABLET | Freq: Every day | ORAL | Status: DC
  Administered 2017-09-13 – 2017-09-14 (×2): 80 mg via ORAL
  Filled 2017-09-13 (×3): qty 1

## 2017-09-13 MED ORDER — LIDOCAINE-PRILOCAINE 2.5-2.5 % EX CREA: 1.0000 "application " | TOPICAL_CREAM | CUTANEOUS | Status: DC | PRN

## 2017-09-13 MED ORDER — ASPIRIN 325 MG PO TABS
325.0000 mg | ORAL_TABLET | Freq: Every day | ORAL | Status: DC
  Administered 2017-09-13 – 2017-09-15 (×3): 325 mg via ORAL
  Filled 2017-09-13 (×2): qty 1

## 2017-09-13 MED ORDER — HEPARIN SODIUM (PORCINE) 1000 UNIT/ML DIALYSIS: 20.0000 [IU]/kg | INTRAMUSCULAR | Status: DC | PRN

## 2017-09-13 NOTE — Progress Notes (Signed)
   Subjective: Patient seen and examined during Karen [name].  Patient tates her speech continues to improve today. She denies difficulty swallowing or weakness in extremities. She denies chest pain or shortness of breath. States her left arm is still sore. States she is ready to go home.   Discussed with the patient that PT/OT recommended SNF. She was amenable to this and states she knows she needs to get stronger, but she is from [place] and would like to go to a facility close to home.   Objective:  Vital signs in last 24 hours: Vitals:   09/13/17 0830 09/13/17 0900 09/13/17 0930 09/13/17 1000  BP: (!) 170/90 124/72 120/78 112/74  Pulse: 78 80 74 81  Resp:      Temp:      TempSrc:      SpO2:      Weight:      Height:       General: Resting in bed comfortably HEENT: [place]/AT, no scleral icterus Cardiac: RRR, No R/M/G appreciated Pulm: normal effort, CTAB Neuro: alert and oriented X3, cranial nerves II-XII grossly intact with mild dysarthria, somewhat improved from yesterday; no focal neuro deficits Extremities: Left upper arm with mild TTP, incision clean and dry; palpable and audible thrill; left hand strength 5/5, sensation intact Skin: umbilicated, hyperkeratotic papules, some with a central, white crust diffusely scattered on patients arms and legs  Assessment/Plan:  Principal Problem:   Chest pain Active Problems:   Essential hypertension   End-stage renal disease on hemodialysis (HCC)   Obesity  Dysarthria associated with HD MRI abnormalities Patient's dysarthria mildly improved, alert and oriented, not lethargic. 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 Likely due to rapid changes in serum osmolality. Given the patients history of intermittent dysarthria and lethargy associated with HD, less likely acute infarct, but working up for acute  CVA.  ECHO completed and showed no cardiac source of emboli; EF 35-40% with grade 3 diastolic dysfunction, not changed significantly from prior. -Neurology on board -US Carotids scheduled -Will start on high dose aspirin and statin per neuro recs -Hemoglobin and lipid panel pending  -Social work consult for SNF placement   T2DM Patient has been declining all meds. But BG has been at inpatient goals thus far. No other episodes of hypoglycemia.  -Will keep lantus 8 units qhs ordered for now -Will continue to monitor  -SSI -CBG monitoring   Hypertension 170/90 this AM. -Amlodipine to 10 mg qhs   ESRD on HD Post op Day 3 upper arm AV graft. 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  -Asymptomatic and stable   Dispo: Anticipated discharge in approximately 1-2 day(s) pending neuro work up.   Melanee Spry, MD 09/13/2017, 10:08 AM Pager: [phone]
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
## 2017-09-13 NOTE — Progress Notes (Addendum)
Carotid duplex prelim: technically difficult due to patient movement. Bilat 1-39% ICA stenosis. Landry Mellow, RDMS, RVT

## 2017-09-13 NOTE — Progress Notes (Signed)
STROKE TEAM PROGRESS NOTE   HISTORY OF PRESENT ILLNESS (per record) Karen [name] is an 41 y.o. female AA with PMH of ESRD on HD, T2DM, diabetic neuropathy, PAD s/p L BKA, and HTN who is here after experiencing chest and back pain at her dialysis center. She normally has fluctuating dysarthria with dialysis. On 12/22 it appears patient was severely dysarthric and dysphagic on eval by Speech pathologist. MRI brain was done and neurology consulted for further recommendations.   MRI brain  revealing the following: 1. Subcentimeter area of acute subcortical white matter infarction, RIGHT posterior temporal lobe. No hemorrhage. On my review of the images, this may also represent a focus of acute osmotic demyelination. 2. Low-level diffusion signal abnormality in the RIGHT cingulate gyrus, with normalized ADC could represent a subacute infarct. See #1, above regarding possible acute osmotic demyelination as an alternate explanation.  3. 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 On my review of the images, this appears to be late subacute. 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      SUBJECTIVE (INTERVAL HISTORY) Her  Dialysis RN is at the bedside.   Patient appears to be quite restless during dialysis and wants to be left alone.    Home Medications:  Current Meds  Medication Sig  . furosemide (LASIX) 20 MG tablet Take 20 mg by mouth daily.  . insulin glargine (LANTUS) 100 UNIT/ML injection Inject 18 Units into the skin at bedtime.  . metoprolol succinate (TOPROL-XL) 25 MG 24 hr tablet Take 25 mg by mouth daily.  Marland Kitchen oxyCODONE-acetaminophen (PERCOCET/ROXICET) 5-325 MG tablet Take 1-2 tablets by mouth every 6 (six) hours as needed.      Hospital Medications:  Scheduled: . amLODipine  10 mg Oral QHS  . aspirin  325 mg Oral Daily  . atorvastatin  80 mg Oral q1800  . calcitRIOL  1.5 mcg Oral Q T,Th,Sa-HD  .  calcium acetate  1,334 mg Oral TID WC  . cinacalcet  30 mg Oral Q T,Th,Sa-HD  . [START ON 09/16/2017] darbepoetin (ARANESP) injection - DIALYSIS  150 mcg Intravenous Q Thu-HD  . furosemide  20 mg Oral Daily  . heparin  5,000 Units Subcutaneous Q8H  . insulin aspart  0-9 Units Subcutaneous TID WC  . insulin glargine  8 Units Subcutaneous QHS  . metoprolol succinate  25 mg Oral Daily  . multivitamin  1 tablet Oral QHS    OBJECTIVE Temp:  [97.6 F (36.4 C)-98.4 F (36.9 C)] 98.3 F (36.8 C) (12/24 0755) Pulse Rate:  [47-104] 55 (12/24 1145) Cardiac Rhythm: Other (Comment) (12/24 0745) Resp:  [16-18] 18 (12/24 0808) BP: (101-179)/(70-105) 113/82 (12/24 1145) SpO2:  [98 %-100 %] 98 % (12/24 0755) Weight:  [209 lb 10.5 oz (95.1 kg)-213 lb 13.5 oz (97 kg)] 209 lb 10.5 oz (95.1 kg) (12/24 0755)  CBC:  Recent Labs  Lab 09/07/17 0802  09/11/17 0834 09/13/17 0800  WBC 6.7   < > 7.1 6.1  NEUTROABS 4.4  --   --   --   HGB 11.3*   < > 10.7* 10.7*  HCT 36.9   < > 34.6* 34.9*  MCV 89.6   < > 89.6 89.3  PLT 213   < > 132* 80*   < > = values in this interval not displayed.    Basic Metabolic Panel:  Recent Labs  Lab 09/07/17 0802  09/11/17 0737 09/13/17 0800  NA 136   < >  134* 135  K 5.5*   < > 4.9 4.4  CL 101   < > 99* 99*  CO2 20*   < > 24 25  GLUCOSE 151*   < > 103* 108*  BUN 73*   < > 34* 37*  CREATININE 13.19*   < > 9.38* 9.70*  CALCIUM 8.8*   < > 8.0* 8.6*  MG 2.6*  --   --   --   PHOS  --    < > 6.8* 7.5*   < > = values in this interval not displayed.    Lipid Panel: No results found for: CHOL, TRIG, HDL, CHOLHDL, VLDL, LDLCALC HgbA1c:  Lab Results  Component Value Date   HGBA1C 8.1 (H) 09/13/2017   Urine Drug Screen: No results found for: LABOPIA, COCAINSCRNUR, LABBENZ, AMPHETMU, THCU, LABBARB  Alcohol Level No results found for: ETH  IMAGING   Ct Head Wo Contrast 09/11/2017 IMPRESSION:  No acute abnormality. Atherosclerosis.     Mr Brain Wo  Contrast 09/11/2017  IMPRESSION:  Prematurely truncated exam is of limited diagnostic accuracy. Subcentimeter area of acute subcortical white matter infarction, RIGHT posterior temporal lobe. No hemorrhage. Low-level diffusion signal abnormality in the RIGHT cingulate gyrus, with normalized ADC could represent a subacute infarct. 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 Continued surveillance warranted. Findings discussed with Dr. Jari Favre. When the patient is better able cooperate recommend repeat MRI brain without with contrast.     Transthoracic Echocardiogram  09/12/2017 Study Conclusions - Left ventricle: The cavity size was normal. Systolic function was   moderately reduced. The estimated ejection fraction was in the   range of 35% to 40%. Diffuse hypokinesis. Doppler parameters are   consistent with a reversible restrictive pattern, indicative of   decreased left ventricular diastolic compliance and/or increased   left atrial pressure (grade 3 diastolic dysfunction). - Mitral valve: Moderately calcified annulus. Mildly thickened   leaflets . There was mild regurgitation. - Left atrium: The atrium was mildly dilated. Volume/bsa, ES,   (1-plane Simpson&'s, A2C): 32.9 ml/m^2. - Pulmonary arteries: Systolic pressure was mildly increased. PA   peak pressure: 32 mm Hg (S). Impressions: - No cardiac source of emboli was indentified. Compared to the   prior study, there has been no significant interval change.    Bilateral Carotid Dopplers -technically difficult due to patient movement. Bilat 1-39% ICA stenosis       PHYSICAL EXAM Vitals:   09/13/17 1100 09/13/17 1115 09/13/17 1130 09/13/17 1145  BP: 128/77 (!) 141/88 127/75 113/82  Pulse: (!) 104 77 73 (!) 55  Resp:      Temp:      TempSrc:      SpO2:      Weight:      Height:       Frail and malnourished middle-aged African-American lady seems  restless and she is getting hemodialysis. She has left below-knee amputation. There are multiple nodular hyperkeratotic skin lesions over her extremities. Dialysis fistula right upper extremity. Neurological exam. Awake and alert disoriented. Severe dysarthria but can be understood. Follows commands well. Extraocular moments are full range without nystagmus. Blinks to threat bilaterally. Fundi not visualized. Face is symmetric. Tongue midline. Motor system exam able to move both upper and right lower extremity well. Can move left lower extremity well as well. No focal weakness.   ASSESSMENT/PLAN Karen [name] is a 41 y.o. female with history of ESRD on HD, T2DM, diabetic neuropathy,  PAD s/p L BKA, and HTN  presenting with chest pain, back pain, dysarthria, and dysphagia.. She did not receive IV t-PA due to late presentation.  Stroke:  Right posterior temporal lobe infarct - small vessel disease is likely. Abnormal limited MRI showing central pontine white matter hyperintensities possibly osmotic demyelination versus small vessel disease    Resultant  dysarthria   CT head - No acute abnormality.  MRI head - Subcentimeter area of acute subcortical white matter infarction, Rt posterior temporal lobe  MRA head - not performed  Carotid Doppler - pending  2D Echo - EF 35-40%. No cardiac source of emboli identified.  LDL - pending  HgbA1c - 8.1  VTE prophylaxis - subcutaneous heparin Diet renal/carb modified with fluid restriction Diet-HS Snack? Nothing; Room service appropriate? Yes; Fluid consistency: Thin; Fluid restriction: 1200 mL Fluid  No antithrombotic prior to admission, now on aspirin 325 mg daily  Patient counseled to be compliant with her antithrombotic medications  Ongoing aggressive stroke risk factor management  Therapy recommendations:  pending  Disposition:  Pending  Hypertension  Stable  Permissive hypertension (OK if < 220/120) but gradually normalize in 5-7  days  Long-term BP goal normotensive  Hyperlipidemia  Home meds:  No lipid lowering medications prior to admission  LDL pending, goal < 70  Now on Lipitor 80 mg daily  Continue statin at discharge  Diabetes  HgbA1c 8.1, goal < 7.0  Uncontrolled  Other Stroke Risk Factors  Former cigarette smoker - quit 12 years ago  Obesity, Body mass index is 33.84 kg/m., recommend weight loss, diet and exercise as appropriate   Possible TIA history - fluctuating dysarthria with dialysis   Other Active Problems  Anemia  Thrombocytopenia  End-stage renal disease  Possible myelinolysis - repeat MRI Brain with and without contrast recommended   Plan / Recommendations  Hospital day # 6  I have personally examined this patient, reviewed notes, independently viewed imaging studies, participated in medical decision making and plan of care.ROS completed by me personally and pertinent positives fully documented  I have made any additions or clarifications directly to the above note.   She has presented with dysarthria which fluctuates and gets worse during dialysis likely secondary to underlying encephalopathy from renal failure as well as small vessel disease related stroke. MRI does show some white matter hyperintensities in the pons but is limited in all sequences are not obtained hence he does not clear whether she meets the criteria for central pontine myelin lysis or not. Recommend better quality MRI without contrast if patient is cooperative. Aspirin. Family gram daily and Lipitor as well as strict control of hypertension and lipids and sugar. Physical, occupational ,speech therapy. Stroke team will follow. Greater than 50% time during this 35 minute visit was spent on counseling and coordination of care about her dysarthria, stroke and abnormal MRI findings  Antony Contras, MD Medical Director [facility] Pager: [phone] 09/13/2017 3:37 PM   To contact Stroke  Continuity provider, please refer to [email or web address] After hours, contact General Neurology

## 2017-09-13 NOTE — Progress Notes (Signed)
PT Cancellation Note  Patient Details Name: Karen [name] MRN: [id number] DOB: 1976/02/09   Cancelled Treatment:    Reason Eval/Treat Not Completed: Patient at procedure or test/unavailable. Pt at HD. Will check back later as time allows.    [place] 09/13/2017, 11:02 AM

## 2017-09-13 NOTE — Progress Notes (Signed)
PT Cancellation Note  Patient Details Name: Karen [name] MRN: [id number] DOB: [birth year]   Cancelled Treatment:    Reason Eval/Treat Not Completed: Fatigue/lethargy limiting ability to participate. Checked on pt after HD and pt was very groggy and did not feel like she could work with therapy at this time.   [place] 09/13/2017, 2:21 PM

## 2017-09-13 NOTE — Progress Notes (Addendum)
[facility] Progress Note  Subjective: no c/o  Vitals:   09/13/17 0930 09/13/17 1000 09/13/17 1010 09/13/17 1030  BP: 120/78 112/74 101/70 121/71  Pulse: 74 81 (!) 47 76  Resp:      Temp:      TempSrc:      SpO2:      Weight:      Height:        Inpatient medications: . amLODipine  10 mg Oral QHS  . aspirin  325 mg Oral Daily  . atorvastatin  80 mg Oral q1800  . calcitRIOL  1.5 mcg Oral Q T,Th,Sa-HD  . calcium acetate  1,334 mg Oral TID WC  . cinacalcet  30 mg Oral Q T,Th,Sa-HD  . [START ON 09/16/2017] darbepoetin (ARANESP) injection - DIALYSIS  150 mcg Intravenous Q Thu-HD  . furosemide  20 mg Oral Daily  . heparin  5,000 Units Subcutaneous Q8H  . insulin aspart  0-9 Units Subcutaneous TID WC  . insulin glargine  8 Units Subcutaneous QHS  . metoprolol succinate  25 mg Oral Daily  . multivitamin  1 tablet Oral QHS   . sodium chloride    . sodium chloride    . sodium chloride    . ferric gluconate (FERRLECIT/NULECIT) IV 125 mg (09/13/17 1005)   sodium chloride, sodium chloride, acetaminophen **OR** acetaminophen, alteplase, diclofenac sodium, heparin, heparin, lidocaine (PF), lidocaine-prilocaine, ondansetron, pentafluoroprop-tetrafluoroeth  Exam: Alert, coughing spells come and go, no distress Slow speech, Ox 3 No jvd Chest clear bilat RRR no RG Abd soft ntnd Ext no edema, diffuse firm black papular skin lesions on legs R IJ cath / recent LUA AVG+bruit  Dialysis: [facility] 4h  2/2.25   94kg   Hep 6000 w/ [id number]  R IJ cath -mirc 200 last 12/13 -Fe q HD thru 12/25 -sensipar q HD -calc 1.25 ug tiw      Impression: 1.Chest pain RLL lung opacity/pleural effusion- on Maxepime/Vanc - not convincing for PNA 2. ESRD- TTS; s/p LUE AVG 12/21. HD today, holiday sched 3. Anemia- hgb stable- not due for ESA until 12/27-- continue q HD Fe  4. Secondary hyperparathyroidism- Ca 8.0- continue sensipar TTS, calcitriol q HD and P 6.8- not getting  routinely due swallowing issues - ok to hold for now  5.HTN/volume- BP better with 10 amlodipine; hypervolemic 2/2 inadequate UF at HD from s/o / compliance.continue totitrateEDW down while here for better BP control; net UF 3 L Saturday post wt bedscale 93.6 6. Nutrition- alb 3.1on FL-per ST- added fluid restriction and no OJ to diet order 7. Swallowing dysfunction/lethargy/some dysarthria -  bedside swallow with deficits - see note; neg head CT; MRI - ?  subacute infarct and subcentimeter area of acute subcortical infarct R post temporal lobe - no hemorrhage- if she has a repeat MRI - it needs to be WITHOUT contrast; gadolinium is contraindicated in ESRD; not sure if that is what is causing her coughing - swallowing study said problems with puree; d/w primary - Neuro seeing  Plan - HD today, UF 1-2L   Kelly Splinter MD [facility] pager [phone]   09/13/2017, 10:44 AM   Recent Labs  Lab 09/10/17 0422 09/11/17 0737 09/13/17 0800  NA 134*  133* 134* 135  K 4.0  4.1 4.9 4.4  CL 97*  97* 99* 99*  CO2 26  25 24 25   GLUCOSE 64*  62* 103* 108*  BUN 25*  24* 34* 37*  CREATININE 7.68*  7.65* 9.38*  9.70*  CALCIUM 8.5*  8.6* 8.0* 8.6*  PHOS 4.6 6.8* 7.5*   Recent Labs  Lab 09/07/17 0802 09/10/17 0422 09/11/17 0737 09/13/17 0800  AST 14*  --   --   --   ALT 11*  --   --   --   ALKPHOS 74  --   --   --   BILITOT 0.8  --   --   --   PROT 8.1  --   --   --   ALBUMIN 3.7 3.2* 3.1* 3.2*   Recent Labs  Lab 09/07/17 0802  09/10/17 0422 09/11/17 0834 09/13/17 0800  WBC 6.7   < > 4.9 7.1 6.1  NEUTROABS 4.4  --   --   --   --   HGB 11.3*   < > 11.6* 10.7* 10.7*  HCT 36.9   < > 37.7 34.6* 34.9*  MCV 89.6   < > 90.4 89.6 89.3  PLT 213   < > 129* 132* 80*   < > = values in this interval not displayed.   Iron/TIBC/Ferritin/ %Sat    Component Value Date/Time   IRON 31 (L) 02/05/2013 0327   TIBC 155 (L) 02/05/2013 0327   FERRITIN 127 02/05/2013 0327    IRONPCTSAT 20 02/05/2013 0327

## 2017-09-13 NOTE — Progress Notes (Signed)
Patient requesting for some sleep medication, there is no complain of pain or any other issue.  MD was text and she called me back, has put order for patient for ramelteon to be given but only 1/2 a tablet (4 mg) due to her renal impairment.

## 2017-09-13 NOTE — Progress Notes (Signed)
  Date: 09/13/2017  Patient name: Karen [name]  Medical record number: [id number]  Date of birth: 1976-07-21   This patient's plan of care was discussed with the house staff. Please see Dr. Revonda Standard note for complete details. I concur with her findings.   Sid Falcon, MD 09/13/2017, 1:47 PM

## 2017-09-14 ENCOUNTER — Inpatient Hospital Stay (HOSPITAL_COMMUNITY): Payer: Medicare Other

## 2017-09-14 DIAGNOSIS — I10 Essential (primary) hypertension: Secondary | ICD-10-CM

## 2017-09-14 DIAGNOSIS — R471 Dysarthria and anarthria: Secondary | ICD-10-CM

## 2017-09-14 DIAGNOSIS — I6381 Other cerebral infarction due to occlusion or stenosis of small artery: Secondary | ICD-10-CM

## 2017-09-14 DIAGNOSIS — D696 Thrombocytopenia, unspecified: Secondary | ICD-10-CM

## 2017-09-14 LAB — CBC
HEMATOCRIT: 36.1 % (ref 36.0–46.0)
HEMOGLOBIN: 11.1 g/dL — AB (ref 12.0–15.0)
MCH: 27.5 pg (ref 26.0–34.0)
MCHC: 30.7 g/dL (ref 30.0–36.0)
MCV: 89.6 fL (ref 78.0–100.0)
Platelets: 79 10*3/uL — ABNORMAL LOW (ref 150–400)
RBC: 4.03 MIL/uL (ref 3.87–5.11)
RDW: 18.2 % — ABNORMAL HIGH (ref 11.5–15.5)
WBC: 6.7 10*3/uL (ref 4.0–10.5)

## 2017-09-14 LAB — RENAL FUNCTION PANEL
ALBUMIN: 3 g/dL — AB (ref 3.5–5.0)
ANION GAP: 11 (ref 5–15)
BUN: 26 mg/dL — ABNORMAL HIGH (ref 6–20)
CO2: 27 mmol/L (ref 22–32)
Calcium: 8.5 mg/dL — ABNORMAL LOW (ref 8.9–10.3)
Chloride: 94 mmol/L — ABNORMAL LOW (ref 101–111)
Creatinine, Ser: 7.06 mg/dL — ABNORMAL HIGH (ref 0.44–1.00)
GFR calc Af Amer: 8 mL/min — ABNORMAL LOW (ref >60)
GFR, EST NON AFRICAN AMERICAN: 7 mL/min — AB (ref >60)
Glucose, Bld: 207 mg/dL — ABNORMAL HIGH (ref 65–99)
PHOSPHORUS: 5.7 mg/dL — AB (ref 2.5–4.6)
POTASSIUM: 4 mmol/L (ref 3.5–5.1)
Sodium: 132 mmol/L — ABNORMAL LOW (ref 135–145)

## 2017-09-14 LAB — GLUCOSE, CAPILLARY
Glucose-Capillary: 168 mg/dL — ABNORMAL HIGH (ref 65–99)
Glucose-Capillary: 94 mg/dL (ref 65–99)
Glucose-Capillary: 84 mg/dL (ref 65–99)
Glucose-Capillary: 129 mg/dL — ABNORMAL HIGH (ref 65–99)

## 2017-09-14 MED ORDER — LORAZEPAM 0.5 MG PO TABS
0.5000 mg | ORAL_TABLET | ORAL | Status: DC | PRN
  Administered 2017-09-14: 0.5 mg via ORAL
  Filled 2017-09-14: qty 1

## 2017-09-14 MED ORDER — HYDRALAZINE HCL 20 MG/ML IJ SOLN: 5.0000 mg | INTRAMUSCULAR | Status: DC | PRN

## 2017-09-14 MED ORDER — RAMELTEON 8 MG PO TABS
4.0000 mg | ORAL_TABLET | Freq: Once | ORAL | Status: AC
  Administered 2017-09-14: 4 mg via ORAL
  Filled 2017-09-14 (×2): qty 1

## 2017-09-14 NOTE — Progress Notes (Addendum)
  Date: 09/14/2017  Patient name: Karen [name]  Medical record number: [id number]  Date of birth: 05/05/1976   I have seen and evaluated this patient and I have discussed the plan of care with the house staff. Please see Dr. Revonda Standard note for complete details. I concur with her findings with the following additions/corrections:   Will attempt MRI today.  Patient is amenable.  Had discussion with Dr. Leonie Man on rounds about possible etiologies of her waxing and waning dysarthria, confusion.   Also spoke with Dr. Melvia Heaps with Nephrology who thought that CTA brain may be helpful to see if she has intracranial atherosclerosis which may explain her symptoms.  Though likely not intervenable, could be prognostic going forward.  Will further discuss with resident team.   Sid Falcon, MD 09/14/2017, 2:13 PM

## 2017-09-14 NOTE — Progress Notes (Signed)
[facility] Progress Note  Subjective: no c/o  Vitals:   09/14/17 0917 09/14/17 1159 09/14/17 1200 09/14/17 1441  BP: 130/79 125/73  (!) 152/82  Pulse:   72 66  Resp: 20   14  Temp:    98 F (36.7 C)  TempSrc:    Oral  SpO2: 100%   100%  Weight:      Height:        Inpatient medications: . amLODipine  10 mg Oral QHS  . aspirin  325 mg Oral Daily  . atorvastatin  80 mg Oral q1800  . calcitRIOL  1.5 mcg Oral Q T,Th,Sa-HD  . calcium acetate  1,334 mg Oral TID WC  . cinacalcet  30 mg Oral Q T,Th,Sa-HD  . [START ON 09/16/2017] darbepoetin (ARANESP) injection - DIALYSIS  150 mcg Intravenous Q Thu-HD  . furosemide  20 mg Oral Daily  . insulin aspart  0-9 Units Subcutaneous TID WC  . insulin glargine  10 Units Subcutaneous QHS  . metoprolol succinate  25 mg Oral Daily  . multivitamin  1 tablet Oral QHS   . sodium chloride     acetaminophen **OR** acetaminophen, diclofenac sodium, LORazepam, ondansetron  Exam: Alert, coughing spells come and go, no distress Slow speech, Ox 3 No jvd Chest clear bilat RRR no RG Abd soft ntnd Ext no edema, diffuse firm black papular skin lesions on legs R IJ cath / recent LUA AVG+bruit  Dialysis: [facility] 4h  2/2.25   94kg   Hep 6000 w/ [id number]  R IJ cath -mirc 200 last 12/13 -Fe q HD thru 12/25 -sensipar q HD -calc 1.25 ug tiw      Impression: 1.Stroke-like symptoms - w/ dysarthria, reportedly occurring in assocation with dialysis. MRI showing some areas of acute/ subacute signal changes, possible CPM.  CPM has been described in a dialysis setting (w/ rapid correction of hyponatremia and also with new HD patients in association with dysequilibrium).  It's possible, since she does apparently miss a lot of dialysis , that she may be subject to dysequilibrium and perhaps also CPM due to osmotic shifts.  Would look at cerebral vasculature also (CTA, MRA) since hypoperfusion is another concern, in terms of prognosis. Have d/w  primary team.    2. Chest pain - RLL lung opacity/pleural effusion- on Maxepime/Vanc - not convincing for PNA 2. ESRD- TTS; s/p LUE AVG 12/21. Next HD Thursday.   3. Anemia- hgb stable- not due for ESA until 12/27-- continue q HD Fe  4. Secondary hyperparathyroidism- Ca 8.0- continue sensipar TTS, calcitriol q HD and P 6.8- not getting routinely due swallowing issues - ok to hold for now  5.HTN/volume- BP better with 10 amlodipine; hypervolemic 2/2 inadequate UF at HD from s/o / compliance.continue totitrateEDW down while here for better BP control; net UF 3 L Saturday post wt - HD today, UF 1-2L  Plan - as above   Kelly Splinter MD [place] pager [phone]   09/14/2017, 2:48 PM   Recent Labs  Lab 09/11/17 0737 09/13/17 0800 09/14/17 0427  NA 134* 135 132*  K 4.9 4.4 4.0  CL 99* 99* 94*  CO2 24 25 27   GLUCOSE 103* 108* 207*  BUN 34* 37* 26*  CREATININE 9.38* 9.70* 7.06*  CALCIUM 8.0* 8.6* 8.5*  PHOS 6.8* 7.5* 5.7*   Recent Labs  Lab 09/11/17 0737 09/13/17 0800 09/14/17 0427  ALBUMIN 3.1* 3.2* 3.0*   Recent Labs  Lab 09/11/17 0834 09/13/17 0800 09/14/17  0427  WBC 7.1 6.1 6.7  HGB 10.7* 10.7* 11.1*  HCT 34.6* 34.9* 36.1  MCV 89.6 89.3 89.6  PLT 132* 80* 79*   Iron/TIBC/Ferritin/ %Sat    Component Value Date/Time   IRON 31 (L) 02/05/2013 0327   TIBC 155 (L) 02/05/2013 0327   FERRITIN 127 02/05/2013 0327   IRONPCTSAT 20 02/05/2013 0327

## 2017-09-14 NOTE — Progress Notes (Addendum)
   Subjective: Patient seen and examined. She states her speech is getting better. She also reiterates that she wants to get her strength back and will go to SNF. She was also requesting a new room for change of scenery. No other acute complaints.  She has agreed to try a repeat MRI with PO sedation.   Objective:  Vital signs in last 24 hours: Vitals:   09/13/17 1241 09/13/17 1941 09/14/17 0413 09/14/17 0917  BP: 133/73 116/69 131/75 130/79  Pulse:  84 71   Resp:  16 14 20   Temp: 97.8 F (36.6 C) 98.8 F (37.1 C) 97.8 F (36.6 C)   TempSrc: Axillary Oral Oral   SpO2:  99% 100% 100%  Weight:   199 lb 11.8 oz (90.6 kg)   Height:       General: Resting in bed comfortably HEENT: [place]/AT Neuro: alert and oriented X3, cranial nerves II-XII grossly intact with dysarthria; no focal neuro deficits Extremities: Left upper arm with mild TTP, incision clean and dry; palpable thrill; left hand strength 5/5, sensation intact Skin: umbilicated, hyperkeratotic papules, some with a central, white crust diffusely scattered on patients arms and legs  Assessment/Plan:  Principal Problem:   Chest pain Active Problems:   Essential hypertension   End-stage renal disease on hemodialysis (HCC)   Obesity  Dysarthria associated with HD MRI abnormalities Patient's dysarthria stable, alert and oriented. 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 Stroke work up completed: carotid ultrasound with 1-39% stenosis bilaterally; ECHO completed and showed no cardiac source of emboli; EF 35-40% with grade 3 diastolic dysfunction, not changed significantly from prior.  -Will try and get repeat MRI today -0.5 mg  PO lorazepam ordered q30 minutes for sedation -Neurology on board -Continue Aspirin 325 mg, Atorvastatin 80 mg -A1C 8.1 unchanged from prior; lipid panel WNL  -Social  work consult for SNF placement  -Continue PT/OT  Thrombocytopenia Platelets 89. Could be post-operative as well as antibiotic induced (received cefepime on admission). Will discontinue subq heparin and use SCDs as DVT ppx. No signs/symptoms of bleeding.  -Lab break tomorrow   T2DM BG this AM 168; patient eating more.No other episodes of hypoglycemia.  -Will increase lantus to 12 units qhs  -Will continue to monitor  -SSI -CBG monitoring   Hypertension 130/79 this AM. -Amlodipine to 10 mg qhs   ESRD on HD Post op Day 4 upper arm AV graft. Site looks clean and dry, mild swelling; palpable and audible thrill present -Nephro managing HD, appreciate recs -No pain meds ordered as patient has been intermittently lethargic and dysarthric , but pain seems well controlled with tylenol   Rash, Hyperkeratotic papules Chronic, patient has had them since her left BKA several years ago. 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  -Asymptomatic and stable   Dispo: Anticipated discharge in approximately 1-2 day(s) pending SNF placement.   Melanee Spry, MD 09/14/2017, 11:09 AM Pager: [phone]

## 2017-09-14 NOTE — Progress Notes (Signed)
STROKE TEAM PROGRESS NOTE   HISTORY OF PRESENT ILLNESS (per record) Karen [name] is an 41 y.o. female AA with PMH of ESRD on HD, T2DM, diabetic neuropathy, PAD s/p L BKA, and HTN who is here after experiencing chest and back pain at her dialysis center. She normally has fluctuating dysarthria with dialysis. On 12/22 it appears patient was severely dysarthric and dysphagic on eval by Speech pathologist. MRI brain was done and neurology consulted for further recommendations.   MRI brain  revealing the following: 1. Subcentimeter area of acute subcortical white matter infarction, RIGHT posterior temporal lobe. No hemorrhage. On my review of the images, this may also represent a focus of acute osmotic demyelination. 2. Low-level diffusion signal abnormality in the RIGHT cingulate gyrus, with normalized ADC could represent a subacute infarct. See #1, above regarding possible acute osmotic demyelination as an alternate explanation.  3. 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 On my review of the images, this appears to be late subacute. 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      SUBJECTIVE (INTERVAL HISTORY) Her   RN is at the bedside.   Patient appears to be improved today with speech being more clear. I explained to her the need to complete the MRI scan to get a definite answer about the MRI abnormality. She is in agreement to try with some sedation.    Home Medications:  Current Meds  Medication Sig  . furosemide (LASIX) 20 MG tablet Take 20 mg by mouth daily.  . insulin glargine (LANTUS) 100 UNIT/ML injection Inject 18 Units into the skin at bedtime.  . metoprolol succinate (TOPROL-XL) 25 MG 24 hr tablet Take 25 mg by mouth daily.  Marland Kitchen oxyCODONE-acetaminophen (PERCOCET/ROXICET) 5-325 MG tablet Take 1-2 tablets by mouth every 6 (six) hours as needed.      Hospital Medications:  Scheduled: . amLODipine   10 mg Oral QHS  . aspirin  325 mg Oral Daily  . atorvastatin  80 mg Oral q1800  . calcitRIOL  1.5 mcg Oral Q T,Th,Sa-HD  . calcium acetate  1,334 mg Oral TID WC  . cinacalcet  30 mg Oral Q T,Th,Sa-HD  . [START ON 09/16/2017] darbepoetin (ARANESP) injection - DIALYSIS  150 mcg Intravenous Q Thu-HD  . furosemide  20 mg Oral Daily  . insulin aspart  0-9 Units Subcutaneous TID WC  . insulin glargine  10 Units Subcutaneous QHS  . metoprolol succinate  25 mg Oral Daily  . multivitamin  1 tablet Oral QHS    OBJECTIVE Temp:  [97.8 F (36.6 C)-98.8 F (37.1 C)] 97.8 F (36.6 C) (12/25 0413) Pulse Rate:  [71-84] 72 (12/25 1200) Resp:  [14-20] 20 (12/25 0917) BP: (116-131)/(69-79) 125/73 (12/25 1159) SpO2:  [99 %-100 %] 100 % (12/25 0917) Weight:  [199 lb 11.8 oz (90.6 kg)] 199 lb 11.8 oz (90.6 kg) (12/25 0413)  CBC:  Recent Labs  Lab 09/13/17 0800 09/14/17 0427  WBC 6.1 6.7  HGB 10.7* 11.1*  HCT 34.9* 36.1  MCV 89.3 89.6  PLT 80* 79*    Basic Metabolic Panel:  Recent Labs  Lab 09/13/17 0800 09/14/17 0427  NA 135 132*  K 4.4 4.0  CL 99* 94*  CO2 25 27  GLUCOSE 108* 207*  BUN 37* 26*  CREATININE 9.70* 7.06*  CALCIUM 8.6* 8.5*  PHOS 7.5* 5.7*    Lipid Panel:     Component Value Date/Time   CHOL  110 09/13/2017 2139   TRIG 113 09/13/2017 2139   HDL 38 (L) 09/13/2017 2139   CHOLHDL 2.9 09/13/2017 2139   VLDL 23 09/13/2017 2139   LDLCALC 49 09/13/2017 2139   HgbA1c:  Lab Results  Component Value Date   HGBA1C 8.1 (H) 09/13/2017   Urine Drug Screen: No results found for: LABOPIA, COCAINSCRNUR, LABBENZ, AMPHETMU, THCU, LABBARB  Alcohol Level No results found for: ETH  IMAGING   Ct Head Wo Contrast 09/11/2017 IMPRESSION:  No acute abnormality. Atherosclerosis.     Mr Brain Wo Contrast 09/11/2017  IMPRESSION:  Prematurely truncated exam is of limited diagnostic accuracy. Subcentimeter area of acute subcortical white matter infarction, RIGHT posterior  temporal lobe. No hemorrhage. Low-level diffusion signal abnormality in the RIGHT cingulate gyrus, with normalized ADC could represent a subacute infarct. 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 Continued surveillance warranted. Findings discussed with Dr. Jari Favre. When the patient is better able cooperate recommend repeat MRI brain without with contrast.     Transthoracic Echocardiogram  09/12/2017 Study Conclusions - Left ventricle: The cavity size was normal. Systolic function was   moderately reduced. The estimated ejection fraction was in the   range of 35% to 40%. Diffuse hypokinesis. Doppler parameters are   consistent with a reversible restrictive pattern, indicative of   decreased left ventricular diastolic compliance and/or increased   left atrial pressure (grade 3 diastolic dysfunction). - Mitral valve: Moderately calcified annulus. Mildly thickened   leaflets . There was mild regurgitation. - Left atrium: The atrium was mildly dilated. Volume/bsa, ES,   (1-plane Simpson&'s, A2C): 32.9 ml/m^2. - Pulmonary arteries: Systolic pressure was mildly increased. PA   peak pressure: 32 mm Hg (S). Impressions: - No cardiac source of emboli was indentified. Compared to the   prior study, there has been no significant interval change.    Bilateral Carotid Dopplers -technically difficult due to patient movement. Bilat 1-39% ICA stenosis       PHYSICAL EXAM Vitals:   09/14/17 0413 09/14/17 0917 09/14/17 1159 09/14/17 1200  BP: 131/75 130/79 125/73   Pulse: 71   72  Resp: 14 20    Temp: 97.8 F (36.6 C)     TempSrc: Oral     SpO2: 100% 100%    Weight: 199 lb 11.8 oz (90.6 kg)     Height:       Frail and malnourished middle-aged African-American lady seems restless and she is getting hemodialysis. She has left below-knee amputation. There are multiple nodular hyperkeratotic skin lesions over her  extremities. Dialysis fistula right upper extremity. Neurological exam. Awake and alert disoriented. Mild dysarthria   Follows commands well. Extraocular moments are full range without nystagmus. Blinks to threat bilaterally. Fundi not visualized. Face is symmetric. Tongue midline. Motor system exam able to move both upper and right lower extremity well. Can move left lower extremity well as well. No focal weakness.   ASSESSMENT/PLAN Ms. Artemis Koller Koch is a 41 y.o. female with history of ESRD on HD, T2DM, diabetic neuropathy, PAD s/p L BKA, and HTN  presenting with chest pain, back pain, dysarthria, and dysphagia.. She did not receive IV t-PA due to late presentation.  Stroke:  Right posterior temporal lobe infarct - small vessel disease is likely. Abnormal limited MRI showing central pontine white matter hyperintensities possibly osmotic demyelination versus small vessel disease    Resultant  dysarthria   CT head - No acute abnormality.  MRI head - Subcentimeter  area of acute subcortical white matter infarction, Rt posterior temporal lobe  MRA head - not performed  Carotid Doppler - pending  2D Echo - EF 35-40%. No cardiac source of emboli identified.  LDL - pending  HgbA1c - 8.1  VTE prophylaxis - subcutaneous heparin Diet renal/carb modified with fluid restriction Diet-HS Snack? Nothing; Room service appropriate? Yes; Fluid consistency: Thin; Fluid restriction: 1200 mL Fluid  No antithrombotic prior to admission, now on aspirin 325 mg daily  Patient counseled to be compliant with her antithrombotic medications  Ongoing aggressive stroke risk factor management  Therapy recommendations:  pending  Disposition:  Pending  Hypertension  Stable  Permissive hypertension (OK if < 220/120) but gradually normalize in 5-7 days  Long-term BP goal normotensive  Hyperlipidemia  Home meds:  No lipid lowering medications prior to admission  LDL pending, goal < 70  Now on Lipitor 80  mg daily  Continue statin at discharge  Diabetes  HgbA1c 8.1, goal < 7.0  Uncontrolled  Other Stroke Risk Factors  Former cigarette smoker - quit 12 years ago  Obesity, Body mass index is 32.24 kg/m., recommend weight loss, diet and exercise as appropriate   Possible TIA history - fluctuating dysarthria with dialysis   Other Active Problems  Anemia  Thrombocytopenia  End-stage renal disease  Possible myelinolysis - repeat MRI Brain with and without contrast recommended   Plan / Recommendations  Hospital day # 7    She has presented with dysarthria which fluctuates and gets worse during dialysis likely secondary to underlying encephalopathy from renal failure as well as small vessel disease related stroke. MRI does show some white matter hyperintensities in the pons but is limited in all sequences are not obtained hence it is not clear whether she meets the criteria for central pontine myelin lysis or not. Recommend better quality MRI without contrast if patient is cooperative. Aspirin. 325mg  daily and Lipitor as well as strict control of hypertension and lipids and sugar. Physical, occupational ,speech therapy. Stroke team will follow. Greater than 50% time during this 25 minute visit was spent on counseling and coordination of care about her dysarthria, stroke and abnormal MRI findings discussed with Dr. Daryll Drown and internal medicine teaching service team  Antony Contras, [place] Pager: [phone] 09/14/2017 12:41 PM   To contact Stroke Continuity provider, please refer to [email or web address] After hours, contact General Neurology

## 2017-09-15 ENCOUNTER — Other Ambulatory Visit: Payer: Self-pay | Admitting: Internal Medicine

## 2017-09-15 DIAGNOSIS — R471 Dysarthria and anarthria: Secondary | ICD-10-CM | POA: Diagnosis not present

## 2017-09-15 DIAGNOSIS — T8249XA Other complication of vascular dialysis catheter, initial encounter: Secondary | ICD-10-CM | POA: Diagnosis not present

## 2017-09-15 DIAGNOSIS — E119 Type 2 diabetes mellitus without complications: Secondary | ICD-10-CM | POA: Diagnosis not present

## 2017-09-15 DIAGNOSIS — R488 Other symbolic dysfunctions: Secondary | ICD-10-CM | POA: Diagnosis not present

## 2017-09-15 DIAGNOSIS — I639 Cerebral infarction, unspecified: Secondary | ICD-10-CM | POA: Diagnosis not present

## 2017-09-15 DIAGNOSIS — D631 Anemia in chronic kidney disease: Secondary | ICD-10-CM | POA: Diagnosis not present

## 2017-09-15 DIAGNOSIS — E1129 Type 2 diabetes mellitus with other diabetic kidney complication: Secondary | ICD-10-CM | POA: Diagnosis not present

## 2017-09-15 DIAGNOSIS — D509 Iron deficiency anemia, unspecified: Secondary | ICD-10-CM | POA: Diagnosis not present

## 2017-09-15 DIAGNOSIS — E1122 Type 2 diabetes mellitus with diabetic chronic kidney disease: Secondary | ICD-10-CM | POA: Diagnosis not present

## 2017-09-15 DIAGNOSIS — N186 End stage renal disease: Secondary | ICD-10-CM | POA: Diagnosis not present

## 2017-09-15 DIAGNOSIS — R079 Chest pain, unspecified: Secondary | ICD-10-CM | POA: Diagnosis not present

## 2017-09-15 DIAGNOSIS — N2581 Secondary hyperparathyroidism of renal origin: Secondary | ICD-10-CM | POA: Diagnosis not present

## 2017-09-15 DIAGNOSIS — M6281 Muscle weakness (generalized): Secondary | ICD-10-CM | POA: Diagnosis not present

## 2017-09-15 DIAGNOSIS — B957 Other staphylococcus as the cause of diseases classified elsewhere: Secondary | ICD-10-CM | POA: Diagnosis not present

## 2017-09-15 DIAGNOSIS — R279 Unspecified lack of coordination: Secondary | ICD-10-CM | POA: Diagnosis not present

## 2017-09-15 DIAGNOSIS — Z8673 Personal history of transient ischemic attack (TIA), and cerebral infarction without residual deficits: Secondary | ICD-10-CM | POA: Insufficient documentation

## 2017-09-15 DIAGNOSIS — J9811 Atelectasis: Secondary | ICD-10-CM | POA: Diagnosis not present

## 2017-09-15 DIAGNOSIS — R262 Difficulty in walking, not elsewhere classified: Secondary | ICD-10-CM | POA: Diagnosis not present

## 2017-09-15 DIAGNOSIS — I10 Essential (primary) hypertension: Secondary | ICD-10-CM | POA: Diagnosis not present

## 2017-09-15 DIAGNOSIS — Z992 Dependence on renal dialysis: Secondary | ICD-10-CM | POA: Diagnosis not present

## 2017-09-15 DIAGNOSIS — G464 Cerebellar stroke syndrome: Secondary | ICD-10-CM | POA: Diagnosis not present

## 2017-09-15 DIAGNOSIS — R1310 Dysphagia, unspecified: Secondary | ICD-10-CM | POA: Diagnosis not present

## 2017-09-15 DIAGNOSIS — Z89512 Acquired absence of left leg below knee: Secondary | ICD-10-CM | POA: Diagnosis not present

## 2017-09-15 LAB — GLUCOSE, CAPILLARY
Glucose-Capillary: 76 mg/dL (ref 65–99)
Glucose-Capillary: 97 mg/dL (ref 65–99)

## 2017-09-15 MED ORDER — INSULIN GLARGINE 100 UNIT/ML ~~LOC~~ SOLN: 10.0000 [IU] | Freq: Every day | SUBCUTANEOUS | 11 refills | Status: DC

## 2017-09-15 MED ORDER — CINACALCET HCL 30 MG PO TABS: 30.0000 mg | ORAL_TABLET | ORAL | 0 refills | Status: DC

## 2017-09-15 MED ORDER — CALCITRIOL 0.5 MCG PO CAPS: 1.5000 ug | ORAL_CAPSULE | ORAL | 0 refills | Status: DC

## 2017-09-15 MED ORDER — DARBEPOETIN ALFA 150 MCG/0.3ML IJ SOSY: 150.0000 ug | PREFILLED_SYRINGE | INTRAMUSCULAR | 0 refills | Status: DC

## 2017-09-15 MED ORDER — AMLODIPINE BESYLATE 5 MG PO TABS: 10.0000 mg | ORAL_TABLET | Freq: Every day | ORAL | 0 refills | Status: DC

## 2017-09-15 MED ORDER — ATORVASTATIN CALCIUM 80 MG PO TABS: 80.0000 mg | ORAL_TABLET | Freq: Every day | ORAL | 0 refills | Status: DC

## 2017-09-15 MED ORDER — ASPIRIN 325 MG PO TABS: 325.0000 mg | ORAL_TABLET | Freq: Every day | ORAL | 0 refills | Status: DC

## 2017-09-15 MED ORDER — RENA-VITE PO TABS: 1.0000 | ORAL_TABLET | Freq: Every day | ORAL | 0 refills | Status: DC

## 2017-09-15 MED ORDER — CALCIUM ACETATE (PHOS BINDER) 667 MG PO CAPS: 1334.0000 mg | ORAL_CAPSULE | Freq: Three times a day (TID) | ORAL | 0 refills | Status: DC

## 2017-09-15 NOTE — Progress Notes (Signed)
Patient will discharge to [facility] Anticipated discharge date: 09/15/17 Family notified: Netta Neat, godmother Transportation by: Corey Harold  Nurse to call report to [phone].   CSW signing off.  Estanislado Emms, [place]  Clinical Social Worker

## 2017-09-15 NOTE — Progress Notes (Signed)
Occupational Therapy Treatment Patient Details Name: Karen [name] MRN: [id number] DOB: [birth year] Today's Date: 09/15/2017    History of present illness Karen [name] is a 41 yo F with history of ESRD on HD, type 2 diabetes, diabetic neuropathy, PAD status post a left BKA, and hypertension, who was admitted with chest pain.  Patient s/p LUE AVG, and new dysarthria.   OT comments  Pt completed seated grooming and UB dressing with set up/supervision. Pt alert and participating well. Continues to be appropriate for SNF.  Follow Up Recommendations  SNF    Equipment Recommendations       Recommendations for Other Services      Precautions / Restrictions Precautions Precautions: Fall       Mobility Bed Mobility               General bed mobility comments: pt in chair  Transfers                      Balance Overall balance assessment: Needs assistance   Sitting balance-Leahy Scale: Fair                                     ADL either performed or assessed with clinical judgement   ADL Overall ADL's : Needs assistance/impaired     Grooming: Wash/dry hands;Wash/dry face;Oral care;Brushing hair;Sitting;Set up           Upper Body Dressing : Set up;Sitting                           Vision       Perception     Praxis      Cognition Arousal/Alertness: Awake/alert Behavior During Therapy: Flat affect Overall Cognitive Status: No family/caregiver present to determine baseline cognitive functioning                                          Exercises     Shoulder Instructions       General Comments      Pertinent Vitals/ Pain       Pain Assessment: No/denies pain  Home Living                                          Prior Functioning/Environment              Frequency  Min 2X/week        Progress Toward Goals  OT Goals(current goals can now be found in the care plan  section)  Progress towards OT goals: Progressing toward goals  Acute Rehab OT Goals Patient Stated Goal: None stated OT Goal Formulation: With patient Time For Goal Achievement: 10/03/17 Potential to Achieve Goals: Good  Plan Discharge plan remains appropriate    Co-evaluation                 AM-PAC PT "6 Clicks" Daily Activity     Outcome Measure   Help from another person eating meals?: None Help from another person taking care of personal grooming?: A Little Help from another person toileting, which includes using toliet, bedpan, or urinal?: A Lot Help from another person bathing (including  washing, rinsing, drying)?: A Lot Help from another person to put on and taking off regular upper body clothing?: None Help from another person to put on and taking off regular lower body clothing?: A Lot 6 Click Score: 17    End of Session    OT Visit Diagnosis: Unsteadiness on feet (R26.81);Muscle weakness (generalized) (M62.81);Other symptoms and signs involving cognitive function;Pain   Activity Tolerance Patient tolerated treatment well   Patient Left in chair;with call bell/phone within reach;with family/visitor present   Nurse Communication          Time: 1415-1430 OT Time Calculation (min): 15 min  Charges: OT General Charges $OT Visit: 1 Visit OT Treatments $Self Care/Home Management : 8-22 mins  09/15/2017 Karen [name], OTR/L Pager: [phone]   Karen [name] Karen [name] 09/15/2017, 3:33 PM

## 2017-09-15 NOTE — Care Management Note (Signed)
Case Management Note  Patient Details  Name: Xzandria Clevinger Cowell MRN: [id number] Date of Birth: Jul 05, 1976  Subjective/Objective:                 DC to SNF   Action/Plan:   Expected Discharge Date:  09/15/17               Expected Discharge Plan:  Skilled Nursing Facility  In-House Referral:  Clinical Social Work  Discharge planning Services  CM Consult  Post Acute Care Choice:    Choice offered to:     DME Arranged:    DME Agency:     HH Arranged:    [place] Agency:     Status of Service:  Completed, signed off  If discussed at [employer], dates discussed:    Additional Comments:  Carles Collet, RN 09/15/2017, 3:35 PM

## 2017-09-15 NOTE — Progress Notes (Signed)
[facility] Progress Note  Subjective: no c/o, says she is going home todya  Vitals:   09/14/17 1922 09/15/17 0037 09/15/17 0347 09/15/17 0741  BP: (!) 159/92 (!) 155/82 (!) 146/75 (!) 144/76  Pulse: 68 74 78 70  Resp: 17 16 17 18   Temp: 98.1 F (36.7 C) 98.2 F (36.8 C) 97.7 F (36.5 C) 98.8 F (37.1 C)  TempSrc: Oral Oral Oral Oral  SpO2: 100% 100% 100% 100%  Weight:   94 kg (207 lb 4.8 oz)   Height:        Inpatient medications: . aspirin  325 mg Oral Daily  . atorvastatin  80 mg Oral q1800  . calcitRIOL  1.5 mcg Oral Q T,Th,Sa-HD  . calcium acetate  1,334 mg Oral TID WC  . cinacalcet  30 mg Oral Q T,Th,Sa-HD  . [START ON 09/16/2017] darbepoetin (ARANESP) injection - DIALYSIS  150 mcg Intravenous Q Thu-HD  . furosemide  20 mg Oral Daily  . insulin aspart  0-9 Units Subcutaneous TID WC  . insulin glargine  10 Units Subcutaneous QHS  . metoprolol succinate  25 mg Oral Daily  . multivitamin  1 tablet Oral QHS   . sodium chloride     acetaminophen **OR** acetaminophen, diclofenac sodium, hydrALAZINE, LORazepam, ondansetron  Exam: Alert, no distress Slow speech, Ox 3 No jvd Chest clear bilat RRR no RG Abd soft ntnd Ext no edema, diffuse firm black papular skin lesions on legs R IJ cath / recent LUA AVG+bruit  Dialysis: [facility] 4h  2/2.25   94kg   Hep 6000 w/ 2027midrun  R IJ cath -mirc 200 last 12/13 -Fe q HD thru 12/25 -sensipar q HD -calc 1.25 ug tiw      Impression: 1.Stroke-like symptoms - w/ dysarthria, reportedly occurring in assocation with dialysis. MRI showing some areas of acute/ subacute signal changes, possible CPM.  CPM reported in HD patients but minimally and in association w/ either hypoNa+ or dysequilibrium.  2. Chest pain - RLL lung opacity, got some IV abx but now stopped. Afeb.  3. ESRD- TTS; s/p LUE AVG 12/21. Next HD Thursday it still here 4. Anemia- hgb stable- not due for ESA until 12/27-- continue q HD Fe  5.  Secondary hyperparathyroidism- Ca 8.0- continue meds 6. HTN/volume- stable, BP's good. 7.  Dispo - per patient may be dc'd today   Plan - as above   Kelly Splinter MD [facility] pager [phone]   09/15/2017, 10:47 AM   Recent Labs  Lab 09/11/17 0737 09/13/17 0800 09/14/17 0427  NA 134* 135 132*  K 4.9 4.4 4.0  CL 99* 99* 94*  CO2 24 25 27   GLUCOSE 103* 108* 207*  BUN 34* 37* 26*  CREATININE 9.38* 9.70* 7.06*  CALCIUM 8.0* 8.6* 8.5*  PHOS 6.8* 7.5* 5.7*   Recent Labs  Lab 09/11/17 0737 09/13/17 0800 09/14/17 0427  ALBUMIN 3.1* 3.2* 3.0*   Recent Labs  Lab 09/11/17 0834 09/13/17 0800 09/14/17 0427  WBC 7.1 6.1 6.7  HGB 10.7* 10.7* 11.1*  HCT 34.6* 34.9* 36.1  MCV 89.6 89.3 89.6  PLT 132* 80* 79*   Iron/TIBC/Ferritin/ %Sat    Component Value Date/Time   IRON 31 (L) 02/05/2013 0327   TIBC 155 (L) 02/05/2013 0327   FERRITIN 127 02/05/2013 0327   IRONPCTSAT 20 02/05/2013 0327

## 2017-09-15 NOTE — Progress Notes (Signed)
Physical Therapy Treatment Patient Details Name: Karen [name] MRN: [id number] DOB: July 01, 1976 Today's Date: 09/15/2017    History of Present Illness Ms. Pavelko is a 41 yo F with history of ESRD on HD, type 2 diabetes, diabetic neuropathy, PAD status post a left BKA, and hypertension, who was admitted with chest pain.  Patient s/p LUE AVG, and new dysarthria.    PT Comments    Pt was able to participate well this session and emphasis placed on gait stability and training.   Follow Up Recommendations  SNF;Supervision/Assistance - 24 hour     Equipment Recommendations  Rolling walker with 5" wheels    Recommendations for Other Services       Precautions / Restrictions Precautions Precautions: Fall    Mobility  Bed Mobility               General bed mobility comments: pt in the recliner  Transfers Overall transfer level: Needs assistance Equipment used: Rolling walker (2 wheeled) Transfers: Sit to/from Stand Sit to Stand: Mod assist         General transfer comment: cues to scoot closer to the edge.  assist to help pt forward and boost up.  Ambulation/Gait Ambulation/Gait assistance: Min assist;Mod assist Ambulation Distance (Feet): 10 Feet(then 14, 14 and 8 feet, respectively.) Assistive device: Rolling walker (2 wheeled) Gait Pattern/deviations: Step-through pattern;Decreased step length - right;Decreased step length - left;Shuffle;Steppage   Gait velocity interpretation: Below normal speed for age/gender General Gait Details: pt took short, wide BOS steps with prosthetic L leg in ER.  pt tended to stay too far from the RW and stay flexed.   Stairs            Wheelchair Mobility    Modified Rankin (Stroke Patients Only) Modified Rankin (Stroke Patients Only) Modified Rankin: Moderately severe disability     Balance Overall balance assessment: Needs assistance Sitting-balance support: No upper extremity supported Sitting balance-Leahy Scale:  Fair     Standing balance support: Bilateral upper extremity supported Standing balance-Leahy Scale: Poor Standing balance comment: Reliant on external support or a RW                            Cognition Arousal/Alertness: Awake/alert Behavior During Therapy: WFL for tasks assessed/performed Overall Cognitive Status: No family/caregiver present to determine baseline cognitive functioning                                        Exercises      General Comments        Pertinent Vitals/Pain Pain Assessment: Faces Faces Pain Scale: Hurts a little bit Pain Location: LUE graft site Pain Descriptors / Indicators: Sore Pain Intervention(s): Monitored during session    Home Living                      Prior Function            PT Goals (current goals can now be found in the care plan section) Acute Rehab PT Goals Patient Stated Goal: None stated PT Goal Formulation: With patient Time For Goal Achievement: 09/19/17 Potential to Achieve Goals: Good Progress towards PT goals: Progressing toward goals    Frequency    Min 3X/week      PT Plan Current plan remains appropriate    Co-evaluation  AM-PAC PT "6 Clicks" Daily Activity  Outcome Measure  Difficulty turning over in bed (including adjusting bedclothes, sheets and blankets)?: A Little Difficulty moving from lying on back to sitting on the side of the bed? : Unable Difficulty sitting down on and standing up from a chair with arms (e.g., wheelchair, bedside commode, etc,.)?: Unable Help needed moving to and from a bed to chair (including a wheelchair)?: A Lot Help needed walking in hospital room?: A Lot Help needed climbing 3-5 steps with a railing? : A Lot 6 Click Score: 11    End of Session   Activity Tolerance: Patient tolerated treatment well Patient left: Other (comment)(on stretcher heading to SNF)   PT Visit Diagnosis: Unsteadiness on feet  (R26.81);Other abnormalities of gait and mobility (R26.89);Muscle weakness (generalized) (M62.81)     Time: 4128-7867 PT Time Calculation (min) (ACUTE ONLY): 23 min  Charges:  $Gait Training: 8-22 mins $Therapeutic Activity: 8-22 mins                    G Codes:       09/27/17  Donnella Sham, PT [phone]  (pager)   Tessie Fass Artesha Wemhoff 09-27-2017, 4:26 PM

## 2017-09-15 NOTE — Clinical Social Work Note (Signed)
Clinical Social Work Assessment  Patient Details  Name: Karen [name] MRN: [id number] Date of Birth: [birth year]  Date of referral:  09/15/17               Reason for consult:  Facility Placement                Permission sought to share information with:  Family Supports, Customer service manager Permission granted to share information::  Yes, Verbal Permission Granted  Name::     [place]::  SNFs  Relationship::  godmother  Contact Information:  [phone]  Housing/Transportation Living arrangements for the past 2 months:  Apartment Source of Information:  Patient Patient Interpreter Needed:  None Criminal Activity/Legal Involvement Pertinent to Current Situation/Hospitalization:  No - Comment as needed Significant Relationships:  Dependent Children, Other Family Members Lives with:  Self Do you feel safe going back to the place where you live?  Yes Need for family participation in patient care:  Yes (Comment)  Care giving concerns: Patient from home. PT recommending SNF.   Social Worker assessment / plan: CSW met with patient at bedside; patient alert and oriented. CSW discussed recommendations for SNF; patient agreeable to SNF and chooses [facility]. CSW confirmed with [facility] they can take patient today. Facility to provide transport to HD while patient admitted there. CSW spoke to patient's godmother, Tammy, via phone. Tammy also agreeable to SNF and can help sign patient in. CSW to support with discharge.  Employment status:  Disabled (Comment on whether or not currently receiving Disability) Insurance information:  Medicare, Medicaid [place] PT Recommendations:  [place] / Referral to community resources:  [place]  Patient/Family's Response to care: Patient and family appreciative of care.  Patient/Family's Understanding of and Emotional Response to Diagnosis, Current Treatment, and  Prognosis: Patient with good understanding of her condition and discharge plan. Patient agreeable to short term SNF before eventual return home.  Emotional Assessment Appearance:  Appears stated age Attitude/Demeanor/Rapport:  Other(appropriate) Affect (typically observed):  Calm Orientation:  Oriented to Self, Oriented to Place, Oriented to  Time, Oriented to Situation Alcohol / Substance use:  Not Applicable Psych involvement (Current and /or in the community):  No (Comment)  Discharge Needs  Concerns to be addressed:  Discharge Planning Concerns Readmission within the last 30 days:  No Current discharge risk:  Physical Impairment Barriers to Discharge:  No Barriers Identified   Estanislado Emms, LCSW 09/15/2017, 10:26 AM

## 2017-09-15 NOTE — Progress Notes (Signed)
STROKE TEAM PROGRESS NOTE   HISTORY OF PRESENT ILLNESS (per record) Karen [name] is an 41 y.o. female AA with PMH of ESRD on HD, T2DM, diabetic neuropathy, PAD s/p L BKA, and HTN who is here after experiencing chest and back pain at her dialysis center. She normally has fluctuating dysarthria with dialysis. On 12/22 it appears patient was severely dysarthric and dysphagic on eval by Speech pathologist. MRI brain was done and neurology consulted for further recommendations.   MRI brain  revealing the following: 1. Subcentimeter area of acute subcortical white matter infarction, RIGHT posterior temporal lobe. No hemorrhage. On my review of the images, this may also represent a focus of acute osmotic demyelination. 2. Low-level diffusion signal abnormality in the RIGHT cingulate gyrus, with normalized ADC could represent a subacute infarct. See #1, above regarding possible acute osmotic demyelination as an alternate explanation.  3. 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 On my review of the images, this appears to be late subacute. 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      SUBJECTIVE (INTERVAL HISTORY) Her   RN is at the bedside.   Patient appears to be improved today with speech being more clear.The repeat MRI scan shows changes of small vessel disease rather than central pontine myelinolysis   Home Medications:  Current Meds  Medication Sig  . [DISCONTINUED] furosemide (LASIX) 20 MG tablet Take 20 mg by mouth daily.  . [DISCONTINUED] insulin glargine (LANTUS) 100 UNIT/ML injection Inject 18 Units into the skin at bedtime.  . [DISCONTINUED] metoprolol succinate (TOPROL-XL) 25 MG 24 hr tablet Take 25 mg by mouth daily.  . [DISCONTINUED] oxyCODONE-acetaminophen (PERCOCET/ROXICET) 5-325 MG tablet Take 1-2 tablets by mouth every 6 (six) hours as needed.      Hospital Medications:  Scheduled: . aspirin   325 mg Oral Daily  . atorvastatin  80 mg Oral q1800  . calcitRIOL  1.5 mcg Oral Q T,Th,Sa-HD  . calcium acetate  1,334 mg Oral TID WC  . cinacalcet  30 mg Oral Q T,Th,Sa-HD  . [START ON 09/16/2017] darbepoetin (ARANESP) injection - DIALYSIS  150 mcg Intravenous Q Thu-HD  . furosemide  20 mg Oral Daily  . insulin aspart  0-9 Units Subcutaneous TID WC  . insulin glargine  10 Units Subcutaneous QHS  . metoprolol succinate  25 mg Oral Daily  . multivitamin  1 tablet Oral QHS    OBJECTIVE Temp:  [97.7 F (36.5 C)-98.8 F (37.1 C)] 98.4 F (36.9 C) (12/26 1053) Pulse Rate:  [68-78] 72 (12/26 1053) Resp:  [16-18] 18 (12/26 1053) BP: (144-159)/(75-92) 158/85 (12/26 1053) SpO2:  [100 %] 100 % (12/26 1053) Weight:  [207 lb 4.8 oz (94 kg)] 207 lb 4.8 oz (94 kg) (12/26 0347)  CBC:  Recent Labs  Lab 09/13/17 0800 09/14/17 0427  WBC 6.1 6.7  HGB 10.7* 11.1*  HCT 34.9* 36.1  MCV 89.3 89.6  PLT 80* 79*    Basic Metabolic Panel:  Recent Labs  Lab 09/13/17 0800 09/14/17 0427  NA 135 132*  K 4.4 4.0  CL 99* 94*  CO2 25 27  GLUCOSE 108* 207*  BUN 37* 26*  CREATININE 9.70* 7.06*  CALCIUM 8.6* 8.5*  PHOS 7.5* 5.7*    Lipid Panel:     Component Value Date/Time   CHOL 110 09/13/2017 2139   TRIG 113 09/13/2017 2139   HDL 38 (L) 09/13/2017 2139   CHOLHDL 2.9 09/13/2017  2139   VLDL 23 09/13/2017 2139   LDLCALC 49 09/13/2017 2139   HgbA1c:  Lab Results  Component Value Date   HGBA1C 8.1 (H) 09/13/2017   Urine Drug Screen: No results found for: LABOPIA, COCAINSCRNUR, LABBENZ, AMPHETMU, THCU, LABBARB  Alcohol Level No results found for: ETH  IMAGING   Ct Head Wo Contrast 09/11/2017 IMPRESSION:  No acute abnormality. Atherosclerosis.     Mr Brain Wo Contrast 09/11/2017  IMPRESSION:  Prematurely truncated exam is of limited diagnostic accuracy. Subcentimeter area of acute subcortical white matter infarction, RIGHT posterior temporal lobe. No hemorrhage. Low-level  diffusion signal abnormality in the RIGHT cingulate gyrus, with normalized ADC could represent a subacute infarct. 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 Continued surveillance warranted. Findings discussed with Dr. Jari Favre. When the patient is better able cooperate recommend repeat MRI brain without with contrast.     Transthoracic Echocardiogram  09/12/2017 Study Conclusions - Left ventricle: The cavity size was normal. Systolic function was   moderately reduced. The estimated ejection fraction was in the   range of 35% to 40%. Diffuse hypokinesis. Doppler parameters are   consistent with a reversible restrictive pattern, indicative of   decreased left ventricular diastolic compliance and/or increased   left atrial pressure (grade 3 diastolic dysfunction). - Mitral valve: Moderately calcified annulus. Mildly thickened   leaflets . There was mild regurgitation. - Left atrium: The atrium was mildly dilated. Volume/bsa, ES,   (1-plane Simpson&'s, A2C): 32.9 ml/m^2. - Pulmonary arteries: Systolic pressure was mildly increased. PA   peak pressure: 32 mm Hg (S). Impressions: - No cardiac source of emboli was indentified. Compared to the   prior study, there has been no significant interval change.    Bilateral Carotid Dopplers -technically difficult due to patient movement. Bilat 1-39% ICA stenosis  MRI brain 09/14/17 ;  1. Tiny acute infarct in the left pons. The remainder of pontine and brachium pontis signal abnormality is likely from prior ischemic injury. 2. Known small acute infarct in the right parietal white matter. 3. Small subacute to chronic infarct in the right corpus callosum/cingulate gyrus region.    PHYSICAL EXAM Vitals:   09/15/17 0037 09/15/17 0347 09/15/17 0741 09/15/17 1053  BP: (!) 155/82 (!) 146/75 (!) 144/76 (!) 158/85  Pulse: 74 78 70 72  Resp: 16 17 18 18   Temp: 98.2 F (36.8 C)  97.7 F (36.5 C) 98.8 F (37.1 C) 98.4 F (36.9 C)  TempSrc: Oral Oral Oral Oral  SpO2: 100% 100% 100% 100%  Weight:  207 lb 4.8 oz (94 kg)    Height:       Frail and malnourished middle-aged African-American lady seems restless and she is getting hemodialysis. She has left below-knee amputation. There are multiple nodular hyperkeratotic skin lesions over her extremities. Dialysis fistula right upper extremity. Neurological exam. Awake and alert disoriented. Mild dysarthria   Follows commands well. Extraocular moments are full range without nystagmus. Blinks to threat bilaterally. Fundi not visualized. Face is symmetric. Tongue midline. Motor system exam able to move both upper and right lower extremity well. Can move left lower extremity well as well. No focal weakness.   ASSESSMENT/PLAN Karen [name] is a 41 y.o. female with history of ESRD on HD, T2DM, diabetic neuropathy, PAD s/p L BKA, and HTN  presenting with chest pain, back pain, dysarthria, and dysphagia.. She did not receive IV t-PA due to late presentation.  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 Abnormal limited MRI showing central pontine white matter hyperintensities likely from small vessel disease    Resultant  dysarthria   CT head - No acute abnormality.  MRI head - Subcentimeter area of acute subcortical white matter infarction, Rt posterior temporal lobe  MRA head - not performed  Carotid Doppler - pending  2D Echo - EF 35-40%. No cardiac source of emboli identified.  LDL - pending  HgbA1c - 8.1  VTE prophylaxis - subcutaneous heparin Diet renal/carb modified with fluid restriction Diet-HS Snack? Nothing; Room service appropriate? Yes; Fluid consistency: Thin; Fluid restriction: 1200 mL Fluid Diet - low sodium heart healthy  No antithrombotic prior to admission, now on aspirin 325 mg daily  Patient counseled to be compliant with her  antithrombotic medications  Ongoing aggressive stroke risk factor management  Therapy recommendations:  pending  Disposition:  Pending  Hypertension  Stable  Permissive hypertension (OK if < 220/120) but gradually normalize in 5-7 days  Long-term BP goal normotensive  Hyperlipidemia  Home meds:  No lipid lowering medications prior to admission  LDL pending, goal < 70  Now on Lipitor 80 mg daily  Continue statin at discharge  Diabetes  HgbA1c 8.1, goal < 7.0  Uncontrolled  Other Stroke Risk Factors  Former cigarette smoker - quit 12 years ago  Obesity, Body mass index is 33.46 kg/m., recommend weight loss, diet and exercise as appropriate   Possible TIA history - fluctuating dysarthria with dialysis   Other Active Problems  Anemia  Thrombocytopenia  End-stage renal disease  Possible myelinolysis - repeat MRI Brain with and without contrast recommended   Plan / Recommendations  Hospital day # 8  Agree with transfer back to skilled nursing facility. Continue aggressive risk factor modification and aspirin. Stroke team will sign off.    Antony Contras, MD Medical Director [facility] Pager: [phone] 09/15/2017 3:52 PM   To contact Stroke Continuity provider, please refer to [email or web address] After hours, contact General Neurology

## 2017-09-15 NOTE — Clinical Social Work Placement (Signed)
   CLINICAL SOCIAL WORK PLACEMENT  NOTE  Date:  09/15/2017  Patient Details  Name: Karen [name] MRN: [id number] Date of Birth: 1976-08-20  Clinical Social Work is seeking post-discharge placement for this patient at the [place] level of care (*CSW will initial, date and re-position this form in  chart as items are completed):  Yes   Patient/family provided with [place] Work Department's list of facilities offering this level of care within the geographic area requested by the patient (or if unable, by the patient's family).  Yes   Patient/family informed of their freedom to choose among providers that offer the needed level of care, that participate in Medicare, Medicaid or managed care program needed by the patient, have an available bed and are willing to accept the patient.  Yes   Patient/family informed of [facility]'s ownership interest in [facility] and [facility], as well as of the fact that they are under no obligation to receive care at these facilities.  PASRR submitted to EDS on 09/15/17     PASRR number received on 09/15/17     Existing PASRR number confirmed on       FL2 transmitted to all facilities in geographic area requested by pt/family on 09/15/17     FL2 transmitted to all facilities within larger geographic area on 09/15/17     Patient informed that his/her managed care company has contracts with or will negotiate with certain facilities, including the following:  [facility]     Yes   Patient/family informed of bed offers received.  Patient chooses bed at [facility] and [place] recommends and patient chooses bed at      Patient to be transferred to [facility] on 09/15/17.  Patient to be transferred to facility by PTAR     Patient family notified on 09/15/17 of transfer.  Name of family member notified:  Netta Neat, godmother     PHYSICIAN Please prepare  priority discharge summary, including medications, Please prepare prescriptions, Please sign FL2     Additional Comment:    _______________________________________________ Estanislado Emms, LCSW 09/15/2017, 10:30 AM

## 2017-09-15 NOTE — Progress Notes (Signed)
   Subjective: Patient seen and examined. No acute events overnight. She has no acute complaints at this time.   Objective:  Vital signs in last 24 hours: Vitals:   09/14/17 1922 09/15/17 0037 09/15/17 0347 09/15/17 0741  BP: (!) 159/92 (!) 155/82 (!) 146/75 (!) 144/76  Pulse: 68 74 78 70  Resp: 17 16 17 18   Temp: 98.1 F (36.7 C) 98.2 F (36.8 C) 97.7 F (36.5 C) 98.8 F (37.1 C)  TempSrc: Oral Oral Oral Oral  SpO2: 100% 100% 100% 100%  Weight:   207 lb 4.8 oz (94 kg)   Height:       General: Resting in chair comfortably HEENT: /AT Neuro: alert and oriented X3, cranial nerves II-XII grossly intact with mild dysarthria; no focal neuro deficits Extremities: Left upper arm with mild TTP, incision clean and dry; palpable thrill; left hand strength 5/5, sensation intact Skin: umbilicated, hyperkeratotic papules, some with a central, white crust diffusely scattered on patients arms and legs  Assessment/Plan:  Principal Problem:   Chest pain Active Problems:   Essential hypertension   End-stage renal disease on hemodialysis (HCC)   Obesity   Dysarthria   Lacunar infarct, acute  Dysarthria associated with HD MRI abnormalities Patient's dysarthria improved, alert and oriented. Repeat MRI showed tiny acute infarct in the left pons, remainder of pontine and brachium pontis signal abnormality is likely from prior ischemic injury, known small acute infarct in the right parietal white matter, small subacute to chronic infarct in the right corpus. Stroke work up completed previously. No further work up indicated at this time.  -Patient accepted to SNF today -Continue Aspirin 325 mg, Atorvastatin 80 mg -A1C 8.1 unchanged from prior; lipid panel WNL  -Continue PT/OT   T2DM BG this AM 76. No other episodes of hypoglycemia.  -Will d/c on 10 units of lantus  -SSI -CBG monitoring   Hypertension 158/85. BP meds were help after stroke findings to allow for permissive HTN. Will  continue Amlodipine to 10 mg at d/c.    ESRD on HD Post op Day 5 upper arm AV graft. Site healing well; palpable and audible thrill present. -Nephro managing HD, appreciate recs - next HD to be on Thursday  Rash, Hyperkeratotic papules Chronic, patient has had them since her left BKA several years ago. 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  -Asymptomatic and stable   Dispo: Anticipated discharge today.    Melanee Spry, MD 09/15/2017, 10:53 AM Pager: [phone]

## 2017-09-15 NOTE — Progress Notes (Signed)
Pt discharged to [facility]. Report called to [place] at receiving facility. Pt left unit via stretcher at approximately 1525, accompanied by PTAR.

## 2017-09-15 NOTE — NC FL2 (Signed)
[place] LEVEL OF CARE SCREENING TOOL     IDENTIFICATION  Patient Name: Karen [name] Birthdate: 18-Mar-1976 Sex: female Admission Date (Current Location): 09/07/2017  [facility] and [place] Number:  [employer] and Address:  [facility], [place] [address] [place], [place] [address]      Provider Number: [id number]  Attending Physician Name and Address:  Lucious Groves, DO  Relative Name and Phone Number:  Netta Neat, [phone]    Current Level of Care: Hospital Recommended Level of Care: [facility] Prior Approval Number:    Date Approved/Denied:   PASRR Number: [id number]  Discharge Plan: SNF    Current Diagnoses: Patient Active Problem List   Diagnosis Date Noted  . Dysarthria 09/14/2017  . Lacunar infarct, acute 09/14/2017  . Obesity 09/09/2017  . Chest pain 09/07/2017  . Fluid overload 10/03/2016  . Hyperkalemia 10/03/2016  . Hypertension with fluid overload 10/03/2016  . Anemia in other chronic diseases classified elsewhere 09/08/2016  . High anion gap metabolic acidosis 72/05/4708  . Right lower lobe pneumonia ([place]) 09/08/2016  . Uncontrolled type 2 diabetes mellitus with chronic kidney disease on chronic dialysis, with long-term current use of insulin ([place]) 01/31/2016  . Noncompliance with medications   . End-stage renal disease on hemodialysis ([place])   . Hx of BKA ([place]) 07/03/2013  . S/P BKA (below knee amputation) unilateral ([place]) 06/30/2013  . Physical deconditioning 06/30/2013  . Cardiomyopathy, ischemic - EF 45-50% with inf WMA by 2D 02/05/13 02/06/2013  . Essential hypertension 02/05/2013  . PAD (peripheral artery disease) ([place]) 02/05/2013  . Diabetic neuropathy ([place])   . Osteomyelitis of left great toe - S/P amputation 02/06/13 04/21/2011    Orientation RESPIRATION BLADDER Height & Weight     Self, Time, Situation, Place  Normal Continent Weight: 207 lb 4.8 oz (94 kg) Height:  5\' 6"   (167.6 cm)  BEHAVIORAL SYMPTOMS/MOOD NEUROLOGICAL BOWEL NUTRITION STATUS      Continent Diet(please see DC summary)  AMBULATORY STATUS COMMUNICATION OF NEEDS Skin   Extensive Assist Verbally Surgical wounds(closed incision L arm, liquid skin adhesive)                       Personal Care Assistance Level of Assistance  Bathing, Feeding, Dressing Bathing Assistance: Maximum assistance Feeding assistance: Limited assistance Dressing Assistance: Maximum assistance     Functional Limitations Info  Sight, Hearing, Speech Sight Info: Adequate Hearing Info: Adequate Speech Info: Adequate    SPECIAL CARE FACTORS FREQUENCY  OT (By licensed OT), PT (By licensed PT)     PT Frequency: 5x/week OT Frequency: 5x/week            Contractures Contractures Info: Not present    Additional Factors Info  Code Status, Allergies, Insulin Sliding Scale Code Status Info: Full Allergies Info: Penicillins, Morphine And Related, Novolog Insulin Aspart   Insulin Sliding Scale Info: insulin novolog 3x/day with meals, insulin lantus at bedtime       Current Medications (09/15/2017):  This is the current hospital active medication list Current Facility-Administered Medications  Medication Dose Route Frequency Provider Last Rate Last Dose  . 0.9 %  sodium chloride infusion   Intravenous Continuous Lillia Abed, MD      . acetaminophen (TYLENOL) tablet 650 mg  650 mg Oral Q6H PRN Rice, Resa Miner, MD       Or  . acetaminophen (TYLENOL) suppository 650 mg  650 mg Rectal Q6H PRN  Collier Salina, MD      . aspirin tablet 325 mg  325 mg Oral Daily Melanee Spry, MD   325 mg at 09/15/17 0805  . atorvastatin (LIPITOR) tablet 80 mg  80 mg Oral q1800 Melanee Spry, MD   80 mg at 09/14/17 2242  . calcitRIOL (ROCALTROL) capsule 1.5 mcg  1.5 mcg Oral Q T,Th,Sa-HD Alric Seton, PA-C   1.5 mcg at 09/14/17 1218  . calcium acetate (PHOSLO) capsule 1,334 mg  1,334 mg Oral TID WC Collier Salina, MD   1,334 mg at 09/15/17 0757  . cinacalcet (SENSIPAR) tablet 30 mg  30 mg Oral Q T,Th,Sa-HD Alric Seton, PA-C   30 mg at 09/14/17 1219  . [START ON 09/16/2017] Darbepoetin Alfa (ARANESP) injection 150 mcg  150 mcg Intravenous Q Thu-HD Alric Seton, PA-C      . diclofenac sodium (VOLTAREN) 1 % transdermal gel 4 g  4 g Topical QID PRN Melanee Spry, MD      . furosemide (LASIX) tablet 20 mg  20 mg Oral Daily Angelia Mould, MD   20 mg at 09/15/17 0805  . hydrALAZINE (APRESOLINE) injection 5 mg  5 mg Intravenous Q4H PRN Helberg, Justin, MD      . insulin aspart (novoLOG) injection 0-9 Units  0-9 Units Subcutaneous TID WC Collier Salina, MD   2 Units at 09/14/17 [phone]  . insulin glargine (LANTUS) injection 10 Units  10 Units Subcutaneous QHS Melanee Spry, MD   10 Units at 09/14/17 2247  . LORazepam (ATIVAN) tablet 0.5 mg  0.5 mg Oral Q30 min PRN Melanee Spry, MD   0.5 mg at 09/14/17 1238  . metoprolol succinate (TOPROL-XL) 24 hr tablet 25 mg  25 mg Oral Daily Angelia Mould, MD   25 mg at 09/15/17 0805  . multivitamin (RENA-VIT) tablet 1 tablet  1 tablet Oral QHS Alric Seton, PA-C   1 tablet at 09/14/17 2241  . ondansetron (ZOFRAN-ODT) disintegrating tablet 4 mg  4 mg Oral Q8H PRN Alric Seton, PA-C   4 mg at 09/08/17 1112   Facility-Administered Medications Ordered in Other Encounters  Medication Dose Route Frequency Provider Last Rate Last Dose  . 0.9 %  sodium chloride infusion   Intravenous Continuous Early, Arvilla Meres, MD      . 0.9 %  sodium chloride infusion   Intravenous Continuous Early, Arvilla Meres, MD         Discharge Medications: Please see discharge summary for a list of discharge medications.  Relevant Imaging Results:  Relevant Lab Results:   Additional Information SSN: [id number]  Estanislado Emms, LCSW

## 2017-09-15 NOTE — Care Management Important Message (Signed)
Important Message  Patient Details  Name: Karen [name] MRN: [id number] Date of Birth: [birth year]   Medicare Important Message Given:  Yes    Carles Collet, RN 09/15/2017, 9:49 AM

## 2017-09-15 NOTE — Progress Notes (Signed)
Pt currently has an active telemetry order in epic.  Pt refuses to have monitor placed and states, "they discontinued that monitor two days ago and I'm not going to wear it".    Central Telemetry notified.  Will ask oncoming Day shift RN to ask MD to d/c the order in epic.

## 2017-09-16 DIAGNOSIS — E1129 Type 2 diabetes mellitus with other diabetic kidney complication: Secondary | ICD-10-CM | POA: Diagnosis not present

## 2017-09-16 DIAGNOSIS — D509 Iron deficiency anemia, unspecified: Secondary | ICD-10-CM | POA: Diagnosis not present

## 2017-09-16 DIAGNOSIS — N2581 Secondary hyperparathyroidism of renal origin: Secondary | ICD-10-CM | POA: Diagnosis not present

## 2017-09-16 DIAGNOSIS — B957 Other staphylococcus as the cause of diseases classified elsewhere: Secondary | ICD-10-CM | POA: Diagnosis not present

## 2017-09-16 DIAGNOSIS — N186 End stage renal disease: Secondary | ICD-10-CM | POA: Diagnosis not present

## 2017-09-16 DIAGNOSIS — E119 Type 2 diabetes mellitus without complications: Secondary | ICD-10-CM | POA: Diagnosis not present

## 2017-09-16 DIAGNOSIS — I10 Essential (primary) hypertension: Secondary | ICD-10-CM | POA: Diagnosis not present

## 2017-09-16 DIAGNOSIS — R471 Dysarthria and anarthria: Secondary | ICD-10-CM | POA: Diagnosis not present

## 2017-09-16 DIAGNOSIS — T8249XA Other complication of vascular dialysis catheter, initial encounter: Secondary | ICD-10-CM | POA: Diagnosis not present

## 2017-09-17 DIAGNOSIS — N186 End stage renal disease: Secondary | ICD-10-CM | POA: Diagnosis not present

## 2017-09-17 DIAGNOSIS — B957 Other staphylococcus as the cause of diseases classified elsewhere: Secondary | ICD-10-CM | POA: Diagnosis not present

## 2017-09-17 DIAGNOSIS — T8249XA Other complication of vascular dialysis catheter, initial encounter: Secondary | ICD-10-CM | POA: Diagnosis not present

## 2017-09-17 DIAGNOSIS — D509 Iron deficiency anemia, unspecified: Secondary | ICD-10-CM | POA: Diagnosis not present

## 2017-09-17 DIAGNOSIS — E1129 Type 2 diabetes mellitus with other diabetic kidney complication: Secondary | ICD-10-CM | POA: Diagnosis not present

## 2017-09-17 DIAGNOSIS — N2581 Secondary hyperparathyroidism of renal origin: Secondary | ICD-10-CM | POA: Diagnosis not present

## 2017-09-19 DIAGNOSIS — N2581 Secondary hyperparathyroidism of renal origin: Secondary | ICD-10-CM | POA: Diagnosis not present

## 2017-09-19 DIAGNOSIS — B957 Other staphylococcus as the cause of diseases classified elsewhere: Secondary | ICD-10-CM | POA: Diagnosis not present

## 2017-09-19 DIAGNOSIS — E1129 Type 2 diabetes mellitus with other diabetic kidney complication: Secondary | ICD-10-CM | POA: Diagnosis not present

## 2017-09-19 DIAGNOSIS — D509 Iron deficiency anemia, unspecified: Secondary | ICD-10-CM | POA: Diagnosis not present

## 2017-09-19 DIAGNOSIS — N186 End stage renal disease: Secondary | ICD-10-CM | POA: Diagnosis not present

## 2017-09-19 DIAGNOSIS — T8249XA Other complication of vascular dialysis catheter, initial encounter: Secondary | ICD-10-CM | POA: Diagnosis not present

## 2017-09-20 DIAGNOSIS — E1122 Type 2 diabetes mellitus with diabetic chronic kidney disease: Secondary | ICD-10-CM | POA: Diagnosis not present

## 2017-09-20 DIAGNOSIS — N186 End stage renal disease: Secondary | ICD-10-CM | POA: Diagnosis not present

## 2017-09-20 DIAGNOSIS — Z992 Dependence on renal dialysis: Secondary | ICD-10-CM | POA: Diagnosis not present

## 2017-09-22 DIAGNOSIS — N186 End stage renal disease: Secondary | ICD-10-CM | POA: Diagnosis not present

## 2017-09-22 DIAGNOSIS — D631 Anemia in chronic kidney disease: Secondary | ICD-10-CM | POA: Diagnosis not present

## 2017-09-22 DIAGNOSIS — D509 Iron deficiency anemia, unspecified: Secondary | ICD-10-CM | POA: Diagnosis not present

## 2017-09-22 DIAGNOSIS — N2581 Secondary hyperparathyroidism of renal origin: Secondary | ICD-10-CM | POA: Diagnosis not present

## 2017-09-23 ENCOUNTER — Ambulatory Visit (INDEPENDENT_AMBULATORY_CARE_PROVIDER_SITE_OTHER): Payer: Medicare Other | Admitting: Vascular Surgery

## 2017-09-23 ENCOUNTER — Encounter: Payer: Self-pay | Admitting: Vascular Surgery

## 2017-09-23 DIAGNOSIS — E119 Type 2 diabetes mellitus without complications: Secondary | ICD-10-CM | POA: Diagnosis not present

## 2017-09-23 DIAGNOSIS — R471 Dysarthria and anarthria: Secondary | ICD-10-CM | POA: Diagnosis not present

## 2017-09-23 DIAGNOSIS — N186 End stage renal disease: Secondary | ICD-10-CM | POA: Diagnosis not present

## 2017-09-23 DIAGNOSIS — Z992 Dependence on renal dialysis: Secondary | ICD-10-CM

## 2017-09-23 DIAGNOSIS — I10 Essential (primary) hypertension: Secondary | ICD-10-CM | POA: Diagnosis not present

## 2017-09-23 NOTE — Progress Notes (Signed)
   Patient name: Karen [name] MRN: [id number] DOB: 06-30-1976 Sex: female  REASON FOR VISIT: Follow-up left upper arm AV graft placement on 09/10/2017  HPI: [name] is a 42 y.o. female here for follow-up.  She had a new arm AV Gore-Tex graft placed 09/10/2017.  Had had a prior fistula in her right brachiocephalic which occluded.  She has had no difficulty with her left arm access and no steel symptoms.  She is using a tunneled catheter is having no difficulty with this  Current Outpatient Medications  Medication Sig Dispense Refill  . amLODipine (NORVASC) 5 MG tablet Take 2 tablets (10 mg total) by mouth daily. 30 tablet 0  . aspirin 325 MG tablet Take 1 tablet (325 mg total) by mouth daily. 30 tablet 0  . atorvastatin (LIPITOR) 80 MG tablet TAKE 1 TABLET(80 MG) BY MOUTH DAILY AT 6 PM 90 tablet 0  . calcitRIOL (ROCALTROL) 0.5 MCG capsule Take 3 capsules (1.5 mcg total) by mouth Every Tuesday,Thursday,and Saturday with dialysis. 90 capsule 0  . calcium acetate (PHOSLO) 667 MG capsule Take 2 capsules (1,334 mg total) by mouth 3 (three) times daily with meals. 90 capsule 0  . cinacalcet (SENSIPAR) 30 MG tablet Take 1 tablet (30 mg total) by mouth Every Tuesday,Thursday,and Saturday with dialysis. 60 tablet 0  . Darbepoetin Alfa (ARANESP) 150 MCG/0.3ML SOSY injection Inject 0.3 mLs (150 mcg total) into the vein every Thursday with hemodialysis. 1.68 mL 0  . insulin glargine (LANTUS) 100 UNIT/ML injection Inject 0.1 mLs (10 Units total) into the skin at bedtime. 10 mL 11  . insulin lispro (HUMALOG) 100 UNIT/ML injection Inject 2-5 Units into the skin 3 (three) times daily as needed for high blood sugar. Per sliding scale if blood sugar is over 150    . multivitamin (RENA-VIT) TABS tablet Take 1 tablet by mouth at bedtime. 30 tablet 0   No current facility-administered medications for this visit.    Facility-Administered Medications Ordered in Other Visits    Medication Dose Route Frequency Provider Last Rate Last Dose  . 0.9 %  sodium chloride infusion   Intravenous Continuous Kayda Allers, Arvilla Meres, MD      . 0.9 %  sodium chloride infusion   Intravenous Continuous Dalayah Deahl, Arvilla Meres, MD         PHYSICAL EXAM: There were no vitals filed for this visit.  GENERAL: The patient is a well-nourished female, in no acute distress. The vital signs are documented above. Excellent thrill in her left upper arm graft.  Antecubital and axillary incisions healing without difficulty  MEDICAL ISSUES: Stable overall.  Should be able to have access of her left upper arm AV graft at 1 month out from surgery which would be around January 21.  Will then be able to have removal of her tunneled catheter following this.  Will follow up with [place] on an as needed   Rosetta Posner, MD [facility] Tel [phone] Pager [phone]

## 2017-09-24 ENCOUNTER — Ambulatory Visit: Payer: Medicare Other | Admitting: Vascular Surgery

## 2017-09-24 ENCOUNTER — Encounter: Payer: Self-pay | Admitting: Nephrology

## 2017-09-24 DIAGNOSIS — N2581 Secondary hyperparathyroidism of renal origin: Secondary | ICD-10-CM | POA: Diagnosis not present

## 2017-09-24 DIAGNOSIS — D631 Anemia in chronic kidney disease: Secondary | ICD-10-CM | POA: Diagnosis not present

## 2017-09-24 DIAGNOSIS — D509 Iron deficiency anemia, unspecified: Secondary | ICD-10-CM | POA: Diagnosis not present

## 2017-09-24 DIAGNOSIS — N186 End stage renal disease: Secondary | ICD-10-CM | POA: Diagnosis not present

## 2017-09-27 DIAGNOSIS — D631 Anemia in chronic kidney disease: Secondary | ICD-10-CM | POA: Diagnosis not present

## 2017-09-27 DIAGNOSIS — D509 Iron deficiency anemia, unspecified: Secondary | ICD-10-CM | POA: Diagnosis not present

## 2017-09-27 DIAGNOSIS — R471 Dysarthria and anarthria: Secondary | ICD-10-CM | POA: Diagnosis not present

## 2017-09-27 DIAGNOSIS — I10 Essential (primary) hypertension: Secondary | ICD-10-CM | POA: Diagnosis not present

## 2017-09-27 DIAGNOSIS — N186 End stage renal disease: Secondary | ICD-10-CM | POA: Diagnosis not present

## 2017-09-27 DIAGNOSIS — N2581 Secondary hyperparathyroidism of renal origin: Secondary | ICD-10-CM | POA: Diagnosis not present

## 2017-09-27 DIAGNOSIS — E119 Type 2 diabetes mellitus without complications: Secondary | ICD-10-CM | POA: Diagnosis not present

## 2017-09-29 DIAGNOSIS — N19 Unspecified kidney failure: Secondary | ICD-10-CM | POA: Diagnosis not present

## 2017-09-29 DIAGNOSIS — Z9119 Patient's noncompliance with other medical treatment and regimen: Secondary | ICD-10-CM | POA: Diagnosis not present

## 2017-09-29 DIAGNOSIS — E119 Type 2 diabetes mellitus without complications: Secondary | ICD-10-CM | POA: Diagnosis not present

## 2017-09-29 DIAGNOSIS — F17211 Nicotine dependence, cigarettes, in remission: Secondary | ICD-10-CM | POA: Diagnosis not present

## 2017-09-29 DIAGNOSIS — I10 Essential (primary) hypertension: Secondary | ICD-10-CM | POA: Diagnosis not present

## 2017-09-30 DIAGNOSIS — D631 Anemia in chronic kidney disease: Secondary | ICD-10-CM | POA: Diagnosis not present

## 2017-09-30 DIAGNOSIS — D509 Iron deficiency anemia, unspecified: Secondary | ICD-10-CM | POA: Diagnosis not present

## 2017-09-30 DIAGNOSIS — N186 End stage renal disease: Secondary | ICD-10-CM | POA: Diagnosis not present

## 2017-09-30 DIAGNOSIS — N2581 Secondary hyperparathyroidism of renal origin: Secondary | ICD-10-CM | POA: Diagnosis not present

## 2017-10-11 DIAGNOSIS — I120 Hypertensive chronic kidney disease with stage 5 chronic kidney disease or end stage renal disease: Secondary | ICD-10-CM | POA: Diagnosis present

## 2017-10-11 DIAGNOSIS — E8770 Fluid overload, unspecified: Secondary | ICD-10-CM | POA: Diagnosis not present

## 2017-10-11 DIAGNOSIS — N186 End stage renal disease: Secondary | ICD-10-CM | POA: Diagnosis present

## 2017-10-11 DIAGNOSIS — Z452 Encounter for adjustment and management of vascular access device: Secondary | ICD-10-CM | POA: Diagnosis not present

## 2017-10-11 DIAGNOSIS — E1122 Type 2 diabetes mellitus with diabetic chronic kidney disease: Secondary | ICD-10-CM | POA: Diagnosis present

## 2017-10-11 DIAGNOSIS — R7989 Other specified abnormal findings of blood chemistry: Secondary | ICD-10-CM | POA: Diagnosis present

## 2017-10-11 DIAGNOSIS — D696 Thrombocytopenia, unspecified: Secondary | ICD-10-CM | POA: Diagnosis present

## 2017-10-11 DIAGNOSIS — I10 Essential (primary) hypertension: Secondary | ICD-10-CM | POA: Diagnosis not present

## 2017-10-11 DIAGNOSIS — D631 Anemia in chronic kidney disease: Secondary | ICD-10-CM | POA: Diagnosis present

## 2017-10-11 DIAGNOSIS — I69354 Hemiplegia and hemiparesis following cerebral infarction affecting left non-dominant side: Secondary | ICD-10-CM | POA: Diagnosis not present

## 2017-10-11 DIAGNOSIS — E875 Hyperkalemia: Secondary | ICD-10-CM | POA: Diagnosis present

## 2017-10-11 DIAGNOSIS — S88112S Complete traumatic amputation at level between knee and ankle, left lower leg, sequela: Secondary | ICD-10-CM | POA: Diagnosis not present

## 2017-10-11 DIAGNOSIS — E8779 Other fluid overload: Secondary | ICD-10-CM | POA: Diagnosis present

## 2017-10-11 DIAGNOSIS — I517 Cardiomegaly: Secondary | ICD-10-CM | POA: Diagnosis not present

## 2017-10-11 DIAGNOSIS — Z992 Dependence on renal dialysis: Secondary | ICD-10-CM | POA: Diagnosis not present

## 2017-10-11 DIAGNOSIS — Z89512 Acquired absence of left leg below knee: Secondary | ICD-10-CM | POA: Diagnosis not present

## 2017-10-11 DIAGNOSIS — I160 Hypertensive urgency: Secondary | ICD-10-CM | POA: Diagnosis present

## 2017-10-11 DIAGNOSIS — R0602 Shortness of breath: Secondary | ICD-10-CM | POA: Diagnosis not present

## 2017-10-11 DIAGNOSIS — R778 Other specified abnormalities of plasma proteins: Secondary | ICD-10-CM | POA: Diagnosis not present

## 2017-10-11 DIAGNOSIS — I69322 Dysarthria following cerebral infarction: Secondary | ICD-10-CM | POA: Diagnosis not present

## 2017-10-15 DIAGNOSIS — I69359 Hemiplegia and hemiparesis following cerebral infarction affecting unspecified side: Secondary | ICD-10-CM | POA: Diagnosis not present

## 2017-10-15 DIAGNOSIS — I519 Heart disease, unspecified: Secondary | ICD-10-CM | POA: Diagnosis not present

## 2017-10-15 DIAGNOSIS — I081 Rheumatic disorders of both mitral and tricuspid valves: Secondary | ICD-10-CM | POA: Diagnosis not present

## 2017-10-15 DIAGNOSIS — R110 Nausea: Secondary | ICD-10-CM | POA: Diagnosis not present

## 2017-10-15 DIAGNOSIS — R05 Cough: Secondary | ICD-10-CM | POA: Diagnosis not present

## 2017-10-15 DIAGNOSIS — Z880 Allergy status to penicillin: Secondary | ICD-10-CM | POA: Insufficient documentation

## 2017-10-15 DIAGNOSIS — R918 Other nonspecific abnormal finding of lung field: Secondary | ICD-10-CM | POA: Diagnosis not present

## 2017-10-15 DIAGNOSIS — E8770 Fluid overload, unspecified: Secondary | ICD-10-CM | POA: Diagnosis present

## 2017-10-15 DIAGNOSIS — E1122 Type 2 diabetes mellitus with diabetic chronic kidney disease: Secondary | ICD-10-CM | POA: Diagnosis present

## 2017-10-15 DIAGNOSIS — N186 End stage renal disease: Secondary | ICD-10-CM | POA: Diagnosis present

## 2017-10-15 DIAGNOSIS — R471 Dysarthria and anarthria: Secondary | ICD-10-CM | POA: Diagnosis not present

## 2017-10-15 DIAGNOSIS — Z6835 Body mass index (BMI) 35.0-35.9, adult: Secondary | ICD-10-CM | POA: Diagnosis not present

## 2017-10-15 DIAGNOSIS — I071 Rheumatic tricuspid insufficiency: Secondary | ICD-10-CM | POA: Diagnosis not present

## 2017-10-15 DIAGNOSIS — I69322 Dysarthria following cerebral infarction: Secondary | ICD-10-CM | POA: Diagnosis not present

## 2017-10-15 DIAGNOSIS — E118 Type 2 diabetes mellitus with unspecified complications: Secondary | ICD-10-CM | POA: Diagnosis not present

## 2017-10-15 DIAGNOSIS — Z992 Dependence on renal dialysis: Secondary | ICD-10-CM | POA: Diagnosis not present

## 2017-10-15 DIAGNOSIS — I10 Essential (primary) hypertension: Secondary | ICD-10-CM | POA: Diagnosis not present

## 2017-10-15 DIAGNOSIS — K219 Gastro-esophageal reflux disease without esophagitis: Secondary | ICD-10-CM | POA: Diagnosis present

## 2017-10-15 DIAGNOSIS — E875 Hyperkalemia: Secondary | ICD-10-CM | POA: Diagnosis present

## 2017-10-15 DIAGNOSIS — R739 Hyperglycemia, unspecified: Secondary | ICD-10-CM | POA: Diagnosis not present

## 2017-10-15 DIAGNOSIS — Z794 Long term (current) use of insulin: Secondary | ICD-10-CM | POA: Diagnosis not present

## 2017-10-15 DIAGNOSIS — E669 Obesity, unspecified: Secondary | ICD-10-CM | POA: Diagnosis present

## 2017-10-15 DIAGNOSIS — G9340 Encephalopathy, unspecified: Secondary | ICD-10-CM | POA: Diagnosis present

## 2017-10-15 DIAGNOSIS — D696 Thrombocytopenia, unspecified: Secondary | ICD-10-CM | POA: Insufficient documentation

## 2017-10-15 DIAGNOSIS — I313 Pericardial effusion (noninflammatory): Secondary | ICD-10-CM | POA: Diagnosis not present

## 2017-10-15 DIAGNOSIS — D631 Anemia in chronic kidney disease: Secondary | ICD-10-CM | POA: Diagnosis present

## 2017-10-15 DIAGNOSIS — I120 Hypertensive chronic kidney disease with stage 5 chronic kidney disease or end stage renal disease: Secondary | ICD-10-CM | POA: Diagnosis present

## 2017-10-15 DIAGNOSIS — Z89512 Acquired absence of left leg below knee: Secondary | ICD-10-CM | POA: Diagnosis not present

## 2017-10-15 DIAGNOSIS — A4102 Sepsis due to Methicillin resistant Staphylococcus aureus: Secondary | ICD-10-CM | POA: Diagnosis present

## 2017-10-15 DIAGNOSIS — R531 Weakness: Secondary | ICD-10-CM | POA: Diagnosis not present

## 2017-10-15 DIAGNOSIS — I69354 Hemiplegia and hemiparesis following cerebral infarction affecting left non-dominant side: Secondary | ICD-10-CM | POA: Diagnosis not present

## 2017-10-15 DIAGNOSIS — J811 Chronic pulmonary edema: Secondary | ICD-10-CM | POA: Diagnosis not present

## 2017-10-15 DIAGNOSIS — A419 Sepsis, unspecified organism: Secondary | ICD-10-CM | POA: Diagnosis not present

## 2017-10-15 DIAGNOSIS — R7881 Bacteremia: Secondary | ICD-10-CM | POA: Diagnosis not present

## 2017-10-15 DIAGNOSIS — I083 Combined rheumatic disorders of mitral, aortic and tricuspid valves: Secondary | ICD-10-CM | POA: Diagnosis not present

## 2017-10-15 DIAGNOSIS — F424 Excoriation (skin-picking) disorder: Secondary | ICD-10-CM | POA: Diagnosis present

## 2017-10-15 DIAGNOSIS — R509 Fever, unspecified: Secondary | ICD-10-CM | POA: Diagnosis not present

## 2017-10-15 DIAGNOSIS — I517 Cardiomegaly: Secondary | ICD-10-CM | POA: Diagnosis not present

## 2017-10-28 DIAGNOSIS — R1032 Left lower quadrant pain: Secondary | ICD-10-CM | POA: Diagnosis not present

## 2017-10-28 DIAGNOSIS — N186 End stage renal disease: Secondary | ICD-10-CM | POA: Diagnosis not present

## 2017-10-28 DIAGNOSIS — E1122 Type 2 diabetes mellitus with diabetic chronic kidney disease: Secondary | ICD-10-CM | POA: Diagnosis not present

## 2017-10-28 DIAGNOSIS — Z8673 Personal history of transient ischemic attack (TIA), and cerebral infarction without residual deficits: Secondary | ICD-10-CM | POA: Diagnosis not present

## 2017-10-28 DIAGNOSIS — K5900 Constipation, unspecified: Secondary | ICD-10-CM | POA: Diagnosis not present

## 2017-10-28 DIAGNOSIS — Z794 Long term (current) use of insulin: Secondary | ICD-10-CM | POA: Diagnosis not present

## 2017-10-28 DIAGNOSIS — I120 Hypertensive chronic kidney disease with stage 5 chronic kidney disease or end stage renal disease: Secondary | ICD-10-CM | POA: Diagnosis not present

## 2017-10-28 DIAGNOSIS — I700 Atherosclerosis of aorta: Secondary | ICD-10-CM | POA: Diagnosis not present

## 2017-10-28 DIAGNOSIS — Z79899 Other long term (current) drug therapy: Secondary | ICD-10-CM | POA: Diagnosis not present

## 2017-11-01 DIAGNOSIS — A4102 Sepsis due to Methicillin resistant Staphylococcus aureus: Secondary | ICD-10-CM | POA: Insufficient documentation

## 2017-11-02 DIAGNOSIS — K219 Gastro-esophageal reflux disease without esophagitis: Secondary | ICD-10-CM | POA: Diagnosis not present

## 2017-11-02 DIAGNOSIS — E119 Type 2 diabetes mellitus without complications: Secondary | ICD-10-CM | POA: Diagnosis not present

## 2017-11-02 DIAGNOSIS — R404 Transient alteration of awareness: Secondary | ICD-10-CM | POA: Diagnosis not present

## 2017-11-02 DIAGNOSIS — R531 Weakness: Secondary | ICD-10-CM | POA: Diagnosis not present

## 2017-11-02 DIAGNOSIS — D631 Anemia in chronic kidney disease: Secondary | ICD-10-CM | POA: Diagnosis not present

## 2017-11-02 DIAGNOSIS — Z79891 Long term (current) use of opiate analgesic: Secondary | ICD-10-CM | POA: Diagnosis not present

## 2017-11-02 DIAGNOSIS — N186 End stage renal disease: Secondary | ICD-10-CM | POA: Diagnosis not present

## 2017-11-02 DIAGNOSIS — Z992 Dependence on renal dialysis: Secondary | ICD-10-CM | POA: Diagnosis not present

## 2017-11-02 DIAGNOSIS — Z79899 Other long term (current) drug therapy: Secondary | ICD-10-CM | POA: Diagnosis not present

## 2017-11-02 DIAGNOSIS — R079 Chest pain, unspecified: Secondary | ICD-10-CM | POA: Diagnosis not present

## 2017-11-02 DIAGNOSIS — E1165 Type 2 diabetes mellitus with hyperglycemia: Secondary | ICD-10-CM | POA: Diagnosis not present

## 2017-11-02 DIAGNOSIS — Z794 Long term (current) use of insulin: Secondary | ICD-10-CM | POA: Diagnosis not present

## 2017-11-02 DIAGNOSIS — N2581 Secondary hyperparathyroidism of renal origin: Secondary | ICD-10-CM | POA: Diagnosis not present

## 2017-11-02 DIAGNOSIS — A4102 Sepsis due to Methicillin resistant Staphylococcus aureus: Secondary | ICD-10-CM | POA: Diagnosis not present

## 2017-11-02 DIAGNOSIS — L03115 Cellulitis of right lower limb: Secondary | ICD-10-CM | POA: Diagnosis not present

## 2017-11-02 DIAGNOSIS — E1122 Type 2 diabetes mellitus with diabetic chronic kidney disease: Secondary | ICD-10-CM | POA: Diagnosis not present

## 2017-11-02 DIAGNOSIS — I10 Essential (primary) hypertension: Secondary | ICD-10-CM | POA: Diagnosis not present

## 2017-11-03 DIAGNOSIS — G8929 Other chronic pain: Secondary | ICD-10-CM | POA: Diagnosis not present

## 2017-11-03 DIAGNOSIS — M545 Low back pain: Secondary | ICD-10-CM | POA: Diagnosis not present

## 2017-11-04 DIAGNOSIS — D631 Anemia in chronic kidney disease: Secondary | ICD-10-CM | POA: Diagnosis not present

## 2017-11-04 DIAGNOSIS — N2581 Secondary hyperparathyroidism of renal origin: Secondary | ICD-10-CM | POA: Diagnosis not present

## 2017-11-04 DIAGNOSIS — A4102 Sepsis due to Methicillin resistant Staphylococcus aureus: Secondary | ICD-10-CM | POA: Diagnosis not present

## 2017-11-04 DIAGNOSIS — N186 End stage renal disease: Secondary | ICD-10-CM | POA: Diagnosis not present

## 2017-11-10 DIAGNOSIS — B999 Unspecified infectious disease: Secondary | ICD-10-CM | POA: Diagnosis not present

## 2017-11-10 DIAGNOSIS — J90 Pleural effusion, not elsewhere classified: Secondary | ICD-10-CM | POA: Diagnosis not present

## 2017-11-10 DIAGNOSIS — Z794 Long term (current) use of insulin: Secondary | ICD-10-CM | POA: Diagnosis not present

## 2017-11-10 DIAGNOSIS — N186 End stage renal disease: Secondary | ICD-10-CM | POA: Diagnosis not present

## 2017-11-10 DIAGNOSIS — I120 Hypertensive chronic kidney disease with stage 5 chronic kidney disease or end stage renal disease: Secondary | ICD-10-CM | POA: Diagnosis not present

## 2017-11-10 DIAGNOSIS — K5900 Constipation, unspecified: Secondary | ICD-10-CM | POA: Diagnosis not present

## 2017-11-10 DIAGNOSIS — E1122 Type 2 diabetes mellitus with diabetic chronic kidney disease: Secondary | ICD-10-CM | POA: Diagnosis not present

## 2017-11-10 DIAGNOSIS — L8992 Pressure ulcer of unspecified site, stage 2: Secondary | ICD-10-CM | POA: Diagnosis not present

## 2017-11-10 DIAGNOSIS — M79669 Pain in unspecified lower leg: Secondary | ICD-10-CM | POA: Diagnosis not present

## 2017-11-10 DIAGNOSIS — M79604 Pain in right leg: Secondary | ICD-10-CM | POA: Diagnosis not present

## 2017-11-10 DIAGNOSIS — Z992 Dependence on renal dialysis: Secondary | ICD-10-CM | POA: Diagnosis not present

## 2017-11-10 DIAGNOSIS — E1165 Type 2 diabetes mellitus with hyperglycemia: Secondary | ICD-10-CM | POA: Diagnosis not present

## 2017-11-11 ENCOUNTER — Other Ambulatory Visit: Payer: Self-pay

## 2017-11-11 ENCOUNTER — Inpatient Hospital Stay (HOSPITAL_COMMUNITY)
Admission: AD | Admit: 2017-11-11 | Discharge: 2017-11-13 | DRG: 640 | Disposition: A | Discharge status: Home / Self Care | Payer: Medicare Other | Source: Other Acute Inpatient Hospital | Attending: Internal Medicine | Admitting: Internal Medicine

## 2017-11-11 ENCOUNTER — Encounter (HOSPITAL_COMMUNITY): Payer: Self-pay | Admitting: *Deleted

## 2017-11-11 DIAGNOSIS — Z9115 Patient's noncompliance with renal dialysis: Secondary | ICD-10-CM

## 2017-11-11 DIAGNOSIS — D638 Anemia in other chronic diseases classified elsewhere: Secondary | ICD-10-CM | POA: Diagnosis present

## 2017-11-11 DIAGNOSIS — R1084 Generalized abdominal pain: Secondary | ICD-10-CM

## 2017-11-11 DIAGNOSIS — J90 Pleural effusion, not elsewhere classified: Secondary | ICD-10-CM | POA: Diagnosis not present

## 2017-11-11 DIAGNOSIS — Z8249 Family history of ischemic heart disease and other diseases of the circulatory system: Secondary | ICD-10-CM

## 2017-11-11 DIAGNOSIS — Z8673 Personal history of transient ischemic attack (TIA), and cerebral infarction without residual deficits: Secondary | ICD-10-CM

## 2017-11-11 DIAGNOSIS — R109 Unspecified abdominal pain: Secondary | ICD-10-CM

## 2017-11-11 DIAGNOSIS — E1122 Type 2 diabetes mellitus with diabetic chronic kidney disease: Secondary | ICD-10-CM | POA: Diagnosis present

## 2017-11-11 DIAGNOSIS — N2581 Secondary hyperparathyroidism of renal origin: Secondary | ICD-10-CM | POA: Diagnosis present

## 2017-11-11 DIAGNOSIS — Z87442 Personal history of urinary calculi: Secondary | ICD-10-CM

## 2017-11-11 DIAGNOSIS — Z87891 Personal history of nicotine dependence: Secondary | ICD-10-CM

## 2017-11-11 DIAGNOSIS — Z885 Allergy status to narcotic agent status: Secondary | ICD-10-CM

## 2017-11-11 DIAGNOSIS — I10 Essential (primary) hypertension: Secondary | ICD-10-CM | POA: Diagnosis not present

## 2017-11-11 DIAGNOSIS — E8770 Fluid overload, unspecified: Secondary | ICD-10-CM | POA: Diagnosis not present

## 2017-11-11 DIAGNOSIS — K219 Gastro-esophageal reflux disease without esophagitis: Secondary | ICD-10-CM | POA: Diagnosis present

## 2017-11-11 DIAGNOSIS — I255 Ischemic cardiomyopathy: Secondary | ICD-10-CM | POA: Diagnosis present

## 2017-11-11 DIAGNOSIS — Z79899 Other long term (current) drug therapy: Secondary | ICD-10-CM

## 2017-11-11 DIAGNOSIS — Z992 Dependence on renal dialysis: Secondary | ICD-10-CM | POA: Diagnosis not present

## 2017-11-11 DIAGNOSIS — Z7982 Long term (current) use of aspirin: Secondary | ICD-10-CM

## 2017-11-11 DIAGNOSIS — Z888 Allergy status to other drugs, medicaments and biological substances status: Secondary | ICD-10-CM

## 2017-11-11 DIAGNOSIS — F419 Anxiety disorder, unspecified: Secondary | ICD-10-CM | POA: Diagnosis present

## 2017-11-11 DIAGNOSIS — E1151 Type 2 diabetes mellitus with diabetic peripheral angiopathy without gangrene: Secondary | ICD-10-CM | POA: Diagnosis present

## 2017-11-11 DIAGNOSIS — Z89512 Acquired absence of left leg below knee: Secondary | ICD-10-CM

## 2017-11-11 DIAGNOSIS — Z880 Allergy status to penicillin: Secondary | ICD-10-CM

## 2017-11-11 DIAGNOSIS — E1140 Type 2 diabetes mellitus with diabetic neuropathy, unspecified: Secondary | ICD-10-CM | POA: Diagnosis present

## 2017-11-11 DIAGNOSIS — D631 Anemia in chronic kidney disease: Secondary | ICD-10-CM | POA: Diagnosis present

## 2017-11-11 DIAGNOSIS — N186 End stage renal disease: Secondary | ICD-10-CM | POA: Diagnosis present

## 2017-11-11 DIAGNOSIS — Z794 Long term (current) use of insulin: Secondary | ICD-10-CM

## 2017-11-11 DIAGNOSIS — I739 Peripheral vascular disease, unspecified: Secondary | ICD-10-CM | POA: Diagnosis present

## 2017-11-11 DIAGNOSIS — K5900 Constipation, unspecified: Secondary | ICD-10-CM | POA: Diagnosis present

## 2017-11-11 DIAGNOSIS — Z833 Family history of diabetes mellitus: Secondary | ICD-10-CM

## 2017-11-11 DIAGNOSIS — Z89519 Acquired absence of unspecified leg below knee: Secondary | ICD-10-CM

## 2017-11-11 DIAGNOSIS — E785 Hyperlipidemia, unspecified: Secondary | ICD-10-CM | POA: Diagnosis present

## 2017-11-11 DIAGNOSIS — I120 Hypertensive chronic kidney disease with stage 5 chronic kidney disease or end stage renal disease: Secondary | ICD-10-CM | POA: Diagnosis present

## 2017-11-11 LAB — CBC WITH DIFFERENTIAL/PLATELET
BASOS PCT: 0 %
Basophils Absolute: 0 10*3/uL (ref 0.0–0.1)
EOS ABS: 0.1 10*3/uL (ref 0.0–0.7)
EOS PCT: 2 %
HCT: 22.5 % — ABNORMAL LOW (ref 36.0–46.0)
HEMOGLOBIN: 7.1 g/dL — AB (ref 12.0–15.0)
LYMPHS ABS: 1.5 10*3/uL (ref 0.7–4.0)
Lymphocytes Relative: 27 %
MCH: 27.5 pg (ref 26.0–34.0)
MCHC: 31.6 g/dL (ref 30.0–36.0)
MCV: 87.2 fL (ref 78.0–100.0)
MONO ABS: 0.3 10*3/uL (ref 0.1–1.0)
MONOS PCT: 6 %
Neutro Abs: 3.6 10*3/uL (ref 1.7–7.7)
Neutrophils Relative %: 65 %
PLATELETS: 176 10*3/uL (ref 150–400)
RBC: 2.58 MIL/uL — ABNORMAL LOW (ref 3.87–5.11)
RDW: 19.7 % — AB (ref 11.5–15.5)
WBC: 5.6 10*3/uL (ref 4.0–10.5)

## 2017-11-11 MED ORDER — ONDANSETRON HCL 4 MG/2ML IJ SOLN: 4.0000 mg | Freq: Four times a day (QID) | INTRAMUSCULAR | Status: DC | PRN

## 2017-11-11 MED ORDER — ACETAMINOPHEN 650 MG RE SUPP: 650.0000 mg | Freq: Four times a day (QID) | RECTAL | Status: DC | PRN

## 2017-11-11 MED ORDER — HYDRALAZINE HCL 20 MG/ML IJ SOLN: 10.0000 mg | INTRAMUSCULAR | Status: DC | PRN

## 2017-11-11 MED ORDER — INSULIN LISPRO 100 UNIT/ML ~~LOC~~ SOLN
0.0000 [IU] | Freq: Three times a day (TID) | SUBCUTANEOUS | Status: DC
  Filled 2017-11-11: qty 3

## 2017-11-11 MED ORDER — ONDANSETRON HCL 4 MG PO TABS: 4.0000 mg | ORAL_TABLET | Freq: Four times a day (QID) | ORAL | Status: DC | PRN

## 2017-11-11 MED ORDER — ACETAMINOPHEN 325 MG PO TABS
650.0000 mg | ORAL_TABLET | Freq: Four times a day (QID) | ORAL | Status: DC | PRN
  Administered 2017-11-13: 650 mg via ORAL

## 2017-11-11 MED ORDER — HEPARIN SODIUM (PORCINE) 5000 UNIT/ML IJ SOLN
5000.0000 [IU] | Freq: Three times a day (TID) | INTRAMUSCULAR | Status: DC
  Administered 2017-11-11 – 2017-11-13 (×5): 5000 [IU] via SUBCUTANEOUS
  Filled 2017-11-11 (×5): qty 1

## 2017-11-11 NOTE — H&P (Signed)
History and Physical    Karen [name] QVZ:[id number] DOB: [birth year] DOA: 11/11/2017  PCP: System, Pcp Not In  Patient coming from: Patient was transferred from [facility].  Chief Complaint: Shortness of breath and abdominal discomfort.  HPI: Karen [name] is a 42 y.o. female with history of ESRD on hemodialysis, hypertension, history of stroke, left lower extremity BKA, diabetes mellitus, anemia had presented to the ER at [facility] yesterday with multiple complaints including shortness of breath abdominal discomfort and lower extremity pain.  Patient has not gone to dialysis for 3 weeks since he had transport issues.  In the ER at [facility] patient was found to be having mild fluid overload with elevated blood pressure.  Patient also had abdominal distention with discomfort which was generalized abdominal discomfort.  X-rays revealed which is concerning for constipation.  Patient blood pressure improved with giving her home medications.  Given that patient has not had dialysis and was mildly in fluid overload and patient is being admitted for further observation and transferred to [facility] for dialysis and further management.  Labs at another hospital showed WBC count of 7.6 hemoglobin of 7.3 platelets 163 x-ray of the abdomen showed moderate stool in the proximal colon small volume of stool distally no fecal impaction or bowel obstruction serum pregnancy test was negative metabolic panel showed sodium of 138 potassium 5.3 bicarb of 25 creatinine of 12.7 calcium 9.1 AST 18 ALT 19 total bilirubin 0.7.  ED Course: Patient was a direct admit.  On my exam patient just had a bowel movement.  Not in distress.  Patient does complain of some heartburn.  Denies any productive cough or shortness of breath at rest.  Review of Systems: As per HPI, rest all negative.   Past Medical History:  Diagnosis Date  . Anemia   . Anxiety   . Dyspnea    "when I dont go to dialysis"  .  Eczema   . ESRD (end stage renal disease) ([place])    Hemo - TTHSAT- [place]  . Exertional shortness of breath    "recently; it's fluid" (02/02/2013)  . GERD (gastroesophageal reflux disease)   . Headache   . History of blood transfusion    "last week" (02/02/2013)  . History of kidney stones    passed  . Hypertension   . Ischemic cardiomyopathy    by echo 2014  . Neuropathy   . Neuropathy   . Osteomyelitis of toe of left foot ([place])    "off and on since 2009; no OR" (02/02/2013)  . Pneumonia 09/2016  . Type II diabetes mellitus ([place]) 1995    Past Surgical History:  Procedure Laterality Date  . AMPUTATION Left 02/06/2013   Procedure: AMPUTATION LEFT GREAT TOE;  Surgeon: Wylene Simmer, MD;  Location: [place];  Service: Orthopedics;  Laterality: Left;  . AMPUTATION Left 06/24/2013   Procedure: AMPUTATION BELOW KNEE ;  Surgeon: Wylene Simmer, MD;  Location: [place];  Service: Orthopedics;  Laterality: Left;  . AV FISTULA PLACEMENT Right 02/08/2017   Procedure: CREATION OF RIGHT ARM  BASILIC VEIN TO BRACHIAL ARTERY ARTERIOVENOUS (AV) FISTULA;  Surgeon: Rosetta Posner, MD;  Location: [place] OR;  Service: Vascular;  Laterality: Right;  . AV FISTULA PLACEMENT Left 09/10/2017   Procedure: INSERTION OF ARTERIOVENOUS (AV) GORE-TEX GRAFT  LEFT UPPER ARM;  Surgeon: Angelia Mould, MD;  Location: [place];  Service: Vascular;  Laterality: Left;  . BASCILIC VEIN TRANSPOSITION Right 04/14/2017   Procedure: BASCILIC  VEIN TRANSPOSITION-RIGHT 2ND STAGE;  Surgeon: Rosetta Posner, MD;  Location: [facility] OR;  Service: Vascular;  Laterality: Right;  . CESAREAN SECTION  10/18/2006  . INSERTION OF DIALYSIS CATHETER       reports that she quit smoking about 13 years ago. Her smoking use included cigarettes. She has a 30.00 pack-year smoking history. she has never used smokeless tobacco. She reports that she does not drink alcohol or use drugs.  Allergies  Allergen Reactions  . Penicillins Anaphylaxis, Hives, Rash and Other  (See Comments)    PATIENT HAD A PCN REACTION WITH IMMEDIATE RASH, FACIAL/TONGUE/THROAT SWELLING, SOB, OR LIGHTHEADEDNESS WITH HYPOTENSION:  #  #  #  YES  #  #  #   SEVERE RASH INVOLVING MUCUS MEMBRANES or SKIN NECROSIS: #  #  #  YES  #  #  # Has patient had a PCN reaction that required hospitalization No Has patient had a PCN reaction occurring within the last 10 years: No If all of the above answers are "NO", then may proceed with Cephalosporin use.  09/10/16- tolerated Cefepime   . Morphine And Related Hives and Rash  . Novolog [Insulin Aspart] Other (See Comments)    Cramps/ Gi distress    Family History  Problem Relation Age of Onset  . Hypertension Mother   . Diabetes Mother   . Diabetes Father     Prior to Admission medications   Medication Sig Start Date End Date Taking? Authorizing Provider  amLODipine (NORVASC) 5 MG tablet Take 2 tablets (10 mg total) by mouth daily. 09/15/17   Melanee Spry, MD  aspirin 325 MG tablet Take 1 tablet (325 mg total) by mouth daily. 09/16/17   Melanee Spry, MD  atorvastatin (LIPITOR) 80 MG tablet TAKE 1 TABLET(80 MG) BY MOUTH DAILY AT 6 PM 09/16/17   Lacroce, Hulen Shouts, MD  calcitRIOL (ROCALTROL) 0.5 MCG capsule Take 3 capsules (1.5 mcg total) by mouth Every Tuesday,Thursday,and Saturday with dialysis. 09/16/17   Melanee Spry, MD  calcium acetate (PHOSLO) 667 MG capsule Take 2 capsules (1,334 mg total) by mouth 3 (three) times daily with meals. 09/15/17   Melanee Spry, MD  cinacalcet (SENSIPAR) 30 MG tablet Take 1 tablet (30 mg total) by mouth Every Tuesday,Thursday,and Saturday with dialysis. 09/16/17   Melanee Spry, MD  Darbepoetin Alfa (ARANESP) 150 MCG/0.3ML SOSY injection Inject 0.3 mLs (150 mcg total) into the vein every Thursday with hemodialysis. 09/16/17   Lacroce, Hulen Shouts, MD  insulin glargine (LANTUS) 100 UNIT/ML injection Inject 0.1 mLs (10 Units total) into the skin at bedtime. 09/15/17   Lacroce,  Hulen Shouts, MD  insulin lispro (HUMALOG) 100 UNIT/ML injection Inject 2-5 Units into the skin 3 (three) times daily as needed for high blood sugar. Per sliding scale if blood sugar is over 150    [provider]  multivitamin (RENA-VIT) TABS tablet Take 1 tablet by mouth at bedtime. 09/15/17   Melanee Spry, MD    Physical Exam: Vitals:   11/11/17 2247  BP: (!) 155/75  Pulse: 88  Resp: 20  Temp: 98.4 F (36.9 C)  TempSrc: Oral  SpO2: 98%  Weight: 98.7 kg (217 lb 9.5 oz)  Height: 5\' 6"  (1.676 m)      Constitutional: Moderately built and nourished. Vitals:   11/11/17 2247  BP: (!) 155/75  Pulse: 88  Resp: 20  Temp: 98.4 F (36.9 C)  TempSrc: Oral  SpO2: 98%  Weight: 98.7 kg (217  lb 9.5 oz)  Height: 5\' 6"  (1.676 m)   Eyes: Anicteric no pallor. ENMT: No discharge from the ears eyes nose or mouth. Neck: No mass palpated no JVD appreciated. Respiratory: No rhonchi or crepitations. Cardiovascular: S1-S2 heard no murmurs appreciated. Abdomen: Mildly distended nontender bowel sounds present. Musculoskeletal: Left BKA.  No acute ischemic changes. Skin: Multiple scabs on the lower extremities. Neurologic: Alert awake oriented to time place and person.  Moves all extremities. Psychiatric: Appears normal.  Normal affect.   Labs on Admission: I have personally reviewed following labs and imaging studies  CBC: No results for input(s): WBC, NEUTROABS, HGB, HCT, MCV, PLT in the last 168 hours. Basic Metabolic Panel: No results for input(s): NA, K, CL, CO2, GLUCOSE, BUN, CREATININE, CALCIUM, MG, PHOS in the last 168 hours. GFR: CrCl cannot be calculated (Patient's most recent lab result is older than the maximum 21 days allowed.). Liver Function Tests: No results for input(s): AST, ALT, ALKPHOS, BILITOT, PROT, ALBUMIN in the last 168 hours. No results for input(s): LIPASE, AMYLASE in the last 168 hours. No results for input(s): AMMONIA in the last 168  hours. Coagulation Profile: No results for input(s): INR, PROTIME in the last 168 hours. Cardiac Enzymes: No results for input(s): CKTOTAL, CKMB, CKMBINDEX, TROPONINI in the last 168 hours. BNP (last 3 results) No results for input(s): PROBNP in the last 8760 hours. HbA1C: No results for input(s): HGBA1C in the last 72 hours. CBG: No results for input(s): GLUCAP in the last 168 hours. Lipid Profile: No results for input(s): CHOL, HDL, LDLCALC, TRIG, CHOLHDL, LDLDIRECT in the last 72 hours. Thyroid Function Tests: No results for input(s): TSH, T4TOTAL, FREET4, T3FREE, THYROIDAB in the last 72 hours. Anemia Panel: No results for input(s): VITAMINB12, FOLATE, FERRITIN, TIBC, IRON, RETICCTPCT in the last 72 hours. Urine analysis:    Component Value Date/Time   COLORURINE YELLOW 09/09/2016 [place] 09/09/2016 0554   LABSPEC 1.015 09/09/2016 0554   PHURINE 7.0 09/09/2016 0554   GLUCOSEU >=500 (A) 09/09/2016 0554   HGBUR NEGATIVE 09/09/2016 0554   BILIRUBINUR NEGATIVE 09/09/2016 0554   KETONESUR NEGATIVE 09/09/2016 0554   PROTEINUR >=300 (A) 09/09/2016 0554   UROBILINOGEN 0.2 02/03/2013 0038   NITRITE NEGATIVE 09/09/2016 0554   LEUKOCYTESUR NEGATIVE 09/09/2016 0554   Sepsis Labs: @LABRCNTIP (procalcitonin:4,lacticidven:4) )No results found for this or any previous visit (from the past 240 hour(s)).   Radiological Exams on Admission: No results found.    Assessment/Plan Principal Problem:   Fluid overload, unspecified Active Problems:   PAD (peripheral artery disease) (HCC)   Cardiomyopathy, ischemic - EF 45-50% with inf WMA by 2D 02/05/13   S/P BKA (below knee amputation) unilateral (HCC)   End-stage renal disease on hemodialysis ([place])   Anemia in other chronic diseases classified elsewhere   Hypertension, uncontrolled   Fluid overload   Abdominal pain    1. Fluid overload likely from missing dialysis -we will consult nephrology for dialysis.  Patient  is found in distress.  Will check chest x-ray and EKG. 2. Heartburn -we will check chest x-ray cardiac markers EKG.  Will order Protonix. 3. Hypertension we will resume patient's amlodipine and keep patient on PRN IV hydralazine. 4. Diabetes mellitus type 2 patient is on Lantus with sliding scale coverage. 5. Abdominal discomfort likely from constipation improved after patient moving bowels.  Repeat labs are pending including lipase and LFTs. 6. Anemia likely from ESRD -hemoglobin at transfer was around 7.3 as per the ER physician patient's hemoglobin  usually is around 8.  Follow CBC. 7. History of stroke on aspirin and Plavix.  Has difficulty speaking from the previous stroke. 8. History of hyperlipidemia on statins. 9. Peripheral artery disease status post left BKA with lower extremity pain.  Patient has multiple scabs on the lower extremity suspected to be from flea bites as per the ER physician in [facility].  At this time there is no acute ischemic changes we will check ABI.  Patient is on aspirin and statins.  All labs including metabolic panel CBC cardiac markers lipase EKG chest x-ray and abdominal x-rays are pending.   DVT prophylaxis: Heparin. Code Status: Full code. Family Communication: Discussed with patient. Disposition Plan: Home. Consults called: None. Admission status: Observation.   Rise Patience MD [facility] Pager [phone].  If 7PM-7AM, please contact night-coverage [email or web address] Password [facility]  11/11/2017, 11:43 PM

## 2017-11-12 ENCOUNTER — Observation Stay (HOSPITAL_COMMUNITY): Payer: Medicare Other

## 2017-11-12 ENCOUNTER — Other Ambulatory Visit: Payer: Self-pay

## 2017-11-12 DIAGNOSIS — Z9115 Patient's noncompliance with renal dialysis: Secondary | ICD-10-CM | POA: Diagnosis not present

## 2017-11-12 DIAGNOSIS — I10 Essential (primary) hypertension: Secondary | ICD-10-CM | POA: Diagnosis not present

## 2017-11-12 DIAGNOSIS — Z8249 Family history of ischemic heart disease and other diseases of the circulatory system: Secondary | ICD-10-CM | POA: Diagnosis not present

## 2017-11-12 DIAGNOSIS — E875 Hyperkalemia: Secondary | ICD-10-CM | POA: Diagnosis not present

## 2017-11-12 DIAGNOSIS — Z89512 Acquired absence of left leg below knee: Secondary | ICD-10-CM | POA: Diagnosis not present

## 2017-11-12 DIAGNOSIS — Z880 Allergy status to penicillin: Secondary | ICD-10-CM | POA: Diagnosis not present

## 2017-11-12 DIAGNOSIS — E8770 Fluid overload, unspecified: Principal | ICD-10-CM

## 2017-11-12 DIAGNOSIS — F419 Anxiety disorder, unspecified: Secondary | ICD-10-CM | POA: Diagnosis present

## 2017-11-12 DIAGNOSIS — N186 End stage renal disease: Secondary | ICD-10-CM | POA: Diagnosis not present

## 2017-11-12 DIAGNOSIS — D638 Anemia in other chronic diseases classified elsewhere: Secondary | ICD-10-CM | POA: Diagnosis present

## 2017-11-12 DIAGNOSIS — Z89519 Acquired absence of unspecified leg below knee: Secondary | ICD-10-CM

## 2017-11-12 DIAGNOSIS — I739 Peripheral vascular disease, unspecified: Secondary | ICD-10-CM

## 2017-11-12 DIAGNOSIS — I120 Hypertensive chronic kidney disease with stage 5 chronic kidney disease or end stage renal disease: Secondary | ICD-10-CM | POA: Diagnosis present

## 2017-11-12 DIAGNOSIS — Z992 Dependence on renal dialysis: Secondary | ICD-10-CM | POA: Diagnosis not present

## 2017-11-12 DIAGNOSIS — K219 Gastro-esophageal reflux disease without esophagitis: Secondary | ICD-10-CM | POA: Diagnosis present

## 2017-11-12 DIAGNOSIS — Z87891 Personal history of nicotine dependence: Secondary | ICD-10-CM | POA: Diagnosis not present

## 2017-11-12 DIAGNOSIS — Z8673 Personal history of transient ischemic attack (TIA), and cerebral infarction without residual deficits: Secondary | ICD-10-CM | POA: Diagnosis not present

## 2017-11-12 DIAGNOSIS — M79609 Pain in unspecified limb: Secondary | ICD-10-CM | POA: Diagnosis not present

## 2017-11-12 DIAGNOSIS — E785 Hyperlipidemia, unspecified: Secondary | ICD-10-CM | POA: Diagnosis present

## 2017-11-12 DIAGNOSIS — K5900 Constipation, unspecified: Secondary | ICD-10-CM | POA: Diagnosis present

## 2017-11-12 DIAGNOSIS — Z87442 Personal history of urinary calculi: Secondary | ICD-10-CM | POA: Diagnosis not present

## 2017-11-12 DIAGNOSIS — I255 Ischemic cardiomyopathy: Secondary | ICD-10-CM | POA: Diagnosis not present

## 2017-11-12 DIAGNOSIS — E1140 Type 2 diabetes mellitus with diabetic neuropathy, unspecified: Secondary | ICD-10-CM | POA: Diagnosis present

## 2017-11-12 DIAGNOSIS — Z885 Allergy status to narcotic agent status: Secondary | ICD-10-CM | POA: Diagnosis not present

## 2017-11-12 DIAGNOSIS — R0989 Other specified symptoms and signs involving the circulatory and respiratory systems: Secondary | ICD-10-CM | POA: Diagnosis not present

## 2017-11-12 DIAGNOSIS — D631 Anemia in chronic kidney disease: Secondary | ICD-10-CM | POA: Diagnosis present

## 2017-11-12 DIAGNOSIS — N2581 Secondary hyperparathyroidism of renal origin: Secondary | ICD-10-CM | POA: Diagnosis present

## 2017-11-12 DIAGNOSIS — E1151 Type 2 diabetes mellitus with diabetic peripheral angiopathy without gangrene: Secondary | ICD-10-CM | POA: Diagnosis present

## 2017-11-12 DIAGNOSIS — E1122 Type 2 diabetes mellitus with diabetic chronic kidney disease: Secondary | ICD-10-CM | POA: Diagnosis present

## 2017-11-12 DIAGNOSIS — Z888 Allergy status to other drugs, medicaments and biological substances status: Secondary | ICD-10-CM | POA: Diagnosis not present

## 2017-11-12 LAB — COMPREHENSIVE METABOLIC PANEL
ALK PHOS: 71 U/L (ref 38–126)
ALT: 15 U/L (ref 14–54)
AST: 35 U/L (ref 15–41)
Albumin: 3.2 g/dL — ABNORMAL LOW (ref 3.5–5.0)
Anion gap: 16 — ABNORMAL HIGH (ref 5–15)
BUN: 62 mg/dL — AB (ref 6–20)
CALCIUM: 8.5 mg/dL — AB (ref 8.9–10.3)
CHLORIDE: 99 mmol/L — AB (ref 101–111)
CO2: 21 mmol/L — AB (ref 22–32)
CREATININE: 14.1 mg/dL — AB (ref 0.44–1.00)
GFR calc non Af Amer: 3 mL/min — ABNORMAL LOW (ref >60)
GFR, EST AFRICAN AMERICAN: 3 mL/min — AB (ref >60)
Glucose, Bld: 84 mg/dL (ref 65–99)
Potassium: 6.4 mmol/L (ref 3.5–5.1)
SODIUM: 136 mmol/L (ref 135–145)
Total Bilirubin: 0.9 mg/dL (ref 0.3–1.2)
Total Protein: 7.3 g/dL (ref 6.5–8.1)

## 2017-11-12 LAB — BASIC METABOLIC PANEL
ANION GAP: 18 — AB (ref 5–15)
BUN: 61 mg/dL — ABNORMAL HIGH (ref 6–20)
CALCIUM: 8.3 mg/dL — AB (ref 8.9–10.3)
CO2: 20 mmol/L — AB (ref 22–32)
Chloride: 101 mmol/L (ref 101–111)
Creatinine, Ser: 14.48 mg/dL — ABNORMAL HIGH (ref 0.44–1.00)
GFR calc Af Amer: 3 mL/min — ABNORMAL LOW (ref >60)
GFR calc non Af Amer: 3 mL/min — ABNORMAL LOW (ref >60)
GLUCOSE: 116 mg/dL — AB (ref 65–99)
Potassium: 4.9 mmol/L (ref 3.5–5.1)
Sodium: 139 mmol/L (ref 135–145)

## 2017-11-12 LAB — CBC
HCT: 19.7 % — ABNORMAL LOW (ref 36.0–46.0)
HEMOGLOBIN: 6.1 g/dL — AB (ref 12.0–15.0)
MCH: 27 pg (ref 26.0–34.0)
MCHC: 31 g/dL (ref 30.0–36.0)
MCV: 87.2 fL (ref 78.0–100.0)
Platelets: 155 10*3/uL (ref 150–400)
RBC: 2.26 MIL/uL — ABNORMAL LOW (ref 3.87–5.11)
RDW: 19.6 % — ABNORMAL HIGH (ref 11.5–15.5)
WBC: 5.6 10*3/uL (ref 4.0–10.5)

## 2017-11-12 LAB — GLUCOSE, CAPILLARY
GLUCOSE-CAPILLARY: 73 mg/dL (ref 65–99)
GLUCOSE-CAPILLARY: 87 mg/dL (ref 65–99)
Glucose-Capillary: 69 mg/dL (ref 65–99)
Glucose-Capillary: 102 mg/dL — ABNORMAL HIGH (ref 65–99)
Glucose-Capillary: 130 mg/dL — ABNORMAL HIGH (ref 65–99)
Glucose-Capillary: 129 mg/dL — ABNORMAL HIGH (ref 65–99)

## 2017-11-12 LAB — PREPARE RBC (CROSSMATCH)

## 2017-11-12 LAB — TROPONIN I
TROPONIN I: 0.03 ng/mL — AB (ref <0.03)
Troponin I: 0.03 ng/mL (ref <0.03)

## 2017-11-12 LAB — HCG, SERUM, QUALITATIVE: Preg, Serum: NEGATIVE

## 2017-11-12 LAB — HEPATITIS B SURFACE ANTIGEN: Hepatitis B Surface Ag: NEGATIVE

## 2017-11-12 LAB — LIPASE, BLOOD: LIPASE: 17 U/L (ref 11–51)

## 2017-11-12 MED ORDER — CINACALCET HCL 30 MG PO TABS
30.0000 mg | ORAL_TABLET | ORAL | Status: DC
  Filled 2017-11-12: qty 1

## 2017-11-12 MED ORDER — RENA-VITE PO TABS
1.0000 | ORAL_TABLET | Freq: Every day | ORAL | Status: DC
  Administered 2017-11-12 – 2017-11-13 (×2): 1 via ORAL
  Filled 2017-11-12 (×2): qty 1

## 2017-11-12 MED ORDER — DIPHENHYDRAMINE HCL 25 MG PO CAPS
25.0000 mg | ORAL_CAPSULE | Freq: Every evening | ORAL | Status: DC | PRN
  Administered 2017-11-12: 25 mg via ORAL
  Filled 2017-11-12: qty 1

## 2017-11-12 MED ORDER — INSULIN LISPRO 100 UNIT/ML (KWIKPEN)
0.0000 [IU] | PEN_INJECTOR | Freq: Three times a day (TID) | SUBCUTANEOUS | Status: DC
  Filled 2017-11-12: qty 3

## 2017-11-12 MED ORDER — SODIUM BICARBONATE 8.4 % IV SOLN
50.0000 meq | Freq: Once | INTRAVENOUS | Status: AC
  Administered 2017-11-12: 50 meq via INTRAVENOUS
  Filled 2017-11-12: qty 50

## 2017-11-12 MED ORDER — SODIUM CHLORIDE 0.9 % IV SOLN: Freq: Once | INTRAVENOUS | Status: DC

## 2017-11-12 MED ORDER — INSULIN ASPART 100 UNIT/ML ~~LOC~~ SOLN
0.0000 [IU] | Freq: Three times a day (TID) | SUBCUTANEOUS | Status: DC
  Administered 2017-11-12: 1 [IU] via SUBCUTANEOUS
  Administered 2017-11-13: 2 [IU] via SUBCUTANEOUS

## 2017-11-12 MED ORDER — DARBEPOETIN ALFA 200 MCG/0.4ML IJ SOSY: 200.0000 ug | PREFILLED_SYRINGE | INTRAMUSCULAR | Status: DC

## 2017-11-12 MED ORDER — INSULIN ASPART 100 UNIT/ML ~~LOC~~ SOLN: 0.0000 [IU] | Freq: Every day | SUBCUTANEOUS | Status: DC

## 2017-11-12 MED ORDER — SODIUM POLYSTYRENE SULFONATE 15 GM/60ML PO SUSP
60.0000 g | Freq: Once | ORAL | Status: AC
  Administered 2017-11-12: 60 g via ORAL
  Filled 2017-11-12: qty 240

## 2017-11-12 MED ORDER — PANTOPRAZOLE SODIUM 40 MG IV SOLR
40.0000 mg | INTRAVENOUS | Status: DC
  Administered 2017-11-12 – 2017-11-13 (×2): 40 mg via INTRAVENOUS
  Filled 2017-11-12 (×2): qty 40

## 2017-11-12 MED ORDER — DOXERCALCIFEROL 4 MCG/2ML IV SOLN
5.0000 ug | INTRAVENOUS | Status: DC
  Administered 2017-11-13: 5 ug via INTRAVENOUS
  Filled 2017-11-12: qty 4

## 2017-11-12 MED ORDER — CALCIUM ACETATE (PHOS BINDER) 667 MG PO CAPS
1334.0000 mg | ORAL_CAPSULE | Freq: Three times a day (TID) | ORAL | Status: DC
  Administered 2017-11-12 – 2017-11-13 (×2): 1334 mg via ORAL
  Filled 2017-11-12 (×3): qty 2

## 2017-11-12 NOTE — Progress Notes (Signed)
Dialysis treatment completed.  5000 mL ultrafiltrated and net fluid removal 4000 mL.    Patient status unchanged. Lung sounds diminished to ausculation in all fields. Generalized edema. Cardiac: NSR.  Disconnected lines and removed needles.  Pressure held for 10 minutes and band aid/gauze dressing applied.  Report given to bedside RN, Anderson Malta.  1 unit PRBC administered, tx time extended by 45 minutes to accommodate blood admin.  Second unit for admin at bedside.

## 2017-11-12 NOTE — Progress Notes (Signed)
   11/12/17 1042  Clinical Encounter Type  Visited With Patient;Health care provider  Visit Type Initial  Referral From Nurse  Consult/Referral To Kay   While on the floor the nurse for this patient was going to put in [facility] for the patient to fill out a HCPA to name her cousin as healthcare agent.  Went over the information and we tried to call the cousin to get her contact information.  Were not able to get her on the phone at this time.  We completed the rest of the form and once we have the cousin information we can complete and have notarized.  Will follow and support as needed to meet the patients needs. Chaplain Katherene Ponto

## 2017-11-12 NOTE — Progress Notes (Addendum)
42 yo AAF with ESRD on Chronic HD (TTS ,[facility] Unit) ho noncompliance with HD = only HD in Feb = 14th , 12th , Admitted with hyperkalemia and sob, ( pulmonary  vascular congestiion on CXR ) hgb 7.1 (last hgb 7.6 as op lab 11/04/17 Missing Op Mircera on hd with noncompliance . Other med problems=  hypertension, history of stroke( speech deficits  ), left lower extremity BKA (uses prosthesis ), diabetes mellitus type 2. She claims she moved to [place] without plans for [place] [facility] and transportation issues has caused her problem .   Pt in Observation  =We will order and supervise dialysis tomorrow on schedule. Please advise if patient status upgraded to in-patient and we will consult formally. HD today and tomor to cont TTS schedule    OP HD = [facility] Unit TTS 4hr , 2k, 2.25ca  [place] , EDW @ 94 (left at 93.5 kg last hd) Uf profile 2, L UA AVG, Heparin 6.000 bolus, 2000 mid , Mircera 225 mcg q 2wks (last on 11/04/17 )    HD  med's also= Sensipar 30mg   q hd                               Calcium acetate 667 mg 2 AC   Karen Haber, PA-C [place] 11/12/2017,10:44 AM  LOS: 0 days   Pt seen, examined and agree w A/P as above.  Karen Splinter MD [employer] pager [phone]   11/12/2017, 12:49 PM

## 2017-11-12 NOTE — Progress Notes (Signed)
PROGRESS NOTE    Karen [name]   JSH:[id number]  DOB: December 25, 1975  DOA: 11/11/2017 PCP: System, Pcp Not In   Brief Narrative:  Karen [name]  is a 42 y.o. female with history of ESRD on hemodialysis, hypertension, history of stroke, left lower extremity BKA, diabetes mellitus, anemia had presented to the ER at [facility] yesterday with multiple complaints including shortness of breath abdominal discomfort and lower extremity pain.  Patient has not gone to dialysis for 3 weeks since he had transport issues. She was transferred from [place] to [facility] to undergo dialysis.  Subjective: Patient states she wants to sleep and keeps eyes closed.     Assessment & Plan:   Principal Problem:   Fluid overload, unspecified/  End-stage renal disease on hemodialysis   Cardiomyopathy, ischemic - EF 45-50% with inf WMA by 2D 02/05/13 - will undergo dialysis today-   Active Problems:   PAD (peripheral artery disease)/ S/P BKA (below knee amputation) unilateral    DM2 - Lantus on hold- cont SSI     Anemia in other chronic diseases classified elsewhere - Hb noted to be 6.1- nephrology transfusing 1 u PRBC    Hypertension  - Norvasc on hold     DVT prophylaxis: Heparin Code Status: Full code Family Communication:  Disposition Plan: cont to follow in the hospital Consultants:   nephrology Procedures:    Antimicrobials:  Anti-infectives (From admission, onward)   None       Objective: Vitals:   11/12/17 1300 11/12/17 1330 11/12/17 1400 11/12/17 1430  BP: (!) 181/95 (!) 184/100 (!) 190/99 (!) 183/103  Pulse: 90 86 85 83  Resp:      Temp:      TempSrc:      SpO2:      Weight:      Height:       No intake or output data in the 24 hours ending 11/12/17 1508 Filed Weights   11/11/17 2247 11/12/17 0500 11/12/17 1120  Weight: 98.7 kg (217 lb 9.5 oz) 94.8 kg (208 lb 15.9 oz) 94.8 kg (208 lb 15.9 oz)    Examination: General exam: Appears comfortable  HEENT: PERRLA,  oral mucosa moist, no sclera icterus or thrush Respiratory system: Clear to auscultation. Respiratory effort normal. Cardiovascular system: S1 & S2 heard, RRR.  No murmurs  Gastrointestinal system: Abdomen soft, non-tender, nondistended. Normal bowel sound. No organomegaly Central nervous system: asleep-  No focal neurological deficits. Extremities: No cyanosis, clubbing or edema Skin: No rashes or ulcers      Data Reviewed: I have personally reviewed following labs and imaging studies  CBC: Recent Labs  Lab 11/11/17 2321 11/12/17 1147  WBC 5.6 5.6  NEUTROABS 3.6  --   HGB 7.1* 6.1*  HCT 22.5* 19.7*  MCV 87.2 87.2  PLT 176 850   Basic Metabolic Panel: Recent Labs  Lab 11/11/17 2321 11/12/17 1147  NA 136 139  K 6.4* 4.9  CL 99* 101  CO2 21* 20*  GLUCOSE 84 116*  BUN 62* 61*  CREATININE 14.10* 14.48*  CALCIUM 8.5* 8.3*   GFR: Estimated Creatinine Clearance: 6 mL/min (A) (by C-G formula based on SCr of 14.48 mg/dL (H)). Liver Function Tests: Recent Labs  Lab 11/11/17 2321  AST 35  ALT 15  ALKPHOS 71  BILITOT 0.9  PROT 7.3  ALBUMIN 3.2*   Recent Labs  Lab 11/11/17 2321  LIPASE 17   No results for input(s): AMMONIA in the last 168 hours. Coagulation  Profile: No results for input(s): INR, PROTIME in the last 168 hours. Cardiac Enzymes: Recent Labs  Lab 11/11/17 2321 11/12/17 1147  TROPONINI 0.03* 0.03*   BNP (last 3 results) No results for input(s): PROBNP in the last 8760 hours. HbA1C: No results for input(s): HGBA1C in the last 72 hours. CBG: Recent Labs  Lab 11/12/17 0147 11/12/17 0304 11/12/17 1010 11/12/17 1254  GLUCAP 69 73 87 102*   Lipid Profile: No results for input(s): CHOL, HDL, LDLCALC, TRIG, CHOLHDL, LDLDIRECT in the last 72 hours. Thyroid Function Tests: No results for input(s): TSH, T4TOTAL, FREET4, T3FREE, THYROIDAB in the last 72 hours. Anemia Panel: No results for input(s): VITAMINB12, FOLATE, FERRITIN, TIBC, IRON,  RETICCTPCT in the last 72 hours. Urine analysis:    Component Value Date/Time   COLORURINE YELLOW 09/09/2016 [place] 09/09/2016 0554   LABSPEC 1.015 09/09/2016 0554   PHURINE 7.0 09/09/2016 0554   GLUCOSEU >=500 (A) 09/09/2016 0554   HGBUR NEGATIVE 09/09/2016 0554   BILIRUBINUR NEGATIVE 09/09/2016 0554   KETONESUR NEGATIVE 09/09/2016 0554   PROTEINUR >=300 (A) 09/09/2016 0554   UROBILINOGEN 0.2 02/03/2013 0038   NITRITE NEGATIVE 09/09/2016 0554   LEUKOCYTESUR NEGATIVE 09/09/2016 0554   Sepsis Labs: @LABRCNTIP (procalcitonin:4,lacticidven:4) )No results found for this or any previous visit (from the past 240 hour(s)).       Radiology Studies: Dg Abd Acute W/chest  Result Date: 11/12/2017 CLINICAL DATA:  Abdominal discomfort. End-stage renal disease. Hypertension. EXAM: DG ABDOMEN ACUTE W/ 1V CHEST COMPARISON:  Chest radiograph November 11, 2017; abdomen radiographs November 10, 2017 FINDINGS: PA chest: There is cardiomegaly with pulmonary venous hypertension. There is a small right pleural effusion. There is bibasilar atelectasis. There is no frank edema or consolidation. No adenopathy. Supine and upright abdomen: There is moderate stool in the colon. There is no bowel dilatation or air-fluid level to suggest bowel obstruction. No free air. Liver appears prominent. IMPRESSION: No bowel obstruction or free air. Liver appears prominent by radiography. There is pulmonary vascular congestion with small right pleural effusion and bibasilar atelectasis. No appreciable airspace consolidation. Electronically Signed   By: Lowella Grip III M.D.   On: 11/12/2017 07:58      Scheduled Meds: . calcium acetate  1,334 mg Oral TID WC  . [START ON 11/13/2017] cinacalcet  30 mg Oral Q T,Th,Sa-HD  . [START ON 11/18/2017] darbepoetin (ARANESP) injection - DIALYSIS  200 mcg Intravenous Q Thu-HD  . [START ON 11/13/2017] doxercalciferol  5 mcg Intravenous Q T,Th,Sa-HD  . heparin   5,000 Units Subcutaneous Q8H  . insulin lispro  0-9 Units Subcutaneous TID AC & HS  . multivitamin  1 tablet Oral QHS  . pantoprazole (PROTONIX) IV  40 mg Intravenous Q24H   Continuous Infusions: . sodium chloride       LOS: 0 days    Time spent in minutes: 35    Debbe Odea, MD [facility] Pager: [email or web address] Password [facility] 11/12/2017, 3:08 PM

## 2017-11-12 NOTE — Progress Notes (Signed)
This pt. was a referral from [facility]. I visited twice to this pt's room but she was not available...was in dialysis the whole day. I also called the [facility] before living and she was still not back. Chaplain will pass this patient to the next chaplain on-call. Calypso Hagarty a Medical sales representative, [employer]

## 2017-11-12 NOTE — Progress Notes (Signed)
Paged MD for Nephrology consult.  And if she is to get dialysis today then labs to be drawn during HD.   Patient is a hard IV stick she has blown 2 IV's that IV team started.  Patient may need a midline or picc line. Paged day MD for orders.

## 2017-11-12 NOTE — Progress Notes (Signed)
Critical lab resulted:  Hemoglobin of 6.1.  Nephrology notified.  Will continue to monitor.

## 2017-11-12 NOTE — Progress Notes (Signed)
Patient arrived to unit per bed.  Reviewed treatment plan and this RN agrees.  Report received from bedside RN, Anderson Malta.  Consent obtained.  Patient A & O X 4. Lung sounds diminished to ausculation in all fields. Generalized non pitting edema. Cardiac: NSR.  Prepped LUAVG with alcohol and cannulated with two 15 gauge needles.  Pulsation of blood noted.  Flushed access well with saline per protocol.  Connected and secured lines and initiated tx at 1138.  UF goal of 4500 mL and net fluid removal of 4000 mL.  Will continue to monitor.

## 2017-11-13 ENCOUNTER — Inpatient Hospital Stay (HOSPITAL_COMMUNITY): Payer: Medicare Other

## 2017-11-13 DIAGNOSIS — I255 Ischemic cardiomyopathy: Secondary | ICD-10-CM

## 2017-11-13 DIAGNOSIS — M79609 Pain in unspecified limb: Secondary | ICD-10-CM

## 2017-11-13 LAB — TYPE AND SCREEN
ABO/RH(D): O POS
Antibody Screen: POSITIVE
DAT, IgG: NEGATIVE
PT AG TYPE: NEGATIVE
UNIT DIVISION: 0
Unit division: 0

## 2017-11-13 LAB — BASIC METABOLIC PANEL
ANION GAP: 22 — AB (ref 5–15)
BUN: 16 mg/dL (ref 6–20)
CALCIUM: 8 mg/dL — AB (ref 8.9–10.3)
CO2: 24 mmol/L (ref 22–32)
CREATININE: 6.81 mg/dL — AB (ref 0.44–1.00)
Chloride: 89 mmol/L — ABNORMAL LOW (ref 101–111)
GFR calc non Af Amer: 7 mL/min — ABNORMAL LOW (ref >60)
GFR, EST AFRICAN AMERICAN: 8 mL/min — AB (ref >60)
Glucose, Bld: 142 mg/dL — ABNORMAL HIGH (ref 65–99)
Potassium: 3.5 mmol/L (ref 3.5–5.1)
SODIUM: 135 mmol/L (ref 135–145)

## 2017-11-13 LAB — CBC
HCT: 31.4 % — ABNORMAL LOW (ref 36.0–46.0)
HEMOGLOBIN: 9.7 g/dL — AB (ref 12.0–15.0)
MCH: 27.2 pg (ref 26.0–34.0)
MCHC: 30.9 g/dL (ref 30.0–36.0)
MCV: 88.2 fL (ref 78.0–100.0)
PLATELETS: 173 10*3/uL (ref 150–400)
RBC: 3.56 MIL/uL — AB (ref 3.87–5.11)
RDW: 18.8 % — ABNORMAL HIGH (ref 11.5–15.5)
WBC: 8.8 10*3/uL (ref 4.0–10.5)

## 2017-11-13 LAB — GLUCOSE, CAPILLARY
GLUCOSE-CAPILLARY: 177 mg/dL — AB (ref 65–99)
Glucose-Capillary: 96 mg/dL (ref 65–99)

## 2017-11-13 LAB — BPAM RBC
BLOOD PRODUCT EXPIRATION DATE: 201903222359
Blood Product Expiration Date: 201903172359
ISSUE DATE / TIME: 201902221546
ISSUE DATE / TIME: 201902222100
UNIT TYPE AND RH: 5100
Unit Type and Rh: 5100

## 2017-11-13 LAB — HEPATITIS B SURFACE ANTIBODY,QUALITATIVE: Hep B S Ab: REACTIVE

## 2017-11-13 LAB — HEPATITIS B CORE ANTIBODY, TOTAL: Hep B Core Total Ab: NEGATIVE

## 2017-11-13 MED ORDER — DOXERCALCIFEROL 4 MCG/2ML IV SOLN
INTRAVENOUS | Status: AC
  Administered 2017-11-13: 5 ug via INTRAVENOUS
  Filled 2017-11-13: qty 4

## 2017-11-13 MED ORDER — AMLODIPINE BESYLATE 5 MG PO TABS: 10.0000 mg | ORAL_TABLET | Freq: Every day | ORAL | 0 refills | Status: DC

## 2017-11-13 MED ORDER — ACETAMINOPHEN 325 MG PO TABS
ORAL_TABLET | ORAL | Status: AC
  Administered 2017-11-13: 650 mg via ORAL
  Filled 2017-11-13: qty 2

## 2017-11-13 MED ORDER — CALCITRIOL 0.5 MCG PO CAPS: 1.5000 ug | ORAL_CAPSULE | ORAL | 0 refills | Status: DC

## 2017-11-13 MED ORDER — CALCIUM ACETATE (PHOS BINDER) 667 MG PO CAPS: 1334.0000 mg | ORAL_CAPSULE | Freq: Three times a day (TID) | ORAL | 0 refills | Status: DC

## 2017-11-13 MED ORDER — PANTOPRAZOLE SODIUM 40 MG PO TBEC: 40.0000 mg | DELAYED_RELEASE_TABLET | Freq: Every day | ORAL | Status: DC

## 2017-11-13 MED ORDER — CINACALCET HCL 30 MG PO TABS: 30.0000 mg | ORAL_TABLET | ORAL | 0 refills | Status: DC

## 2017-11-13 MED ORDER — RENA-VITE PO TABS: 1.0000 | ORAL_TABLET | Freq: Every day | ORAL | 0 refills | Status: DC

## 2017-11-13 NOTE — Progress Notes (Signed)
SW called for transportation issues from home to HD; they will see the patient todayAneta [name] [phone]

## 2017-11-13 NOTE — Clinical Social Work Note (Signed)
Per MD pt will need transportation home tonight after HD. CSW spoke with Surveyor, quantity and safest option will be [facility]. RN to call [facility] [phone] option 1, option 3) and set up transport when pt has returned from HD. CSW will prepare the medical necessity form (under summary tab) for RN to print along with facesheet. Please write SSN on Medical necessity form.   [place], [place]

## 2017-11-13 NOTE — Consult Note (Addendum)
[facility] Renal Consultation Note  Indication for Consultation:  Management of ESRD/hemodialysis; anemia, hypertension/volume and secondary hyperparathyroidism  HPI: Karen [name] is a 42 y.o. female with ESRD on Chronic HD (TTS ,[facility] Unit) ho noncompliance with HD = only HD in Feb = 14th , 12th , Admitted with hyperkalemia and sob, ( pulmonary  vascular congestiion on CXR ) hgb 7.1 (last hgb 7.6 as op lab 11/04/17 Missing Op Mircera on hd with noncompliance . Other med problems=  hypertension, history of stroke( speech deficits  ), left lower extremity BKA (uses prosthesis ), diabetes mellitus type 2. She claims she moved to [place] without plans for [facility] and transportation issues has caused her problem .  As dw her outpt Kidney center charge RN = Challenging situation  For her and center in [place] as often EMS (nonemegent )or Sheriff provide transportation back and forth to her home .   Past Medical History:  Diagnosis Date  . Anemia   . Anxiety   . Dyspnea    "when I dont go to dialysis"  . Eczema   . ESRD (end stage renal disease) ([place])    [place] - TTHSAT- [place]  . Exertional shortness of breath    "recently; it's fluid" (02/02/2013)  . GERD (gastroesophageal reflux disease)   . Headache   . History of blood transfusion    "last week" (02/02/2013)  . History of kidney stones    passed  . Hypertension   . Ischemic cardiomyopathy    by echo 2014  . Neuropathy   . Neuropathy   . Osteomyelitis of toe of left foot ([place])    "off and on since 2009; no OR" (02/02/2013)  . Pneumonia 09/2016  . Type II diabetes mellitus ([place]) 1995    Past Surgical History:  Procedure Laterality Date  . AMPUTATION Left 02/06/2013   Procedure: AMPUTATION LEFT GREAT TOE;  Surgeon: Wylene Simmer, MD;  Location: [place];  Service: Orthopedics;  Laterality: Left;  . AMPUTATION Left 06/24/2013   Procedure: AMPUTATION BELOW KNEE ;  Surgeon: Wylene Simmer, MD;  Location: [place];  Service:  Orthopedics;  Laterality: Left;  . AV FISTULA PLACEMENT Right 02/08/2017   Procedure: CREATION OF RIGHT ARM  BASILIC VEIN TO BRACHIAL ARTERY ARTERIOVENOUS (AV) FISTULA;  Surgeon: Rosetta Posner, MD;  Location: [facility];  Service: Vascular;  Laterality: Right;  . AV FISTULA PLACEMENT Left 09/10/2017   Procedure: INSERTION OF ARTERIOVENOUS (AV) GORE-TEX GRAFT  LEFT UPPER ARM;  Surgeon: Angelia Mould, MD;  Location: [employer];  Service: Vascular;  Laterality: Left;  . BASCILIC VEIN TRANSPOSITION Right 04/14/2017   Procedure: BASCILIC VEIN TRANSPOSITION-RIGHT 2ND STAGE;  Surgeon: Rosetta Posner, MD;  Location: [place];  Service: Vascular;  Laterality: Right;  . CESAREAN SECTION  10/18/2006  . INSERTION OF DIALYSIS CATHETER        Family History  Problem Relation Age of Onset  . Hypertension Mother   . Diabetes Mother   . Diabetes Father       reports that she quit smoking about 13 years ago. Her smoking use included cigarettes. She has a 30.00 pack-year smoking history. she has never used smokeless tobacco. She reports that she does not drink alcohol or use drugs.   Allergies  Allergen Reactions  . Baclofen Itching  . Penicillins Anaphylaxis, Hives, Rash and Other (See Comments)    PATIENT HAD A PCN REACTION WITH IMMEDIATE RASH, FACIAL/TONGUE/THROAT SWELLING, SOB, OR LIGHTHEADEDNESS WITH HYPOTENSION:  #  #  #  YES  #  #  #   SEVERE RASH INVOLVING MUCUS MEMBRANES or SKIN NECROSIS: #  #  #  YES  #  #  # Has patient had a PCN reaction that required hospitalization No Has patient had a PCN reaction occurring within the last 10 years: No If all of the above answers are "NO", then may proceed with Cephalosporin use.  09/10/16- tolerated Cefepime   . Morphine And Related Hives and Rash  . Novolog [Insulin Aspart] Other (See Comments)    Cramps/ Gi distress    Prior to Admission medications   Medication Sig Start Date End Date Taking? Authorizing Provider  aspirin 325 MG tablet Take 1 tablet  (325 mg total) by mouth daily. 09/16/17  Yes Lacroce, Hulen Shouts, MD  atorvastatin (LIPITOR) 80 MG tablet TAKE 1 TABLET(80 MG) BY MOUTH DAILY AT 6 PM 09/16/17  Yes Lacroce, Hulen Shouts, MD  insulin glargine (LANTUS) 100 UNIT/ML injection Inject 0.1 mLs (10 Units total) into the skin at bedtime. 09/15/17  Yes Lacroce, Hulen Shouts, MD  oxyCODONE-acetaminophen (PERCOCET) 10-325 MG tablet Take 1 tablet by mouth 3 (three) times daily.   Yes [provider]  amLODipine (NORVASC) 5 MG tablet Take 2 tablets (10 mg total) by mouth daily. 11/13/17   Debbe Odea, MD  calcitRIOL (ROCALTROL) 0.5 MCG capsule Take 3 capsules (1.5 mcg total) by mouth Every Tuesday,Thursday,and Saturday with dialysis. 11/13/17   Debbe Odea, MD  calcium acetate (PHOSLO) 667 MG capsule Take 2 capsules (1,334 mg total) by mouth 3 (three) times daily with meals. 11/13/17   Debbe Odea, MD  cinacalcet (SENSIPAR) 30 MG tablet Take 1 tablet (30 mg total) by mouth Every Tuesday,Thursday,and Saturday with dialysis. 11/13/17   Debbe Odea, MD  Darbepoetin Alfa (ARANESP) 150 MCG/0.3ML SOSY injection Inject 0.3 mLs (150 mcg total) into the vein every Thursday with hemodialysis. Patient not taking: Reported on 11/13/2017 09/16/17   Melanee Spry, MD  insulin lispro (HUMALOG) 100 UNIT/ML injection Inject 2-5 Units into the skin 3 (three) times daily as needed for high blood sugar. Per sliding scale if blood sugar is over 150    [provider]  multivitamin (RENA-VIT) TABS tablet Take 1 tablet by mouth at bedtime. 11/13/17   Debbe Odea, MD    WER:[id number] **OR** acetaminophen, diphenhydrAMINE, hydrALAZINE, ondansetron **OR** ondansetron (ZOFRAN) IV  Results for orders placed or performed during the hospital encounter of 11/11/17 (from the past 48 hour(s))  Comprehensive metabolic panel     Status: Abnormal   Collection Time: 11/11/17 11:21 PM  Result Value Ref Range   Sodium 136 135 - 145 mmol/L   Potassium 6.4  (HH) 3.5 - 5.1 mmol/L    Comment: SLIGHT HEMOLYSIS CRITICAL RESULT CALLED TO, READ BACK BY AND VERIFIED WITH: K.LEE,RN [id number] 11/12/17 G.MCADOO    Chloride 99 (L) 101 - 111 mmol/L   CO2 21 (L) 22 - 32 mmol/L   Glucose, Bld 84 65 - 99 mg/dL   BUN 62 (H) 6 - 20 mg/dL   Creatinine, Ser 14.10 (H) 0.44 - 1.00 mg/dL   Calcium 8.5 (L) 8.9 - 10.3 mg/dL   Total Protein 7.3 6.5 - 8.1 g/dL   Albumin 3.2 (L) 3.5 - 5.0 g/dL   AST 35 15 - 41 U/L   ALT 15 14 - 54 U/L   Alkaline Phosphatase 71 38 - 126 U/L   Total Bilirubin 0.9 0.3 - 1.2 mg/dL   GFR calc non Af Amer 3 (L) >60  mL/min   GFR calc Af Amer 3 (L) >60 mL/min    Comment: (NOTE) The eGFR has been calculated using the CKD EPI equation. This calculation has not been validated in all clinical situations. eGFR's persistently <60 mL/min signify possible Chronic Kidney Disease.    Anion gap 16 (H) 5 - 15    Comment: Performed at [facility], [place] [address], [place], [place] [address]  CBC with Differential/Platelet     Status: Abnormal   Collection Time: 11/11/17 11:21 PM  Result Value Ref Range   WBC 5.6 4.0 - 10.5 K/uL   RBC 2.58 (L) 3.87 - 5.11 MIL/uL   Hemoglobin 7.1 (L) 12.0 - 15.0 g/dL   HCT 22.5 (L) 36.0 - 46.0 %   MCV 87.2 78.0 - 100.0 fL   MCH 27.5 26.0 - 34.0 pg   MCHC 31.6 30.0 - 36.0 g/dL   RDW 19.7 (H) 11.5 - 15.5 %   Platelets 176 150 - 400 K/uL   Neutrophils Relative % 65 %   Neutro Abs 3.6 1.7 - 7.7 K/uL   Lymphocytes Relative 27 %   Lymphs Abs 1.5 0.7 - 4.0 K/uL   Monocytes Relative 6 %   Monocytes Absolute 0.3 0.1 - 1.0 K/uL   Eosinophils Relative 2 %   Eosinophils Absolute 0.1 0.0 - 0.7 K/uL   Basophils Relative 0 %   Basophils Absolute 0.0 0.0 - 0.1 K/uL    Comment: Performed at [facility] [address], [place], [place] [address]  Lipase, blood     Status: None   Collection Time: 11/11/17 11:21 PM  Result Value Ref Range   Lipase 17 11 - 51 U/L    Comment: Performed at [facility] [address], [place], [place] [address]  Troponin I (q 6hr x 3)     Status: Abnormal   Collection Time: 11/11/17 11:21 PM  Result Value Ref Range   Troponin I 0.03 (HH) <0.03 ng/mL    Comment: CRITICAL RESULT CALLED TO, READ BACK BY AND VERIFIED WITH: K.LEE,RN 4008 11/12/17 G.MCADOO Performed at [facility], [place] [address] [place] [address], [place], [place] [address]   hCG, serum, qualitative     Status: None   Collection Time: 11/11/17 11:21 PM  Result Value Ref Range   Preg, Serum NEGATIVE NEGATIVE    Comment:        THE SENSITIVITY OF THIS METHODOLOGY IS >10 mIU/mL. Performed at [facility], [place] [address], [place], [place] [address]   Glucose, capillary     Status: None   Collection Time: 11/12/17  1:47 AM  Result Value Ref Range   Glucose-Capillary 69 65 - 99 mg/dL  Glucose, capillary     Status: None   Collection Time: 11/12/17  3:04 AM  Result Value Ref Range   Glucose-Capillary 73 65 - 99 mg/dL  Glucose, capillary     Status: None   Collection Time: 11/12/17 10:10 AM  Result Value Ref Range   Glucose-Capillary 87 65 - 99 mg/dL  Hepatitis B surface antibody     Status: None   Collection Time: 11/12/17 11:46 AM  Result Value Ref Range   Hep B S Ab Reactive     Comment: (NOTE)              Non Reactive: Inconsistent with immunity,  less than 10 mIU/mL              Reactive:     Consistent with immunity,                            greater than 9.9 mIU/mL Performed At: [facility] [place], [place] [id number] Rush Farmer MD [id number] Performed at [facility], [place] [address] [place], [place] [address]   Hepatitis B core antibody, total     Status: None   Collection Time: 11/12/17 11:46 AM  Result Value Ref Range   Hep B Core Total Ab Negative Negative    Comment: (NOTE) Performed At: [facility] [address] [place], [place] [id number] Rush Farmer MD [id number] Performed at [facility], [place] [address], [place], [place] [address]   Basic metabolic panel     Status: Abnormal   Collection Time: 11/12/17 11:47 AM  Result Value Ref Range   Sodium 139 135 - 145 mmol/L   Potassium 4.9 3.5 - 5.1 mmol/L   Chloride 101 101 - 111 mmol/L   CO2 20 (L) 22 - 32 mmol/L   Glucose, Bld 116 (H) 65 - 99 mg/dL   BUN 61 (H) 6 - 20 mg/dL   Creatinine, Ser 14.48 (H) 0.44 - 1.00 mg/dL   Calcium 8.3 (L) 8.9 - 10.3 mg/dL   GFR calc non Af Amer 3 (L) >60 mL/min   GFR calc Af Amer 3 (L) >60 mL/min    Comment: (NOTE) The eGFR has been calculated using the CKD EPI equation. This calculation has not been validated in all clinical situations. eGFR's persistently <60 mL/min signify possible Chronic Kidney Disease.    Anion gap 18 (H) 5 - 15    Comment: Performed at [facility], [place] [address], [place], [place] [address]  CBC     Status: Abnormal   Collection Time: 11/12/17 11:47 AM  Result Value Ref Range   WBC 5.6 4.0 - 10.5 K/uL   RBC 2.26 (L) 3.87 - 5.11 MIL/uL   Hemoglobin 6.1 (LL) 12.0 - 15.0 g/dL    Comment: REPEATED TO VERIFY CRITICAL RESULT CALLED TO, READ BACK BY AND VERIFIED WITH: M [facility] 1206 11/12/17 D BRADLEY    HCT 19.7 (L) 36.0 - 46.0 %   MCV 87.2 78.0 - 100.0 fL   MCH 27.0 26.0 - 34.0 pg   MCHC 31.0 30.0 - 36.0 g/dL   RDW 19.6 (H) 11.5 - 15.5 %   Platelets 155 150 - 400 K/uL    Comment: Performed at [facility], [place] [address], [place], [place] [address]  Troponin I     Status: Abnormal   Collection Time: 11/12/17 11:47 AM  Result Value Ref Range   Troponin I 0.03 (HH) <0.03 ng/mL    Comment: CRITICAL VALUE NOTED.  VALUE IS CONSISTENT WITH PREVIOUSLY REPORTED AND CALLED VALUE. Performed at [facility], [place] [address], [place],  [address]   Hepatitis B surface antigen     Status: None   Collection Time: 11/12/17 11:49 AM  Result Value Ref Range   Hepatitis B Surface Ag Negative Negative    Comment: (NOTE) A courtesy  copy of this report has been sent to [facility], [phone]. STAT RESULT FAXED TO [phone]  11/12/2017 1639 HRS STAT RESULT CALLED TO RIA Valda Lamb 11/12/2017 1723 HRS Performed At: [place] [address]  [place], [place] [id number] Rush Farmer MD [id number] Performed at [facility], [place] [address], [place], [place] [address]   Type and screen [place]     Status: None   Collection Time: 11/12/17 12:15 PM  Result Value Ref Range   ABO/RH(D) O POS    Antibody Screen POS    Sample Expiration 11/15/2017    DAT, IgG NEG    Antibody Identification ANTI LEA Bobby Rumpf a)    PT AG Type NEGATIVE FOR LEWIS A ANTIGEN    Unit Number [id number]    Blood Component Type RED CELLS,LR    Unit division 00    Status of Unit ISSUED,FINAL    Transfusion Status OK TO TRANSFUSE    Crossmatch Result COMPATIBLE    Unit Number [id number]    Blood Component Type RED CELLS,LR    Unit division 00    Status of Unit ISSUED,FINAL    Transfusion Status OK TO TRANSFUSE    Crossmatch Result COMPATIBLE   Prepare RBC     Status: None   Collection Time: 11/12/17 12:15 PM  Result Value Ref Range   Order Confirmation      ORDER PROCESSED BY BLOOD BANK Performed at [facility], [place] [address], [place], [place] [address]   Glucose, capillary     Status: Abnormal   Collection Time: 11/12/17 12:54 PM  Result Value Ref Range   Glucose-Capillary 102 (H) 65 - 99 mg/dL  Glucose, capillary     Status: Abnormal   Collection Time: 11/12/17  6:38 PM  Result Value Ref Range   Glucose-Capillary 130 (H) 65 - 99 mg/dL  Glucose, capillary     Status: Abnormal   Collection Time: 11/12/17 10:55 PM  Result Value Ref Range   Glucose-Capillary 129 (H) 65 - 99 mg/dL  Glucose, capillary     Status: Abnormal   Collection Time: 11/13/17  8:10 AM  Result Value Ref Range   Glucose-Capillary 177 (H) 65 - 99 mg/dL  Glucose, capillary     Status: None    Collection Time: 11/13/17 12:12 PM  Result Value Ref Range   Glucose-Capillary 96 65 - 99 mg/dL    ROS: see hpi  Physical Exam: Vitals:   11/13/17 1545 11/13/17 1600  BP: (!) 213/116 (!) 214/107  Pulse: 92 96  Resp:    Temp:    SpO2:       General: on HD  alert Obese  , AAF  On hd  NAD comfortable  HEENT: [place], EOMI, No icterus Neck: no jvd  Heart: RRR no m,r,g Lungs: CTA  non labored  breathing  On HD  Abdomen: Obese , soft nt, nd Extremities: Bipedal edema 1+ Skin: no overt rash/ dry scaly skin lower extrem bilat Neuro: Alert, some speech slurring (Not acute) Dialysis Access: LUA AVG patent on HD   Dialysis Orders: Center: ASH Unit TTS 4hr , 2k, 2.25ca  [place] , EDW @ 94 (left at 93.5 kg last hd) Uf profile 2, L UA AVG, Heparin 6.000 bolus, 2000 mid , Mircera 225 mcg q 2wks (last on 11/04/17 )    HD  med's also= Sensipar 24m  q hd                               Calcium acetate 667 mg 2 AC   Assessment/Plan 1. Volume overload = sec . Missed hd , hd yesterday  And today on schedule  2. Hyperkalemia -missed hd  3. ESRD -  HD tts 4. Hypertension/volume? HO CM    -vol ^ sec missed hd/ hd uf and meds  5. Anemia  - hgb 6,1  Getting transfused and esa next hd Tues  (received 11/04/17  As op ) hgb 9.7 today  6. Metabolic bone disease -  No vit d / phos binder= phoslo and sensipar  q hd  7. DM type 2 = per admit  8. Disposition =  Need sw input / today  Pt tells me plans to move back to [place]= "Live in [place] until find new place to live in [place]"    Ernest Haber, PA-C [place] [phone] 11/13/2017, 4:27 PM   Pt seen, examined and agree w A/P as above. ESRD pt w/ hx CVA not really taking good care of herself.  Says she moved to outside of [place] but didn't arrange for rides to HD.  She missed a number of HD sessions as a result.  She is planning to move back to [place] and get transportation.  OK for dc after HD today.  Kelly Splinter MD [employer] pager [phone]   11/13/2017, 5:56 PM

## 2017-11-13 NOTE — Progress Notes (Signed)
Patient being discharged home. PTAR called to set up transportation home. Discharge instructions given to patient. Patient educated on medications and making follow up appts. Patient verbalized understanding.

## 2017-11-13 NOTE — Clinical Social Work Note (Signed)
CSW met with pt at bedside. Pt resides in [place]. CSW provided pt with [facility] information. Unfortunately we are unable to make apts with the transport on weekends. Pt has dialysis on Tuesday. CSW provided the number and explained to pt that it is important for her to call Monday to set it up transport to and from dialysis onTuesday. Pt verbalized understanding. Clinical Social Worker will sign off for now as social work intervention is no longer needed. Please consult [place] again if new need arises.   Shelton Silvas A Stefany Starace 11/13/2017

## 2017-11-13 NOTE — Progress Notes (Signed)
Right lower extremity ABI and TBI attempted. ABI and TBI could not be ascertained due calcification. Doppler waveforms were monophasic. [facility], [place] 11/13/2017. RVS  11/13/2017, 4:19 PM

## 2017-11-13 NOTE — Procedures (Signed)
   I was present at this dialysis session, have reviewed the session itself and made  appropriate changes Kelly Splinter MD [place] pager [phone]   11/13/2017, 3:30 PM

## 2017-11-13 NOTE — Progress Notes (Signed)
Sister's address after talking to her is [place]. [place], [place] [address] or [phone].

## 2017-11-13 NOTE — Progress Notes (Addendum)
[facility] here to transport patient home. Apparently address is not in [facility] unable to transport. Several calls made by patient to be discharged home to with no response.

## 2017-11-14 DIAGNOSIS — M25571 Pain in right ankle and joints of right foot: Secondary | ICD-10-CM | POA: Diagnosis not present

## 2017-11-14 DIAGNOSIS — S99911A Unspecified injury of right ankle, initial encounter: Secondary | ICD-10-CM | POA: Diagnosis not present

## 2017-11-14 DIAGNOSIS — S90929A Unspecified superficial injury of unspecified foot, initial encounter: Secondary | ICD-10-CM | POA: Diagnosis not present

## 2017-11-14 DIAGNOSIS — S99921A Unspecified injury of right foot, initial encounter: Secondary | ICD-10-CM | POA: Diagnosis not present

## 2017-11-14 DIAGNOSIS — M79671 Pain in right foot: Secondary | ICD-10-CM | POA: Diagnosis not present

## 2017-11-14 DIAGNOSIS — G8911 Acute pain due to trauma: Secondary | ICD-10-CM | POA: Diagnosis not present

## 2017-11-15 DIAGNOSIS — Z9119 Patient's noncompliance with other medical treatment and regimen: Secondary | ICD-10-CM | POA: Diagnosis not present

## 2017-11-15 DIAGNOSIS — Z794 Long term (current) use of insulin: Secondary | ICD-10-CM | POA: Diagnosis not present

## 2017-11-15 DIAGNOSIS — I10 Essential (primary) hypertension: Secondary | ICD-10-CM | POA: Diagnosis not present

## 2017-11-15 DIAGNOSIS — E1165 Type 2 diabetes mellitus with hyperglycemia: Secondary | ICD-10-CM | POA: Diagnosis not present

## 2017-11-15 DIAGNOSIS — Z992 Dependence on renal dialysis: Secondary | ICD-10-CM | POA: Diagnosis not present

## 2017-11-15 DIAGNOSIS — G8929 Other chronic pain: Secondary | ICD-10-CM | POA: Diagnosis not present

## 2017-11-17 DIAGNOSIS — I69322 Dysarthria following cerebral infarction: Secondary | ICD-10-CM | POA: Diagnosis not present

## 2017-11-17 DIAGNOSIS — K5900 Constipation, unspecified: Secondary | ICD-10-CM | POA: Diagnosis not present

## 2017-11-17 DIAGNOSIS — Z794 Long term (current) use of insulin: Secondary | ICD-10-CM | POA: Diagnosis not present

## 2017-11-17 DIAGNOSIS — Z79899 Other long term (current) drug therapy: Secondary | ICD-10-CM | POA: Diagnosis not present

## 2017-11-17 DIAGNOSIS — Z7982 Long term (current) use of aspirin: Secondary | ICD-10-CM | POA: Diagnosis not present

## 2017-11-17 DIAGNOSIS — E1122 Type 2 diabetes mellitus with diabetic chronic kidney disease: Secondary | ICD-10-CM | POA: Diagnosis not present

## 2017-11-17 DIAGNOSIS — I120 Hypertensive chronic kidney disease with stage 5 chronic kidney disease or end stage renal disease: Secondary | ICD-10-CM | POA: Diagnosis not present

## 2017-11-17 DIAGNOSIS — Z992 Dependence on renal dialysis: Secondary | ICD-10-CM | POA: Diagnosis not present

## 2017-11-17 DIAGNOSIS — N186 End stage renal disease: Secondary | ICD-10-CM | POA: Diagnosis not present

## 2017-11-18 DIAGNOSIS — D631 Anemia in chronic kidney disease: Secondary | ICD-10-CM | POA: Diagnosis not present

## 2017-11-18 DIAGNOSIS — I120 Hypertensive chronic kidney disease with stage 5 chronic kidney disease or end stage renal disease: Secondary | ICD-10-CM | POA: Diagnosis not present

## 2017-11-18 DIAGNOSIS — Z7982 Long term (current) use of aspirin: Secondary | ICD-10-CM | POA: Diagnosis not present

## 2017-11-18 DIAGNOSIS — Z89512 Acquired absence of left leg below knee: Secondary | ICD-10-CM | POA: Diagnosis not present

## 2017-11-18 DIAGNOSIS — N186 End stage renal disease: Secondary | ICD-10-CM | POA: Diagnosis not present

## 2017-11-18 DIAGNOSIS — Z8673 Personal history of transient ischemic attack (TIA), and cerebral infarction without residual deficits: Secondary | ICD-10-CM | POA: Diagnosis not present

## 2017-11-18 DIAGNOSIS — Z992 Dependence on renal dialysis: Secondary | ICD-10-CM | POA: Diagnosis not present

## 2017-11-18 DIAGNOSIS — Z79899 Other long term (current) drug therapy: Secondary | ICD-10-CM | POA: Diagnosis not present

## 2017-11-18 DIAGNOSIS — Z89511 Acquired absence of right leg below knee: Secondary | ICD-10-CM | POA: Diagnosis not present

## 2017-11-18 DIAGNOSIS — Z794 Long term (current) use of insulin: Secondary | ICD-10-CM | POA: Diagnosis not present

## 2017-11-18 DIAGNOSIS — E1122 Type 2 diabetes mellitus with diabetic chronic kidney disease: Secondary | ICD-10-CM | POA: Diagnosis not present

## 2017-11-23 DIAGNOSIS — A4102 Sepsis due to Methicillin resistant Staphylococcus aureus: Secondary | ICD-10-CM | POA: Diagnosis not present

## 2017-11-23 DIAGNOSIS — E1129 Type 2 diabetes mellitus with other diabetic kidney complication: Secondary | ICD-10-CM | POA: Diagnosis not present

## 2017-11-23 DIAGNOSIS — D631 Anemia in chronic kidney disease: Secondary | ICD-10-CM | POA: Diagnosis not present

## 2017-11-23 DIAGNOSIS — N186 End stage renal disease: Secondary | ICD-10-CM | POA: Diagnosis not present

## 2017-11-23 DIAGNOSIS — R509 Fever, unspecified: Secondary | ICD-10-CM | POA: Diagnosis not present

## 2017-11-23 DIAGNOSIS — N2581 Secondary hyperparathyroidism of renal origin: Secondary | ICD-10-CM | POA: Diagnosis not present

## 2017-11-26 NOTE — Discharge Summary (Signed)
Physician Discharge Summary  REDITH DRACH JIR:[id number] DOB: 1975-12-29 DOA: 11/11/2017  PCP: System, Pcp Not In  Admit date: 11/11/2017 Discharge date: 11/26/2017  Admitted From: home  Disposition:  home      Discharge Condition:  stable   CODE STATUS:  Full code   Consultations:  nephrology    Discharge Diagnoses:  Principal Problem:   Fluid overload, unspecified Active Problems:   PAD (peripheral artery disease) ([place])   Cardiomyopathy, ischemic - EF 45-50% with inf WMA by 2D 02/05/13   S/P BKA (below knee amputation) unilateral ([place])   End-stage renal disease on hemodialysis ([place])   Anemia in other chronic diseases classified elsewhere     Brief Summary: Karen [name] is a 42 y.o.femalewithhistory of ESRD on hemodialysis, hypertension, history of stroke, left lower extremity BKA, diabetes mellitus, anemia had presented to the ER at [facility] [place] yesterday with multiple complaints including shortness of breath abdominal discomfort and lower extremity pain. Patient has not gone to dialysis for 3 weeks since he had transport issues. She was transferred from [place] to [facility] to undergo dialysis    Hospital Course:  Principal Problem:   Fluid overload, unspecified/  End-stage renal disease on hemodialysis   Cardiomyopathy, ischemic - EF 45-50% with inf WMA by 2D 02/05/13 - has been adequately dialyzed and is stable for discharge- social work has assisted her with her transportation issues and hopefully this will not happen again  Active Problems:   PAD (peripheral artery disease)/ S/P BKA (below knee amputation) unilateral    DM2 - cont Lantus and Humalog     Anemia in chronic diseases  - Hb noted to be 6.1- nephrology has transfused 1 u PRBC bringing it up to 9.7.    Hypertension  - cont Norvasc    Discharge Exam: Vitals:   11/13/17 1849 11/13/17 2014  BP: 140/87 (!) 145/79  Pulse: 90 (!) 105  Resp: 20 20  Temp: 98.2 F (36.8 C) 98.3 F (36.8  C)  SpO2: 97% 97%   Vitals:   11/13/17 1800 11/13/17 1830 11/13/17 1849 11/13/17 2014  BP: 121/70 (!) 152/82 140/87 (!) 145/79  Pulse: 81 83 90 (!) 105  Resp:   20 20  Temp:   98.2 F (36.8 C) 98.3 F (36.8 C)  TempSrc:   Oral Oral  SpO2:   97% 97%  Weight:   90.6 kg (199 lb 11.8 oz)   Height:        General: Pt is alert, awake, not in acute distress Cardiovascular: RRR, S1/S2 +, no rubs, no gallops Respiratory: CTA bilaterally, no wheezing, no rhonchi Abdominal: Soft, NT, ND, bowel sounds + Extremities: no edema, no cyanosis   Discharge Instructions  Discharge Instructions    Diet general   Complete by:  As directed    Renal heart healthy diabetic diet   Increase activity slowly   Complete by:  As directed      Allergies as of 11/13/2017      Reactions   Baclofen Itching   Penicillins Anaphylaxis, Hives, Rash, Other (See Comments)   PATIENT HAD A PCN REACTION WITH IMMEDIATE RASH, FACIAL/TONGUE/THROAT SWELLING, SOB, OR LIGHTHEADEDNESS WITH HYPOTENSION:  #  #  #  YES  #  #  #   SEVERE RASH INVOLVING MUCUS MEMBRANES or SKIN NECROSIS: #  #  #  YES  #  #  # Has patient had a PCN reaction that required hospitalization No Has patient had a PCN reaction occurring within  the last 10 years: No If all of the above answers are "NO", then may proceed with Cephalosporin use. 09/10/16- tolerated Cefepime   Morphine And Related Hives, Rash   Novolog [insulin Aspart] Other (See Comments)   Cramps/ Gi distress      Medication List    TAKE these medications   amLODipine 5 MG tablet Commonly known as:  NORVASC Take 2 tablets (10 mg total) by mouth daily.   aspirin 325 MG tablet Take 1 tablet (325 mg total) by mouth daily.   atorvastatin 80 MG tablet Commonly known as:  LIPITOR TAKE 1 TABLET(80 MG) BY MOUTH DAILY AT 6 PM   calcitRIOL 0.5 MCG capsule Commonly known as:  ROCALTROL Take 3 capsules (1.5 mcg total) by mouth Every Tuesday,Thursday,and Saturday with dialysis.    calcium acetate 667 MG capsule Commonly known as:  PHOSLO Take 2 capsules (1,334 mg total) by mouth 3 (three) times daily with meals.   cinacalcet 30 MG tablet Commonly known as:  SENSIPAR Take 1 tablet (30 mg total) by mouth Every Tuesday,Thursday,and Saturday with dialysis.   Darbepoetin Alfa 150 MCG/0.3ML Sosy injection Commonly known as:  ARANESP Inject 0.3 mLs (150 mcg total) into the vein every Thursday with hemodialysis.   insulin glargine 100 UNIT/ML injection Commonly known as:  LANTUS Inject 0.1 mLs (10 Units total) into the skin at bedtime.   insulin lispro 100 UNIT/ML injection Commonly known as:  HUMALOG Inject 2-5 Units into the skin 3 (three) times daily as needed for high blood sugar. Per sliding scale if blood sugar is over 150   multivitamin Tabs tablet Take 1 tablet by mouth at bedtime.   oxyCODONE-acetaminophen 10-325 MG tablet Commonly known as:  PERCOCET Take 1 tablet by mouth 3 (three) times daily.       Allergies  Allergen Reactions  . Baclofen Itching  . Penicillins Anaphylaxis, Hives, Rash and Other (See Comments)    PATIENT HAD A PCN REACTION WITH IMMEDIATE RASH, FACIAL/TONGUE/THROAT SWELLING, SOB, OR LIGHTHEADEDNESS WITH HYPOTENSION:  #  #  #  YES  #  #  #   SEVERE RASH INVOLVING MUCUS MEMBRANES or SKIN NECROSIS: #  #  #  YES  #  #  # Has patient had a PCN reaction that required hospitalization No Has patient had a PCN reaction occurring within the last 10 years: No If all of the above answers are "NO", then may proceed with Cephalosporin use.  09/10/16- tolerated Cefepime   . Morphine And Related Hives and Rash  . Novolog [Insulin Aspart] Other (See Comments)    Cramps/ Gi distress     Procedures/Studies:    Dg Abd Acute W/chest  Result Date: 11/12/2017 CLINICAL DATA:  Abdominal discomfort. End-stage renal disease. Hypertension. EXAM: DG ABDOMEN ACUTE W/ 1V CHEST COMPARISON:  Chest radiograph November 11, 2017; abdomen radiographs  November 10, 2017 FINDINGS: PA chest: There is cardiomegaly with pulmonary venous hypertension. There is a small right pleural effusion. There is bibasilar atelectasis. There is no frank edema or consolidation. No adenopathy. Supine and upright abdomen: There is moderate stool in the colon. There is no bowel dilatation or air-fluid level to suggest bowel obstruction. No free air. Liver appears prominent. IMPRESSION: No bowel obstruction or free air. Liver appears prominent by radiography. There is pulmonary vascular congestion with small right pleural effusion and bibasilar atelectasis. No appreciable airspace consolidation. Electronically Signed   By: Lowella Grip III M.D.   On: 11/12/2017 07:58  The results of significant diagnostics from this hospitalization (including imaging, microbiology, ancillary and laboratory) are listed below for reference.     Microbiology: No results found for this or any previous visit (from the past 240 hour(s)).   Labs: BNP (last 3 results) No results for input(s): BNP in the last 8760 hours. Basic Metabolic Panel: No results for input(s): NA, K, CL, CO2, GLUCOSE, BUN, CREATININE, CALCIUM, MG, PHOS in the last 168 hours. Liver Function Tests: No results for input(s): AST, ALT, ALKPHOS, BILITOT, PROT, ALBUMIN in the last 168 hours. No results for input(s): LIPASE, AMYLASE in the last 168 hours. No results for input(s): AMMONIA in the last 168 hours. CBC: No results for input(s): WBC, NEUTROABS, HGB, HCT, MCV, PLT in the last 168 hours. Cardiac Enzymes: No results for input(s): CKTOTAL, CKMB, CKMBINDEX, TROPONINI in the last 168 hours. BNP: Invalid input(s): POCBNP CBG: No results for input(s): GLUCAP in the last 168 hours. D-Dimer No results for input(s): DDIMER in the last 72 hours. Hgb A1c No results for input(s): HGBA1C in the last 72 hours. Lipid Profile No results for input(s): CHOL, HDL, LDLCALC, TRIG, CHOLHDL, LDLDIRECT in the last 72  hours. Thyroid function studies No results for input(s): TSH, T4TOTAL, T3FREE, THYROIDAB in the last 72 hours.  Invalid input(s): FREET3 Anemia work up No results for input(s): VITAMINB12, FOLATE, FERRITIN, TIBC, IRON, RETICCTPCT in the last 72 hours. Urinalysis    Component Value Date/Time   COLORURINE YELLOW 09/09/2016 [place] 09/09/2016 0554   LABSPEC 1.015 09/09/2016 0554   PHURINE 7.0 09/09/2016 0554   GLUCOSEU >=500 (A) 09/09/2016 0554   HGBUR NEGATIVE 09/09/2016 0554   BILIRUBINUR NEGATIVE 09/09/2016 0554   KETONESUR NEGATIVE 09/09/2016 0554   PROTEINUR >=300 (A) 09/09/2016 0554   UROBILINOGEN 0.2 02/03/2013 0038   NITRITE NEGATIVE 09/09/2016 0554   LEUKOCYTESUR NEGATIVE 09/09/2016 0554   Sepsis Labs Invalid input(s): PROCALCITONIN,  WBC,  LACTICIDVEN Microbiology No results found for this or any previous visit (from the past 240 hour(s)).   Time coordinating discharge: Over 30 minutes  SIGNED:   Debbe Odea, MD  [facility] 11/26/2017, 5:33 PM Pager   If 7PM-7AM, please contact night-coverage [email or web address] Password [email or web address]

## 2017-11-27 DIAGNOSIS — N186 End stage renal disease: Secondary | ICD-10-CM | POA: Diagnosis not present

## 2017-11-27 DIAGNOSIS — E1129 Type 2 diabetes mellitus with other diabetic kidney complication: Secondary | ICD-10-CM | POA: Diagnosis not present

## 2017-11-27 DIAGNOSIS — D631 Anemia in chronic kidney disease: Secondary | ICD-10-CM | POA: Diagnosis not present

## 2017-11-27 DIAGNOSIS — N2581 Secondary hyperparathyroidism of renal origin: Secondary | ICD-10-CM | POA: Diagnosis not present

## 2017-11-27 DIAGNOSIS — R509 Fever, unspecified: Secondary | ICD-10-CM | POA: Diagnosis not present

## 2017-11-27 DIAGNOSIS — A4102 Sepsis due to Methicillin resistant Staphylococcus aureus: Secondary | ICD-10-CM | POA: Diagnosis not present

## 2017-11-30 DIAGNOSIS — N186 End stage renal disease: Secondary | ICD-10-CM | POA: Diagnosis not present

## 2017-11-30 DIAGNOSIS — R509 Fever, unspecified: Secondary | ICD-10-CM | POA: Diagnosis not present

## 2017-11-30 DIAGNOSIS — N2581 Secondary hyperparathyroidism of renal origin: Secondary | ICD-10-CM | POA: Diagnosis not present

## 2017-11-30 DIAGNOSIS — E1129 Type 2 diabetes mellitus with other diabetic kidney complication: Secondary | ICD-10-CM | POA: Diagnosis not present

## 2017-11-30 DIAGNOSIS — A4102 Sepsis due to Methicillin resistant Staphylococcus aureus: Secondary | ICD-10-CM | POA: Diagnosis not present

## 2017-11-30 DIAGNOSIS — D631 Anemia in chronic kidney disease: Secondary | ICD-10-CM | POA: Diagnosis not present

## 2017-12-02 DIAGNOSIS — N186 End stage renal disease: Secondary | ICD-10-CM | POA: Diagnosis not present

## 2017-12-02 DIAGNOSIS — R509 Fever, unspecified: Secondary | ICD-10-CM | POA: Diagnosis not present

## 2017-12-02 DIAGNOSIS — N2581 Secondary hyperparathyroidism of renal origin: Secondary | ICD-10-CM | POA: Diagnosis not present

## 2017-12-02 DIAGNOSIS — A4102 Sepsis due to Methicillin resistant Staphylococcus aureus: Secondary | ICD-10-CM | POA: Diagnosis not present

## 2017-12-02 DIAGNOSIS — E1129 Type 2 diabetes mellitus with other diabetic kidney complication: Secondary | ICD-10-CM | POA: Diagnosis not present

## 2017-12-02 DIAGNOSIS — D631 Anemia in chronic kidney disease: Secondary | ICD-10-CM | POA: Diagnosis not present

## 2017-12-07 DIAGNOSIS — E1129 Type 2 diabetes mellitus with other diabetic kidney complication: Secondary | ICD-10-CM | POA: Diagnosis not present

## 2017-12-07 DIAGNOSIS — N186 End stage renal disease: Secondary | ICD-10-CM | POA: Diagnosis not present

## 2017-12-07 DIAGNOSIS — A4102 Sepsis due to Methicillin resistant Staphylococcus aureus: Secondary | ICD-10-CM | POA: Diagnosis not present

## 2017-12-07 DIAGNOSIS — R509 Fever, unspecified: Secondary | ICD-10-CM | POA: Diagnosis not present

## 2017-12-07 DIAGNOSIS — N2581 Secondary hyperparathyroidism of renal origin: Secondary | ICD-10-CM | POA: Diagnosis not present

## 2017-12-07 DIAGNOSIS — D631 Anemia in chronic kidney disease: Secondary | ICD-10-CM | POA: Diagnosis not present

## 2017-12-09 DIAGNOSIS — N2581 Secondary hyperparathyroidism of renal origin: Secondary | ICD-10-CM | POA: Diagnosis not present

## 2017-12-09 DIAGNOSIS — A4102 Sepsis due to Methicillin resistant Staphylococcus aureus: Secondary | ICD-10-CM | POA: Diagnosis not present

## 2017-12-09 DIAGNOSIS — E1129 Type 2 diabetes mellitus with other diabetic kidney complication: Secondary | ICD-10-CM | POA: Diagnosis not present

## 2017-12-09 DIAGNOSIS — R509 Fever, unspecified: Secondary | ICD-10-CM | POA: Diagnosis not present

## 2017-12-09 DIAGNOSIS — N186 End stage renal disease: Secondary | ICD-10-CM | POA: Diagnosis not present

## 2017-12-09 DIAGNOSIS — D631 Anemia in chronic kidney disease: Secondary | ICD-10-CM | POA: Diagnosis not present

## 2017-12-10 DIAGNOSIS — R404 Transient alteration of awareness: Secondary | ICD-10-CM | POA: Diagnosis not present

## 2017-12-10 DIAGNOSIS — Z992 Dependence on renal dialysis: Secondary | ICD-10-CM | POA: Diagnosis not present

## 2017-12-10 DIAGNOSIS — I120 Hypertensive chronic kidney disease with stage 5 chronic kidney disease or end stage renal disease: Secondary | ICD-10-CM | POA: Diagnosis not present

## 2017-12-10 DIAGNOSIS — E1122 Type 2 diabetes mellitus with diabetic chronic kidney disease: Secondary | ICD-10-CM | POA: Diagnosis not present

## 2017-12-10 DIAGNOSIS — J209 Acute bronchitis, unspecified: Secondary | ICD-10-CM | POA: Diagnosis not present

## 2017-12-10 DIAGNOSIS — N186 End stage renal disease: Secondary | ICD-10-CM | POA: Diagnosis not present

## 2017-12-10 DIAGNOSIS — R531 Weakness: Secondary | ICD-10-CM | POA: Diagnosis not present

## 2017-12-10 DIAGNOSIS — R05 Cough: Secondary | ICD-10-CM | POA: Diagnosis not present

## 2017-12-10 DIAGNOSIS — X31XXXA Exposure to excessive natural cold, initial encounter: Secondary | ICD-10-CM | POA: Diagnosis not present

## 2017-12-11 DIAGNOSIS — N186 End stage renal disease: Secondary | ICD-10-CM | POA: Diagnosis not present

## 2017-12-11 DIAGNOSIS — E1129 Type 2 diabetes mellitus with other diabetic kidney complication: Secondary | ICD-10-CM | POA: Diagnosis not present

## 2017-12-11 DIAGNOSIS — D631 Anemia in chronic kidney disease: Secondary | ICD-10-CM | POA: Diagnosis not present

## 2017-12-11 DIAGNOSIS — A4102 Sepsis due to Methicillin resistant Staphylococcus aureus: Secondary | ICD-10-CM | POA: Diagnosis not present

## 2017-12-11 DIAGNOSIS — R509 Fever, unspecified: Secondary | ICD-10-CM | POA: Diagnosis not present

## 2017-12-11 DIAGNOSIS — N2581 Secondary hyperparathyroidism of renal origin: Secondary | ICD-10-CM | POA: Diagnosis not present

## 2017-12-16 DIAGNOSIS — I5040 Unspecified combined systolic (congestive) and diastolic (congestive) heart failure: Secondary | ICD-10-CM | POA: Diagnosis not present

## 2017-12-16 DIAGNOSIS — E1129 Type 2 diabetes mellitus with other diabetic kidney complication: Secondary | ICD-10-CM | POA: Diagnosis not present

## 2017-12-16 DIAGNOSIS — D631 Anemia in chronic kidney disease: Secondary | ICD-10-CM | POA: Diagnosis not present

## 2017-12-16 DIAGNOSIS — N186 End stage renal disease: Secondary | ICD-10-CM | POA: Diagnosis not present

## 2017-12-16 DIAGNOSIS — R404 Transient alteration of awareness: Secondary | ICD-10-CM | POA: Diagnosis not present

## 2017-12-16 DIAGNOSIS — M545 Low back pain: Secondary | ICD-10-CM | POA: Diagnosis not present

## 2017-12-16 DIAGNOSIS — N179 Acute kidney failure, unspecified: Secondary | ICD-10-CM | POA: Diagnosis not present

## 2017-12-16 DIAGNOSIS — Z992 Dependence on renal dialysis: Secondary | ICD-10-CM | POA: Diagnosis not present

## 2017-12-16 DIAGNOSIS — N2581 Secondary hyperparathyroidism of renal origin: Secondary | ICD-10-CM | POA: Diagnosis not present

## 2017-12-16 DIAGNOSIS — R509 Fever, unspecified: Secondary | ICD-10-CM | POA: Diagnosis not present

## 2017-12-16 DIAGNOSIS — R531 Weakness: Secondary | ICD-10-CM | POA: Diagnosis not present

## 2017-12-16 DIAGNOSIS — Z9119 Patient's noncompliance with other medical treatment and regimen: Secondary | ICD-10-CM | POA: Diagnosis not present

## 2017-12-16 DIAGNOSIS — Z9114 Patient's other noncompliance with medication regimen: Secondary | ICD-10-CM | POA: Diagnosis not present

## 2017-12-16 DIAGNOSIS — A4102 Sepsis due to Methicillin resistant Staphylococcus aureus: Secondary | ICD-10-CM | POA: Diagnosis not present

## 2017-12-20 DIAGNOSIS — Z992 Dependence on renal dialysis: Secondary | ICD-10-CM | POA: Diagnosis not present

## 2017-12-20 DIAGNOSIS — E1122 Type 2 diabetes mellitus with diabetic chronic kidney disease: Secondary | ICD-10-CM | POA: Diagnosis not present

## 2017-12-20 DIAGNOSIS — N186 End stage renal disease: Secondary | ICD-10-CM | POA: Diagnosis not present

## 2017-12-21 DIAGNOSIS — E1129 Type 2 diabetes mellitus with other diabetic kidney complication: Secondary | ICD-10-CM | POA: Diagnosis not present

## 2017-12-21 DIAGNOSIS — N2581 Secondary hyperparathyroidism of renal origin: Secondary | ICD-10-CM | POA: Diagnosis not present

## 2017-12-21 DIAGNOSIS — A4102 Sepsis due to Methicillin resistant Staphylococcus aureus: Secondary | ICD-10-CM | POA: Diagnosis not present

## 2017-12-21 DIAGNOSIS — N186 End stage renal disease: Secondary | ICD-10-CM | POA: Diagnosis not present

## 2017-12-21 DIAGNOSIS — D509 Iron deficiency anemia, unspecified: Secondary | ICD-10-CM | POA: Diagnosis not present

## 2017-12-21 DIAGNOSIS — D631 Anemia in chronic kidney disease: Secondary | ICD-10-CM | POA: Diagnosis not present

## 2017-12-23 DIAGNOSIS — E1129 Type 2 diabetes mellitus with other diabetic kidney complication: Secondary | ICD-10-CM | POA: Diagnosis not present

## 2017-12-23 DIAGNOSIS — N2581 Secondary hyperparathyroidism of renal origin: Secondary | ICD-10-CM | POA: Diagnosis not present

## 2017-12-23 DIAGNOSIS — D631 Anemia in chronic kidney disease: Secondary | ICD-10-CM | POA: Diagnosis not present

## 2017-12-23 DIAGNOSIS — D509 Iron deficiency anemia, unspecified: Secondary | ICD-10-CM | POA: Diagnosis not present

## 2017-12-23 DIAGNOSIS — N186 End stage renal disease: Secondary | ICD-10-CM | POA: Diagnosis not present

## 2017-12-23 DIAGNOSIS — A4102 Sepsis due to Methicillin resistant Staphylococcus aureus: Secondary | ICD-10-CM | POA: Diagnosis not present

## 2017-12-25 DIAGNOSIS — N2581 Secondary hyperparathyroidism of renal origin: Secondary | ICD-10-CM | POA: Diagnosis not present

## 2017-12-25 DIAGNOSIS — D509 Iron deficiency anemia, unspecified: Secondary | ICD-10-CM | POA: Diagnosis not present

## 2017-12-25 DIAGNOSIS — E1129 Type 2 diabetes mellitus with other diabetic kidney complication: Secondary | ICD-10-CM | POA: Diagnosis not present

## 2017-12-25 DIAGNOSIS — D631 Anemia in chronic kidney disease: Secondary | ICD-10-CM | POA: Diagnosis not present

## 2017-12-25 DIAGNOSIS — N186 End stage renal disease: Secondary | ICD-10-CM | POA: Diagnosis not present

## 2017-12-25 DIAGNOSIS — A4102 Sepsis due to Methicillin resistant Staphylococcus aureus: Secondary | ICD-10-CM | POA: Diagnosis not present

## 2017-12-27 DIAGNOSIS — E7800 Pure hypercholesterolemia, unspecified: Secondary | ICD-10-CM | POA: Diagnosis not present

## 2017-12-27 DIAGNOSIS — I120 Hypertensive chronic kidney disease with stage 5 chronic kidney disease or end stage renal disease: Secondary | ICD-10-CM | POA: Diagnosis not present

## 2017-12-27 DIAGNOSIS — F5089 Other specified eating disorder: Secondary | ICD-10-CM | POA: Diagnosis not present

## 2017-12-27 DIAGNOSIS — Z79891 Long term (current) use of opiate analgesic: Secondary | ICD-10-CM | POA: Diagnosis not present

## 2017-12-27 DIAGNOSIS — K5900 Constipation, unspecified: Secondary | ICD-10-CM | POA: Diagnosis not present

## 2017-12-27 DIAGNOSIS — Z79899 Other long term (current) drug therapy: Secondary | ICD-10-CM | POA: Diagnosis not present

## 2017-12-27 DIAGNOSIS — F983 Pica of infancy and childhood: Secondary | ICD-10-CM | POA: Diagnosis not present

## 2017-12-27 DIAGNOSIS — Z8673 Personal history of transient ischemic attack (TIA), and cerebral infarction without residual deficits: Secondary | ICD-10-CM | POA: Diagnosis not present

## 2017-12-27 DIAGNOSIS — E1122 Type 2 diabetes mellitus with diabetic chronic kidney disease: Secondary | ICD-10-CM | POA: Diagnosis not present

## 2017-12-27 DIAGNOSIS — Z794 Long term (current) use of insulin: Secondary | ICD-10-CM | POA: Diagnosis not present

## 2017-12-27 DIAGNOSIS — N186 End stage renal disease: Secondary | ICD-10-CM | POA: Diagnosis not present

## 2017-12-28 DIAGNOSIS — A4102 Sepsis due to Methicillin resistant Staphylococcus aureus: Secondary | ICD-10-CM | POA: Diagnosis not present

## 2017-12-28 DIAGNOSIS — E1129 Type 2 diabetes mellitus with other diabetic kidney complication: Secondary | ICD-10-CM | POA: Diagnosis not present

## 2017-12-28 DIAGNOSIS — N2581 Secondary hyperparathyroidism of renal origin: Secondary | ICD-10-CM | POA: Diagnosis not present

## 2017-12-28 DIAGNOSIS — N186 End stage renal disease: Secondary | ICD-10-CM | POA: Diagnosis not present

## 2017-12-28 DIAGNOSIS — D631 Anemia in chronic kidney disease: Secondary | ICD-10-CM | POA: Diagnosis not present

## 2017-12-28 DIAGNOSIS — D509 Iron deficiency anemia, unspecified: Secondary | ICD-10-CM | POA: Diagnosis not present

## 2018-01-01 DIAGNOSIS — N2581 Secondary hyperparathyroidism of renal origin: Secondary | ICD-10-CM | POA: Diagnosis not present

## 2018-01-01 DIAGNOSIS — A4102 Sepsis due to Methicillin resistant Staphylococcus aureus: Secondary | ICD-10-CM | POA: Diagnosis not present

## 2018-01-01 DIAGNOSIS — D631 Anemia in chronic kidney disease: Secondary | ICD-10-CM | POA: Diagnosis not present

## 2018-01-01 DIAGNOSIS — N186 End stage renal disease: Secondary | ICD-10-CM | POA: Diagnosis not present

## 2018-01-01 DIAGNOSIS — D509 Iron deficiency anemia, unspecified: Secondary | ICD-10-CM | POA: Diagnosis not present

## 2018-01-01 DIAGNOSIS — E1129 Type 2 diabetes mellitus with other diabetic kidney complication: Secondary | ICD-10-CM | POA: Diagnosis not present

## 2018-01-04 DIAGNOSIS — E1129 Type 2 diabetes mellitus with other diabetic kidney complication: Secondary | ICD-10-CM | POA: Diagnosis not present

## 2018-01-04 DIAGNOSIS — A4102 Sepsis due to Methicillin resistant Staphylococcus aureus: Secondary | ICD-10-CM | POA: Diagnosis not present

## 2018-01-04 DIAGNOSIS — D631 Anemia in chronic kidney disease: Secondary | ICD-10-CM | POA: Diagnosis not present

## 2018-01-04 DIAGNOSIS — D509 Iron deficiency anemia, unspecified: Secondary | ICD-10-CM | POA: Diagnosis not present

## 2018-01-04 DIAGNOSIS — N186 End stage renal disease: Secondary | ICD-10-CM | POA: Diagnosis not present

## 2018-01-04 DIAGNOSIS — N2581 Secondary hyperparathyroidism of renal origin: Secondary | ICD-10-CM | POA: Diagnosis not present

## 2018-01-06 DIAGNOSIS — D631 Anemia in chronic kidney disease: Secondary | ICD-10-CM | POA: Diagnosis not present

## 2018-01-06 DIAGNOSIS — N2581 Secondary hyperparathyroidism of renal origin: Secondary | ICD-10-CM | POA: Diagnosis not present

## 2018-01-06 DIAGNOSIS — E1129 Type 2 diabetes mellitus with other diabetic kidney complication: Secondary | ICD-10-CM | POA: Diagnosis not present

## 2018-01-06 DIAGNOSIS — A4102 Sepsis due to Methicillin resistant Staphylococcus aureus: Secondary | ICD-10-CM | POA: Diagnosis not present

## 2018-01-06 DIAGNOSIS — D509 Iron deficiency anemia, unspecified: Secondary | ICD-10-CM | POA: Diagnosis not present

## 2018-01-06 DIAGNOSIS — N186 End stage renal disease: Secondary | ICD-10-CM | POA: Diagnosis not present

## 2018-01-11 DIAGNOSIS — D509 Iron deficiency anemia, unspecified: Secondary | ICD-10-CM | POA: Diagnosis not present

## 2018-01-11 DIAGNOSIS — A4102 Sepsis due to Methicillin resistant Staphylococcus aureus: Secondary | ICD-10-CM | POA: Diagnosis not present

## 2018-01-11 DIAGNOSIS — N2581 Secondary hyperparathyroidism of renal origin: Secondary | ICD-10-CM | POA: Diagnosis not present

## 2018-01-11 DIAGNOSIS — N186 End stage renal disease: Secondary | ICD-10-CM | POA: Diagnosis not present

## 2018-01-11 DIAGNOSIS — E1129 Type 2 diabetes mellitus with other diabetic kidney complication: Secondary | ICD-10-CM | POA: Diagnosis not present

## 2018-01-11 DIAGNOSIS — D631 Anemia in chronic kidney disease: Secondary | ICD-10-CM | POA: Diagnosis not present

## 2018-01-13 DIAGNOSIS — E1021 Type 1 diabetes mellitus with diabetic nephropathy: Secondary | ICD-10-CM | POA: Diagnosis not present

## 2018-01-13 DIAGNOSIS — E782 Mixed hyperlipidemia: Secondary | ICD-10-CM | POA: Diagnosis not present

## 2018-01-13 DIAGNOSIS — I69398 Other sequelae of cerebral infarction: Secondary | ICD-10-CM | POA: Diagnosis not present

## 2018-01-13 DIAGNOSIS — E10622 Type 1 diabetes mellitus with other skin ulcer: Secondary | ICD-10-CM | POA: Diagnosis not present

## 2018-01-13 DIAGNOSIS — N186 End stage renal disease: Secondary | ICD-10-CM | POA: Diagnosis not present

## 2018-01-13 DIAGNOSIS — E1051 Type 1 diabetes mellitus with diabetic peripheral angiopathy without gangrene: Secondary | ICD-10-CM | POA: Diagnosis not present

## 2018-01-13 DIAGNOSIS — I10 Essential (primary) hypertension: Secondary | ICD-10-CM | POA: Diagnosis not present

## 2018-01-13 DIAGNOSIS — I255 Ischemic cardiomyopathy: Secondary | ICD-10-CM | POA: Diagnosis not present

## 2018-01-13 DIAGNOSIS — E1059 Type 1 diabetes mellitus with other circulatory complications: Secondary | ICD-10-CM | POA: Diagnosis not present

## 2018-01-13 DIAGNOSIS — Z89512 Acquired absence of left leg below knee: Secondary | ICD-10-CM | POA: Diagnosis not present

## 2018-01-13 DIAGNOSIS — E1042 Type 1 diabetes mellitus with diabetic polyneuropathy: Secondary | ICD-10-CM | POA: Diagnosis not present

## 2018-01-13 DIAGNOSIS — I69321 Dysphasia following cerebral infarction: Secondary | ICD-10-CM | POA: Diagnosis not present

## 2018-01-15 DIAGNOSIS — N186 End stage renal disease: Secondary | ICD-10-CM | POA: Diagnosis not present

## 2018-01-15 DIAGNOSIS — A4102 Sepsis due to Methicillin resistant Staphylococcus aureus: Secondary | ICD-10-CM | POA: Diagnosis not present

## 2018-01-15 DIAGNOSIS — E1129 Type 2 diabetes mellitus with other diabetic kidney complication: Secondary | ICD-10-CM | POA: Diagnosis not present

## 2018-01-15 DIAGNOSIS — N2581 Secondary hyperparathyroidism of renal origin: Secondary | ICD-10-CM | POA: Diagnosis not present

## 2018-01-15 DIAGNOSIS — D631 Anemia in chronic kidney disease: Secondary | ICD-10-CM | POA: Diagnosis not present

## 2018-01-15 DIAGNOSIS — D509 Iron deficiency anemia, unspecified: Secondary | ICD-10-CM | POA: Diagnosis not present

## 2018-01-19 DIAGNOSIS — Z992 Dependence on renal dialysis: Secondary | ICD-10-CM | POA: Diagnosis not present

## 2018-01-19 DIAGNOSIS — E1122 Type 2 diabetes mellitus with diabetic chronic kidney disease: Secondary | ICD-10-CM | POA: Diagnosis not present

## 2018-01-19 DIAGNOSIS — N186 End stage renal disease: Secondary | ICD-10-CM | POA: Diagnosis not present

## 2018-01-20 DIAGNOSIS — D509 Iron deficiency anemia, unspecified: Secondary | ICD-10-CM | POA: Diagnosis not present

## 2018-01-20 DIAGNOSIS — D631 Anemia in chronic kidney disease: Secondary | ICD-10-CM | POA: Diagnosis not present

## 2018-01-20 DIAGNOSIS — N2581 Secondary hyperparathyroidism of renal origin: Secondary | ICD-10-CM | POA: Diagnosis not present

## 2018-01-20 DIAGNOSIS — N186 End stage renal disease: Secondary | ICD-10-CM | POA: Diagnosis not present

## 2018-01-25 DIAGNOSIS — D631 Anemia in chronic kidney disease: Secondary | ICD-10-CM | POA: Diagnosis not present

## 2018-01-25 DIAGNOSIS — D509 Iron deficiency anemia, unspecified: Secondary | ICD-10-CM | POA: Diagnosis not present

## 2018-01-25 DIAGNOSIS — N186 End stage renal disease: Secondary | ICD-10-CM | POA: Diagnosis not present

## 2018-01-25 DIAGNOSIS — N2581 Secondary hyperparathyroidism of renal origin: Secondary | ICD-10-CM | POA: Diagnosis not present

## 2018-01-26 DIAGNOSIS — M549 Dorsalgia, unspecified: Secondary | ICD-10-CM | POA: Diagnosis not present

## 2018-01-26 DIAGNOSIS — Z79899 Other long term (current) drug therapy: Secondary | ICD-10-CM | POA: Diagnosis not present

## 2018-01-26 DIAGNOSIS — I120 Hypertensive chronic kidney disease with stage 5 chronic kidney disease or end stage renal disease: Secondary | ICD-10-CM | POA: Diagnosis not present

## 2018-01-26 DIAGNOSIS — G8929 Other chronic pain: Secondary | ICD-10-CM | POA: Diagnosis not present

## 2018-01-26 DIAGNOSIS — E1122 Type 2 diabetes mellitus with diabetic chronic kidney disease: Secondary | ICD-10-CM | POA: Diagnosis not present

## 2018-01-26 DIAGNOSIS — N186 End stage renal disease: Secondary | ICD-10-CM | POA: Diagnosis not present

## 2018-01-26 DIAGNOSIS — Z794 Long term (current) use of insulin: Secondary | ICD-10-CM | POA: Diagnosis not present

## 2018-01-26 DIAGNOSIS — R079 Chest pain, unspecified: Secondary | ICD-10-CM | POA: Diagnosis not present

## 2018-01-27 ENCOUNTER — Emergency Department (HOSPITAL_COMMUNITY): Payer: Medicare Other

## 2018-01-27 ENCOUNTER — Inpatient Hospital Stay (HOSPITAL_COMMUNITY)
Admission: EM | Admit: 2018-01-27 | Discharge: 2018-02-01 | DRG: 291 | Disposition: A | Discharge status: Skilled Nursing Facility | Payer: Medicare Other | Attending: Family Medicine | Admitting: Family Medicine

## 2018-01-27 ENCOUNTER — Encounter (HOSPITAL_COMMUNITY): Payer: Self-pay | Admitting: Emergency Medicine

## 2018-01-27 DIAGNOSIS — Z7982 Long term (current) use of aspirin: Secondary | ICD-10-CM | POA: Diagnosis not present

## 2018-01-27 DIAGNOSIS — N2581 Secondary hyperparathyroidism of renal origin: Secondary | ICD-10-CM | POA: Diagnosis present

## 2018-01-27 DIAGNOSIS — D631 Anemia in chronic kidney disease: Secondary | ICD-10-CM | POA: Diagnosis present

## 2018-01-27 DIAGNOSIS — R0600 Dyspnea, unspecified: Secondary | ICD-10-CM | POA: Diagnosis not present

## 2018-01-27 DIAGNOSIS — R1312 Dysphagia, oropharyngeal phase: Secondary | ICD-10-CM | POA: Diagnosis not present

## 2018-01-27 DIAGNOSIS — K5900 Constipation, unspecified: Secondary | ICD-10-CM | POA: Diagnosis not present

## 2018-01-27 DIAGNOSIS — Z91048 Other nonmedicinal substance allergy status: Secondary | ICD-10-CM

## 2018-01-27 DIAGNOSIS — R278 Other lack of coordination: Secondary | ICD-10-CM | POA: Diagnosis not present

## 2018-01-27 DIAGNOSIS — I5023 Acute on chronic systolic (congestive) heart failure: Secondary | ICD-10-CM | POA: Diagnosis present

## 2018-01-27 DIAGNOSIS — I6930 Unspecified sequelae of cerebral infarction: Secondary | ICD-10-CM | POA: Diagnosis not present

## 2018-01-27 DIAGNOSIS — I120 Hypertensive chronic kidney disease with stage 5 chronic kidney disease or end stage renal disease: Secondary | ICD-10-CM | POA: Diagnosis not present

## 2018-01-27 DIAGNOSIS — Z89519 Acquired absence of unspecified leg below knee: Secondary | ICD-10-CM | POA: Diagnosis not present

## 2018-01-27 DIAGNOSIS — Z794 Long term (current) use of insulin: Secondary | ICD-10-CM | POA: Diagnosis not present

## 2018-01-27 DIAGNOSIS — Z885 Allergy status to narcotic agent status: Secondary | ICD-10-CM

## 2018-01-27 DIAGNOSIS — E8779 Other fluid overload: Secondary | ICD-10-CM

## 2018-01-27 DIAGNOSIS — R531 Weakness: Secondary | ICD-10-CM

## 2018-01-27 DIAGNOSIS — N186 End stage renal disease: Secondary | ICD-10-CM | POA: Diagnosis not present

## 2018-01-27 DIAGNOSIS — Z5900 Homelessness unspecified: Secondary | ICD-10-CM

## 2018-01-27 DIAGNOSIS — E785 Hyperlipidemia, unspecified: Secondary | ICD-10-CM | POA: Diagnosis not present

## 2018-01-27 DIAGNOSIS — E669 Obesity, unspecified: Secondary | ICD-10-CM | POA: Diagnosis not present

## 2018-01-27 DIAGNOSIS — I132 Hypertensive heart and chronic kidney disease with heart failure and with stage 5 chronic kidney disease, or end stage renal disease: Secondary | ICD-10-CM | POA: Diagnosis not present

## 2018-01-27 DIAGNOSIS — Z880 Allergy status to penicillin: Secondary | ICD-10-CM

## 2018-01-27 DIAGNOSIS — R279 Unspecified lack of coordination: Secondary | ICD-10-CM | POA: Diagnosis not present

## 2018-01-27 DIAGNOSIS — J189 Pneumonia, unspecified organism: Secondary | ICD-10-CM | POA: Diagnosis not present

## 2018-01-27 DIAGNOSIS — E8770 Fluid overload, unspecified: Secondary | ICD-10-CM | POA: Diagnosis not present

## 2018-01-27 DIAGNOSIS — Z87891 Personal history of nicotine dependence: Secondary | ICD-10-CM

## 2018-01-27 DIAGNOSIS — I69322 Dysarthria following cerebral infarction: Secondary | ICD-10-CM

## 2018-01-27 DIAGNOSIS — I6381 Other cerebral infarction due to occlusion or stenosis of small artery: Secondary | ICD-10-CM | POA: Diagnosis not present

## 2018-01-27 DIAGNOSIS — L309 Dermatitis, unspecified: Secondary | ICD-10-CM | POA: Diagnosis not present

## 2018-01-27 DIAGNOSIS — I429 Cardiomyopathy, unspecified: Secondary | ICD-10-CM | POA: Diagnosis not present

## 2018-01-27 DIAGNOSIS — M6281 Muscle weakness (generalized): Secondary | ICD-10-CM | POA: Diagnosis not present

## 2018-01-27 DIAGNOSIS — D638 Anemia in other chronic diseases classified elsewhere: Secondary | ICD-10-CM | POA: Diagnosis not present

## 2018-01-27 DIAGNOSIS — Z9114 Patient's other noncompliance with medication regimen: Secondary | ICD-10-CM

## 2018-01-27 DIAGNOSIS — Z9119 Patient's noncompliance with other medical treatment and regimen: Secondary | ICD-10-CM | POA: Diagnosis not present

## 2018-01-27 DIAGNOSIS — Z79899 Other long term (current) drug therapy: Secondary | ICD-10-CM

## 2018-01-27 DIAGNOSIS — R0603 Acute respiratory distress: Secondary | ICD-10-CM

## 2018-01-27 DIAGNOSIS — E1122 Type 2 diabetes mellitus with diabetic chronic kidney disease: Secondary | ICD-10-CM | POA: Diagnosis not present

## 2018-01-27 DIAGNOSIS — R0602 Shortness of breath: Secondary | ICD-10-CM | POA: Diagnosis not present

## 2018-01-27 DIAGNOSIS — Z992 Dependence on renal dialysis: Secondary | ICD-10-CM

## 2018-01-27 DIAGNOSIS — R471 Dysarthria and anarthria: Secondary | ICD-10-CM | POA: Diagnosis not present

## 2018-01-27 DIAGNOSIS — Z89512 Acquired absence of left leg below knee: Secondary | ICD-10-CM

## 2018-01-27 DIAGNOSIS — I160 Hypertensive urgency: Secondary | ICD-10-CM | POA: Diagnosis not present

## 2018-01-27 DIAGNOSIS — E8889 Other specified metabolic disorders: Secondary | ICD-10-CM | POA: Diagnosis present

## 2018-01-27 DIAGNOSIS — I5033 Acute on chronic diastolic (congestive) heart failure: Secondary | ICD-10-CM | POA: Diagnosis not present

## 2018-01-27 DIAGNOSIS — Z743 Need for continuous supervision: Secondary | ICD-10-CM | POA: Diagnosis not present

## 2018-01-27 DIAGNOSIS — R079 Chest pain, unspecified: Secondary | ICD-10-CM | POA: Diagnosis not present

## 2018-01-27 DIAGNOSIS — R262 Difficulty in walking, not elsewhere classified: Secondary | ICD-10-CM | POA: Diagnosis not present

## 2018-01-27 DIAGNOSIS — I10 Essential (primary) hypertension: Secondary | ICD-10-CM | POA: Diagnosis not present

## 2018-01-27 LAB — I-STAT BETA HCG BLOOD, ED (MC, WL, AP ONLY): I-stat hCG, quantitative: <5 m[IU]/mL (ref <5)

## 2018-01-27 LAB — CBC
HCT: 32 % — ABNORMAL LOW (ref 36.0–46.0)
HEMOGLOBIN: 9.7 g/dL — AB (ref 12.0–15.0)
MCH: 27.5 pg (ref 26.0–34.0)
MCHC: 30.3 g/dL (ref 30.0–36.0)
MCV: 90.7 fL (ref 78.0–100.0)
Platelets: 163 10*3/uL (ref 150–400)
RBC: 3.53 MIL/uL — ABNORMAL LOW (ref 3.87–5.11)
RDW: 18.6 % — ABNORMAL HIGH (ref 11.5–15.5)
WBC: 5.5 10*3/uL (ref 4.0–10.5)

## 2018-01-27 LAB — BASIC METABOLIC PANEL
ANION GAP: 16 — AB (ref 5–15)
BUN: 44 mg/dL — ABNORMAL HIGH (ref 6–20)
CHLORIDE: 99 mmol/L — AB (ref 101–111)
CO2: 23 mmol/L (ref 22–32)
Calcium: 9 mg/dL (ref 8.9–10.3)
Creatinine, Ser: 11.01 mg/dL — ABNORMAL HIGH (ref 0.44–1.00)
GFR calc Af Amer: 4 mL/min — ABNORMAL LOW (ref >60)
GFR calc non Af Amer: 4 mL/min — ABNORMAL LOW (ref >60)
GLUCOSE: 215 mg/dL — AB (ref 65–99)
POTASSIUM: 4.5 mmol/L (ref 3.5–5.1)
Sodium: 138 mmol/L (ref 135–145)

## 2018-01-27 LAB — HEPATIC FUNCTION PANEL
ALT: 17 U/L (ref 14–54)
AST: 15 U/L (ref 15–41)
Albumin: 3.5 g/dL (ref 3.5–5.0)
Alkaline Phosphatase: 68 U/L (ref 38–126)
BILIRUBIN INDIRECT: 0.5 mg/dL (ref 0.3–0.9)
Bilirubin, Direct: 0.1 mg/dL (ref 0.1–0.5)
TOTAL PROTEIN: 7.6 g/dL (ref 6.5–8.1)
Total Bilirubin: 0.6 mg/dL (ref 0.3–1.2)

## 2018-01-27 LAB — CK: CK TOTAL: 44 U/L (ref 38–234)

## 2018-01-27 LAB — CBG MONITORING, ED: Glucose-Capillary: 123 mg/dL — ABNORMAL HIGH (ref 65–99)

## 2018-01-27 LAB — BRAIN NATRIURETIC PEPTIDE: B Natriuretic Peptide: 1391.7 pg/mL — ABNORMAL HIGH (ref 0.0–100.0)

## 2018-01-27 MED ORDER — ACETAMINOPHEN 650 MG RE SUPP: 650.0000 mg | Freq: Four times a day (QID) | RECTAL | Status: DC | PRN

## 2018-01-27 MED ORDER — ACETAMINOPHEN 325 MG PO TABS
650.0000 mg | ORAL_TABLET | Freq: Four times a day (QID) | ORAL | Status: DC | PRN
  Administered 2018-01-28 – 2018-01-30 (×6): 650 mg via ORAL
  Filled 2018-01-27 (×6): qty 2

## 2018-01-27 MED ORDER — ONDANSETRON HCL 4 MG/2ML IJ SOLN: 4.0000 mg | Freq: Four times a day (QID) | INTRAMUSCULAR | Status: DC | PRN

## 2018-01-27 MED ORDER — ATORVASTATIN CALCIUM 80 MG PO TABS
80.0000 mg | ORAL_TABLET | Freq: Every day | ORAL | Status: DC
  Administered 2018-01-29 – 2018-01-30 (×2): 80 mg via ORAL
  Filled 2018-01-27 (×3): qty 1

## 2018-01-27 MED ORDER — HEPARIN SODIUM (PORCINE) 5000 UNIT/ML IJ SOLN
5000.0000 [IU] | Freq: Three times a day (TID) | INTRAMUSCULAR | Status: DC
  Administered 2018-01-27 – 2018-01-31 (×11): 5000 [IU] via SUBCUTANEOUS
  Filled 2018-01-27 (×13): qty 1

## 2018-01-27 MED ORDER — AMLODIPINE BESYLATE 10 MG PO TABS
10.0000 mg | ORAL_TABLET | Freq: Every day | ORAL | Status: DC
  Filled 2018-01-27: qty 2

## 2018-01-27 MED ORDER — HYDRALAZINE HCL 20 MG/ML IJ SOLN
5.0000 mg | Freq: Once | INTRAMUSCULAR | Status: AC
  Administered 2018-01-27: 5 mg via INTRAVENOUS
  Filled 2018-01-27: qty 1

## 2018-01-27 MED ORDER — INSULIN ASPART 100 UNIT/ML ~~LOC~~ SOLN: 0.0000 [IU] | Freq: Three times a day (TID) | SUBCUTANEOUS | Status: DC

## 2018-01-27 MED ORDER — INSULIN REGULAR HUMAN 100 UNIT/ML IJ SOLN
0.0000 [IU] | Freq: Three times a day (TID) | INTRAMUSCULAR | Status: DC
  Administered 2018-01-28 – 2018-01-29 (×2): 1 [IU] via SUBCUTANEOUS
  Administered 2018-01-29 – 2018-01-30 (×3): 2 [IU] via SUBCUTANEOUS
  Administered 2018-01-30: 1 [IU] via SUBCUTANEOUS
  Administered 2018-01-31: 2 [IU] via SUBCUTANEOUS
  Administered 2018-01-31: 3 [IU] via SUBCUTANEOUS
  Administered 2018-01-31 – 2018-02-01 (×2): 1 [IU] via SUBCUTANEOUS
  Administered 2018-02-01: 2 [IU] via SUBCUTANEOUS
  Filled 2018-01-27 (×27): qty 0.09

## 2018-01-27 MED ORDER — FENTANYL CITRATE (PF) 100 MCG/2ML IJ SOLN
50.0000 ug | Freq: Once | INTRAMUSCULAR | Status: AC
Start: 2018-01-27 — End: 2018-01-27
  Administered 2018-01-27: 50 ug via INTRAVENOUS
  Filled 2018-01-27: qty 2

## 2018-01-27 MED ORDER — ONDANSETRON HCL 4 MG PO TABS: 4.0000 mg | ORAL_TABLET | Freq: Four times a day (QID) | ORAL | Status: DC | PRN

## 2018-01-27 MED ORDER — ACETAMINOPHEN 325 MG PO TABS
650.0000 mg | ORAL_TABLET | Freq: Once | ORAL | Status: DC
  Filled 2018-01-27: qty 2

## 2018-01-27 NOTE — ED Notes (Signed)
Attempted to call report. RN to call back. 

## 2018-01-27 NOTE — H&P (Addendum)
[facility] Admission History and Physical Service Pager: [phone]  Patient name: Karen [name] Medical record number: [id number] Date of birth: 08-Apr-1976 Age: 42 y.o. Gender: female  Primary Care Provider: Patient, No Pcp Per Consultants: Nephrology Code Status: Full  Chief Complaint: Shortness of breath, cough, LE pain  Assessment and Plan: Karen [name] is a 42 y.o. female with a past medical history significant for ESRD on dialysis (T, Th, S), HTN, stroke,T2DM, PAD s/p left BKA, ischemic cardiomyopathy presenting with shortness of breath, cough, extremity pain and found to have volume overload in the setting of multiple missed HD sessions.   #Shortness of breath, cough, acute, stable Patient presented with cough, shortness of breath, for the past few days worsening in the past 24 hours.  Patient had half a session of dialysis on Tuesday and has missed Saturday and today's sessions.  Lung exam with diffuse crackles bilaterally upper and lower lung fields.  Lower extremity edema and BNP elevated although not reliable in the ESRD patient. Chest x-ray shows signs of mild pulmonary edema.  Patient has good oxygen saturation and is able to speak in full sentences although she has significant speech impediment secondary to stroke.  Difficulty breathing and cough likely secondary to volume overload noted on physical exam and imaging in the setting of missed dialysis.  Patient also has a history of ischemic cardiomyopathy with last echo in December 2018 showing an EF 35%-40% with diffuse hypokinesis and grade 3 diastolic dysfunction. Could also be contributing to symptoms.  Patient reports multiple choking episodes since stroke in December.  Shortness of breath and cough could also be secondary to possible aspiration pneumonia although chest x-ray is not consistent with infectious picture with WBC within normal limit and no fever reported. --Admit to [facility], admitting  physician Dr. Andria Frames --Admit to [facility] --Consult nephrology --Oxygen therapy as needed --Follow-up on a.m. CBC and renal panel  --Acetaminophen 600 mg every 6 as needed --Zofran 4 mg every 6 as needed --Strict I's and O's --Daily weights --PT/OT  #ESRD on HD (T,Th,S) Patient has missed multiple sessions in the past week and only received half a session on Tuesday.  Sodium 138, potassium 4.5, BUN 44, creatinine 11.3. Physical exam with clear signs of volume overload such as crackles and lower extremity edema.  Patient will need dialysis.  Vital signs are reassuring.  Patient does not need emergent dialysis. --Nephrology consulted will see patient in the morning --Oxygen therapy as needed --NPO --Monitor volume status --F/u on renal panel in the am  #Hypertension, uncontrolled Patient blood pressure on admission was 189/122.  Patient reports nonadherence to medication for the past month.  Hypertension likely secondary to volume overload in the setting of missed HD as well as medication nonadherence. --Hydralazine as needed --Resume amlodipine 10 mg daily  #Type 2 diabetes Blood glucose on admission 215.  Patient is on 10 units of Lantus at night and Humalog 2-5 units 3 times daily. --Discontinue Lantus given n.p.o. Status --Start patient on sensitive setting scale titrate as needed  #Left lower leg pain, chronic Patient reports moderate pain in her back and left lower leg.  Pain seems to be chronic in nature secondary to neuropathy.  Patient has multiple skin lesion without any signs of infection likely related to renal failure and chronic uremia.  Patient is also using a wheelchair most of the time.  She has a prosthesis for her right leg but needs assistance for ambulation.  Patient is currently on  Percocet 10/325 mg. --Tylenol 650 every 6 as needed -- Fentanyl 50 mcg once, repeat as needed  #Aspiration risk status post CVA December 2018 Patient and caseworker report multiple  choking episode in the past few weeks most recently while eating as yesterday.  Patient with history of stroke back in December 2018.  Exhibits clear speech difficulty secondary to stroke.  Patient likely needs swallow eval and is at increased risk for aspiration. --Speech evaluation for aspiration --Aspirin 325 mg daily --Atorvastatin 80 mg daily  #Anemia, chronic Today hemoglobin 9.7.  Baseline recently around 11.  Likely from chronic disease in the setting of ESRD.  Patient is receiving Aranesp with hemodialysis.  We will continue to monitor --Follow-up on a.m. CBC  FEN/GI: N.p.o. Prophylaxis: Heparin   Disposition: Will likely need SNF placement   History of Present Illness:  Karen [name] is a 42 y.o. female with a past medical history significant for ESRD on dialysis (T, Th, S), HTN, stroke,T2DM, PAD s/p BKA, ischemic cardiomyopathy  presenting with shortness of breath and lower extremity pain and emesis. Patient reports she has been been unable to go to dialysis for the past few days. Patient missed Saturday HD session and only had 1/2 a session Tuesday was not able to go today. Patient has had transportation issues for the past few weeks because she currently does not have a home and has been staying with family and friend. Patient has used [facility] in the past to go to dialysis but for the past few weeks due to constant moving has been unable to use the service. Patient reports worsening shortness of breath yesterday and multiple episode of emesis as well as right leg pain. Patient reports yesterday she had a choking episode with elevated BP while at [facility]. EMS was called and she was taken to [place] where they treated her pain and discharged her. She reports spending the night in [place] and return to [facility] this morning when she was sent [facility] the ED at [facility]. Story was confirmed by her Case Worker who happened to be in the ED tonight. Patient had a very similar admission at the end of  February.  According to caseworker, patient's primary caregiver is 10 year old son. In the ED patient was found to have sign of fluid overload but maintain good oxygen saturation (99%) without any increase work of breathing. CXR showed stable cardiomegaly and signs of mild pulmonary edema.  Creatinine was 11.3 BUN of 44, BNP was 1391. Family medicine teaching service was consulted for admission.  Review Of Systems: Per HPI with the following additions:  Review of Systems  Respiratory: Positive for shortness of breath.   Cardiovascular:       Right leg pain  Gastrointestinal: Positive for vomiting.  Genitourinary: Negative.   Musculoskeletal: Positive for back pain.  Neurological: Negative.   Endo/Heme/Allergies: Negative.   Psychiatric/Behavioral: Negative.     Patient Active Problem List   Diagnosis Date Noted  . Hypertension, uncontrolled 11/11/2017  . Fluid overload 11/11/2017  . Abdominal pain   . Dysarthria 09/14/2017  . Lacunar infarct, acute ([place]) 09/14/2017  . Obesity 09/09/2017  . Chest pain 09/07/2017  . Fluid overload, unspecified 10/03/2016  . Hyperkalemia 10/03/2016  . Hypertension with fluid overload 10/03/2016  . Anemia in other chronic diseases classified elsewhere 09/08/2016  . High anion gap metabolic acidosis 37/16/9678  . Right lower lobe pneumonia ([place]) 09/08/2016  . Uncontrolled type 2 diabetes mellitus with chronic kidney disease on chronic dialysis, with long-term  current use of insulin ([place]) 01/31/2016  . Noncompliance with medications   . End-stage renal disease on hemodialysis ([place])   . Hx of BKA ([place]) 07/03/2013  . S/P BKA (below knee amputation) unilateral ([place]) 06/30/2013  . Physical deconditioning 06/30/2013  . Cardiomyopathy, ischemic - EF 45-50% with inf WMA by 2D 02/05/13 02/06/2013  . Essential hypertension 02/05/2013  . PAD (peripheral artery disease) ([place]) 02/05/2013  . Diabetic neuropathy ([place])   . Osteomyelitis of left great toe - S/P  amputation 02/06/13 04/21/2011    Past Medical History: Past Medical History:  Diagnosis Date  . Anemia   . Anxiety   . Dyspnea    "when I dont go to dialysis"  . Eczema   . ESRD (end stage renal disease) ([place])    [facility] - [facility]- [facility]  . Exertional shortness of breath    "recently; it's fluid" (02/02/2013)  . GERD (gastroesophageal reflux disease)   . Headache   . History of blood transfusion    "last week" (02/02/2013)  . History of kidney stones    passed  . Hypertension   . Ischemic cardiomyopathy    by echo 2014  . Neuropathy   . Neuropathy   . Osteomyelitis of toe of left foot ([place])    "off and on since 2009; no OR" (02/02/2013)  . Pneumonia 09/2016  . Type II diabetes mellitus ([place]) 1995    Past Surgical History: Past Surgical History:  Procedure Laterality Date  . AMPUTATION Left 02/06/2013   Procedure: AMPUTATION LEFT GREAT TOE;  Surgeon: Wylene Simmer, MD;  Location: [facility];  Service: Orthopedics;  Laterality: Left;  . AMPUTATION Left 06/24/2013   Procedure: AMPUTATION BELOW KNEE ;  Surgeon: Wylene Simmer, MD;  Location: [facility];  Service: Orthopedics;  Laterality: Left;  . AV FISTULA PLACEMENT Right 02/08/2017   Procedure: CREATION OF RIGHT ARM  BASILIC VEIN TO BRACHIAL ARTERY ARTERIOVENOUS (AV) FISTULA;  Surgeon: Rosetta Posner, MD;  Location: [facility];  Service: Vascular;  Laterality: Right;  . AV FISTULA PLACEMENT Left 09/10/2017   Procedure: INSERTION OF ARTERIOVENOUS (AV) GORE-TEX GRAFT  LEFT UPPER ARM;  Surgeon: Angelia Mould, MD;  Location: [facility];  Service: Vascular;  Laterality: Left;  . BASCILIC VEIN TRANSPOSITION Right 04/14/2017   Procedure: BASCILIC VEIN TRANSPOSITION-RIGHT 2ND STAGE;  Surgeon: Rosetta Posner, MD;  Location: [facility];  Service: Vascular;  Laterality: Right;  . CESAREAN SECTION  10/18/2006  . INSERTION OF DIALYSIS CATHETER      Social History: Social History   Tobacco Use  . Smoking status: Former Smoker    Packs/day: 3.00    Years:  10.00    Pack years: 30.00    Types: Cigarettes    Last attempt to quit: 09/21/2004    Years since quitting: 13.3  . Smokeless tobacco: Never Used  Substance Use Topics  . Alcohol use: No  . Drug use: No   Additional social history:  Please also refer to relevant sections of EMR.  Family History: Family History  Problem Relation Age of Onset  . Hypertension Mother   . Diabetes Mother   . Diabetes Father    (If not completed, MUST add something in)  Allergies and Medications: Allergies  Allergen Reactions  . Baclofen Itching  . Penicillins Anaphylaxis, Hives, Rash and Other (See Comments)    PATIENT HAD A PCN REACTION WITH IMMEDIATE RASH, FACIAL/TONGUE/THROAT SWELLING, SOB, OR LIGHTHEADEDNESS WITH HYPOTENSION:  #  #  #  YES  #  #  #  SEVERE RASH INVOLVING MUCUS MEMBRANES or SKIN NECROSIS: #  #  #  YES  #  #  # Has patient had a PCN reaction that required hospitalization No Has patient had a PCN reaction occurring within the last 10 years: No If all of the above answers are "NO", then may proceed with Cephalosporin use.  09/10/16- tolerated Cefepime   . Morphine And Related Hives and Rash  . Novolog [Insulin Aspart] Other (See Comments)    Cramps/ Gi distress   No current facility-administered medications on file prior to encounter.    Current Outpatient Medications on File Prior to Encounter  Medication Sig Dispense Refill  . amLODipine (NORVASC) 5 MG tablet Take 2 tablets (10 mg total) by mouth daily. 30 tablet 0  . aspirin 325 MG tablet Take 1 tablet (325 mg total) by mouth daily. 30 tablet 0  . atorvastatin (LIPITOR) 80 MG tablet TAKE 1 TABLET(80 MG) BY MOUTH DAILY AT 6 PM 90 tablet 0  . calcitRIOL (ROCALTROL) 0.5 MCG capsule Take 3 capsules (1.5 mcg total) by mouth Every Tuesday,Thursday,and Saturday with dialysis. 90 capsule 0  . calcium acetate (PHOSLO) 667 MG capsule Take 2 capsules (1,334 mg total) by mouth 3 (three) times daily with meals. 90 capsule 0  .  cinacalcet (SENSIPAR) 30 MG tablet Take 1 tablet (30 mg total) by mouth Every Tuesday,Thursday,and Saturday with dialysis. 60 tablet 0  . Darbepoetin Alfa (ARANESP) 150 MCG/0.3ML SOSY injection Inject 0.3 mLs (150 mcg total) into the vein every Thursday with hemodialysis. (Patient not taking: Reported on 11/13/2017) 1.68 mL 0  . insulin glargine (LANTUS) 100 UNIT/ML injection Inject 0.1 mLs (10 Units total) into the skin at bedtime. 10 mL 11  . insulin lispro (HUMALOG) 100 UNIT/ML injection Inject 2-5 Units into the skin 3 (three) times daily as needed for high blood sugar. Per sliding scale if blood sugar is over 150    . multivitamin (RENA-VIT) TABS tablet Take 1 tablet by mouth at bedtime. 30 tablet 0  . oxyCODONE-acetaminophen (PERCOCET) 10-325 MG tablet Take 1 tablet by mouth 3 (three) times daily.      Objective: BP (!) 175/95   Pulse 90   Temp 98 F (36.7 C) (Oral)   Resp (!) 26   Ht 5\' 6"  (1.676 m)   Wt 210 lb (95.3 kg)   LMP 01/19/2018   SpO2 99%   BMI 33.89 kg/m  General: Appears older than age, difficult to understand due to speech, no acute distress Cardiac: RRR, normal heart sounds, no murmurs. 2+ radial and PT pulses bilaterally Respiratory: Bilateral crackle upper and lower lung fields, no wheezing Abdomen: soft, nontender, nondistended, no hepatic or splenomegaly, +BS Extremities: Mild right lower extremity edema (trace), Left BKA Skin: warm and dry, multiple dry papular lesions throughout body.  No drainage. Neuro: alert and oriented x4, difficulty with speech Psych: Normal affect and irritable at times   Labs and Imaging: CBC BMET  Recent Labs  Lab 01/27/18 1350  WBC 5.5  HGB 9.7*  HCT 32.0*  PLT 163   Recent Labs  Lab 01/27/18 1350  NA 138  K 4.5  CL 99*  CO2 23  BUN 44*  CREATININE 11.01*  GLUCOSE 215*  CALCIUM 9.0     Dg Chest 2 View  Result Date: 01/27/2018 CLINICAL DATA:  42 y/o  F; weak all over and shortness of breath. EXAM: CHEST - 2 VIEW  COMPARISON:  01/26/2018 chest radiograph FINDINGS: Stable cardiomegaly given projection and technique.  Pulmonary venous hypertension and hazy opacification of the lungs. No pleural effusion or pneumothorax. No focal consolidation. Bones are unremarkable. IMPRESSION: Stable cardiomegaly. Pulmonary venous hypertension and hazy lungs probably representing mild pulmonary edema. Electronically Signed   By: Kristine Garbe M.D.   On: 01/27/2018 18:42     Marjie Skiff, MD [place], PGY-2

## 2018-01-27 NOTE — ED Provider Notes (Signed)
[facility] EMERGENCY DEPARTMENT Provider Note   CSN: [id number] Arrival date & time: 01/27/18  1339     History   Chief Complaint Chief Complaint  Patient presents with  . Weakness    HPI Karen [name] is a 42 y.o. female with a past medical history of ESRD on HT TTS, DM2, ischemic cardiomyopathy, S/p left BKA, who presents today for evaluation of weakness.  She reports that she is currently in the process of moving.  She did not go to her dialysis today.  She reports that on Tuesday she only had part of her dialysis treatment to her dialysis normally here at [facility].  She reports that she was only able to get half a treatment because she had to leave as she is trying to move.  She reports that normally when she feels like there is it is because she is fluid overloaded.  She denies any fevers or chills.  She does report that her right lower leg feels tight and full.  She reports that her rash is unchanged from baseline, has been present for multiple years.  HPI  Past Medical History:  Diagnosis Date  . Anemia   . Anxiety   . Dyspnea    "when I dont go to dialysis"  . Eczema   . ESRD (end stage renal disease) ([place])    Hemo - TTHSAT-   . Exertional shortness of breath    "recently; it's fluid" (02/02/2013)  . GERD (gastroesophageal reflux disease)   . Headache   . History of blood transfusion    "last week" (02/02/2013)  . History of kidney stones    passed  . Hypertension   . Ischemic cardiomyopathy    by echo 2014  . Neuropathy   . Neuropathy   . Osteomyelitis of toe of left foot ([place])    "off and on since 2009; no OR" (02/02/2013)  . Pneumonia 09/2016  . Type II diabetes mellitus ([place]) 1995    Patient Active Problem List   Diagnosis Date Noted  . Dyspnea 01/27/2018  . Hypertension, uncontrolled 11/11/2017  . Fluid overload 11/11/2017  . Abdominal pain   . Dysarthria 09/14/2017  . Lacunar infarct, acute ([place]) 09/14/2017  . Obesity  09/09/2017  . Chest pain 09/07/2017  . Fluid overload, unspecified 10/03/2016  . Hyperkalemia 10/03/2016  . Hypertension with fluid overload 10/03/2016  . Anemia in other chronic diseases classified elsewhere 09/08/2016  . High anion gap metabolic acidosis 17/40/8144  . Right lower lobe pneumonia ([place]) 09/08/2016  . Uncontrolled type 2 diabetes mellitus with chronic kidney disease on chronic dialysis, with long-term current use of insulin ([place]) 01/31/2016  . Noncompliance with medications   . End-stage renal disease on hemodialysis ([place])   . Hx of BKA ([place]) 07/03/2013  . S/P BKA (below knee amputation) unilateral ([place]) 06/30/2013  . Physical deconditioning 06/30/2013  . Cardiomyopathy, ischemic - EF 45-50% with inf WMA by 2D 02/05/13 02/06/2013  . Essential hypertension 02/05/2013  . PAD (peripheral artery disease) ([place]) 02/05/2013  . Diabetic neuropathy ([place])   . Osteomyelitis of left great toe - S/P amputation 02/06/13 04/21/2011    Past Surgical History:  Procedure Laterality Date  . AMPUTATION Left 02/06/2013   Procedure: AMPUTATION LEFT GREAT TOE;  Surgeon: Wylene Simmer, MD;  Location: [facility];  Service: Orthopedics;  Laterality: Left;  . AMPUTATION Left 06/24/2013   Procedure: AMPUTATION BELOW KNEE ;  Surgeon: Wylene Simmer, MD;  Location: [facility];  Service: Orthopedics;  Laterality: Left;  . AV FISTULA PLACEMENT Right 02/08/2017   Procedure: CREATION OF RIGHT ARM  BASILIC VEIN TO BRACHIAL ARTERY ARTERIOVENOUS (AV) FISTULA;  Surgeon: Rosetta Posner, MD;  Location: [facility];  Service: Vascular;  Laterality: Right;  . AV FISTULA PLACEMENT Left 09/10/2017   Procedure: INSERTION OF ARTERIOVENOUS (AV) GORE-TEX GRAFT  LEFT UPPER ARM;  Surgeon: Angelia Mould, MD;  Location: [facility];  Service: Vascular;  Laterality: Left;  . BASCILIC VEIN TRANSPOSITION Right 04/14/2017   Procedure: BASCILIC VEIN TRANSPOSITION-RIGHT 2ND STAGE;  Surgeon: Rosetta Posner, MD;  Location: [facility];  Service: Vascular;   Laterality: Right;  . CESAREAN SECTION  10/18/2006  . INSERTION OF DIALYSIS CATHETER       OB History   None      Home Medications    Prior to Admission medications   Medication Sig Start Date End Date Taking? Authorizing Provider  amLODipine (NORVASC) 5 MG tablet Take 2 tablets (10 mg total) by mouth daily. 11/13/17   Debbe Odea, MD  aspirin 325 MG tablet Take 1 tablet (325 mg total) by mouth daily. 09/16/17   Melanee Spry, MD  atorvastatin (LIPITOR) 80 MG tablet TAKE 1 TABLET(80 MG) BY MOUTH DAILY AT 6 PM 09/16/17   Lacroce, Hulen Shouts, MD  calcitRIOL (ROCALTROL) 0.5 MCG capsule Take 3 capsules (1.5 mcg total) by mouth Every Tuesday,Thursday,and Saturday with dialysis. 11/13/17   Debbe Odea, MD  calcium acetate (PHOSLO) 667 MG capsule Take 2 capsules (1,334 mg total) by mouth 3 (three) times daily with meals. 11/13/17   Debbe Odea, MD  cinacalcet (SENSIPAR) 30 MG tablet Take 1 tablet (30 mg total) by mouth Every Tuesday,Thursday,and Saturday with dialysis. 11/13/17   Debbe Odea, MD  Darbepoetin Alfa (ARANESP) 150 MCG/0.3ML SOSY injection Inject 0.3 mLs (150 mcg total) into the vein every Thursday with hemodialysis. Patient not taking: Reported on 11/13/2017 09/16/17   Melanee Spry, MD  insulin glargine (LANTUS) 100 UNIT/ML injection Inject 0.1 mLs (10 Units total) into the skin at bedtime. 09/15/17   Lacroce, Hulen Shouts, MD  insulin lispro (HUMALOG) 100 UNIT/ML injection Inject 2-5 Units into the skin 3 (three) times daily as needed for high blood sugar. Per sliding scale if blood sugar is over 150    [provider]  multivitamin (RENA-VIT) TABS tablet Take 1 tablet by mouth at bedtime. 11/13/17   Debbe Odea, MD  oxyCODONE-acetaminophen (PERCOCET) 10-325 MG tablet Take 1 tablet by mouth 3 (three) times daily.    [provider]    Family History Family History  Problem Relation Age of Onset  . Hypertension Mother   . Diabetes Mother   .  Diabetes Father     Social History Social History   Tobacco Use  . Smoking status: Former Smoker    Packs/day: 3.00    Years: 10.00    Pack years: 30.00    Types: Cigarettes    Last attempt to quit: 09/21/2004    Years since quitting: 13.3  . Smokeless tobacco: Never Used  Substance Use Topics  . Alcohol use: No  . Drug use: No     Allergies   Baclofen; Penicillins; Morphine and related; and Novolog [insulin aspart]   Review of Systems Review of Systems  Constitutional: Negative for chills and fever.  HENT: Negative for ear pain and sore throat.   Eyes: Negative for pain and visual disturbance.  Respiratory: Negative for cough and shortness of breath.   Cardiovascular: Negative for  chest pain and palpitations.  Gastrointestinal: Negative for abdominal pain and vomiting.  Genitourinary: Negative for flank pain.  Musculoskeletal: Positive for arthralgias, back pain and myalgias.       "My whole body hurts the same"  "No one area hurts more."  Skin: Negative for color change and rash.  Neurological: Negative for seizures and syncope.  All other systems reviewed and are negative.    Physical Exam Updated Vital Signs BP (!) 178/91   Pulse 92   Temp 98 F (36.7 C) (Oral)   Resp (!) 22   Ht 5\' 6"  (1.676 m)   Wt 95.3 kg (210 lb)   LMP 01/19/2018   SpO2 98%   BMI 33.89 kg/m   Physical Exam  Constitutional: She appears well-developed and well-nourished. No distress.  HENT:  Head: Normocephalic and atraumatic.  Mouth/Throat: Oropharynx is clear and moist.  Eyes: Conjunctivae are normal. Right eye exhibits no discharge. Left eye exhibits no discharge. No scleral icterus.  Neck: Normal range of motion. Neck supple.  Cardiovascular: Normal rate, regular rhythm, normal heart sounds and intact distal pulses.  Pulmonary/Chest: Effort normal and breath sounds normal. No stridor. No respiratory distress.  Abdominal: She exhibits no distension.  Musculoskeletal: She  exhibits no edema or deformity.  Left BKA.  Patient has reported diffuse tenderness to palpation across chest, abdomen, back, all remaining extremities.  There is no one area that is reportedly more painful than the others.  Neurological: She is alert. She exhibits normal muscle tone.  Skin: Skin is warm and dry. She is not diaphoretic.  There are multiple raised lesions with what appears to be a central keratin plug.  No obvious burrows.   Psychiatric: She has a normal mood and affect. Her behavior is normal.  Nursing note and vitals reviewed.    ED Treatments / Results  Labs (all labs ordered are listed, but only abnormal results are displayed) Labs Reviewed  BASIC METABOLIC PANEL - Abnormal; Notable for the following components:      Result Value   Chloride 99 (*)    Glucose, Bld 215 (*)    BUN 44 (*)    Creatinine, Ser 11.01 (*)    GFR calc non Af Amer 4 (*)    GFR calc Af Amer 4 (*)    Anion gap 16 (*)    All other components within normal limits  CBC - Abnormal; Notable for the following components:   RBC 3.53 (*)    Hemoglobin 9.7 (*)    HCT 32.0 (*)    RDW 18.6 (*)    All other components within normal limits  BRAIN NATRIURETIC PEPTIDE - Abnormal; Notable for the following components:   B Natriuretic Peptide 1,391.7 (*)    All other components within normal limits  CK  HEPATIC FUNCTION PANEL  CREATININE, SERUM  CBC  RENAL FUNCTION PANEL  I-STAT BETA HCG BLOOD, ED (MC, WL, AP ONLY)  CBG MONITORING, ED    EKG None  Radiology Dg Chest 2 View  Result Date: 01/27/2018 CLINICAL DATA:  42 y/o  F; weak all over and shortness of breath. EXAM: CHEST - 2 VIEW COMPARISON:  01/26/2018 chest radiograph FINDINGS: Stable cardiomegaly given projection and technique. Pulmonary venous hypertension and hazy opacification of the lungs. No pleural effusion or pneumothorax. No focal consolidation. Bones are unremarkable. IMPRESSION: Stable cardiomegaly. Pulmonary venous hypertension  and hazy lungs probably representing mild pulmonary edema. Electronically Signed   By: Kristine Garbe M.D.   On: 01/27/2018  18:42    Procedures Procedures (including critical care time)  Medications Ordered in ED Medications  acetaminophen (TYLENOL) tablet 650 mg (650 mg Oral Refused 01/27/18 1956)  heparin injection 5,000 Units (5,000 Units Subcutaneous Given 01/27/18 2146)  acetaminophen (TYLENOL) tablet 650 mg (has no administration in time range)    Or  acetaminophen (TYLENOL) suppository 650 mg (has no administration in time range)  ondansetron (ZOFRAN) tablet 4 mg (has no administration in time range)    Or  ondansetron (ZOFRAN) injection 4 mg (has no administration in time range)  insulin aspart (novoLOG) injection 0-9 Units (has no administration in time range)  amLODipine (NORVASC) tablet 10 mg (10 mg Oral Refused 01/27/18 2146)  atorvastatin (LIPITOR) tablet 80 mg (has no administration in time range)     Initial Impression / Assessment and Plan / ED Course  I have reviewed the triage vital signs and the nursing notes.  Pertinent labs & imaging results that were available during my care of the patient were reviewed by me and considered in my medical decision making (see chart for details).  Clinical Course as of Jan 27 2154  Thu Jan 27, 2018  2008 Spoke with IMTS residents who will come see patient and evaluate her for admission.    [EH]    Clinical Course User Index [EH] Lorin Glass, PA-C   Patient presents today for evaluation of generalized weakness.  She missed dialysis today and only got half of a dialysis session on Tuesday.  Clinically patient does not appear to have peripheral edema, lungs are clear to auscultation bilaterally.  Chest x-ray was obtained concerning for fluid overload.  Patient's labs were obtained, appear grossly consistent with her baseline.  Due to patient's significantly decreased kidney function Lasix was not given in the ER as a  do not expect that it would be helpful.  Hospitalist was consulted.  I do not feel like patient requires emergent dialysis as she is not hypoxic, not tachypneic, her potassium is 4.5.  She does have a slightly elevated anion gap at 16, I suspect that this is uremia, however will let admitting team trend.    This patient was seen as a shared visit with my supervising physician Dr. Vanita Panda who evaluated the patient and agreed with my plan.  Patient hemodynamically stable upon admission.  Final Clinical Impressions(s) / ED Diagnoses   Final diagnoses:  Weakness  Hypervolemia, unspecified hypervolemia type    ED Discharge Orders    None       Ollen Gross 01/27/18 2209    Carmin Muskrat, MD 01/27/18 2253

## 2018-01-27 NOTE — ED Notes (Signed)
Attempted PIV, no success.

## 2018-01-27 NOTE — ED Triage Notes (Signed)
Pt states she has been feeling weak all over today. Denies CP. Pt states she does feel SOB at times. Pt had is dialysis pt, TTS. Last treatment on Tues and only had 1/2 treatment. Pt states her lower back hurts and her bilateral feet are swollen.

## 2018-01-27 NOTE — ED Notes (Signed)
Patient transported to X-ray 

## 2018-01-27 NOTE — ED Notes (Signed)
Per lab, adding on CK and hepatic function panel to previous collection.

## 2018-01-28 ENCOUNTER — Inpatient Hospital Stay (HOSPITAL_COMMUNITY): Payer: Medicare Other

## 2018-01-28 DIAGNOSIS — N186 End stage renal disease: Secondary | ICD-10-CM

## 2018-01-28 DIAGNOSIS — I5023 Acute on chronic systolic (congestive) heart failure: Secondary | ICD-10-CM

## 2018-01-28 DIAGNOSIS — I6930 Unspecified sequelae of cerebral infarction: Secondary | ICD-10-CM

## 2018-01-28 DIAGNOSIS — Z5900 Homelessness unspecified: Secondary | ICD-10-CM

## 2018-01-28 DIAGNOSIS — Z992 Dependence on renal dialysis: Secondary | ICD-10-CM

## 2018-01-28 LAB — GLUCOSE, CAPILLARY
GLUCOSE-CAPILLARY: 144 mg/dL — AB (ref 65–99)
GLUCOSE-CAPILLARY: 130 mg/dL — AB (ref 65–99)
GLUCOSE-CAPILLARY: 112 mg/dL — AB (ref 65–99)
GLUCOSE-CAPILLARY: 121 mg/dL — AB (ref 65–99)
Glucose-Capillary: 87 mg/dL (ref 65–99)

## 2018-01-28 LAB — BASIC METABOLIC PANEL
Anion gap: 15 (ref 5–15)
BUN: 49 mg/dL — ABNORMAL HIGH (ref 6–20)
CALCIUM: 8.8 mg/dL — AB (ref 8.9–10.3)
CO2: 22 mmol/L (ref 22–32)
Chloride: 102 mmol/L (ref 101–111)
Creatinine, Ser: 12.16 mg/dL — ABNORMAL HIGH (ref 0.44–1.00)
GFR, EST AFRICAN AMERICAN: 4 mL/min — AB (ref >60)
GFR, EST NON AFRICAN AMERICAN: 3 mL/min — AB (ref >60)
GLUCOSE: 128 mg/dL — AB (ref 65–99)
POTASSIUM: 4.9 mmol/L (ref 3.5–5.1)
SODIUM: 139 mmol/L (ref 135–145)

## 2018-01-28 LAB — PHOSPHORUS: PHOSPHORUS: 7.3 mg/dL — AB (ref 2.5–4.6)

## 2018-01-28 LAB — CBC
HCT: 29.4 % — ABNORMAL LOW (ref 36.0–46.0)
Hemoglobin: 9.2 g/dL — ABNORMAL LOW (ref 12.0–15.0)
MCH: 27.8 pg (ref 26.0–34.0)
MCHC: 31.3 g/dL (ref 30.0–36.0)
MCV: 88.8 fL (ref 78.0–100.0)
PLATELETS: 175 10*3/uL (ref 150–400)
RBC: 3.31 MIL/uL — AB (ref 3.87–5.11)
RDW: 18.7 % — AB (ref 11.5–15.5)
WBC: 4.8 10*3/uL (ref 4.0–10.5)

## 2018-01-28 LAB — CREATININE, SERUM
CREATININE: 11.36 mg/dL — AB (ref 0.44–1.00)
GFR, EST AFRICAN AMERICAN: 4 mL/min — AB (ref >60)
GFR, EST NON AFRICAN AMERICAN: 4 mL/min — AB (ref >60)

## 2018-01-28 MED ORDER — HYDRALAZINE HCL 20 MG/ML IJ SOLN: 5.0000 mg | INTRAMUSCULAR | Status: DC | PRN

## 2018-01-28 MED ORDER — HYDRALAZINE HCL 20 MG/ML IJ SOLN
5.0000 mg | INTRAMUSCULAR | Status: DC | PRN
  Administered 2018-01-28: 5 mg via INTRAVENOUS
  Filled 2018-01-28 (×2): qty 1

## 2018-01-28 MED ORDER — DARBEPOETIN ALFA 200 MCG/0.4ML IJ SOSY: 200.0000 ug | PREFILLED_SYRINGE | INTRAMUSCULAR | Status: DC

## 2018-01-28 MED ORDER — OXYCODONE HCL 5 MG PO TABS
ORAL_TABLET | ORAL | Status: AC
  Administered 2018-01-28: 10 mg via ORAL
  Filled 2018-01-28: qty 2

## 2018-01-28 MED ORDER — DOXERCALCIFEROL 4 MCG/2ML IV SOLN
5.0000 ug | INTRAVENOUS | Status: DC
  Administered 2018-01-30 – 2018-02-01 (×2): 5 ug via INTRAVENOUS
  Filled 2018-01-28 (×2): qty 4

## 2018-01-28 MED ORDER — OXYCODONE HCL 5 MG PO TABS
10.0000 mg | ORAL_TABLET | Freq: Once | ORAL | Status: AC
  Administered 2018-01-28: 10 mg via ORAL

## 2018-01-28 MED ORDER — ACETAMINOPHEN 325 MG PO TABS
ORAL_TABLET | ORAL | Status: AC
  Administered 2018-01-28: 650 mg via ORAL
  Filled 2018-01-28: qty 2

## 2018-01-28 MED ORDER — SODIUM CHLORIDE 0.9 % IV SOLN
62.5000 mg | INTRAVENOUS | Status: DC
  Filled 2018-01-28: qty 5

## 2018-01-28 NOTE — Consult Note (Addendum)
[facility] Renal Consultation Note  Indication for Consultation:  Management of ESRD/hemodialysis; anemia, hypertension/volume and secondary hyperparathyroidism  HPI: Karen [name] is a 42 y.o. female with ESRD sec DM/HTN on HD TTS ( [facility]) ho noncompliance ( missed HD 5/9/,,5/4,,/4/30 ) signs off tx early when present at her txs,CVA with speech deficit, L BKA, DM type 2,ischermic CM( Dec . 2018= 2d 35-40% EF), and difficult home [place] social environment currently evidently homeless (  but has Adult  children in area). Presents with sob and mild  pulmonary edema on cxr . She also co of extremity pain that is chronic, denies fevers, past Monday told Dr Joelyn Oms missed HD sec "personal reasons".     Past Medical History:  Diagnosis Date  . Anemia   . Anxiety   . Dyspnea    "when I dont go to dialysis"  . Eczema   . ESRD (end stage renal disease) ([place])    Hemo - TTHSAT- [place]  . Exertional shortness of breath    "recently; it's fluid" (02/02/2013)  . GERD (gastroesophageal reflux disease)   . Headache   . History of blood transfusion    "last week" (02/02/2013)  . History of kidney stones    passed  . Hypertension   . Ischemic cardiomyopathy    by echo 2014  . Neuropathy   . Neuropathy   . Osteomyelitis of toe of left foot ([place])    "off and on since 2009; no OR" (02/02/2013)  . Pneumonia 09/2016  . Type II diabetes mellitus ([place]) 1995    Past Surgical History:  Procedure Laterality Date  . AMPUTATION Left 02/06/2013   Procedure: AMPUTATION LEFT GREAT TOE;  Surgeon: Wylene Simmer, MD;  Location: [facility];  Service: Orthopedics;  Laterality: Left;  . AMPUTATION Left 06/24/2013   Procedure: AMPUTATION BELOW KNEE ;  Surgeon: Wylene Simmer, MD;  Location: [facility];  Service: Orthopedics;  Laterality: Left;  . AV FISTULA PLACEMENT Right 02/08/2017   Procedure: CREATION OF RIGHT ARM  BASILIC VEIN TO BRACHIAL ARTERY ARTERIOVENOUS (AV) FISTULA;  Surgeon: Rosetta Posner, MD;   Location: [facility];  Service: Vascular;  Laterality: Right;  . AV FISTULA PLACEMENT Left 09/10/2017   Procedure: INSERTION OF ARTERIOVENOUS (AV) GORE-TEX GRAFT  LEFT UPPER ARM;  Surgeon: Angelia Mould, MD;  Location: [facility];  Service: Vascular;  Laterality: Left;  . BASCILIC VEIN TRANSPOSITION Right 04/14/2017   Procedure: BASCILIC VEIN TRANSPOSITION-RIGHT 2ND STAGE;  Surgeon: Rosetta Posner, MD;  Location: [facility];  Service: Vascular;  Laterality: Right;  . CESAREAN SECTION  10/18/2006  . INSERTION OF DIALYSIS CATHETER        Family History  Problem Relation Age of Onset  . Hypertension Mother   . Diabetes Mother   . Diabetes Father       reports that she quit smoking about 13 years ago. Her smoking use included cigarettes. She has a 30.00 pack-year smoking history. She has never used smokeless tobacco. She reports that she does not drink alcohol or use drugs.   Allergies  Allergen Reactions  . Baclofen Itching  . Penicillins Anaphylaxis, Hives, Rash and Other (See Comments)    PATIENT HAD A PCN REACTION WITH IMMEDIATE RASH, FACIAL/TONGUE/THROAT SWELLING, SOB, OR LIGHTHEADEDNESS WITH HYPOTENSION:  #  #  #  YES  #  #  #   SEVERE RASH INVOLVING MUCUS MEMBRANES or SKIN NECROSIS: #  #  #  YES  #  #  # Has  patient had a PCN reaction that required hospitalization No Has patient had a PCN reaction occurring within the last 10 years: No If all of the above answers are "NO", then may proceed with Cephalosporin use.  09/10/16- tolerated Cefepime   . Morphine And Related Hives and Rash  . Novolog [Insulin Aspart] Other (See Comments)    Cramps/ Gi distress    Prior to Admission medications   Medication Sig Start Date End Date Taking? Authorizing Provider  amLODipine (NORVASC) 10 MG tablet Take 10 mg by mouth daily.    [provider]  amLODipine (NORVASC) 5 MG tablet Take 2 tablets (10 mg total) by mouth daily. Patient not taking: Reported on 01/27/2018 11/13/17   Debbe Odea,  MD  aspirin 325 MG tablet Take 1 tablet (325 mg total) by mouth daily. 09/16/17   Melanee Spry, MD  atorvastatin (LIPITOR) 80 MG tablet TAKE 1 TABLET(80 MG) BY MOUTH DAILY AT 6 PM 09/16/17   Lacroce, Hulen Shouts, MD  calcitRIOL (ROCALTROL) 0.5 MCG capsule Take 3 capsules (1.5 mcg total) by mouth Every Tuesday,Thursday,and Saturday with dialysis. 11/13/17   Debbe Odea, MD  calcium acetate (PHOSLO) 667 MG capsule Take 2 capsules (1,334 mg total) by mouth 3 (three) times daily with meals. 11/13/17   Debbe Odea, MD  cinacalcet (SENSIPAR) 30 MG tablet Take 1 tablet (30 mg total) by mouth Every Tuesday,Thursday,and Saturday with dialysis. 11/13/17   Debbe Odea, MD  Darbepoetin Alfa (ARANESP) 150 MCG/0.3ML SOSY injection Inject 0.3 mLs (150 mcg total) into the vein every Thursday with hemodialysis. Patient not taking: Reported on 11/13/2017 09/16/17   Melanee Spry, MD  insulin glargine (LANTUS) 100 UNIT/ML injection Inject 0.1 mLs (10 Units total) into the skin at bedtime. 09/15/17   Lacroce, Hulen Shouts, MD  insulin lispro (HUMALOG) 100 UNIT/ML injection Inject 2-5 Units into the skin 3 (three) times daily as needed for high blood sugar. Per sliding scale if blood sugar is over 150    [provider]  multivitamin (RENA-VIT) TABS tablet Take 1 tablet by mouth at bedtime. 11/13/17   Debbe Odea, MD  oxyCODONE-acetaminophen (PERCOCET) 10-325 MG tablet Take 1 tablet by mouth 3 (three) times daily.    [provider]     Anti-infectives (From admission, onward)   None      Results for orders placed or performed during the hospital encounter of 01/27/18 (from the past 48 hour(s))  Basic metabolic panel     Status: Abnormal   Collection Time: 01/27/18  1:50 PM  Result Value Ref Range   Sodium 138 135 - 145 mmol/L   Potassium 4.5 3.5 - 5.1 mmol/L   Chloride 99 (L) 101 - 111 mmol/L   CO2 23 22 - 32 mmol/L   Glucose, Bld 215 (H) 65 - 99 mg/dL   BUN 44 (H) 6 - 20 mg/dL    Creatinine, Ser 11.01 (H) 0.44 - 1.00 mg/dL   Calcium 9.0 8.9 - 10.3 mg/dL   GFR calc non Af Amer 4 (L) >60 mL/min   GFR calc Af Amer 4 (L) >60 mL/min    Comment: (NOTE) The eGFR has been calculated using the CKD EPI equation. This calculation has not been validated in all clinical situations. eGFR's persistently <60 mL/min signify possible Chronic Kidney Disease.    Anion gap 16 (H) 5 - 15    Comment: Performed at [facility], [place] [address] [place], [place] [address]  CBC     Status:  Abnormal   Collection Time: 01/27/18  1:50 PM  Result Value Ref Range   WBC 5.5 4.0 - 10.5 K/uL   RBC 3.53 (L) 3.87 - 5.11 MIL/uL   Hemoglobin 9.7 (L) 12.0 - 15.0 g/dL   HCT 32.0 (L) 36.0 - 46.0 %   MCV 90.7 78.0 - 100.0 fL   MCH 27.5 26.0 - 34.0 pg   MCHC 30.3 30.0 - 36.0 g/dL   RDW 18.6 (H) 11.5 - 15.5 %   Platelets 163 150 - 400 K/uL    Comment: Performed at [facility] [address] [place], [place] [address]  Brain natriuretic peptide     Status: Abnormal   Collection Time: 01/27/18  1:50 PM  Result Value Ref Range   B Natriuretic Peptide 1,391.7 (H) 0.0 - 100.0 pg/mL    Comment: Performed at [facility] [address], [place], [place] [address]  CK     Status: None   Collection Time: 01/27/18  1:50 PM  Result Value Ref Range   Total CK 44 38 - 234 U/L    Comment: Performed at [facility], [place] [address], [place], [place] [address]  Hepatic function panel     Status: None   Collection Time: 01/27/18  1:50 PM  Result Value Ref Range   Total Protein 7.6 6.5 - 8.1 g/dL   Albumin 3.5 3.5 - 5.0 g/dL   AST 15 15 - 41 U/L   ALT 17 14 - 54 U/L   Alkaline Phosphatase 68 38 - 126 U/L   Total Bilirubin 0.6 0.3 - 1.2 mg/dL   Bilirubin, Direct 0.1 0.1 - 0.5 mg/dL   Indirect Bilirubin 0.5 0.3 - 0.9 mg/dL    Comment: Performed at [facility] [address], [place], [place] [address]  Creatinine, serum     Status: Abnormal   Collection Time: 01/27/18   1:50 PM  Result Value Ref Range   Creatinine, Ser 11.36 (H) 0.44 - 1.00 mg/dL   GFR calc non Af Amer 4 (L) >60 mL/min   GFR calc Af Amer 4 (L) >60 mL/min    Comment: (NOTE) The eGFR has been calculated using the CKD EPI equation. This calculation has not been validated in all clinical situations. eGFR's persistently <60 mL/min signify possible Chronic Kidney Disease. Performed at [facility], [place] [address], [place], [place] [address]   I-Stat beta hCG blood, ED     Status: None   Collection Time: 01/27/18  2:13 PM  Result Value Ref Range   I-stat hCG, quantitative <5.0 <5 mIU/mL   Comment 3            Comment:   GEST. AGE      CONC.  (mIU/mL)   <=1 WEEK        5 - 50     2 WEEKS       50 - 500     3 WEEKS       100 - 10,000     4 WEEKS     1,000 - 30,000        FEMALE AND NON-PREGNANT FEMALE:     LESS THAN 5 mIU/mL   CBG monitoring, ED     Status: Abnormal   Collection Time: 01/27/18 10:12 PM  Result Value Ref Range   Glucose-Capillary 123 (H) 65 - 99 mg/dL  Glucose, capillary     Status: None   Collection Time: 01/28/18  8:08 AM  Result Value Ref Range   Glucose-Capillary 87 65 - 99 mg/dL  .  ROS: see above   Physical Exam: Vitals:   01/27/18 2322 01/28/18 0609  BP: (!) 158/89 (!) 179/95  Pulse: 89 86  Resp: 17   Temp: 98 F (36.7 C) 98.2 F (36.8 C)  SpO2: 98% 99%     General: alert  AAF NAD, Alert Pleasant  Chronically ill appearing  HEENT: [place], EOMI, MMM, Nonicteric Neck: nio jvd , suppl2 Heart: RRR, no mrg Lungs: bilat rales ,nonlabored breathing  Abdomen: obese, soft , NT, ND Extremities: L BKA (with prosthesis on), R 1+ pedal edema Skin: scattered lower extrem with excoriated lesions papular no dc at sites warm dry  Neuro: alert OX3 moves all extrem. Speech deficits( slurring slightly) but able to understand  Dialysis Access: pos bruit LUA AVG  Dialysis Orders: Center: [facility]  on TTS . EDW 91kg HD Bath 2k,2.25 ca  Time 4  Heparin 6000 bolus then  2000. Access LUA AVG     Mircera 225 mcg IV q 2wks (last on 5/07)   Units IV/HD  Venofer  99m q thurs    Assessment/Plan 1. Volume overload ( sec missed HD/Noncomplaince)  Hd to day and in am for normal schedule  2. ESRD -  TTS hd  3. Hypertension/volume  - elevated  sec bp med and vol ^^, uf today and tomor on hd / Amlodipine 10 mg hs  4. Anemia of ESRD  -  hgbv 9.7 ESA  Weekly next does next thurs/ weekly fe  on hd  5. Metabolic bone disease -  IV hec on hd / binder  ac 6. Nutrition - renal carb mod dioet  7. CVA - speech deficits - Pt, ot, Speech to see in hosp. [facility] need [facility]  In put for home situation    DErnest Haber PA-C [id number]/06/2018, 9:46 AM   I have seen and examined this patient and agree with plan and assessment in the above note with renal recommendations/intervention highlighted.  Pt with h/o noncompliance presents with SOB and volume overload.  Plan for HD today and follow.  Pt with poor medical insight and continues to be nonadherent with outpatient HD JGovernor RooksColadonato,MD 01/28/2018 3:48 PM

## 2018-01-28 NOTE — Progress Notes (Signed)
Patient arrived to unit per bed.  Reviewed treatment plan and this RN agrees.  Report received from bedside RN, Oley Balm.  Consent obtained.  Patient A & O X 4. Lung sounds diminished to ausculation in all fields. Generalized  edema. Cardiac: NSR.  Prepped LUAVG with alcohol and cannulated with two 15 gauge needles.  Pulsation of blood noted.  Flushed access well with saline per protocol.  Connected and secured lines and initiated tx at 1745.  UF goal of 4500 mL and net fluid removal of 4000 mL.  Will continue to monitor.

## 2018-01-28 NOTE — Discharge Summary (Addendum)
[facility] Discharge Summary  Patient name: Karen [name] Medical record number: [id number] Date of birth: [birth year] Age: 42 y.o. Gender: female Date of Admission: 01/27/2018  Date of Discharge: 02/01/18 Admitting Physician: Zenia Resides, MD  Primary Care Provider: Lillard Anes, MD Consultants: Nephrology, PT, OT, SLP  Indication for Hospitalization: Volume overload in setting of missed HD sessions  Discharge Diagnoses/Problem List:  Patient Active Problem List   Diagnosis Date Noted  . Weakness   . Acute on chronic systolic CHF (congestive heart failure) ([place])   . ESRD on dialysis ([place])   . Homelessness   . Late effect of cerebrovascular accident (CVA)   . Dyspnea 01/27/2018  . Hypertension, uncontrolled 11/11/2017  . Fluid overload 11/11/2017  . Abdominal pain   . Dysarthria 09/14/2017  . Lacunar infarct, acute ([place]) 09/14/2017  . Obesity 09/09/2017  . Chest pain 09/07/2017  . Fluid overload, unspecified 10/03/2016  . Hyperkalemia 10/03/2016  . Hypertension with fluid overload 10/03/2016  . Anemia in other chronic diseases classified elsewhere 09/08/2016  . High anion gap metabolic acidosis 13/24/4010  . Right lower lobe pneumonia ([place]) 09/08/2016  . Uncontrolled type 2 diabetes mellitus with chronic kidney disease on chronic dialysis, with long-term current use of insulin ([place]) 01/31/2016  . Noncompliance with medications   . End-stage renal disease on hemodialysis ([place])   . Hx of BKA ([place]) 07/03/2013  . S/P BKA (below knee amputation) unilateral ([place]) 06/30/2013  . Physical deconditioning 06/30/2013  . Cardiomyopathy, ischemic - EF 45-50% with inf WMA by 2D 02/05/13 02/06/2013  . Essential hypertension 02/05/2013  . PAD (peripheral artery disease) ([place]) 02/05/2013  . Diabetic neuropathy ([place])   . Osteomyelitis of left great toe - S/P amputation 02/06/13 04/21/2011   Disposition: SNF  Discharge Condition: Improved  Discharge  Exam:  Gen: NAD, resting comfortably CV:  RRR with no murmurs appreciated Pulm: NWOB, bronchovesicular breath sounds, no crackles GI: Normal bowel sounds present. Soft, Nontender, Nondistended. MSK: R BKA Skin: multiple calloused nodules on extremities Neuro: A&Ox3 Per Dr. Grandville Silos on day of discharge  Brief Hospital Course:   ESRD  Volume Overload Karen [name] is a 42 y.o. female with a past medical history significant for ESRD on dialysis (T, Th, S), HTN, stroke,T2DM, PAD s/p BKA, ischemic cardiomyopathy  presenting with shortness of breath and lower extremity pain and emesis. Patient reports she has been been unable to go to dialysis for the past few days prior to admission. On admission patient had elevated BP 190/120. Pt also tachypnic 26, however, did not require any oxygen to maintain saturation. Labs on admission significant for Creatinine was 11.3 BUN of 44, BNP was 1391. Nephrology was consulted and started patient on HD. Patient BP and lab abnormalities corrected with this.   Prior CVA with persistent dysarthria  Mild Cognative Impairment H/o stroke in 2018 with persistent speech deficits. Patient was evaluated by SLP by barium swallow and shown be at aspiration risk. Patient was placed on Dysphagia 3 diet. Pt also had Ramey 23/30 consistent with mild cognative impairment.   Hypertension - persistently hypertensive dispite HD. SBP 140-160. Is on amlodipine, metoprolol.   Chronic Homelessness - SW consulted, able to fine SNF for short term placement. Chronic placement may be an issue.   Issues for Follow Up:  1. Anemia of Chronic Disease. Stable on discharge at baseline 10-11. Consider further workup/management per nephrology.  2. Consider addressing HTN further in outpatient. May benefit from hydralazine.  3.  Continue HD TTS  Significant Procedures: None  Significant Labs and Imaging:  Recent Labs  Lab 01/30/18 0532 01/31/18 0428 02/01/18 0637  WBC 5.3 6.2 6.1  HGB  10.7* 9.3* 10.1*  HCT 36.0 31.7* 33.1*  PLT 152 160 178   Recent Labs  Lab 01/27/18 1350 01/28/18 1553 01/29/18 0854 01/30/18 0532 01/31/18 0428 02/01/18 0516  NA 138 139 137 140 139 135  K 4.5 4.9 4.4 3.8 4.3 5.3*  CL 99* 102 97* 97* 97* 99*  CO2 23 22 27 29 28  20*  GLUCOSE 215* 128* 115* 144* 165* 139*  BUN 44* 49* 26* 19 32* 40*  CREATININE 11.36*  11.01* 12.16* 7.69* 5.82* 7.58* 8.48*  CALCIUM 9.0 8.8* 8.4* 9.2 9.2 9.2  PHOS  --  7.3*  --   --   --   --   ALKPHOS 68  --   --   --   --   --   AST 15  --   --   --   --   --   ALT 17  --   --   --   --   --   ALBUMIN 3.5  --   --   --   --   --     Results/Tests Pending at Time of Discharge: none  Discharge Medications:  Allergies as of 02/01/2018      Reactions   Baclofen Itching   Penicillins Anaphylaxis, Hives, Rash, Other (See Comments)   PATIENT HAD A PCN REACTION WITH IMMEDIATE RASH, FACIAL/TONGUE/THROAT SWELLING, SOB, OR LIGHTHEADEDNESS WITH HYPOTENSION:  #  #  #  YES  #  #  #   SEVERE RASH INVOLVING MUCUS MEMBRANES or SKIN NECROSIS: #  #  #  YES  #  #  # Has patient had a PCN reaction that required hospitalization No Has patient had a PCN reaction occurring within the last 10 years: No If all of the above answers are "NO", then may proceed with Cephalosporin use. 09/10/16- tolerated Cefepime   Morphine And Related Hives, Rash   Novolog [insulin Aspart] Other (See Comments)   Cramps/ Gi distress      Medication List    STOP taking these medications   insulin glargine 100 UNIT/ML injection Commonly known as:  LANTUS   insulin lispro 100 UNIT/ML injection Commonly known as:  HUMALOG   oxyCODONE-acetaminophen 10-325 MG tablet Commonly known as:  PERCOCET     TAKE these medications   amLODipine 10 MG tablet Commonly known as:  NORVASC Take 10 mg by mouth daily. What changed:  Another medication with the same name was removed. Continue taking this medication, and follow the directions you see here.    aspirin 325 MG tablet Take 1 tablet (325 mg total) by mouth daily.   atorvastatin 80 MG tablet Commonly known as:  LIPITOR TAKE 1 TABLET(80 MG) BY MOUTH DAILY AT 6 PM   calcitRIOL 0.5 MCG capsule Commonly known as:  ROCALTROL Take 3 capsules (1.5 mcg total) by mouth Every Tuesday,Thursday,and Saturday with dialysis.   calcium acetate 667 MG capsule Commonly known as:  PHOSLO Take 2 capsules (1,334 mg total) by mouth 3 (three) times daily with meals.   cinacalcet 30 MG tablet Commonly known as:  SENSIPAR Take 1 tablet (30 mg total) by mouth Every Tuesday,Thursday,and Saturday with dialysis.   Darbepoetin Alfa 200 MCG/0.4ML Sosy injection Commonly known as:  ARANESP Inject 0.4 mLs (200 mcg total) into the vein every Thursday with  hemodialysis. Start taking on:  02/03/2018 What changed:    medication strength  how much to take   ferric gluconate 62.5 mg in sodium chloride 0.9 % 100 mL Inject 62.5 mg into the vein every Thursday with hemodialysis. Start taking on:  02/03/2018   insulin regular 100 units/mL injection Commonly known as:  NOVOLIN R,HUMULIN R Inject 0-0.09 mLs (0-9 Units total) into the skin 3 (three) times daily with meals. CBG < 70:0. Check CBG CBG 70 - 120: 0 units  CBG 121 - 150:1 unit  CBG 151 - 200:2 units  CBG 201 - 250:3 units  CBG 251 - 300:5 units  CBG 301 - 350:7 units  CBG 351 - 400:9 units  CBG > 400:call MD   metoprolol succinate 25 MG 24 hr tablet Commonly known as:  TOPROL-XL Take 0.5 tablets (12.5 mg total) by mouth daily. Start taking on:  02/02/2018   multivitamin Tabs tablet Take 1 tablet by mouth at bedtime.   polyethylene glycol packet Commonly known as:  MIRALAX / GLYCOLAX Take 17 g by mouth daily. Start taking on:  02/02/2018       Discharge Instructions: Please refer to Patient Instructions section of EMR for full details.  Patient was counseled important signs and symptoms that should prompt return to medical care, changes  in medications, dietary instructions, activity restrictions, and follow up appointments.   Follow-Up Appointments:  Contact information for follow-up providers    Lillard Anes, MD. Schedule an appointment as soon as possible for a visit in 1 week(s).   Specialty:  Family Medicine Contact information: [address] [place] [address] [phone]            Contact information for after-discharge care    Destination    [facility] .   Service:  Skilled Nursing Contact information: [address] Pricilla Handler [place] [address] [place], [place], [place] 02/01/2018, 3:22 PM PGY-1, [place] Medicine\

## 2018-01-28 NOTE — Progress Notes (Addendum)
Modified Barium Swallow Progress Note  Patient Details  Name: Karen [name] MRN: [id number] Date of Birth: 1976-01-16  Today's Date: 01/28/2018  Modified Barium Swallow completed.  Full report located under Chart Review in the Imaging Section.  Brief recommendations include the following:  Clinical Impression  Pt presents with a mild dysphagia (as well as moderate dysarthria, likely attributable to the chronic ischemic demyelination of pontine fibers noted on 09/11/17 MRI) marked by mild delays in oral preparation and impaired laryngeal-vestibular closure, leading to entry of thin liquids into the larynx prior to the swallow.  Thin liquids penetrated to the level of the vocal folds, which elicited a cough response, but they were not aspirated.  Postural adjustments (head rotations, chin tuck) did not prevent penetration.  Results reviewed with pt after study.  Recommend beginning a dysphagia 3 diet; allow thin liquids - pt should take controlled, small sips to limit airway entry.   Pt would benefit from speech/language evaluation given her spastic dysarthria and her legitimate concerns about its interference with communication.    Swallow Evaluation Recommendations       SLP Diet Recommendations: Dysphagia 3 (Mech soft) solids;Thin liquid - small, controlled sips   Liquid Administration via: Cup;Straw   Medication Administration: Whole meds with puree   Supervision: Patient able to self feed   Compensations: Small sips/bites       Oral Care Recommendations: Oral care BID       Jaelen Gellerman L. Tivis Ringer, [place] CCC/SLP Pager [phone]  Juan Quam Laurice 01/28/2018,2:14 PM

## 2018-01-28 NOTE — Progress Notes (Signed)
Family Medicine Teaching Service Daily Progress Note Intern Pager: [phone]  Patient name: Karen [name] Medical record number: [id number] Date of birth: 31-Mar-1976 Age: 42 y.o. Gender: female  Primary Care Provider: Patient, No Pcp Per Consultants: Nephrology Code Status: Full  Pt Overview and Major Events to Date:  05/09 Admitted to FPTS  Assessment and Plan: Volume overload from missed HD sessions  ESRD  Beginning to improve after HD sessions, however terminated session AMA yesterday. CHF may be contributing as well. Missed HD sessions due to non-compliance and poor social situation (see below).  - Nephrology consulted, appreciate recommendations - HD today per nephro recs - Daily weights - Strict I/Os  Homelessness/poor social situation Currently homeless. Concern for underlying mental illness contributing to overall poor health and non-compliance. PT/OT recommending SNF.  - CSW consulted, appreciate assistance  Hypertension, uncontrolled Hypertensive overnight, with BP 139-173/68-87 overnight. Likely due to volume overload and nonadherence.  - Appreciate nephrology recommendations - Hydralazine as needed - Amlodipine 10 mg daily  CVA with dysarthria Concern for aspiration risk. SLP eval for barrium swallo - cont home asa, statin  Left BKA Uses prosthesis, no pressure ulcers on stump.  - PT/OT recommending SNF  Disseminated extremity skin nodules Reportedly present on upper and lower extremities since 2004. 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  - Continue to monitor.  - May need biopsy or further treatment in outpatient setting  FEN/GI: Dysphagia 3 diet DVT PPx: Heparin  Disposition: SNF vs home pending medical improvement  Subjective:  No complaints this AM. Ended HD early last night AMA due to leg cramps. Says she is willing to try HD again today.   Objective: Temp:  [98 F (36.7 C)-98.4 F (36.9 C)] 98.4 F (36.9 C)  (05/11 0631) Pulse Rate:  [87-96] 87 (05/11 0631) Resp:  [18-26] 26 (05/10 2100) BP: (139-189)/(68-97) 180/97 (05/11 0631) SpO2:  [98 %-100 %] 98 % (05/11 0631) Weight:  [207 lb 7.3 oz (94.1 kg)-212 lb 15.4 oz (96.6 kg)] 207 lb 7.3 oz (94.1 kg) (05/11 0631) Physical Exam: FOY:DXAJO in bed in NAD CV: RRR, no murmurs appreciated Pulm: bilateral rhales, Normal WOB on RA GI: soft, NTND, +BS MSK: R BKA Skin: multiple calloused nodules on extremities upper and lower Neuro: A&Ox3 Psych: flat affect  Laboratory: Recent Labs  Lab 01/27/18 1350 01/28/18 1553  WBC 5.5 4.8  HGB 9.7* 9.2*  HCT 32.0* 29.4*  PLT 163 175   Recent Labs  Lab 01/27/18 1350 01/28/18 1553  NA 138 139  K 4.5 4.9  CL 99* 102  CO2 23 22  BUN 44* 49*  CREATININE 11.36*  11.01* 12.16*  CALCIUM 9.0 8.8*  PROT 7.6  --   BILITOT 0.6  --   ALKPHOS 68  --   ALT 17  --   AST 15  --   GLUCOSE 215* 128*   Imaging/Diagnostic Tests: Dg Chest 2 View  Result Date: 01/27/2018 CLINICAL DATA:  42 y/o  F; weak all over and shortness of breath. EXAM: CHEST - 2 VIEW COMPARISON:  01/26/2018 chest radiograph FINDINGS: Stable cardiomegaly given projection and technique. Pulmonary venous hypertension and hazy opacification of the lungs. No pleural effusion or pneumothorax. No focal consolidation. Bones are unremarkable. IMPRESSION: Stable cardiomegaly. Pulmonary venous hypertension and hazy lungs probably representing mild pulmonary edema. Electronically Signed   By: Kristine Garbe M.D.   On: 01/27/2018 18:42    Verner Mould, MD 01/29/2018, 6:55 AM PGY-3, [place] Intern  pager: [phone], text pages welcome

## 2018-01-28 NOTE — Progress Notes (Signed)
Patient arrived to floor from ED. Report received from [place], [place]. Patient stable, alert and oriented. Patient has prothesis and her own wheel chair at bedside.

## 2018-01-28 NOTE — Evaluation (Signed)
Clinical/Bedside Swallow Evaluation Patient Details  Name: Karen [name] MRN: [id number] Date of Birth: 09-Jan-1976  Today's Date: 01/28/2018 Time: SLP Start Time (ACUTE ONLY): 1010 SLP Stop Time (ACUTE ONLY): 1040 SLP Time Calculation (min) (ACUTE ONLY): 30 min  Past Medical History:  Past Medical History:  Diagnosis Date  . Anemia   . Anxiety   . Dyspnea    "when I dont go to dialysis"  . Eczema   . ESRD (end stage renal disease) ([place])    [place] - [facility]SAT- [place]  . Exertional shortness of breath    "recently; it's fluid" (02/02/2013)  . GERD (gastroesophageal reflux disease)   . Headache   . History of blood transfusion    "last week" (02/02/2013)  . History of kidney stones    passed  . Hypertension   . Ischemic cardiomyopathy    by echo 2014  . Neuropathy   . Neuropathy   . Osteomyelitis of toe of left foot ([place])    "off and on since 2009; no OR" (02/02/2013)  . Pneumonia 09/2016  . Type II diabetes mellitus ([place]) 1995   Past Surgical History:  Past Surgical History:  Procedure Laterality Date  . AMPUTATION Left 02/06/2013   Procedure: AMPUTATION LEFT GREAT TOE;  Surgeon: Wylene Simmer, MD;  Location: [facility];  Service: Orthopedics;  Laterality: Left;  . AMPUTATION Left 06/24/2013   Procedure: AMPUTATION BELOW KNEE ;  Surgeon: Wylene Simmer, MD;  Location: [facility];  Service: Orthopedics;  Laterality: Left;  . AV FISTULA PLACEMENT Right 02/08/2017   Procedure: CREATION OF RIGHT ARM  BASILIC VEIN TO BRACHIAL ARTERY ARTERIOVENOUS (AV) FISTULA;  Surgeon: Rosetta Posner, MD;  Location: [facility];  Service: Vascular;  Laterality: Right;  . AV FISTULA PLACEMENT Left 09/10/2017   Procedure: INSERTION OF ARTERIOVENOUS (AV) GORE-TEX GRAFT  LEFT UPPER ARM;  Surgeon: Angelia Mould, MD;  Location: [facility];  Service: Vascular;  Laterality: Left;  . BASCILIC VEIN TRANSPOSITION Right 04/14/2017   Procedure: BASCILIC VEIN TRANSPOSITION-RIGHT 2ND STAGE;  Surgeon: Rosetta Posner, MD;  Location: [facility];  Service: Vascular;  Laterality: Right;  . CESAREAN SECTION  10/18/2006  . INSERTION OF DIALYSIS CATHETER     HPI:  42 y.o. female, currently homeless, with a past medical history significant for ESRD on dialysis (T, Th, S), HTN, stroke,T2DM, PAD s/p left BKA, ischemic cardiomyopathy presenting with shortness of breath, cough, extremity pain and found to have volume overload in the setting of multiple missed HD sessions. Pt followed by SLP services during Dec 2018 admission for CVA (infarct left pons, right parietal lobe, right corpus callosum/cingulate gyrus) - was found to have a mild dysphagia/dysarthria.  She reports increased episodes of choking with meals PTA.    Assessment / Plan / Recommendation Clinical Impression  Pt presents with concerns for dysphagia - she reports choking/coughing with POs since Dec CVA.  CN exam reveals harsh phonation, hypernasal resonance, subtle tongue deviation to right upon extension with fasciculations and signs of atrophy.  Voice and speech characteristics indicate a spastic dysarthria subtype.  Pt with time-limited exhalations, potential difficulty coordinating respirations with swallow; there is consistent throat-clearing noted throughout assessment, with purees and thin liquids.  Recommend proceeding with MBS today; allow POs from floor stock pending results of exam.  SLP Visit Diagnosis: Dysphagia, unspecified (R13.10)    Aspiration Risk       Diet Recommendation    Food from floor stock/thin liquids pending today's MBS  Other  Recommendations     Follow up Recommendations        Frequency and Duration            Prognosis        Swallow Study   General HPI: 42 y.o. female, currently homeless, with a past medical history significant for ESRD on dialysis (T, Th, S), HTN, stroke,T2DM, PAD s/p left BKA, ischemic cardiomyopathy presenting with shortness of breath, cough, extremity pain and found to have volume overload in the setting of  multiple missed HD sessions. Pt followed by SLP services during Dec 2018 admission for CVA (infarct left pons, right parietal lobe, right corpus callosum/cingulate gyrus) - was found to have a mild dysphagia/dysarthria.  She reports increased episodes of choking with meals PTA.  Type of Study: Bedside Swallow Evaluation Previous Swallow Assessment: see HPI Diet Prior to this Study: Thin liquids;Dysphagia 3 (soft) Temperature Spikes Noted: Yes Respiratory Status: Room air History of Recent Intubation: No Behavior/Cognition: Alert;Cooperative Oral Cavity Assessment: Within Functional Limits Oral Care Completed by SLP: No Oral Cavity - Dentition: Missing dentition Vision: Functional for self-feeding Self-Feeding Abilities: Able to feed self Patient Positioning: Upright in chair Baseline Vocal Quality: (harsh) Volitional Cough: Weak Volitional Swallow: Able to elicit    Oral/Motor/Sensory Function Overall Oral Motor/Sensory Function: Other (comment)(subtle right tongue deviation; fasciculations of tongue)   Ice Chips Ice chips: Within functional limits   Thin Liquid Thin Liquid: Impaired Presentation: Self Fed;Cup Pharyngeal  Phase Impairments: Throat Clearing - Delayed    Nectar Thick Nectar Thick Liquid: Not tested   Honey Thick Honey Thick Liquid: Not tested   Puree Puree: Impaired Pharyngeal Phase Impairments: Throat Clearing - Delayed;Multiple swallows   Solid   GO   Solid: Within functional limits        Sloka Volante Laurice 01/28/2018,10:50 AM

## 2018-01-28 NOTE — Progress Notes (Signed)
Family Medicine Teaching Service Daily Progress Note Intern Pager: [phone]  Patient name: Karen [name] Medical record number: [id number] Date of birth: 07/25/1976 Age: 42 y.o. Gender: female  Primary Care Provider: Patient, No Pcp Per Consultants: Nephrology Code Status: Full  Pt Overview and Major Events to Date:  01/27/2018 - admission date   Assessment and Plan: Volume overload from missed HD sessions  ESRD  Pt says SOB, improved this AM. Pt is stable on RA. Nephrology consulted to arrange dialysis. CHF may be contributing. Poor home situation, will need assistance w/ housing and transpiration to prevent further determination and future hospitalizations. Concern for underlying mental illness contributing to overall poor health.  - Nephrology consulted, appreciate recommendations - SW consulted - daily weights - strict I/Os - follow renal panel  Hypertension, uncontrolled BP 150-190/90-110. Likely due to volume overload and nonadherence. Likely will improve with HD - appreciat nephrology recommendations --Hydralazine as needed --amlodipine 10 mg daily  CVA with dysarthria Concern for aspiration risk. SLP eval for barrium swallo - cont home asa, statin  Left BKA Uses prosthesis, no pressure ulcers on stump - PT/OT  Recommend SNF  Disseminated extremity skin nodules Pt reports having lesion on upper and lower extremities since 2004. They are painful and itchy. Most appear like verruca-like. 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 Pt HIV 2018 non-reactive. Will continue to monitor.  - may need biopsy or further treatment in outpatient setting  FEN/GI: NPO, awaiting eval,  DVT PPx: Heparin  Disposition: Inpatient to receive dialysis  Subjective:  Pt upset that she cannot eat. Saying she will leave if not provided something. She does calm down when offered ice chips. Endorsing SOB.   Objective: Temp:  [97.7 F (36.5 C)-98.2 F (36.8  C)] 98.2 F (36.8 C) (05/10 0609) Pulse Rate:  [86-100] 86 (05/10 0609) Resp:  [17-26] 17 (05/09 2322) BP: (146-199)/(62-122) 179/95 (05/10 0609) SpO2:  [96 %-100 %] 99 % (05/10 0609) Weight:  [210 lb (95.3 kg)-212 lb 11.9 oz (96.5 kg)] 212 lb 11.9 oz (96.5 kg) (05/09 2322) Physical Exam: [id number] ill appearing, appears older than stated age, speech impediment CV: RRR with no murmurs appreciated Pulm: NWOB, basilar crackles GI: Normal bowel sounds present. Soft, Nontender, Nondistended. MSK: R BKA, no pressru ulcer Skin: multiple calloused nodules on extremities upper and lower, none on truck or back,some ulcers on lower extremities consistent w/ PAD, no sign of active infection Neuro: grossly normal, moves all extremities Psych: aggrivated  Laboratory: Recent Labs  Lab 01/27/18 1350  WBC 5.5  HGB 9.7*  HCT 32.0*  PLT 163   Recent Labs  Lab 01/27/18 1350  NA 138  K 4.5  CL 99*  CO2 23  BUN 44*  CREATININE 11.36*  11.01*  CALCIUM 9.0  PROT 7.6  BILITOT 0.6  ALKPHOS 68  ALT 17  AST 15  GLUCOSE 215*   Imaging/Diagnostic Tests: Dg Chest 2 View  Result Date: 01/27/2018 CLINICAL DATA:  42 y/o  F; weak all over and shortness of breath. EXAM: CHEST - 2 VIEW COMPARISON:  01/26/2018 chest radiograph FINDINGS: Stable cardiomegaly given projection and technique. Pulmonary venous hypertension and hazy opacification of the lungs. No pleural effusion or pneumothorax. No focal consolidation. Bones are unremarkable. IMPRESSION: Stable cardiomegaly. Pulmonary venous hypertension and hazy lungs probably representing mild pulmonary edema. Electronically Signed   By: Kristine Garbe M.D.   On: 01/27/2018 18:42    Bonnita Hollow, MD 01/28/2018, 7:35 AM PGY-1, [place]  [place] Intern pager: [phone], text pages welcome

## 2018-01-28 NOTE — Evaluation (Signed)
Physical Therapy Evaluation Patient Details Name: Karen [name] MRN: [id number] DOB: 07/31/1976 Today's Date: 01/28/2018   History of Present Illness  Pt is a 42 y.o. female admitted 01/28/18 with SOB, found to have volume overload in setting of multiple missed HD sessions. PMH includes ESRD (HD TTS), HTN, CVA (2018), PAD s/p L BKA (2014), DM.     Clinical Impression  Pt presents with an overall decrease in functional mobility secondary to above. PTA, pt staying with friends temporarily, but does not have home to return to; this makes transportation via [employer] to appointments difficult. Relies on manual wheelchair for majority of mobility, but w/c currently in poor condition; also reports ill-fitting LLE prosthetic. Has home aide assist 7x/wk with mobility and ADLs. Today, required min guard-minA for transfers and short amb distance with RW. Easily fatigued with decreased activity tolerance; at increased risk for falls. Pt would benefit from continued acute PT services to maximize functional mobility and independence prior to d/c with SNF-level therapies.     Follow Up Recommendations SNF;Supervision for mobility/OOB    Equipment Recommendations  3in1 (PT);Wheelchair (measurements PT);Wheelchair cushion (measurements PT)(pt current lightweight manual w/c in poor condition)    Recommendations for Other Services Speech consult;OT consult     Precautions / Restrictions Precautions Precautions: Fall Precaution Comments: L BKA (has prosthetic) Restrictions Weight Bearing Restrictions: No      Mobility  Bed Mobility               General bed mobility comments: Received sitting in recliner  Transfers Overall transfer level: Needs assistance Equipment used: Rolling walker (2 wheeled) Transfers: Sit to/from Stand Sit to Stand: Min assist;Min guard         General transfer comment: Performed 4x sit<>stand with RW, initially requiring minA to assist trunk elevation, progressing to  min guard; cues to scoot to edge of seat and for correct hand placement. Poor eccentric control into sitting  Ambulation/Gait Ambulation/Gait assistance: Min assist Ambulation Distance (Feet): 5 Feet Assistive device: Rolling walker (2 wheeled) Gait Pattern/deviations: Step-to pattern;Decreased weight shift to left;Antalgic Gait velocity: Decreased Gait velocity interpretation: <1.31 ft/sec, indicative of household ambulator General Gait Details: Amb short distance forwards/backwards with RW and minA; cues to maintain proximity to RW and for complete weight shift to offload LLE. Pt c/o "loose" prosthetic fit making this difficulty; easily fatigued  Stairs            Wheelchair Mobility    Modified Rankin (Stroke Patients Only)       Balance Overall balance assessment: Needs assistance   Sitting balance-Leahy Scale: Good Sitting balance - Comments: Indep to don LLE prosthetic while sitting     Standing balance-Leahy Scale: Poor Standing balance comment: Reliant on UE support                             Pertinent Vitals/Pain Pain Assessment: Faces Faces Pain Scale: Hurts a little bit Pain Location: BLE (R>L) Pain Descriptors / Indicators: Sore Pain Intervention(s): Monitored during session    Home Living Family/patient expects to be discharged to:: Unsure                 Additional Comments: Pt currently without permanent home, reports moving "from place to place" with friends. Does not have permanent option to stay with friends/family. Per chart, has 47 y.o. son    Prior Function Level of Independence: Needs assistance   Gait / Transfers Assistance Needed: Relies  on manual w/c for majority of mobility; stand pivot transfer to RW when she has it. Difficulty walking with RW  ADL's / Homemaking Assistance Needed: Aide 7x/wk assists with ADLs (bathing, dressing) and household tasks. Pt does not drive; uses [employer], but had difficulty lately due to no  permanent address for pickup  Comments: Has had w/c since 2014, arm rests broken     Hand Dominance        Extremity/Trunk Assessment   Upper Extremity Assessment Upper Extremity Assessment: Overall WFL for tasks assessed(c/o difficulty gripping wheelchair)    Lower Extremity Assessment Lower Extremity Assessment: LLE deficits/detail;RLE deficits/detail RLE Deficits / Details: RLE strength grossly 4/5 LLE Deficits / Details: s/p L BKA (has prosthetic), reports soreness at tibial end and patella (callouses noted); residual limb strength and ROM WFL    Cervical / Trunk Assessment Cervical / Trunk Assessment: Kyphotic  Communication   Communication: Expressive difficulties  Cognition Arousal/Alertness: Awake/alert Behavior During Therapy: WFL for tasks assessed/performed Overall Cognitive Status: Impaired/Different from baseline Area of Impairment: Attention                   Current Attention Level: Selective           General Comments: Difficulty with selective attention while attempting to stand with MD talking to pt      General Comments      Exercises General Exercises - Lower Extremity Quad Sets: AROM;Left;5 reps;Seated Long Arc Quad: AROM;Both;10 reps;Seated   Assessment/Plan    PT Assessment Patient needs continued PT services  PT Problem List Decreased strength;Decreased activity tolerance;Decreased balance;Decreased mobility;Decreased knowledge of use of DME       PT Treatment Interventions DME instruction;Gait training;Stair training;Functional mobility training;Therapeutic activities;Therapeutic exercise;Balance training;Patient/family education    PT Goals (Current goals can be found in the Care Plan section)  Acute Rehab PT Goals Patient Stated Goal: Get stronger at rehab; find a permanent place to stay PT Goal Formulation: With patient Time For Goal Achievement: 02/11/18 Potential to Achieve Goals: Fair    Frequency Min 2X/week    Barriers to discharge Inaccessible home environment;Decreased caregiver support      Co-evaluation               AM-PAC PT "6 Clicks" Daily Activity  Outcome Measure Difficulty turning over in bed (including adjusting bedclothes, sheets and blankets)?: A Little Difficulty moving from lying on back to sitting on the side of the bed? : A Little Difficulty sitting down on and standing up from a chair with arms (e.g., wheelchair, bedside commode, etc,.)?: Unable Help needed moving to and from a bed to chair (including a wheelchair)?: A Little Help needed walking in hospital room?: A Little Help needed climbing 3-5 steps with a railing? : A Lot 6 Click Score: 15    End of Session Equipment Utilized During Treatment: Gait belt Activity Tolerance: Patient tolerated treatment well;Patient limited by fatigue Patient left: in chair;with call bell/phone within reach Nurse Communication: Mobility status PT Visit Diagnosis: Other abnormalities of gait and mobility (R26.89)    Time: 8756-4332 PT Time Calculation (min) (ACUTE ONLY): 22 min   Charges:   PT Evaluation $PT Eval Moderate Complexity: 1 Mod     PT G Codes:       Mabeline Caras, PT, DPT Acute Rehab Services  Pager: [place] 01/28/2018, 10:01 AM

## 2018-01-28 NOTE — Evaluation (Signed)
Occupational Therapy Evaluation Patient Details Name: Karen [name] MRN: [id number] DOB: 01/30/1976 Today's Date: 01/28/2018    History of Present Illness Pt is a 42 y.o. female admitted 01/28/18 with SOB, found to have volume overload in setting of multiple missed HD sessions. PMH includes ESRD (HD TTS), HTN, CVA (2018), PAD s/p L BKA (2014), DM.    Clinical Impression   PT admitted with volume overload with SOB. Pt currently with functional limitiations due to the deficits listed below (see OT problem list). Pt currently requires max (A) from standard toilet height to transfer and standpivot only transfer with lose fitting prosthetic this session.  Pt will benefit from skilled OT to increase their independence and safety with adls and balance to allow discharge SNf.     Follow Up Recommendations  SNF    Equipment Recommendations  Other (comment)(defer to SNF)    Recommendations for Other Services       Precautions / Restrictions Precautions Precautions: Fall Precaution Comments: L BKA (has prosthetic)      Mobility Bed Mobility Overal bed mobility: Needs Assistance Bed Mobility: Rolling;Supine to Sit Rolling: Min guard   Supine to sit: Mod assist     General bed mobility comments: requires (A) to elevate trunk from flat bed surface  Transfers Overall transfer level: Needs assistance Equipment used: Rolling walker (2 wheeled) Transfers: Sit to/from Stand Sit to Stand: Mod assist         General transfer comment: pt requires mod (A) from elevated surface and max (A) from w/c height this afternoon with fatigue    Balance Overall balance assessment: Needs assistance           Standing balance-Leahy Scale: Poor Standing balance comment: Reliant on UE support                           ADL either performed or assessed with clinical judgement   ADL Overall ADL's : Needs assistance/impaired Eating/Feeding: Modified independent   Grooming: Modified  independent   Upper Body Bathing: Supervision/ safety   Lower Body Bathing: Minimal assistance Lower Body Bathing Details (indicate cue type and reason): requires (A) for up and down from surface elevated. pt requires incr (A) with low surface     Lower Body Dressing: Minimal assistance Lower Body Dressing Details (indicate cue type and reason): (A) to stand to click in prosthetic Toilet Transfer: Moderate assistance             General ADL Comments: pt requires stand pivot only with uncontrolled descend     Vision         Perception     Praxis      Pertinent Vitals/Pain Pain Assessment: No/denies pain     Hand Dominance Left   Extremity/Trunk Assessment Upper Extremity Assessment Upper Extremity Assessment: Overall WFL for tasks assessed   Lower Extremity Assessment Lower Extremity Assessment: Defer to PT evaluation LLE Deficits / Details: L prosthetic loose fitting .    Cervical / Trunk Assessment Cervical / Trunk Assessment: Kyphotic   Communication Communication Communication: Expressive difficulties   Cognition Arousal/Alertness: Awake/alert Behavior During Therapy: WFL for tasks assessed/performed Overall Cognitive Status: Impaired/Different from baseline                                     General Comments       Exercises  Shoulder Instructions      Home Living Family/patient expects to be discharged to:: Unsure                                 Additional Comments: Pt currently without permanent home, reports moving "from place to place" with friends. Does not have permanent option to stay with friends/family. Per chart, has 58 y.o. son      Prior Functioning/Environment Level of Independence: Needs assistance  Gait / Transfers Assistance Needed: Relies on manual w/c for majority of mobility; stand pivot transfer to RW when she has it. Difficulty walking with RW ADL's / Homemaking Assistance Needed: Aide  7x/wk assists (3 hrs per day) with ADLs (bathing, dressing) and household tasks. Pt does not drive; uses [employer], but had difficulty lately due to no permanent address for pickup   Comments: Has had w/c since 2014, arm rests broken        OT Problem List: Decreased activity tolerance;Decreased knowledge of precautions;Decreased knowledge of use of DME or AE;Decreased safety awareness;Decreased strength;Impaired balance (sitting and/or standing)      OT Treatment/Interventions: Self-care/ADL training;Therapeutic exercise;DME and/or AE instruction;Therapeutic activities;Patient/family education;Balance training    OT Goals(Current goals can be found in the care plan section) Acute Rehab OT Goals Patient Stated Goal: to go to rehab OT Goal Formulation: With patient Time For Goal Achievement: 02/11/18 Potential to Achieve Goals: Good  OT Frequency: Min 2X/week   Barriers to D/C: Decreased caregiver support          Co-evaluation              AM-PAC PT "6 Clicks" Daily Activity     Outcome Measure Help from another person eating meals?: None Help from another person taking care of personal grooming?: None Help from another person toileting, which includes using toliet, bedpan, or urinal?: A Lot Help from another person bathing (including washing, rinsing, drying)?: A Lot Help from another person to put on and taking off regular upper body clothing?: A Little Help from another person to put on and taking off regular lower body clothing?: A Lot 6 Click Score: 17   End of Session Equipment Utilized During Treatment: Gait belt;Rolling walker;Other (comment)(w/c) Nurse Communication: Mobility status;Precautions  Activity Tolerance: Patient tolerated treatment well Patient left: in bed;with call bell/phone within reach;with family/visitor present  OT Visit Diagnosis: Unsteadiness on feet (R26.81)                Time: 4401-0272 OT Time Calculation (min): 14 min Charges:  OT  General Charges $OT Visit: 1 Visit OT Evaluation $OT Eval Moderate Complexity: 1 Mod G-Codes:      Jeri Modena   OTR/L Pager: [phone] Office: [phone] .   Parke Poisson B 01/28/2018, 3:53 PM

## 2018-01-28 NOTE — Progress Notes (Signed)
Dialysis treatment terminated AMA d/t cramping/generalized pain per patient.  Nephrology informed and AMA form signed and placed in patient chart.  3057 mL ultrafiltrated and net fluid removal 2457 mL.    Patient status unchanged. Lung sounds diminished to ausculation in all fields. Generalized edema. Cardiac: NSR.  Disconnected lines and removed needles.  Pressure held for 10 minutes and band aid/gauze dressing applied.  Report given to bedside RN, Phehainesh.

## 2018-01-29 DIAGNOSIS — R531 Weakness: Secondary | ICD-10-CM

## 2018-01-29 LAB — MRSA PCR SCREENING: MRSA by PCR: NEGATIVE

## 2018-01-29 LAB — BASIC METABOLIC PANEL
Anion gap: 13 (ref 5–15)
BUN: 26 mg/dL — AB (ref 6–20)
CALCIUM: 8.4 mg/dL — AB (ref 8.9–10.3)
CO2: 27 mmol/L (ref 22–32)
Chloride: 97 mmol/L — ABNORMAL LOW (ref 101–111)
Creatinine, Ser: 7.69 mg/dL — ABNORMAL HIGH (ref 0.44–1.00)
GFR calc Af Amer: 7 mL/min — ABNORMAL LOW (ref >60)
GFR, EST NON AFRICAN AMERICAN: 6 mL/min — AB (ref >60)
GLUCOSE: 115 mg/dL — AB (ref 65–99)
Potassium: 4.4 mmol/L (ref 3.5–5.1)
SODIUM: 137 mmol/L (ref 135–145)

## 2018-01-29 LAB — GLUCOSE, CAPILLARY
Glucose-Capillary: 127 mg/dL — ABNORMAL HIGH (ref 65–99)
Glucose-Capillary: 110 mg/dL — ABNORMAL HIGH (ref 65–99)
Glucose-Capillary: 159 mg/dL — ABNORMAL HIGH (ref 65–99)
Glucose-Capillary: 145 mg/dL — ABNORMAL HIGH (ref 65–99)

## 2018-01-29 LAB — CBC
HCT: 32.7 % — ABNORMAL LOW (ref 36.0–46.0)
Hemoglobin: 9.8 g/dL — ABNORMAL LOW (ref 12.0–15.0)
MCH: 27.4 pg (ref 26.0–34.0)
MCHC: 30 g/dL (ref 30.0–36.0)
MCV: 91.3 fL (ref 78.0–100.0)
PLATELETS: 159 10*3/uL (ref 150–400)
RBC: 3.58 MIL/uL — ABNORMAL LOW (ref 3.87–5.11)
RDW: 19.1 % — AB (ref 11.5–15.5)
WBC: 4.9 10*3/uL (ref 4.0–10.5)

## 2018-01-29 MED ORDER — RENA-VITE PO TABS
1.0000 | ORAL_TABLET | Freq: Every day | ORAL | Status: DC
  Administered 2018-01-29 – 2018-01-30 (×2): 1 via ORAL
  Filled 2018-01-29 (×3): qty 1

## 2018-01-29 MED ORDER — AMLODIPINE BESYLATE 10 MG PO TABS
10.0000 mg | ORAL_TABLET | Freq: Every day | ORAL | Status: DC
  Administered 2018-01-29 – 2018-01-30 (×2): 10 mg via ORAL
  Filled 2018-01-29 (×3): qty 1

## 2018-01-29 MED ORDER — HYDRALAZINE HCL 10 MG PO TABS
10.0000 mg | ORAL_TABLET | Freq: Four times a day (QID) | ORAL | Status: DC
  Administered 2018-01-29 – 2018-01-31 (×5): 10 mg via ORAL
  Filled 2018-01-29 (×9): qty 1

## 2018-01-29 NOTE — NC FL2 (Addendum)
[facility] LEVEL OF CARE SCREENING TOOL     IDENTIFICATION  Patient Name: Karen [name] Birthdate: 1975-11-04 Sex: female Admission Date (Current Location): 01/27/2018  [facility] and [place] Number:  Herbalist and Address:  [facility], [place] [address] [place], [place] [address]      Provider Number: [id number]  Attending Physician Name and Address:  Zenia Resides, MD  Relative Name and Phone Number:       Current Level of Care: Hospital Recommended Level of Care: [place] Prior Approval Number:    Date Approved/Denied:   PASRR Number: Pending  Discharge Plan: SNF    Current Diagnoses: Patient Active Problem List   Diagnosis Date Noted  . Weakness   . Acute on chronic systolic CHF (congestive heart failure) ([place])   . ESRD on dialysis ([place])   . Homelessness   . Late effect of cerebrovascular accident (CVA)   . Dyspnea 01/27/2018  . Hypertension, uncontrolled 11/11/2017  . Fluid overload 11/11/2017  . Abdominal pain   . Dysarthria 09/14/2017  . Lacunar infarct, acute ([place]) 09/14/2017  . Obesity 09/09/2017  . Chest pain 09/07/2017  . Fluid overload, unspecified 10/03/2016  . Hyperkalemia 10/03/2016  . Hypertension with fluid overload 10/03/2016  . Anemia in other chronic diseases classified elsewhere 09/08/2016  . High anion gap metabolic acidosis 23/53/6144  . Right lower lobe pneumonia ([place]) 09/08/2016  . Uncontrolled type 2 diabetes mellitus with chronic kidney disease on chronic dialysis, with long-term current use of insulin ([place]) 01/31/2016  . Noncompliance with medications   . End-stage renal disease on hemodialysis ([place])   . Hx of BKA ([place]) 07/03/2013  . S/P BKA (below knee amputation) unilateral ([place]) 06/30/2013  . Physical deconditioning 06/30/2013  . Cardiomyopathy, ischemic - EF 45-50% with inf WMA by 2D 02/05/13 02/06/2013  . Essential hypertension 02/05/2013  . PAD (peripheral artery  disease) ([place]) 02/05/2013  . Diabetic neuropathy ([place])   . Osteomyelitis of left great toe - S/P amputation 02/06/13 04/21/2011    Orientation RESPIRATION BLADDER Height & Weight     Self, Time, Situation, Place  Normal Continent Weight: 207 lb 7.3 oz (94.1 kg) Height:  6' (182.9 cm)  BEHAVIORAL SYMPTOMS/MOOD NEUROLOGICAL BOWEL NUTRITION STATUS      Continent Diet(mechanical soft)  AMBULATORY STATUS COMMUNICATION OF NEEDS Skin   Extensive Assist Verbally Normal                       Personal Care Assistance Level of Assistance  Bathing, Feeding, Dressing Bathing Assistance: Limited assistance Feeding assistance: Independent Dressing Assistance: Limited assistance     Functional Limitations Info  Sight, Hearing, Speech Sight Info: Adequate Hearing Info: Adequate Speech Info: Adequate    SPECIAL CARE FACTORS FREQUENCY  PT (By licensed PT), OT (By licensed OT), Speech therapy     PT Frequency: 5x/wk OT Frequency: 5x/wk     Speech Therapy Frequency: 5x/wk      Contractures Contractures Info: Not present    Additional Factors Info  Code Status, Allergies, Insulin Sliding Scale Code Status Info: Full Allergies Info: Baclofen, Penicillins, Morphine And Related, Novolog Insulin Aspart   Insulin Sliding Scale Info: 0-9 units 3x/day with meals       Current Medications (01/29/2018):  This is the current hospital active medication list Current Facility-Administered Medications  Medication Dose Route Frequency Provider Last Rate Last Dose  . acetaminophen (TYLENOL) tablet 650 mg  650 mg  Oral Q6H PRN Sherene Sires, DO   650 mg at 01/29/18 1355   Or  . acetaminophen (TYLENOL) suppository 650 mg  650 mg Rectal Q6H PRN Sherene Sires, DO      . acetaminophen (TYLENOL) tablet 650 mg  650 mg Oral Once Sherene Sires, DO      . amLODipine (NORVASC) tablet 10 mg  10 mg Oral QHS Zeyfang, David, PA-C      . atorvastatin (LIPITOR) tablet 80 mg  80 mg Oral q1800 Sherene Sires, DO       . [START ON 02/03/2018] Darbepoetin Alfa (ARANESP) injection 200 mcg  200 mcg Intravenous Q Thu-HD Zeyfang, David, PA-C      . doxercalciferol (HECTOROL) injection 5 mcg  5 mcg Intravenous Q T,Th,Sa-HD Ernest Haber, PA-C      . [START ON 02/03/2018] ferric gluconate (NULECIT) 62.5 mg in sodium chloride 0.9 % 100 mL IVPB  62.5 mg Intravenous Q Thu-HD Zeyfang, David, PA-C      . heparin injection 5,000 Units  5,000 Units Subcutaneous Q8H Bland, Scott, DO   5,000 Units at 01/29/18 1355  . hydrALAZINE (APRESOLINE) tablet 10 mg  10 mg Oral Q6H [place] Bing, DO   10 mg at 01/29/18 1523  . insulin regular (NOVOLIN R,HUMULIN R) 100 units/mL injection 0-9 Units  0-9 Units Subcutaneous TID WC Shirley, [place], DO   1 Units at 01/29/18 0848  . multivitamin (RENA-VIT) tablet 1 tablet  1 tablet Oral QHS Zeyfang, David, PA-C      . ondansetron [facility]) tablet 4 mg  4 mg Oral Q6H PRN Sherene Sires, DO       Or  . ondansetron (ZOFRAN) injection 4 mg  4 mg Intravenous Q6H PRN Sherene Sires, DO         Discharge Medications: Please see discharge summary for a list of discharge medications.  Relevant Imaging Results:  Relevant Lab Results:   Additional Information SS#: [id number]; HD TTS in [place], [place]

## 2018-01-29 NOTE — Evaluation (Signed)
Speech Language Pathology Evaluation Patient Details Name: Karen [name] MRN: [id number] DOB: [birth year] Today's Date: 01/29/2018 Time: 1324-4010 SLP Time Calculation (min) (ACUTE ONLY): 23 min  Problem List:  Patient Active Problem List   Diagnosis Date Noted  . Weakness   . Acute on chronic systolic CHF (congestive heart failure) ([place])   . ESRD on dialysis ([place])   . Homelessness   . Late effect of cerebrovascular accident (CVA)   . Dyspnea 01/27/2018  . Hypertension, uncontrolled 11/11/2017  . Fluid overload 11/11/2017  . Abdominal pain   . Dysarthria 09/14/2017  . Lacunar infarct, acute ([place]) 09/14/2017  . Obesity 09/09/2017  . Chest pain 09/07/2017  . Fluid overload, unspecified 10/03/2016  . Hyperkalemia 10/03/2016  . Hypertension with fluid overload 10/03/2016  . Anemia in other chronic diseases classified elsewhere 09/08/2016  . High anion gap metabolic acidosis 27/25/3664  . Right lower lobe pneumonia ([place]) 09/08/2016  . Uncontrolled type 2 diabetes mellitus with chronic kidney disease on chronic dialysis, with long-term current use of insulin ([place]) 01/31/2016  . Noncompliance with medications   . End-stage renal disease on hemodialysis ([place])   . Hx of BKA ([place]) 07/03/2013  . S/P BKA (below knee amputation) unilateral ([place]) 06/30/2013  . Physical deconditioning 06/30/2013  . Cardiomyopathy, ischemic - EF 45-50% with inf WMA by 2D 02/05/13 02/06/2013  . Essential hypertension 02/05/2013  . PAD (peripheral artery disease) ([place]) 02/05/2013  . Diabetic neuropathy ([place])   . Osteomyelitis of left great toe - S/P amputation 02/06/13 04/21/2011   Past Medical History:  Past Medical History:  Diagnosis Date  . Anemia   . Anxiety   . Dyspnea    "when I dont go to dialysis"  . Eczema   . ESRD (end stage renal disease) ([place])    [place] - [facility]- [place]  . Exertional shortness of breath    "recently; it's fluid" (02/02/2013)  . GERD (gastroesophageal reflux disease)   .  Headache   . History of blood transfusion    "last week" (02/02/2013)  . History of kidney stones    passed  . Hypertension   . Ischemic cardiomyopathy    by echo 2014  . Neuropathy   . Neuropathy   . Osteomyelitis of toe of left foot ([place])    "off and on since 2009; no OR" (02/02/2013)  . Pneumonia 09/2016  . Type II diabetes mellitus ([place]) 1995   Past Surgical History:  Past Surgical History:  Procedure Laterality Date  . AMPUTATION Left 02/06/2013   Procedure: AMPUTATION LEFT GREAT TOE;  Surgeon: Wylene Simmer, MD;  Location: [facility];  Service: Orthopedics;  Laterality: Left;  . AMPUTATION Left 06/24/2013   Procedure: AMPUTATION BELOW KNEE ;  Surgeon: Wylene Simmer, MD;  Location: [facility];  Service: Orthopedics;  Laterality: Left;  . AV FISTULA PLACEMENT Right 02/08/2017   Procedure: CREATION OF RIGHT ARM  BASILIC VEIN TO BRACHIAL ARTERY ARTERIOVENOUS (AV) FISTULA;  Surgeon: Rosetta Posner, MD;  Location: [facility];  Service: Vascular;  Laterality: Right;  . AV FISTULA PLACEMENT Left 09/10/2017   Procedure: INSERTION OF ARTERIOVENOUS (AV) GORE-TEX GRAFT  LEFT UPPER ARM;  Surgeon: Angelia Mould, MD;  Location: [facility];  Service: Vascular;  Laterality: Left;  . BASCILIC VEIN TRANSPOSITION Right 04/14/2017   Procedure: BASCILIC VEIN TRANSPOSITION-RIGHT 2ND STAGE;  Surgeon: Rosetta Posner, MD;  Location: [facility];  Service: Vascular;  Laterality: Right;  . CESAREAN SECTION  10/18/2006  . INSERTION OF DIALYSIS CATHETER  HPI:  42 y.o. female, currently homeless, with a past medical history significant for ESRD on dialysis (T, Th, S), HTN, stroke,T2DM, PAD s/p left BKA, ischemic cardiomyopathy presenting with shortness of breath, cough, extremity pain and found to have volume overload in the setting of multiple missed HD sessions. Pt followed by SLP services during Dec 2018 admission for CVA (infarct left pons, right parietal lobe, right corpus callosum/cingulate gyrus) - was found to have a mild  dysphagia/dysarthria.  She reports increased episodes of choking with meals PTA.    Assessment / Plan / Recommendation Clinical Impression  Patient presents with moderate spastic dysarthria characterized by low pitch, strained/strangled vocal quality, hypernasality, effortful speech with discoordination of respiration, phonation. Alternating motion rates are slow and irregular. Maximum sustained vowel duration is 4 seconds. Articulation impacted by mild right lingual weakness. Pt very pleasant and eager to work with ST on improving her speech. Pt responsive to cues to increase vocal intensity and overarticulate, with subjective improvements noted in articulation and duration of phonation. Administered MOCA Basic; pt scored 23/30 (>26 is normal) with mild cognitive impairments noted in selective attention, abstract reasoning. She would benefit from skilled ST to address dysarthria during her acute stay in order to improve communication and quality of life, as well as ongoing ST (recommend SNF) to address cognition and dysarthria.    SLP Assessment  SLP Recommendation/Assessment: Patient needs continued Speech Lanaguage Pathology Services SLP Visit Diagnosis: Dysarthria and anarthria (R47.1)    Follow Up Recommendations  Skilled Nursing facility    Frequency and Duration min 2x/week  2 weeks      SLP Evaluation Cognition  Overall Cognitive Status: History of cognitive impairments - at baseline Arousal/Alertness: Awake/alert Orientation Level: Oriented X4 Attention: Focused;Sustained;Selective Focused Attention: Appears intact Sustained Attention: Appears intact Selective Attention: Impaired Selective Attention Impairment: Verbal basic;Functional basic Memory: (delayed recall 5/5) Problem Solving: (functional problem solving 3/3) Executive Function: Reasoning Reasoning: Impaired Reasoning Impairment: Verbal basic(simple verbal abstraction 1/3) Safety/Judgment: Appears intact(requests  appropriate assistance for edge of bed positioning)       Comprehension  Auditory Comprehension Overall Auditory Comprehension: Appears within functional limits for tasks assessed Yes/No Questions: Within Functional Limits Commands: Within Functional Limits Conversation: Simple Visual Recognition/Discrimination Discrimination: Not tested Reading Comprehension Reading Status: Not tested    Expression Expression Primary Mode of Expression: Verbal Verbal Expression Overall Verbal Expression: Appears within functional limits for tasks assessed Initiation: No impairment Automatic Speech: Name;Social Response;Day of week;Month of year Level of Generative/Spontaneous Verbalization: Conversation Repetition: No impairment Naming: No impairment(11 fruits in 60 seconds) Pragmatics: No impairment Written Expression Dominant Hand: Left Written Expression: Not tested   Oral / Motor  Oral Motor/Sensory Function Overall Oral Motor/Sensory Function: Other (comment)(subtle right tongue deviation) Motor Speech Overall Motor Speech: Impaired at baseline Respiration: Impaired Level of Impairment: Phrase Phonation: Low vocal intensity;Other (comment)(strained/strangled vocal quality) Resonance: Hypernasality Articulation: Impaired Level of Impairment: Word Intelligibility: Intelligibility reduced Word: 75-100% accurate(95%) Phrase: 75-100% accurate(85%) Sentence: 75-100% accurate(80%) Conversation: 75-100% accurate(75%) Motor Planning: Witnin functional limits Motor Speech Errors: Aware Effective Techniques: Slow rate;Increased vocal intensity;Over-articulate;Pause   [place], [place], CCC-SLP Speech-Language Pathologist [place] 01/29/2018, 5:29 PM

## 2018-01-29 NOTE — Progress Notes (Signed)
IV infiltrated, pt refused reinsertion. MD notified. See new order.

## 2018-01-29 NOTE — Progress Notes (Signed)
Pharmacist Heart Failure Core Measure Documentation  Assessment: Karen [name] has an EF documented as 35-40% on 09/12/17 by ECHO.  Rationale: Heart failure patients with left ventricular systolic dysfunction (LVSD) and an EF < 40% should be prescribed an angiotensin converting enzyme inhibitor (ACEI) or angiotensin receptor blocker (ARB) at discharge unless a contraindication is documented in the medical record.  This patient is not currently on an ACEI or ARB for HF.  This note is being placed in the record in order to provide documentation that a contraindication to the use of these agents is present for this encounter.  ACE Inhibitor or Angiotensin Receptor Blocker is contraindicated (specify all that apply)  []   ACEI allergy AND ARB allergy []   Angioedema []   Moderate or severe aortic stenosis []   Hyperkalemia []   Hypotension []   Renal artery stenosis [x]   Worsening renal function, preexisting renal disease or dysfunction   Brain Hilts 01/29/2018 3:29 PM

## 2018-01-29 NOTE — Progress Notes (Signed)
  Speech Language Pathology Treatment: Dysphagia  Patient Details Name: Karen [name] MRN: [id number] DOB: 1976-01-18 Today's Date: 01/29/2018 Time: 4967-5916 SLP Time Calculation (min) (ACUTE ONLY): 8 min  Assessment / Plan / Recommendation Clinical Impression  Pt seen for follow-up for dysphagia; lung sounds clear and pt has been afebrile per charting. With snack of dys 3 soft solid, 8 oz thin liquids, pt consumed with immediate throat clear noted x1 after rapid intake of liquids. SLP educated re: recommendations based on MBS and encouraged slow rate and small single sips, with no further overt s/sx of aspiration. Continue current diet of dys 3, thin liquids, meds whole in puree. Will continue to follow for reinforcement of swallowing precautions and for possible upgrade of solids.     HPI HPI: 42 y.o. female, currently homeless, with a past medical history significant for ESRD on dialysis (T, Th, S), HTN, stroke,T2DM, PAD s/p left BKA, ischemic cardiomyopathy presenting with shortness of breath, cough, extremity pain and found to have volume overload in the setting of multiple missed HD sessions. Pt followed by SLP services during Dec 2018 admission for CVA (infarct left pons, right parietal lobe, right corpus callosum/cingulate gyrus) - was found to have a mild dysphagia/dysarthria.  She reports increased episodes of choking with meals PTA.       SLP Plan  Continue with current plan of care       Recommendations  Diet recommendations: Dysphagia 3 (mechanical soft);Thin liquid Liquids provided via: Cup Medication Administration: Whole meds with puree Supervision: Patient able to self feed Compensations: Small sips/bites                Oral Care Recommendations: Oral care BID Follow up Recommendations: Skilled Nursing facility SLP Visit Diagnosis: Dysphagia, pharyngeal phase (R13.13) Plan: Continue with current plan of care       [place], [place],  [place] Speech-Language Pathologist [place] 01/29/2018, 5:09 PM

## 2018-01-29 NOTE — Progress Notes (Addendum)
Subjective:   Denies SOB sitting upright in Bed /Noted sign off early HD last pm  secondary to cramping 4.0 l uf  bp stable, for HD today = normal schedule  Objective Vital signs in last 24 hours: Vitals:   01/28/18 2045 01/28/18 2050 01/28/18 2100 01/29/18 0631  BP: (!) 162/85 (!) 173/87 (!) 154/84 (!) 180/97  Pulse: 89 88 91 87  Resp: (!) 26  (!) 26   Temp: 98.1 F (36.7 C)  98.1 F (36.7 C) 98.4 F (36.9 C)  TempSrc:   Oral Oral  SpO2:   100% 98%  Weight: 94.2 kg (207 lb 10.8 oz)   94.1 kg (207 lb 7.3 oz)  Height:       Weight change: 1.345 kg (2 lb 15.4 oz)  Physical Exam:  General: alert  AAF NAD,Pleasant  Chronically ill appearing, mild dysarthria   Heart: RRR, no mrg Lungs:cta ,nonlabored breathing  Abdomen: obese, soft , NT, ND Extremities: L BKA (with prosthesis on), R LE  Now trace  pedal edema Skin: scattered lower extrem with excoriated lesions papular no dc at sites warm dry  Neuro: alert OX3 moves all extrem. Speech deficits( slurring slightly) but able to understand  Dialysis Access: pos bruit LUA AVG  Dialysis Orders: Center: [facility]  on TTS . EDW 91kg HD Bath 2k,2.25 ca  Time 4  Heparin 6000 bolus then 2000. Access LUA AVG     Mircera 225 mcg IV q 2wks (last on 5/07)   Units IV/HD  Venofer  50mg  q thurs    Problem/Plan 1. Volume overload ( sec missed HD/Noncomplaince) / hd yest as above,still 3 kg > edw  by wts/ Hd today  for normal schedule  2. ESRD -  TTS hd  3. Hypertension  - BP elevated  sec bp med noncompliance and vol ^^, uf profile 4 today  on hd / Amlodipine 10 mg make hs to avoid bp drop in hd  4. Anemia of ESRD  -  hgbv 9.7>9.2  ESA  Weekly next does next thurs/ weekly fe  on hd  5. Metabolic bone disease -  Ca 8.8  Phos 7.3 , IV hec on hd / binder  ac 6. Nutrition - renal carb mod diet restrictions / use renal vitamin  7. CVA - speech deficits - Pt, ot, Speech current eval ( dysphagia 3 diet to avoid aspiration) May need rehab, in past was in [facility]  / needs SW / CM  In put for homelessness  situation 8. HO L BKA ( Has Prosthesis)- PT/ OT seeing   Ernest Haber, PA-C [place] [phone] 01/29/2018,9:04 AM  LOS: 2 days   Labs: Basic Metabolic Panel: Recent Labs  Lab 01/27/18 1350 01/28/18 1553  NA 138 139  K 4.5 4.9  CL 99* 102  CO2 23 22  GLUCOSE 215* 128*  BUN 44* 49*  CREATININE 11.36*  11.01* 12.16*  CALCIUM 9.0 8.8*  PHOS  --  7.3*   Liver Function Tests: Recent Labs  Lab 01/27/18 1350  AST 15  ALT 17  ALKPHOS 68  BILITOT 0.6  PROT 7.6  ALBUMIN 3.5    CBC: Recent Labs  Lab 01/27/18 1350 01/28/18 1553  WBC 5.5 4.8  HGB 9.7* 9.2*  HCT 32.0* 29.4*  MCV 90.7 88.8  PLT 163 175   Cardiac Enzymes: Recent Labs  Lab 01/27/18 1350  CKTOTAL 44   CBG: Recent Labs  Lab 01/28/18 1234 01/28/18 1418 01/28/18 1713 01/28/18 2135  01/29/18 0832  GLUCAP 144* 130* 112* 121* 127*     Medications: . [START ON 02/03/2018] ferric gluconate (FERRLECIT/NULECIT) IV     . acetaminophen  650 mg Oral Once  . amLODipine  10 mg Oral Daily  . atorvastatin  80 mg Oral q1800  . [START ON 02/03/2018] darbepoetin (ARANESP) injection - DIALYSIS  200 mcg Intravenous Q Thu-HD  . doxercalciferol  5 mcg Intravenous Q T,Th,Sa-HD  . heparin  5,000 Units Subcutaneous Q8H  . insulin regular  0-9 Units Subcutaneous TID WC    I have seen and examined this patient and agree with plan and assessment in the above note with renal recommendations/intervention highlighted.  Pt signed off early last night on HD due to "cramps" but also refused to go to HD today when she was called earlier today.  Discussed her noncompliance but she has poor medical insight and judgement.  Broadus John A Nicolaas Savo,MD 01/29/2018 2:57 PM

## 2018-01-30 LAB — BASIC METABOLIC PANEL
Anion gap: 14 (ref 5–15)
BUN: 19 mg/dL (ref 6–20)
CALCIUM: 9.2 mg/dL (ref 8.9–10.3)
CHLORIDE: 97 mmol/L — AB (ref 101–111)
CO2: 29 mmol/L (ref 22–32)
CREATININE: 5.82 mg/dL — AB (ref 0.44–1.00)
GFR calc Af Amer: 10 mL/min — ABNORMAL LOW (ref >60)
GFR calc non Af Amer: 8 mL/min — ABNORMAL LOW (ref >60)
GLUCOSE: 144 mg/dL — AB (ref 65–99)
Potassium: 3.8 mmol/L (ref 3.5–5.1)
Sodium: 140 mmol/L (ref 135–145)

## 2018-01-30 LAB — CBC
HEMATOCRIT: 36 % (ref 36.0–46.0)
HEMOGLOBIN: 10.7 g/dL — AB (ref 12.0–15.0)
MCH: 27.4 pg (ref 26.0–34.0)
MCHC: 29.7 g/dL — AB (ref 30.0–36.0)
MCV: 92.1 fL (ref 78.0–100.0)
Platelets: 152 10*3/uL (ref 150–400)
RBC: 3.91 MIL/uL (ref 3.87–5.11)
RDW: 19.3 % — AB (ref 11.5–15.5)
WBC: 5.3 10*3/uL (ref 4.0–10.5)

## 2018-01-30 LAB — GLUCOSE, CAPILLARY
GLUCOSE-CAPILLARY: 157 mg/dL — AB (ref 65–99)
GLUCOSE-CAPILLARY: 144 mg/dL — AB (ref 65–99)
GLUCOSE-CAPILLARY: 168 mg/dL — AB (ref 65–99)
Glucose-Capillary: 140 mg/dL — ABNORMAL HIGH (ref 65–99)

## 2018-01-30 LAB — HEPATITIS B SURFACE ANTIGEN: Hepatitis B Surface Ag: NEGATIVE

## 2018-01-30 MED ORDER — DOXERCALCIFEROL 4 MCG/2ML IV SOLN
INTRAVENOUS | Status: AC
  Filled 2018-01-30: qty 4

## 2018-01-30 NOTE — Progress Notes (Addendum)
Subjective:  Eating brk ? No cos, refused HD  yesterday until late ="I have to EAT"  Noted plans for NPH  Objective Vital signs in last 24 hours: Vitals:   01/30/18 0330 01/30/18 0400 01/30/18 0430 01/30/18 0449  BP: (!) 158/80 (!) 153/75 (!) 148/79 (!) 159/84  Pulse: 85 88 88 85  Resp: (!) 21 20 18 18   Temp:   98.2 F (36.8 C) 98.1 F (36.7 C)  TempSrc:   Oral Oral  SpO2:   95% 96%  Weight:   90.4 kg (199 lb 4.7 oz)   Height:       Weight change: -3.2 kg (-7 lb 0.9 oz)  Physical Exam: General:alert AAF NAD,Pleasant Chronically ill appearing, mild dysarthria   Heart:RRR, no mrg Lungs:cta ,nonlabored breathing Abdomen:obese, soft , NT, ND Extremities:L BKA (with prosthesis on), R LE  NO  pedal edema Skin:scattered lower extrem with excoriated lesions papular no dc at sites warm dry Neuro:alert OX3 moves all extrem. Speech deficits( slurring slightly) but able to understand Dialysis Access:pos bruit LUA AVG  Dialysis Orders: [facility]. EDW91kgHD Bath 2k,2.25 caTime 4Heparin 6000 bolus then 2000. AccessLUA AVG  Mircera 278mcg IV q 2wks (last on 5/07)Units IV/HD Venofer 50mg  q thurs   Problem/Plan 1. Volume overload ( sec missed HD/Noncomplaince) / hd 5/10 and 5/11yest 3.0 l hd and bp 159/84 post  / now slightly below edw  New lower edw ~~90 kg  2. ESRD -TTS hdnow on schedule  3. Hypertension - on admit  BP elevated sec bp med noncompliance and vol ^^, uf on hd helping / Amlodipine 10 mg made hs to avoid bp drop in hd  4. Anemiaof ESRD- hgbv 9.7>9.2>10.7  ESA Weekly next does next thurs/ weekly fe on hd  5. Metabolic bone disease - Ca 8.8  Phos 7.3 ,IV hec on hd / binder ac 6. Nutrition -renal carb mod diet restrictions / use renal vitamin  7. CVA - speech deficits - Pt, ot, Speech current eval ( dysphagia 3 diet to avoid aspiration) May need rehab, in past was in [facility] / needs SW / CM In put for homelessness   situation 8. HO L BKA ( Has Prosthesis)- PT/ OT seeing 9. Disposition- SNHP     Ernest Haber, PA-C [facility] [place] [phone] 01/30/2018,9:36 AM  LOS: 3 days   Labs: Basic Metabolic Panel: Recent Labs  Lab 01/28/18 1553 01/29/18 0854 01/30/18 0532  NA 139 137 140  K 4.9 4.4 3.8  CL 102 97* 97*  CO2 22 27 29   GLUCOSE 128* 115* 144*  BUN 49* 26* 19  CREATININE 12.16* 7.69* 5.82*  CALCIUM 8.8* 8.4* 9.2  PHOS 7.3*  --   --    Liver Function Tests: Recent Labs  Lab 01/27/18 1350  AST 15  ALT 17  ALKPHOS 68  BILITOT 0.6  PROT 7.6  ALBUMIN 3.5    CBC: Recent Labs  Lab 01/27/18 1350 01/28/18 1553 01/29/18 0854 01/30/18 0532  WBC 5.5 4.8 4.9 5.3  HGB 9.7* 9.2* 9.8* 10.7*  HCT 32.0* 29.4* 32.7* 36.0  MCV 90.7 88.8 91.3 92.1  PLT 163 175 159 152   Cardiac Enzymes: Recent Labs  Lab 01/27/18 1350  CKTOTAL 44   CBG: Recent Labs  Lab 01/29/18 0832 01/29/18 1213 01/29/18 1732 01/29/18 2309 01/30/18 0742  GLUCAP 127* 110* 159* 145* 157*     Medications: . [START ON 02/03/2018] ferric gluconate (FERRLECIT/NULECIT) IV     . acetaminophen  650 mg  Oral Once  . amLODipine  10 mg Oral QHS  . atorvastatin  80 mg Oral q1800  . [START ON 02/03/2018] darbepoetin (ARANESP) injection - DIALYSIS  200 mcg Intravenous Q Thu-HD  . doxercalciferol  5 mcg Intravenous Q T,Th,Sa-HD  . heparin  5,000 Units Subcutaneous Q8H  . hydrALAZINE  10 mg Oral Q6H  . insulin regular  0-9 Units Subcutaneous TID WC  . multivitamin  1 tablet Oral QHS    I have seen and examined this patient and agree with plan and assessment in the above note with renal recommendations/intervention highlighted.  Broadus John A Cordarrius Coad,MD 01/30/2018 12:08 PM

## 2018-01-30 NOTE — Progress Notes (Signed)
Pt to dialysis per bed

## 2018-01-30 NOTE — Progress Notes (Signed)
Family Medicine Teaching Service Daily Progress Note Intern Pager: [phone]  Patient name: Karen [name] Medical record number: [id number] Date of birth: 04-Apr-1976 Age: 42 y.o. Gender: female  Primary Care Provider: Patient, No Pcp Per Consultants: Nephrology Code Status: Full  Pt Overview and Major Events to Date:  05/09 Admitted to FPTS  Assessment and Plan: Volume overload from missed HD sessions  ESRD  Beginning to improve after HD. CHF may be contributing as well. Missed HD sessions due to non-compliance and poor social situation (see below).  - Nephrology consulted, appreciate recommendations - continue HD  - Daily weights - Strict I/Os  Homelessness/poor social situation Currently homeless. Concern for underlying mental illness contributing to overall poor health and non-compliance. PT/OT recommending SNF.  - CSW consulted for SNF placement  Hypertension, uncontrolled BP this am 159/84. Likely related to volume status. - Appreciate nephrology recommendations, continue volume removal with HD - Hydralazine as needed - Amlodipine 10 mg daily  History of CVA with persistent dysarthria - SLP following, dysphagia diet - cont home asa, statin  Left BKA Uses prosthesis, no pressure ulcers on stump.  - PT/OT recommending SNF  Disseminated extremity skin nodules Reportedly present on upper and lower extremities since 2004. 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  - Continue to monitor.  - May need biopsy or further treatment in outpatient setting  FEN/GI: Dysphagia 3 diet DVT PPx: Heparin  Disposition: awaiting SNF placement  Subjective:  Doing well this morning. Was able to do a full HD session. Denies any complaints including SOB, nausea, abdominal pain, chest pain. She is hopeful for SNF placement.  Objective: Temp:  [98.1 F (36.7 C)-98.6 F (37 C)] 98.1 F (36.7 C) (05/12 0449) Pulse Rate:  [84-93] 85 (05/12 0449) Resp:   [18-23] 18 (05/12 0449) BP: (148-191)/(75-102) 159/84 (05/12 0449) SpO2:  [95 %-97 %] 96 % (05/12 0449) Weight:  [199 lb 4.7 oz (90.4 kg)-205 lb 14.6 oz (93.4 kg)] 199 lb 4.7 oz (90.4 kg) (05/12 0430) Physical Exam: ELF:YBOFB in bed in NAD CV: RRR, no murmurs appreciated Pulm: bilateral rhales, Normal WOB on RA GI: soft, NTND, +BS MSK: R BKA Skin: multiple calloused nodules on extremities upper and lower Neuro: A&Ox3 Psych: flat affect  Laboratory: Recent Labs  Lab 01/28/18 1553 01/29/18 0854 01/30/18 0532  WBC 4.8 4.9 5.3  HGB 9.2* 9.8* 10.7*  HCT 29.4* 32.7* 36.0  PLT 175 159 152   Recent Labs  Lab 01/27/18 1350 01/28/18 1553 01/29/18 0854 01/30/18 0532  NA 138 139 137 140  K 4.5 4.9 4.4 3.8  CL 99* 102 97* 97*  CO2 23 22 27 29   BUN 44* 49* 26* 19  CREATININE 11.36*  11.01* 12.16* 7.69* 5.82*  CALCIUM 9.0 8.8* 8.4* 9.2  PROT 7.6  --   --   --   BILITOT 0.6  --   --   --   ALKPHOS 68  --   --   --   ALT 17  --   --   --   AST 15  --   --   --   GLUCOSE 215* 128* 115* 144*   Imaging/Diagnostic Tests: Dg Chest 2 View  Result Date: 01/27/2018 CLINICAL DATA:  42 y/o  F; weak all over and shortness of breath. EXAM: CHEST - 2 VIEW COMPARISON:  01/26/2018 chest radiograph FINDINGS: Stable cardiomegaly given projection and technique. Pulmonary venous hypertension and hazy opacification of the lungs. No pleural effusion or pneumothorax. No focal  consolidation. Bones are unremarkable. IMPRESSION: Stable cardiomegaly. Pulmonary venous hypertension and hazy lungs probably representing mild pulmonary edema. Electronically Signed   By: Kristine Garbe M.D.   On: 01/27/2018 18:42    Steve Rattler, DO 01/30/2018, 9:43 AM PGY-2, [place] Intern pager: [phone], text pages welcome

## 2018-01-30 NOTE — Progress Notes (Signed)
Report taken from dialysis nurse, Pulled out 3L of blood from lpt. BP at 169/86, HR at 84.

## 2018-01-31 ENCOUNTER — Encounter (HOSPITAL_COMMUNITY): Payer: Self-pay | Admitting: General Practice

## 2018-01-31 ENCOUNTER — Other Ambulatory Visit: Payer: Self-pay

## 2018-01-31 LAB — BASIC METABOLIC PANEL
Anion gap: 14 (ref 5–15)
BUN: 32 mg/dL — AB (ref 6–20)
CHLORIDE: 97 mmol/L — AB (ref 101–111)
CO2: 28 mmol/L (ref 22–32)
CREATININE: 7.58 mg/dL — AB (ref 0.44–1.00)
Calcium: 9.2 mg/dL (ref 8.9–10.3)
GFR calc Af Amer: 7 mL/min — ABNORMAL LOW (ref >60)
GFR, EST NON AFRICAN AMERICAN: 6 mL/min — AB (ref >60)
Glucose, Bld: 165 mg/dL — ABNORMAL HIGH (ref 65–99)
POTASSIUM: 4.3 mmol/L (ref 3.5–5.1)
SODIUM: 139 mmol/L (ref 135–145)

## 2018-01-31 LAB — CBC
HCT: 31.7 % — ABNORMAL LOW (ref 36.0–46.0)
HEMOGLOBIN: 9.3 g/dL — AB (ref 12.0–15.0)
MCH: 27.1 pg (ref 26.0–34.0)
MCHC: 29.3 g/dL — ABNORMAL LOW (ref 30.0–36.0)
MCV: 92.4 fL (ref 78.0–100.0)
PLATELETS: 160 10*3/uL (ref 150–400)
RBC: 3.43 MIL/uL — AB (ref 3.87–5.11)
RDW: 19.3 % — ABNORMAL HIGH (ref 11.5–15.5)
WBC: 6.2 10*3/uL (ref 4.0–10.5)

## 2018-01-31 LAB — GLUCOSE, CAPILLARY
GLUCOSE-CAPILLARY: 219 mg/dL — AB (ref 65–99)
Glucose-Capillary: 132 mg/dL — ABNORMAL HIGH (ref 65–99)
Glucose-Capillary: 234 mg/dL — ABNORMAL HIGH (ref 65–99)

## 2018-01-31 MED ORDER — POLYETHYLENE GLYCOL 3350 17 G PO PACK
17.0000 g | PACK | Freq: Every day | ORAL | Status: DC
  Administered 2018-01-31: 17 g via ORAL
  Filled 2018-01-31 (×2): qty 1

## 2018-01-31 NOTE — Progress Notes (Signed)
[facility] Progress Note   Subjective: Up in chair, talking on phone. No C/Os.   Objective Vitals:   01/30/18 1811 01/30/18 2047 01/31/18 0112 01/31/18 0612  BP: (!) 184/93 (!) 171/107 (!) 168/58 (!) 146/99  Pulse:  99  89  Resp:      Temp:  98.5 F (36.9 C)  98.5 F (36.9 C)  TempSrc:  Oral  Oral  SpO2:  92%  95%  Weight:    93.3 kg (205 lb 11 oz)  Height:       Physical Exam General: Chronically ill appearing female in NAD Heart: S1,S2, RRR Lungs: CTAB No WOB Abdomen: Active BS Extremities: L BKA no stump edema, RLE no edema.  Dialysis Access: LUA AVG + bruit.   Additional Objective Labs: Basic Metabolic Panel: Recent Labs  Lab 01/28/18 1553 01/29/18 0854 01/30/18 0532 01/31/18 0428  NA 139 137 140 139  K 4.9 4.4 3.8 4.3  CL 102 97* 97* 97*  CO2 22 27 29 28   GLUCOSE 128* 115* 144* 165*  BUN 49* 26* 19 32*  CREATININE 12.16* 7.69* 5.82* 7.58*  CALCIUM 8.8* 8.4* 9.2 9.2  PHOS 7.3*  --   --   --    Liver Function Tests: Recent Labs  Lab 01/27/18 1350  AST 15  ALT 17  ALKPHOS 68  BILITOT 0.6  PROT 7.6  ALBUMIN 3.5   No results for input(s): LIPASE, AMYLASE in the last 168 hours. CBC: Recent Labs  Lab 01/27/18 1350 01/28/18 1553 01/29/18 0854 01/30/18 0532 01/31/18 0428  WBC 5.5 4.8 4.9 5.3 6.2  HGB 9.7* 9.2* 9.8* 10.7* 9.3*  HCT 32.0* 29.4* 32.7* 36.0 31.7*  MCV 90.7 88.8 91.3 92.1 92.4  PLT 163 175 159 152 160   Blood Culture    Component Value Date/Time   SDES BLOOD RIGHT ANTECUBITAL 09/07/2017 1830   SPECREQUEST IN PEDIATRIC BOTTLE Blood Culture adequate volume 09/07/2017 1830   CULT NO GROWTH 5 DAYS 09/07/2017 1830   REPTSTATUS 09/12/2017 FINAL 09/07/2017 1830    Cardiac Enzymes: Recent Labs  Lab 01/27/18 1350  CKTOTAL 44   CBG: Recent Labs  Lab 01/30/18 1157 01/30/18 1656 01/30/18 2101 01/31/18 0809 01/31/18 1236  GLUCAP 144* 168* 140* 132* 219*   Iron Studies: No results for input(s): IRON, TIBC,  TRANSFERRIN, FERRITIN in the last 72 hours. @lablastinr3 @ Studies/Results: No results found. Medications: . [START ON 02/03/2018] ferric gluconate (FERRLECIT/NULECIT) IV     . acetaminophen  650 mg Oral Once  . amLODipine  10 mg Oral QHS  . atorvastatin  80 mg Oral q1800  . [START ON 02/03/2018] darbepoetin (ARANESP) injection - DIALYSIS  200 mcg Intravenous Q Thu-HD  . doxercalciferol  5 mcg Intravenous Q T,Th,Sa-HD  . heparin  5,000 Units Subcutaneous Q8H  . hydrALAZINE  10 mg Oral Q6H  . insulin regular  0-9 Units Subcutaneous TID WC  . multivitamin  1 tablet Oral QHS  . polyethylene glycol  17 g Oral Daily     Dialysis Orders: [facility]. EDW91kgHD Bath 2k,2.25 ca4 Hrs -Heparin 6000 unit IV initial bolus, Heparin 2000 units IV mid run. -Mircera 219mcg IV q 2wks (last on 5/07) -Venofer 50mg  IV weekly   Problem/Plan 1. Volume overload 2/2 noncompliance with HD. (Chronic issue). Serial HD 05/10 and 01/29/2018. Now under OP EDW. No evidence of volume overload by exam.  2. ESRD -TTS via LUA AVG 3. Hypertension -BP still elevated. On amlodipine 10 mg PO Q HS, has been started  on hydralazine. HD tomorrow. Wt today 93.3 kg. Try to lower EDW.  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 Recent ESA dose. Follow HGB. Continue weekly Fe.  5. Metabolic bone disease -Ca 9.2 C ca  Phos 7.3  6. Nutrition -Albumin 3.5. DYS 3 diet, renal vit, nepro.  7. CVA - speech deficits - Pt, ot, Speechcurrent eval ( dysphagia 3 diet to avoid aspiration)May need rehab, in past was in [facility] / needs SW / CM In put for homelessnesssituation 8. HO L BKA ( Has Prosthesis)- PT/ OT seeing 9. Disposition-DC to SNF.       Rita H. Brown NP-C 01/31/2018, 1:59 PM  [facility] [phone]  Pt seen, examined and agree w A/P as above.  Kelly Splinter MD [employer] pager [phone]   01/31/2018, 1:59 PM

## 2018-01-31 NOTE — Care Management Important Message (Signed)
Important Message  Patient Details  Name: Karen [name] MRN: [id number] Date of Birth: 1976/06/08   Medicare Important Message Given:  No  Patient sleep/Unsigned copy left at bedside  [facility] 01/31/2018, 3:32 PM

## 2018-01-31 NOTE — Progress Notes (Addendum)
Physical Therapy Treatment Patient Details Name: Karen [name] MRN: [id number] DOB: [birth year] Today's Date: 01/31/2018    History of Present Illness Pt is a 42 y.o. female admitted 01/28/18 with SOB, found to have volume overload in setting of multiple missed HD sessions. PMH includes ESRD (HD TTS), HTN, CVA (2018), PAD s/p L BKA (2014), DM.     PT Comments    Pt showing improved mobility this session, as she required less assist with transfers. Min guard for sit<>stand and stand pivot transfers. Pt able to self propel WC 75 ft before UE fatigue. Unable to walk with prosthesis at this time as prothesis is very large. Pt may benefit from prosthetist consult. Will continue to follow acutely to maximize pt's functional independence and safety with mobility.     Follow Up Recommendations  SNF;Supervision for mobility/OOB     Equipment Recommendations  3in1 (PT);Wheelchair (measurements PT);Wheelchair cushion (measurements PT)(pt current lightweight manual w/c in poor condition)    Recommendations for Other Services Speech consult;OT consult     Precautions / Restrictions Precautions Precautions: Fall Precaution Comments: L BKA (has prosthetic) Restrictions Weight Bearing Restrictions: No    Mobility  Bed Mobility               General bed mobility comments: sittin EOB on arrival  Transfers Overall transfer level: Needs assistance Equipment used: Rolling walker (2 wheeled) Transfers: Sit to/from [facility] Sit to Stand: Min guard Stand pivot transfers: Min guard       General transfer comment: Pt performed stant pivot transfer x2 (bed>BSC>WC) with min guard for safety. No physical assist required.  Ambulation/Gait             General Gait Details: pt needing socks for prosthesis to be able to ambulate w/o pain and skin break down.   Stairs             Information systems manager mobility: Yes Wheelchair  propulsion: Both upper extremities;Right lower extremity Wheelchair parts: Independent Distance: 43 Wheelchair Assistance Details (indicate cue type and reason): Pt able to self propel 75 ft before UE fatigue. Supervision for safety. Pt independent with WC brakes.   Modified Rankin (Stroke Patients Only)       Balance Overall balance assessment: Needs assistance   Sitting balance-Leahy Scale: Good Sitting balance - Comments: Indep to don LLE prosthetic while sitting     Standing balance-Leahy Scale: Poor Standing balance comment: Reliant on UE support                            Cognition Arousal/Alertness: Awake/alert Behavior During Therapy: WFL for tasks assessed/performed Overall Cognitive Status: History of cognitive impairments - at baseline                                        Exercises      General Comments        Pertinent Vitals/Pain Pain Assessment: No/denies pain    Home Living Family/patient expects to be discharged to:: Private residence Living Arrangements: Alone                  Prior Function            PT Goals (current goals can now be found in the care plan section) Acute Rehab PT Goals Patient Stated Goal: to go to  rehab PT Goal Formulation: With patient Time For Goal Achievement: 02/11/18 Potential to Achieve Goals: Fair Progress towards PT goals: Progressing toward goals    Frequency    Min 2X/week      PT Plan Current plan remains appropriate    Co-evaluation              AM-PAC PT "6 Clicks" Daily Activity  Outcome Measure  Difficulty turning over in bed (including adjusting bedclothes, sheets and blankets)?: A Little Difficulty moving from lying on back to sitting on the side of the bed? : A Little Difficulty sitting down on and standing up from a chair with arms (e.g., wheelchair, bedside commode, etc,.)?: Unable Help needed moving to and from a bed to chair (including a  wheelchair)?: A Little Help needed walking in hospital room?: A Little Help needed climbing 3-5 steps with a railing? : A Lot 6 Click Score: 15    End of Session Equipment Utilized During Treatment: Gait belt Activity Tolerance: Patient tolerated treatment well Patient left: in chair;with call bell/phone within reach(in WC) Nurse Communication: Mobility status PT Visit Diagnosis: Other abnormalities of gait and mobility (R26.89)     Time: 5427-0623 PT Time Calculation (min) (ACUTE ONLY): 19 min  Charges:  $Wheel Chair Management: 8-22 mins                    G Codes:       Benjiman Core, [place] Pager [phone] Acute Rehab   Allena Katz 01/31/2018, 12:00 PM

## 2018-01-31 NOTE — Social Work (Signed)
CSW aware pt is recommended for SNF placement. Assisting with disposition.   [place], [place] Work [phone]

## 2018-01-31 NOTE — Progress Notes (Deleted)
[facility] Progress Note   Subjective: Up in chair, talking on phone. No C/Os.   Objective Vitals:   01/30/18 1811 01/30/18 2047 01/31/18 0112 01/31/18 0612  BP: (!) 184/93 (!) 171/107 (!) 168/58 (!) 146/99  Pulse:  99  89  Resp:      Temp:  98.5 F (36.9 C)  98.5 F (36.9 C)  TempSrc:  Oral  Oral  SpO2:  92%  95%  Weight:    93.3 kg (205 lb 11 oz)  Height:       Physical Exam General: Chronically ill appearing female in NAD Heart: S1,S2, RRR Lungs: CTAB No WOB Abdomen: Active BS Extremities: L BKA no stump edema, RLE no edema.  Dialysis Access: LUA AVG + bruit.   Additional Objective Labs: Basic Metabolic Panel: Recent Labs  Lab 01/28/18 1553 01/29/18 0854 01/30/18 0532 01/31/18 0428  NA 139 137 140 139  K 4.9 4.4 3.8 4.3  CL 102 97* 97* 97*  CO2 22 27 29 28   GLUCOSE 128* 115* 144* 165*  BUN 49* 26* 19 32*  CREATININE 12.16* 7.69* 5.82* 7.58*  CALCIUM 8.8* 8.4* 9.2 9.2  PHOS 7.3*  --   --   --    Liver Function Tests: Recent Labs  Lab 01/27/18 1350  AST 15  ALT 17  ALKPHOS 68  BILITOT 0.6  PROT 7.6  ALBUMIN 3.5   No results for input(s): LIPASE, AMYLASE in the last 168 hours. CBC: Recent Labs  Lab 01/27/18 1350 01/28/18 1553 01/29/18 0854 01/30/18 0532 01/31/18 0428  WBC 5.5 4.8 4.9 5.3 6.2  HGB 9.7* 9.2* 9.8* 10.7* 9.3*  HCT 32.0* 29.4* 32.7* 36.0 31.7*  MCV 90.7 88.8 91.3 92.1 92.4  PLT 163 175 159 152 160   Blood Culture    Component Value Date/Time   SDES BLOOD RIGHT ANTECUBITAL 09/07/2017 1830   SPECREQUEST IN PEDIATRIC BOTTLE Blood Culture adequate volume 09/07/2017 1830   CULT NO GROWTH 5 DAYS 09/07/2017 1830   REPTSTATUS 09/12/2017 FINAL 09/07/2017 1830    Cardiac Enzymes: Recent Labs  Lab 01/27/18 1350  CKTOTAL 44   CBG: Recent Labs  Lab 01/30/18 0742 01/30/18 1157 01/30/18 1656 01/30/18 2101 01/31/18 0809  GLUCAP 157* 144* 168* 140* 132*   Iron Studies: No results for input(s): IRON, TIBC,  TRANSFERRIN, FERRITIN in the last 72 hours. @lablastinr3 @ Studies/Results: No results found. Medications: . [START ON 02/03/2018] ferric gluconate (FERRLECIT/NULECIT) IV     . acetaminophen  650 mg Oral Once  . amLODipine  10 mg Oral QHS  . atorvastatin  80 mg Oral q1800  . [START ON 02/03/2018] darbepoetin (ARANESP) injection - DIALYSIS  200 mcg Intravenous Q Thu-HD  . doxercalciferol  5 mcg Intravenous Q T,Th,Sa-HD  . heparin  5,000 Units Subcutaneous Q8H  . hydrALAZINE  10 mg Oral Q6H  . insulin regular  0-9 Units Subcutaneous TID WC  . multivitamin  1 tablet Oral QHS  . polyethylene glycol  17 g Oral Daily     Dialysis Orders: Center:[facility] T[facility]. EDW91kgHD Bath 2k,2.25 ca4 Hrs -Heparin 6000 unit IV initial bolus, Heparin 2000 units IV mid run. -Mircera 272mcg IV q 2wks (last on 5/07) -Venofer 50mg  IV weekly   Problem/Plan 1. Volume overload 2/2 noncompliance with HD. (Chronic issue). Serial HD 05/10 and 01/29/2018. Now under OP EDW. No evidence of volume overload by exam.  2. ESRD -TTS via LUA AVG 3. Hypertension -BP still elevated. On amlodipine 10 mg PO Q HS, has been started  on hydralazine. HD tomorrow. Wt today 93.3 kg. Try to lower EDW.  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 Recent ESA dose. Follow HGB. Continue weekly Fe.  5. Metabolic bone disease -Ca 9.2 C ca  Phos 7.3  6. Nutrition -Albumin 3.5. DYS 3 diet, renal vit, nepro.  7. CVA - speech deficits - Pt, ot, Speechcurrent eval ( dysphagia 3 diet to avoid aspiration)May need rehab, in past was in [facility] / needs SW / CM In put for homelessnesssituation 8. HO L BKA ( Has Prosthesis)- PT/ OT seeing 9. Disposition-DC to SNF.       Rita H. Brown NP-C 01/31/2018, 10:50 AM  [employer] [phone]

## 2018-01-31 NOTE — Progress Notes (Signed)
Family Medicine Teaching Service Daily Progress Note Intern Pager: [phone]  Patient name: Karen [name] Medical record number: [id number] Date of birth: [birth year] Age: 42 y.o. Gender: female  Primary Care Provider: Patient, No Pcp Per Consultants: Nephrology Code Status: Full  Pt Overview and Major Events to Date:  05/09 Admitted to FPTS  Assessment and Plan: Volume overload from missed HD sessions  ESRD  Beginning to improve after HD. CHF may be contributing as well. Missed HD sessions due to non-compliance and poor social situation (see below). Per nephro, now slightly below EDW.  - Nephrology consulted, appreciate recommendations - continue HD  - Daily weights - Strict I/Os  Homelessness/poor social situation Currently homeless. Concern for underlying mental illness contributing to overall poor health and non-compliance. PT/OT recommending SNF.  - CSW consulted for SNF placement  Hypertension, uncontrolled BP this am 146/99. Likely related to volume status but overall improving.  - Appreciate nephrology recommendations, continue volume removal with HD - Hydralazine as needed for SBP >160 or DBP >110  - Amlodipine 10 mg daily  History of CVA with persistent dysarthria - SLP following, dysphagia diet - cont home asa, statin  Left BKA Uses prosthesis, no pressure ulcers on stump.  - PT/OT recommending SNF  Disseminated extremity skin nodules Reportedly present on upper and lower extremities since 2004. 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  - Continue to monitor.  - May need biopsy or further treatment in outpatient setting  FEN/GI: Dysphagia 3 diet DVT PPx: Heparin  Disposition: awaiting SNF placement  Subjective:  Doing well this morning. Denies any complaints including SOB, nausea, abdominal pain, chest pain. She is hopeful for SNF placement. No concerns.   Objective: Temp:  [98.4 F (36.9 C)-98.5 F (36.9 C)] 98.5 F  (36.9 C) (05/13 0612) Pulse Rate:  [89-99] 89 (05/13 0612) Resp:  [17] 17 (05/12 1321) BP: (145-184)/(58-107) 146/99 (05/13 0612) SpO2:  [92 %-100 %] 95 % (05/13 0612) Weight:  [205 lb 11 oz (93.3 kg)] 205 lb 11 oz (93.3 kg) (05/13 0612) Physical Exam: HQI:ONGEX in bed in NAD CV: RRR, no murmurs appreciated Pulm: bilateral rhales, Normal WOB on RA GI: soft, NTND, +BS MSK: R BKA Skin: multiple calloused nodules on extremities upper and lower Neuro: A&Ox3 Psych: flat affect  Laboratory: Recent Labs  Lab 01/29/18 0854 01/30/18 0532 01/31/18 0428  WBC 4.9 5.3 6.2  HGB 9.8* 10.7* 9.3*  HCT 32.7* 36.0 31.7*  PLT 159 152 160   Recent Labs  Lab 01/27/18 1350  01/29/18 0854 01/30/18 0532 01/31/18 0428  NA 138   < > 137 140 139  K 4.5   < > 4.4 3.8 4.3  CL 99*   < > 97* 97* 97*  CO2 23   < > 27 29 28   BUN 44*   < > 26* 19 32*  CREATININE 11.36*  11.01*   < > 7.69* 5.82* 7.58*  CALCIUM 9.0   < > 8.4* 9.2 9.2  PROT 7.6  --   --   --   --   BILITOT 0.6  --   --   --   --   ALKPHOS 68  --   --   --   --   ALT 17  --   --   --   --   AST 15  --   --   --   --   GLUCOSE 215*   < > 115* 144* 165*   < > =  values in this interval not displayed.   Imaging/Diagnostic Tests: Dg Chest 2 View  Result Date: 01/27/2018 CLINICAL DATA:  42 y/o  F; weak all over and shortness of breath. EXAM: CHEST - 2 VIEW COMPARISON:  01/26/2018 chest radiograph FINDINGS: Stable cardiomegaly given projection and technique. Pulmonary venous hypertension and hazy opacification of the lungs. No pleural effusion or pneumothorax. No focal consolidation. Bones are unremarkable. IMPRESSION: Stable cardiomegaly. Pulmonary venous hypertension and hazy lungs probably representing mild pulmonary edema. Electronically Signed   By: Kristine Garbe M.D.   On: 01/27/2018 18:42    Nicolette Bang, DO 01/31/2018, 8:33 AM PGY-3, [place] Intern pager: [phone], text pages  welcome

## 2018-01-31 NOTE — Progress Notes (Signed)
  Speech Language Pathology Treatment: Dysphagia;Cognitive-Linquistic  Patient Details Name: Karen [name] MRN: [id number] DOB: Oct 12, 1975 Today's Date: 01/31/2018 Time: 6144-3154 SLP Time Calculation (min) (ACUTE ONLY): 15 min  Assessment / Plan / Recommendation Clinical Impression  Pt was drowsy throughout session today, reporting that she did not sleep overnight. She verbalized her swallowing strategies well, needing Min cues to implement them during consumption. Pt attempted regular textures with prolonged mastication and mild oral residue, with throat clearing noted post-thin liquid wash. Despite cues from SLP, pt cannot produce a forceful volitional cough. Pt needed Mod cues to recall speech intelligibility strategies from previous SLP visit. SLP provided Min cues for use of over articulation during conversation. Min cues were provided for safety awareness during transferring. Will continue to follow.   HPI HPI: 42 y.o. female, currently homeless, with a past medical history significant for ESRD on dialysis (T, Th, S), HTN, stroke,T2DM, PAD s/p left BKA, ischemic cardiomyopathy presenting with shortness of breath, cough, extremity pain and found to have volume overload in the setting of multiple missed HD sessions. Pt followed by SLP services during Dec 2018 admission for CVA (infarct left pons, right parietal lobe, right corpus callosum/cingulate gyrus) - was found to have a mild dysphagia/dysarthria.  She reports increased episodes of choking with meals PTA.       SLP Plan  Continue with current plan of care       Recommendations  Diet recommendations: Dysphagia 3 (mechanical soft);Thin liquid Liquids provided via: Cup Medication Administration: Whole meds with puree Supervision: Patient able to self feed Compensations: Small sips/bites Postural Changes and/or Swallow Maneuvers: Seated upright 90 degrees                Oral Care Recommendations: Oral care BID Follow up  Recommendations: Skilled Nursing facility SLP Visit Diagnosis: Dysarthria and anarthria (R47.1);Dysphagia, oropharyngeal phase (R13.12) Plan: Continue with current plan of care       GO                Germain Osgood 01/31/2018, 1:50 PM  Germain Osgood, M.A. CCC-SLP [phone]

## 2018-02-01 DIAGNOSIS — I10 Essential (primary) hypertension: Secondary | ICD-10-CM | POA: Diagnosis not present

## 2018-02-01 DIAGNOSIS — Z743 Need for continuous supervision: Secondary | ICD-10-CM | POA: Diagnosis not present

## 2018-02-01 DIAGNOSIS — K5900 Constipation, unspecified: Secondary | ICD-10-CM | POA: Diagnosis not present

## 2018-02-01 DIAGNOSIS — R279 Unspecified lack of coordination: Secondary | ICD-10-CM | POA: Diagnosis not present

## 2018-02-01 DIAGNOSIS — I160 Hypertensive urgency: Secondary | ICD-10-CM | POA: Diagnosis not present

## 2018-02-01 DIAGNOSIS — I429 Cardiomyopathy, unspecified: Secondary | ICD-10-CM | POA: Diagnosis not present

## 2018-02-01 DIAGNOSIS — I6930 Unspecified sequelae of cerebral infarction: Secondary | ICD-10-CM | POA: Diagnosis not present

## 2018-02-01 DIAGNOSIS — Z89519 Acquired absence of unspecified leg below knee: Secondary | ICD-10-CM | POA: Diagnosis not present

## 2018-02-01 DIAGNOSIS — E8770 Fluid overload, unspecified: Secondary | ICD-10-CM | POA: Diagnosis not present

## 2018-02-01 DIAGNOSIS — D631 Anemia in chronic kidney disease: Secondary | ICD-10-CM | POA: Diagnosis not present

## 2018-02-01 DIAGNOSIS — I6381 Other cerebral infarction due to occlusion or stenosis of small artery: Secondary | ICD-10-CM | POA: Diagnosis not present

## 2018-02-01 DIAGNOSIS — R079 Chest pain, unspecified: Secondary | ICD-10-CM | POA: Diagnosis not present

## 2018-02-01 DIAGNOSIS — R471 Dysarthria and anarthria: Secondary | ICD-10-CM | POA: Diagnosis not present

## 2018-02-01 DIAGNOSIS — Z590 Homelessness: Secondary | ICD-10-CM | POA: Diagnosis not present

## 2018-02-01 DIAGNOSIS — I120 Hypertensive chronic kidney disease with stage 5 chronic kidney disease or end stage renal disease: Secondary | ICD-10-CM | POA: Diagnosis not present

## 2018-02-01 DIAGNOSIS — Z9114 Patient's other noncompliance with medication regimen: Secondary | ICD-10-CM | POA: Diagnosis not present

## 2018-02-01 DIAGNOSIS — R262 Difficulty in walking, not elsewhere classified: Secondary | ICD-10-CM | POA: Diagnosis not present

## 2018-02-01 DIAGNOSIS — J189 Pneumonia, unspecified organism: Secondary | ICD-10-CM | POA: Diagnosis not present

## 2018-02-01 DIAGNOSIS — Z794 Long term (current) use of insulin: Secondary | ICD-10-CM | POA: Diagnosis not present

## 2018-02-01 DIAGNOSIS — R1312 Dysphagia, oropharyngeal phase: Secondary | ICD-10-CM | POA: Diagnosis not present

## 2018-02-01 DIAGNOSIS — E1122 Type 2 diabetes mellitus with diabetic chronic kidney disease: Secondary | ICD-10-CM | POA: Diagnosis not present

## 2018-02-01 DIAGNOSIS — E785 Hyperlipidemia, unspecified: Secondary | ICD-10-CM | POA: Diagnosis not present

## 2018-02-01 DIAGNOSIS — R531 Weakness: Secondary | ICD-10-CM | POA: Diagnosis not present

## 2018-02-01 DIAGNOSIS — L309 Dermatitis, unspecified: Secondary | ICD-10-CM | POA: Diagnosis not present

## 2018-02-01 DIAGNOSIS — E119 Type 2 diabetes mellitus without complications: Secondary | ICD-10-CM | POA: Diagnosis not present

## 2018-02-01 DIAGNOSIS — R0600 Dyspnea, unspecified: Secondary | ICD-10-CM | POA: Diagnosis not present

## 2018-02-01 DIAGNOSIS — M6281 Muscle weakness (generalized): Secondary | ICD-10-CM | POA: Diagnosis not present

## 2018-02-01 DIAGNOSIS — E669 Obesity, unspecified: Secondary | ICD-10-CM | POA: Diagnosis not present

## 2018-02-01 DIAGNOSIS — E1129 Type 2 diabetes mellitus with other diabetic kidney complication: Secondary | ICD-10-CM | POA: Diagnosis not present

## 2018-02-01 DIAGNOSIS — R278 Other lack of coordination: Secondary | ICD-10-CM | POA: Diagnosis not present

## 2018-02-01 DIAGNOSIS — I5033 Acute on chronic diastolic (congestive) heart failure: Secondary | ICD-10-CM | POA: Diagnosis not present

## 2018-02-01 DIAGNOSIS — D509 Iron deficiency anemia, unspecified: Secondary | ICD-10-CM | POA: Diagnosis not present

## 2018-02-01 DIAGNOSIS — N2581 Secondary hyperparathyroidism of renal origin: Secondary | ICD-10-CM | POA: Diagnosis not present

## 2018-02-01 DIAGNOSIS — D638 Anemia in other chronic diseases classified elsewhere: Secondary | ICD-10-CM | POA: Diagnosis not present

## 2018-02-01 DIAGNOSIS — N186 End stage renal disease: Secondary | ICD-10-CM | POA: Diagnosis not present

## 2018-02-01 DIAGNOSIS — Z992 Dependence on renal dialysis: Secondary | ICD-10-CM | POA: Diagnosis not present

## 2018-02-01 LAB — CBC
HCT: 33.1 % — ABNORMAL LOW (ref 36.0–46.0)
Hemoglobin: 10.1 g/dL — ABNORMAL LOW (ref 12.0–15.0)
MCH: 28.5 pg (ref 26.0–34.0)
MCHC: 30.5 g/dL (ref 30.0–36.0)
MCV: 93.2 fL (ref 78.0–100.0)
PLATELETS: 178 10*3/uL (ref 150–400)
RBC: 3.55 MIL/uL — AB (ref 3.87–5.11)
RDW: 19.7 % — AB (ref 11.5–15.5)
WBC: 6.1 10*3/uL (ref 4.0–10.5)

## 2018-02-01 LAB — BASIC METABOLIC PANEL
Anion gap: 16 — ABNORMAL HIGH (ref 5–15)
BUN: 40 mg/dL — AB (ref 6–20)
CALCIUM: 9.2 mg/dL (ref 8.9–10.3)
CHLORIDE: 99 mmol/L — AB (ref 101–111)
CO2: 20 mmol/L — AB (ref 22–32)
CREATININE: 8.48 mg/dL — AB (ref 0.44–1.00)
GFR calc Af Amer: 6 mL/min — ABNORMAL LOW (ref >60)
GFR calc non Af Amer: 5 mL/min — ABNORMAL LOW (ref >60)
Glucose, Bld: 139 mg/dL — ABNORMAL HIGH (ref 65–99)
Potassium: 5.3 mmol/L — ABNORMAL HIGH (ref 3.5–5.1)
SODIUM: 135 mmol/L (ref 135–145)

## 2018-02-01 LAB — GLUCOSE, CAPILLARY
GLUCOSE-CAPILLARY: 180 mg/dL — AB (ref 65–99)
Glucose-Capillary: 159 mg/dL — ABNORMAL HIGH (ref 65–99)
Glucose-Capillary: 136 mg/dL — ABNORMAL HIGH (ref 65–99)

## 2018-02-01 MED ORDER — LIDOCAINE HCL (PF) 1 % IJ SOLN: 5.0000 mL | INTRAMUSCULAR | Status: DC | PRN

## 2018-02-01 MED ORDER — METOPROLOL SUCCINATE ER 25 MG PO TB24
12.5000 mg | ORAL_TABLET | Freq: Every day | ORAL | Status: DC
  Administered 2018-02-01: 12.5 mg via ORAL
  Filled 2018-02-01: qty 1

## 2018-02-01 MED ORDER — OXYCODONE-ACETAMINOPHEN 10-325 MG PO TABS: 1.0000 | ORAL_TABLET | Freq: Three times a day (TID) | ORAL | 0 refills | Status: DC | PRN

## 2018-02-01 MED ORDER — PENTAFLUOROPROP-TETRAFLUOROETH EX AERO: 1.0000 "application " | INHALATION_SPRAY | CUTANEOUS | Status: DC | PRN

## 2018-02-01 MED ORDER — SODIUM CHLORIDE 0.9 % IV SOLN: 100.0000 mL | INTRAVENOUS | Status: DC | PRN

## 2018-02-01 MED ORDER — LIDOCAINE-PRILOCAINE 2.5-2.5 % EX CREA: 1.0000 "application " | TOPICAL_CREAM | CUTANEOUS | Status: DC | PRN

## 2018-02-01 MED ORDER — HEPARIN SODIUM (PORCINE) 1000 UNIT/ML DIALYSIS: 6000.0000 [IU] | Freq: Once | INTRAMUSCULAR | Status: DC

## 2018-02-01 MED ORDER — SODIUM CHLORIDE 0.9 % IV SOLN: 62.5000 mg | INTRAVENOUS | Status: DC

## 2018-02-01 MED ORDER — DOXERCALCIFEROL 4 MCG/2ML IV SOLN
INTRAVENOUS | Status: AC
  Administered 2018-02-01: 5 ug
  Filled 2018-02-01: qty 2

## 2018-02-01 MED ORDER — METOPROLOL SUCCINATE ER 25 MG PO TB24: 12.5000 mg | ORAL_TABLET | Freq: Every day | ORAL | 0 refills | Status: DC

## 2018-02-01 MED ORDER — INSULIN REGULAR HUMAN 100 UNIT/ML IJ SOLN: 0.0000 [IU] | Freq: Three times a day (TID) | INTRAMUSCULAR | 11 refills | Status: DC

## 2018-02-01 MED ORDER — DARBEPOETIN ALFA 200 MCG/0.4ML IJ SOSY: 200.0000 ug | PREFILLED_SYRINGE | INTRAMUSCULAR | Status: DC

## 2018-02-01 MED ORDER — POLYETHYLENE GLYCOL 3350 17 G PO PACK: 17.0000 g | PACK | Freq: Every day | ORAL | 0 refills | Status: DC

## 2018-02-01 NOTE — Progress Notes (Signed)
Occupational Therapy Treatment Patient Details Name: Karen [name] MRN: [id number] DOB: [birth year] Today's Date: 02/01/2018    History of present illness Pt is a 42 y.o. female admitted 01/28/18 with SOB, found to have volume overload in setting of multiple missed HD sessions. PMH includes ESRD (HD TTS), HTN, CVA (2018), PAD s/p L BKA (2014), DM.       Follow Up Recommendations  SNF    Equipment Recommendations  Other (comment)(defer to SNF)    Recommendations for Other Services      Precautions / Restrictions         Mobility Bed Mobility Overal bed mobility: Needs Assistance Bed Mobility: Sit to Supine       Sit to supine: Max assist;+2 for physical assistance   General bed mobility comments: sittin EOB on arrival  Transfers                      Balance Overall balance assessment: Needs assistance Sitting-balance support: Single extremity supported Sitting balance-Leahy Scale: Fair Sitting balance - Comments: pt sitting very close to edge of bed.  decreased awareness of positioning                                   ADL either performed or assessed with clinical judgement   ADL Overall ADL's : Needs assistance/impaired                                       General ADL Comments: Pt sitting EOB upon OT arrival.  Encouraged pt to scoot back on the bed. Pt wanted OT to pull her back.  Encouraged her to perform hip walking.  OT and RN ended up Aing pt to supine with HOB raised.       Vision       Perception     Praxis      Cognition Arousal/Alertness: Awake/alert Behavior During Therapy: WFL for tasks assessed/performed;Anxious;Agitated Overall Cognitive Status: History of cognitive impairments - at baseline                                 General Comments: pt wants to leave. Explained social work in working on a place for her to go                   Pertinent Vitals/ Pain       Pain Assessment:  No/denies pain  Home Living                                          Prior Functioning/Environment              Frequency  Min 2X/week        Progress Toward Goals  OT Goals(current goals can now be found in the care plan section)  Progress towards OT goals: Progressing toward goals     Plan Discharge plan remains appropriate    Co-evaluation                 AM-PAC PT "6 Clicks" Daily Activity     Outcome Measure   Help from another person eating meals?: None Help from  another person taking care of personal grooming?: None Help from another person toileting, which includes using toliet, bedpan, or urinal?: A Lot Help from another person bathing (including washing, rinsing, drying)?: A Lot Help from another person to put on and taking off regular upper body clothing?: A Little Help from another person to put on and taking off regular lower body clothing?: A Lot 6 Click Score: 17    End of Session Equipment Utilized During Treatment: (w/c)  OT Visit Diagnosis: Unsteadiness on feet (R26.81)   Activity Tolerance Patient limited by fatigue   Patient Left in bed;with call bell/phone within reach;with nursing/sitter in room   Nurse Communication Mobility status;Precautions        Time: 0945-1000 OT Time Calculation (min): 15 min  Charges: OT General Charges $OT Visit: 1 Visit OT Treatments $Self Care/Home Management : 8-22 mins  [place], [place]   Payton Mccallum D 02/01/2018, 10:14 AM

## 2018-02-01 NOTE — Progress Notes (Signed)
PTAR notified of patient need. PTAR will be in route to transfer patient.

## 2018-02-01 NOTE — Discharge Instructions (Signed)
End-Stage Kidney Disease °End-stage kidney disease occurs when the kidneys are so damaged that they cannot do their job. The kidneys are two organs that do many important jobs in the body, which include: °· Removing wastes and extra fluids from the blood. °· Making hormones that maintain the amount of fluid in your tissues and blood vessels. °· Maintaining the right amount of fluids and chemicals in the body. ° °When the kidneys are damaged and cannot do their job, life-threatening problems occur. Without the help of the kidneys, toxins build up in the blood. In end-stage kidney disease, the kidneys cannot get better. °What are the causes? °End-stage kidney disease usually occurs when a long-lasting (chronic) kidney disease gets worse. It may also occur after the kidneys are suddenly damaged (acute kidney injury). °What increases the risk? °This condition is more likely to develop in people who are: °· Older than age 60. °· Female. °· Of African-American descent. °· Current smokers or former smokers. °· Obese. ° °You may also have an increased risk for end-stage kidney disease if you: °· Have a family history of chronic kidney disease (CKD). °· Have had kidney disease for many years. °· Have other longstanding medical conditions that affect the kidneys, such as: °? Cardiovascular disease including high blood pressure. °? Diabetes. °? Certain diseases that affect the immune system. ° °What are the signs or symptoms? °· Swelling (edema) of the face, legs, ankles, or feet. °· Numbness, tingling, or loss of feeling (sensation) in your hands or feet. °· Tiredness (lethargy). °· Nausea or vomiting. °· Confusion, trouble concentrating, or loss of consciousness. °· Chest pain. °· Shortness of breath. °· Little to no urine production. °· Muscle twitches and cramps, especially in the legs. °· Constant itchiness. °· Loss of appetite. °· Pale skin and tissue lining your eyelids (conjunctiva). °· Headaches. °· Abnormally dark or  light skin. °· Decrease in muscle size (muscle wasting). °· Easy bruising. °· Frequent hiccups. °· Stopping of menstruation in women. °· Seizures. °How is this diagnosed? °Your health care provider will measure your blood pressure and do some tests. These may include: °· Urine tests. °· Blood tests. °· Imaging tests. °· A test in which a sample of tissue is removed from the kidneys to be looked at under a microscope (kidney biopsy). ° °How is this treated? °There are two treatments for end-stage kidney disease: °· A procedure that removes toxic wastes from the body (dialysis). Depending on the type of dialysis you choose, it may be performed more than one time a day (peritoneal dialysis) or several times a week (hemodialysis). °· Surgery to receive a new kidney (kidney transplant). ° °In addition to having dialysis or a kidney transplant, you may need to take medicines: °· To control high blood pressure (hypertension). °· To control cholesterol. °· To maintain healthy electrolyte levels in your blood. ° °You may also be given a specific diet to follow that includes requirements or limits for: °· Salt (sodium). °· Protein. °· Phosphorous. °· Potassium. °· Calcium. ° °Follow these instructions at home: °· Follow your prescribed diet. °· Take over-the-counter and prescription medicines only as told by your health care provider. °? Do not take any new medicines unless approved by your health care provider. Many medicines can worsen your kidney damage. °? Do not take any vitamin and mineral supplements unless approved by your health care provider. Many nutritional supplements can worsen your kidney damage. °? The dose of some medicines that you take may need to be   adjusted. °· Do not use any tobacco products, such as cigarettes, chewing tobacco, and e-cigarettes. If you need help quitting, ask your health care provider. °· Keep all follow-up visits as told by your health care provider. This is important. °· Keep track of  your blood pressure. Report changes in your blood pressure as told by your health care provider. °· Achieve and maintain a healthy weight. If you need help with this, ask your health care provider. °· Start or continue an exercise plan. Try to exercise at least 30 minutes a day, 5 days a week. °· Stay current with immunizations as told by your health care provider. °Where to find more information: °· American Association of Kidney Patients: www.aakp.org °· National Kidney Foundation: www.kidney.org °· American Kidney Fund: www.akfinc.org °· Life Options Rehabilitation Program: www.lifeoptions.org and www.kidneyschool.org °Contact a health care provider if: °· Your symptoms get worse. °· You develop new symptoms. °Get help right away if: °· You have weakness in an arm or leg on one side of your body. °· You have difficulty speaking or you are slurring your speech. °· You have a sudden change in your vision. °· You have a sudden, severe headache. °· You have a sudden weight increase. °· You have difficulty breathing. °· Your symptoms suddenly get worse. °This information is not intended to replace advice given to you by your health care provider. Make sure you discuss any questions you have with your health care provider. °Document Released: 11/28/2003 Document Revised: 02/13/2016 Document Reviewed: 05/06/2012 °Elsevier Interactive Patient Education © 2017 Elsevier Inc. ° °

## 2018-02-01 NOTE — Progress Notes (Signed)
Family Medicine Teaching Service Daily Progress Note Intern Pager: [phone]  Patient name: Karen [name] Medical record number: [id number] Date of birth: Jun 26, 1976 Age: 42 y.o. Gender: female  Primary Care Provider: Lillard Anes, MD Consultants: Nephrology Code Status: Full  Pt Overview and Major Events to Date:  05/09 Admitted to FPTS  Assessment and Plan:  Volume overload from missed HD sessions  ESRD, improved Improved since being on dialysis.  - Nephrology consulted, appreciate recommendations - continue HD  - Daily weights - Strict I/Os  Homelessness/poor social situation Currently homeless. Concern for underlying mental illness contributing to overall poor health and non-compliance. PT/OT recommending SNF.  - awaiting SNF placement  Hypertension, uncontrolled BP past 24 hrs, 147 - 167/ 66-88.  - Appreciate nephrology recommendations, continue volume removal with HD - Hydralazine as needed for SBP >160 or DBP >110  - Amlodipine 10 mg daily  HFrEF Echo 08/2017 EF 35-40%, diffuse hypokinesis. No ace/arb due to ESRD. - will start low dose metoprolol - monitor HR, telemetry  Chronic and stable Medical conditions  History of CVA with persistent dysarthria - SLP following, dysphagia diet - cont home asa, statin  Left BKA Uses prosthesis, no pressure ulcers on stump.  - PT/OT recommending SNF  Disseminated extremity skin nodules Reportedly present on upper and lower extremities since 2004. 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  - Continue to monitor.  - May need biopsy or further treatment in outpatient setting  FEN/GI: Dysphagia 3 diet DVT PPx: Heparin  Disposition: awaiting SNF placement  Subjective:  No acute events overnight. Endorses SOB, but no change from baseline. No CP.   Objective: Temp:  [98.4 F (36.9 C)-98.6 F (37 C)] 98.4 F (36.9 C) (05/14 0602) Pulse Rate:  [94-96] 94 (05/14 0602) Resp:   [18] 18 (05/14 0602) BP: (147-167)/(66-88) 147/66 (05/14 0602) SpO2:  [98 %-100 %] 100 % (05/14 0602) Physical Exam: Gen: NAD, resting comfortably CV:  RRR with no murmurs appreciated Pulm: NWOB, bronchovesicular breath sounds, no crackles GI: Normal bowel sounds present. Soft, Nontender, Nondistended. MSK: R BKA Skin: multiple calloused nodules on extremities Neuro: A&Ox3   Laboratory: Recent Labs  Lab 01/30/18 0532 01/31/18 0428 02/01/18 0637  WBC 5.3 6.2 6.1  HGB 10.7* 9.3* 10.1*  HCT 36.0 31.7* 33.1*  PLT 152 160 178   Recent Labs  Lab 01/27/18 1350  01/30/18 0532 01/31/18 0428 02/01/18 0516  NA 138   < > 140 139 135  K 4.5   < > 3.8 4.3 5.3*  CL 99*   < > 97* 97* 99*  CO2 23   < > 29 28 20*  BUN 44*   < > 19 32* 40*  CREATININE 11.36*  11.01*   < > 5.82* 7.58* 8.48*  CALCIUM 9.0   < > 9.2 9.2 9.2  PROT 7.6  --   --   --   --   BILITOT 0.6  --   --   --   --   ALKPHOS 68  --   --   --   --   ALT 17  --   --   --   --   AST 15  --   --   --   --   GLUCOSE 215*   < > 144* 165* 139*   < > = values in this interval not displayed.   Imaging/Diagnostic Tests: Dg Chest 2 View  Result Date: 01/27/2018 CLINICAL DATA:  42 y/o  F; weak all over and shortness of breath. EXAM: CHEST - 2 VIEW COMPARISON:  01/26/2018 chest radiograph FINDINGS: Stable cardiomegaly given projection and technique. Pulmonary venous hypertension and hazy opacification of the lungs. No pleural effusion or pneumothorax. No focal consolidation. Bones are unremarkable. IMPRESSION: Stable cardiomegaly. Pulmonary venous hypertension and hazy lungs probably representing mild pulmonary edema. Electronically Signed   By: Kristine Garbe M.D.   On: 01/27/2018 18:42    Bonnita Hollow, MD 02/01/2018, 9:31 AM PGY-3, [place] Intern pager: [phone], text pages welcome

## 2018-02-01 NOTE — Progress Notes (Signed)
[facility] Progress Note   Subjective: no /co today  Objective Vitals:   01/31/18 2155 01/31/18 2159 02/01/18 0602 02/01/18 0943  BP: (!) 167/74 (!) 167/74 (!) 147/66 (!) 167/88  Pulse: 96 96 94 97  Resp: 18 18 18    Temp: 98.6 F (37 C) 98.6 F (37 C) 98.4 F (36.9 C)   TempSrc: Oral Oral Oral   SpO2: 99% 98% 100% 100%  Weight:      Height:       Physical Exam General: Chronically ill appearing female in NAD Heart: S1,S2, RRR Lungs: CTAB No WOB Abdomen: Active BS Extremities: L BKA no stump edema, RLE no edema.  Dialysis Access: LUA AVG + bruit.   Additional Objective Labs: Basic Metabolic Panel: Recent Labs  Lab 01/28/18 1553  01/30/18 0532 01/31/18 0428 02/01/18 0516  NA 139   < > 140 139 135  K 4.9   < > 3.8 4.3 5.3*  CL 102   < > 97* 97* 99*  CO2 22   < > 29 28 20*  GLUCOSE 128*   < > 144* 165* 139*  BUN 49*   < > 19 32* 40*  CREATININE 12.16*   < > 5.82* 7.58* 8.48*  CALCIUM 8.8*   < > 9.2 9.2 9.2  PHOS 7.3*  --   --   --   --    < > = values in this interval not displayed.   Liver Function Tests: Recent Labs  Lab 01/27/18 1350  AST 15  ALT 17  ALKPHOS 68  BILITOT 0.6  PROT 7.6  ALBUMIN 3.5   No results for input(s): LIPASE, AMYLASE in the last 168 hours. CBC: Recent Labs  Lab 01/28/18 1553 01/29/18 0854 01/30/18 0532 01/31/18 0428 02/01/18 0637  WBC 4.8 4.9 5.3 6.2 6.1  HGB 9.2* 9.8* 10.7* 9.3* 10.1*  HCT 29.4* 32.7* 36.0 31.7* 33.1*  MCV 88.8 91.3 92.1 92.4 93.2  PLT 175 159 152 160 178   Blood Culture    Component Value Date/Time   SDES BLOOD RIGHT ANTECUBITAL 09/07/2017 1830   SPECREQUEST IN PEDIATRIC BOTTLE Blood Culture adequate volume 09/07/2017 1830   CULT NO GROWTH 5 DAYS 09/07/2017 1830   REPTSTATUS 09/12/2017 FINAL 09/07/2017 1830    Cardiac Enzymes: Recent Labs  Lab 01/27/18 1350  CKTOTAL 44   CBG: Recent Labs  Lab 01/31/18 0809 01/31/18 1236 01/31/18 1630 01/31/18 2151 02/01/18 0737   GLUCAP 132* 219* 159* 234* 180*   Iron Studies: No results for input(s): IRON, TIBC, TRANSFERRIN, FERRITIN in the last 72 hours. @lablastinr3 @ Studies/Results: No results found. Medications: . [START ON 02/03/2018] ferric gluconate (FERRLECIT/NULECIT) IV     . acetaminophen  650 mg Oral Once  . amLODipine  10 mg Oral QHS  . atorvastatin  80 mg Oral q1800  . [START ON 02/03/2018] darbepoetin (ARANESP) injection - DIALYSIS  200 mcg Intravenous Q Thu-HD  . doxercalciferol  5 mcg Intravenous Q T,Th,Sa-HD  . heparin  5,000 Units Subcutaneous Q8H  . hydrALAZINE  10 mg Oral Q6H  . insulin regular  0-9 Units Subcutaneous TID WC  . metoprolol succinate  12.5 mg Oral Daily  . multivitamin  1 tablet Oral QHS  . polyethylene glycol  17 g Oral Daily     Dialysis Orders: Center:[facility]. EDW91kgHD Bath 2k,2.25 ca4 Hrs -Heparin 6000 unit IV initial bolus, Heparin 2000 units IV mid run. -Mircera 221mcg IV q 2wks (last on 5/07) -Venofer 50mg  IV weekly  Problem/Plan 1. Volume overload 2/2 noncompliance with HD - recurrent issue, resolved now after serial HD.  2. ESRD -TTS via LUA AVG 3. 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 Recent ESA dose. Follow HGB. Continue weekly Fe.  5. Metabolic bone disease -Ca 9.2 C ca  Phos 7.3  6. Nutrition -Albumin 3.5. DYS 3 diet, renal vit, nepro.  7. CVA - speech deficits - Pt, ot, Speechcurrent eval ( dysphagia 3 diet to avoid aspiration)May need rehab, in past was in [facility] / needs SW / CM In put for homelessnesssituation 8. HO L BKA ( Has Prosthesis)- PT/ OT seeing 9. Disposition- awaiting DC to SNF.       Kelly Splinter MD [employer] pager [phone]   02/01/2018, 10:31 AM

## 2018-02-01 NOTE — Social Work (Addendum)
CSW discussed barrier to placement with Dr. Jonnie Finner, pt is able to switch to a MWFr schedule.  Pt able to discharge today to SNF ([facility]), they will coordinate dialysis transportation.  Pt also given information about transportation and housing in the [facility] area in case LTC placement/housing becomes challenge.  Family Medicine TS paged, await discharge summary.   12:31pm- Delay in verification of benefits for SNF, pt name listed on facesheet does not match information for Medicare/Medicaid benefits, pt does not have these cards available. Pt tells this Probation officer that benefits are listed under the last name of "Kreeger," CSW sent this to SNF to look at benefits again.   1:55pm- Pt will discharge after dialysis today. Clinical Social Worker facilitated patient discharge including contacting patient family and facility to confirm patient discharge plans.   Clinical information faxed to facility and family agreeable with plan.    RN to arrange transport via [facility] to [place]- for [facility] after hours # is [phone] (select option 1 for English and then option 3 for dispatch)  RN to call [phone] with report  prior to discharge.  Clinical Social Worker will sign off for now as social work intervention is no longer needed. Please consult [place] again if new need arises.  Alexander Mt, LCSWA Clinical Social Worker [phone]

## 2018-02-01 NOTE — Clinical Social Work Placement (Signed)
   CLINICAL SOCIAL WORK PLACEMENT  NOTE Genesis Dayna Ramus RN to call report to [phone]  Date:  02/01/2018  Patient Details  Name: Karen [name] MRN: [id number] Date of Birth: Sep 10, 1976  Clinical Social Work is seeking post-discharge placement for this patient at the [place] level of care (*CSW will initial, date and re-position this form in  chart as items are completed):      Patient/family provided with [place] Work Department's list of facilities offering this level of care within the geographic area requested by the patient (or if unable, by the patient's family).  Yes   Patient/family informed of their freedom to choose among providers that offer the needed level of care, that participate in Medicare, Medicaid or managed care program needed by the patient, have an available bed and are willing to accept the patient.      Patient/family informed of [facility]'s ownership interest in [facility] and [facility], as well as of the fact that they are under no obligation to receive care at these facilities.  PASRR submitted to EDS on       PASRR number received on       Existing PASRR number confirmed on 01/30/18     FL2 transmitted to all facilities in geographic area requested by pt/family on 01/30/18     FL2 transmitted to all facilities within larger geographic area on       Patient informed that his/her managed care company has contracts with or will negotiate with certain facilities, including the following:        Yes   Patient/family informed of bed offers received.  Patient chooses bed at [facility] and [place] recommends and patient chooses bed at      Patient to be transferred to [facility] on 02/01/18.  Patient to be transferred to facility by [facility]     Patient family notified on 02/01/18 of transfer.  Name of family member notified:  pt called her godmother      PHYSICIAN Please prepare prescriptions     Additional Comment:    _______________________________________________ Alexander Mt, [place] 02/01/2018, 1:58 PM

## 2018-02-01 NOTE — Progress Notes (Signed)
Report was called and given to [facility]. Patient is currently in dialysis and facility made aware. [facility] will be notified of needed transport upon her return to the unit.

## 2018-02-01 NOTE — Clinical Social Work Note (Signed)
Clinical Social Work Assessment  Patient Details  Name: Karen [name] MRN: [id number] Date of Birth: 1976-01-04  Date of referral:  02/01/18               Reason for consult:  Facility Placement, Housing Concerns/Homelessness, Discharge Planning                Permission sought to share information with:  Family Supports, Chartered certified accountant granted to share information::  Yes, Verbal Permission Granted  Name::     Regulatory affairs officer::  SNFs  Relationship::  godmother  Contact Information:     Housing/Transportation Living arrangements for the past 2 months:  Single Family Home Source of Information:  Patient Patient Interpreter Needed:  None Criminal Activity/Legal Involvement Pertinent to Current Situation/Hospitalization:  No - Comment as needed Significant Relationships:  Other Family Members Lives with:  Relatives Do you feel safe going back to the place where you live?  No(not secure housing. ) Need for family participation in patient care:  Yes (Comment)  Care giving concerns: Pt has transient housing and support from family. She has lived with multiple families members. Not compliant with her dialysis due to lack of transportation/stable housing. Pt requires assistance with ADLs.    Social Worker assessment / plan:  CSW met with pt at bedside, pt appears restless stating she is ready to leave. CSW explained role, SNF search and barriers to placement. Pt states understanding. [place] family members in [place]. Pt indicates her godmother is her main source of support, pt also indicates that she has been followed by APS workers from [facility] and has four children, pt did not state where the children are currently. Pt indicates she is okay with SNF anywhere and just wants to discharge. CSW again spoke about barriers to discharge.   CSW aware that pt facility offer only transports to  dialysis on MWFr and pt is currently on TThSa schedule. CSW spoke with MD Dr. Jonnie Finner about changing the schedule, he indicates that they are able to change her to MWFr at discharge.    Employment status:  Unemployed Forensic scientist:  Medicaid In [place], [place] PT Recommendations:  [place], [place] / Referral to community resources:  APS (Comment Required: [place], Name & Number of worker spoken with), [place]  Patient/Family's Response to care:  Pt states understanding of placement limitations, states eagerness to discharge and leave hospital.   Patient/Family's Understanding of and Emotional Response to Diagnosis, Current Treatment, and Prognosis:  Pt states understanding of diagnosis, current treatment, and prognosis. Pt seems to grasp importance of going to dialysis and maintaining her health. CSW will provide pt with transportation information as well as the information about facility.   Emotional Assessment Appearance:  Appears older than stated age, Disheveled Attitude/Demeanor/Rapport:  Complaining Affect (typically observed):  Anxious, Restless Orientation:  Oriented to Self, Oriented to Place, Oriented to  Time, Oriented to Situation(delayed responses) Alcohol / Substance use:    Psych involvement (Current and /or in the community):  No (Comment)  Discharge Needs  Concerns to be addressed:  Discharge Planning Concerns, Care Coordination, Home Safety Concerns Readmission within the last 30 days:  No Current discharge risk:    Barriers to Discharge:  No SNF bed, Waiting for outpatient dialysis   [place] 02/01/2018, 11:35 AM

## 2018-02-04 DIAGNOSIS — N186 End stage renal disease: Secondary | ICD-10-CM | POA: Diagnosis not present

## 2018-02-04 DIAGNOSIS — N2581 Secondary hyperparathyroidism of renal origin: Secondary | ICD-10-CM | POA: Diagnosis not present

## 2018-02-04 DIAGNOSIS — D509 Iron deficiency anemia, unspecified: Secondary | ICD-10-CM | POA: Diagnosis not present

## 2018-02-04 DIAGNOSIS — D631 Anemia in chronic kidney disease: Secondary | ICD-10-CM | POA: Diagnosis not present

## 2018-02-07 DIAGNOSIS — D631 Anemia in chronic kidney disease: Secondary | ICD-10-CM | POA: Diagnosis not present

## 2018-02-07 DIAGNOSIS — N2581 Secondary hyperparathyroidism of renal origin: Secondary | ICD-10-CM | POA: Diagnosis not present

## 2018-02-07 DIAGNOSIS — D509 Iron deficiency anemia, unspecified: Secondary | ICD-10-CM | POA: Diagnosis not present

## 2018-02-07 DIAGNOSIS — N186 End stage renal disease: Secondary | ICD-10-CM | POA: Diagnosis not present

## 2018-02-09 DIAGNOSIS — D631 Anemia in chronic kidney disease: Secondary | ICD-10-CM | POA: Diagnosis not present

## 2018-02-09 DIAGNOSIS — N2581 Secondary hyperparathyroidism of renal origin: Secondary | ICD-10-CM | POA: Diagnosis not present

## 2018-02-09 DIAGNOSIS — D509 Iron deficiency anemia, unspecified: Secondary | ICD-10-CM | POA: Diagnosis not present

## 2018-02-09 DIAGNOSIS — N186 End stage renal disease: Secondary | ICD-10-CM | POA: Diagnosis not present

## 2018-02-10 DIAGNOSIS — D638 Anemia in other chronic diseases classified elsewhere: Secondary | ICD-10-CM | POA: Diagnosis not present

## 2018-02-10 DIAGNOSIS — I10 Essential (primary) hypertension: Secondary | ICD-10-CM | POA: Diagnosis not present

## 2018-02-10 DIAGNOSIS — R471 Dysarthria and anarthria: Secondary | ICD-10-CM | POA: Diagnosis not present

## 2018-02-10 DIAGNOSIS — E119 Type 2 diabetes mellitus without complications: Secondary | ICD-10-CM | POA: Diagnosis not present

## 2018-02-11 DIAGNOSIS — D631 Anemia in chronic kidney disease: Secondary | ICD-10-CM | POA: Diagnosis not present

## 2018-02-11 DIAGNOSIS — I10 Essential (primary) hypertension: Secondary | ICD-10-CM | POA: Diagnosis not present

## 2018-02-11 DIAGNOSIS — E119 Type 2 diabetes mellitus without complications: Secondary | ICD-10-CM | POA: Diagnosis not present

## 2018-02-11 DIAGNOSIS — D509 Iron deficiency anemia, unspecified: Secondary | ICD-10-CM | POA: Diagnosis not present

## 2018-02-11 DIAGNOSIS — N2581 Secondary hyperparathyroidism of renal origin: Secondary | ICD-10-CM | POA: Diagnosis not present

## 2018-02-11 DIAGNOSIS — N186 End stage renal disease: Secondary | ICD-10-CM | POA: Diagnosis not present

## 2018-02-11 DIAGNOSIS — R471 Dysarthria and anarthria: Secondary | ICD-10-CM | POA: Diagnosis not present

## 2018-02-16 DIAGNOSIS — N186 End stage renal disease: Secondary | ICD-10-CM | POA: Diagnosis not present

## 2018-02-16 DIAGNOSIS — N2581 Secondary hyperparathyroidism of renal origin: Secondary | ICD-10-CM | POA: Diagnosis not present

## 2018-02-16 DIAGNOSIS — D509 Iron deficiency anemia, unspecified: Secondary | ICD-10-CM | POA: Diagnosis not present

## 2018-02-16 DIAGNOSIS — D631 Anemia in chronic kidney disease: Secondary | ICD-10-CM | POA: Diagnosis not present

## 2018-02-18 DIAGNOSIS — N186 End stage renal disease: Secondary | ICD-10-CM | POA: Diagnosis not present

## 2018-02-18 DIAGNOSIS — D631 Anemia in chronic kidney disease: Secondary | ICD-10-CM | POA: Diagnosis not present

## 2018-02-18 DIAGNOSIS — N2581 Secondary hyperparathyroidism of renal origin: Secondary | ICD-10-CM | POA: Diagnosis not present

## 2018-02-18 DIAGNOSIS — D509 Iron deficiency anemia, unspecified: Secondary | ICD-10-CM | POA: Diagnosis not present

## 2018-02-19 DIAGNOSIS — Z992 Dependence on renal dialysis: Secondary | ICD-10-CM | POA: Diagnosis not present

## 2018-02-19 DIAGNOSIS — E1122 Type 2 diabetes mellitus with diabetic chronic kidney disease: Secondary | ICD-10-CM | POA: Diagnosis not present

## 2018-02-19 DIAGNOSIS — N186 End stage renal disease: Secondary | ICD-10-CM | POA: Diagnosis not present

## 2018-02-21 DIAGNOSIS — E1129 Type 2 diabetes mellitus with other diabetic kidney complication: Secondary | ICD-10-CM | POA: Diagnosis not present

## 2018-02-21 DIAGNOSIS — N186 End stage renal disease: Secondary | ICD-10-CM | POA: Diagnosis not present

## 2018-02-21 DIAGNOSIS — D509 Iron deficiency anemia, unspecified: Secondary | ICD-10-CM | POA: Diagnosis not present

## 2018-02-21 DIAGNOSIS — D631 Anemia in chronic kidney disease: Secondary | ICD-10-CM | POA: Diagnosis not present

## 2018-02-21 DIAGNOSIS — N2581 Secondary hyperparathyroidism of renal origin: Secondary | ICD-10-CM | POA: Diagnosis not present

## 2018-02-23 DIAGNOSIS — E119 Type 2 diabetes mellitus without complications: Secondary | ICD-10-CM | POA: Diagnosis not present

## 2018-02-23 DIAGNOSIS — I10 Essential (primary) hypertension: Secondary | ICD-10-CM | POA: Diagnosis not present

## 2018-02-23 DIAGNOSIS — L989 Disorder of the skin and subcutaneous tissue, unspecified: Secondary | ICD-10-CM | POA: Diagnosis not present

## 2018-02-23 DIAGNOSIS — N186 End stage renal disease: Secondary | ICD-10-CM | POA: Diagnosis not present

## 2018-02-24 DIAGNOSIS — N2581 Secondary hyperparathyroidism of renal origin: Secondary | ICD-10-CM | POA: Diagnosis not present

## 2018-02-24 DIAGNOSIS — N186 End stage renal disease: Secondary | ICD-10-CM | POA: Diagnosis not present

## 2018-02-24 DIAGNOSIS — E1129 Type 2 diabetes mellitus with other diabetic kidney complication: Secondary | ICD-10-CM | POA: Diagnosis not present

## 2018-02-24 DIAGNOSIS — D631 Anemia in chronic kidney disease: Secondary | ICD-10-CM | POA: Diagnosis not present

## 2018-02-24 DIAGNOSIS — D509 Iron deficiency anemia, unspecified: Secondary | ICD-10-CM | POA: Diagnosis not present

## 2018-02-28 DIAGNOSIS — N186 End stage renal disease: Secondary | ICD-10-CM | POA: Diagnosis not present

## 2018-02-28 DIAGNOSIS — E1129 Type 2 diabetes mellitus with other diabetic kidney complication: Secondary | ICD-10-CM | POA: Diagnosis not present

## 2018-02-28 DIAGNOSIS — D509 Iron deficiency anemia, unspecified: Secondary | ICD-10-CM | POA: Diagnosis not present

## 2018-02-28 DIAGNOSIS — D631 Anemia in chronic kidney disease: Secondary | ICD-10-CM | POA: Diagnosis not present

## 2018-02-28 DIAGNOSIS — N2581 Secondary hyperparathyroidism of renal origin: Secondary | ICD-10-CM | POA: Diagnosis not present

## 2018-03-03 DIAGNOSIS — E1129 Type 2 diabetes mellitus with other diabetic kidney complication: Secondary | ICD-10-CM | POA: Diagnosis not present

## 2018-03-03 DIAGNOSIS — D631 Anemia in chronic kidney disease: Secondary | ICD-10-CM | POA: Diagnosis not present

## 2018-03-03 DIAGNOSIS — N2581 Secondary hyperparathyroidism of renal origin: Secondary | ICD-10-CM | POA: Diagnosis not present

## 2018-03-03 DIAGNOSIS — D509 Iron deficiency anemia, unspecified: Secondary | ICD-10-CM | POA: Diagnosis not present

## 2018-03-03 DIAGNOSIS — N186 End stage renal disease: Secondary | ICD-10-CM | POA: Diagnosis not present

## 2018-03-04 DIAGNOSIS — E1129 Type 2 diabetes mellitus with other diabetic kidney complication: Secondary | ICD-10-CM | POA: Diagnosis not present

## 2018-03-04 DIAGNOSIS — I10 Essential (primary) hypertension: Secondary | ICD-10-CM | POA: Diagnosis not present

## 2018-03-04 DIAGNOSIS — L989 Disorder of the skin and subcutaneous tissue, unspecified: Secondary | ICD-10-CM | POA: Diagnosis not present

## 2018-03-04 DIAGNOSIS — N2581 Secondary hyperparathyroidism of renal origin: Secondary | ICD-10-CM | POA: Diagnosis not present

## 2018-03-04 DIAGNOSIS — R471 Dysarthria and anarthria: Secondary | ICD-10-CM | POA: Diagnosis not present

## 2018-03-04 DIAGNOSIS — N186 End stage renal disease: Secondary | ICD-10-CM | POA: Diagnosis not present

## 2018-03-04 DIAGNOSIS — D509 Iron deficiency anemia, unspecified: Secondary | ICD-10-CM | POA: Diagnosis not present

## 2018-03-04 DIAGNOSIS — D631 Anemia in chronic kidney disease: Secondary | ICD-10-CM | POA: Diagnosis not present

## 2018-03-07 DIAGNOSIS — N2581 Secondary hyperparathyroidism of renal origin: Secondary | ICD-10-CM | POA: Diagnosis not present

## 2018-03-07 DIAGNOSIS — N186 End stage renal disease: Secondary | ICD-10-CM | POA: Diagnosis not present

## 2018-03-07 DIAGNOSIS — E1129 Type 2 diabetes mellitus with other diabetic kidney complication: Secondary | ICD-10-CM | POA: Diagnosis not present

## 2018-03-07 DIAGNOSIS — D631 Anemia in chronic kidney disease: Secondary | ICD-10-CM | POA: Diagnosis not present

## 2018-03-07 DIAGNOSIS — D509 Iron deficiency anemia, unspecified: Secondary | ICD-10-CM | POA: Diagnosis not present

## 2018-03-09 DIAGNOSIS — N186 End stage renal disease: Secondary | ICD-10-CM | POA: Diagnosis not present

## 2018-03-09 DIAGNOSIS — D509 Iron deficiency anemia, unspecified: Secondary | ICD-10-CM | POA: Diagnosis not present

## 2018-03-09 DIAGNOSIS — N2581 Secondary hyperparathyroidism of renal origin: Secondary | ICD-10-CM | POA: Diagnosis not present

## 2018-03-09 DIAGNOSIS — D631 Anemia in chronic kidney disease: Secondary | ICD-10-CM | POA: Diagnosis not present

## 2018-03-09 DIAGNOSIS — E1129 Type 2 diabetes mellitus with other diabetic kidney complication: Secondary | ICD-10-CM | POA: Diagnosis not present

## 2018-03-11 DIAGNOSIS — E1129 Type 2 diabetes mellitus with other diabetic kidney complication: Secondary | ICD-10-CM | POA: Diagnosis not present

## 2018-03-11 DIAGNOSIS — N186 End stage renal disease: Secondary | ICD-10-CM | POA: Diagnosis not present

## 2018-03-11 DIAGNOSIS — N2581 Secondary hyperparathyroidism of renal origin: Secondary | ICD-10-CM | POA: Diagnosis not present

## 2018-03-11 DIAGNOSIS — D631 Anemia in chronic kidney disease: Secondary | ICD-10-CM | POA: Diagnosis not present

## 2018-03-11 DIAGNOSIS — D509 Iron deficiency anemia, unspecified: Secondary | ICD-10-CM | POA: Diagnosis not present

## 2018-03-14 DIAGNOSIS — D631 Anemia in chronic kidney disease: Secondary | ICD-10-CM | POA: Diagnosis not present

## 2018-03-14 DIAGNOSIS — N186 End stage renal disease: Secondary | ICD-10-CM | POA: Diagnosis not present

## 2018-03-14 DIAGNOSIS — E1129 Type 2 diabetes mellitus with other diabetic kidney complication: Secondary | ICD-10-CM | POA: Diagnosis not present

## 2018-03-14 DIAGNOSIS — N2581 Secondary hyperparathyroidism of renal origin: Secondary | ICD-10-CM | POA: Diagnosis not present

## 2018-03-14 DIAGNOSIS — D509 Iron deficiency anemia, unspecified: Secondary | ICD-10-CM | POA: Diagnosis not present

## 2018-03-17 DIAGNOSIS — N186 End stage renal disease: Secondary | ICD-10-CM | POA: Diagnosis not present

## 2018-03-17 DIAGNOSIS — F4323 Adjustment disorder with mixed anxiety and depressed mood: Secondary | ICD-10-CM | POA: Diagnosis not present

## 2018-03-17 DIAGNOSIS — S88112D Complete traumatic amputation at level between knee and ankle, left lower leg, subsequent encounter: Secondary | ICD-10-CM | POA: Diagnosis not present

## 2018-03-18 DIAGNOSIS — D631 Anemia in chronic kidney disease: Secondary | ICD-10-CM | POA: Diagnosis not present

## 2018-03-18 DIAGNOSIS — K5900 Constipation, unspecified: Secondary | ICD-10-CM | POA: Diagnosis not present

## 2018-03-18 DIAGNOSIS — N2581 Secondary hyperparathyroidism of renal origin: Secondary | ICD-10-CM | POA: Diagnosis not present

## 2018-03-18 DIAGNOSIS — D509 Iron deficiency anemia, unspecified: Secondary | ICD-10-CM | POA: Diagnosis not present

## 2018-03-18 DIAGNOSIS — E119 Type 2 diabetes mellitus without complications: Secondary | ICD-10-CM | POA: Diagnosis not present

## 2018-03-18 DIAGNOSIS — G4700 Insomnia, unspecified: Secondary | ICD-10-CM | POA: Diagnosis not present

## 2018-03-18 DIAGNOSIS — N186 End stage renal disease: Secondary | ICD-10-CM | POA: Diagnosis not present

## 2018-03-18 DIAGNOSIS — E1129 Type 2 diabetes mellitus with other diabetic kidney complication: Secondary | ICD-10-CM | POA: Diagnosis not present

## 2018-03-18 DIAGNOSIS — I10 Essential (primary) hypertension: Secondary | ICD-10-CM | POA: Diagnosis not present

## 2018-03-21 DIAGNOSIS — Z992 Dependence on renal dialysis: Secondary | ICD-10-CM | POA: Diagnosis not present

## 2018-03-21 DIAGNOSIS — E1122 Type 2 diabetes mellitus with diabetic chronic kidney disease: Secondary | ICD-10-CM | POA: Diagnosis not present

## 2018-03-21 DIAGNOSIS — N186 End stage renal disease: Secondary | ICD-10-CM | POA: Diagnosis not present

## 2018-03-23 DIAGNOSIS — N186 End stage renal disease: Secondary | ICD-10-CM | POA: Diagnosis not present

## 2018-03-23 DIAGNOSIS — N2581 Secondary hyperparathyroidism of renal origin: Secondary | ICD-10-CM | POA: Diagnosis not present

## 2018-03-23 DIAGNOSIS — D509 Iron deficiency anemia, unspecified: Secondary | ICD-10-CM | POA: Diagnosis not present

## 2018-03-23 DIAGNOSIS — E1129 Type 2 diabetes mellitus with other diabetic kidney complication: Secondary | ICD-10-CM | POA: Diagnosis not present

## 2018-03-23 DIAGNOSIS — D631 Anemia in chronic kidney disease: Secondary | ICD-10-CM | POA: Diagnosis not present

## 2018-03-25 DIAGNOSIS — E1129 Type 2 diabetes mellitus with other diabetic kidney complication: Secondary | ICD-10-CM | POA: Diagnosis not present

## 2018-03-25 DIAGNOSIS — D509 Iron deficiency anemia, unspecified: Secondary | ICD-10-CM | POA: Diagnosis not present

## 2018-03-25 DIAGNOSIS — N186 End stage renal disease: Secondary | ICD-10-CM | POA: Diagnosis not present

## 2018-03-25 DIAGNOSIS — N2581 Secondary hyperparathyroidism of renal origin: Secondary | ICD-10-CM | POA: Diagnosis not present

## 2018-03-25 DIAGNOSIS — D631 Anemia in chronic kidney disease: Secondary | ICD-10-CM | POA: Diagnosis not present

## 2018-03-30 DIAGNOSIS — N186 End stage renal disease: Secondary | ICD-10-CM | POA: Diagnosis not present

## 2018-03-30 DIAGNOSIS — E1129 Type 2 diabetes mellitus with other diabetic kidney complication: Secondary | ICD-10-CM | POA: Diagnosis not present

## 2018-03-30 DIAGNOSIS — D631 Anemia in chronic kidney disease: Secondary | ICD-10-CM | POA: Diagnosis not present

## 2018-03-30 DIAGNOSIS — N2581 Secondary hyperparathyroidism of renal origin: Secondary | ICD-10-CM | POA: Diagnosis not present

## 2018-03-30 DIAGNOSIS — D509 Iron deficiency anemia, unspecified: Secondary | ICD-10-CM | POA: Diagnosis not present

## 2018-04-01 DIAGNOSIS — N2581 Secondary hyperparathyroidism of renal origin: Secondary | ICD-10-CM | POA: Diagnosis not present

## 2018-04-01 DIAGNOSIS — N186 End stage renal disease: Secondary | ICD-10-CM | POA: Diagnosis not present

## 2018-04-01 DIAGNOSIS — E1129 Type 2 diabetes mellitus with other diabetic kidney complication: Secondary | ICD-10-CM | POA: Diagnosis not present

## 2018-04-01 DIAGNOSIS — F331 Major depressive disorder, recurrent, moderate: Secondary | ICD-10-CM | POA: Diagnosis not present

## 2018-04-01 DIAGNOSIS — D631 Anemia in chronic kidney disease: Secondary | ICD-10-CM | POA: Diagnosis not present

## 2018-04-01 DIAGNOSIS — D509 Iron deficiency anemia, unspecified: Secondary | ICD-10-CM | POA: Diagnosis not present

## 2018-04-04 DIAGNOSIS — D631 Anemia in chronic kidney disease: Secondary | ICD-10-CM | POA: Diagnosis not present

## 2018-04-04 DIAGNOSIS — D509 Iron deficiency anemia, unspecified: Secondary | ICD-10-CM | POA: Diagnosis not present

## 2018-04-04 DIAGNOSIS — N2581 Secondary hyperparathyroidism of renal origin: Secondary | ICD-10-CM | POA: Diagnosis not present

## 2018-04-04 DIAGNOSIS — N186 End stage renal disease: Secondary | ICD-10-CM | POA: Diagnosis not present

## 2018-04-04 DIAGNOSIS — E1129 Type 2 diabetes mellitus with other diabetic kidney complication: Secondary | ICD-10-CM | POA: Diagnosis not present

## 2018-04-05 DIAGNOSIS — I10 Essential (primary) hypertension: Secondary | ICD-10-CM | POA: Diagnosis not present

## 2018-04-05 DIAGNOSIS — R110 Nausea: Secondary | ICD-10-CM | POA: Diagnosis not present

## 2018-04-05 DIAGNOSIS — M5430 Sciatica, unspecified side: Secondary | ICD-10-CM | POA: Diagnosis not present

## 2018-04-05 DIAGNOSIS — R05 Cough: Secondary | ICD-10-CM | POA: Diagnosis not present

## 2018-04-06 DIAGNOSIS — E1129 Type 2 diabetes mellitus with other diabetic kidney complication: Secondary | ICD-10-CM | POA: Diagnosis not present

## 2018-04-06 DIAGNOSIS — D509 Iron deficiency anemia, unspecified: Secondary | ICD-10-CM | POA: Diagnosis not present

## 2018-04-06 DIAGNOSIS — D631 Anemia in chronic kidney disease: Secondary | ICD-10-CM | POA: Diagnosis not present

## 2018-04-06 DIAGNOSIS — N2581 Secondary hyperparathyroidism of renal origin: Secondary | ICD-10-CM | POA: Diagnosis not present

## 2018-04-06 DIAGNOSIS — N186 End stage renal disease: Secondary | ICD-10-CM | POA: Diagnosis not present

## 2018-04-07 DIAGNOSIS — G4700 Insomnia, unspecified: Secondary | ICD-10-CM | POA: Diagnosis not present

## 2018-04-07 DIAGNOSIS — F4323 Adjustment disorder with mixed anxiety and depressed mood: Secondary | ICD-10-CM | POA: Diagnosis not present

## 2018-04-08 DIAGNOSIS — D631 Anemia in chronic kidney disease: Secondary | ICD-10-CM | POA: Diagnosis not present

## 2018-04-08 DIAGNOSIS — I10 Essential (primary) hypertension: Secondary | ICD-10-CM | POA: Diagnosis not present

## 2018-04-08 DIAGNOSIS — D509 Iron deficiency anemia, unspecified: Secondary | ICD-10-CM | POA: Diagnosis not present

## 2018-04-08 DIAGNOSIS — D649 Anemia, unspecified: Secondary | ICD-10-CM | POA: Diagnosis not present

## 2018-04-08 DIAGNOSIS — N186 End stage renal disease: Secondary | ICD-10-CM | POA: Diagnosis not present

## 2018-04-08 DIAGNOSIS — N2581 Secondary hyperparathyroidism of renal origin: Secondary | ICD-10-CM | POA: Diagnosis not present

## 2018-04-08 DIAGNOSIS — F331 Major depressive disorder, recurrent, moderate: Secondary | ICD-10-CM | POA: Diagnosis not present

## 2018-04-08 DIAGNOSIS — E1129 Type 2 diabetes mellitus with other diabetic kidney complication: Secondary | ICD-10-CM | POA: Diagnosis not present

## 2018-04-10 DIAGNOSIS — I10 Essential (primary) hypertension: Secondary | ICD-10-CM | POA: Diagnosis not present

## 2018-04-10 DIAGNOSIS — D638 Anemia in other chronic diseases classified elsewhere: Secondary | ICD-10-CM | POA: Diagnosis not present

## 2018-04-10 DIAGNOSIS — R471 Dysarthria and anarthria: Secondary | ICD-10-CM | POA: Diagnosis not present

## 2018-04-10 DIAGNOSIS — E119 Type 2 diabetes mellitus without complications: Secondary | ICD-10-CM | POA: Diagnosis not present

## 2018-04-11 DIAGNOSIS — N2581 Secondary hyperparathyroidism of renal origin: Secondary | ICD-10-CM | POA: Diagnosis not present

## 2018-04-11 DIAGNOSIS — D509 Iron deficiency anemia, unspecified: Secondary | ICD-10-CM | POA: Diagnosis not present

## 2018-04-11 DIAGNOSIS — N186 End stage renal disease: Secondary | ICD-10-CM | POA: Diagnosis not present

## 2018-04-11 DIAGNOSIS — D631 Anemia in chronic kidney disease: Secondary | ICD-10-CM | POA: Diagnosis not present

## 2018-04-11 DIAGNOSIS — E1129 Type 2 diabetes mellitus with other diabetic kidney complication: Secondary | ICD-10-CM | POA: Diagnosis not present

## 2018-04-13 DIAGNOSIS — N186 End stage renal disease: Secondary | ICD-10-CM | POA: Diagnosis not present

## 2018-04-13 DIAGNOSIS — D509 Iron deficiency anemia, unspecified: Secondary | ICD-10-CM | POA: Diagnosis not present

## 2018-04-13 DIAGNOSIS — D631 Anemia in chronic kidney disease: Secondary | ICD-10-CM | POA: Diagnosis not present

## 2018-04-13 DIAGNOSIS — E1129 Type 2 diabetes mellitus with other diabetic kidney complication: Secondary | ICD-10-CM | POA: Diagnosis not present

## 2018-04-13 DIAGNOSIS — N2581 Secondary hyperparathyroidism of renal origin: Secondary | ICD-10-CM | POA: Diagnosis not present

## 2018-04-14 DIAGNOSIS — M5430 Sciatica, unspecified side: Secondary | ICD-10-CM | POA: Diagnosis not present

## 2018-04-14 DIAGNOSIS — R05 Cough: Secondary | ICD-10-CM | POA: Diagnosis not present

## 2018-04-14 DIAGNOSIS — G8929 Other chronic pain: Secondary | ICD-10-CM | POA: Diagnosis not present

## 2018-04-14 DIAGNOSIS — M545 Low back pain: Secondary | ICD-10-CM | POA: Diagnosis not present

## 2018-04-15 DIAGNOSIS — F331 Major depressive disorder, recurrent, moderate: Secondary | ICD-10-CM | POA: Diagnosis not present

## 2018-04-15 DIAGNOSIS — N186 End stage renal disease: Secondary | ICD-10-CM | POA: Diagnosis not present

## 2018-04-15 DIAGNOSIS — D631 Anemia in chronic kidney disease: Secondary | ICD-10-CM | POA: Diagnosis not present

## 2018-04-15 DIAGNOSIS — D509 Iron deficiency anemia, unspecified: Secondary | ICD-10-CM | POA: Diagnosis not present

## 2018-04-15 DIAGNOSIS — N2581 Secondary hyperparathyroidism of renal origin: Secondary | ICD-10-CM | POA: Diagnosis not present

## 2018-04-15 DIAGNOSIS — F411 Generalized anxiety disorder: Secondary | ICD-10-CM | POA: Diagnosis not present

## 2018-04-15 DIAGNOSIS — E1129 Type 2 diabetes mellitus with other diabetic kidney complication: Secondary | ICD-10-CM | POA: Diagnosis not present

## 2018-04-18 DIAGNOSIS — D631 Anemia in chronic kidney disease: Secondary | ICD-10-CM | POA: Diagnosis not present

## 2018-04-18 DIAGNOSIS — N2581 Secondary hyperparathyroidism of renal origin: Secondary | ICD-10-CM | POA: Diagnosis not present

## 2018-04-18 DIAGNOSIS — N186 End stage renal disease: Secondary | ICD-10-CM | POA: Diagnosis not present

## 2018-04-18 DIAGNOSIS — E1129 Type 2 diabetes mellitus with other diabetic kidney complication: Secondary | ICD-10-CM | POA: Diagnosis not present

## 2018-04-18 DIAGNOSIS — D509 Iron deficiency anemia, unspecified: Secondary | ICD-10-CM | POA: Diagnosis not present

## 2018-04-19 DIAGNOSIS — L299 Pruritus, unspecified: Secondary | ICD-10-CM | POA: Diagnosis not present

## 2018-04-19 DIAGNOSIS — L859 Epidermal thickening, unspecified: Secondary | ICD-10-CM | POA: Diagnosis not present

## 2018-04-19 DIAGNOSIS — L309 Dermatitis, unspecified: Secondary | ICD-10-CM | POA: Diagnosis not present

## 2018-04-19 DIAGNOSIS — D485 Neoplasm of uncertain behavior of skin: Secondary | ICD-10-CM | POA: Diagnosis not present

## 2018-04-20 DIAGNOSIS — D649 Anemia, unspecified: Secondary | ICD-10-CM | POA: Diagnosis not present

## 2018-04-20 DIAGNOSIS — E119 Type 2 diabetes mellitus without complications: Secondary | ICD-10-CM | POA: Diagnosis not present

## 2018-04-20 DIAGNOSIS — Z8673 Personal history of transient ischemic attack (TIA), and cerebral infarction without residual deficits: Secondary | ICD-10-CM | POA: Diagnosis not present

## 2018-04-20 DIAGNOSIS — Z7982 Long term (current) use of aspirin: Secondary | ICD-10-CM | POA: Diagnosis not present

## 2018-04-20 DIAGNOSIS — I509 Heart failure, unspecified: Secondary | ICD-10-CM | POA: Diagnosis not present

## 2018-04-20 DIAGNOSIS — Z79899 Other long term (current) drug therapy: Secondary | ICD-10-CM | POA: Diagnosis not present

## 2018-04-20 DIAGNOSIS — E875 Hyperkalemia: Secondary | ICD-10-CM | POA: Diagnosis not present

## 2018-04-20 DIAGNOSIS — R069 Unspecified abnormalities of breathing: Secondary | ICD-10-CM | POA: Diagnosis not present

## 2018-04-20 DIAGNOSIS — I10 Essential (primary) hypertension: Secondary | ICD-10-CM | POA: Diagnosis not present

## 2018-04-20 DIAGNOSIS — Z794 Long term (current) use of insulin: Secondary | ICD-10-CM | POA: Diagnosis not present

## 2018-04-21 DIAGNOSIS — N2581 Secondary hyperparathyroidism of renal origin: Secondary | ICD-10-CM | POA: Diagnosis not present

## 2018-04-21 DIAGNOSIS — N186 End stage renal disease: Secondary | ICD-10-CM | POA: Diagnosis not present

## 2018-04-21 DIAGNOSIS — R531 Weakness: Secondary | ICD-10-CM | POA: Diagnosis not present

## 2018-04-21 DIAGNOSIS — Z992 Dependence on renal dialysis: Secondary | ICD-10-CM | POA: Diagnosis not present

## 2018-04-21 DIAGNOSIS — D509 Iron deficiency anemia, unspecified: Secondary | ICD-10-CM | POA: Diagnosis not present

## 2018-04-21 DIAGNOSIS — E1129 Type 2 diabetes mellitus with other diabetic kidney complication: Secondary | ICD-10-CM | POA: Diagnosis not present

## 2018-04-21 DIAGNOSIS — D631 Anemia in chronic kidney disease: Secondary | ICD-10-CM | POA: Diagnosis not present

## 2018-04-21 DIAGNOSIS — E1122 Type 2 diabetes mellitus with diabetic chronic kidney disease: Secondary | ICD-10-CM | POA: Diagnosis not present

## 2018-04-23 DIAGNOSIS — N2581 Secondary hyperparathyroidism of renal origin: Secondary | ICD-10-CM | POA: Diagnosis not present

## 2018-04-23 DIAGNOSIS — E1129 Type 2 diabetes mellitus with other diabetic kidney complication: Secondary | ICD-10-CM | POA: Diagnosis not present

## 2018-04-23 DIAGNOSIS — D509 Iron deficiency anemia, unspecified: Secondary | ICD-10-CM | POA: Diagnosis not present

## 2018-04-23 DIAGNOSIS — N186 End stage renal disease: Secondary | ICD-10-CM | POA: Diagnosis not present

## 2018-04-23 DIAGNOSIS — D631 Anemia in chronic kidney disease: Secondary | ICD-10-CM | POA: Diagnosis not present

## 2018-04-26 DIAGNOSIS — D631 Anemia in chronic kidney disease: Secondary | ICD-10-CM | POA: Diagnosis not present

## 2018-04-26 DIAGNOSIS — N186 End stage renal disease: Secondary | ICD-10-CM | POA: Diagnosis not present

## 2018-04-26 DIAGNOSIS — N2581 Secondary hyperparathyroidism of renal origin: Secondary | ICD-10-CM | POA: Diagnosis not present

## 2018-04-26 DIAGNOSIS — E1129 Type 2 diabetes mellitus with other diabetic kidney complication: Secondary | ICD-10-CM | POA: Diagnosis not present

## 2018-04-26 DIAGNOSIS — D509 Iron deficiency anemia, unspecified: Secondary | ICD-10-CM | POA: Diagnosis not present

## 2018-04-28 DIAGNOSIS — I10 Essential (primary) hypertension: Secondary | ICD-10-CM | POA: Diagnosis not present

## 2018-04-28 DIAGNOSIS — K5900 Constipation, unspecified: Secondary | ICD-10-CM | POA: Diagnosis not present

## 2018-04-28 DIAGNOSIS — E785 Hyperlipidemia, unspecified: Secondary | ICD-10-CM | POA: Diagnosis not present

## 2018-04-28 DIAGNOSIS — E119 Type 2 diabetes mellitus without complications: Secondary | ICD-10-CM | POA: Diagnosis not present

## 2018-04-29 DIAGNOSIS — F331 Major depressive disorder, recurrent, moderate: Secondary | ICD-10-CM | POA: Diagnosis not present

## 2018-04-29 DIAGNOSIS — N2581 Secondary hyperparathyroidism of renal origin: Secondary | ICD-10-CM | POA: Diagnosis not present

## 2018-04-29 DIAGNOSIS — D631 Anemia in chronic kidney disease: Secondary | ICD-10-CM | POA: Diagnosis not present

## 2018-04-29 DIAGNOSIS — E1129 Type 2 diabetes mellitus with other diabetic kidney complication: Secondary | ICD-10-CM | POA: Diagnosis not present

## 2018-04-29 DIAGNOSIS — N186 End stage renal disease: Secondary | ICD-10-CM | POA: Diagnosis not present

## 2018-04-29 DIAGNOSIS — D509 Iron deficiency anemia, unspecified: Secondary | ICD-10-CM | POA: Diagnosis not present

## 2018-05-02 DIAGNOSIS — D631 Anemia in chronic kidney disease: Secondary | ICD-10-CM | POA: Diagnosis not present

## 2018-05-02 DIAGNOSIS — E1129 Type 2 diabetes mellitus with other diabetic kidney complication: Secondary | ICD-10-CM | POA: Diagnosis not present

## 2018-05-02 DIAGNOSIS — D509 Iron deficiency anemia, unspecified: Secondary | ICD-10-CM | POA: Diagnosis not present

## 2018-05-02 DIAGNOSIS — N186 End stage renal disease: Secondary | ICD-10-CM | POA: Diagnosis not present

## 2018-05-02 DIAGNOSIS — N2581 Secondary hyperparathyroidism of renal origin: Secondary | ICD-10-CM | POA: Diagnosis not present

## 2018-05-03 DIAGNOSIS — I69321 Dysphasia following cerebral infarction: Secondary | ICD-10-CM | POA: Diagnosis not present

## 2018-05-03 DIAGNOSIS — E1042 Type 1 diabetes mellitus with diabetic polyneuropathy: Secondary | ICD-10-CM | POA: Diagnosis not present

## 2018-05-03 DIAGNOSIS — E782 Mixed hyperlipidemia: Secondary | ICD-10-CM | POA: Diagnosis not present

## 2018-05-03 DIAGNOSIS — I10 Essential (primary) hypertension: Secondary | ICD-10-CM | POA: Diagnosis not present

## 2018-05-03 DIAGNOSIS — I69398 Other sequelae of cerebral infarction: Secondary | ICD-10-CM | POA: Diagnosis not present

## 2018-05-03 DIAGNOSIS — Z89512 Acquired absence of left leg below knee: Secondary | ICD-10-CM | POA: Diagnosis not present

## 2018-05-03 DIAGNOSIS — N186 End stage renal disease: Secondary | ICD-10-CM | POA: Diagnosis not present

## 2018-05-03 DIAGNOSIS — I255 Ischemic cardiomyopathy: Secondary | ICD-10-CM | POA: Diagnosis not present

## 2018-05-04 DIAGNOSIS — N186 End stage renal disease: Secondary | ICD-10-CM | POA: Diagnosis not present

## 2018-05-04 DIAGNOSIS — D509 Iron deficiency anemia, unspecified: Secondary | ICD-10-CM | POA: Diagnosis not present

## 2018-05-04 DIAGNOSIS — D631 Anemia in chronic kidney disease: Secondary | ICD-10-CM | POA: Diagnosis not present

## 2018-05-04 DIAGNOSIS — E1129 Type 2 diabetes mellitus with other diabetic kidney complication: Secondary | ICD-10-CM | POA: Diagnosis not present

## 2018-05-04 DIAGNOSIS — N2581 Secondary hyperparathyroidism of renal origin: Secondary | ICD-10-CM | POA: Diagnosis not present

## 2018-05-06 DIAGNOSIS — D631 Anemia in chronic kidney disease: Secondary | ICD-10-CM | POA: Diagnosis not present

## 2018-05-06 DIAGNOSIS — N186 End stage renal disease: Secondary | ICD-10-CM | POA: Diagnosis not present

## 2018-05-06 DIAGNOSIS — E1129 Type 2 diabetes mellitus with other diabetic kidney complication: Secondary | ICD-10-CM | POA: Diagnosis not present

## 2018-05-06 DIAGNOSIS — N2581 Secondary hyperparathyroidism of renal origin: Secondary | ICD-10-CM | POA: Diagnosis not present

## 2018-05-06 DIAGNOSIS — D509 Iron deficiency anemia, unspecified: Secondary | ICD-10-CM | POA: Diagnosis not present

## 2018-05-09 DIAGNOSIS — N2581 Secondary hyperparathyroidism of renal origin: Secondary | ICD-10-CM | POA: Diagnosis not present

## 2018-05-09 DIAGNOSIS — E1129 Type 2 diabetes mellitus with other diabetic kidney complication: Secondary | ICD-10-CM | POA: Diagnosis not present

## 2018-05-09 DIAGNOSIS — D509 Iron deficiency anemia, unspecified: Secondary | ICD-10-CM | POA: Diagnosis not present

## 2018-05-09 DIAGNOSIS — N186 End stage renal disease: Secondary | ICD-10-CM | POA: Diagnosis not present

## 2018-05-09 DIAGNOSIS — D631 Anemia in chronic kidney disease: Secondary | ICD-10-CM | POA: Diagnosis not present

## 2018-05-11 DIAGNOSIS — N2581 Secondary hyperparathyroidism of renal origin: Secondary | ICD-10-CM | POA: Diagnosis not present

## 2018-05-11 DIAGNOSIS — N186 End stage renal disease: Secondary | ICD-10-CM | POA: Diagnosis not present

## 2018-05-11 DIAGNOSIS — D631 Anemia in chronic kidney disease: Secondary | ICD-10-CM | POA: Diagnosis not present

## 2018-05-11 DIAGNOSIS — D509 Iron deficiency anemia, unspecified: Secondary | ICD-10-CM | POA: Diagnosis not present

## 2018-05-11 DIAGNOSIS — E1129 Type 2 diabetes mellitus with other diabetic kidney complication: Secondary | ICD-10-CM | POA: Diagnosis not present

## 2018-05-13 DIAGNOSIS — D631 Anemia in chronic kidney disease: Secondary | ICD-10-CM | POA: Diagnosis not present

## 2018-05-13 DIAGNOSIS — N2581 Secondary hyperparathyroidism of renal origin: Secondary | ICD-10-CM | POA: Diagnosis not present

## 2018-05-13 DIAGNOSIS — E1129 Type 2 diabetes mellitus with other diabetic kidney complication: Secondary | ICD-10-CM | POA: Diagnosis not present

## 2018-05-13 DIAGNOSIS — D509 Iron deficiency anemia, unspecified: Secondary | ICD-10-CM | POA: Diagnosis not present

## 2018-05-13 DIAGNOSIS — N186 End stage renal disease: Secondary | ICD-10-CM | POA: Diagnosis not present

## 2018-05-16 DIAGNOSIS — N2581 Secondary hyperparathyroidism of renal origin: Secondary | ICD-10-CM | POA: Diagnosis not present

## 2018-05-16 DIAGNOSIS — D509 Iron deficiency anemia, unspecified: Secondary | ICD-10-CM | POA: Diagnosis not present

## 2018-05-16 DIAGNOSIS — D631 Anemia in chronic kidney disease: Secondary | ICD-10-CM | POA: Diagnosis not present

## 2018-05-16 DIAGNOSIS — E1129 Type 2 diabetes mellitus with other diabetic kidney complication: Secondary | ICD-10-CM | POA: Diagnosis not present

## 2018-05-16 DIAGNOSIS — N186 End stage renal disease: Secondary | ICD-10-CM | POA: Diagnosis not present

## 2018-05-18 DIAGNOSIS — N186 End stage renal disease: Secondary | ICD-10-CM | POA: Diagnosis not present

## 2018-05-18 DIAGNOSIS — D631 Anemia in chronic kidney disease: Secondary | ICD-10-CM | POA: Diagnosis not present

## 2018-05-18 DIAGNOSIS — E1129 Type 2 diabetes mellitus with other diabetic kidney complication: Secondary | ICD-10-CM | POA: Diagnosis not present

## 2018-05-18 DIAGNOSIS — N2581 Secondary hyperparathyroidism of renal origin: Secondary | ICD-10-CM | POA: Diagnosis not present

## 2018-05-18 DIAGNOSIS — D509 Iron deficiency anemia, unspecified: Secondary | ICD-10-CM | POA: Diagnosis not present

## 2018-05-19 DIAGNOSIS — I69398 Other sequelae of cerebral infarction: Secondary | ICD-10-CM | POA: Diagnosis not present

## 2018-05-19 DIAGNOSIS — R2689 Other abnormalities of gait and mobility: Secondary | ICD-10-CM | POA: Diagnosis not present

## 2018-05-19 DIAGNOSIS — I69321 Dysphasia following cerebral infarction: Secondary | ICD-10-CM | POA: Diagnosis not present

## 2018-05-19 DIAGNOSIS — N186 End stage renal disease: Secondary | ICD-10-CM | POA: Diagnosis not present

## 2018-05-19 DIAGNOSIS — I120 Hypertensive chronic kidney disease with stage 5 chronic kidney disease or end stage renal disease: Secondary | ICD-10-CM | POA: Diagnosis not present

## 2018-05-19 DIAGNOSIS — E1022 Type 1 diabetes mellitus with diabetic chronic kidney disease: Secondary | ICD-10-CM | POA: Diagnosis not present

## 2018-05-20 DIAGNOSIS — N186 End stage renal disease: Secondary | ICD-10-CM | POA: Diagnosis not present

## 2018-05-20 DIAGNOSIS — D509 Iron deficiency anemia, unspecified: Secondary | ICD-10-CM | POA: Diagnosis not present

## 2018-05-20 DIAGNOSIS — D631 Anemia in chronic kidney disease: Secondary | ICD-10-CM | POA: Diagnosis not present

## 2018-05-20 DIAGNOSIS — N2581 Secondary hyperparathyroidism of renal origin: Secondary | ICD-10-CM | POA: Diagnosis not present

## 2018-05-20 DIAGNOSIS — E1129 Type 2 diabetes mellitus with other diabetic kidney complication: Secondary | ICD-10-CM | POA: Diagnosis not present

## 2018-05-22 DIAGNOSIS — Z992 Dependence on renal dialysis: Secondary | ICD-10-CM | POA: Diagnosis not present

## 2018-05-22 DIAGNOSIS — E1122 Type 2 diabetes mellitus with diabetic chronic kidney disease: Secondary | ICD-10-CM | POA: Diagnosis not present

## 2018-05-22 DIAGNOSIS — N186 End stage renal disease: Secondary | ICD-10-CM | POA: Diagnosis not present

## 2018-05-25 DIAGNOSIS — R2689 Other abnormalities of gait and mobility: Secondary | ICD-10-CM | POA: Diagnosis not present

## 2018-05-25 DIAGNOSIS — I69398 Other sequelae of cerebral infarction: Secondary | ICD-10-CM | POA: Diagnosis not present

## 2018-05-25 DIAGNOSIS — I69321 Dysphasia following cerebral infarction: Secondary | ICD-10-CM | POA: Diagnosis not present

## 2018-05-25 DIAGNOSIS — E10622 Type 1 diabetes mellitus with other skin ulcer: Secondary | ICD-10-CM | POA: Diagnosis not present

## 2018-05-25 DIAGNOSIS — E1022 Type 1 diabetes mellitus with diabetic chronic kidney disease: Secondary | ICD-10-CM | POA: Diagnosis not present

## 2018-05-25 DIAGNOSIS — D631 Anemia in chronic kidney disease: Secondary | ICD-10-CM | POA: Diagnosis not present

## 2018-05-25 DIAGNOSIS — I255 Ischemic cardiomyopathy: Secondary | ICD-10-CM | POA: Diagnosis not present

## 2018-05-25 DIAGNOSIS — I10 Essential (primary) hypertension: Secondary | ICD-10-CM | POA: Diagnosis not present

## 2018-05-25 DIAGNOSIS — K5904 Chronic idiopathic constipation: Secondary | ICD-10-CM | POA: Diagnosis not present

## 2018-05-25 DIAGNOSIS — I120 Hypertensive chronic kidney disease with stage 5 chronic kidney disease or end stage renal disease: Secondary | ICD-10-CM | POA: Diagnosis not present

## 2018-05-25 DIAGNOSIS — M47816 Spondylosis without myelopathy or radiculopathy, lumbar region: Secondary | ICD-10-CM | POA: Diagnosis not present

## 2018-05-25 DIAGNOSIS — N186 End stage renal disease: Secondary | ICD-10-CM | POA: Diagnosis not present

## 2018-05-25 DIAGNOSIS — E782 Mixed hyperlipidemia: Secondary | ICD-10-CM | POA: Diagnosis not present

## 2018-05-25 DIAGNOSIS — Z89512 Acquired absence of left leg below knee: Secondary | ICD-10-CM | POA: Diagnosis not present

## 2018-05-25 DIAGNOSIS — E875 Hyperkalemia: Secondary | ICD-10-CM | POA: Diagnosis not present

## 2018-05-25 DIAGNOSIS — N2581 Secondary hyperparathyroidism of renal origin: Secondary | ICD-10-CM | POA: Diagnosis not present

## 2018-05-25 DIAGNOSIS — Z9114 Patient's other noncompliance with medication regimen: Secondary | ICD-10-CM | POA: Diagnosis not present

## 2018-05-25 DIAGNOSIS — E1042 Type 1 diabetes mellitus with diabetic polyneuropathy: Secondary | ICD-10-CM | POA: Diagnosis not present

## 2018-05-25 DIAGNOSIS — E1129 Type 2 diabetes mellitus with other diabetic kidney complication: Secondary | ICD-10-CM | POA: Diagnosis not present

## 2018-05-26 DIAGNOSIS — I69398 Other sequelae of cerebral infarction: Secondary | ICD-10-CM | POA: Diagnosis not present

## 2018-05-26 DIAGNOSIS — I69321 Dysphasia following cerebral infarction: Secondary | ICD-10-CM | POA: Diagnosis not present

## 2018-05-26 DIAGNOSIS — I120 Hypertensive chronic kidney disease with stage 5 chronic kidney disease or end stage renal disease: Secondary | ICD-10-CM | POA: Diagnosis not present

## 2018-05-26 DIAGNOSIS — R2689 Other abnormalities of gait and mobility: Secondary | ICD-10-CM | POA: Diagnosis not present

## 2018-05-26 DIAGNOSIS — N186 End stage renal disease: Secondary | ICD-10-CM | POA: Diagnosis not present

## 2018-05-26 DIAGNOSIS — E1022 Type 1 diabetes mellitus with diabetic chronic kidney disease: Secondary | ICD-10-CM | POA: Diagnosis not present

## 2018-05-27 DIAGNOSIS — I69398 Other sequelae of cerebral infarction: Secondary | ICD-10-CM | POA: Diagnosis not present

## 2018-05-27 DIAGNOSIS — E1129 Type 2 diabetes mellitus with other diabetic kidney complication: Secondary | ICD-10-CM | POA: Diagnosis not present

## 2018-05-27 DIAGNOSIS — D631 Anemia in chronic kidney disease: Secondary | ICD-10-CM | POA: Diagnosis not present

## 2018-05-27 DIAGNOSIS — N186 End stage renal disease: Secondary | ICD-10-CM | POA: Diagnosis not present

## 2018-05-27 DIAGNOSIS — R2689 Other abnormalities of gait and mobility: Secondary | ICD-10-CM | POA: Diagnosis not present

## 2018-05-27 DIAGNOSIS — N2581 Secondary hyperparathyroidism of renal origin: Secondary | ICD-10-CM | POA: Diagnosis not present

## 2018-05-27 DIAGNOSIS — I120 Hypertensive chronic kidney disease with stage 5 chronic kidney disease or end stage renal disease: Secondary | ICD-10-CM | POA: Diagnosis not present

## 2018-05-27 DIAGNOSIS — E1022 Type 1 diabetes mellitus with diabetic chronic kidney disease: Secondary | ICD-10-CM | POA: Diagnosis not present

## 2018-05-27 DIAGNOSIS — E875 Hyperkalemia: Secondary | ICD-10-CM | POA: Diagnosis not present

## 2018-05-27 DIAGNOSIS — I69321 Dysphasia following cerebral infarction: Secondary | ICD-10-CM | POA: Diagnosis not present

## 2018-05-30 DIAGNOSIS — E875 Hyperkalemia: Secondary | ICD-10-CM | POA: Diagnosis not present

## 2018-05-30 DIAGNOSIS — N186 End stage renal disease: Secondary | ICD-10-CM | POA: Diagnosis not present

## 2018-05-30 DIAGNOSIS — D631 Anemia in chronic kidney disease: Secondary | ICD-10-CM | POA: Diagnosis not present

## 2018-05-30 DIAGNOSIS — E1129 Type 2 diabetes mellitus with other diabetic kidney complication: Secondary | ICD-10-CM | POA: Diagnosis not present

## 2018-05-30 DIAGNOSIS — N2581 Secondary hyperparathyroidism of renal origin: Secondary | ICD-10-CM | POA: Diagnosis not present

## 2018-05-31 DIAGNOSIS — I69398 Other sequelae of cerebral infarction: Secondary | ICD-10-CM | POA: Diagnosis not present

## 2018-05-31 DIAGNOSIS — I120 Hypertensive chronic kidney disease with stage 5 chronic kidney disease or end stage renal disease: Secondary | ICD-10-CM | POA: Diagnosis not present

## 2018-05-31 DIAGNOSIS — R2689 Other abnormalities of gait and mobility: Secondary | ICD-10-CM | POA: Diagnosis not present

## 2018-05-31 DIAGNOSIS — I69328 Other speech and language deficits following cerebral infarction: Secondary | ICD-10-CM | POA: Diagnosis not present

## 2018-05-31 DIAGNOSIS — E1051 Type 1 diabetes mellitus with diabetic peripheral angiopathy without gangrene: Secondary | ICD-10-CM | POA: Diagnosis not present

## 2018-05-31 DIAGNOSIS — E1042 Type 1 diabetes mellitus with diabetic polyneuropathy: Secondary | ICD-10-CM | POA: Diagnosis not present

## 2018-05-31 DIAGNOSIS — E782 Mixed hyperlipidemia: Secondary | ICD-10-CM | POA: Diagnosis not present

## 2018-05-31 DIAGNOSIS — Z992 Dependence on renal dialysis: Secondary | ICD-10-CM | POA: Diagnosis not present

## 2018-05-31 DIAGNOSIS — I255 Ischemic cardiomyopathy: Secondary | ICD-10-CM | POA: Diagnosis not present

## 2018-05-31 DIAGNOSIS — E1022 Type 1 diabetes mellitus with diabetic chronic kidney disease: Secondary | ICD-10-CM | POA: Diagnosis not present

## 2018-05-31 DIAGNOSIS — I2510 Atherosclerotic heart disease of native coronary artery without angina pectoris: Secondary | ICD-10-CM | POA: Diagnosis not present

## 2018-05-31 DIAGNOSIS — I69321 Dysphasia following cerebral infarction: Secondary | ICD-10-CM | POA: Diagnosis not present

## 2018-05-31 DIAGNOSIS — N186 End stage renal disease: Secondary | ICD-10-CM | POA: Diagnosis not present

## 2018-06-01 DIAGNOSIS — E1129 Type 2 diabetes mellitus with other diabetic kidney complication: Secondary | ICD-10-CM | POA: Diagnosis not present

## 2018-06-01 DIAGNOSIS — E875 Hyperkalemia: Secondary | ICD-10-CM | POA: Diagnosis not present

## 2018-06-01 DIAGNOSIS — R2689 Other abnormalities of gait and mobility: Secondary | ICD-10-CM | POA: Diagnosis not present

## 2018-06-01 DIAGNOSIS — I120 Hypertensive chronic kidney disease with stage 5 chronic kidney disease or end stage renal disease: Secondary | ICD-10-CM | POA: Diagnosis not present

## 2018-06-01 DIAGNOSIS — D631 Anemia in chronic kidney disease: Secondary | ICD-10-CM | POA: Diagnosis not present

## 2018-06-01 DIAGNOSIS — I69398 Other sequelae of cerebral infarction: Secondary | ICD-10-CM | POA: Diagnosis not present

## 2018-06-01 DIAGNOSIS — N186 End stage renal disease: Secondary | ICD-10-CM | POA: Diagnosis not present

## 2018-06-01 DIAGNOSIS — N2581 Secondary hyperparathyroidism of renal origin: Secondary | ICD-10-CM | POA: Diagnosis not present

## 2018-06-01 DIAGNOSIS — I69321 Dysphasia following cerebral infarction: Secondary | ICD-10-CM | POA: Diagnosis not present

## 2018-06-01 DIAGNOSIS — E1022 Type 1 diabetes mellitus with diabetic chronic kidney disease: Secondary | ICD-10-CM | POA: Diagnosis not present

## 2018-06-02 DIAGNOSIS — I69398 Other sequelae of cerebral infarction: Secondary | ICD-10-CM | POA: Diagnosis not present

## 2018-06-02 DIAGNOSIS — R2689 Other abnormalities of gait and mobility: Secondary | ICD-10-CM | POA: Diagnosis not present

## 2018-06-02 DIAGNOSIS — N186 End stage renal disease: Secondary | ICD-10-CM | POA: Diagnosis not present

## 2018-06-02 DIAGNOSIS — E1022 Type 1 diabetes mellitus with diabetic chronic kidney disease: Secondary | ICD-10-CM | POA: Diagnosis not present

## 2018-06-02 DIAGNOSIS — I120 Hypertensive chronic kidney disease with stage 5 chronic kidney disease or end stage renal disease: Secondary | ICD-10-CM | POA: Diagnosis not present

## 2018-06-02 DIAGNOSIS — I69321 Dysphasia following cerebral infarction: Secondary | ICD-10-CM | POA: Diagnosis not present

## 2018-06-03 DIAGNOSIS — E875 Hyperkalemia: Secondary | ICD-10-CM | POA: Diagnosis not present

## 2018-06-03 DIAGNOSIS — R2689 Other abnormalities of gait and mobility: Secondary | ICD-10-CM | POA: Diagnosis not present

## 2018-06-03 DIAGNOSIS — L281 Prurigo nodularis: Secondary | ICD-10-CM | POA: Diagnosis not present

## 2018-06-03 DIAGNOSIS — N2581 Secondary hyperparathyroidism of renal origin: Secondary | ICD-10-CM | POA: Diagnosis not present

## 2018-06-03 DIAGNOSIS — N186 End stage renal disease: Secondary | ICD-10-CM | POA: Diagnosis not present

## 2018-06-03 DIAGNOSIS — D631 Anemia in chronic kidney disease: Secondary | ICD-10-CM | POA: Diagnosis not present

## 2018-06-03 DIAGNOSIS — I69398 Other sequelae of cerebral infarction: Secondary | ICD-10-CM | POA: Diagnosis not present

## 2018-06-03 DIAGNOSIS — E1129 Type 2 diabetes mellitus with other diabetic kidney complication: Secondary | ICD-10-CM | POA: Diagnosis not present

## 2018-06-03 DIAGNOSIS — E1022 Type 1 diabetes mellitus with diabetic chronic kidney disease: Secondary | ICD-10-CM | POA: Diagnosis not present

## 2018-06-03 DIAGNOSIS — I120 Hypertensive chronic kidney disease with stage 5 chronic kidney disease or end stage renal disease: Secondary | ICD-10-CM | POA: Diagnosis not present

## 2018-06-03 DIAGNOSIS — I69321 Dysphasia following cerebral infarction: Secondary | ICD-10-CM | POA: Diagnosis not present

## 2018-06-06 DIAGNOSIS — D631 Anemia in chronic kidney disease: Secondary | ICD-10-CM | POA: Diagnosis not present

## 2018-06-06 DIAGNOSIS — I509 Heart failure, unspecified: Secondary | ICD-10-CM | POA: Diagnosis not present

## 2018-06-06 DIAGNOSIS — E1122 Type 2 diabetes mellitus with diabetic chronic kidney disease: Secondary | ICD-10-CM | POA: Diagnosis not present

## 2018-06-06 DIAGNOSIS — Z79899 Other long term (current) drug therapy: Secondary | ICD-10-CM | POA: Diagnosis not present

## 2018-06-06 DIAGNOSIS — I132 Hypertensive heart and chronic kidney disease with heart failure and with stage 5 chronic kidney disease, or end stage renal disease: Secondary | ICD-10-CM | POA: Diagnosis not present

## 2018-06-06 DIAGNOSIS — E7800 Pure hypercholesterolemia, unspecified: Secondary | ICD-10-CM | POA: Diagnosis not present

## 2018-06-06 DIAGNOSIS — K5900 Constipation, unspecified: Secondary | ICD-10-CM | POA: Diagnosis not present

## 2018-06-06 DIAGNOSIS — E875 Hyperkalemia: Secondary | ICD-10-CM | POA: Diagnosis not present

## 2018-06-06 DIAGNOSIS — E1129 Type 2 diabetes mellitus with other diabetic kidney complication: Secondary | ICD-10-CM | POA: Diagnosis not present

## 2018-06-06 DIAGNOSIS — N186 End stage renal disease: Secondary | ICD-10-CM | POA: Diagnosis not present

## 2018-06-06 DIAGNOSIS — Z7982 Long term (current) use of aspirin: Secondary | ICD-10-CM | POA: Diagnosis not present

## 2018-06-06 DIAGNOSIS — F5089 Other specified eating disorder: Secondary | ICD-10-CM | POA: Diagnosis not present

## 2018-06-06 DIAGNOSIS — Z992 Dependence on renal dialysis: Secondary | ICD-10-CM | POA: Diagnosis not present

## 2018-06-06 DIAGNOSIS — N2581 Secondary hyperparathyroidism of renal origin: Secondary | ICD-10-CM | POA: Diagnosis not present

## 2018-06-06 DIAGNOSIS — Z794 Long term (current) use of insulin: Secondary | ICD-10-CM | POA: Diagnosis not present

## 2018-06-08 DIAGNOSIS — E1129 Type 2 diabetes mellitus with other diabetic kidney complication: Secondary | ICD-10-CM | POA: Diagnosis not present

## 2018-06-08 DIAGNOSIS — N2581 Secondary hyperparathyroidism of renal origin: Secondary | ICD-10-CM | POA: Diagnosis not present

## 2018-06-08 DIAGNOSIS — N186 End stage renal disease: Secondary | ICD-10-CM | POA: Diagnosis not present

## 2018-06-08 DIAGNOSIS — E875 Hyperkalemia: Secondary | ICD-10-CM | POA: Diagnosis not present

## 2018-06-08 DIAGNOSIS — D631 Anemia in chronic kidney disease: Secondary | ICD-10-CM | POA: Diagnosis not present

## 2018-06-09 DIAGNOSIS — I69321 Dysphasia following cerebral infarction: Secondary | ICD-10-CM | POA: Diagnosis not present

## 2018-06-09 DIAGNOSIS — E1022 Type 1 diabetes mellitus with diabetic chronic kidney disease: Secondary | ICD-10-CM | POA: Diagnosis not present

## 2018-06-09 DIAGNOSIS — I120 Hypertensive chronic kidney disease with stage 5 chronic kidney disease or end stage renal disease: Secondary | ICD-10-CM | POA: Diagnosis not present

## 2018-06-09 DIAGNOSIS — R2689 Other abnormalities of gait and mobility: Secondary | ICD-10-CM | POA: Diagnosis not present

## 2018-06-09 DIAGNOSIS — N186 End stage renal disease: Secondary | ICD-10-CM | POA: Diagnosis not present

## 2018-06-09 DIAGNOSIS — I69398 Other sequelae of cerebral infarction: Secondary | ICD-10-CM | POA: Diagnosis not present

## 2018-06-10 DIAGNOSIS — E1022 Type 1 diabetes mellitus with diabetic chronic kidney disease: Secondary | ICD-10-CM | POA: Diagnosis not present

## 2018-06-10 DIAGNOSIS — D631 Anemia in chronic kidney disease: Secondary | ICD-10-CM | POA: Diagnosis not present

## 2018-06-10 DIAGNOSIS — R2689 Other abnormalities of gait and mobility: Secondary | ICD-10-CM | POA: Diagnosis not present

## 2018-06-10 DIAGNOSIS — N186 End stage renal disease: Secondary | ICD-10-CM | POA: Diagnosis not present

## 2018-06-10 DIAGNOSIS — N2581 Secondary hyperparathyroidism of renal origin: Secondary | ICD-10-CM | POA: Diagnosis not present

## 2018-06-10 DIAGNOSIS — I69321 Dysphasia following cerebral infarction: Secondary | ICD-10-CM | POA: Diagnosis not present

## 2018-06-10 DIAGNOSIS — E875 Hyperkalemia: Secondary | ICD-10-CM | POA: Diagnosis not present

## 2018-06-10 DIAGNOSIS — E1129 Type 2 diabetes mellitus with other diabetic kidney complication: Secondary | ICD-10-CM | POA: Diagnosis not present

## 2018-06-10 DIAGNOSIS — I69398 Other sequelae of cerebral infarction: Secondary | ICD-10-CM | POA: Diagnosis not present

## 2018-06-10 DIAGNOSIS — I120 Hypertensive chronic kidney disease with stage 5 chronic kidney disease or end stage renal disease: Secondary | ICD-10-CM | POA: Diagnosis not present

## 2018-06-13 DIAGNOSIS — N2581 Secondary hyperparathyroidism of renal origin: Secondary | ICD-10-CM | POA: Diagnosis not present

## 2018-06-13 DIAGNOSIS — N186 End stage renal disease: Secondary | ICD-10-CM | POA: Diagnosis not present

## 2018-06-13 DIAGNOSIS — D631 Anemia in chronic kidney disease: Secondary | ICD-10-CM | POA: Diagnosis not present

## 2018-06-13 DIAGNOSIS — E875 Hyperkalemia: Secondary | ICD-10-CM | POA: Diagnosis not present

## 2018-06-13 DIAGNOSIS — E1129 Type 2 diabetes mellitus with other diabetic kidney complication: Secondary | ICD-10-CM | POA: Diagnosis not present

## 2018-06-14 DIAGNOSIS — I120 Hypertensive chronic kidney disease with stage 5 chronic kidney disease or end stage renal disease: Secondary | ICD-10-CM | POA: Diagnosis not present

## 2018-06-14 DIAGNOSIS — N186 End stage renal disease: Secondary | ICD-10-CM | POA: Diagnosis not present

## 2018-06-14 DIAGNOSIS — I69398 Other sequelae of cerebral infarction: Secondary | ICD-10-CM | POA: Diagnosis not present

## 2018-06-14 DIAGNOSIS — I69321 Dysphasia following cerebral infarction: Secondary | ICD-10-CM | POA: Diagnosis not present

## 2018-06-14 DIAGNOSIS — E1022 Type 1 diabetes mellitus with diabetic chronic kidney disease: Secondary | ICD-10-CM | POA: Diagnosis not present

## 2018-06-14 DIAGNOSIS — R2689 Other abnormalities of gait and mobility: Secondary | ICD-10-CM | POA: Diagnosis not present

## 2018-06-15 DIAGNOSIS — N186 End stage renal disease: Secondary | ICD-10-CM | POA: Diagnosis not present

## 2018-06-15 DIAGNOSIS — I69398 Other sequelae of cerebral infarction: Secondary | ICD-10-CM | POA: Diagnosis not present

## 2018-06-15 DIAGNOSIS — R2689 Other abnormalities of gait and mobility: Secondary | ICD-10-CM | POA: Diagnosis not present

## 2018-06-15 DIAGNOSIS — D631 Anemia in chronic kidney disease: Secondary | ICD-10-CM | POA: Diagnosis not present

## 2018-06-15 DIAGNOSIS — E875 Hyperkalemia: Secondary | ICD-10-CM | POA: Diagnosis not present

## 2018-06-15 DIAGNOSIS — N2581 Secondary hyperparathyroidism of renal origin: Secondary | ICD-10-CM | POA: Diagnosis not present

## 2018-06-15 DIAGNOSIS — E1129 Type 2 diabetes mellitus with other diabetic kidney complication: Secondary | ICD-10-CM | POA: Diagnosis not present

## 2018-06-15 DIAGNOSIS — I120 Hypertensive chronic kidney disease with stage 5 chronic kidney disease or end stage renal disease: Secondary | ICD-10-CM | POA: Diagnosis not present

## 2018-06-15 DIAGNOSIS — E1022 Type 1 diabetes mellitus with diabetic chronic kidney disease: Secondary | ICD-10-CM | POA: Diagnosis not present

## 2018-06-15 DIAGNOSIS — I69321 Dysphasia following cerebral infarction: Secondary | ICD-10-CM | POA: Diagnosis not present

## 2018-06-16 DIAGNOSIS — I69321 Dysphasia following cerebral infarction: Secondary | ICD-10-CM | POA: Diagnosis not present

## 2018-06-16 DIAGNOSIS — I69398 Other sequelae of cerebral infarction: Secondary | ICD-10-CM | POA: Diagnosis not present

## 2018-06-16 DIAGNOSIS — E1022 Type 1 diabetes mellitus with diabetic chronic kidney disease: Secondary | ICD-10-CM | POA: Diagnosis not present

## 2018-06-16 DIAGNOSIS — I120 Hypertensive chronic kidney disease with stage 5 chronic kidney disease or end stage renal disease: Secondary | ICD-10-CM | POA: Diagnosis not present

## 2018-06-16 DIAGNOSIS — R2689 Other abnormalities of gait and mobility: Secondary | ICD-10-CM | POA: Diagnosis not present

## 2018-06-16 DIAGNOSIS — N186 End stage renal disease: Secondary | ICD-10-CM | POA: Diagnosis not present

## 2018-06-17 DIAGNOSIS — Z23 Encounter for immunization: Secondary | ICD-10-CM | POA: Diagnosis not present

## 2018-06-17 DIAGNOSIS — N2581 Secondary hyperparathyroidism of renal origin: Secondary | ICD-10-CM | POA: Diagnosis not present

## 2018-06-17 DIAGNOSIS — E1042 Type 1 diabetes mellitus with diabetic polyneuropathy: Secondary | ICD-10-CM | POA: Diagnosis not present

## 2018-06-17 DIAGNOSIS — E875 Hyperkalemia: Secondary | ICD-10-CM | POA: Diagnosis not present

## 2018-06-17 DIAGNOSIS — E1129 Type 2 diabetes mellitus with other diabetic kidney complication: Secondary | ICD-10-CM | POA: Diagnosis not present

## 2018-06-17 DIAGNOSIS — N186 End stage renal disease: Secondary | ICD-10-CM | POA: Diagnosis not present

## 2018-06-17 DIAGNOSIS — D631 Anemia in chronic kidney disease: Secondary | ICD-10-CM | POA: Diagnosis not present

## 2018-06-20 DIAGNOSIS — N2581 Secondary hyperparathyroidism of renal origin: Secondary | ICD-10-CM | POA: Diagnosis not present

## 2018-06-20 DIAGNOSIS — E1129 Type 2 diabetes mellitus with other diabetic kidney complication: Secondary | ICD-10-CM | POA: Diagnosis not present

## 2018-06-20 DIAGNOSIS — D631 Anemia in chronic kidney disease: Secondary | ICD-10-CM | POA: Diagnosis not present

## 2018-06-20 DIAGNOSIS — E875 Hyperkalemia: Secondary | ICD-10-CM | POA: Diagnosis not present

## 2018-06-20 DIAGNOSIS — N186 End stage renal disease: Secondary | ICD-10-CM | POA: Diagnosis not present

## 2018-06-21 DIAGNOSIS — I120 Hypertensive chronic kidney disease with stage 5 chronic kidney disease or end stage renal disease: Secondary | ICD-10-CM | POA: Diagnosis not present

## 2018-06-21 DIAGNOSIS — R2689 Other abnormalities of gait and mobility: Secondary | ICD-10-CM | POA: Diagnosis not present

## 2018-06-21 DIAGNOSIS — E1022 Type 1 diabetes mellitus with diabetic chronic kidney disease: Secondary | ICD-10-CM | POA: Diagnosis not present

## 2018-06-21 DIAGNOSIS — I69321 Dysphasia following cerebral infarction: Secondary | ICD-10-CM | POA: Diagnosis not present

## 2018-06-21 DIAGNOSIS — N2581 Secondary hyperparathyroidism of renal origin: Secondary | ICD-10-CM | POA: Diagnosis not present

## 2018-06-21 DIAGNOSIS — E1122 Type 2 diabetes mellitus with diabetic chronic kidney disease: Secondary | ICD-10-CM | POA: Diagnosis not present

## 2018-06-21 DIAGNOSIS — D631 Anemia in chronic kidney disease: Secondary | ICD-10-CM | POA: Diagnosis not present

## 2018-06-21 DIAGNOSIS — N186 End stage renal disease: Secondary | ICD-10-CM | POA: Diagnosis not present

## 2018-06-21 DIAGNOSIS — D509 Iron deficiency anemia, unspecified: Secondary | ICD-10-CM | POA: Diagnosis not present

## 2018-06-21 DIAGNOSIS — I69398 Other sequelae of cerebral infarction: Secondary | ICD-10-CM | POA: Diagnosis not present

## 2018-06-21 DIAGNOSIS — E875 Hyperkalemia: Secondary | ICD-10-CM | POA: Diagnosis not present

## 2018-06-21 DIAGNOSIS — Z992 Dependence on renal dialysis: Secondary | ICD-10-CM | POA: Diagnosis not present

## 2018-06-21 DIAGNOSIS — E1129 Type 2 diabetes mellitus with other diabetic kidney complication: Secondary | ICD-10-CM | POA: Diagnosis not present

## 2018-06-22 DIAGNOSIS — N2581 Secondary hyperparathyroidism of renal origin: Secondary | ICD-10-CM | POA: Diagnosis not present

## 2018-06-22 DIAGNOSIS — E875 Hyperkalemia: Secondary | ICD-10-CM | POA: Diagnosis not present

## 2018-06-22 DIAGNOSIS — E1129 Type 2 diabetes mellitus with other diabetic kidney complication: Secondary | ICD-10-CM | POA: Diagnosis not present

## 2018-06-22 DIAGNOSIS — D631 Anemia in chronic kidney disease: Secondary | ICD-10-CM | POA: Diagnosis not present

## 2018-06-22 DIAGNOSIS — N186 End stage renal disease: Secondary | ICD-10-CM | POA: Diagnosis not present

## 2018-06-22 DIAGNOSIS — D509 Iron deficiency anemia, unspecified: Secondary | ICD-10-CM | POA: Diagnosis not present

## 2018-06-23 DIAGNOSIS — R2689 Other abnormalities of gait and mobility: Secondary | ICD-10-CM | POA: Diagnosis not present

## 2018-06-23 DIAGNOSIS — I69321 Dysphasia following cerebral infarction: Secondary | ICD-10-CM | POA: Diagnosis not present

## 2018-06-23 DIAGNOSIS — I120 Hypertensive chronic kidney disease with stage 5 chronic kidney disease or end stage renal disease: Secondary | ICD-10-CM | POA: Diagnosis not present

## 2018-06-23 DIAGNOSIS — E1022 Type 1 diabetes mellitus with diabetic chronic kidney disease: Secondary | ICD-10-CM | POA: Diagnosis not present

## 2018-06-23 DIAGNOSIS — N186 End stage renal disease: Secondary | ICD-10-CM | POA: Diagnosis not present

## 2018-06-23 DIAGNOSIS — I69398 Other sequelae of cerebral infarction: Secondary | ICD-10-CM | POA: Diagnosis not present

## 2018-06-24 DIAGNOSIS — R2689 Other abnormalities of gait and mobility: Secondary | ICD-10-CM | POA: Diagnosis not present

## 2018-06-24 DIAGNOSIS — N186 End stage renal disease: Secondary | ICD-10-CM | POA: Diagnosis not present

## 2018-06-24 DIAGNOSIS — I69321 Dysphasia following cerebral infarction: Secondary | ICD-10-CM | POA: Diagnosis not present

## 2018-06-24 DIAGNOSIS — E1022 Type 1 diabetes mellitus with diabetic chronic kidney disease: Secondary | ICD-10-CM | POA: Diagnosis not present

## 2018-06-24 DIAGNOSIS — I69398 Other sequelae of cerebral infarction: Secondary | ICD-10-CM | POA: Diagnosis not present

## 2018-06-24 DIAGNOSIS — I120 Hypertensive chronic kidney disease with stage 5 chronic kidney disease or end stage renal disease: Secondary | ICD-10-CM | POA: Diagnosis not present

## 2018-06-27 DIAGNOSIS — N2581 Secondary hyperparathyroidism of renal origin: Secondary | ICD-10-CM | POA: Diagnosis not present

## 2018-06-27 DIAGNOSIS — D509 Iron deficiency anemia, unspecified: Secondary | ICD-10-CM | POA: Diagnosis not present

## 2018-06-27 DIAGNOSIS — E875 Hyperkalemia: Secondary | ICD-10-CM | POA: Diagnosis not present

## 2018-06-27 DIAGNOSIS — E1129 Type 2 diabetes mellitus with other diabetic kidney complication: Secondary | ICD-10-CM | POA: Diagnosis not present

## 2018-06-27 DIAGNOSIS — N186 End stage renal disease: Secondary | ICD-10-CM | POA: Diagnosis not present

## 2018-06-27 DIAGNOSIS — D631 Anemia in chronic kidney disease: Secondary | ICD-10-CM | POA: Diagnosis not present

## 2018-06-29 DIAGNOSIS — N2581 Secondary hyperparathyroidism of renal origin: Secondary | ICD-10-CM | POA: Diagnosis not present

## 2018-06-29 DIAGNOSIS — D509 Iron deficiency anemia, unspecified: Secondary | ICD-10-CM | POA: Diagnosis not present

## 2018-06-29 DIAGNOSIS — E875 Hyperkalemia: Secondary | ICD-10-CM | POA: Diagnosis not present

## 2018-06-29 DIAGNOSIS — N186 End stage renal disease: Secondary | ICD-10-CM | POA: Diagnosis not present

## 2018-06-29 DIAGNOSIS — D631 Anemia in chronic kidney disease: Secondary | ICD-10-CM | POA: Diagnosis not present

## 2018-06-29 DIAGNOSIS — E1129 Type 2 diabetes mellitus with other diabetic kidney complication: Secondary | ICD-10-CM | POA: Diagnosis not present

## 2018-07-01 DIAGNOSIS — N186 End stage renal disease: Secondary | ICD-10-CM | POA: Diagnosis not present

## 2018-07-01 DIAGNOSIS — E875 Hyperkalemia: Secondary | ICD-10-CM | POA: Diagnosis not present

## 2018-07-01 DIAGNOSIS — N2581 Secondary hyperparathyroidism of renal origin: Secondary | ICD-10-CM | POA: Diagnosis not present

## 2018-07-01 DIAGNOSIS — E1129 Type 2 diabetes mellitus with other diabetic kidney complication: Secondary | ICD-10-CM | POA: Diagnosis not present

## 2018-07-01 DIAGNOSIS — D509 Iron deficiency anemia, unspecified: Secondary | ICD-10-CM | POA: Diagnosis not present

## 2018-07-01 DIAGNOSIS — D631 Anemia in chronic kidney disease: Secondary | ICD-10-CM | POA: Diagnosis not present

## 2018-07-04 DIAGNOSIS — D631 Anemia in chronic kidney disease: Secondary | ICD-10-CM | POA: Diagnosis not present

## 2018-07-04 DIAGNOSIS — E1129 Type 2 diabetes mellitus with other diabetic kidney complication: Secondary | ICD-10-CM | POA: Diagnosis not present

## 2018-07-04 DIAGNOSIS — N2581 Secondary hyperparathyroidism of renal origin: Secondary | ICD-10-CM | POA: Diagnosis not present

## 2018-07-04 DIAGNOSIS — D509 Iron deficiency anemia, unspecified: Secondary | ICD-10-CM | POA: Diagnosis not present

## 2018-07-04 DIAGNOSIS — E875 Hyperkalemia: Secondary | ICD-10-CM | POA: Diagnosis not present

## 2018-07-04 DIAGNOSIS — N186 End stage renal disease: Secondary | ICD-10-CM | POA: Diagnosis not present

## 2018-07-05 DIAGNOSIS — R2689 Other abnormalities of gait and mobility: Secondary | ICD-10-CM | POA: Diagnosis not present

## 2018-07-05 DIAGNOSIS — N186 End stage renal disease: Secondary | ICD-10-CM | POA: Diagnosis not present

## 2018-07-05 DIAGNOSIS — I120 Hypertensive chronic kidney disease with stage 5 chronic kidney disease or end stage renal disease: Secondary | ICD-10-CM | POA: Diagnosis not present

## 2018-07-05 DIAGNOSIS — E1022 Type 1 diabetes mellitus with diabetic chronic kidney disease: Secondary | ICD-10-CM | POA: Diagnosis not present

## 2018-07-05 DIAGNOSIS — I69398 Other sequelae of cerebral infarction: Secondary | ICD-10-CM | POA: Diagnosis not present

## 2018-07-05 DIAGNOSIS — I69321 Dysphasia following cerebral infarction: Secondary | ICD-10-CM | POA: Diagnosis not present

## 2018-07-06 DIAGNOSIS — E1129 Type 2 diabetes mellitus with other diabetic kidney complication: Secondary | ICD-10-CM | POA: Diagnosis not present

## 2018-07-06 DIAGNOSIS — D631 Anemia in chronic kidney disease: Secondary | ICD-10-CM | POA: Diagnosis not present

## 2018-07-06 DIAGNOSIS — N186 End stage renal disease: Secondary | ICD-10-CM | POA: Diagnosis not present

## 2018-07-06 DIAGNOSIS — D509 Iron deficiency anemia, unspecified: Secondary | ICD-10-CM | POA: Diagnosis not present

## 2018-07-06 DIAGNOSIS — E875 Hyperkalemia: Secondary | ICD-10-CM | POA: Diagnosis not present

## 2018-07-06 DIAGNOSIS — N2581 Secondary hyperparathyroidism of renal origin: Secondary | ICD-10-CM | POA: Diagnosis not present

## 2018-07-07 ENCOUNTER — Other Ambulatory Visit: Payer: Self-pay

## 2018-07-07 ENCOUNTER — Inpatient Hospital Stay
Admission: AD | Admit: 2018-07-07 | Payer: Self-pay | Source: Other Acute Inpatient Hospital | Admitting: Internal Medicine

## 2018-07-07 ENCOUNTER — Encounter (HOSPITAL_COMMUNITY): Payer: Self-pay | Admitting: *Deleted

## 2018-07-07 ENCOUNTER — Inpatient Hospital Stay (HOSPITAL_COMMUNITY): Payer: Medicare Other

## 2018-07-07 ENCOUNTER — Inpatient Hospital Stay (HOSPITAL_COMMUNITY)
Admission: AD | Admit: 2018-07-07 | Discharge: 2018-07-10 | DRG: 388 | Disposition: A | Discharge status: Home / Self Care | Payer: Medicare Other | Source: Other Acute Inpatient Hospital | Attending: Internal Medicine | Admitting: Internal Medicine

## 2018-07-07 DIAGNOSIS — N2581 Secondary hyperparathyroidism of renal origin: Secondary | ICD-10-CM | POA: Diagnosis present

## 2018-07-07 DIAGNOSIS — IMO0002 Reserved for concepts with insufficient information to code with codable children: Secondary | ICD-10-CM

## 2018-07-07 DIAGNOSIS — Z87891 Personal history of nicotine dependence: Secondary | ICD-10-CM

## 2018-07-07 DIAGNOSIS — Z888 Allergy status to other drugs, medicaments and biological substances status: Secondary | ICD-10-CM

## 2018-07-07 DIAGNOSIS — K56600 Partial intestinal obstruction, unspecified as to cause: Secondary | ICD-10-CM | POA: Diagnosis not present

## 2018-07-07 DIAGNOSIS — E1122 Type 2 diabetes mellitus with diabetic chronic kidney disease: Secondary | ICD-10-CM | POA: Diagnosis present

## 2018-07-07 DIAGNOSIS — Z87442 Personal history of urinary calculi: Secondary | ICD-10-CM

## 2018-07-07 DIAGNOSIS — Z89511 Acquired absence of right leg below knee: Secondary | ICD-10-CM

## 2018-07-07 DIAGNOSIS — F429 Obsessive-compulsive disorder, unspecified: Secondary | ICD-10-CM | POA: Diagnosis present

## 2018-07-07 DIAGNOSIS — Z992 Dependence on renal dialysis: Secondary | ICD-10-CM | POA: Diagnosis not present

## 2018-07-07 DIAGNOSIS — E785 Hyperlipidemia, unspecified: Secondary | ICD-10-CM | POA: Diagnosis present

## 2018-07-07 DIAGNOSIS — Z0189 Encounter for other specified special examinations: Secondary | ICD-10-CM

## 2018-07-07 DIAGNOSIS — Z7982 Long term (current) use of aspirin: Secondary | ICD-10-CM

## 2018-07-07 DIAGNOSIS — I10 Essential (primary) hypertension: Secondary | ICD-10-CM | POA: Diagnosis not present

## 2018-07-07 DIAGNOSIS — Z915 Personal history of self-harm: Secondary | ICD-10-CM

## 2018-07-07 DIAGNOSIS — N186 End stage renal disease: Secondary | ICD-10-CM | POA: Diagnosis present

## 2018-07-07 DIAGNOSIS — K5669 Other partial intestinal obstruction: Principal | ICD-10-CM | POA: Diagnosis present

## 2018-07-07 DIAGNOSIS — T182XXA Foreign body in stomach, initial encounter: Secondary | ICD-10-CM | POA: Diagnosis present

## 2018-07-07 DIAGNOSIS — K5730 Diverticulosis of large intestine without perforation or abscess without bleeding: Secondary | ICD-10-CM | POA: Diagnosis not present

## 2018-07-07 DIAGNOSIS — I959 Hypotension, unspecified: Secondary | ICD-10-CM | POA: Diagnosis not present

## 2018-07-07 DIAGNOSIS — K3189 Other diseases of stomach and duodenum: Secondary | ICD-10-CM | POA: Diagnosis present

## 2018-07-07 DIAGNOSIS — E1151 Type 2 diabetes mellitus with diabetic peripheral angiopathy without gangrene: Secondary | ICD-10-CM | POA: Diagnosis present

## 2018-07-07 DIAGNOSIS — R112 Nausea with vomiting, unspecified: Secondary | ICD-10-CM | POA: Diagnosis not present

## 2018-07-07 DIAGNOSIS — I255 Ischemic cardiomyopathy: Secondary | ICD-10-CM | POA: Diagnosis present

## 2018-07-07 DIAGNOSIS — R319 Hematuria, unspecified: Secondary | ICD-10-CM | POA: Diagnosis not present

## 2018-07-07 DIAGNOSIS — Z8673 Personal history of transient ischemic attack (TIA), and cerebral infarction without residual deficits: Secondary | ICD-10-CM

## 2018-07-07 DIAGNOSIS — D631 Anemia in chronic kidney disease: Secondary | ICD-10-CM | POA: Diagnosis present

## 2018-07-07 DIAGNOSIS — E1140 Type 2 diabetes mellitus with diabetic neuropathy, unspecified: Secondary | ICD-10-CM | POA: Diagnosis present

## 2018-07-07 DIAGNOSIS — K565 Intestinal adhesions [bands], unspecified as to partial versus complete obstruction: Secondary | ICD-10-CM | POA: Diagnosis not present

## 2018-07-07 DIAGNOSIS — R52 Pain, unspecified: Secondary | ICD-10-CM | POA: Diagnosis not present

## 2018-07-07 DIAGNOSIS — D696 Thrombocytopenia, unspecified: Secondary | ICD-10-CM | POA: Diagnosis present

## 2018-07-07 DIAGNOSIS — Z5900 Homelessness unspecified: Secondary | ICD-10-CM

## 2018-07-07 DIAGNOSIS — Z794 Long term (current) use of insulin: Secondary | ICD-10-CM | POA: Diagnosis not present

## 2018-07-07 DIAGNOSIS — Z885 Allergy status to narcotic agent status: Secondary | ICD-10-CM

## 2018-07-07 DIAGNOSIS — X58XXXA Exposure to other specified factors, initial encounter: Secondary | ICD-10-CM | POA: Diagnosis present

## 2018-07-07 DIAGNOSIS — Z833 Family history of diabetes mellitus: Secondary | ICD-10-CM

## 2018-07-07 DIAGNOSIS — I132 Hypertensive heart and chronic kidney disease with heart failure and with stage 5 chronic kidney disease, or end stage renal disease: Secondary | ICD-10-CM | POA: Diagnosis present

## 2018-07-07 DIAGNOSIS — R140 Abdominal distension (gaseous): Secondary | ICD-10-CM | POA: Diagnosis not present

## 2018-07-07 DIAGNOSIS — I120 Hypertensive chronic kidney disease with stage 5 chronic kidney disease or end stage renal disease: Secondary | ICD-10-CM | POA: Diagnosis not present

## 2018-07-07 DIAGNOSIS — Z6827 Body mass index (BMI) 27.0-27.9, adult: Secondary | ICD-10-CM | POA: Diagnosis not present

## 2018-07-07 DIAGNOSIS — I5042 Chronic combined systolic (congestive) and diastolic (congestive) heart failure: Secondary | ICD-10-CM | POA: Diagnosis present

## 2018-07-07 DIAGNOSIS — E669 Obesity, unspecified: Secondary | ICD-10-CM | POA: Diagnosis present

## 2018-07-07 DIAGNOSIS — B9689 Other specified bacterial agents as the cause of diseases classified elsewhere: Secondary | ICD-10-CM | POA: Diagnosis not present

## 2018-07-07 DIAGNOSIS — R1084 Generalized abdominal pain: Secondary | ICD-10-CM | POA: Diagnosis not present

## 2018-07-07 DIAGNOSIS — E1165 Type 2 diabetes mellitus with hyperglycemia: Secondary | ICD-10-CM | POA: Diagnosis not present

## 2018-07-07 DIAGNOSIS — Z79899 Other long term (current) drug therapy: Secondary | ICD-10-CM

## 2018-07-07 DIAGNOSIS — N390 Urinary tract infection, site not specified: Secondary | ICD-10-CM | POA: Diagnosis not present

## 2018-07-07 DIAGNOSIS — Z8249 Family history of ischemic heart disease and other diseases of the circulatory system: Secondary | ICD-10-CM

## 2018-07-07 DIAGNOSIS — Z4682 Encounter for fitting and adjustment of non-vascular catheter: Secondary | ICD-10-CM | POA: Diagnosis not present

## 2018-07-07 DIAGNOSIS — K56609 Unspecified intestinal obstruction, unspecified as to partial versus complete obstruction: Secondary | ICD-10-CM | POA: Diagnosis present

## 2018-07-07 DIAGNOSIS — Z89519 Acquired absence of unspecified leg below knee: Secondary | ICD-10-CM

## 2018-07-07 DIAGNOSIS — R109 Unspecified abdominal pain: Secondary | ICD-10-CM

## 2018-07-07 DIAGNOSIS — Z89512 Acquired absence of left leg below knee: Secondary | ICD-10-CM

## 2018-07-07 DIAGNOSIS — I739 Peripheral vascular disease, unspecified: Secondary | ICD-10-CM | POA: Diagnosis present

## 2018-07-07 DIAGNOSIS — R000 Tachycardia, unspecified: Secondary | ICD-10-CM | POA: Diagnosis not present

## 2018-07-07 DIAGNOSIS — Z880 Allergy status to penicillin: Secondary | ICD-10-CM

## 2018-07-07 LAB — COMPREHENSIVE METABOLIC PANEL
ALBUMIN: 3.7 g/dL (ref 3.5–5.0)
ALT: 28 U/L (ref 0–44)
AST: 18 U/L (ref 15–41)
Alkaline Phosphatase: 88 U/L (ref 38–126)
Anion gap: 17 — ABNORMAL HIGH (ref 5–15)
BILIRUBIN TOTAL: 0.9 mg/dL (ref 0.3–1.2)
BUN: 44 mg/dL — AB (ref 6–20)
CHLORIDE: 94 mmol/L — AB (ref 98–111)
CO2: 27 mmol/L (ref 22–32)
CREATININE: 8.09 mg/dL — AB (ref 0.44–1.00)
Calcium: 10.5 mg/dL — ABNORMAL HIGH (ref 8.9–10.3)
GFR calc Af Amer: 6 mL/min — ABNORMAL LOW (ref >60)
GFR, EST NON AFRICAN AMERICAN: 6 mL/min — AB (ref >60)
Glucose, Bld: 162 mg/dL — ABNORMAL HIGH (ref 70–99)
POTASSIUM: 5 mmol/L (ref 3.5–5.1)
Sodium: 138 mmol/L (ref 135–145)
TOTAL PROTEIN: 8.8 g/dL — AB (ref 6.5–8.1)

## 2018-07-07 LAB — CBC WITH DIFFERENTIAL/PLATELET
ABS IMMATURE GRANULOCYTES: 0.04 10*3/uL (ref 0.00–0.07)
Basophils Absolute: 0 10*3/uL (ref 0.0–0.1)
Basophils Relative: 0 %
Eosinophils Absolute: 0.2 10*3/uL (ref 0.0–0.5)
Eosinophils Relative: 2 %
HCT: 39.5 % (ref 36.0–46.0)
HEMOGLOBIN: 11.4 g/dL — AB (ref 12.0–15.0)
Immature Granulocytes: 1 %
LYMPHS PCT: 18 %
Lymphs Abs: 1.5 10*3/uL (ref 0.7–4.0)
MCH: 24.8 pg — AB (ref 26.0–34.0)
MCHC: 28.9 g/dL — AB (ref 30.0–36.0)
MCV: 86.1 fL (ref 80.0–100.0)
MONO ABS: 0.8 10*3/uL (ref 0.1–1.0)
MONOS PCT: 10 %
NEUTROS ABS: 5.6 10*3/uL (ref 1.7–7.7)
Neutrophils Relative %: 69 %
PLATELETS: 123 10*3/uL — AB (ref 150–400)
RBC: 4.59 MIL/uL (ref 3.87–5.11)
RDW: 19.2 % — ABNORMAL HIGH (ref 11.5–15.5)
WBC: 8.1 10*3/uL (ref 4.0–10.5)
nRBC: 0 % (ref 0.0–0.2)

## 2018-07-07 LAB — LACTIC ACID, PLASMA: LACTIC ACID, VENOUS: 1.3 mmol/L (ref 0.5–1.9)

## 2018-07-07 MED ORDER — DIATRIZOATE MEGLUMINE & SODIUM 66-10 % PO SOLN
90.0000 mL | Freq: Once | ORAL | Status: AC
  Administered 2018-07-08: 90 mL via NASOGASTRIC
  Filled 2018-07-07: qty 90

## 2018-07-07 MED ORDER — HYDRALAZINE HCL 20 MG/ML IJ SOLN
5.0000 mg | INTRAMUSCULAR | Status: DC | PRN
  Administered 2018-07-08: 5 mg via INTRAVENOUS
  Filled 2018-07-07: qty 1

## 2018-07-07 MED ORDER — FENTANYL CITRATE (PF) 100 MCG/2ML IJ SOLN
25.0000 ug | INTRAMUSCULAR | Status: DC | PRN
  Administered 2018-07-07 – 2018-07-09 (×5): 25 ug via INTRAVENOUS
  Filled 2018-07-07 (×6): qty 2

## 2018-07-07 NOTE — Care Management (Signed)
This is a no charge Warden/ranger from [place] per [place], [place]  42 year old lady with past medical history of hypertension, diabetes mellitus, GERD, ESRD-HD (MWF), anemia, PAD, CKD, obesity, CHF with EF 35%, who presents with nausea, vomiting, abdominal pain for 3 days, CT scan showed small bowel obstruction.  Patient had dialysis yesterday.  Patient was found to have WBC 7.7, creatinine creatinine 7.4, BUN 46, potassium 5.0, temperature normal, no tachycardia, oxygen saturation 96% on room air, blood pressure 218/93-->195/78.  Patient is accepted to telemetry bed as inpatient.  General surgeon, Dr. Dema Severin was consulted.  Please call manager of [facility] at [phone] when pt arrives to floor   Ivor Costa, MD  [facility] Pager [phone]  If 7PM-7AM, please contact night-coverage [email or web address] Password [email or web address] 07/07/2018, 7:51 PM

## 2018-07-07 NOTE — Consult Note (Addendum)
CC/Reason for consult: Possible SBO and gastric bezoar, consult by Dr. Blaine Hamper  HPI: Karen [name] is an 42 y.o. female transferred from [place] - hx of HTN, HLD, PAD s/p L BKA, ESRD on HD, ischemic cardiomyopathy, presented to [facility] today with onset of n/v/abdominal cramps. She states she has never had this before. The cramps were in her right lower abdomen. Nothing made it better/worse. It did not radiate. She denies f/c. She reports last passing gas and having a BM yesterday.  Her CT showed suggestion of gastric bezoar and low-grade SBO with transition point in RLQ - looks like bezoar material may also be stuck there vs fecalization  After inquiring about the CT findings, she reports that she used to ingest hospital gloves that she obtained at her dialysis center. She cut them into pieces and ingested. She states she knew this was bad for her but she that her desire to do so was quite strong. She denies suicidal ideation/attempts. She reports that she quit eating gloves 3 months ago. She reports having glove material in most of her BMs but noting that there has been no more glove material in the last 2 weeks.   Past Medical History:  Diagnosis Date  . Anemia   . Anxiety   . Dyspnea    "when I dont go to dialysis"  . Eczema   . ESRD (end stage renal disease) ([place])    [facility] - [facility]- [place]  . Exertional shortness of breath    "recently; it's fluid" (02/02/2013)  . GERD (gastroesophageal reflux disease)   . Headache   . History of blood transfusion    "last week" (02/02/2013)  . History of kidney stones    passed  . Hypertension   . Ischemic cardiomyopathy    by echo 2014  . Neuropathy   . Neuropathy   . Osteomyelitis of toe of left foot ([place])    "off and on since 2009; no OR" (02/02/2013)  . Pneumonia 09/2016  . Type II diabetes mellitus ([place]) 1995    Past Surgical History:  Procedure Laterality Date  . AMPUTATION Left 02/06/2013   Procedure: AMPUTATION LEFT GREAT TOE;   Surgeon: Wylene Simmer, MD;  Location: [facility];  Service: Orthopedics;  Laterality: Left;  . AMPUTATION Left 06/24/2013   Procedure: AMPUTATION BELOW KNEE ;  Surgeon: Wylene Simmer, MD;  Location: [facility];  Service: Orthopedics;  Laterality: Left;  . AV FISTULA PLACEMENT Right 02/08/2017   Procedure: CREATION OF RIGHT ARM  BASILIC VEIN TO BRACHIAL ARTERY ARTERIOVENOUS (AV) FISTULA;  Surgeon: Rosetta Posner, MD;  Location: [facility];  Service: Vascular;  Laterality: Right;  . AV FISTULA PLACEMENT Left 09/10/2017   Procedure: INSERTION OF ARTERIOVENOUS (AV) GORE-TEX GRAFT  LEFT UPPER ARM;  Surgeon: Angelia Mould, MD;  Location: [facility];  Service: Vascular;  Laterality: Left;  . BASCILIC VEIN TRANSPOSITION Right 04/14/2017   Procedure: BASCILIC VEIN TRANSPOSITION-RIGHT 2ND STAGE;  Surgeon: Rosetta Posner, MD;  Location: [facility];  Service: Vascular;  Laterality: Right;  . CESAREAN SECTION  10/18/2006  . INSERTION OF DIALYSIS CATHETER      Family History  Problem Relation Age of Onset  . Hypertension Mother   . Diabetes Mother   . Diabetes Father     Social:  reports that she quit smoking about 13 years ago. Her smoking use included cigarettes. She has a 30.00 pack-year smoking history. She has never used smokeless tobacco. She reports that she does not drink alcohol  or use drugs.  Allergies:  Allergies  Allergen Reactions  . Baclofen Itching  . Penicillins Anaphylaxis, Hives, Rash and Other (See Comments)    PATIENT HAD A PCN REACTION WITH IMMEDIATE RASH, FACIAL/TONGUE/THROAT SWELLING, SOB, OR LIGHTHEADEDNESS WITH HYPOTENSION:  #  #  #  YES  #  #  #   SEVERE RASH INVOLVING MUCUS MEMBRANES or SKIN NECROSIS: #  #  #  YES  #  #  # Has patient had a PCN reaction that required hospitalization No Has patient had a PCN reaction occurring within the last 10 years: No If all of the above answers are "NO", then may proceed with Cephalosporin use.  09/10/16- tolerated Cefepime   . Morphine And Related Hives  and Rash  . Novolog [Insulin Aspart] Other (See Comments)    Cramps/ Gi distress    Medications: I have reviewed the patient's current medications.  No results found for this or any previous visit (from the past 48 hour(s)).  No results found.  ROS - all of the below systems have been reviewed with the patient and positives are indicated with bold text General: chills, fever or night sweats Eyes: blurry vision or double vision ENT: epistaxis or sore throat Allergy/Immunology: itchy/watery eyes or nasal congestion Hematologic/Lymphatic: bleeding problems, blood clots or swollen lymph nodes Endocrine: temperature intolerance or unexpected weight changes Breast: new or changing breast lumps or nipple discharge Resp: cough, shortness of breath, or wheezing CV: chest pain or dyspnea on exertion GI: as per HPI GU: dysuria, trouble voiding, or hematuria MSK: joint pain or joint stiffness Neuro: TIA or stroke symptoms Derm: pruritus and skin lesion changes Psych: anxiety and depression  PE Blood pressure (!) 159/70, pulse 87, temperature 98.3 F (36.8 C), temperature source Oral, resp. rate 20, SpO2 95 %. Constitutional: NAD; conversant; no deformities Eyes: Moist conjunctiva; no lid lag; anicteric; PERRL Neck: Trachea midline; no thyromegaly Lungs: Normal respiratory effort; no tactile fremitus CV: RRR; no palpable thrills GI: Abd obese, soft, not significantly ttp, distention difficult to assess 2/2 habitus. No palpable hepatosplenomegaly MSK: L BKA; no clubbing/cyanosis Psychiatric: Appropriate affect; alert and oriented x3 Skin: Multiple scabs/excoriations all over.  Lymphatic: No palpable cervical or axillary lymphadenopathy   Imaging: CT A[facility]P from [facility] (imaging in our PACS, report on paper chart) 07/07/18 1. Mildly dilated fluid-filled loops of mid small bowel with transition to decompressed distal small bowel in RLQ, findings are consistent with a SBO likely due to  adhesion 2. Moderate to large amount of formed debris in the stomach, appearance if suspect for a gastric bezoar 3. Colon diverticula without acute inflammatory process 4. Trace amount of free fluid in the pelvis 5. Mild cardiomegaly  A/P: Karen [name] is an 42 y.o. female with possible SBO related to adhesion vs bezoar contents?  -NPO, NG tube -Small bowel protocol -MIVF as per medicine given dialysis patient -On her CT, there is "fecalization" proximal to the transition point and appears similar to the "fecalization" in her stomach. It's possible she has a partial obstruction related to the downstream migration of the hospital gloves -May benefit from GI evaluation for endoscopic removal of gastric bezoar contents to hopefully prevent further blockages down the road. -Keep room free of ingestible things, including gloves. -Psychiatry consult -We will follow with you  Sharon Mt. Dema Severin, M.D. [facility]

## 2018-07-07 NOTE — Progress Notes (Signed)
New Admission Note:  Arrival Method: Stretcher with EMS Mental Orientation: Alert and  Oriented x 4 Telemetry: N/A Assessment: Completed Skin: Lt  BKA. Skin cool and dry. Multiple eruptions/scabs to Rt Leg IV: NSL Pain: 10/10 abdomen Tubes: Rt nare NGT Safety Measures: Safety Fall Prevention Plan initiated.  Admission: Completed 5 M  Orientation: Patient has been orientated to the room, unit and the staff. Welcome booklet given.  Family: None  Orders have been reviewed and implemented. Will continue to monitor the patient. Call light has been placed within reach and bed alarm has been activated.   Sima Matas BSN, RN  Phone Number: [phone]

## 2018-07-08 ENCOUNTER — Inpatient Hospital Stay (HOSPITAL_COMMUNITY): Payer: Medicare Other

## 2018-07-08 ENCOUNTER — Encounter (HOSPITAL_COMMUNITY): Payer: Self-pay | Admitting: Internal Medicine

## 2018-07-08 DIAGNOSIS — Z992 Dependence on renal dialysis: Secondary | ICD-10-CM

## 2018-07-08 DIAGNOSIS — Z794 Long term (current) use of insulin: Secondary | ICD-10-CM

## 2018-07-08 DIAGNOSIS — T182XXA Foreign body in stomach, initial encounter: Secondary | ICD-10-CM

## 2018-07-08 DIAGNOSIS — K56609 Unspecified intestinal obstruction, unspecified as to partial versus complete obstruction: Secondary | ICD-10-CM

## 2018-07-08 DIAGNOSIS — E1165 Type 2 diabetes mellitus with hyperglycemia: Secondary | ICD-10-CM

## 2018-07-08 DIAGNOSIS — N186 End stage renal disease: Secondary | ICD-10-CM

## 2018-07-08 DIAGNOSIS — I10 Essential (primary) hypertension: Secondary | ICD-10-CM

## 2018-07-08 DIAGNOSIS — E1122 Type 2 diabetes mellitus with diabetic chronic kidney disease: Secondary | ICD-10-CM

## 2018-07-08 LAB — GLUCOSE, CAPILLARY
GLUCOSE-CAPILLARY: 165 mg/dL — AB (ref 70–99)
GLUCOSE-CAPILLARY: 251 mg/dL — AB (ref 70–99)
GLUCOSE-CAPILLARY: 226 mg/dL — AB (ref 70–99)
Glucose-Capillary: 177 mg/dL — ABNORMAL HIGH (ref 70–99)
Glucose-Capillary: 178 mg/dL — ABNORMAL HIGH (ref 70–99)
Glucose-Capillary: 119 mg/dL — ABNORMAL HIGH (ref 70–99)

## 2018-07-08 LAB — BASIC METABOLIC PANEL
ANION GAP: 12 (ref 5–15)
BUN: 37 mg/dL — AB (ref 6–20)
CHLORIDE: 100 mmol/L (ref 98–111)
CO2: 24 mmol/L (ref 22–32)
Calcium: 9.9 mg/dL (ref 8.9–10.3)
Creatinine, Ser: 6.45 mg/dL — ABNORMAL HIGH (ref 0.44–1.00)
GFR calc Af Amer: 8 mL/min — ABNORMAL LOW (ref >60)
GFR, EST NON AFRICAN AMERICAN: 7 mL/min — AB (ref >60)
GLUCOSE: 156 mg/dL — AB (ref 70–99)
POTASSIUM: 4.3 mmol/L (ref 3.5–5.1)
Sodium: 136 mmol/L (ref 135–145)

## 2018-07-08 LAB — MRSA PCR SCREENING: MRSA by PCR: NEGATIVE

## 2018-07-08 MED ORDER — CHLORHEXIDINE GLUCONATE CLOTH 2 % EX PADS
6.0000 | MEDICATED_PAD | Freq: Every day | CUTANEOUS | Status: DC
  Administered 2018-07-09 – 2018-07-10 (×2): 6 via TOPICAL

## 2018-07-08 MED ORDER — ACETAMINOPHEN 650 MG RE SUPP: 650.0000 mg | Freq: Four times a day (QID) | RECTAL | Status: DC | PRN

## 2018-07-08 MED ORDER — HYDRALAZINE HCL 20 MG/ML IJ SOLN: 10.0000 mg | INTRAMUSCULAR | Status: DC | PRN

## 2018-07-08 MED ORDER — INSULIN ASPART 100 UNIT/ML ~~LOC~~ SOLN
0.0000 [IU] | SUBCUTANEOUS | Status: DC
  Administered 2018-07-08: 3 [IU] via SUBCUTANEOUS
  Administered 2018-07-08: 5 [IU] via SUBCUTANEOUS
  Administered 2018-07-08: 2 [IU] via SUBCUTANEOUS

## 2018-07-08 MED ORDER — INSULIN ASPART 100 UNIT/ML ~~LOC~~ SOLN
0.0000 [IU] | Freq: Three times a day (TID) | SUBCUTANEOUS | Status: DC
  Administered 2018-07-08: 2 [IU] via SUBCUTANEOUS
  Administered 2018-07-09: 1 [IU] via SUBCUTANEOUS
  Administered 2018-07-09 – 2018-07-10 (×2): 2 [IU] via SUBCUTANEOUS

## 2018-07-08 MED ORDER — ACETAMINOPHEN 325 MG PO TABS
650.0000 mg | ORAL_TABLET | Freq: Four times a day (QID) | ORAL | Status: DC | PRN
  Filled 2018-07-08: qty 2

## 2018-07-08 MED ORDER — ONDANSETRON HCL 4 MG PO TABS: 4.0000 mg | ORAL_TABLET | Freq: Four times a day (QID) | ORAL | Status: DC | PRN

## 2018-07-08 MED ORDER — AMLODIPINE BESYLATE 10 MG PO TABS
10.0000 mg | ORAL_TABLET | Freq: Every day | ORAL | Status: DC
  Administered 2018-07-08 – 2018-07-10 (×3): 10 mg via ORAL
  Filled 2018-07-08 (×3): qty 1

## 2018-07-08 MED ORDER — ONDANSETRON HCL 4 MG/2ML IJ SOLN
4.0000 mg | Freq: Four times a day (QID) | INTRAMUSCULAR | Status: DC | PRN
  Administered 2018-07-08: 4 mg via INTRAVENOUS
  Filled 2018-07-08: qty 2

## 2018-07-08 MED ORDER — METOPROLOL SUCCINATE 12.5 MG HALF TABLET
12.5000 mg | ORAL_TABLET | Freq: Every day | ORAL | Status: DC
  Administered 2018-07-08 – 2018-07-10 (×3): 12.5 mg via ORAL
  Filled 2018-07-08 (×3): qty 1

## 2018-07-08 MED ORDER — ONDANSETRON HCL 4 MG/2ML IJ SOLN: 4.0000 mg | Freq: Four times a day (QID) | INTRAMUSCULAR | Status: DC | PRN

## 2018-07-08 NOTE — Progress Notes (Signed)
[facility] PROGRESS NOTE  Karen [name] BZJ:[id number] DOB: 1976/06/12 DOA: 07/07/2018  PCP: Lillard Anes, MD  Brief History/Interval Summary: 42 y.o. female with history of ESRD on hemodialysis on Monday Wednesday Friday, diabetes mellitus type 2, hypertension, anemia, status post lower extremity amputation, peripheral artery disease, ischemic cardiomyopathy, presented to the ER at [facility] with complaints of abdominal pain nausea vomiting.  Evaluation included a CT scan which showed findings suggestive of small bowel obstruction.  There was also significant debris noted in the stomach.  NG tube was placed.  Patient was transferred to [place] surgery was consulted.  Reason for Visit: Small bowel obstruction  Consultants: General surgery.  Gastroenterology.  Psychiatry.  Nephrology  Procedures: To be dialyzed today.  Antibiotics: None  Subjective/Interval History: Patient is a poor historian.  She states that she has had multiple bowel movements this morning.  Denies any nausea vomiting.  No abdominal pain currently.  ROS: Denies any shortness of breath  Objective:  Vital Signs  Vitals:   07/07/18 2219 07/08/18 0600 07/08/18 0729 07/08/18 1200  BP: (!) 159/70 (!) 216/124 (!) 160/79 (!) (P) 204/111  Pulse: 87 96 97 (P) 95  Resp: 20 18 18  (P) 16  Temp: 98.3 F (36.8 C) 98.9 F (37.2 C) 98.7 F (37.1 C) (P) 98.3 F (36.8 C)  TempSrc: Oral Oral Oral (P) Oral  SpO2: 95% 97% 98% (P) 98%    Intake/Output Summary (Last 24 hours) at 07/08/2018 1220 Last data filed at 07/08/2018 1012 Gross per 24 hour  Intake 220 ml  Output 300 ml  Net -80 ml   There were no vitals filed for this visit.  General appearance: alert, cooperative, appears stated age, distracted and no distress Resp: clear to auscultation bilaterally Cardio: regular rate and rhythm, S1, S2 normal, no murmur, click, rub or gallop GI: Abdomen soft.  Bowel sounds  are present.  No masses organomegaly.  Nontender. Extremities: Status post left lower extremity amputation Neurologic: No obvious focal neurological deficits appreciated  Lab Results:  Data Reviewed: I have personally reviewed following labs and imaging studies  CBC: Recent Labs  Lab 07/07/18 2238  WBC 8.1  NEUTROABS 5.6  HGB 11.4*  HCT 39.5  MCV 86.1  PLT 123*    Basic Metabolic Panel: Recent Labs  Lab 07/07/18 2238  NA 138  K 5.0  CL 94*  CO2 27  GLUCOSE 162*  BUN 44*  CREATININE 8.09*  CALCIUM 10.5*    GFR: CrCl cannot be calculated (Unknown ideal weight.).  Liver Function Tests: Recent Labs  Lab 07/07/18 2238  AST 18  ALT 28  ALKPHOS 88  BILITOT 0.9  PROT 8.8*  ALBUMIN 3.7    CBG: Recent Labs  Lab 07/08/18 0715 07/08/18 1119  GLUCAP 226* 177*     Recent Results (from the past 240 hour(s))  MRSA PCR Screening     Status: None   Collection Time: 07/07/18 10:01 PM  Result Value Ref Range Status   MRSA by PCR NEGATIVE NEGATIVE Final    Comment:        The GeneXpert MRSA Assay (FDA approved for NASAL specimens only), is one component of a comprehensive MRSA colonization surveillance program. It is not intended to diagnose MRSA infection nor to guide or monitor treatment for MRSA infections. Performed at [facility], [place] [address] [place], [place] [address]       Radiology Studies: Dg Abd Portable 1v-small Bowel Obstruction Protocol-initial,  8 Hr Delay  Result Date: 07/08/2018 CLINICAL DATA:  Small bowel obstruction. EXAM: PORTABLE ABDOMEN - 1 VIEW COMPARISON:  06/27/2018 and CT from 07/07/2018 FINDINGS: Portable supine image of the abdomen was obtained. Nasogastric tube is no longer identified. Again noted is a large amount of gastric contents. Gas-filled loops of small bowel in the mid abdomen are again noted and small bowel loops measure up to 3.2 cm. The degree of small bowel distension is unchanged. Again noted is stool in  the rectum. Limited evaluation for free air on this supine image. IMPRESSION: Bowel gas pattern is unchanged. There continues to be gas-filled loops of small bowel in the mid abdomen and suggestive for a small bowel obstruction. Nasogastric tube is no longer identified. Electronically Signed   By: Markus Daft M.D.   On: 07/08/2018 10:31   Dg Abd Portable 1v  Result Date: 07/07/2018 CLINICAL DATA:  NG tube placement.  Small bowel obstruction. EXAM: PORTABLE ABDOMEN - 1 VIEW COMPARISON:  CT earlier this day. FINDINGS: Tip and side port of the enteric tube below the diaphragm in the stomach. Stomach distended with heterogeneous contents. Mildly dilated small bowel in the central abdomen. No evidence of free air. IMPRESSION: 1. Tip and side port of the enteric tube below the diaphragm in the stomach. 2. Small bowel dilatation as seen on recent CT consistent with small bowel obstruction. Stomach distended with heterogeneous contents. Electronically Signed   By: Keith Rake M.D.   On: 07/07/2018 23:03     Medications:  Scheduled: . Chlorhexidine Gluconate Cloth  6 each Topical Q0600  . insulin aspart  0-9 Units Subcutaneous Q4H   Continuous:  ELF:[id number] **OR** acetaminophen, fentaNYL (SUBLIMAZE) injection, hydrALAZINE, ondansetron **OR** ondansetron (ZOFRAN) IV  Assessment/Plan:    Small bowel obstruction General surgery is following.  Patient has had multiple bowel movements.  NG tube discontinued by general surgery.  Continue to monitor the patient closely.  Clear liquid diets.  Significant gastric distention with debris CT scan apparently showed a lot of debris in the stomach.  Patient apparently has a habit of stealing hospital gloves from her dialysis center. She then cuts them into small pieces and eats them.  There is evidence for gastric bezoar.  Discussed with gastroenterology who recommends keeping the patient just on clear liquids.  Depending on her clinical condition may  have to consider doing endoscopy however right there is no acute indication to do so plus the fact that her stomach is filled with significant amount of debris would make it very difficult to do upper endoscopy to begin with.  Patient clearly has underlying psychiatric issues.  Psychiatry has been consulted.  Accelerated hypertension Blood pressure noted to be quite elevated.  Resume some of her oral antihypertensives.  Hydralazine as needed.  Diabetes mellitus type 2 Continue to monitor CBGs.  SSI.  End-stage renal disease on hemodialysis on Monday Wednesday Friday Nephrology consulted.  Patient dialyzed today.  Chronic systolic and diastolic CHF Echocardiogram from December 2018 showed EF to be 35 to 40%.  Grade 3 diastolic dysfunction was noted.  Volume being managed with hemodialysis.  Normocytic anemia Most likely due to chronic kidney disease.  No evidence of overt bleeding.  Continue to monitor.  Mild thrombocytopenia Continue to monitor.  DVT Prophylaxis: SCDs    Code Status: Full code Family Communication: Discussed with the patient.  No family at bedside Disposition Plan: Management as outlined above.    LOS: 1 day   [facility]  Pager [phone] 07/08/2018, 12:20 PM  If 7PM-7AM, please contact night-coverage at [email or web address], password [facility]

## 2018-07-08 NOTE — Procedures (Signed)
   I was present at this dialysis session, have reviewed the session itself and made  appropriate changes Kelly Splinter MD [place] pager [phone]   07/08/2018, 1:27 PM

## 2018-07-08 NOTE — Progress Notes (Signed)
With 2.25hrs remaining of dialysis treatment patient insist on ended treatment.  Patient stated she didn't feel well and wanted to her treatment.  Veneta Penton, PA was present at the bedside.  Orders received to honor patient request and discontinue treatment.  Orders followed as ordered.  Report called to primary RN and transported to room by hospital transport.

## 2018-07-08 NOTE — H&P (Signed)
History and Physical    Karen [name] IZT:[id number] DOB: 02-23-1976 DOA: 07/07/2018  PCP: [name]  Patient coming from: Patient was transferred from [name].  Chief Complaint: Abdominal pain nausea vomiting.  HPI: Karen [name] is a 42 y.o. female with history of ESRD on hemodialysis on Monday Wednesday Friday, diabetes mellitus type 2, hypertension, anemia presented to the ER at [name] with complaints of abdominal pain nausea vomiting.  Patient has been having the symptoms for last 3 days which is acutely worsened.  Abdominal pain is diffuse.  Denies any blood in the vomitus.  ED Course: In the ER at Karen [name] CT abdomen and pelvis done shows features consistent with small bowel obstruction.  NG tube was placed and on-call general surgeon at [name] was consulted.  Patient transferred to [name] for further management.  Labs appear to be at baseline.  On my exam patient is abdomen distended with poor bowel sounds.  NG tube suction is draining fluid.  Review of Systems: As per HPI, rest all negative.   Past Medical History:  Diagnosis Date  . Anemia   . Anxiety   . Dyspnea    "when I dont go to dialysis"  . Eczema   . ESRD (end stage renal disease) ([place])    Hemo - TTHSAT-   . Exertional shortness of breath    "recently; it's fluid" (02/02/2013)  . GERD (gastroesophageal reflux disease)   . Headache   . History of blood transfusion    "last week" (02/02/2013)  . History of kidney stones    passed  . Hypertension   . Ischemic cardiomyopathy    by echo 2014  . Neuropathy   . Neuropathy   . Osteomyelitis of toe of left foot (Karen [name])    "off and on since 2009; no OR" (02/02/2013)  . Pneumonia 09/2016  . Type II diabetes mellitus (Karen [name]) 1995    Past Surgical History:  Procedure Laterality Date  . AMPUTATION Left 02/06/2013   Procedure: AMPUTATION LEFT GREAT TOE;  Surgeon: Wylene Simmer, [name];  Location: [place];  Service:  Orthopedics;  Laterality: Left;  . AMPUTATION Left 06/24/2013   Procedure: AMPUTATION BELOW KNEE ;  Surgeon: Wylene Simmer, [name];  Location: [place];  Service: Orthopedics;  Laterality: Left;  . AV FISTULA PLACEMENT Right 02/08/2017   Procedure: CREATION OF RIGHT ARM  BASILIC VEIN TO BRACHIAL ARTERY ARTERIOVENOUS (AV) FISTULA;  Surgeon: Rosetta Posner, [name];  Location: [place] [facility];  Service: Vascular;  Laterality: Right;  . AV FISTULA PLACEMENT Left 09/10/2017   Procedure: INSERTION OF ARTERIOVENOUS (AV) GORE-TEX GRAFT  LEFT UPPER ARM;  Surgeon: Angelia Mould, [name];  Location: [place] [name];  Service: Vascular;  Laterality: Left;  . BASCILIC VEIN TRANSPOSITION Right 04/14/2017   Procedure: BASCILIC VEIN TRANSPOSITION-RIGHT 2ND STAGE;  Surgeon: Rosetta Posner, [name];  Location: [place];  Service: Vascular;  Laterality: Right;  . CESAREAN SECTION  10/18/2006  . INSERTION OF DIALYSIS CATHETER       reports that she quit smoking about 13 years ago. Her smoking use included cigarettes. She has a 30.00 pack-year smoking history. She has never used smokeless tobacco. She reports that she does not drink alcohol or use drugs.  Allergies  Allergen Reactions  . Baclofen Itching  . Penicillins Anaphylaxis, Hives, Rash and Other (See Comments)    PATIENT HAD A PCN REACTION WITH IMMEDIATE RASH, FACIAL/TONGUE/THROAT SWELLING, SOB, OR LIGHTHEADEDNESS WITH HYPOTENSION:  #  #  #  YES  #  #  #   SEVERE RASH INVOLVING MUCUS MEMBRANES or SKIN NECROSIS: #  #  #  YES  #  #  # Has patient had a PCN reaction that required hospitalization No Has patient had a PCN reaction occurring within the last 10 years: No If all of the above answers are "NO", then may proceed with Cephalosporin use.  09/10/16- tolerated Cefepime   . Morphine And Related Hives and Rash  . Novolog [Insulin Aspart] Other (See Comments)    Cramps/ Gi distress    Family History  Problem Relation Age of Onset  . Hypertension Mother   . Diabetes Mother   . Diabetes  Father     Prior to Admission medications   Medication Sig Start Date End Date Taking? Authorizing Provider  amLODipine (NORVASC) 10 MG tablet Take 10 mg by mouth daily.    Provider, Historical, [name]  aspirin 325 MG tablet Take 1 tablet (325 mg total) by mouth daily. 09/16/17   Melanee Spry, [name]  atorvastatin (LIPITOR) 80 MG tablet TAKE 1 TABLET(80 MG) BY MOUTH DAILY AT 6 PM 09/16/17   Lacroce, Hulen Shouts, [name]  calcitRIOL (ROCALTROL) 0.5 MCG capsule Take 3 capsules (1.5 mcg total) by mouth Every Tuesday,Thursday,and Saturday with dialysis. 11/13/17   Debbe Odea, [name]  calcium acetate (PHOSLO) 667 MG capsule Take 2 capsules (1,334 mg total) by mouth 3 (three) times daily with meals. 11/13/17   Debbe Odea, [name]  cinacalcet (SENSIPAR) 30 MG tablet Take 1 tablet (30 mg total) by mouth Every Tuesday,Thursday,and Saturday with dialysis. 11/13/17   Debbe Odea, [name]  Darbepoetin Alfa (ARANESP) 200 MCG/0.4ML SOSY injection Inject 0.4 mLs (200 mcg total) into the vein every Thursday with hemodialysis. 02/03/18   Bonnita Hollow, [name]  ferric gluconate 62.5 mg in sodium chloride 0.9 % 100 mL Inject 62.5 mg into the vein every Thursday with hemodialysis. 02/03/18   Bonnita Hollow, [name]  insulin regular (NOVOLIN R,HUMULIN R) 100 units/mL injection Inject 0-0.09 mLs (0-9 Units total) into the skin 3 (three) times daily with meals. CBG < 70:0. Check CBG CBG 70 - 120: 0 units  CBG 121 - 150:1 unit  CBG 151 - 200:2 units  CBG 201 - 250:3 units  CBG 251 - 300:5 units  CBG 301 - 350:7 units  CBG 351 - 400:9 units  CBG > 400:call [name] 02/01/18   Bonnita Hollow, [name]  metoprolol succinate (TOPROL-XL) 25 MG 24 hr tablet Take 0.5 tablets (12.5 mg total) by mouth daily. 02/02/18 03/04/18  Bonnita Hollow, [name]  multivitamin (RENA-VIT) TABS tablet Take 1 tablet by mouth at bedtime. 11/13/17   Debbe Odea, [name]  polyethylene glycol (MIRALAX / GLYCOLAX) packet Take 17 g by mouth daily. 02/02/18   Bonnita Hollow, [name]     Physical Exam: Vitals:   07/07/18 2219  BP: (!) 159/70  Pulse: 87  Resp: 20  Temp: 98.3 F (36.8 C)  TempSrc: Oral  SpO2: 95%      Constitutional: Moderately built and nourished. Vitals:   07/07/18 2219  BP: (!) 159/70  Pulse: 87  Resp: 20  Temp: 98.3 F (36.8 C)  TempSrc: Oral  SpO2: 95%   Eyes: Anicteric no pallor. ENMT: No discharge from the ears eyes nose or mouth. Neck: No mass or.  No neck rigidity. Respiratory: No rhonchi or crepitations. Cardiovascular: S1-S2 heard no murmurs appreciated. Abdomen: Soft nondistended bowel sounds not appreciated. Musculoskeletal: Left below-knee amputation. Skin: Chronic skin  changes. Neurologic: Alert awake oriented to time place and person.  Moves all extremities. Psychiatric: Appears normal per normal affect.   Labs on Admission: I have personally reviewed following labs and imaging studies  CBC: Recent Labs  Lab 07/07/18 2238  WBC 8.1  NEUTROABS 5.6  HGB 11.4*  HCT 39.5  MCV 86.1  PLT 950*   Basic Metabolic Panel: Recent Labs  Lab 07/07/18 2238  NA 138  K 5.0  CL 94*  CO2 27  GLUCOSE 162*  BUN 44*  CREATININE 8.09*  CALCIUM 10.5*   GFR: CrCl cannot be calculated (Unknown ideal weight.). Liver Function Tests: Recent Labs  Lab 07/07/18 2238  AST 18  ALT 28  ALKPHOS 88  BILITOT 0.9  PROT 8.8*  ALBUMIN 3.7   No results for input(s): LIPASE, AMYLASE in the last 168 hours. No results for input(s): AMMONIA in the last 168 hours. Coagulation Profile: No results for input(s): INR, PROTIME in the last 168 hours. Cardiac Enzymes: No results for input(s): CKTOTAL, CKMB, CKMBINDEX, TROPONINI in the last 168 hours. BNP (last 3 results) No results for input(s): PROBNP in the last 8760 hours. HbA1C: No results for input(s): HGBA1C in the last 72 hours. CBG: No results for input(s): GLUCAP in the last 168 hours. Lipid Profile: No results for input(s): CHOL, HDL, LDLCALC, TRIG, CHOLHDL, LDLDIRECT  in the last 72 hours. Thyroid Function Tests: No results for input(s): TSH, T4TOTAL, FREET4, T3FREE, THYROIDAB in the last 72 hours. Anemia Panel: No results for input(s): VITAMINB12, FOLATE, FERRITIN, TIBC, IRON, RETICCTPCT in the last 72 hours. Urine analysis:    Component Value Date/Time   COLORURINE YELLOW 09/09/2016 [place] 09/09/2016 0554   LABSPEC 1.015 09/09/2016 0554   PHURINE 7.0 09/09/2016 0554   GLUCOSEU >=500 (A) 09/09/2016 0554   HGBUR NEGATIVE 09/09/2016 0554   BILIRUBINUR NEGATIVE 09/09/2016 0554   KETONESUR NEGATIVE 09/09/2016 0554   PROTEINUR >=300 (A) 09/09/2016 0554   UROBILINOGEN 0.2 02/03/2013 0038   NITRITE NEGATIVE 09/09/2016 0554   LEUKOCYTESUR NEGATIVE 09/09/2016 0554   Sepsis Labs: @LABRCNTIP (procalcitonin:4,lacticidven:4) ) Recent Results (from the past 240 hour(s))  MRSA PCR Screening     Status: None   Collection Time: 07/07/18 10:01 PM  Result Value Ref Range Status   MRSA by PCR NEGATIVE NEGATIVE Final    Comment:        The GeneXpert MRSA Assay (FDA approved for NASAL specimens only), is one component of a comprehensive MRSA colonization surveillance program. It is not intended to diagnose MRSA infection nor to guide or monitor treatment for MRSA infections. Performed at [facility], [place] [address], [place], [place] [address]      Radiological Exams on Admission: Dg Abd Portable 1v  Result Date: 07/07/2018 CLINICAL DATA:  NG tube placement.  Small bowel obstruction. EXAM: PORTABLE ABDOMEN - 1 VIEW COMPARISON:  CT earlier this day. FINDINGS: Tip and side port of the enteric tube below the diaphragm in the stomach. Stomach distended with heterogeneous contents. Mildly dilated small bowel in the central abdomen. No evidence of free air. IMPRESSION: 1. Tip and side port of the enteric tube below the diaphragm in the stomach. 2. Small bowel dilatation as seen on recent CT consistent with small bowel obstruction.  Stomach distended with heterogeneous contents. Electronically Signed   By: Keith Rake M.D.   On: 07/07/2018 23:03      Assessment/Plan Principal Problem:   SBO (small bowel obstruction) ([facility]) Active Problems:   Essential  hypertension   PAD (peripheral artery disease) ([facility])   Cardiomyopathy, ischemic - EF 45-50% with inf WMA by 2D 02/05/13   Hx of BKA ([place])   Uncontrolled type 2 diabetes mellitus with chronic kidney disease on chronic dialysis, with long-term current use of insulin ([facility])   ESRD on dialysis [place] [name])   Homelessness   Small bowel obstruction ([place])    1. Small bowel obstruction -appreciate general [name] consult.  Patient will be kept n.p.o. continue with NG tube suction.  Pain relief medications.  Follow KUBs. 2. Hypertension since patient is n.p.o. we will keep patient on PRN IV hydralazine. 3. Diabetes mellitus type 2 -patient usually takes Lantus for now we will keep patient on sliding scale coverage. 4. ESRD on hemodialysis on Monday Wednesday Friday -please consult nephrology for dialysis.  Does not appear to be in fluid overload. 5. Anemia likely from ESRD -follow CBC. 6. Mild thrombocytopenia -follow CBC.  I have reviewed patient's charts from [facility].   DVT prophylaxis: SCDs in anticipation of possible procedure. Code Status: Full code. Family Communication: Discussed with patient. Disposition Plan: Home. Consults called: General [name]. Admission status: Inpatient.   Rise Patience [name] Pager [phone].  If 7PM-7AM, please contact night-coverage [email or web address] Password [email or web address]  07/08/2018, 1:01 AM

## 2018-07-08 NOTE — Progress Notes (Signed)
   Subjective/Chief Complaint: Pt reports two BMs this AM with glover particulate in it. States abd pain is better   Objective: Vital signs in last 24 hours: Temp:  [98.3 F (36.8 C)-98.9 F (37.2 C)] 98.7 F (37.1 C) (10/18 0729) Pulse Rate:  [87-97] 97 (10/18 0729) Resp:  [18-20] 18 (10/18 0729) BP: (159-216)/(70-124) 160/79 (10/18 0729) SpO2:  [95 %-98 %] 98 % (10/18 0729) Last BM Date: 07/08/18  Intake/Output from previous day: 10/17 0701 - 10/18 0700 In: 0  Out: 300 [Emesis/NG output:300] Intake/Output this shift: No intake/output data recorded.  Constitutional: No acute distress, conversant, appears states age. Eyes: Anicteric sclerae, moist conjunctiva, no lid lag Lungs: Clear to auscultation bilaterally, normal respiratory effort CV: regular rate and rhythm, no murmurs, no peripheral edema, pedal pulses 2+ GI: Soft, no masses or hepatosplenomegaly, non-tender to palpation Skin: No rashes, palpation reveals normal turgor Psychiatric: appropriate judgment and insight, oriented to person, place, and time   Lab Results:  Recent Labs    07/07/18 2238  WBC 8.1  HGB 11.4*  HCT 39.5  PLT 123*   BMET Recent Labs    07/07/18 2238  NA 138  K 5.0  CL 94*  CO2 27  GLUCOSE 162*  BUN 44*  CREATININE 8.09*  CALCIUM 10.5*   PT/INR No results for input(s): LABPROT, INR in the last 72 hours. ABG No results for input(s): PHART, HCO3 in the last 72 hours.  Invalid input(s): PCO2, PO2  Studies/Results: Dg Abd Portable 1v  Result Date: 07/07/2018 CLINICAL DATA:  NG tube placement.  Small bowel obstruction. EXAM: PORTABLE ABDOMEN - 1 VIEW COMPARISON:  CT earlier this day. FINDINGS: Tip and side port of the enteric tube below the diaphragm in the stomach. Stomach distended with heterogeneous contents. Mildly dilated small bowel in the central abdomen. No evidence of free air. IMPRESSION: 1. Tip and side port of the enteric tube below the diaphragm in the stomach.  2. Small bowel dilatation as seen on recent CT consistent with small bowel obstruction. Stomach distended with heterogeneous contents. Electronically Signed   By: Keith Rake M.D.   On: 07/07/2018 23:03    Anti-infectives: Anti-infectives (From admission, onward)   None      Assessment/Plan: 42 y.o. female with resolved SBO related to bezoar contents Past Medical History:  Diagnosis Date  . Anemia   . Anxiety   . Dyspnea    "when I dont go to dialysis"  . Eczema   . ESRD (end stage renal disease) ([place])    Hemo - TTHSAT- [place]  . Exertional shortness of breath    "recently; it's fluid" (02/02/2013)  . GERD (gastroesophageal reflux disease)   . Headache   . History of blood transfusion    "last week" (02/02/2013)  . History of kidney stones    passed  . Hypertension   . Ischemic cardiomyopathy    by echo 2014  . Neuropathy   . Neuropathy   . Osteomyelitis of toe of left foot ([place])    "off and on since 2009; no OR" (02/02/2013)  . Pneumonia 09/2016  . Type II diabetes mellitus ([place]) 1995   1.  Will DC NGT and start liquids.  Can adv diet as tol 2. No plans for surgery.  If pt does become obstructed she would need cardiac clearance. 3.  Please call if we can be of any assistance.      LOS: 1 day    Ralene Ok 07/08/2018

## 2018-07-08 NOTE — Consult Note (Signed)
Referring Provider: [facility]  Primary Care Physician:  Lillard Anes, MD Primary Gastroenterologist:   Althia Forts Reason for Consultation:    Bowel obstruction    ASSESSMENT AND PLAN:    42 yo female with ESRD on HD transferred to [facility] from [facility] presented with small bowel obstruction.. CT scan yesterday as well as a follow-up KUB today shows large amount of content and stomach as well as distended small bowel loops.  The CT scan yesterday suggested transition point in the RLQ. -Patient has a long history of cutting her upper gloves and ingesting them.  Last ingestion 2 to 3 weeks ago. This material may be responsible for partial GOO and SBO  -No longer requiring NG tube.  She has been having bowel movements (apparently some with rubber glove matter) and is tolerating clears.  -while clinically improved her follow up KUB today is really unchanged compared with CT scan.  -EGD not feasible right now. With all the debris in stomach she is at risk for aspiration and would need intubation. Furthermore, not sure we would be able to physically remove the debris. Best case scenario is that she spontaneously passes the debris.  -Clears today as tolerated. Will follow along.     HPI: Karen [name] is a 42 y.o. female with multiple medical problems not limited to hypertension , ischemic cardiomyopathy , DM2, PAD status post left BKA , ESRD on HD.  History of medical noncompliance.  Patient was transferred from [facility] yesterday with small bowel obstruction.  He presented there with nausea, vomiting and right lower quadrant pain.  CT scan suggest a gastric bezoar and low-grade SBO with transition in the right lower quadrant, possibly from bezoar or fecalization.  Patient gives a long history of eating rubber globs which she apparently gets from the dialysis center she cuts them into pieces and eats them.  Patient would not quantify but said she has ingested a lot of  them for very long time but none in 3 weeks.  She denies previous history of SBO from this activity.  Prior to now patient said her bowels always move fine at home, no chronic abdominal pain and no chronic problems with nausea or vomiting.  Feels the globs of passed from her system since she has had several bowel movements containing rubber glove matter and also tolerating clear today.     CTAP from [place] done 07/07/2018  dilated fluid-filled loops of mid small bowel with transition to decompressed distal small bowel and right lower quadrant.  Moderate to large amount of formed debris in the stomach possibly gastric bezoar  KUB this a.m. Persistent large amount of gastric contents and gas-filled loops of small bowel in the mid abdomen suggestive for small bowel obstruction  Past Medical History:  Diagnosis Date  . Anemia   . Anxiety   . Dyspnea    "when I dont go to dialysis"  . Eczema   . ESRD (end stage renal disease) ([place])    Hemo - TTHSAT- [place]  . Exertional shortness of breath    "recently; it's fluid" (02/02/2013)  . GERD (gastroesophageal reflux disease)   . Headache   . History of blood transfusion    "last week" (02/02/2013)  . History of kidney stones    passed  . Hypertension   . Ischemic cardiomyopathy    by echo 2014  . Neuropathy   . Neuropathy   . Osteomyelitis of toe of left foot ([place])    "  off and on since 2009; no OR" (02/02/2013)  . Pneumonia 09/2016  . Type II diabetes mellitus ([place]) 1995    Past Surgical History:  Procedure Laterality Date  . AMPUTATION Left 02/06/2013   Procedure: AMPUTATION LEFT GREAT TOE;  Surgeon: Wylene Simmer, MD;  Location: [place];  Service: Orthopedics;  Laterality: Left;  . AMPUTATION Left 06/24/2013   Procedure: AMPUTATION BELOW KNEE ;  Surgeon: Wylene Simmer, MD;  Location: [place];  Service: Orthopedics;  Laterality: Left;  . AV FISTULA PLACEMENT Right 02/08/2017   Procedure: CREATION OF RIGHT ARM  BASILIC VEIN TO BRACHIAL ARTERY  ARTERIOVENOUS (AV) FISTULA;  Surgeon: Rosetta Posner, MD;  Location: [facility];  Service: Vascular;  Laterality: Right;  . AV FISTULA PLACEMENT Left 09/10/2017   Procedure: INSERTION OF ARTERIOVENOUS (AV) GORE-TEX GRAFT  LEFT UPPER ARM;  Surgeon: Angelia Mould, MD;  Location: [place];  Service: Vascular;  Laterality: Left;  . BASCILIC VEIN TRANSPOSITION Right 04/14/2017   Procedure: BASCILIC VEIN TRANSPOSITION-RIGHT 2ND STAGE;  Surgeon: Rosetta Posner, MD;  Location: [place];  Service: Vascular;  Laterality: Right;  . CESAREAN SECTION  10/18/2006  . INSERTION OF DIALYSIS CATHETER      Prior to Admission medications   Medication Sig Start Date End Date Taking? Authorizing Provider  amLODipine (NORVASC) 10 MG tablet Take 10 mg by mouth daily.    [provider]  aspirin 325 MG tablet Take 1 tablet (325 mg total) by mouth daily. 09/16/17   Melanee Spry, MD  atorvastatin (LIPITOR) 80 MG tablet TAKE 1 TABLET(80 MG) BY MOUTH DAILY AT 6 PM 09/16/17   Lacroce, Hulen Shouts, MD  calcitRIOL (ROCALTROL) 0.5 MCG capsule Take 3 capsules (1.5 mcg total) by mouth Every Tuesday,Thursday,and Saturday with dialysis. 11/13/17   Debbe Odea, MD  calcium acetate (PHOSLO) 667 MG capsule Take 2 capsules (1,334 mg total) by mouth 3 (three) times daily with meals. 11/13/17   Debbe Odea, MD  cinacalcet (SENSIPAR) 30 MG tablet Take 1 tablet (30 mg total) by mouth Every Tuesday,Thursday,and Saturday with dialysis. 11/13/17   Debbe Odea, MD  Darbepoetin Alfa (ARANESP) 200 MCG/0.4ML SOSY injection Inject 0.4 mLs (200 mcg total) into the vein every Thursday with hemodialysis. 02/03/18   Bonnita Hollow, MD  ferric gluconate 62.5 mg in sodium chloride 0.9 % 100 mL Inject 62.5 mg into the vein every Thursday with hemodialysis. 02/03/18   Bonnita Hollow, MD  insulin regular (NOVOLIN R,HUMULIN R) 100 units/mL injection Inject 0-0.09 mLs (0-9 Units total) into the skin 3 (three) times daily with meals. CBG < 70:0.  Check CBG CBG 70 - 120: 0 units  CBG 121 - 150:1 unit  CBG 151 - 200:2 units  CBG 201 - 250:3 units  CBG 251 - 300:5 units  CBG 301 - 350:7 units  CBG 351 - 400:9 units  CBG > 400:call MD 02/01/18   Bonnita Hollow, MD  metoprolol succinate (TOPROL-XL) 25 MG 24 hr tablet Take 0.5 tablets (12.5 mg total) by mouth daily. 02/02/18 03/04/18  Bonnita Hollow, MD  multivitamin (RENA-VIT) TABS tablet Take 1 tablet by mouth at bedtime. 11/13/17   Debbe Odea, MD  polyethylene glycol (MIRALAX / GLYCOLAX) packet Take 17 g by mouth daily. 02/02/18   Bonnita Hollow, MD    Current Facility-Administered Medications  Medication Dose Route Frequency Provider Last Rate Last Dose  . acetaminophen (TYLENOL) tablet 650 mg  650 mg Oral Q6H PRN Rise Patience, MD  Or  . acetaminophen (TYLENOL) suppository 650 mg  650 mg Rectal Q6H PRN Rise Patience, MD      . Chlorhexidine Gluconate Cloth 2 % PADS 6 each  6 each Topical Q0600 Loren Racer, PA-C      . fentaNYL (SUBLIMAZE) injection 25 mcg  25 mcg Intravenous Q2H PRN Rise Patience, MD   25 mcg at 07/08/18 0817  . hydrALAZINE (APRESOLINE) injection 10 mg  10 mg Intravenous Q4H PRN Rise Patience, MD      . insulin aspart (novoLOG) injection 0-9 Units  0-9 Units Subcutaneous Q4H Rise Patience, MD   2 Units at 07/08/18 1146  . ondansetron (ZOFRAN) tablet 4 mg  4 mg Oral Q6H PRN Rise Patience, MD       Or  . ondansetron [facility]) injection 4 mg  4 mg Intravenous Q6H PRN Rise Patience, MD   4 mg at 07/08/18 0113    Allergies as of 07/07/2018 - Review Complete 07/07/2018  Allergen Reaction Noted  . Baclofen Itching 10/15/2017  . Penicillins Anaphylaxis, Hives, Rash, and Other (See Comments)   . Morphine and related Hives and Rash   . Novolog [insulin aspart] Other (See Comments) 07/10/2013    Family History  Problem Relation Age of Onset  . Hypertension Mother   . Diabetes Mother   . Diabetes  Father     Social History   Socioeconomic History  . Marital status: Single    Spouse name: Not on file  . Number of children: Not on file  . Years of education: Not on file  . Highest education level: Not on file  Occupational History  . Not on file  Social Needs  . Financial resource strain: Not on file  . Food insecurity:    Worry: Not on file    Inability: Not on file  . Transportation needs:    Medical: Not on file    Non-medical: Not on file  Tobacco Use  . Smoking status: Former Smoker    Packs/day: 3.00    Years: 10.00    Pack years: 30.00    Types: Cigarettes    Last attempt to quit: 09/21/2004    Years since quitting: 13.8  . Smokeless tobacco: Never Used  Substance and Sexual Activity  . Alcohol use: No  . Drug use: No  . Sexual activity: Never    Birth control/protection: None  Lifestyle  . Physical activity:    Days per week: Not on file    Minutes per session: Not on file  . Stress: Not on file  Relationships  . Social connections:    Talks on phone: Not on file    Gets together: Not on file    Attends religious service: Not on file    Active member of club or organization: Not on file    Attends meetings of clubs or organizations: Not on file    Relationship status: Not on file  . Intimate partner violence:    Fear of current or ex partner: Not on file    Emotionally abused: Not on file    Physically abused: Not on file    Forced sexual activity: Not on file  Other Topics Concern  . Not on file  Social History Narrative  . Not on file    Review of Systems: All systems reviewed and negative except where noted in HPI.  Physical Exam: Vital signs in last 24 hours: Temp:  [98.3 F (36.8  C)-98.9 F (37.2 C)] 98.7 F (37.1 C) (10/18 0729) Pulse Rate:  [87-97] 97 (10/18 0729) Resp:  [18-20] 18 (10/18 0729) BP: (159-216)/(70-124) 160/79 (10/18 0729) SpO2:  [95 %-98 %] 98 % (10/18 0729) Last BM Date: 07/08/18 General:   Alert, black female  in HD in NAD.  Psych:  cooperative. Flat affect. Eyes:  Pupils equal, sclera clear, no icterus.  Ears:  Normal auditory acuity. Nose:  No deformity, discharge,  or lesions. Neck:  Supple; no masses Lungs:  Normal respiratory effort.  Heart:  Regular rate and rhythm Abdomen:  Soft, non-distended, nontender, BS active, no palp mass    Rectal:  Deferred  Msk:  Symmetrical without gross deformities. . Neurologic:  Alert and  oriented x4;  grossly normal neurologically. Skin:  Intact without significant lesions or rashes..   Intake/Output from previous day: 10/17 0701 - 10/18 0700 In: 0  Out: 300 [Emesis/NG output:300] Intake/Output this shift: Total I/O In: 220 [P.O.:220] Out: -   Lab Results: Recent Labs    07/07/18 2238  WBC 8.1  HGB 11.4*  HCT 39.5  PLT 123*   BMET Recent Labs    07/07/18 2238  NA 138  K 5.0  CL 94*  CO2 27  GLUCOSE 162*  BUN 44*  CREATININE 8.09*  CALCIUM 10.5*   LFT Recent Labs    07/07/18 2238  PROT 8.8*  ALBUMIN 3.7  AST 18  ALT 28  ALKPHOS 88  BILITOT 0.9     Studies/Results: Dg Abd Portable 1v-small Bowel Obstruction Protocol-initial, 8 Hr Delay  Result Date: 07/08/2018 CLINICAL DATA:  Small bowel obstruction. EXAM: PORTABLE ABDOMEN - 1 VIEW COMPARISON:  06/27/2018 and CT from 07/07/2018 FINDINGS: Portable supine image of the abdomen was obtained. Nasogastric tube is no longer identified. Again noted is a large amount of gastric contents. Gas-filled loops of small bowel in the mid abdomen are again noted and small bowel loops measure up to 3.2 cm. The degree of small bowel distension is unchanged. Again noted is stool in the rectum. Limited evaluation for free air on this supine image. IMPRESSION: Bowel gas pattern is unchanged. There continues to be gas-filled loops of small bowel in the mid abdomen and suggestive for a small bowel obstruction. Nasogastric tube is no longer identified. Electronically Signed   By: Markus Daft M.D.    On: 07/08/2018 10:31   Dg Abd Portable 1v  Result Date: 07/07/2018 CLINICAL DATA:  NG tube placement.  Small bowel obstruction. EXAM: PORTABLE ABDOMEN - 1 VIEW COMPARISON:  CT earlier this day. FINDINGS: Tip and side port of the enteric tube below the diaphragm in the stomach. Stomach distended with heterogeneous contents. Mildly dilated small bowel in the central abdomen. No evidence of free air. IMPRESSION: 1. Tip and side port of the enteric tube below the diaphragm in the stomach. 2. Small bowel dilatation as seen on recent CT consistent with small bowel obstruction. Stomach distended with heterogeneous contents. Electronically Signed   By: Keith Rake M.D.   On: 07/07/2018 23:03     Tye Savoy, NP-C @  07/08/2018, 12:04 PM

## 2018-07-08 NOTE — Consult Note (Addendum)
Renal Service Consult Note [facility]  Karen [name] 07/08/2018 Requesting Physician:  Dr Curly Rim  Reason for Consult:  ESRD pt w/ abd pain   Karen [name] is a 42 y.o. female with PMHx ESRD (d/t diabetic nephropathy, on HD since 2017), HTN, PAD (s/p BKA), Type 2 DM who was admitted with SBO. She presented to [facility] with 3 days of N/V, followed by 1 day of worsened abdominal pain. She denied having chest pain, dyspnea, fever or chills. ED labs showed K 5, BUN 44, Cr 8.09, Ca 10.5, WBC 8.1, Hgb 11.4. Abdominal CT showed possible gastric bezoar and low-grade SBO. On questioning it was discovered that she has been eating nitrite exam gloves. Called and verified this with her outpatient HD unit. RN tells me that they have been trying to keep her from doing this, but they have caught her stealing used gloves from the trash bins to eat later.  NG tube placed for gastric decompression. This morning she began passing diarrhea. She has been allowed to eat clear foods which she is tolerating at this time. Abd pain much improved.  From a renal standpoint, she dialyzes on MWF schedule - last was 10/16. Uses L AVF as her access, no recent issues.   ROS  denies CP  no joint pain   no HA  no blurry vision  no rash  no diarrhea  no nausea/ vomiting  no dysuria  no difficulty voiding  no change in urine color    Past Medical History  Past Medical History:  Diagnosis Date  . Anemia   . Anxiety   . Dyspnea    "when I dont go to dialysis"  . Eczema   . ESRD (end stage renal disease) ([place])    Hemo - TTHSAT- [place]  . Exertional shortness of breath    "recently; it's fluid" (02/02/2013)  . GERD (gastroesophageal reflux disease)   . Headache   . History of blood transfusion    "last week" (02/02/2013)  . History of kidney stones    passed  . Hypertension   . Ischemic cardiomyopathy    by echo 2014  . Neuropathy   . Neuropathy   . Osteomyelitis of toe of left  foot ([place])    "off and on since 2009; no OR" (02/02/2013)  . Pneumonia 09/2016  . Type II diabetes mellitus ([place]) 1995   Past Surgical History  Past Surgical History:  Procedure Laterality Date  . AMPUTATION Left 02/06/2013   Procedure: AMPUTATION LEFT GREAT TOE;  Surgeon: Wylene Simmer, MD;  Location: [place];  Service: Orthopedics;  Laterality: Left;  . AMPUTATION Left 06/24/2013   Procedure: AMPUTATION BELOW KNEE ;  Surgeon: Wylene Simmer, MD;  Location: [place];  Service: Orthopedics;  Laterality: Left;  . AV FISTULA PLACEMENT Right 02/08/2017   Procedure: CREATION OF RIGHT ARM  BASILIC VEIN TO BRACHIAL ARTERY ARTERIOVENOUS (AV) FISTULA;  Surgeon: Rosetta Posner, MD;  Location: [place] [facility];  Service: Vascular;  Laterality: Right;  . AV FISTULA PLACEMENT Left 09/10/2017   Procedure: INSERTION OF ARTERIOVENOUS (AV) GORE-TEX GRAFT  LEFT UPPER ARM;  Surgeon: Angelia Mould, MD;  Location: [place];  Service: Vascular;  Laterality: Left;  . BASCILIC VEIN TRANSPOSITION Right 04/14/2017   Procedure: BASCILIC VEIN TRANSPOSITION-RIGHT 2ND STAGE;  Surgeon: Rosetta Posner, MD;  Location: [place];  Service: Vascular;  Laterality: Right;  . CESAREAN SECTION  10/18/2006  . INSERTION OF DIALYSIS CATHETER     Family  History  Family History  Problem Relation Age of Onset  . Hypertension Mother   . Diabetes Mother   . Diabetes Father    Social History  reports that she quit smoking about 13 years ago. Her smoking use included cigarettes. She has a 30.00 pack-year smoking history. She has never used smokeless tobacco. She reports that she does not drink alcohol or use drugs. Allergies  Allergies  Allergen Reactions  . Baclofen Itching  . Penicillins Anaphylaxis, Hives, Rash and Other (See Comments)    PATIENT HAD A PCN REACTION WITH IMMEDIATE RASH, FACIAL/TONGUE/THROAT SWELLING, SOB, OR LIGHTHEADEDNESS WITH HYPOTENSION:  #  #  #  YES  #  #  #   SEVERE RASH INVOLVING MUCUS MEMBRANES or SKIN NECROSIS: #  #  #  YES  #   #  # Has patient had a PCN reaction that required hospitalization No Has patient had a PCN reaction occurring within the last 10 years: No If all of the above answers are "NO", then may proceed with Cephalosporin use.  09/10/16- tolerated Cefepime   . Morphine And Related Hives and Rash  . Novolog [Insulin Aspart] Other (See Comments)    Cramps/ Gi distress   Home medications Prior to Admission medications   Medication Sig Start Date End Date Taking? Authorizing Provider  amLODipine (NORVASC) 10 MG tablet Take 10 mg by mouth daily.    [provider]  aspirin 325 MG tablet Take 1 tablet (325 mg total) by mouth daily. 09/16/17   Melanee Spry, MD  atorvastatin (LIPITOR) 80 MG tablet TAKE 1 TABLET(80 MG) BY MOUTH DAILY AT 6 PM 09/16/17   Lacroce, Hulen Shouts, MD  calcitRIOL (ROCALTROL) 0.5 MCG capsule Take 3 capsules (1.5 mcg total) by mouth Every Tuesday,Thursday,and Saturday with dialysis. 11/13/17   Debbe Odea, MD  calcium acetate (PHOSLO) 667 MG capsule Take 2 capsules (1,334 mg total) by mouth 3 (three) times daily with meals. 11/13/17   Debbe Odea, MD  cinacalcet (SENSIPAR) 30 MG tablet Take 1 tablet (30 mg total) by mouth Every Tuesday,Thursday,and Saturday with dialysis. 11/13/17   Debbe Odea, MD  Darbepoetin Alfa (ARANESP) 200 MCG/0.4ML SOSY injection Inject 0.4 mLs (200 mcg total) into the vein every Thursday with hemodialysis. 02/03/18   Bonnita Hollow, MD  ferric gluconate 62.5 mg in sodium chloride 0.9 % 100 mL Inject 62.5 mg into the vein every Thursday with hemodialysis. 02/03/18   Bonnita Hollow, MD  insulin regular (NOVOLIN R,HUMULIN R) 100 units/mL injection Inject 0-0.09 mLs (0-9 Units total) into the skin 3 (three) times daily with meals. CBG < 70:0. Check CBG CBG 70 - 120: 0 units  CBG 121 - 150:1 unit  CBG 151 - 200:2 units  CBG 201 - 250:3 units  CBG 251 - 300:5 units  CBG 301 - 350:7 units  CBG 351 - 400:9 units  CBG > 400:call MD 02/01/18    Bonnita Hollow, MD  metoprolol succinate (TOPROL-XL) 25 MG 24 hr tablet Take 0.5 tablets (12.5 mg total) by mouth daily. 02/02/18 03/04/18  Bonnita Hollow, MD  multivitamin (RENA-VIT) TABS tablet Take 1 tablet by mouth at bedtime. 11/13/17   Debbe Odea, MD  polyethylene glycol (MIRALAX / GLYCOLAX) packet Take 17 g by mouth daily. 02/02/18   Bonnita Hollow, MD   Liver Function Tests Recent Labs  Lab 07/07/18 2238  AST 18  ALT 28  ALKPHOS 88  BILITOT 0.9  PROT 8.8*  ALBUMIN 3.7  CBC Recent Labs  Lab 07/07/18 2238  WBC 8.1  NEUTROABS 5.6  HGB 11.4*  HCT 39.5  MCV 86.1  PLT 322*   Basic Metabolic Panel Recent Labs  Lab 07/07/18 2238  NA 138  K 5.0  CL 94*  CO2 27  GLUCOSE 162*  BUN 44*  CREATININE 8.09*  CALCIUM 10.5*   Iron/TIBC/Ferritin/ %Sat    Component Value Date/Time   IRON 31 (L) 02/05/2013 0327   TIBC 155 (L) 02/05/2013 0327   FERRITIN 127 02/05/2013 0327   IRONPCTSAT 20 02/05/2013 0327    Vitals:   07/07/18 2219 07/08/18 0600 07/08/18 0729  BP: (!) 159/70 (!) 216/124 (!) 160/79  Pulse: 87 96 97  Resp: 20 18 18   Temp: 98.3 F (36.8 C) 98.9 F (37.2 C) 98.7 F (37.1 C)  TempSrc: Oral Oral Oral  SpO2: 95% 97% 98%   PHYSICAL EXAM: Gen alert, no distress, calm and pleasant No rash, cyanosis or gangrene Sclera anicteric, throat clear  No jvd or bruits Chest coarse BS bases, o/w clear  RRR no MRG  Abd soft ntnd no mass or ascites +bs  GU defer MS no joint effusions or deformity Ext no LE or UE edema, no wounds or ulcers Neuro is alert, Ox 3 , nf LUA AVF+ bruit   Dialysis orders:  MWF at [facility] (last name [facility] in [facility] system) 4hr, 400/800, EDW 90kg, 2K/2.25Ca, AVG, UF profile 2, heparin 6000 + 2000 mid-run bolus - Venofer 100mg  x 5 (in process) - Mircera 137mcg IV q 2 weeks - Hectoral 71mcg Iv q HD   Impression/ Plan: 1. 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 Bowels seem to be moving at this time -- no surgical plans for now. See surgical note, may benefit from GI consult for endoscopic removal of gastric bezoar. Psych consult pending. 2. ESRD: Will continue HD per usual MWF schedule. HD today.  3. BP/volume: Under EDW -- low UF goal. Follow. 4. Anemia of ESRD: Hgb 11.4, no ESA for now. 5. Secondary HPTH: Ca high, will hold off on Hectoral for now and  follow. 6. DM: On insulin, per primary.  Veneta Penton, PA-C [employer] Pager [phone]  Pt seen, examined and agree w A/P as above.  Kelly Splinter MD [employer] pager [phone]   07/08/2018, 1:27 PM

## 2018-07-08 NOTE — Consult Note (Signed)
[facility] Consult   Reason for Consult:  Eating rubber gloves Referring Physician:  Dr. [place] Pink Patient Identification: Karen [name] MRN:  [id number] Principal Diagnosis: SBO (small bowel obstruction) [facility]) Diagnosis:   Patient Active Problem List   Diagnosis Date Noted  . SBO (small bowel obstruction) ([place]) [K56.609] 07/08/2018  . Small bowel obstruction ([place]) [K56.609] 07/08/2018  . Weakness [R53.1]   . Acute on chronic systolic CHF (congestive heart failure) ([place]) [I50.23]   . ESRD on dialysis ([place]) [N18.6, Z99.2]   . Homelessness [Z59.0]   . Late effect of cerebrovascular accident (CVA) [I69.30]   . Dyspnea [R06.00] 01/27/2018  . Hypertension, uncontrolled [I10] 11/11/2017  . Fluid overload [E87.70] 11/11/2017  . Abdominal pain [R10.9]   . Dysarthria [R47.1] 09/14/2017  . Lacunar infarct, acute ([place]) [I63.81] 09/14/2017  . Obesity [E66.9] 09/09/2017  . Chest pain [R07.9] 09/07/2017  . Fluid overload, unspecified [E87.70] 10/03/2016  . Hyperkalemia [E87.5] 10/03/2016  . Hypertension with fluid overload [I10, E87.79] 10/03/2016  . Anemia in other chronic diseases classified elsewhere [D63.8] 09/08/2016  . High anion gap metabolic acidosis [C94.4] 09/08/2016  . Right lower lobe pneumonia ([place]) [J18.1] 09/08/2016  . 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 Noncompliance with medications [Z91.14]   . End-stage renal disease on hemodialysis ([place]) [N18.6, Z99.2]   . Hx of BKA ([place]) [Z89.519] 07/03/2013  . S/P BKA (below knee amputation) unilateral ([place]) [Z89.519] 06/30/2013  . Physical deconditioning [R53.81] 06/30/2013  . Cardiomyopathy, ischemic - EF 45-50% with inf WMA by 2D 02/05/13 [I25.5] 02/06/2013  . Essential hypertension [I10] 02/05/2013  . PAD (peripheral artery disease) ([place]) [I73.9] 02/05/2013  . Diabetic  neuropathy ([place]) [E11.40]   . Osteomyelitis of left great toe - S/P amputation 02/06/13 [M86.9] 04/21/2011    Total Time spent with patient: 15 minutes  Subjective:   Karen [name] is a 42 y.o. female patient admitted with small bowel obstruction after cutting up and eating hospital rubber gloves.  Patient reports today that she does not feel that she wants to eat regular labs anymore.  She states that she uses hoping that she can go to the bathroom soon after having a small bowel obstruction.  Patient denies any suicidal or homicidal ideations.  Patient also denies any hallucinations.  Patient was specifically asked if this was an intent to harm herself and she denied.  Patient was also asked specifically if she was having any command hallucinations to tell her to swallow these rubber gloves and she denied.  Patient also states that she does not feel that she needs to do it anymore and she could not explain why she was doing that.  HPI:  42 y.o.femalewithhistory of ESRD on hemodialysis on Monday Wednesday Friday, diabetes mellitus type 2, hypertension, anemia, status post lower extremity amputation, peripheral artery disease, ischemic cardiomyopathy, presented to the ER at [facility] with complaints of abdominal pain nausea vomiting.  Evaluation included a CT scan which showed findings suggestive of small bowel obstruction.  There was also significant debris noted in the stomach.  NG tube was placed.  Patient was transferred to [facility].    Past Psychiatric History: Patient denied any psychiatric history in after chart review history could not find any additional information for any psychiatric history.  Risk to Self:   Risk to Others:   Prior Inpatient Therapy:   Prior Outpatient  Therapy:    Past Medical History:  Past Medical History:  Diagnosis Date  . Anemia   . Anxiety   . Dyspnea    "when I dont go to dialysis"  . Eczema   . ESRD (end stage renal disease) ([place])     [place] - [facility]- [place]  . Exertional shortness of breath    "recently; it's fluid" (02/02/2013)  . GERD (gastroesophageal reflux disease)   . Headache   . History of blood transfusion    "last week" (02/02/2013)  . History of kidney stones    passed  . Hypertension   . Ischemic cardiomyopathy    by echo 2014  . Neuropathy   . Neuropathy   . Osteomyelitis of toe of left foot ([place])    "off and on since 2009; no OR" (02/02/2013)  . Pneumonia 09/2016  . Type II diabetes mellitus ([place]) 1995    Past Surgical History:  Procedure Laterality Date  . AMPUTATION Left 02/06/2013   Procedure: AMPUTATION LEFT GREAT TOE;  Surgeon: Wylene Simmer, MD;  Location: [facility];  Service: Orthopedics;  Laterality: Left;  . AMPUTATION Left 06/24/2013   Procedure: AMPUTATION BELOW KNEE ;  Surgeon: Wylene Simmer, MD;  Location: [facility];  Service: Orthopedics;  Laterality: Left;  . AV FISTULA PLACEMENT Right 02/08/2017   Procedure: CREATION OF RIGHT ARM  BASILIC VEIN TO BRACHIAL ARTERY ARTERIOVENOUS (AV) FISTULA;  Surgeon: Rosetta Posner, MD;  Location: [facility];  Service: Vascular;  Laterality: Right;  . AV FISTULA PLACEMENT Left 09/10/2017   Procedure: INSERTION OF ARTERIOVENOUS (AV) GORE-TEX GRAFT  LEFT UPPER ARM;  Surgeon: Angelia Mould, MD;  Location: [facility];  Service: Vascular;  Laterality: Left;  . BASCILIC VEIN TRANSPOSITION Right 04/14/2017   Procedure: BASCILIC VEIN TRANSPOSITION-RIGHT 2ND STAGE;  Surgeon: Rosetta Posner, MD;  Location: [facility];  Service: Vascular;  Laterality: Right;  . CESAREAN SECTION  10/18/2006  . INSERTION OF DIALYSIS CATHETER     Family History:  Family History  Problem Relation Age of Onset  . Hypertension Mother   . Diabetes Mother   . Diabetes Father    Family Psychiatric  History: Denies Social History:  Social History   Substance and Sexual Activity  Alcohol Use No     Social History   Substance and Sexual Activity  Drug Use No    Social History   Socioeconomic  History  . Marital status: Single    Spouse name: Not on file  . Number of children: Not on file  . Years of education: Not on file  . Highest education level: Not on file  Occupational History  . Not on file  Social Needs  . Financial resource strain: Not on file  . Food insecurity:    Worry: Not on file    Inability: Not on file  . Transportation needs:    Medical: Not on file    Non-medical: Not on file  Tobacco Use  . Smoking status: Former Smoker    Packs/day: 3.00    Years: 10.00    Pack years: 30.00    Types: Cigarettes    Last attempt to quit: 09/21/2004    Years since quitting: 13.8  . Smokeless tobacco: Never Used  Substance and Sexual Activity  . Alcohol use: No  . Drug use: No  . Sexual activity: Never    Birth control/protection: None  Lifestyle  . Physical activity:    Days per week: Not on file  Minutes per session: Not on file  . Stress: Not on file  Relationships  . Social connections:    Talks on phone: Not on file    Gets together: Not on file    Attends religious service: Not on file    Active member of club or organization: Not on file    Attends meetings of clubs or organizations: Not on file    Relationship status: Not on file  Other Topics Concern  . Not on file  Social History Narrative  . Not on file   Additional Social History:    Allergies:   Allergies  Allergen Reactions  . Baclofen Itching  . Penicillins Anaphylaxis, Hives, Rash and Other (See Comments)    PATIENT HAD A PCN REACTION WITH IMMEDIATE RASH, FACIAL/TONGUE/THROAT SWELLING, SOB, OR LIGHTHEADEDNESS WITH HYPOTENSION:  #  #  #  YES  #  #  #   SEVERE RASH INVOLVING MUCUS MEMBRANES or SKIN NECROSIS: #  #  #  YES  #  #  # Has patient had a PCN reaction that required hospitalization No Has patient had a PCN reaction occurring within the last 10 years: No If all of the above answers are "NO", then may proceed with Cephalosporin use.  09/10/16- tolerated Cefepime   .  Morphine And Related Hives and Rash  . Novolog [Insulin Aspart] Other (See Comments)    Cramps/ Gi distress    Labs:  Results for orders placed or performed during the hospital encounter of 07/07/18 (from the past 48 hour(s))  MRSA PCR Screening     Status: None   Collection Time: 07/07/18 10:01 PM  Result Value Ref Range   MRSA by PCR NEGATIVE NEGATIVE    Comment:        The GeneXpert MRSA Assay (FDA approved for NASAL specimens only), is one component of a comprehensive MRSA colonization surveillance program. It is not intended to diagnose MRSA infection nor to guide or monitor treatment for MRSA infections. Performed at [facility], [place] [address] [place], [place] [address]   Comprehensive metabolic panel     Status: Abnormal   Collection Time: 07/07/18 10:38 PM  Result Value Ref Range   Sodium 138 135 - 145 mmol/L   Potassium 5.0 3.5 - 5.1 mmol/L   Chloride 94 (L) 98 - 111 mmol/L   CO2 27 22 - 32 mmol/L   Glucose, Bld 162 (H) 70 - 99 mg/dL   BUN 44 (H) 6 - 20 mg/dL   Creatinine, Ser 8.09 (H) 0.44 - 1.00 mg/dL   Calcium 10.5 (H) 8.9 - 10.3 mg/dL   Total Protein 8.8 (H) 6.5 - 8.1 g/dL   Albumin 3.7 3.5 - 5.0 g/dL   AST 18 15 - 41 U/L   ALT 28 0 - 44 U/L   Alkaline Phosphatase 88 38 - 126 U/L   Total Bilirubin 0.9 0.3 - 1.2 mg/dL   GFR calc non Af Amer 6 (L) >60 mL/min   GFR calc Af Amer 6 (L) >60 mL/min    Comment: (NOTE) The eGFR has been calculated using the CKD EPI equation. This calculation has not been validated in all clinical situations. eGFR's persistently <60 mL/min signify possible Chronic Kidney Disease.    Anion gap 17 (H) 5 - 15    Comment: Performed at [facility], [place] [address], [place], [place] [address]  CBC with Differential/Platelet     Status: Abnormal   Collection Time: 07/07/18 10:38 PM  Result Value Ref Range   WBC 8.1 4.0 - 10.5 K/uL   RBC 4.59 3.87 - 5.11 MIL/uL   Hemoglobin 11.4 (L) 12.0 - 15.0 g/dL   HCT 39.5 36.0 -  46.0 %   MCV 86.1 80.0 - 100.0 fL   MCH 24.8 (L) 26.0 - 34.0 pg   MCHC 28.9 (L) 30.0 - 36.0 g/dL   RDW 19.2 (H) 11.5 - 15.5 %   Platelets 123 (L) 150 - 400 K/uL   nRBC 0.0 0.0 - 0.2 %   Neutrophils Relative % 69 %   Neutro Abs 5.6 1.7 - 7.7 K/uL   Lymphocytes Relative 18 %   Lymphs Abs 1.5 0.7 - 4.0 K/uL   Monocytes Relative 10 %   Monocytes Absolute 0.8 0.1 - 1.0 K/uL   Eosinophils Relative 2 %   Eosinophils Absolute 0.2 0.0 - 0.5 K/uL   Basophils Relative 0 %   Basophils Absolute 0.0 0.0 - 0.1 K/uL   Immature Granulocytes 1 %   Abs Immature Granulocytes 0.04 0.00 - 0.07 K/uL    Comment: Performed at [facility], [address], [place], [place] [address]  Lactic acid, plasma     Status: None   Collection Time: 07/07/18 10:45 PM  Result Value Ref Range   Lactic Acid, Venous 1.3 0.5 - 1.9 mmol/L    Comment: Performed at [facility] [address] [place], [place] [address]  Glucose, capillary     Status: Abnormal   Collection Time: 07/08/18  7:15 AM  Result Value Ref Range   Glucose-Capillary 226 (H) 70 - 99 mg/dL  Glucose, capillary     Status: Abnormal   Collection Time: 07/08/18 11:19 AM  Result Value Ref Range   Glucose-Capillary 177 (H) 70 - 99 mg/dL  Basic metabolic panel     Status: Abnormal   Collection Time: 07/08/18 12:41 PM  Result Value Ref Range   Sodium 136 135 - 145 mmol/L   Potassium 4.3 3.5 - 5.1 mmol/L   Chloride 100 98 - 111 mmol/L   CO2 24 22 - 32 mmol/L   Glucose, Bld 156 (H) 70 - 99 mg/dL   BUN 37 (H) 6 - 20 mg/dL   Creatinine, Ser 6.45 (H) 0.44 - 1.00 mg/dL   Calcium 9.9 8.9 - 10.3 mg/dL   GFR calc non Af Amer 7 (L) >60 mL/min   GFR calc Af Amer 8 (L) >60 mL/min    Comment: (NOTE) The eGFR has been calculated using the CKD EPI equation. This calculation has not been validated in all clinical situations. eGFR's persistently <60 mL/min signify possible Chronic Kidney Disease.    Anion gap 12 5 - 15    Comment: Performed at  [facility] [address], [place], [place] [address]    Current Facility-Administered Medications  Medication Dose Route Frequency Provider Last Rate Last Dose  . acetaminophen (TYLENOL) tablet 650 mg  650 mg Oral Q6H PRN Rise Patience, MD       Or  . acetaminophen (TYLENOL) suppository 650 mg  650 mg Rectal Q6H PRN Rise Patience, MD      . amLODipine (NORVASC) tablet 10 mg  10 mg Oral Daily Bonnielee Haff, MD      . Chlorhexidine Gluconate Cloth 2 % PADS 6 each  6 each Topical Q0600 Loren Racer, PA-C      . fentaNYL (SUBLIMAZE) injection 25 mcg  25 mcg Intravenous Q2H PRN Gean Birchwood  N, MD   25 mcg at 07/08/18 0817  . hydrALAZINE (APRESOLINE) injection 10 mg  10 mg Intravenous Q4H PRN Bonnielee Haff, MD      . insulin aspart (novoLOG) injection 0-9 Units  0-9 Units Subcutaneous TID WC Bonnielee Haff, MD      . metoprolol succinate (TOPROL-XL) 24 hr tablet 12.5 mg  12.5 mg Oral Daily Bonnielee Haff, MD      . ondansetron [facility]) tablet 4 mg  4 mg Oral Q6H PRN Rise Patience, MD       Or  . ondansetron [facility]) injection 4 mg  4 mg Intravenous Q6H PRN Rise Patience, MD   4 mg at 07/08/18 0113    Musculoskeletal: Strength & Muscle Tone: decreased Gait & Station: unable to stand, left BKA Patient leans: N/A  Psychiatric Specialty Exam: Physical Exam  Nursing note and vitals reviewed. Constitutional: She is oriented to person, place, and time. She appears well-developed and well-nourished.  Cardiovascular: Normal rate.  Respiratory: Effort normal.  Neurological: She is alert and oriented to person, place, and time.  Skin: Skin is warm.    Review of Systems  Constitutional: Negative.   HENT: Negative.   Eyes: Negative.   Respiratory: Negative.   Cardiovascular: Negative.   Gastrointestinal: Positive for abdominal pain and constipation (SBO).  Genitourinary: Negative.   Musculoskeletal: Negative.   Skin: Negative.    Neurological: Negative.   Endo/Heme/Allergies: Negative.   Psychiatric/Behavioral: Negative for depression, hallucinations, substance abuse and suicidal ideas. The patient is not nervous/anxious.     Blood pressure 125/73, pulse 92, temperature (!) 97.4 F (36.3 C), temperature source Oral, resp. rate 16, weight 88.3 kg, SpO2 98 %.Body mass index is 26.4 kg/m.  General Appearance: Disheveled  Eye Contact:  Fair  Speech:  Garbled and Slow  Volume:  Decreased  Mood:  Euthymic  Affect:  Flat  Thought Process:  Linear and Descriptions of Associations: Intact  Orientation:  Full (Time, Place, and Person)  Thought Content:  WDL  Suicidal Thoughts:  No  Homicidal Thoughts:  No  Memory:  Recent;   Good  Judgement:  Fair  Insight:  Fair  Psychomotor Activity:  Decreased  Concentration:  Concentration: Fair  Recall:  Good  Fund of Knowledge:  Good  Language:  Poor  Akathisia:  No  Handed:  Right  AIMS (if indicated):     Assets:  Communication Skills Social Support  ADL's:  Impaired  Cognition:  WNL  Sleep:        Treatment Plan Summary: Follow up aith outpatient psychiatry and therapist for potential obsessive-compulsive disorder  Disposition: No evidence of imminent risk to self or others at present.   Patient does not meet criteria for psychiatric inpatient admission. Discussed crisis plan, support from social network, calling 911, coming to the Emergency Department, and calling Suicide Hotline.  [place], FNP 07/08/2018 3:19 PM

## 2018-07-09 DIAGNOSIS — I255 Ischemic cardiomyopathy: Secondary | ICD-10-CM

## 2018-07-09 DIAGNOSIS — K56600 Partial intestinal obstruction, unspecified as to cause: Secondary | ICD-10-CM

## 2018-07-09 LAB — TROPONIN I
Troponin I: 0.06 ng/mL (ref <0.03)
Troponin I: 0.05 ng/mL (ref <0.03)

## 2018-07-09 LAB — GLUCOSE, CAPILLARY
GLUCOSE-CAPILLARY: 122 mg/dL — AB (ref 70–99)
GLUCOSE-CAPILLARY: 173 mg/dL — AB (ref 70–99)
Glucose-Capillary: 123 mg/dL — ABNORMAL HIGH (ref 70–99)
Glucose-Capillary: 173 mg/dL — ABNORMAL HIGH (ref 70–99)

## 2018-07-09 MED ORDER — OXYCODONE HCL 5 MG PO TABS
5.0000 mg | ORAL_TABLET | Freq: Once | ORAL | Status: AC
  Administered 2018-07-09: 5 mg via ORAL
  Filled 2018-07-09: qty 1

## 2018-07-09 MED ORDER — NITROGLYCERIN 0.4 MG SL SUBL
0.4000 mg | SUBLINGUAL_TABLET | SUBLINGUAL | Status: DC | PRN
  Filled 2018-07-09: qty 1

## 2018-07-09 MED ORDER — TAMSULOSIN HCL 0.4 MG PO CAPS: 0.4000 mg | ORAL_CAPSULE | Freq: Every day | ORAL | Status: DC

## 2018-07-09 NOTE — Progress Notes (Signed)
Progress Note   Subjective  Patient seen in her room Sitting on the edge of the bed Reports had a good bowel movement last night which was formed and then loose; contained glove material Begging for solid food Denies abdominal pain, nausea and vomiting Had dialysis yesterday but did not complete session No labs today or abd xray   Objective   Vital signs in last 24 hours: Temp:  [97.4 F (36.3 C)-98.4 F (36.9 C)] 98.4 F (36.9 C) (10/19 0934) Pulse Rate:  [75-95] 84 (10/19 0934) Resp:  [14-20] 18 (10/19 0934) BP: (98-204)/(66-111) 148/85 (10/19 0934) SpO2:  [94 %-99 %] 98 % (10/19 0934) Weight:  [88.3 kg-89.9 kg] 88.3 kg (10/18 2109) Last BM Date: 07/08/18 Gen: awake, alert, NAD HEENT: anicteric, op clear CV: RRR, no mrg Pulm: CTA b/l Abd: soft, obese, NT/ND, +BS throughout Ext: no c/c/e, left bka Neuro: nonfocal   Intake/Output from previous day: 10/18 0701 - 10/19 0700 In: 860 [P.O.:860] Out: 1252  Intake/Output this shift: Total I/O In: 240 [P.O.:240] Out: -   Lab Results: Recent Labs    07/07/18 2238  WBC 8.1  HGB 11.4*  HCT 39.5  PLT 123*   BMET Recent Labs    07/07/18 2238 07/08/18 1241  NA 138 136  K 5.0 4.3  CL 94* 100  CO2 27 24  GLUCOSE 162* 156*  BUN 44* 37*  CREATININE 8.09* 6.45*  CALCIUM 10.5* 9.9   LFT Recent Labs    07/07/18 2238  PROT 8.8*  ALBUMIN 3.7  AST 18  ALT 28  ALKPHOS 88  BILITOT 0.9   PT/INR No results for input(s): LABPROT, INR in the last 72 hours.  Studies/Results: Dg Abd Portable 1v-small Bowel Obstruction Protocol-initial, 8 Hr Delay  Result Date: 07/08/2018 CLINICAL DATA:  Small bowel obstruction. EXAM: PORTABLE ABDOMEN - 1 VIEW COMPARISON:  06/27/2018 and CT from 07/07/2018 FINDINGS: Portable supine image of the abdomen was obtained. Nasogastric tube is no longer identified. Again noted is a large amount of gastric contents. Gas-filled loops of small bowel in the mid abdomen are again noted and  small bowel loops measure up to 3.2 cm. The degree of small bowel distension is unchanged. Again noted is stool in the rectum. Limited evaluation for free air on this supine image. IMPRESSION: Bowel gas pattern is unchanged. There continues to be gas-filled loops of small bowel in the mid abdomen and suggestive for a small bowel obstruction. Nasogastric tube is no longer identified. Electronically Signed   By: Markus Daft M.D.   On: 07/08/2018 10:31   Dg Abd Portable 1v  Result Date: 07/07/2018 CLINICAL DATA:  NG tube placement.  Small bowel obstruction. EXAM: PORTABLE ABDOMEN - 1 VIEW COMPARISON:  CT earlier this day. FINDINGS: Tip and side port of the enteric tube below the diaphragm in the stomach. Stomach distended with heterogeneous contents. Mildly dilated small bowel in the central abdomen. No evidence of free air. IMPRESSION: 1. Tip and side port of the enteric tube below the diaphragm in the stomach. 2. Small bowel dilatation as seen on recent CT consistent with small bowel obstruction. Stomach distended with heterogeneous contents. Electronically Signed   By: Keith Rake M.D.   On: 07/07/2018 23:03       Assessment / Plan:   42 year old female with end-stage renal disease on dialysis, probable OCD transferred from [facility] with a partial small bowel obstruction due to previously ingested medical gloves  1.  Foreign body ingestion/partial bowel  obstruction/retained gastric contents versus bezoar --at present she is on a liquid diet though has graham crackers and peanut butter at bedside which she states she is eating and tolerating.  Had bowel movement which argues against persistent obstruction --Ultimately she needs to stop ingesting foreign body/gloves; psychiatry saw the patient recommends outpatient follow-up for probable OCD --We will advance to a dysphagia 2/chopped diet but would not use regular diet or high residue foods given retained gastric contents --We will need  time for foreign body to empty from gut; given overall improvement would not pursue EGD at this time as I do not think material could be removed endoscopically (given volume apparent in the stomach by x-ray and CT) --Monitor for recurrent obstructive symptoms --GI will sign off, call with questions      Principal Problem:   SBO (small bowel obstruction) ([place]) Active Problems:   Essential hypertension   PAD (peripheral artery disease) ([place])   Cardiomyopathy, ischemic - EF 45-50% with inf WMA by 2D 02/05/13   Hx of BKA ([place])   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

## 2018-07-09 NOTE — Progress Notes (Signed)
   07/09/18 0042  Pain Assessment  Pain Scale 0-10  Pain Score 6  Pain Type Acute pain  Pain Location Chest  Pain Orientation Mid  Pain Descriptors / Indicators Sharp  Pain Frequency Constant  Pain Onset Sudden  Patients Stated Pain Goal 2  Pain Intervention(s) MD notified (Comment);Emotional support  Provider Notification  Provider Name/Title C. Bodenheimer NP  Date Provider Notified 07/09/18  Time Provider Notified [phone]  Notification Type Page  Notification Reason Change in status (c/o mid sternal CP - 6/10)  Response See new orders  Date of Provider Response 07/09/18  Time of Provider Response 0050   Upon reassessment, patient states that chest pain is gone.  She had "passed gas".  She is complaining of abdominal pain.  25 mcg Fentanyl given with excellent relief.  EKG done and is in chart.  Will continue to monitor patient.  VS are stable.  Earleen Reaper RN-BC, [employer]

## 2018-07-09 NOTE — Progress Notes (Addendum)
[facility] Progress Note   Subjective:  Seen in room. Cut her HD session short yesterday despite urging to get full HD. Denies CP or dyspnea today. Continues to move bowels and tolerate clear foods. Says that she will no longer eat exam gloves. Has been eval by surgery, GI, and psych -- no immediate plans.  Objective Vitals:   07/08/18 2109 07/09/18 0036 07/09/18 0218 07/09/18 0934  BP: (!) 159/90 (!) 171/91 (!) 148/76 (!) 148/85  Pulse: 75 85 82 84  Resp: 16 20 14 18   Temp: 98.4 F (36.9 C) 98.3 F (36.8 C) 98.4 F (36.9 C) 98.4 F (36.9 C)  TempSrc: Oral Oral Oral Oral  SpO2: 96% 96% 99% 98%  Weight: 88.3 kg      Physical Exam General: Well appearing woman, NAD Heart: RRR; no murmur Lungs: CTAB Abdomen: soft, non-tender Extremities: No LE edema Dialysis Access: L AVF + thrill  Additional Objective Labs: Basic Metabolic Panel: Recent Labs  Lab 07/07/18 2238 07/08/18 1241  NA 138 136  K 5.0 4.3  CL 94* 100  CO2 27 24  GLUCOSE 162* 156*  BUN 44* 37*  CREATININE 8.09* 6.45*  CALCIUM 10.5* 9.9   Liver Function Tests: Recent Labs  Lab 07/07/18 2238  AST 18  ALT 28  ALKPHOS 88  BILITOT 0.9  PROT 8.8*  ALBUMIN 3.7   CBC: Recent Labs  Lab 07/07/18 2238  WBC 8.1  NEUTROABS 5.6  HGB 11.4*  HCT 39.5  MCV 86.1  PLT 123*   Cardiac Enzymes: Recent Labs  Lab 07/09/18 0339 07/09/18 0952  TROPONINI 0.06* 0.05*   CBG: Recent Labs  Lab 07/08/18 0715 07/08/18 1119 07/08/18 1642 07/08/18 2107 07/09/18 0757  GLUCAP 226* 177* 178* 119* 123*   Studies/Results: Dg Abd Portable 1v-small Bowel Obstruction Protocol-initial, 8 Hr Delay  Result Date: 07/08/2018 CLINICAL DATA:  Small bowel obstruction. EXAM: PORTABLE ABDOMEN - 1 VIEW COMPARISON:  06/27/2018 and CT from 07/07/2018 FINDINGS: Portable supine image of the abdomen was obtained. Nasogastric tube is no longer identified. Again noted is a large amount of gastric contents. Gas-filled  loops of small bowel in the mid abdomen are again noted and small bowel loops measure up to 3.2 cm. The degree of small bowel distension is unchanged. Again noted is stool in the rectum. Limited evaluation for free air on this supine image. IMPRESSION: Bowel gas pattern is unchanged. There continues to be gas-filled loops of small bowel in the mid abdomen and suggestive for a small bowel obstruction. Nasogastric tube is no longer identified. Electronically Signed   By: Markus Daft M.D.   On: 07/08/2018 10:31   Dg Abd Portable 1v  Result Date: 07/07/2018 CLINICAL DATA:  NG tube placement.  Small bowel obstruction. EXAM: PORTABLE ABDOMEN - 1 VIEW COMPARISON:  CT earlier this day. FINDINGS: Tip and side port of the enteric tube below the diaphragm in the stomach. Stomach distended with heterogeneous contents. Mildly dilated small bowel in the central abdomen. No evidence of free air. IMPRESSION: 1. Tip and side port of the enteric tube below the diaphragm in the stomach. 2. Small bowel dilatation as seen on recent CT consistent with small bowel obstruction. Stomach distended with heterogeneous contents. Electronically Signed   By: Keith Rake M.D.   On: 07/07/2018 23:03   Medications:  . amLODipine  10 mg Oral Daily  . Chlorhexidine Gluconate Cloth  6 each Topical Q0600  . insulin aspart  0-9 Units Subcutaneous TID WC  .  metoprolol succinate  12.5 mg Oral Daily    Dialysis Orders: MWF at [facility] (last name [facility] in [facility] system) 4hr, 400/800, EDW 90kg, 2K/2.25Ca, AVG, UF profile 2, heparin 6000 + 2000 mid-run bolus - Venofer 100mg  x 5 (in process) - Mircera 148mcg IV q 2 weeks - Hectoral 62mcg Iv q HD  Assessment/Plan: 1. 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 Bowels seem to be moving at this time -- no surgical plans for now. No endoscopic plans or immediate psych  recommendations. Having diet advanced per primary team. 2. ESRD: Will continue HD per usual MWF schedule, next 10/21. 3. BP/volume: Under EDW -- low UF goal. Follow. 4. Anemia of ESRD: Hgb 11.4, no ESA for now. 5. Secondary HPTH: Ca high on admit, will hold off on Hectoral for now. 6. DM: On insulin, per primary. 7. Dispo: [facility] for discharge from renal standpoint once SBO resolved and tolerating PO. Will recommend outpatient behavioral therapy, as per psych note, and continue to discourage eating gloves.  Veneta Penton, PA-C 07/09/2018, 11:13 AM  [employer] Pager: [phone]  Pt seen, examined and agree w A/P as above.  Kelly Splinter MD [employer] pager [phone]   07/09/2018, 1:39 PM

## 2018-07-09 NOTE — Progress Notes (Addendum)
[facility] PROGRESS NOTE  Karen [name] BTD:[id number] DOB: [birth year] DOA: 07/07/2018  PCP: Lillard Anes, MD  Brief History/Interval Summary: 42 y.o. female with history of ESRD on hemodialysis on Monday Wednesday Friday, diabetes mellitus type 2, hypertension, anemia, status post lower extremity amputation, peripheral artery disease, ischemic cardiomyopathy, presented to the ER at [facility] with complaints of abdominal pain nausea vomiting.  Evaluation included a CT scan which showed findings suggestive of small bowel obstruction.  There was also significant debris noted in the stomach.  NG tube was placed.  Patient was transferred to [place] surgery was consulted.  Reason for Visit: Small bowel obstruction  Consultants: General surgery.  Gastroenterology.  Psychiatry.  Nephrology  Procedures: Hemodialysis  Antibiotics: None  Subjective/Interval History: Patient remains a poor historian.  However she denies any abdominal pain nausea or vomiting.  Continues to pass gas.  No bowel movement since yesterday.  Tolerating her diet.    ROS: Denies any shortness of breath  Objective:  Vital Signs  Vitals:   07/08/18 2109 07/09/18 0036 07/09/18 0218 07/09/18 0934  BP: (!) 159/90 (!) 171/91 (!) 148/76 (!) 148/85  Pulse: 75 85 82 84  Resp: 16 20 14 18   Temp: 98.4 F (36.9 C) 98.3 F (36.8 C) 98.4 F (36.9 C) 98.4 F (36.9 C)  TempSrc: Oral Oral Oral Oral  SpO2: 96% 96% 99% 98%  Weight: 88.3 kg       Intake/Output Summary (Last 24 hours) at 07/09/2018 0936 Last data filed at 07/09/2018 0700 Gross per 24 hour  Intake 860 ml  Output 1252 ml  Net -392 ml   Filed Weights   07/08/18 1200 07/08/18 1353 07/08/18 2109  Weight: 89.9 kg 88.3 kg 88.3 kg    General appearance: Awake alert.  In no distress Resp:.  Clear to auscultation bilaterally Cardio: S1-S2 is normal regular.  No S3-S4.  No rubs murmurs or bruit GI: Abdomen is  soft.  Nontender nondistended bowel sounds are present.  No masses organomegaly Extremities: Status post left lower extremity amputation Neurologic: Somewhat distracted.  No focal neurological deficits.  Lab Results:  Data Reviewed: I have personally reviewed following labs and imaging studies  CBC: Recent Labs  Lab 07/07/18 2238  WBC 8.1  NEUTROABS 5.6  HGB 11.4*  HCT 39.5  MCV 86.1  PLT 123*    Basic Metabolic Panel: Recent Labs  Lab 07/07/18 2238 07/08/18 1241  NA 138 136  K 5.0 4.3  CL 94* 100  CO2 27 24  GLUCOSE 162* 156*  BUN 44* 37*  CREATININE 8.09* 6.45*  CALCIUM 10.5* 9.9    GFR: Estimated Creatinine Clearance: 14.4 mL/min (A) (by C-G formula based on SCr of 6.45 mg/dL (H)).  Liver Function Tests: Recent Labs  Lab 07/07/18 2238  AST 18  ALT 28  ALKPHOS 88  BILITOT 0.9  PROT 8.8*  ALBUMIN 3.7    CBG: Recent Labs  Lab 07/08/18 0715 07/08/18 1119 07/08/18 1642 07/08/18 2107 07/09/18 0757  GLUCAP 226* 177* 178* 119* 123*     Recent Results (from the past 240 hour(s))  MRSA PCR Screening     Status: None   Collection Time: 07/07/18 10:01 PM  Result Value Ref Range Status   MRSA by PCR NEGATIVE NEGATIVE Final    Comment:        The GeneXpert MRSA Assay (FDA approved for NASAL specimens only), is one component of a comprehensive MRSA colonization surveillance program. It is  not intended to diagnose MRSA infection nor to guide or monitor treatment for MRSA infections. Performed at [facility], [place] [address], [place], [place] [address]       Radiology Studies: Dg Abd Portable 1v-small Bowel Obstruction Protocol-initial, 8 Hr Delay  Result Date: 07/08/2018 CLINICAL DATA:  Small bowel obstruction. EXAM: PORTABLE ABDOMEN - 1 VIEW COMPARISON:  06/27/2018 and CT from 07/07/2018 FINDINGS: Portable supine image of the abdomen was obtained. Nasogastric tube is no longer identified. Again noted is a large amount of gastric  contents. Gas-filled loops of small bowel in the mid abdomen are again noted and small bowel loops measure up to 3.2 cm. The degree of small bowel distension is unchanged. Again noted is stool in the rectum. Limited evaluation for free air on this supine image. IMPRESSION: Bowel gas pattern is unchanged. There continues to be gas-filled loops of small bowel in the mid abdomen and suggestive for a small bowel obstruction. Nasogastric tube is no longer identified. Electronically Signed   By: Markus Daft M.D.   On: 07/08/2018 10:31   Dg Abd Portable 1v  Result Date: 07/07/2018 CLINICAL DATA:  NG tube placement.  Small bowel obstruction. EXAM: PORTABLE ABDOMEN - 1 VIEW COMPARISON:  CT earlier this day. FINDINGS: Tip and side port of the enteric tube below the diaphragm in the stomach. Stomach distended with heterogeneous contents. Mildly dilated small bowel in the central abdomen. No evidence of free air. IMPRESSION: 1. Tip and side port of the enteric tube below the diaphragm in the stomach. 2. Small bowel dilatation as seen on recent CT consistent with small bowel obstruction. Stomach distended with heterogeneous contents. Electronically Signed   By: Keith Rake M.D.   On: 07/07/2018 23:03     Medications:  Scheduled: . amLODipine  10 mg Oral Daily  . Chlorhexidine Gluconate Cloth  6 each Topical Q0600  . insulin aspart  0-9 Units Subcutaneous TID WC  . metoprolol succinate  12.5 mg Oral Daily   Continuous:  TDV:[id number] **OR** acetaminophen, fentaNYL (SUBLIMAZE) injection, hydrALAZINE, nitroGLYCERIN, ondansetron **OR** ondansetron (ZOFRAN) IV  Assessment/Plan:    Small bowel obstruction Patient had multiple bowel movements yesterday.  NG tube was discontinued by general surgery.  She is tolerating her diet.  Passing gas.  Diet advanced by GI today.    Significant gastric distention with debris CT scan apparently showed a lot of debris in the stomach.  Patient apparently has a  habit of stealing hospital gloves from her dialysis center. She then cuts them into small pieces and eats them.  There is evidence for gastric bezoar.  Seen by gastroenterology.  Appreciate their input.  No clear role for endoscopy at this time.  They have advance her diet to dysphagia 2.  They recommend keeping the patient on this diet for now.  Seen by psychiatry as well.  Will need to be established with psychiatry in the outpatient setting.  If patient continues to do well over the next 24 hours she should be able to go home tomorrow.   Accelerated hypertension Blood pressure was noted to be quite high yesterday.  Her antihypertensive medication regimen was reinitiated.  Blood pressure better controlled today.  Patient had transient chest pain overnight which was relieved after she passed gas.  Chest pain was most likely GI in origin.  EKG with nonspecific changes.  Mildly abnormal troponin likely related to renal failure.  Completely asymptomatic this morning.  No further work-up at this time.  Diabetes mellitus  type 2 Continue to monitor CBGs.  SSI.  End-stage renal disease on hemodialysis on Monday Wednesday Friday Nephrology is following.  Chronic systolic and diastolic CHF Echocardiogram from December 2018 showed EF to be 35 to 40%.  Grade 3 diastolic dysfunction was noted.  Volume being managed with hemodialysis.  Patient is on beta-blockers.  Normocytic anemia Most likely due to chronic kidney disease.  No evidence of overt bleeding.    Mild thrombocytopenia Outpatient monitoring  DVT Prophylaxis: SCDs    Code Status: Full code Family Communication: Discussed with the patient.  No family at bedside Disposition Plan: Management as outlined above.  Anticipate discharge tomorrow depending on clinical improvement.    LOS: 2 days   [facility] Pager [phone] 07/09/2018, 9:36 AM  If 7PM-7AM, please contact night-coverage at [email or web address], password  [facility]

## 2018-07-10 LAB — GLUCOSE, CAPILLARY
Glucose-Capillary: 88 mg/dL (ref 70–99)
Glucose-Capillary: 151 mg/dL — ABNORMAL HIGH (ref 70–99)

## 2018-07-10 MED ORDER — METOPROLOL SUCCINATE ER 25 MG PO TB24: 12.5000 mg | ORAL_TABLET | Freq: Every day | ORAL | 0 refills | Status: DC

## 2018-07-10 NOTE — Discharge Summary (Signed)
[facility]  Physician Discharge Summary   Patient ID: Karen [name] MRN: [id number] DOB/AGE: 41/17/1977 41 y.o.  Admit date: 07/07/2018 Discharge date: 07/10/2018  PCP: Lillard Anes, MD  DISCHARGE DIAGNOSES:  Small bowel obstruction, resolved Gastric distention with debris Compulsive eating of surgical gloves End-stage renal disease on hemodialysis  RECOMMENDATIONS FOR OUTPATIENT FOLLOW UP: 1. She is instructed to resume her usual outpatient dialysis schedule starting tomorrow 2. She is been told to stop ingesting hospital gloves 3. She will benefit from outpatient psychiatry follow-up.  PCP to please arrange.   DISCHARGE CONDITION: fair  Diet recommendation: As before  [facility] Weights   07/08/18 1353 07/08/18 2109 07/09/18 2035  Weight: 88.3 kg 88.3 kg 92.8 kg    INITIAL HISTORY: 42 y.o.femalewithhistory of ESRD on hemodialysis on Monday Wednesday Friday, diabetes mellitus type 2, hypertension, anemia, status post lower extremity amputation, peripheral artery disease, ischemic cardiomyopathy, presented to the ER at [facility] with complaints of abdominal pain nausea vomiting.  Evaluation included a CT scan which showed findings suggestive of small bowel obstruction.  There was also significant debris noted in the stomach.  NG tube was placed.  Patient was transferred to [facility] surgery was consulted.  Consultants: General surgery.  Gastroenterology.  Psychiatry.  Nephrology  Procedures: Hemodialysis   HOSPITAL COURSE:   Small bowel obstruction NG tube was placed.  Patient was seen by general surgery.  Patient had multiple bowel movements.  NG tube was discontinued.  She was started on liquid diet which was advanced to dysphagia 2 diet.  Denies any nausea vomiting.  Having bowel movements.     Significant gastric distention with debris CT scan apparently showed a lot of debris in the stomach.  Patient apparently has a  habit of stealing hospital gloves from her dialysis center. She then cuts them into small pieces and eats them.  There is evidence for gastric bezoar.  Seen by gastroenterology.  No role for endoscopy at this time.  They advanced her diet to dysphagia 2.  They recommend keeping the patient on this diet for now.  Seen by psychiatry as well.  Will need to be established with psychiatry in the outpatient setting.  No role for medications at this time.  Essential hypertension Shortly she cannot be given her antihypertensives due to the small bowel obstruction.  This will resume.  Blood pressure remains poorly but she is asymptomatic.  Should improve gradually.  Patient had transient chest pain on the night of 10/18 which was relieved after she passed gas.  Chest pain was most likely GI in origin.  EKG with nonspecific changes.  Mildly abnormal troponin likely related to renal failure.    No further recurrence of pain.  Diabetes mellitus type 2 Continue home medications as before.  End-stage renal disease on hemodialysis on Monday Wednesday Friday Seen by nephrology.  Underwent dialysis.  May resume usual outpatient dialysis schedule.  Chronic systolic and diastolic CHF Echocardiogram from December 2018 showed EF to be 35 to 40%.  Grade 3 diastolic dysfunction was noted.  Volume being managed with hemodialysis.  Patient is on beta-blockers.  Normocytic anemia Most likely due to chronic kidney disease.  No evidence of overt bleeding.    Mild thrombocytopenia Outpatient monitoring.  Overall stable.  Okay for discharge today.   PERTINENT LABS:  The results of significant diagnostics from this hospitalization (including imaging, microbiology, ancillary and laboratory) are listed below for reference.    Microbiology: Recent Results (from  the past 240 hour(s))  MRSA PCR Screening     Status: None   Collection Time: 07/07/18 10:01 PM  Result Value Ref Range Status   MRSA by PCR NEGATIVE  NEGATIVE Final    Comment:        The GeneXpert MRSA Assay (FDA approved for NASAL specimens only), is one component of a comprehensive MRSA colonization surveillance program. It is not intended to diagnose MRSA infection nor to guide or monitor treatment for MRSA infections. Performed at [facility], [place] [address] [place], [place] [address]      Labs: Basic Metabolic Panel: Recent Labs  Lab 07/07/18 2238 07/08/18 1241  NA 138 136  K 5.0 4.3  CL 94* 100  CO2 27 24  GLUCOSE 162* 156*  BUN 44* 37*  CREATININE 8.09* 6.45*  CALCIUM 10.5* 9.9   Liver Function Tests: Recent Labs  Lab 07/07/18 2238  AST 18  ALT 28  ALKPHOS 88  BILITOT 0.9  PROT 8.8*  ALBUMIN 3.7   CBC: Recent Labs  Lab 07/07/18 2238  WBC 8.1  NEUTROABS 5.6  HGB 11.4*  HCT 39.5  MCV 86.1  PLT 123*   Cardiac Enzymes: Recent Labs  Lab 07/09/18 0339 07/09/18 0952  TROPONINI 0.06* 0.05*    CBG: Recent Labs  Lab 07/09/18 1153 07/09/18 1632 07/09/18 2130 07/10/18 0746 07/10/18 1152  GLUCAP 173* 122* 173* 88 151*     IMAGING STUDIES Dg Abd Portable 1v-small Bowel Obstruction Protocol-initial, 8 Hr Delay  Result Date: 07/08/2018 CLINICAL DATA:  Small bowel obstruction. EXAM: PORTABLE ABDOMEN - 1 VIEW COMPARISON:  06/27/2018 and CT from 07/07/2018 FINDINGS: Portable supine image of the abdomen was obtained. Nasogastric tube is no longer identified. Again noted is a large amount of gastric contents. Gas-filled loops of small bowel in the mid abdomen are again noted and small bowel loops measure up to 3.2 cm. The degree of small bowel distension is unchanged. Again noted is stool in the rectum. Limited evaluation for free air on this supine image. IMPRESSION: Bowel gas pattern is unchanged. There continues to be gas-filled loops of small bowel in the mid abdomen and suggestive for a small bowel obstruction. Nasogastric tube is no longer identified. Electronically Signed   By: Markus Daft M.D.   On: 07/08/2018 10:31   Dg Abd Portable 1v  Result Date: 07/07/2018 CLINICAL DATA:  NG tube placement.  Small bowel obstruction. EXAM: PORTABLE ABDOMEN - 1 VIEW COMPARISON:  CT earlier this day. FINDINGS: Tip and side port of the enteric tube below the diaphragm in the stomach. Stomach distended with heterogeneous contents. Mildly dilated small bowel in the central abdomen. No evidence of free air. IMPRESSION: 1. Tip and side port of the enteric tube below the diaphragm in the stomach. 2. Small bowel dilatation as seen on recent CT consistent with small bowel obstruction. Stomach distended with heterogeneous contents. Electronically Signed   By: Keith Rake M.D.   On: 07/07/2018 23:03    DISCHARGE EXAMINATION: Vitals:   07/09/18 1957 07/09/18 2035 07/10/18 0422 07/10/18 0933  BP: (!) 165/93  (!) 143/89 (!) 183/95  Pulse: 80  79 86  Resp: 18  18 20   Temp: 98.6 F (37 C)  98.4 F (36.9 C) 98.6 F (37 C)  TempSrc: Oral  Oral Oral  SpO2: 95%  98% 98%  Weight:  92.8 kg     General appearance: alert, cooperative, appears stated age and no distress Resp: clear to auscultation bilaterally  Cardio: regular rate and rhythm, S1, S2 normal, no murmur, click, rub or gallop GI: soft, non-tender; bowel sounds normal; no masses,  no organomegaly  DISPOSITION: home   Discharge Instructions    Call MD for:  extreme fatigue   Complete by:  As directed    Call MD for:  persistant dizziness or light-headedness   Complete by:  As directed    Call MD for:  persistant nausea and vomiting   Complete by:  As directed    Call MD for:  redness, tenderness, or signs of infection (pain, swelling, redness, odor or green/yellow discharge around incision site)   Complete by:  As directed    Call MD for:  severe uncontrolled pain   Complete by:  As directed    Call MD for:  temperature >100.4   Complete by:  As directed    Discharge instructions   Complete by:  As directed    Please go for  dialysis tomorrow as per usual schedule. Please only eat a soft diet.  You were cared for by a hospitalist during your hospital stay. If you have any questions about your discharge medications or the care you received while you were in the hospital after you are discharged, you can call the unit and asked to speak with the hospitalist on call if the hospitalist that took care of you is not available. Once you are discharged, your primary care physician will handle any further medical issues. Please note that NO REFILLS for any discharge medications will be authorized once you are discharged, as it is imperative that you return to your primary care physician (or establish a relationship with a primary care physician if you do not have one) for your aftercare needs so that they can reassess your need for medications and monitor your lab values. If you do not have a primary care physician, you can call [phone] for a physician referral.   Increase activity slowly   Complete by:  As directed         Allergies as of 07/10/2018      Reactions   Baclofen Itching   Penicillins Anaphylaxis, Hives, Rash, Other (See Comments)   PATIENT HAD A PCN REACTION WITH IMMEDIATE RASH, FACIAL/TONGUE/THROAT SWELLING, SOB, OR LIGHTHEADEDNESS WITH HYPOTENSION:  #  #  #  YES  #  #  #   SEVERE RASH INVOLVING MUCUS MEMBRANES or SKIN NECROSIS: #  #  #  YES  #  #  # Has patient had a PCN reaction that required hospitalization No Has patient had a PCN reaction occurring within the last 10 years: No If all of the above answers are "NO", then may proceed with Cephalosporin use. 09/10/16- tolerated Cefepime   Morphine And Related Hives, Rash   Novolog [insulin Aspart] Other (See Comments)   Cramps/ Gi distress      Medication List    STOP taking these medications   calcium acetate 667 MG capsule Commonly known as:  PHOSLO   insulin regular 100 units/mL injection Commonly known as:  NOVOLIN R,HUMULIN R     TAKE  these medications   amLODipine 10 MG tablet Commonly known as:  NORVASC Take 10 mg by mouth daily.   aspirin 325 MG tablet Take 1 tablet (325 mg total) by mouth daily.   atorvastatin 80 MG tablet Commonly known as:  LIPITOR TAKE 1 TABLET(80 MG) BY MOUTH DAILY AT 6 PM   calcitRIOL 0.5 MCG capsule Commonly known as:  ROCALTROL  Take 3 capsules (1.5 mcg total) by mouth Every Tuesday,Thursday,and Saturday with dialysis.   cinacalcet 30 MG tablet Commonly known as:  SENSIPAR Take 1 tablet (30 mg total) by mouth Every Tuesday,Thursday,and Saturday with dialysis.   Darbepoetin Alfa 200 MCG/0.4ML Sosy injection Commonly known as:  ARANESP Inject 0.4 mLs (200 mcg total) into the vein every Thursday with hemodialysis.   ferric gluconate 62.5 mg in sodium chloride 0.9 % 100 mL Inject 62.5 mg into the vein every Thursday with hemodialysis.   LANTUS 100 UNIT/ML injection Generic drug:  insulin glargine Inject 18 Units into the skin at bedtime.   metoprolol succinate 25 MG 24 hr tablet Commonly known as:  TOPROL-XL Take 0.5 tablets (12.5 mg total) by mouth daily.   multivitamin Tabs tablet Take 1 tablet by mouth at bedtime.   ondansetron 4 MG tablet Commonly known as:  ZOFRAN Take 4 mg by mouth daily as needed for nausea or vomiting.   Oxycodone HCl 10 MG Tabs Take 10 mg by mouth every 8 (eight) hours as needed (Pain).   polyethylene glycol packet Commonly known as:  MIRALAX / GLYCOLAX Take 17 g by mouth daily.   sevelamer carbonate 800 MG tablet Commonly known as:  RENVELA Take 800 mg by mouth 3 (three) times daily with meals.        Follow-up Information    Lillard Anes, MD Follow up.   Specialty:  Family Medicine Why:  for referral to outpatient psychiatry Contact information: [address] [place] [address] [phone]           TOTAL DISCHARGE TIME: 27 mins  Bonnielee Haff  [facility] Pager [phone]  07/10/2018,  1:19 PM

## 2018-07-10 NOTE — Progress Notes (Addendum)
  [facility] Progress Note   Subjective:   Seen in room. No CP/dyspnea. Eating solid food this morning without issues. Still passing BMs without issues. Plan is possibly for discharge this afternoon.  Objective Vitals:   07/09/18 1957 07/09/18 2035 07/10/18 0422 07/10/18 0933  BP: (!) 165/93  (!) 143/89 (!) 183/95  Pulse: 80  79 86  Resp: 18  18 20   Temp: 98.6 F (37 C)  98.4 F (36.9 C) 98.6 F (37 C)  TempSrc: Oral  Oral Oral  SpO2: 95%  98% 98%  Weight:  92.8 kg     Physical Exam General: Well appearing woman, NAD Heart: RRR; no murmur Lungs: CTAB Abdomen: soft, non-tender Extremities: No LE edema Dialysis Access: L AVF + thrill   Additional Objective Labs: Basic Metabolic Panel: Recent Labs  Lab 07/07/18 2238 07/08/18 1241  NA 138 136  K 5.0 4.3  CL 94* 100  CO2 27 24  GLUCOSE 162* 156*  BUN 44* 37*  CREATININE 8.09* 6.45*  CALCIUM 10.5* 9.9   Liver Function Tests: Recent Labs  Lab 07/07/18 2238  AST 18  ALT 28  ALKPHOS 88  BILITOT 0.9  PROT 8.8*  ALBUMIN 3.7   CBC: Recent Labs  Lab 07/07/18 2238  WBC 8.1  NEUTROABS 5.6  HGB 11.4*  HCT 39.5  MCV 86.1  PLT 123*   Cardiac Enzymes: Recent Labs  Lab 07/09/18 0339 07/09/18 0952  TROPONINI 0.06* 0.05*   CBG: Recent Labs  Lab 07/09/18 0757 07/09/18 1153 07/09/18 1632 07/09/18 2130 07/10/18 0746  GLUCAP 123* 173* 122* 173* 88   Medications:  . amLODipine  10 mg Oral Daily  . Chlorhexidine Gluconate Cloth  6 each Topical Q0600  . insulin aspart  0-9 Units Subcutaneous TID WC  . metoprolol succinate  12.5 mg Oral Daily    Dialysis Orders: MWF at [facility] (last name [facility] in [facility] system) 4hr, 400/800, EDW 90kg, 2K/2.25Ca, AVG, UF profile 2, heparin 6000 + 2000 mid-run bolus - Venofer 100mg  x 5 (in process) - Mircera 163mcg IV q 2 weeks - Hectoral 67mcg Iv q HD  Assessment/Plan: 1. 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 Bowels still moving at this time and now tolerating solid foods. No surgical/endoscopic plans or immediate psych recommendations.  2. ESRD: Will continue HD per usual MWF schedule, next due 10/21 (likely as outpatient). 3. BP/volume: BP high today, weight back to baseline. 4. Anemia of ESRD: Hgb 11.4, no ESA for now. 5. Secondary HPTH: Ca high on admit, will hold off on Hectoral for now. 6. DM: On insulin, per primary. 7. Dispo: [place] for discharge from renal standpoint once SBO resolved and tolerating PO. Will recommend outpatient behavioral therapy, as per psych note, and continue to discourage eating gloves.  Veneta Penton, PA-C 07/10/2018, 10:46 AM  [facility] Pager: [phone]  Pt seen, examined and agree w A/P as above.  Kelly Splinter MD [employer] pager [phone]   07/10/2018, 1:26 PM

## 2018-07-11 DIAGNOSIS — N2581 Secondary hyperparathyroidism of renal origin: Secondary | ICD-10-CM | POA: Diagnosis not present

## 2018-07-11 DIAGNOSIS — E875 Hyperkalemia: Secondary | ICD-10-CM | POA: Diagnosis not present

## 2018-07-11 DIAGNOSIS — N186 End stage renal disease: Secondary | ICD-10-CM | POA: Diagnosis not present

## 2018-07-11 DIAGNOSIS — E1129 Type 2 diabetes mellitus with other diabetic kidney complication: Secondary | ICD-10-CM | POA: Diagnosis not present

## 2018-07-11 DIAGNOSIS — D509 Iron deficiency anemia, unspecified: Secondary | ICD-10-CM | POA: Diagnosis not present

## 2018-07-11 DIAGNOSIS — D631 Anemia in chronic kidney disease: Secondary | ICD-10-CM | POA: Diagnosis not present

## 2018-07-12 DIAGNOSIS — E1022 Type 1 diabetes mellitus with diabetic chronic kidney disease: Secondary | ICD-10-CM | POA: Diagnosis not present

## 2018-07-12 DIAGNOSIS — I120 Hypertensive chronic kidney disease with stage 5 chronic kidney disease or end stage renal disease: Secondary | ICD-10-CM | POA: Diagnosis not present

## 2018-07-12 DIAGNOSIS — R2689 Other abnormalities of gait and mobility: Secondary | ICD-10-CM | POA: Diagnosis not present

## 2018-07-12 DIAGNOSIS — N186 End stage renal disease: Secondary | ICD-10-CM | POA: Diagnosis not present

## 2018-07-12 DIAGNOSIS — I69321 Dysphasia following cerebral infarction: Secondary | ICD-10-CM | POA: Diagnosis not present

## 2018-07-12 DIAGNOSIS — I69398 Other sequelae of cerebral infarction: Secondary | ICD-10-CM | POA: Diagnosis not present

## 2018-07-13 DIAGNOSIS — N2581 Secondary hyperparathyroidism of renal origin: Secondary | ICD-10-CM | POA: Diagnosis not present

## 2018-07-13 DIAGNOSIS — E1129 Type 2 diabetes mellitus with other diabetic kidney complication: Secondary | ICD-10-CM | POA: Diagnosis not present

## 2018-07-13 DIAGNOSIS — N186 End stage renal disease: Secondary | ICD-10-CM | POA: Diagnosis not present

## 2018-07-13 DIAGNOSIS — D509 Iron deficiency anemia, unspecified: Secondary | ICD-10-CM | POA: Diagnosis not present

## 2018-07-13 DIAGNOSIS — E875 Hyperkalemia: Secondary | ICD-10-CM | POA: Diagnosis not present

## 2018-07-13 DIAGNOSIS — D631 Anemia in chronic kidney disease: Secondary | ICD-10-CM | POA: Diagnosis not present

## 2018-07-14 DIAGNOSIS — I69321 Dysphasia following cerebral infarction: Secondary | ICD-10-CM | POA: Diagnosis not present

## 2018-07-14 DIAGNOSIS — I69398 Other sequelae of cerebral infarction: Secondary | ICD-10-CM | POA: Diagnosis not present

## 2018-07-14 DIAGNOSIS — R2689 Other abnormalities of gait and mobility: Secondary | ICD-10-CM | POA: Diagnosis not present

## 2018-07-14 DIAGNOSIS — I120 Hypertensive chronic kidney disease with stage 5 chronic kidney disease or end stage renal disease: Secondary | ICD-10-CM | POA: Diagnosis not present

## 2018-07-14 DIAGNOSIS — N186 End stage renal disease: Secondary | ICD-10-CM | POA: Diagnosis not present

## 2018-07-14 DIAGNOSIS — E1022 Type 1 diabetes mellitus with diabetic chronic kidney disease: Secondary | ICD-10-CM | POA: Diagnosis not present

## 2018-07-18 DIAGNOSIS — R2689 Other abnormalities of gait and mobility: Secondary | ICD-10-CM | POA: Diagnosis not present

## 2018-07-18 DIAGNOSIS — N2581 Secondary hyperparathyroidism of renal origin: Secondary | ICD-10-CM | POA: Diagnosis not present

## 2018-07-18 DIAGNOSIS — M6281 Muscle weakness (generalized): Secondary | ICD-10-CM | POA: Diagnosis not present

## 2018-07-18 DIAGNOSIS — E875 Hyperkalemia: Secondary | ICD-10-CM | POA: Diagnosis not present

## 2018-07-18 DIAGNOSIS — D509 Iron deficiency anemia, unspecified: Secondary | ICD-10-CM | POA: Diagnosis not present

## 2018-07-18 DIAGNOSIS — E1022 Type 1 diabetes mellitus with diabetic chronic kidney disease: Secondary | ICD-10-CM | POA: Diagnosis not present

## 2018-07-18 DIAGNOSIS — N186 End stage renal disease: Secondary | ICD-10-CM | POA: Diagnosis not present

## 2018-07-18 DIAGNOSIS — D631 Anemia in chronic kidney disease: Secondary | ICD-10-CM | POA: Diagnosis not present

## 2018-07-18 DIAGNOSIS — I69398 Other sequelae of cerebral infarction: Secondary | ICD-10-CM | POA: Diagnosis not present

## 2018-07-18 DIAGNOSIS — E1129 Type 2 diabetes mellitus with other diabetic kidney complication: Secondary | ICD-10-CM | POA: Diagnosis not present

## 2018-07-18 DIAGNOSIS — I120 Hypertensive chronic kidney disease with stage 5 chronic kidney disease or end stage renal disease: Secondary | ICD-10-CM | POA: Diagnosis not present

## 2018-07-19 DIAGNOSIS — R2689 Other abnormalities of gait and mobility: Secondary | ICD-10-CM | POA: Diagnosis not present

## 2018-07-19 DIAGNOSIS — N186 End stage renal disease: Secondary | ICD-10-CM | POA: Diagnosis not present

## 2018-07-19 DIAGNOSIS — I120 Hypertensive chronic kidney disease with stage 5 chronic kidney disease or end stage renal disease: Secondary | ICD-10-CM | POA: Diagnosis not present

## 2018-07-19 DIAGNOSIS — I69398 Other sequelae of cerebral infarction: Secondary | ICD-10-CM | POA: Diagnosis not present

## 2018-07-19 DIAGNOSIS — E1022 Type 1 diabetes mellitus with diabetic chronic kidney disease: Secondary | ICD-10-CM | POA: Diagnosis not present

## 2018-07-19 DIAGNOSIS — M6281 Muscle weakness (generalized): Secondary | ICD-10-CM | POA: Diagnosis not present

## 2018-07-20 DIAGNOSIS — D631 Anemia in chronic kidney disease: Secondary | ICD-10-CM | POA: Diagnosis not present

## 2018-07-20 DIAGNOSIS — N2581 Secondary hyperparathyroidism of renal origin: Secondary | ICD-10-CM | POA: Diagnosis not present

## 2018-07-20 DIAGNOSIS — E875 Hyperkalemia: Secondary | ICD-10-CM | POA: Diagnosis not present

## 2018-07-20 DIAGNOSIS — N186 End stage renal disease: Secondary | ICD-10-CM | POA: Diagnosis not present

## 2018-07-20 DIAGNOSIS — E1129 Type 2 diabetes mellitus with other diabetic kidney complication: Secondary | ICD-10-CM | POA: Diagnosis not present

## 2018-07-20 DIAGNOSIS — D509 Iron deficiency anemia, unspecified: Secondary | ICD-10-CM | POA: Diagnosis not present

## 2018-07-21 DIAGNOSIS — B351 Tinea unguium: Secondary | ICD-10-CM | POA: Diagnosis not present

## 2018-07-21 DIAGNOSIS — M47816 Spondylosis without myelopathy or radiculopathy, lumbar region: Secondary | ICD-10-CM | POA: Diagnosis not present

## 2018-07-21 DIAGNOSIS — K56609 Unspecified intestinal obstruction, unspecified as to partial versus complete obstruction: Secondary | ICD-10-CM | POA: Diagnosis not present

## 2018-07-22 DIAGNOSIS — D631 Anemia in chronic kidney disease: Secondary | ICD-10-CM | POA: Diagnosis not present

## 2018-07-22 DIAGNOSIS — Z992 Dependence on renal dialysis: Secondary | ICD-10-CM | POA: Diagnosis not present

## 2018-07-22 DIAGNOSIS — N2581 Secondary hyperparathyroidism of renal origin: Secondary | ICD-10-CM | POA: Diagnosis not present

## 2018-07-22 DIAGNOSIS — E1122 Type 2 diabetes mellitus with diabetic chronic kidney disease: Secondary | ICD-10-CM | POA: Diagnosis not present

## 2018-07-22 DIAGNOSIS — N186 End stage renal disease: Secondary | ICD-10-CM | POA: Diagnosis not present

## 2018-07-27 DIAGNOSIS — D631 Anemia in chronic kidney disease: Secondary | ICD-10-CM | POA: Diagnosis not present

## 2018-07-27 DIAGNOSIS — N186 End stage renal disease: Secondary | ICD-10-CM | POA: Diagnosis not present

## 2018-07-27 DIAGNOSIS — N2581 Secondary hyperparathyroidism of renal origin: Secondary | ICD-10-CM | POA: Diagnosis not present

## 2018-07-29 DIAGNOSIS — N186 End stage renal disease: Secondary | ICD-10-CM | POA: Diagnosis not present

## 2018-07-29 DIAGNOSIS — N2581 Secondary hyperparathyroidism of renal origin: Secondary | ICD-10-CM | POA: Diagnosis not present

## 2018-07-29 DIAGNOSIS — D631 Anemia in chronic kidney disease: Secondary | ICD-10-CM | POA: Diagnosis not present

## 2018-08-01 DIAGNOSIS — D631 Anemia in chronic kidney disease: Secondary | ICD-10-CM | POA: Diagnosis not present

## 2018-08-01 DIAGNOSIS — N2581 Secondary hyperparathyroidism of renal origin: Secondary | ICD-10-CM | POA: Diagnosis not present

## 2018-08-01 DIAGNOSIS — N186 End stage renal disease: Secondary | ICD-10-CM | POA: Diagnosis not present

## 2018-08-02 DIAGNOSIS — M6281 Muscle weakness (generalized): Secondary | ICD-10-CM | POA: Diagnosis not present

## 2018-08-02 DIAGNOSIS — E1022 Type 1 diabetes mellitus with diabetic chronic kidney disease: Secondary | ICD-10-CM | POA: Diagnosis not present

## 2018-08-02 DIAGNOSIS — I69398 Other sequelae of cerebral infarction: Secondary | ICD-10-CM | POA: Diagnosis not present

## 2018-08-02 DIAGNOSIS — R2689 Other abnormalities of gait and mobility: Secondary | ICD-10-CM | POA: Diagnosis not present

## 2018-08-02 DIAGNOSIS — I120 Hypertensive chronic kidney disease with stage 5 chronic kidney disease or end stage renal disease: Secondary | ICD-10-CM | POA: Diagnosis not present

## 2018-08-02 DIAGNOSIS — N186 End stage renal disease: Secondary | ICD-10-CM | POA: Diagnosis not present

## 2018-08-06 DIAGNOSIS — D631 Anemia in chronic kidney disease: Secondary | ICD-10-CM | POA: Diagnosis not present

## 2018-08-06 DIAGNOSIS — N186 End stage renal disease: Secondary | ICD-10-CM | POA: Diagnosis not present

## 2018-08-06 DIAGNOSIS — N2581 Secondary hyperparathyroidism of renal origin: Secondary | ICD-10-CM | POA: Diagnosis not present

## 2018-08-09 DIAGNOSIS — M6281 Muscle weakness (generalized): Secondary | ICD-10-CM | POA: Diagnosis not present

## 2018-08-09 DIAGNOSIS — N186 End stage renal disease: Secondary | ICD-10-CM | POA: Diagnosis not present

## 2018-08-09 DIAGNOSIS — I120 Hypertensive chronic kidney disease with stage 5 chronic kidney disease or end stage renal disease: Secondary | ICD-10-CM | POA: Diagnosis not present

## 2018-08-09 DIAGNOSIS — I69398 Other sequelae of cerebral infarction: Secondary | ICD-10-CM | POA: Diagnosis not present

## 2018-08-09 DIAGNOSIS — R2689 Other abnormalities of gait and mobility: Secondary | ICD-10-CM | POA: Diagnosis not present

## 2018-08-09 DIAGNOSIS — E1022 Type 1 diabetes mellitus with diabetic chronic kidney disease: Secondary | ICD-10-CM | POA: Diagnosis not present

## 2018-08-10 DIAGNOSIS — N2581 Secondary hyperparathyroidism of renal origin: Secondary | ICD-10-CM | POA: Diagnosis not present

## 2018-08-10 DIAGNOSIS — N186 End stage renal disease: Secondary | ICD-10-CM | POA: Diagnosis not present

## 2018-08-10 DIAGNOSIS — D631 Anemia in chronic kidney disease: Secondary | ICD-10-CM | POA: Diagnosis not present

## 2018-08-15 DIAGNOSIS — N186 End stage renal disease: Secondary | ICD-10-CM | POA: Diagnosis not present

## 2018-08-15 DIAGNOSIS — N2581 Secondary hyperparathyroidism of renal origin: Secondary | ICD-10-CM | POA: Diagnosis not present

## 2018-08-15 DIAGNOSIS — D631 Anemia in chronic kidney disease: Secondary | ICD-10-CM | POA: Diagnosis not present

## 2018-08-17 DIAGNOSIS — N2581 Secondary hyperparathyroidism of renal origin: Secondary | ICD-10-CM | POA: Diagnosis not present

## 2018-08-17 DIAGNOSIS — D631 Anemia in chronic kidney disease: Secondary | ICD-10-CM | POA: Diagnosis not present

## 2018-08-17 DIAGNOSIS — N186 End stage renal disease: Secondary | ICD-10-CM | POA: Diagnosis not present

## 2018-08-21 DIAGNOSIS — E1122 Type 2 diabetes mellitus with diabetic chronic kidney disease: Secondary | ICD-10-CM | POA: Diagnosis not present

## 2018-08-21 DIAGNOSIS — N186 End stage renal disease: Secondary | ICD-10-CM | POA: Diagnosis not present

## 2018-08-21 DIAGNOSIS — Z992 Dependence on renal dialysis: Secondary | ICD-10-CM | POA: Diagnosis not present

## 2018-08-22 DIAGNOSIS — E1129 Type 2 diabetes mellitus with other diabetic kidney complication: Secondary | ICD-10-CM | POA: Diagnosis not present

## 2018-08-22 DIAGNOSIS — N2581 Secondary hyperparathyroidism of renal origin: Secondary | ICD-10-CM | POA: Diagnosis not present

## 2018-08-22 DIAGNOSIS — D631 Anemia in chronic kidney disease: Secondary | ICD-10-CM | POA: Diagnosis not present

## 2018-08-22 DIAGNOSIS — N186 End stage renal disease: Secondary | ICD-10-CM | POA: Diagnosis not present

## 2018-08-24 DIAGNOSIS — D631 Anemia in chronic kidney disease: Secondary | ICD-10-CM | POA: Diagnosis not present

## 2018-08-24 DIAGNOSIS — E1129 Type 2 diabetes mellitus with other diabetic kidney complication: Secondary | ICD-10-CM | POA: Diagnosis not present

## 2018-08-24 DIAGNOSIS — N2581 Secondary hyperparathyroidism of renal origin: Secondary | ICD-10-CM | POA: Diagnosis not present

## 2018-08-24 DIAGNOSIS — N186 End stage renal disease: Secondary | ICD-10-CM | POA: Diagnosis not present

## 2018-08-29 DIAGNOSIS — N2581 Secondary hyperparathyroidism of renal origin: Secondary | ICD-10-CM | POA: Diagnosis not present

## 2018-08-29 DIAGNOSIS — E1129 Type 2 diabetes mellitus with other diabetic kidney complication: Secondary | ICD-10-CM | POA: Diagnosis not present

## 2018-08-29 DIAGNOSIS — N186 End stage renal disease: Secondary | ICD-10-CM | POA: Diagnosis not present

## 2018-08-29 DIAGNOSIS — D631 Anemia in chronic kidney disease: Secondary | ICD-10-CM | POA: Diagnosis not present

## 2018-09-02 DIAGNOSIS — N2581 Secondary hyperparathyroidism of renal origin: Secondary | ICD-10-CM | POA: Diagnosis not present

## 2018-09-02 DIAGNOSIS — E1129 Type 2 diabetes mellitus with other diabetic kidney complication: Secondary | ICD-10-CM | POA: Diagnosis not present

## 2018-09-02 DIAGNOSIS — D631 Anemia in chronic kidney disease: Secondary | ICD-10-CM | POA: Diagnosis not present

## 2018-09-02 DIAGNOSIS — N186 End stage renal disease: Secondary | ICD-10-CM | POA: Diagnosis not present

## 2018-09-05 DIAGNOSIS — N2581 Secondary hyperparathyroidism of renal origin: Secondary | ICD-10-CM | POA: Diagnosis not present

## 2018-09-05 DIAGNOSIS — N186 End stage renal disease: Secondary | ICD-10-CM | POA: Diagnosis not present

## 2018-09-05 DIAGNOSIS — D631 Anemia in chronic kidney disease: Secondary | ICD-10-CM | POA: Diagnosis not present

## 2018-09-05 DIAGNOSIS — E1129 Type 2 diabetes mellitus with other diabetic kidney complication: Secondary | ICD-10-CM | POA: Diagnosis not present

## 2018-09-07 DIAGNOSIS — N2581 Secondary hyperparathyroidism of renal origin: Secondary | ICD-10-CM | POA: Diagnosis not present

## 2018-09-07 DIAGNOSIS — E1129 Type 2 diabetes mellitus with other diabetic kidney complication: Secondary | ICD-10-CM | POA: Diagnosis not present

## 2018-09-07 DIAGNOSIS — D631 Anemia in chronic kidney disease: Secondary | ICD-10-CM | POA: Diagnosis not present

## 2018-09-07 DIAGNOSIS — N186 End stage renal disease: Secondary | ICD-10-CM | POA: Diagnosis not present

## 2018-09-09 DIAGNOSIS — E1129 Type 2 diabetes mellitus with other diabetic kidney complication: Secondary | ICD-10-CM | POA: Diagnosis not present

## 2018-09-09 DIAGNOSIS — N186 End stage renal disease: Secondary | ICD-10-CM | POA: Diagnosis not present

## 2018-09-09 DIAGNOSIS — N2581 Secondary hyperparathyroidism of renal origin: Secondary | ICD-10-CM | POA: Diagnosis not present

## 2018-09-09 DIAGNOSIS — D631 Anemia in chronic kidney disease: Secondary | ICD-10-CM | POA: Diagnosis not present

## 2018-09-11 ENCOUNTER — Inpatient Hospital Stay (HOSPITAL_COMMUNITY)
Admission: AD | Admit: 2018-09-11 | Payer: Self-pay | Source: Other Acute Inpatient Hospital | Admitting: Internal Medicine

## 2018-09-13 DIAGNOSIS — N2581 Secondary hyperparathyroidism of renal origin: Secondary | ICD-10-CM | POA: Diagnosis not present

## 2018-09-13 DIAGNOSIS — D631 Anemia in chronic kidney disease: Secondary | ICD-10-CM | POA: Diagnosis not present

## 2018-09-13 DIAGNOSIS — E1129 Type 2 diabetes mellitus with other diabetic kidney complication: Secondary | ICD-10-CM | POA: Diagnosis not present

## 2018-09-13 DIAGNOSIS — N186 End stage renal disease: Secondary | ICD-10-CM | POA: Diagnosis not present

## 2018-09-18 DIAGNOSIS — N186 End stage renal disease: Secondary | ICD-10-CM | POA: Diagnosis not present

## 2018-09-18 DIAGNOSIS — D631 Anemia in chronic kidney disease: Secondary | ICD-10-CM | POA: Diagnosis not present

## 2018-09-18 DIAGNOSIS — E1129 Type 2 diabetes mellitus with other diabetic kidney complication: Secondary | ICD-10-CM | POA: Diagnosis not present

## 2018-09-18 DIAGNOSIS — N2581 Secondary hyperparathyroidism of renal origin: Secondary | ICD-10-CM | POA: Diagnosis not present

## 2018-09-20 DIAGNOSIS — N2581 Secondary hyperparathyroidism of renal origin: Secondary | ICD-10-CM | POA: Diagnosis not present

## 2018-09-20 DIAGNOSIS — N186 End stage renal disease: Secondary | ICD-10-CM | POA: Diagnosis not present

## 2018-09-20 DIAGNOSIS — D631 Anemia in chronic kidney disease: Secondary | ICD-10-CM | POA: Diagnosis not present

## 2018-09-20 DIAGNOSIS — E1129 Type 2 diabetes mellitus with other diabetic kidney complication: Secondary | ICD-10-CM | POA: Diagnosis not present

## 2018-09-21 DIAGNOSIS — N186 End stage renal disease: Secondary | ICD-10-CM | POA: Diagnosis not present

## 2018-09-21 DIAGNOSIS — Z992 Dependence on renal dialysis: Secondary | ICD-10-CM | POA: Diagnosis not present

## 2018-09-21 DIAGNOSIS — E1122 Type 2 diabetes mellitus with diabetic chronic kidney disease: Secondary | ICD-10-CM | POA: Diagnosis not present

## 2018-09-23 DIAGNOSIS — D509 Iron deficiency anemia, unspecified: Secondary | ICD-10-CM | POA: Diagnosis not present

## 2018-09-23 DIAGNOSIS — D631 Anemia in chronic kidney disease: Secondary | ICD-10-CM | POA: Diagnosis not present

## 2018-09-23 DIAGNOSIS — E1129 Type 2 diabetes mellitus with other diabetic kidney complication: Secondary | ICD-10-CM | POA: Diagnosis not present

## 2018-09-23 DIAGNOSIS — E8770 Fluid overload, unspecified: Secondary | ICD-10-CM | POA: Diagnosis not present

## 2018-09-23 DIAGNOSIS — N2581 Secondary hyperparathyroidism of renal origin: Secondary | ICD-10-CM | POA: Diagnosis not present

## 2018-09-23 DIAGNOSIS — N186 End stage renal disease: Secondary | ICD-10-CM | POA: Diagnosis not present

## 2018-09-24 DIAGNOSIS — E1129 Type 2 diabetes mellitus with other diabetic kidney complication: Secondary | ICD-10-CM | POA: Diagnosis not present

## 2018-09-24 DIAGNOSIS — D631 Anemia in chronic kidney disease: Secondary | ICD-10-CM | POA: Diagnosis not present

## 2018-09-24 DIAGNOSIS — E8770 Fluid overload, unspecified: Secondary | ICD-10-CM | POA: Diagnosis not present

## 2018-09-24 DIAGNOSIS — N2581 Secondary hyperparathyroidism of renal origin: Secondary | ICD-10-CM | POA: Diagnosis not present

## 2018-09-24 DIAGNOSIS — N186 End stage renal disease: Secondary | ICD-10-CM | POA: Diagnosis not present

## 2018-09-24 DIAGNOSIS — D509 Iron deficiency anemia, unspecified: Secondary | ICD-10-CM | POA: Diagnosis not present

## 2018-09-26 DIAGNOSIS — M47816 Spondylosis without myelopathy or radiculopathy, lumbar region: Secondary | ICD-10-CM | POA: Diagnosis not present

## 2018-09-26 DIAGNOSIS — E10622 Type 1 diabetes mellitus with other skin ulcer: Secondary | ICD-10-CM | POA: Diagnosis not present

## 2018-09-26 DIAGNOSIS — E10620 Type 1 diabetes mellitus with diabetic dermatitis: Secondary | ICD-10-CM | POA: Diagnosis not present

## 2018-09-26 DIAGNOSIS — E8770 Fluid overload, unspecified: Secondary | ICD-10-CM | POA: Diagnosis not present

## 2018-09-26 DIAGNOSIS — E1129 Type 2 diabetes mellitus with other diabetic kidney complication: Secondary | ICD-10-CM | POA: Diagnosis not present

## 2018-09-26 DIAGNOSIS — I10 Essential (primary) hypertension: Secondary | ICD-10-CM | POA: Diagnosis not present

## 2018-09-26 DIAGNOSIS — D631 Anemia in chronic kidney disease: Secondary | ICD-10-CM | POA: Diagnosis not present

## 2018-09-26 DIAGNOSIS — I255 Ischemic cardiomyopathy: Secondary | ICD-10-CM | POA: Diagnosis not present

## 2018-09-26 DIAGNOSIS — E782 Mixed hyperlipidemia: Secondary | ICD-10-CM | POA: Diagnosis not present

## 2018-09-26 DIAGNOSIS — Z79891 Long term (current) use of opiate analgesic: Secondary | ICD-10-CM | POA: Diagnosis not present

## 2018-09-26 DIAGNOSIS — N186 End stage renal disease: Secondary | ICD-10-CM | POA: Diagnosis not present

## 2018-09-26 DIAGNOSIS — E1042 Type 1 diabetes mellitus with diabetic polyneuropathy: Secondary | ICD-10-CM | POA: Diagnosis not present

## 2018-09-26 DIAGNOSIS — I69321 Dysphasia following cerebral infarction: Secondary | ICD-10-CM | POA: Diagnosis not present

## 2018-09-26 DIAGNOSIS — Z89512 Acquired absence of left leg below knee: Secondary | ICD-10-CM | POA: Diagnosis not present

## 2018-09-26 DIAGNOSIS — D509 Iron deficiency anemia, unspecified: Secondary | ICD-10-CM | POA: Diagnosis not present

## 2018-09-26 DIAGNOSIS — N2581 Secondary hyperparathyroidism of renal origin: Secondary | ICD-10-CM | POA: Diagnosis not present

## 2018-09-30 DIAGNOSIS — D631 Anemia in chronic kidney disease: Secondary | ICD-10-CM | POA: Diagnosis not present

## 2018-09-30 DIAGNOSIS — N186 End stage renal disease: Secondary | ICD-10-CM | POA: Diagnosis not present

## 2018-09-30 DIAGNOSIS — N2581 Secondary hyperparathyroidism of renal origin: Secondary | ICD-10-CM | POA: Diagnosis not present

## 2018-09-30 DIAGNOSIS — E1129 Type 2 diabetes mellitus with other diabetic kidney complication: Secondary | ICD-10-CM | POA: Diagnosis not present

## 2018-09-30 DIAGNOSIS — D509 Iron deficiency anemia, unspecified: Secondary | ICD-10-CM | POA: Diagnosis not present

## 2018-09-30 DIAGNOSIS — E8770 Fluid overload, unspecified: Secondary | ICD-10-CM | POA: Diagnosis not present

## 2018-10-03 DIAGNOSIS — N186 End stage renal disease: Secondary | ICD-10-CM | POA: Diagnosis not present

## 2018-10-03 DIAGNOSIS — E8770 Fluid overload, unspecified: Secondary | ICD-10-CM | POA: Diagnosis not present

## 2018-10-03 DIAGNOSIS — D509 Iron deficiency anemia, unspecified: Secondary | ICD-10-CM | POA: Diagnosis not present

## 2018-10-03 DIAGNOSIS — D631 Anemia in chronic kidney disease: Secondary | ICD-10-CM | POA: Diagnosis not present

## 2018-10-03 DIAGNOSIS — N2581 Secondary hyperparathyroidism of renal origin: Secondary | ICD-10-CM | POA: Diagnosis not present

## 2018-10-03 DIAGNOSIS — E1129 Type 2 diabetes mellitus with other diabetic kidney complication: Secondary | ICD-10-CM | POA: Diagnosis not present

## 2018-10-07 DIAGNOSIS — E8770 Fluid overload, unspecified: Secondary | ICD-10-CM | POA: Diagnosis not present

## 2018-10-07 DIAGNOSIS — D509 Iron deficiency anemia, unspecified: Secondary | ICD-10-CM | POA: Diagnosis not present

## 2018-10-07 DIAGNOSIS — N2581 Secondary hyperparathyroidism of renal origin: Secondary | ICD-10-CM | POA: Diagnosis not present

## 2018-10-07 DIAGNOSIS — E1129 Type 2 diabetes mellitus with other diabetic kidney complication: Secondary | ICD-10-CM | POA: Diagnosis not present

## 2018-10-07 DIAGNOSIS — N186 End stage renal disease: Secondary | ICD-10-CM | POA: Diagnosis not present

## 2018-10-07 DIAGNOSIS — D631 Anemia in chronic kidney disease: Secondary | ICD-10-CM | POA: Diagnosis not present

## 2018-10-10 DIAGNOSIS — D631 Anemia in chronic kidney disease: Secondary | ICD-10-CM | POA: Diagnosis not present

## 2018-10-10 DIAGNOSIS — E1129 Type 2 diabetes mellitus with other diabetic kidney complication: Secondary | ICD-10-CM | POA: Diagnosis not present

## 2018-10-10 DIAGNOSIS — D509 Iron deficiency anemia, unspecified: Secondary | ICD-10-CM | POA: Diagnosis not present

## 2018-10-10 DIAGNOSIS — E8770 Fluid overload, unspecified: Secondary | ICD-10-CM | POA: Diagnosis not present

## 2018-10-10 DIAGNOSIS — N2581 Secondary hyperparathyroidism of renal origin: Secondary | ICD-10-CM | POA: Diagnosis not present

## 2018-10-10 DIAGNOSIS — N186 End stage renal disease: Secondary | ICD-10-CM | POA: Diagnosis not present

## 2018-10-14 DIAGNOSIS — N186 End stage renal disease: Secondary | ICD-10-CM | POA: Diagnosis not present

## 2018-10-14 DIAGNOSIS — N2581 Secondary hyperparathyroidism of renal origin: Secondary | ICD-10-CM | POA: Diagnosis not present

## 2018-10-14 DIAGNOSIS — E8770 Fluid overload, unspecified: Secondary | ICD-10-CM | POA: Diagnosis not present

## 2018-10-14 DIAGNOSIS — E1129 Type 2 diabetes mellitus with other diabetic kidney complication: Secondary | ICD-10-CM | POA: Diagnosis not present

## 2018-10-14 DIAGNOSIS — D631 Anemia in chronic kidney disease: Secondary | ICD-10-CM | POA: Diagnosis not present

## 2018-10-14 DIAGNOSIS — D509 Iron deficiency anemia, unspecified: Secondary | ICD-10-CM | POA: Diagnosis not present

## 2018-10-17 DIAGNOSIS — D631 Anemia in chronic kidney disease: Secondary | ICD-10-CM | POA: Diagnosis not present

## 2018-10-17 DIAGNOSIS — N2581 Secondary hyperparathyroidism of renal origin: Secondary | ICD-10-CM | POA: Diagnosis not present

## 2018-10-17 DIAGNOSIS — D509 Iron deficiency anemia, unspecified: Secondary | ICD-10-CM | POA: Diagnosis not present

## 2018-10-17 DIAGNOSIS — N186 End stage renal disease: Secondary | ICD-10-CM | POA: Diagnosis not present

## 2018-10-17 DIAGNOSIS — E8770 Fluid overload, unspecified: Secondary | ICD-10-CM | POA: Diagnosis not present

## 2018-10-17 DIAGNOSIS — E1129 Type 2 diabetes mellitus with other diabetic kidney complication: Secondary | ICD-10-CM | POA: Diagnosis not present

## 2018-10-19 DIAGNOSIS — D509 Iron deficiency anemia, unspecified: Secondary | ICD-10-CM | POA: Diagnosis not present

## 2018-10-19 DIAGNOSIS — E8770 Fluid overload, unspecified: Secondary | ICD-10-CM | POA: Diagnosis not present

## 2018-10-19 DIAGNOSIS — E1129 Type 2 diabetes mellitus with other diabetic kidney complication: Secondary | ICD-10-CM | POA: Diagnosis not present

## 2018-10-19 DIAGNOSIS — D631 Anemia in chronic kidney disease: Secondary | ICD-10-CM | POA: Diagnosis not present

## 2018-10-19 DIAGNOSIS — N2581 Secondary hyperparathyroidism of renal origin: Secondary | ICD-10-CM | POA: Diagnosis not present

## 2018-10-19 DIAGNOSIS — N186 End stage renal disease: Secondary | ICD-10-CM | POA: Diagnosis not present

## 2018-10-21 DIAGNOSIS — E1129 Type 2 diabetes mellitus with other diabetic kidney complication: Secondary | ICD-10-CM | POA: Diagnosis not present

## 2018-10-21 DIAGNOSIS — D631 Anemia in chronic kidney disease: Secondary | ICD-10-CM | POA: Diagnosis not present

## 2018-10-21 DIAGNOSIS — N2581 Secondary hyperparathyroidism of renal origin: Secondary | ICD-10-CM | POA: Diagnosis not present

## 2018-10-21 DIAGNOSIS — N186 End stage renal disease: Secondary | ICD-10-CM | POA: Diagnosis not present

## 2018-10-21 DIAGNOSIS — E8770 Fluid overload, unspecified: Secondary | ICD-10-CM | POA: Diagnosis not present

## 2018-10-21 DIAGNOSIS — D509 Iron deficiency anemia, unspecified: Secondary | ICD-10-CM | POA: Diagnosis not present

## 2018-10-22 DIAGNOSIS — E1122 Type 2 diabetes mellitus with diabetic chronic kidney disease: Secondary | ICD-10-CM | POA: Diagnosis not present

## 2018-10-22 DIAGNOSIS — Z992 Dependence on renal dialysis: Secondary | ICD-10-CM | POA: Diagnosis not present

## 2018-10-22 DIAGNOSIS — N186 End stage renal disease: Secondary | ICD-10-CM | POA: Diagnosis not present

## 2018-10-24 DIAGNOSIS — E875 Hyperkalemia: Secondary | ICD-10-CM | POA: Diagnosis not present

## 2018-10-24 DIAGNOSIS — N186 End stage renal disease: Secondary | ICD-10-CM | POA: Diagnosis not present

## 2018-10-24 DIAGNOSIS — N2581 Secondary hyperparathyroidism of renal origin: Secondary | ICD-10-CM | POA: Diagnosis not present

## 2018-10-24 DIAGNOSIS — D509 Iron deficiency anemia, unspecified: Secondary | ICD-10-CM | POA: Diagnosis not present

## 2018-10-24 DIAGNOSIS — E1129 Type 2 diabetes mellitus with other diabetic kidney complication: Secondary | ICD-10-CM | POA: Diagnosis not present

## 2018-10-24 DIAGNOSIS — D631 Anemia in chronic kidney disease: Secondary | ICD-10-CM | POA: Diagnosis not present

## 2018-10-26 DIAGNOSIS — N2581 Secondary hyperparathyroidism of renal origin: Secondary | ICD-10-CM | POA: Diagnosis not present

## 2018-10-26 DIAGNOSIS — E1129 Type 2 diabetes mellitus with other diabetic kidney complication: Secondary | ICD-10-CM | POA: Diagnosis not present

## 2018-10-26 DIAGNOSIS — E875 Hyperkalemia: Secondary | ICD-10-CM | POA: Diagnosis not present

## 2018-10-26 DIAGNOSIS — D509 Iron deficiency anemia, unspecified: Secondary | ICD-10-CM | POA: Diagnosis not present

## 2018-10-26 DIAGNOSIS — D631 Anemia in chronic kidney disease: Secondary | ICD-10-CM | POA: Diagnosis not present

## 2018-10-26 DIAGNOSIS — N186 End stage renal disease: Secondary | ICD-10-CM | POA: Diagnosis not present

## 2018-10-28 DIAGNOSIS — E1129 Type 2 diabetes mellitus with other diabetic kidney complication: Secondary | ICD-10-CM | POA: Diagnosis not present

## 2018-10-28 DIAGNOSIS — E875 Hyperkalemia: Secondary | ICD-10-CM | POA: Diagnosis not present

## 2018-10-28 DIAGNOSIS — D509 Iron deficiency anemia, unspecified: Secondary | ICD-10-CM | POA: Diagnosis not present

## 2018-10-28 DIAGNOSIS — N2581 Secondary hyperparathyroidism of renal origin: Secondary | ICD-10-CM | POA: Diagnosis not present

## 2018-10-28 DIAGNOSIS — D631 Anemia in chronic kidney disease: Secondary | ICD-10-CM | POA: Diagnosis not present

## 2018-10-28 DIAGNOSIS — N186 End stage renal disease: Secondary | ICD-10-CM | POA: Diagnosis not present

## 2018-10-31 DIAGNOSIS — E875 Hyperkalemia: Secondary | ICD-10-CM | POA: Diagnosis not present

## 2018-10-31 DIAGNOSIS — E1129 Type 2 diabetes mellitus with other diabetic kidney complication: Secondary | ICD-10-CM | POA: Diagnosis not present

## 2018-10-31 DIAGNOSIS — D509 Iron deficiency anemia, unspecified: Secondary | ICD-10-CM | POA: Diagnosis not present

## 2018-10-31 DIAGNOSIS — N186 End stage renal disease: Secondary | ICD-10-CM | POA: Diagnosis not present

## 2018-10-31 DIAGNOSIS — N2581 Secondary hyperparathyroidism of renal origin: Secondary | ICD-10-CM | POA: Diagnosis not present

## 2018-10-31 DIAGNOSIS — D631 Anemia in chronic kidney disease: Secondary | ICD-10-CM | POA: Diagnosis not present

## 2018-11-02 DIAGNOSIS — H21263 Iris atrophy (essential) (progressive), bilateral: Secondary | ICD-10-CM | POA: Diagnosis not present

## 2018-11-02 DIAGNOSIS — Z794 Long term (current) use of insulin: Secondary | ICD-10-CM | POA: Diagnosis not present

## 2018-11-02 DIAGNOSIS — H25013 Cortical age-related cataract, bilateral: Secondary | ICD-10-CM | POA: Diagnosis not present

## 2018-11-02 DIAGNOSIS — H524 Presbyopia: Secondary | ICD-10-CM | POA: Diagnosis not present

## 2018-11-02 DIAGNOSIS — H2513 Age-related nuclear cataract, bilateral: Secondary | ICD-10-CM | POA: Diagnosis not present

## 2018-11-02 DIAGNOSIS — E113523 Type 2 diabetes mellitus with proliferative diabetic retinopathy with traction retinal detachment involving the macula, bilateral: Secondary | ICD-10-CM | POA: Diagnosis not present

## 2018-11-02 DIAGNOSIS — H4321 Crystalline deposits in vitreous body, right eye: Secondary | ICD-10-CM | POA: Diagnosis not present

## 2018-11-02 DIAGNOSIS — H25042 Posterior subcapsular polar age-related cataract, left eye: Secondary | ICD-10-CM | POA: Diagnosis not present

## 2018-11-02 DIAGNOSIS — H4312 Vitreous hemorrhage, left eye: Secondary | ICD-10-CM | POA: Diagnosis not present

## 2018-11-03 DIAGNOSIS — D509 Iron deficiency anemia, unspecified: Secondary | ICD-10-CM | POA: Diagnosis not present

## 2018-11-03 DIAGNOSIS — N2581 Secondary hyperparathyroidism of renal origin: Secondary | ICD-10-CM | POA: Diagnosis not present

## 2018-11-03 DIAGNOSIS — D631 Anemia in chronic kidney disease: Secondary | ICD-10-CM | POA: Diagnosis not present

## 2018-11-03 DIAGNOSIS — N186 End stage renal disease: Secondary | ICD-10-CM | POA: Diagnosis not present

## 2018-11-03 DIAGNOSIS — E875 Hyperkalemia: Secondary | ICD-10-CM | POA: Diagnosis not present

## 2018-11-03 DIAGNOSIS — E1129 Type 2 diabetes mellitus with other diabetic kidney complication: Secondary | ICD-10-CM | POA: Diagnosis not present

## 2018-11-04 DIAGNOSIS — N2581 Secondary hyperparathyroidism of renal origin: Secondary | ICD-10-CM | POA: Diagnosis not present

## 2018-11-04 DIAGNOSIS — E1129 Type 2 diabetes mellitus with other diabetic kidney complication: Secondary | ICD-10-CM | POA: Diagnosis not present

## 2018-11-04 DIAGNOSIS — E875 Hyperkalemia: Secondary | ICD-10-CM | POA: Diagnosis not present

## 2018-11-04 DIAGNOSIS — D509 Iron deficiency anemia, unspecified: Secondary | ICD-10-CM | POA: Diagnosis not present

## 2018-11-04 DIAGNOSIS — D631 Anemia in chronic kidney disease: Secondary | ICD-10-CM | POA: Diagnosis not present

## 2018-11-04 DIAGNOSIS — N186 End stage renal disease: Secondary | ICD-10-CM | POA: Diagnosis not present

## 2018-11-07 DIAGNOSIS — D509 Iron deficiency anemia, unspecified: Secondary | ICD-10-CM | POA: Diagnosis not present

## 2018-11-07 DIAGNOSIS — N2581 Secondary hyperparathyroidism of renal origin: Secondary | ICD-10-CM | POA: Diagnosis not present

## 2018-11-07 DIAGNOSIS — D631 Anemia in chronic kidney disease: Secondary | ICD-10-CM | POA: Diagnosis not present

## 2018-11-07 DIAGNOSIS — E875 Hyperkalemia: Secondary | ICD-10-CM | POA: Diagnosis not present

## 2018-11-07 DIAGNOSIS — N186 End stage renal disease: Secondary | ICD-10-CM | POA: Diagnosis not present

## 2018-11-07 DIAGNOSIS — E1129 Type 2 diabetes mellitus with other diabetic kidney complication: Secondary | ICD-10-CM | POA: Diagnosis not present

## 2018-11-09 DIAGNOSIS — E1122 Type 2 diabetes mellitus with diabetic chronic kidney disease: Secondary | ICD-10-CM | POA: Diagnosis not present

## 2018-11-09 DIAGNOSIS — N186 End stage renal disease: Secondary | ICD-10-CM | POA: Diagnosis not present

## 2018-11-09 DIAGNOSIS — I120 Hypertensive chronic kidney disease with stage 5 chronic kidney disease or end stage renal disease: Secondary | ICD-10-CM | POA: Diagnosis not present

## 2018-11-09 DIAGNOSIS — R531 Weakness: Secondary | ICD-10-CM | POA: Diagnosis not present

## 2018-11-09 DIAGNOSIS — I10 Essential (primary) hypertension: Secondary | ICD-10-CM | POA: Diagnosis not present

## 2018-11-09 DIAGNOSIS — Z794 Long term (current) use of insulin: Secondary | ICD-10-CM | POA: Diagnosis not present

## 2018-11-09 DIAGNOSIS — E875 Hyperkalemia: Secondary | ICD-10-CM | POA: Diagnosis not present

## 2018-11-09 DIAGNOSIS — E1129 Type 2 diabetes mellitus with other diabetic kidney complication: Secondary | ICD-10-CM | POA: Diagnosis not present

## 2018-11-09 DIAGNOSIS — D631 Anemia in chronic kidney disease: Secondary | ICD-10-CM | POA: Diagnosis not present

## 2018-11-09 DIAGNOSIS — S90811A Abrasion, right foot, initial encounter: Secondary | ICD-10-CM | POA: Diagnosis not present

## 2018-11-09 DIAGNOSIS — E1165 Type 2 diabetes mellitus with hyperglycemia: Secondary | ICD-10-CM | POA: Diagnosis not present

## 2018-11-09 DIAGNOSIS — D509 Iron deficiency anemia, unspecified: Secondary | ICD-10-CM | POA: Diagnosis not present

## 2018-11-09 DIAGNOSIS — Z992 Dependence on renal dialysis: Secondary | ICD-10-CM | POA: Diagnosis not present

## 2018-11-09 DIAGNOSIS — N2581 Secondary hyperparathyroidism of renal origin: Secondary | ICD-10-CM | POA: Diagnosis not present

## 2018-11-09 DIAGNOSIS — R58 Hemorrhage, not elsewhere classified: Secondary | ICD-10-CM | POA: Diagnosis not present

## 2018-11-11 DIAGNOSIS — N2581 Secondary hyperparathyroidism of renal origin: Secondary | ICD-10-CM | POA: Diagnosis not present

## 2018-11-11 DIAGNOSIS — D509 Iron deficiency anemia, unspecified: Secondary | ICD-10-CM | POA: Diagnosis not present

## 2018-11-11 DIAGNOSIS — N186 End stage renal disease: Secondary | ICD-10-CM | POA: Diagnosis not present

## 2018-11-11 DIAGNOSIS — E1129 Type 2 diabetes mellitus with other diabetic kidney complication: Secondary | ICD-10-CM | POA: Diagnosis not present

## 2018-11-11 DIAGNOSIS — D631 Anemia in chronic kidney disease: Secondary | ICD-10-CM | POA: Diagnosis not present

## 2018-11-11 DIAGNOSIS — E875 Hyperkalemia: Secondary | ICD-10-CM | POA: Diagnosis not present

## 2018-11-14 DIAGNOSIS — D631 Anemia in chronic kidney disease: Secondary | ICD-10-CM | POA: Diagnosis not present

## 2018-11-14 DIAGNOSIS — E1129 Type 2 diabetes mellitus with other diabetic kidney complication: Secondary | ICD-10-CM | POA: Diagnosis not present

## 2018-11-14 DIAGNOSIS — E875 Hyperkalemia: Secondary | ICD-10-CM | POA: Diagnosis not present

## 2018-11-14 DIAGNOSIS — N186 End stage renal disease: Secondary | ICD-10-CM | POA: Diagnosis not present

## 2018-11-14 DIAGNOSIS — N2581 Secondary hyperparathyroidism of renal origin: Secondary | ICD-10-CM | POA: Diagnosis not present

## 2018-11-14 DIAGNOSIS — E1165 Type 2 diabetes mellitus with hyperglycemia: Secondary | ICD-10-CM | POA: Insufficient documentation

## 2018-11-14 DIAGNOSIS — D509 Iron deficiency anemia, unspecified: Secondary | ICD-10-CM | POA: Diagnosis not present

## 2018-11-16 DIAGNOSIS — E875 Hyperkalemia: Secondary | ICD-10-CM | POA: Diagnosis not present

## 2018-11-16 DIAGNOSIS — N2581 Secondary hyperparathyroidism of renal origin: Secondary | ICD-10-CM | POA: Diagnosis not present

## 2018-11-16 DIAGNOSIS — D631 Anemia in chronic kidney disease: Secondary | ICD-10-CM | POA: Diagnosis not present

## 2018-11-16 DIAGNOSIS — N186 End stage renal disease: Secondary | ICD-10-CM | POA: Diagnosis not present

## 2018-11-16 DIAGNOSIS — D509 Iron deficiency anemia, unspecified: Secondary | ICD-10-CM | POA: Diagnosis not present

## 2018-11-16 DIAGNOSIS — E1129 Type 2 diabetes mellitus with other diabetic kidney complication: Secondary | ICD-10-CM | POA: Diagnosis not present

## 2018-11-19 DIAGNOSIS — E875 Hyperkalemia: Secondary | ICD-10-CM | POA: Diagnosis not present

## 2018-11-19 DIAGNOSIS — N2581 Secondary hyperparathyroidism of renal origin: Secondary | ICD-10-CM | POA: Diagnosis not present

## 2018-11-19 DIAGNOSIS — E1129 Type 2 diabetes mellitus with other diabetic kidney complication: Secondary | ICD-10-CM | POA: Diagnosis not present

## 2018-11-19 DIAGNOSIS — D631 Anemia in chronic kidney disease: Secondary | ICD-10-CM | POA: Diagnosis not present

## 2018-11-19 DIAGNOSIS — D509 Iron deficiency anemia, unspecified: Secondary | ICD-10-CM | POA: Diagnosis not present

## 2018-11-19 DIAGNOSIS — N186 End stage renal disease: Secondary | ICD-10-CM | POA: Diagnosis not present

## 2018-11-20 DIAGNOSIS — Z992 Dependence on renal dialysis: Secondary | ICD-10-CM | POA: Diagnosis not present

## 2018-11-20 DIAGNOSIS — N186 End stage renal disease: Secondary | ICD-10-CM | POA: Diagnosis not present

## 2018-11-20 DIAGNOSIS — E1122 Type 2 diabetes mellitus with diabetic chronic kidney disease: Secondary | ICD-10-CM | POA: Diagnosis not present

## 2018-11-21 DIAGNOSIS — N186 End stage renal disease: Secondary | ICD-10-CM | POA: Diagnosis not present

## 2018-11-21 DIAGNOSIS — N2581 Secondary hyperparathyroidism of renal origin: Secondary | ICD-10-CM | POA: Diagnosis not present

## 2018-11-21 DIAGNOSIS — D631 Anemia in chronic kidney disease: Secondary | ICD-10-CM | POA: Diagnosis not present

## 2018-11-21 DIAGNOSIS — E1129 Type 2 diabetes mellitus with other diabetic kidney complication: Secondary | ICD-10-CM | POA: Diagnosis not present

## 2018-11-22 DIAGNOSIS — F424 Excoriation (skin-picking) disorder: Secondary | ICD-10-CM | POA: Insufficient documentation

## 2018-11-23 DIAGNOSIS — N186 End stage renal disease: Secondary | ICD-10-CM | POA: Diagnosis not present

## 2018-11-23 DIAGNOSIS — N2581 Secondary hyperparathyroidism of renal origin: Secondary | ICD-10-CM | POA: Diagnosis not present

## 2018-11-23 DIAGNOSIS — D631 Anemia in chronic kidney disease: Secondary | ICD-10-CM | POA: Diagnosis not present

## 2018-11-23 DIAGNOSIS — E1129 Type 2 diabetes mellitus with other diabetic kidney complication: Secondary | ICD-10-CM | POA: Diagnosis not present

## 2018-11-25 DIAGNOSIS — E1129 Type 2 diabetes mellitus with other diabetic kidney complication: Secondary | ICD-10-CM | POA: Diagnosis not present

## 2018-11-25 DIAGNOSIS — D631 Anemia in chronic kidney disease: Secondary | ICD-10-CM | POA: Diagnosis not present

## 2018-11-25 DIAGNOSIS — N186 End stage renal disease: Secondary | ICD-10-CM | POA: Diagnosis not present

## 2018-11-25 DIAGNOSIS — N2581 Secondary hyperparathyroidism of renal origin: Secondary | ICD-10-CM | POA: Diagnosis not present

## 2018-11-30 DIAGNOSIS — E1129 Type 2 diabetes mellitus with other diabetic kidney complication: Secondary | ICD-10-CM | POA: Diagnosis not present

## 2018-11-30 DIAGNOSIS — N186 End stage renal disease: Secondary | ICD-10-CM | POA: Diagnosis not present

## 2018-11-30 DIAGNOSIS — D631 Anemia in chronic kidney disease: Secondary | ICD-10-CM | POA: Diagnosis not present

## 2018-11-30 DIAGNOSIS — N2581 Secondary hyperparathyroidism of renal origin: Secondary | ICD-10-CM | POA: Diagnosis not present

## 2018-12-02 DIAGNOSIS — E1129 Type 2 diabetes mellitus with other diabetic kidney complication: Secondary | ICD-10-CM | POA: Diagnosis not present

## 2018-12-02 DIAGNOSIS — N2581 Secondary hyperparathyroidism of renal origin: Secondary | ICD-10-CM | POA: Diagnosis not present

## 2018-12-02 DIAGNOSIS — D631 Anemia in chronic kidney disease: Secondary | ICD-10-CM | POA: Diagnosis not present

## 2018-12-02 DIAGNOSIS — N186 End stage renal disease: Secondary | ICD-10-CM | POA: Diagnosis not present

## 2018-12-05 DIAGNOSIS — N2581 Secondary hyperparathyroidism of renal origin: Secondary | ICD-10-CM | POA: Diagnosis not present

## 2018-12-05 DIAGNOSIS — D631 Anemia in chronic kidney disease: Secondary | ICD-10-CM | POA: Diagnosis not present

## 2018-12-05 DIAGNOSIS — E1129 Type 2 diabetes mellitus with other diabetic kidney complication: Secondary | ICD-10-CM | POA: Diagnosis not present

## 2018-12-05 DIAGNOSIS — N186 End stage renal disease: Secondary | ICD-10-CM | POA: Diagnosis not present

## 2018-12-08 DIAGNOSIS — D631 Anemia in chronic kidney disease: Secondary | ICD-10-CM | POA: Diagnosis not present

## 2018-12-08 DIAGNOSIS — N2581 Secondary hyperparathyroidism of renal origin: Secondary | ICD-10-CM | POA: Diagnosis not present

## 2018-12-08 DIAGNOSIS — E1129 Type 2 diabetes mellitus with other diabetic kidney complication: Secondary | ICD-10-CM | POA: Diagnosis not present

## 2018-12-08 DIAGNOSIS — N186 End stage renal disease: Secondary | ICD-10-CM | POA: Diagnosis not present

## 2018-12-12 DIAGNOSIS — D631 Anemia in chronic kidney disease: Secondary | ICD-10-CM | POA: Diagnosis not present

## 2018-12-12 DIAGNOSIS — N186 End stage renal disease: Secondary | ICD-10-CM | POA: Diagnosis not present

## 2018-12-12 DIAGNOSIS — E1129 Type 2 diabetes mellitus with other diabetic kidney complication: Secondary | ICD-10-CM | POA: Diagnosis not present

## 2018-12-12 DIAGNOSIS — N2581 Secondary hyperparathyroidism of renal origin: Secondary | ICD-10-CM | POA: Diagnosis not present

## 2018-12-14 DIAGNOSIS — N2581 Secondary hyperparathyroidism of renal origin: Secondary | ICD-10-CM | POA: Diagnosis not present

## 2018-12-14 DIAGNOSIS — D631 Anemia in chronic kidney disease: Secondary | ICD-10-CM | POA: Diagnosis not present

## 2018-12-14 DIAGNOSIS — E1129 Type 2 diabetes mellitus with other diabetic kidney complication: Secondary | ICD-10-CM | POA: Diagnosis not present

## 2018-12-14 DIAGNOSIS — N186 End stage renal disease: Secondary | ICD-10-CM | POA: Diagnosis not present

## 2018-12-15 DIAGNOSIS — E8770 Fluid overload, unspecified: Secondary | ICD-10-CM | POA: Diagnosis not present

## 2018-12-15 DIAGNOSIS — N2581 Secondary hyperparathyroidism of renal origin: Secondary | ICD-10-CM | POA: Diagnosis not present

## 2018-12-15 DIAGNOSIS — N186 End stage renal disease: Secondary | ICD-10-CM | POA: Diagnosis not present

## 2018-12-16 DIAGNOSIS — N186 End stage renal disease: Secondary | ICD-10-CM | POA: Diagnosis not present

## 2018-12-16 DIAGNOSIS — D631 Anemia in chronic kidney disease: Secondary | ICD-10-CM | POA: Diagnosis not present

## 2018-12-16 DIAGNOSIS — E1129 Type 2 diabetes mellitus with other diabetic kidney complication: Secondary | ICD-10-CM | POA: Diagnosis not present

## 2018-12-16 DIAGNOSIS — N2581 Secondary hyperparathyroidism of renal origin: Secondary | ICD-10-CM | POA: Diagnosis not present

## 2018-12-19 DIAGNOSIS — E1129 Type 2 diabetes mellitus with other diabetic kidney complication: Secondary | ICD-10-CM | POA: Diagnosis not present

## 2018-12-19 DIAGNOSIS — D631 Anemia in chronic kidney disease: Secondary | ICD-10-CM | POA: Diagnosis not present

## 2018-12-19 DIAGNOSIS — N186 End stage renal disease: Secondary | ICD-10-CM | POA: Diagnosis not present

## 2018-12-19 DIAGNOSIS — N2581 Secondary hyperparathyroidism of renal origin: Secondary | ICD-10-CM | POA: Diagnosis not present

## 2018-12-21 DIAGNOSIS — E1129 Type 2 diabetes mellitus with other diabetic kidney complication: Secondary | ICD-10-CM | POA: Diagnosis not present

## 2018-12-21 DIAGNOSIS — Z992 Dependence on renal dialysis: Secondary | ICD-10-CM | POA: Diagnosis not present

## 2018-12-21 DIAGNOSIS — D631 Anemia in chronic kidney disease: Secondary | ICD-10-CM | POA: Diagnosis not present

## 2018-12-21 DIAGNOSIS — N186 End stage renal disease: Secondary | ICD-10-CM | POA: Diagnosis not present

## 2018-12-21 DIAGNOSIS — E1122 Type 2 diabetes mellitus with diabetic chronic kidney disease: Secondary | ICD-10-CM | POA: Diagnosis not present

## 2018-12-21 DIAGNOSIS — N2581 Secondary hyperparathyroidism of renal origin: Secondary | ICD-10-CM | POA: Diagnosis not present

## 2018-12-23 DIAGNOSIS — N2581 Secondary hyperparathyroidism of renal origin: Secondary | ICD-10-CM | POA: Diagnosis not present

## 2018-12-23 DIAGNOSIS — D631 Anemia in chronic kidney disease: Secondary | ICD-10-CM | POA: Diagnosis not present

## 2018-12-23 DIAGNOSIS — N186 End stage renal disease: Secondary | ICD-10-CM | POA: Diagnosis not present

## 2018-12-23 DIAGNOSIS — E1129 Type 2 diabetes mellitus with other diabetic kidney complication: Secondary | ICD-10-CM | POA: Diagnosis not present

## 2018-12-26 DIAGNOSIS — E10622 Type 1 diabetes mellitus with other skin ulcer: Secondary | ICD-10-CM | POA: Diagnosis not present

## 2018-12-26 DIAGNOSIS — M47816 Spondylosis without myelopathy or radiculopathy, lumbar region: Secondary | ICD-10-CM | POA: Diagnosis not present

## 2018-12-26 DIAGNOSIS — E10620 Type 1 diabetes mellitus with diabetic dermatitis: Secondary | ICD-10-CM | POA: Diagnosis not present

## 2018-12-26 DIAGNOSIS — I255 Ischemic cardiomyopathy: Secondary | ICD-10-CM | POA: Diagnosis not present

## 2018-12-26 DIAGNOSIS — N186 End stage renal disease: Secondary | ICD-10-CM | POA: Diagnosis not present

## 2018-12-26 DIAGNOSIS — Z79891 Long term (current) use of opiate analgesic: Secondary | ICD-10-CM | POA: Diagnosis not present

## 2018-12-26 DIAGNOSIS — E782 Mixed hyperlipidemia: Secondary | ICD-10-CM | POA: Diagnosis not present

## 2018-12-26 DIAGNOSIS — Z89512 Acquired absence of left leg below knee: Secondary | ICD-10-CM | POA: Diagnosis not present

## 2018-12-26 DIAGNOSIS — I10 Essential (primary) hypertension: Secondary | ICD-10-CM | POA: Diagnosis not present

## 2018-12-26 DIAGNOSIS — I69321 Dysphasia following cerebral infarction: Secondary | ICD-10-CM | POA: Diagnosis not present

## 2018-12-26 DIAGNOSIS — E1042 Type 1 diabetes mellitus with diabetic polyneuropathy: Secondary | ICD-10-CM | POA: Diagnosis not present

## 2018-12-27 DIAGNOSIS — D631 Anemia in chronic kidney disease: Secondary | ICD-10-CM | POA: Diagnosis not present

## 2018-12-27 DIAGNOSIS — E782 Mixed hyperlipidemia: Secondary | ICD-10-CM | POA: Diagnosis not present

## 2018-12-27 DIAGNOSIS — E1129 Type 2 diabetes mellitus with other diabetic kidney complication: Secondary | ICD-10-CM | POA: Diagnosis not present

## 2018-12-27 DIAGNOSIS — E1042 Type 1 diabetes mellitus with diabetic polyneuropathy: Secondary | ICD-10-CM | POA: Diagnosis not present

## 2018-12-27 DIAGNOSIS — N2581 Secondary hyperparathyroidism of renal origin: Secondary | ICD-10-CM | POA: Diagnosis not present

## 2018-12-27 DIAGNOSIS — I10 Essential (primary) hypertension: Secondary | ICD-10-CM | POA: Diagnosis not present

## 2018-12-27 DIAGNOSIS — N186 End stage renal disease: Secondary | ICD-10-CM | POA: Diagnosis not present

## 2018-12-28 DIAGNOSIS — N2581 Secondary hyperparathyroidism of renal origin: Secondary | ICD-10-CM | POA: Diagnosis not present

## 2018-12-28 DIAGNOSIS — N186 End stage renal disease: Secondary | ICD-10-CM | POA: Diagnosis not present

## 2018-12-28 DIAGNOSIS — D631 Anemia in chronic kidney disease: Secondary | ICD-10-CM | POA: Diagnosis not present

## 2018-12-28 DIAGNOSIS — E1129 Type 2 diabetes mellitus with other diabetic kidney complication: Secondary | ICD-10-CM | POA: Diagnosis not present

## 2018-12-30 DIAGNOSIS — D631 Anemia in chronic kidney disease: Secondary | ICD-10-CM | POA: Diagnosis not present

## 2018-12-30 DIAGNOSIS — N186 End stage renal disease: Secondary | ICD-10-CM | POA: Diagnosis not present

## 2018-12-30 DIAGNOSIS — E1129 Type 2 diabetes mellitus with other diabetic kidney complication: Secondary | ICD-10-CM | POA: Diagnosis not present

## 2018-12-30 DIAGNOSIS — N2581 Secondary hyperparathyroidism of renal origin: Secondary | ICD-10-CM | POA: Diagnosis not present

## 2019-01-04 DIAGNOSIS — N2581 Secondary hyperparathyroidism of renal origin: Secondary | ICD-10-CM | POA: Diagnosis not present

## 2019-01-04 DIAGNOSIS — N186 End stage renal disease: Secondary | ICD-10-CM | POA: Diagnosis not present

## 2019-01-04 DIAGNOSIS — D631 Anemia in chronic kidney disease: Secondary | ICD-10-CM | POA: Diagnosis not present

## 2019-01-04 DIAGNOSIS — E1129 Type 2 diabetes mellitus with other diabetic kidney complication: Secondary | ICD-10-CM | POA: Diagnosis not present

## 2019-01-05 ENCOUNTER — Other Ambulatory Visit: Payer: Self-pay

## 2019-01-05 DIAGNOSIS — I739 Peripheral vascular disease, unspecified: Secondary | ICD-10-CM

## 2019-01-06 DIAGNOSIS — N186 End stage renal disease: Secondary | ICD-10-CM | POA: Diagnosis not present

## 2019-01-06 DIAGNOSIS — E1129 Type 2 diabetes mellitus with other diabetic kidney complication: Secondary | ICD-10-CM | POA: Diagnosis not present

## 2019-01-06 DIAGNOSIS — D631 Anemia in chronic kidney disease: Secondary | ICD-10-CM | POA: Diagnosis not present

## 2019-01-06 DIAGNOSIS — N2581 Secondary hyperparathyroidism of renal origin: Secondary | ICD-10-CM | POA: Diagnosis not present

## 2019-01-09 DIAGNOSIS — E1129 Type 2 diabetes mellitus with other diabetic kidney complication: Secondary | ICD-10-CM | POA: Diagnosis not present

## 2019-01-09 DIAGNOSIS — N2581 Secondary hyperparathyroidism of renal origin: Secondary | ICD-10-CM | POA: Diagnosis not present

## 2019-01-09 DIAGNOSIS — D631 Anemia in chronic kidney disease: Secondary | ICD-10-CM | POA: Diagnosis not present

## 2019-01-09 DIAGNOSIS — N186 End stage renal disease: Secondary | ICD-10-CM | POA: Diagnosis not present

## 2019-01-10 ENCOUNTER — Encounter: Payer: Medicare Other | Admitting: Vascular Surgery

## 2019-01-10 ENCOUNTER — Encounter: Payer: Self-pay | Admitting: Family

## 2019-01-10 ENCOUNTER — Encounter (HOSPITAL_COMMUNITY): Payer: Self-pay

## 2019-01-11 DIAGNOSIS — D631 Anemia in chronic kidney disease: Secondary | ICD-10-CM | POA: Diagnosis not present

## 2019-01-11 DIAGNOSIS — E1129 Type 2 diabetes mellitus with other diabetic kidney complication: Secondary | ICD-10-CM | POA: Diagnosis not present

## 2019-01-11 DIAGNOSIS — N186 End stage renal disease: Secondary | ICD-10-CM | POA: Diagnosis not present

## 2019-01-11 DIAGNOSIS — N2581 Secondary hyperparathyroidism of renal origin: Secondary | ICD-10-CM | POA: Diagnosis not present

## 2019-01-13 DIAGNOSIS — N186 End stage renal disease: Secondary | ICD-10-CM | POA: Diagnosis not present

## 2019-01-13 DIAGNOSIS — D631 Anemia in chronic kidney disease: Secondary | ICD-10-CM | POA: Diagnosis not present

## 2019-01-13 DIAGNOSIS — E1129 Type 2 diabetes mellitus with other diabetic kidney complication: Secondary | ICD-10-CM | POA: Diagnosis not present

## 2019-01-13 DIAGNOSIS — N2581 Secondary hyperparathyroidism of renal origin: Secondary | ICD-10-CM | POA: Diagnosis not present

## 2019-01-14 DIAGNOSIS — N2581 Secondary hyperparathyroidism of renal origin: Secondary | ICD-10-CM | POA: Diagnosis not present

## 2019-01-14 DIAGNOSIS — N186 End stage renal disease: Secondary | ICD-10-CM | POA: Diagnosis not present

## 2019-01-14 DIAGNOSIS — E8770 Fluid overload, unspecified: Secondary | ICD-10-CM | POA: Diagnosis not present

## 2019-01-16 DIAGNOSIS — E1129 Type 2 diabetes mellitus with other diabetic kidney complication: Secondary | ICD-10-CM | POA: Diagnosis not present

## 2019-01-16 DIAGNOSIS — N2581 Secondary hyperparathyroidism of renal origin: Secondary | ICD-10-CM | POA: Diagnosis not present

## 2019-01-16 DIAGNOSIS — N186 End stage renal disease: Secondary | ICD-10-CM | POA: Diagnosis not present

## 2019-01-16 DIAGNOSIS — D631 Anemia in chronic kidney disease: Secondary | ICD-10-CM | POA: Diagnosis not present

## 2019-01-18 DIAGNOSIS — N2581 Secondary hyperparathyroidism of renal origin: Secondary | ICD-10-CM | POA: Diagnosis not present

## 2019-01-18 DIAGNOSIS — E1129 Type 2 diabetes mellitus with other diabetic kidney complication: Secondary | ICD-10-CM | POA: Diagnosis not present

## 2019-01-18 DIAGNOSIS — D631 Anemia in chronic kidney disease: Secondary | ICD-10-CM | POA: Diagnosis not present

## 2019-01-18 DIAGNOSIS — N186 End stage renal disease: Secondary | ICD-10-CM | POA: Diagnosis not present

## 2019-01-20 DIAGNOSIS — E8779 Other fluid overload: Secondary | ICD-10-CM | POA: Diagnosis not present

## 2019-01-20 DIAGNOSIS — N186 End stage renal disease: Secondary | ICD-10-CM | POA: Diagnosis not present

## 2019-01-20 DIAGNOSIS — N2581 Secondary hyperparathyroidism of renal origin: Secondary | ICD-10-CM | POA: Diagnosis not present

## 2019-01-20 DIAGNOSIS — Z992 Dependence on renal dialysis: Secondary | ICD-10-CM | POA: Diagnosis not present

## 2019-01-20 DIAGNOSIS — D631 Anemia in chronic kidney disease: Secondary | ICD-10-CM | POA: Diagnosis not present

## 2019-01-20 DIAGNOSIS — E1129 Type 2 diabetes mellitus with other diabetic kidney complication: Secondary | ICD-10-CM | POA: Diagnosis not present

## 2019-01-20 DIAGNOSIS — E1122 Type 2 diabetes mellitus with diabetic chronic kidney disease: Secondary | ICD-10-CM | POA: Diagnosis not present

## 2019-01-23 DIAGNOSIS — E1129 Type 2 diabetes mellitus with other diabetic kidney complication: Secondary | ICD-10-CM | POA: Diagnosis not present

## 2019-01-23 DIAGNOSIS — N2581 Secondary hyperparathyroidism of renal origin: Secondary | ICD-10-CM | POA: Diagnosis not present

## 2019-01-23 DIAGNOSIS — E8779 Other fluid overload: Secondary | ICD-10-CM | POA: Diagnosis not present

## 2019-01-23 DIAGNOSIS — N186 End stage renal disease: Secondary | ICD-10-CM | POA: Diagnosis not present

## 2019-01-23 DIAGNOSIS — D631 Anemia in chronic kidney disease: Secondary | ICD-10-CM | POA: Diagnosis not present

## 2019-01-25 DIAGNOSIS — E8779 Other fluid overload: Secondary | ICD-10-CM | POA: Diagnosis not present

## 2019-01-25 DIAGNOSIS — D631 Anemia in chronic kidney disease: Secondary | ICD-10-CM | POA: Diagnosis not present

## 2019-01-25 DIAGNOSIS — E1129 Type 2 diabetes mellitus with other diabetic kidney complication: Secondary | ICD-10-CM | POA: Diagnosis not present

## 2019-01-25 DIAGNOSIS — N2581 Secondary hyperparathyroidism of renal origin: Secondary | ICD-10-CM | POA: Diagnosis not present

## 2019-01-25 DIAGNOSIS — N186 End stage renal disease: Secondary | ICD-10-CM | POA: Diagnosis not present

## 2019-01-27 DIAGNOSIS — E8779 Other fluid overload: Secondary | ICD-10-CM | POA: Diagnosis not present

## 2019-01-27 DIAGNOSIS — N186 End stage renal disease: Secondary | ICD-10-CM | POA: Diagnosis not present

## 2019-01-27 DIAGNOSIS — E1129 Type 2 diabetes mellitus with other diabetic kidney complication: Secondary | ICD-10-CM | POA: Diagnosis not present

## 2019-01-27 DIAGNOSIS — N2581 Secondary hyperparathyroidism of renal origin: Secondary | ICD-10-CM | POA: Diagnosis not present

## 2019-01-27 DIAGNOSIS — D631 Anemia in chronic kidney disease: Secondary | ICD-10-CM | POA: Diagnosis not present

## 2019-01-28 DIAGNOSIS — E8770 Fluid overload, unspecified: Secondary | ICD-10-CM | POA: Diagnosis not present

## 2019-01-28 DIAGNOSIS — N2581 Secondary hyperparathyroidism of renal origin: Secondary | ICD-10-CM | POA: Diagnosis not present

## 2019-01-28 DIAGNOSIS — N186 End stage renal disease: Secondary | ICD-10-CM | POA: Diagnosis not present

## 2019-01-30 DIAGNOSIS — E1129 Type 2 diabetes mellitus with other diabetic kidney complication: Secondary | ICD-10-CM | POA: Diagnosis not present

## 2019-01-30 DIAGNOSIS — E8779 Other fluid overload: Secondary | ICD-10-CM | POA: Diagnosis not present

## 2019-01-30 DIAGNOSIS — N2581 Secondary hyperparathyroidism of renal origin: Secondary | ICD-10-CM | POA: Diagnosis not present

## 2019-01-30 DIAGNOSIS — D631 Anemia in chronic kidney disease: Secondary | ICD-10-CM | POA: Diagnosis not present

## 2019-01-30 DIAGNOSIS — N186 End stage renal disease: Secondary | ICD-10-CM | POA: Diagnosis not present

## 2019-02-02 DIAGNOSIS — N2581 Secondary hyperparathyroidism of renal origin: Secondary | ICD-10-CM | POA: Diagnosis not present

## 2019-02-02 DIAGNOSIS — D631 Anemia in chronic kidney disease: Secondary | ICD-10-CM | POA: Diagnosis not present

## 2019-02-02 DIAGNOSIS — E8779 Other fluid overload: Secondary | ICD-10-CM | POA: Diagnosis not present

## 2019-02-02 DIAGNOSIS — N186 End stage renal disease: Secondary | ICD-10-CM | POA: Diagnosis not present

## 2019-02-02 DIAGNOSIS — E1129 Type 2 diabetes mellitus with other diabetic kidney complication: Secondary | ICD-10-CM | POA: Diagnosis not present

## 2019-02-03 DIAGNOSIS — E1129 Type 2 diabetes mellitus with other diabetic kidney complication: Secondary | ICD-10-CM | POA: Diagnosis not present

## 2019-02-03 DIAGNOSIS — D631 Anemia in chronic kidney disease: Secondary | ICD-10-CM | POA: Diagnosis not present

## 2019-02-03 DIAGNOSIS — N2581 Secondary hyperparathyroidism of renal origin: Secondary | ICD-10-CM | POA: Diagnosis not present

## 2019-02-03 DIAGNOSIS — N186 End stage renal disease: Secondary | ICD-10-CM | POA: Diagnosis not present

## 2019-02-03 DIAGNOSIS — E8779 Other fluid overload: Secondary | ICD-10-CM | POA: Diagnosis not present

## 2019-02-06 DIAGNOSIS — E8779 Other fluid overload: Secondary | ICD-10-CM | POA: Diagnosis not present

## 2019-02-06 DIAGNOSIS — N2581 Secondary hyperparathyroidism of renal origin: Secondary | ICD-10-CM | POA: Diagnosis not present

## 2019-02-06 DIAGNOSIS — D631 Anemia in chronic kidney disease: Secondary | ICD-10-CM | POA: Diagnosis not present

## 2019-02-06 DIAGNOSIS — N186 End stage renal disease: Secondary | ICD-10-CM | POA: Diagnosis not present

## 2019-02-06 DIAGNOSIS — E1129 Type 2 diabetes mellitus with other diabetic kidney complication: Secondary | ICD-10-CM | POA: Diagnosis not present

## 2019-02-07 ENCOUNTER — Emergency Department (HOSPITAL_COMMUNITY)
Admission: EM | Admit: 2019-02-07 | Discharge: 2019-02-07 | Disposition: A | Discharge status: Home / Self Care | Payer: Medicare Other | Attending: Emergency Medicine | Admitting: Emergency Medicine

## 2019-02-07 ENCOUNTER — Other Ambulatory Visit: Payer: Self-pay

## 2019-02-07 DIAGNOSIS — N186 End stage renal disease: Secondary | ICD-10-CM | POA: Insufficient documentation

## 2019-02-07 DIAGNOSIS — I132 Hypertensive heart and chronic kidney disease with heart failure and with stage 5 chronic kidney disease, or end stage renal disease: Secondary | ICD-10-CM | POA: Diagnosis not present

## 2019-02-07 DIAGNOSIS — I5022 Chronic systolic (congestive) heart failure: Secondary | ICD-10-CM | POA: Diagnosis not present

## 2019-02-07 DIAGNOSIS — Z87891 Personal history of nicotine dependence: Secondary | ICD-10-CM | POA: Diagnosis not present

## 2019-02-07 DIAGNOSIS — Z7982 Long term (current) use of aspirin: Secondary | ICD-10-CM | POA: Diagnosis not present

## 2019-02-07 DIAGNOSIS — I10 Essential (primary) hypertension: Secondary | ICD-10-CM | POA: Diagnosis not present

## 2019-02-07 DIAGNOSIS — E1165 Type 2 diabetes mellitus with hyperglycemia: Secondary | ICD-10-CM | POA: Insufficient documentation

## 2019-02-07 DIAGNOSIS — R5383 Other fatigue: Secondary | ICD-10-CM | POA: Insufficient documentation

## 2019-02-07 DIAGNOSIS — Z79899 Other long term (current) drug therapy: Secondary | ICD-10-CM | POA: Diagnosis not present

## 2019-02-07 DIAGNOSIS — Z794 Long term (current) use of insulin: Secondary | ICD-10-CM | POA: Insufficient documentation

## 2019-02-07 DIAGNOSIS — E1122 Type 2 diabetes mellitus with diabetic chronic kidney disease: Secondary | ICD-10-CM | POA: Insufficient documentation

## 2019-02-07 DIAGNOSIS — E1140 Type 2 diabetes mellitus with diabetic neuropathy, unspecified: Secondary | ICD-10-CM | POA: Diagnosis not present

## 2019-02-07 DIAGNOSIS — R52 Pain, unspecified: Secondary | ICD-10-CM | POA: Diagnosis not present

## 2019-02-07 DIAGNOSIS — R739 Hyperglycemia, unspecified: Secondary | ICD-10-CM

## 2019-02-07 DIAGNOSIS — Z992 Dependence on renal dialysis: Secondary | ICD-10-CM | POA: Diagnosis not present

## 2019-02-07 LAB — COMPREHENSIVE METABOLIC PANEL
ALT: 10 U/L (ref 0–44)
AST: 11 U/L — ABNORMAL LOW (ref 15–41)
Albumin: 3.6 g/dL (ref 3.5–5.0)
Alkaline Phosphatase: 102 U/L (ref 38–126)
Anion gap: 14 (ref 5–15)
BUN: 25 mg/dL — ABNORMAL HIGH (ref 6–20)
CO2: 30 mmol/L (ref 22–32)
Calcium: 9.5 mg/dL (ref 8.9–10.3)
Chloride: 91 mmol/L — ABNORMAL LOW (ref 98–111)
Creatinine, Ser: 8.15 mg/dL — ABNORMAL HIGH (ref 0.44–1.00)
GFR calc Af Amer: 6 mL/min — ABNORMAL LOW (ref >60)
GFR calc non Af Amer: 6 mL/min — ABNORMAL LOW (ref >60)
Glucose, Bld: 312 mg/dL — ABNORMAL HIGH (ref 70–99)
Potassium: 4.4 mmol/L (ref 3.5–5.1)
Sodium: 135 mmol/L (ref 135–145)
Total Bilirubin: 0.8 mg/dL (ref 0.3–1.2)
Total Protein: 7.9 g/dL (ref 6.5–8.1)

## 2019-02-07 LAB — CBC WITH DIFFERENTIAL/PLATELET
Abs Immature Granulocytes: 0.01 10*3/uL (ref 0.00–0.07)
Basophils Absolute: 0 10*3/uL (ref 0.0–0.1)
Basophils Relative: 0 %
Eosinophils Absolute: 0.1 10*3/uL (ref 0.0–0.5)
Eosinophils Relative: 2 %
HCT: 36.2 % (ref 36.0–46.0)
Hemoglobin: 11.1 g/dL — ABNORMAL LOW (ref 12.0–15.0)
Immature Granulocytes: 0 %
Lymphocytes Relative: 24 %
Lymphs Abs: 1.5 10*3/uL (ref 0.7–4.0)
MCH: 28.8 pg (ref 26.0–34.0)
MCHC: 30.7 g/dL (ref 30.0–36.0)
MCV: 93.8 fL (ref 80.0–100.0)
Monocytes Absolute: 0.7 10*3/uL (ref 0.1–1.0)
Monocytes Relative: 11 %
Neutro Abs: 3.8 10*3/uL (ref 1.7–7.7)
Neutrophils Relative %: 63 %
Platelets: 189 10*3/uL (ref 150–400)
RBC: 3.86 MIL/uL — ABNORMAL LOW (ref 3.87–5.11)
RDW: 15.1 % (ref 11.5–15.5)
WBC: 6.1 10*3/uL (ref 4.0–10.5)
nRBC: 0 % (ref 0.0–0.2)

## 2019-02-07 LAB — CBG MONITORING, ED: Glucose-Capillary: 348 mg/dL — ABNORMAL HIGH (ref 70–99)

## 2019-02-07 NOTE — Discharge Instructions (Signed)
It was my pleasure taking care of you today!   Keep a close eye on your sugars. Take your medications as directed.   Call your doctor today or tomorrow to schedule a follow up appointment.   Return to ER for new or worsening symptoms, any additional concerns.

## 2019-02-07 NOTE — Progress Notes (Signed)
Patient at this time no longer needs PIV per RN

## 2019-02-07 NOTE — ED Notes (Signed)
Face timing with friends, no acute needs noted.

## 2019-02-07 NOTE — ED Provider Notes (Signed)
[facility] EMERGENCY DEPARTMENT Provider Note   CSN: [id number] Arrival date & time: 02/07/19  1216    History   Chief Complaint No chief complaint on file.   HPI Karen [name] is a 43 y.o. female.     The history is provided by the patient and medical records. No language interpreter was used.   Karen [name] is a 43 y.o. female  with a PMH of ESRD on dialysis MWF, DM and other comordities as listed below who presents to the Emergency Department complaining of hyperglycemia and generalized fatigue.  Patient states that she checks her sugars about 4 times a day and typically is in the mid to high 100s.  She reports it was in the 140s or 150s last night.  She then ate a honey bun.  When she awoke this morning, she checked her sugar and it was 580s, prompting her to come to the emergency department today.  She states that sometimes she will make a little urine, but usually does not.  She denies fever, cough, shortness of breath, abdominal pain, nausea or vomiting.    Past Medical History:  Diagnosis Date  . Anemia   . Anxiety   . Dyspnea    "when I dont go to dialysis"  . Eczema   . ESRD (end stage renal disease) ([place])    Hemo - TTHSAT- [place]  . Exertional shortness of breath    "recently; it's fluid" (02/02/2013)  . GERD (gastroesophageal reflux disease)   . Headache   . History of blood transfusion    "last week" (02/02/2013)  . History of kidney stones    passed  . Hypertension   . Ischemic cardiomyopathy    by echo 2014  . Neuropathy   . Neuropathy   . Osteomyelitis of toe of left foot ([place])    "off and on since 2009; no OR" (02/02/2013)  . Pneumonia 09/2016  . Type II diabetes mellitus ([place]) 1995    Patient Active Problem List   Diagnosis Date Noted  . SBO (small bowel obstruction) ([place]) 07/08/2018  . Small bowel obstruction ([place]) 07/08/2018  . Weakness   . Acute on chronic systolic CHF (congestive heart failure) ([place])   . ESRD on  dialysis ([place])   . Homelessness   . Late effect of cerebrovascular accident (CVA)   . Dyspnea 01/27/2018  . Hypertension, uncontrolled 11/11/2017  . Fluid overload 11/11/2017  . Abdominal pain   . Dysarthria 09/14/2017  . Lacunar infarct, acute ([place]) 09/14/2017  . Obesity 09/09/2017  . Chest pain 09/07/2017  . Fluid overload, unspecified 10/03/2016  . Hyperkalemia 10/03/2016  . Hypertension with fluid overload 10/03/2016  . Anemia in other chronic diseases classified elsewhere 09/08/2016  . High anion gap metabolic acidosis 81/19/1478  . Right lower lobe pneumonia ([place]) 09/08/2016  . Uncontrolled type 2 diabetes mellitus with chronic kidney disease on chronic dialysis, with long-term current use of insulin ([place]) 01/31/2016  . Noncompliance with medications   . End-stage renal disease on hemodialysis ([place])   . Hx of BKA ([place]) 07/03/2013  . S/P BKA (below knee amputation) unilateral ([place]) 06/30/2013  . Physical deconditioning 06/30/2013  . Cardiomyopathy, ischemic - EF 45-50% with inf WMA by 2D 02/05/13 02/06/2013  . Essential hypertension 02/05/2013  . PAD (peripheral artery disease) ([place]) 02/05/2013  . Diabetic neuropathy ([place])   . Osteomyelitis of left great toe - S/P amputation 02/06/13 04/21/2011    Past Surgical History:  Procedure Laterality Date  .  AMPUTATION Left 02/06/2013   Procedure: AMPUTATION LEFT GREAT TOE;  Surgeon: Wylene Simmer, MD;  Location: [facility];  Service: Orthopedics;  Laterality: Left;  . AMPUTATION Left 06/24/2013   Procedure: AMPUTATION BELOW KNEE ;  Surgeon: Wylene Simmer, MD;  Location: [facility];  Service: Orthopedics;  Laterality: Left;  . AV FISTULA PLACEMENT Right 02/08/2017   Procedure: CREATION OF RIGHT ARM  BASILIC VEIN TO BRACHIAL ARTERY ARTERIOVENOUS (AV) FISTULA;  Surgeon: Rosetta Posner, MD;  Location: [facility];  Service: Vascular;  Laterality: Right;  . AV FISTULA PLACEMENT Left 09/10/2017   Procedure: INSERTION OF ARTERIOVENOUS (AV) GORE-TEX GRAFT  LEFT  UPPER ARM;  Surgeon: Angelia Mould, MD;  Location: [facility];  Service: Vascular;  Laterality: Left;  . BASCILIC VEIN TRANSPOSITION Right 04/14/2017   Procedure: BASCILIC VEIN TRANSPOSITION-RIGHT 2ND STAGE;  Surgeon: Rosetta Posner, MD;  Location: [facility];  Service: Vascular;  Laterality: Right;  . CESAREAN SECTION  10/18/2006  . INSERTION OF DIALYSIS CATHETER       OB History   No obstetric history on file.      Home Medications    Prior to Admission medications   Medication Sig Start Date End Date Taking? Authorizing Provider  amLODipine (NORVASC) 10 MG tablet Take 10 mg by mouth daily.   Yes [provider]  aspirin EC 81 MG tablet Take 81 mg by mouth daily.   Yes [provider]  atorvastatin (LIPITOR) 80 MG tablet TAKE 1 TABLET(80 MG) BY MOUTH DAILY AT 6 PM Patient taking differently: Take 80 mg by mouth daily at 6 PM.  09/16/17  Yes Lacroce, Hulen Shouts, MD  cinacalcet (SENSIPAR) 30 MG tablet Take 1 tablet (30 mg total) by mouth Every Tuesday,Thursday,and Saturday with dialysis. Patient taking differently: Take 30 mg by mouth every Monday, Wednesday, and Friday with hemodialysis.  11/13/17  Yes Debbe Odea, MD  insulin glargine (LANTUS) 100 UNIT/ML injection Inject 18 Units into the skin at bedtime. 01/14/16  Yes [provider]  metoprolol succinate (TOPROL-XL) 25 MG 24 hr tablet Take 0.5 tablets (12.5 mg total) by mouth daily. 07/10/18 02/07/19 Yes Bonnielee Haff, MD  ondansetron (ZOFRAN) 4 MG tablet Take 4 mg by mouth daily as needed for nausea or vomiting.   Yes [provider]  Oxycodone HCl 10 MG TABS Take 10 mg by mouth every 8 (eight) hours as needed (Pain).   Yes [provider]  sevelamer carbonate (RENVELA) 800 MG tablet Take 800 mg by mouth 3 (three) times daily with meals.   Yes [provider]  aspirin 325 MG tablet Take 1 tablet (325 mg total) by mouth daily. Patient not taking: Reported on 02/07/2019 09/16/17    Melanee Spry, MD  calcitRIOL (ROCALTROL) 0.5 MCG capsule Take 3 capsules (1.5 mcg total) by mouth Every Tuesday,Thursday,and Saturday with dialysis. Patient not taking: Reported on 07/09/2018 11/13/17   Debbe Odea, MD  Darbepoetin Alfa (ARANESP) 200 MCG/0.4ML SOSY injection Inject 0.4 mLs (200 mcg total) into the vein every Thursday with hemodialysis. Patient not taking: Reported on 07/09/2018 02/03/18   Bonnita Hollow, MD  ferric gluconate 62.5 mg in sodium chloride 0.9 % 100 mL Inject 62.5 mg into the vein every Thursday with hemodialysis. Patient not taking: Reported on 07/09/2018 02/03/18   Bonnita Hollow, MD  multivitamin (RENA-VIT) TABS tablet Take 1 tablet by mouth at bedtime. Patient not taking: Reported on 07/09/2018 11/13/17   Debbe Odea, MD  polyethylene glycol (MIRALAX / Floria Raveling) packet  Take 17 g by mouth daily. Patient not taking: Reported on 07/09/2018 02/02/18   Bonnita Hollow, MD    Family History Family History  Problem Relation Age of Onset  . Hypertension Mother   . Diabetes Mother   . Diabetes Father     Social History Social History   Tobacco Use  . Smoking status: Former Smoker    Packs/day: 3.00    Years: 10.00    Pack years: 30.00    Types: Cigarettes    Last attempt to quit: 09/21/2004    Years since quitting: 14.3  . Smokeless tobacco: Never Used  Substance Use Topics  . Alcohol use: No  . Drug use: No     Allergies   Baclofen; Penicillins; Morphine and related; and Novolog [insulin aspart]   Review of Systems Review of Systems  Constitutional: Positive for fatigue.  Endocrine:       +hyperglycemia  All other systems reviewed and are negative.    Physical Exam Updated Vital Signs BP (!) 185/97   Pulse 82   Temp 98 F (36.7 C) (Oral)   Resp (!) 24   Ht 5\' 6"  (1.676 m)   Wt 96 kg   SpO2 99%   BMI 34.16 kg/m   Physical Exam Vitals signs and nursing note reviewed.  Constitutional:      General: She is not in  acute distress.    Appearance: She is well-developed.  HENT:     Head: Normocephalic and atraumatic.  Neck:     Musculoskeletal: Neck supple.  Cardiovascular:     Rate and Rhythm: Normal rate and regular rhythm.     Heart sounds: Normal heart sounds. No murmur.  Pulmonary:     Effort: Pulmonary effort is normal. No respiratory distress.     Breath sounds: Normal breath sounds.  Abdominal:     General: There is no distension.     Palpations: Abdomen is soft.     Tenderness: There is no abdominal tenderness.  Musculoskeletal:     Comments: Left BKA  Skin:    General: Skin is warm and dry.  Neurological:     Mental Status: She is alert and oriented to person, place, and time.      ED Treatments / Results  Labs (all labs ordered are listed, but only abnormal results are displayed) Labs Reviewed  CBC WITH DIFFERENTIAL/PLATELET - Abnormal; Notable for the following components:      Result Value   RBC 3.86 (*)    Hemoglobin 11.1 (*)    All other components within normal limits  COMPREHENSIVE METABOLIC PANEL - Abnormal; Notable for the following components:   Chloride 91 (*)    Glucose, Bld 312 (*)    BUN 25 (*)    Creatinine, Ser 8.15 (*)    AST 11 (*)    GFR calc non Af Amer 6 (*)    GFR calc Af Amer 6 (*)    All other components within normal limits  CBG MONITORING, ED - Abnormal; Notable for the following components:   Glucose-Capillary 348 (*)    All other components within normal limits    EKG None  Radiology No results found.  Procedures Procedures (including critical care time)  Medications Ordered in ED Medications - No data to display   Initial Impression / Assessment and Plan / ED Course  I have reviewed the triage vital signs and the nursing notes.  Pertinent labs & imaging results that were available during my  care of the patient were reviewed by me and considered in my medical decision making (see chart for details).       Karen [name] is a  43 y.o. female who presents to ED for hyperglycemia.  She states that she ate a honey bun last night.  She woke up this morning, her blood sugars were in the 500s.  She is afebrile and hemodynamically stable.  She denies any infectious symptoms.  She is nontoxic-appearing with benign abdominal exam.  Labs show hyperglycemia without evidence of DKA.  On reevaluation, she states that she is hungry and feels better.  She tells me that she has been eating a lot of candy and other treats as well.  We discussed the importance of adherence to her diabetic diet.  Recommended that she call her PCP today or tomorrow to schedule follow-up.  Reasons to return to the emergency department were discussed and all questions were answered.   Final Clinical Impressions(s) / ED Diagnoses   Final diagnoses:  Hyperglycemia    ED Discharge Orders    None       Point, Ozella Almond, PA-C 02/07/19 1526    Hayden Rasmussen, MD 02/07/19 1801

## 2019-02-07 NOTE — ED Triage Notes (Signed)
Pt has done dialysis M-W-F schedule with no missed appointments.  Today general malaise, bilateral leg pain and hyperglycemia.  Takes BS 4 x day. Lantus 18 units QHS. Denies any N/V/D/, no abd pain, SOB or CP.  Alert and oriented.  L BKA.

## 2019-02-07 NOTE — ED Notes (Signed)
IV team in with pt.  Pt jovial without s/sx.

## 2019-02-07 NOTE — ED Notes (Signed)
This RN acting as Art therapist and asked pt if she would like for me to reach out to family/friends for her. Pt declined and states she is able to at this time.

## 2019-02-07 NOTE — ED Notes (Addendum)
EMT Note: Pt CBG, 348. RN notified.

## 2019-02-10 DIAGNOSIS — D631 Anemia in chronic kidney disease: Secondary | ICD-10-CM | POA: Diagnosis not present

## 2019-02-10 DIAGNOSIS — E1129 Type 2 diabetes mellitus with other diabetic kidney complication: Secondary | ICD-10-CM | POA: Diagnosis not present

## 2019-02-10 DIAGNOSIS — E8779 Other fluid overload: Secondary | ICD-10-CM | POA: Diagnosis not present

## 2019-02-10 DIAGNOSIS — N186 End stage renal disease: Secondary | ICD-10-CM | POA: Diagnosis not present

## 2019-02-10 DIAGNOSIS — N2581 Secondary hyperparathyroidism of renal origin: Secondary | ICD-10-CM | POA: Diagnosis not present

## 2019-02-13 DIAGNOSIS — E1129 Type 2 diabetes mellitus with other diabetic kidney complication: Secondary | ICD-10-CM | POA: Diagnosis not present

## 2019-02-13 DIAGNOSIS — N2581 Secondary hyperparathyroidism of renal origin: Secondary | ICD-10-CM | POA: Diagnosis not present

## 2019-02-13 DIAGNOSIS — E8779 Other fluid overload: Secondary | ICD-10-CM | POA: Diagnosis not present

## 2019-02-13 DIAGNOSIS — D631 Anemia in chronic kidney disease: Secondary | ICD-10-CM | POA: Diagnosis not present

## 2019-02-13 DIAGNOSIS — N186 End stage renal disease: Secondary | ICD-10-CM | POA: Diagnosis not present

## 2019-02-14 DIAGNOSIS — N186 End stage renal disease: Secondary | ICD-10-CM | POA: Diagnosis not present

## 2019-02-14 DIAGNOSIS — D631 Anemia in chronic kidney disease: Secondary | ICD-10-CM | POA: Diagnosis not present

## 2019-02-14 DIAGNOSIS — N2581 Secondary hyperparathyroidism of renal origin: Secondary | ICD-10-CM | POA: Diagnosis not present

## 2019-02-14 DIAGNOSIS — E1129 Type 2 diabetes mellitus with other diabetic kidney complication: Secondary | ICD-10-CM | POA: Diagnosis not present

## 2019-02-14 DIAGNOSIS — E8779 Other fluid overload: Secondary | ICD-10-CM | POA: Diagnosis not present

## 2019-02-17 DIAGNOSIS — D631 Anemia in chronic kidney disease: Secondary | ICD-10-CM | POA: Diagnosis not present

## 2019-02-17 DIAGNOSIS — E1129 Type 2 diabetes mellitus with other diabetic kidney complication: Secondary | ICD-10-CM | POA: Diagnosis not present

## 2019-02-17 DIAGNOSIS — E8779 Other fluid overload: Secondary | ICD-10-CM | POA: Diagnosis not present

## 2019-02-17 DIAGNOSIS — N2581 Secondary hyperparathyroidism of renal origin: Secondary | ICD-10-CM | POA: Diagnosis not present

## 2019-02-17 DIAGNOSIS — N186 End stage renal disease: Secondary | ICD-10-CM | POA: Diagnosis not present

## 2019-02-19 DIAGNOSIS — E1165 Type 2 diabetes mellitus with hyperglycemia: Secondary | ICD-10-CM | POA: Diagnosis not present

## 2019-02-19 DIAGNOSIS — R000 Tachycardia, unspecified: Secondary | ICD-10-CM | POA: Diagnosis not present

## 2019-02-19 DIAGNOSIS — R069 Unspecified abnormalities of breathing: Secondary | ICD-10-CM | POA: Diagnosis not present

## 2019-02-19 DIAGNOSIS — R0602 Shortness of breath: Secondary | ICD-10-CM | POA: Diagnosis not present

## 2019-02-19 DIAGNOSIS — I10 Essential (primary) hypertension: Secondary | ICD-10-CM | POA: Diagnosis not present

## 2019-02-19 DIAGNOSIS — R0902 Hypoxemia: Secondary | ICD-10-CM | POA: Diagnosis not present

## 2019-02-19 DIAGNOSIS — J811 Chronic pulmonary edema: Secondary | ICD-10-CM | POA: Diagnosis not present

## 2019-02-19 DIAGNOSIS — Z20828 Contact with and (suspected) exposure to other viral communicable diseases: Secondary | ICD-10-CM | POA: Diagnosis not present

## 2019-02-20 ENCOUNTER — Emergency Department (HOSPITAL_COMMUNITY): Payer: Medicare Other

## 2019-02-20 ENCOUNTER — Non-Acute Institutional Stay (HOSPITAL_COMMUNITY)
Admission: EM | Admit: 2019-02-20 | Discharge: 2019-02-20 | Disposition: A | Discharge status: Home / Self Care | Payer: Medicare Other | Attending: Emergency Medicine | Admitting: Emergency Medicine

## 2019-02-20 ENCOUNTER — Encounter (HOSPITAL_COMMUNITY): Payer: Self-pay | Admitting: Emergency Medicine

## 2019-02-20 DIAGNOSIS — D631 Anemia in chronic kidney disease: Secondary | ICD-10-CM | POA: Diagnosis not present

## 2019-02-20 DIAGNOSIS — I132 Hypertensive heart and chronic kidney disease with heart failure and with stage 5 chronic kidney disease, or end stage renal disease: Secondary | ICD-10-CM | POA: Insufficient documentation

## 2019-02-20 DIAGNOSIS — Z87891 Personal history of nicotine dependence: Secondary | ICD-10-CM | POA: Diagnosis not present

## 2019-02-20 DIAGNOSIS — J811 Chronic pulmonary edema: Secondary | ICD-10-CM | POA: Diagnosis present

## 2019-02-20 DIAGNOSIS — F419 Anxiety disorder, unspecified: Secondary | ICD-10-CM | POA: Insufficient documentation

## 2019-02-20 DIAGNOSIS — Z992 Dependence on renal dialysis: Secondary | ICD-10-CM | POA: Insufficient documentation

## 2019-02-20 DIAGNOSIS — Z89512 Acquired absence of left leg below knee: Secondary | ICD-10-CM | POA: Insufficient documentation

## 2019-02-20 DIAGNOSIS — Z7982 Long term (current) use of aspirin: Secondary | ICD-10-CM | POA: Insufficient documentation

## 2019-02-20 DIAGNOSIS — K219 Gastro-esophageal reflux disease without esophagitis: Secondary | ICD-10-CM | POA: Insufficient documentation

## 2019-02-20 DIAGNOSIS — Z885 Allergy status to narcotic agent status: Secondary | ICD-10-CM | POA: Diagnosis not present

## 2019-02-20 DIAGNOSIS — Z888 Allergy status to other drugs, medicaments and biological substances status: Secondary | ICD-10-CM | POA: Diagnosis not present

## 2019-02-20 DIAGNOSIS — Z833 Family history of diabetes mellitus: Secondary | ICD-10-CM | POA: Diagnosis not present

## 2019-02-20 DIAGNOSIS — I255 Ischemic cardiomyopathy: Secondary | ICD-10-CM | POA: Diagnosis not present

## 2019-02-20 DIAGNOSIS — Z880 Allergy status to penicillin: Secondary | ICD-10-CM | POA: Diagnosis not present

## 2019-02-20 DIAGNOSIS — I509 Heart failure, unspecified: Secondary | ICD-10-CM | POA: Diagnosis not present

## 2019-02-20 DIAGNOSIS — R0602 Shortness of breath: Secondary | ICD-10-CM | POA: Diagnosis not present

## 2019-02-20 DIAGNOSIS — J810 Acute pulmonary edema: Secondary | ICD-10-CM

## 2019-02-20 DIAGNOSIS — E1140 Type 2 diabetes mellitus with diabetic neuropathy, unspecified: Secondary | ICD-10-CM | POA: Insufficient documentation

## 2019-02-20 DIAGNOSIS — Z79899 Other long term (current) drug therapy: Secondary | ICD-10-CM | POA: Diagnosis not present

## 2019-02-20 DIAGNOSIS — Z794 Long term (current) use of insulin: Secondary | ICD-10-CM | POA: Diagnosis not present

## 2019-02-20 DIAGNOSIS — Z20828 Contact with and (suspected) exposure to other viral communicable diseases: Secondary | ICD-10-CM | POA: Diagnosis not present

## 2019-02-20 DIAGNOSIS — E1122 Type 2 diabetes mellitus with diabetic chronic kidney disease: Secondary | ICD-10-CM | POA: Diagnosis not present

## 2019-02-20 DIAGNOSIS — Z8249 Family history of ischemic heart disease and other diseases of the circulatory system: Secondary | ICD-10-CM | POA: Diagnosis not present

## 2019-02-20 DIAGNOSIS — N186 End stage renal disease: Secondary | ICD-10-CM | POA: Diagnosis not present

## 2019-02-20 DIAGNOSIS — I10 Essential (primary) hypertension: Secondary | ICD-10-CM | POA: Diagnosis not present

## 2019-02-20 DIAGNOSIS — I120 Hypertensive chronic kidney disease with stage 5 chronic kidney disease or end stage renal disease: Secondary | ICD-10-CM | POA: Diagnosis not present

## 2019-02-20 LAB — BASIC METABOLIC PANEL
Anion gap: 18 — ABNORMAL HIGH (ref 5–15)
BUN: 54 mg/dL — ABNORMAL HIGH (ref 6–20)
CO2: 23 mmol/L (ref 22–32)
Calcium: 9.1 mg/dL (ref 8.9–10.3)
Chloride: 94 mmol/L — ABNORMAL LOW (ref 98–111)
Creatinine, Ser: 11.54 mg/dL — ABNORMAL HIGH (ref 0.44–1.00)
GFR calc Af Amer: 4 mL/min — ABNORMAL LOW (ref >60)
GFR calc non Af Amer: 4 mL/min — ABNORMAL LOW (ref >60)
Glucose, Bld: 276 mg/dL — ABNORMAL HIGH (ref 70–99)
Potassium: 5 mmol/L (ref 3.5–5.1)
Sodium: 135 mmol/L (ref 135–145)

## 2019-02-20 LAB — SARS CORONAVIRUS 2 BY RT PCR (HOSPITAL ORDER, PERFORMED IN ~~LOC~~ HOSPITAL LAB): SARS Coronavirus 2: NEGATIVE

## 2019-02-20 LAB — CBC
HCT: 32 % — ABNORMAL LOW (ref 36.0–46.0)
Hemoglobin: 9.8 g/dL — ABNORMAL LOW (ref 12.0–15.0)
MCH: 28.5 pg (ref 26.0–34.0)
MCHC: 30.6 g/dL (ref 30.0–36.0)
MCV: 93 fL (ref 80.0–100.0)
Platelets: 136 10*3/uL — ABNORMAL LOW (ref 150–400)
RBC: 3.44 MIL/uL — ABNORMAL LOW (ref 3.87–5.11)
RDW: 15 % (ref 11.5–15.5)
WBC: 7.4 10*3/uL (ref 4.0–10.5)
nRBC: 0 % (ref 0.0–0.2)

## 2019-02-20 LAB — TROPONIN I: Troponin I: 0.06 ng/mL (ref <0.03)

## 2019-02-20 LAB — I-STAT BETA HCG BLOOD, ED (MC, WL, AP ONLY): I-stat hCG, quantitative: <5 m[IU]/mL (ref <5)

## 2019-02-20 LAB — BRAIN NATRIURETIC PEPTIDE: B Natriuretic Peptide: 1183.9 pg/mL — ABNORMAL HIGH (ref 0.0–100.0)

## 2019-02-20 MED ORDER — LORAZEPAM 1 MG PO TABS
1.0000 mg | ORAL_TABLET | Freq: Once | ORAL | Status: AC
  Administered 2019-02-20: 1 mg via ORAL
  Filled 2019-02-20: qty 1

## 2019-02-20 MED ORDER — ACETAMINOPHEN 325 MG PO TABS
ORAL_TABLET | ORAL | Status: AC
  Filled 2019-02-20: qty 2

## 2019-02-20 MED ORDER — LORAZEPAM 2 MG/ML IJ SOLN
1.0000 mg | Freq: Once | INTRAMUSCULAR | Status: AC
  Administered 2019-02-20: 1 mg via INTRAVENOUS

## 2019-02-20 MED ORDER — HEPARIN SODIUM (PORCINE) 1000 UNIT/ML IJ SOLN
INTRAMUSCULAR | Status: AC
  Filled 2019-02-20: qty 1

## 2019-02-20 MED ORDER — PROMETHAZINE HCL 25 MG/ML IJ SOLN
25.0000 mg | Freq: Four times a day (QID) | INTRAMUSCULAR | Status: DC | PRN
  Administered 2019-02-20: 25 mg via INTRAVENOUS

## 2019-02-20 MED ORDER — SODIUM CHLORIDE 0.9% FLUSH: 3.0000 mL | Freq: Once | INTRAVENOUS | Status: DC

## 2019-02-20 MED ORDER — LORAZEPAM 2 MG/ML IJ SOLN
INTRAMUSCULAR | Status: AC
  Filled 2019-02-20: qty 1

## 2019-02-20 MED ORDER — CHLORHEXIDINE GLUCONATE CLOTH 2 % EX PADS: 6.0000 | MEDICATED_PAD | Freq: Every day | CUTANEOUS | Status: DC

## 2019-02-20 MED ORDER — PROMETHAZINE HCL 25 MG/ML IJ SOLN
INTRAMUSCULAR | Status: AC
  Filled 2019-02-20: qty 1

## 2019-02-20 MED ORDER — ACETAMINOPHEN 325 MG PO TABS
650.0000 mg | ORAL_TABLET | Freq: Once | ORAL | Status: AC
  Administered 2019-02-20: 650 mg via ORAL

## 2019-02-20 NOTE — ED Provider Notes (Signed)
TIME SEEN: 4:45 AM  CHIEF COMPLAINT: Shortness of breath  HPI: Patient is a 43 year old female with history of CHF with EF of 35 to 40% in 2018, end-stage renal disease on hemodialysis Monday, Wednesday and Friday who was last dialyzed on Friday, May 29, hypertension, diabetes who presents to the emergency department requesting dialysis.  States she feels short of breath.  States normally she is given something for anxiety and she will get dialysis and then go home.  No chest pain.  No fever or cough.  No lower extremity swelling or pain.  She is status post left BKA  Echo 08/2017:  Study Conclusions  - Left ventricle: The cavity size was normal. Systolic function was   moderately reduced. The estimated ejection fraction was in the   range of 35% to 40%. Diffuse hypokinesis. Doppler parameters are   consistent with a reversible restrictive pattern, indicative of   decreased left ventricular diastolic compliance and/or increased   left atrial pressure (grade 3 diastolic dysfunction). - Mitral valve: Moderately calcified annulus. Mildly thickened   leaflets . There was mild regurgitation. - Left atrium: The atrium was mildly dilated. Volume/bsa, ES,   (1-plane Simpson&'s, A2C): 32.9 ml/m^2. - Pulmonary arteries: Systolic pressure was mildly increased. PA   peak pressure: 32 mm Hg (S).  ROS: See HPI Constitutional: no fever  Eyes: no drainage  ENT: no runny nose   Cardiovascular:  no chest pain  Resp:  SOB  GI: no vomiting GU: no dysuria Integumentary: no rash  Allergy: no hives  Musculoskeletal: no leg swelling  Neurological: no slurred speech ROS otherwise negative  PAST MEDICAL HISTORY/PAST SURGICAL HISTORY:  Past Medical History:  Diagnosis Date  . Anemia   . Anxiety   . Dyspnea    "when I dont go to dialysis"  . Eczema   . ESRD (end stage renal disease) ([place])    Hemo - TTHSAT- [facility]  . Exertional shortness of breath    "recently; it's fluid" (02/02/2013)  . GERD  (gastroesophageal reflux disease)   . Headache   . History of blood transfusion    "last week" (02/02/2013)  . History of kidney stones    passed  . Hypertension   . Ischemic cardiomyopathy    by echo 2014  . Neuropathy   . Neuropathy   . Osteomyelitis of toe of left foot ([place])    "off and on since 2009; no OR" (02/02/2013)  . Pneumonia 09/2016  . Type II diabetes mellitus ([place]) 1995    MEDICATIONS:  Prior to Admission medications   Medication Sig Start Date End Date Taking? Authorizing Provider  amLODipine (NORVASC) 10 MG tablet Take 10 mg by mouth daily.    [provider]  aspirin 325 MG tablet Take 1 tablet (325 mg total) by mouth daily. Patient not taking: Reported on 02/07/2019 09/16/17   Melanee Spry, MD  aspirin EC 81 MG tablet Take 81 mg by mouth daily.    [provider]  atorvastatin (LIPITOR) 80 MG tablet TAKE 1 TABLET(80 MG) BY MOUTH DAILY AT 6 PM Patient taking differently: Take 80 mg by mouth daily at 6 PM.  09/16/17   Lacroce, Hulen Shouts, MD  calcitRIOL (ROCALTROL) 0.5 MCG capsule Take 3 capsules (1.5 mcg total) by mouth Every Tuesday,Thursday,and Saturday with dialysis. Patient not taking: Reported on 07/09/2018 11/13/17   Debbe Odea, MD  cinacalcet (SENSIPAR) 30 MG tablet Take 1 tablet (30 mg total) by mouth Every Tuesday,Thursday,and Saturday with dialysis. Patient  taking differently: Take 30 mg by mouth every Monday, Wednesday, and Friday with hemodialysis.  11/13/17   Debbe Odea, MD  Darbepoetin Alfa (ARANESP) 200 MCG/0.4ML SOSY injection Inject 0.4 mLs (200 mcg total) into the vein every Thursday with hemodialysis. Patient not taking: Reported on 07/09/2018 02/03/18   Bonnita Hollow, MD  ferric gluconate 62.5 mg in sodium chloride 0.9 % 100 mL Inject 62.5 mg into the vein every Thursday with hemodialysis. Patient not taking: Reported on 07/09/2018 02/03/18   Bonnita Hollow, MD  insulin glargine (LANTUS) 100 UNIT/ML injection Inject  18 Units into the skin at bedtime. 01/14/16   [provider]  metoprolol succinate (TOPROL-XL) 25 MG 24 hr tablet Take 0.5 tablets (12.5 mg total) by mouth daily. 07/10/18 02/07/19  Bonnielee Haff, MD  multivitamin (RENA-VIT) TABS tablet Take 1 tablet by mouth at bedtime. Patient not taking: Reported on 07/09/2018 11/13/17   Debbe Odea, MD  ondansetron (ZOFRAN) 4 MG tablet Take 4 mg by mouth daily as needed for nausea or vomiting.    [provider]  Oxycodone HCl 10 MG TABS Take 10 mg by mouth every 8 (eight) hours as needed (Pain).    [provider]  polyethylene glycol (MIRALAX / GLYCOLAX) packet Take 17 g by mouth daily. Patient not taking: Reported on 07/09/2018 02/02/18   Bonnita Hollow, MD  sevelamer carbonate (RENVELA) 800 MG tablet Take 800 mg by mouth 3 (three) times daily with meals.    [provider]    ALLERGIES:  Allergies  Allergen Reactions  . Baclofen Itching  . Penicillins Anaphylaxis, Hives, Rash and Other (See Comments)    PATIENT HAD A PCN REACTION WITH IMMEDIATE RASH, FACIAL/TONGUE/THROAT SWELLING, SOB, OR LIGHTHEADEDNESS WITH HYPOTENSION:  #  #  #  YES  #  #  #   SEVERE RASH INVOLVING MUCUS MEMBRANES or SKIN NECROSIS: #  #  #  YES  #  #  # Has patient had a PCN reaction that required hospitalization No Has patient had a PCN reaction occurring within the last 10 years: No If all of the above answers are "NO", then may proceed with Cephalosporin use.  09/10/16- tolerated Cefepime   . Morphine And Related Hives and Rash  . Novolog [Insulin Aspart] Other (See Comments)    Cramps/ Gi distress    SOCIAL HISTORY:  Social History   Tobacco Use  . Smoking status: Former Smoker    Packs/day: 3.00    Years: 10.00    Pack years: 30.00    Types: Cigarettes    Last attempt to quit: 09/21/2004    Years since quitting: 14.4  . Smokeless tobacco: Never Used  Substance Use Topics  . Alcohol use: No    FAMILY HISTORY: Family  History  Problem Relation Age of Onset  . Hypertension Mother   . Diabetes Mother   . Diabetes Father     EXAM: BP (!) 218/101 (BP Location: Right Arm)   Pulse 94   Temp 98.1 F (36.7 C) (Oral)   Resp 18   SpO2 98%  CONSTITUTIONAL: Alert and oriented and responds appropriately to questions.  Tonically ill-appearing HEAD: Normocephalic EYES: Conjunctivae clear, pupils appear equal, EOMI ENT: normal nose; moist mucous membranes NECK: Supple, no meningismus, no nuchal rigidity, no LAD  CARD: RRR; S1 and S2 appreciated; no murmurs, no clicks, no rubs, no gallops RESP: Patient is tachypneic with talking.  She has some bibasilar rales but no wheezing, rhonchi.  She is  not hypoxic or in respiratory distress.  No significant increased work of breathing. ABD/GI: Normal bowel sounds; non-distended; soft, non-tender, no rebound, no guarding, no peritoneal signs, no hepatosplenomegaly BACK:  The back appears normal and is non-tender to palpation, there is no CVA tenderness EXT: Normal ROM in all joints; non-tender to palpation; no edema; normal capillary refill; no cyanosis, no calf tenderness or swelling, patient is status post left BKA    SKIN: Normal color for age and race; warm, multiple scars noted to her extremities NEURO: Moves all extremities equally PSYCH: The patient's mood and manner are appropriate. Grooming and personal hygiene are appropriate.  MEDICAL DECISION MAKING: Patient here with hypertension, shortness of breath.  States that she needs dialysis and then can be discharged.  She does not want admission to the hospital at this time.  Also requesting something for anxiety.  Will give oral Ativan.  Chest x-ray obtained in triage shows cardiomegaly with pulmonary edema.  Labs pending.  Once labs have resulted, will discuss with nephrology on-call to see if we can get patient dialyzed this morning.  She does not feel that she can go home safely and be dialyzed at her dialysis center in  [place].  She states she feels too short of breath.  ED PROGRESS: Patient's labs show anemia which is chronic.  Potassium of 5.0.  Troponin slightly positive which is chronic for patient.  6:45 AM  Spoke with Dr. Jonnie Finner with nephrology.  He states they will be able to take the patient to dialysis this morning.  She will not need to be admitted to the hospital.  Patient comfortable with this plan.   I reviewed all nursing notes, vitals, pertinent previous records, EKGs, lab and urine results, imaging (as available).      EKG Interpretation  Date/Time:  Monday February 20 2019 00:37:33 EDT Ventricular Rate:  98 PR Interval:  158 QRS Duration: 94 QT Interval:  382 QTC Calculation: 487 R Axis:   71 Text Interpretation:  Normal sinus rhythm Cannot rule out Anterior infarct , age undetermined Abnormal ECG No significant change since last tracing Confirmed by Lumpkin, Cyril Mourning [phone]) on 02/20/2019 4:10:56 AM         Leys, Delice Bison, DO 02/20/19 [phone]

## 2019-02-20 NOTE — Procedures (Signed)
Patient seen on Hemodialysis. BP (!) 173/88   Pulse 99   Temp 98.1 F (36.7 C) (Oral)   Resp 18   SpO2 93%   QB 300, UF goal 4.5L Tolerating treatment without complaints at this time---reports that shortness of breath is improving with HD/UF.   Elmarie Shiley MD [facility]. Office # [phone] Pager # [phone] 8:39 AM

## 2019-02-20 NOTE — ED Notes (Signed)
Pt states she is a MWF dialysis pt and that she needs dialysis to resolve sx. Pt reports being from [place] where she usually receives dialysis.  Pt is also requesting something to help w/ anxiety.

## 2019-02-20 NOTE — ED Notes (Signed)
Unable to get blood in triage

## 2019-02-20 NOTE — Progress Notes (Signed)
Treatment complete goal met. Patient requested tylenol and ativan also gave Phenergan per MD request to help with nausea,Patient departed the unit alert oriented vitals stable w/o complaint. Slow to move to wheelchair prosthetic leg applied before placing in car.

## 2019-02-20 NOTE — ED Triage Notes (Signed)
Pt arrives from home with ems c/o of sob denies any CP. Pt due for dialysis in am.

## 2019-02-21 LAB — HEPATITIS B SURFACE ANTIBODY,QUALITATIVE: Hep B S Ab: REACTIVE

## 2019-02-21 LAB — HEPATITIS B CORE ANTIBODY, TOTAL: Hep B Core Total Ab: NEGATIVE

## 2019-02-21 LAB — HEPATITIS B SURFACE ANTIGEN: Hepatitis B Surface Ag: NEGATIVE

## 2019-02-22 DIAGNOSIS — N2581 Secondary hyperparathyroidism of renal origin: Secondary | ICD-10-CM | POA: Diagnosis not present

## 2019-02-22 DIAGNOSIS — N186 End stage renal disease: Secondary | ICD-10-CM | POA: Diagnosis not present

## 2019-02-22 DIAGNOSIS — D631 Anemia in chronic kidney disease: Secondary | ICD-10-CM | POA: Diagnosis not present

## 2019-02-22 DIAGNOSIS — E1129 Type 2 diabetes mellitus with other diabetic kidney complication: Secondary | ICD-10-CM | POA: Diagnosis not present

## 2019-02-24 DIAGNOSIS — D631 Anemia in chronic kidney disease: Secondary | ICD-10-CM | POA: Diagnosis not present

## 2019-02-24 DIAGNOSIS — N2581 Secondary hyperparathyroidism of renal origin: Secondary | ICD-10-CM | POA: Diagnosis not present

## 2019-02-24 DIAGNOSIS — E1129 Type 2 diabetes mellitus with other diabetic kidney complication: Secondary | ICD-10-CM | POA: Diagnosis not present

## 2019-02-24 DIAGNOSIS — N186 End stage renal disease: Secondary | ICD-10-CM | POA: Diagnosis not present

## 2019-02-27 DIAGNOSIS — N2581 Secondary hyperparathyroidism of renal origin: Secondary | ICD-10-CM | POA: Diagnosis not present

## 2019-02-27 DIAGNOSIS — N186 End stage renal disease: Secondary | ICD-10-CM | POA: Diagnosis not present

## 2019-02-27 DIAGNOSIS — D631 Anemia in chronic kidney disease: Secondary | ICD-10-CM | POA: Diagnosis not present

## 2019-02-27 DIAGNOSIS — E1129 Type 2 diabetes mellitus with other diabetic kidney complication: Secondary | ICD-10-CM | POA: Diagnosis not present

## 2019-02-28 DIAGNOSIS — D631 Anemia in chronic kidney disease: Secondary | ICD-10-CM | POA: Diagnosis not present

## 2019-02-28 DIAGNOSIS — N186 End stage renal disease: Secondary | ICD-10-CM | POA: Diagnosis not present

## 2019-02-28 DIAGNOSIS — E1129 Type 2 diabetes mellitus with other diabetic kidney complication: Secondary | ICD-10-CM | POA: Diagnosis not present

## 2019-02-28 DIAGNOSIS — N2581 Secondary hyperparathyroidism of renal origin: Secondary | ICD-10-CM | POA: Diagnosis not present

## 2019-03-02 DIAGNOSIS — E8770 Fluid overload, unspecified: Secondary | ICD-10-CM | POA: Diagnosis not present

## 2019-03-02 DIAGNOSIS — N186 End stage renal disease: Secondary | ICD-10-CM | POA: Diagnosis not present

## 2019-03-02 DIAGNOSIS — N2581 Secondary hyperparathyroidism of renal origin: Secondary | ICD-10-CM | POA: Diagnosis not present

## 2019-03-03 DIAGNOSIS — N2581 Secondary hyperparathyroidism of renal origin: Secondary | ICD-10-CM | POA: Diagnosis not present

## 2019-03-03 DIAGNOSIS — D631 Anemia in chronic kidney disease: Secondary | ICD-10-CM | POA: Diagnosis not present

## 2019-03-03 DIAGNOSIS — E1129 Type 2 diabetes mellitus with other diabetic kidney complication: Secondary | ICD-10-CM | POA: Diagnosis not present

## 2019-03-03 DIAGNOSIS — N186 End stage renal disease: Secondary | ICD-10-CM | POA: Diagnosis not present

## 2019-03-07 DIAGNOSIS — N2581 Secondary hyperparathyroidism of renal origin: Secondary | ICD-10-CM | POA: Diagnosis not present

## 2019-03-07 DIAGNOSIS — E1129 Type 2 diabetes mellitus with other diabetic kidney complication: Secondary | ICD-10-CM | POA: Diagnosis not present

## 2019-03-07 DIAGNOSIS — D631 Anemia in chronic kidney disease: Secondary | ICD-10-CM | POA: Diagnosis not present

## 2019-03-07 DIAGNOSIS — N186 End stage renal disease: Secondary | ICD-10-CM | POA: Diagnosis not present

## 2019-03-10 DIAGNOSIS — D631 Anemia in chronic kidney disease: Secondary | ICD-10-CM | POA: Diagnosis not present

## 2019-03-10 DIAGNOSIS — N186 End stage renal disease: Secondary | ICD-10-CM | POA: Diagnosis not present

## 2019-03-10 DIAGNOSIS — N2581 Secondary hyperparathyroidism of renal origin: Secondary | ICD-10-CM | POA: Diagnosis not present

## 2019-03-10 DIAGNOSIS — E1129 Type 2 diabetes mellitus with other diabetic kidney complication: Secondary | ICD-10-CM | POA: Diagnosis not present

## 2019-03-13 DIAGNOSIS — D631 Anemia in chronic kidney disease: Secondary | ICD-10-CM | POA: Diagnosis not present

## 2019-03-13 DIAGNOSIS — E1129 Type 2 diabetes mellitus with other diabetic kidney complication: Secondary | ICD-10-CM | POA: Diagnosis not present

## 2019-03-13 DIAGNOSIS — N2581 Secondary hyperparathyroidism of renal origin: Secondary | ICD-10-CM | POA: Diagnosis not present

## 2019-03-13 DIAGNOSIS — N186 End stage renal disease: Secondary | ICD-10-CM | POA: Diagnosis not present

## 2019-03-15 DIAGNOSIS — N186 End stage renal disease: Secondary | ICD-10-CM | POA: Diagnosis not present

## 2019-03-15 DIAGNOSIS — D631 Anemia in chronic kidney disease: Secondary | ICD-10-CM | POA: Diagnosis not present

## 2019-03-15 DIAGNOSIS — E1129 Type 2 diabetes mellitus with other diabetic kidney complication: Secondary | ICD-10-CM | POA: Diagnosis not present

## 2019-03-15 DIAGNOSIS — N2581 Secondary hyperparathyroidism of renal origin: Secondary | ICD-10-CM | POA: Diagnosis not present

## 2019-03-17 DIAGNOSIS — N2581 Secondary hyperparathyroidism of renal origin: Secondary | ICD-10-CM | POA: Diagnosis not present

## 2019-03-17 DIAGNOSIS — N186 End stage renal disease: Secondary | ICD-10-CM | POA: Diagnosis not present

## 2019-03-17 DIAGNOSIS — E1129 Type 2 diabetes mellitus with other diabetic kidney complication: Secondary | ICD-10-CM | POA: Diagnosis not present

## 2019-03-17 DIAGNOSIS — D631 Anemia in chronic kidney disease: Secondary | ICD-10-CM | POA: Diagnosis not present

## 2019-03-21 DIAGNOSIS — N2581 Secondary hyperparathyroidism of renal origin: Secondary | ICD-10-CM | POA: Diagnosis not present

## 2019-03-21 DIAGNOSIS — D631 Anemia in chronic kidney disease: Secondary | ICD-10-CM | POA: Diagnosis not present

## 2019-03-21 DIAGNOSIS — E1129 Type 2 diabetes mellitus with other diabetic kidney complication: Secondary | ICD-10-CM | POA: Diagnosis not present

## 2019-03-21 DIAGNOSIS — N186 End stage renal disease: Secondary | ICD-10-CM | POA: Diagnosis not present

## 2019-03-22 DIAGNOSIS — Z992 Dependence on renal dialysis: Secondary | ICD-10-CM | POA: Diagnosis not present

## 2019-03-22 DIAGNOSIS — N186 End stage renal disease: Secondary | ICD-10-CM | POA: Diagnosis not present

## 2019-03-22 DIAGNOSIS — E1122 Type 2 diabetes mellitus with diabetic chronic kidney disease: Secondary | ICD-10-CM | POA: Diagnosis not present

## 2019-03-23 DIAGNOSIS — D509 Iron deficiency anemia, unspecified: Secondary | ICD-10-CM | POA: Diagnosis not present

## 2019-03-23 DIAGNOSIS — D631 Anemia in chronic kidney disease: Secondary | ICD-10-CM | POA: Diagnosis not present

## 2019-03-23 DIAGNOSIS — N186 End stage renal disease: Secondary | ICD-10-CM | POA: Diagnosis not present

## 2019-03-23 DIAGNOSIS — N2581 Secondary hyperparathyroidism of renal origin: Secondary | ICD-10-CM | POA: Diagnosis not present

## 2019-03-23 DIAGNOSIS — E1129 Type 2 diabetes mellitus with other diabetic kidney complication: Secondary | ICD-10-CM | POA: Diagnosis not present

## 2019-03-24 DIAGNOSIS — D509 Iron deficiency anemia, unspecified: Secondary | ICD-10-CM | POA: Diagnosis not present

## 2019-03-24 DIAGNOSIS — D631 Anemia in chronic kidney disease: Secondary | ICD-10-CM | POA: Diagnosis not present

## 2019-03-24 DIAGNOSIS — E1129 Type 2 diabetes mellitus with other diabetic kidney complication: Secondary | ICD-10-CM | POA: Diagnosis not present

## 2019-03-24 DIAGNOSIS — N2581 Secondary hyperparathyroidism of renal origin: Secondary | ICD-10-CM | POA: Diagnosis not present

## 2019-03-24 DIAGNOSIS — N186 End stage renal disease: Secondary | ICD-10-CM | POA: Diagnosis not present

## 2019-03-27 DIAGNOSIS — N186 End stage renal disease: Secondary | ICD-10-CM | POA: Diagnosis not present

## 2019-03-27 DIAGNOSIS — E1129 Type 2 diabetes mellitus with other diabetic kidney complication: Secondary | ICD-10-CM | POA: Diagnosis not present

## 2019-03-27 DIAGNOSIS — D631 Anemia in chronic kidney disease: Secondary | ICD-10-CM | POA: Diagnosis not present

## 2019-03-27 DIAGNOSIS — N2581 Secondary hyperparathyroidism of renal origin: Secondary | ICD-10-CM | POA: Diagnosis not present

## 2019-03-27 DIAGNOSIS — D509 Iron deficiency anemia, unspecified: Secondary | ICD-10-CM | POA: Diagnosis not present

## 2019-03-29 DIAGNOSIS — E1129 Type 2 diabetes mellitus with other diabetic kidney complication: Secondary | ICD-10-CM | POA: Diagnosis not present

## 2019-03-29 DIAGNOSIS — D631 Anemia in chronic kidney disease: Secondary | ICD-10-CM | POA: Diagnosis not present

## 2019-03-29 DIAGNOSIS — N2581 Secondary hyperparathyroidism of renal origin: Secondary | ICD-10-CM | POA: Diagnosis not present

## 2019-03-29 DIAGNOSIS — N186 End stage renal disease: Secondary | ICD-10-CM | POA: Diagnosis not present

## 2019-03-29 DIAGNOSIS — D509 Iron deficiency anemia, unspecified: Secondary | ICD-10-CM | POA: Diagnosis not present

## 2019-03-31 DIAGNOSIS — D509 Iron deficiency anemia, unspecified: Secondary | ICD-10-CM | POA: Diagnosis not present

## 2019-03-31 DIAGNOSIS — N2581 Secondary hyperparathyroidism of renal origin: Secondary | ICD-10-CM | POA: Diagnosis not present

## 2019-03-31 DIAGNOSIS — D631 Anemia in chronic kidney disease: Secondary | ICD-10-CM | POA: Diagnosis not present

## 2019-03-31 DIAGNOSIS — N186 End stage renal disease: Secondary | ICD-10-CM | POA: Diagnosis not present

## 2019-03-31 DIAGNOSIS — E1129 Type 2 diabetes mellitus with other diabetic kidney complication: Secondary | ICD-10-CM | POA: Diagnosis not present

## 2019-04-02 ENCOUNTER — Emergency Department (HOSPITAL_COMMUNITY): Payer: Medicare Other

## 2019-04-02 ENCOUNTER — Encounter (HOSPITAL_COMMUNITY): Payer: Self-pay | Admitting: Emergency Medicine

## 2019-04-02 ENCOUNTER — Emergency Department (HOSPITAL_COMMUNITY)
Admission: EM | Admit: 2019-04-02 | Discharge: 2019-04-03 | Disposition: A | Discharge status: Home / Self Care | Payer: Medicare Other | Attending: Emergency Medicine | Admitting: Emergency Medicine

## 2019-04-02 ENCOUNTER — Other Ambulatory Visit: Payer: Self-pay

## 2019-04-02 DIAGNOSIS — Z87891 Personal history of nicotine dependence: Secondary | ICD-10-CM | POA: Insufficient documentation

## 2019-04-02 DIAGNOSIS — I5022 Chronic systolic (congestive) heart failure: Secondary | ICD-10-CM | POA: Diagnosis not present

## 2019-04-02 DIAGNOSIS — F419 Anxiety disorder, unspecified: Secondary | ICD-10-CM | POA: Diagnosis not present

## 2019-04-02 DIAGNOSIS — E1122 Type 2 diabetes mellitus with diabetic chronic kidney disease: Secondary | ICD-10-CM | POA: Diagnosis not present

## 2019-04-02 DIAGNOSIS — I132 Hypertensive heart and chronic kidney disease with heart failure and with stage 5 chronic kidney disease, or end stage renal disease: Secondary | ICD-10-CM | POA: Insufficient documentation

## 2019-04-02 DIAGNOSIS — E1140 Type 2 diabetes mellitus with diabetic neuropathy, unspecified: Secondary | ICD-10-CM | POA: Diagnosis not present

## 2019-04-02 DIAGNOSIS — Z89512 Acquired absence of left leg below knee: Secondary | ICD-10-CM | POA: Insufficient documentation

## 2019-04-02 DIAGNOSIS — Z794 Long term (current) use of insulin: Secondary | ICD-10-CM | POA: Diagnosis not present

## 2019-04-02 DIAGNOSIS — Z992 Dependence on renal dialysis: Secondary | ICD-10-CM | POA: Diagnosis not present

## 2019-04-02 DIAGNOSIS — N186 End stage renal disease: Secondary | ICD-10-CM | POA: Insufficient documentation

## 2019-04-02 DIAGNOSIS — R609 Edema, unspecified: Secondary | ICD-10-CM | POA: Diagnosis not present

## 2019-04-02 DIAGNOSIS — R0602 Shortness of breath: Secondary | ICD-10-CM | POA: Insufficient documentation

## 2019-04-02 DIAGNOSIS — I10 Essential (primary) hypertension: Secondary | ICD-10-CM | POA: Diagnosis not present

## 2019-04-02 DIAGNOSIS — R52 Pain, unspecified: Secondary | ICD-10-CM | POA: Diagnosis not present

## 2019-04-02 LAB — CBC WITH DIFFERENTIAL/PLATELET
Abs Immature Granulocytes: 0.02 10*3/uL (ref 0.00–0.07)
Basophils Absolute: 0 10*3/uL (ref 0.0–0.1)
Basophils Relative: 1 %
Eosinophils Absolute: 0.2 10*3/uL (ref 0.0–0.5)
Eosinophils Relative: 3 %
HCT: 32.3 % — ABNORMAL LOW (ref 36.0–46.0)
Hemoglobin: 10 g/dL — ABNORMAL LOW (ref 12.0–15.0)
Immature Granulocytes: 0 %
Lymphocytes Relative: 23 %
Lymphs Abs: 1.5 10*3/uL (ref 0.7–4.0)
MCH: 28.8 pg (ref 26.0–34.0)
MCHC: 31 g/dL (ref 30.0–36.0)
MCV: 93.1 fL (ref 80.0–100.0)
Monocytes Absolute: 0.6 10*3/uL (ref 0.1–1.0)
Monocytes Relative: 10 %
Neutro Abs: 4.1 10*3/uL (ref 1.7–7.7)
Neutrophils Relative %: 63 %
Platelets: 173 10*3/uL (ref 150–400)
RBC: 3.47 MIL/uL — ABNORMAL LOW (ref 3.87–5.11)
RDW: 15 % (ref 11.5–15.5)
WBC: 6.4 10*3/uL (ref 4.0–10.5)
nRBC: 0 % (ref 0.0–0.2)

## 2019-04-02 LAB — BASIC METABOLIC PANEL
Anion gap: 16 — ABNORMAL HIGH (ref 5–15)
BUN: 50 mg/dL — ABNORMAL HIGH (ref 6–20)
CO2: 26 mmol/L (ref 22–32)
Calcium: 9.3 mg/dL (ref 8.9–10.3)
Chloride: 93 mmol/L — ABNORMAL LOW (ref 98–111)
Creatinine, Ser: 10.46 mg/dL — ABNORMAL HIGH (ref 0.44–1.00)
GFR calc Af Amer: 5 mL/min — ABNORMAL LOW (ref >60)
GFR calc non Af Amer: 4 mL/min — ABNORMAL LOW (ref >60)
Glucose, Bld: 405 mg/dL — ABNORMAL HIGH (ref 70–99)
Potassium: 4.5 mmol/L (ref 3.5–5.1)
Sodium: 135 mmol/L (ref 135–145)

## 2019-04-02 MED ORDER — LORAZEPAM 1 MG PO TABS
1.0000 mg | ORAL_TABLET | Freq: Once | ORAL | Status: DC
  Filled 2019-04-02: qty 1

## 2019-04-02 MED ORDER — LORAZEPAM 1 MG PO TABS
1.0000 mg | ORAL_TABLET | Freq: Once | ORAL | Status: AC
  Administered 2019-04-02: 1 mg via ORAL
  Filled 2019-04-02: qty 1

## 2019-04-02 NOTE — Discharge Instructions (Addendum)
Go to dialysis in the morning as scheduled. Return here for any new/acute changes.

## 2019-04-02 NOTE — ED Notes (Signed)
[facility] called for transport.  

## 2019-04-02 NOTE — ED Notes (Signed)
X-ray at bedside

## 2019-04-02 NOTE — ED Provider Notes (Signed)
[facility] EMERGENCY DEPARTMENT Provider Note   CSN: [id number] Arrival date & time: 04/02/19  2134     History   Chief Complaint Chief Complaint  Patient presents with  . Shortness of Breath    HPI Karen [name] is a 43 y.o. female.     The history is provided by the patient and medical records.    43 year old female with history of anemia, anxiety, end-stage renal disease on hemodialysis MWF, CHF with EF 35-40%, HTN, DM2, presenting to the ED for shortness of breath.  States she has been feeling this way all weekend.  She did have a full dialysis treatment on Friday but still feels like she has excess fluid on her.  She denies any cough, fatigue, or fever.  No chest pain.  States at her HD facility she has been kept isolated from other patients due to risk of COVID, she has not had any known exposures.  She also states she feels anxious.  This is common for her when feeling short of breath and when she needs dialysis.  She does not use home O2.  Patient's vitals are stable here.  Past Medical History:  Diagnosis Date  . Anemia   . Anxiety   . Dyspnea    "when I dont go to dialysis"  . Eczema   . ESRD (end stage renal disease) ([place])    Hemo - [facility]- [facility]  . Exertional shortness of breath    "recently; it's fluid" (02/02/2013)  . GERD (gastroesophageal reflux disease)   . Headache   . History of blood transfusion    "last week" (02/02/2013)  . History of kidney stones    passed  . Hypertension   . Ischemic cardiomyopathy    by echo 2014  . Neuropathy   . Neuropathy   . Osteomyelitis of toe of left foot ([place])    "off and on since 2009; no OR" (02/02/2013)  . Pneumonia 09/2016  . Type II diabetes mellitus ([place]) 1995    Patient Active Problem List   Diagnosis Date Noted  . Pulmonary edema 02/20/2019  . SBO (small bowel obstruction) ([place]) 07/08/2018  . Small bowel obstruction ([place]) 07/08/2018  . Weakness   . Acute on chronic systolic CHF  (congestive heart failure) ([place])   . ESRD on dialysis ([place])   . Homelessness   . Late effect of cerebrovascular accident (CVA)   . Dyspnea 01/27/2018  . Hypertension, uncontrolled 11/11/2017  . Fluid overload 11/11/2017  . Abdominal pain   . Dysarthria 09/14/2017  . Lacunar infarct, acute ([place]) 09/14/2017  . Obesity 09/09/2017  . Chest pain 09/07/2017  . Fluid overload, unspecified 10/03/2016  . Hyperkalemia 10/03/2016  . Hypertension with fluid overload 10/03/2016  . Anemia in other chronic diseases classified elsewhere 09/08/2016  . High anion gap metabolic acidosis 65/99/3570  . Right lower lobe pneumonia ([place]) 09/08/2016  . Uncontrolled type 2 diabetes mellitus with chronic kidney disease on chronic dialysis, with long-term current use of insulin ([place]) 01/31/2016  . Noncompliance with medications   . End-stage renal disease on hemodialysis ([place])   . Hx of BKA ([place]) 07/03/2013  . S/P BKA (below knee amputation) unilateral ([place]) 06/30/2013  . Physical deconditioning 06/30/2013  . Cardiomyopathy, ischemic - EF 45-50% with inf WMA by 2D 02/05/13 02/06/2013  . Essential hypertension 02/05/2013  . PAD (peripheral artery disease) ([place]) 02/05/2013  . Diabetic neuropathy ([place])   . Osteomyelitis of left great toe - S/P amputation 02/06/13 04/21/2011  Past Surgical History:  Procedure Laterality Date  . AMPUTATION Left 02/06/2013   Procedure: AMPUTATION LEFT GREAT TOE;  Surgeon: Wylene Simmer, MD;  Location: [facility];  Service: Orthopedics;  Laterality: Left;  . AMPUTATION Left 06/24/2013   Procedure: AMPUTATION BELOW KNEE ;  Surgeon: Wylene Simmer, MD;  Location: [facility];  Service: Orthopedics;  Laterality: Left;  . AV FISTULA PLACEMENT Right 02/08/2017   Procedure: CREATION OF RIGHT ARM  BASILIC VEIN TO BRACHIAL ARTERY ARTERIOVENOUS (AV) FISTULA;  Surgeon: Rosetta Posner, MD;  Location: [facility];  Service: Vascular;  Laterality: Right;  . AV FISTULA PLACEMENT Left 09/10/2017   Procedure: INSERTION  OF ARTERIOVENOUS (AV) GORE-TEX GRAFT  LEFT UPPER ARM;  Surgeon: Angelia Mould, MD;  Location: [facility];  Service: Vascular;  Laterality: Left;  . BASCILIC VEIN TRANSPOSITION Right 04/14/2017   Procedure: BASCILIC VEIN TRANSPOSITION-RIGHT 2ND STAGE;  Surgeon: Rosetta Posner, MD;  Location: [facility];  Service: Vascular;  Laterality: Right;  . CESAREAN SECTION  10/18/2006  . INSERTION OF DIALYSIS CATHETER       OB History   No obstetric history on file.      Home Medications    Prior to Admission medications   Medication Sig Start Date End Date Taking? Authorizing Provider  amLODipine (NORVASC) 10 MG tablet Take 10 mg by mouth daily.    [provider]  aspirin 325 MG tablet Take 1 tablet (325 mg total) by mouth daily. Patient not taking: Reported on 02/07/2019 09/16/17   Melanee Spry, MD  aspirin EC 81 MG tablet Take 81 mg by mouth daily.    [provider]  atorvastatin (LIPITOR) 80 MG tablet TAKE 1 TABLET(80 MG) BY MOUTH DAILY AT 6 PM Patient taking differently: Take 80 mg by mouth daily at 6 PM.  09/16/17   Lacroce, Hulen Shouts, MD  calcitRIOL (ROCALTROL) 0.5 MCG capsule Take 3 capsules (1.5 mcg total) by mouth Every Tuesday,Thursday,and Saturday with dialysis. Patient not taking: Reported on 07/09/2018 11/13/17   Debbe Odea, MD  cinacalcet (SENSIPAR) 30 MG tablet Take 1 tablet (30 mg total) by mouth Every Tuesday,Thursday,and Saturday with dialysis. Patient taking differently: Take 30 mg by mouth every Monday, Wednesday, and Friday with hemodialysis.  11/13/17   Debbe Odea, MD  Darbepoetin Alfa (ARANESP) 200 MCG/0.4ML SOSY injection Inject 0.4 mLs (200 mcg total) into the vein every Thursday with hemodialysis. Patient not taking: Reported on 07/09/2018 02/03/18   Bonnita Hollow, MD  ferric gluconate 62.5 mg in sodium chloride 0.9 % 100 mL Inject 62.5 mg into the vein every Thursday with hemodialysis. Patient not taking: Reported on 07/09/2018 02/03/18    Bonnita Hollow, MD  insulin glargine (LANTUS) 100 UNIT/ML injection Inject 18 Units into the skin at bedtime. 01/14/16   [provider]  metoprolol succinate (TOPROL-XL) 25 MG 24 hr tablet Take 0.5 tablets (12.5 mg total) by mouth daily. 07/10/18 02/20/28  Bonnielee Haff, MD  multivitamin (RENA-VIT) TABS tablet Take 1 tablet by mouth at bedtime. Patient not taking: Reported on 07/09/2018 11/13/17   Debbe Odea, MD  ondansetron (ZOFRAN) 4 MG tablet Take 4 mg by mouth daily as needed for nausea or vomiting.    [provider]  Oxycodone HCl 10 MG TABS Take 10 mg by mouth every 8 (eight) hours as needed (Pain).    [provider]  polyethylene glycol (MIRALAX / GLYCOLAX) packet Take 17 g by mouth daily. Patient not taking: Reported on 07/09/2018 02/02/18  Bonnita Hollow, MD  sevelamer carbonate (RENVELA) 800 MG tablet Take 800 mg by mouth 3 (three) times daily with meals.    [provider]    Family History Family History  Problem Relation Age of Onset  . Hypertension Mother   . Diabetes Mother   . Diabetes Father     Social History Social History   Tobacco Use  . Smoking status: Former Smoker    Packs/day: 3.00    Years: 10.00    Pack years: 30.00    Types: Cigarettes    Quit date: 09/21/2004    Years since quitting: 14.5  . Smokeless tobacco: Never Used  Substance Use Topics  . Alcohol use: No  . Drug use: No     Allergies   Baclofen, Penicillins, Morphine and related, and Novolog [insulin aspart]   Review of Systems Review of Systems  Respiratory: Positive for shortness of breath.   All other systems reviewed and are negative.    Physical Exam Updated Vital Signs BP (!) 189/96 (BP Location: Right Arm)   Pulse 96   Temp 98.1 F (36.7 C) (Oral)   Resp (!) 23   SpO2 97%   Physical Exam Vitals signs and nursing note reviewed.  Constitutional:      Appearance: She is well-developed.  HENT:     Head: Normocephalic and  atraumatic.  Eyes:     Conjunctiva/sclera: Conjunctivae normal.     Pupils: Pupils are equal, round, and reactive to light.  Neck:     Musculoskeletal: Normal range of motion.  Cardiovascular:     Rate and Rhythm: Normal rate and regular rhythm.     Heart sounds: Normal heart sounds.  Pulmonary:     Effort: Pulmonary effort is normal.     Breath sounds: Normal breath sounds. No wheezing or rhonchi.  Abdominal:     General: Bowel sounds are normal.     Palpations: Abdomen is soft.  Musculoskeletal: Normal range of motion.     Comments: Left BKA  Skin:    General: Skin is warm and dry.     Comments: Multiple sores noted all over the extremities which appear chronic, signs of excoriation noted  Neurological:     Mental Status: She is alert and oriented to person, place, and time.      ED Treatments / Results  Labs (all labs ordered are listed, but only abnormal results are displayed) Labs Reviewed  CBC WITH DIFFERENTIAL/PLATELET - Abnormal; Notable for the following components:      Result Value   RBC 3.47 (*)    Hemoglobin 10.0 (*)    HCT 32.3 (*)    All other components within normal limits  BASIC METABOLIC PANEL - Abnormal; Notable for the following components:   Chloride 93 (*)    Glucose, Bld 405 (*)    BUN 50 (*)    Creatinine, Ser 10.46 (*)    GFR calc non Af Amer 4 (*)    GFR calc Af Amer 5 (*)    Anion gap 16 (*)    All other components within normal limits    EKG EKG Interpretation  Date/Time:  Sunday April 02 2019 22:44:06 EDT Ventricular Rate:  95 PR Interval:    QRS Duration: 110 QT Interval:  369 QTC Calculation: 464 R Axis:   46 Text Interpretation:  Sinus rhythm Consider left atrial enlargement new Minimal ST elevation, anterior leads Confirmed by Blanchie Dessert [phone]) on 04/02/2019 10:49:38 PM   Radiology  Dg Chest Port 1 View  Result Date: 04/02/2019 CLINICAL DATA:  Shortness of breath EXAM: PORTABLE CHEST 1 VIEW COMPARISON:  02/20/2019,  01/27/2018 FINDINGS: Cardiomegaly with central congestion. Possible small right pleural effusion. No pneumothorax. IMPRESSION: Cardiomegaly with vascular congestion and suspected small right pleural effusion Electronically Signed   By: Donavan Foil M.D.   On: 04/02/2019 23:01    Procedures Procedures (including critical care time)  Medications Ordered in ED Medications - No data to display   Initial Impression / Assessment and Plan / ED Course  I have reviewed the triage vital signs and the nursing notes.  Pertinent labs & imaging results that were available during my care of the patient were reviewed by me and considered in my medical decision making (see chart for details).  43 year old female here with shortness of breath.  She is end-stage renal disease on hemodialysis and due for treatment in the morning.  States she has felt short of breath all weekend.  She is afebrile and nontoxic in appearance here.  Her O2 sats are 100% during my assessment, even during fluid conversation.  She does not appear to be in any acute respiratory distress.  She does report feeling anxious as well which is reportedly normal for her when she feels this way.  We will plan for EKG, labs, chest x-ray.  She was offered Ativan for anxiety but declined.  Labs are reassuring.  CXR with some vascular congestion.  Patient continues maintaining normal oxygen saturation on room air.  She is sitting in wheelchair on side of bed and in no acute respiratory distress.  States now she would like the Ativan now so this was reordered for her.  At this point as she is not having any acute hypoxia or significant electrolyte derangements, I do not feel she requires admission as she does not appear to need emergent dialysis at this time.  She is stable to go to her OP dialysis treatment in the morning as scheduled.  Follow-up with PCP.  Return here for any new/acute changes.  Final Clinical Impressions(s) / ED Diagnoses   Final  diagnoses:  ESRD (end stage renal disease) on dialysis [facility])  Shortness of breath    ED Discharge Orders    None       Larene Pickett, PA-C 04/03/19 0126    Blanchie Dessert, MD 04/11/19 [phone]

## 2019-04-02 NOTE — ED Triage Notes (Signed)
Pt [facility] from home, c/o shortness of breath that started yesterday, worsening today. Dialysis M/W/F, pt reports a full treatment on Friday. Denies chest pain. EMS VS: SpO2 94% room air, BP 212/92, HR 90.

## 2019-04-03 DIAGNOSIS — R52 Pain, unspecified: Secondary | ICD-10-CM | POA: Diagnosis not present

## 2019-04-03 DIAGNOSIS — Z743 Need for continuous supervision: Secondary | ICD-10-CM | POA: Diagnosis not present

## 2019-04-03 DIAGNOSIS — I499 Cardiac arrhythmia, unspecified: Secondary | ICD-10-CM | POA: Diagnosis not present

## 2019-04-03 DIAGNOSIS — N186 End stage renal disease: Secondary | ICD-10-CM | POA: Diagnosis not present

## 2019-04-03 DIAGNOSIS — R279 Unspecified lack of coordination: Secondary | ICD-10-CM | POA: Diagnosis not present

## 2019-04-03 DIAGNOSIS — D631 Anemia in chronic kidney disease: Secondary | ICD-10-CM | POA: Diagnosis not present

## 2019-04-03 DIAGNOSIS — I469 Cardiac arrest, cause unspecified: Secondary | ICD-10-CM | POA: Diagnosis not present

## 2019-04-03 DIAGNOSIS — D509 Iron deficiency anemia, unspecified: Secondary | ICD-10-CM | POA: Diagnosis not present

## 2019-04-03 DIAGNOSIS — N2581 Secondary hyperparathyroidism of renal origin: Secondary | ICD-10-CM | POA: Diagnosis not present

## 2019-04-03 DIAGNOSIS — E1129 Type 2 diabetes mellitus with other diabetic kidney complication: Secondary | ICD-10-CM | POA: Diagnosis not present

## 2019-04-03 NOTE — ED Notes (Signed)
Patient verbalizes understanding of discharge instructions. Opportunity for questioning and answers were provided. Armband removed by staff, pt discharged from ED with PTAR.  

## 2019-04-06 DIAGNOSIS — N2581 Secondary hyperparathyroidism of renal origin: Secondary | ICD-10-CM | POA: Diagnosis not present

## 2019-04-06 DIAGNOSIS — E1129 Type 2 diabetes mellitus with other diabetic kidney complication: Secondary | ICD-10-CM | POA: Diagnosis not present

## 2019-04-06 DIAGNOSIS — D631 Anemia in chronic kidney disease: Secondary | ICD-10-CM | POA: Diagnosis not present

## 2019-04-06 DIAGNOSIS — N186 End stage renal disease: Secondary | ICD-10-CM | POA: Diagnosis not present

## 2019-04-06 DIAGNOSIS — D509 Iron deficiency anemia, unspecified: Secondary | ICD-10-CM | POA: Diagnosis not present

## 2019-04-07 DIAGNOSIS — E1129 Type 2 diabetes mellitus with other diabetic kidney complication: Secondary | ICD-10-CM | POA: Diagnosis not present

## 2019-04-07 DIAGNOSIS — N2581 Secondary hyperparathyroidism of renal origin: Secondary | ICD-10-CM | POA: Diagnosis not present

## 2019-04-07 DIAGNOSIS — D509 Iron deficiency anemia, unspecified: Secondary | ICD-10-CM | POA: Diagnosis not present

## 2019-04-07 DIAGNOSIS — D631 Anemia in chronic kidney disease: Secondary | ICD-10-CM | POA: Diagnosis not present

## 2019-04-07 DIAGNOSIS — N186 End stage renal disease: Secondary | ICD-10-CM | POA: Diagnosis not present

## 2019-04-10 DIAGNOSIS — D631 Anemia in chronic kidney disease: Secondary | ICD-10-CM | POA: Diagnosis not present

## 2019-04-10 DIAGNOSIS — D509 Iron deficiency anemia, unspecified: Secondary | ICD-10-CM | POA: Diagnosis not present

## 2019-04-10 DIAGNOSIS — N186 End stage renal disease: Secondary | ICD-10-CM | POA: Diagnosis not present

## 2019-04-10 DIAGNOSIS — N2581 Secondary hyperparathyroidism of renal origin: Secondary | ICD-10-CM | POA: Diagnosis not present

## 2019-04-10 DIAGNOSIS — E1129 Type 2 diabetes mellitus with other diabetic kidney complication: Secondary | ICD-10-CM | POA: Diagnosis not present

## 2019-04-12 DIAGNOSIS — D631 Anemia in chronic kidney disease: Secondary | ICD-10-CM | POA: Diagnosis not present

## 2019-04-12 DIAGNOSIS — N186 End stage renal disease: Secondary | ICD-10-CM | POA: Diagnosis not present

## 2019-04-12 DIAGNOSIS — D509 Iron deficiency anemia, unspecified: Secondary | ICD-10-CM | POA: Diagnosis not present

## 2019-04-12 DIAGNOSIS — E1129 Type 2 diabetes mellitus with other diabetic kidney complication: Secondary | ICD-10-CM | POA: Diagnosis not present

## 2019-04-12 DIAGNOSIS — N2581 Secondary hyperparathyroidism of renal origin: Secondary | ICD-10-CM | POA: Diagnosis not present

## 2019-04-14 DIAGNOSIS — N2581 Secondary hyperparathyroidism of renal origin: Secondary | ICD-10-CM | POA: Diagnosis not present

## 2019-04-14 DIAGNOSIS — D509 Iron deficiency anemia, unspecified: Secondary | ICD-10-CM | POA: Diagnosis not present

## 2019-04-14 DIAGNOSIS — E1129 Type 2 diabetes mellitus with other diabetic kidney complication: Secondary | ICD-10-CM | POA: Diagnosis not present

## 2019-04-14 DIAGNOSIS — N186 End stage renal disease: Secondary | ICD-10-CM | POA: Diagnosis not present

## 2019-04-14 DIAGNOSIS — D631 Anemia in chronic kidney disease: Secondary | ICD-10-CM | POA: Diagnosis not present

## 2019-04-18 DIAGNOSIS — N2581 Secondary hyperparathyroidism of renal origin: Secondary | ICD-10-CM | POA: Diagnosis not present

## 2019-04-18 DIAGNOSIS — D631 Anemia in chronic kidney disease: Secondary | ICD-10-CM | POA: Diagnosis not present

## 2019-04-18 DIAGNOSIS — E1129 Type 2 diabetes mellitus with other diabetic kidney complication: Secondary | ICD-10-CM | POA: Diagnosis not present

## 2019-04-18 DIAGNOSIS — D509 Iron deficiency anemia, unspecified: Secondary | ICD-10-CM | POA: Diagnosis not present

## 2019-04-18 DIAGNOSIS — N186 End stage renal disease: Secondary | ICD-10-CM | POA: Diagnosis not present

## 2019-04-19 DIAGNOSIS — E1129 Type 2 diabetes mellitus with other diabetic kidney complication: Secondary | ICD-10-CM | POA: Diagnosis not present

## 2019-04-19 DIAGNOSIS — N2581 Secondary hyperparathyroidism of renal origin: Secondary | ICD-10-CM | POA: Diagnosis not present

## 2019-04-19 DIAGNOSIS — D509 Iron deficiency anemia, unspecified: Secondary | ICD-10-CM | POA: Diagnosis not present

## 2019-04-19 DIAGNOSIS — N186 End stage renal disease: Secondary | ICD-10-CM | POA: Diagnosis not present

## 2019-04-19 DIAGNOSIS — D631 Anemia in chronic kidney disease: Secondary | ICD-10-CM | POA: Diagnosis not present

## 2019-04-21 DIAGNOSIS — N186 End stage renal disease: Secondary | ICD-10-CM | POA: Diagnosis not present

## 2019-04-21 DIAGNOSIS — D509 Iron deficiency anemia, unspecified: Secondary | ICD-10-CM | POA: Diagnosis not present

## 2019-04-21 DIAGNOSIS — E1129 Type 2 diabetes mellitus with other diabetic kidney complication: Secondary | ICD-10-CM | POA: Diagnosis not present

## 2019-04-21 DIAGNOSIS — N2581 Secondary hyperparathyroidism of renal origin: Secondary | ICD-10-CM | POA: Diagnosis not present

## 2019-04-21 DIAGNOSIS — D631 Anemia in chronic kidney disease: Secondary | ICD-10-CM | POA: Diagnosis not present

## 2019-04-22 DIAGNOSIS — E1122 Type 2 diabetes mellitus with diabetic chronic kidney disease: Secondary | ICD-10-CM | POA: Diagnosis not present

## 2019-04-22 DIAGNOSIS — N186 End stage renal disease: Secondary | ICD-10-CM | POA: Diagnosis not present

## 2019-04-22 DIAGNOSIS — Z992 Dependence on renal dialysis: Secondary | ICD-10-CM | POA: Diagnosis not present

## 2019-04-24 DIAGNOSIS — E1129 Type 2 diabetes mellitus with other diabetic kidney complication: Secondary | ICD-10-CM | POA: Diagnosis not present

## 2019-04-24 DIAGNOSIS — N2581 Secondary hyperparathyroidism of renal origin: Secondary | ICD-10-CM | POA: Diagnosis not present

## 2019-04-24 DIAGNOSIS — Z992 Dependence on renal dialysis: Secondary | ICD-10-CM | POA: Diagnosis not present

## 2019-04-24 DIAGNOSIS — D631 Anemia in chronic kidney disease: Secondary | ICD-10-CM | POA: Diagnosis not present

## 2019-04-24 DIAGNOSIS — N186 End stage renal disease: Secondary | ICD-10-CM | POA: Diagnosis not present

## 2019-04-27 DIAGNOSIS — I10 Essential (primary) hypertension: Secondary | ICD-10-CM | POA: Diagnosis not present

## 2019-04-27 DIAGNOSIS — K0889 Other specified disorders of teeth and supporting structures: Secondary | ICD-10-CM | POA: Diagnosis not present

## 2019-04-27 DIAGNOSIS — R0689 Other abnormalities of breathing: Secondary | ICD-10-CM | POA: Diagnosis not present

## 2019-04-27 DIAGNOSIS — R52 Pain, unspecified: Secondary | ICD-10-CM | POA: Diagnosis not present

## 2019-04-28 DIAGNOSIS — Z992 Dependence on renal dialysis: Secondary | ICD-10-CM | POA: Diagnosis not present

## 2019-04-28 DIAGNOSIS — D631 Anemia in chronic kidney disease: Secondary | ICD-10-CM | POA: Diagnosis not present

## 2019-04-28 DIAGNOSIS — N186 End stage renal disease: Secondary | ICD-10-CM | POA: Diagnosis not present

## 2019-04-28 DIAGNOSIS — E1129 Type 2 diabetes mellitus with other diabetic kidney complication: Secondary | ICD-10-CM | POA: Diagnosis not present

## 2019-04-28 DIAGNOSIS — N2581 Secondary hyperparathyroidism of renal origin: Secondary | ICD-10-CM | POA: Diagnosis not present

## 2019-05-01 DIAGNOSIS — D631 Anemia in chronic kidney disease: Secondary | ICD-10-CM | POA: Diagnosis not present

## 2019-05-01 DIAGNOSIS — Z992 Dependence on renal dialysis: Secondary | ICD-10-CM | POA: Diagnosis not present

## 2019-05-01 DIAGNOSIS — E1129 Type 2 diabetes mellitus with other diabetic kidney complication: Secondary | ICD-10-CM | POA: Diagnosis not present

## 2019-05-01 DIAGNOSIS — N186 End stage renal disease: Secondary | ICD-10-CM | POA: Diagnosis not present

## 2019-05-01 DIAGNOSIS — N2581 Secondary hyperparathyroidism of renal origin: Secondary | ICD-10-CM | POA: Diagnosis not present

## 2019-05-03 DIAGNOSIS — D631 Anemia in chronic kidney disease: Secondary | ICD-10-CM | POA: Diagnosis not present

## 2019-05-03 DIAGNOSIS — E1129 Type 2 diabetes mellitus with other diabetic kidney complication: Secondary | ICD-10-CM | POA: Diagnosis not present

## 2019-05-03 DIAGNOSIS — Z992 Dependence on renal dialysis: Secondary | ICD-10-CM | POA: Diagnosis not present

## 2019-05-03 DIAGNOSIS — N2581 Secondary hyperparathyroidism of renal origin: Secondary | ICD-10-CM | POA: Diagnosis not present

## 2019-05-03 DIAGNOSIS — N186 End stage renal disease: Secondary | ICD-10-CM | POA: Diagnosis not present

## 2019-05-05 DIAGNOSIS — E1129 Type 2 diabetes mellitus with other diabetic kidney complication: Secondary | ICD-10-CM | POA: Diagnosis not present

## 2019-05-05 DIAGNOSIS — D631 Anemia in chronic kidney disease: Secondary | ICD-10-CM | POA: Diagnosis not present

## 2019-05-05 DIAGNOSIS — N2581 Secondary hyperparathyroidism of renal origin: Secondary | ICD-10-CM | POA: Diagnosis not present

## 2019-05-05 DIAGNOSIS — Z992 Dependence on renal dialysis: Secondary | ICD-10-CM | POA: Diagnosis not present

## 2019-05-05 DIAGNOSIS — N186 End stage renal disease: Secondary | ICD-10-CM | POA: Diagnosis not present

## 2019-05-08 DIAGNOSIS — N186 End stage renal disease: Secondary | ICD-10-CM | POA: Diagnosis not present

## 2019-05-08 DIAGNOSIS — D631 Anemia in chronic kidney disease: Secondary | ICD-10-CM | POA: Diagnosis not present

## 2019-05-08 DIAGNOSIS — Z992 Dependence on renal dialysis: Secondary | ICD-10-CM | POA: Diagnosis not present

## 2019-05-08 DIAGNOSIS — N2581 Secondary hyperparathyroidism of renal origin: Secondary | ICD-10-CM | POA: Diagnosis not present

## 2019-05-08 DIAGNOSIS — E1129 Type 2 diabetes mellitus with other diabetic kidney complication: Secondary | ICD-10-CM | POA: Diagnosis not present

## 2019-05-09 ENCOUNTER — Other Ambulatory Visit: Payer: Self-pay

## 2019-05-09 ENCOUNTER — Other Ambulatory Visit: Payer: Self-pay | Admitting: Podiatry

## 2019-05-09 ENCOUNTER — Ambulatory Visit (INDEPENDENT_AMBULATORY_CARE_PROVIDER_SITE_OTHER): Payer: Medicare Other | Admitting: Podiatry

## 2019-05-09 DIAGNOSIS — L97511 Non-pressure chronic ulcer of other part of right foot limited to breakdown of skin: Secondary | ICD-10-CM

## 2019-05-09 DIAGNOSIS — I739 Peripheral vascular disease, unspecified: Secondary | ICD-10-CM

## 2019-05-09 DIAGNOSIS — Z5329 Procedure and treatment not carried out because of patient's decision for other reasons: Secondary | ICD-10-CM

## 2019-05-09 NOTE — Progress Notes (Signed)
   Complete physical exam  Patient: Karen [name]   DOB: 07/11/1998   43 y.o. Female  MRN: [id number]  Subjective:    No chief complaint on file.   Karen [name] is a 43 y.o. female who presents today for a complete physical exam. She reports consuming a {diet types:17450} diet. {types:19826} She generally feels {DESC; WELL/FAIRLY WELL/POORLY:18703}. She reports sleeping {DESC; WELL/FAIRLY WELL/POORLY:18703}. She {does/does not:200015} have additional problems to discuss today.    Most recent fall risk assessment:    03/18/2022   10:42 AM  Fall Risk   Falls in the past year? 0  Number falls in past yr: 0  Injury with Fall? 0  Risk for fall due to : No Fall Risks  Follow up Falls evaluation completed     Most recent depression screenings:    03/18/2022   10:42 AM 02/06/2021   10:46 AM  PHQ 2/9 Scores  PHQ - 2 Score 0 0  PHQ- 9 Score 5     {VISON DENTAL STD PSA (Optional):27386}  {History (Optional):23778}  Patient Care Team: Jessup, Joy, NP as PCP - General (Nurse Practitioner)   Outpatient Medications Prior to Visit  Medication Sig   fluticasone (FLONASE) 50 MCG/ACT nasal spray Place 2 sprays into both nostrils in the morning and at bedtime. After 7 days, reduce to once daily.   norgestimate-ethinyl estradiol (SPRINTEC 28) 0.25-35 MG-MCG tablet Take 1 tablet by mouth daily.   Nystatin POWD Apply liberally to affected area 2 times per day   spironolactone (ALDACTONE) 100 MG tablet Take 1 tablet (100 mg total) by mouth daily.   No facility-administered medications prior to visit.    ROS        Objective:     There were no vitals taken for this visit. {Vitals History (Optional):23777}  Physical Exam   No results found for any visits on 04/23/22. {Show previous labs (optional):23779}    Assessment & Plan:    Routine Health Maintenance and Physical Exam  Immunization History  Administered Date(s) Administered   DTaP 09/24/1999, 11/20/1999,  01/29/2000, 10/14/2000, 04/29/2004   Hepatitis A 02/24/2008, 03/01/2009   Hepatitis B 07/12/1999, 08/19/1999, 01/29/2000   HiB (PRP-OMP) 09/24/1999, 11/20/1999, 01/29/2000, 10/14/2000   IPV 09/24/1999, 11/20/1999, 07/19/2000, 04/29/2004   Influenza,inj,Quad PF,6+ Mos 06/01/2014   Influenza-Unspecified 08/31/2012   MMR 07/19/2001, 04/29/2004   Meningococcal Polysaccharide 02/29/2012   Pneumococcal Conjugate-13 10/14/2000   Pneumococcal-Unspecified 01/29/2000, 04/13/2000   Tdap 02/29/2012   Varicella 07/19/2000, 02/24/2008    Health Maintenance  Topic Date Due   HIV Screening  Never done   Hepatitis C Screening  Never done   INFLUENZA VACCINE  04/21/2022   PAP-Cervical Cytology Screening  04/23/2022 (Originally 07/10/2020)   PAP SMEAR-Modifier  04/23/2022 (Originally 07/10/2020)   TETANUS/TDAP  04/23/2022 (Originally 02/28/2022)   HPV VACCINES  Discontinued   COVID-19 Vaccine  Discontinued    Discussed health benefits of physical activity, and encouraged her to engage in regular exercise appropriate for her age and condition.  Problem List Items Addressed This Visit   None Visit Diagnoses     Annual physical exam    -  Primary   Cervical cancer screening       Need for Tdap vaccination          No follow-ups on file.     Joy Jessup, NP   

## 2019-05-10 ENCOUNTER — Emergency Department (HOSPITAL_COMMUNITY)
Admission: EM | Admit: 2019-05-10 | Discharge: 2019-05-10 | Discharge status: Against Medical Advice | Payer: Medicare Other | Attending: Emergency Medicine | Admitting: Emergency Medicine

## 2019-05-10 ENCOUNTER — Encounter (HOSPITAL_COMMUNITY): Payer: Self-pay | Admitting: Emergency Medicine

## 2019-05-10 DIAGNOSIS — N2581 Secondary hyperparathyroidism of renal origin: Secondary | ICD-10-CM | POA: Diagnosis not present

## 2019-05-10 DIAGNOSIS — N186 End stage renal disease: Secondary | ICD-10-CM | POA: Diagnosis not present

## 2019-05-10 DIAGNOSIS — D631 Anemia in chronic kidney disease: Secondary | ICD-10-CM | POA: Diagnosis not present

## 2019-05-10 DIAGNOSIS — E1129 Type 2 diabetes mellitus with other diabetic kidney complication: Secondary | ICD-10-CM | POA: Diagnosis not present

## 2019-05-10 DIAGNOSIS — M79606 Pain in leg, unspecified: Secondary | ICD-10-CM | POA: Diagnosis present

## 2019-05-10 DIAGNOSIS — R58 Hemorrhage, not elsewhere classified: Secondary | ICD-10-CM | POA: Diagnosis not present

## 2019-05-10 DIAGNOSIS — Z992 Dependence on renal dialysis: Secondary | ICD-10-CM | POA: Diagnosis not present

## 2019-05-10 DIAGNOSIS — I10 Essential (primary) hypertension: Secondary | ICD-10-CM | POA: Diagnosis not present

## 2019-05-10 DIAGNOSIS — R5381 Other malaise: Secondary | ICD-10-CM | POA: Diagnosis not present

## 2019-05-10 DIAGNOSIS — Z5321 Procedure and treatment not carried out due to patient leaving prior to being seen by health care provider: Secondary | ICD-10-CM | POA: Diagnosis not present

## 2019-05-10 DIAGNOSIS — R52 Pain, unspecified: Secondary | ICD-10-CM | POA: Diagnosis not present

## 2019-05-10 NOTE — ED Triage Notes (Signed)
Pt here from dialysis with c/o bil leg pain , pt only got 48 mins of her treatment

## 2019-05-15 DIAGNOSIS — R5381 Other malaise: Secondary | ICD-10-CM | POA: Diagnosis not present

## 2019-05-15 DIAGNOSIS — Z992 Dependence on renal dialysis: Secondary | ICD-10-CM | POA: Diagnosis not present

## 2019-05-15 DIAGNOSIS — E1129 Type 2 diabetes mellitus with other diabetic kidney complication: Secondary | ICD-10-CM | POA: Diagnosis not present

## 2019-05-15 DIAGNOSIS — M79671 Pain in right foot: Secondary | ICD-10-CM | POA: Diagnosis not present

## 2019-05-15 DIAGNOSIS — D631 Anemia in chronic kidney disease: Secondary | ICD-10-CM | POA: Diagnosis not present

## 2019-05-15 DIAGNOSIS — N2581 Secondary hyperparathyroidism of renal origin: Secondary | ICD-10-CM | POA: Diagnosis not present

## 2019-05-15 DIAGNOSIS — N186 End stage renal disease: Secondary | ICD-10-CM | POA: Diagnosis not present

## 2019-05-15 DIAGNOSIS — M25571 Pain in right ankle and joints of right foot: Secondary | ICD-10-CM | POA: Diagnosis not present

## 2019-05-15 DIAGNOSIS — R52 Pain, unspecified: Secondary | ICD-10-CM | POA: Diagnosis not present

## 2019-05-15 DIAGNOSIS — M7989 Other specified soft tissue disorders: Secondary | ICD-10-CM | POA: Diagnosis not present

## 2019-05-15 DIAGNOSIS — R609 Edema, unspecified: Secondary | ICD-10-CM | POA: Diagnosis not present

## 2019-05-16 ENCOUNTER — Encounter (HOSPITAL_COMMUNITY): Payer: Medicare Other

## 2019-05-16 ENCOUNTER — Encounter: Payer: Medicare Other | Admitting: Vascular Surgery

## 2019-05-16 DIAGNOSIS — M25571 Pain in right ankle and joints of right foot: Secondary | ICD-10-CM | POA: Diagnosis not present

## 2019-05-16 DIAGNOSIS — Z743 Need for continuous supervision: Secondary | ICD-10-CM | POA: Diagnosis not present

## 2019-05-16 DIAGNOSIS — M7989 Other specified soft tissue disorders: Secondary | ICD-10-CM | POA: Diagnosis not present

## 2019-05-16 DIAGNOSIS — R531 Weakness: Secondary | ICD-10-CM | POA: Diagnosis not present

## 2019-05-16 DIAGNOSIS — R279 Unspecified lack of coordination: Secondary | ICD-10-CM | POA: Diagnosis not present

## 2019-05-16 DIAGNOSIS — R609 Edema, unspecified: Secondary | ICD-10-CM | POA: Diagnosis not present

## 2019-05-17 ENCOUNTER — Encounter: Payer: Self-pay | Admitting: Family

## 2019-05-19 DIAGNOSIS — N186 End stage renal disease: Secondary | ICD-10-CM | POA: Diagnosis not present

## 2019-05-19 DIAGNOSIS — N2581 Secondary hyperparathyroidism of renal origin: Secondary | ICD-10-CM | POA: Diagnosis not present

## 2019-05-19 DIAGNOSIS — E1129 Type 2 diabetes mellitus with other diabetic kidney complication: Secondary | ICD-10-CM | POA: Diagnosis not present

## 2019-05-19 DIAGNOSIS — D631 Anemia in chronic kidney disease: Secondary | ICD-10-CM | POA: Diagnosis not present

## 2019-05-19 DIAGNOSIS — Z992 Dependence on renal dialysis: Secondary | ICD-10-CM | POA: Diagnosis not present

## 2019-05-22 DIAGNOSIS — Z992 Dependence on renal dialysis: Secondary | ICD-10-CM | POA: Diagnosis not present

## 2019-05-22 DIAGNOSIS — N186 End stage renal disease: Secondary | ICD-10-CM | POA: Diagnosis not present

## 2019-05-22 DIAGNOSIS — D631 Anemia in chronic kidney disease: Secondary | ICD-10-CM | POA: Diagnosis not present

## 2019-05-22 DIAGNOSIS — E1129 Type 2 diabetes mellitus with other diabetic kidney complication: Secondary | ICD-10-CM | POA: Diagnosis not present

## 2019-05-22 DIAGNOSIS — N2581 Secondary hyperparathyroidism of renal origin: Secondary | ICD-10-CM | POA: Diagnosis not present

## 2019-05-23 DIAGNOSIS — Z992 Dependence on renal dialysis: Secondary | ICD-10-CM | POA: Diagnosis not present

## 2019-05-23 DIAGNOSIS — N186 End stage renal disease: Secondary | ICD-10-CM | POA: Diagnosis not present

## 2019-05-23 DIAGNOSIS — E1122 Type 2 diabetes mellitus with diabetic chronic kidney disease: Secondary | ICD-10-CM | POA: Diagnosis not present

## 2019-05-25 DIAGNOSIS — D509 Iron deficiency anemia, unspecified: Secondary | ICD-10-CM | POA: Diagnosis not present

## 2019-05-25 DIAGNOSIS — N186 End stage renal disease: Secondary | ICD-10-CM | POA: Diagnosis not present

## 2019-05-25 DIAGNOSIS — N2581 Secondary hyperparathyroidism of renal origin: Secondary | ICD-10-CM | POA: Diagnosis not present

## 2019-05-25 DIAGNOSIS — D631 Anemia in chronic kidney disease: Secondary | ICD-10-CM | POA: Diagnosis not present

## 2019-05-25 DIAGNOSIS — E1129 Type 2 diabetes mellitus with other diabetic kidney complication: Secondary | ICD-10-CM | POA: Diagnosis not present

## 2019-05-25 DIAGNOSIS — Z992 Dependence on renal dialysis: Secondary | ICD-10-CM | POA: Diagnosis not present

## 2019-05-26 ENCOUNTER — Ambulatory Visit: Payer: Medicare Other | Admitting: Sports Medicine

## 2019-05-26 ENCOUNTER — Other Ambulatory Visit: Payer: Self-pay | Admitting: Sports Medicine

## 2019-05-26 DIAGNOSIS — D631 Anemia in chronic kidney disease: Secondary | ICD-10-CM | POA: Diagnosis not present

## 2019-05-26 DIAGNOSIS — Z992 Dependence on renal dialysis: Secondary | ICD-10-CM | POA: Diagnosis not present

## 2019-05-26 DIAGNOSIS — M79671 Pain in right foot: Secondary | ICD-10-CM

## 2019-05-26 DIAGNOSIS — E1129 Type 2 diabetes mellitus with other diabetic kidney complication: Secondary | ICD-10-CM | POA: Diagnosis not present

## 2019-05-26 DIAGNOSIS — N2581 Secondary hyperparathyroidism of renal origin: Secondary | ICD-10-CM | POA: Diagnosis not present

## 2019-05-26 DIAGNOSIS — D509 Iron deficiency anemia, unspecified: Secondary | ICD-10-CM | POA: Diagnosis not present

## 2019-05-26 DIAGNOSIS — M79672 Pain in left foot: Secondary | ICD-10-CM

## 2019-05-26 DIAGNOSIS — N186 End stage renal disease: Secondary | ICD-10-CM | POA: Diagnosis not present

## 2019-05-29 DIAGNOSIS — N2581 Secondary hyperparathyroidism of renal origin: Secondary | ICD-10-CM | POA: Diagnosis not present

## 2019-05-29 DIAGNOSIS — D509 Iron deficiency anemia, unspecified: Secondary | ICD-10-CM | POA: Diagnosis not present

## 2019-05-29 DIAGNOSIS — D631 Anemia in chronic kidney disease: Secondary | ICD-10-CM | POA: Diagnosis not present

## 2019-05-29 DIAGNOSIS — Z992 Dependence on renal dialysis: Secondary | ICD-10-CM | POA: Diagnosis not present

## 2019-05-29 DIAGNOSIS — E1129 Type 2 diabetes mellitus with other diabetic kidney complication: Secondary | ICD-10-CM | POA: Diagnosis not present

## 2019-05-29 DIAGNOSIS — N186 End stage renal disease: Secondary | ICD-10-CM | POA: Diagnosis not present

## 2019-05-31 DIAGNOSIS — E1129 Type 2 diabetes mellitus with other diabetic kidney complication: Secondary | ICD-10-CM | POA: Diagnosis not present

## 2019-05-31 DIAGNOSIS — D631 Anemia in chronic kidney disease: Secondary | ICD-10-CM | POA: Diagnosis not present

## 2019-05-31 DIAGNOSIS — D509 Iron deficiency anemia, unspecified: Secondary | ICD-10-CM | POA: Diagnosis not present

## 2019-05-31 DIAGNOSIS — N2581 Secondary hyperparathyroidism of renal origin: Secondary | ICD-10-CM | POA: Diagnosis not present

## 2019-05-31 DIAGNOSIS — N186 End stage renal disease: Secondary | ICD-10-CM | POA: Diagnosis not present

## 2019-05-31 DIAGNOSIS — Z992 Dependence on renal dialysis: Secondary | ICD-10-CM | POA: Diagnosis not present

## 2019-06-02 DIAGNOSIS — D631 Anemia in chronic kidney disease: Secondary | ICD-10-CM | POA: Diagnosis not present

## 2019-06-02 DIAGNOSIS — N2581 Secondary hyperparathyroidism of renal origin: Secondary | ICD-10-CM | POA: Diagnosis not present

## 2019-06-02 DIAGNOSIS — E1129 Type 2 diabetes mellitus with other diabetic kidney complication: Secondary | ICD-10-CM | POA: Diagnosis not present

## 2019-06-02 DIAGNOSIS — Z992 Dependence on renal dialysis: Secondary | ICD-10-CM | POA: Diagnosis not present

## 2019-06-02 DIAGNOSIS — D509 Iron deficiency anemia, unspecified: Secondary | ICD-10-CM | POA: Diagnosis not present

## 2019-06-02 DIAGNOSIS — I63411 Cerebral infarction due to embolism of right middle cerebral artery: Secondary | ICD-10-CM | POA: Insufficient documentation

## 2019-06-02 DIAGNOSIS — N186 End stage renal disease: Secondary | ICD-10-CM | POA: Diagnosis not present

## 2019-06-03 DIAGNOSIS — I509 Heart failure, unspecified: Secondary | ICD-10-CM | POA: Diagnosis not present

## 2019-06-03 DIAGNOSIS — Z7982 Long term (current) use of aspirin: Secondary | ICD-10-CM | POA: Diagnosis not present

## 2019-06-03 DIAGNOSIS — N186 End stage renal disease: Secondary | ICD-10-CM | POA: Diagnosis not present

## 2019-06-03 DIAGNOSIS — R531 Weakness: Secondary | ICD-10-CM | POA: Diagnosis not present

## 2019-06-03 DIAGNOSIS — Z743 Need for continuous supervision: Secondary | ICD-10-CM | POA: Diagnosis not present

## 2019-06-03 DIAGNOSIS — E1165 Type 2 diabetes mellitus with hyperglycemia: Secondary | ICD-10-CM | POA: Diagnosis not present

## 2019-06-03 DIAGNOSIS — Z89612 Acquired absence of left leg above knee: Secondary | ICD-10-CM | POA: Diagnosis not present

## 2019-06-03 DIAGNOSIS — Z79899 Other long term (current) drug therapy: Secondary | ICD-10-CM | POA: Diagnosis not present

## 2019-06-03 DIAGNOSIS — I10 Essential (primary) hypertension: Secondary | ICD-10-CM | POA: Diagnosis not present

## 2019-06-03 DIAGNOSIS — I132 Hypertensive heart and chronic kidney disease with heart failure and with stage 5 chronic kidney disease, or end stage renal disease: Secondary | ICD-10-CM | POA: Diagnosis not present

## 2019-06-03 DIAGNOSIS — E1122 Type 2 diabetes mellitus with diabetic chronic kidney disease: Secondary | ICD-10-CM | POA: Diagnosis not present

## 2019-06-03 DIAGNOSIS — R51 Headache: Secondary | ICD-10-CM | POA: Diagnosis not present

## 2019-06-03 DIAGNOSIS — Z794 Long term (current) use of insulin: Secondary | ICD-10-CM | POA: Diagnosis not present

## 2019-06-03 DIAGNOSIS — Z8673 Personal history of transient ischemic attack (TIA), and cerebral infarction without residual deficits: Secondary | ICD-10-CM | POA: Diagnosis not present

## 2019-06-03 DIAGNOSIS — R930 Abnormal findings on diagnostic imaging of skull and head, not elsewhere classified: Secondary | ICD-10-CM | POA: Diagnosis not present

## 2019-06-03 DIAGNOSIS — R9431 Abnormal electrocardiogram [ECG] [EKG]: Secondary | ICD-10-CM | POA: Diagnosis not present

## 2019-06-03 DIAGNOSIS — Z992 Dependence on renal dialysis: Secondary | ICD-10-CM | POA: Diagnosis not present

## 2019-06-04 ENCOUNTER — Encounter: Payer: Self-pay | Admitting: Internal Medicine

## 2019-06-04 ENCOUNTER — Inpatient Hospital Stay (HOSPITAL_COMMUNITY)
Admission: AD | Admit: 2019-06-04 | Payer: Medicare Other | Source: Other Acute Inpatient Hospital | Admitting: Internal Medicine

## 2019-06-04 DIAGNOSIS — I2510 Atherosclerotic heart disease of native coronary artery without angina pectoris: Secondary | ICD-10-CM | POA: Diagnosis not present

## 2019-06-04 DIAGNOSIS — M542 Cervicalgia: Secondary | ICD-10-CM | POA: Insufficient documentation

## 2019-06-04 DIAGNOSIS — R29703 NIHSS score 3: Secondary | ICD-10-CM | POA: Diagnosis present

## 2019-06-04 DIAGNOSIS — I081 Rheumatic disorders of both mitral and tricuspid valves: Secondary | ICD-10-CM | POA: Diagnosis not present

## 2019-06-04 DIAGNOSIS — I63411 Cerebral infarction due to embolism of right middle cerebral artery: Secondary | ICD-10-CM | POA: Diagnosis not present

## 2019-06-04 DIAGNOSIS — E119 Type 2 diabetes mellitus without complications: Secondary | ICD-10-CM | POA: Diagnosis not present

## 2019-06-04 DIAGNOSIS — I517 Cardiomegaly: Secondary | ICD-10-CM | POA: Diagnosis not present

## 2019-06-04 DIAGNOSIS — I959 Hypotension, unspecified: Secondary | ICD-10-CM | POA: Diagnosis not present

## 2019-06-04 DIAGNOSIS — I5042 Chronic combined systolic (congestive) and diastolic (congestive) heart failure: Secondary | ICD-10-CM | POA: Diagnosis present

## 2019-06-04 DIAGNOSIS — Z794 Long term (current) use of insulin: Secondary | ICD-10-CM | POA: Diagnosis not present

## 2019-06-04 DIAGNOSIS — I10 Essential (primary) hypertension: Secondary | ICD-10-CM | POA: Diagnosis not present

## 2019-06-04 DIAGNOSIS — R918 Other nonspecific abnormal finding of lung field: Secondary | ICD-10-CM | POA: Diagnosis not present

## 2019-06-04 DIAGNOSIS — I639 Cerebral infarction, unspecified: Secondary | ICD-10-CM | POA: Diagnosis not present

## 2019-06-04 DIAGNOSIS — I6381 Other cerebral infarction due to occlusion or stenosis of small artery: Secondary | ICD-10-CM | POA: Diagnosis not present

## 2019-06-04 DIAGNOSIS — M069 Rheumatoid arthritis, unspecified: Secondary | ICD-10-CM | POA: Diagnosis present

## 2019-06-04 DIAGNOSIS — D631 Anemia in chronic kidney disease: Secondary | ICD-10-CM | POA: Diagnosis present

## 2019-06-04 DIAGNOSIS — E0865 Diabetes mellitus due to underlying condition with hyperglycemia: Secondary | ICD-10-CM | POA: Diagnosis not present

## 2019-06-04 DIAGNOSIS — G894 Chronic pain syndrome: Secondary | ICD-10-CM | POA: Diagnosis present

## 2019-06-04 DIAGNOSIS — I132 Hypertensive heart and chronic kidney disease with heart failure and with stage 5 chronic kidney disease, or end stage renal disease: Secondary | ICD-10-CM | POA: Diagnosis present

## 2019-06-04 DIAGNOSIS — E669 Obesity, unspecified: Secondary | ICD-10-CM | POA: Diagnosis present

## 2019-06-04 DIAGNOSIS — E1165 Type 2 diabetes mellitus with hyperglycemia: Secondary | ICD-10-CM | POA: Diagnosis present

## 2019-06-04 DIAGNOSIS — E1122 Type 2 diabetes mellitus with diabetic chronic kidney disease: Secondary | ICD-10-CM | POA: Diagnosis present

## 2019-06-04 DIAGNOSIS — I6319 Cerebral infarction due to embolism of other precerebral artery: Secondary | ICD-10-CM | POA: Diagnosis present

## 2019-06-04 DIAGNOSIS — E8889 Other specified metabolic disorders: Secondary | ICD-10-CM | POA: Diagnosis present

## 2019-06-04 DIAGNOSIS — N186 End stage renal disease: Secondary | ICD-10-CM | POA: Diagnosis present

## 2019-06-04 DIAGNOSIS — E785 Hyperlipidemia, unspecified: Secondary | ICD-10-CM | POA: Diagnosis present

## 2019-06-04 DIAGNOSIS — I340 Nonrheumatic mitral (valve) insufficiency: Secondary | ICD-10-CM | POA: Diagnosis not present

## 2019-06-04 DIAGNOSIS — F419 Anxiety disorder, unspecified: Secondary | ICD-10-CM | POA: Diagnosis present

## 2019-06-04 DIAGNOSIS — I6389 Other cerebral infarction: Secondary | ICD-10-CM | POA: Diagnosis not present

## 2019-06-04 DIAGNOSIS — E1159 Type 2 diabetes mellitus with other circulatory complications: Secondary | ICD-10-CM | POA: Diagnosis not present

## 2019-06-04 DIAGNOSIS — Z8673 Personal history of transient ischemic attack (TIA), and cerebral infarction without residual deficits: Secondary | ICD-10-CM | POA: Diagnosis not present

## 2019-06-04 DIAGNOSIS — I5189 Other ill-defined heart diseases: Secondary | ICD-10-CM | POA: Diagnosis not present

## 2019-06-04 DIAGNOSIS — I6300 Cerebral infarction due to thrombosis of unspecified precerebral artery: Secondary | ICD-10-CM | POA: Insufficient documentation

## 2019-06-04 DIAGNOSIS — E1151 Type 2 diabetes mellitus with diabetic peripheral angiopathy without gangrene: Secondary | ICD-10-CM | POA: Diagnosis present

## 2019-06-04 DIAGNOSIS — E875 Hyperkalemia: Secondary | ICD-10-CM | POA: Diagnosis present

## 2019-06-04 DIAGNOSIS — Z89512 Acquired absence of left leg below knee: Secondary | ICD-10-CM | POA: Diagnosis not present

## 2019-06-04 DIAGNOSIS — F329 Major depressive disorder, single episode, unspecified: Secondary | ICD-10-CM | POA: Diagnosis present

## 2019-06-04 DIAGNOSIS — I69322 Dysarthria following cerebral infarction: Secondary | ICD-10-CM | POA: Diagnosis not present

## 2019-06-04 DIAGNOSIS — Z992 Dependence on renal dialysis: Secondary | ICD-10-CM | POA: Diagnosis not present

## 2019-06-04 DIAGNOSIS — R52 Pain, unspecified: Secondary | ICD-10-CM | POA: Diagnosis not present

## 2019-06-04 DIAGNOSIS — Z20828 Contact with and (suspected) exposure to other viral communicable diseases: Secondary | ICD-10-CM | POA: Diagnosis present

## 2019-06-04 DIAGNOSIS — R0602 Shortness of breath: Secondary | ICD-10-CM | POA: Diagnosis not present

## 2019-06-04 DIAGNOSIS — I509 Heart failure, unspecified: Secondary | ICD-10-CM | POA: Diagnosis not present

## 2019-06-10 DIAGNOSIS — D631 Anemia in chronic kidney disease: Secondary | ICD-10-CM | POA: Diagnosis not present

## 2019-06-10 DIAGNOSIS — M5489 Other dorsalgia: Secondary | ICD-10-CM | POA: Diagnosis not present

## 2019-06-10 DIAGNOSIS — Z992 Dependence on renal dialysis: Secondary | ICD-10-CM | POA: Diagnosis not present

## 2019-06-10 DIAGNOSIS — R52 Pain, unspecified: Secondary | ICD-10-CM | POA: Diagnosis not present

## 2019-06-10 DIAGNOSIS — R0602 Shortness of breath: Secondary | ICD-10-CM | POA: Diagnosis not present

## 2019-06-10 DIAGNOSIS — I10 Essential (primary) hypertension: Secondary | ICD-10-CM | POA: Diagnosis not present

## 2019-06-10 DIAGNOSIS — D509 Iron deficiency anemia, unspecified: Secondary | ICD-10-CM | POA: Diagnosis not present

## 2019-06-10 DIAGNOSIS — N2581 Secondary hyperparathyroidism of renal origin: Secondary | ICD-10-CM | POA: Diagnosis not present

## 2019-06-10 DIAGNOSIS — N186 End stage renal disease: Secondary | ICD-10-CM | POA: Diagnosis not present

## 2019-06-10 DIAGNOSIS — I120 Hypertensive chronic kidney disease with stage 5 chronic kidney disease or end stage renal disease: Secondary | ICD-10-CM | POA: Diagnosis not present

## 2019-06-10 DIAGNOSIS — E1129 Type 2 diabetes mellitus with other diabetic kidney complication: Secondary | ICD-10-CM | POA: Diagnosis not present

## 2019-06-10 DIAGNOSIS — E1122 Type 2 diabetes mellitus with diabetic chronic kidney disease: Secondary | ICD-10-CM | POA: Diagnosis not present

## 2019-06-10 DIAGNOSIS — R0902 Hypoxemia: Secondary | ICD-10-CM | POA: Diagnosis not present

## 2019-06-10 DIAGNOSIS — J189 Pneumonia, unspecified organism: Secondary | ICD-10-CM | POA: Diagnosis not present

## 2019-06-14 ENCOUNTER — Other Ambulatory Visit: Payer: Self-pay | Admitting: Sports Medicine

## 2019-06-14 DIAGNOSIS — D631 Anemia in chronic kidney disease: Secondary | ICD-10-CM | POA: Diagnosis not present

## 2019-06-14 DIAGNOSIS — M79671 Pain in right foot: Secondary | ICD-10-CM

## 2019-06-14 DIAGNOSIS — N2581 Secondary hyperparathyroidism of renal origin: Secondary | ICD-10-CM | POA: Diagnosis not present

## 2019-06-14 DIAGNOSIS — D509 Iron deficiency anemia, unspecified: Secondary | ICD-10-CM | POA: Diagnosis not present

## 2019-06-14 DIAGNOSIS — E1129 Type 2 diabetes mellitus with other diabetic kidney complication: Secondary | ICD-10-CM | POA: Diagnosis not present

## 2019-06-14 DIAGNOSIS — N186 End stage renal disease: Secondary | ICD-10-CM | POA: Diagnosis not present

## 2019-06-14 DIAGNOSIS — Z992 Dependence on renal dialysis: Secondary | ICD-10-CM | POA: Diagnosis not present

## 2019-06-15 ENCOUNTER — Ambulatory Visit: Payer: Medicare Other | Admitting: Sports Medicine

## 2019-06-16 DIAGNOSIS — D631 Anemia in chronic kidney disease: Secondary | ICD-10-CM | POA: Diagnosis not present

## 2019-06-16 DIAGNOSIS — Z992 Dependence on renal dialysis: Secondary | ICD-10-CM | POA: Diagnosis not present

## 2019-06-16 DIAGNOSIS — E1129 Type 2 diabetes mellitus with other diabetic kidney complication: Secondary | ICD-10-CM | POA: Diagnosis not present

## 2019-06-16 DIAGNOSIS — D509 Iron deficiency anemia, unspecified: Secondary | ICD-10-CM | POA: Diagnosis not present

## 2019-06-16 DIAGNOSIS — N186 End stage renal disease: Secondary | ICD-10-CM | POA: Diagnosis not present

## 2019-06-16 DIAGNOSIS — N2581 Secondary hyperparathyroidism of renal origin: Secondary | ICD-10-CM | POA: Diagnosis not present

## 2019-06-19 DIAGNOSIS — D631 Anemia in chronic kidney disease: Secondary | ICD-10-CM | POA: Diagnosis not present

## 2019-06-19 DIAGNOSIS — N2581 Secondary hyperparathyroidism of renal origin: Secondary | ICD-10-CM | POA: Diagnosis not present

## 2019-06-19 DIAGNOSIS — N186 End stage renal disease: Secondary | ICD-10-CM | POA: Diagnosis not present

## 2019-06-19 DIAGNOSIS — D509 Iron deficiency anemia, unspecified: Secondary | ICD-10-CM | POA: Diagnosis not present

## 2019-06-19 DIAGNOSIS — E1129 Type 2 diabetes mellitus with other diabetic kidney complication: Secondary | ICD-10-CM | POA: Diagnosis not present

## 2019-06-19 DIAGNOSIS — Z992 Dependence on renal dialysis: Secondary | ICD-10-CM | POA: Diagnosis not present

## 2019-06-21 DIAGNOSIS — I69321 Dysphasia following cerebral infarction: Secondary | ICD-10-CM | POA: Diagnosis not present

## 2019-06-21 DIAGNOSIS — D509 Iron deficiency anemia, unspecified: Secondary | ICD-10-CM | POA: Diagnosis not present

## 2019-06-21 DIAGNOSIS — K5904 Chronic idiopathic constipation: Secondary | ICD-10-CM | POA: Diagnosis not present

## 2019-06-21 DIAGNOSIS — Z992 Dependence on renal dialysis: Secondary | ICD-10-CM | POA: Diagnosis not present

## 2019-06-21 DIAGNOSIS — E1129 Type 2 diabetes mellitus with other diabetic kidney complication: Secondary | ICD-10-CM | POA: Diagnosis not present

## 2019-06-21 DIAGNOSIS — D631 Anemia in chronic kidney disease: Secondary | ICD-10-CM | POA: Diagnosis not present

## 2019-06-21 DIAGNOSIS — E1042 Type 1 diabetes mellitus with diabetic polyneuropathy: Secondary | ICD-10-CM | POA: Diagnosis not present

## 2019-06-21 DIAGNOSIS — I255 Ischemic cardiomyopathy: Secondary | ICD-10-CM | POA: Diagnosis not present

## 2019-06-21 DIAGNOSIS — Z89512 Acquired absence of left leg below knee: Secondary | ICD-10-CM | POA: Diagnosis not present

## 2019-06-21 DIAGNOSIS — N186 End stage renal disease: Secondary | ICD-10-CM | POA: Diagnosis not present

## 2019-06-21 DIAGNOSIS — E782 Mixed hyperlipidemia: Secondary | ICD-10-CM | POA: Diagnosis not present

## 2019-06-21 DIAGNOSIS — Z6833 Body mass index (BMI) 33.0-33.9, adult: Secondary | ICD-10-CM | POA: Diagnosis not present

## 2019-06-21 DIAGNOSIS — I120 Hypertensive chronic kidney disease with stage 5 chronic kidney disease or end stage renal disease: Secondary | ICD-10-CM | POA: Diagnosis not present

## 2019-06-21 DIAGNOSIS — K210 Gastro-esophageal reflux disease with esophagitis: Secondary | ICD-10-CM | POA: Diagnosis not present

## 2019-06-21 DIAGNOSIS — Z1159 Encounter for screening for other viral diseases: Secondary | ICD-10-CM | POA: Diagnosis not present

## 2019-06-21 DIAGNOSIS — N2581 Secondary hyperparathyroidism of renal origin: Secondary | ICD-10-CM | POA: Diagnosis not present

## 2019-06-22 DIAGNOSIS — E1122 Type 2 diabetes mellitus with diabetic chronic kidney disease: Secondary | ICD-10-CM | POA: Diagnosis not present

## 2019-06-22 DIAGNOSIS — N186 End stage renal disease: Secondary | ICD-10-CM | POA: Diagnosis not present

## 2019-06-22 DIAGNOSIS — Z992 Dependence on renal dialysis: Secondary | ICD-10-CM | POA: Diagnosis not present

## 2019-06-26 DIAGNOSIS — Z23 Encounter for immunization: Secondary | ICD-10-CM | POA: Diagnosis not present

## 2019-06-26 DIAGNOSIS — Z992 Dependence on renal dialysis: Secondary | ICD-10-CM | POA: Diagnosis not present

## 2019-06-26 DIAGNOSIS — N186 End stage renal disease: Secondary | ICD-10-CM | POA: Diagnosis not present

## 2019-06-26 DIAGNOSIS — D631 Anemia in chronic kidney disease: Secondary | ICD-10-CM | POA: Diagnosis not present

## 2019-06-26 DIAGNOSIS — E1129 Type 2 diabetes mellitus with other diabetic kidney complication: Secondary | ICD-10-CM | POA: Diagnosis not present

## 2019-06-26 DIAGNOSIS — N2581 Secondary hyperparathyroidism of renal origin: Secondary | ICD-10-CM | POA: Diagnosis not present

## 2019-06-30 DIAGNOSIS — N2581 Secondary hyperparathyroidism of renal origin: Secondary | ICD-10-CM | POA: Diagnosis not present

## 2019-06-30 DIAGNOSIS — Z23 Encounter for immunization: Secondary | ICD-10-CM | POA: Diagnosis not present

## 2019-06-30 DIAGNOSIS — Z992 Dependence on renal dialysis: Secondary | ICD-10-CM | POA: Diagnosis not present

## 2019-06-30 DIAGNOSIS — N186 End stage renal disease: Secondary | ICD-10-CM | POA: Diagnosis not present

## 2019-06-30 DIAGNOSIS — D631 Anemia in chronic kidney disease: Secondary | ICD-10-CM | POA: Diagnosis not present

## 2019-06-30 DIAGNOSIS — E1129 Type 2 diabetes mellitus with other diabetic kidney complication: Secondary | ICD-10-CM | POA: Diagnosis not present

## 2019-07-02 DIAGNOSIS — I120 Hypertensive chronic kidney disease with stage 5 chronic kidney disease or end stage renal disease: Secondary | ICD-10-CM | POA: Diagnosis not present

## 2019-07-02 DIAGNOSIS — Z992 Dependence on renal dialysis: Secondary | ICD-10-CM | POA: Diagnosis not present

## 2019-07-02 DIAGNOSIS — E10620 Type 1 diabetes mellitus with diabetic dermatitis: Secondary | ICD-10-CM | POA: Diagnosis not present

## 2019-07-02 DIAGNOSIS — Z6833 Body mass index (BMI) 33.0-33.9, adult: Secondary | ICD-10-CM | POA: Diagnosis not present

## 2019-07-02 DIAGNOSIS — E782 Mixed hyperlipidemia: Secondary | ICD-10-CM | POA: Diagnosis not present

## 2019-07-02 DIAGNOSIS — K5904 Chronic idiopathic constipation: Secondary | ICD-10-CM | POA: Diagnosis not present

## 2019-07-02 DIAGNOSIS — E1042 Type 1 diabetes mellitus with diabetic polyneuropathy: Secondary | ICD-10-CM | POA: Diagnosis not present

## 2019-07-02 DIAGNOSIS — E1022 Type 1 diabetes mellitus with diabetic chronic kidney disease: Secondary | ICD-10-CM | POA: Diagnosis not present

## 2019-07-02 DIAGNOSIS — E669 Obesity, unspecified: Secondary | ICD-10-CM | POA: Diagnosis not present

## 2019-07-02 DIAGNOSIS — K2100 Gastro-esophageal reflux disease with esophagitis, without bleeding: Secondary | ICD-10-CM | POA: Diagnosis not present

## 2019-07-02 DIAGNOSIS — I69321 Dysphasia following cerebral infarction: Secondary | ICD-10-CM | POA: Diagnosis not present

## 2019-07-02 DIAGNOSIS — I255 Ischemic cardiomyopathy: Secondary | ICD-10-CM | POA: Diagnosis not present

## 2019-07-02 DIAGNOSIS — E1051 Type 1 diabetes mellitus with diabetic peripheral angiopathy without gangrene: Secondary | ICD-10-CM | POA: Diagnosis not present

## 2019-07-02 DIAGNOSIS — M47816 Spondylosis without myelopathy or radiculopathy, lumbar region: Secondary | ICD-10-CM | POA: Diagnosis not present

## 2019-07-02 DIAGNOSIS — N186 End stage renal disease: Secondary | ICD-10-CM | POA: Diagnosis not present

## 2019-07-02 DIAGNOSIS — Z7982 Long term (current) use of aspirin: Secondary | ICD-10-CM | POA: Diagnosis not present

## 2019-07-02 DIAGNOSIS — Z89512 Acquired absence of left leg below knee: Secondary | ICD-10-CM | POA: Diagnosis not present

## 2019-07-03 DIAGNOSIS — E1129 Type 2 diabetes mellitus with other diabetic kidney complication: Secondary | ICD-10-CM | POA: Diagnosis not present

## 2019-07-03 DIAGNOSIS — Z23 Encounter for immunization: Secondary | ICD-10-CM | POA: Diagnosis not present

## 2019-07-03 DIAGNOSIS — D631 Anemia in chronic kidney disease: Secondary | ICD-10-CM | POA: Diagnosis not present

## 2019-07-03 DIAGNOSIS — N2581 Secondary hyperparathyroidism of renal origin: Secondary | ICD-10-CM | POA: Diagnosis not present

## 2019-07-03 DIAGNOSIS — Z992 Dependence on renal dialysis: Secondary | ICD-10-CM | POA: Diagnosis not present

## 2019-07-03 DIAGNOSIS — N186 End stage renal disease: Secondary | ICD-10-CM | POA: Diagnosis not present

## 2019-07-05 DIAGNOSIS — Z23 Encounter for immunization: Secondary | ICD-10-CM | POA: Diagnosis not present

## 2019-07-05 DIAGNOSIS — D631 Anemia in chronic kidney disease: Secondary | ICD-10-CM | POA: Diagnosis not present

## 2019-07-05 DIAGNOSIS — E1129 Type 2 diabetes mellitus with other diabetic kidney complication: Secondary | ICD-10-CM | POA: Diagnosis not present

## 2019-07-05 DIAGNOSIS — Z992 Dependence on renal dialysis: Secondary | ICD-10-CM | POA: Diagnosis not present

## 2019-07-05 DIAGNOSIS — N186 End stage renal disease: Secondary | ICD-10-CM | POA: Diagnosis not present

## 2019-07-05 DIAGNOSIS — N2581 Secondary hyperparathyroidism of renal origin: Secondary | ICD-10-CM | POA: Diagnosis not present

## 2019-07-06 DIAGNOSIS — Z7982 Long term (current) use of aspirin: Secondary | ICD-10-CM | POA: Diagnosis not present

## 2019-07-06 DIAGNOSIS — E1022 Type 1 diabetes mellitus with diabetic chronic kidney disease: Secondary | ICD-10-CM | POA: Diagnosis not present

## 2019-07-06 DIAGNOSIS — I120 Hypertensive chronic kidney disease with stage 5 chronic kidney disease or end stage renal disease: Secondary | ICD-10-CM | POA: Diagnosis not present

## 2019-07-06 DIAGNOSIS — K5904 Chronic idiopathic constipation: Secondary | ICD-10-CM | POA: Diagnosis not present

## 2019-07-06 DIAGNOSIS — Z89512 Acquired absence of left leg below knee: Secondary | ICD-10-CM | POA: Diagnosis not present

## 2019-07-06 DIAGNOSIS — N186 End stage renal disease: Secondary | ICD-10-CM | POA: Diagnosis not present

## 2019-07-07 ENCOUNTER — Other Ambulatory Visit: Payer: Self-pay

## 2019-07-07 DIAGNOSIS — N186 End stage renal disease: Secondary | ICD-10-CM | POA: Diagnosis not present

## 2019-07-07 DIAGNOSIS — E1129 Type 2 diabetes mellitus with other diabetic kidney complication: Secondary | ICD-10-CM | POA: Diagnosis not present

## 2019-07-07 DIAGNOSIS — N2581 Secondary hyperparathyroidism of renal origin: Secondary | ICD-10-CM | POA: Diagnosis not present

## 2019-07-07 DIAGNOSIS — Z992 Dependence on renal dialysis: Secondary | ICD-10-CM | POA: Diagnosis not present

## 2019-07-07 DIAGNOSIS — D631 Anemia in chronic kidney disease: Secondary | ICD-10-CM | POA: Diagnosis not present

## 2019-07-07 DIAGNOSIS — Z23 Encounter for immunization: Secondary | ICD-10-CM | POA: Diagnosis not present

## 2019-07-07 DIAGNOSIS — I739 Peripheral vascular disease, unspecified: Secondary | ICD-10-CM

## 2019-07-10 DIAGNOSIS — N2581 Secondary hyperparathyroidism of renal origin: Secondary | ICD-10-CM | POA: Diagnosis not present

## 2019-07-10 DIAGNOSIS — D631 Anemia in chronic kidney disease: Secondary | ICD-10-CM | POA: Diagnosis not present

## 2019-07-10 DIAGNOSIS — Z23 Encounter for immunization: Secondary | ICD-10-CM | POA: Diagnosis not present

## 2019-07-10 DIAGNOSIS — Z992 Dependence on renal dialysis: Secondary | ICD-10-CM | POA: Diagnosis not present

## 2019-07-10 DIAGNOSIS — N186 End stage renal disease: Secondary | ICD-10-CM | POA: Diagnosis not present

## 2019-07-10 DIAGNOSIS — E1129 Type 2 diabetes mellitus with other diabetic kidney complication: Secondary | ICD-10-CM | POA: Diagnosis not present

## 2019-07-11 ENCOUNTER — Encounter: Payer: Medicare Other | Admitting: Vascular Surgery

## 2019-07-11 ENCOUNTER — Encounter (HOSPITAL_COMMUNITY): Payer: Medicare Other

## 2019-07-11 DIAGNOSIS — K5904 Chronic idiopathic constipation: Secondary | ICD-10-CM | POA: Diagnosis not present

## 2019-07-11 DIAGNOSIS — Z7982 Long term (current) use of aspirin: Secondary | ICD-10-CM | POA: Diagnosis not present

## 2019-07-11 DIAGNOSIS — I120 Hypertensive chronic kidney disease with stage 5 chronic kidney disease or end stage renal disease: Secondary | ICD-10-CM | POA: Diagnosis not present

## 2019-07-11 DIAGNOSIS — N186 End stage renal disease: Secondary | ICD-10-CM | POA: Diagnosis not present

## 2019-07-11 DIAGNOSIS — E1022 Type 1 diabetes mellitus with diabetic chronic kidney disease: Secondary | ICD-10-CM | POA: Diagnosis not present

## 2019-07-11 DIAGNOSIS — Z89512 Acquired absence of left leg below knee: Secondary | ICD-10-CM | POA: Diagnosis not present

## 2019-07-12 DIAGNOSIS — Z7982 Long term (current) use of aspirin: Secondary | ICD-10-CM | POA: Diagnosis not present

## 2019-07-12 DIAGNOSIS — I120 Hypertensive chronic kidney disease with stage 5 chronic kidney disease or end stage renal disease: Secondary | ICD-10-CM | POA: Diagnosis not present

## 2019-07-12 DIAGNOSIS — Z89512 Acquired absence of left leg below knee: Secondary | ICD-10-CM | POA: Diagnosis not present

## 2019-07-12 DIAGNOSIS — K5904 Chronic idiopathic constipation: Secondary | ICD-10-CM | POA: Diagnosis not present

## 2019-07-12 DIAGNOSIS — N186 End stage renal disease: Secondary | ICD-10-CM | POA: Diagnosis not present

## 2019-07-12 DIAGNOSIS — E1022 Type 1 diabetes mellitus with diabetic chronic kidney disease: Secondary | ICD-10-CM | POA: Diagnosis not present

## 2019-07-13 DIAGNOSIS — E1022 Type 1 diabetes mellitus with diabetic chronic kidney disease: Secondary | ICD-10-CM | POA: Diagnosis not present

## 2019-07-13 DIAGNOSIS — Z89512 Acquired absence of left leg below knee: Secondary | ICD-10-CM | POA: Diagnosis not present

## 2019-07-13 DIAGNOSIS — N186 End stage renal disease: Secondary | ICD-10-CM | POA: Diagnosis not present

## 2019-07-13 DIAGNOSIS — I120 Hypertensive chronic kidney disease with stage 5 chronic kidney disease or end stage renal disease: Secondary | ICD-10-CM | POA: Diagnosis not present

## 2019-07-13 DIAGNOSIS — K5904 Chronic idiopathic constipation: Secondary | ICD-10-CM | POA: Diagnosis not present

## 2019-07-13 DIAGNOSIS — Z7982 Long term (current) use of aspirin: Secondary | ICD-10-CM | POA: Diagnosis not present

## 2019-07-14 DIAGNOSIS — Z23 Encounter for immunization: Secondary | ICD-10-CM | POA: Diagnosis not present

## 2019-07-14 DIAGNOSIS — N186 End stage renal disease: Secondary | ICD-10-CM | POA: Diagnosis not present

## 2019-07-14 DIAGNOSIS — E1129 Type 2 diabetes mellitus with other diabetic kidney complication: Secondary | ICD-10-CM | POA: Diagnosis not present

## 2019-07-14 DIAGNOSIS — N2581 Secondary hyperparathyroidism of renal origin: Secondary | ICD-10-CM | POA: Diagnosis not present

## 2019-07-14 DIAGNOSIS — Z992 Dependence on renal dialysis: Secondary | ICD-10-CM | POA: Diagnosis not present

## 2019-07-14 DIAGNOSIS — D631 Anemia in chronic kidney disease: Secondary | ICD-10-CM | POA: Diagnosis not present

## 2019-07-17 DIAGNOSIS — E1129 Type 2 diabetes mellitus with other diabetic kidney complication: Secondary | ICD-10-CM | POA: Diagnosis not present

## 2019-07-17 DIAGNOSIS — E1022 Type 1 diabetes mellitus with diabetic chronic kidney disease: Secondary | ICD-10-CM | POA: Diagnosis not present

## 2019-07-17 DIAGNOSIS — N186 End stage renal disease: Secondary | ICD-10-CM | POA: Diagnosis not present

## 2019-07-17 DIAGNOSIS — Z89512 Acquired absence of left leg below knee: Secondary | ICD-10-CM | POA: Diagnosis not present

## 2019-07-17 DIAGNOSIS — I120 Hypertensive chronic kidney disease with stage 5 chronic kidney disease or end stage renal disease: Secondary | ICD-10-CM | POA: Diagnosis not present

## 2019-07-17 DIAGNOSIS — K5904 Chronic idiopathic constipation: Secondary | ICD-10-CM | POA: Diagnosis not present

## 2019-07-17 DIAGNOSIS — N2581 Secondary hyperparathyroidism of renal origin: Secondary | ICD-10-CM | POA: Diagnosis not present

## 2019-07-17 DIAGNOSIS — D631 Anemia in chronic kidney disease: Secondary | ICD-10-CM | POA: Diagnosis not present

## 2019-07-17 DIAGNOSIS — Z992 Dependence on renal dialysis: Secondary | ICD-10-CM | POA: Diagnosis not present

## 2019-07-17 DIAGNOSIS — Z23 Encounter for immunization: Secondary | ICD-10-CM | POA: Diagnosis not present

## 2019-07-17 DIAGNOSIS — Z7982 Long term (current) use of aspirin: Secondary | ICD-10-CM | POA: Diagnosis not present

## 2019-07-18 DIAGNOSIS — E1022 Type 1 diabetes mellitus with diabetic chronic kidney disease: Secondary | ICD-10-CM | POA: Diagnosis not present

## 2019-07-18 DIAGNOSIS — Z89512 Acquired absence of left leg below knee: Secondary | ICD-10-CM | POA: Diagnosis not present

## 2019-07-18 DIAGNOSIS — K5904 Chronic idiopathic constipation: Secondary | ICD-10-CM | POA: Diagnosis not present

## 2019-07-18 DIAGNOSIS — N186 End stage renal disease: Secondary | ICD-10-CM | POA: Diagnosis not present

## 2019-07-18 DIAGNOSIS — I120 Hypertensive chronic kidney disease with stage 5 chronic kidney disease or end stage renal disease: Secondary | ICD-10-CM | POA: Diagnosis not present

## 2019-07-18 DIAGNOSIS — Z7982 Long term (current) use of aspirin: Secondary | ICD-10-CM | POA: Diagnosis not present

## 2019-07-19 DIAGNOSIS — N186 End stage renal disease: Secondary | ICD-10-CM | POA: Diagnosis not present

## 2019-07-19 DIAGNOSIS — E1129 Type 2 diabetes mellitus with other diabetic kidney complication: Secondary | ICD-10-CM | POA: Diagnosis not present

## 2019-07-19 DIAGNOSIS — D631 Anemia in chronic kidney disease: Secondary | ICD-10-CM | POA: Diagnosis not present

## 2019-07-19 DIAGNOSIS — N2581 Secondary hyperparathyroidism of renal origin: Secondary | ICD-10-CM | POA: Diagnosis not present

## 2019-07-19 DIAGNOSIS — Z23 Encounter for immunization: Secondary | ICD-10-CM | POA: Diagnosis not present

## 2019-07-19 DIAGNOSIS — Z992 Dependence on renal dialysis: Secondary | ICD-10-CM | POA: Diagnosis not present

## 2019-07-20 DIAGNOSIS — Z89512 Acquired absence of left leg below knee: Secondary | ICD-10-CM | POA: Diagnosis not present

## 2019-07-20 DIAGNOSIS — K5904 Chronic idiopathic constipation: Secondary | ICD-10-CM | POA: Diagnosis not present

## 2019-07-20 DIAGNOSIS — N186 End stage renal disease: Secondary | ICD-10-CM | POA: Diagnosis not present

## 2019-07-20 DIAGNOSIS — Z7982 Long term (current) use of aspirin: Secondary | ICD-10-CM | POA: Diagnosis not present

## 2019-07-20 DIAGNOSIS — E1022 Type 1 diabetes mellitus with diabetic chronic kidney disease: Secondary | ICD-10-CM | POA: Diagnosis not present

## 2019-07-20 DIAGNOSIS — I120 Hypertensive chronic kidney disease with stage 5 chronic kidney disease or end stage renal disease: Secondary | ICD-10-CM | POA: Diagnosis not present

## 2019-07-23 DIAGNOSIS — Z992 Dependence on renal dialysis: Secondary | ICD-10-CM | POA: Diagnosis not present

## 2019-07-23 DIAGNOSIS — N186 End stage renal disease: Secondary | ICD-10-CM | POA: Diagnosis not present

## 2019-07-23 DIAGNOSIS — E1122 Type 2 diabetes mellitus with diabetic chronic kidney disease: Secondary | ICD-10-CM | POA: Diagnosis not present

## 2019-07-24 DIAGNOSIS — Z992 Dependence on renal dialysis: Secondary | ICD-10-CM | POA: Diagnosis not present

## 2019-07-24 DIAGNOSIS — D631 Anemia in chronic kidney disease: Secondary | ICD-10-CM | POA: Diagnosis not present

## 2019-07-24 DIAGNOSIS — E1129 Type 2 diabetes mellitus with other diabetic kidney complication: Secondary | ICD-10-CM | POA: Diagnosis not present

## 2019-07-24 DIAGNOSIS — N2581 Secondary hyperparathyroidism of renal origin: Secondary | ICD-10-CM | POA: Diagnosis not present

## 2019-07-24 DIAGNOSIS — D509 Iron deficiency anemia, unspecified: Secondary | ICD-10-CM | POA: Diagnosis not present

## 2019-07-24 DIAGNOSIS — N186 End stage renal disease: Secondary | ICD-10-CM | POA: Diagnosis not present

## 2019-07-25 DIAGNOSIS — I120 Hypertensive chronic kidney disease with stage 5 chronic kidney disease or end stage renal disease: Secondary | ICD-10-CM | POA: Diagnosis not present

## 2019-07-25 DIAGNOSIS — E1022 Type 1 diabetes mellitus with diabetic chronic kidney disease: Secondary | ICD-10-CM | POA: Diagnosis not present

## 2019-07-25 DIAGNOSIS — N186 End stage renal disease: Secondary | ICD-10-CM | POA: Diagnosis not present

## 2019-07-25 DIAGNOSIS — K5904 Chronic idiopathic constipation: Secondary | ICD-10-CM | POA: Diagnosis not present

## 2019-07-25 DIAGNOSIS — Z89512 Acquired absence of left leg below knee: Secondary | ICD-10-CM | POA: Diagnosis not present

## 2019-07-25 DIAGNOSIS — Z7982 Long term (current) use of aspirin: Secondary | ICD-10-CM | POA: Diagnosis not present

## 2019-07-27 DIAGNOSIS — E1022 Type 1 diabetes mellitus with diabetic chronic kidney disease: Secondary | ICD-10-CM | POA: Diagnosis not present

## 2019-07-27 DIAGNOSIS — N186 End stage renal disease: Secondary | ICD-10-CM | POA: Diagnosis not present

## 2019-07-27 DIAGNOSIS — K5904 Chronic idiopathic constipation: Secondary | ICD-10-CM | POA: Diagnosis not present

## 2019-07-27 DIAGNOSIS — I120 Hypertensive chronic kidney disease with stage 5 chronic kidney disease or end stage renal disease: Secondary | ICD-10-CM | POA: Diagnosis not present

## 2019-07-27 DIAGNOSIS — Z7982 Long term (current) use of aspirin: Secondary | ICD-10-CM | POA: Diagnosis not present

## 2019-07-27 DIAGNOSIS — Z89512 Acquired absence of left leg below knee: Secondary | ICD-10-CM | POA: Diagnosis not present

## 2019-07-28 DIAGNOSIS — Z992 Dependence on renal dialysis: Secondary | ICD-10-CM | POA: Diagnosis not present

## 2019-07-28 DIAGNOSIS — E1129 Type 2 diabetes mellitus with other diabetic kidney complication: Secondary | ICD-10-CM | POA: Diagnosis not present

## 2019-07-28 DIAGNOSIS — N186 End stage renal disease: Secondary | ICD-10-CM | POA: Diagnosis not present

## 2019-07-28 DIAGNOSIS — D631 Anemia in chronic kidney disease: Secondary | ICD-10-CM | POA: Diagnosis not present

## 2019-07-28 DIAGNOSIS — N2581 Secondary hyperparathyroidism of renal origin: Secondary | ICD-10-CM | POA: Diagnosis not present

## 2019-07-28 DIAGNOSIS — D509 Iron deficiency anemia, unspecified: Secondary | ICD-10-CM | POA: Diagnosis not present

## 2019-07-31 DIAGNOSIS — D631 Anemia in chronic kidney disease: Secondary | ICD-10-CM | POA: Diagnosis not present

## 2019-07-31 DIAGNOSIS — N186 End stage renal disease: Secondary | ICD-10-CM | POA: Diagnosis not present

## 2019-07-31 DIAGNOSIS — D509 Iron deficiency anemia, unspecified: Secondary | ICD-10-CM | POA: Diagnosis not present

## 2019-07-31 DIAGNOSIS — Z992 Dependence on renal dialysis: Secondary | ICD-10-CM | POA: Diagnosis not present

## 2019-07-31 DIAGNOSIS — E1129 Type 2 diabetes mellitus with other diabetic kidney complication: Secondary | ICD-10-CM | POA: Diagnosis not present

## 2019-07-31 DIAGNOSIS — N2581 Secondary hyperparathyroidism of renal origin: Secondary | ICD-10-CM | POA: Diagnosis not present

## 2019-08-01 DIAGNOSIS — K5904 Chronic idiopathic constipation: Secondary | ICD-10-CM | POA: Diagnosis not present

## 2019-08-01 DIAGNOSIS — Z6833 Body mass index (BMI) 33.0-33.9, adult: Secondary | ICD-10-CM | POA: Diagnosis not present

## 2019-08-01 DIAGNOSIS — Z992 Dependence on renal dialysis: Secondary | ICD-10-CM | POA: Diagnosis not present

## 2019-08-01 DIAGNOSIS — E10620 Type 1 diabetes mellitus with diabetic dermatitis: Secondary | ICD-10-CM | POA: Diagnosis not present

## 2019-08-01 DIAGNOSIS — E1051 Type 1 diabetes mellitus with diabetic peripheral angiopathy without gangrene: Secondary | ICD-10-CM | POA: Diagnosis not present

## 2019-08-01 DIAGNOSIS — I69321 Dysphasia following cerebral infarction: Secondary | ICD-10-CM | POA: Diagnosis not present

## 2019-08-01 DIAGNOSIS — Z7982 Long term (current) use of aspirin: Secondary | ICD-10-CM | POA: Diagnosis not present

## 2019-08-01 DIAGNOSIS — N186 End stage renal disease: Secondary | ICD-10-CM | POA: Diagnosis not present

## 2019-08-01 DIAGNOSIS — K2100 Gastro-esophageal reflux disease with esophagitis, without bleeding: Secondary | ICD-10-CM | POA: Diagnosis not present

## 2019-08-01 DIAGNOSIS — E669 Obesity, unspecified: Secondary | ICD-10-CM | POA: Diagnosis not present

## 2019-08-01 DIAGNOSIS — Z89512 Acquired absence of left leg below knee: Secondary | ICD-10-CM | POA: Diagnosis not present

## 2019-08-01 DIAGNOSIS — M47816 Spondylosis without myelopathy or radiculopathy, lumbar region: Secondary | ICD-10-CM | POA: Diagnosis not present

## 2019-08-01 DIAGNOSIS — E1022 Type 1 diabetes mellitus with diabetic chronic kidney disease: Secondary | ICD-10-CM | POA: Diagnosis not present

## 2019-08-01 DIAGNOSIS — I255 Ischemic cardiomyopathy: Secondary | ICD-10-CM | POA: Diagnosis not present

## 2019-08-01 DIAGNOSIS — E1042 Type 1 diabetes mellitus with diabetic polyneuropathy: Secondary | ICD-10-CM | POA: Diagnosis not present

## 2019-08-01 DIAGNOSIS — I120 Hypertensive chronic kidney disease with stage 5 chronic kidney disease or end stage renal disease: Secondary | ICD-10-CM | POA: Diagnosis not present

## 2019-08-01 DIAGNOSIS — E782 Mixed hyperlipidemia: Secondary | ICD-10-CM | POA: Diagnosis not present

## 2019-08-03 DIAGNOSIS — N186 End stage renal disease: Secondary | ICD-10-CM | POA: Diagnosis not present

## 2019-08-03 DIAGNOSIS — Z89512 Acquired absence of left leg below knee: Secondary | ICD-10-CM | POA: Diagnosis not present

## 2019-08-03 DIAGNOSIS — I120 Hypertensive chronic kidney disease with stage 5 chronic kidney disease or end stage renal disease: Secondary | ICD-10-CM | POA: Diagnosis not present

## 2019-08-03 DIAGNOSIS — E1022 Type 1 diabetes mellitus with diabetic chronic kidney disease: Secondary | ICD-10-CM | POA: Diagnosis not present

## 2019-08-03 DIAGNOSIS — Z7982 Long term (current) use of aspirin: Secondary | ICD-10-CM | POA: Diagnosis not present

## 2019-08-03 DIAGNOSIS — K5904 Chronic idiopathic constipation: Secondary | ICD-10-CM | POA: Diagnosis not present

## 2019-08-04 DIAGNOSIS — Z992 Dependence on renal dialysis: Secondary | ICD-10-CM | POA: Diagnosis not present

## 2019-08-04 DIAGNOSIS — N186 End stage renal disease: Secondary | ICD-10-CM | POA: Diagnosis not present

## 2019-08-04 DIAGNOSIS — E1129 Type 2 diabetes mellitus with other diabetic kidney complication: Secondary | ICD-10-CM | POA: Diagnosis not present

## 2019-08-04 DIAGNOSIS — N2581 Secondary hyperparathyroidism of renal origin: Secondary | ICD-10-CM | POA: Diagnosis not present

## 2019-08-04 DIAGNOSIS — D631 Anemia in chronic kidney disease: Secondary | ICD-10-CM | POA: Diagnosis not present

## 2019-08-04 DIAGNOSIS — D509 Iron deficiency anemia, unspecified: Secondary | ICD-10-CM | POA: Diagnosis not present

## 2019-08-08 DIAGNOSIS — R0602 Shortness of breath: Secondary | ICD-10-CM | POA: Diagnosis not present

## 2019-08-08 DIAGNOSIS — E875 Hyperkalemia: Secondary | ICD-10-CM | POA: Diagnosis not present

## 2019-08-08 DIAGNOSIS — R079 Chest pain, unspecified: Secondary | ICD-10-CM | POA: Diagnosis not present

## 2019-08-08 DIAGNOSIS — R0789 Other chest pain: Secondary | ICD-10-CM | POA: Diagnosis not present

## 2019-08-08 DIAGNOSIS — I10 Essential (primary) hypertension: Secondary | ICD-10-CM | POA: Diagnosis not present

## 2019-08-08 DIAGNOSIS — I160 Hypertensive urgency: Secondary | ICD-10-CM | POA: Diagnosis not present

## 2019-08-09 DIAGNOSIS — D631 Anemia in chronic kidney disease: Secondary | ICD-10-CM | POA: Diagnosis not present

## 2019-08-09 DIAGNOSIS — N2581 Secondary hyperparathyroidism of renal origin: Secondary | ICD-10-CM | POA: Diagnosis not present

## 2019-08-09 DIAGNOSIS — Z992 Dependence on renal dialysis: Secondary | ICD-10-CM | POA: Diagnosis not present

## 2019-08-09 DIAGNOSIS — N186 End stage renal disease: Secondary | ICD-10-CM | POA: Diagnosis not present

## 2019-08-09 DIAGNOSIS — R079 Chest pain, unspecified: Secondary | ICD-10-CM | POA: Diagnosis not present

## 2019-08-09 DIAGNOSIS — E1129 Type 2 diabetes mellitus with other diabetic kidney complication: Secondary | ICD-10-CM | POA: Diagnosis not present

## 2019-08-09 DIAGNOSIS — D509 Iron deficiency anemia, unspecified: Secondary | ICD-10-CM | POA: Diagnosis not present

## 2019-08-11 DIAGNOSIS — Z992 Dependence on renal dialysis: Secondary | ICD-10-CM | POA: Diagnosis not present

## 2019-08-11 DIAGNOSIS — D509 Iron deficiency anemia, unspecified: Secondary | ICD-10-CM | POA: Diagnosis not present

## 2019-08-11 DIAGNOSIS — D631 Anemia in chronic kidney disease: Secondary | ICD-10-CM | POA: Diagnosis not present

## 2019-08-11 DIAGNOSIS — N186 End stage renal disease: Secondary | ICD-10-CM | POA: Diagnosis not present

## 2019-08-11 DIAGNOSIS — N2581 Secondary hyperparathyroidism of renal origin: Secondary | ICD-10-CM | POA: Diagnosis not present

## 2019-08-11 DIAGNOSIS — E1129 Type 2 diabetes mellitus with other diabetic kidney complication: Secondary | ICD-10-CM | POA: Diagnosis not present

## 2019-08-14 DIAGNOSIS — E1129 Type 2 diabetes mellitus with other diabetic kidney complication: Secondary | ICD-10-CM | POA: Diagnosis not present

## 2019-08-14 DIAGNOSIS — Z992 Dependence on renal dialysis: Secondary | ICD-10-CM | POA: Diagnosis not present

## 2019-08-14 DIAGNOSIS — D509 Iron deficiency anemia, unspecified: Secondary | ICD-10-CM | POA: Diagnosis not present

## 2019-08-14 DIAGNOSIS — D631 Anemia in chronic kidney disease: Secondary | ICD-10-CM | POA: Diagnosis not present

## 2019-08-14 DIAGNOSIS — N2581 Secondary hyperparathyroidism of renal origin: Secondary | ICD-10-CM | POA: Diagnosis not present

## 2019-08-14 DIAGNOSIS — N186 End stage renal disease: Secondary | ICD-10-CM | POA: Diagnosis not present

## 2019-08-16 DIAGNOSIS — Z992 Dependence on renal dialysis: Secondary | ICD-10-CM | POA: Diagnosis not present

## 2019-08-16 DIAGNOSIS — N2581 Secondary hyperparathyroidism of renal origin: Secondary | ICD-10-CM | POA: Diagnosis not present

## 2019-08-16 DIAGNOSIS — N186 End stage renal disease: Secondary | ICD-10-CM | POA: Diagnosis not present

## 2019-08-16 DIAGNOSIS — D509 Iron deficiency anemia, unspecified: Secondary | ICD-10-CM | POA: Diagnosis not present

## 2019-08-16 DIAGNOSIS — E1129 Type 2 diabetes mellitus with other diabetic kidney complication: Secondary | ICD-10-CM | POA: Diagnosis not present

## 2019-08-16 DIAGNOSIS — D631 Anemia in chronic kidney disease: Secondary | ICD-10-CM | POA: Diagnosis not present

## 2019-08-18 DIAGNOSIS — N2581 Secondary hyperparathyroidism of renal origin: Secondary | ICD-10-CM | POA: Diagnosis not present

## 2019-08-18 DIAGNOSIS — N186 End stage renal disease: Secondary | ICD-10-CM | POA: Diagnosis not present

## 2019-08-18 DIAGNOSIS — Z992 Dependence on renal dialysis: Secondary | ICD-10-CM | POA: Diagnosis not present

## 2019-08-18 DIAGNOSIS — D509 Iron deficiency anemia, unspecified: Secondary | ICD-10-CM | POA: Diagnosis not present

## 2019-08-18 DIAGNOSIS — D631 Anemia in chronic kidney disease: Secondary | ICD-10-CM | POA: Diagnosis not present

## 2019-08-18 DIAGNOSIS — E1129 Type 2 diabetes mellitus with other diabetic kidney complication: Secondary | ICD-10-CM | POA: Diagnosis not present

## 2019-08-21 DIAGNOSIS — D631 Anemia in chronic kidney disease: Secondary | ICD-10-CM | POA: Diagnosis not present

## 2019-08-21 DIAGNOSIS — E1129 Type 2 diabetes mellitus with other diabetic kidney complication: Secondary | ICD-10-CM | POA: Diagnosis not present

## 2019-08-21 DIAGNOSIS — N186 End stage renal disease: Secondary | ICD-10-CM | POA: Diagnosis not present

## 2019-08-21 DIAGNOSIS — Z992 Dependence on renal dialysis: Secondary | ICD-10-CM | POA: Diagnosis not present

## 2019-08-21 DIAGNOSIS — D509 Iron deficiency anemia, unspecified: Secondary | ICD-10-CM | POA: Diagnosis not present

## 2019-08-21 DIAGNOSIS — N2581 Secondary hyperparathyroidism of renal origin: Secondary | ICD-10-CM | POA: Diagnosis not present

## 2019-09-22 DIAGNOSIS — E1122 Type 2 diabetes mellitus with diabetic chronic kidney disease: Secondary | ICD-10-CM | POA: Diagnosis not present

## 2019-09-22 DIAGNOSIS — Z992 Dependence on renal dialysis: Secondary | ICD-10-CM | POA: Diagnosis not present

## 2019-09-22 DIAGNOSIS — N186 End stage renal disease: Secondary | ICD-10-CM | POA: Diagnosis not present

## 2019-09-23 DIAGNOSIS — E1129 Type 2 diabetes mellitus with other diabetic kidney complication: Secondary | ICD-10-CM | POA: Diagnosis not present

## 2019-09-23 DIAGNOSIS — N186 End stage renal disease: Secondary | ICD-10-CM | POA: Diagnosis not present

## 2019-09-23 DIAGNOSIS — N2581 Secondary hyperparathyroidism of renal origin: Secondary | ICD-10-CM | POA: Diagnosis not present

## 2019-09-23 DIAGNOSIS — D509 Iron deficiency anemia, unspecified: Secondary | ICD-10-CM | POA: Diagnosis not present

## 2019-09-23 DIAGNOSIS — D631 Anemia in chronic kidney disease: Secondary | ICD-10-CM | POA: Diagnosis not present

## 2019-09-23 DIAGNOSIS — Z992 Dependence on renal dialysis: Secondary | ICD-10-CM | POA: Diagnosis not present

## 2019-09-25 DIAGNOSIS — Z992 Dependence on renal dialysis: Secondary | ICD-10-CM | POA: Diagnosis not present

## 2019-09-25 DIAGNOSIS — E1129 Type 2 diabetes mellitus with other diabetic kidney complication: Secondary | ICD-10-CM | POA: Diagnosis not present

## 2019-09-25 DIAGNOSIS — N2581 Secondary hyperparathyroidism of renal origin: Secondary | ICD-10-CM | POA: Diagnosis not present

## 2019-09-25 DIAGNOSIS — N186 End stage renal disease: Secondary | ICD-10-CM | POA: Diagnosis not present

## 2019-09-25 DIAGNOSIS — D509 Iron deficiency anemia, unspecified: Secondary | ICD-10-CM | POA: Diagnosis not present

## 2019-09-25 DIAGNOSIS — D631 Anemia in chronic kidney disease: Secondary | ICD-10-CM | POA: Diagnosis not present

## 2019-09-27 DIAGNOSIS — N2581 Secondary hyperparathyroidism of renal origin: Secondary | ICD-10-CM | POA: Diagnosis not present

## 2019-09-27 DIAGNOSIS — E1129 Type 2 diabetes mellitus with other diabetic kidney complication: Secondary | ICD-10-CM | POA: Diagnosis not present

## 2019-09-27 DIAGNOSIS — D631 Anemia in chronic kidney disease: Secondary | ICD-10-CM | POA: Diagnosis not present

## 2019-09-27 DIAGNOSIS — N186 End stage renal disease: Secondary | ICD-10-CM | POA: Diagnosis not present

## 2019-09-27 DIAGNOSIS — D509 Iron deficiency anemia, unspecified: Secondary | ICD-10-CM | POA: Diagnosis not present

## 2019-09-27 DIAGNOSIS — Z992 Dependence on renal dialysis: Secondary | ICD-10-CM | POA: Diagnosis not present

## 2019-10-02 DIAGNOSIS — E1129 Type 2 diabetes mellitus with other diabetic kidney complication: Secondary | ICD-10-CM | POA: Diagnosis not present

## 2019-10-02 DIAGNOSIS — D631 Anemia in chronic kidney disease: Secondary | ICD-10-CM | POA: Diagnosis not present

## 2019-10-02 DIAGNOSIS — D509 Iron deficiency anemia, unspecified: Secondary | ICD-10-CM | POA: Diagnosis not present

## 2019-10-02 DIAGNOSIS — Z992 Dependence on renal dialysis: Secondary | ICD-10-CM | POA: Diagnosis not present

## 2019-10-02 DIAGNOSIS — N2581 Secondary hyperparathyroidism of renal origin: Secondary | ICD-10-CM | POA: Diagnosis not present

## 2019-10-02 DIAGNOSIS — N186 End stage renal disease: Secondary | ICD-10-CM | POA: Diagnosis not present

## 2019-10-06 DIAGNOSIS — N186 End stage renal disease: Secondary | ICD-10-CM | POA: Diagnosis not present

## 2019-10-06 DIAGNOSIS — D509 Iron deficiency anemia, unspecified: Secondary | ICD-10-CM | POA: Diagnosis not present

## 2019-10-06 DIAGNOSIS — E1129 Type 2 diabetes mellitus with other diabetic kidney complication: Secondary | ICD-10-CM | POA: Diagnosis not present

## 2019-10-06 DIAGNOSIS — N2581 Secondary hyperparathyroidism of renal origin: Secondary | ICD-10-CM | POA: Diagnosis not present

## 2019-10-06 DIAGNOSIS — Z992 Dependence on renal dialysis: Secondary | ICD-10-CM | POA: Diagnosis not present

## 2019-10-06 DIAGNOSIS — D631 Anemia in chronic kidney disease: Secondary | ICD-10-CM | POA: Diagnosis not present

## 2019-10-11 DIAGNOSIS — N2581 Secondary hyperparathyroidism of renal origin: Secondary | ICD-10-CM | POA: Diagnosis not present

## 2019-10-11 DIAGNOSIS — D509 Iron deficiency anemia, unspecified: Secondary | ICD-10-CM | POA: Diagnosis not present

## 2019-10-11 DIAGNOSIS — D631 Anemia in chronic kidney disease: Secondary | ICD-10-CM | POA: Diagnosis not present

## 2019-10-11 DIAGNOSIS — N186 End stage renal disease: Secondary | ICD-10-CM | POA: Diagnosis not present

## 2019-10-11 DIAGNOSIS — E1129 Type 2 diabetes mellitus with other diabetic kidney complication: Secondary | ICD-10-CM | POA: Diagnosis not present

## 2019-10-11 DIAGNOSIS — Z992 Dependence on renal dialysis: Secondary | ICD-10-CM | POA: Diagnosis not present

## 2019-10-13 DIAGNOSIS — D509 Iron deficiency anemia, unspecified: Secondary | ICD-10-CM | POA: Diagnosis not present

## 2019-10-13 DIAGNOSIS — Z992 Dependence on renal dialysis: Secondary | ICD-10-CM | POA: Diagnosis not present

## 2019-10-13 DIAGNOSIS — N186 End stage renal disease: Secondary | ICD-10-CM | POA: Diagnosis not present

## 2019-10-13 DIAGNOSIS — N2581 Secondary hyperparathyroidism of renal origin: Secondary | ICD-10-CM | POA: Diagnosis not present

## 2019-10-13 DIAGNOSIS — E1129 Type 2 diabetes mellitus with other diabetic kidney complication: Secondary | ICD-10-CM | POA: Diagnosis not present

## 2019-10-13 DIAGNOSIS — D631 Anemia in chronic kidney disease: Secondary | ICD-10-CM | POA: Diagnosis not present

## 2019-10-18 DIAGNOSIS — Z992 Dependence on renal dialysis: Secondary | ICD-10-CM | POA: Diagnosis not present

## 2019-10-18 DIAGNOSIS — E1129 Type 2 diabetes mellitus with other diabetic kidney complication: Secondary | ICD-10-CM | POA: Diagnosis not present

## 2019-10-18 DIAGNOSIS — D631 Anemia in chronic kidney disease: Secondary | ICD-10-CM | POA: Diagnosis not present

## 2019-10-18 DIAGNOSIS — N186 End stage renal disease: Secondary | ICD-10-CM | POA: Diagnosis not present

## 2019-10-18 DIAGNOSIS — D509 Iron deficiency anemia, unspecified: Secondary | ICD-10-CM | POA: Diagnosis not present

## 2019-10-18 DIAGNOSIS — N2581 Secondary hyperparathyroidism of renal origin: Secondary | ICD-10-CM | POA: Diagnosis not present

## 2019-10-20 DIAGNOSIS — N186 End stage renal disease: Secondary | ICD-10-CM | POA: Diagnosis not present

## 2019-10-20 DIAGNOSIS — D631 Anemia in chronic kidney disease: Secondary | ICD-10-CM | POA: Diagnosis not present

## 2019-10-20 DIAGNOSIS — N2581 Secondary hyperparathyroidism of renal origin: Secondary | ICD-10-CM | POA: Diagnosis not present

## 2019-10-20 DIAGNOSIS — E1129 Type 2 diabetes mellitus with other diabetic kidney complication: Secondary | ICD-10-CM | POA: Diagnosis not present

## 2019-10-20 DIAGNOSIS — Z992 Dependence on renal dialysis: Secondary | ICD-10-CM | POA: Diagnosis not present

## 2019-10-20 DIAGNOSIS — D509 Iron deficiency anemia, unspecified: Secondary | ICD-10-CM | POA: Diagnosis not present

## 2019-10-23 DIAGNOSIS — N186 End stage renal disease: Secondary | ICD-10-CM | POA: Diagnosis not present

## 2019-10-23 DIAGNOSIS — Z992 Dependence on renal dialysis: Secondary | ICD-10-CM | POA: Diagnosis not present

## 2019-10-23 DIAGNOSIS — E1122 Type 2 diabetes mellitus with diabetic chronic kidney disease: Secondary | ICD-10-CM | POA: Diagnosis not present

## 2019-10-25 DIAGNOSIS — Z992 Dependence on renal dialysis: Secondary | ICD-10-CM | POA: Diagnosis not present

## 2019-10-25 DIAGNOSIS — D631 Anemia in chronic kidney disease: Secondary | ICD-10-CM | POA: Diagnosis not present

## 2019-10-25 DIAGNOSIS — N186 End stage renal disease: Secondary | ICD-10-CM | POA: Diagnosis not present

## 2019-10-25 DIAGNOSIS — E1129 Type 2 diabetes mellitus with other diabetic kidney complication: Secondary | ICD-10-CM | POA: Diagnosis not present

## 2019-10-25 DIAGNOSIS — N2581 Secondary hyperparathyroidism of renal origin: Secondary | ICD-10-CM | POA: Diagnosis not present

## 2019-10-25 DIAGNOSIS — D509 Iron deficiency anemia, unspecified: Secondary | ICD-10-CM | POA: Diagnosis not present

## 2019-10-26 ENCOUNTER — Other Ambulatory Visit: Payer: Self-pay

## 2019-10-26 MED ORDER — OXYCODONE HCL 10 MG PO TABS: 20.0000 mg | ORAL_TABLET | Freq: Three times a day (TID) | ORAL | 0 refills | Status: DC | PRN

## 2019-10-27 DIAGNOSIS — E1129 Type 2 diabetes mellitus with other diabetic kidney complication: Secondary | ICD-10-CM | POA: Diagnosis not present

## 2019-10-27 DIAGNOSIS — D509 Iron deficiency anemia, unspecified: Secondary | ICD-10-CM | POA: Diagnosis not present

## 2019-10-27 DIAGNOSIS — Z992 Dependence on renal dialysis: Secondary | ICD-10-CM | POA: Diagnosis not present

## 2019-10-27 DIAGNOSIS — N186 End stage renal disease: Secondary | ICD-10-CM | POA: Diagnosis not present

## 2019-10-27 DIAGNOSIS — N2581 Secondary hyperparathyroidism of renal origin: Secondary | ICD-10-CM | POA: Diagnosis not present

## 2019-10-27 DIAGNOSIS — D631 Anemia in chronic kidney disease: Secondary | ICD-10-CM | POA: Diagnosis not present

## 2019-11-02 DIAGNOSIS — E1129 Type 2 diabetes mellitus with other diabetic kidney complication: Secondary | ICD-10-CM | POA: Diagnosis not present

## 2019-11-02 DIAGNOSIS — N2581 Secondary hyperparathyroidism of renal origin: Secondary | ICD-10-CM | POA: Diagnosis not present

## 2019-11-02 DIAGNOSIS — Z992 Dependence on renal dialysis: Secondary | ICD-10-CM | POA: Diagnosis not present

## 2019-11-02 DIAGNOSIS — D631 Anemia in chronic kidney disease: Secondary | ICD-10-CM | POA: Diagnosis not present

## 2019-11-02 DIAGNOSIS — D509 Iron deficiency anemia, unspecified: Secondary | ICD-10-CM | POA: Diagnosis not present

## 2019-11-02 DIAGNOSIS — N186 End stage renal disease: Secondary | ICD-10-CM | POA: Diagnosis not present

## 2019-11-03 DIAGNOSIS — N2581 Secondary hyperparathyroidism of renal origin: Secondary | ICD-10-CM | POA: Diagnosis not present

## 2019-11-03 DIAGNOSIS — Z992 Dependence on renal dialysis: Secondary | ICD-10-CM | POA: Diagnosis not present

## 2019-11-03 DIAGNOSIS — D509 Iron deficiency anemia, unspecified: Secondary | ICD-10-CM | POA: Diagnosis not present

## 2019-11-03 DIAGNOSIS — E1129 Type 2 diabetes mellitus with other diabetic kidney complication: Secondary | ICD-10-CM | POA: Diagnosis not present

## 2019-11-03 DIAGNOSIS — D631 Anemia in chronic kidney disease: Secondary | ICD-10-CM | POA: Diagnosis not present

## 2019-11-03 DIAGNOSIS — N186 End stage renal disease: Secondary | ICD-10-CM | POA: Diagnosis not present

## 2019-11-06 DIAGNOSIS — E1129 Type 2 diabetes mellitus with other diabetic kidney complication: Secondary | ICD-10-CM | POA: Diagnosis not present

## 2019-11-06 DIAGNOSIS — N2581 Secondary hyperparathyroidism of renal origin: Secondary | ICD-10-CM | POA: Diagnosis not present

## 2019-11-06 DIAGNOSIS — D509 Iron deficiency anemia, unspecified: Secondary | ICD-10-CM | POA: Diagnosis not present

## 2019-11-06 DIAGNOSIS — Z992 Dependence on renal dialysis: Secondary | ICD-10-CM | POA: Diagnosis not present

## 2019-11-06 DIAGNOSIS — I69321 Dysphasia following cerebral infarction: Secondary | ICD-10-CM | POA: Insufficient documentation

## 2019-11-06 DIAGNOSIS — M47816 Spondylosis without myelopathy or radiculopathy, lumbar region: Secondary | ICD-10-CM | POA: Insufficient documentation

## 2019-11-06 DIAGNOSIS — D631 Anemia in chronic kidney disease: Secondary | ICD-10-CM | POA: Diagnosis not present

## 2019-11-06 DIAGNOSIS — N186 End stage renal disease: Secondary | ICD-10-CM | POA: Diagnosis not present

## 2019-11-07 ENCOUNTER — Encounter: Payer: Self-pay | Admitting: Legal Medicine

## 2019-11-07 ENCOUNTER — Ambulatory Visit: Payer: Medicare Other | Admitting: Legal Medicine

## 2019-11-07 IMAGING — CT CT CHEST W/O CM
2 of 3 series · 15 of 36 positions shown, 18 images · non-contrast
Comparison: Radiographs of same day.  CT scan June 21, 2013.

CLINICAL DATA: Chest pain, shortness of breath, cough.

EXAM:
CT CHEST WITHOUT CONTRAST
TECHNIQUE: Multidetector CT imaging of the chest was performed following the
standard protocol without IV contrast.

[Series 4: thorax 2.0 · axial · 0.74mm/px · z∈[+1139,+1369]mm · 12 of 135 slices shown, 15 images]
[im 10/135  mediastinal]
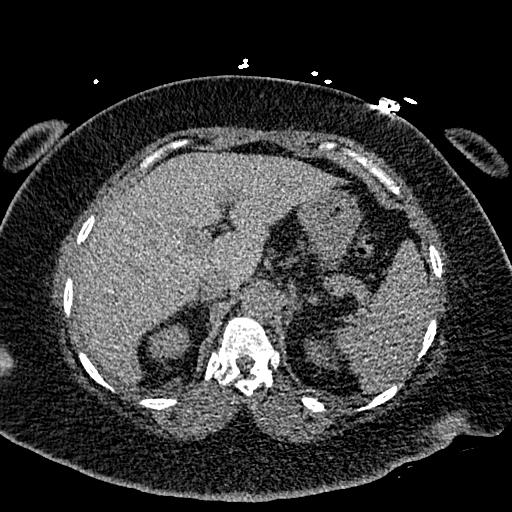
[im 10/135  lung]
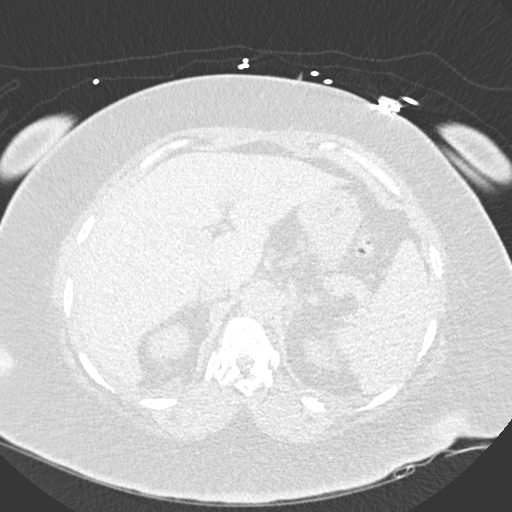
[im 20/135  lung]
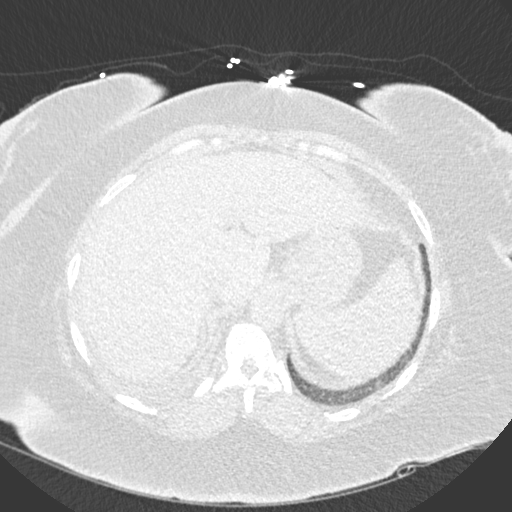
[im 30/135  lung]
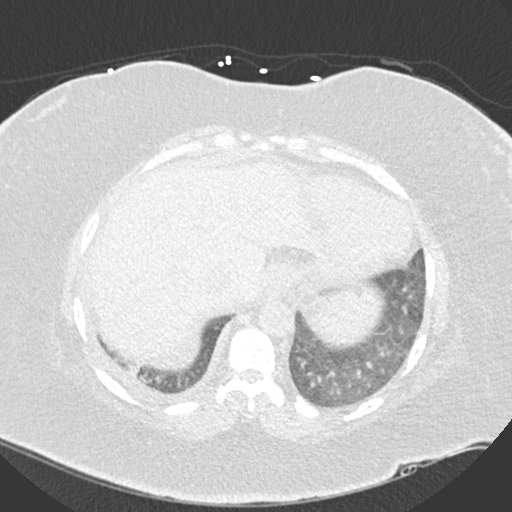
[im 40/135  lung]
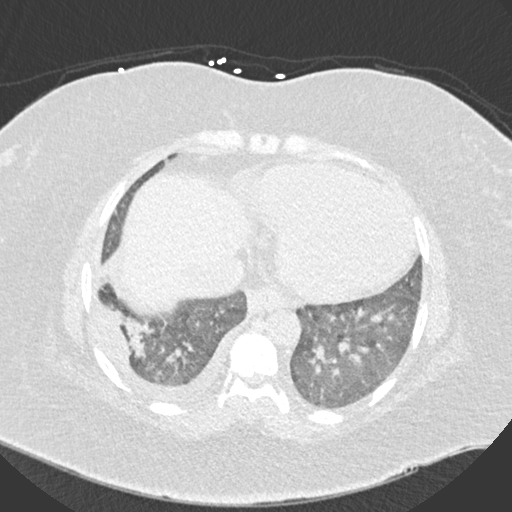
[im 50/135  mediastinal]
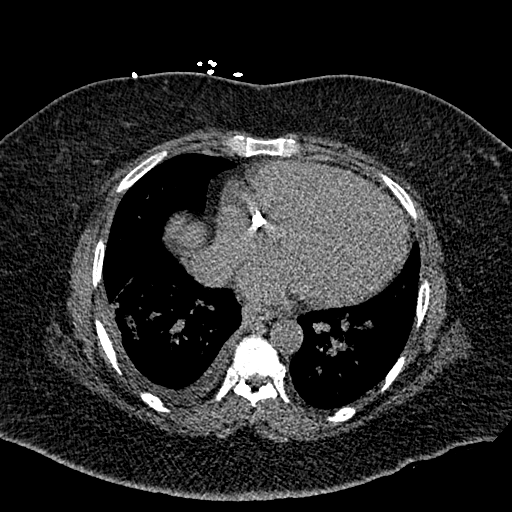
[im 50/135  lung]
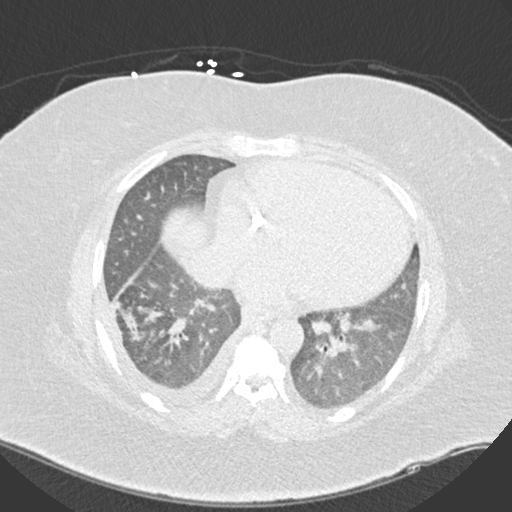
[im 60/135  lung]
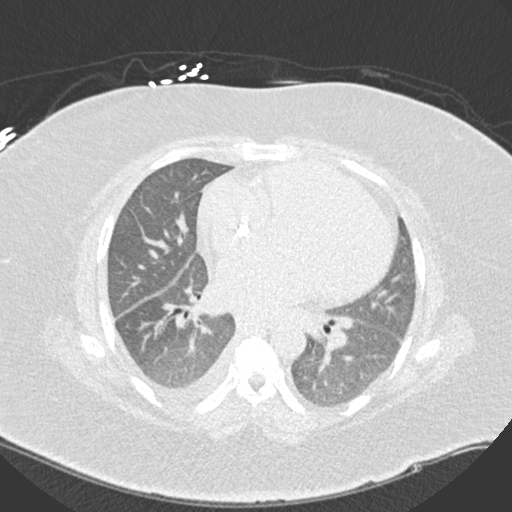
[im 75/135  lung]
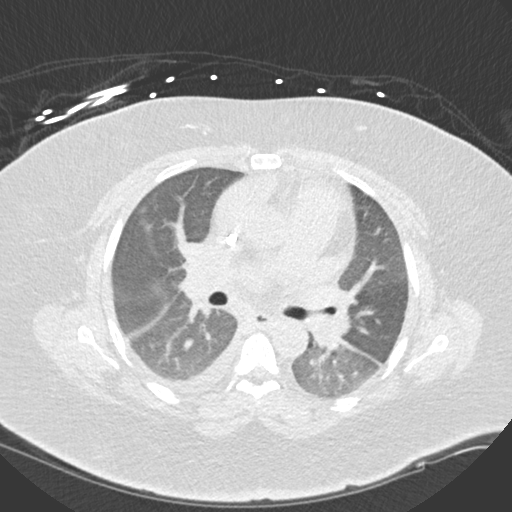
[im 85/135  lung]
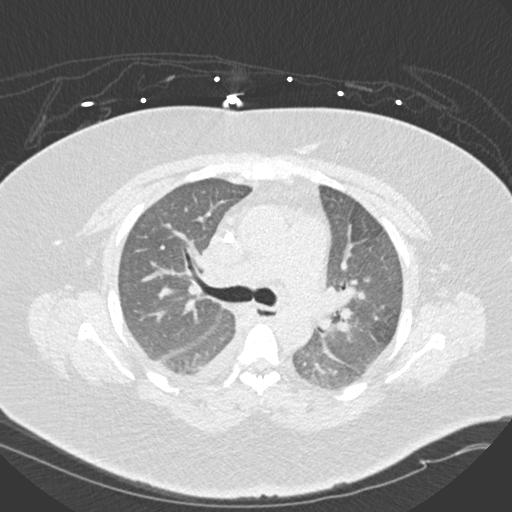
[im 95/135  mediastinal]
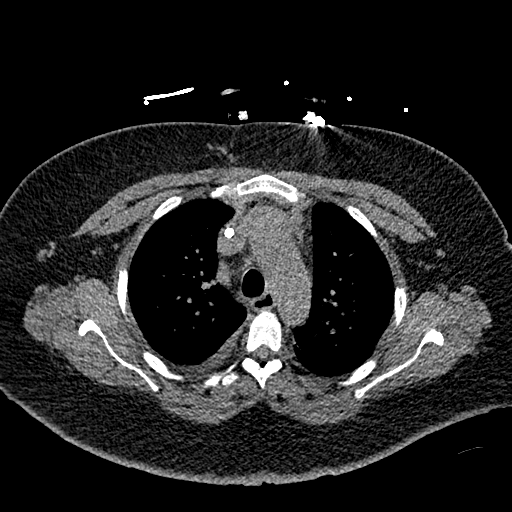
[im 95/135  lung]
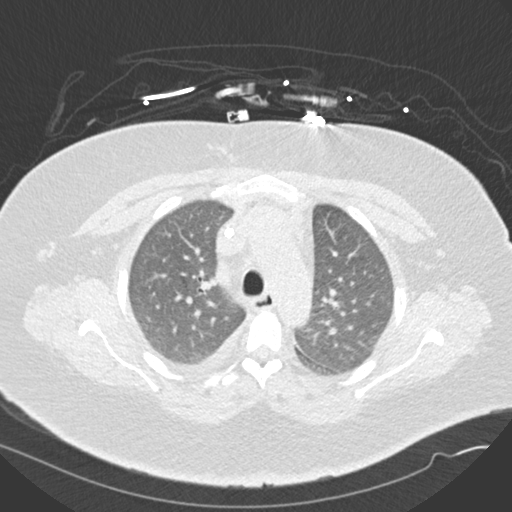
[im 105/135  lung]
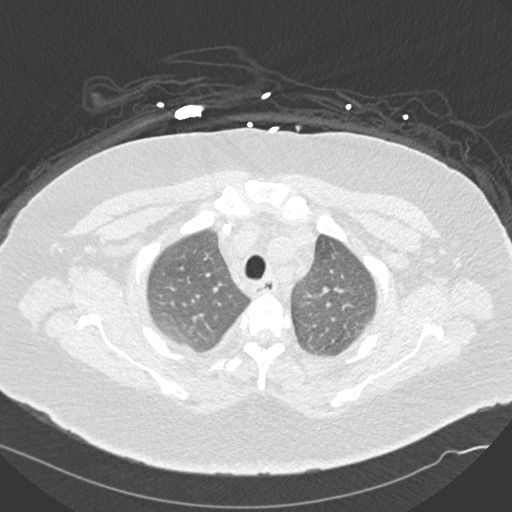
[im 115/135  lung]
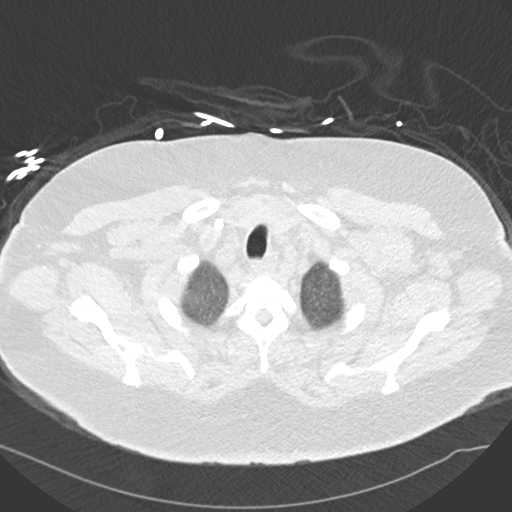
[im 125/135  lung]
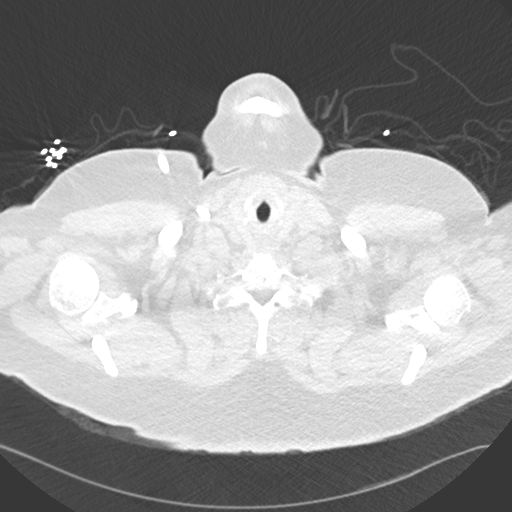

[Series 6: coronal · coronal · 0.55mm/px · 3 of 101 slices shown]
[im 21/101  lung]
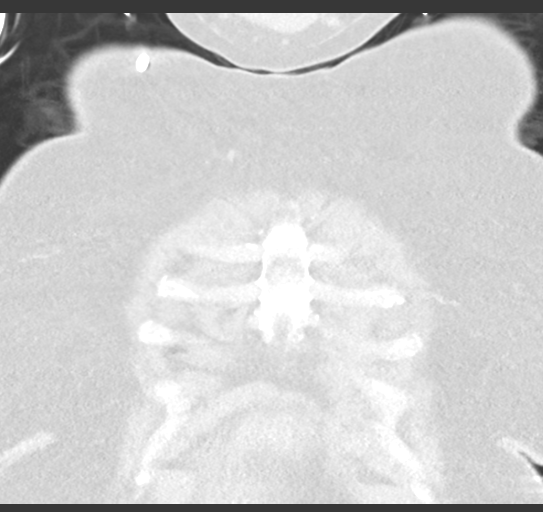
[im 41/101  lung]
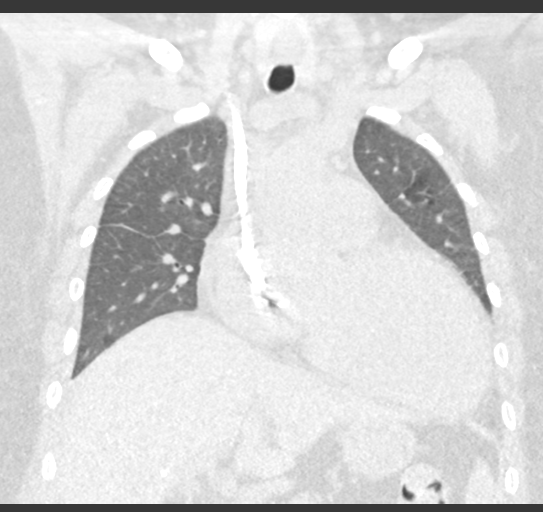
[im 61/101  lung]
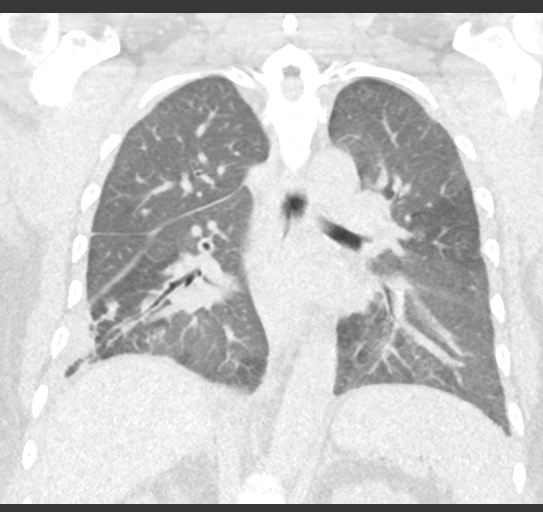

[15 of 36 positions shown; findings below may reference images not displayed]

FINDINGS: Cardiovascular: There is no evidence of thoracic aortic aneurysm.
Mild cardiomegaly is noted. No pericardial effusion is noted. Right
internal jugular dialysis catheter is noted with distal tip near
tricuspid valve.

Mediastinum/Nodes: No enlarged mediastinal or axillary lymph nodes.
Thyroid gland, trachea, and esophagus demonstrate no significant
findings.

Lungs/Pleura: No pneumothorax is noted. Left lung is clear. Mild
right pleural effusion is noted with adjacent subsegmental
atelectasis.

Upper Abdomen: No acute abnormality.

Musculoskeletal: No chest wall mass or suspicious bone lesions
identified.
IMPRESSION: Mild right pleural effusion is noted with adjacent subsegmental
atelectasis of the right lower lobe.

Right internal jugular dialysis catheter is noted with distal tip
near tricuspid valve.

## 2019-11-10 DIAGNOSIS — D631 Anemia in chronic kidney disease: Secondary | ICD-10-CM | POA: Diagnosis not present

## 2019-11-10 DIAGNOSIS — Z992 Dependence on renal dialysis: Secondary | ICD-10-CM | POA: Diagnosis not present

## 2019-11-10 DIAGNOSIS — N186 End stage renal disease: Secondary | ICD-10-CM | POA: Diagnosis not present

## 2019-11-10 DIAGNOSIS — D509 Iron deficiency anemia, unspecified: Secondary | ICD-10-CM | POA: Diagnosis not present

## 2019-11-10 DIAGNOSIS — E1129 Type 2 diabetes mellitus with other diabetic kidney complication: Secondary | ICD-10-CM | POA: Diagnosis not present

## 2019-11-10 DIAGNOSIS — N2581 Secondary hyperparathyroidism of renal origin: Secondary | ICD-10-CM | POA: Diagnosis not present

## 2019-11-13 DIAGNOSIS — N186 End stage renal disease: Secondary | ICD-10-CM | POA: Diagnosis not present

## 2019-11-13 DIAGNOSIS — D631 Anemia in chronic kidney disease: Secondary | ICD-10-CM | POA: Diagnosis not present

## 2019-11-13 DIAGNOSIS — Z992 Dependence on renal dialysis: Secondary | ICD-10-CM | POA: Diagnosis not present

## 2019-11-13 DIAGNOSIS — E1129 Type 2 diabetes mellitus with other diabetic kidney complication: Secondary | ICD-10-CM | POA: Diagnosis not present

## 2019-11-13 DIAGNOSIS — D509 Iron deficiency anemia, unspecified: Secondary | ICD-10-CM | POA: Diagnosis not present

## 2019-11-13 DIAGNOSIS — N2581 Secondary hyperparathyroidism of renal origin: Secondary | ICD-10-CM | POA: Diagnosis not present

## 2019-11-15 DIAGNOSIS — D509 Iron deficiency anemia, unspecified: Secondary | ICD-10-CM | POA: Diagnosis not present

## 2019-11-15 DIAGNOSIS — N186 End stage renal disease: Secondary | ICD-10-CM | POA: Diagnosis not present

## 2019-11-15 DIAGNOSIS — N2581 Secondary hyperparathyroidism of renal origin: Secondary | ICD-10-CM | POA: Diagnosis not present

## 2019-11-15 DIAGNOSIS — E1129 Type 2 diabetes mellitus with other diabetic kidney complication: Secondary | ICD-10-CM | POA: Diagnosis not present

## 2019-11-15 DIAGNOSIS — Z992 Dependence on renal dialysis: Secondary | ICD-10-CM | POA: Diagnosis not present

## 2019-11-15 DIAGNOSIS — D631 Anemia in chronic kidney disease: Secondary | ICD-10-CM | POA: Diagnosis not present

## 2019-11-20 DIAGNOSIS — Z992 Dependence on renal dialysis: Secondary | ICD-10-CM | POA: Diagnosis not present

## 2019-11-20 DIAGNOSIS — E1122 Type 2 diabetes mellitus with diabetic chronic kidney disease: Secondary | ICD-10-CM | POA: Diagnosis not present

## 2019-11-20 DIAGNOSIS — D631 Anemia in chronic kidney disease: Secondary | ICD-10-CM | POA: Diagnosis not present

## 2019-11-20 DIAGNOSIS — N186 End stage renal disease: Secondary | ICD-10-CM | POA: Diagnosis not present

## 2019-11-20 DIAGNOSIS — D509 Iron deficiency anemia, unspecified: Secondary | ICD-10-CM | POA: Diagnosis not present

## 2019-11-20 DIAGNOSIS — N2581 Secondary hyperparathyroidism of renal origin: Secondary | ICD-10-CM | POA: Diagnosis not present

## 2019-11-22 DIAGNOSIS — Z992 Dependence on renal dialysis: Secondary | ICD-10-CM | POA: Diagnosis not present

## 2019-11-22 DIAGNOSIS — D509 Iron deficiency anemia, unspecified: Secondary | ICD-10-CM | POA: Diagnosis not present

## 2019-11-22 DIAGNOSIS — N186 End stage renal disease: Secondary | ICD-10-CM | POA: Diagnosis not present

## 2019-11-22 DIAGNOSIS — N2581 Secondary hyperparathyroidism of renal origin: Secondary | ICD-10-CM | POA: Diagnosis not present

## 2019-11-22 DIAGNOSIS — D631 Anemia in chronic kidney disease: Secondary | ICD-10-CM | POA: Diagnosis not present

## 2019-11-27 DIAGNOSIS — Z992 Dependence on renal dialysis: Secondary | ICD-10-CM | POA: Diagnosis not present

## 2019-11-27 DIAGNOSIS — N186 End stage renal disease: Secondary | ICD-10-CM | POA: Diagnosis not present

## 2019-11-27 DIAGNOSIS — D631 Anemia in chronic kidney disease: Secondary | ICD-10-CM | POA: Diagnosis not present

## 2019-11-27 DIAGNOSIS — D509 Iron deficiency anemia, unspecified: Secondary | ICD-10-CM | POA: Diagnosis not present

## 2019-11-27 DIAGNOSIS — N2581 Secondary hyperparathyroidism of renal origin: Secondary | ICD-10-CM | POA: Diagnosis not present

## 2019-11-29 DIAGNOSIS — Z992 Dependence on renal dialysis: Secondary | ICD-10-CM | POA: Diagnosis not present

## 2019-11-29 DIAGNOSIS — D509 Iron deficiency anemia, unspecified: Secondary | ICD-10-CM | POA: Diagnosis not present

## 2019-11-29 DIAGNOSIS — N2581 Secondary hyperparathyroidism of renal origin: Secondary | ICD-10-CM | POA: Diagnosis not present

## 2019-11-29 DIAGNOSIS — N186 End stage renal disease: Secondary | ICD-10-CM | POA: Diagnosis not present

## 2019-11-29 DIAGNOSIS — D631 Anemia in chronic kidney disease: Secondary | ICD-10-CM | POA: Diagnosis not present

## 2019-11-30 DIAGNOSIS — M79604 Pain in right leg: Secondary | ICD-10-CM | POA: Diagnosis not present

## 2019-11-30 DIAGNOSIS — R52 Pain, unspecified: Secondary | ICD-10-CM | POA: Diagnosis not present

## 2019-12-01 ENCOUNTER — Ambulatory Visit: Payer: Self-pay | Admitting: Nurse Practitioner

## 2019-12-04 DIAGNOSIS — N186 End stage renal disease: Secondary | ICD-10-CM | POA: Diagnosis not present

## 2019-12-04 DIAGNOSIS — N2581 Secondary hyperparathyroidism of renal origin: Secondary | ICD-10-CM | POA: Diagnosis not present

## 2019-12-04 DIAGNOSIS — D631 Anemia in chronic kidney disease: Secondary | ICD-10-CM | POA: Diagnosis not present

## 2019-12-04 DIAGNOSIS — D509 Iron deficiency anemia, unspecified: Secondary | ICD-10-CM | POA: Diagnosis not present

## 2019-12-04 DIAGNOSIS — Z992 Dependence on renal dialysis: Secondary | ICD-10-CM | POA: Diagnosis not present

## 2019-12-08 DIAGNOSIS — S82891A Other fracture of right lower leg, initial encounter for closed fracture: Secondary | ICD-10-CM | POA: Diagnosis not present

## 2019-12-08 DIAGNOSIS — R52 Pain, unspecified: Secondary | ICD-10-CM | POA: Diagnosis not present

## 2019-12-08 DIAGNOSIS — S80211A Abrasion, right knee, initial encounter: Secondary | ICD-10-CM | POA: Diagnosis not present

## 2019-12-08 DIAGNOSIS — D509 Iron deficiency anemia, unspecified: Secondary | ICD-10-CM | POA: Diagnosis not present

## 2019-12-08 DIAGNOSIS — N186 End stage renal disease: Secondary | ICD-10-CM | POA: Diagnosis not present

## 2019-12-08 DIAGNOSIS — R0902 Hypoxemia: Secondary | ICD-10-CM | POA: Diagnosis not present

## 2019-12-08 DIAGNOSIS — Z992 Dependence on renal dialysis: Secondary | ICD-10-CM | POA: Diagnosis not present

## 2019-12-08 DIAGNOSIS — W19XXXA Unspecified fall, initial encounter: Secondary | ICD-10-CM | POA: Diagnosis not present

## 2019-12-08 DIAGNOSIS — N2581 Secondary hyperparathyroidism of renal origin: Secondary | ICD-10-CM | POA: Diagnosis not present

## 2019-12-08 DIAGNOSIS — D631 Anemia in chronic kidney disease: Secondary | ICD-10-CM | POA: Diagnosis not present

## 2019-12-13 DIAGNOSIS — N186 End stage renal disease: Secondary | ICD-10-CM | POA: Diagnosis not present

## 2019-12-13 DIAGNOSIS — N2581 Secondary hyperparathyroidism of renal origin: Secondary | ICD-10-CM | POA: Diagnosis not present

## 2019-12-13 DIAGNOSIS — D509 Iron deficiency anemia, unspecified: Secondary | ICD-10-CM | POA: Diagnosis not present

## 2019-12-13 DIAGNOSIS — Z992 Dependence on renal dialysis: Secondary | ICD-10-CM | POA: Diagnosis not present

## 2019-12-13 DIAGNOSIS — D631 Anemia in chronic kidney disease: Secondary | ICD-10-CM | POA: Diagnosis not present

## 2019-12-15 DIAGNOSIS — D631 Anemia in chronic kidney disease: Secondary | ICD-10-CM | POA: Diagnosis not present

## 2019-12-15 DIAGNOSIS — N2581 Secondary hyperparathyroidism of renal origin: Secondary | ICD-10-CM | POA: Diagnosis not present

## 2019-12-15 DIAGNOSIS — Z992 Dependence on renal dialysis: Secondary | ICD-10-CM | POA: Diagnosis not present

## 2019-12-15 DIAGNOSIS — D509 Iron deficiency anemia, unspecified: Secondary | ICD-10-CM | POA: Diagnosis not present

## 2019-12-15 DIAGNOSIS — N186 End stage renal disease: Secondary | ICD-10-CM | POA: Diagnosis not present

## 2019-12-20 DIAGNOSIS — N186 End stage renal disease: Secondary | ICD-10-CM | POA: Diagnosis not present

## 2019-12-20 DIAGNOSIS — D631 Anemia in chronic kidney disease: Secondary | ICD-10-CM | POA: Diagnosis not present

## 2019-12-20 DIAGNOSIS — Z992 Dependence on renal dialysis: Secondary | ICD-10-CM | POA: Diagnosis not present

## 2019-12-20 DIAGNOSIS — N2581 Secondary hyperparathyroidism of renal origin: Secondary | ICD-10-CM | POA: Diagnosis not present

## 2019-12-20 DIAGNOSIS — D509 Iron deficiency anemia, unspecified: Secondary | ICD-10-CM | POA: Diagnosis not present

## 2019-12-21 DIAGNOSIS — N186 End stage renal disease: Secondary | ICD-10-CM | POA: Diagnosis not present

## 2019-12-21 DIAGNOSIS — Z992 Dependence on renal dialysis: Secondary | ICD-10-CM | POA: Diagnosis not present

## 2019-12-21 DIAGNOSIS — E1122 Type 2 diabetes mellitus with diabetic chronic kidney disease: Secondary | ICD-10-CM | POA: Diagnosis not present

## 2019-12-22 DIAGNOSIS — Z992 Dependence on renal dialysis: Secondary | ICD-10-CM | POA: Diagnosis not present

## 2019-12-22 DIAGNOSIS — N186 End stage renal disease: Secondary | ICD-10-CM | POA: Diagnosis not present

## 2019-12-22 DIAGNOSIS — D631 Anemia in chronic kidney disease: Secondary | ICD-10-CM | POA: Diagnosis not present

## 2019-12-22 DIAGNOSIS — N2581 Secondary hyperparathyroidism of renal origin: Secondary | ICD-10-CM | POA: Diagnosis not present

## 2019-12-22 DIAGNOSIS — D509 Iron deficiency anemia, unspecified: Secondary | ICD-10-CM | POA: Diagnosis not present

## 2019-12-27 DIAGNOSIS — D631 Anemia in chronic kidney disease: Secondary | ICD-10-CM | POA: Diagnosis not present

## 2019-12-27 DIAGNOSIS — D509 Iron deficiency anemia, unspecified: Secondary | ICD-10-CM | POA: Diagnosis not present

## 2019-12-27 DIAGNOSIS — Z992 Dependence on renal dialysis: Secondary | ICD-10-CM | POA: Diagnosis not present

## 2019-12-27 DIAGNOSIS — N2581 Secondary hyperparathyroidism of renal origin: Secondary | ICD-10-CM | POA: Diagnosis not present

## 2019-12-27 DIAGNOSIS — N186 End stage renal disease: Secondary | ICD-10-CM | POA: Diagnosis not present

## 2019-12-29 DIAGNOSIS — Z992 Dependence on renal dialysis: Secondary | ICD-10-CM | POA: Diagnosis not present

## 2019-12-29 DIAGNOSIS — E1122 Type 2 diabetes mellitus with diabetic chronic kidney disease: Secondary | ICD-10-CM | POA: Diagnosis not present

## 2019-12-29 DIAGNOSIS — G894 Chronic pain syndrome: Secondary | ICD-10-CM | POA: Diagnosis not present

## 2019-12-29 DIAGNOSIS — G4700 Insomnia, unspecified: Secondary | ICD-10-CM | POA: Diagnosis not present

## 2019-12-29 DIAGNOSIS — I739 Peripheral vascular disease, unspecified: Secondary | ICD-10-CM | POA: Diagnosis not present

## 2019-12-29 DIAGNOSIS — R110 Nausea: Secondary | ICD-10-CM | POA: Diagnosis not present

## 2020-01-01 DIAGNOSIS — N186 End stage renal disease: Secondary | ICD-10-CM | POA: Diagnosis not present

## 2020-01-01 DIAGNOSIS — N2581 Secondary hyperparathyroidism of renal origin: Secondary | ICD-10-CM | POA: Diagnosis not present

## 2020-01-01 DIAGNOSIS — D509 Iron deficiency anemia, unspecified: Secondary | ICD-10-CM | POA: Diagnosis not present

## 2020-01-01 DIAGNOSIS — D631 Anemia in chronic kidney disease: Secondary | ICD-10-CM | POA: Diagnosis not present

## 2020-01-01 DIAGNOSIS — Z992 Dependence on renal dialysis: Secondary | ICD-10-CM | POA: Diagnosis not present

## 2020-01-05 DIAGNOSIS — N2581 Secondary hyperparathyroidism of renal origin: Secondary | ICD-10-CM | POA: Diagnosis not present

## 2020-01-05 DIAGNOSIS — D509 Iron deficiency anemia, unspecified: Secondary | ICD-10-CM | POA: Diagnosis not present

## 2020-01-05 DIAGNOSIS — N186 End stage renal disease: Secondary | ICD-10-CM | POA: Diagnosis not present

## 2020-01-05 DIAGNOSIS — Z992 Dependence on renal dialysis: Secondary | ICD-10-CM | POA: Diagnosis not present

## 2020-01-05 DIAGNOSIS — D631 Anemia in chronic kidney disease: Secondary | ICD-10-CM | POA: Diagnosis not present

## 2020-01-08 DIAGNOSIS — D631 Anemia in chronic kidney disease: Secondary | ICD-10-CM | POA: Diagnosis not present

## 2020-01-08 DIAGNOSIS — N186 End stage renal disease: Secondary | ICD-10-CM | POA: Diagnosis not present

## 2020-01-08 DIAGNOSIS — D509 Iron deficiency anemia, unspecified: Secondary | ICD-10-CM | POA: Diagnosis not present

## 2020-01-08 DIAGNOSIS — N2581 Secondary hyperparathyroidism of renal origin: Secondary | ICD-10-CM | POA: Diagnosis not present

## 2020-01-08 DIAGNOSIS — Z992 Dependence on renal dialysis: Secondary | ICD-10-CM | POA: Diagnosis not present

## 2020-01-12 DIAGNOSIS — N186 End stage renal disease: Secondary | ICD-10-CM | POA: Diagnosis not present

## 2020-01-12 DIAGNOSIS — Z992 Dependence on renal dialysis: Secondary | ICD-10-CM | POA: Diagnosis not present

## 2020-01-12 DIAGNOSIS — D631 Anemia in chronic kidney disease: Secondary | ICD-10-CM | POA: Diagnosis not present

## 2020-01-12 DIAGNOSIS — D509 Iron deficiency anemia, unspecified: Secondary | ICD-10-CM | POA: Diagnosis not present

## 2020-01-12 DIAGNOSIS — N2581 Secondary hyperparathyroidism of renal origin: Secondary | ICD-10-CM | POA: Diagnosis not present

## 2020-01-15 DIAGNOSIS — R252 Cramp and spasm: Secondary | ICD-10-CM | POA: Diagnosis not present

## 2020-01-15 DIAGNOSIS — D631 Anemia in chronic kidney disease: Secondary | ICD-10-CM | POA: Diagnosis not present

## 2020-01-15 DIAGNOSIS — R52 Pain, unspecified: Secondary | ICD-10-CM | POA: Diagnosis not present

## 2020-01-15 DIAGNOSIS — N2581 Secondary hyperparathyroidism of renal origin: Secondary | ICD-10-CM | POA: Diagnosis not present

## 2020-01-15 DIAGNOSIS — R531 Weakness: Secondary | ICD-10-CM | POA: Diagnosis not present

## 2020-01-15 DIAGNOSIS — Z992 Dependence on renal dialysis: Secondary | ICD-10-CM | POA: Diagnosis not present

## 2020-01-15 DIAGNOSIS — G4762 Sleep related leg cramps: Secondary | ICD-10-CM | POA: Diagnosis not present

## 2020-01-15 DIAGNOSIS — R0902 Hypoxemia: Secondary | ICD-10-CM | POA: Diagnosis not present

## 2020-01-15 DIAGNOSIS — D509 Iron deficiency anemia, unspecified: Secondary | ICD-10-CM | POA: Diagnosis not present

## 2020-01-15 DIAGNOSIS — N186 End stage renal disease: Secondary | ICD-10-CM | POA: Diagnosis not present

## 2020-01-17 DIAGNOSIS — Z79891 Long term (current) use of opiate analgesic: Secondary | ICD-10-CM | POA: Diagnosis not present

## 2020-01-17 DIAGNOSIS — M546 Pain in thoracic spine: Secondary | ICD-10-CM | POA: Diagnosis not present

## 2020-01-17 DIAGNOSIS — G546 Phantom limb syndrome with pain: Secondary | ICD-10-CM | POA: Diagnosis not present

## 2020-01-17 DIAGNOSIS — M542 Cervicalgia: Secondary | ICD-10-CM | POA: Diagnosis not present

## 2020-01-17 DIAGNOSIS — R69 Illness, unspecified: Secondary | ICD-10-CM | POA: Diagnosis not present

## 2020-01-17 DIAGNOSIS — M25511 Pain in right shoulder: Secondary | ICD-10-CM | POA: Diagnosis not present

## 2020-01-17 DIAGNOSIS — M545 Low back pain: Secondary | ICD-10-CM | POA: Diagnosis not present

## 2020-01-17 DIAGNOSIS — R29898 Other symptoms and signs involving the musculoskeletal system: Secondary | ICD-10-CM | POA: Diagnosis not present

## 2020-01-17 DIAGNOSIS — G894 Chronic pain syndrome: Secondary | ICD-10-CM | POA: Diagnosis not present

## 2020-01-17 DIAGNOSIS — Z1389 Encounter for screening for other disorder: Secondary | ICD-10-CM | POA: Diagnosis not present

## 2020-01-17 DIAGNOSIS — R52 Pain, unspecified: Secondary | ICD-10-CM | POA: Diagnosis not present

## 2020-01-19 ENCOUNTER — Inpatient Hospital Stay
Admission: AD | Admit: 2020-01-19 | Payer: Medicare Other | Source: Other Acute Inpatient Hospital | Admitting: Internal Medicine

## 2020-01-19 DIAGNOSIS — M79652 Pain in left thigh: Secondary | ICD-10-CM | POA: Diagnosis not present

## 2020-01-19 DIAGNOSIS — J9601 Acute respiratory failure with hypoxia: Secondary | ICD-10-CM | POA: Diagnosis present

## 2020-01-19 DIAGNOSIS — R7989 Other specified abnormal findings of blood chemistry: Secondary | ICD-10-CM | POA: Diagnosis not present

## 2020-01-19 DIAGNOSIS — Z794 Long term (current) use of insulin: Secondary | ICD-10-CM | POA: Diagnosis not present

## 2020-01-19 DIAGNOSIS — J1282 Pneumonia due to coronavirus disease 2019: Secondary | ICD-10-CM | POA: Diagnosis present

## 2020-01-19 DIAGNOSIS — Z79899 Other long term (current) drug therapy: Secondary | ICD-10-CM | POA: Diagnosis not present

## 2020-01-19 DIAGNOSIS — N2581 Secondary hyperparathyroidism of renal origin: Secondary | ICD-10-CM | POA: Diagnosis not present

## 2020-01-19 DIAGNOSIS — E119 Type 2 diabetes mellitus without complications: Secondary | ICD-10-CM | POA: Diagnosis not present

## 2020-01-19 DIAGNOSIS — Z992 Dependence on renal dialysis: Secondary | ICD-10-CM | POA: Diagnosis not present

## 2020-01-19 DIAGNOSIS — M79604 Pain in right leg: Secondary | ICD-10-CM | POA: Diagnosis not present

## 2020-01-19 DIAGNOSIS — E1122 Type 2 diabetes mellitus with diabetic chronic kidney disease: Secondary | ICD-10-CM | POA: Diagnosis present

## 2020-01-19 DIAGNOSIS — Z6841 Body Mass Index (BMI) 40.0 and over, adult: Secondary | ICD-10-CM | POA: Diagnosis not present

## 2020-01-19 DIAGNOSIS — D631 Anemia in chronic kidney disease: Secondary | ICD-10-CM | POA: Diagnosis present

## 2020-01-19 DIAGNOSIS — Z7982 Long term (current) use of aspirin: Secondary | ICD-10-CM | POA: Diagnosis not present

## 2020-01-19 DIAGNOSIS — R52 Pain, unspecified: Secondary | ICD-10-CM | POA: Diagnosis not present

## 2020-01-19 DIAGNOSIS — K219 Gastro-esophageal reflux disease without esophagitis: Secondary | ICD-10-CM | POA: Diagnosis not present

## 2020-01-19 DIAGNOSIS — E875 Hyperkalemia: Secondary | ICD-10-CM | POA: Diagnosis present

## 2020-01-19 DIAGNOSIS — R509 Fever, unspecified: Secondary | ICD-10-CM | POA: Diagnosis not present

## 2020-01-19 DIAGNOSIS — E785 Hyperlipidemia, unspecified: Secondary | ICD-10-CM | POA: Diagnosis present

## 2020-01-19 DIAGNOSIS — R0602 Shortness of breath: Secondary | ICD-10-CM | POA: Diagnosis not present

## 2020-01-19 DIAGNOSIS — R531 Weakness: Secondary | ICD-10-CM | POA: Diagnosis not present

## 2020-01-19 DIAGNOSIS — E1129 Type 2 diabetes mellitus with other diabetic kidney complication: Secondary | ICD-10-CM | POA: Diagnosis not present

## 2020-01-19 DIAGNOSIS — Z87891 Personal history of nicotine dependence: Secondary | ICD-10-CM | POA: Diagnosis not present

## 2020-01-19 DIAGNOSIS — I5032 Chronic diastolic (congestive) heart failure: Secondary | ICD-10-CM | POA: Diagnosis present

## 2020-01-19 DIAGNOSIS — I69322 Dysarthria following cerebral infarction: Secondary | ICD-10-CM | POA: Diagnosis not present

## 2020-01-19 DIAGNOSIS — Z79891 Long term (current) use of opiate analgesic: Secondary | ICD-10-CM | POA: Diagnosis not present

## 2020-01-19 DIAGNOSIS — R05 Cough: Secondary | ICD-10-CM | POA: Diagnosis not present

## 2020-01-19 DIAGNOSIS — D649 Anemia, unspecified: Secondary | ICD-10-CM | POA: Diagnosis not present

## 2020-01-19 DIAGNOSIS — Z9981 Dependence on supplemental oxygen: Secondary | ICD-10-CM | POA: Diagnosis not present

## 2020-01-19 DIAGNOSIS — I480 Paroxysmal atrial fibrillation: Secondary | ICD-10-CM | POA: Diagnosis present

## 2020-01-19 DIAGNOSIS — D509 Iron deficiency anemia, unspecified: Secondary | ICD-10-CM | POA: Diagnosis not present

## 2020-01-19 DIAGNOSIS — I120 Hypertensive chronic kidney disease with stage 5 chronic kidney disease or end stage renal disease: Secondary | ICD-10-CM | POA: Diagnosis not present

## 2020-01-19 DIAGNOSIS — M549 Dorsalgia, unspecified: Secondary | ICD-10-CM | POA: Diagnosis not present

## 2020-01-19 DIAGNOSIS — M62838 Other muscle spasm: Secondary | ICD-10-CM | POA: Diagnosis present

## 2020-01-19 DIAGNOSIS — I10 Essential (primary) hypertension: Secondary | ICD-10-CM | POA: Diagnosis not present

## 2020-01-19 DIAGNOSIS — M79605 Pain in left leg: Secondary | ICD-10-CM | POA: Diagnosis not present

## 2020-01-19 DIAGNOSIS — Z9119 Patient's noncompliance with other medical treatment and regimen: Secondary | ICD-10-CM | POA: Diagnosis not present

## 2020-01-19 DIAGNOSIS — N186 End stage renal disease: Secondary | ICD-10-CM | POA: Diagnosis present

## 2020-01-19 DIAGNOSIS — R918 Other nonspecific abnormal finding of lung field: Secondary | ICD-10-CM | POA: Diagnosis not present

## 2020-01-19 DIAGNOSIS — Z20828 Contact with and (suspected) exposure to other viral communicable diseases: Secondary | ICD-10-CM | POA: Diagnosis not present

## 2020-01-19 DIAGNOSIS — U071 COVID-19: Secondary | ICD-10-CM | POA: Diagnosis present

## 2020-01-19 DIAGNOSIS — A419 Sepsis, unspecified organism: Secondary | ICD-10-CM | POA: Diagnosis not present

## 2020-01-19 DIAGNOSIS — R0902 Hypoxemia: Secondary | ICD-10-CM | POA: Diagnosis not present

## 2020-01-19 DIAGNOSIS — I132 Hypertensive heart and chronic kidney disease with heart failure and with stage 5 chronic kidney disease, or end stage renal disease: Secondary | ICD-10-CM | POA: Diagnosis present

## 2020-01-19 DIAGNOSIS — G8929 Other chronic pain: Secondary | ICD-10-CM | POA: Diagnosis not present

## 2020-01-19 DIAGNOSIS — Z89512 Acquired absence of left leg below knee: Secondary | ICD-10-CM | POA: Diagnosis not present

## 2020-01-20 DIAGNOSIS — Z992 Dependence on renal dialysis: Secondary | ICD-10-CM | POA: Diagnosis not present

## 2020-01-20 DIAGNOSIS — N186 End stage renal disease: Secondary | ICD-10-CM | POA: Diagnosis not present

## 2020-01-20 DIAGNOSIS — E1122 Type 2 diabetes mellitus with diabetic chronic kidney disease: Secondary | ICD-10-CM | POA: Diagnosis not present

## 2020-01-23 MED ORDER — AMLODIPINE BESYLATE 5 MG PO TABS
10.00 | ORAL_TABLET | ORAL | Status: DC
Start: 2020-01-24 — End: 2020-01-23

## 2020-01-23 MED ORDER — DEXTROSE 10 % IV SOLN
125.00 | INTRAVENOUS | Status: DC
Start: ? — End: 2020-01-23

## 2020-01-23 MED ORDER — ATORVASTATIN CALCIUM 40 MG PO TABS
40.00 | ORAL_TABLET | ORAL | Status: DC
Start: 2020-01-23 — End: 2020-01-23

## 2020-01-23 MED ORDER — FERROUS SULFATE 325 (65 FE) MG PO TABS
325.00 | ORAL_TABLET | ORAL | Status: DC
Start: 2020-01-23 — End: 2020-01-23

## 2020-01-23 MED ORDER — TIZANIDINE HCL 2 MG PO TABS
2.00 | ORAL_TABLET | ORAL | Status: DC
Start: ? — End: 2020-01-23

## 2020-01-23 MED ORDER — PANTOPRAZOLE SODIUM 40 MG PO TBEC
40.00 | DELAYED_RELEASE_TABLET | ORAL | Status: DC
Start: 2020-01-24 — End: 2020-01-23

## 2020-01-23 MED ORDER — BISACODYL 5 MG PO TBEC
10.00 | DELAYED_RELEASE_TABLET | ORAL | Status: DC
Start: ? — End: 2020-01-23

## 2020-01-23 MED ORDER — GLUCOSE 40 % PO GEL
15.00 | ORAL | Status: DC
Start: ? — End: 2020-01-23

## 2020-01-23 MED ORDER — HEPARIN SODIUM (PORCINE) 5000 UNIT/ML IJ SOLN
5000.00 | INTRAMUSCULAR | Status: DC
Start: 2020-01-23 — End: 2020-01-23

## 2020-01-23 MED ORDER — SEVELAMER CARBONATE 800 MG PO TABS
800.00 | ORAL_TABLET | ORAL | Status: DC
Start: 2020-01-23 — End: 2020-01-23

## 2020-01-23 MED ORDER — GENERIC EXTERNAL MEDICATION
6.00 | Status: DC
Start: 2020-01-23 — End: 2020-01-23

## 2020-01-23 MED ORDER — INSULIN LISPRO 100 UNIT/ML ~~LOC~~ SOLN
2.00 | SUBCUTANEOUS | Status: DC
Start: 2020-01-23 — End: 2020-01-23

## 2020-01-23 MED ORDER — INSULIN GLARGINE 100 UNIT/ML ~~LOC~~ SOLN
10.00 | SUBCUTANEOUS | Status: DC
Start: 2020-01-23 — End: 2020-01-23

## 2020-01-23 MED ORDER — OXYCODONE-ACETAMINOPHEN 5-325 MG PO TABS
1.00 | ORAL_TABLET | ORAL | Status: DC
Start: ? — End: 2020-01-23

## 2020-01-23 MED ORDER — METOPROLOL SUCCINATE ER 25 MG PO TB24
25.00 | ORAL_TABLET | ORAL | Status: DC
Start: 2020-01-24 — End: 2020-01-23

## 2020-01-23 MED ORDER — HYDRALAZINE HCL 25 MG PO TABS
25.00 | ORAL_TABLET | ORAL | Status: DC
Start: 2020-01-23 — End: 2020-01-23

## 2020-01-23 MED ORDER — ACETAMINOPHEN 325 MG PO TABS
650.00 | ORAL_TABLET | ORAL | Status: DC
Start: ? — End: 2020-01-23

## 2020-01-23 MED ORDER — DSS 100 MG PO CAPS
100.00 | ORAL_CAPSULE | ORAL | Status: DC
Start: ? — End: 2020-01-23

## 2020-01-23 MED ORDER — GENERIC EXTERNAL MEDICATION
100.00 | Status: DC
Start: 2020-01-23 — End: 2020-01-23

## 2020-01-23 MED ORDER — CALCIUM CARBONATE 1250 (500 CA) MG PO CHEW
500.00 | CHEWABLE_TABLET | ORAL | Status: DC
Start: 2020-01-23 — End: 2020-01-23

## 2020-01-23 MED ORDER — ASPIRIN 81 MG PO CHEW
81.00 | CHEWABLE_TABLET | ORAL | Status: DC
Start: 2020-01-24 — End: 2020-01-23

## 2020-01-23 MED ORDER — ONDANSETRON 4 MG PO TBDP
4.00 | ORAL_TABLET | ORAL | Status: DC
Start: ? — End: 2020-01-23

## 2020-01-23 MED ORDER — GENERIC EXTERNAL MEDICATION
500.00 | Status: DC
Start: 2020-01-23 — End: 2020-01-23

## 2020-01-24 DIAGNOSIS — R462 Strange and inexplicable behavior: Secondary | ICD-10-CM | POA: Insufficient documentation

## 2020-01-28 DIAGNOSIS — Z992 Dependence on renal dialysis: Secondary | ICD-10-CM | POA: Diagnosis not present

## 2020-01-28 DIAGNOSIS — I132 Hypertensive heart and chronic kidney disease with heart failure and with stage 5 chronic kidney disease, or end stage renal disease: Secondary | ICD-10-CM | POA: Diagnosis not present

## 2020-01-28 DIAGNOSIS — N186 End stage renal disease: Secondary | ICD-10-CM | POA: Diagnosis not present

## 2020-01-28 DIAGNOSIS — E11319 Type 2 diabetes mellitus with unspecified diabetic retinopathy without macular edema: Secondary | ICD-10-CM | POA: Diagnosis not present

## 2020-01-28 DIAGNOSIS — Z89512 Acquired absence of left leg below knee: Secondary | ICD-10-CM | POA: Diagnosis not present

## 2020-01-28 DIAGNOSIS — Z6833 Body mass index (BMI) 33.0-33.9, adult: Secondary | ICD-10-CM | POA: Diagnosis not present

## 2020-01-28 DIAGNOSIS — Z9181 History of falling: Secondary | ICD-10-CM | POA: Diagnosis not present

## 2020-01-28 DIAGNOSIS — Z87891 Personal history of nicotine dependence: Secondary | ICD-10-CM | POA: Diagnosis not present

## 2020-01-28 DIAGNOSIS — R462 Strange and inexplicable behavior: Secondary | ICD-10-CM | POA: Diagnosis not present

## 2020-01-28 DIAGNOSIS — I480 Paroxysmal atrial fibrillation: Secondary | ICD-10-CM | POA: Diagnosis not present

## 2020-01-28 DIAGNOSIS — I69322 Dysarthria following cerebral infarction: Secondary | ICD-10-CM | POA: Diagnosis not present

## 2020-01-28 DIAGNOSIS — J1282 Pneumonia due to coronavirus disease 2019: Secondary | ICD-10-CM | POA: Diagnosis not present

## 2020-01-28 DIAGNOSIS — Z7982 Long term (current) use of aspirin: Secondary | ICD-10-CM | POA: Diagnosis not present

## 2020-01-28 DIAGNOSIS — E669 Obesity, unspecified: Secondary | ICD-10-CM | POA: Diagnosis not present

## 2020-01-28 DIAGNOSIS — E785 Hyperlipidemia, unspecified: Secondary | ICD-10-CM | POA: Diagnosis not present

## 2020-01-28 DIAGNOSIS — E1122 Type 2 diabetes mellitus with diabetic chronic kidney disease: Secondary | ICD-10-CM | POA: Diagnosis not present

## 2020-01-28 DIAGNOSIS — E875 Hyperkalemia: Secondary | ICD-10-CM | POA: Diagnosis not present

## 2020-01-28 DIAGNOSIS — I5030 Unspecified diastolic (congestive) heart failure: Secondary | ICD-10-CM | POA: Diagnosis not present

## 2020-01-28 DIAGNOSIS — Z993 Dependence on wheelchair: Secondary | ICD-10-CM | POA: Diagnosis not present

## 2020-01-28 DIAGNOSIS — E878 Other disorders of electrolyte and fluid balance, not elsewhere classified: Secondary | ICD-10-CM | POA: Diagnosis not present

## 2020-01-28 DIAGNOSIS — U071 COVID-19: Secondary | ICD-10-CM | POA: Diagnosis not present

## 2020-01-31 DIAGNOSIS — E1122 Type 2 diabetes mellitus with diabetic chronic kidney disease: Secondary | ICD-10-CM | POA: Diagnosis not present

## 2020-01-31 DIAGNOSIS — Z992 Dependence on renal dialysis: Secondary | ICD-10-CM | POA: Diagnosis not present

## 2020-01-31 DIAGNOSIS — E1129 Type 2 diabetes mellitus with other diabetic kidney complication: Secondary | ICD-10-CM | POA: Diagnosis not present

## 2020-01-31 DIAGNOSIS — Z743 Need for continuous supervision: Secondary | ICD-10-CM | POA: Diagnosis not present

## 2020-01-31 DIAGNOSIS — D509 Iron deficiency anemia, unspecified: Secondary | ICD-10-CM | POA: Diagnosis not present

## 2020-01-31 DIAGNOSIS — U071 COVID-19: Secondary | ICD-10-CM | POA: Diagnosis not present

## 2020-01-31 DIAGNOSIS — R0902 Hypoxemia: Secondary | ICD-10-CM | POA: Diagnosis not present

## 2020-01-31 DIAGNOSIS — D631 Anemia in chronic kidney disease: Secondary | ICD-10-CM | POA: Diagnosis not present

## 2020-01-31 DIAGNOSIS — E1165 Type 2 diabetes mellitus with hyperglycemia: Secondary | ICD-10-CM | POA: Diagnosis not present

## 2020-01-31 DIAGNOSIS — N2581 Secondary hyperparathyroidism of renal origin: Secondary | ICD-10-CM | POA: Diagnosis not present

## 2020-01-31 DIAGNOSIS — N186 End stage renal disease: Secondary | ICD-10-CM | POA: Diagnosis not present

## 2020-01-31 DIAGNOSIS — R279 Unspecified lack of coordination: Secondary | ICD-10-CM | POA: Diagnosis not present

## 2020-01-31 DIAGNOSIS — I5030 Unspecified diastolic (congestive) heart failure: Secondary | ICD-10-CM | POA: Diagnosis not present

## 2020-01-31 DIAGNOSIS — I132 Hypertensive heart and chronic kidney disease with heart failure and with stage 5 chronic kidney disease, or end stage renal disease: Secondary | ICD-10-CM | POA: Diagnosis not present

## 2020-01-31 DIAGNOSIS — R531 Weakness: Secondary | ICD-10-CM | POA: Diagnosis not present

## 2020-01-31 DIAGNOSIS — J1282 Pneumonia due to coronavirus disease 2019: Secondary | ICD-10-CM | POA: Diagnosis not present

## 2020-02-02 DIAGNOSIS — D631 Anemia in chronic kidney disease: Secondary | ICD-10-CM | POA: Diagnosis not present

## 2020-02-02 DIAGNOSIS — I132 Hypertensive heart and chronic kidney disease with heart failure and with stage 5 chronic kidney disease, or end stage renal disease: Secondary | ICD-10-CM | POA: Diagnosis not present

## 2020-02-02 DIAGNOSIS — U071 COVID-19: Secondary | ICD-10-CM | POA: Diagnosis not present

## 2020-02-02 DIAGNOSIS — N2581 Secondary hyperparathyroidism of renal origin: Secondary | ICD-10-CM | POA: Diagnosis not present

## 2020-02-02 DIAGNOSIS — D509 Iron deficiency anemia, unspecified: Secondary | ICD-10-CM | POA: Diagnosis not present

## 2020-02-02 DIAGNOSIS — E1129 Type 2 diabetes mellitus with other diabetic kidney complication: Secondary | ICD-10-CM | POA: Diagnosis not present

## 2020-02-02 DIAGNOSIS — Z992 Dependence on renal dialysis: Secondary | ICD-10-CM | POA: Diagnosis not present

## 2020-02-02 DIAGNOSIS — E1122 Type 2 diabetes mellitus with diabetic chronic kidney disease: Secondary | ICD-10-CM | POA: Diagnosis not present

## 2020-02-02 DIAGNOSIS — J1282 Pneumonia due to coronavirus disease 2019: Secondary | ICD-10-CM | POA: Diagnosis not present

## 2020-02-02 DIAGNOSIS — I5030 Unspecified diastolic (congestive) heart failure: Secondary | ICD-10-CM | POA: Diagnosis not present

## 2020-02-02 DIAGNOSIS — N186 End stage renal disease: Secondary | ICD-10-CM | POA: Diagnosis not present

## 2020-02-05 DIAGNOSIS — Z992 Dependence on renal dialysis: Secondary | ICD-10-CM | POA: Diagnosis not present

## 2020-02-05 DIAGNOSIS — N186 End stage renal disease: Secondary | ICD-10-CM | POA: Diagnosis not present

## 2020-02-05 DIAGNOSIS — N2581 Secondary hyperparathyroidism of renal origin: Secondary | ICD-10-CM | POA: Diagnosis not present

## 2020-02-05 DIAGNOSIS — D509 Iron deficiency anemia, unspecified: Secondary | ICD-10-CM | POA: Diagnosis not present

## 2020-02-05 DIAGNOSIS — D631 Anemia in chronic kidney disease: Secondary | ICD-10-CM | POA: Diagnosis not present

## 2020-02-05 DIAGNOSIS — E1129 Type 2 diabetes mellitus with other diabetic kidney complication: Secondary | ICD-10-CM | POA: Diagnosis not present

## 2020-02-06 ENCOUNTER — Other Ambulatory Visit: Payer: Self-pay | Admitting: Legal Medicine

## 2020-02-07 DIAGNOSIS — D631 Anemia in chronic kidney disease: Secondary | ICD-10-CM | POA: Diagnosis not present

## 2020-02-07 DIAGNOSIS — N186 End stage renal disease: Secondary | ICD-10-CM | POA: Diagnosis not present

## 2020-02-07 DIAGNOSIS — D509 Iron deficiency anemia, unspecified: Secondary | ICD-10-CM | POA: Diagnosis not present

## 2020-02-07 DIAGNOSIS — E1129 Type 2 diabetes mellitus with other diabetic kidney complication: Secondary | ICD-10-CM | POA: Diagnosis not present

## 2020-02-07 DIAGNOSIS — N2581 Secondary hyperparathyroidism of renal origin: Secondary | ICD-10-CM | POA: Diagnosis not present

## 2020-02-07 DIAGNOSIS — Z992 Dependence on renal dialysis: Secondary | ICD-10-CM | POA: Diagnosis not present

## 2020-02-09 DIAGNOSIS — G4700 Insomnia, unspecified: Secondary | ICD-10-CM | POA: Diagnosis not present

## 2020-02-09 DIAGNOSIS — E1129 Type 2 diabetes mellitus with other diabetic kidney complication: Secondary | ICD-10-CM | POA: Diagnosis not present

## 2020-02-09 DIAGNOSIS — I739 Peripheral vascular disease, unspecified: Secondary | ICD-10-CM | POA: Diagnosis not present

## 2020-02-09 DIAGNOSIS — N2581 Secondary hyperparathyroidism of renal origin: Secondary | ICD-10-CM | POA: Diagnosis not present

## 2020-02-09 DIAGNOSIS — E1122 Type 2 diabetes mellitus with diabetic chronic kidney disease: Secondary | ICD-10-CM | POA: Diagnosis not present

## 2020-02-09 DIAGNOSIS — Z992 Dependence on renal dialysis: Secondary | ICD-10-CM | POA: Diagnosis not present

## 2020-02-09 DIAGNOSIS — D509 Iron deficiency anemia, unspecified: Secondary | ICD-10-CM | POA: Diagnosis not present

## 2020-02-09 DIAGNOSIS — N186 End stage renal disease: Secondary | ICD-10-CM | POA: Diagnosis not present

## 2020-02-09 DIAGNOSIS — G894 Chronic pain syndrome: Secondary | ICD-10-CM | POA: Diagnosis not present

## 2020-02-09 DIAGNOSIS — D631 Anemia in chronic kidney disease: Secondary | ICD-10-CM | POA: Diagnosis not present

## 2020-02-12 DIAGNOSIS — D631 Anemia in chronic kidney disease: Secondary | ICD-10-CM | POA: Diagnosis not present

## 2020-02-12 DIAGNOSIS — N2581 Secondary hyperparathyroidism of renal origin: Secondary | ICD-10-CM | POA: Diagnosis not present

## 2020-02-12 DIAGNOSIS — Z992 Dependence on renal dialysis: Secondary | ICD-10-CM | POA: Diagnosis not present

## 2020-02-12 DIAGNOSIS — N186 End stage renal disease: Secondary | ICD-10-CM | POA: Diagnosis not present

## 2020-02-12 DIAGNOSIS — E1129 Type 2 diabetes mellitus with other diabetic kidney complication: Secondary | ICD-10-CM | POA: Diagnosis not present

## 2020-02-12 DIAGNOSIS — D509 Iron deficiency anemia, unspecified: Secondary | ICD-10-CM | POA: Diagnosis not present

## 2020-02-14 DIAGNOSIS — N2581 Secondary hyperparathyroidism of renal origin: Secondary | ICD-10-CM | POA: Diagnosis not present

## 2020-02-14 DIAGNOSIS — D631 Anemia in chronic kidney disease: Secondary | ICD-10-CM | POA: Diagnosis not present

## 2020-02-14 DIAGNOSIS — D509 Iron deficiency anemia, unspecified: Secondary | ICD-10-CM | POA: Diagnosis not present

## 2020-02-14 DIAGNOSIS — Z992 Dependence on renal dialysis: Secondary | ICD-10-CM | POA: Diagnosis not present

## 2020-02-14 DIAGNOSIS — N186 End stage renal disease: Secondary | ICD-10-CM | POA: Diagnosis not present

## 2020-02-14 DIAGNOSIS — E1129 Type 2 diabetes mellitus with other diabetic kidney complication: Secondary | ICD-10-CM | POA: Diagnosis not present

## 2020-02-16 DIAGNOSIS — E1129 Type 2 diabetes mellitus with other diabetic kidney complication: Secondary | ICD-10-CM | POA: Diagnosis not present

## 2020-02-16 DIAGNOSIS — N186 End stage renal disease: Secondary | ICD-10-CM | POA: Diagnosis not present

## 2020-02-16 DIAGNOSIS — D631 Anemia in chronic kidney disease: Secondary | ICD-10-CM | POA: Diagnosis not present

## 2020-02-16 DIAGNOSIS — D509 Iron deficiency anemia, unspecified: Secondary | ICD-10-CM | POA: Diagnosis not present

## 2020-02-16 DIAGNOSIS — N2581 Secondary hyperparathyroidism of renal origin: Secondary | ICD-10-CM | POA: Diagnosis not present

## 2020-02-16 DIAGNOSIS — Z992 Dependence on renal dialysis: Secondary | ICD-10-CM | POA: Diagnosis not present

## 2020-02-19 DIAGNOSIS — Z992 Dependence on renal dialysis: Secondary | ICD-10-CM | POA: Diagnosis not present

## 2020-02-19 DIAGNOSIS — N186 End stage renal disease: Secondary | ICD-10-CM | POA: Diagnosis not present

## 2020-02-19 DIAGNOSIS — D509 Iron deficiency anemia, unspecified: Secondary | ICD-10-CM | POA: Diagnosis not present

## 2020-02-19 DIAGNOSIS — D631 Anemia in chronic kidney disease: Secondary | ICD-10-CM | POA: Diagnosis not present

## 2020-02-19 DIAGNOSIS — N2581 Secondary hyperparathyroidism of renal origin: Secondary | ICD-10-CM | POA: Diagnosis not present

## 2020-02-19 DIAGNOSIS — E1129 Type 2 diabetes mellitus with other diabetic kidney complication: Secondary | ICD-10-CM | POA: Diagnosis not present

## 2020-02-28 ENCOUNTER — Other Ambulatory Visit: Payer: Self-pay

## 2020-02-28 DIAGNOSIS — T148XXA Other injury of unspecified body region, initial encounter: Secondary | ICD-10-CM | POA: Insufficient documentation

## 2020-02-28 DIAGNOSIS — T50905A Adverse effect of unspecified drugs, medicaments and biological substances, initial encounter: Secondary | ICD-10-CM | POA: Insufficient documentation

## 2020-02-28 DIAGNOSIS — I10 Essential (primary) hypertension: Secondary | ICD-10-CM | POA: Insufficient documentation

## 2020-02-28 DIAGNOSIS — R0902 Hypoxemia: Secondary | ICD-10-CM | POA: Insufficient documentation

## 2020-02-28 DIAGNOSIS — R112 Nausea with vomiting, unspecified: Secondary | ICD-10-CM | POA: Insufficient documentation

## 2020-02-28 DIAGNOSIS — R7989 Other specified abnormal findings of blood chemistry: Secondary | ICD-10-CM | POA: Insufficient documentation

## 2020-02-28 DIAGNOSIS — R700 Elevated erythrocyte sedimentation rate: Secondary | ICD-10-CM | POA: Insufficient documentation

## 2020-02-28 DIAGNOSIS — E860 Dehydration: Secondary | ICD-10-CM | POA: Insufficient documentation

## 2020-02-28 DIAGNOSIS — E785 Hyperlipidemia, unspecified: Secondary | ICD-10-CM | POA: Insufficient documentation

## 2020-02-28 DIAGNOSIS — I161 Hypertensive emergency: Secondary | ICD-10-CM | POA: Insufficient documentation

## 2020-02-28 DIAGNOSIS — S82899A Other fracture of unspecified lower leg, initial encounter for closed fracture: Secondary | ICD-10-CM | POA: Insufficient documentation

## 2020-02-29 ENCOUNTER — Encounter: Payer: Self-pay | Admitting: Sports Medicine

## 2020-02-29 ENCOUNTER — Other Ambulatory Visit: Payer: Self-pay

## 2020-02-29 ENCOUNTER — Ambulatory Visit (INDEPENDENT_AMBULATORY_CARE_PROVIDER_SITE_OTHER): Payer: Medicare Other | Admitting: Sports Medicine

## 2020-02-29 ENCOUNTER — Other Ambulatory Visit: Payer: Self-pay | Admitting: Sports Medicine

## 2020-02-29 DIAGNOSIS — E1142 Type 2 diabetes mellitus with diabetic polyneuropathy: Secondary | ICD-10-CM

## 2020-02-29 DIAGNOSIS — L84 Corns and callosities: Secondary | ICD-10-CM | POA: Diagnosis not present

## 2020-02-29 DIAGNOSIS — R52 Pain, unspecified: Secondary | ICD-10-CM | POA: Diagnosis not present

## 2020-02-29 DIAGNOSIS — I739 Peripheral vascular disease, unspecified: Secondary | ICD-10-CM | POA: Diagnosis not present

## 2020-02-29 DIAGNOSIS — B351 Tinea unguium: Secondary | ICD-10-CM

## 2020-02-29 DIAGNOSIS — R0902 Hypoxemia: Secondary | ICD-10-CM | POA: Diagnosis not present

## 2020-02-29 DIAGNOSIS — Z89512 Acquired absence of left leg below knee: Secondary | ICD-10-CM | POA: Diagnosis not present

## 2020-02-29 DIAGNOSIS — E11621 Type 2 diabetes mellitus with foot ulcer: Secondary | ICD-10-CM | POA: Diagnosis not present

## 2020-02-29 DIAGNOSIS — M79671 Pain in right foot: Secondary | ICD-10-CM

## 2020-02-29 DIAGNOSIS — L97419 Non-pressure chronic ulcer of right heel and midfoot with unspecified severity: Secondary | ICD-10-CM | POA: Diagnosis not present

## 2020-02-29 DIAGNOSIS — I10 Essential (primary) hypertension: Secondary | ICD-10-CM | POA: Diagnosis not present

## 2020-02-29 NOTE — Progress Notes (Signed)
Subjective: Karen [name] is a 44 y.o. female patient seen in office for evaluation of ulceration of the right lateral heel. Patient has a history of diabetes and a blood glucose level today of 117 mg/dl.   Patient reports that she noticed a black spot on her right heel about 2 weeks ago because it was sore when she would lay in bed. Patient denies any redness, warmth, drainage or odor. Patient reports that she lost her left foot from a sore that would not heal and was black many years ago. Patient states that Dr. Doran Durand did her amputation and that her leg has a callus on the stump on the left that she wants me to check. Patient does not follow with vascular. Patient has no other pedal complaints at this time.  Review of Systems  All other systems reviewed and are negative.    Patient Active Problem List   Diagnosis Date Noted  . Nausea and vomiting 02/28/2020  . Abrasion 02/28/2020  . Avulsion fracture of ankle 02/28/2020  . Drug or medicinal substance causing adverse effect in therapeutic use 02/28/2020  . Elevated brain natriuretic peptide (BNP) level 02/28/2020  . HLD (hyperlipidemia) 02/28/2020  . Hypertension, poor control 02/28/2020  . Hypertensive emergency 02/28/2020  . Hypoxia 02/28/2020  . Mild dehydration 02/28/2020  . Sedimentation rate elevation 02/28/2020  . Strange and inexplicable behavior 00/93/8182  . COVID-19 01/19/2020  . Dysphasia status post cerebrovascular accident 11/06/2019  . Lumbar spondylosis 11/06/2019  . Anxiety and depression 06/04/2019  . Cerebral infarction due to thrombosis of unspecified precerebral artery ([place]) 06/04/2019  . Chronic pain syndrome 06/04/2019  . Neck pain on right side 06/04/2019  . Stroke due to embolism of right middle cerebral artery ([place]) 06/02/2019  . Pulmonary edema 02/20/2019  . Excoriation (skin-picking) disorder 11/22/2018  . Type 2 diabetes mellitus with hyperglycemia ([place]) 11/14/2018  . SBO (small bowel obstruction) ([place])  07/08/2018  . Small bowel obstruction ([place]) 07/08/2018  . Hypercalcemia 02/17/2018  . Weakness   . Acute on chronic systolic CHF (congestive heart failure) ([place])   . ESRD on dialysis ([place])   . Homelessness   . Late effect of cerebrovascular accident (CVA)   . Dyspnea 01/27/2018  . Hypertension, uncontrolled 11/11/2017  . Fluid overload 11/11/2017  . Abdominal pain   . Sepsis due to methicillin resistant Staphylococcus aureus ([place]) 11/01/2017  . Penicillin allergy 10/15/2017  . Thrombocytopenia ([place]) 10/15/2017  . Personal history of transient ischemic attack (TIA), and cerebral infarction without residual deficits 09/15/2017  . Dysarthria 09/14/2017  . Lacunar infarct, acute ([place]) 09/14/2017  . Obesity 09/09/2017  . Chest pain 09/07/2017  . Other staphylococcus as the cause of diseases classified elsewhere 08/23/2017  . Diarrhea, unspecified 12/29/2016  . Fluid overload, unspecified 10/03/2016  . Hyperkalemia 10/03/2016  . Hypertension with fluid overload 10/03/2016  . Anemia in other chronic diseases classified elsewhere 09/08/2016  . High anion gap metabolic acidosis 99/37/1696  . Right lower lobe pneumonia 09/08/2016  . Iron deficiency anemia, unspecified 02/19/2016  . Complication of vascular dialysis catheter 02/18/2016  . Encounter for removal of sutures 02/11/2016  . Uncontrolled type 2 diabetes mellitus with chronic kidney disease on chronic dialysis, with long-term current use of insulin ([place]) 01/31/2016  . Drug noncompliance   . End-stage renal disease on hemodialysis ([place])   . Unspecified protein-calorie malnutrition ([place]) 01/24/2016  . Secondary hyperparathyroidism of renal origin ([place]) 01/22/2016  . Acquired absence of left leg below knee ([place]) 01/16/2016  .  CHF (congestive heart failure) ([place]) 01/16/2016  . Coagulation defect, unspecified ([place]) 01/16/2016  . Encounter for immunization 01/16/2016  . Family history of diabetes mellitus 01/16/2016  . Hypertensive  chronic kidney disease with stage 1 through stage 4 chronic kidney disease, or unspecified chronic kidney disease 01/16/2016  . Nicotine dependence, other tobacco product, in remission 01/16/2016  . Pain, unspecified 01/16/2016  . Pruritus, unspecified 01/16/2016  . Red blood cell antibody positive 01/10/2016  . Hx of BKA ([place]) 07/03/2013  . S/P BKA (below knee amputation) unilateral ([place]) 06/30/2013  . Physical deconditioning 06/30/2013  . Cardiomyopathy, ischemic - EF 45-50% with inf WMA by 2D 02/05/13 02/06/2013  . Essential hypertension 02/05/2013  . PAD (peripheral artery disease) ([place]) 02/05/2013  . Diabetic neuropathy ([place])   . Osteomyelitis of left great toe - S/P amputation 02/06/13 04/21/2011   Current Outpatient Medications on File Prior to Visit  Medication Sig Dispense Refill  . amLODipine (NORVASC) 10 MG tablet Take 10 mg by mouth daily.    Marland Kitchen aspirin 325 MG tablet Take 1 tablet (325 mg total) by mouth daily. (Patient not taking: Reported on 02/07/2019) 30 tablet 0  . aspirin EC 81 MG tablet Take 81 mg by mouth daily.    Marland Kitchen atorvastatin (LIPITOR) 40 MG tablet Take by mouth.    . calcitRIOL (ROCALTROL) 0.5 MCG capsule Take 3 capsules (1.5 mcg total) by mouth Every Tuesday,Thursday,and Saturday with dialysis. (Patient not taking: Reported on 07/09/2018) 90 capsule 0  . calcium carbonate (OS-CAL) 1250 (500 Ca) MG chewable tablet Chew by mouth.    . cinacalcet (SENSIPAR) 30 MG tablet Take 1 tablet (30 mg total) by mouth Every Tuesday,Thursday,and Saturday with dialysis. (Patient taking differently: Take 30 mg by mouth every Monday, Wednesday, and Friday with hemodialysis. ) 60 tablet 0  . Darbepoetin Alfa (ARANESP) 200 MCG/0.4ML SOSY injection Inject 0.4 mLs (200 mcg total) into the vein every Thursday with hemodialysis. (Patient not taking: Reported on 07/09/2018) 1.68 mL   . dexamethasone (DECADRON) 6 MG tablet Take 6 mg by mouth daily.    . ferric gluconate 62.5 mg in sodium chloride  0.9 % 100 mL Inject 62.5 mg into the vein every Thursday with hemodialysis. (Patient not taking: Reported on 07/09/2018)    . ferrous sulfate 325 (65 FE) MG tablet Take by mouth.    . gabapentin (NEURONTIN) 100 MG capsule TAKE 1 CAPSULE BY MOUTH EVERY DAY IN THE EVENING    . hydrALAZINE (APRESOLINE) 25 MG tablet Take by mouth.    . insulin glargine (LANTUS) 100 UNIT/ML injection Inject 18 Units into the skin at bedtime.    . insulin regular (HUMULIN R) 100 units/mL injection AS PER SLIDING SCALE AS DIRECTED FOUR TIMES DAILY    . lidocaine-prilocaine (EMLA) cream APPLY SMALL AMOUNT TO ACCESS SITE (AVF) 1 TO 2 HOURS BEFORE DIALYSIS. COVER WITH OCCLUSIVE DRESSING (SARAN WRAP)    . methocarbamol (ROBAXIN) 500 MG tablet TAKE 1 TABLET BY MOUTH TWICE DAILY    . Methoxy PEG-Epoetin Beta (MIRCERA IJ) Mircera    . metoprolol succinate (TOPROL-XL) 25 MG 24 hr tablet Take 0.5 tablets (12.5 mg total) by mouth daily. 15 tablet 0  . multivitamin (RENA-VIT) TABS tablet Take 1 tablet by mouth at bedtime. (Patient not taking: Reported on 07/09/2018) 30 tablet 0  . naloxone (NARCAN) 4 MG/0.1ML LIQD nasal spray kit INSTILL 1 SPRAY IN ONCE NOSTRIL REPEAT AFTER 3 MINUTES IF NO OR MINIMUL RESPONSE    . NOVOFINE 32G X 6 MM  MISC     . ondansetron (ZOFRAN) 4 MG tablet Take 4 mg by mouth daily as needed for nausea or vomiting.    Marland Kitchen oxyCODONE (OXY IR/ROXICODONE) 5 MG immediate release tablet Take by mouth.    . pantoprazole (PROTONIX) 20 MG tablet TAKE 1 TABLET(20 MG) BY MOUTH EVERY DAY 90 tablet 2  . polyethylene glycol (MIRALAX / GLYCOLAX) packet Take 17 g by mouth daily. (Patient not taking: Reported on 07/09/2018) 14 each 0  . sevelamer carbonate (RENVELA) 800 MG tablet Take 800 mg by mouth 3 (three) times daily with meals.     No current facility-administered medications on file prior to visit.   Allergies  Allergen Reactions  . Baclofen Itching  . Penicillins Anaphylaxis, Hives, Rash and Other (See Comments)     PATIENT HAD A PCN REACTION WITH IMMEDIATE RASH, FACIAL/TONGUE/THROAT SWELLING, SOB, OR LIGHTHEADEDNESS WITH HYPOTENSION:  #  #  #  YES  #  #  #   SEVERE RASH INVOLVING MUCUS MEMBRANES or SKIN NECROSIS: #  #  #  YES  #  #  # Has patient had a PCN reaction that required hospitalization No Has patient had a PCN reaction occurring within the last 10 years: No If all of the above answers are "NO", then may proceed with Cephalosporin use.  09/10/16- tolerated Cefepime   . Morphine And Related Hives and Rash  . Novolog [Insulin Aspart] Other (See Comments)    Cramps/ Gi distress    No results found for this or any previous visit (from the past 2160 hour(s)).  Objective: There were no vitals filed for this visit.  General: Patient is awake, alert, oriented x 3 and in no acute distress in wheelchair and was talking on phone during her visit.  Dermatology: Skin is warm and dry bilateral with a dry eschar to right lateral heel that measures 2x1cm, There is no malodor, no active drainage, no erythema, no edema. No other acute signs of infection.  There is a reactive callus noted to the anterior flap site of her left BKA site and right submet 1 with no signs of infection.   Nails x5 on right are elongated and thickened consistent with onychomycosis   Vascular: Dorsalis Pedis pulse = 0 out of 4 on right, posterior Tibial pulse = 0 out of 4 on right,  Capillary Fill Time < 5 seconds  Neurologic: Protective pedal sensation absent on right.  Musculosketal: There is minimal pain to palpation to ulcerated area on right lateral heel.  Status post left BKA.  No results for input(s): GRAMSTAIN, LABORGA in the last 8760 hours.  Assessment and Plan:  Problem List Items Addressed This Visit      Cardiovascular and Mediastinum   PAD (peripheral artery disease) ([place])     Endocrine   Diabetic neuropathy ([place])    Other Visit Diagnoses    Heel ulcer, right, with unspecified severity ([place])    -  Primary    Callus       Onychomycosis       History of left below knee amputation ([place])           -Examined patient and discussed the progression of the wound and treatment alternatives. -Xrays are unable to be obtained this visit due to patient talking on the phone we will attempt to obtain them at patient's next visit -Cleansed right heel ulceration and applied a small amount of Medihoney and Mepilex border dressing advised patient to have her caretaker help  with doing the same daily.  - Advised patient to go to the ER or return to office if the wound worsens or if constitutional symptoms are present. -Advised patient that she is high risk and would benefit from close vascular and medical follow-up; we will reach out to the peripheral vascular care navigator to help with her care needs including getting ABIs -Dispensed foot miracle cream for dry callus skin and other areas of the right foot and to use at her left BKA a stump site callus -And no additional charge mechanically debrided nails x5 on the right foot using a sterile nail nipper and advised patient to refrain from going to salons for pedicures -Patient to return to office in 2 weeks for follow up care and evaluation or sooner if problems arise.  Landis Martins, DPM

## 2020-03-04 DIAGNOSIS — Z79899 Other long term (current) drug therapy: Secondary | ICD-10-CM | POA: Diagnosis not present

## 2020-03-04 DIAGNOSIS — G894 Chronic pain syndrome: Secondary | ICD-10-CM | POA: Diagnosis not present

## 2020-03-07 ENCOUNTER — Other Ambulatory Visit (HOSPITAL_COMMUNITY): Payer: Self-pay | Admitting: Sports Medicine

## 2020-03-07 ENCOUNTER — Other Ambulatory Visit: Payer: Self-pay | Admitting: Sports Medicine

## 2020-03-07 ENCOUNTER — Telehealth (HOSPITAL_COMMUNITY): Payer: Self-pay

## 2020-03-07 DIAGNOSIS — E11621 Type 2 diabetes mellitus with foot ulcer: Secondary | ICD-10-CM

## 2020-03-07 DIAGNOSIS — I739 Peripheral vascular disease, unspecified: Secondary | ICD-10-CM

## 2020-03-07 NOTE — Telephone Encounter (Signed)
PV Referral via SecureChat from [facility]. Attempted to contact patient via mobile phone number. No answer. Left a message and phone number for patient to call back.  Cletis Media RN BSN CWS [place]

## 2020-03-08 ENCOUNTER — Telehealth (HOSPITAL_COMMUNITY): Payer: Self-pay

## 2020-03-08 NOTE — Telephone Encounter (Signed)
Return call back from patient's emergency contact Netta Neat and was able to introduce myself and explain my role.  We discussed patient's current status and development of small discoloration to her R heel. Tammy also confirms that this is exactly what happened to the L heel resulting ultimately in a L AKA. Patient has not followed up with vascular in > 1 year. Discussed that order for vascular consult and arterial duplex study has been make and [facility] would be calling to set those appointments up. Tammy also reports that pt has [facility] services that come out every day to assist patient and she would be sure to give them my contact information for any questions or barriers that may come up.  Tammy verbalized her understanding of the information we discussed and has my contact information should she need to reach out. I will also send a message to referring DPM via SecureChat. Thank you! Cletis Media RN BSN CWS [place]

## 2020-03-08 NOTE — Telephone Encounter (Signed)
Per patient's request phone call made to patient's emergency contact to discuss potential plan of care. No answer. Left contact a message and call back number to return call.  Cletis Media RN BSN CWS [place]

## 2020-03-08 NOTE — Telephone Encounter (Signed)
Attempted to reach out again today for [facility] Navigator consult. Unable to reach patient via mobile number, however pt did answer her home number which allowed me to introduce myself and my role. Patient reports she is currently on dialysis and wishes for me to reach out to [facility] listed as her contact to discuss any information with her.   Cletis Media RN BSN CWS [place]

## 2020-03-13 ENCOUNTER — Encounter (HOSPITAL_COMMUNITY): Payer: Medicare Other

## 2020-03-13 ENCOUNTER — Encounter: Payer: Medicare Other | Admitting: Vascular Surgery

## 2020-03-14 ENCOUNTER — Ambulatory Visit: Payer: Medicare Other | Admitting: Sports Medicine

## 2020-03-18 ENCOUNTER — Other Ambulatory Visit: Payer: Self-pay | Admitting: *Deleted

## 2020-03-18 DIAGNOSIS — I739 Peripheral vascular disease, unspecified: Secondary | ICD-10-CM

## 2020-03-19 ENCOUNTER — Ambulatory Visit (INDEPENDENT_AMBULATORY_CARE_PROVIDER_SITE_OTHER): Payer: Medicare Other | Admitting: Vascular Surgery

## 2020-03-19 ENCOUNTER — Ambulatory Visit (INDEPENDENT_AMBULATORY_CARE_PROVIDER_SITE_OTHER)
Admission: RE | Admit: 2020-03-19 | Discharge: 2020-03-19 | Disposition: A | Discharge status: Home / Self Care | Payer: Medicare Other | Source: Ambulatory Visit | Attending: Sports Medicine | Admitting: Sports Medicine

## 2020-03-19 ENCOUNTER — Other Ambulatory Visit: Payer: Self-pay

## 2020-03-19 ENCOUNTER — Ambulatory Visit (HOSPITAL_COMMUNITY)
Admission: RE | Admit: 2020-03-19 | Discharge: 2020-03-19 | Disposition: A | Discharge status: Home / Self Care | Payer: Medicare Other | Source: Ambulatory Visit | Attending: Sports Medicine | Admitting: Sports Medicine

## 2020-03-19 VITALS — BP 193/103 | HR 97 | Temp 97.5°F | Resp 16 | Ht 66.0 in | Wt 209.0 lb

## 2020-03-19 DIAGNOSIS — L97411 Non-pressure chronic ulcer of right heel and midfoot limited to breakdown of skin: Secondary | ICD-10-CM | POA: Insufficient documentation

## 2020-03-19 DIAGNOSIS — I739 Peripheral vascular disease, unspecified: Secondary | ICD-10-CM | POA: Diagnosis present

## 2020-03-19 DIAGNOSIS — E11621 Type 2 diabetes mellitus with foot ulcer: Secondary | ICD-10-CM | POA: Diagnosis present

## 2020-03-19 NOTE — Progress Notes (Signed)
Patient name: Karen [name] MRN: [id number] DOB: Nov 01, 1975 Sex: female  REASON FOR CONSULT: Evaluate right heel wound  HPI: Karen [name] is a 44 y.o. female, with history of end-stage renal disease on hemodialysis Monday Wednesday Friday, HTN, L BKA, PAD, ischemic cardiomyopathy, diabetes, previous stroke with some ongoing dysphagia the presents for evaluation of right heel wound.  Patient states this developed about 2 and half weeks ago and ultimately she was referred here by her podiatrist.  She has a history of a left BKA.  She denies any previous interventions in the right leg.  She states she does stand and transfer with a left leg prosthesis.  Has noted a smell with her right heel wound.  Past Medical History:  Diagnosis Date  . Anemia   . Anxiety   . Dyspnea    "when I dont go to dialysis"  . Eczema   . ESRD (end stage renal disease) ([place])    Hemo - [facility]- [place]  . Exertional shortness of breath    "recently; it's fluid" (02/02/2013)  . GERD (gastroesophageal reflux disease)   . Headache   . History of blood transfusion    "last week" (02/02/2013)  . History of kidney stones    passed  . Hypertension   . Ischemic cardiomyopathy    by echo 2014  . Neuropathy   . Osteomyelitis of toe of left foot ([place])    "off and on since 2009; no OR" (02/02/2013)  . Pneumonia 09/2016  . Type II diabetes mellitus ([place]) 1995    Past Surgical History:  Procedure Laterality Date  . AMPUTATION Left 02/06/2013   Procedure: AMPUTATION LEFT GREAT TOE;  Surgeon: Wylene Simmer, MD;  Location: [facility];  Service: Orthopedics;  Laterality: Left;  . AMPUTATION Left 06/24/2013   Procedure: AMPUTATION BELOW KNEE ;  Surgeon: Wylene Simmer, MD;  Location: [facility];  Service: Orthopedics;  Laterality: Left;  . AV FISTULA PLACEMENT Right 02/08/2017   Procedure: CREATION OF RIGHT ARM  BASILIC VEIN TO BRACHIAL ARTERY ARTERIOVENOUS (AV) FISTULA;  Surgeon: Rosetta Posner, MD;  Location: [facility];  Service: Vascular;   Laterality: Right;  . AV FISTULA PLACEMENT Left 09/10/2017   Procedure: INSERTION OF ARTERIOVENOUS (AV) GORE-TEX GRAFT  LEFT UPPER ARM;  Surgeon: Angelia Mould, MD;  Location: [facility];  Service: Vascular;  Laterality: Left;  . BASCILIC VEIN TRANSPOSITION Right 04/14/2017   Procedure: BASCILIC VEIN TRANSPOSITION-RIGHT 2ND STAGE;  Surgeon: Rosetta Posner, MD;  Location: [facility];  Service: Vascular;  Laterality: Right;  . CESAREAN SECTION  10/18/2006  . INSERTION OF DIALYSIS CATHETER      Family History  Problem Relation Age of Onset  . Hypertension Mother   . Diabetes Mother   . Diabetes Father     SOCIAL HISTORY: Social History   Socioeconomic History  . Marital status: Single    Spouse name: Not on file  . Number of children: Not on file  . Years of education: Not on file  . Highest education level: Not on file  Occupational History  . Not on file  Tobacco Use  . Smoking status: Former Smoker    Packs/day: 3.00    Years: 10.00    Pack years: 30.00    Types: Cigarettes    Quit date: 09/21/2004    Years since quitting: 15.5  . Smokeless tobacco: Never Used  Vaping Use  . Vaping Use: Never used  Substance and Sexual Activity  . Alcohol  use: No  . Drug use: No  . Sexual activity: Never    Birth control/protection: None  Other Topics Concern  . Not on file  Social History Narrative  . Not on file   Social Determinants of Health   Financial Resource Strain:   . Difficulty of Paying Living Expenses:   Food Insecurity:   . Worried About Charity fundraiser in the Last Year:   . Arboriculturist in the Last Year:   Transportation Needs:   . Film/video editor (Medical):   Marland Kitchen Lack of Transportation (Non-Medical):   Physical Activity:   . Days of Exercise per Week:   . Minutes of Exercise per Session:   Stress:   . Feeling of Stress :   Social Connections:   . Frequency of Communication with Friends and Family:   . Frequency of Social Gatherings with Friends  and Family:   . Attends Religious Services:   . Active Member of Clubs or Organizations:   . Attends Archivist Meetings:   Marland Kitchen Marital Status:   Intimate Partner Violence:   . Fear of Current or Ex-Partner:   . Emotionally Abused:   Marland Kitchen Physically Abused:   . Sexually Abused:     Allergies  Allergen Reactions  . Baclofen Itching  . Penicillins Anaphylaxis, Hives, Rash and Other (See Comments)    PATIENT HAD A PCN REACTION WITH IMMEDIATE RASH, FACIAL/TONGUE/THROAT SWELLING, SOB, OR LIGHTHEADEDNESS WITH HYPOTENSION:  #  #  #  YES  #  #  #   SEVERE RASH INVOLVING MUCUS MEMBRANES or SKIN NECROSIS: #  #  #  YES  #  #  # Has patient had a PCN reaction that required hospitalization No Has patient had a PCN reaction occurring within the last 10 years: No If all of the above answers are "NO", then may proceed with Cephalosporin use.  09/10/16- tolerated Cefepime   . Morphine And Related Hives and Rash  . Novolog [Insulin Aspart] Other (See Comments)    Cramps/ Gi distress    Current Outpatient Medications  Medication Sig Dispense Refill  . amLODipine (NORVASC) 10 MG tablet Take 10 mg by mouth daily.    Marland Kitchen aspirin 325 MG tablet Take 1 tablet (325 mg total) by mouth daily. 30 tablet 0  . aspirin EC 81 MG tablet Take 81 mg by mouth daily.    Marland Kitchen atorvastatin (LIPITOR) 40 MG tablet Take by mouth.    . calcitRIOL (ROCALTROL) 0.5 MCG capsule Take 3 capsules (1.5 mcg total) by mouth Every Tuesday,Thursday,and Saturday with dialysis. 90 capsule 0  . calcium carbonate (OS-CAL) 1250 (500 Ca) MG chewable tablet Chew by mouth.    . cinacalcet (SENSIPAR) 30 MG tablet Take 1 tablet (30 mg total) by mouth Every Tuesday,Thursday,and Saturday with dialysis. (Patient taking differently: Take 30 mg by mouth every Monday, Wednesday, and Friday with hemodialysis. ) 60 tablet 0  . dexamethasone (DECADRON) 6 MG tablet Take 6 mg by mouth daily.    . ferrous sulfate 325 (65 FE) MG tablet Take by mouth.      . gabapentin (NEURONTIN) 100 MG capsule TAKE 1 CAPSULE BY MOUTH EVERY DAY IN THE EVENING    . hydrALAZINE (APRESOLINE) 25 MG tablet Take by mouth.    . insulin glargine (LANTUS) 100 UNIT/ML injection Inject 18 Units into the skin at bedtime.    . insulin regular (HUMULIN R) 100 units/mL injection AS PER SLIDING SCALE AS DIRECTED FOUR TIMES  DAILY    . lidocaine-prilocaine (EMLA) cream APPLY SMALL AMOUNT TO ACCESS SITE (AVF) 1 TO 2 HOURS BEFORE DIALYSIS. COVER WITH OCCLUSIVE DRESSING (SARAN WRAP)    . methocarbamol (ROBAXIN) 500 MG tablet TAKE 1 TABLET BY MOUTH TWICE DAILY    . Methoxy PEG-Epoetin Beta (MIRCERA IJ) Mircera    . metoprolol succinate (TOPROL-XL) 25 MG 24 hr tablet Take 0.5 tablets (12.5 mg total) by mouth daily. 15 tablet 0  . multivitamin (RENA-VIT) TABS tablet Take 1 tablet by mouth at bedtime. 30 tablet 0  . naloxone (NARCAN) 4 MG/0.1ML LIQD nasal spray kit INSTILL 1 SPRAY IN ONCE NOSTRIL REPEAT AFTER 3 MINUTES IF NO OR MINIMUL RESPONSE    . NOVOFINE 32G X 6 MM MISC     . ondansetron (ZOFRAN) 4 MG tablet Take 4 mg by mouth daily as needed for nausea or vomiting.    Marland Kitchen oxyCODONE (OXY IR/ROXICODONE) 5 MG immediate release tablet Take by mouth.    . pantoprazole (PROTONIX) 20 MG tablet TAKE 1 TABLET(20 MG) BY MOUTH EVERY DAY 90 tablet 2  . polyethylene glycol (MIRALAX / GLYCOLAX) packet Take 17 g by mouth daily. 14 each 0  . sevelamer carbonate (RENVELA) 800 MG tablet Take 800 mg by mouth 3 (three) times daily with meals.    . Darbepoetin Alfa (ARANESP) 200 MCG/0.4ML SOSY injection Inject 0.4 mLs (200 mcg total) into the vein every Thursday with hemodialysis. (Patient not taking: Reported on 07/09/2018) 1.68 mL   . ferric gluconate 62.5 mg in sodium chloride 0.9 % 100 mL Inject 62.5 mg into the vein every Thursday with hemodialysis. (Patient not taking: Reported on 07/09/2018)     No current facility-administered medications for this visit.    REVIEW OF SYSTEMS:  '[X]'  denotes  positive finding, '[ ]'  denotes negative finding Cardiac  Comments:  Chest pain or chest pressure:    Shortness of breath upon exertion:    Short of breath when lying flat:    Irregular heart rhythm:        Vascular    Pain in calf, thigh, or hip brought on by ambulation:    Pain in feet at night that wakes you up from your sleep:     Blood clot in your veins:    Leg swelling:         Pulmonary    Oxygen at home:    Productive cough:     Wheezing:         Neurologic    Sudden weakness in arms or legs:     Sudden numbness in arms or legs:     Sudden onset of difficulty speaking or slurred speech:    Temporary loss of vision in one eye:     Problems with dizziness:         Gastrointestinal    Blood in stool:     Vomited blood:         Genitourinary    Burning when urinating:     Blood in urine:        Psychiatric    Major depression:         Hematologic    Bleeding problems:    Problems with blood clotting too easily:        Skin    Rashes or ulcers:        Constitutional    Fever or chills:      PHYSICAL EXAM: Vitals:   03/19/20 1009  BP: (!) 193/103  Pulse:  97  Resp: 16  Temp: (!) 97.5 F (36.4 C)  TempSrc: Temporal  Weight: 209 lb (94.8 kg)  Height: '5\' 6"'  (1.676 m)    GENERAL: The patient is a well-nourished female, in no acute distress. The vital signs are documented above. CARDIAC: There is a regular rate and rhythm.  VASCULAR:  Cannot palpate a left femoral pulse but may be limited by pannus and positioning in wheelchair I can appreciate a right femoral pulse Large right heel wound that is necrotic as pictured below PULMONARY: There is good air exchange bilaterally without wheezing or rales. ABDOMEN: Soft and non-tender with normal pitched bowel sounds.  MUSCULOSKELETAL: There are no major deformities or cyanosis. NEUROLOGIC: No focal weakness or paresthesias are detected. SKIN: There are no ulcers or rashes noted. PSYCHIATRIC: The patient has  a normal affect.      DATA:   ABIs are unreliable given noncompressible vessels  Right leg arterial duplex shows monophasic flow in the common femoral with absent flow in the SFA given monophasic flow in the popliteal artery  Assessment/Plan:  44 year old female with multiple medical problems including diabetes and end-stage renal disease that presents with large necrotic right heel wound as pictured above.  I discussed with patient in detail that I think she is at exceedingly high risk for limb loss even with surgical intervention given the amount of soft tissue destruction on exam as pictured above.  After debridement of her heel wound it would be extremely difficult to get any soft tissue coverage and have any expectation this would heal.  She is against any thought of amputation at this time given that she has a contralateral amputation on the left leg.  I discussed next step would then be arteriogram to evaluate for any options for revascularization.  I can appreciate a right femoral pulse but have a hard time feeling a left femoral pulse and discussed this may be a right groin access and just get the runoff to see what option she has versus evaluate her left groin with ultrasound.  We will schedule on Thursday on a nondialysis day.  Risk benefits were discussed in detail including risk of vessel injury bleeding infection etc.   Marty Heck, MD [facility] Office: [phone]

## 2020-03-21 ENCOUNTER — Ambulatory Visit: Payer: Medicare Other | Admitting: Sports Medicine

## 2020-03-21 ENCOUNTER — Encounter (HOSPITAL_COMMUNITY): Admission: RE | Payer: Self-pay | Source: Home / Self Care

## 2020-03-21 ENCOUNTER — Ambulatory Visit (HOSPITAL_COMMUNITY): Admission: RE | Admit: 2020-03-21 | Payer: Medicare Other | Source: Home / Self Care | Admitting: Vascular Surgery

## 2020-03-21 DIAGNOSIS — N186 End stage renal disease: Secondary | ICD-10-CM | POA: Diagnosis not present

## 2020-03-21 DIAGNOSIS — E1122 Type 2 diabetes mellitus with diabetic chronic kidney disease: Secondary | ICD-10-CM | POA: Diagnosis not present

## 2020-03-21 DIAGNOSIS — Z992 Dependence on renal dialysis: Secondary | ICD-10-CM | POA: Diagnosis not present

## 2020-03-21 SURGERY — ABDOMINAL AORTOGRAM W/LOWER EXTREMITY
Anesthesia: LOCAL

## 2020-03-22 DIAGNOSIS — D631 Anemia in chronic kidney disease: Secondary | ICD-10-CM | POA: Diagnosis not present

## 2020-03-22 DIAGNOSIS — N186 End stage renal disease: Secondary | ICD-10-CM | POA: Diagnosis not present

## 2020-03-22 DIAGNOSIS — N2581 Secondary hyperparathyroidism of renal origin: Secondary | ICD-10-CM | POA: Diagnosis not present

## 2020-03-22 DIAGNOSIS — Z992 Dependence on renal dialysis: Secondary | ICD-10-CM | POA: Diagnosis not present

## 2020-03-22 DIAGNOSIS — E11621 Type 2 diabetes mellitus with foot ulcer: Secondary | ICD-10-CM | POA: Diagnosis not present

## 2020-03-26 ENCOUNTER — Telehealth: Payer: Self-pay

## 2020-03-26 DIAGNOSIS — R457 State of emotional shock and stress, unspecified: Secondary | ICD-10-CM | POA: Diagnosis not present

## 2020-03-26 DIAGNOSIS — R52 Pain, unspecified: Secondary | ICD-10-CM | POA: Diagnosis not present

## 2020-03-26 NOTE — Telephone Encounter (Signed)
Pt left message on triage line. Unable to understand her message. Returned her call and she hung up right after answering. Called her again and left VM to call us if she still needs assistance.

## 2020-03-27 DIAGNOSIS — E11621 Type 2 diabetes mellitus with foot ulcer: Secondary | ICD-10-CM | POA: Diagnosis not present

## 2020-03-27 DIAGNOSIS — Z743 Need for continuous supervision: Secondary | ICD-10-CM | POA: Diagnosis not present

## 2020-03-27 DIAGNOSIS — R279 Unspecified lack of coordination: Secondary | ICD-10-CM | POA: Diagnosis not present

## 2020-03-27 DIAGNOSIS — R52 Pain, unspecified: Secondary | ICD-10-CM | POA: Diagnosis not present

## 2020-03-27 DIAGNOSIS — Z992 Dependence on renal dialysis: Secondary | ICD-10-CM | POA: Diagnosis not present

## 2020-03-27 DIAGNOSIS — R4182 Altered mental status, unspecified: Secondary | ICD-10-CM | POA: Diagnosis not present

## 2020-03-27 DIAGNOSIS — R404 Transient alteration of awareness: Secondary | ICD-10-CM | POA: Diagnosis not present

## 2020-03-27 DIAGNOSIS — N186 End stage renal disease: Secondary | ICD-10-CM | POA: Diagnosis not present

## 2020-03-27 DIAGNOSIS — N2581 Secondary hyperparathyroidism of renal origin: Secondary | ICD-10-CM | POA: Diagnosis not present

## 2020-03-27 DIAGNOSIS — Z5181 Encounter for therapeutic drug level monitoring: Secondary | ICD-10-CM | POA: Diagnosis not present

## 2020-03-27 DIAGNOSIS — D631 Anemia in chronic kidney disease: Secondary | ICD-10-CM | POA: Diagnosis not present

## 2020-03-29 DIAGNOSIS — D631 Anemia in chronic kidney disease: Secondary | ICD-10-CM | POA: Diagnosis not present

## 2020-03-29 DIAGNOSIS — Z992 Dependence on renal dialysis: Secondary | ICD-10-CM | POA: Diagnosis not present

## 2020-03-29 DIAGNOSIS — E11621 Type 2 diabetes mellitus with foot ulcer: Secondary | ICD-10-CM | POA: Diagnosis not present

## 2020-03-29 DIAGNOSIS — N2581 Secondary hyperparathyroidism of renal origin: Secondary | ICD-10-CM | POA: Diagnosis not present

## 2020-03-29 DIAGNOSIS — N186 End stage renal disease: Secondary | ICD-10-CM | POA: Diagnosis not present

## 2020-04-03 ENCOUNTER — Other Ambulatory Visit: Payer: Self-pay

## 2020-04-03 ENCOUNTER — Inpatient Hospital Stay (HOSPITAL_COMMUNITY)
Admission: RE | Admit: 2020-04-03 | Discharge: 2020-04-17 | DRG: 239 | Disposition: A | Discharge status: Skilled Nursing Facility | Payer: Medicare Other | Attending: Vascular Surgery | Admitting: Vascular Surgery

## 2020-04-03 ENCOUNTER — Inpatient Hospital Stay (HOSPITAL_COMMUNITY)
Admission: RE | Disposition: A | Discharge status: Skilled Nursing Facility | Payer: Self-pay | Source: Home / Self Care | Attending: Vascular Surgery

## 2020-04-03 DIAGNOSIS — N186 End stage renal disease: Secondary | ICD-10-CM | POA: Diagnosis not present

## 2020-04-03 DIAGNOSIS — Z7189 Other specified counseling: Secondary | ICD-10-CM

## 2020-04-03 DIAGNOSIS — Z20822 Contact with and (suspected) exposure to covid-19: Secondary | ICD-10-CM | POA: Diagnosis present

## 2020-04-03 DIAGNOSIS — L97419 Non-pressure chronic ulcer of right heel and midfoot with unspecified severity: Secondary | ICD-10-CM | POA: Diagnosis present

## 2020-04-03 DIAGNOSIS — F32A Depression, unspecified: Secondary | ICD-10-CM

## 2020-04-03 DIAGNOSIS — I255 Ischemic cardiomyopathy: Secondary | ICD-10-CM | POA: Diagnosis present

## 2020-04-03 DIAGNOSIS — Z751 Person awaiting admission to adequate facility elsewhere: Secondary | ICD-10-CM

## 2020-04-03 DIAGNOSIS — I120 Hypertensive chronic kidney disease with stage 5 chronic kidney disease or end stage renal disease: Secondary | ICD-10-CM | POA: Diagnosis not present

## 2020-04-03 DIAGNOSIS — Z89512 Acquired absence of left leg below knee: Secondary | ICD-10-CM

## 2020-04-03 DIAGNOSIS — Z79899 Other long term (current) drug therapy: Secondary | ICD-10-CM

## 2020-04-03 DIAGNOSIS — E11621 Type 2 diabetes mellitus with foot ulcer: Secondary | ICD-10-CM | POA: Diagnosis not present

## 2020-04-03 DIAGNOSIS — X838XXA Intentional self-harm by other specified means, initial encounter: Secondary | ICD-10-CM

## 2020-04-03 DIAGNOSIS — I7092 Chronic total occlusion of artery of the extremities: Secondary | ICD-10-CM | POA: Diagnosis present

## 2020-04-03 DIAGNOSIS — E669 Obesity, unspecified: Secondary | ICD-10-CM | POA: Diagnosis present

## 2020-04-03 DIAGNOSIS — N2581 Secondary hyperparathyroidism of renal origin: Secondary | ICD-10-CM | POA: Diagnosis present

## 2020-04-03 DIAGNOSIS — M79604 Pain in right leg: Secondary | ICD-10-CM

## 2020-04-03 DIAGNOSIS — Z992 Dependence on renal dialysis: Secondary | ICD-10-CM | POA: Diagnosis not present

## 2020-04-03 DIAGNOSIS — I70261 Atherosclerosis of native arteries of extremities with gangrene, right leg: Secondary | ICD-10-CM | POA: Diagnosis present

## 2020-04-03 DIAGNOSIS — Z89511 Acquired absence of right leg below knee: Secondary | ICD-10-CM

## 2020-04-03 DIAGNOSIS — D631 Anemia in chronic kidney disease: Secondary | ICD-10-CM | POA: Diagnosis present

## 2020-04-03 DIAGNOSIS — Z8249 Family history of ischemic heart disease and other diseases of the circulatory system: Secondary | ICD-10-CM

## 2020-04-03 DIAGNOSIS — I739 Peripheral vascular disease, unspecified: Secondary | ICD-10-CM | POA: Diagnosis present

## 2020-04-03 DIAGNOSIS — I70221 Atherosclerosis of native arteries of extremities with rest pain, right leg: Secondary | ICD-10-CM | POA: Diagnosis present

## 2020-04-03 DIAGNOSIS — L97413 Non-pressure chronic ulcer of right heel and midfoot with necrosis of muscle: Secondary | ICD-10-CM | POA: Diagnosis not present

## 2020-04-03 DIAGNOSIS — R531 Weakness: Secondary | ICD-10-CM

## 2020-04-03 DIAGNOSIS — E1152 Type 2 diabetes mellitus with diabetic peripheral angiopathy with gangrene: Principal | ICD-10-CM | POA: Diagnosis present

## 2020-04-03 DIAGNOSIS — E1122 Type 2 diabetes mellitus with diabetic chronic kidney disease: Secondary | ICD-10-CM | POA: Diagnosis present

## 2020-04-03 DIAGNOSIS — I69391 Dysphagia following cerebral infarction: Secondary | ICD-10-CM

## 2020-04-03 DIAGNOSIS — Z833 Family history of diabetes mellitus: Secondary | ICD-10-CM

## 2020-04-03 DIAGNOSIS — T189XXA Foreign body of alimentary tract, part unspecified, initial encounter: Secondary | ICD-10-CM | POA: Diagnosis not present

## 2020-04-03 DIAGNOSIS — Z515 Encounter for palliative care: Secondary | ICD-10-CM

## 2020-04-03 DIAGNOSIS — Z87891 Personal history of nicotine dependence: Secondary | ICD-10-CM

## 2020-04-03 DIAGNOSIS — E8889 Other specified metabolic disorders: Secondary | ICD-10-CM | POA: Diagnosis present

## 2020-04-03 DIAGNOSIS — Z6837 Body mass index (BMI) 37.0-37.9, adult: Secondary | ICD-10-CM

## 2020-04-03 DIAGNOSIS — D62 Acute posthemorrhagic anemia: Secondary | ICD-10-CM | POA: Diagnosis present

## 2020-04-03 DIAGNOSIS — N250 Renal osteodystrophy: Secondary | ICD-10-CM | POA: Diagnosis not present

## 2020-04-03 DIAGNOSIS — I70234 Atherosclerosis of native arteries of right leg with ulceration of heel and midfoot: Secondary | ICD-10-CM | POA: Diagnosis not present

## 2020-04-03 HISTORY — PX: ABDOMINAL AORTOGRAM W/LOWER EXTREMITY: CATH118223

## 2020-04-03 HISTORY — PX: PERIPHERAL VASCULAR BALLOON ANGIOPLASTY: CATH118281

## 2020-04-03 LAB — RENAL FUNCTION PANEL
Albumin: 2.8 g/dL — ABNORMAL LOW (ref 3.5–5.0)
Anion gap: 19 — ABNORMAL HIGH (ref 5–15)
BUN: 53 mg/dL — ABNORMAL HIGH (ref 6–20)
CO2: 20 mmol/L — ABNORMAL LOW (ref 22–32)
Calcium: 8.6 mg/dL — ABNORMAL LOW (ref 8.9–10.3)
Chloride: 97 mmol/L — ABNORMAL LOW (ref 98–111)
Creatinine, Ser: 12.23 mg/dL — ABNORMAL HIGH (ref 0.44–1.00)
GFR calc Af Amer: 4 mL/min — ABNORMAL LOW (ref >60)
GFR calc non Af Amer: 3 mL/min — ABNORMAL LOW (ref >60)
Glucose, Bld: 189 mg/dL — ABNORMAL HIGH (ref 70–99)
Phosphorus: 9.5 mg/dL — ABNORMAL HIGH (ref 2.5–4.6)
Potassium: 4.7 mmol/L (ref 3.5–5.1)
Sodium: 136 mmol/L (ref 135–145)

## 2020-04-03 LAB — GLUCOSE, CAPILLARY
Glucose-Capillary: 184 mg/dL — ABNORMAL HIGH (ref 70–99)
Glucose-Capillary: 177 mg/dL — ABNORMAL HIGH (ref 70–99)
Glucose-Capillary: 177 mg/dL — ABNORMAL HIGH (ref 70–99)

## 2020-04-03 LAB — CBC
HCT: 25.6 % — ABNORMAL LOW (ref 36.0–46.0)
Hemoglobin: 7.6 g/dL — ABNORMAL LOW (ref 12.0–15.0)
MCH: 28 pg (ref 26.0–34.0)
MCHC: 29.7 g/dL — ABNORMAL LOW (ref 30.0–36.0)
MCV: 94.5 fL (ref 80.0–100.0)
Platelets: 153 10*3/uL (ref 150–400)
RBC: 2.71 MIL/uL — ABNORMAL LOW (ref 3.87–5.11)
RDW: 17.3 % — ABNORMAL HIGH (ref 11.5–15.5)
WBC: 9.2 10*3/uL (ref 4.0–10.5)
nRBC: 0 % (ref 0.0–0.2)

## 2020-04-03 LAB — POCT I-STAT, CHEM 8
BUN: 63 mg/dL — ABNORMAL HIGH (ref 6–20)
Calcium, Ion: 0.99 mmol/L — ABNORMAL LOW (ref 1.15–1.40)
Chloride: 99 mmol/L (ref 98–111)
Creatinine, Ser: 13.2 mg/dL — ABNORMAL HIGH (ref 0.44–1.00)
Glucose, Bld: 194 mg/dL — ABNORMAL HIGH (ref 70–99)
HCT: 25 % — ABNORMAL LOW (ref 36.0–46.0)
Hemoglobin: 8.5 g/dL — ABNORMAL LOW (ref 12.0–15.0)
Potassium: 5.1 mmol/L (ref 3.5–5.1)
Sodium: 137 mmol/L (ref 135–145)
TCO2: 25 mmol/L (ref 22–32)

## 2020-04-03 LAB — POCT ACTIVATED CLOTTING TIME
Activated Clotting Time: 263 seconds
Activated Clotting Time: 213 seconds
Activated Clotting Time: 202 seconds
Activated Clotting Time: 180 seconds

## 2020-04-03 LAB — HCG, SERUM, QUALITATIVE: Preg, Serum: NEGATIVE

## 2020-04-03 SURGERY — ABDOMINAL AORTOGRAM W/LOWER EXTREMITY
Anesthesia: LOCAL | Laterality: Right

## 2020-04-03 MED ORDER — FENTANYL CITRATE (PF) 100 MCG/2ML IJ SOLN
INTRAMUSCULAR | Status: DC | PRN
  Administered 2020-04-03 (×3): 25 ug via INTRAVENOUS

## 2020-04-03 MED ORDER — DOXERCALCIFEROL 4 MCG/2ML IV SOLN
7.0000 ug | INTRAVENOUS | Status: DC
  Administered 2020-04-05 – 2020-04-12 (×2): 7 ug via INTRAVENOUS
  Filled 2020-04-03 (×4): qty 4

## 2020-04-03 MED ORDER — CLOPIDOGREL BISULFATE 75 MG PO TABS
75.0000 mg | ORAL_TABLET | Freq: Every day | ORAL | Status: DC
  Administered 2020-04-04 – 2020-04-17 (×11): 75 mg via ORAL
  Filled 2020-04-03 (×12): qty 1

## 2020-04-03 MED ORDER — MIDAZOLAM HCL 2 MG/2ML IJ SOLN
INTRAMUSCULAR | Status: AC
  Filled 2020-04-03: qty 2

## 2020-04-03 MED ORDER — LABETALOL HCL 5 MG/ML IV SOLN
INTRAVENOUS | Status: AC
  Filled 2020-04-03: qty 4

## 2020-04-03 MED ORDER — HEPARIN (PORCINE) IN NACL 1000-0.9 UT/500ML-% IV SOLN
INTRAVENOUS | Status: AC
  Filled 2020-04-03: qty 500

## 2020-04-03 MED ORDER — DEXAMETHASONE 6 MG PO TABS
6.0000 mg | ORAL_TABLET | Freq: Every day | ORAL | Status: DC
  Administered 2020-04-04: 6 mg via ORAL
  Filled 2020-04-03 (×3): qty 1

## 2020-04-03 MED ORDER — ONDANSETRON HCL 4 MG/2ML IJ SOLN
4.0000 mg | Freq: Four times a day (QID) | INTRAMUSCULAR | Status: DC | PRN
  Administered 2020-04-05: 4 mg via INTRAVENOUS
  Filled 2020-04-03 (×2): qty 2

## 2020-04-03 MED ORDER — LIDOCAINE HCL (PF) 1 % IJ SOLN
INTRAMUSCULAR | Status: AC
  Filled 2020-04-03: qty 30

## 2020-04-03 MED ORDER — ONDANSETRON HCL 4 MG PO TABS
4.0000 mg | ORAL_TABLET | Freq: Every day | ORAL | Status: DC | PRN
  Administered 2020-04-03 – 2020-04-06 (×2): 4 mg via ORAL
  Filled 2020-04-03 (×2): qty 1

## 2020-04-03 MED ORDER — SODIUM CHLORIDE 0.9 % IV SOLN: 250.0000 mL | INTRAVENOUS | Status: DC | PRN

## 2020-04-03 MED ORDER — POLYETHYLENE GLYCOL 3350 17 G PO PACK
17.0000 g | PACK | Freq: Every day | ORAL | Status: DC
  Administered 2020-04-10 – 2020-04-14 (×4): 17 g via ORAL
  Filled 2020-04-03 (×10): qty 1

## 2020-04-03 MED ORDER — RENA-VITE PO TABS
1.0000 | ORAL_TABLET | Freq: Every day | ORAL | Status: DC
  Administered 2020-04-03 – 2020-04-15 (×9): 1 via ORAL
  Filled 2020-04-03 (×14): qty 1

## 2020-04-03 MED ORDER — IODIXANOL 320 MG/ML IV SOLN
INTRAVENOUS | Status: DC | PRN
  Administered 2020-04-03: 95 mL

## 2020-04-03 MED ORDER — FENTANYL CITRATE (PF) 100 MCG/2ML IJ SOLN
INTRAMUSCULAR | Status: AC
  Filled 2020-04-03: qty 2

## 2020-04-03 MED ORDER — ONDANSETRON HCL 4 MG/2ML IJ SOLN
INTRAMUSCULAR | Status: AC
  Filled 2020-04-03: qty 2

## 2020-04-03 MED ORDER — METOPROLOL SUCCINATE ER 25 MG PO TB24
12.5000 mg | ORAL_TABLET | Freq: Every day | ORAL | Status: DC
  Administered 2020-04-04 – 2020-04-17 (×13): 12.5 mg via ORAL
  Filled 2020-04-03 (×14): qty 1

## 2020-04-03 MED ORDER — SEVELAMER CARBONATE 800 MG PO TABS: 800.0000 mg | ORAL_TABLET | Freq: Three times a day (TID) | ORAL | Status: DC

## 2020-04-03 MED ORDER — MIDAZOLAM HCL 2 MG/2ML IJ SOLN
INTRAMUSCULAR | Status: DC | PRN
  Administered 2020-04-03 (×3): 1 mg via INTRAVENOUS

## 2020-04-03 MED ORDER — INSULIN ASPART 100 UNIT/ML ~~LOC~~ SOLN
0.0000 [IU] | Freq: Three times a day (TID) | SUBCUTANEOUS | Status: DC
  Administered 2020-04-03: 3 [IU] via SUBCUTANEOUS
  Administered 2020-04-04: 2 [IU] via SUBCUTANEOUS
  Administered 2020-04-04: 11 [IU] via SUBCUTANEOUS
  Administered 2020-04-05: 3 [IU] via SUBCUTANEOUS
  Administered 2020-04-05: 11 [IU] via SUBCUTANEOUS
  Administered 2020-04-06: 3 [IU] via SUBCUTANEOUS
  Administered 2020-04-06: 1 [IU] via SUBCUTANEOUS
  Administered 2020-04-12 – 2020-04-16 (×5): 2 [IU] via SUBCUTANEOUS
  Administered 2020-04-17: 3 [IU] via SUBCUTANEOUS

## 2020-04-03 MED ORDER — LABETALOL HCL 5 MG/ML IV SOLN
INTRAVENOUS | Status: DC | PRN
  Administered 2020-04-03: 10 mg via INTRAVENOUS

## 2020-04-03 MED ORDER — INSULIN GLARGINE 100 UNIT/ML ~~LOC~~ SOLN
18.0000 [IU] | Freq: Every day | SUBCUTANEOUS | Status: DC
  Administered 2020-04-03 – 2020-04-07 (×5): 18 [IU] via SUBCUTANEOUS
  Filled 2020-04-03 (×7): qty 0.18

## 2020-04-03 MED ORDER — SODIUM CHLORIDE 0.9 % IV SOLN
62.5000 mg | INTRAVENOUS | Status: DC
  Administered 2020-04-11: 62.5 mg via INTRAVENOUS
  Filled 2020-04-03 (×2): qty 5

## 2020-04-03 MED ORDER — SODIUM CHLORIDE 0.9% FLUSH: 3.0000 mL | INTRAVENOUS | Status: DC | PRN

## 2020-04-03 MED ORDER — ONDANSETRON HCL 4 MG/2ML IJ SOLN
INTRAMUSCULAR | Status: DC | PRN
  Administered 2020-04-03: 4 mg via INTRAVENOUS

## 2020-04-03 MED ORDER — ATORVASTATIN CALCIUM 40 MG PO TABS
40.0000 mg | ORAL_TABLET | Freq: Every day | ORAL | Status: DC
  Administered 2020-04-10 – 2020-04-17 (×8): 40 mg via ORAL
  Filled 2020-04-03 (×14): qty 1

## 2020-04-03 MED ORDER — CALCITRIOL 0.25 MCG PO CAPS
1.5000 ug | ORAL_CAPSULE | ORAL | Status: DC
  Filled 2020-04-03: qty 3
  Filled 2020-04-03 (×3): qty 6

## 2020-04-03 MED ORDER — ASPIRIN EC 81 MG PO TBEC
81.0000 mg | DELAYED_RELEASE_TABLET | Freq: Every day | ORAL | Status: DC
  Administered 2020-04-04 – 2020-04-17 (×13): 81 mg via ORAL
  Filled 2020-04-03 (×13): qty 1

## 2020-04-03 MED ORDER — SODIUM CHLORIDE 0.9% FLUSH: 3.0000 mL | Freq: Two times a day (BID) | INTRAVENOUS | Status: DC

## 2020-04-03 MED ORDER — CLOPIDOGREL BISULFATE 75 MG PO TABS
300.0000 mg | ORAL_TABLET | Freq: Once | ORAL | Status: DC
  Filled 2020-04-03: qty 4

## 2020-04-03 MED ORDER — LABETALOL HCL 5 MG/ML IV SOLN: 10.0000 mg | INTRAVENOUS | Status: DC | PRN

## 2020-04-03 MED ORDER — HEPARIN SODIUM (PORCINE) 1000 UNIT/ML IJ SOLN
INTRAMUSCULAR | Status: AC
  Filled 2020-04-03: qty 1

## 2020-04-03 MED ORDER — SODIUM CHLORIDE 0.9% FLUSH
3.0000 mL | Freq: Two times a day (BID) | INTRAVENOUS | Status: DC
  Administered 2020-04-04 – 2020-04-17 (×18): 3 mL via INTRAVENOUS

## 2020-04-03 MED ORDER — CHLORHEXIDINE GLUCONATE CLOTH 2 % EX PADS
6.0000 | MEDICATED_PAD | Freq: Every day | CUTANEOUS | Status: DC
  Administered 2020-04-04 – 2020-04-10 (×6): 6 via TOPICAL

## 2020-04-03 MED ORDER — HEPARIN SODIUM (PORCINE) 1000 UNIT/ML DIALYSIS
20.0000 [IU]/kg | INTRAMUSCULAR | Status: DC | PRN
  Filled 2020-04-03: qty 2

## 2020-04-03 MED ORDER — HEPARIN SODIUM (PORCINE) 1000 UNIT/ML IJ SOLN
INTRAMUSCULAR | Status: DC | PRN
  Administered 2020-04-03: 9000 [IU] via INTRAVENOUS

## 2020-04-03 MED ORDER — LIDOCAINE-PRILOCAINE 2.5-2.5 % EX CREA
1.0000 "application " | TOPICAL_CREAM | CUTANEOUS | Status: DC | PRN
  Filled 2020-04-03: qty 5

## 2020-04-03 MED ORDER — SODIUM CHLORIDE 0.9 % IV SOLN: 100.0000 mL | INTRAVENOUS | Status: DC | PRN

## 2020-04-03 MED ORDER — OXYCODONE HCL 5 MG PO TABS
5.0000 mg | ORAL_TABLET | ORAL | Status: DC | PRN
  Administered 2020-04-03 – 2020-04-16 (×23): 5 mg via ORAL
  Filled 2020-04-03 (×23): qty 1

## 2020-04-03 MED ORDER — METHOCARBAMOL 500 MG PO TABS
500.0000 mg | ORAL_TABLET | Freq: Two times a day (BID) | ORAL | Status: DC
  Administered 2020-04-03 – 2020-04-17 (×26): 500 mg via ORAL
  Filled 2020-04-03 (×27): qty 1

## 2020-04-03 MED ORDER — CLOPIDOGREL BISULFATE 300 MG PO TABS
ORAL_TABLET | ORAL | Status: DC | PRN
  Administered 2020-04-03: 300 mg via ORAL

## 2020-04-03 MED ORDER — LIDOCAINE HCL (PF) 1 % IJ SOLN: 5.0000 mL | INTRAMUSCULAR | Status: DC | PRN

## 2020-04-03 MED ORDER — GABAPENTIN 100 MG PO CAPS
100.0000 mg | ORAL_CAPSULE | Freq: Two times a day (BID) | ORAL | Status: DC
  Filled 2020-04-03: qty 1

## 2020-04-03 MED ORDER — HEPARIN (PORCINE) IN NACL 1000-0.9 UT/500ML-% IV SOLN
INTRAVENOUS | Status: DC | PRN
  Administered 2020-04-03 (×2): 500 mL

## 2020-04-03 MED ORDER — HYDRALAZINE HCL 20 MG/ML IJ SOLN
5.0000 mg | INTRAMUSCULAR | Status: DC | PRN
  Filled 2020-04-03: qty 1

## 2020-04-03 MED ORDER — AMLODIPINE BESYLATE 10 MG PO TABS
10.0000 mg | ORAL_TABLET | Freq: Every day | ORAL | Status: DC
  Administered 2020-04-04 – 2020-04-17 (×11): 10 mg via ORAL
  Filled 2020-04-03 (×7): qty 1
  Filled 2020-04-03: qty 2
  Filled 2020-04-03 (×6): qty 1

## 2020-04-03 MED ORDER — ACETAMINOPHEN 325 MG PO TABS: 650.0000 mg | ORAL_TABLET | ORAL | Status: DC | PRN

## 2020-04-03 MED ORDER — PENTAFLUOROPROP-TETRAFLUOROETH EX AERO
1.0000 "application " | INHALATION_SPRAY | CUTANEOUS | Status: DC | PRN
  Filled 2020-04-03: qty 116

## 2020-04-03 SURGICAL SUPPLY — 16 items
BALLN JADE .035 5.0 X 60 (BALLOONS) ×2
BALLOON JADE .035 5.0 X 60 (BALLOONS) ×1 IMPLANT
CATH OMNI FLUSH 5F 65CM (CATHETERS) ×2 IMPLANT
CATH QUICKCROSS SUPP .035X90CM (MICROCATHETER) ×2 IMPLANT
DEVICE TORQUE .025-.038 (MISCELLANEOUS) ×2 IMPLANT
GLIDEWIRE ADV .035X260CM (WIRE) ×2 IMPLANT
KIT ENCORE 26 ADVANTAGE (KITS) ×2 IMPLANT
KIT MICROPUNCTURE NIT STIFF (SHEATH) ×2 IMPLANT
KIT PV (KITS) ×2 IMPLANT
SHEATH FLEX ANSEL ST 6FR 45CM (SHEATH) ×2 IMPLANT
SHEATH PINNACLE 5F 10CM (SHEATH) ×2 IMPLANT
SHEATH PROBE COVER 6X72 (BAG) ×2 IMPLANT
SYR MEDRAD MARK V 150ML (SYRINGE) ×2 IMPLANT
TRANSDUCER W/STOPCOCK (MISCELLANEOUS) ×2 IMPLANT
TRAY PV CATH (CUSTOM PROCEDURE TRAY) ×2 IMPLANT
WIRE BENTSON .035X145CM (WIRE) ×2 IMPLANT

## 2020-04-03 NOTE — Progress Notes (Addendum)
  Day of Surgery Note    Subjective:  Better now that she can sit up and eat   Vitals:   04/03/20 1410 04/03/20 1425  BP: (!) 166/60 (!) 153/82  Pulse: 93 95  Resp: (!) 21 (!) 25  Temp:    SpO2: 99% 99%     Extremities:  + doppler right PT, DP and peroneal signals Cardiac:  RRR Lungs:  nonlabored Left groin access site without hematoma  Assessment/Plan:  This is a 44 y.o. female who is s/p right proximal SFA angioplasty. Stable post-procedure. Appreciate nephrology consultation>patient to HD today. Right heel wound for debridement tomorrow  Risa Grill, PA-C 04/03/2020 4:11 PM [phone]

## 2020-04-03 NOTE — Progress Notes (Signed)
Patient noted sitting up near 90 degrees wanting to eat lunch.  Patient re-educated explaining the risk of bleeding or injury to groin.  Patient yelled "No, I don't lay me down, God will take care of me".  Patient tearful stating she hasn't eating since yesterday.  Assured patient we will sit her up as soon as we can. Patient noted self-adjusting bed.  Re-educated and repositioned.

## 2020-04-03 NOTE — Consult Note (Addendum)
[facility] Renal Consultation Note    Indication for Consultation:  Management of ESRD/hemodialysis; anemia, hypertension/volume and secondary hyperparathyroidism PCP:  HPI: Karen [name] is a 44 y.o. female with ESRD on hemodialysis MWF at [facility]. PMH: HTN, DMT2, ischemic cardiomyopathy, L BKA, AOCD, SHPT, noncompliance with HD. Last HD 03/29/2020, ran full tx, left 0.4 kg above OP EDW.  She was admitted today per [facility] for necrotic R heel. She is S/P [place] access L common femoral artery, aortagram, RLE arteriogram, R SFA PTA per Dr. Carlis Abbott. Seen in holding area. She is still slightly lethargic from sedation but oriented X 3. Today [birth year] birthday. C/O pain in R heel, otherwise no C/O. Plan is to stay overnight and have further debridement of R foot tomorrow. Started on plavix and ASA, attempting limb salvage.   Past Medical History:  Diagnosis Date  . Anemia   . Anxiety   . Dyspnea    "when I dont go to dialysis"  . Eczema   . ESRD (end stage renal disease) ([place])    Hemo - [facility]- [place]  . Exertional shortness of breath    "recently; it's fluid" (02/02/2013)  . GERD (gastroesophageal reflux disease)   . Headache   . History of blood transfusion    "last week" (02/02/2013)  . History of kidney stones    passed  . Hypertension   . Ischemic cardiomyopathy    by echo 2014  . Neuropathy   . Osteomyelitis of toe of left foot ([place])    "off and on since 2009; no OR" (02/02/2013)  . Pneumonia 09/2016  . Type II diabetes mellitus ([place]) 1995   Past Surgical History:  Procedure Laterality Date  . AMPUTATION Left 02/06/2013   Procedure: AMPUTATION LEFT GREAT TOE;  Surgeon: Wylene Simmer, MD;  Location: [place];  Service: Orthopedics;  Laterality: Left;  . AMPUTATION Left 06/24/2013   Procedure: AMPUTATION BELOW KNEE ;  Surgeon: Wylene Simmer, MD;  Location: [place];  Service: Orthopedics;  Laterality: Left;  . AV FISTULA PLACEMENT Right 02/08/2017   Procedure: CREATION  OF RIGHT ARM  BASILIC VEIN TO BRACHIAL ARTERY ARTERIOVENOUS (AV) FISTULA;  Surgeon: Rosetta Posner, MD;  Location: [facility] [facility];  Service: Vascular;  Laterality: Right;  . AV FISTULA PLACEMENT Left 09/10/2017   Procedure: INSERTION OF ARTERIOVENOUS (AV) GORE-TEX GRAFT  LEFT UPPER ARM;  Surgeon: Angelia Mould, MD;  Location: [place];  Service: Vascular;  Laterality: Left;  . BASCILIC VEIN TRANSPOSITION Right 04/14/2017   Procedure: BASCILIC VEIN TRANSPOSITION-RIGHT 2ND STAGE;  Surgeon: Rosetta Posner, MD;  Location: [place];  Service: Vascular;  Laterality: Right;  . CESAREAN SECTION  10/18/2006  . INSERTION OF DIALYSIS CATHETER     Family History  Problem Relation Age of Onset  . Hypertension Mother   . Diabetes Mother   . Diabetes Father    Social History:  reports that she quit smoking about 15 years ago. Her smoking use included cigarettes. She has a 30.00 pack-year smoking history. She has never used smokeless tobacco. She reports that she does not drink alcohol and does not use drugs. Allergies  Allergen Reactions  . Baclofen Itching  . Penicillins Anaphylaxis, Hives, Rash and Other (See Comments)    PATIENT HAD A PCN REACTION WITH IMMEDIATE RASH, FACIAL/TONGUE/THROAT SWELLING, SOB, OR LIGHTHEADEDNESS WITH HYPOTENSION:  #  #  #  YES  #  #  #   SEVERE RASH INVOLVING MUCUS MEMBRANES or SKIN NECROSIS: #  #  #  YES  #  #  # Has patient had a PCN reaction that required hospitalization No Has patient had a PCN reaction occurring within the last 10 years: No If all of the above answers are "NO", then may proceed with Cephalosporin use.  09/10/16- tolerated Cefepime   . Morphine And Related Hives and Rash  . Novolog [Insulin Aspart] Other (See Comments)    Cramps/ Gi distress   Prior to Admission medications   Medication Sig Start Date End Date Taking? Authorizing Provider  amLODipine (NORVASC) 10 MG tablet Take 10 mg by mouth daily.   Yes [provider]  aspirin 325 MG tablet  Take 1 tablet (325 mg total) by mouth daily. 09/16/17  Yes Melanee Spry, MD  aspirin EC 81 MG tablet Take 81 mg by mouth daily.   Yes [provider]  atorvastatin (LIPITOR) 40 MG tablet Take by mouth. 06/08/19  Yes [provider]  calcitRIOL (ROCALTROL) 0.5 MCG capsule Take 3 capsules (1.5 mcg total) by mouth Every Tuesday,Thursday,and Saturday with dialysis. 11/13/17  Yes Debbe Odea, MD  calcium carbonate (OS-CAL) 1250 (500 Ca) MG chewable tablet Chew by mouth. 01/14/16  Yes [provider]  cinacalcet (SENSIPAR) 30 MG tablet Take 1 tablet (30 mg total) by mouth Every Tuesday,Thursday,and Saturday with dialysis. Patient taking differently: Take 30 mg by mouth every Monday, Wednesday, and Friday with hemodialysis.  11/13/17  Yes Debbe Odea, MD  dexamethasone (DECADRON) 6 MG tablet Take 6 mg by mouth daily. 01/24/20  Yes [provider]  ferrous sulfate 325 (65 FE) MG tablet Take by mouth. 01/24/20  Yes [provider]  hydrALAZINE (APRESOLINE) 25 MG tablet Take by mouth. 06/08/19  Yes [provider]  insulin glargine (LANTUS) 100 UNIT/ML injection Inject 18 Units into the skin at bedtime. 01/14/16  Yes [provider]  insulin regular (HUMULIN R) 100 units/mL injection AS PER SLIDING SCALE AS DIRECTED FOUR TIMES DAILY 10/30/19  Yes [provider]  methocarbamol (ROBAXIN) 500 MG tablet TAKE 1 TABLET BY MOUTH TWICE DAILY 01/15/20  Yes [provider]  metoprolol succinate (TOPROL-XL) 25 MG 24 hr tablet Take 0.5 tablets (12.5 mg total) by mouth daily. 07/10/18 02/20/28 Yes Bonnielee Haff, MD  multivitamin (RENA-VIT) TABS tablet Take 1 tablet by mouth at bedtime. 11/13/17  Yes Debbe Odea, MD  ondansetron (ZOFRAN) 4 MG tablet Take 4 mg by mouth daily as needed for nausea or vomiting.   Yes [provider]  oxyCODONE (OXY IR/ROXICODONE) 5 MG immediate release tablet Take by mouth. 10/25/18  Yes [provider]  pantoprazole (PROTONIX) 20 MG tablet TAKE 1 TABLET(20 MG) BY MOUTH EVERY DAY 02/06/20  Yes Lillard Anes, MD  sevelamer carbonate (RENVELA) 800 MG tablet Take 800 mg by mouth 3 (three) times daily with meals.   Yes [provider]  Darbepoetin Alfa (ARANESP) 200 MCG/0.4ML SOSY injection Inject 0.4 mLs (200 mcg total) into the vein every Thursday with hemodialysis. Patient not taking: Reported on 07/09/2018 02/03/18   Bonnita Hollow, MD  ferric gluconate 62.5 mg in sodium chloride 0.9 % 100 mL Inject 62.5 mg into the vein every Thursday with hemodialysis. Patient not taking: Reported on 07/09/2018 02/03/18   Bonnita Hollow, MD  gabapentin (NEURONTIN) 100 MG capsule TAKE 1 CAPSULE BY MOUTH EVERY DAY IN THE EVENING 01/01/20   [provider]  lidocaine-prilocaine (EMLA) cream APPLY SMALL AMOUNT TO ACCESS SITE (AVF) 1 TO 2 HOURS BEFORE DIALYSIS. COVER WITH OCCLUSIVE  DRESSING [place] WRAP) 05/02/19   [provider]  Methoxy PEG-Epoetin Beta (MIRCERA IJ) Mircera 02/07/20 02/05/21  [provider]  naloxone (NARCAN) 4 MG/0.1ML LIQD nasal spray kit INSTILL 1 SPRAY IN ONCE NOSTRIL REPEAT AFTER 3 MINUTES IF NO OR [place] RESPONSE 01/18/20   [provider]  NOVOFINE 32G X 6 MM MISC  02/01/20   [provider]  polyethylene glycol (MIRALAX / GLYCOLAX) packet Take 17 g by mouth daily. 02/02/18   Bonnita Hollow, MD   Current Facility-Administered Medications  Medication Dose Route Frequency Provider Last Rate Last Admin  . 0.9 %  sodium chloride infusion  250 mL Intravenous PRN Marty Heck, MD      . sodium chloride flush (NS) 0.9 % injection 3 mL  3 mL Intravenous Q12H Marty Heck, MD      . sodium chloride flush (NS) 0.9 % injection 3 mL  3 mL Intravenous PRN Marty Heck, MD       Labs: Basic Metabolic Panel: Recent Labs  Lab 04/03/20 0911  NA 137  K 5.1  CL 99  GLUCOSE 194*  BUN 63*   CREATININE 13.20*   Liver Function Tests: No results for input(s): AST, ALT, ALKPHOS, BILITOT, PROT, ALBUMIN in the last 168 hours. No results for input(s): LIPASE, AMYLASE in the last 168 hours. No results for input(s): AMMONIA in the last 168 hours. CBC: Recent Labs  Lab 04/03/20 0911  HGB 8.5*  HCT 25.0*   Cardiac Enzymes: No results for input(s): CKTOTAL, CKMB, CKMBINDEX, TROPONINI in the last 168 hours. CBG: Recent Labs  Lab 04/03/20 1211  GLUCAP 184*   Iron Studies: No results for input(s): IRON, TIBC, TRANSFERRIN, FERRITIN in the last 72 hours. Studies/Results: PERIPHERAL VASCULAR CATHETERIZATION  Result Date: 04/03/2020 Patient name: Karen [name] MRN: [id number]        DOB: Jan 15, 1976          Sex: female  04/03/2020 Pre-operative Diagnosis: Necrotic right heel wound Post-operative diagnosis:  Same Surgeon:  Marty Heck, MD Procedure Performed: 1.  Ultrasound-guided access of left common femoral artery 2.  Aortogram including catheter selection of aorta 3.  Right lower extremity arteriogram with selection of third order branches 4.  Right proximal SFA angioplasty (5 mm x 60 mm Jade angioplasty baloon) 5.  39 minutes monitored moderate conscious sedation time  Indications: Patient is a 44 year old female with end-stage renal disease, left BKA, ischemic cardiomyopathy and diabetes who presents with large necrotic right ankle wound.  I previously discussed I am not sure this leg is salvageable given the significant tissue loss and difficulty with any future soft tissue coverage.  She is adamantly against amputation and presents for arteriogram possible intervention and any attempt at limb salvage.  Findings:  Aortogram showed no flow-limiting stenosis in the aortoiliac segment.   Ultimately the bifurcation was crossed and right leg arteriogram was performed given the patient was very uncooperative during the procedure.  Patient had a patent common femoral and profunda with  a proximal SFA chronic total occlusion with a short stump.  The remainder of the SFA was patent and diffusely diseased but no flow-limiting stenosis.  The popliteal artery was patent and two-vessel runoff through the peroneal and posterior tibial artery.  Ultimately the right SFA proximal occlusion was crossed and balloon angioplasty was performed of the proximal SFA with a 5 mm x 60 mm Jade balloon.  She now has inline flow down the right lower extremity.  Patient  was very uncooperative throughout the case and moving and made intervention difficult.             Procedure:  The patient was identified in the holding area and taken to room 8.  The patient was then placed supine on the table and prepped and draped in the usual sterile fashion.  A time out was called.  Ultrasound was used to evaluate the left common femoral artery.  It was patent .  A digital ultrasound image was acquired.  A micropuncture needle was used to access the left common femoral artery under ultrasound guidance.  An 018 wire was advanced without resistance and a micropuncture sheath was placed.  The 018 wire was removed and a benson wire was placed.  The micropuncture sheath was exchanged for a 5 french sheath.  An omniflush catheter was advanced over the wire to the level of L-1.  An abdominal angiogram was obtained.  Next, using the omniflush catheter and a benson wire, the aortic bifurcation was crossed and the catheter was placed into theright external iliac artery and right runoff was obtained.  Please note we had to crossover because patient was so uncooperative and would not sit still during the study even with moderate conscious sedation.  Ultimately in order to give her the best chance to heal her foot wound elected to try and cross her proximal SFA occlusion.  A glidewire advantage was used to exchange for a six [place] Ansell sheath in the left groin over the aortic bifurcation.  Patient was given 100 units per kilogram heparin.   Then used a Glidewire advantage with short 035 quick cross and crossed the proximal SFA occlusion on the right and back into the true lumen confirmed hand-injection.  The proximal SFA was then balloon angioplastied with a 5 mm x 60 mm Jade balloon to nominal pressure for 2 minutes.  She has now inline flow down the right lower extremity with preserved runoff through the peroneal and posterior tibial.  I did not elect any more complex intervention given patient was extremely uncooperative moving constantly and screaming throughout the case.  Plan: Will be loaded on Plavix and taking aspirin daily.  Plan right foot debridement tomorrow.  High risk for limb loss.     Marty Heck, MD [facility]: [phone]   ROS: As per HPI otherwise negative.   Physical Exam: Vitals:   04/03/20 1200 04/03/20 1215 04/03/20 1225 04/03/20 1255  BP: (!) 164/66 (!) 160/80 (!) 138/39 (!) 165/73  Pulse: 96 95 94 95  Resp: (!) 25 (!) 26 (!) 22 (!) 23  Temp:      TempSrc:      SpO2: 91% 97% 96% 94%  Weight:      Height:         General: Chronically ill appearing female in no acute distress. Head: Normocephalic, atraumatic, sclera non-icteric, mucus membranes are moist Neck: Supple. JVD not elevated. Lungs: Clear bilaterally to auscultation without wheezes, rales, or rhonchi. Breathing is unlabored. Heart: RRR with S1 S2. No murmurs, rubs, or gallops appreciated. Abdomen: Soft, non-tender, non-distended with normoactive bowel sounds. No rebound/guarding. No obvious abdominal masses. M-S:  Strength and tone appear normal for age. Lower extremities: L BKA no stump edema. Trace edema RLE. Necrotic appearing wound on outer aspect of R heel,foul odor.  Neuro: Alert and oriented X 3. Moves all extremities spontaneously. Psych:  Responds to questions appropriately with a normal affect. Dialysis Access: LUA AVG +  bruit  Dialysis Orders: [facility] MWF 4 hrs 180NRe  400/800 99.5 kg 2.0 K/ 2.25 Ca UFP 2 L AVG -Heparin 6000 units IV TIW -Hectorol 7 mcg IV TIW -Mircera 150 mcg IV q 2 weeks (last dose 03/22/2020 Last HGB 7.7 03/27/20)  Assessment/Plan: 1.  Necrotic R Heel/Critical Limb Ischemia-per [facility]VS. Attempting limb salvage as she is refusing amputation.  2.  ESRD -  MWF-HD today. K+ 5.1. Use tight heparin today.  3.  Hypertension/volume  - BP elevated in holding area. Trace LE edema. Today's weight is under OP EDW 94.8-doubt accuracy. Attempt 2-3 liters in HD today. Amlodipine 5 mg PO q day and hydralazine 25 mg PO BID on OP med list. Resume home BP meds when able to take POs.  4.  Anemia  - HGB 8.5 but this is a H & H. Recheck CBC in HD. Recent ESA dose 03/27/2020 5.  Metabolic bone disease -  Last OP PO4 elevated. Usually noncompliant with binders. Resume binders when eating, continue VDRA.   6.  Nutrition - NPO at present 7.  DM-per primary  Jimmye Norman. Owens Shark, NP-C 04/03/2020, 1:36 PM  [employer] [phone]

## 2020-04-03 NOTE — Progress Notes (Signed)
Patient arrived to 4E from the Cath Lab.  CCMD notified.  CHG bath given.   Left groin access.  Level 0.  Vital signs taken.  Patient educated by both RN's the requirement of laying flat post procedure.

## 2020-04-03 NOTE — H&P (Signed)
History and Physical Interval Note:  04/03/2020 9:38 AM  Karen [name]  has presented today for surgery, with the diagnosis of PAD.  The various methods of treatment have been discussed with the patient and family. After consideration of risks, benefits and other options for treatment, the patient has consented to  Procedure(s): ABDOMINAL AORTOGRAM W/LOWER EXTREMITY (Right) as a surgical intervention.  The patient's history has been reviewed, patient examined, no change in status, stable for surgery.  I have reviewed the patient's chart and labs.  Questions were answered to the patient's satisfaction.    Aortogram, lower extremity arteriogram.  Not sure the right foot is salvageable at this point but will evaluate runoff.    Marty Heck  Patient name: Karen [name] MRN: [id number]        DOB: 26-Feb-1976          Sex: female  REASON FOR CONSULT: Evaluate right heel wound  HPI: Karen [name] is a 44 y.o. female, with history of end-stage renal disease on hemodialysis Monday Wednesday Friday, HTN, L BKA, PAD, ischemic cardiomyopathy, diabetes, previous stroke with some ongoing dysphagia the presents for evaluation of right heel wound.  Patient states this developed about 2 and half weeks ago and ultimately she was referred here by her podiatrist.  She has a history of a left BKA.  She denies any previous interventions in the right leg.  She states she does stand and transfer with a left leg prosthesis.  Has noted a smell with her right heel wound.      Past Medical History:  Diagnosis Date  . Anemia   . Anxiety   . Dyspnea    "when I dont go to dialysis"  . Eczema   . ESRD (end stage renal disease) ([place])    Hemo - [facility]- [place]  . Exertional shortness of breath    "recently; it's fluid" (02/02/2013)  . GERD (gastroesophageal reflux disease)   . Headache   . History of blood transfusion    "last week" (02/02/2013)  . History of kidney stones    passed  .  Hypertension   . Ischemic cardiomyopathy    by echo 2014  . Neuropathy   . Osteomyelitis of toe of left foot ([place])    "off and on since 2009; no OR" (02/02/2013)  . Pneumonia 09/2016  . Type II diabetes mellitus ([place]) 1995    Past Surgical History:  Procedure Laterality Date  . AMPUTATION Left 02/06/2013   Procedure: AMPUTATION LEFT GREAT TOE;  Surgeon: Wylene Simmer, MD;  Location: [facility];  Service: Orthopedics;  Laterality: Left;  . AMPUTATION Left 06/24/2013   Procedure: AMPUTATION BELOW KNEE ;  Surgeon: Wylene Simmer, MD;  Location: [facility];  Service: Orthopedics;  Laterality: Left;  . AV FISTULA PLACEMENT Right 02/08/2017   Procedure: CREATION OF RIGHT ARM  BASILIC VEIN TO BRACHIAL ARTERY ARTERIOVENOUS (AV) FISTULA;  Surgeon: Rosetta Posner, MD;  Location: [facility];  Service: Vascular;  Laterality: Right;  . AV FISTULA PLACEMENT Left 09/10/2017   Procedure: INSERTION OF ARTERIOVENOUS (AV) GORE-TEX GRAFT  LEFT UPPER ARM;  Surgeon: Angelia Mould, MD;  Location: [facility];  Service: Vascular;  Laterality: Left;  . BASCILIC VEIN TRANSPOSITION Right 04/14/2017   Procedure: BASCILIC VEIN TRANSPOSITION-RIGHT 2ND STAGE;  Surgeon: Rosetta Posner, MD;  Location: [facility];  Service: Vascular;  Laterality: Right;  . CESAREAN SECTION  10/18/2006  . INSERTION OF DIALYSIS CATHETER  Family History  Problem Relation Age of Onset  . Hypertension Mother   . Diabetes Mother   . Diabetes Father     SOCIAL HISTORY: Social History        Socioeconomic History  . Marital status: Single    Spouse name: Not on file  . Number of children: Not on file  . Years of education: Not on file  . Highest education level: Not on file  Occupational History  . Not on file  Tobacco Use  . Smoking status: Former Smoker    Packs/day: 3.00    Years: 10.00    Pack years: 30.00    Types: Cigarettes    Quit date: 09/21/2004    Years since quitting: 15.5  . Smokeless tobacco:  Never Used  Vaping Use  . Vaping Use: Never used  Substance and Sexual Activity  . Alcohol use: No  . Drug use: No  . Sexual activity: Never    Birth control/protection: None  Other Topics Concern  . Not on file  Social History Narrative  . Not on file   Social Determinants of Health      Financial Resource Strain:   . Difficulty of Paying Living Expenses:   Food Insecurity:   . Worried About Charity fundraiser in the Last Year:   . Arboriculturist in the Last Year:   Transportation Needs:   . Film/video editor (Medical):   Marland Kitchen Lack of Transportation (Non-Medical):   Physical Activity:   . Days of Exercise per Week:   . Minutes of Exercise per Session:   Stress:   . Feeling of Stress :   Social Connections:   . Frequency of Communication with Friends and Family:   . Frequency of Social Gatherings with Friends and Family:   . Attends Religious Services:   . Active Member of Clubs or Organizations:   . Attends Archivist Meetings:   Marland Kitchen Marital Status:   Intimate Partner Violence:   . Fear of Current or Ex-Partner:   . Emotionally Abused:   Marland Kitchen Physically Abused:   . Sexually Abused:          Allergies  Allergen Reactions  . Baclofen Itching  . Penicillins Anaphylaxis, Hives, Rash and Other (See Comments)    PATIENT HAD A PCN REACTION WITH IMMEDIATE RASH, FACIAL/TONGUE/THROAT SWELLING, SOB, OR LIGHTHEADEDNESS WITH HYPOTENSION:  #  #  #  YES  #  #  #   SEVERE RASH INVOLVING MUCUS MEMBRANES or SKIN NECROSIS: #  #  #  YES  #  #  # Has patient had a PCN reaction that required hospitalization No Has patient had a PCN reaction occurring within the last 10 years: No If all of the above answers are "NO", then may proceed with Cephalosporin use.  09/10/16- tolerated Cefepime   . Morphine And Related Hives and Rash  . Novolog [Insulin Aspart] Other (See Comments)    Cramps/ Gi distress          Current Outpatient Medications  Medication Sig  Dispense Refill  . amLODipine (NORVASC) 10 MG tablet Take 10 mg by mouth daily.    Marland Kitchen aspirin 325 MG tablet Take 1 tablet (325 mg total) by mouth daily. 30 tablet 0  . aspirin EC 81 MG tablet Take 81 mg by mouth daily.    Marland Kitchen atorvastatin (LIPITOR) 40 MG tablet Take by mouth.    . calcitRIOL (ROCALTROL) 0.5 MCG capsule Take 3 capsules (  1.5 mcg total) by mouth Every Tuesday,Thursday,and Saturday with dialysis. 90 capsule 0  . calcium carbonate (OS-CAL) 1250 (500 Ca) MG chewable tablet Chew by mouth.    . cinacalcet (SENSIPAR) 30 MG tablet Take 1 tablet (30 mg total) by mouth Every Tuesday,Thursday,and Saturday with dialysis. (Patient taking differently: Take 30 mg by mouth every Monday, Wednesday, and Friday with hemodialysis. ) 60 tablet 0  . dexamethasone (DECADRON) 6 MG tablet Take 6 mg by mouth daily.    . ferrous sulfate 325 (65 FE) MG tablet Take by mouth.    . gabapentin (NEURONTIN) 100 MG capsule TAKE 1 CAPSULE BY MOUTH EVERY DAY IN THE EVENING    . hydrALAZINE (APRESOLINE) 25 MG tablet Take by mouth.    . insulin glargine (LANTUS) 100 UNIT/ML injection Inject 18 Units into the skin at bedtime.    . insulin regular (HUMULIN R) 100 units/mL injection AS PER SLIDING SCALE AS DIRECTED FOUR TIMES DAILY    . lidocaine-prilocaine (EMLA) cream APPLY SMALL AMOUNT TO ACCESS SITE (AVF) 1 TO 2 HOURS BEFORE DIALYSIS. COVER WITH OCCLUSIVE DRESSING (SARAN WRAP)    . methocarbamol (ROBAXIN) 500 MG tablet TAKE 1 TABLET BY MOUTH TWICE DAILY    . Methoxy PEG-Epoetin Beta (MIRCERA IJ) Mircera    . metoprolol succinate (TOPROL-XL) 25 MG 24 hr tablet Take 0.5 tablets (12.5 mg total) by mouth daily. 15 tablet 0  . multivitamin (RENA-VIT) TABS tablet Take 1 tablet by mouth at bedtime. 30 tablet 0  . naloxone (NARCAN) 4 MG/0.1ML LIQD nasal spray kit INSTILL 1 SPRAY IN ONCE NOSTRIL REPEAT AFTER 3 MINUTES IF NO OR MINIMUL RESPONSE    . NOVOFINE 32G X 6 MM MISC     . ondansetron (ZOFRAN)  4 MG tablet Take 4 mg by mouth daily as needed for nausea or vomiting.    Marland Kitchen oxyCODONE (OXY IR/ROXICODONE) 5 MG immediate release tablet Take by mouth.    . pantoprazole (PROTONIX) 20 MG tablet TAKE 1 TABLET(20 MG) BY MOUTH EVERY DAY 90 tablet 2  . polyethylene glycol (MIRALAX / GLYCOLAX) packet Take 17 g by mouth daily. 14 each 0  . sevelamer carbonate (RENVELA) 800 MG tablet Take 800 mg by mouth 3 (three) times daily with meals.    . Darbepoetin Alfa (ARANESP) 200 MCG/0.4ML SOSY injection Inject 0.4 mLs (200 mcg total) into the vein every Thursday with hemodialysis. (Patient not taking: Reported on 07/09/2018) 1.68 mL   . ferric gluconate 62.5 mg in sodium chloride 0.9 % 100 mL Inject 62.5 mg into the vein every Thursday with hemodialysis. (Patient not taking: Reported on 07/09/2018)     No current facility-administered medications for this visit.    REVIEW OF SYSTEMS:  [X]  denotes positive finding, [ ]  denotes negative finding Cardiac  Comments:  Chest pain or chest pressure:    Shortness of breath upon exertion:    Short of breath when lying flat:    Irregular heart rhythm:        Vascular    Pain in calf, thigh, or hip brought on by ambulation:    Pain in feet at night that wakes you up from your sleep:     Blood clot in your veins:    Leg swelling:         Pulmonary    Oxygen at home:    Productive cough:     Wheezing:         Neurologic    Sudden weakness in arms or legs:  Sudden numbness in arms or legs:     Sudden onset of difficulty speaking or slurred speech:    Temporary loss of vision in one eye:     Problems with dizziness:         Gastrointestinal    Blood in stool:     Vomited blood:         Genitourinary    Burning when urinating:     Blood in urine:        Psychiatric    Major depression:         Hematologic    Bleeding problems:    Problems with blood  clotting too easily:        Skin    Rashes or ulcers:        Constitutional    Fever or chills:      PHYSICAL EXAM:    Vitals:   03/19/20 1009  BP: (!) 193/103  Pulse: 97  Resp: 16  Temp: (!) 97.5 F (36.4 C)  TempSrc: Temporal  Weight: 209 lb (94.8 kg)  Height: _0  (1.676 m)    GENERAL: The patient is a well-nourished female, in no acute distress. The vital signs are documented above. CARDIAC: There is a regular rate and rhythm.  VASCULAR:  Cannot palpate a left femoral pulse but may be limited by pannus and positioning in wheelchair I can appreciate a right femoral pulse Large right heel wound that is necrotic as pictured below PULMONARY: There is good air exchange bilaterally without wheezing or rales. ABDOMEN: Soft and non-tender with normal pitched bowel sounds.  MUSCULOSKELETAL: There are no major deformities or cyanosis. NEUROLOGIC: No focal weakness or paresthesias are detected. SKIN: There are no ulcers or rashes noted. PSYCHIATRIC: The patient has a normal affect.      DATA:   ABIs are unreliable given noncompressible vessels  Right leg arterial duplex shows monophasic flow in the common femoral with absent flow in the SFA given monophasic flow in the popliteal artery  Assessment/Plan:  44 year old female with multiple medical problems including diabetes and end-stage renal disease that presents with large necrotic right heel wound as pictured above.  I discussed with patient in detail that I think she is at exceedingly high risk for limb loss even with surgical intervention given the amount of soft tissue destruction on exam as pictured above.  After debridement of her heel wound it would be extremely difficult to get any soft tissue coverage and have any expectation this would heal.  She is against any thought of amputation at this time given that she has a contralateral amputation on the left leg.  I discussed next step would  then be arteriogram to evaluate for any options for revascularization.  I can appreciate a right femoral pulse but have a hard time feeling a left femoral pulse and discussed this may be a right groin access and just get the runoff to see what option she has versus evaluate her left groin with ultrasound.  We will schedule on Thursday on a nondialysis day.  Risk benefits were discussed in detail including risk of vessel injury bleeding infection etc.   Marty Heck, MD [facility] Office: [phone]

## 2020-04-03 NOTE — Op Note (Signed)
Patient name: Karen [name] MRN: [id number] DOB: 04-27-1976 Sex: female  04/03/2020 Pre-operative Diagnosis: Necrotic right heel wound Post-operative diagnosis:  Same Surgeon:  Marty Heck, MD Procedure Performed: 1.  Ultrasound-guided access of left common femoral artery 2.  Aortogram including catheter selection of aorta 3.  Right lower extremity arteriogram with selection of third order branches 4.  Right proximal SFA angioplasty (5 mm x 60 mm Jade angioplasty baloon)  5.  39 minutes monitored moderate conscious sedation time  Indications: Patient is a 44 year old female with end-stage renal disease, left BKA, ischemic cardiomyopathy and diabetes who presents with large necrotic right ankle wound.  I previously discussed I am not sure this leg is salvageable given the significant tissue loss and difficulty with any future soft tissue coverage.  She is adamantly against amputation and presents for arteriogram possible intervention and any attempt at limb salvage.  Findings:   Aortogram showed no flow-limiting stenosis in the aortoiliac segment.    Ultimately the bifurcation was crossed and right leg arteriogram was performed given the patient was very uncooperative during the procedure.  Patient had a patent common femoral and profunda with a proximal SFA chronic total occlusion with a short stump.  The remainder of the SFA was patent and diffusely diseased but no flow-limiting stenosis.  The popliteal artery was patent and two-vessel runoff through the peroneal and posterior tibial artery.  Ultimately the right SFA proximal occlusion was crossed and balloon angioplasty was performed of the proximal SFA with a 5 mm x 60 mm Jade balloon.  She now has inline flow down the right lower extremity.  Patient was very uncooperative throughout the case and moving and made intervention difficult.   Procedure:  The patient was identified in the holding area and taken to room 8.  The patient  was then placed supine on the table and prepped and draped in the usual sterile fashion.  A time out was called.  Ultrasound was used to evaluate the left common femoral artery.  It was patent .  A digital ultrasound image was acquired.  A micropuncture needle was used to access the left common femoral artery under ultrasound guidance.  An 018 wire was advanced without resistance and a micropuncture sheath was placed.  The 018 wire was removed and a benson wire was placed.  The micropuncture sheath was exchanged for a 5 french sheath.  An omniflush catheter was advanced over the wire to the level of L-1.  An abdominal angiogram was obtained.  Next, using the omniflush catheter and a benson wire, the aortic bifurcation was crossed and the catheter was placed into theright external iliac artery and right runoff was obtained.  Please note we had to crossover because patient was so uncooperative and would not sit still during the study even with moderate conscious sedation.  Ultimately in order to give her the best chance to heal her foot wound elected to try and cross her proximal SFA occlusion.  A glidewire advantage was used to exchange for a six [place] Ansell sheath in the left groin over the aortic bifurcation.  Patient was given 100 units per kilogram heparin.  Then used a Glidewire advantage with short 035 quick cross and crossed the proximal SFA occlusion on the right and back into the true lumen confirmed hand-injection.  The proximal SFA was then balloon angioplastied with a 5 mm x 60 mm Jade balloon to nominal pressure for 2 minutes.  She has now inline flow down  the right lower extremity with preserved runoff through the peroneal and posterior tibial.  I did not elect any more complex intervention given patient was extremely uncooperative moving constantly and screaming throughout the case.  Plan: Will be loaded on Plavix and taking aspirin daily.  Plan right foot debridement tomorrow.  High risk for limb  loss.     Marty Heck, MD [facility] Office: [phone]

## 2020-04-03 NOTE — Progress Notes (Signed)
Site area: left groin fa sheath pulled by Caren Griffins Site Prior to Removal:  Level 0 Pressure Applied For: 20 minutes Manual:   yes Patient Status During Pull:  stable Post Pull Site:  Level 0 Post Pull Instructions Given: yes  Post Pull Pulses Present: left BKA; rt PT and DP dopplered Dressing Applied:  Gauze and tegaderm Bedrest begins @ 1440 Comments: IV saline locked

## 2020-04-04 ENCOUNTER — Inpatient Hospital Stay (HOSPITAL_COMMUNITY): Payer: Medicare Other | Admitting: Anesthesiology

## 2020-04-04 ENCOUNTER — Encounter (HOSPITAL_COMMUNITY): Payer: Self-pay | Admitting: Vascular Surgery

## 2020-04-04 ENCOUNTER — Other Ambulatory Visit: Payer: Self-pay

## 2020-04-04 ENCOUNTER — Encounter (HOSPITAL_COMMUNITY)
Admission: RE | Disposition: A | Discharge status: Skilled Nursing Facility | Payer: Self-pay | Source: Home / Self Care | Attending: Vascular Surgery

## 2020-04-04 DIAGNOSIS — T189XXA Foreign body of alimentary tract, part unspecified, initial encounter: Secondary | ICD-10-CM | POA: Diagnosis not present

## 2020-04-04 DIAGNOSIS — Z833 Family history of diabetes mellitus: Secondary | ICD-10-CM | POA: Diagnosis not present

## 2020-04-04 DIAGNOSIS — E1122 Type 2 diabetes mellitus with diabetic chronic kidney disease: Secondary | ICD-10-CM | POA: Diagnosis present

## 2020-04-04 DIAGNOSIS — R5381 Other malaise: Secondary | ICD-10-CM | POA: Diagnosis not present

## 2020-04-04 DIAGNOSIS — E875 Hyperkalemia: Secondary | ICD-10-CM | POA: Diagnosis not present

## 2020-04-04 DIAGNOSIS — I96 Gangrene, not elsewhere classified: Secondary | ICD-10-CM | POA: Diagnosis not present

## 2020-04-04 DIAGNOSIS — L97414 Non-pressure chronic ulcer of right heel and midfoot with necrosis of bone: Secondary | ICD-10-CM | POA: Diagnosis not present

## 2020-04-04 DIAGNOSIS — E1151 Type 2 diabetes mellitus with diabetic peripheral angiopathy without gangrene: Secondary | ICD-10-CM | POA: Diagnosis not present

## 2020-04-04 DIAGNOSIS — L97413 Non-pressure chronic ulcer of right heel and midfoot with necrosis of muscle: Secondary | ICD-10-CM | POA: Diagnosis not present

## 2020-04-04 DIAGNOSIS — Z743 Need for continuous supervision: Secondary | ICD-10-CM | POA: Diagnosis not present

## 2020-04-04 DIAGNOSIS — R2689 Other abnormalities of gait and mobility: Secondary | ICD-10-CM | POA: Diagnosis present

## 2020-04-04 DIAGNOSIS — N250 Renal osteodystrophy: Secondary | ICD-10-CM | POA: Diagnosis not present

## 2020-04-04 DIAGNOSIS — Z79899 Other long term (current) drug therapy: Secondary | ICD-10-CM | POA: Diagnosis not present

## 2020-04-04 DIAGNOSIS — L97419 Non-pressure chronic ulcer of right heel and midfoot with unspecified severity: Secondary | ICD-10-CM | POA: Diagnosis present

## 2020-04-04 DIAGNOSIS — R4182 Altered mental status, unspecified: Secondary | ICD-10-CM | POA: Diagnosis not present

## 2020-04-04 DIAGNOSIS — D62 Acute posthemorrhagic anemia: Secondary | ICD-10-CM | POA: Diagnosis present

## 2020-04-04 DIAGNOSIS — R279 Unspecified lack of coordination: Secondary | ICD-10-CM | POA: Diagnosis not present

## 2020-04-04 DIAGNOSIS — X838XXA Intentional self-harm by other specified means, initial encounter: Secondary | ICD-10-CM | POA: Diagnosis not present

## 2020-04-04 DIAGNOSIS — E8889 Other specified metabolic disorders: Secondary | ICD-10-CM | POA: Diagnosis present

## 2020-04-04 DIAGNOSIS — D631 Anemia in chronic kidney disease: Secondary | ICD-10-CM | POA: Diagnosis present

## 2020-04-04 DIAGNOSIS — I69391 Dysphagia following cerebral infarction: Secondary | ICD-10-CM | POA: Diagnosis not present

## 2020-04-04 DIAGNOSIS — I132 Hypertensive heart and chronic kidney disease with heart failure and with stage 5 chronic kidney disease, or end stage renal disease: Secondary | ICD-10-CM | POA: Diagnosis not present

## 2020-04-04 DIAGNOSIS — Z89511 Acquired absence of right leg below knee: Secondary | ICD-10-CM | POA: Diagnosis not present

## 2020-04-04 DIAGNOSIS — M6281 Muscle weakness (generalized): Secondary | ICD-10-CM | POA: Diagnosis present

## 2020-04-04 DIAGNOSIS — U071 COVID-19: Secondary | ICD-10-CM | POA: Diagnosis not present

## 2020-04-04 DIAGNOSIS — N2581 Secondary hyperparathyroidism of renal origin: Secondary | ICD-10-CM | POA: Diagnosis present

## 2020-04-04 DIAGNOSIS — I70261 Atherosclerosis of native arteries of extremities with gangrene, right leg: Secondary | ICD-10-CM | POA: Diagnosis present

## 2020-04-04 DIAGNOSIS — I5023 Acute on chronic systolic (congestive) heart failure: Secondary | ICD-10-CM | POA: Diagnosis not present

## 2020-04-04 DIAGNOSIS — I120 Hypertensive chronic kidney disease with stage 5 chronic kidney disease or end stage renal disease: Secondary | ICD-10-CM | POA: Diagnosis present

## 2020-04-04 DIAGNOSIS — I739 Peripheral vascular disease, unspecified: Secondary | ICD-10-CM | POA: Diagnosis present

## 2020-04-04 DIAGNOSIS — R1312 Dysphagia, oropharyngeal phase: Secondary | ICD-10-CM | POA: Diagnosis present

## 2020-04-04 DIAGNOSIS — E1165 Type 2 diabetes mellitus with hyperglycemia: Secondary | ICD-10-CM | POA: Diagnosis present

## 2020-04-04 DIAGNOSIS — E11621 Type 2 diabetes mellitus with foot ulcer: Secondary | ICD-10-CM | POA: Diagnosis present

## 2020-04-04 DIAGNOSIS — Z992 Dependence on renal dialysis: Secondary | ICD-10-CM | POA: Diagnosis not present

## 2020-04-04 DIAGNOSIS — R410 Disorientation, unspecified: Secondary | ICD-10-CM | POA: Diagnosis not present

## 2020-04-04 DIAGNOSIS — I70221 Atherosclerosis of native arteries of extremities with rest pain, right leg: Secondary | ICD-10-CM | POA: Diagnosis present

## 2020-04-04 DIAGNOSIS — M79604 Pain in right leg: Secondary | ICD-10-CM | POA: Diagnosis not present

## 2020-04-04 DIAGNOSIS — E669 Obesity, unspecified: Secondary | ICD-10-CM | POA: Diagnosis present

## 2020-04-04 DIAGNOSIS — Z7189 Other specified counseling: Secondary | ICD-10-CM | POA: Diagnosis not present

## 2020-04-04 DIAGNOSIS — N186 End stage renal disease: Secondary | ICD-10-CM | POA: Diagnosis present

## 2020-04-04 DIAGNOSIS — M86171 Other acute osteomyelitis, right ankle and foot: Secondary | ICD-10-CM | POA: Diagnosis not present

## 2020-04-04 DIAGNOSIS — I7092 Chronic total occlusion of artery of the extremities: Secondary | ICD-10-CM | POA: Diagnosis present

## 2020-04-04 DIAGNOSIS — Z515 Encounter for palliative care: Secondary | ICD-10-CM | POA: Diagnosis not present

## 2020-04-04 DIAGNOSIS — Z751 Person awaiting admission to adequate facility elsewhere: Secondary | ICD-10-CM | POA: Diagnosis not present

## 2020-04-04 DIAGNOSIS — E1152 Type 2 diabetes mellitus with diabetic peripheral angiopathy with gangrene: Secondary | ICD-10-CM | POA: Diagnosis present

## 2020-04-04 DIAGNOSIS — I70234 Atherosclerosis of native arteries of right leg with ulceration of heel and midfoot: Secondary | ICD-10-CM | POA: Diagnosis not present

## 2020-04-04 DIAGNOSIS — Z8249 Family history of ischemic heart disease and other diseases of the circulatory system: Secondary | ICD-10-CM | POA: Diagnosis not present

## 2020-04-04 DIAGNOSIS — R030 Elevated blood-pressure reading, without diagnosis of hypertension: Secondary | ICD-10-CM | POA: Diagnosis present

## 2020-04-04 DIAGNOSIS — R2681 Unsteadiness on feet: Secondary | ICD-10-CM | POA: Diagnosis present

## 2020-04-04 DIAGNOSIS — Z4789 Encounter for other orthopedic aftercare: Secondary | ICD-10-CM | POA: Diagnosis not present

## 2020-04-04 DIAGNOSIS — I255 Ischemic cardiomyopathy: Secondary | ICD-10-CM | POA: Diagnosis present

## 2020-04-04 DIAGNOSIS — Z20822 Contact with and (suspected) exposure to covid-19: Secondary | ICD-10-CM | POA: Diagnosis present

## 2020-04-04 DIAGNOSIS — Z9981 Dependence on supplemental oxygen: Secondary | ICD-10-CM | POA: Diagnosis not present

## 2020-04-04 DIAGNOSIS — R531 Weakness: Secondary | ICD-10-CM | POA: Diagnosis not present

## 2020-04-04 HISTORY — PX: WOUND DEBRIDEMENT: SHX247

## 2020-04-04 LAB — CREATININE, SERUM
Creatinine, Ser: 6.73 mg/dL — ABNORMAL HIGH (ref 0.44–1.00)
GFR calc Af Amer: 8 mL/min — ABNORMAL LOW (ref >60)
GFR calc non Af Amer: 7 mL/min — ABNORMAL LOW (ref >60)

## 2020-04-04 LAB — CBC
HCT: 26.8 % — ABNORMAL LOW (ref 36.0–46.0)
Hemoglobin: 8 g/dL — ABNORMAL LOW (ref 12.0–15.0)
MCH: 28.5 pg (ref 26.0–34.0)
MCHC: 29.9 g/dL — ABNORMAL LOW (ref 30.0–36.0)
MCV: 95.4 fL (ref 80.0–100.0)
Platelets: 153 10*3/uL (ref 150–400)
RBC: 2.81 MIL/uL — ABNORMAL LOW (ref 3.87–5.11)
RDW: 17.2 % — ABNORMAL HIGH (ref 11.5–15.5)
WBC: 7.4 10*3/uL (ref 4.0–10.5)
nRBC: 0 % (ref 0.0–0.2)

## 2020-04-04 LAB — SARS CORONAVIRUS 2 BY RT PCR (HOSPITAL ORDER, PERFORMED IN ~~LOC~~ HOSPITAL LAB): SARS Coronavirus 2: NEGATIVE

## 2020-04-04 LAB — GLUCOSE, CAPILLARY
Glucose-Capillary: 141 mg/dL — ABNORMAL HIGH (ref 70–99)
Glucose-Capillary: 92 mg/dL (ref 70–99)
Glucose-Capillary: 100 mg/dL — ABNORMAL HIGH (ref 70–99)
Glucose-Capillary: 117 mg/dL — ABNORMAL HIGH (ref 70–99)
Glucose-Capillary: 320 mg/dL — ABNORMAL HIGH (ref 70–99)
Glucose-Capillary: 324 mg/dL — ABNORMAL HIGH (ref 70–99)

## 2020-04-04 SURGERY — DEBRIDEMENT, WOUND
Anesthesia: Monitor Anesthesia Care | Laterality: Right

## 2020-04-04 MED ORDER — SODIUM CHLORIDE 0.9 % IV SOLN: INTRAVENOUS | Status: DC

## 2020-04-04 MED ORDER — GABAPENTIN 100 MG PO CAPS
100.0000 mg | ORAL_CAPSULE | Freq: Every day | ORAL | Status: DC
  Administered 2020-04-05 – 2020-04-15 (×3): 100 mg via ORAL
  Filled 2020-04-04 (×11): qty 1

## 2020-04-04 MED ORDER — MORPHINE SULFATE (PF) 2 MG/ML IV SOLN
2.0000 mg | INTRAVENOUS | Status: DC | PRN
  Administered 2020-04-08 – 2020-04-09 (×4): 2 mg via INTRAVENOUS
  Filled 2020-04-04 (×4): qty 1

## 2020-04-04 MED ORDER — MIDAZOLAM HCL 2 MG/2ML IJ SOLN: 1.0000 mg | Freq: Once | INTRAMUSCULAR | Status: AC

## 2020-04-04 MED ORDER — OXYCODONE HCL 5 MG PO TABS
ORAL_TABLET | ORAL | Status: AC
  Administered 2020-04-04: 5 mg
  Filled 2020-04-04: qty 1

## 2020-04-04 MED ORDER — GUAIFENESIN-DM 100-10 MG/5ML PO SYRP: 15.0000 mL | ORAL_SOLUTION | ORAL | Status: DC | PRN

## 2020-04-04 MED ORDER — ORAL CARE MOUTH RINSE: 15.0000 mL | Freq: Once | OROMUCOSAL | Status: AC

## 2020-04-04 MED ORDER — FENTANYL CITRATE (PF) 100 MCG/2ML IJ SOLN: 50.0000 ug | Freq: Once | INTRAMUSCULAR | Status: AC

## 2020-04-04 MED ORDER — DOCUSATE SODIUM 100 MG PO CAPS
100.0000 mg | ORAL_CAPSULE | Freq: Every day | ORAL | Status: DC
  Administered 2020-04-10 – 2020-04-14 (×4): 100 mg via ORAL
  Filled 2020-04-04 (×9): qty 1

## 2020-04-04 MED ORDER — CHLORHEXIDINE GLUCONATE 0.12 % MT SOLN
OROMUCOSAL | Status: AC
  Filled 2020-04-04: qty 15

## 2020-04-04 MED ORDER — MIDAZOLAM HCL 2 MG/2ML IJ SOLN
INTRAMUSCULAR | Status: AC
  Filled 2020-04-04: qty 2

## 2020-04-04 MED ORDER — CLINDAMYCIN PHOSPHATE 300 MG/50ML IV SOLN
300.0000 mg | Freq: Four times a day (QID) | INTRAVENOUS | Status: DC
  Administered 2020-04-04 – 2020-04-05 (×3): 300 mg via INTRAVENOUS
  Filled 2020-04-04 (×5): qty 50

## 2020-04-04 MED ORDER — PROPOFOL 1000 MG/100ML IV EMUL
INTRAVENOUS | Status: AC
  Filled 2020-04-04: qty 100

## 2020-04-04 MED ORDER — MIDAZOLAM HCL 2 MG/2ML IJ SOLN
INTRAMUSCULAR | Status: AC
  Administered 2020-04-04: 1 mg via INTRAVENOUS
  Filled 2020-04-04: qty 2

## 2020-04-04 MED ORDER — PROPOFOL 10 MG/ML IV BOLUS
INTRAVENOUS | Status: AC
  Filled 2020-04-04: qty 20

## 2020-04-04 MED ORDER — HEPARIN SODIUM (PORCINE) 5000 UNIT/ML IJ SOLN
5000.0000 [IU] | Freq: Three times a day (TID) | INTRAMUSCULAR | Status: DC
  Administered 2020-04-04 – 2020-04-17 (×33): 5000 [IU] via SUBCUTANEOUS
  Filled 2020-04-04 (×34): qty 1

## 2020-04-04 MED ORDER — FENTANYL CITRATE (PF) 250 MCG/5ML IJ SOLN
INTRAMUSCULAR | Status: AC
  Filled 2020-04-04: qty 5

## 2020-04-04 MED ORDER — CHLORHEXIDINE GLUCONATE 0.12 % MT SOLN: 15.0000 mL | Freq: Once | OROMUCOSAL | Status: AC

## 2020-04-04 MED ORDER — PANTOPRAZOLE SODIUM 40 MG PO TBEC
40.0000 mg | DELAYED_RELEASE_TABLET | Freq: Every day | ORAL | Status: DC
  Administered 2020-04-04 – 2020-04-17 (×13): 40 mg via ORAL
  Filled 2020-04-04 (×15): qty 1

## 2020-04-04 MED ORDER — ONDANSETRON HCL 4 MG/2ML IJ SOLN: 4.0000 mg | Freq: Once | INTRAMUSCULAR | Status: DC | PRN

## 2020-04-04 MED ORDER — INSULIN ASPART 100 UNIT/ML ~~LOC~~ SOLN
0.0000 [IU] | Freq: Every day | SUBCUTANEOUS | Status: DC
  Administered 2020-04-04: 4 [IU] via SUBCUTANEOUS

## 2020-04-04 MED ORDER — POTASSIUM CHLORIDE CRYS ER 20 MEQ PO TBCR: 20.0000 meq | EXTENDED_RELEASE_TABLET | Freq: Every day | ORAL | Status: DC | PRN

## 2020-04-04 MED ORDER — ACETAMINOPHEN 500 MG PO TABS
ORAL_TABLET | ORAL | Status: AC
  Filled 2020-04-04: qty 2

## 2020-04-04 MED ORDER — ALUM & MAG HYDROXIDE-SIMETH 200-200-20 MG/5ML PO SUSP: 15.0000 mL | ORAL | Status: DC | PRN

## 2020-04-04 MED ORDER — METOPROLOL TARTRATE 5 MG/5ML IV SOLN: 2.0000 mg | INTRAVENOUS | Status: DC | PRN

## 2020-04-04 MED ORDER — OXYCODONE HCL 5 MG PO TABS: 5.0000 mg | ORAL_TABLET | ORAL | Status: DC | PRN

## 2020-04-04 MED ORDER — ACETAMINOPHEN 500 MG PO TABS: 1000.0000 mg | ORAL_TABLET | Freq: Once | ORAL | Status: DC

## 2020-04-04 MED ORDER — PROPOFOL 500 MG/50ML IV EMUL
INTRAVENOUS | Status: DC | PRN
  Administered 2020-04-04: 100 ug/kg/min via INTRAVENOUS

## 2020-04-04 MED ORDER — MAGNESIUM SULFATE 2 GM/50ML IV SOLN: 2.0000 g | Freq: Every day | INTRAVENOUS | Status: DC | PRN

## 2020-04-04 MED ORDER — ACETAMINOPHEN 650 MG RE SUPP: 325.0000 mg | RECTAL | Status: DC | PRN

## 2020-04-04 MED ORDER — 0.9 % SODIUM CHLORIDE (POUR BTL) OPTIME
TOPICAL | Status: DC | PRN
  Administered 2020-04-04: 2000 mL

## 2020-04-04 MED ORDER — DIPHENHYDRAMINE HCL 25 MG PO CAPS
25.0000 mg | ORAL_CAPSULE | Freq: Four times a day (QID) | ORAL | Status: DC | PRN
  Administered 2020-04-04 – 2020-04-08 (×2): 25 mg via ORAL
  Filled 2020-04-04 (×4): qty 1

## 2020-04-04 MED ORDER — SEVELAMER CARBONATE 800 MG PO TABS
2400.0000 mg | ORAL_TABLET | Freq: Three times a day (TID) | ORAL | Status: DC
  Administered 2020-04-04 – 2020-04-17 (×22): 2400 mg via ORAL
  Filled 2020-04-04 (×25): qty 3

## 2020-04-04 MED ORDER — FENTANYL CITRATE (PF) 100 MCG/2ML IJ SOLN: 25.0000 ug | INTRAMUSCULAR | Status: DC | PRN

## 2020-04-04 MED ORDER — HEPARIN SODIUM (PORCINE) 1000 UNIT/ML IJ SOLN
INTRAMUSCULAR | Status: AC
  Filled 2020-04-04: qty 2

## 2020-04-04 MED ORDER — PHENOL 1.4 % MT LIQD: 1.0000 | OROMUCOSAL | Status: DC | PRN

## 2020-04-04 MED ORDER — CHLORHEXIDINE GLUCONATE 0.12 % MT SOLN
OROMUCOSAL | Status: AC
  Administered 2020-04-04: 15 mL via OROMUCOSAL
  Filled 2020-04-04: qty 15

## 2020-04-04 MED ORDER — SENNOSIDES-DOCUSATE SODIUM 8.6-50 MG PO TABS: 1.0000 | ORAL_TABLET | Freq: Every evening | ORAL | Status: DC | PRN

## 2020-04-04 MED ORDER — INSULIN ASPART 100 UNIT/ML ~~LOC~~ SOLN: 0.0000 [IU] | Freq: Three times a day (TID) | SUBCUTANEOUS | Status: DC

## 2020-04-04 MED ORDER — DEXMEDETOMIDINE HCL 200 MCG/2ML IV SOLN
INTRAVENOUS | Status: DC | PRN
  Administered 2020-04-04: 8 ug via INTRAVENOUS
  Administered 2020-04-04: 12 ug via INTRAVENOUS

## 2020-04-04 MED ORDER — FENTANYL CITRATE (PF) 100 MCG/2ML IJ SOLN
INTRAMUSCULAR | Status: AC
  Administered 2020-04-04: 50 ug via INTRAVENOUS
  Filled 2020-04-04: qty 2

## 2020-04-04 MED ORDER — ACETAMINOPHEN 325 MG PO TABS
325.0000 mg | ORAL_TABLET | ORAL | Status: DC | PRN
  Administered 2020-04-07: 650 mg via ORAL
  Administered 2020-04-07: 325 mg via ORAL
  Filled 2020-04-04 (×2): qty 2

## 2020-04-04 MED FILL — Lidocaine HCl Local Preservative Free (PF) Inj 1%: INTRAMUSCULAR | Qty: 30 | Status: AC

## 2020-04-04 SURGICAL SUPPLY — 35 items
BNDG ELASTIC 4X5.8 VLCR STR LF (GAUZE/BANDAGES/DRESSINGS) ×3 IMPLANT
BNDG ELASTIC 6X5.8 VLCR STR LF (GAUZE/BANDAGES/DRESSINGS) IMPLANT
BNDG GAUZE ELAST 4 BULKY (GAUZE/BANDAGES/DRESSINGS) ×3 IMPLANT
CANISTER SUCT 3000ML PPV (MISCELLANEOUS) ×3 IMPLANT
COVER SURGICAL LIGHT HANDLE (MISCELLANEOUS) ×3 IMPLANT
COVER WAND RF STERILE (DRAPES) ×3 IMPLANT
DRAPE EXTREMITY T 121X128X90 (DISPOSABLE) IMPLANT
DRAPE HALF SHEET 40X57 (DRAPES) IMPLANT
DRAPE INCISE IOBAN 66X45 STRL (DRAPES) IMPLANT
DRAPE ORTHO SPLIT 77X108 STRL (DRAPES)
DRAPE SURG ORHT 6 SPLT 77X108 (DRAPES) IMPLANT
DRSG ADAPTIC 3X8 NADH LF (GAUZE/BANDAGES/DRESSINGS) IMPLANT
DRSG PAD ABDOMINAL 8X10 ST (GAUZE/BANDAGES/DRESSINGS) ×3 IMPLANT
ELECT REM PT RETURN 9FT ADLT (ELECTROSURGICAL) ×3
ELECTRODE REM PT RTRN 9FT ADLT (ELECTROSURGICAL) ×1 IMPLANT
GAUZE SPONGE 4X4 12PLY STRL LF (GAUZE/BANDAGES/DRESSINGS) ×3 IMPLANT
GLOVE BIO SURGEON STRL SZ7.5 (GLOVE) ×3 IMPLANT
GOWN STRL REUS W/ TWL LRG LVL3 (GOWN DISPOSABLE) ×2 IMPLANT
GOWN STRL REUS W/ TWL XL LVL3 (GOWN DISPOSABLE) ×1 IMPLANT
GOWN STRL REUS W/TWL LRG LVL3 (GOWN DISPOSABLE) ×4
GOWN STRL REUS W/TWL XL LVL3 (GOWN DISPOSABLE) ×2
KIT BASIN OR (CUSTOM PROCEDURE TRAY) ×3 IMPLANT
KIT TURNOVER KIT B (KITS) ×3 IMPLANT
NS IRRIG 1000ML POUR BTL (IV SOLUTION) ×3 IMPLANT
PACK GENERAL/GYN (CUSTOM PROCEDURE TRAY) IMPLANT
PAD ARMBOARD 7.5X6 YLW CONV (MISCELLANEOUS) ×6 IMPLANT
SUT ETHILON 3 0 PS 1 (SUTURE) IMPLANT
SUT VIC AB 2-0 CT1 27 (SUTURE)
SUT VIC AB 2-0 CT1 TAPERPNT 27 (SUTURE) IMPLANT
SUT VIC AB 3-0 SH 27 (SUTURE)
SUT VIC AB 3-0 SH 27X BRD (SUTURE) IMPLANT
SUT VICRYL 4-0 PS2 18IN ABS (SUTURE) IMPLANT
TOWEL GREEN STERILE (TOWEL DISPOSABLE) ×6 IMPLANT
TOWEL GREEN STERILE FF (TOWEL DISPOSABLE) ×3 IMPLANT
WATER STERILE IRR 1000ML POUR (IV SOLUTION) ×3 IMPLANT

## 2020-04-04 NOTE — Transfer of Care (Signed)
Immediate Anesthesia Transfer of Care Note  Patient: Karen [name]  Procedure(s) Performed: RIGHT FOOT DEBRIDEMENT (Right )  Patient Location: PACU  Anesthesia Type:MAC and Regional  Level of Consciousness: responds to stimulation  Airway & Oxygen Therapy: Patient Spontanous Breathing and Patient connected to face mask oxygen  Post-op Assessment: Report given to RN and Post -op Vital signs reviewed and stable  Post vital signs: Reviewed and stable  Last Vitals:  Vitals Value Taken Time  BP 168/90 04/04/20 1254  Temp    Pulse 91 04/04/20 1257  Resp 19 04/04/20 1257  SpO2 93 % 04/04/20 1257  Vitals shown include unvalidated device data.  Last Pain:  Vitals:   04/04/20 1254  TempSrc:   PainSc: (P) Asleep      Patients Stated Pain Goal: 7 (31/59/45 8592)  Complications: No complications documented.

## 2020-04-04 NOTE — Progress Notes (Signed)
[facility]  Subjective  - she can smell her right foot   Objective 140/63 (!) 102 98.4 F (36.9 C) (Axillary) 18 96%  Intake/Output Summary (Last 24 hours) at 04/04/2020 0821 Last data filed at 04/04/2020 0455 Gross per 24 hour  Intake --  Output 1518 ml  Net -1518 ml    Left groin c/d/i Right DP/PT brisk by doppler  Laboratory Lab Results: Recent Labs    04/03/20 0911 04/03/20 2127  WBC  --  9.2  HGB 8.5* 7.6*  HCT 25.0* 25.6*  PLT  --  153   BMET Recent Labs    04/03/20 0911 04/03/20 2127  NA 137 136  K 5.1 4.7  CL 99 97*  CO2  --  20*  GLUCOSE 194* 189*  BUN 63* 53*  CREATININE 13.20* 12.23*  CALCIUM  --  8.6*    COAG Lab Results  Component Value Date   INR 1.15 02/08/2013   No results found for: PTT  Assessment/Planning: POD #1 status post right SFA angioplasty for tissue loss and large necrotic right heel wound.  She has brisk Doppler signals.  Continue aspirin Plavix.  Plan for debridement right foot today.  I think she is at very high risk for limb loss given the severity of her wound.  Marty Heck 04/04/2020 8:21 AM --

## 2020-04-04 NOTE — Anesthesia Postprocedure Evaluation (Signed)
Anesthesia Post Note  Patient: Karen [name]  Procedure(s) Performed: RIGHT FOOT DEBRIDEMENT (Right )     Patient location during evaluation: PACU Anesthesia Type: Regional and MAC Level of consciousness: awake and alert Pain management: pain level controlled Vital Signs Assessment: post-procedure vital signs reviewed and stable Respiratory status: spontaneous breathing, nonlabored ventilation and respiratory function stable Cardiovascular status: blood pressure returned to baseline and stable Postop Assessment: no apparent nausea or vomiting Anesthetic complications: no   No complications documented.  Last Vitals:  Vitals:   04/04/20 1337 04/04/20 1348  BP: (!) 143/84 139/79  Pulse: 85 83  Resp: 17 18  Temp:  36.8 C  SpO2: 95% 99%    Last Pain:  Vitals:   04/04/20 1348  TempSrc: Oral  PainSc:                  Pervis Hocking

## 2020-04-04 NOTE — Progress Notes (Signed)
[name] Progress Note   Subjective:     Objective Vitals:   04/04/20 1309 04/04/20 1324 04/04/20 1337 04/04/20 1348  BP: 130/81 (!) 145/84 (!) 143/84 139/79  Pulse: 87 83 85 83  Resp: 19 20 17 18   Temp:  98.3 F (36.8 C)  98.3 F (36.8 C)  TempSrc:    Oral  SpO2: 95% 96% 95% 99%  Weight:      Height:       Physical Exam General: Chronically ill  Appearing obese female Heart: S1,S2 RRR Lungs: CTAB Abdomen: S, NT Extremities: L BKA No stump edema. ACE wrap R foot Dialysis Access: LUA AVG +Bruit  Additional Objective Labs: Basic Metabolic Panel: Recent Labs  Lab 04/03/20 0911 04/03/20 2127 04/04/20 1458  NA 137 136  --   K 5.1 4.7  --   CL 99 97*  --   CO2  --  20*  --   GLUCOSE 194* 189*  --   BUN 63* 53*  --   CREATININE 13.20* 12.23* 6.73*  CALCIUM  --  8.6*  --   PHOS  --  9.5*  --    Liver Function Tests: Recent Labs  Lab 04/03/20 2127  ALBUMIN 2.8*   No results for input(s): LIPASE, AMYLASE in the last 168 hours. CBC: Recent Labs  Lab 04/03/20 0911 04/03/20 2127 04/04/20 1458  WBC  --  9.2 7.4  HGB 8.5* 7.6* 8.0*  HCT 25.0* 25.6* 26.8*  MCV  --  94.5 95.4  PLT  --  153 153   Blood Culture    Component Value Date/Time   SDES BLOOD RIGHT ANTECUBITAL 09/07/2017 1830   SPECREQUEST IN PEDIATRIC BOTTLE Blood Culture adequate volume 09/07/2017 1830   CULT NO GROWTH 5 DAYS 09/07/2017 1830   REPTSTATUS 09/12/2017 FINAL 09/07/2017 1830    Cardiac Enzymes: No results for input(s): CKTOTAL, CKMB, CKMBINDEX, TROPONINI in the last 168 hours. CBG: Recent Labs  Lab 04/03/20 2234 04/04/20 0601 04/04/20 1209 04/04/20 1256 04/04/20 1349  GLUCAP 177* 141* 92 100* 117*   Iron Studies: No results for input(s): IRON, TIBC, TRANSFERRIN, FERRITIN in the last 72 hours. @lablastinr3 @ Studies/Results: PERIPHERAL VASCULAR CATHETERIZATION  Result Date: 04/03/2020 Patient name: Karen [name] MRN: [id number]        DOB: 1976/06/09           Sex: female  04/03/2020 Pre-operative Diagnosis: Necrotic right heel wound Post-operative diagnosis:  Same Surgeon:  Marty Heck, MD Procedure Performed: 1.  Ultrasound-guided access of left common femoral artery 2.  Aortogram including catheter selection of aorta 3.  Right lower extremity arteriogram with selection of third order branches 4.  Right proximal SFA angioplasty (5 mm x 60 mm Jade angioplasty baloon) 5.  39 minutes monitored moderate conscious sedation time  Indications: Patient is a 44 year old female with end-stage renal disease, left BKA, ischemic cardiomyopathy and diabetes who presents with large necrotic right ankle wound.  I previously discussed I am not sure this leg is salvageable given the significant tissue loss and difficulty with any future soft tissue coverage.  She is adamantly against amputation and presents for arteriogram possible intervention and any attempt at limb salvage.  Findings:  Aortogram showed no flow-limiting stenosis in the aortoiliac segment.   Ultimately the bifurcation was crossed and right leg arteriogram was performed given the patient was very uncooperative during the procedure.  Patient had a patent common femoral and profunda with a proximal SFA chronic total occlusion with a short  stump.  The remainder of the SFA was patent and diffusely diseased but no flow-limiting stenosis.  The popliteal artery was patent and two-vessel runoff through the peroneal and posterior tibial artery.  Ultimately the right SFA proximal occlusion was crossed and balloon angioplasty was performed of the proximal SFA with a 5 mm x 60 mm Jade balloon.  She now has inline flow down the right lower extremity.  Patient was very uncooperative throughout the case and moving and made intervention difficult.             Procedure:  The patient was identified in the holding area and taken to room 8.  The patient was then placed supine on the table and prepped and draped in the usual  sterile fashion.  A time out was called.  Ultrasound was used to evaluate the left common femoral artery.  It was patent .  A digital ultrasound image was acquired.  A micropuncture needle was used to access the left common femoral artery under ultrasound guidance.  An 018 wire was advanced without resistance and a micropuncture sheath was placed.  The 018 wire was removed and a benson wire was placed.  The micropuncture sheath was exchanged for a 5 french sheath.  An omniflush catheter was advanced over the wire to the level of L-1.  An abdominal angiogram was obtained.  Next, using the omniflush catheter and a benson wire, the aortic bifurcation was crossed and the catheter was placed into theright external iliac artery and right runoff was obtained.  Please note we had to crossover because patient was so uncooperative and would not sit still during the study even with moderate conscious sedation.  Ultimately in order to give her the best chance to heal her foot wound elected to try and cross her proximal SFA occlusion.  A glidewire advantage was used to exchange for a six [place] Ansell sheath in the left groin over the aortic bifurcation.  Patient was given 100 units per kilogram heparin.  Then used a Glidewire advantage with short 035 quick cross and crossed the proximal SFA occlusion on the right and back into the true lumen confirmed hand-injection.  The proximal SFA was then balloon angioplastied with a 5 mm x 60 mm Jade balloon to nominal pressure for 2 minutes.  She has now inline flow down the right lower extremity with preserved runoff through the peroneal and posterior tibial.  I did not elect any more complex intervention given patient was extremely uncooperative moving constantly and screaming throughout the case.  Plan: Will be loaded on Plavix and taking aspirin daily.  Plan right foot debridement tomorrow.  High risk for limb loss.     Marty Heck, MD [facility]  [place] Office: [phone]  Medications: . sodium chloride    . sodium chloride    . sodium chloride    . clindamycin (CLEOCIN) IV    . ferric gluconate (FERRLECIT/NULECIT) IV    . magnesium sulfate bolus IVPB     . amLODipine  10 mg Oral Daily  . aspirin EC  81 mg Oral Daily  . atorvastatin  40 mg Oral Daily  . calcitRIOL  1.5 mcg Oral Q T,Th,Sa-HD  . chlorhexidine      . Chlorhexidine Gluconate Cloth  6 each Topical Q0600  . clopidogrel  300 mg Oral Once   Followed by  . clopidogrel  75 mg Oral Q breakfast  . dexamethasone  6 mg Oral Daily  . [  START ON 04/05/2020] docusate sodium  100 mg Oral Daily  . [START ON 04/05/2020] doxercalciferol  7 mcg Intravenous Q M,W,F-HD  . [START ON 04/05/2020] gabapentin  100 mg Oral QHS  . heparin injection (subcutaneous)  5,000 Units Subcutaneous Q8H  . insulin aspart  0-15 Units Subcutaneous TID WC  . insulin glargine  18 Units Subcutaneous QHS  . methocarbamol  500 mg Oral BID  . metoprolol succinate  12.5 mg Oral Daily  . multivitamin  1 tablet Oral QHS  . pantoprazole  40 mg Oral Daily  . polyethylene glycol  17 g Oral Daily  . sevelamer carbonate  800 mg Oral TID WC  . sodium chloride flush  3 mL Intravenous Q12H     Dialysis Orders: [facility] MWF 4 hrs 180NRe 400/800 99.5 kg 2.0 K/ 2.25 Ca UFP 2 L AVG -Heparin 6000 units IV TIW -Hectorol 7 mcg IV TIW -Mircera 150 mcg IV q 2 weeks (last dose 03/22/2020 Last HGB 7.7 03/27/20)  Assessment/Plan: 1.  Necrotic R Heel/Critical Limb Ischemia-per VVS. Attempting limb salvage as she is refusing amputation. Back to OR today for debridement of R heel per Dr.Cain.  2.  ESRD -  MWF-Finished HD early this AM. Next HD 04/04/2020. No heparin.  3.  Hypertension/volume  - BP well controlled. HD early this AM, Net UF 151/ post wt 102 kg. Still above OP EDW. Continue lowering as tolerated.   Amlodipine 10 mg PO q day per primary 4.  Anemia  - HGB 8.0. Recent ESA dose 03/27/2020. Follow HGB 5.   Metabolic bone disease -  PO4 9.5 C Ca  Usually noncompliant with binders. Resume binders when eating, continue VDRA.   6.  Nutrition - NPO at present 7.  DM-per primary  Jimmye Norman. Analissa Bayless NP-C 04/04/2020, 3:58 PM  [employer] [phone]

## 2020-04-04 NOTE — Progress Notes (Signed)
Patient has not had a covid test.  Nurse, Tye Maryland, on 4E made aware and states she will test patient.

## 2020-04-04 NOTE — TOC Initial Note (Signed)
Transition of Care [facility]) - Initial/Assessment Note    Patient Details  Name: Karen [name] MRN: [id number] Date of Birth: 08/11/1976  Transition of Care [facility] CM/SW Contact:    Verdell Carmine, RN Phone Number: 04/04/2020, 9:42 AM  Clinical Narrative:                  admitted with vascular issue, needs debridement of heel. Patient active with [place] [facility] . MD states she is at high risk for limb loss related to severity of her wound.   Expected Discharge Plan: [place] Barriers to Discharge: Continued Medical Work up   Patient Goals and CMS Choice        Expected Discharge Plan and Services Expected Discharge Plan: [place]   Discharge Planning Services: CM Consult                                          Prior Living Arrangements/Services                  Current home services: Homehealth aide, Home PT ([place])    Activities of Daily Living Home Assistive Devices/Equipment: Wheelchair ([place]) ADL Screening (condition at time of admission) Patient's cognitive ability adequate to safely complete daily activities?: Yes Is the patient deaf or have difficulty hearing?: No Does the patient have difficulty seeing, even when wearing glasses/contacts?: No Does the patient have difficulty concentrating, remembering, or making decisions?: No Patient able to express need for assistance with ADLs?: Yes Does the patient have difficulty dressing or bathing?: No Independently performs ADLs?: Yes (appropriate for developmental age) Does the patient have difficulty walking or climbing stairs?: Yes Weakness of Legs: Right (LEFT BKA) Weakness of Arms/Hands: None  Permission Sought/Granted                  Emotional Assessment              Admission diagnosis:  PAD (peripheral artery disease) ([place]) [I73.9] Patient Active Problem List   Diagnosis Date Noted  . Nausea and vomiting  02/28/2020  . Abrasion 02/28/2020  . Avulsion fracture of ankle 02/28/2020  . Drug or medicinal substance causing adverse effect in therapeutic use 02/28/2020  . Elevated brain natriuretic peptide (BNP) level 02/28/2020  . HLD (hyperlipidemia) 02/28/2020  . Hypertension, poor control 02/28/2020  . Hypertensive emergency 02/28/2020  . Hypoxia 02/28/2020  . Mild dehydration 02/28/2020  . Sedimentation rate elevation 02/28/2020  . Strange and inexplicable behavior 28/36/6294  . COVID-19 01/19/2020  . Dysphasia status post cerebrovascular accident 11/06/2019  . Lumbar spondylosis 11/06/2019  . Anxiety and depression 06/04/2019  . Cerebral infarction due to thrombosis of unspecified precerebral artery ([place]) 06/04/2019  . Chronic pain syndrome 06/04/2019  . Neck pain on right side 06/04/2019  . Stroke due to embolism of right middle cerebral artery ([place]) 06/02/2019  . Pulmonary edema 02/20/2019  . Excoriation (skin-picking) disorder 11/22/2018  . Type 2 diabetes mellitus with hyperglycemia ([place]) 11/14/2018  . SBO (small bowel obstruction) ([place]) 07/08/2018  . Small bowel obstruction ([place]) 07/08/2018  . Hypercalcemia 02/17/2018  . Weakness   . Acute on chronic systolic CHF (congestive heart failure) ([place])   . ESRD on dialysis ([place])   . Homelessness   . Late effect of cerebrovascular accident (CVA)   . Dyspnea 01/27/2018  .  Hypertension, uncontrolled 11/11/2017  . Fluid overload 11/11/2017  . Abdominal pain   . Sepsis due to methicillin resistant Staphylococcus aureus ([place]) 11/01/2017  . Penicillin allergy 10/15/2017  . Thrombocytopenia ([place]) 10/15/2017  . Personal history of transient ischemic attack (TIA), and cerebral infarction without residual deficits 09/15/2017  . Dysarthria 09/14/2017  . Lacunar infarct, acute ([place]) 09/14/2017  . Obesity 09/09/2017  . Chest pain 09/07/2017  . Other staphylococcus as the cause of diseases classified elsewhere 08/23/2017  . Diarrhea,  unspecified 12/29/2016  . Fluid overload, unspecified 10/03/2016  . Hyperkalemia 10/03/2016  . Hypertension with fluid overload 10/03/2016  . Anemia in other chronic diseases classified elsewhere 09/08/2016  . High anion gap metabolic acidosis 29/52/8413  . Right lower lobe pneumonia 09/08/2016  . Iron deficiency anemia, unspecified 02/19/2016  . Complication of vascular dialysis catheter 02/18/2016  . Encounter for removal of sutures 02/11/2016  . Uncontrolled type 2 diabetes mellitus with chronic kidney disease on chronic dialysis, with long-term current use of insulin ([place]) 01/31/2016  . Drug noncompliance   . End-stage renal disease on hemodialysis ([place])   . Unspecified protein-calorie malnutrition ([place]) 01/24/2016  . Secondary hyperparathyroidism of renal origin ([place]) 01/22/2016  . Acquired absence of left leg below knee ([place]) 01/16/2016  . CHF (congestive heart failure) ([place]) 01/16/2016  . Coagulation defect, unspecified ([place]) 01/16/2016  . Encounter for immunization 01/16/2016  . Family history of diabetes mellitus 01/16/2016  . Hypertensive chronic kidney disease with stage 1 through stage 4 chronic kidney disease, or unspecified chronic kidney disease 01/16/2016  . Nicotine dependence, other tobacco product, in remission 01/16/2016  . Pain, unspecified 01/16/2016  . Pruritus, unspecified 01/16/2016  . Red blood cell antibody positive 01/10/2016  . Hx of BKA ([place]) 07/03/2013  . S/P BKA (below knee amputation) unilateral ([place]) 06/30/2013  . Physical deconditioning 06/30/2013  . Cardiomyopathy, ischemic - EF 45-50% with inf WMA by 2D 02/05/13 02/06/2013  . Essential hypertension 02/05/2013  . PAD (peripheral artery disease) ([place]) 02/05/2013  . Diabetic neuropathy ([place])   . Osteomyelitis of left great toe - S/P amputation 02/06/13 04/21/2011   PCP:  Patient, No Pcp Per Pharmacy:   [facility] [place], [address] [place] [address] Phone: [phone] Fax: [phone]     Social Determinants of Health (SDOH) Interventions    Readmission Risk Interventions Readmission Risk Prevention Plan 04/04/2020  Social Work Consult for [place] Planning/Counseling Complete  Medication Review Press photographer) Complete  Some recent data might be hidden

## 2020-04-04 NOTE — Op Note (Signed)
    Patient name: Karen [name] MRN: [id number] DOB: 1976/03/21 Sex: female  04/04/2020 Pre-operative Diagnosis: Necrotic right heel ulcer Post-operative diagnosis:  Same Surgeon:  Erlene Quan C. Donzetta Matters, MD Assistant: Karoline [place], PA Procedure Performed:  Hervey Ard excisional debridement of right heel skin and soft tissue to 7 x 5 cm  Indications: 44 year old female underwent SFA intervention the day prior to this procedure.  She has necrotic heel wound is now indicated for debridement.  Findings: There was superficial necrosis throughout most of the heel 1 area likely tracks down to the calcaneus.  There was good capillary bleeding throughout the wound bed.  There was no purulence identified.  An assistant was necessary for elevating the leg and to assist with debridement and dressing placement.   Procedure:  The patient was identified in the holding area and taken to the operating room where she was placed supine on the operating table.  Preoperative block had been placed.  Timeout was called antibiotics were up-to-date.  The block was tested and was noted to be adequate.  We began with 10 blade excision of the eschar that was in place.  We dissected this back sharply to the soft tissue.  We did have good capillary bleeding controlled with cautery and pressure.  There was no purulence.  We thoroughly irrigated the wound.  We placed a wet-to-dry Kerlix and wrapped it tightly with ABD pad and Ace wrap.  She was awake from anesthesia having tolerated procedure without any complication.  Counts were correct at completion.  EBL: 20 cc   Julie-Anne Torain C. Donzetta Matters, MD [facility] [place] Office: [phone] Pager: [phone]

## 2020-04-04 NOTE — Anesthesia Preprocedure Evaluation (Addendum)
Anesthesia Evaluation  Patient identified by MRN, date of birth, ID band Patient awake    Reviewed: Allergy & Precautions, NPO status , Patient's Chart, lab work & pertinent test results, reviewed documented beta blocker date and time   Airway Mallampati: II  TM Distance: >3 FB Neck ROM: Full    Dental  (+) Poor Dentition, Dental Advisory Given,  Extremely poor dentition, denies any loose teeth:   Pulmonary shortness of breath and with exertion, former smoker,  Quit smoking 2006, 30 pack year history    Pulmonary exam normal breath sounds clear to auscultation       Cardiovascular hypertension, Pt. on medications and Pt. on home beta blockers + Peripheral Vascular Disease and +CHF (LVEF 78%, grade 3 diastolic dysfunction)  Normal cardiovascular exam+ Valvular Problems/Murmurs (mild MR) MR  Rhythm:Regular Rate:Normal  plavix last dose- this morning  Echo 2018: - Left ventricle: The cavity size was normal. Systolic function was  moderately reduced. The estimated ejection fraction was in the  range of 35% to 40%. Diffuse hypokinesis. Doppler parameters are  consistent with a reversible restrictive pattern, indicative of  decreased left ventricular diastolic compliance and/or increased  left atrial pressure (grade 3 diastolic dysfunction).  - Mitral valve: Moderately calcified annulus. Mildly thickened  leaflets . There was mild regurgitation.  - Left atrium: The atrium was mildly dilated. Volume/bsa, ES,  (1-plane Simpson&'s, A2C): 32.9 ml/m^2.  - Pulmonary arteries: Systolic pressure was mildly increased. PA  peak pressure: 32 mm Hg (S).    Neuro/Psych  Headaches, PSYCHIATRIC DISORDERS Anxiety Depression    GI/Hepatic Neg liver ROS, GERD  Medicated and Controlled,  Endo/Other  diabetes, Poorly Controlled, Type 2, Insulin DependentObesity BMI 36  Renal/GU ESRFRenal diseaseHD T/Th/Sat  negative genitourinary    Musculoskeletal  (+) Arthritis , Osteoarthritis,  Nonhealing wound right heel S/p L BKA   Abdominal (+) + obese,   Peds  Hematology negative hematology ROS (+)   Anesthesia Other Findings POD #1 status post right SFA angioplasty for tissue loss and large necrotic right heel wound.  Reproductive/Obstetrics negative OB ROS                            Anesthesia Physical Anesthesia Plan  ASA: IV  Anesthesia Plan: MAC and Regional   Post-op Pain Management:  Regional for Post-op pain   Induction: Intravenous  PONV Risk Score and Plan: 2 and Propofol infusion, TIVA and Treatment may vary due to age or medical condition  Airway Management Planned: Natural Airway and Nasal Cannula  Additional Equipment: None  Intra-op Plan:   Post-operative Plan:   Informed Consent: I have reviewed the patients History and Physical, chart, labs and discussed the procedure including the risks, benefits and alternatives for the proposed anesthesia with the patient or authorized representative who has indicated his/her understanding and acceptance.     Dental advisory given  Plan Discussed with: CRNA  Anesthesia Plan Comments:        Anesthesia Quick Evaluation

## 2020-04-04 NOTE — Progress Notes (Signed)
   History and Physical Update  The patient was interviewed and re-examined.  The patient's previous History and Physical has been reviewed and is unchanged from this morning evaluation by Dr. Carlis Abbott. I agree that she is at high risk for limb loss but at this time unwilling to proceed. Will plan debridement of right heel.   Sharlee Rufino C. Donzetta Matters, MD [facility] [place] Office: [phone] Pager: [phone]  04/04/2020, 11:50 AM

## 2020-04-05 ENCOUNTER — Encounter (HOSPITAL_COMMUNITY): Payer: Self-pay | Admitting: Vascular Surgery

## 2020-04-05 LAB — BASIC METABOLIC PANEL
Anion gap: 15 (ref 5–15)
BUN: 33 mg/dL — ABNORMAL HIGH (ref 6–20)
CO2: 23 mmol/L (ref 22–32)
Calcium: 8.8 mg/dL — ABNORMAL LOW (ref 8.9–10.3)
Chloride: 95 mmol/L — ABNORMAL LOW (ref 98–111)
Creatinine, Ser: 7.4 mg/dL — ABNORMAL HIGH (ref 0.44–1.00)
GFR calc Af Amer: 7 mL/min — ABNORMAL LOW (ref >60)
GFR calc non Af Amer: 6 mL/min — ABNORMAL LOW (ref >60)
Glucose, Bld: 300 mg/dL — ABNORMAL HIGH (ref 70–99)
Potassium: 4.8 mmol/L (ref 3.5–5.1)
Sodium: 133 mmol/L — ABNORMAL LOW (ref 135–145)

## 2020-04-05 LAB — GLUCOSE, CAPILLARY
Glucose-Capillary: 301 mg/dL — ABNORMAL HIGH (ref 70–99)
Glucose-Capillary: 116 mg/dL — ABNORMAL HIGH (ref 70–99)
Glucose-Capillary: 168 mg/dL — ABNORMAL HIGH (ref 70–99)
Glucose-Capillary: 178 mg/dL — ABNORMAL HIGH (ref 70–99)

## 2020-04-05 LAB — CBC
HCT: 26.2 % — ABNORMAL LOW (ref 36.0–46.0)
Hemoglobin: 7.8 g/dL — ABNORMAL LOW (ref 12.0–15.0)
MCH: 28.3 pg (ref 26.0–34.0)
MCHC: 29.8 g/dL — ABNORMAL LOW (ref 30.0–36.0)
MCV: 94.9 fL (ref 80.0–100.0)
Platelets: 141 10*3/uL — ABNORMAL LOW (ref 150–400)
RBC: 2.76 MIL/uL — ABNORMAL LOW (ref 3.87–5.11)
RDW: 16.9 % — ABNORMAL HIGH (ref 11.5–15.5)
WBC: 8 10*3/uL (ref 4.0–10.5)
nRBC: 0 % (ref 0.0–0.2)

## 2020-04-05 MED ORDER — DOXERCALCIFEROL 4 MCG/2ML IV SOLN
INTRAVENOUS | Status: AC
  Filled 2020-04-05: qty 4

## 2020-04-05 MED ORDER — CLINDAMYCIN PHOSPHATE 600 MG/50ML IV SOLN
600.0000 mg | Freq: Three times a day (TID) | INTRAVENOUS | Status: DC
  Administered 2020-04-05 – 2020-04-08 (×9): 600 mg via INTRAVENOUS
  Filled 2020-04-05 (×10): qty 50

## 2020-04-05 NOTE — Progress Notes (Signed)
OT Cancellation Note  Patient Details Name: Karen [name] MRN: [id number] DOB: [birth year]   Cancelled Treatment:    Reason Eval/Treat Not Completed: Patient at procedure or test/ unavailable (HD). Will assess at later time.   Ramond Dial, OT/L   Acute OT Clinical Specialist Acute Rehabilitation Services Pager [phone] Office [phone]  04/05/2020, 9:16 AM

## 2020-04-05 NOTE — Progress Notes (Addendum)
  Progress Note    04/05/2020 9:40 AM 1 Day Post-Op  Subjective:  Doesn't want to go to facility as she has a son at home.   Afebrile VSS 96% RA  Vitals:   04/05/20 0846 04/05/20 0851  BP: (!) 154/60 (!) 151/58  Pulse: 94 92  Resp: (!) 21 (!) 25  Temp: 97.6 F (36.4 C)   SpO2: 96%     Physical Exam: General:  No distress on HD Lungs:  Non labored Incisions:      CBC    Component Value Date/Time   WBC 8.0 04/05/2020 0408   RBC 2.76 (L) 04/05/2020 0408   HGB 7.8 (L) 04/05/2020 0408   HCT 26.2 (L) 04/05/2020 0408   PLT 141 (L) 04/05/2020 0408   MCV 94.9 04/05/2020 0408   MCH 28.3 04/05/2020 0408   MCHC 29.8 (L) 04/05/2020 0408   RDW 16.9 (H) 04/05/2020 0408   LYMPHSABS 1.5 04/02/2019 2243   MONOABS 0.6 04/02/2019 2243   EOSABS 0.2 04/02/2019 2243   BASOSABS 0.0 04/02/2019 2243    BMET    Component Value Date/Time   NA 133 (L) 04/05/2020 0408   K 4.8 04/05/2020 0408   CL 95 (L) 04/05/2020 0408   CO2 23 04/05/2020 0408   GLUCOSE 300 (H) 04/05/2020 0408   BUN 33 (H) 04/05/2020 0408   CREATININE 7.40 (H) 04/05/2020 0408   CALCIUM 8.8 (L) 04/05/2020 0408   GFRNONAA 6 (L) 04/05/2020 0408   GFRAA 7 (L) 04/05/2020 0408    INR    Component Value Date/Time   INR 1.15 02/08/2013 0500     Intake/Output Summary (Last 24 hours) at 04/05/2020 0940 Last data filed at 04/05/2020 0508 Gross per 24 hour  Intake 1310 ml  Output 30 ml  Net 1280 ml     Assessment:  44 y.o. female is s/p:  Sharp excisional debridement of right heel skin and soft tissue to 7 x 5 cm  1 Day Post-Op  Plan: -pt seen in HD and dressing changed.  Here is bloody ooze from wound.  Will continue wound care throughout the weekend.  If wound does not heal, she is at high risk for amputation. -pt will need TOC to determine disposition in this difficult situation as we are unsure she will be able to do dressing changes at home.  -hgb stable -float heel off bed to give this best chance to  heal   Leontine Locket, PA-C [facility] [phone] 04/05/2020 9:40 AM  I have seen and evaluated the patient. I agree with the PA note as documented above.  Status post revascularization to right lower extremity with SFA angioplasty.  Her necrotic heel wound was debrided yesterday.  Dressing changed today with wet-to-dry dressings daily recommended and overall looks pretty good.  I think she is at extensively high risk for limb loss as previously discussed and will be difficult to get this to heal with limited soft tissue coverage.  We will need to arrange disposition through the weekend given she lives at home with a young child and would be unable to care for the wound.  Discussed options of home health versus SNF.  Marty Heck, MD [facility] [place] Office: [phone]

## 2020-04-05 NOTE — Progress Notes (Signed)
[facility] Progress Note   Subjective:  R heel debridement in OR yesterday.  Seen in HD unit. Endorses some SOB, chronic for her, UF goal increased.   Objective Vitals:   04/04/20 1944 04/04/20 2314 04/05/20 0354 04/05/20 0737  BP: (!) 143/71 96/73 (!) 168/64 (!) 163/88  Pulse: 96   93  Resp: 20 16 16 19   Temp: 98.5 F (36.9 C) 98.7 F (37.1 C) 98.2 F (36.8 C) 98.4 F (36.9 C)  TempSrc: Oral Oral Oral Oral  SpO2: 94% 93% 95% 98%  Weight:      Height:       Physical Exam General: Chronically ill  Appearing obese female Heart: S1,S2 RRR Lungs: CTAB Abdomen: S, NT Extremities: L BKA No stump edema. ACE wrap R foot, some bleeding from wound  Dialysis Access: LUA AVG +Bruit  Additional Objective Labs: Basic Metabolic Panel: Recent Labs  Lab 04/03/20 0911 04/03/20 0911 04/03/20 2127 04/04/20 1458 04/05/20 0408  NA 137  --  136  --  133*  K 5.1  --  4.7  --  4.8  CL 99  --  97*  --  95*  CO2  --   --  20*  --  23  GLUCOSE 194*  --  189*  --  300*  BUN 63*  --  53*  --  33*  CREATININE 13.20*   < > 12.23* 6.73* 7.40*  CALCIUM  --   --  8.6*  --  8.8*  PHOS  --   --  9.5*  --   --    < > = values in this interval not displayed.   Liver Function Tests: Recent Labs  Lab 04/03/20 2127  ALBUMIN 2.8*   No results for input(s): LIPASE, AMYLASE in the last 168 hours. CBC: Recent Labs  Lab 04/03/20 2127 04/04/20 1458 04/05/20 0408  WBC 9.2 7.4 8.0  HGB 7.6* 8.0* 7.8*  HCT 25.6* 26.8* 26.2*  MCV 94.5 95.4 94.9  PLT 153 153 141*   Blood Culture    Component Value Date/Time   SDES BLOOD RIGHT ANTECUBITAL 09/07/2017 1830   SPECREQUEST IN PEDIATRIC BOTTLE Blood Culture adequate volume 09/07/2017 1830   CULT NO GROWTH 5 DAYS 09/07/2017 1830   REPTSTATUS 09/12/2017 FINAL 09/07/2017 1830    Cardiac Enzymes: No results for input(s): CKTOTAL, CKMB, CKMBINDEX, TROPONINI in the last 168 hours. CBG: Recent Labs  Lab 04/04/20 1256 04/04/20 1349  04/04/20 1619 04/04/20 2107 04/05/20 0614  GLUCAP 100* 117* 320* 324* 301*   Iron Studies: No results for input(s): IRON, TIBC, TRANSFERRIN, FERRITIN in the last 72 hours. @lablastinr3 @ Studies/Results: PERIPHERAL VASCULAR CATHETERIZATION  Result Date: 04/03/2020 Patient name: Karen [name] MRN: [id number]        DOB: 1976/05/10          Sex: female  04/03/2020 Pre-operative Diagnosis: Necrotic right heel wound Post-operative diagnosis:  Same Surgeon:  Marty Heck, MD Procedure Performed: 1.  Ultrasound-guided access of left common femoral artery 2.  Aortogram including catheter selection of aorta 3.  Right lower extremity arteriogram with selection of third order branches 4.  Right proximal SFA angioplasty (5 mm x 60 mm Jade angioplasty baloon) 5.  39 minutes monitored moderate conscious sedation time  Indications: Patient is a 44 year old female with end-stage renal disease, left BKA, ischemic cardiomyopathy and diabetes who presents with large necrotic right ankle wound.  I previously discussed I am not sure this leg is salvageable given the significant tissue loss  and difficulty with any future soft tissue coverage.  She is adamantly against amputation and presents for arteriogram possible intervention and any attempt at limb salvage.  Findings:  Aortogram showed no flow-limiting stenosis in the aortoiliac segment.   Ultimately the bifurcation was crossed and right leg arteriogram was performed given the patient was very uncooperative during the procedure.  Patient had a patent common femoral and profunda with a proximal SFA chronic total occlusion with a short stump.  The remainder of the SFA was patent and diffusely diseased but no flow-limiting stenosis.  The popliteal artery was patent and two-vessel runoff through the peroneal and posterior tibial artery.  Ultimately the right SFA proximal occlusion was crossed and balloon angioplasty was performed of the proximal SFA with a 5 mm x 60  mm Jade balloon.  She now has inline flow down the right lower extremity.  Patient was very uncooperative throughout the case and moving and made intervention difficult.             Procedure:  The patient was identified in the holding area and taken to room 8.  The patient was then placed supine on the table and prepped and draped in the usual sterile fashion.  A time out was called.  Ultrasound was used to evaluate the left common femoral artery.  It was patent .  A digital ultrasound image was acquired.  A micropuncture needle was used to access the left common femoral artery under ultrasound guidance.  An 018 wire was advanced without resistance and a micropuncture sheath was placed.  The 018 wire was removed and a benson wire was placed.  The micropuncture sheath was exchanged for a 5 french sheath.  An omniflush catheter was advanced over the wire to the level of L-1.  An abdominal angiogram was obtained.  Next, using the omniflush catheter and a benson wire, the aortic bifurcation was crossed and the catheter was placed into theright external iliac artery and right runoff was obtained.  Please note we had to crossover because patient was so uncooperative and would not sit still during the study even with moderate conscious sedation.  Ultimately in order to give her the best chance to heal her foot wound elected to try and cross her proximal SFA occlusion.  A glidewire advantage was used to exchange for a six [place] Ansell sheath in the left groin over the aortic bifurcation.  Patient was given 100 units per kilogram heparin.  Then used a Glidewire advantage with short 035 quick cross and crossed the proximal SFA occlusion on the right and back into the true lumen confirmed hand-injection.  The proximal SFA was then balloon angioplastied with a 5 mm x 60 mm Jade balloon to nominal pressure for 2 minutes.  She has now inline flow down the right lower extremity with preserved runoff through the peroneal and  posterior tibial.  I did not elect any more complex intervention given patient was extremely uncooperative moving constantly and screaming throughout the case.  Plan: Will be loaded on Plavix and taking aspirin daily.  Plan right foot debridement tomorrow.  High risk for limb loss.     Marty Heck, MD [facility] Office: [phone]  Medications: . sodium chloride    . sodium chloride    . sodium chloride    . clindamycin (CLEOCIN) IV    . ferric gluconate (FERRLECIT/NULECIT) IV    . magnesium sulfate bolus IVPB     . amLODipine  10 mg Oral Daily  . aspirin EC  81 mg Oral Daily  . atorvastatin  40 mg Oral Daily  . calcitRIOL  1.5 mcg Oral Q T,Th,Sa-HD  . Chlorhexidine Gluconate Cloth  6 each Topical Q0600  . clopidogrel  300 mg Oral Once   Followed by  . clopidogrel  75 mg Oral Q breakfast  . dexamethasone  6 mg Oral Daily  . docusate sodium  100 mg Oral Daily  . doxercalciferol  7 mcg Intravenous Q M,W,F-HD  . gabapentin  100 mg Oral QHS  . heparin injection (subcutaneous)  5,000 Units Subcutaneous Q8H  . insulin aspart  0-15 Units Subcutaneous TID WC  . insulin aspart  0-5 Units Subcutaneous QHS  . insulin glargine  18 Units Subcutaneous QHS  . methocarbamol  500 mg Oral BID  . metoprolol succinate  12.5 mg Oral Daily  . multivitamin  1 tablet Oral QHS  . pantoprazole  40 mg Oral Daily  . polyethylene glycol  17 g Oral Daily  . sevelamer carbonate  2,400 mg Oral TID WC  . sodium chloride flush  3 mL Intravenous Q12H     Dialysis Orders: [facility] MWF 4 hrs 180NRe 400/800 99.5 kg 2.0 K/ 2.25 Ca UFP 2 L AVG -Heparin 6000 units IV TIW -Hectorol 7 mcg IV TIW -Mircera 150 mcg IV q 2 weeks (last dose 03/22/2020 Last HGB 7.7 03/27/20)  Assessment/Plan: 1. Necrotic R Heel/Critical Limb Ischemia-per VVS. Attempting limb salvage as she is refusing amputation.  Debridement of R heel in OR 7/15 per Dr. Donzetta Matters  2. ESRD -  MWF. Next HD  04/04/2020. No heparin.  3. Hypertension/volume  - BP ok. Still above OP EDW. Continue lowering as tolerated.   Amlodipine 10 mg PO q day per primary 4. Anemia  - HGB 8.0. Recent ESA dose 03/27/2020. Follow HGB 5. Metabolic bone disease -  PO4 9.5 C Ca  Usually noncompliant with binders. Resume binders when eating, continue VDRA.   6.  Nutrition - Renal diet/vitamins.  7.  DM-per primary  Lynnda Child PA-C [facility] 04/05/2020,8:56 AM

## 2020-04-05 NOTE — Progress Notes (Signed)
Inpatient Rehabilitation-Admissions Coordinator   CIR consult received. Will await therapy evaluations at this time.   Raechel Ache, OTR/L  Rehab Admissions Coordinator  [phone] 04/05/2020 11:01 AM

## 2020-04-05 NOTE — Progress Notes (Signed)
PHARMACY NOTE:  ANTIMICROBIAL RENAL DOSAGE ADJUSTMENT  Current antimicrobial regimen includes a mismatch between antimicrobial dosage and estimated renal function.  As per policy approved by the Pharmacy & Therapeutics and Medical Executive Committees, the antimicrobial dosage will be adjusted accordingly.  Current antimicrobial dosage:  Clindamycin 300mg  IV q6h  Indication: foot wound  Renal Function:  Estimated Creatinine Clearance: 11.8 mL/min (A) (by C-G formula based on SCr of 7.4 mg/dL (H)). [x]      On intermittent HD, scheduled: MWF []      On CRRT    Antimicrobial dosage has been changed to:  Clindamycin 600mg  IV q8h   Thank you for allowing pharmacy to be a part of this patient's care.  Arrie Senate, PharmD, BCPS Clinical Pharmacist [phone] Please check AMION for all [facility] numbers 04/05/2020

## 2020-04-05 NOTE — Progress Notes (Signed)
Inpatient Diabetes Program Recommendations  AACE/ADA: New Consensus Statement on Inpatient Glycemic Control (2015)  Target Ranges:  Prepandial:   less than 140 mg/dL      Peak postprandial:   less than 180 mg/dL (1-2 hours)      Critically ill patients:  140 - 180 mg/dL   Lab Results  Component Value Date   GLUCAP 301 (H) 04/05/2020   HGBA1C 8.1 (H) 09/13/2017    Review of Glycemic Control Results for Karen [name], Karen [name] (MRN [id number]) as of 04/05/2020 10:37  Ref. Range 04/04/2020 13:49 04/04/2020 16:19 04/04/2020 21:07 04/05/2020 06:14  Glucose-Capillary Latest Ref Range: 70 - 99 mg/dL 117 (H) 320 (H) 324 (H) 301 (H)   Diabetes history: DM 2 Outpatient Diabetes medications:  Lantus 18 unit q HS, Humulin R- 0-15 units tid with meals Current orders for Inpatient glycemic control:  Novolog moderate tid with meals and HS Lantus 18 units q HS Decadron 6 mg daily Inpatient Diabetes Program Recommendations:   Called and discussed with MD.  Per MD, Decadron will be d/c'd.   This should help with blood sugar control. Will follow.   Thanks,  Adah Perl, RN, BC-ADM Inpatient Diabetes Coordinator Pager [phone] (8a-5p)

## 2020-04-05 NOTE — Procedures (Signed)
Patient seen on Hemodialysis. BP (!) 177/92 (BP Location: Right Wrist)   Pulse 94   Temp 97.6 F (36.4 C) (Oral)   Resp (!) 23   Ht 5\' 6"  (1.676 m)   Wt 105.7 kg   SpO2 96%   BMI 37.61 kg/m   QB 400, UF goal 2.5L Tolerating treatment with complaints of right heel discomfort and concerns of disposition at this time.  Elmarie Shiley MD [facility]. Office # [phone] 9:47 AM

## 2020-04-05 NOTE — Evaluation (Signed)
Physical Therapy Evaluation Patient Details Name: Karen [name] MRN: [id number] DOB: 1976/08/31 Today's Date: 04/05/2020   History of Present Illness  Pt is a 44 y.o. female, with history of end-stage renal disease on hemodialysis MWF, HTN, L BKA, PAD, ischemic cardiomyopathy, diabetes, previous stroke with some ongoing dysphagia.  She presented for evaluation of right heel wound.  Pt s/p R proximal SFA angioplasty on 04/03/20 and debridement of R heel skin and soft tissue on 04/04/20.  Per vascular surgery notes , pt remains high risk for limb loss.  Clinical Impression  Pt admitted with above diagnosis. Evaluation was significantly limited due to pt lethargy - she had hemodialysis earlier today.  Pt only able to participate with bed mobility with assistance.  Pt demonstrating significant weaknesses throughout trunk and all extremities.  At home, pt with intermittent support from personal care aides and is non-ambulatory.  Reports that she requires assistance with ADLs and transfers.  Pt with fair rehab potential as she will be limited by PLOF and comorbidities.  Pt with increased risk for limb loss on R per surgeon notes, so she will need to limit weight on this extremity from therapy perspective to allow best chance of healing.  This will be difficult due to pt with BKA on contralateral side. Pt currently with functional limitations due to the deficits listed below (see PT Problem List). Pt will benefit from skilled PT to increase their independence and safety with mobility to allow discharge to the venue listed below.       Follow Up Recommendations SNF    Equipment Recommendations  None recommended by PT (has DME)    Recommendations for Other Services       Precautions / Restrictions Precautions Precautions: Fall Restrictions Other Position/Activity Restrictions: No formal wbing restrictions: however, considering high risk of limb loss and R heel wound - recommend keeping weight off R heel  as able.      Mobility  Bed Mobility Overal bed mobility: Needs Assistance Bed Mobility: Rolling Rolling: Min assist         General bed mobility comments: Pt rolled to both sides with min A and use of bed rails.  Declined further mobility due to fatigue from HD today.  Transfers                    Ambulation/Gait                Stairs            Wheelchair Mobility    Modified Rankin (Stroke Patients Only)       Balance Overall balance assessment: Needs assistance     Sitting balance - Comments: deferred - pt unable                                     Pertinent Vitals/Pain Pain Assessment: Faces Faces Pain Scale: Hurts a little bit Pain Location: R foot Pain Descriptors / Indicators: Discomfort Pain Intervention(s): Limited activity within patient's tolerance    Home Living Family/patient expects to be discharged to:: Private residence Living Arrangements: Children (lives with 85 yo son) Available Help at Discharge: Family;Personal care attendant;Available PRN/intermittently Type of Home: House       Home Layout: One level Home Equipment: Wheelchair - manual;Shower seat;Hospital bed      Prior Function Level of Independence: Needs assistance   Gait / Transfers Assistance Needed: Does  not walk; only performs stand pivots to w/c with physical assistance  ADL's / Homemaking Assistance Needed: Reports aides 7 days week (hours vary throughout week but has assitance most all waking hours); has assistance with ADLs        Hand Dominance        Extremity/Trunk Assessment   Upper Extremity Assessment Upper Extremity Assessment: RUE deficits/detail;LUE deficits/detail RUE Deficits / Details: ROM WFL; MMT: shoulder 1/5, elbow/hand 3/5 LUE Deficits / Details: ROM WFL; MMT: shoulder 1/5, elbow/hand 3/5    Lower Extremity Assessment Lower Extremity Assessment: LLE deficits/detail;RLE deficits/detail RLE Deficits /  Details: ROM : WFL except ankle not fully tested due to painful from sx site; MMT: 2/5 throughout LLE Deficits / Details: Hx of BKA; ROM WFL; MMT hip 2/5, knee 2/5       Communication   Communication: Expressive difficulties (slow speech, soft , raspy voice)  Cognition Arousal/Alertness: Lethargic Behavior During Therapy: WFL for tasks assessed/performed Overall Cognitive Status: Within Functional Limits for tasks assessed                                        General Comments General comments (skin integrity, edema, etc.): Pt had dialysis earlier today and is extremely lethargic.  Initially, she was able to stay awake and answer questions but fatigued quickly and falling asleep.    Exercises     Assessment/Plan    PT Assessment Patient needs continued PT services  PT Problem List Decreased strength;Decreased mobility;Decreased safety awareness;Decreased range of motion;Decreased knowledge of precautions;Decreased activity tolerance;Cardiopulmonary status limiting activity;Decreased balance;Decreased knowledge of use of DME       PT Treatment Interventions DME instruction;Therapeutic activities;Therapeutic exercise;Patient/family education;Balance training;Wheelchair mobility training;Functional mobility training    PT Goals (Current goals can be found in the Care Plan section)  Acute Rehab PT Goals Patient Stated Goal: get stronger and be able to return home PT Goal Formulation: With patient Time For Goal Achievement: 04/19/20 Potential to Achieve Goals: Fair    Frequency Min 3X/week   Barriers to discharge Decreased caregiver support      Co-evaluation               AM-PAC PT "6 Clicks" Mobility  Outcome Measure Help needed turning from your back to your side while in a flat bed without using bedrails?: A Little Help needed moving from lying on your back to sitting on the side of a flat bed without using bedrails?: A Lot Help needed moving to  and from a bed to a chair (including a wheelchair)?: Total Help needed standing up from a chair using your arms (e.g., wheelchair or bedside chair)?: Total Help needed to walk in hospital room?: Total Help needed climbing 3-5 steps with a railing? : Total 6 Click Score: 9    End of Session   Activity Tolerance: Patient limited by lethargy Patient left: in bed;with call bell/phone within reach;with bed alarm set Nurse Communication: Mobility status PT Visit Diagnosis: Muscle weakness (generalized) (M62.81);Unsteadiness on feet (R26.81)    Time: 4970-2637 PT Time Calculation (min) (ACUTE ONLY): 20 min   Charges:   PT Evaluation $PT Eval Low Complexity: 1 Low          Leeland Lovelady, PT Acute Rehab Services Pager [phone] [facility] [phone]    Karlton Lemon 04/05/2020, 5:30 PM

## 2020-04-06 ENCOUNTER — Other Ambulatory Visit: Payer: Self-pay | Admitting: Legal Medicine

## 2020-04-06 LAB — BASIC METABOLIC PANEL
Anion gap: 14 (ref 5–15)
BUN: 20 mg/dL (ref 6–20)
CO2: 25 mmol/L (ref 22–32)
Calcium: 8.5 mg/dL — ABNORMAL LOW (ref 8.9–10.3)
Chloride: 98 mmol/L (ref 98–111)
Creatinine, Ser: 4.95 mg/dL — ABNORMAL HIGH (ref 0.44–1.00)
GFR calc Af Amer: 12 mL/min — ABNORMAL LOW (ref >60)
GFR calc non Af Amer: 10 mL/min — ABNORMAL LOW (ref >60)
Glucose, Bld: 138 mg/dL — ABNORMAL HIGH (ref 70–99)
Potassium: 3.8 mmol/L (ref 3.5–5.1)
Sodium: 137 mmol/L (ref 135–145)

## 2020-04-06 LAB — CBC
HCT: 24.2 % — ABNORMAL LOW (ref 36.0–46.0)
Hemoglobin: 7.2 g/dL — ABNORMAL LOW (ref 12.0–15.0)
MCH: 28.3 pg (ref 26.0–34.0)
MCHC: 29.8 g/dL — ABNORMAL LOW (ref 30.0–36.0)
MCV: 95.3 fL (ref 80.0–100.0)
Platelets: 144 10*3/uL — ABNORMAL LOW (ref 150–400)
RBC: 2.54 MIL/uL — ABNORMAL LOW (ref 3.87–5.11)
RDW: 17 % — ABNORMAL HIGH (ref 11.5–15.5)
WBC: 8.1 10*3/uL (ref 4.0–10.5)
nRBC: 0 % (ref 0.0–0.2)

## 2020-04-06 LAB — GLUCOSE, CAPILLARY
Glucose-Capillary: 138 mg/dL — ABNORMAL HIGH (ref 70–99)
Glucose-Capillary: 93 mg/dL (ref 70–99)
Glucose-Capillary: 107 mg/dL — ABNORMAL HIGH (ref 70–99)
Glucose-Capillary: 193 mg/dL — ABNORMAL HIGH (ref 70–99)
Glucose-Capillary: 145 mg/dL — ABNORMAL HIGH (ref 70–99)

## 2020-04-06 NOTE — Progress Notes (Addendum)
  Progress Note    04/06/2020 11:30 AM 2 Days Post-Op  Subjective:  Says her foot hurts this morning.  Says she has family taking care of her son.  Tm 100 now 99.6 HR 90's-110's NSR 299'M-426'S systolic 34% RA  Vitals:   04/06/20 0514 04/06/20 0732  BP: (!) 127/50 (!) 133/52  Pulse:    Resp: 20   Temp: 100 F (37.8 C) 99.6 F (37.6 C)  SpO2: 96%     Physical Exam: Cardiac:  regular Lungs:  Non labored Incisions:  Right heel wound with odor Extremities:  Brisk right DP doppler signal   CBC    Component Value Date/Time   WBC 8.1 04/06/2020 0449   RBC 2.54 (L) 04/06/2020 0449   HGB 7.2 (L) 04/06/2020 0449   HCT 24.2 (L) 04/06/2020 0449   PLT 144 (L) 04/06/2020 0449   MCV 95.3 04/06/2020 0449   MCH 28.3 04/06/2020 0449   MCHC 29.8 (L) 04/06/2020 0449   RDW 17.0 (H) 04/06/2020 0449   LYMPHSABS 1.5 04/02/2019 2243   MONOABS 0.6 04/02/2019 2243   EOSABS 0.2 04/02/2019 2243   BASOSABS 0.0 04/02/2019 2243    BMET    Component Value Date/Time   NA 137 04/06/2020 0449   K 3.8 04/06/2020 0449   CL 98 04/06/2020 0449   CO2 25 04/06/2020 0449   GLUCOSE 138 (H) 04/06/2020 0449   BUN 20 04/06/2020 0449   CREATININE 4.95 (H) 04/06/2020 0449   CALCIUM 8.5 (L) 04/06/2020 0449   GFRNONAA 10 (L) 04/06/2020 0449   GFRAA 12 (L) 04/06/2020 0449    INR    Component Value Date/Time   INR 1.15 02/08/2013 0500     Intake/Output Summary (Last 24 hours) at 04/06/2020 1130 Last data filed at 04/06/2020 0523 Gross per 24 hour  Intake 340 ml  Output 2500 ml  Net -2160 ml     Assessment:  44 y.o. female is s/p:  Sharp excisional debridement of right heel skin and soft tissueto7 x 5 cm  2 Days Post-Op  Plan: -pt with brisk right DP doppler signal.   -dressing removed and wound does not appear to be progressing and has an odor.  Dr. Oneida Alar discussed below knee amputation with pt and she does not want to proceed with that at this time.  Dr. Oneida Alar did discuss with  her that this wound could worsen, cause systemic infection and risk of death and does not want to proceed with amputation.  She does have low grade fever.   Continue wet to dry dressings bid -acute blood loss anemia-tolerating -will check labs tomorrow.  -PT recommending SNF.  TOC consulted for disposition yesterday.   Leontine Locket, PA-C [facility] [phone] 04/06/2020 11:30 AM  Brisk DP doppler Necrotic heel Discussed BKA.  Pt does not want this currently SNF when available  Ruta Hinds, MD [facility] [place] Office: [phone]

## 2020-04-07 LAB — CBC
HCT: 22.8 % — ABNORMAL LOW (ref 36.0–46.0)
Hemoglobin: 6.8 g/dL — CL (ref 12.0–15.0)
MCH: 28.1 pg (ref 26.0–34.0)
MCHC: 29.8 g/dL — ABNORMAL LOW (ref 30.0–36.0)
MCV: 94.2 fL (ref 80.0–100.0)
Platelets: 144 10*3/uL — ABNORMAL LOW (ref 150–400)
RBC: 2.42 MIL/uL — ABNORMAL LOW (ref 3.87–5.11)
RDW: 16.7 % — ABNORMAL HIGH (ref 11.5–15.5)
WBC: 8.9 10*3/uL (ref 4.0–10.5)
nRBC: 0 % (ref 0.0–0.2)

## 2020-04-07 LAB — GLUCOSE, CAPILLARY
Glucose-Capillary: 123 mg/dL — ABNORMAL HIGH (ref 70–99)
Glucose-Capillary: 93 mg/dL (ref 70–99)
Glucose-Capillary: 78 mg/dL (ref 70–99)
Glucose-Capillary: 72 mg/dL (ref 70–99)

## 2020-04-07 LAB — PREPARE RBC (CROSSMATCH)

## 2020-04-07 NOTE — Progress Notes (Signed)
CRITICAL VALUE ALERT  Critical Value:  hgb 6.8  Date & Time Notied:  7/18 0453  Provider Notified: Oneida Alar MD  Orders Received/Actions taken: n/a, to address in AM

## 2020-04-07 NOTE — Social Work (Deleted)
To Whom it May Concern, Please be advised that the above named patient will require a short term nursing home stay. Anticipated to be 30 days or less for rehabilitation and strengthening. The plan is for return home.    Patient: Karen [name]. [name] DOB: September 15, 1976

## 2020-04-07 NOTE — TOC Progression Note (Signed)
Transition of Care [facility]) - Progression Note    Patient Details  Name: Karen [name] MRN: [id number] Date of Birth: 09-20-1976  Transition of Care [facility]) CM/SW [place], [place] Phone Number: [phone] 04/07/2020, 10:07 AM  Clinical Narrative:     CSW met with patient to discuss the recommendation of a SNF. Patient was in agreement with the recommendation and wanted to place to a facility in [place]. Patient stated that she did not want to go to [facility]. CSW received permission to fax out and CSW provided her with the medicare.gov rating list.   CSW inquired about patient's child and patient stated that her pastor and family are assisting with care.  TOC team will continue to assist with discharge planning needs.   Expected Discharge Plan: Skilled Nursing Facility Barriers to Discharge: Continued Medical Work up, SNF Pending bed offer  Expected Discharge Plan and Services Expected Discharge Plan: [place]   Discharge Planning Services: CM Consult                                           Social Determinants of Health (SDOH) Interventions    Readmission Risk Interventions Readmission Risk Prevention Plan 04/04/2020  Social Work Consult for [place] Planning/Counseling Complete  Medication Review Press photographer) Complete  Some recent data might be hidden

## 2020-04-07 NOTE — NC FL2 (Addendum)
[place] LEVEL OF CARE SCREENING TOOL     IDENTIFICATION  Patient Name: Karen [name] Birthdate: [birth year] Sex: female Admission Date (Current Location): 04/03/2020  [facility] and [place] Number:  [employer] and Address:  [facility] [place] [address] [place], [place] [address]      Provider Number: [id number]  Attending Physician Name and Address:  Marty Heck, MD  Relative Name and Phone Number:       Current Level of Care: Hospital Recommended Level of Care: [place] Prior Approval Number:    Date Approved/Denied:   PASRR Number: [id number]  Discharge Plan: SNF    Current Diagnoses: Patient Active Problem List   Diagnosis Date Noted   Nausea and vomiting 02/28/2020   Abrasion 02/28/2020   Avulsion fracture of ankle 02/28/2020   Drug or medicinal substance causing adverse effect in therapeutic use 02/28/2020   Elevated brain natriuretic peptide (BNP) level 02/28/2020   HLD (hyperlipidemia) 02/28/2020   Hypertension, poor control 02/28/2020   Hypertensive emergency 02/28/2020   Hypoxia 02/28/2020   Mild dehydration 02/28/2020   Sedimentation rate elevation 02/28/2020   Strange and inexplicable behavior 09/73/5329   COVID-19 01/19/2020   Dysphasia status post cerebrovascular accident 11/06/2019   Lumbar spondylosis 11/06/2019   Anxiety and depression 06/04/2019   Cerebral infarction due to thrombosis of unspecified precerebral artery ([place]) 06/04/2019   Chronic pain syndrome 06/04/2019   Neck pain on right side 06/04/2019   Stroke due to embolism of right middle cerebral artery ([place]) 06/02/2019   Pulmonary edema 02/20/2019   Excoriation (skin-picking) disorder 11/22/2018   Type 2 diabetes mellitus with hyperglycemia ([place]) 11/14/2018   SBO (small bowel obstruction) ([place]) 07/08/2018   Small bowel obstruction ([place]) 07/08/2018   Hypercalcemia 02/17/2018   Weakness     Acute on chronic systolic CHF (congestive heart failure) ([place])    ESRD on dialysis ([place])    Homelessness    Late effect of cerebrovascular accident (CVA)    Dyspnea 01/27/2018   Hypertension, uncontrolled 11/11/2017   Fluid overload 11/11/2017   Abdominal pain    Sepsis due to methicillin resistant Staphylococcus aureus ([place]) 11/01/2017   Penicillin allergy 10/15/2017   Thrombocytopenia ([employer]) 10/15/2017   Personal history of transient ischemic attack (TIA), and cerebral infarction without residual deficits 09/15/2017   Dysarthria 09/14/2017   Lacunar infarct, acute ([place]) 09/14/2017   Obesity 09/09/2017   Chest pain 09/07/2017   Other staphylococcus as the cause of diseases classified elsewhere 08/23/2017   Diarrhea, unspecified 12/29/2016   Fluid overload, unspecified 10/03/2016   Hyperkalemia 10/03/2016   Hypertension with fluid overload 10/03/2016   Anemia in other chronic diseases classified elsewhere 09/08/2016   High anion gap metabolic acidosis 92/42/6834   Right lower lobe pneumonia 09/08/2016   Iron deficiency anemia, unspecified 19/62/2297   Complication of vascular dialysis catheter 02/18/2016   Encounter for removal of sutures 02/11/2016   Uncontrolled type 2 diabetes mellitus with chronic kidney disease on chronic dialysis, with long-term current use of insulin ([place]) 01/31/2016   Drug noncompliance    End-stage renal disease on hemodialysis ([place])    Unspecified protein-calorie malnutrition ([place]) 01/24/2016   Secondary hyperparathyroidism of renal origin ([place]) 01/22/2016   Acquired absence of left leg below knee ([place]) 01/16/2016   CHF (congestive heart failure) ([place]) 01/16/2016   Coagulation defect, unspecified ([place]) 01/16/2016   Encounter for immunization 01/16/2016   Family history of diabetes mellitus 01/16/2016   Hypertensive chronic  kidney disease with stage 1 through stage 4 chronic kidney disease, or unspecified chronic  kidney disease 01/16/2016   Nicotine dependence, other tobacco product, in remission 01/16/2016   Pain, unspecified 01/16/2016   Pruritus, unspecified 01/16/2016   Red blood cell antibody positive 01/10/2016   Hx of BKA ([place]) 07/03/2013   S/P BKA (below knee amputation) unilateral ([place]) 06/30/2013   Physical deconditioning 06/30/2013   Cardiomyopathy, ischemic - EF 45-50% with inf WMA by 2D 02/05/13 02/06/2013   Essential hypertension 02/05/2013   PAD (peripheral artery disease) ([employer]) 02/05/2013   Diabetic neuropathy (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 02/06/13 04/21/2011    Orientation RESPIRATION BLADDER Height & Weight     Self, Time, Situation, Place    Continent Weight: 227 lb 11.8 oz (103.3 kg) Height:  5\' 6"  (167.6 cm)  BEHAVIORAL SYMPTOMS/MOOD NEUROLOGICAL BOWEL NUTRITION STATUS      Continent Diet (see discharge summary)  AMBULATORY STATUS COMMUNICATION OF NEEDS Skin   Extensive Assist Verbally Other (Comment) (generalized excoriation; closed incision on R foot w/ wet to dry dressing; R heel w/ necrotic wound)                       Personal Care Assistance Level of Assistance  Bathing, Feeding, Dressing Bathing Assistance: Maximum assistance Feeding assistance: Independent Dressing Assistance: Maximum assistance     Functional Limitations Info  Sight, Hearing, Speech Sight Info: Adequate Hearing Info: Adequate Speech Info: Adequate    SPECIAL CARE FACTORS FREQUENCY  PT (By licensed PT), OT (By licensed OT)     PT Frequency: 5x week OT Frequency: 5x week            Contractures Contractures Info: Not present    Additional Factors Info  Code Status, Allergies, Insulin Sliding Scale Code Status Info: Full Code Allergies Info: Baclofen, Penicillins, Morphine And Related, Novolog (Insulin Aspart)   Insulin Sliding Scale Info: insulin aspart (novoLOG) injection 0-15 Units 3x daily with meals; insulin aspart (novoLOG)  injection 0-5 Units daily at bedtime; insulin glargine (LANTUS) injection 18 Units daily at bedtime       Current Medications (04/07/2020):  This is the current hospital active medication list Current Facility-Administered Medications  Medication Dose Route Frequency Provider Last Rate Last Admin   0.9 %  sodium chloride infusion  250 mL Intravenous PRN Baglia, Corrina, PA-C       0.9 %  sodium chloride infusion  100 mL Intravenous PRN Baglia, Corrina, PA-C       0.9 %  sodium chloride infusion  100 mL Intravenous PRN Baglia, Corrina, PA-C       acetaminophen (TYLENOL) tablet 325-650 mg  325-650 mg Oral Q4H PRN Baglia, Corrina, PA-C   325 mg at 04/07/20 0901   Or   acetaminophen (TYLENOL) suppository 325-650 mg  325-650 mg Rectal Q4H PRN Baglia, Corrina, PA-C       alum & mag hydroxide-simeth (MAALOX/MYLANTA) 200-200-20 MG/5ML suspension 15-30 mL  15-30 mL Oral Q2H PRN Baglia, Corrina, PA-C       amLODipine (NORVASC) tablet 10 mg  10 mg Oral Daily Baglia, Corrina, PA-C   10 mg at 04/06/20 9702   aspirin EC tablet 81 mg  81 mg Oral Daily Baglia, Corrina, PA-C   81 mg at 04/07/20 0858   atorvastatin (LIPITOR) tablet 40 mg  40 mg Oral Daily Baglia, Corrina, PA-C       calcitRIOL (ROCALTROL) capsule 1.5 mcg  1.5 mcg  Oral Q T,Th,Sa-HD Baglia, Corrina, PA-C       Chlorhexidine Gluconate Cloth 2 % PADS 6 each  6 each Topical Q0600 Baglia, Corrina, PA-C   6 each at 04/07/20 0859   clindamycin (CLEOCIN) IVPB 600 mg  600 mg Intravenous Q8H Einar Grad, RPH 100 mL/hr at 04/07/20 0545 600 mg at 04/07/20 0545   clopidogrel (PLAVIX) tablet 300 mg  300 mg Oral Once Baglia, Corrina, PA-C       Followed by   clopidogrel (PLAVIX) tablet 75 mg  75 mg Oral Q breakfast Baglia, Corrina, PA-C   75 mg at 04/07/20 0858   diphenhydrAMINE (BENADRYL) capsule 25 mg  25 mg Oral Q6H PRN Marty Heck, MD   25 mg at 04/04/20 2146   docusate sodium (COLACE) capsule 100 mg  100 mg Oral Daily  Baglia, Corrina, PA-C       doxercalciferol (HECTOROL) injection 7 mcg  7 mcg Intravenous Q M,W,F-HD Baglia, Corrina, PA-C   7 mcg at 04/05/20 1028   ferric gluconate (NULECIT) 62.5 mg in sodium chloride 0.9 % 100 mL IVPB  62.5 mg Intravenous Q Thu-HD Baglia, Corrina, PA-C       gabapentin (NEURONTIN) capsule 100 mg  100 mg Oral QHS Baglia, Corrina, PA-C   100 mg at 04/05/20 2054   guaiFENesin-dextromethorphan (ROBITUSSIN DM) 100-10 MG/5ML syrup 15 mL  15 mL Oral Q4H PRN Baglia, Corrina, PA-C       heparin injection 1,900 Units  20 Units/kg Dialysis PRN Baglia, Corrina, PA-C       heparin injection 5,000 Units  5,000 Units Subcutaneous Q8H Baglia, Corrina, PA-C   5,000 Units at 04/07/20 0546   hydrALAZINE (APRESOLINE) injection 5 mg  5 mg Intravenous Q20 Min PRN Baglia, Corrina, PA-C       insulin aspart (novoLOG) injection 0-15 Units  0-15 Units Subcutaneous TID WC Baglia, Corrina, PA-C   3 Units at 04/06/20 1735   insulin aspart (novoLOG) injection 0-5 Units  0-5 Units Subcutaneous QHS Marty Heck, MD   4 Units at 04/04/20 2221   insulin glargine (LANTUS) injection 18 Units  18 Units Subcutaneous QHS Baglia, Corrina, PA-C   18 Units at 04/06/20 2058   labetalol (NORMODYNE) injection 10 mg  10 mg Intravenous Q10 min PRN Baglia, Corrina, PA-C       lidocaine (PF) (XYLOCAINE) 1 % injection 5 mL  5 mL Intradermal PRN Baglia, Corrina, PA-C       lidocaine-prilocaine (EMLA) cream 1 application  1 application Topical PRN Baglia, Corrina, PA-C       magnesium sulfate IVPB 2 g 50 mL  2 g Intravenous Daily PRN Baglia, Corrina, PA-C       methocarbamol (ROBAXIN) tablet 500 mg  500 mg Oral BID Baglia, Corrina, PA-C   500 mg at 04/07/20 0858   metoprolol succinate (TOPROL-XL) 24 hr tablet 12.5 mg  12.5 mg Oral Daily Baglia, Corrina, PA-C   12.5 mg at 04/07/20 0901   metoprolol tartrate (LOPRESSOR) injection 2-5 mg  2-5 mg Intravenous Q2H PRN Baglia, Corrina, PA-C       morphine 2  MG/ML injection 2-5 mg  2-5 mg Intravenous Q1H PRN Baglia, Corrina, PA-C       multivitamin (RENA-VIT) tablet 1 tablet  1 tablet Oral QHS Baglia, Corrina, PA-C   1 tablet at 04/07/20 0858   ondansetron (ZOFRAN) injection 4 mg  4 mg Intravenous Q6H PRN Baglia, Corrina, PA-C   4 mg at 04/05/20 2348  ondansetron (ZOFRAN) tablet 4 mg  4 mg Oral Daily PRN Baglia, Corrina, PA-C   4 mg at 04/06/20 1628   oxyCODONE (Oxy IR/ROXICODONE) immediate release tablet 5 mg  5 mg Oral Q4H PRN Baglia, Corrina, PA-C   5 mg at 04/07/20 0900   pantoprazole (PROTONIX) EC tablet 40 mg  40 mg Oral Daily Baglia, Corrina, PA-C   40 mg at 04/07/20 0902   pentafluoroprop-tetrafluoroeth (GEBAUERS) aerosol 1 application  1 application Topical PRN Baglia, Corrina, PA-C       phenol (CHLORASEPTIC) mouth spray 1 spray  1 spray Mouth/Throat PRN Baglia, Corrina, PA-C       polyethylene glycol (MIRALAX / GLYCOLAX) packet 17 g  17 g Oral Daily Baglia, Corrina, PA-C       potassium chloride SA (KLOR-CON) CR tablet 20-40 mEq  20-40 mEq Oral Daily PRN Baglia, Corrina, PA-C       senna-docusate (Senokot-S) tablet 1 tablet  1 tablet Oral QHS PRN Baglia, Corrina, PA-C       sevelamer carbonate (RENVELA) tablet 2,400 mg  2,400 mg Oral TID WC Valentina Gu, NP   2,400 mg at 04/06/20 1627   sodium chloride flush (NS) 0.9 % injection 3 mL  3 mL Intravenous Q12H Baglia, Corrina, PA-C   3 mL at 04/06/20 2109   sodium chloride flush (NS) 0.9 % injection 3 mL  3 mL Intravenous PRN Karoline Caldwell, PA-C         Discharge Medications: Please see discharge summary for a list of discharge medications.  Relevant Imaging Results:  Relevant Lab Results:   Additional Information SS#: [id number]; HD TTS in [place], LCSW

## 2020-04-07 NOTE — Progress Notes (Signed)
OT Cancellation Note  Patient Details Name: Karen [name] MRN: [id number] DOB: May 09, 1976   Cancelled Treatment:    Reason Eval/Treat Not Completed: Medical issues which prohibited therapy. Patient with dropping H+H now 6.8 hemoglobin. Per chart going to be transfused tomorrow at HD. Will check back likely 7/20.   Delbert Phenix OT OT office: [place] 04/07/2020, 11:22 AM

## 2020-04-07 NOTE — Progress Notes (Signed)
Patient ID: Karen [name], female   DOB: 08-03-1976, 44 y.o.   MRN: [id number] [facility] Progress Note   Assessment/ Plan:   1. Critical limb ischemia with necrotic right heel ulcer status post sharp excisional debridement: Unfortunately poor prognostic markers based on exam for limb salvage however patient remains adamant that she does not want BKA at this time.   Remains febrile on clindamycin monotherapy and getting wound care per vascular surgery recommendations.  Unsuitable for discharge given persistent fevers. 2. ESRD -continue hemodialysis on a Monday/Wednesday/Friday schedule with next dialysis ordered for tomorrow. 3. Hypertension/volume -she appears to be close to euvolemic on physical exam and has been normotensive.  Will monitor cautiously with dialysis. 4. Anemia -declining hemoglobin and hematocrit noted, will transfuse 1 unit with hemodialysis tomorrow (with significant inflammatory complex from nonhealing heel ulcer/ongoing bleeding). 5. Metabolic bone disease -previous labs significant for elevated phosphorus level, continue binders for phosphorus lowering and continue VDRA for PTH suppression. 6. Nutrition -continue renal diet with fluid restriction and ongoing ONS.   Subjective:   Reports pain in her heel on and off.  Remains adamant that she does not want amputation.   Objective:   BP (!) 114/55 (BP Location: Right Wrist)   Pulse 96   Temp (!) 100.8 F (38.2 C) (Oral)   Resp 20   Ht 5\' 6"  (1.676 m)   Wt 103.3 kg   SpO2 100%   BMI 36.76 kg/m   Intake/Output Summary (Last 24 hours) at 04/07/2020 5093 Last data filed at 04/07/2020 0200 Gross per 24 hour  Intake 600 ml  Output --  Net 600 ml   Weight change:   Physical Exam: Gen: Comfortably resting in bed, somnolent and somewhat lethargic with responses. CVS: Pulse regular rhythm, normal rate, S1 and S2 normal Resp: Clear to auscultation, no rales/rhonchi Abd: Soft, flat, nontender Ext: Right  heel with intact gauze dressing, no significant edema.  Left upper arm AV graft with bruit.  Imaging: No results found.  Labs: BMET Recent Labs  Lab 04/03/20 0911 04/03/20 2127 04/04/20 1458 04/05/20 0408 04/06/20 0449  NA 137 136  --  133* 137  K 5.1 4.7  --  4.8 3.8  CL 99 97*  --  95* 98  CO2  --  20*  --  23 25  GLUCOSE 194* 189*  --  300* 138*  BUN 63* 53*  --  33* 20  CREATININE 13.20* 12.23* 6.73* 7.40* 4.95*  CALCIUM  --  8.6*  --  8.8* 8.5*  PHOS  --  9.5*  --   --   --    CBC Recent Labs  Lab 04/04/20 1458 04/05/20 0408 04/06/20 0449 04/07/20 0355  WBC 7.4 8.0 8.1 8.9  HGB 8.0* 7.8* 7.2* 6.8*  HCT 26.8* 26.2* 24.2* 22.8*  MCV 95.4 94.9 95.3 94.2  PLT 153 141* 144* 144*   Medications:    . amLODipine  10 mg Oral Daily  . aspirin EC  81 mg Oral Daily  . atorvastatin  40 mg Oral Daily  . calcitRIOL  1.5 mcg Oral Q T,Th,Sa-HD  . Chlorhexidine Gluconate Cloth  6 each Topical Q0600  . clopidogrel  300 mg Oral Once   Followed by  . clopidogrel  75 mg Oral Q breakfast  . docusate sodium  100 mg Oral Daily  . doxercalciferol  7 mcg Intravenous Q M,W,F-HD  . gabapentin  100 mg Oral QHS  . heparin injection (subcutaneous)  5,000 Units Subcutaneous  Q8H  . insulin aspart  0-15 Units Subcutaneous TID WC  . insulin aspart  0-5 Units Subcutaneous QHS  . insulin glargine  18 Units Subcutaneous QHS  . methocarbamol  500 mg Oral BID  . metoprolol succinate  12.5 mg Oral Daily  . multivitamin  1 tablet Oral QHS  . pantoprazole  40 mg Oral Daily  . polyethylene glycol  17 g Oral Daily  . sevelamer carbonate  2,400 mg Oral TID WC  . sodium chloride flush  3 mL Intravenous Q12H   Elmarie Shiley, MD 04/07/2020, 9:24 AM

## 2020-04-07 NOTE — Progress Notes (Addendum)
  Progress Note    04/07/2020 9:59 AM 3 Days Post-Op  Subjective:  Pt continues to refuse amputation.  Told her that she could possibly get sick and die. She states she does not want to die and she believes in the man and Jesus is going to cure her.   Tm 100.8 HR 90's-100's  673'A-193'X systolic 90% 2IO9BD  Vitals:   04/07/20 0310 04/07/20 0804  BP: (!) 114/58 (!) 114/55  Pulse: 60 96  Resp: 18 20  Temp: 100.1 F (37.8 C) (!) 100.8 F (38.2 C)  SpO2: 100% 100%    Physical Exam: General:  No distress Lungs:  Non labored Incisions:  Dressing in tact; right foot is malodorous   CBC    Component Value Date/Time   WBC 8.9 04/07/2020 0355   RBC 2.42 (L) 04/07/2020 0355   HGB 6.8 (LL) 04/07/2020 0355   HCT 22.8 (L) 04/07/2020 0355   PLT 144 (L) 04/07/2020 0355   MCV 94.2 04/07/2020 0355   MCH 28.1 04/07/2020 0355   MCHC 29.8 (L) 04/07/2020 0355   RDW 16.7 (H) 04/07/2020 0355   LYMPHSABS 1.5 04/02/2019 2243   MONOABS 0.6 04/02/2019 2243   EOSABS 0.2 04/02/2019 2243   BASOSABS 0.0 04/02/2019 2243    BMET    Component Value Date/Time   NA 137 04/06/2020 0449   K 3.8 04/06/2020 0449   CL 98 04/06/2020 0449   CO2 25 04/06/2020 0449   GLUCOSE 138 (H) 04/06/2020 0449   BUN 20 04/06/2020 0449   CREATININE 4.95 (H) 04/06/2020 0449   CALCIUM 8.5 (L) 04/06/2020 0449   GFRNONAA 10 (L) 04/06/2020 0449   GFRAA 12 (L) 04/06/2020 0449    INR    Component Value Date/Time   INR 1.15 02/08/2013 0500     Intake/Output Summary (Last 24 hours) at 04/07/2020 0959 Last data filed at 04/07/2020 0200 Gross per 24 hour  Intake 600 ml  Output --  Net 600 ml     Assessment:  44 y.o. female is s/p:  Sharp excisional debridement of right heel skin and soft tissueto7 x 5 cm  3 Days Post-Op  Plan: -pt resting in bed - right foot malodorous.  Pt continues to refuse amputation.  She is now running fever of 100.8 this am. Told her that she could get sick and die from this  wound. She states she does not want to die and she believes in the man and Jesus is going to cure her. -acute blood loss anemia-hgb 6.8 this am down from 7.2 yesterday. Currently tolerating.  Going to be transfused per renal tomorrow on HD -Dressing is dry this am. -please keep heel floated off bed   Leontine Locket, PA-C [facility] [phone] 04/07/2020 9:59 AM  Necrotic heel.  Still does not want amp. SNF when available Agree with above.  Ruta Hinds, MD [facility] [place] Office: [phone]

## 2020-04-08 LAB — RENAL FUNCTION PANEL
Albumin: 2.5 g/dL — ABNORMAL LOW (ref 3.5–5.0)
Anion gap: 16 — ABNORMAL HIGH (ref 5–15)
BUN: 38 mg/dL — ABNORMAL HIGH (ref 6–20)
CO2: 24 mmol/L (ref 22–32)
Calcium: 8.5 mg/dL — ABNORMAL LOW (ref 8.9–10.3)
Chloride: 96 mmol/L — ABNORMAL LOW (ref 98–111)
Creatinine, Ser: 8.05 mg/dL — ABNORMAL HIGH (ref 0.44–1.00)
GFR calc Af Amer: 6 mL/min — ABNORMAL LOW (ref >60)
GFR calc non Af Amer: 6 mL/min — ABNORMAL LOW (ref >60)
Glucose, Bld: 92 mg/dL (ref 70–99)
Phosphorus: 6.6 mg/dL — ABNORMAL HIGH (ref 2.5–4.6)
Potassium: 4.7 mmol/L (ref 3.5–5.1)
Sodium: 136 mmol/L (ref 135–145)

## 2020-04-08 LAB — CBC
HCT: 22.5 % — ABNORMAL LOW (ref 36.0–46.0)
HCT: 29.8 % — ABNORMAL LOW (ref 36.0–46.0)
Hemoglobin: 6.6 g/dL — CL (ref 12.0–15.0)
Hemoglobin: 9.3 g/dL — ABNORMAL LOW (ref 12.0–15.0)
MCH: 27.6 pg (ref 26.0–34.0)
MCH: 28.1 pg (ref 26.0–34.0)
MCHC: 29.3 g/dL — ABNORMAL LOW (ref 30.0–36.0)
MCHC: 31.2 g/dL (ref 30.0–36.0)
MCV: 94.1 fL (ref 80.0–100.0)
MCV: 90 fL (ref 80.0–100.0)
Platelets: 148 10*3/uL — ABNORMAL LOW (ref 150–400)
Platelets: 111 10*3/uL — ABNORMAL LOW (ref 150–400)
RBC: 2.39 MIL/uL — ABNORMAL LOW (ref 3.87–5.11)
RBC: 3.31 MIL/uL — ABNORMAL LOW (ref 3.87–5.11)
RDW: 16.5 % — ABNORMAL HIGH (ref 11.5–15.5)
RDW: 16.6 % — ABNORMAL HIGH (ref 11.5–15.5)
WBC: 9.6 10*3/uL (ref 4.0–10.5)
WBC: 15.3 10*3/uL — ABNORMAL HIGH (ref 4.0–10.5)
nRBC: 0 % (ref 0.0–0.2)
nRBC: 0 % (ref 0.0–0.2)

## 2020-04-08 LAB — GLUCOSE, CAPILLARY
Glucose-Capillary: 55 mg/dL — ABNORMAL LOW (ref 70–99)
Glucose-Capillary: 105 mg/dL — ABNORMAL HIGH (ref 70–99)
Glucose-Capillary: 57 mg/dL — ABNORMAL LOW (ref 70–99)
Glucose-Capillary: 54 mg/dL — ABNORMAL LOW (ref 70–99)
Glucose-Capillary: 67 mg/dL — ABNORMAL LOW (ref 70–99)
Glucose-Capillary: 77 mg/dL (ref 70–99)
Glucose-Capillary: 93 mg/dL (ref 70–99)
Glucose-Capillary: 78 mg/dL (ref 70–99)

## 2020-04-08 LAB — LACTIC ACID, PLASMA
Lactic Acid, Venous: 1 mmol/L (ref 0.5–1.9)
Lactic Acid, Venous: 1 mmol/L (ref 0.5–1.9)

## 2020-04-08 LAB — MRSA PCR SCREENING: MRSA by PCR: NEGATIVE

## 2020-04-08 MED ORDER — SODIUM CHLORIDE 0.9 % IV SOLN
2.0000 g | INTRAVENOUS | Status: AC
  Administered 2020-04-10 – 2020-04-12 (×2): 2 g via INTRAVENOUS
  Filled 2020-04-08 (×3): qty 2

## 2020-04-08 MED ORDER — SODIUM CHLORIDE 0.9 % IV SOLN
1.0000 g | Freq: Once | INTRAVENOUS | Status: DC
  Filled 2020-04-08: qty 1

## 2020-04-08 MED ORDER — DEXTROSE 50 % IV SOLN
12.5000 g | INTRAVENOUS | Status: AC
  Administered 2020-04-08: 12.5 g via INTRAVENOUS
  Filled 2020-04-08: qty 50

## 2020-04-08 MED ORDER — VANCOMYCIN HCL IN DEXTROSE 1-5 GM/200ML-% IV SOLN
1000.0000 mg | INTRAVENOUS | Status: AC
  Administered 2020-04-08: 1000 mg via INTRAVENOUS
  Filled 2020-04-08 (×3): qty 200

## 2020-04-08 MED ORDER — DOXERCALCIFEROL 4 MCG/2ML IV SOLN
INTRAVENOUS | Status: AC
  Administered 2020-04-08: 7 ug via INTRAVENOUS
  Filled 2020-04-08: qty 4

## 2020-04-08 MED ORDER — OXYCODONE HCL 5 MG PO TABS
ORAL_TABLET | ORAL | Status: AC
  Filled 2020-04-08: qty 1

## 2020-04-08 MED ORDER — HYDRALAZINE HCL 20 MG/ML IJ SOLN
INTRAMUSCULAR | Status: AC
  Administered 2020-04-08: 5 mg via INTRAVENOUS
  Filled 2020-04-08: qty 1

## 2020-04-08 MED ORDER — METRONIDAZOLE IN NACL 5-0.79 MG/ML-% IV SOLN
500.0000 mg | Freq: Three times a day (TID) | INTRAVENOUS | Status: AC
  Administered 2020-04-08 – 2020-04-12 (×14): 500 mg via INTRAVENOUS
  Filled 2020-04-08 (×14): qty 100

## 2020-04-08 MED ORDER — VANCOMYCIN HCL 2000 MG/400ML IV SOLN
2000.0000 mg | Freq: Once | INTRAVENOUS | Status: AC
  Administered 2020-04-08: 2000 mg via INTRAVENOUS
  Filled 2020-04-08: qty 400

## 2020-04-08 MED ORDER — INSULIN GLARGINE 100 UNIT/ML ~~LOC~~ SOLN
15.0000 [IU] | Freq: Every day | SUBCUTANEOUS | Status: DC
  Administered 2020-04-11 – 2020-04-16 (×6): 15 [IU] via SUBCUTANEOUS
  Filled 2020-04-08 (×11): qty 0.15

## 2020-04-08 MED ORDER — FUROSEMIDE 10 MG/ML IJ SOLN
80.0000 mg | Freq: Once | INTRAMUSCULAR | Status: AC
  Administered 2020-04-08: 80 mg via INTRAVENOUS
  Filled 2020-04-08: qty 8

## 2020-04-08 NOTE — Progress Notes (Signed)
Pt was educated on risk of not doing surgical interventions but still refusing and states "its in GODS hands" this AM . Pt. received back from Dialysis BP 189/93 . Temp 101.6 RR28 HR10. Pt educated on risk due to high BP but refusing all PO meds. Dr.clark notified and updated. No new orders. Pt contacts updated on condition. I called palliative and left message. No response. Pt now unable to keep eyes open and barley reacts to questions. Night shift RN notified and will continue to monitor pt.  Phoebe Sharps, RN

## 2020-04-08 NOTE — Progress Notes (Signed)
Inpatient Diabetes Program Recommendations  AACE/ADA: New Consensus Statement on Inpatient Glycemic Control   Target Ranges:  Prepandial:   less than 140 mg/dL      Peak postprandial:   less than 180 mg/dL (1-2 hours)      Critically ill patients:  140 - 180 mg/dL   Results for LORALAI, EISMAN (MRN [id number]) as of 04/08/2020 11:12  Ref. Range 04/07/2020 06:09 04/07/2020 11:04 04/07/2020 17:13 04/07/2020 21:30 04/08/2020 06:11 04/08/2020 06:33 04/08/2020 06:45  Glucose-Capillary Latest Ref Range: 70 - 99 mg/dL 123 (H) 93 78 72 55 (L) 57 (L) 105 (H)   Review of Glycemic Control  Diabetes history: DM2 Outpatient Diabetes medications: Lantus 18 units QHS, Regular 0-15 units TID with meals Current orders for Inpatient glycemic control: Lantus 18 units QHS, Novolog 0-15 units TID with meals, Novolog 0-5 units QHS  Inpatient Diabetes Program Recommendations:    Insulin-Basal: Fasting glucose 55 mg/dl today and noted to be in the 70's yesterday. Please consider decreasing Lantus to 15 units QHS.  Thanks, Barnie Alderman, RN, MSN, CDE Diabetes Coordinator Inpatient Diabetes Program [phone] (Team Pager from 8am to 5pm)

## 2020-04-08 NOTE — Progress Notes (Signed)
Physical Therapy Treatment Patient Details Name: Karen [name] MRN: [id number] DOB: Oct 01, 1975 Today's Date: 04/08/2020    History of Present Illness Pt is a 44 y.o. female, with history of end-stage renal disease on hemodialysis MWF, HTN, L BKA, PAD, ischemic cardiomyopathy, diabetes, previous stroke with some ongoing dysphagia.  She presented for evaluation of right heel wound.  Pt s/p R proximal SFA angioplasty on 04/03/20 and debridement of R heel skin and soft tissue on 04/04/20.  Per vascular surgery notes , pt remains high risk for limb loss.    PT Comments    Pt supine on arrival with eyes closed and breakfast tray present. Pt would arouse to name and initially stating she doesn't want to do anything. Pt noted to be chewing on glove and removed from pt and bed. Pt states she wants to be able to return home with 44yo but lacks motivation to move. Pt educated for benefit and need for mobility with pt ultimately participating for limited mobility. Pt reports she wears prosthesis for transfers but required mod assist to don this session with frequent pauses and blank stares during donning and assist for sitting balance. Pt requires 2 person assist to scoot to chair and is not currently safe to consider option other than SNF. Will continue to follow.   SpO2 92% on RA HR 92 BP 137/86 (99) sitting    Follow Up Recommendations  SNF;Supervision/Assistance - 24 hour     Equipment Recommendations  None recommended by PT    Recommendations for Other Services       Precautions / Restrictions Precautions Precautions: Fall Precaution Comments: L BKA, Rt heel wound    Mobility  Bed Mobility Overal bed mobility: Needs Assistance Bed Mobility: Supine to Sit     Supine to sit: Mod assist;+2 for physical assistance;HOB elevated     General bed mobility comments: physical assist to pivot hips toward EOB and fully elevate trunk with pt assiting with bil UE and rail. Mod assist for balance EOB  and to don prosthesis EOB with trunk support  Transfers Overall transfer level: Needs assistance   Transfers: Lateral/Scoot Transfers          Lateral/Scoot Transfers: Max assist;+2 physical assistance General transfer comment: max +2 assist with use of pad to perform lateral sliding transfer with drop arm chair. leg blocked, max cues for trunk positioning and assist  Ambulation/Gait             General Gait Details: unable   Stairs             Wheelchair Mobility    Modified Rankin (Stroke Patients Only)       Balance Overall balance assessment: Needs assistance   Sitting balance-Leahy Scale: Poor Sitting balance - Comments: mod assist for balance sitting EOB                                    Cognition Arousal/Alertness: Lethargic Behavior During Therapy: Flat affect Overall Cognitive Status: Impaired/Different from baseline Area of Impairment: Memory;Following commands;Awareness;Safety/judgement;Problem solving                     Memory: Decreased short-term memory Following Commands: Follows one step commands inconsistently;Follows one step commands with increased time Safety/Judgement: Decreased awareness of safety;Decreased awareness of deficits Awareness: Intellectual Problem Solving: Slow processing;Requires verbal cues;Requires tactile cues;Decreased initiation;Difficulty sequencing General Comments: pt supine on arrival chewing on  glove. Pt with very mumbled speech and reports pain talking. Pt very slow to respond to questions/ cues throughout session and multiple episodes of pt sitting with blank stare mid task and when cued would state "I'm fine" I'm not sleeping". Pt unable to don prosthesis without assist yet insistent on having it on for lateral scoot      Exercises General Exercises - Lower Extremity Long Arc Quad: AAROM;Both;10 reps;Seated    General Comments        Pertinent Vitals/Pain Pain Assessment:  Faces Pain Score: 4  Pain Location: R foot Pain Descriptors / Indicators: Discomfort Pain Intervention(s): Limited activity within patient's tolerance;Monitored during session;Repositioned    Home Living                      Prior Function            PT Goals (current goals can now be found in the care plan section) Progress towards PT goals: Progressing toward goals    Frequency    Min 2X/week      PT Plan Frequency needs to be updated;Current plan remains appropriate    Co-evaluation              AM-PAC PT "6 Clicks" Mobility   Outcome Measure  Help needed turning from your back to your side while in a flat bed without using bedrails?: A Lot Help needed moving from lying on your back to sitting on the side of a flat bed without using bedrails?: A Lot Help needed moving to and from a bed to a chair (including a wheelchair)?: Total Help needed standing up from a chair using your arms (e.g., wheelchair or bedside chair)?: Total Help needed to walk in hospital room?: Total Help needed climbing 3-5 steps with a railing? : Total 6 Click Score: 8    End of Session Equipment Utilized During Treatment: Gait belt Activity Tolerance: Patient limited by lethargy Patient left: in chair;with call bell/phone within reach;with chair alarm set Nurse Communication: Mobility status;Other (comment) (sequence for +2 lateral scoot back to bed) PT Visit Diagnosis: Muscle weakness (generalized) (M62.81);Unsteadiness on feet (R26.81);Other abnormalities of gait and mobility (R26.89)     Time: 2094-7096 PT Time Calculation (min) (ACUTE ONLY): 24 min  Charges:  $Therapeutic Activity: 23-37 mins                     Stoy Fenn P, PT Acute Rehabilitation Services Pager: [phone] Office: [place] 04/08/2020, 12:22 PM

## 2020-04-08 NOTE — Progress Notes (Addendum)
Pharmacy Antibiotic Note  Karen [name] is a 44 y.o. female admitted on 04/03/2020 with necrotic foot wound non-responding to Clindamycin and concern for evolving sepsis. Patient in need of BKA per Surgery but patient is refusing. Pharmacy has been consulted for Vancomycin and Zosyn dosing. Unfortunately patient has allergy to penicillins but has tolerated Cefepime in past). After discussion of this issue with Dagoberto Ligas, PA-C will change to Cefepime + Flagyl in place of Zosyn.   Patient has ESRD and DM, likely polymicrobial. Blood cultures sent this AM. MRSA PCR sent as well.   Tmax 101.2 overnight. WBC within normal limits. ST overnight with dips in blood pressure. Tachypneic requiring 1L [place].  Patient receives HD on Monday, Wednesday, Friday with next dialysis today.   Plan: Vancomycin 2000 mg IV x1 now, then 1000mg  IV post-HD MWF.  Cefepime 1g IV x1 now, then 2g IV post-HD MWF.  Follow-up clinical status, culture results, and hemodialysis plans.   Addendum: Due to access issues, unable to give Cefepime 1g before HD.  Will change to Cefepime 2g IV post-HD MWF - RN aware.   Height: 5\' 6"  (167.6 cm) Weight: 104.5 kg (230 lb 6.1 oz) IBW/kg (Calculated) : 59.3  Temp (24hrs), Avg:100 F (37.8 C), Min:98.4 F (36.9 C), Max:101.3 F (38.5 C)  Recent Labs  Lab 04/03/20 0911 04/03/20 2127 04/04/20 1458 04/05/20 0408 04/06/20 0449 04/07/20 0355  WBC  --  9.2 7.4 8.0 8.1 8.9  CREATININE 13.20* 12.23* 6.73* 7.40* 4.95*  --     Estimated Creatinine Clearance: 17.9 mL/min (A) (by C-G formula based on SCr of 4.95 mg/dL (H)).    Allergies  Allergen Reactions  . Baclofen Itching  . Penicillins Anaphylaxis, Hives, Rash and Other (See Comments)    PATIENT HAD A PCN REACTION WITH IMMEDIATE RASH, FACIAL/TONGUE/THROAT SWELLING, SOB, OR LIGHTHEADEDNESS WITH HYPOTENSION:  #  #  #  YES  #  #  #   SEVERE RASH INVOLVING MUCUS MEMBRANES or SKIN NECROSIS: #  #  #  YES  #  #  # Has patient had a  PCN reaction that required hospitalization No Has patient had a PCN reaction occurring within the last 10 years: No If all of the above answers are "NO", then may proceed with Cephalosporin use.  09/10/16- tolerated Cefepime   . Morphine And Related Hives and Rash  . Novolog [Insulin Aspart] Other (See Comments)    Cramps/ Gi distress    Antimicrobials this admission: 7/15 Clindamycin >> 7/19 7/19 Vancomycin >> 7/19 Cefepime >> 7/19 Flagyl >>  Dose adjustments this admission:   Microbiology results: 7/15 COVID negative 7/19 Blood >> 7/19 MRSA PCR >>  Thank you for allowing pharmacy to be a part of this patient's care.  Sloan Leiter, PharmD, BCPS, BCCCP Clinical Pharmacist Please refer to [facility] for [place] numbers 04/08/2020 7:52 AM

## 2020-04-08 NOTE — Progress Notes (Addendum)
Hypoglycemic Event  CBG: 55  Treatment:12.5g dextrose 50% solution  Symptoms: lethargy  Follow-up CBG: Time: 0645 CBG Result: 105  Possible Reasons for Event: Pt lethargic and taking in very little by mouth     Karen [name]

## 2020-04-08 NOTE — Progress Notes (Signed)
CRITICAL VALUE ALERT  Critical Value:  Hemoglobin 6.6  Date & Time Notied: 04/08/20 @1429   Provider Notified: Rodman Key PA  Orders Received/Actions taken: No new orders. 2 units of blood will be given in Dialysis   Phoebe Sharps, RN

## 2020-04-08 NOTE — Progress Notes (Signed)
Patient ID: Karen [name], female   DOB: Dec 31, 1975, 44 y.o.   MRN: [id number] [facility] Progress Note   Assessment/ Plan:   1. Critical limb ischemia with necrotic right heel ulcer status post sharp excisional debridement: Unfortunately poor prognostic markers based on exam for limb salvage however patient remains adamant that she does not want BKA at this time.  abx per primary team.  wound care per vascular surgery recommendations  2. ESRD -continue hemodialysis on a Monday/Wednesday/Friday schedule.  Ordered renal panel.  Would please choose an alternative to morphine given ESRD.  Have called HD to prioritize her treatment   3. Hypertension/volume -she appears to be close to euvolemic on physical exam and has been normotensive   4. Anemia CKD plans for PRBC's x 1 unit on 7/19. with significant inflammatory complex from nonhealing heel ulcer/ongoing bleeding). Ordered CBC   5. Metabolic bone disease -previous labs significant for elevated phosphorus level, continue binders for phosphorus lowering and continue VDRA  6. Nutrition -continue renal diet with fluid restriction   Subjective:   Tired today.  Makes some urine.  Last HD 7/16 with 2.5 kg UF  Review of systems:   States some shortness of breath  No chest pain Denies n/v    Objective:   BP (!) 149/82 (BP Location: Right Wrist)   Pulse 88   Temp 98.5 F (36.9 C) (Oral)   Resp 20   Ht 5\' 6"  (1.676 m)   Wt 104.5 kg   SpO2 97%   BMI 37.18 kg/m   Intake/Output Summary (Last 24 hours) at 04/08/2020 1317 Last data filed at 04/08/2020 1207 Gross per 24 hour  Intake 454.64 ml  Output --  Net 454.64 ml   Weight change:   Physical Exam: Gen: resting in bed - slow to respond as per prior charting CVS: Pulse regular rhythm, normal rate, S1 and S2 normal; trace edema  Resp: patient with wheezing. No rhonchi; on 1-2 liters oxygen  Abd: Soft, flat, nontender Access: Left upper arm AV graft with  bruit  Imaging: No results found.  Labs: BMET Recent Labs  Lab 04/03/20 0911 04/03/20 2127 04/04/20 1458 04/05/20 0408 04/06/20 0449  NA 137 136  --  133* 137  K 5.1 4.7  --  4.8 3.8  CL 99 97*  --  95* 98  CO2  --  20*  --  23 25  GLUCOSE 194* 189*  --  300* 138*  BUN 63* 53*  --  33* 20  CREATININE 13.20* 12.23* 6.73* 7.40* 4.95*  CALCIUM  --  8.6*  --  8.8* 8.5*  PHOS  --  9.5*  --   --   --    CBC Recent Labs  Lab 04/04/20 1458 04/05/20 0408 04/06/20 0449 04/07/20 0355  WBC 7.4 8.0 8.1 8.9  HGB 8.0* 7.8* 7.2* 6.8*  HCT 26.8* 26.2* 24.2* 22.8*  MCV 95.4 94.9 95.3 94.2  PLT 153 141* 144* 144*   Medications:    . amLODipine  10 mg Oral Daily  . aspirin EC  81 mg Oral Daily  . atorvastatin  40 mg Oral Daily  . calcitRIOL  1.5 mcg Oral Q T,Th,Sa-HD  . Chlorhexidine Gluconate Cloth  6 each Topical Q0600  . clopidogrel  300 mg Oral Once   Followed by  . clopidogrel  75 mg Oral Q breakfast  . docusate sodium  100 mg Oral Daily  . doxercalciferol  7 mcg Intravenous Q M,W,F-HD  . gabapentin  100 mg Oral QHS  . heparin injection (subcutaneous)  5,000 Units Subcutaneous Q8H  . insulin aspart  0-15 Units Subcutaneous TID WC  . insulin aspart  0-5 Units Subcutaneous QHS  . insulin glargine  15 Units Subcutaneous QHS  . methocarbamol  500 mg Oral BID  . metoprolol succinate  12.5 mg Oral Daily  . multivitamin  1 tablet Oral QHS  . pantoprazole  40 mg Oral Daily  . polyethylene glycol  17 g Oral Daily  . sevelamer carbonate  2,400 mg Oral TID WC  . sodium chloride flush  3 mL Intravenous Q12H   Claudia Desanctis, MD 04/08/2020, 1:30 PM

## 2020-04-08 NOTE — Progress Notes (Signed)
Inpatient Rehabilitation-Admissions Coordinator   CIR consult received. Met with pt bedside for rehab assessment. Limited assessment completed due to pt fatigue and wheezing (RN aware). Based on clinical picture, and PT note from today, [facility] would agree with current therapy recommendation for SNF as it does not appear like she would be able to tolerate the intensity of an IP Rehab program. [facility] will not pursue CIR at this time and will sign off.   Raechel Ache, OTR/L  Rehab Admissions Coordinator  [phone] 04/08/2020 2:07 PM

## 2020-04-08 NOTE — Progress Notes (Signed)
Pt with temperature of 101.2 at 2332 04/07/20 with yellow MEWS. PRN Tylenol given. Temp. 101.3 at [place] 04/08/20 with red MEWS. BP 116/40 (60). HR 101. RR 30. SpO2 95% on 2L [place].  Fields MD paged. Fields MD ordered blood cultures. Vital signs escalated to RED protocol. Will continue to monitor.  Adella Hare, RN

## 2020-04-08 NOTE — Progress Notes (Addendum)
  Progress Note    04/08/2020 7:57 AM 4 Days Post-Op  Subjective:  Refusing amputation.   Vitals:   04/08/20 0413 04/08/20 0513  BP: 131/69 140/70  Pulse: 92 89  Resp: (!) 22 15  Temp: 98.6 F (37 C) 98.4 F (36.9 C)  SpO2: 97% 93%   Physical Exam: Lungs:  Non labored Incisions:  Necrotic heel wound pictured below Extremities:  PT and DP by doppler Neurologic: somnolent      CBC    Component Value Date/Time   WBC 8.9 04/07/2020 0355   RBC 2.42 (L) 04/07/2020 0355   HGB 6.8 (LL) 04/07/2020 0355   HCT 22.8 (L) 04/07/2020 0355   PLT 144 (L) 04/07/2020 0355   MCV 94.2 04/07/2020 0355   MCH 28.1 04/07/2020 0355   MCHC 29.8 (L) 04/07/2020 0355   RDW 16.7 (H) 04/07/2020 0355   LYMPHSABS 1.5 04/02/2019 2243   MONOABS 0.6 04/02/2019 2243   EOSABS 0.2 04/02/2019 2243   BASOSABS 0.0 04/02/2019 2243    BMET    Component Value Date/Time   NA 137 04/06/2020 0449   K 3.8 04/06/2020 0449   CL 98 04/06/2020 0449   CO2 25 04/06/2020 0449   GLUCOSE 138 (H) 04/06/2020 0449   BUN 20 04/06/2020 0449   CREATININE 4.95 (H) 04/06/2020 0449   CALCIUM 8.5 (L) 04/06/2020 0449   GFRNONAA 10 (L) 04/06/2020 0449   GFRAA 12 (L) 04/06/2020 0449    INR    Component Value Date/Time   INR 1.15 02/08/2013 0500     Intake/Output Summary (Last 24 hours) at 04/08/2020 0757 Last data filed at 04/08/2020 0545 Gross per 24 hour  Intake 334.64 ml  Output --  Net 334.64 ml     Assessment/Plan:  44 y.o. female is s/p SFA stent and subsequent heel debridement failing to heal 4 Days Post-Op   Patient will require below the knee amputation due to nonhealing heel wound She is currently refusing amputation We will consult palliative for goals of care discussion Pharmacy also consulted to broaden antibiotics while we are discussing goals of care   Dagoberto Ligas, PA-C [facility] [phone] 04/08/2020 7:57 AM  I have seen and evaluated the patient. I agree  with the PA note as documented above.  44 year old female with ESRD that initially presented to clinic with large right heel wound.  I initially recommended right below-knee amputation given I did not think the limb would be salvageable.  She adamantly refused this and we attempted revascularization with SFA angioplasty and subsequent debridement of the heel and I told her still high risk for limb loss.  This is all necrotic again and I think she needs a BKA at this time.  She had a fever overnight to 101.2.  She is adamantly refusing amputation and states it is in God's hands.  I will broaden out her antibiotics to Vanc Zosyn after talking to pharmacy.  Consult palliative care because I think she is going to be at high risk for systemic sepsis and death without amputation.  Marty Heck, MD [facility] [place] Office: [phone]

## 2020-04-09 DIAGNOSIS — I739 Peripheral vascular disease, unspecified: Secondary | ICD-10-CM

## 2020-04-09 DIAGNOSIS — Z7189 Other specified counseling: Secondary | ICD-10-CM

## 2020-04-09 DIAGNOSIS — Z515 Encounter for palliative care: Secondary | ICD-10-CM

## 2020-04-09 DIAGNOSIS — R531 Weakness: Secondary | ICD-10-CM

## 2020-04-09 DIAGNOSIS — Z992 Dependence on renal dialysis: Secondary | ICD-10-CM

## 2020-04-09 DIAGNOSIS — N186 End stage renal disease: Secondary | ICD-10-CM

## 2020-04-09 LAB — GLUCOSE, CAPILLARY
Glucose-Capillary: 87 mg/dL (ref 70–99)
Glucose-Capillary: 63 mg/dL — ABNORMAL LOW (ref 70–99)
Glucose-Capillary: 74 mg/dL (ref 70–99)
Glucose-Capillary: 102 mg/dL — ABNORMAL HIGH (ref 70–99)
Glucose-Capillary: 67 mg/dL — ABNORMAL LOW (ref 70–99)
Glucose-Capillary: 77 mg/dL (ref 70–99)
Glucose-Capillary: 69 mg/dL — ABNORMAL LOW (ref 70–99)
Glucose-Capillary: 73 mg/dL (ref 70–99)

## 2020-04-09 LAB — TYPE AND SCREEN
ABO/RH(D): O POS
Antibody Screen: POSITIVE
DAT, IgG: POSITIVE
Unit division: 0
Unit division: 0

## 2020-04-09 LAB — RENAL FUNCTION PANEL
Albumin: 2.4 g/dL — ABNORMAL LOW (ref 3.5–5.0)
Anion gap: 13 (ref 5–15)
BUN: 25 mg/dL — ABNORMAL HIGH (ref 6–20)
CO2: 27 mmol/L (ref 22–32)
Calcium: 8.7 mg/dL — ABNORMAL LOW (ref 8.9–10.3)
Chloride: 96 mmol/L — ABNORMAL LOW (ref 98–111)
Creatinine, Ser: 6.51 mg/dL — ABNORMAL HIGH (ref 0.44–1.00)
GFR calc Af Amer: 8 mL/min — ABNORMAL LOW (ref >60)
GFR calc non Af Amer: 7 mL/min — ABNORMAL LOW (ref >60)
Glucose, Bld: 90 mg/dL (ref 70–99)
Phosphorus: 5.9 mg/dL — ABNORMAL HIGH (ref 2.5–4.6)
Potassium: 4.3 mmol/L (ref 3.5–5.1)
Sodium: 136 mmol/L (ref 135–145)

## 2020-04-09 LAB — BPAM RBC
Blood Product Expiration Date: 202108162359
Blood Product Expiration Date: 202108162359
ISSUE DATE / TIME: 202107191450
ISSUE DATE / TIME: 202107191450
Unit Type and Rh: 5100
Unit Type and Rh: 5100

## 2020-04-09 MED ORDER — CHLORHEXIDINE GLUCONATE CLOTH 2 % EX PADS
6.0000 | MEDICATED_PAD | Freq: Every day | CUTANEOUS | Status: DC
  Administered 2020-04-11 – 2020-04-16 (×3): 6 via TOPICAL

## 2020-04-09 MED ORDER — HYDROMORPHONE HCL 1 MG/ML IJ SOLN
1.0000 mg | INTRAMUSCULAR | Status: DC | PRN
  Administered 2020-04-09 – 2020-04-15 (×14): 1 mg via INTRAVENOUS
  Filled 2020-04-09 (×14): qty 1

## 2020-04-09 NOTE — TOC Progression Note (Addendum)
Transition of Care [facility]) - Progression Note    Patient Details  Name: Karen [name] MRN: [id number] Date of Birth: 02-Nov-1975  Transition of Care [facility]) CM/SW [place], [place] Phone Number: 04/09/2020, 9:30 AM  Clinical Narrative:    10:00a CSW contacted by Arbie Cookey with [facility]. SNF will review referral for possible placement.   9:30a CSW contacted [facility] for follow-up on referral and left a voicemail with Judeen Hammans in admissions, awaiting a call back. CSW contacted [facility] and left a voicemail with Arbie Cookey in admissions, awaiting a call back.   Expected Discharge Plan: Skilled Nursing Facility Barriers to Discharge: Continued Medical Work up, SNF Pending bed offer  Expected Discharge Plan and Services Expected Discharge Plan: [place]   Discharge Planning Services: CM Consult                                           Social Determinants of Health (SDOH) Interventions    Readmission Risk Interventions Readmission Risk Prevention Plan 04/04/2020  Social Work Consult for [place] Planning/Counseling Complete  Medication Review Press photographer) Complete  Some recent data might be hidden

## 2020-04-09 NOTE — Care Management Important Message (Signed)
Important Message  Patient Details  Name: Karen [name] MRN: [id number] Date of Birth: 08/03/1976   Medicare Important Message Given:  Yes     Shelda Altes 04/09/2020, 10:46 AM

## 2020-04-09 NOTE — Progress Notes (Signed)
Hypoglycemic Event  CBG: 67  Treatment: 4 oz soda  Symptoms: Asymptomatic  Follow-up CBG: Time: 1220 CBG Result: 102  Possible Reasons for Event: Pt not eating well.  [name]

## 2020-04-09 NOTE — Progress Notes (Addendum)
Patient underwent palliative care consultation and in review of her options, she has now consented to proceed with right below the knee amputation.  Her pastor, Roque Cash, is at bedside.  She is awake and alert.  Discussed the procedure and the usual postoperative course.  Their questions were answered to the best my ability regarding postoperative rehabilitation.  The rehab admissions coordinator reviewed the case and feels that a skilled nursing facility would be her best option postoperatively versus inpatient rehab program.  I reviewed the notes from the transition of care social worker with the patient and her pastor.  We will schedule right below the knee amputation with Dr. Donnetta Hutching tomorrow.  N.p.o. after midnight

## 2020-04-09 NOTE — Progress Notes (Signed)
Inpatient Diabetes Program Recommendations  AACE/ADA: New Consensus Statement on Inpatient Glycemic Control   Target Ranges:  Prepandial:   less than 140 mg/dL      Peak postprandial:   less than 180 mg/dL (1-2 hours)      Critically ill patients:  140 - 180 mg/dL  Results for Karen [name], Karen [name] (MRN [id number]) as of 04/09/2020 09:19  Ref. Range 04/09/2020 06:21 04/09/2020 06:51  Glucose-Capillary Latest Ref Range: 70 - 99 mg/dL 63 (L) 74   Results for Karen [name], Karen [name] (MRN [id number]) as of 04/09/2020 09:19  Ref. Range 04/08/2020 06:33 04/08/2020 06:45 04/08/2020 11:56 04/08/2020 12:43 04/08/2020 13:51 04/08/2020 17:17 04/08/2020 18:11 04/08/2020 21:50  Glucose-Capillary Latest Ref Range: 70 - 99 mg/dL 57 (L) 105 (H) 54 (L) 67 (L) 77 93 87 78   Review of Glycemic Control  Diabetes history: DM2 Outpatient Diabetes medications: Lantus 18 units QHS, Regular 0-15 units TID with meals Current orders for Inpatient glycemic control: Lantus 15 units QHS, Novolog 0-15 units TID with meals, Novolog 0-5 units QHS  Inpatient Diabetes Program Recommendations:    Insulin-Basal: Fasting glucose 63 mg/dl today. Per chart, patient did NOT receive Lantus last night.  Please consider discontinuing Lantus for now and consider restarting at a lower dose if glucose becomes consistently elevated.  Insulin-Correction: If patient is not eating well, please consider changing Novolog correction to 0-9 units and change frequency to Q4H.  Thanks, Barnie Alderman, RN, MSN, CDE Diabetes Coordinator Inpatient Diabetes Program [phone] (Team Pager from 8am to 5pm)

## 2020-04-09 NOTE — Consult Note (Signed)
Consultation Note Date: 04/09/2020   Patient Name: Karen [name]  DOB: Mar 13, 1976  MRN: [id number]  Age / Sex: 44 y.o., female  PCP: Patient, No Pcp Per Referring Physician: Marty Heck, MD  Reason for Consultation: Establishing goals of care and Psychosocial/spiritual support  HPI/Patient Profile: 44 y.o. female   admitted on 04/03/2020 with past medical history significant for end-stage renal disease on hemodialysis, ischemic cardiomyopathy, diabetes, history of CVA, hypertension, left BKA, PAD, nonhealing right heel ulcer.  Admitted per vascular surgery for necrotic right heel.  Patient has adamantly refused recommended amputation in the past, she is status post S half a angioplasty and debridement, unfortunately the heel became necrotic again. Recommendation continues to be amputation per vascular surgery.  Today patient is open to conversation and consideration of amputation.  Patient faces treatment option decisions, advanced directive decisions and anticipatory care needs.   Clinical Assessment and Goals of Care:   This NP Wadie Lessen reviewed medical records, received report from team, assessed the patient and then meet at the patient's bedside along with her pastor and friend/ Karen [name] to discuss diagnosis, prognosis, [place], EOL wishes disposition and options.   Concept of Palliative Care was introduced as specialized medical care for people and their families living with serious illness.  If focuses on providing relief from the symptoms and stress of a serious illness.  The goal is to improve quality of life for both the patient and the family.  Education was offered today regarding the patient's multiple comorbidities and its impact on her long-term prognosis.  Education specific to long-term effect of diabetes on her vascular system and increased risk of non healing LE  ulcers.  Education/conversation had regarding the possible outcomes of both agreeing to surgical amputation versus a more palliative comfort path.     A  discussion was had today regarding advanced directives.  Concepts specific to code status, artifical feeding and hydration, continued IV antibiotics and rehospitalization was had.  The difference between a aggressive medical intervention path  and a palliative comfort care path for this patient at this time was had.  Values and goals of care important to patient and family were attempted to be elicited.   MOST form introduced   Natural trajectory and expectations at EOL were discussed.  Questions and concerns addressed.  Patient  encouraged to call with questions or concerns.     PMT will continue to support holistically.         Patient is alert and oriented and has made decision to make her pastor/friend Karen [name] her H POA.  He is in agreement.  Blue book discussed and spiritual care to help notarized     SUMMARY OF RECOMMENDATIONS    Code Status/Advance Care Planning:  Full code  Encouraged patient/family to consider DNR/DNI status understanding evidenced based poor outcomes in similar hospitalized patient, as the cause of arrest is likely associated with advanced chronic illness rather than an easily reversible acute cardio-pulmonary event.   Palliative Prophylaxis:   Aspiration,  Bowel Regimen, Delirium Protocol, Frequent Pain Assessment and Oral Care  Additional Recommendations (Limitations, Scope, Preferences):  Full Scope Treatment   Patient has made decision to move forward with amputation and wishes to speak to vascular surgeon.  Psycho-social/Spiritual:   Desire for further Chaplaincy support:no-patient denied hospital chaplain she has strong community church support.   Prognosis:   Unable to determine  Discharge Planning: Likely will need SNF for rehabilitation  To Be Determined      Primary  Diagnoses: Present on Admission: . PAD (peripheral artery disease) ([place])   I have reviewed the medical record, interviewed the patient and family, and examined the patient. The following aspects are pertinent.  Past Medical History:  Diagnosis Date  . Anemia   . Anxiety   . Dyspnea    "when I dont go to dialysis"  . Eczema   . ESRD (end stage renal disease) ([place])    Hemo - [facility]- [place]  . Exertional shortness of breath    "recently; it's fluid" (02/02/2013)  . GERD (gastroesophageal reflux disease)   . Headache   . History of blood transfusion    "last week" (02/02/2013)  . History of kidney stones    passed  . Hypertension   . Ischemic cardiomyopathy    by echo 2014  . Neuropathy   . Osteomyelitis of toe of left foot ([place])    "off and on since 2009; no OR" (02/02/2013)  . Pneumonia 09/2016  . Type II diabetes mellitus ([place]) 1995   Social History   Socioeconomic History  . Marital status: Single    Spouse name: Not on file  . Number of children: Not on file  . Years of education: Not on file  . Highest education level: Not on file  Occupational History  . Not on file  Tobacco Use  . Smoking status: Former Smoker    Packs/day: 3.00    Years: 10.00    Pack years: 30.00    Types: Cigarettes    Quit date: 09/21/2004    Years since quitting: 15.5  . Smokeless tobacco: Never Used  Vaping Use  . Vaping Use: Never used  Substance and Sexual Activity  . Alcohol use: No  . Drug use: No  . Sexual activity: Not Currently    Birth control/protection: None  Other Topics Concern  . Not on file  Social History Narrative  . Not on file   Social Determinants of Health   Financial Resource Strain:   . Difficulty of Paying Living Expenses:   Food Insecurity:   . Worried About Charity fundraiser in the Last Year:   . Arboriculturist in the Last Year:   Transportation Needs:   . Film/video editor (Medical):   Marland Kitchen Lack of Transportation (Non-Medical):   Physical  Activity:   . Days of Exercise per Week:   . Minutes of Exercise per Session:   Stress:   . Feeling of Stress :   Social Connections:   . Frequency of Communication with Friends and Family:   . Frequency of Social Gatherings with Friends and Family:   . Attends Religious Services:   . Active Member of Clubs or Organizations:   . Attends Archivist Meetings:   Marland Kitchen Marital Status:    Family History  Problem Relation Age of Onset  . Hypertension Mother   . Diabetes Mother   . Diabetes Father    Scheduled Meds: . amLODipine  10 mg Oral Daily  .  aspirin EC  81 mg Oral Daily  . atorvastatin  40 mg Oral Daily  . calcitRIOL  1.5 mcg Oral Q T,Th,Sa-HD  . Chlorhexidine Gluconate Cloth  6 each Topical Q0600  . clopidogrel  300 mg Oral Once   Followed by  . clopidogrel  75 mg Oral Q breakfast  . docusate sodium  100 mg Oral Daily  . doxercalciferol  7 mcg Intravenous Q M,W,F-HD  . gabapentin  100 mg Oral QHS  . heparin injection (subcutaneous)  5,000 Units Subcutaneous Q8H  . insulin aspart  0-15 Units Subcutaneous TID WC  . insulin aspart  0-5 Units Subcutaneous QHS  . insulin glargine  15 Units Subcutaneous QHS  . methocarbamol  500 mg Oral BID  . metoprolol succinate  12.5 mg Oral Daily  . multivitamin  1 tablet Oral QHS  . pantoprazole  40 mg Oral Daily  . polyethylene glycol  17 g Oral Daily  . sevelamer carbonate  2,400 mg Oral TID WC  . sodium chloride flush  3 mL Intravenous Q12H   Continuous Infusions: . sodium chloride    . sodium chloride    . sodium chloride    . [START ON 04/10/2020] ceFEPime (MAXIPIME) IV    . ferric gluconate (FERRLECIT/NULECIT) IV    . magnesium sulfate bolus IVPB    . metronidazole 500 mg (04/09/20 0845)  . vancomycin Stopped (04/08/20 1916)   PRN Meds:.sodium chloride, sodium chloride, sodium chloride, acetaminophen **OR** acetaminophen, diphenhydrAMINE, guaiFENesin-dextromethorphan, heparin, hydrALAZINE, HYDROmorphone (DILAUDID)  injection, labetalol, lidocaine (PF), lidocaine-prilocaine, magnesium sulfate bolus IVPB, metoprolol tartrate, ondansetron (ZOFRAN) IV, ondansetron, oxyCODONE, pentafluoroprop-tetrafluoroeth, phenol, senna-docusate, sodium chloride flush Medications Prior to Admission:  Prior to Admission medications   Medication Sig Start Date End Date Taking? Authorizing Provider  amLODipine (NORVASC) 10 MG tablet Take 10 mg by mouth daily.   Yes [provider]  aspirin EC 81 MG tablet Take 81 mg by mouth daily. Swallow whole.   Yes [provider]  atorvastatin (LIPITOR) 40 MG tablet Take 40 mg by mouth daily.  06/08/19  Yes [provider]  calcitRIOL (ROCALTROL) 0.5 MCG capsule Take 3 capsules (1.5 mcg total) by mouth Every Tuesday,Thursday,and Saturday with dialysis. Patient taking differently: Take 1.5 mcg by mouth every Monday, Wednesday, and Friday.  11/13/17  Yes Debbe Odea, MD  calcium carbonate (OS-CAL) 1250 (500 Ca) MG chewable tablet Chew 1 tablet by mouth 3 (three) times daily.  01/14/16  Yes [provider]  cinacalcet (SENSIPAR) 30 MG tablet Take 1 tablet (30 mg total) by mouth Every Tuesday,Thursday,and Saturday with dialysis. Patient taking differently: Take 30 mg by mouth every Monday, Wednesday, and Friday with hemodialysis.  11/13/17  Yes Debbe Odea, MD  ciprofloxacin (CIPRO) 250 MG tablet Take 250 mg by mouth daily. 03/27/20  Yes [provider]  clindamycin (CLEOCIN) 150 MG capsule Take 450 mg by mouth 3 (three) times daily. 03/27/20  Yes [provider]  dexamethasone (DECADRON) 6 MG tablet Take 6 mg by mouth daily. 01/24/20  Yes [provider]  ferrous sulfate 325 (65 FE) MG tablet Take 325 mg by mouth daily with breakfast.  01/24/20  Yes [provider]  gabapentin (NEURONTIN) 100 MG capsule Take 100 mg by mouth every evening.  01/01/20  Yes [provider]  hydrALAZINE (APRESOLINE) 25 MG tablet Take 25 mg by mouth  2 (two) times daily.  06/08/19  Yes [provider]  insulin glargine (LANTUS) 100 UNIT/ML injection Inject 18 Units into the  skin at bedtime. 01/14/16  Yes [provider]  insulin regular (HUMULIN R) 100 units/mL injection Inject 0-15 Units into the skin See admin instructions. Sliding Scale 10/30/19  Yes [provider]  lidocaine-prilocaine (EMLA) cream Apply 1 application topically as directed.  05/02/19  Yes [provider]  methocarbamol (ROBAXIN) 500 MG tablet Take 500 mg by mouth 2 (two) times daily.  01/15/20  Yes [provider]  metoprolol succinate (TOPROL-XL) 25 MG 24 hr tablet Take 0.5 tablets (12.5 mg total) by mouth daily. 07/10/18 02/20/28 Yes Bonnielee Haff, MD  multivitamin (RENA-VIT) TABS tablet Take 1 tablet by mouth at bedtime. 11/13/17  Yes Debbe Odea, MD  naloxone [facility]) 4 MG/0.1ML LIQD nasal spray kit Place 0.4 mg into the nose once. Repeat after 3 minutes if no or minimal response 01/18/20  Yes [provider]  ondansetron (ZOFRAN) 4 MG tablet Take 4 mg by mouth daily as needed for nausea or vomiting.   Yes [provider]  oxyCODONE (OXY IR/ROXICODONE) 5 MG immediate release tablet Take 5 mg by mouth every 4 (four) hours as needed for moderate pain.  10/25/18  Yes [provider]  pantoprazole (PROTONIX) 20 MG tablet TAKE 1 TABLET(20 MG) BY MOUTH EVERY DAY Patient taking differently: Take 20 mg by mouth daily.  02/06/20  Yes Lillard Anes, MD  polyethylene glycol [facility] / Floria Raveling) packet Take 17 g by mouth daily. 02/02/18  Yes Bonnita Hollow, MD  sevelamer carbonate (RENVELA) 800 MG tablet Take 800 mg by mouth 3 (three) times daily with meals.   Yes [provider]  aspirin 325 MG tablet Take 1 tablet (325 mg total) by mouth daily. Patient not taking: Reported on 04/04/2020 09/16/17   Melanee Spry, MD  Darbepoetin Alfa (ARANESP) 200 MCG/0.4ML SOSY injection Inject 0.4 mLs (200 mcg  total) into the vein every Thursday with hemodialysis. Patient not taking: Reported on 07/09/2018 02/03/18   Bonnita Hollow, MD  ferric gluconate 62.5 mg in sodium chloride 0.9 % 100 mL Inject 62.5 mg into the vein every Thursday with hemodialysis. Patient not taking: Reported on 07/09/2018 02/03/18   Bonnita Hollow, MD  Methoxy PEG-Epoetin Beta (MIRCERA IJ) Mircera 02/07/20 02/05/21  [provider]  Sherron Flemings X 6 MM MISC  02/01/20   [provider]   Allergies  Allergen Reactions  . Baclofen Itching  . Penicillins Anaphylaxis, Hives, Rash and Other (See Comments)    PATIENT HAD A PCN REACTION WITH IMMEDIATE RASH, FACIAL/TONGUE/THROAT SWELLING, SOB, OR LIGHTHEADEDNESS WITH HYPOTENSION:  #  #  #  YES  #  #  #   SEVERE RASH INVOLVING MUCUS MEMBRANES or SKIN NECROSIS: #  #  #  YES  #  #  # Has patient had a PCN reaction that required hospitalization No Has patient had a PCN reaction occurring within the last 10 years: No If all of the above answers are "NO", then may proceed with Cephalosporin use.  09/10/16- tolerated Cefepime   . Morphine And Related Hives and Rash  . Novolog [Insulin Aspart] Other (See Comments)    Cramps/ Gi distress   Review of Systems  Neurological: Positive for weakness.    Physical Exam Constitutional:      Appearance: She is overweight. She is ill-appearing.  Cardiovascular:     Rate and Rhythm: Normal rate.  Pulmonary:     Effort: Pulmonary effort is normal.  Musculoskeletal:     Comments: Noted left below the knee  amputation Noted per EMR right heel non-healing wound  Skin:    General: Skin is warm and dry.  Neurological:     Mental Status: She is alert and oriented to person, place, and time.     Vital Signs: BP (!) 146/81 (BP Location: Right Wrist)   Pulse 93   Temp 99 F (37.2 C) (Oral)   Resp 20   Ht '5\' 6"'  (1.676 m)   Wt 100.3 kg   SpO2 93%   BMI 35.69 kg/m  Pain Scale: 0-10 POSS *See Group Information*:  S-Acceptable,Sleep, easy to arouse Pain Score: Asleep   SpO2: SpO2: 93 % O2 Device:SpO2: 93 % O2 Flow Rate: .O2 Flow Rate (L/min): 1 L/min  IO: Intake/output summary:   Intake/Output Summary (Last 24 hours) at 04/09/2020 1351 Last data filed at 04/09/2020 0200 Gross per 24 hour  Intake 1000 ml  Output 2400 ml  Net -1400 ml    LBM: Last BM Date: 04/05/20 Baseline Weight: Weight: 94.8 kg Most recent weight: Weight: 100.3 kg     Palliative Assessment/Data: 30 % at best   Discussed with Dr. Carlis Abbott via secure chat  Time In: 1445 Time Out: 1600 Time Total: 75 minutes Greater than 50%  of this time was spent counseling and coordinating care related to the above assessment and plan.  Signed by: Wadie Lessen, NP   Please contact Palliative Medicine Team phone at [phone] for questions and concerns.  For individual provider: See Shea Evans

## 2020-04-09 NOTE — Progress Notes (Signed)
Patient ID: Karen [name], female   DOB: 1976/01/28, 44 y.o.   MRN: [id number] [facility] Progress Note   Assessment/ Plan:   1. Critical limb ischemia with necrotic right heel ulcer status post sharp excisional debridement: Unfortunately poor prognostic markers based on exam for limb salvage however patient remains adamant that she does not want BKA at this time.  abx per primary team.  wound care per vascular surgery recommendations  2. ESRD -continue hemodialysis on a Monday/Wednesday/Friday schedule.  Ordered renal panel.  Would please avoid to morphine given ESRD.    3. Hypertension/volume acceptable on current regimen; optimize with HD   4. Anemia CKD got PRBC x 2 units on 7/19 with HD.  with significant inflammatory complex from nonhealing heel ulcer/ongoing bleeding).   5. Metabolic bone disease - hyperphos - continue binders and continue VDRA  6. Nutrition -continue renal diet with fluid restriction   Subjective:   Last HD on 7/19 with 2.4 kg UF.  Oxygen at 1 liter flowing into face on arrival - adjusted during my exam.   Review of systems:  States some shortness of breath but better No chest pain Denies n/v    Objective:   BP (!) 149/81 (BP Location: Right Wrist)   Pulse 94   Temp 100 F (37.8 C) (Oral)   Resp 16   Ht 5\' 6"  (1.676 m)   Wt 100.3 kg   SpO2 100%   BMI 35.69 kg/m   Intake/Output Summary (Last 24 hours) at 04/09/2020 1054 Last data filed at 04/09/2020 0200 Gross per 24 hour  Intake 1180 ml  Output 2400 ml  Net -1220 ml   Weight change: -1.6 kg  Physical Exam: Gen: resting in bed in NAD CVS: S1 and S2 normal; trace edema  Resp: clear but diminished breaths; unlabored.  No rhonchi; on 1 liter oxygen  Abd: Soft, nontender obese habitus  Neuro slow to respond as per prior charting but does converse Access: Left upper arm AV graft with bruit  Imaging: No results found.  Labs: BMET Recent Labs  Lab 04/03/20 0911 04/03/20 2127  04/04/20 1458 04/05/20 0408 04/06/20 0449 04/08/20 1405  NA 137 136  --  133* 137 136  K 5.1 4.7  --  4.8 3.8 4.7  CL 99 97*  --  95* 98 96*  CO2  --  20*  --  23 25 24   GLUCOSE 194* 189*  --  300* 138* 92  BUN 63* 53*  --  33* 20 38*  CREATININE 13.20* 12.23* 6.73* 7.40* 4.95* 8.05*  CALCIUM  --  8.6*  --  8.8* 8.5* 8.5*  PHOS  --  9.5*  --   --   --  6.6*   CBC Recent Labs  Lab 04/06/20 0449 04/07/20 0355 04/08/20 1406 04/08/20 2102  WBC 8.1 8.9 9.6 15.3*  HGB 7.2* 6.8* 6.6* 9.3*  HCT 24.2* 22.8* 22.5* 29.8*  MCV 95.3 94.2 94.1 90.0  PLT 144* 144* 148* 111*   Medications:    . amLODipine  10 mg Oral Daily  . aspirin EC  81 mg Oral Daily  . atorvastatin  40 mg Oral Daily  . calcitRIOL  1.5 mcg Oral Q T,Th,Sa-HD  . Chlorhexidine Gluconate Cloth  6 each Topical Q0600  . clopidogrel  300 mg Oral Once   Followed by  . clopidogrel  75 mg Oral Q breakfast  . docusate sodium  100 mg Oral Daily  . doxercalciferol  7 mcg Intravenous  Q M,W,F-HD  . gabapentin  100 mg Oral QHS  . heparin injection (subcutaneous)  5,000 Units Subcutaneous Q8H  . insulin aspart  0-15 Units Subcutaneous TID WC  . insulin aspart  0-5 Units Subcutaneous QHS  . insulin glargine  15 Units Subcutaneous QHS  . methocarbamol  500 mg Oral BID  . metoprolol succinate  12.5 mg Oral Daily  . multivitamin  1 tablet Oral QHS  . pantoprazole  40 mg Oral Daily  . polyethylene glycol  17 g Oral Daily  . sevelamer carbonate  2,400 mg Oral TID WC  . sodium chloride flush  3 mL Intravenous Q12H   Claudia Desanctis, MD 04/09/2020, 10:54 AM

## 2020-04-09 NOTE — Progress Notes (Addendum)
Progress Note    04/09/2020 7:39 AM 5 Days Post-Op  Subjective: Complaining of right lower extremity pain.  Continues to refuse amputation.  She dialyzed yesterday via left upper arm AV fistula.   Vitals:   04/08/20 2323 04/09/20 0449  BP: (!) 137/36 (!) 127/59  Pulse: (!) 102 95  Resp: (!) 25 17  Temp: 100.3 F (37.9 C) (!) 100.8 F (38.2 C)  SpO2: 98% 94%    Physical Exam: Cardiac: Rate and rhythm are regular Lungs: To auscultation bilaterally Extremities: Right foot dressing is dry and intact.  Positive bruit left upper arm AV fistula  CBC    Component Value Date/Time   WBC 15.3 (H) 04/08/2020 2102   RBC 3.31 (L) 04/08/2020 2102   HGB 9.3 (L) 04/08/2020 2102   HCT 29.8 (L) 04/08/2020 2102   PLT 111 (L) 04/08/2020 2102   MCV 90.0 04/08/2020 2102   MCH 28.1 04/08/2020 2102   MCHC 31.2 04/08/2020 2102   RDW 16.6 (H) 04/08/2020 2102   LYMPHSABS 1.5 04/02/2019 2243   MONOABS 0.6 04/02/2019 2243   EOSABS 0.2 04/02/2019 2243   BASOSABS 0.0 04/02/2019 2243    BMET    Component Value Date/Time   NA 136 04/08/2020 1405   K 4.7 04/08/2020 1405   CL 96 (L) 04/08/2020 1405   CO2 24 04/08/2020 1405   GLUCOSE 92 04/08/2020 1405   BUN 38 (H) 04/08/2020 1405   CREATININE 8.05 (H) 04/08/2020 1405   CALCIUM 8.5 (L) 04/08/2020 1405   GFRNONAA 6 (L) 04/08/2020 1405   GFRAA 6 (L) 04/08/2020 1405     Intake/Output Summary (Last 24 hours) at 04/09/2020 0739 Last data filed at 04/09/2020 0200 Gross per 24 hour  Intake 1180 ml  Output 2400 ml  Net -1220 ml    HOSPITAL MEDICATIONS Scheduled Meds: . amLODipine  10 mg Oral Daily  . aspirin EC  81 mg Oral Daily  . atorvastatin  40 mg Oral Daily  . calcitRIOL  1.5 mcg Oral Q T,Th,Sa-HD  . Chlorhexidine Gluconate Cloth  6 each Topical Q0600  . clopidogrel  300 mg Oral Once   Followed by  . clopidogrel  75 mg Oral Q breakfast  . docusate sodium  100 mg Oral Daily  . doxercalciferol  7 mcg Intravenous Q M,W,F-HD  .  gabapentin  100 mg Oral QHS  . heparin injection (subcutaneous)  5,000 Units Subcutaneous Q8H  . insulin aspart  0-15 Units Subcutaneous TID WC  . insulin aspart  0-5 Units Subcutaneous QHS  . insulin glargine  15 Units Subcutaneous QHS  . methocarbamol  500 mg Oral BID  . metoprolol succinate  12.5 mg Oral Daily  . multivitamin  1 tablet Oral QHS  . pantoprazole  40 mg Oral Daily  . polyethylene glycol  17 g Oral Daily  . sevelamer carbonate  2,400 mg Oral TID WC  . sodium chloride flush  3 mL Intravenous Q12H   Continuous Infusions: . sodium chloride    . sodium chloride    . sodium chloride    . [START ON 04/10/2020] ceFEPime (MAXIPIME) IV    . ferric gluconate (FERRLECIT/NULECIT) IV    . magnesium sulfate bolus IVPB    . metronidazole Stopped (04/09/20 0045)  . vancomycin Stopped (04/08/20 1916)   PRN Meds:.sodium chloride, sodium chloride, sodium chloride, acetaminophen **OR** acetaminophen, diphenhydrAMINE, guaiFENesin-dextromethorphan, heparin, hydrALAZINE, labetalol, lidocaine (PF), lidocaine-prilocaine, magnesium sulfate bolus IVPB, metoprolol tartrate, morphine injection, ondansetron (ZOFRAN) IV, ondansetron, oxyCODONE, pentafluoroprop-tetrafluoroeth, phenol, senna-docusate,  sodium chloride flush  Assessment: She is requesting something for pain.  As per Dr. Rubye Beach note, recommend alternative to morphine.     Plan: -We will discontinue IV morphine and substitute Dilaudid. -DVT prophylaxis: Heparin subcu   Risa Grill, PA-C [facility] [phone] 04/09/2020  7:39 AM   I have seen and evaluated the patient. I agree with the PA note as documented above.  44 year old female who was initially seen in clinic with a large necrotic right heel wound.  I initially recommended BKA and patient adamantly refused.  I then offered revascularization with attempted heel debridement and discussed still high risk for limb loss and unlikely to get any soft tissue  coverage.  Even after SFA angioplasty and debridement of her heel her heel has immediately become necrotic again.  I think she needs right leg amputation for source control.  She still adamantly refusing amputation and states it is in gods hands.  She has been febrile again overnight and we did broaden out her antibiotics to Vanco and Zosyn yesterday.  Even in clinic prior to her surgery she adamantly refused amputation and discussed with my partner Dr. Oneida Alar who saw her over the weekend and she was very clear that she was refusing amputation.  I have asked palliative care to assist given this is a very difficult situation about how we move forward.  Marty Heck, MD [facility] [place] Office: [phone]

## 2020-04-10 ENCOUNTER — Encounter (HOSPITAL_COMMUNITY): Payer: Self-pay | Admitting: Vascular Surgery

## 2020-04-10 ENCOUNTER — Encounter (HOSPITAL_COMMUNITY)
Admission: RE | Disposition: A | Discharge status: Skilled Nursing Facility | Payer: Self-pay | Source: Home / Self Care | Attending: Vascular Surgery

## 2020-04-10 ENCOUNTER — Inpatient Hospital Stay (HOSPITAL_COMMUNITY): Payer: Medicare Other | Admitting: Anesthesiology

## 2020-04-10 DIAGNOSIS — I70234 Atherosclerosis of native arteries of right leg with ulceration of heel and midfoot: Secondary | ICD-10-CM | POA: Diagnosis not present

## 2020-04-10 HISTORY — PX: AMPUTATION: SHX166

## 2020-04-10 LAB — GLUCOSE, CAPILLARY
Glucose-Capillary: 88 mg/dL (ref 70–99)
Glucose-Capillary: 73 mg/dL (ref 70–99)
Glucose-Capillary: 72 mg/dL (ref 70–99)
Glucose-Capillary: 123 mg/dL — ABNORMAL HIGH (ref 70–99)
Glucose-Capillary: 108 mg/dL — ABNORMAL HIGH (ref 70–99)

## 2020-04-10 LAB — RENAL FUNCTION PANEL
Albumin: 2.6 g/dL — ABNORMAL LOW (ref 3.5–5.0)
Anion gap: 15 (ref 5–15)
BUN: 31 mg/dL — ABNORMAL HIGH (ref 6–20)
CO2: 24 mmol/L (ref 22–32)
Calcium: 9.1 mg/dL (ref 8.9–10.3)
Chloride: 96 mmol/L — ABNORMAL LOW (ref 98–111)
Creatinine, Ser: 7.91 mg/dL — ABNORMAL HIGH (ref 0.44–1.00)
GFR calc Af Amer: 7 mL/min — ABNORMAL LOW (ref >60)
GFR calc non Af Amer: 6 mL/min — ABNORMAL LOW (ref >60)
Glucose, Bld: 120 mg/dL — ABNORMAL HIGH (ref 70–99)
Phosphorus: 6.9 mg/dL — ABNORMAL HIGH (ref 2.5–4.6)
Potassium: 4.2 mmol/L (ref 3.5–5.1)
Sodium: 135 mmol/L (ref 135–145)

## 2020-04-10 LAB — CBC
HCT: 30.3 % — ABNORMAL LOW (ref 36.0–46.0)
Hemoglobin: 9.3 g/dL — ABNORMAL LOW (ref 12.0–15.0)
MCH: 28.7 pg (ref 26.0–34.0)
MCHC: 30.7 g/dL (ref 30.0–36.0)
MCV: 93.5 fL (ref 80.0–100.0)
Platelets: 198 10*3/uL (ref 150–400)
RBC: 3.24 MIL/uL — ABNORMAL LOW (ref 3.87–5.11)
RDW: 16.4 % — ABNORMAL HIGH (ref 11.5–15.5)
WBC: 13.7 10*3/uL — ABNORMAL HIGH (ref 4.0–10.5)
nRBC: 0 % (ref 0.0–0.2)

## 2020-04-10 SURGERY — AMPUTATION BELOW KNEE
Anesthesia: General | Site: Knee | Laterality: Right

## 2020-04-10 MED ORDER — ONDANSETRON HCL 4 MG/2ML IJ SOLN
INTRAMUSCULAR | Status: DC | PRN
  Administered 2020-04-10: 4 mg via INTRAVENOUS

## 2020-04-10 MED ORDER — 0.9 % SODIUM CHLORIDE (POUR BTL) OPTIME
TOPICAL | Status: DC | PRN
  Administered 2020-04-10: 1000 mL

## 2020-04-10 MED ORDER — FENTANYL CITRATE (PF) 100 MCG/2ML IJ SOLN
25.0000 ug | INTRAMUSCULAR | Status: DC | PRN
  Administered 2020-04-10 (×2): 25 ug via INTRAVENOUS
  Administered 2020-04-10: 50 ug via INTRAVENOUS

## 2020-04-10 MED ORDER — FENTANYL CITRATE (PF) 100 MCG/2ML IJ SOLN
INTRAMUSCULAR | Status: AC
  Filled 2020-04-10: qty 2

## 2020-04-10 MED ORDER — DARBEPOETIN ALFA 150 MCG/0.3ML IJ SOSY
150.0000 ug | PREFILLED_SYRINGE | INTRAMUSCULAR | Status: DC
  Filled 2020-04-10: qty 0.3

## 2020-04-10 MED ORDER — PROMETHAZINE HCL 25 MG/ML IJ SOLN: 6.2500 mg | INTRAMUSCULAR | Status: DC | PRN

## 2020-04-10 MED ORDER — OXYCODONE HCL 5 MG PO TABS
5.0000 mg | ORAL_TABLET | Freq: Once | ORAL | Status: AC | PRN
  Administered 2020-04-10: 5 mg via ORAL

## 2020-04-10 MED ORDER — CHLORHEXIDINE GLUCONATE 0.12 % MT SOLN
OROMUCOSAL | Status: AC
  Filled 2020-04-10: qty 15

## 2020-04-10 MED ORDER — DARBEPOETIN ALFA 150 MCG/0.3ML IJ SOSY
PREFILLED_SYRINGE | INTRAMUSCULAR | Status: AC
  Administered 2020-04-10: 150 ug via INTRAVENOUS
  Filled 2020-04-10: qty 0.3

## 2020-04-10 MED ORDER — DOXERCALCIFEROL 4 MCG/2ML IV SOLN
INTRAVENOUS | Status: AC
  Administered 2020-04-10: 7 ug via INTRAVENOUS
  Filled 2020-04-10: qty 4

## 2020-04-10 MED ORDER — OXYCODONE HCL 5 MG/5ML PO SOLN: 5.0000 mg | Freq: Once | ORAL | Status: AC | PRN

## 2020-04-10 MED ORDER — FENTANYL CITRATE (PF) 100 MCG/2ML IJ SOLN
INTRAMUSCULAR | Status: DC | PRN
  Administered 2020-04-10 (×2): 50 ug via INTRAVENOUS

## 2020-04-10 MED ORDER — FENTANYL CITRATE (PF) 250 MCG/5ML IJ SOLN
INTRAMUSCULAR | Status: AC
  Filled 2020-04-10: qty 5

## 2020-04-10 MED ORDER — MIDAZOLAM HCL 2 MG/2ML IJ SOLN
INTRAMUSCULAR | Status: AC
  Filled 2020-04-10: qty 2

## 2020-04-10 MED ORDER — SODIUM CHLORIDE 0.9 % IV SOLN: INTRAVENOUS | Status: DC

## 2020-04-10 MED ORDER — CHLORHEXIDINE GLUCONATE 0.12 % MT SOLN: 15.0000 mL | Freq: Once | OROMUCOSAL | Status: DC

## 2020-04-10 MED ORDER — PHENYLEPHRINE HCL-NACL 10-0.9 MG/250ML-% IV SOLN
INTRAVENOUS | Status: DC | PRN
Start: 2020-04-10 — End: 2020-04-10
  Administered 2020-04-10: 15 ug/min via INTRAVENOUS

## 2020-04-10 MED ORDER — PROPOFOL 10 MG/ML IV BOLUS
INTRAVENOUS | Status: DC | PRN
  Administered 2020-04-10: 120 mg via INTRAVENOUS

## 2020-04-10 MED ORDER — OXYCODONE HCL 5 MG PO TABS
ORAL_TABLET | ORAL | Status: AC
  Administered 2020-04-10: 5 mg via ORAL
  Filled 2020-04-10: qty 1

## 2020-04-10 MED ORDER — VANCOMYCIN HCL IN DEXTROSE 1-5 GM/200ML-% IV SOLN
INTRAVENOUS | Status: AC
  Administered 2020-04-10: 1000 mg via INTRAVENOUS
  Filled 2020-04-10: qty 200

## 2020-04-10 MED ORDER — ACETAMINOPHEN 500 MG PO TABS
1000.0000 mg | ORAL_TABLET | Freq: Once | ORAL | Status: DC
  Filled 2020-04-10: qty 2

## 2020-04-10 MED ORDER — DEXTROSE 50 % IV SOLN
INTRAVENOUS | Status: AC
  Administered 2020-04-10: 25 mL
  Filled 2020-04-10: qty 50

## 2020-04-10 MED ORDER — DEXMEDETOMIDINE (PRECEDEX) IN NS 20 MCG/5ML (4 MCG/ML) IV SYRINGE
PREFILLED_SYRINGE | INTRAVENOUS | Status: DC | PRN
  Administered 2020-04-10: 20 ug via INTRAVENOUS

## 2020-04-10 MED ORDER — ALBUMIN HUMAN 5 % IV SOLN: INTRAVENOUS | Status: DC | PRN

## 2020-04-10 MED ORDER — OXYCODONE HCL 5 MG PO TABS
ORAL_TABLET | ORAL | Status: AC
  Filled 2020-04-10: qty 1

## 2020-04-10 MED ORDER — LIDOCAINE 2% (20 MG/ML) 5 ML SYRINGE
INTRAMUSCULAR | Status: DC | PRN
  Administered 2020-04-10: 100 mg via INTRAVENOUS

## 2020-04-10 MED ORDER — DEXMEDETOMIDINE (PRECEDEX) IN NS 20 MCG/5ML (4 MCG/ML) IV SYRINGE
PREFILLED_SYRINGE | INTRAVENOUS | Status: AC
Start: 2020-04-10 — End: ?
  Filled 2020-04-10: qty 5

## 2020-04-10 SURGICAL SUPPLY — 55 items
BANDAGE ESMARK 6X9 LF (GAUZE/BANDAGES/DRESSINGS) IMPLANT
BLADE SAW GIGLI 510 (BLADE) ×2 IMPLANT
BLADE SAW GIGLI 510MM (BLADE) ×1
BNDG ELASTIC 4X5.8 VLCR STR LF (GAUZE/BANDAGES/DRESSINGS) ×3 IMPLANT
BNDG ELASTIC 6X5.8 VLCR STR LF (GAUZE/BANDAGES/DRESSINGS) IMPLANT
BNDG ESMARK 6X9 LF (GAUZE/BANDAGES/DRESSINGS) ×3
BNDG GAUZE ELAST 4 BULKY (GAUZE/BANDAGES/DRESSINGS) ×2 IMPLANT
CANISTER SUCT 3000ML PPV (MISCELLANEOUS) ×3 IMPLANT
CLIP LIGATING EXTRA MED SLVR (CLIP) ×1 IMPLANT
CLIP LIGATING EXTRA SM BLUE (MISCELLANEOUS) ×1 IMPLANT
CLIP VESOCCLUDE MED 6/CT (CLIP) ×2 IMPLANT
COVER SURGICAL LIGHT HANDLE (MISCELLANEOUS) ×4 IMPLANT
COVER WAND RF STERILE (DRAPES) ×1 IMPLANT
CUFF TOURN SGL QUICK 34 (TOURNIQUET CUFF) ×2
CUFF TOURN SGL QUICK 42 (TOURNIQUET CUFF) IMPLANT
CUFF TRNQT CYL 34X4.125X (TOURNIQUET CUFF) IMPLANT
DRAIN SNY 10X20 3/4 PERF (WOUND CARE) IMPLANT
DRAPE HALF SHEET 40X57 (DRAPES) ×3 IMPLANT
DRAPE ORTHO SPLIT 77X108 STRL (DRAPES) ×4
DRAPE SURG ORHT 6 SPLT 77X108 (DRAPES) ×2 IMPLANT
DRSG ADAPTIC 3X8 NADH LF (GAUZE/BANDAGES/DRESSINGS) ×2 IMPLANT
ELECT REM PT RETURN 9FT ADLT (ELECTROSURGICAL) ×3
ELECTRODE REM PT RTRN 9FT ADLT (ELECTROSURGICAL) ×1 IMPLANT
EVACUATOR SILICONE 100CC (DRAIN) IMPLANT
GAUZE SPONGE 4X4 12PLY STRL (GAUZE/BANDAGES/DRESSINGS) ×3 IMPLANT
GAUZE SPONGE 4X4 12PLY STRL LF (GAUZE/BANDAGES/DRESSINGS) ×2 IMPLANT
GAUZE XEROFORM 5X9 LF (GAUZE/BANDAGES/DRESSINGS) ×1 IMPLANT
GLOVE SS BIOGEL STRL SZ 7.5 (GLOVE) ×1 IMPLANT
GLOVE SUPERSENSE BIOGEL SZ 7.5 (GLOVE)
GOWN STRL REUS W/ TWL LRG LVL3 (GOWN DISPOSABLE) ×3 IMPLANT
GOWN STRL REUS W/TWL LRG LVL3 (GOWN DISPOSABLE) ×6
KIT BASIN OR (CUSTOM PROCEDURE TRAY) ×3 IMPLANT
KIT TURNOVER KIT B (KITS) ×3 IMPLANT
NS IRRIG 1000ML POUR BTL (IV SOLUTION) ×3 IMPLANT
PACK GENERAL/GYN (CUSTOM PROCEDURE TRAY) ×3 IMPLANT
PAD ARMBOARD 7.5X6 YLW CONV (MISCELLANEOUS) ×6 IMPLANT
PADDING CAST COTTON 6X4 STRL (CAST SUPPLIES) ×2 IMPLANT
PENCIL BUTTON HOLSTER BLD 10FT (ELECTRODE) IMPLANT
STAPLER VISISTAT 35W (STAPLE) ×5 IMPLANT
STOCKINETTE IMPERVIOUS LG (DRAPES) ×3 IMPLANT
SUT ETHILON 3 0 PS 1 (SUTURE) ×2 IMPLANT
SUT SILK 2 0 (SUTURE) ×2
SUT SILK 2 0 SH CR/8 (SUTURE) ×2 IMPLANT
SUT SILK 2-0 18XBRD TIE 12 (SUTURE) IMPLANT
SUT SILK 3 0 (SUTURE) ×2
SUT SILK 3-0 18XBRD TIE 12 (SUTURE) IMPLANT
SUT SILK 4 0 (SUTURE)
SUT SILK 4-0 18XBRD TIE 12 (SUTURE) IMPLANT
SUT VIC AB 0 CT1 18XCR BRD 8 (SUTURE) ×2 IMPLANT
SUT VIC AB 0 CT1 8-18 (SUTURE)
SUT VIC AB 2-0 CT1 18 (SUTURE) ×8 IMPLANT
SUT VICRYL AB 2 0 TIES (SUTURE) ×1 IMPLANT
TOWEL GREEN STERILE (TOWEL DISPOSABLE) ×6 IMPLANT
UNDERPAD 30X36 HEAVY ABSORB (UNDERPADS AND DIAPERS) ×5 IMPLANT
WATER STERILE IRR 1000ML POUR (IV SOLUTION) ×3 IMPLANT

## 2020-04-10 NOTE — Anesthesia Postprocedure Evaluation (Signed)
Anesthesia Post Note  Patient: Karen [name]  Procedure(s) Performed: RIGHT AMPUTATION BELOW KNEE (Right Knee)     Patient location during evaluation: PACU Anesthesia Type: General Level of consciousness: sedated Pain management: pain level controlled Vital Signs Assessment: post-procedure vital signs reviewed and stable Respiratory status: spontaneous breathing and respiratory function stable Cardiovascular status: stable Postop Assessment: no apparent nausea or vomiting Anesthetic complications: no   No complications documented.  Last Vitals:  Vitals:   04/10/20 1145 04/10/20 1156  BP:  (!) 132/94  Pulse: 94 90  Resp: (!) 23 16  Temp:    SpO2: (!) 88% 99%    Last Pain:  Vitals:   04/10/20 0800  TempSrc:   PainSc: Asleep                 [name]

## 2020-04-10 NOTE — Transfer of Care (Signed)
Immediate Anesthesia Transfer of Care Note  Patient: Karen [name]  Procedure(s) Performed: RIGHT AMPUTATION BELOW KNEE (Right Knee)  Patient Location: PACU  Anesthesia Type:General  Level of Consciousness: drowsy and patient cooperative  Airway & Oxygen Therapy: Patient Spontanous Breathing and Patient connected to face mask oxygen  Post-op Assessment: Report given to RN and Post -op Vital signs reviewed and stable  Post vital signs: Reviewed and stable  Last Vitals:  Vitals Value Taken Time  BP 172/87 04/10/20 1123  Temp    Pulse 95 04/10/20 1125  Resp 24 04/10/20 1125  SpO2 97 % 04/10/20 1125  Vitals shown include unvalidated device data.  Last Pain:  Vitals:   04/10/20 0800  TempSrc:   PainSc: Asleep      Patients Stated Pain Goal: 0 (22/63/33 5456)  Complications: No complications documented.

## 2020-04-10 NOTE — Interval H&P Note (Signed)
History and Physical Interval Note:  04/10/2020 10:23 AM  Karen [name]  has presented today for surgery, with the diagnosis of GANGRENE.  The various methods of treatment have been discussed with the patient and family. After consideration of risks, benefits and other options for treatment, the patient has consented to  Procedure(s): AMPUTATION BELOW KNEE (Right) as a surgical intervention.  The patient's history has been reviewed, patient examined, no change in status, stable for surgery.  I have reviewed the patient's chart and labs.  Questions were answered to the patient's satisfaction.     Servando Snare

## 2020-04-10 NOTE — Op Note (Signed)
    Patient name: Karen [name] MRN: [id number] DOB: Nov 10, 1975 Sex: female  04/10/2020 Pre-operative Diagnosis: Necrotic right heel Post-operative diagnosis:  Same Surgeon:  Erlene Quan C. Donzetta Matters, MD Assistant: Risa Grill, PA Procedure Performed: Right below-knee amputation  Indications: 44 year old female presented with a right necrotic heel wound and underwent SFA angioplasty.  She subsequently underwent heel debridement.  This is failed to heal she is now indicated for below-knee amputation on the right.  She has a previous left lower extremity below-knee amputation.  An assistant was necessary for this procedure for suction, retraction, suture ligation of the vessels and closure of the wound.  Findings: All of her vessels were heavily calcified. All tissue appeared viable and flaps were approximated without undue tension   Procedure:  The patient was identified in the holding area and taken to the operating room where she is placed supine on the operative table general anesthesia was induced. She was sterilely prepped and draped in the right lower extremity usual fashion antibiotics were administered a timeout was called. We began with marking out a two thirds 1/3 type amputation below the knee. We also tried to avoid as many of her skin lesions as possible. We then exsanguinated the right lower extremity with Esmarch. Tourniquet was inflated 250 mmHg. We traced her skin marked with 10 blade. We dissected down to the level of bone with cautery. We elevated the periosteum off the tibia this was transected with Gigli. We transected the fibula with bone cutter. Posterior flap was created with amputation knife. All vessels were clamped. Tourniquet was allowed down total tourniquet time was 7 minutes. We suture-ligated all of the vessels individually. The nerve was pulled on tension and tied with Vicryl suture and divided. We irrigated the wound obtain hemostasis meticulously. We smoothed the bones with  rasp. Flaps were approximated with interrupted 2-0 Vicryl. Staples were placed to the level of the skin. She was awakened anesthesia having tolerated procedure without any complication. All counts were correct at completion.   EBL: 300cc  Jasmond River C. Donzetta Matters, MD [facility] Office: [phone] Pager: [phone]

## 2020-04-10 NOTE — Anesthesia Preprocedure Evaluation (Addendum)
Anesthesia Evaluation  Patient identified by MRN, date of birth, ID band Patient awake    Reviewed: Allergy & Precautions, NPO status , Patient's Chart, lab work & pertinent test results  History of Anesthesia Complications Negative for: history of anesthetic complications  Airway Mallampati: II  TM Distance: >3 FB Neck ROM: Full    Dental  (+) Dental Advisory Given, Poor Dentition, Chipped, Missing   Pulmonary former smoker,    Pulmonary exam normal        Cardiovascular hypertension, + Peripheral Vascular Disease and +CHF   Rhythm:Regular Rate:Tachycardia  TTE 2018: EF 35-40%, diffuse hypokinesis, grade 3 diastolic dysfunction, mild MR, mild LAE, PASP mildly increased 32 mmHg     Neuro/Psych Anxiety Depression CVA    GI/Hepatic Neg liver ROS, GERD  ,  Endo/Other  diabetes, Type 2  Renal/GU ESRF and DialysisRenal disease (HD T/Th/Sa)  negative genitourinary   Musculoskeletal  (+) Arthritis , S/P left BKA   Abdominal   Peds  Hematology Hgb 9.3   Anesthesia Other Findings Day of surgery medications reviewed with patient.  Reproductive/Obstetrics negative OB ROS                           Anesthesia Physical Anesthesia Plan  ASA: IV  Anesthesia Plan: General   Post-op Pain Management: GA combined w/ Regional for post-op pain   Induction: Intravenous  PONV Risk Score and Plan: 3 and Treatment may vary due to age or medical condition, Midazolam, Ondansetron and Dexamethasone  Airway Management Planned: LMA  Additional Equipment: None  Intra-op Plan:   Post-operative Plan: Extubation in OR  Informed Consent: I have reviewed the patients History and Physical, chart, labs and discussed the procedure including the risks, benefits and alternatives for the proposed anesthesia with the patient or authorized representative who has indicated his/her understanding and acceptance.      Dental advisory given  Plan Discussed with: CRNA and Anesthesiologist  Anesthesia Plan Comments:        Anesthesia Quick Evaluation

## 2020-04-10 NOTE — TOC Progression Note (Signed)
Transition of Care [facility]) - Progression Note    Patient Details  Name: Karen [name] MRN: [id number] Date of Birth: Nov 22, 1975  Transition of Care [facility]) CM/SW [place], [place] Phone Number: 04/10/2020, 12:54 PM  Clinical Narrative:     [facility] and [facility] declined- patient has no ned offers.  Thurmond Butts, MSW, [place] Clinical Social Worker   Expected Discharge Plan: Skilled Nursing Facility Barriers to Discharge: Continued Medical Work up, SNF Pending bed offer  Expected Discharge Plan and Services Expected Discharge Plan: [place]   Discharge Planning Services: CM Consult                                           Social Determinants of Health (SDOH) Interventions    Readmission Risk Interventions Readmission Risk Prevention Plan 04/04/2020  Social Work Consult for [place] Planning/Counseling Complete  Medication Review Press photographer) Complete  Some recent data might be hidden

## 2020-04-10 NOTE — Interval H&P Note (Signed)
History and Physical Interval Note:  04/10/2020 9:40 AM  Karen [name]  has presented today for surgery, with the diagnosis of GANGRENE.  The various methods of treatment have been discussed with the patient and family. After consideration of risks, benefits and other options for treatment, the patient has consented to  Procedure(s): AMPUTATION BELOW KNEE (Right) as a surgical intervention.  The patient's history has been reviewed, patient examined, no change in status, stable for surgery.  I have reviewed the patient's chart and labs.  Questions were answered to the patient's satisfaction.     Servando Snare

## 2020-04-10 NOTE — Progress Notes (Signed)
SLP Cancellation Note  Patient Details Name: Karen [name] MRN: [id number] DOB: 1976-07-23   Cancelled treatment:       Reason Eval/Treat Not Completed: Patient at procedure or test/unavailable (Pt off the unit at this time. SLP will follow up. )  Veneda Kirksey I. Hardin Negus, [place], [place] Office number [phone] Pager [place] 04/10/2020, 10:06 AM

## 2020-04-10 NOTE — Progress Notes (Signed)
Patient ID: Karen [name], female   DOB: 1975/11/08, 44 y.o.   MRN: [id number] [facility] Progress Note   Assessment/ Plan:   1. Critical limb ischemia with necrotic right heel ulcer status post sharp excisional debridement: Unfortunately poor prognostic markers based on exam for limb salvage.  She is to have amputation 7/21 with vascular.  abx per primary team.  wound care per vascular surgery recommendations  2. ESRD -continue hemodialysis on a Monday/Wednesday/Friday schedule.  Would please avoid to morphine given ESRD.    3. Hypertension/volume continue current regimen; optimize volume with HD   4. Anemia CKD got PRBC x 2 units on 7/19 with HD.  with significant inflammatory complex from nonhealing heel ulcer/ongoing bleeding).  Note mircera 150 mcg every 2 weeks - last outpt dose 7/2.  Start aranesp 150 mcg weekly on Wed on 7/21    5. Metabolic bone disease - hyperphos - continue binders and continue VDRA    6. Nutrition -continue renal diet with fluid restriction when taking PO   Subjective:   Last HD on 7/19 with 2.4 kg UF.  Oxygen around her chin on arrival with RN - she adjusted.  She is more awake and is able to tell me that she is to have an amputation today  Review of systems:  Denies shortness of breath  No chest pain Denies n/v Has been NPO for surgery     Objective:   BP (!) 145/69 (BP Location: Right Wrist)    Pulse 97    Temp 99.4 F (37.4 C) (Oral)    Resp 20    Ht 5\' 6"  (1.676 m)    Wt 100.3 kg    SpO2 92%    BMI 35.69 kg/m   Intake/Output Summary (Last 24 hours) at 04/10/2020 0851 Last data filed at 04/10/2020 0129 Gross per 24 hour  Intake 350 ml  Output --  Net 350 ml   Weight change:   Physical Exam: Gen: resting in bed in NAD CVS: S1 and S2 normal; trace edema  Resp: clear but diminished breaths; infrequent wheeze.  unlabored.  No rhonchi; on 2 liter oxygen  Abd: Soft, nontender obese habitus  Neuro slow to respond as per prior  charting but does converse; more awake pysch no anxiety or agitation  Access: Left upper arm AV graft with bruit  Imaging: No results found.  Labs: BMET Recent Labs  Lab 04/03/20 0911 04/03/20 0911 04/03/20 2127 04/04/20 1458 04/05/20 0408 04/06/20 0449 04/08/20 1405 04/09/20 1342 04/10/20 0435  NA 137  --  136  --  133* 137 136 136 135  K 5.1  --  4.7  --  4.8 3.8 4.7 4.3 4.2  CL 99  --  97*  --  95* 98 96* 96* 96*  CO2  --   --  20*  --  23 25 24 27 24   GLUCOSE 194*  --  189*  --  300* 138* 92 90 120*  BUN 63*  --  53*  --  33* 20 38* 25* 31*  CREATININE 13.20*   < > 12.23* 6.73* 7.40* 4.95* 8.05* 6.51* 7.91*  CALCIUM  --   --  8.6*  --  8.8* 8.5* 8.5* 8.7* 9.1  PHOS  --   --  9.5*  --   --   --  6.6* 5.9* 6.9*   < > = values in this interval not displayed.   CBC Recent Labs  Lab 04/07/20 0355 04/08/20 1406 04/08/20  2102 04/10/20 0435  WBC 8.9 9.6 15.3* 13.7*  HGB 6.8* 6.6* 9.3* 9.3*  HCT 22.8* 22.5* 29.8* 30.3*  MCV 94.2 94.1 90.0 93.5  PLT 144* 148* 111* 198   Medications:     amLODipine  10 mg Oral Daily   aspirin EC  81 mg Oral Daily   atorvastatin  40 mg Oral Daily   calcitRIOL  1.5 mcg Oral Q T,Th,Sa-HD   Chlorhexidine Gluconate Cloth  6 each Topical Q0600   Chlorhexidine Gluconate Cloth  6 each Topical Q0600   clopidogrel  300 mg Oral Once   Followed by   clopidogrel  75 mg Oral Q breakfast   docusate sodium  100 mg Oral Daily   doxercalciferol  7 mcg Intravenous Q M,W,F-HD   gabapentin  100 mg Oral QHS   heparin injection (subcutaneous)  5,000 Units Subcutaneous Q8H   insulin aspart  0-15 Units Subcutaneous TID WC   insulin aspart  0-5 Units Subcutaneous QHS   insulin glargine  15 Units Subcutaneous QHS   methocarbamol  500 mg Oral BID   metoprolol succinate  12.5 mg Oral Daily   multivitamin  1 tablet Oral QHS   pantoprazole  40 mg Oral Daily   polyethylene glycol  17 g Oral Daily   sevelamer carbonate  2,400 mg Oral  TID WC   sodium chloride flush  3 mL Intravenous Q12H   Claudia Desanctis, MD 04/10/2020, 9:01 AM

## 2020-04-10 NOTE — Anesthesia Procedure Notes (Signed)
Procedure Name: LMA Insertion Date/Time: 04/10/2020 10:22 AM Performed by: Lowella Dell, CRNA Pre-anesthesia Checklist: Patient identified, Emergency Drugs available, Suction available and Patient being monitored Patient Re-evaluated:Patient Re-evaluated prior to induction Oxygen Delivery Method: Circle System Utilized Preoxygenation: Pre-oxygenation with 100% oxygen Induction Type: IV induction Ventilation: Mask ventilation without difficulty LMA: LMA inserted LMA Size: 4.0 Number of attempts: 1 Airway Equipment and Method: Bite block Placement Confirmation: positive ETCO2 Tube secured with: Tape Dental Injury: Teeth and Oropharynx as per pre-operative assessment

## 2020-04-10 NOTE — H&P (View-Only) (Signed)
[facility]  Subjective  -met with her pastor and palliative care yesterday and now amendable to right leg amputation.   Objective (!) 145/69 97 99.4 F (37.4 C) (Oral) 20 92%  Intake/Output Summary (Last 24 hours) at 04/10/2020 0827 Last data filed at 04/10/2020 0129 Gross per 24 hour  Intake 350 ml  Output --  Net 350 ml    Right heel necrotic after revascularization and heel debridement  Laboratory Lab Results: Recent Labs    04/08/20 2102 04/10/20 0435  WBC 15.3* 13.7*  HGB 9.3* 9.3*  HCT 29.8* 30.3*  PLT 111* 198   BMET Recent Labs    04/09/20 1342 04/10/20 0435  NA 136 135  K 4.3 4.2  CL 96* 96*  CO2 27 24  GLUCOSE 90 120*  BUN 25* 31*  CREATININE 6.51* 7.91*  CALCIUM 8.7* 9.1    COAG Lab Results  Component Value Date   INR 1.15 02/08/2013   No results found for: PTT  Assessment/Planning:  44-year-old female who was initially seen in clinic with a large necrotic right heel wound.  I initially recommended BKA and patient adamantly refused.  I then offered revascularization with attempted heel debridement and discussed still high risk for limb loss and unlikely to get any soft tissue coverage.  Even after SFA angioplasty and debridement of her heel her heel has immediately become necrotic again.   She is now amendable to right BKA after discussion with palliative care and her pastor.  Plan right BKA today with Dr. Early.  Continue to be n.p.o.  Continue broadened antibiotics with IV vanc/zosyn due to ongoing fevers.    J  04/10/2020 8:27 AM --   

## 2020-04-10 NOTE — Progress Notes (Signed)
[facility]  Subjective  -met with her pastor and palliative care yesterday and now amendable to right leg amputation.   Objective (!) 145/69 97 99.4 F (37.4 C) (Oral) 20 92%  Intake/Output Summary (Last 24 hours) at 04/10/2020 0827 Last data filed at 04/10/2020 0129 Gross per 24 hour  Intake 350 ml  Output --  Net 350 ml    Right heel necrotic after revascularization and heel debridement  Laboratory Lab Results: Recent Labs    04/08/20 2102 04/10/20 0435  WBC 15.3* 13.7*  HGB 9.3* 9.3*  HCT 29.8* 30.3*  PLT 111* 198   BMET Recent Labs    04/09/20 1342 04/10/20 0435  NA 136 135  K 4.3 4.2  CL 96* 96*  CO2 27 24  GLUCOSE 90 120*  BUN 25* 31*  CREATININE 6.51* 7.91*  CALCIUM 8.7* 9.1    COAG Lab Results  Component Value Date   INR 1.15 02/08/2013   No results found for: PTT  Assessment/Planning:  44-year-old female who was initially seen in clinic with a large necrotic right heel wound.  I initially recommended BKA and patient adamantly refused.  I then offered revascularization with attempted heel debridement and discussed still high risk for limb loss and unlikely to get any soft tissue coverage.  Even after SFA angioplasty and debridement of her heel her heel has immediately become necrotic again.   She is now amendable to right BKA after discussion with palliative care and her pastor.  Plan right BKA today with Dr. Early.  Continue to be n.p.o.  Continue broadened antibiotics with IV vanc/zosyn due to ongoing fevers.   Kennadie Brenner J Sulo Janczak 04/10/2020 8:27 AM --   

## 2020-04-10 NOTE — Progress Notes (Signed)
SLP Cancellation Note  Patient Details Name: Vernal Rutan [place] MRN: [id number] DOB: 02/25/1976   Cancelled treatment:       Reason Eval/Treat Not Completed: Patient at procedure or test/unavailable (Pt off unit for HD. SLP will re-attempt on subsequent date unless schedule otherwise permits.)  Jaycee Pelzer I. Hardin Negus, [place], [place] Office number [phone] Pager [place] 04/10/2020, 2:15 PM

## 2020-04-10 NOTE — Progress Notes (Signed)
Pt received from PACU. R BKA dressing clean, dry and intact. VSS. Call bell in reach. Will continue to monitor.  [name], RN

## 2020-04-10 NOTE — Progress Notes (Signed)
OT Cancellation Note  Patient Details Name: Karen [name] MRN: [id number] DOB: 05-13-1976   Cancelled Treatment:    Reason Eval/Treat Not Completed: Patient at procedure or test/ unavailable (in OR for amputation)  Jeri Modena 04/10/2020, 10:39 AM   Fleeta Emmer, OTR/L  Acute Rehabilitation Services Pager: [phone] Office: [phone] .

## 2020-04-10 NOTE — Procedures (Signed)
Seen and examined on dialysis at 16:35 pm.  Blood pressure 103/68 and HR 85.  Tolerating goal. Left AV graft in use.    Claudia Desanctis, MD 04/10/2020 4:41 PM

## 2020-04-11 ENCOUNTER — Encounter (HOSPITAL_COMMUNITY): Payer: Self-pay | Admitting: Vascular Surgery

## 2020-04-11 ENCOUNTER — Ambulatory Visit: Payer: Medicare Other | Admitting: Sports Medicine

## 2020-04-11 DIAGNOSIS — M79604 Pain in right leg: Secondary | ICD-10-CM

## 2020-04-11 DIAGNOSIS — Z89511 Acquired absence of right leg below knee: Secondary | ICD-10-CM

## 2020-04-11 LAB — GLUCOSE, CAPILLARY
Glucose-Capillary: 129 mg/dL — ABNORMAL HIGH (ref 70–99)
Glucose-Capillary: 99 mg/dL (ref 70–99)
Glucose-Capillary: 107 mg/dL — ABNORMAL HIGH (ref 70–99)
Glucose-Capillary: 110 mg/dL — ABNORMAL HIGH (ref 70–99)
Glucose-Capillary: 86 mg/dL (ref 70–99)

## 2020-04-11 LAB — RENAL FUNCTION PANEL
Albumin: 2.4 g/dL — ABNORMAL LOW (ref 3.5–5.0)
Anion gap: 13 (ref 5–15)
BUN: 16 mg/dL (ref 6–20)
CO2: 26 mmol/L (ref 22–32)
Calcium: 8.9 mg/dL (ref 8.9–10.3)
Chloride: 98 mmol/L (ref 98–111)
Creatinine, Ser: 4.84 mg/dL — ABNORMAL HIGH (ref 0.44–1.00)
GFR calc Af Amer: 12 mL/min — ABNORMAL LOW (ref >60)
GFR calc non Af Amer: 10 mL/min — ABNORMAL LOW (ref >60)
Glucose, Bld: 95 mg/dL (ref 70–99)
Phosphorus: 5 mg/dL — ABNORMAL HIGH (ref 2.5–4.6)
Potassium: 3.9 mmol/L (ref 3.5–5.1)
Sodium: 137 mmol/L (ref 135–145)

## 2020-04-11 LAB — SURGICAL PATHOLOGY

## 2020-04-11 MED ORDER — ACETAMINOPHEN 325 MG PO TABS
650.0000 mg | ORAL_TABLET | Freq: Three times a day (TID) | ORAL | Status: DC
  Administered 2020-04-13 – 2020-04-16 (×4): 650 mg via ORAL
  Filled 2020-04-11 (×12): qty 2

## 2020-04-11 NOTE — Progress Notes (Signed)
Physical Therapy Treatment Patient Details Name: Karen [name] MRN: [id number] DOB: 04-13-1976 Today's Date: 04/11/2020    History of Present Illness Pt is a 44 y.o. female admitted 04/03/20 with right heel wound s/p R proximal SFA angioplasty on 7/14 and debridement of R heel 7/15. S/p R BKA 7/21. 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   PT Comments    Pt now s/p R BKA. Pt with continued lethargy this session, having difficulty following commands and staying on task. MaxA+2 to [facility] for bed mobility. Able to tolerate prolonged sitting EOB activity, including BLE AROM/strengthening. Remains limited by generalized weakness, decreased activity tolerance, poor balance, and impaired cognition. Will require maximove lift for OOB with nursing staff. Continue to recommend SNF-level therapies to maximize functional mobility and independence.  SpO2 >/92% on 2L O2     Follow Up Recommendations  SNF;Supervision/Assistance - 24 hour     Equipment Recommendations  None recommended by PT    Recommendations for Other Services       Precautions / Restrictions Precautions Precautions: Fall;Other (comment) Precaution Comments: Bilateral BKA (LLE prosthetic in room) Restrictions Weight Bearing Restrictions: Yes RLE Weight Bearing: Non weight bearing    Mobility  Bed Mobility Overal bed mobility: Needs Assistance Bed Mobility: Supine to Sit;Sit to Supine     Supine to sit: Max assist;+2 for physical assistance;HOB elevated Sit to supine: Max assist;+2 for physical assistance   General bed mobility comments: Cues for sequencing and for attention to task. Assist for advancement of bilateral residual limbs toward EOB, hand placement, and use of chuck pad; pt providing UE assist on rail with cues.  Transfers Overall transfer level: Needs assistance               General transfer comment: Deferred secondary to fatigue and  lethargy sitting EOB  Ambulation/Gait                 Stairs             Wheelchair Mobility    Modified Rankin (Stroke Patients Only)       Balance Overall balance assessment: Needs assistance Sitting-balance support: Bilateral upper extremity supported Sitting balance-Leahy Scale: Poor Sitting balance - Comments: Min guard at best and Max A at most with fatigue to maintain static sitting balance at EOB.                                     Cognition Arousal/Alertness: Lethargic;Suspect due to medications Behavior During Therapy: Flat affect Overall Cognitive Status: No family/caregiver present to determine baseline cognitive functioning Area of Impairment: Memory;Following commands;Awareness;Safety/judgement;Problem solving;Attention;Orientation                 Orientation Level: Disoriented to;Situation Current Attention Level: Sustained Memory: Decreased short-term memory Following Commands: Follows one step commands inconsistently;Follows one step commands with increased time Safety/Judgement: Decreased awareness of safety;Decreased awareness of deficits Awareness: Intellectual Problem Solving: Slow processing;Requires verbal cues;Requires tactile cues;Decreased initiation;Difficulty sequencing General Comments: Pt. very lethargic throughout session requiring repeat verbal commands for participation. Unable to accurately assess coordination, vision, and strength 2/2 lethargy limiting command following.      Exercises Other Exercises Other Exercises: AROM bilateral knee flex/ext, hip flex; PROM knee extension with stretch hold    General Comments General comments (skin integrity, edema, etc.): Pt. reports receiving pain medication prior to PT/OT  session. Pt. very lethargic with repeat reminders to keep eyes open.      Pertinent Vitals/Pain Pain Assessment: Faces Faces Pain Scale: Hurts little more Pain Location: R residual limb  with movement  Pain Descriptors / Indicators: Discomfort;Grimacing;Moaning Pain Intervention(s): Monitored during session;Repositioned;Premedicated before session    Home Living Family/patient expects to be discharged to:: Private residence Living Arrangements: Children Available Help at Discharge: Family;Personal care attendant;Available PRN/intermittently Type of Home: House     Home Layout: One level Home Equipment: Wheelchair - manual;Shower seat;Hospital bed      Prior Function Level of Independence: Needs assistance  Gait / Transfers Assistance Needed: Does not walk; only performs stand pivots to w/c with physical assistance ADL's / Homemaking Assistance Needed: Reports aides 7 days week (hours vary throughout week but has assitance most all waking hours); has assistance with ADLs     PT Goals (current goals can now be found in the care plan section) Acute Rehab PT Goals Patient Stated Goal: None stated PT Goal Formulation: With patient Time For Goal Achievement: 04/25/20 Potential to Achieve Goals: Fair Progress towards PT goals: Goals downgraded-see care plan    Frequency    Min 2X/week      PT Plan Current plan remains appropriate    Co-evaluation PT/OT/SLP Co-Evaluation/Treatment: Yes Reason for Co-Treatment: For patient/therapist safety;Necessary to address cognition/behavior during functional activity;To address functional/ADL transfers PT goals addressed during session: Mobility/safety with mobility;Balance OT goals addressed during session: ADL's and self-care      AM-PAC PT "6 Clicks" Mobility   Outcome Measure  Help needed turning from your back to your side while in a flat bed without using bedrails?: Total Help needed moving from lying on your back to sitting on the side of a flat bed without using bedrails?: Total Help needed moving to and from a bed to a chair (including a wheelchair)?: Total Help needed standing up from a chair using your arms  (e.g., wheelchair or bedside chair)?: Total Help needed to walk in hospital room?: Total Help needed climbing 3-5 steps with a railing? : Total 6 Click Score: 6    End of Session Equipment Utilized During Treatment: Oxygen Activity Tolerance: Patient limited by lethargy Patient left: in bed;with call bell/phone within reach;with bed alarm set Nurse Communication: Mobility status;Need for lift equipment PT Visit Diagnosis: Muscle weakness (generalized) (M62.81);Unsteadiness on feet (R26.81);Other abnormalities of gait and mobility (R26.89)     Time: 0174-9449 PT Time Calculation (min) (ACUTE ONLY): 21 min  Charges:  $Therapeutic Activity: 8-22 mins                    Mabeline Caras, PT, DPT Acute Rehabilitation Services  Pager [phone] Office [place] 04/11/2020, 12:33 PM

## 2020-04-11 NOTE — Progress Notes (Signed)
Mobility Specialist - Progress Note   04/11/20 1607  Mobility  Activity  (Cancel)  Mobility performed by Mobility specialist   PT advised not to see pt today.  Pricilla Handler Mobility Specialist Mobility Specialist Phone: [phone]

## 2020-04-11 NOTE — Progress Notes (Addendum)
  Progress Note    04/11/2020 7:51 AM 1 Day Post-Op  Subjective:  Having some pain  Tm 100.9 now 99.6 725'D-664'Q systolic HR 03'K NSR 74% 2VZ5GL  Vitals:   04/11/20 0546 04/11/20 0746  BP:  (!) 146/77  Pulse: 99 99  Resp: 20 19  Temp:  99.6 F (37.6 C)  SpO2: 97% 93%    Physical Exam: Incisions:  Dressing in tact and clean.   CBC    Component Value Date/Time   WBC 13.7 (H) 04/10/2020 0435   RBC 3.24 (L) 04/10/2020 0435   HGB 9.3 (L) 04/10/2020 0435   HCT 30.3 (L) 04/10/2020 0435   PLT 198 04/10/2020 0435   MCV 93.5 04/10/2020 0435   MCH 28.7 04/10/2020 0435   MCHC 30.7 04/10/2020 0435   RDW 16.4 (H) 04/10/2020 0435   LYMPHSABS 1.5 04/02/2019 2243   MONOABS 0.6 04/02/2019 2243   EOSABS 0.2 04/02/2019 2243   BASOSABS 0.0 04/02/2019 2243    BMET    Component Value Date/Time   NA 137 04/11/2020 0337   K 3.9 04/11/2020 0337   CL 98 04/11/2020 0337   CO2 26 04/11/2020 0337   GLUCOSE 95 04/11/2020 0337   BUN 16 04/11/2020 0337   CREATININE 4.84 (H) 04/11/2020 0337   CALCIUM 8.9 04/11/2020 0337   GFRNONAA 10 (L) 04/11/2020 0337   GFRAA 12 (L) 04/11/2020 0337    INR    Component Value Date/Time   INR 1.15 02/08/2013 0500     Intake/Output Summary (Last 24 hours) at 04/11/2020 0751 Last data filed at 04/11/2020 0600 Gross per 24 hour  Intake 649.1 ml  Output 3300 ml  Net -2650.9 ml     Assessment/Plan:  44 y.o. female is s/p right below knee amputation  1 Day Post-Op  -will check CBC this morning given blood loss in OR yesterday -pain control -we will take down her dressing tomorrow.  -HD per renal   Leontine Locket, PA-C [facility] [phone] 04/11/2020 7:51 AM     I have seen and evaluated the patient. I agree with the PA note as documented above.  Postop day 1 status post right BKA for critical limb ischemia with tissue loss.  Dressing is dry this morning.  We will work on pain control.  Check CBC given blood  loss in the OR per Dr. Donzetta Matters.  Anticipate discharge to SNF.  Dressing down tomorrow.  Marty Heck, MD [facility] [place] Office: [phone]

## 2020-04-11 NOTE — Progress Notes (Addendum)
Patient ID: Karen [name], female   DOB: Sep 20, 1976, 44 y.o.   MRN: [id number]  This NP visited patient at the bedside as a follow up for palliative medicine needs and emotional support.  Patient is lethargic but wakes up on encouragement.  She is S/P day 1 right BKA.   Her speech is slurred but this is her baseline.  Education offered on the importance of her participation in not only physical therapy but her efforts to cough and deep breathe, move about in the bed, and motivation to get out of bed to the chair.  We discussed and she agrees with need for SNF or rehab when medically stable for discharge  We discussed pain management strategies  I will augment her current medications with scheduled Tylenol. Consider increasing gabapentin dose.  Placed call to HPOA/[facility].  Continued education regarding current medical situation, her multiple comorbidities and her high risk for decompensation secondary to those multiple comorbidities.   Discussed with patient/H POA the importance of continued conversation with family, each other  and their  medical providers regarding overall plan of care and treatment options,  ensuring decisions are within the context of the patients values and GOCs.  Questions and concerns addressed   Discussed with Dr Carlis Abbott  This nurse practitioner informed  the patient/family and the attending that I will be out of the hospital until Monday morning.  If the patient is still hospitalized I will follow-up at that time.  Call palliative medicine team phone # [phone] with questions or concerns in the interim  Total time spent on the unit was 35 minutes  Greater than 50% of the time was spent in counseling and coordination of care  Wadie Lessen NP  Palliative Medicine Team Team Phone # [phone] Pager [phone]

## 2020-04-11 NOTE — Evaluation (Signed)
Speech Language Pathology Evaluation Patient Details Name: Karen [name] MRN: [id number] DOB: 19-Apr-1976 Today's Date: 04/11/2020 Time: 8938-1017 SLP Time Calculation (min) (ACUTE ONLY): 25 min  Problem List:  Patient Active Problem List   Diagnosis Date Noted  . Right leg pain   . Palliative care by specialist   . Nausea and vomiting 02/28/2020  . Abrasion 02/28/2020  . Avulsion fracture of ankle 02/28/2020  . Drug or medicinal substance causing adverse effect in therapeutic use 02/28/2020  . Elevated brain natriuretic peptide (BNP) level 02/28/2020  . HLD (hyperlipidemia) 02/28/2020  . Hypertension, poor control 02/28/2020  . Hypertensive emergency 02/28/2020  . Hypoxia 02/28/2020  . Mild dehydration 02/28/2020  . Sedimentation rate elevation 02/28/2020  . Strange and inexplicable behavior 51/10/5850  . COVID-19 01/19/2020  . Dysphasia status post cerebrovascular accident 11/06/2019  . Lumbar spondylosis 11/06/2019  . Anxiety and depression 06/04/2019  . Cerebral infarction due to thrombosis of unspecified precerebral artery ([name]) 06/04/2019  . Chronic pain syndrome 06/04/2019  . Neck pain on right side 06/04/2019  . Stroke due to embolism of right middle cerebral artery ([place]) 06/02/2019  . Pulmonary edema 02/20/2019  . Excoriation (skin-picking) disorder 11/22/2018  . Type 2 diabetes mellitus with hyperglycemia ([place]) 11/14/2018  . SBO (small bowel obstruction) ([place]) 07/08/2018  . Small bowel obstruction ([place]) 07/08/2018  . Hypercalcemia 02/17/2018  . Weakness generalized   . Acute on chronic systolic CHF (congestive heart failure) ([place])   . ESRD (end stage renal disease) on dialysis ([place])   . Homelessness   . Late effect of cerebrovascular accident (CVA)   . Dyspnea 01/27/2018  . Hypertension, uncontrolled 11/11/2017  . Fluid overload 11/11/2017  . Abdominal pain   . Sepsis due to methicillin resistant Staphylococcus aureus ([place]) 11/01/2017  . Penicillin allergy  10/15/2017  . Thrombocytopenia ([place]) 10/15/2017  . Personal history of transient ischemic attack (TIA), and cerebral infarction without residual deficits 09/15/2017  . Dysarthria 09/14/2017  . Lacunar infarct, acute ([place]) 09/14/2017  . Obesity 09/09/2017  . Chest pain 09/07/2017  . Other staphylococcus as the cause of diseases classified elsewhere 08/23/2017  . Diarrhea, unspecified 12/29/2016  . Fluid overload, unspecified 10/03/2016  . Hyperkalemia 10/03/2016  . Hypertension with fluid overload 10/03/2016  . Anemia in other chronic diseases classified elsewhere 09/08/2016  . High anion gap metabolic acidosis 77/82/4235  . Right lower lobe pneumonia 09/08/2016  . Iron deficiency anemia, unspecified 02/19/2016  . Complication of vascular dialysis catheter 02/18/2016  . Encounter for removal of sutures 02/11/2016  . Uncontrolled type 2 diabetes mellitus with chronic kidney disease on chronic dialysis, with long-term current use of insulin ([place]) 01/31/2016  . Drug noncompliance   . End-stage renal disease on hemodialysis ([place])   . Unspecified protein-calorie malnutrition ([place]) 01/24/2016  . Secondary hyperparathyroidism of renal origin ([place]) 01/22/2016  . Acquired absence of left leg below knee ([place]) 01/16/2016  . CHF (congestive heart failure) ([place]) 01/16/2016  . Coagulation defect, unspecified ([place]) 01/16/2016  . DNR (do not resuscitate) discussion 01/16/2016  . Family history of diabetes mellitus 01/16/2016  . Hypertensive chronic kidney disease with stage 1 through stage 4 chronic kidney disease, or unspecified chronic kidney disease 01/16/2016  . Nicotine dependence, other tobacco product, in remission 01/16/2016  . Pain, unspecified 01/16/2016  . Pruritus, unspecified 01/16/2016  . Red blood cell antibody positive 01/10/2016  . Hx of amputation below knee, right ([place]) 07/03/2013  . S/P BKA (below knee amputation) unilateral ([place]) 06/30/2013  . Physical deconditioning  06/30/2013   . Cardiomyopathy, ischemic - EF 45-50% with inf WMA by 2D 02/05/13 02/06/2013  . Essential hypertension 02/05/2013  . PAD (peripheral artery disease) ([place]) 02/05/2013  . Diabetic neuropathy ([place])   . Osteomyelitis of left great toe - S/P amputation 02/06/13 04/21/2011   Past Medical History:  Past Medical History:  Diagnosis Date  . Anemia   . Anxiety   . Eczema   . ESRD (end stage renal disease) ([place])    [place] [facility] [place]  . Exertional shortness of breath   . GERD (gastroesophageal reflux disease)   . Headache   . History of blood transfusion   . History of kidney stones    passed  . Hypertension   . Ischemic cardiomyopathy    by echo 2014  . Neuropathy   . Pneumonia 09/2016  . Type II diabetes mellitus ([place]) 1995   Past Surgical History:  Past Surgical History:  Procedure Laterality Date  . ABDOMINAL AORTOGRAM W/LOWER EXTREMITY Right 04/03/2020   Procedure: ABDOMINAL AORTOGRAM W/LOWER EXTREMITY;  Surgeon: Marty Heck, MD;  Location: [facility];  Service: Cardiovascular;  Laterality: Right;  . AMPUTATION Left 02/06/2013   Procedure: AMPUTATION LEFT GREAT TOE;  Surgeon: Wylene Simmer, MD;  Location: [facility];  Service: Orthopedics;  Laterality: Left;  . AMPUTATION Left 06/24/2013   Procedure: AMPUTATION BELOW KNEE ;  Surgeon: Wylene Simmer, MD;  Location: [facility];  Service: Orthopedics;  Laterality: Left;  . AMPUTATION Right 04/10/2020   Procedure: RIGHT AMPUTATION BELOW KNEE;  Surgeon: Waynetta Sandy, MD;  Location: [facility];  Service: Vascular;  Laterality: Right;  . AV FISTULA PLACEMENT Right 02/08/2017   Procedure: CREATION OF RIGHT ARM  BASILIC VEIN TO BRACHIAL ARTERY ARTERIOVENOUS (AV) FISTULA;  Surgeon: Rosetta Posner, MD;  Location: [facility];  Service: Vascular;  Laterality: Right;  . AV FISTULA PLACEMENT Left 09/10/2017   Procedure: INSERTION OF ARTERIOVENOUS (AV) GORE-TEX GRAFT  LEFT UPPER ARM;  Surgeon: Angelia Mould, MD;  Location: [facility];   Service: Vascular;  Laterality: Left;  . BASCILIC VEIN TRANSPOSITION Right 04/14/2017   Procedure: BASCILIC VEIN TRANSPOSITION-RIGHT 2ND STAGE;  Surgeon: Rosetta Posner, MD;  Location: [facility];  Service: Vascular;  Laterality: Right;  . CESAREAN SECTION  10/18/2006  . INSERTION OF DIALYSIS CATHETER    . PERIPHERAL VASCULAR BALLOON ANGIOPLASTY Right 04/03/2020   Procedure: PERIPHERAL VASCULAR BALLOON ANGIOPLASTY;  Surgeon: Marty Heck, MD;  Location: [facility];  Service: Cardiovascular;  Laterality: Right;  SFA  . WOUND DEBRIDEMENT Right 04/04/2020   Procedure: RIGHT FOOT DEBRIDEMENT;  Surgeon: Waynetta Sandy, MD;  Location: [facility];  Service: Vascular;  Laterality: Right;   HPI:  Pt is a 44 y.o. female, with history of end-stage renal disease on hemodialysis Monday Wednesday Friday, HTN, L BKA, PAD, ischemic cardiomyopathy, diabetes, previous stroke with some ongoing dysphagia who presented for evaluation of right heel wound.    MBS 01/28/18: mild dysphagia (as well as moderate dysarthria, likely attributable to the chronic ischemic demyelination of pontine fibers noted on 09/11/17 MRI) marked by mild delays in oral preparation, impaired laryngeal-vestibular closure, leading to entry of thin liquids into the larynx prior to the swallow.  Thin liquids penetrated to the level of the vocal folds, which elicited a cough response, but they were not aspirated.  Postural adjustments (head rotations, chin tuck) did not prevent penetration.     Assessment / Plan / Recommendation Clinical Impression  Pt participated  in speech/language evaluation. She was lethargic throughout the evaluation and the impact of this on her performance is considered. Pt reported that she believes her motor speech skills and cognition are currently at baseline. Pt has a well-documented history of cognitive-linguistic deficits and dysarthria dating back to 2018.The [facility] Mental Status Examination  was completed to evaluate the pt's cognitive-linguistic skills. Her score was adjusted since she stated that she was too tired to complete the last two sections. She achieved an adjusted score of 6/20 which is below the normal limits. She exhibited difficulty in the areas of awareness, mental flexibility, memory, and executive function. Pt presented with moderate-severe dysarthria characterized by a reduced ability to adequately coordinate respiration with speech, a rough vocal quality, and imprecise articulatory. Speech intelligibility was reduced at the word and phrase levels even with a known context. Pt reported that she has participated in dysarthria treatment in the past, but does not recall being given any compensatory strategies to improve speech intelligibility. Pt had reported to the referring MD that speaking is painful but she stated today that this is no longer the case. Pt appears to be at her baseline when compared to on previously documented SLP evaluations. SLP will follow briefly for education and instructions regarding use of compensatory strategies for speech intelligibility.     SLP Assessment  SLP Recommendation/Assessment: Patient needs continued Speech Lanaguage Pathology Services SLP Visit Diagnosis: Cognitive communication deficit (R41.841);Dysarthria and anarthria (R47.1)    Follow Up Recommendations  Skilled Nursing facility    Frequency and Duration min 2x/week  1 week      SLP Evaluation Cognition  Overall Cognitive Status: No family/caregiver present to determine baseline cognitive functioning Arousal/Alertness: Lethargic Orientation Level: Oriented X4 Attention: Focused;Sustained Focused Attention: Impaired Focused Attention Impairment: Verbal complex Sustained Attention: Impaired Sustained Attention Impairment: Verbal complex Memory: Impaired Memory Impairment: Retrieval deficit;Decreased recall of new information (Immediate: 4/5; delayed: 0/5; with cues:  0/5) Awareness: Impaired Awareness Impairment: Emergent impairment Problem Solving: Impaired Problem Solving Impairment: Verbal complex Executive Function: Organizing;Sequencing Sequencing: Impaired Sequencing Impairment: Verbal complex (Clock drawing: 0/4) Organizing: Impaired Organizing Impairment: Verbal complex (Backqward digit span: 1/3)       Comprehension  Auditory Comprehension Overall Auditory Comprehension: Appears within functional limits for tasks assessed Yes/No Questions: Within Functional Limits Conversation: Simple Reading Comprehension Reading Status: Not tested    Expression Expression Primary Mode of Expression: Verbal Verbal Expression Overall Verbal Expression: Appears within functional limits for tasks assessed Initiation: No impairment Level of Generative/Spontaneous Verbalization: Conversation Naming: No impairment Pragmatics: Impairment Impairments: Abnormal affect   Oral / Motor  Oral Motor/Sensory Function Overall Oral Motor/Sensory Function: Moderate impairment Facial ROM: Reduced left;Suspected CN VII (facial) dysfunction Facial Symmetry: Abnormal symmetry left;Suspected CN VII (facial) dysfunction Facial Strength: Reduced left;Suspected CN VII (facial) dysfunction Lingual ROM: Reduced left;Suspected CN XII (hypoglossal) dysfunction Motor Speech Overall Motor Speech: Impaired Respiration: Impaired Level of Impairment: Phrase Phonation: Hoarse;Breathy;Low vocal intensity Resonance: Within functional limits Articulation: Impaired Level of Impairment: Sentence Intelligibility: Intelligibility reduced Word: 50-74% accurate Phrase: 25-49% accurate Sentence: 25-49% accurate Conversation: 0-24% accurate Motor Planning: Witnin functional limits Motor Speech Errors: Aware;Consistent   Salvatore Poe I. Hardin Negus, [place], [place] Office number [phone] Pager [place] 04/11/2020, 4:20 PM

## 2020-04-11 NOTE — Progress Notes (Signed)
  Speech Language Pathology Treatment: Cognitive-Linquistic (Dysarthria)  Patient Details Name: Karen [name] MRN: [id number] DOB: [birth year] Today's Date: 04/11/2020 Time: 9735-3299 SLP Time Calculation (min) (ACUTE ONLY): 16 min  Assessment / Plan / Recommendation Clinical Impression  Karen [name] was seen for dysarthria treatment. She was alert and cooperative during the session but required frequent verbal stimulation to maintain an adequate level of alertness. She was educated regarding the nature of dysarthria, the severity of her impairments, and compensatory strategies to improve speech intelligibility. Karen [name] verbalized understanding regarding all areas of education. She used compensatory strategies at the word level with 75% accuracy increasing to 100% accuracy with verbal prompts for overarticulation. The session was terminated due to the Karen [name]'s increasing difficulty maintaining alertness; SLP will plan to see Karen [name] once more for treatment unless otherwise indicated.    HPI HPI: Karen [name] is a 44 y.o. female, with history of end-stage renal disease on hemodialysis Monday Wednesday Friday, HTN, L BKA, PAD, ischemic cardiomyopathy, diabetes, previous stroke with some ongoing dysphagia who presented for evaluation of right heel wound.    MBS 01/28/18: mild dysphagia (as well as moderate dysarthria, likely attributable to the chronic ischemic demyelination of pontine fibers noted on 09/11/17 MRI) marked by mild delays in oral preparation, impaired laryngeal-vestibular closure, leading to entry of thin liquids into the larynx prior to the swallow.  Thin liquids penetrated to the level of the vocal folds, which elicited a cough response, but they were not aspirated.  Postural adjustments (head rotations, chin tuck) did not prevent penetration.        SLP Plan  Continue with current plan of care  Patient needs continued Speech Lanaguage Pathology Services    Recommendations                   Follow up  Recommendations: Skilled Nursing facility SLP Visit Diagnosis: Dysarthria and anarthria (R47.1) Plan: Continue with current plan of care       Brice Potteiger I. Hardin Negus, [place], [place] Office number [phone] Pager [facility] 04/11/2020, 4:31 PM

## 2020-04-11 NOTE — Progress Notes (Addendum)
Pharmacy Antibiotic Note  Karen [name] is a 44 y.o. female admitted on 04/03/2020 with necrotic foot wound non-responding to Clindamycin and concern for evolving sepsis. Pharmacy has been consulted for Vancomycin and Zosyn dosing.   Patient is now s/p BKA. Afebrile, WBC improving. Patient receives HD on Monday, Wednesday, Friday with next dialysis tomorrow.   Plan: Continue vancomycin 1000mg  IV post-HD MWF.  Continue cefepime 2g IV post-HD MWF.  Continue metronidazole 500mg  q8h  Stop dates entered for abx since patient is s/p surgery.  Follow-up clinical status, hemodialysis plans.   Height: 5' 5.98" (167.6 cm) Weight: 104.4 kg (230 lb 2.6 oz) IBW/kg (Calculated) : 59.26  Temp (24hrs), Avg:99.1 F (37.3 C), Min:97.7 F (36.5 C), Max:100.9 F (38.3 C)  Recent Labs  Lab 04/06/20 0449 04/07/20 0355 04/08/20 1027 04/08/20 1259 04/08/20 1405 04/08/20 1406 04/08/20 2102 04/09/20 1342 04/10/20 0435 04/11/20 0337  WBC 8.1 8.9  --   --   --  9.6 15.3*  --  13.7*  --   CREATININE 4.95*  --   --   --  8.05*  --   --  6.51* 7.91* 4.84*  LATICACIDVEN  --   --  1.0 1.0  --   --   --   --   --   --     Estimated Creatinine Clearance: 18.3 mL/min (A) (by C-G formula based on SCr of 4.84 mg/dL (H)).    Allergies  Allergen Reactions  . Baclofen Itching  . Penicillins Anaphylaxis, Hives, Rash and Other (See Comments)    PATIENT HAD A PCN REACTION WITH IMMEDIATE RASH, FACIAL/TONGUE/THROAT SWELLING, SOB, OR LIGHTHEADEDNESS WITH HYPOTENSION:  #  #  #  YES  #  #  #   SEVERE RASH INVOLVING MUCUS MEMBRANES or SKIN NECROSIS: #  #  #  YES  #  #  # Has patient had a PCN reaction that required hospitalization No Has patient had a PCN reaction occurring within the last 10 years: No If all of the above answers are "NO", then may proceed with Cephalosporin use.  09/10/16- tolerated Cefepime   . Morphine And Related Hives and Rash  . Novolog [Insulin Aspart] Other (See Comments)    Cramps/ Gi  distress    Antimicrobials this admission: 7/15 Clindamycin >> 7/19 7/19 Vancomycin >> 7/23 7/19 Cefepime >> 7/23 7/19 Flagyl >> 7/23  Dose adjustments this admission:   Microbiology results: 7/15 COVID negative 7/19 Blood >> 7/19 MRSA PCR >>  Thank you for allowing pharmacy to be a part of this patient's care.  Mercy Riding, PharmD PGY1 Acute Care Pharmacy Resident Please refer to [facility] for unit-specific pharmacist  I discussed / reviewed the pharmacy note by Dr. Sherryll Burger and I agree with the resident's findings and plans as documented.  Thank you Anette Guarneri, PharmD

## 2020-04-11 NOTE — Progress Notes (Signed)
[facility] Progress Note   Subjective:  Completed dialysis yesterday net UF 3L  Endorses SOB.  O2 sats 96% on 2L [place] Had R BKA yesterday -pain today,  due for pain meds soon per RN    Objective Vitals:   04/11/20 0546 04/11/20 0746 04/11/20 0945 04/11/20 1049  BP:  (!) 146/77 (!) 151/83 (!) 145/79  Pulse: 99 99 91 95  Resp: 20 19 (!) 22 23  Temp:  99.6 F (37.6 C)  99.2 F (37.3 C)  TempSrc:  Oral  Oral  SpO2: 97% 93% 97% 96%  Weight:      Height:        Physical Exam General: Chronically ill appearing, on nasal oxygen  Heart: RRR Lungs: Diminished, poor effort, occasional wheeze  Abdomen: soft non-tender  Extremities: Bilat BKA R stump wrapped  Dialysis Access: LUE AVG +bruit    Additional Objective Labs: Basic Metabolic Panel: Recent Labs  Lab 04/09/20 1342 04/10/20 0435 04/11/20 0337  NA 136 135 137  K 4.3 4.2 3.9  CL 96* 96* 98  CO2 27 24 26   GLUCOSE 90 120* 95  BUN 25* 31* 16  CREATININE 6.51* 7.91* 4.84*  CALCIUM 8.7* 9.1 8.9  PHOS 5.9* 6.9* 5.0*   CBC: Recent Labs  Lab 04/06/20 0449 04/06/20 0449 04/07/20 0355 04/07/20 0355 04/08/20 1406 04/08/20 2102 04/10/20 0435  WBC 8.1   < > 8.9   < > 9.6 15.3* 13.7*  HGB 7.2*   < > 6.8*   < > 6.6* 9.3* 9.3*  HCT 24.2*   < > 22.8*   < > 22.5* 29.8* 30.3*  MCV 95.3  --  94.2  --  94.1 90.0 93.5  PLT 144*   < > 144*   < > 148* 111* 198   < > = values in this interval not displayed.   Blood Culture    Component Value Date/Time   SDES BLOOD LEFT ANTECUBITAL 04/08/2020 1028   SPECREQUEST  04/08/2020 1028    BOTTLES DRAWN AEROBIC ONLY Blood Culture results may not be optimal due to an inadequate volume of blood received in culture bottles   CULT  04/08/2020 1028    NO GROWTH 2 DAYS Performed at [facility], [place] [address] [place], [place] [address]    REPTSTATUS PENDING 04/08/2020 1028     Medications: . sodium chloride    . sodium chloride    . sodium chloride    .  ceFEPime (MAXIPIME) IV 2 g (04/10/20 1818)  . ferric gluconate (FERRLECIT/NULECIT) IV 62.5 mg (04/11/20 1111)  . magnesium sulfate bolus IVPB    . metronidazole 500 mg (04/11/20 0728)  . vancomycin Stopped (04/10/20 1637)   . amLODipine  10 mg Oral Daily  . aspirin EC  81 mg Oral Daily  . atorvastatin  40 mg Oral Daily  . calcitRIOL  1.5 mcg Oral Q T,Th,Sa-HD  . Chlorhexidine Gluconate Cloth  6 each Topical Q0600  . Chlorhexidine Gluconate Cloth  6 each Topical Q0600  . clopidogrel  300 mg Oral Once   Followed by  . clopidogrel  75 mg Oral Q breakfast  . darbepoetin (ARANESP) injection - DIALYSIS  150 mcg Intravenous Q Wed-HD  . docusate sodium  100 mg Oral Daily  . doxercalciferol  7 mcg Intravenous Q M,W,F-HD  . gabapentin  100 mg Oral QHS  . heparin injection (subcutaneous)  5,000 Units Subcutaneous Q8H  . insulin aspart  0-15 Units Subcutaneous TID WC  .  insulin aspart  0-5 Units Subcutaneous QHS  . insulin glargine  15 Units Subcutaneous QHS  . methocarbamol  500 mg Oral BID  . metoprolol succinate  12.5 mg Oral Daily  . multivitamin  1 tablet Oral QHS  . pantoprazole  40 mg Oral Daily  . polyethylene glycol  17 g Oral Daily  . sevelamer carbonate  2,400 mg Oral TID WC  . sodium chloride flush  3 mL Intravenous Q12H    Dialysis Orders: [place] MWF 4 hrs 180NRe 400/800 99.5 kg 2.0 K/ 2.25 Ca UFP 2 L AVG -Heparin 6000 units IV TIW -Hectorol 7 mcg IV TIW -Mircera 150 mcg IV q 2 weeks (last dose 03/22/2020 Last HGB 7.7 03/27/20)  Assessment/Plan: 1. Critical limb ischemia with necrotic right heel ulcer status post sharp excisional debridement: Underwent R BKA 7/21 -Dr. Donzetta Matters. IV antibiotics/wound care per [facility]. Avoid morphine in ESRD patient.  2. ESRD - HD MWF. Next HD 7/23.  3. HTN/volume-  Continue home meds/UF with HD. Not yet to outpatient dry weight. UF 3-4L next HD.  4. Anemia- got PRBC x 2 units on 7/19 with HD.  with significant inflammatory complex from nonhealing  heel ulcer/ongoing bleeding). Start aranesp 150 mcg weekly on Wed on 7/21   5. MBD of CKD -  Continue binders/Hectorol  6. Nutrition - Renal diet/fluid restrictions.   Lynnda Child PA-C [facility] 04/11/2020,11:23 AM

## 2020-04-11 NOTE — Evaluation (Addendum)
Occupational Therapy Evaluation Patient Details Name: Karen [name] MRN: [id number] DOB: Jan 20, 1976 Today's Date: 04/11/2020    History of Present Illness 44 y.o. female presenting with right heel wound s/p R proximal SFA angioplasty on 04/03/20 and debridement of R heel skin and soft tissue on 04/04/20. Pt. to OR on 7/21 for R BKA. 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   Clinical Impression   PTA, patient was living with family and required assist from [facility] for BADLs and functional transfers. Per chart, patient was not ambulating at baseline but required use of wc in home and community dwellings. Patient currently presents below baseline level of function requiring +2 assist for bed mobility, Min guard to Max A for static balance seated EOB, Mod A for UB BADLs, and Max A +2 for LB BADLs. Patient would benefit from continued acute OT services to maximize safety and independence with BADLs, bed mobility, and functional transfers. Recommendation for SNF rehab prior to return home.       Follow Up Recommendations  SNF;Supervision/Assistance - 24 hour    Equipment Recommendations  Other (comment) (Defer to next level of care)    Recommendations for Other Services       Precautions / Restrictions Precautions Precautions: Fall Precaution Comments: Bilateral BKA Restrictions Weight Bearing Restrictions: No      Mobility Bed Mobility Overal bed mobility: Needs Assistance Bed Mobility: Supine to Sit     Supine to sit: Max assist;+2 for physical assistance;HOB elevated     General bed mobility comments: Cues for sequencing and for attention to task. Assist for advancement of bilateral residual limbs toward EOB, hand placement, and use of chuck pad.   Transfers Overall transfer level: Needs assistance                    Balance Overall balance assessment: Needs assistance Sitting-balance support:  Bilateral upper extremity supported Sitting balance-Leahy Scale: Poor Sitting balance - Comments: Min guard at best and Max A at most with fatigue to maintain static sitting balance at EOB.                                    ADL either performed or assessed with clinical judgement   ADL Overall ADL's : Needs assistance/impaired Eating/Feeding: Minimal assistance Eating/Feeding Details (indicate cue type and reason): Min A to steady cup for pt to bring toward mouth.  Grooming: Moderate assistance;Wash/dry hands;Wash/dry face;Sitting (Assist to maintain static sitting balance at EOB. ) Grooming Details (indicate cue type and reason): Mod A and hand over hand to wash face 2/2 lethargy.  Upper Body Bathing: Moderate assistance;Sitting   Lower Body Bathing: Maximal assistance;+2 for physical assistance;Sitting/lateral leans   Upper Body Dressing : Moderate assistance;Sitting   Lower Body Dressing: Maximal assistance;+2 for physical assistance;Sitting/lateral leans   Toilet Transfer: Maximal assistance;+2 for physical assistance;Anterior/posterior;Transfer board;BSC;Requires wide/bariatric           Functional mobility during ADLs: Wheelchair (Unable )       Vision Patient Visual Report: No change from baseline Vision Assessment?: Yes;Vision impaired- to be further tested in functional context Additional Comments: Assessment limited 2/2 letahrgy. 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 Pt. able to correclty identify number of therapists fingers presented in central vision. Unable to correctly identify time on wall clock. Peripheral vision appears to be impaired  from limited assessment this date.      Perception Perception Perception Tested?: Yes Perception Deficits: Inattention/neglect (L visual field however limited assessment 2/2 lethargy ) Inattention/Neglect: Does not attend to left visual field;Does not attend to left side of body;Impaired- to be further tested in  functional context   [place] tested?: Not tested    Pertinent Vitals/Pain Pain Assessment: Faces Faces Pain Scale: Hurts little more Pain Location: R residual limb with movement  Pain Descriptors / Indicators: Discomfort Pain Intervention(s): Monitored during session;Limited activity within patient's tolerance;Premedicated before session     Hand Dominance Right   Extremity/Trunk Assessment Upper Extremity Assessment Upper Extremity Assessment: Generalized weakness;LUE deficits/detail;Difficult to assess due to impaired cognition RUE Deficits / Details: AROM WFL. MMT difficult to assess 2/2 lethargy.  LUE Deficits / Details: Decreased active shoulder abd/add and flex/ext. Elbow WFL. Patient able to make weak fist. LUE Sensation: decreased light touch (Assessment limited 2/2 lethargy ) LUE Coordination: decreased fine motor;decreased gross motor (Assessment limited 2/2 lethargy )   Lower Extremity Assessment Lower Extremity Assessment: Defer to PT evaluation       Communication Communication Communication: Expressive difficulties   Cognition Arousal/Alertness: Lethargic Behavior During Therapy: Flat affect Overall Cognitive Status: Impaired/Different from baseline Area of Impairment: Memory;Following commands;Awareness;Safety/judgement;Problem solving                     Memory: Decreased short-term memory Following Commands: Follows one step commands inconsistently;Follows one step commands with increased time Safety/Judgement: Decreased awareness of safety;Decreased awareness of deficits Awareness: Intellectual Problem Solving: Slow processing;Requires verbal cues;Requires tactile cues;Decreased initiation;Difficulty sequencing General Comments: Pt. very lethargic throughout session requiring repeat verbal commands for participation. Unable to accurately assess coordination, vision, and strength 2/2 lethargy limiting command following. Patient oriented  to person/place/time(mnth/yr) but not fully oriented to situation.    General Comments  Pt. reports receiving pain medication prior to PT/OT session. Pt. very lethargic with repeat reminders to keep eyes open. Intermittent 1 step command following throughout session with ~50% accuracy.     Exercises     Shoulder Instructions      Home Living Family/patient expects to be discharged to:: Private residence Living Arrangements: Children Available Help at Discharge: Family;Personal care attendant;Available PRN/intermittently Type of Home: House       Home Layout: One level         Biochemist, clinical: Standard     Home Equipment: Wheelchair - manual;Shower seat;Hospital bed          Prior Functioning/Environment Level of Independence: Needs assistance  Gait / Transfers Assistance Needed: Does not walk; only performs stand pivots to w/c with physical assistance ADL's / Homemaking Assistance Needed: Reports aides 7 days week (hours vary throughout week but has assitance most all waking hours); has assistance with ADLs Communication / Swallowing Assistance Needed: Previous CVA with residual dysphagia and dysarthria          OT Problem List: Decreased strength;Decreased activity tolerance;Impaired balance (sitting and/or standing);Impaired vision/perception;Decreased coordination;Decreased cognition;Decreased safety awareness;Decreased knowledge of use of DME or AE;Impaired sensation;Pain;Impaired UE functional use      OT Treatment/Interventions: Self-care/ADL training;Therapeutic exercise;Neuromuscular education;Energy conservation;DME and/or AE instruction;Therapeutic activities;Patient/family education;Balance training    OT Goals(Current goals can be found in the care plan section) Acute Rehab OT Goals Patient Stated Goal: No goals stated OT Goal Formulation: Patient unable to participate in goal setting Time For Goal Achievement: 04/25/20 Potential to Achieve Goals:  Good ADL Goals Pt Will Perform Grooming: with set-up;sitting  Pt Will Perform Lower Body Bathing: with min assist;sitting/lateral leans;bed level Pt Will Perform Lower Body Dressing: with min assist;bed level;sitting/lateral leans Pt Will Transfer to Toilet: with min assist;with transfer board;anterior/posterior transfer;bedside commode Pt Will Perform Toileting - Clothing Manipulation and hygiene: with min assist;sitting/lateral leans;bed level Additional ADL Goal #1: Patient will follow 1-step verbal commands with 75% accuracy in prep for BADLs and functional transfers.  OT Frequency: Min 2X/week   Barriers to D/C:            Co-evaluation PT/OT/SLP Co-Evaluation/Treatment: Yes Reason for Co-Treatment: For patient/therapist safety;To address functional/ADL transfers   OT goals addressed during session: ADL's and self-care      AM-PAC OT "6 Clicks" Daily Activity     Outcome Measure Help from another person eating meals?: A Little Help from another person taking care of personal grooming?: A Lot Help from another person toileting, which includes using toliet, bedpan, or urinal?: Total Help from another person bathing (including washing, rinsing, drying)?: Total Help from another person to put on and taking off regular upper body clothing?: A Lot Help from another person to put on and taking off regular lower body clothing?: Total 6 Click Score: 10   End of Session Equipment Utilized During Treatment: Oxygen (1-3L via [place]) Nurse Communication: Other (comment) (Supplemental O2 needs via [place].  )  Activity Tolerance: Patient limited by lethargy Patient left: in bed;with call bell/phone within reach;with bed alarm set  OT Visit Diagnosis: Muscle weakness (generalized) (M62.81);Cognitive communication deficit (R41.841);Pain Symptoms and signs involving cognitive functions: Cerebral infarction Pain - Right/Left: Right Pain - part of body: Leg (Residual limb)                Time:  0539-7673 OT Time Calculation (min): 33 min Charges:  OT General Charges $OT Visit: 1 Visit OT Evaluation $OT Eval Moderate Complexity: 1 Mod  Emmersen Garraway H. OTR/L Supplemental OT, Department of rehab services [phone]  Derica Leiber R H. 04/11/2020, 10:13 AM

## 2020-04-12 LAB — CBC
HCT: 24 % — ABNORMAL LOW (ref 36.0–46.0)
Hemoglobin: 7.3 g/dL — ABNORMAL LOW (ref 12.0–15.0)
MCH: 28.4 pg (ref 26.0–34.0)
MCHC: 30.4 g/dL (ref 30.0–36.0)
MCV: 93.4 fL (ref 80.0–100.0)
Platelets: 167 10*3/uL (ref 150–400)
RBC: 2.57 MIL/uL — ABNORMAL LOW (ref 3.87–5.11)
RDW: 15.7 % — ABNORMAL HIGH (ref 11.5–15.5)
WBC: 10.4 10*3/uL (ref 4.0–10.5)
nRBC: 0 % (ref 0.0–0.2)

## 2020-04-12 LAB — RENAL FUNCTION PANEL
Albumin: 2.5 g/dL — ABNORMAL LOW (ref 3.5–5.0)
Anion gap: 15 (ref 5–15)
BUN: 24 mg/dL — ABNORMAL HIGH (ref 6–20)
CO2: 23 mmol/L (ref 22–32)
Calcium: 9.4 mg/dL (ref 8.9–10.3)
Chloride: 99 mmol/L (ref 98–111)
Creatinine, Ser: 6.41 mg/dL — ABNORMAL HIGH (ref 0.44–1.00)
GFR calc Af Amer: 8 mL/min — ABNORMAL LOW (ref >60)
GFR calc non Af Amer: 7 mL/min — ABNORMAL LOW (ref >60)
Glucose, Bld: 99 mg/dL (ref 70–99)
Phosphorus: 6 mg/dL — ABNORMAL HIGH (ref 2.5–4.6)
Potassium: 4.6 mmol/L (ref 3.5–5.1)
Sodium: 137 mmol/L (ref 135–145)

## 2020-04-12 LAB — GLUCOSE, CAPILLARY
Glucose-Capillary: 75 mg/dL (ref 70–99)
Glucose-Capillary: 142 mg/dL — ABNORMAL HIGH (ref 70–99)
Glucose-Capillary: 115 mg/dL — ABNORMAL HIGH (ref 70–99)
Glucose-Capillary: 133 mg/dL — ABNORMAL HIGH (ref 70–99)

## 2020-04-12 MED ORDER — VANCOMYCIN HCL IN DEXTROSE 1-5 GM/200ML-% IV SOLN
INTRAVENOUS | Status: AC
  Administered 2020-04-12: 1000 mg via INTRAVENOUS
  Filled 2020-04-12: qty 200

## 2020-04-12 MED ORDER — DOXERCALCIFEROL 4 MCG/2ML IV SOLN
INTRAVENOUS | Status: AC
  Filled 2020-04-12: qty 4

## 2020-04-12 NOTE — Progress Notes (Signed)
NT notified me that there was a medical glove in Pt's stool, which I observed.  Pt will be placed on "glove eating precaution".  Room searched for other gloves in Pt's reach.  PA notified. Orders to monitor Pt for signs of illeus or other GI distress.

## 2020-04-12 NOTE — Plan of Care (Signed)
  Problem: Clinical Measurements: Goal: Signs and symptoms of infection will decrease Outcome: Progressing   

## 2020-04-12 NOTE — Progress Notes (Signed)
SLP Cancellation Note  Patient Details Name: Karen [name] MRN: [id number] DOB: March 08, 1976   Cancelled treatment:       Reason Eval/Treat Not Completed: Patient at procedure or test/unavailable (Pt at HD at this time. SLP will follow up. )  Delfina Schreurs I. Hardin Negus, [place], [place] Office number [phone] Pager [place] 04/12/2020, 12:25 PM

## 2020-04-12 NOTE — Progress Notes (Addendum)
  Progress Note    04/12/2020 7:55 AM 2 Days Post-Op  Subjective:  C/o pain; says she can completely straighten her leg.  Afebrile HR 32'Y-23'X NSR 435'W systolic 86% RA  Vitals:   04/12/20 0328 04/12/20 0748  BP: (!) 150/77 (!) 156/83  Pulse: 84 88  Resp: 20 16  Temp: 98.1 F (36.7 C) 98.3 F (36.8 C)  SpO2: 100% 98%    Physical Exam: Incisions:  Dressing removed and incision is clean and dry with staples in tact.    CBC    Component Value Date/Time   WBC 10.4 04/12/2020 0329   RBC 2.57 (L) 04/12/2020 0329   HGB 7.3 (L) 04/12/2020 0329   HCT 24.0 (L) 04/12/2020 0329   PLT 167 04/12/2020 0329   MCV 93.4 04/12/2020 0329   MCH 28.4 04/12/2020 0329   MCHC 30.4 04/12/2020 0329   RDW 15.7 (H) 04/12/2020 0329   LYMPHSABS 1.5 04/02/2019 2243   MONOABS 0.6 04/02/2019 2243   EOSABS 0.2 04/02/2019 2243   BASOSABS 0.0 04/02/2019 2243    BMET    Component Value Date/Time   NA 137 04/12/2020 0329   K 4.6 04/12/2020 0329   CL 99 04/12/2020 0329   CO2 23 04/12/2020 0329   GLUCOSE 99 04/12/2020 0329   BUN 24 (H) 04/12/2020 0329   CREATININE 6.41 (H) 04/12/2020 0329   CALCIUM 9.4 04/12/2020 0329   GFRNONAA 7 (L) 04/12/2020 0329   GFRAA 8 (L) 04/12/2020 0329    INR    Component Value Date/Time   INR 1.15 02/08/2013 0500     Intake/Output Summary (Last 24 hours) at 04/12/2020 0755 Last data filed at 04/12/2020 0144 Gross per 24 hour  Intake 1165.9 ml  Output --  Net 1165.9 ml     Assessment/Plan:  44 y.o. female is s/p right below knee amputation  2 Days Post-Op  -dressing removed and incision is clean and dry with staples in tact.    Okay to leave open to air. -will continue vancomycin another 24 hrs then dc.  Pt remains afebrile.   -TOC for SNF placement -discussed with pt to work on straightening her right knee   Leontine Locket, PA-C [facility] [phone] 04/12/2020 7:55 AM   I have seen and evaluated the patient. I agree  with the PA note as documented above.  Status post right below-knee amputation for nonsalvageable limb.  She has had no fevers since amputation.  We will continue her antibiotics for 24 more hours.  Hopefully have source control at this time.  Dressing removed today and BKA looks good.  Discussed leaving staples for 3 weeks.  PT recommends SNF and will get case management involved.  This was already discussed with the patient when we engaged palliative care and she is expecting to go to a facility.  Appreciate nephrology following for her ESRD.  Marty Heck, MD [facility] [place] Office: [phone]

## 2020-04-12 NOTE — Progress Notes (Signed)
Patient had another BM with gloves mixed with it. Asked patient why she was eating gloves, she told me that "she was down with it". Room was again looked over for any gloves within patient's reach. Will continue to monitor

## 2020-04-12 NOTE — Plan of Care (Signed)
°  Problem: Respiratory: °Goal: Ability to maintain adequate ventilation will improve °Outcome: Progressing °  °

## 2020-04-13 LAB — CULTURE, BLOOD (ROUTINE X 2)
Culture: NO GROWTH
Culture: NO GROWTH

## 2020-04-13 LAB — GLUCOSE, CAPILLARY
Glucose-Capillary: 96 mg/dL (ref 70–99)
Glucose-Capillary: 117 mg/dL — ABNORMAL HIGH (ref 70–99)
Glucose-Capillary: 131 mg/dL — ABNORMAL HIGH (ref 70–99)
Glucose-Capillary: 86 mg/dL (ref 70–99)

## 2020-04-13 LAB — RENAL FUNCTION PANEL
Albumin: 2.5 g/dL — ABNORMAL LOW (ref 3.5–5.0)
Anion gap: 12 (ref 5–15)
BUN: 10 mg/dL (ref 6–20)
CO2: 28 mmol/L (ref 22–32)
Calcium: 9.4 mg/dL (ref 8.9–10.3)
Chloride: 100 mmol/L (ref 98–111)
Creatinine, Ser: 3.9 mg/dL — ABNORMAL HIGH (ref 0.44–1.00)
GFR calc Af Amer: 15 mL/min — ABNORMAL LOW (ref >60)
GFR calc non Af Amer: 13 mL/min — ABNORMAL LOW (ref >60)
Glucose, Bld: 107 mg/dL — ABNORMAL HIGH (ref 70–99)
Phosphorus: 3.3 mg/dL (ref 2.5–4.6)
Potassium: 3.6 mmol/L (ref 3.5–5.1)
Sodium: 140 mmol/L (ref 135–145)

## 2020-04-13 MED ORDER — CALCITRIOL 0.25 MCG PO CAPS: 1.5000 ug | ORAL_CAPSULE | ORAL | Status: DC

## 2020-04-13 NOTE — Progress Notes (Signed)
Mobility Specialist: Progress Note   04/13/20 1458  Mobility  Activity  (Cancel)  Mobility performed by Mobility specialist   After consulting with pt's RN, not going to see pt today.   [facility] Karen [name] Mobility Specialist

## 2020-04-13 NOTE — Progress Notes (Addendum)
Progress Note    04/13/2020 8:29 AM 3 Days Post-Op  Subjective:  Notified yesterday evening that patient had passed exam gloves in stool.  She has been tolerating diet without N, V or abdominal distention. All gloves have been removed from patient's reach. Complaining of thirst this morning, minor incisional pain.   Vitals:   04/13/20 0358 04/13/20 0825  BP: (!) 145/81 123/70  Pulse: 91 91  Resp: 19 20  Temp: 99 F (37.2 C) 99.1 F (37.3 C)  SpO2: 98% 98%   Tm 99.1 Physical Exam: Neuro: Awake and alert. Oriented times 4. In NAD Cardiac: RRR Lungs:  CTAB Extremities:  Left BKA incision well approximated. Flaps are warm and well perfused. Minimal edema. Abdomen:  Soft, ND, NT, normoactive BS  CBC    Component Value Date/Time   WBC 10.4 04/12/2020 0329   RBC 2.57 (L) 04/12/2020 0329   HGB 7.3 (L) 04/12/2020 0329   HCT 24.0 (L) 04/12/2020 0329   PLT 167 04/12/2020 0329   MCV 93.4 04/12/2020 0329   MCH 28.4 04/12/2020 0329   MCHC 30.4 04/12/2020 0329   RDW 15.7 (H) 04/12/2020 0329   LYMPHSABS 1.5 04/02/2019 2243   MONOABS 0.6 04/02/2019 2243   EOSABS 0.2 04/02/2019 2243   BASOSABS 0.0 04/02/2019 2243    BMET    Component Value Date/Time   NA 140 04/13/2020 0331   K 3.6 04/13/2020 0331   CL 100 04/13/2020 0331   CO2 28 04/13/2020 0331   GLUCOSE 107 (H) 04/13/2020 0331   BUN 10 04/13/2020 0331   CREATININE 3.90 (H) 04/13/2020 0331   CALCIUM 9.4 04/13/2020 0331   GFRNONAA 13 (L) 04/13/2020 0331   GFRAA 15 (L) 04/13/2020 0331     Intake/Output Summary (Last 24 hours) at 04/13/2020 0829 Last data filed at 04/12/2020 2324 Gross per 24 hour  Intake 200 ml  Output 3700 ml  Net -3500 ml    HOSPITAL MEDICATIONS Scheduled Meds: . acetaminophen  650 mg Oral Q8H  . amLODipine  10 mg Oral Daily  . aspirin EC  81 mg Oral Daily  . atorvastatin  40 mg Oral Daily  . calcitRIOL  1.5 mcg Oral Q T,Th,Sa-HD  . Chlorhexidine Gluconate Cloth  6 each Topical Q0600  .  Chlorhexidine Gluconate Cloth  6 each Topical Q0600  . clopidogrel  300 mg Oral Once   Followed by  . clopidogrel  75 mg Oral Q breakfast  . darbepoetin (ARANESP) injection - DIALYSIS  150 mcg Intravenous Q Wed-HD  . docusate sodium  100 mg Oral Daily  . doxercalciferol  7 mcg Intravenous Q M,W,F-HD  . gabapentin  100 mg Oral QHS  . heparin injection (subcutaneous)  5,000 Units Subcutaneous Q8H  . insulin aspart  0-15 Units Subcutaneous TID WC  . insulin aspart  0-5 Units Subcutaneous QHS  . insulin glargine  15 Units Subcutaneous QHS  . methocarbamol  500 mg Oral BID  . metoprolol succinate  12.5 mg Oral Daily  . multivitamin  1 tablet Oral QHS  . pantoprazole  40 mg Oral Daily  . polyethylene glycol  17 g Oral Daily  . sevelamer carbonate  2,400 mg Oral TID WC  . sodium chloride flush  3 mL Intravenous Q12H   Continuous Infusions: . sodium chloride    . sodium chloride    . sodium chloride    . ferric gluconate (FERRLECIT/NULECIT) IV 62.5 mg (04/11/20 1111)  . magnesium sulfate bolus IVPB     PRN  Meds:.sodium chloride, sodium chloride, sodium chloride, acetaminophen **OR** acetaminophen, diphenhydrAMINE, guaiFENesin-dextromethorphan, heparin, hydrALAZINE, HYDROmorphone (DILAUDID) injection, labetalol, lidocaine (PF), lidocaine-prilocaine, magnesium sulfate bolus IVPB, metoprolol tartrate, ondansetron (ZOFRAN) IV, ondansetron, oxyCODONE, pentafluoroprop-tetrafluoroeth, phenol, senna-docusate, sodium chloride flush  Assessment: POD 3   Status post right below-knee amputation for nonsalvageable limb.  She has had no fevers since amputation. Now off Vancomycin   Plan: -Awaiting SNF placement -Continue Plavix, asa, statin -DVT prophylaxis:  Heparin [place]   Risa Grill, PA-C [facility] [phone] 04/13/2020  8:29 AM   I have seen and evaluated the patient. I agree with the PA note as documented above.  44 year old female status post right BKA for  nonsalvageable limb after attempted revascularization and heel debridement.  No fevers in the past 24 hours and since her amputation now we have source control.  We will stop antibiotics today given she has received them 48 hours postop.  Plan is SNF and appreciate case management.  Right BKA looks good.  Marty Heck, MD [facility] [place] Office: [phone]

## 2020-04-13 NOTE — Progress Notes (Addendum)
Subjective: In bed no complaints with R BKA, said tolerate dialysis yesterday, noted 3.7 L UF, noted awaiting SNF  Objective Vital signs in last 24 hours: Vitals:   04/13/20 0358 04/13/20 0825 04/13/20 1010 04/13/20 1014  BP: (!) 145/81 123/70 (!) 142/78   Pulse: 91 91 90 90  Resp: 19 20  20   Temp: 99 F (37.2 C) 99.1 F (37.3 C)    TempSrc: Oral Oral    SpO2: 98% 98%  100%  Weight:      Height:       Weight change: -2 kg  Physical Exam: General: Alert appears chronically ill NAD Heart: RRR no rub Lungs: CTA nonlabored Abdomen: Obese bowel sounds normoactive soft nontender, nondistended Extremities: Right BKA surgical staples, incision dry , left BKA minimal edema  Dialysis Access: Left upper arm AV Gore-Tex graft positive bruit  Dialysis Orders: [facility] MWF 4 hrs 180NRe 400/800 99.5 kg 2.0 K/ 2.25 Ca UFP 2 L AVG -Heparin 6000 units IV TIW -Hectorol 7 mcg IV TIW -Mircera 150 mcg IV q 2 weeks (last dose 03/22/2020 Last HGB 7.7 03/27/20)  Problem/Plan: 1. 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 Donzetta Matters.  Now off IV antibiotics/and wound care per VVS.   Awaiting SNF 2. ESRD - HD MWF. Next HD 7/ 26.  3. HTN/volume-  Continue home meds/UF with HD 3 L tolerated, slightly below dry weight now with bed weights. 4. Anemia- yesterday Hgb 7.3 got PRBC x 2 units on 7/19 with HD. with significant inflammatory complex from nonhealing heel ulcer/ongoing bleeding). Started  aranesp 150 mcg weekly on Wed on 7/21  follow-up trend 5. MBD of CKD -  Continue binders/Hectorol  6. Nutrition - Renal diet/fluid restrictions.   Ernest Haber, PA-C [facility] [phone] 04/13/2020,10:50 AM  LOS: 9 days   Labs: Basic Metabolic Panel: Recent Labs  Lab 04/11/20 0337 04/12/20 0329 04/13/20 0331  NA 137 137 140  K 3.9 4.6 3.6  CL 98 99 100  CO2 26 23 28   GLUCOSE 95 99 107*  BUN 16 24* 10   CREATININE 4.84* 6.41* 3.90*  CALCIUM 8.9 9.4 9.4  PHOS 5.0* 6.0* 3.3   Liver Function Tests: Recent Labs  Lab 04/11/20 0337 04/12/20 0329 04/13/20 0331  ALBUMIN 2.4* 2.5* 2.5*   No results for input(s): LIPASE, AMYLASE in the last 168 hours. No results for input(s): AMMONIA in the last 168 hours. CBC: Recent Labs  Lab 04/07/20 0355 04/07/20 0355 04/08/20 1406 04/08/20 1406 04/08/20 2102 04/10/20 0435 04/12/20 0329  WBC 8.9   < > 9.6   < > 15.3* 13.7* 10.4  HGB 6.8*   < > 6.6*   < > 9.3* 9.3* 7.3*  HCT 22.8*   < > 22.5*   < > 29.8* 30.3* 24.0*  MCV 94.2  --  94.1  --  90.0 93.5 93.4  PLT 144*   < > 148*   < > 111* 198 167   < > = values in this interval not displayed.   Cardiac Enzymes: No results for input(s): CKTOTAL, CKMB, CKMBINDEX, TROPONINI in the last 168 hours. CBG: Recent Labs  Lab 04/12/20 0617 04/12/20 1127 04/12/20 1649 04/12/20 2204 04/13/20 0619  GLUCAP 75 142* 115* 133* 96    Studies/Results: No results found. Medications: . sodium chloride    . sodium chloride    . sodium chloride    . ferric gluconate (FERRLECIT/NULECIT) IV 62.5  mg (04/11/20 1111)  . magnesium sulfate bolus IVPB     . acetaminophen  650 mg Oral Q8H  . amLODipine  10 mg Oral Daily  . aspirin EC  81 mg Oral Daily  . atorvastatin  40 mg Oral Daily  . calcitRIOL  1.5 mcg Oral Q T,Th,Sa-HD  . Chlorhexidine Gluconate Cloth  6 each Topical Q0600  . Chlorhexidine Gluconate Cloth  6 each Topical Q0600  . clopidogrel  300 mg Oral Once   Followed by  . clopidogrel  75 mg Oral Q breakfast  . darbepoetin (ARANESP) injection - DIALYSIS  150 mcg Intravenous Q Wed-HD  . docusate sodium  100 mg Oral Daily  . doxercalciferol  7 mcg Intravenous Q M,W,F-HD  . gabapentin  100 mg Oral QHS  . heparin injection (subcutaneous)  5,000 Units Subcutaneous Q8H  . insulin aspart  0-15 Units Subcutaneous TID WC  . insulin aspart  0-5 Units Subcutaneous QHS  . insulin glargine  15 Units  Subcutaneous QHS  . methocarbamol  500 mg Oral BID  . metoprolol succinate  12.5 mg Oral Daily  . multivitamin  1 tablet Oral QHS  . pantoprazole  40 mg Oral Daily  . polyethylene glycol  17 g Oral Daily  . sevelamer carbonate  2,400 mg Oral TID WC  . sodium chloride flush  3 mL Intravenous Q12H

## 2020-04-14 LAB — CBC
HCT: 25.1 % — ABNORMAL LOW (ref 36.0–46.0)
Hemoglobin: 7.4 g/dL — ABNORMAL LOW (ref 12.0–15.0)
MCH: 27.9 pg (ref 26.0–34.0)
MCHC: 29.5 g/dL — ABNORMAL LOW (ref 30.0–36.0)
MCV: 94.7 fL (ref 80.0–100.0)
Platelets: 193 10*3/uL (ref 150–400)
RBC: 2.65 MIL/uL — ABNORMAL LOW (ref 3.87–5.11)
RDW: 15.8 % — ABNORMAL HIGH (ref 11.5–15.5)
WBC: 9.7 10*3/uL (ref 4.0–10.5)
nRBC: 0 % (ref 0.0–0.2)

## 2020-04-14 LAB — GLUCOSE, CAPILLARY
Glucose-Capillary: 65 mg/dL — ABNORMAL LOW (ref 70–99)
Glucose-Capillary: 87 mg/dL (ref 70–99)
Glucose-Capillary: 113 mg/dL — ABNORMAL HIGH (ref 70–99)
Glucose-Capillary: 116 mg/dL — ABNORMAL HIGH (ref 70–99)

## 2020-04-14 MED ORDER — NEPRO/CARBSTEADY PO LIQD
237.0000 mL | Freq: Two times a day (BID) | ORAL | Status: DC
  Administered 2020-04-14 – 2020-04-16 (×3): 237 mL via ORAL

## 2020-04-14 NOTE — Progress Notes (Signed)
Mobility Specialist: Progress Note   04/14/20 1439  Mobility  Activity  (Cancel)  Mobility performed by Mobility specialist   Nurse said status of pt hasn't changed today, not seeing pt today.   [facility] Khamora Karan Mobility Specialist

## 2020-04-14 NOTE — Plan of Care (Signed)
  Problem: Education: Goal: Knowledge of General Education information will improve Description Including pain rating scale, medication(s)/side effects and non-pharmacologic comfort measures Outcome: Progressing   

## 2020-04-14 NOTE — Progress Notes (Signed)
Subjective:  No complaints besides discomfort with passing the glove she ate   Objective Vital signs in last 24 hours: Vitals:   04/13/20 2348 04/14/20 0500 04/14/20 0805 04/14/20 1146  BP: (!) 153/87 (!) 150/77 (!) 150/81 (!) 143/80  Pulse: 81 90 97 94  Resp: 20 20 20 20   Temp: 98.2 F (36.8 C) 98 F (36.7 C) 98 F (36.7 C) 98.2 F (36.8 C)  TempSrc: Oral Oral Oral Oral  SpO2: 100% 100% 99% 100%  Weight:      Height:       Weight change:   Physical Exam: General: Alert appears chronically ill NAD Heart: RRR no rub Lungs: CTA nonlabored Abdomen: Obese bowel sounds normoactive soft nontender, nondistended Extremities: Right BKA surgical staples, incision dry , left BKA minimal edema  Dialysis Access: Left upper arm AV Gore-Tex graft positive bruit  Dialysis Orders:  MWF 4 hrs 180NRe 400/800 99.5 kg 2.0 K/ 2.25 Ca UFP 2 L AVG -Heparin 6000 units IV TIW -Hectorol 7 mcg IV TIW -Mircera 150 mcg IV q 2 weeks (last dose 03/22/2020 Last HGB 7.7 03/27/20)  Problem/Plan: 1.Critical limb ischemia / necrotic right heel ulcer status post sharp excisional debridement:Underwent R BKA 7/21 -Dr. Donzetta Matters.  Now off IV antibiotics/and wound care per VVS.   Awaiting SNF 2. ESRD -HD MWF. Next HD 7/ 26. 3. HTN/volume-Continue home meds/UF with HD 3 L tolerated,  below dry weight now with bed weights. 4. Anemia-yesterday Hgb 7.3 > 7.4 this a.m. got PRBC x 2 units on 7/19 with HD. with significant inflammatory complex from nonhealing heel ulcer/ongoing bleeding). Started  aranesp 150 mcg weekly on Wed on 7/21 follow-up trend 5.MBD of CKD -Continue binders phosphorus 3.3, corrected calcium over 10 we will hold Hectorol follow-up trend 6. Nutrition -Albumin 2.5 renal diet/fluid restrictions.  We will add Nepro supplement  Ernest Haber, PA-C [facility] [phone] 04/14/2020,11:53 AM  LOS: 10 days   Labs: Basic Metabolic Panel: Recent Labs  Lab  04/11/20 0337 04/12/20 0329 04/13/20 0331  NA 137 137 140  K 3.9 4.6 3.6  CL 98 99 100  CO2 26 23 28   GLUCOSE 95 99 107*  BUN 16 24* 10  CREATININE 4.84* 6.41* 3.90*  CALCIUM 8.9 9.4 9.4  PHOS 5.0* 6.0* 3.3   Liver Function Tests: Recent Labs  Lab 04/11/20 0337 04/12/20 0329 04/13/20 0331  ALBUMIN 2.4* 2.5* 2.5*   No results for input(s): LIPASE, AMYLASE in the last 168 hours. No results for input(s): AMMONIA in the last 168 hours. CBC: Recent Labs  Lab 04/08/20 1406 04/08/20 1406 04/08/20 2102 04/08/20 2102 04/10/20 0435 04/12/20 0329 04/14/20 0401  WBC 9.6   < > 15.3*   < > 13.7* 10.4 9.7  HGB 6.6*   < > 9.3*   < > 9.3* 7.3* 7.4*  HCT 22.5*   < > 29.8*   < > 30.3* 24.0* 25.1*  MCV 94.1  --  90.0  --  93.5 93.4 94.7  PLT 148*   < > 111*   < > 198 167 193   < > = values in this interval not displayed.   Cardiac Enzymes: No results for input(s): CKTOTAL, CKMB, CKMBINDEX, TROPONINI in the last 168 hours. CBG: Recent Labs  Lab 04/13/20 1154 04/13/20 1601 04/13/20 2152 04/14/20 0625 04/14/20 0655  GLUCAP 117* 131* 86 65* 87    Studies/Results: No results found. Medications: . sodium chloride    . sodium chloride    .  sodium chloride    . ferric gluconate (FERRLECIT/NULECIT) IV 62.5 mg (04/11/20 1111)  . magnesium sulfate bolus IVPB     . acetaminophen  650 mg Oral Q8H  . amLODipine  10 mg Oral Daily  . aspirin EC  81 mg Oral Daily  . atorvastatin  40 mg Oral Daily  . [START ON 04/15/2020] calcitRIOL  1.5 mcg Oral Q M,W,F-HD  . Chlorhexidine Gluconate Cloth  6 each Topical Q0600  . Chlorhexidine Gluconate Cloth  6 each Topical Q0600  . clopidogrel  300 mg Oral Once   Followed by  . clopidogrel  75 mg Oral Q breakfast  . darbepoetin (ARANESP) injection - DIALYSIS  150 mcg Intravenous Q Wed-HD  . docusate sodium  100 mg Oral Daily  . doxercalciferol  7 mcg Intravenous Q M,W,F-HD  . gabapentin  100 mg Oral QHS  . heparin injection (subcutaneous)   5,000 Units Subcutaneous Q8H  . insulin aspart  0-15 Units Subcutaneous TID WC  . insulin aspart  0-5 Units Subcutaneous QHS  . insulin glargine  15 Units Subcutaneous QHS  . methocarbamol  500 mg Oral BID  . metoprolol succinate  12.5 mg Oral Daily  . multivitamin  1 tablet Oral QHS  . pantoprazole  40 mg Oral Daily  . polyethylene glycol  17 g Oral Daily  . sevelamer carbonate  2,400 mg Oral TID WC  . sodium chloride flush  3 mL Intravenous Q12H

## 2020-04-14 NOTE — Progress Notes (Signed)
Hypoglycemic Event  CBG:65 @0625   Treatment: 1105ml of cranberry juice  Symptoms: None reported by  the patient  Follow-up CBG: Time 0657 CBG Result 87  Possible Reasons for Event: inadequate meal intake   Comments/MD notified:    Renee Rival

## 2020-04-14 NOTE — TOC Progression Note (Signed)
Transition of Care [facility]) - Progression Note    Patient Details  Name: KIMARIE COOR MRN: [id number] Date of Birth: 20-Feb-1976  Transition of Care [facility]) CM/SW [place], [place] Phone Number: [phone] 04/14/2020, 10:34 AM  Clinical Narrative:     CSW met with patient to discuss bed offers and the possibility that her HD facility may have to change due to no offers in [place].Patient's first choice was [facility] and second choice was [place] [facility]. Patient is understanding of the HD facility change.  CSW reached out to [place] about bed readiness and HD and had to leave message. A message was left for [facility] as well.   TOC team will continue to assist with discharge planning needs.  Expected Discharge Plan: Skilled Nursing Facility Barriers to Discharge: Continued Medical Work up, SNF Pending bed offer  Expected Discharge Plan and Services Expected Discharge Plan: [place]   Discharge Planning Services: CM Consult                                           Social Determinants of Health (SDOH) Interventions    Readmission Risk Interventions Readmission Risk Prevention Plan 04/04/2020  Social Work Consult for [place] Planning/Counseling Complete  Medication Review Press photographer) Complete  Some recent data might be hidden

## 2020-04-14 NOTE — Progress Notes (Addendum)
Progress Note    04/14/2020 8:21 AM 4 Days Post-Op  Subjective:  No complaints. Says she feels bad about eating the gloves.  Currently eating breakfast (bacon) without N, V or abdominal pain.   Vitals:   04/14/20 0500 04/14/20 0805  BP: (!) 150/77 (!) 150/81  Pulse: 90 97  Resp: 20 20  Temp: 98 F (36.7 C) 98 F (36.7 C)  SpO2: 100% 99%   Tm 99.1  at 0825 7/24 Physical Exam: Cardiac:  RRR Lungs:  CTAB Incisions:  Well approximated Extremities:  Right residual limb with minimal edema, no signs infection. Flaps warm and well perfused.  Left upper arm AVF with good bruit. Abdomen:  Soft, ND, NT  CBC    Component Value Date/Time   WBC 9.7 04/14/2020 0401   RBC 2.65 (L) 04/14/2020 0401   HGB 7.4 (L) 04/14/2020 0401   HCT 25.1 (L) 04/14/2020 0401   PLT 193 04/14/2020 0401   MCV 94.7 04/14/2020 0401   MCH 27.9 04/14/2020 0401   MCHC 29.5 (L) 04/14/2020 0401   RDW 15.8 (H) 04/14/2020 0401   LYMPHSABS 1.5 04/02/2019 2243   MONOABS 0.6 04/02/2019 2243   EOSABS 0.2 04/02/2019 2243   BASOSABS 0.0 04/02/2019 2243    BMET    Component Value Date/Time   NA 140 04/13/2020 0331   K 3.6 04/13/2020 0331   CL 100 04/13/2020 0331   CO2 28 04/13/2020 0331   GLUCOSE 107 (H) 04/13/2020 0331   BUN 10 04/13/2020 0331   CREATININE 3.90 (H) 04/13/2020 0331   CALCIUM 9.4 04/13/2020 0331   GFRNONAA 13 (L) 04/13/2020 0331   GFRAA 15 (L) 04/13/2020 0331     Intake/Output Summary (Last 24 hours) at 04/14/2020 0821 Last data filed at 04/14/2020 0500 Gross per 24 hour  Intake 690 ml  Output --  Net 690 ml    HOSPITAL MEDICATIONS Scheduled Meds: . acetaminophen  650 mg Oral Q8H  . amLODipine  10 mg Oral Daily  . aspirin EC  81 mg Oral Daily  . atorvastatin  40 mg Oral Daily  . [START ON 04/15/2020] calcitRIOL  1.5 mcg Oral Q M,W,F-HD  . Chlorhexidine Gluconate Cloth  6 each Topical Q0600  . Chlorhexidine Gluconate Cloth  6 each Topical Q0600  . clopidogrel  300 mg Oral Once    Followed by  . clopidogrel  75 mg Oral Q breakfast  . darbepoetin (ARANESP) injection - DIALYSIS  150 mcg Intravenous Q Wed-HD  . docusate sodium  100 mg Oral Daily  . doxercalciferol  7 mcg Intravenous Q M,W,F-HD  . gabapentin  100 mg Oral QHS  . heparin injection (subcutaneous)  5,000 Units Subcutaneous Q8H  . insulin aspart  0-15 Units Subcutaneous TID WC  . insulin aspart  0-5 Units Subcutaneous QHS  . insulin glargine  15 Units Subcutaneous QHS  . methocarbamol  500 mg Oral BID  . metoprolol succinate  12.5 mg Oral Daily  . multivitamin  1 tablet Oral QHS  . pantoprazole  40 mg Oral Daily  . polyethylene glycol  17 g Oral Daily  . sevelamer carbonate  2,400 mg Oral TID WC  . sodium chloride flush  3 mL Intravenous Q12H   Continuous Infusions: . sodium chloride    . sodium chloride    . sodium chloride    . ferric gluconate (FERRLECIT/NULECIT) IV 62.5 mg (04/11/20 1111)  . magnesium sulfate bolus IVPB     PRN Meds:.sodium chloride, sodium chloride, sodium chloride, acetaminophen **  OR** acetaminophen, diphenhydrAMINE, guaiFENesin-dextromethorphan, heparin, hydrALAZINE, HYDROmorphone (DILAUDID) injection, labetalol, lidocaine (PF), lidocaine-prilocaine, magnesium sulfate bolus IVPB, metoprolol tartrate, ondansetron (ZOFRAN) IV, ondansetron, oxyCODONE, pentafluoroprop-tetrafluoroeth, phenol, senna-docusate, sodium chloride flush  Assessment:    POD 4   Status post right below-knee amputation for nonsalvageable limb. She has had no fevers since amputation. Now off Vancomycin x 24+ hours  Plan: Continue current treatment plan I don't see a TOC team note as of 7/21. Will place to call to see what progress has been made. DVT prophy: heparin [place]  Risa Grill, PA-C [facility] [phone] 04/14/2020  8:21 AM   I have seen and evaluated the patient. I agree with the PA note as documented above.  44 year old female status post right BKA for nonsalvageable limb.   Incision looks great.  Awaiting SNF placement.  No fevers and antibiotics were continued 48 hours postop.  Marty Heck, MD [facility] [place] Office: [phone]

## 2020-04-15 LAB — CBC
HCT: 27.4 % — ABNORMAL LOW (ref 36.0–46.0)
HCT: 25.8 % — ABNORMAL LOW (ref 36.0–46.0)
Hemoglobin: 8.2 g/dL — ABNORMAL LOW (ref 12.0–15.0)
Hemoglobin: 7.6 g/dL — ABNORMAL LOW (ref 12.0–15.0)
MCH: 28.5 pg (ref 26.0–34.0)
MCH: 28 pg (ref 26.0–34.0)
MCHC: 29.9 g/dL — ABNORMAL LOW (ref 30.0–36.0)
MCHC: 29.5 g/dL — ABNORMAL LOW (ref 30.0–36.0)
MCV: 95.1 fL (ref 80.0–100.0)
MCV: 95.2 fL (ref 80.0–100.0)
Platelets: 206 10*3/uL (ref 150–400)
Platelets: 196 10*3/uL (ref 150–400)
RBC: 2.88 MIL/uL — ABNORMAL LOW (ref 3.87–5.11)
RBC: 2.71 MIL/uL — ABNORMAL LOW (ref 3.87–5.11)
RDW: 15.9 % — ABNORMAL HIGH (ref 11.5–15.5)
RDW: 15.8 % — ABNORMAL HIGH (ref 11.5–15.5)
WBC: 9.1 10*3/uL (ref 4.0–10.5)
WBC: 9.6 10*3/uL (ref 4.0–10.5)
nRBC: 0 % (ref 0.0–0.2)
nRBC: 0 % (ref 0.0–0.2)

## 2020-04-15 LAB — RENAL FUNCTION PANEL
Albumin: 2.3 g/dL — ABNORMAL LOW (ref 3.5–5.0)
Albumin: 2.3 g/dL — ABNORMAL LOW (ref 3.5–5.0)
Anion gap: 14 (ref 5–15)
Anion gap: 12 (ref 5–15)
BUN: 26 mg/dL — ABNORMAL HIGH (ref 6–20)
BUN: 26 mg/dL — ABNORMAL HIGH (ref 6–20)
CO2: 24 mmol/L (ref 22–32)
CO2: 26 mmol/L (ref 22–32)
Calcium: 9.9 mg/dL (ref 8.9–10.3)
Calcium: 9.9 mg/dL (ref 8.9–10.3)
Chloride: 97 mmol/L — ABNORMAL LOW (ref 98–111)
Chloride: 98 mmol/L (ref 98–111)
Creatinine, Ser: 8.04 mg/dL — ABNORMAL HIGH (ref 0.44–1.00)
Creatinine, Ser: 8.19 mg/dL — ABNORMAL HIGH (ref 0.44–1.00)
GFR calc Af Amer: 6 mL/min — ABNORMAL LOW (ref >60)
GFR calc Af Amer: 6 mL/min — ABNORMAL LOW (ref >60)
GFR calc non Af Amer: 6 mL/min — ABNORMAL LOW (ref >60)
GFR calc non Af Amer: 5 mL/min — ABNORMAL LOW (ref >60)
Glucose, Bld: 96 mg/dL (ref 70–99)
Glucose, Bld: 113 mg/dL — ABNORMAL HIGH (ref 70–99)
Phosphorus: 4.9 mg/dL — ABNORMAL HIGH (ref 2.5–4.6)
Phosphorus: 5.1 mg/dL — ABNORMAL HIGH (ref 2.5–4.6)
Potassium: 3.7 mmol/L (ref 3.5–5.1)
Potassium: 3.9 mmol/L (ref 3.5–5.1)
Sodium: 135 mmol/L (ref 135–145)
Sodium: 136 mmol/L (ref 135–145)

## 2020-04-15 LAB — GLUCOSE, CAPILLARY
Glucose-Capillary: 153 mg/dL — ABNORMAL HIGH (ref 70–99)
Glucose-Capillary: 85 mg/dL (ref 70–99)
Glucose-Capillary: 105 mg/dL — ABNORMAL HIGH (ref 70–99)
Glucose-Capillary: 96 mg/dL (ref 70–99)
Glucose-Capillary: 113 mg/dL — ABNORMAL HIGH (ref 70–99)
Glucose-Capillary: 137 mg/dL — ABNORMAL HIGH (ref 70–99)

## 2020-04-15 MED ORDER — KETOROLAC TROMETHAMINE 15 MG/ML IJ SOLN
30.0000 mg | Freq: Once | INTRAMUSCULAR | Status: DC
  Filled 2020-04-15: qty 2

## 2020-04-15 MED ORDER — HYDROMORPHONE HCL 1 MG/ML IJ SOLN
1.0000 mg | Freq: Three times a day (TID) | INTRAMUSCULAR | Status: DC | PRN
  Filled 2020-04-15: qty 1

## 2020-04-15 MED ORDER — HYDROMORPHONE HCL 1 MG/ML IJ SOLN
INTRAMUSCULAR | Status: AC
  Filled 2020-04-15: qty 1

## 2020-04-15 MED ORDER — HEPARIN SODIUM (PORCINE) 1000 UNIT/ML IJ SOLN
INTRAMUSCULAR | Status: AC
  Administered 2020-04-15: 1000 [IU]
  Filled 2020-04-15: qty 2

## 2020-04-15 NOTE — Progress Notes (Signed)
Physical Therapy Treatment Patient Details Name: Karen [name] MRN: [id number] DOB: [birth year] Today's Date: 04/15/2020    History of Present Illness Pt is a 44 y.o. female admitted 04/03/20 with right heel wound s/[name] R proximal SFA angioplasty on 7/14 and debridement of R heel 7/15. S/[name] R BKA 7/21. 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    PT Comments    Pt awake on arrival with maintained dysarthria with difficulty understanding all of her statements. Pt initially stating she was tired but willing to participate with encouragement to EOB but declined OOB to chair. Pt is able to be lifted to chair with nursing with prosthesis on. Pt fearful of falling EOB and educated for safety and balance with pt more comfortable once prosthesis donned for her in sitting. Pt educated for progression and continued need to maximize mobility to improve function and quality of life.    Follow Up Recommendations  SNF;Supervision/Assistance - 24 hour     Equipment Recommendations  None recommended by PT    Recommendations for Other Services       Precautions / Restrictions Precautions Precautions: Fall;Other (comment) Precaution Comments: Bilateral BKA (LLE prosthetic in room)    Mobility  Bed Mobility Overal bed mobility: Needs Assistance Bed Mobility: Supine to Sit;Sit to Supine     Supine to sit: Max assist;HOB elevated Sit to supine: Mod assist;+2 for physical assistance   General bed mobility comments: max assist with HOB 50degrees and use of rail with sequential cues to pivot to left side of bed. Pt able to scoot toward [facility] with cued and mod +2 assist toward [facility] with min +2 to fully pivot legs back into bed. Min +2 with bil rails to slide toward HOB.  Transfers                 General transfer comment: pt denied attempting stating she did not want to sit in chair despite encouragement  Ambulation/Gait              General Gait Details: unable   Stairs             Wheelchair Mobility    Modified Rankin (Stroke Patients Only)       Balance Overall balance assessment: Needs assistance Sitting-balance support: Bilateral upper extremity supported Sitting balance-Leahy Scale: Poor Sitting balance - Comments: pt progressed from min assist to minguard with pt able to sit EOB 8 min and work on lateral scooting EOB                                    Cognition Arousal/Alertness: Awake/alert Behavior During Therapy: Flat affect Overall Cognitive Status: No family/caregiver present to determine baseline cognitive functioning Area of Impairment: Memory;Following commands;Awareness;Safety/judgement;Problem solving;Attention;Orientation                 Orientation Level: Disoriented to;Time Current Attention Level: Sustained Memory: Decreased short-term memory Following Commands: Follows one step commands inconsistently;Follows one step commands with increased time Safety/Judgement: Decreased awareness of safety;Decreased awareness of deficits Awareness: Intellectual Problem Solving: Slow processing;Requires verbal cues;Requires tactile cues;Decreased initiation;Difficulty sequencing General Comments: pt reports she has to "think about it" when given cues and has extensive 1-2 min delay between cues and initiating movement      Exercises General Exercises - Lower Extremity Straight Leg Raises: AAROM;Right;Supine;10 reps    General Comments  Pertinent Vitals/Pain Pain Assessment: No/denies pain    Home Living                      Prior Function            PT Goals (current goals can now be found in the care plan section) Progress towards PT goals: Progressing toward goals    Frequency    Min 2X/week      PT Plan Current plan remains appropriate    Co-evaluation              AM-PAC PT "6 Clicks" Mobility   Outcome Measure   Help needed turning from your back to your side while in a flat bed without using bedrails?: A Lot Help needed moving from lying on your back to sitting on the side of a flat bed without using bedrails?: Total Help needed moving to and from a bed to a chair (including a wheelchair)?: Total Help needed standing up from a chair using your arms (e.g., wheelchair or bedside chair)?: Total Help needed to walk in hospital room?: Total Help needed climbing 3-5 steps with a railing? : Total 6 Click Score: 7    End of Session Equipment Utilized During Treatment: Oxygen Activity Tolerance: Patient tolerated treatment well Patient left: in bed;with call bell/phone within reach;with bed alarm set Nurse Communication: Mobility status;Need for lift equipment PT Visit Diagnosis: Muscle weakness (generalized) (M62.81);Unsteadiness on feet (R26.81);Other abnormalities of gait and mobility (R26.89)     Time: 6387-5643 PT Time Calculation (min) (ACUTE ONLY): 24 min  Charges:  $Therapeutic Activity: 23-37 mins                     Karen [name], PT Acute Rehabilitation Services Pager: [phone] Office: [place] Karen [name] 04/15/2020, 10:04 AM

## 2020-04-15 NOTE — Progress Notes (Signed)
Patient ID: Anira Senegal Buffone, female   DOB: 1976-04-07, 44 y.o.   MRN: [id number]  This NP visited patient at the bedside as a follow up for palliative medicine needs and emotional support.  Patient is more alert and engaged today.  She reports better pain control, and anticipates disposition to skilled nursing facility for rehab when medically stable and bed becomes available. Utilization of oral medications only for pain control in preparation for transition to skilled nursing facility.  Education and motivation offered regarding the importance of her participation in physical therapy at the rehab, ultimately Kenney Houseman hopes to get home with her family.  Discussed with patient the importance of continued conversation with her family and H POA,   and her medical providers regarding overall plan of care and treatment options,  ensuring decisions are within the context of the patients values and GOCs.  Questions and concerns addressed     PMT will sign off at this time.  Please reconsult if needs arise prior to discharge.  Discussed with transition of care team,    recommend outpatient community-based palliative to follow patient on discharge.  Total time spent on the unit was 15 minutes  Greater than 50% of the time was spent in counseling and coordination of care  Wadie Lessen NP  Palliative Medicine Team Team Phone # [phone] Pager [phone]

## 2020-04-15 NOTE — Progress Notes (Signed)
Patient ID: Karen [name], female   DOB: 1976-06-16, 44 y.o.   MRN: [id number] [facility] Progress Note   Assessment/ Plan:   1. 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  Now afebrile and with normal leukocyte count off antibiotics.  Awaiting SNF admission for continued PT/OT. 2. ESRD -continue hemodialysis on a Monday/Wednesday/Friday schedule with next dialysis ordered for later today. 3. Hypertension/volume -she appears to be close to euvolemic on physical exam and has been normotensive.  Continue to monitor with hemodialysis/ultrafiltration. 4. Anemia-with low hemoglobin and hematocrit postoperatively, continue ESA with dialysis. 5. Metabolic bone disease -calcium and phosphorus levels both at goal, continue sevelamer.  Hectorol discontinued because of hypercalcemia. 6. Nutrition -continue renal diet with fluid restriction and ongoing ONS.   Subjective:   Without acute events overnight.  She reports that she is tired this morning from physical therapy.  Appears surprised when I told her that she will get dialysis later today.   Objective:   BP (!) 143/86 (BP Location: Right Arm)   Pulse 83   Temp 98 F (36.7 C) (Oral)   Resp 13   Ht 5' 5.98" (1.676 m)   Wt (!) 96.4 kg   SpO2 98%   BMI 34.32 kg/m   Intake/Output Summary (Last 24 hours) at 04/15/2020 3976 Last data filed at 04/14/2020 1500 Gross per 24 hour  Intake 240 ml  Output --  Net 240 ml   Weight change:   Physical Exam: Gen: Comfortably resting in bed, appears tired/lethargic-RN at bedside. CVS: Pulse regular rhythm, normal rate, S1 and S2 normal Resp: Clear to auscultation, no rales/rhonchi Abd: Soft, flat, nontender Ext: Right BKA stump intact staples with  trace edema.  Left upper arm AV graft with bruit.  Imaging: No results found.  Labs: BMET Recent Labs  Lab 04/08/20 1405 04/09/20 1342 04/10/20 0435 04/11/20 0337 04/12/20 0329 04/13/20 0331  NA 136 136 135 137 137 140  K 4.7 4.3 4.2 3.9 4.6 3.6  CL 96* 96* 96* 98 99 100  CO2 24 27 24 26 23 28   GLUCOSE 92 90 120* 95 99 107*  BUN 38* 25* 31* 16 24* 10  CREATININE 8.05* 6.51* 7.91* 4.84* 6.41* 3.90*  CALCIUM 8.5* 8.7* 9.1 8.9 9.4 9.4  PHOS 6.6* 5.9* 6.9* 5.0* 6.0* 3.3   CBC Recent Labs  Lab 04/08/20 2102 04/10/20 0435 04/12/20 0329 04/14/20 0401  WBC 15.3* 13.7* 10.4 9.7  HGB 9.3* 9.3* 7.3* 7.4*  HCT 29.8* 30.3* 24.0* 25.1*  MCV 90.0 93.5 93.4 94.7  PLT 111* 198 167 193   Medications:    . acetaminophen  650 mg Oral Q8H  . amLODipine  10 mg Oral Daily  . aspirin EC  81 mg Oral Daily  . atorvastatin  40 mg Oral Daily  . Chlorhexidine Gluconate Cloth  6 each Topical Q0600  . clopidogrel  300 mg Oral Once   Followed by  . clopidogrel  75 mg Oral Q breakfast  . darbepoetin (ARANESP) injection - DIALYSIS  150 mcg Intravenous Q Wed-HD  . docusate sodium  100 mg Oral Daily  . feeding supplement (NEPRO CARB STEADY)  237 mL Oral BID BM  . gabapentin  100 mg Oral QHS  . heparin injection (subcutaneous)  5,000 Units Subcutaneous Q8H  . insulin  aspart  0-15 Units Subcutaneous TID WC  . insulin aspart  0-5 Units Subcutaneous QHS  . insulin glargine  15 Units Subcutaneous QHS  . methocarbamol  500 mg Oral BID  . metoprolol succinate  12.5 mg Oral Daily  . multivitamin  1 tablet Oral QHS  . pantoprazole  40 mg Oral Daily  . polyethylene glycol  17 g Oral Daily  . sevelamer carbonate  2,400 mg Oral TID WC  . sodium chloride flush  3 mL Intravenous Q12H   Elmarie Shiley, MD 04/15/2020, 9:58 AM

## 2020-04-15 NOTE — Progress Notes (Addendum)
Progress Note    04/15/2020 7:35 AM 5 Days Post-Op  Subjective: Sleeping this morning, awakens easily.  No complaints   Vitals:   04/14/20 2338 04/15/20 0540  BP: (!) 158/76 (!) 154/82  Pulse: 88 91  Resp: 21 20  Temp: 98.4 F (36.9 C) 98.4 F (36.9 C)  SpO2: 100% 100%    Physical Exam: Cardiac: Heart rate and rhythm are regular Lungs: Clear to auscultation bilaterally Incisions: Right BKA incision is well approximated without drainage or signs of infection Extremities: Right BKA flaps are warm and well perfused   CBC    Component Value Date/Time   WBC 9.7 04/14/2020 0401   RBC 2.65 (L) 04/14/2020 0401   HGB 7.4 (L) 04/14/2020 0401   HCT 25.1 (L) 04/14/2020 0401   PLT 193 04/14/2020 0401   MCV 94.7 04/14/2020 0401   MCH 27.9 04/14/2020 0401   MCHC 29.5 (L) 04/14/2020 0401   RDW 15.8 (H) 04/14/2020 0401   LYMPHSABS 1.5 04/02/2019 2243   MONOABS 0.6 04/02/2019 2243   EOSABS 0.2 04/02/2019 2243   BASOSABS 0.0 04/02/2019 2243    BMET    Component Value Date/Time   NA 140 04/13/2020 0331   K 3.6 04/13/2020 0331   CL 100 04/13/2020 0331   CO2 28 04/13/2020 0331   GLUCOSE 107 (H) 04/13/2020 0331   BUN 10 04/13/2020 0331   CREATININE 3.90 (H) 04/13/2020 0331   CALCIUM 9.4 04/13/2020 0331   GFRNONAA 13 (L) 04/13/2020 0331   GFRAA 15 (L) 04/13/2020 0331     Intake/Output Summary (Last 24 hours) at 04/15/2020 0735 Last data filed at 04/14/2020 1500 Gross per 24 hour  Intake 240 ml  Output --  Net 240 ml    HOSPITAL MEDICATIONS Scheduled Meds: . acetaminophen  650 mg Oral Q8H  . amLODipine  10 mg Oral Daily  . aspirin EC  81 mg Oral Daily  . atorvastatin  40 mg Oral Daily  . Chlorhexidine Gluconate Cloth  6 each Topical Q0600  . clopidogrel  300 mg Oral Once   Followed by  . clopidogrel  75 mg Oral Q breakfast  . darbepoetin (ARANESP) injection - DIALYSIS  150 mcg Intravenous Q Wed-HD  . docusate sodium  100 mg Oral Daily  . feeding supplement  (NEPRO CARB STEADY)  237 mL Oral BID BM  . gabapentin  100 mg Oral QHS  . heparin injection (subcutaneous)  5,000 Units Subcutaneous Q8H  . insulin aspart  0-15 Units Subcutaneous TID WC  . insulin aspart  0-5 Units Subcutaneous QHS  . insulin glargine  15 Units Subcutaneous QHS  . methocarbamol  500 mg Oral BID  . metoprolol succinate  12.5 mg Oral Daily  . multivitamin  1 tablet Oral QHS  . pantoprazole  40 mg Oral Daily  . polyethylene glycol  17 g Oral Daily  . sevelamer carbonate  2,400 mg Oral TID WC  . sodium chloride flush  3 mL Intravenous Q12H   Continuous Infusions: . sodium chloride    . sodium chloride    . sodium chloride    . ferric gluconate (FERRLECIT/NULECIT) IV 62.5 mg (04/11/20 1111)  . magnesium sulfate bolus IVPB     PRN Meds:.sodium chloride, sodium chloride, sodium chloride, acetaminophen **OR** acetaminophen, diphenhydrAMINE, guaiFENesin-dextromethorphan, heparin, hydrALAZINE, HYDROmorphone (DILAUDID) injection, labetalol, lidocaine (PF), lidocaine-prilocaine, magnesium sulfate bolus IVPB, metoprolol tartrate, ondansetron (ZOFRAN) IV, ondansetron, oxyCODONE, pentafluoroprop-tetrafluoroeth, phenol, senna-docusate, sodium chloride flush  Assessment: POD 5Status post right below-knee amputation for nonsalvageable limb.  She has had no fevers since amputation. No signs stable.  Remains afebrile   Plan: -Continue PT/OT. -Awaiting follow-up from transition of care team.  I connected with him yesterday regarding updated information for SNF placement.  Aspirin, statin and Plavix continue    Risa Grill, PA-C [facility] [phone] 04/15/2020  7:35 AM   I have seen and evaluated the patient. I agree with the PA note as documented above.  Right BKA continues to look good.  Awaiting SNF placement.  Stable for discharge from my standpoint.  Remains afebrile.  Marty Heck, MD [facility] [place] Office:  [phone]

## 2020-04-15 NOTE — Progress Notes (Signed)
Renal Navigator has requested temporary transfer of patient's OP HD from [place] [facility] to [facility] to accommodate SNF bed offer in [facility]). Per CSW/C. Johnson, patient did not receive any offers in the [place] area that would allow her to remain at her current OP HD clinic. Navigator will follow closely.  Alphonzo Cruise, [place] Renal Navigator [phone]

## 2020-04-16 LAB — GLUCOSE, CAPILLARY
Glucose-Capillary: 117 mg/dL — ABNORMAL HIGH (ref 70–99)
Glucose-Capillary: 136 mg/dL — ABNORMAL HIGH (ref 70–99)
Glucose-Capillary: 135 mg/dL — ABNORMAL HIGH (ref 70–99)
Glucose-Capillary: 127 mg/dL — ABNORMAL HIGH (ref 70–99)
Glucose-Capillary: 122 mg/dL — ABNORMAL HIGH (ref 70–99)

## 2020-04-16 LAB — SARS CORONAVIRUS 2 (TAT 6-24 HRS): SARS Coronavirus 2: NEGATIVE

## 2020-04-16 MED ORDER — CLOPIDOGREL BISULFATE 75 MG PO TABS: 75.0000 mg | ORAL_TABLET | Freq: Every day | ORAL | 1 refills | Status: DC

## 2020-04-16 NOTE — Consult Note (Signed)
   [facility] CM Inpatient Consult   04/16/2020  Karen [name] 1976-01-16 [id number]   Patient screened for high risk score for unplanned readmission. Chart reviewed to assess for potential [facility] Management service needs. Per review, patient is being recommended for a skilled nursing facility level of care.    No [facility] Care Management follow up needs at this time is planned if patient goes to a SNF as care needs would be met at that level of care.    Karen Cedars, MSN, [facility] Liaison Nurse Mobile Phone [phone]  Toll free office [phone]

## 2020-04-16 NOTE — Progress Notes (Signed)
Renal Navigator has secured a TTS seat for patient at [facility] with a time of 7:20am. She needs to arrive at her appointments at 7:00am. Navigator informed CSW/C. Johnson. Navigator spoke with Nephrologist/Dr. Posey Pronto to inform. He reports that patient can do HD schedule of Monday, Thursday, Saturday to switch to new OP HD schedule.   Alphonzo Cruise, [place] Renal Navigator  [phone]

## 2020-04-16 NOTE — Progress Notes (Signed)
Patient ID: Karen [name], female   DOB: 04-Nov-1975, 44 y.o.   MRN: [id number]  [facility] Progress Note   Assessment/ Plan:   1. Critical limb ischemia with necrotic right heel ulcer status post right below-knee amputation on 04/10/2020: Failed cardiac conservative measures while she was refusing BKA.  Now afebrile and with normal leukocyte count off antibiotics.  Awaiting SNF admission likely here in [place] (will require transient dialysis unit spot here) for continued PT/OT.  Pain control appears satisfactory. 2. ESRD -continue hemodialysis on a Monday/Wednesday/Friday schedule with next dialysis ordered for tomorrow. 3. Hypertension/volume -she appears to be close to euvolemic on physical exam and has been normotensive.  Continue to monitor with hemodialysis/ultrafiltration. 4. Anemia-with low hemoglobin and hematocrit postoperatively, continue ESA with dialysis. 5. Metabolic bone disease -calcium and phosphorus levels both at goal, continue sevelamer.  Hectorol discontinued because of hypercalcemia. 6. Nutrition -continue renal diet with fluid restriction and ongoing ONS.   Subjective:   Had some problems with pain/discomfort in dialysis yesterday that improved rapidly with Dilaudid.  Currently complains of constipation.   Objective:   BP 126/71 (BP Location: Right Arm)   Pulse 88   Temp 98.1 F (36.7 C) (Oral)   Resp 23   Ht 5' 5.98" (1.676 m)   Wt (!) 98.3 kg   SpO2 100%   BMI 35.00 kg/m   Intake/Output Summary (Last 24 hours) at 04/16/2020 0935 Last data filed at 04/15/2020 1110 Gross per 24 hour  Intake 120 ml  Output --  Net 120 ml   Weight change:   Physical Exam: Gen: Comfortably resting in bed, somnolent but awakens easily CVS: Pulse regular rhythm, normal rate, S1 and S2 normal Resp: Clear to auscultation, no rales/rhonchi Abd: Soft, flat, nontender Ext: Right BKA stump intact staples with trace edema.  Left BKA stump intact.  Left upper arm AV  graft with bruit.  Imaging: No results found.  Labs: BMET Recent Labs  Lab 04/09/20 1342 04/10/20 0435 04/11/20 0337 04/12/20 0329 04/13/20 0331 04/15/20 1041 04/15/20 1225  NA 136 135 137 137 140 135 136  K 4.3 4.2 3.9 4.6 3.6 3.7 3.9  CL 96* 96* 98 99 100 97* 98  CO2 27 24 26 23 28 24 26   GLUCOSE 90 120* 95 99 107* 96 113*  BUN 25* 31* 16 24* 10 26* 26*  CREATININE 6.51* 7.91* 4.84* 6.41* 3.90* 8.04* 8.19*  CALCIUM 8.7* 9.1 8.9 9.4 9.4 9.9 9.9  PHOS 5.9* 6.9* 5.0* 6.0* 3.3 4.9* 5.1*   CBC Recent Labs  Lab 04/12/20 0329 04/14/20 0401 04/15/20 1041 04/15/20 1225  WBC 10.4 9.7 9.1 9.6  HGB 7.3* 7.4* 8.2* 7.6*  HCT 24.0* 25.1* 27.4* 25.8*  MCV 93.4 94.7 95.1 95.2  PLT 167 193 206 196   Medications:    . acetaminophen  650 mg Oral Q8H  . amLODipine  10 mg Oral Daily  . aspirin EC  81 mg Oral Daily  . atorvastatin  40 mg Oral Daily  . Chlorhexidine Gluconate Cloth  6 each Topical Q0600  . clopidogrel  300 mg Oral Once   Followed by  . clopidogrel  75 mg Oral Q breakfast  . darbepoetin (ARANESP) injection - DIALYSIS  150 mcg Intravenous Q Wed-HD  . docusate sodium  100 mg Oral Daily  . feeding supplement (NEPRO CARB STEADY)  237 mL Oral BID BM  . gabapentin  100 mg Oral QHS  . heparin injection (subcutaneous)  5,000 Units Subcutaneous Q8H  .  insulin aspart  0-15 Units Subcutaneous TID WC  . insulin aspart  0-5 Units Subcutaneous QHS  . insulin glargine  15 Units Subcutaneous QHS  . ketorolac  30 mg Intravenous Once  . methocarbamol  500 mg Oral BID  . metoprolol succinate  12.5 mg Oral Daily  . multivitamin  1 tablet Oral QHS  . pantoprazole  40 mg Oral Daily  . polyethylene glycol  17 g Oral Daily  . sevelamer carbonate  2,400 mg Oral TID WC  . sodium chloride flush  3 mL Intravenous Q12H   Elmarie Shiley, MD 04/16/2020, 9:35 AM

## 2020-04-16 NOTE — Progress Notes (Signed)
Occupational Therapy Treatment Patient Details Name: Karen [name] MRN: [id number] DOB: Dec 13, 1975 Today's Date: 04/16/2020    History of present illness Pt is a 44 y.o. female admitted 04/03/20 with right heel wound s/p R proximal SFA angioplasty on 7/14 and debridement of R heel 7/15. S/p R BKA 7/21. 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   OT comments  Pt making slow progress towards OT goals. Use of bed egress to chair position for session completion today. Pt able to use bil UE for pulling trunk away from [facility], completing x5 and holding 15-30 sec each bout. Pt continues to require external assist for unsupported sitting. She was able to perform basic ADL including self-feeding and grooming while in seated position with setup/supervision throughout. Pt increasingly lethargic as session progressed and with decreased participation due to fatigue. VSS throughout. Continue to recommend SNF level therapies at time of discharge. Acute OT to follow.    Follow Up Recommendations  SNF;Supervision/Assistance - 24 hour    Equipment Recommendations  Other (comment) (defer to next venue)          Precautions / Restrictions Precautions Precautions: Fall;Other (comment) Precaution Comments: Bilateral BKA (LLE prosthetic in room) Restrictions RLE Weight Bearing: Non weight bearing       Mobility Bed Mobility Overal bed mobility: Needs Assistance             General bed mobility comments: bed egress to chair position, pt able to pull trunk away from [facility] using bil UE, requiring modA to maintain unsupported position when fatigued, completed x5  Transfers                 General transfer comment: not attempted     Balance Overall balance assessment: Needs assistance Sitting-balance support: Bilateral upper extremity supported Sitting balance-Leahy Scale: Poor                                     ADL  either performed or assessed with clinical judgement   ADL Overall ADL's : Needs assistance/impaired Eating/Feeding: Set up;Supervision/ safety;Sitting Eating/Feeding Details (indicate cue type and reason): pt requiring increased time/effort but able to hold applesauce cup to take few bites  Grooming: Wash/dry face;Set up;Supervision/safety;Sitting Grooming Details (indicate cue type and reason): supported sitting                                General ADL Comments: pt continues to have limitations due to decreased endurance, weakness, and impaired cognition                        Cognition Arousal/Alertness: Awake/alert;Lethargic (lethargic as session progressed ) Behavior During Therapy: Flat affect Overall Cognitive Status: No family/caregiver present to determine baseline cognitive functioning Area of Impairment: Memory;Following commands;Awareness;Safety/judgement;Problem solving;Attention;Orientation                   Current Attention Level: Sustained Memory: Decreased short-term memory Following Commands: Follows one step commands inconsistently;Follows one step commands with increased time Safety/Judgement: Decreased awareness of safety;Decreased awareness of deficits Awareness: Intellectual Problem Solving: Slow processing;Requires verbal cues;Requires tactile cues;Decreased initiation;Difficulty sequencing General Comments: requires redirection during session as pt easily distracted         Exercises Exercises: Other exercises;Amputee General Exercises - Lower Extremity Long  Arc Karen [name];Both;10 reps Amputee Exercises Knee Extension: AAROM;Right;5 reps (attempted x10 but pt too lethargic) Other Exercises Other Exercises: bil UE chest press, x10 seated    Shoulder Instructions       General Comments VSS    Pertinent Vitals/ Pain       Pain Assessment: Faces Faces Pain Scale: Hurts even more Pain Location: bottom Pain Descriptors  / Indicators: Discomfort;Sore Pain Intervention(s): Monitored during session;Repositioned;RN gave pain meds during session  Home Living                                          Prior Functioning/Environment              Frequency  Min 2X/week        Progress Toward Goals  OT Goals(current goals can now be found in the care plan section)  Progress towards OT goals: Progressing toward goals  Acute Rehab OT Goals Patient Stated Goal: pt ready to go to rehab OT Goal Formulation: With patient Time For Goal Achievement: 04/25/20 Potential to Achieve Goals: Good ADL Goals Pt Will Perform Grooming: with set-up;sitting Pt Will Perform Lower Body Bathing: with min assist;sitting/lateral leans;bed level Pt Will Perform Lower Body Dressing: with min assist;bed level;sitting/lateral leans Pt Will Transfer to Toilet: with min assist;with transfer board;anterior/posterior transfer;bedside commode Pt Will Perform Toileting - Clothing Manipulation and hygiene: with min assist;sitting/lateral leans;bed level Additional ADL Goal #1: Patient will follow 1-step verbal commands with 75% accuracy in prep for BADLs and functional transfers.  Plan Discharge plan remains appropriate    Co-evaluation                 AM-PAC OT "6 Clicks" Daily Activity     Outcome Measure   Help from another person eating meals?: A Little Help from another person taking care of personal grooming?: A Lot Help from another person toileting, which includes using toliet, bedpan, or urinal?: Total Help from another person bathing (including washing, rinsing, drying)?: A Lot Help from another person to put on and taking off regular upper body clothing?: A Lot Help from another person to put on and taking off regular lower body clothing?: Total 6 Click Score: 11    End of Session Equipment Utilized During Treatment: Oxygen  OT Visit Diagnosis: Muscle weakness (generalized)  (M62.81);Cognitive communication deficit (R41.841);Pain Symptoms and signs involving cognitive functions: Cerebral infarction Pain - Right/Left: Right Pain - part of body: Leg   Activity Tolerance Patient tolerated treatment well;Patient limited by lethargy   Patient Left in bed;with call bell/phone within reach;with bed alarm set   Nurse Communication Mobility status        Time: 9379-0240 OT Time Calculation (min): 26 min  Charges: OT General Charges $OT Visit: 1 Visit OT Treatments $Self Care/Home Management : 8-22 mins $Therapeutic Activity: 8-22 mins  Lou Cal, OT Acute Rehabilitation Services Pager [phone] Office [phone]   Raymondo Band 04/16/2020, 3:22 PM

## 2020-04-16 NOTE — Care Management Important Message (Signed)
Important Message  Patient Details  Name: Karen [name] MRN: [id number] Date of Birth: 10-19-1975   Medicare Important Message Given:  Yes     Shelda Altes 04/16/2020, 10:16 AM

## 2020-04-16 NOTE — TOC Progression Note (Addendum)
Transition of Care [facility]) - Progression Note    Patient Details  Name: Karen [name] MRN: [id number] Date of Birth: 04-25-1976  Transition of Care [facility]) CM/SW [place], [place] Phone Number: 04/16/2020, 4:30 PM  Clinical Narrative:     [place]  # [phone]- per pharmacy, Dr. Shelba Flake in April prescribe pain meds/narcortic. In June , script was written by [facility].   CSW called Shelba Flake office , which was [facility] [phone] Naida Sleight reported, MD no longer with their agency. She informed CSW patient had her first appointment in April, no show for May, June  and July appointments and the patient has not reached out for another appointment. She is not considered an establish patient with their clinic.    RN updated- patient will need script if pain medication(narcotic) is need at SNF, she is not connected to any pain clinic.  CSW also informed SNF([facility]) -to be cautious- patient asking for 2 small exam gloves, that we later discovered she was consuming.   Thurmond Butts, MSW, LCSWA Clinical Social Worker    Expected Discharge Plan: Skilled Nursing Facility Barriers to Discharge: Continued Medical Work up, SNF Pending bed offer  Expected Discharge Plan and Services Expected Discharge Plan: [place]   Discharge Planning Services: CM Consult     Expected Discharge Date: 04/16/20                                     Social Determinants of Health ([place]) Interventions    Readmission Risk Interventions Readmission Risk Prevention Plan 04/04/2020  Social Work Consult for [place] Planning/Counseling Complete  Medication Review Press photographer) Complete  Some recent data might be hidden

## 2020-04-16 NOTE — Discharge Summary (Signed)
Discharge Summary    Karen [name] 02/05/1976 44 y.o. female  [id number]  Admission Date: 04/03/2020  Discharge Date: 04/17/2020  Physician: Marty Heck, MD  Admission Diagnosis: PAD (peripheral artery disease) [facility]) [I73.9]   HPI:   This is a 44 y.o. female with history of end-stage renal disease on hemodialysis Monday Wednesday Friday,HTN, L BKA, PAD, ischemiccardiomyopathy, diabetes, previous stroke with some ongoing dysphagia the presents for evaluation of right heel wound. Patient states this developed about 2 and half weeks ago and ultimately she was referred here by her podiatrist. She has a history of a left BKA. She denies any previous interventions in the right leg. She states she does stand and transfer with a left leg prosthesis. Has noted a smell withherright heel wound.  Hospital Course:  The patient was admitted to the hospital and taken to the [facility] lab on 04/03/2020 and underwent: 1.  Ultrasound-guided access of left common femoral artery 2.  Aortogram including catheter selection of aorta 3.  Right lower extremity arteriogram with selection of third order branches 4.  Right proximal SFA angioplasty (5 mm x 60 mm Jade angioplasty baloon)  5.  39 minutes monitored moderate conscious sedation time  Findings:   Aortogram showed no flow-limiting stenosis in the aortoiliac segment.    Ultimately the bifurcation was crossed and right leg arteriogram was performed given the patient was very uncooperative during the procedure.  Patient had a patent common femoral and profunda with a proximal SFA chronic total occlusion with a short stump.  The remainder of the SFA was patent and diffusely diseased but no flow-limiting stenosis.  The popliteal artery was patent and two-vessel runoff through the peroneal and posterior tibial artery.  Ultimately the right SFA proximal occlusion was crossed and balloon angioplasty was performed of the proximal SFA with a 5 mm  x 60 mm Jade balloon.  She now has inline flow down the right lower extremity.  Patient was very uncooperative throughout the case and moving and made intervention difficult.  Nephrology was consulted for HD.   Pt was taken to the operating room on 04/04/2020 and underwent: Sharp excisional debridement of right heel skin and soft tissue to 7 x 5 cm  Findings: There was superficial necrosis throughout most of the heel 1 area likely tracks down to the calcaneus.  There was good capillary bleeding throughout the wound bed.  There was no purulence identified.    The pt tolerated the procedure well and was transported to the PACU in good condition.   On POD 1, the wound looked ok but still at high risk for amputation.    POD 2, dressing removed and wound has foul odor and heel is necrotic.  She was not consenting to BKA.  On 7/19, Dr. Carlis Abbott initially recommended right below-knee amputation given I did not think the limb would be salvageable.  She adamantly refused this and we attempted revascularization with SFA angioplasty and subsequent debridement of the heel and I told her still high risk for limb loss.  This is all necrotic again and I think she needs a BKA at this time.  She had a fever overnight to 101.2.  She is adamantly refusing amputation and states it is in God's hands.  I will broaden out her antibiotics to Vanc Zosyn after talking to pharmacy.  Consult palliative care because I think she is going to be at high risk for systemic sepsis and death without amputation.  On 28-Apr-2023, she continued to  refuse amputation.  Palliative care was consulted.   On 7/21, pt now willing to consent to [place] after discussion with palliative and her pastor.  abx were broadened with vanc/zosyn due to ongoing fevers.   On 7/21, she was taken to the operating room and underwent below knee amputation.    Findings: All of her vessels were heavily calcified. All tissue appeared viable and flaps were approximated  without undue tension  7/22, Dressing is dry this morning.  We will work on pain control.  Check CBC given blood loss in the OR per Dr. Donzetta Matters.  Anticipate discharge to SNF.  Dressing down tomorrow.  7/23, She has had no fevers since amputation.  We will continue her antibiotics for 24 more hours.  Hopefully have source control at this time.  Dressing removed today and BKA looks good.  Discussed leaving staples for 3 weeks.  PT recommends SNF and will get case management involved.  This was already discussed with the patient when we engaged palliative care and she is expecting to go to a facility.  Appreciate nephrology following for her ESRD.  7/24, No fevers in the past 24 hours and since her amputation now we have source control.  We will stop antibiotics today given she has received them 48 hours postop.  Plan is SNF and appreciate case management.  Right BKA looks good.  It was noted that pt was passing exam gloves in her stool.  Gloves were moved out of pt's reach.    7/25, Incision looks great.  Awaiting SNF placement.  No fevers and antibiotics were continued 48 hours postop.  7/26, Right BKA continues to look good.  Awaiting SNF placement.  Stable for discharge from my standpoint.  Remains afebrile.  7/27, continue to await placement.  PA Received telephone call from attending RN regarding patient's oral consumption of exam gloves.  She states another glove was found in the patient's stool today.  The patient has not had GI distress and is tolerating her diet.  I contacted behavioral health services and they recommend outpatient referral.  Continue efforts at removing gloves from the patient's room and keeping them out of her reach when off the floor for dialysis, etc.  7/28 right BKA stump continues to look good. still awaiting SNF bed at [facility]. The patient is not having any GI distress from glove consumption a couple days ago. No further consumption of gloves. She will have outpatient  behavioral health arranged as outpatient. Renal Navigator has arranged outpatient hemodialysis at [facility] T/T/S at 7:20 am. She will have inpatient HD today. Remains stable for discharge once bed arranged.   Social work investigated pt's pain management situation.  She called [facility]  # [phone]- per pharmacy, Dr. Shelba Flake in April prescribe pain meds/narcortic. In June , script was written by [facility].   CSW called Shelba Flake office , which was [employer] [phone] Naida Sleight reported, MD no longer with their agency. She informed CSW patient had her first appointment in April, no show for May, June  and July appointments and the patient has not reached out for another appointment. She is not considered an establish patient with their clinic.    CBC    Component Value Date/Time   WBC 9.6 04/15/2020 1225   RBC 2.71 (L) 04/15/2020 1225   HGB 7.6 (L) 04/15/2020 1225   HCT 25.8 (L) 04/15/2020 1225   PLT 196 04/15/2020 1225   MCV 95.2 04/15/2020 1225  MCH 28.0 04/15/2020 1225   MCHC 29.5 (L) 04/15/2020 1225   RDW 15.8 (H) 04/15/2020 1225   LYMPHSABS 1.5 04/02/2019 2243   MONOABS 0.6 04/02/2019 2243   EOSABS 0.2 04/02/2019 2243   BASOSABS 0.0 04/02/2019 2243    BMET    Component Value Date/Time   NA 136 04/15/2020 1225   K 3.9 04/15/2020 1225   CL 98 04/15/2020 1225   CO2 26 04/15/2020 1225   GLUCOSE 113 (H) 04/15/2020 1225   BUN 26 (H) 04/15/2020 1225   CREATININE 8.19 (H) 04/15/2020 1225   CALCIUM 9.9 04/15/2020 1225   GFRNONAA 5 (L) 04/15/2020 1225   GFRAA 6 (L) 04/15/2020 1225      Discharge Instructions    Call MD for:  redness, tenderness, or signs of infection (pain, swelling, bleeding, redness, odor or green/yellow discharge around incision site)   Complete by: As directed    Call MD for:  severe or increased pain, loss or decreased feeling  in affected limb(s)   Complete by: As directed    Call MD for:  temperature  >100.5   Complete by: As directed    Discharge wound care:   Complete by: As directed    Wash incision daily with soap and water.   Resume previous diet   Complete by: As directed       Discharge Diagnosis:  PAD (peripheral artery disease) ([place]) [I73.9]  Secondary Diagnosis: Patient Active Problem List   Diagnosis Date Noted  . Right leg pain   . Palliative care by specialist   . Nausea and vomiting 02/28/2020  . Abrasion 02/28/2020  . Avulsion fracture of ankle 02/28/2020  . Drug or medicinal substance causing adverse effect in therapeutic use 02/28/2020  . Elevated brain natriuretic peptide (BNP) level 02/28/2020  . HLD (hyperlipidemia) 02/28/2020  . Hypertension, poor control 02/28/2020  . Hypertensive emergency 02/28/2020  . Hypoxia 02/28/2020  . Mild dehydration 02/28/2020  . Sedimentation rate elevation 02/28/2020  . Strange and inexplicable behavior 42/70/6237  . COVID-19 01/19/2020  . Dysphasia status post cerebrovascular accident 11/06/2019  . Lumbar spondylosis 11/06/2019  . Anxiety and depression 06/04/2019  . Cerebral infarction due to thrombosis of unspecified precerebral artery ([place]) 06/04/2019  . Chronic pain syndrome 06/04/2019  . Neck pain on right side 06/04/2019  . Stroke due to embolism of right middle cerebral artery ([place]) 06/02/2019  . Pulmonary edema 02/20/2019  . Excoriation (skin-picking) disorder 11/22/2018  . Type 2 diabetes mellitus with hyperglycemia ([place]) 11/14/2018  . SBO (small bowel obstruction) ([place]) 07/08/2018  . Small bowel obstruction ([place]) 07/08/2018  . Hypercalcemia 02/17/2018  . Weakness generalized   . Acute on chronic systolic CHF (congestive heart failure) ([place])   . ESRD (end stage renal disease) on dialysis ([place])   . Homelessness   . Late effect of cerebrovascular accident (CVA)   . Dyspnea 01/27/2018  . Hypertension, uncontrolled 11/11/2017  . Fluid overload 11/11/2017  . Abdominal pain   . Sepsis due to methicillin  resistant Staphylococcus aureus ([place]) 11/01/2017  . Penicillin allergy 10/15/2017  . Thrombocytopenia ([place]) 10/15/2017  . Personal history of transient ischemic attack (TIA), and cerebral infarction without residual deficits 09/15/2017  . Dysarthria 09/14/2017  . Lacunar infarct, acute ([place]) 09/14/2017  . Obesity 09/09/2017  . Chest pain 09/07/2017  . Other staphylococcus as the cause of diseases classified elsewhere 08/23/2017  . Diarrhea, unspecified 12/29/2016  . Fluid overload, unspecified 10/03/2016  . Hyperkalemia 10/03/2016  . Hypertension with fluid overload 10/03/2016  .  Anemia in other chronic diseases classified elsewhere 09/08/2016  . High anion gap metabolic acidosis 29/79/8921  . Right lower lobe pneumonia 09/08/2016  . Iron deficiency anemia, unspecified 02/19/2016  . Complication of vascular dialysis catheter 02/18/2016  . Encounter for removal of sutures 02/11/2016  . Uncontrolled type 2 diabetes mellitus with chronic kidney disease on chronic dialysis, with long-term current use of insulin ([place]) 01/31/2016  . Drug noncompliance   . End-stage renal disease on hemodialysis ([place])   . Unspecified protein-calorie malnutrition ([place]) 01/24/2016  . Secondary hyperparathyroidism of renal origin ([place]) 01/22/2016  . Acquired absence of left leg below knee ([place]) 01/16/2016  . CHF (congestive heart failure) ([place]) 01/16/2016  . Coagulation defect, unspecified ([place]) 01/16/2016  . DNR (do not resuscitate) discussion 01/16/2016  . Family history of diabetes mellitus 01/16/2016  . Hypertensive chronic kidney disease with stage 1 through stage 4 chronic kidney disease, or unspecified chronic kidney disease 01/16/2016  . Nicotine dependence, other tobacco product, in remission 01/16/2016  . Pain, unspecified 01/16/2016  . Pruritus, unspecified 01/16/2016  . Red blood cell antibody positive 01/10/2016  . Hx of amputation below knee, right ([place]) 07/03/2013  . S/P BKA (below knee  amputation) unilateral ([place]) 06/30/2013  . Physical deconditioning 06/30/2013  . Cardiomyopathy, ischemic - EF 45-50% with inf WMA by 2D 02/05/13 02/06/2013  . Essential hypertension 02/05/2013  . PAD (peripheral artery disease) ([place]) 02/05/2013  . Diabetic neuropathy ([place])   . Osteomyelitis of left great toe - S/P amputation 02/06/13 04/21/2011   Past Medical History:  Diagnosis Date  . Anemia   . Anxiety   . Eczema   . ESRD (end stage renal disease) ([place])    [place] - TTHSAT- [place]  . Exertional shortness of breath   . GERD (gastroesophageal reflux disease)   . Headache   . History of blood transfusion   . History of kidney stones    passed  . Hypertension   . Ischemic cardiomyopathy    by echo 2014  . Neuropathy   . Pneumonia 09/2016  . Type II diabetes mellitus ([place]) 1995     Allergies as of 04/16/2020      Reactions   Baclofen Itching   Penicillins Anaphylaxis, Hives, Rash, Other (See Comments)   PATIENT HAD A PCN REACTION WITH IMMEDIATE RASH, FACIAL/TONGUE/THROAT SWELLING, SOB, OR LIGHTHEADEDNESS WITH HYPOTENSION:  #  #  #  YES  #  #  #   SEVERE RASH INVOLVING MUCUS MEMBRANES or SKIN NECROSIS: #  #  #  YES  #  #  # Has patient had a PCN reaction that required hospitalization No Has patient had a PCN reaction occurring within the last 10 years: No If all of the above answers are "NO", then may proceed with Cephalosporin use. 09/10/16- tolerated Cefepime   Morphine And Related Hives, Rash   Novolog [insulin Aspart] Other (See Comments)   Cramps/ Gi distress      Medication List    STOP taking these medications   ciprofloxacin 250 MG tablet Commonly known as: CIPRO   clindamycin 150 MG capsule Commonly known as: CLEOCIN   dexamethasone 6 MG tablet Commonly known as: DECADRON     TAKE these medications   amLODipine 10 MG tablet Commonly known as: NORVASC Take 10 mg by mouth daily.   aspirin EC 81 MG tablet Take 81 mg by mouth daily. Swallow whole. What  changed: Another medication with the same name was removed. Continue taking this medication, and follow  the directions you see here.   atorvastatin 40 MG tablet Commonly known as: LIPITOR Take 40 mg by mouth daily.   calcitRIOL 0.5 MCG capsule Commonly known as: ROCALTROL Take 3 capsules (1.5 mcg total) by mouth Every Tuesday,Thursday,and Saturday with dialysis. What changed: when to take this   calcium carbonate 1250 (500 Ca) MG chewable tablet Commonly known as: OS-CAL Chew 1 tablet by mouth 3 (three) times daily.   cinacalcet 30 MG tablet Commonly known as: SENSIPAR Take 1 tablet (30 mg total) by mouth Every Tuesday,Thursday,and Saturday with dialysis. What changed: when to take this   clopidogrel 75 MG tablet Commonly known as: PLAVIX Take 1 tablet (75 mg total) by mouth daily with breakfast.   Darbepoetin Alfa 200 MCG/0.4ML Sosy injection Commonly known as: ARANESP Inject 0.4 mLs (200 mcg total) into the vein every Thursday with hemodialysis.   ferric gluconate 62.5 mg in sodium chloride 0.9 % 100 mL Inject 62.5 mg into the vein every Thursday with hemodialysis.   ferrous sulfate 325 (65 FE) MG tablet Take 325 mg by mouth daily with breakfast.   gabapentin 100 MG capsule Commonly known as: NEURONTIN Take 100 mg by mouth every evening.   HumuLIN R 100 units/mL injection Generic drug: insulin regular Inject 0-15 Units into the skin See admin instructions. Sliding Scale   hydrALAZINE 25 MG tablet Commonly known as: APRESOLINE Take 25 mg by mouth 2 (two) times daily.   Lantus 100 UNIT/ML injection Generic drug: insulin glargine Inject 18 Units into the skin at bedtime.   lidocaine-prilocaine cream Commonly known as: EMLA Apply 1 application topically as directed.   methocarbamol 500 MG tablet Commonly known as: ROBAXIN Take 500 mg by mouth 2 (two) times daily.   metoprolol succinate 25 MG 24 hr tablet Commonly known as: TOPROL-XL Take 0.5 tablets (12.5 mg  total) by mouth daily.   MIRCERA IJ Mircera   multivitamin Tabs tablet Take 1 tablet by mouth at bedtime.   Narcan 4 MG/0.1ML Liqd nasal spray kit Generic drug: naloxone Place 0.4 mg into the nose once. Repeat after 3 minutes if no or minimal response   NovoFine 32G X 6 MM Misc Generic drug: Insulin Pen Needle   ondansetron 4 MG tablet Commonly known as: ZOFRAN Take 4 mg by mouth daily as needed for nausea or vomiting.   oxyCODONE 5 MG immediate release tablet Commonly known as: Oxy IR/ROXICODONE Take 5 mg by mouth every 4 (four) hours as needed for moderate pain.   pantoprazole 20 MG tablet Commonly known as: PROTONIX TAKE 1 TABLET(20 MG) BY MOUTH EVERY DAY What changed: See the new instructions.   polyethylene glycol 17 g packet Commonly known as: MIRALAX / GLYCOLAX Take 17 g by mouth daily.   sevelamer carbonate 800 MG tablet Commonly known as: RENVELA Take 800 mg by mouth 3 (three) times daily with meals.            Discharge Care Instructions  (From admission, onward)         Start     Ordered   04/16/20 0000  Discharge wound care:       Comments: Wash incision daily with soap and water.   04/16/20 0758          Prescriptions given: 1.  Roxicodone #30 No Refill (paper rx given) 2.  Plavix 57m daily x 30 days with no refill (paper rx given)  Instructions: 1.  Wash incision daily with soap and water. 2.  Please do not  give pt exam gloves due to pt consuming them.  Disposition: SNF  Patient's condition: is Limited  Follow up: 1. Dr. Carlis Abbott on 05/14/2020 at 3pm 2. Ambulatory referral to psychiatry   Leontine Locket, [place] [facility] [phone] 04/16/2020  3:20 PM

## 2020-04-16 NOTE — TOC Progression Note (Signed)
Transition of Care [facility]) - Progression Note    Patient Details  Name: Karen [name] MRN: [id number] Date of Birth: 1976-05-16  Transition of Care [facility]) CM/SW [place], [place] Phone Number: 04/16/2020, 8:43 AM  Clinical Narrative:     CSW  Informed Wandra Feinstein, per Renal Navigator, patient HD outpatient clinic is temporary set up with  [facility] HD T/Th/Sat 7am seat time 7:20am. CSW is waiting for [facility] to confirm they can transport patient at times indicated. Patient will need covid test and results  prior to discharging to SNF, once confirmed.   CSW will continue to follow and assist with discharge planning.   Thurmond Butts, MSW, LCSWA Clinical Social Worker   Expected Discharge Plan: Skilled Nursing Facility Barriers to Discharge: Continued Medical Work up, SNF Pending bed offer  Expected Discharge Plan and Services Expected Discharge Plan: [place]   Discharge Planning Services: CM Consult     Expected Discharge Date: 04/16/20                                     Social Determinants of Health ([place]) Interventions    Readmission Risk Interventions Readmission Risk Prevention Plan 04/04/2020  Social Work Consult for [place] Planning/Counseling Complete  Medication Review Press photographer) Complete  Some recent data might be hidden

## 2020-04-16 NOTE — Progress Notes (Signed)
Mobility Specialist: Progress Note    04/16/20 1554  Mobility  Activity Transferred to/from [facility]  Level of Assistance +2 (takes two people)  Assistive Device None  Mobility Response Tolerated poorly  Mobility performed by Mobility specialist  Bed Position Semi-fowlers  $Mobility charge 1 Mobility   Transferred pt to [facility] w/ assistance from NT.   [facility] [place] Mobility Specialist

## 2020-04-16 NOTE — Progress Notes (Signed)
Received telephone call from attending RN regarding patient's oral consumption of exam gloves.  She states another glove was found in the patient's stool today.  The patient has not had GI distress and is tolerating her diet.  I contacted behavioral health services and they recommend outpatient referral.  Continue efforts at removing gloves from the patient's room and keeping them out of her reach when off the floor for dialysis, etc.

## 2020-04-16 NOTE — Progress Notes (Signed)
[facility] [place]  Subjective  - pain well controlled.   Objective 126/71 88 98.1 F (36.7 C) (Oral) 23 100%  Intake/Output Summary (Last 24 hours) at 04/16/2020 0759 Last data filed at 04/15/2020 1110 Gross per 24 hour  Intake 120 ml  Output --  Net 120 ml    Right BKA c/d/i with staples  Laboratory Lab Results: Recent Labs    04/15/20 1041 04/15/20 1225  WBC 9.1 9.6  HGB 8.2* 7.6*  HCT 27.4* 25.8*  PLT 206 196   BMET Recent Labs    04/15/20 1041 04/15/20 1225  NA 135 136  K 3.7 3.9  CL 97* 98  CO2 24 26  GLUCOSE 96 113*  BUN 26* 26*  CREATININE 8.04* 8.19*  CALCIUM 9.9 9.9    COAG Lab Results  Component Value Date   INR 1.15 02/08/2013   No results found for: PTT  Assessment/Planning:  44 year old female status post right BKA for nonsalvageable limb.  BKA continues to look good.  Discussed leaving staples for 3 weeks and will arrange follow-up in clinic.  Clinically stable for discharge to SNF when she has a bed available.  Appreciate nephrology following for ESRD.  Marty Heck 04/16/2020 7:59 AM --

## 2020-04-16 NOTE — TOC Progression Note (Signed)
Transition of Care [facility]) - Progression Note    Patient Details  Name: Karen [name] MRN: [id number] Date of Birth: [birth year]  Transition of Care [facility]) CM/SW [place], [place] Phone Number: 04/16/2020, 2:19 PM  Clinical Narrative:     CSW met with patient at bedside. CSW introduced self and explained role. Patient remains agreeable to short term rehab at [facility]. Patient states her 44 year son lives with her in the home (he is currently staying with her sister). She reports [facility] assist her in the home M/W/F 12:30pm-8:30pm, no weekends but states she will be requesting more days and time.   Patient states she last went to pain clinic  In April 2021. She reports she does not want to return there and has requested her doctor find her another clinic. Patient states she does not like the clinic "people there lie".   Patient states she uses public transportation to pain clinic and out patient dialysis center.  CSW discussed concerns about consuming gloves. Patient states she was depressed and immediately states "I am not going to do that anymore". CSW inquired if she was trying to harm herself when she consumed the glove, patient states" no". She does it out of habit.  CSW requested patient to share how she is feeling now, patient states  " I prayed about it and I am alright". "I got God".Patient denied thoughts to harm self or others. CSW discussed with patient  healthier alternatives to cope with depression. CSW encouraged the patient to seek counsel/help and request to talk with someone when feeling depressed and  before consuming non-edible items. Patient states understanding. CSW provided patient with mental health resources.     Expected Discharge Plan: Skilled Nursing Facility Barriers to Discharge: Continued Medical Work up, SNF Pending bed offer  Expected Discharge Plan and Services Expected Discharge Plan: [place]   Discharge Planning  Services: CM Consult     Expected Discharge Date: 04/16/20                                     Social Determinants of Health ([place]) Interventions    Readmission Risk Interventions Readmission Risk Prevention Plan 04/04/2020  Social Work Consult for [place] Planning/Counseling Complete  Medication Review Press photographer) Complete  Some recent data might be hidden

## 2020-04-17 DIAGNOSIS — M6281 Muscle weakness (generalized): Secondary | ICD-10-CM | POA: Diagnosis present

## 2020-04-17 DIAGNOSIS — R279 Unspecified lack of coordination: Secondary | ICD-10-CM | POA: Diagnosis not present

## 2020-04-17 DIAGNOSIS — E1165 Type 2 diabetes mellitus with hyperglycemia: Secondary | ICD-10-CM | POA: Diagnosis present

## 2020-04-17 DIAGNOSIS — R030 Elevated blood-pressure reading, without diagnosis of hypertension: Secondary | ICD-10-CM | POA: Diagnosis present

## 2020-04-17 DIAGNOSIS — R2681 Unsteadiness on feet: Secondary | ICD-10-CM | POA: Diagnosis present

## 2020-04-17 DIAGNOSIS — D509 Iron deficiency anemia, unspecified: Secondary | ICD-10-CM | POA: Diagnosis not present

## 2020-04-17 DIAGNOSIS — L97413 Non-pressure chronic ulcer of right heel and midfoot with necrosis of muscle: Secondary | ICD-10-CM | POA: Diagnosis not present

## 2020-04-17 DIAGNOSIS — N250 Renal osteodystrophy: Secondary | ICD-10-CM | POA: Diagnosis not present

## 2020-04-17 DIAGNOSIS — Z743 Need for continuous supervision: Secondary | ICD-10-CM | POA: Diagnosis not present

## 2020-04-17 DIAGNOSIS — R410 Disorientation, unspecified: Secondary | ICD-10-CM | POA: Diagnosis not present

## 2020-04-17 DIAGNOSIS — I120 Hypertensive chronic kidney disease with stage 5 chronic kidney disease or end stage renal disease: Secondary | ICD-10-CM | POA: Diagnosis not present

## 2020-04-17 DIAGNOSIS — E1122 Type 2 diabetes mellitus with diabetic chronic kidney disease: Secondary | ICD-10-CM | POA: Diagnosis not present

## 2020-04-17 DIAGNOSIS — K5900 Constipation, unspecified: Secondary | ICD-10-CM | POA: Diagnosis not present

## 2020-04-17 DIAGNOSIS — Z992 Dependence on renal dialysis: Secondary | ICD-10-CM | POA: Diagnosis not present

## 2020-04-17 DIAGNOSIS — E11621 Type 2 diabetes mellitus with foot ulcer: Secondary | ICD-10-CM | POA: Diagnosis not present

## 2020-04-17 DIAGNOSIS — R5381 Other malaise: Secondary | ICD-10-CM | POA: Diagnosis not present

## 2020-04-17 DIAGNOSIS — I5020 Unspecified systolic (congestive) heart failure: Secondary | ICD-10-CM | POA: Diagnosis not present

## 2020-04-17 DIAGNOSIS — R296 Repeated falls: Secondary | ICD-10-CM | POA: Diagnosis not present

## 2020-04-17 DIAGNOSIS — N186 End stage renal disease: Secondary | ICD-10-CM | POA: Diagnosis present

## 2020-04-17 DIAGNOSIS — Z9981 Dependence on supplemental oxygen: Secondary | ICD-10-CM | POA: Diagnosis not present

## 2020-04-17 DIAGNOSIS — Z4789 Encounter for other orthopedic aftercare: Secondary | ICD-10-CM | POA: Diagnosis not present

## 2020-04-17 DIAGNOSIS — R2689 Other abnormalities of gait and mobility: Secondary | ICD-10-CM | POA: Diagnosis present

## 2020-04-17 DIAGNOSIS — N2581 Secondary hyperparathyroidism of renal origin: Secondary | ICD-10-CM | POA: Diagnosis not present

## 2020-04-17 DIAGNOSIS — D638 Anemia in other chronic diseases classified elsewhere: Secondary | ICD-10-CM | POA: Diagnosis not present

## 2020-04-17 DIAGNOSIS — I739 Peripheral vascular disease, unspecified: Secondary | ICD-10-CM | POA: Diagnosis present

## 2020-04-17 DIAGNOSIS — R4182 Altered mental status, unspecified: Secondary | ICD-10-CM | POA: Diagnosis not present

## 2020-04-17 DIAGNOSIS — R471 Dysarthria and anarthria: Secondary | ICD-10-CM | POA: Diagnosis not present

## 2020-04-17 DIAGNOSIS — D631 Anemia in chronic kidney disease: Secondary | ICD-10-CM | POA: Diagnosis not present

## 2020-04-17 DIAGNOSIS — I10 Essential (primary) hypertension: Secondary | ICD-10-CM | POA: Diagnosis not present

## 2020-04-17 DIAGNOSIS — G894 Chronic pain syndrome: Secondary | ICD-10-CM | POA: Diagnosis not present

## 2020-04-17 DIAGNOSIS — G3184 Mild cognitive impairment, so stated: Secondary | ICD-10-CM | POA: Diagnosis not present

## 2020-04-17 DIAGNOSIS — R1312 Dysphagia, oropharyngeal phase: Secondary | ICD-10-CM | POA: Diagnosis present

## 2020-04-17 DIAGNOSIS — E119 Type 2 diabetes mellitus without complications: Secondary | ICD-10-CM | POA: Diagnosis not present

## 2020-04-17 DIAGNOSIS — E785 Hyperlipidemia, unspecified: Secondary | ICD-10-CM | POA: Diagnosis not present

## 2020-04-17 DIAGNOSIS — E1129 Type 2 diabetes mellitus with other diabetic kidney complication: Secondary | ICD-10-CM | POA: Diagnosis not present

## 2020-04-17 LAB — HEPATITIS B SURFACE ANTIGEN: Hepatitis B Surface Ag: NONREACTIVE

## 2020-04-17 LAB — GLUCOSE, CAPILLARY
Glucose-Capillary: 102 mg/dL — ABNORMAL HIGH (ref 70–99)
Glucose-Capillary: 159 mg/dL — ABNORMAL HIGH (ref 70–99)

## 2020-04-17 MED ORDER — OXYCODONE HCL 5 MG PO TABS: 5.0000 mg | ORAL_TABLET | ORAL | 0 refills | Status: DC | PRN

## 2020-04-17 MED ORDER — CLOPIDOGREL BISULFATE 75 MG PO TABS: 75.0000 mg | ORAL_TABLET | Freq: Every day | ORAL | 0 refills | Status: DC

## 2020-04-17 NOTE — Progress Notes (Signed)
  Speech Language Pathology Treatment: Cognitive-Linquistic (Dysarthria)  Patient Details Name: Karen [name] MRN: [id number] DOB: 1976/07/30 Today's Date: 04/17/2020 Time: 1039-1100 SLP Time Calculation (min) (ACUTE ONLY): 21 min  Assessment / Plan / Recommendation Clinical Impression  Pt was seen for dysarthria treatment and was cooperative during the session. She was alert and her speech intelligibility was significantly improved compared to the last session. She indicated that she recalled working with this SLP prior but was very tired and did not recall the content of the session. She stated that individuals have been understanding her without significant difficulty. She was educated regarding the nature of dysarthria and compensatory strategies to improve speech intelligibility. She used compensatory strategies at the sentence level with 75% accuracy increasing to 100% accuracy with min. cues for overarticulation. However, she was noted to independently use some of the strategies when clarification was requested from staff. Pt's speech is currently at baseline and she has received SLP services in the past. Pt was educated regarding the need for continued use of compensatory strategies and she verbalized agreement. Acute SLP services will be discontinued at this time.    HPI HPI: Pt is a 44 y.o. female, with history of end-stage renal disease on hemodialysis Monday Wednesday Friday, HTN, L BKA, PAD, ischemic cardiomyopathy, diabetes, previous stroke with some ongoing dysphagia who presented for evaluation of right heel wound.    MBS 01/28/18: mild dysphagia (as well as moderate dysarthria, likely attributable to the chronic ischemic demyelination of pontine fibers noted on 09/11/17 MRI) marked by mild delays in oral preparation, impaired laryngeal-vestibular closure, leading to entry of thin liquids into the larynx prior to the swallow.  Thin liquids penetrated to the level of the vocal folds, which  elicited a cough response, but they were not aspirated.  Postural adjustments (head rotations, chin tuck) did not prevent penetration.        SLP Plan  All goals met;Discharge SLP treatment due to (comment)       Recommendations                   Follow up Recommendations: Skilled Nursing facility SLP Visit Diagnosis: Dysarthria and anarthria (R47.1) Plan: All goals met;Discharge SLP treatment due to (comment)       Jeily Guthridge I. Hardin Negus, [place], [place] Office number [phone] Pager [place] 04/17/2020, 1:28 PM

## 2020-04-17 NOTE — Care Management Important Message (Signed)
Important Message  Patient Details  Name: Karen [name] MRN: [id number] Date of Birth: 07/15/1976   Medicare Important Message Given:  Yes     Shelda Altes 04/17/2020, 8:28 AM

## 2020-04-17 NOTE — Progress Notes (Signed)
Attempted report to [place] x2.  

## 2020-04-17 NOTE — TOC Transition Note (Signed)
Transition of Care [facility]) - CM/SW Discharge Note   Patient Details  Name: Karen [name] MRN: [id number] Date of Birth: [birth year]  Transition of Care [facility] CM/SW Contact:  Vinie Sill, [place] Phone Number: 04/17/2020, 2:19 PM   Clinical Narrative:     Patient will DC to: [place]  DC Date: 04/17/2020 Family Notified: Tristan,friend, left voice message  Transport By: Corey Harold   Per MD patient is ready for discharge. RN, patient, and facility notified of DC. Discharge Summary sent to facility. RN given number for [phone]. Ambulance transport requested for patient.   Clinical Social Worker signing off.  Thurmond Butts, MSW, [place] Clinical Social Worker    Final next level of care: Skilled Nursing Facility Barriers to Discharge: Barriers Resolved   Patient Goals and CMS Choice        Discharge Placement              Patient chooses bed at:  [facility]) Patient to be transferred to facility by: [place] Name of family member notified: Tristan,friend Patient and family notified of of transfer: 04/17/20  Discharge Plan and Services   Discharge Planning Services: CM Consult                                 Social Determinants of Health ([place]) Interventions     Readmission Risk Interventions Readmission Risk Prevention Plan 04/04/2020  Social Work Consult for [place] Planning/Counseling Complete  Medication Review Press photographer) Complete  Some recent data might be hidden

## 2020-04-17 NOTE — Progress Notes (Signed)
Mobility Specialist: Progress Note   04/17/20 1509  Mobility  Activity Dangled on edge of bed  Level of Assistance Maximum assist, patient does 25-49%  Assistive Device None  Mobility Response Tolerated fair  Mobility performed by Mobility specialist  Bed Position High-fowlers  $Mobility charge 1 Mobility   Pt dangled on EOB for 3 minutes. Pt said she felt good today.   [facility] Sydnee Lamour Mobility Specialist

## 2020-04-17 NOTE — Progress Notes (Addendum)
  Progress Note    04/17/2020 7:36 AM 7 Days Post-Op  Subjective:  No complaints this morning. States pain is a 5/10   Vitals:   04/17/20 0007 04/17/20 0255  BP: (!) 133/75 (!) 147/73  Pulse: 91 78  Resp: 17 14  Temp: 98.3 F (36.8 C) 98.5 F (36.9 C)  SpO2: 92% 94%   Physical Exam: Cardiac:  regular Lungs: non labored Incisions:  Right BKA site clean, dry and intact. No fluid collections Extremities: Left BKA well healed. Lower extremities well perfused Abdomen:  Obese, soft, non tender Neurologic: alert and oriented  CBC    Component Value Date/Time   WBC 9.6 04/15/2020 1225   RBC 2.71 (L) 04/15/2020 1225   HGB 7.6 (L) 04/15/2020 1225   HCT 25.8 (L) 04/15/2020 1225   PLT 196 04/15/2020 1225   MCV 95.2 04/15/2020 1225   MCH 28.0 04/15/2020 1225   MCHC 29.5 (L) 04/15/2020 1225   RDW 15.8 (H) 04/15/2020 1225   LYMPHSABS 1.5 04/02/2019 2243   MONOABS 0.6 04/02/2019 2243   EOSABS 0.2 04/02/2019 2243   BASOSABS 0.0 04/02/2019 2243    BMET    Component Value Date/Time   NA 136 04/15/2020 1225   K 3.9 04/15/2020 1225   CL 98 04/15/2020 1225   CO2 26 04/15/2020 1225   GLUCOSE 113 (H) 04/15/2020 1225   BUN 26 (H) 04/15/2020 1225   CREATININE 8.19 (H) 04/15/2020 1225   CALCIUM 9.9 04/15/2020 1225   GFRNONAA 5 (L) 04/15/2020 1225   GFRAA 6 (L) 04/15/2020 1225    INR    Component Value Date/Time   INR 1.15 02/08/2013 0500    No intake or output data in the 24 hours ending 04/17/20 0736   Assessment/Plan:  44 y.o. female is s/p right below knee amputation 7 Days Post-Op. Post op pain well controlled. inpatient HD today. Out pt HD seat arranged. Negative Covid. Pending bed at [facility]. She is stable for discharge when bed available. She does not have outpatient pain management established any more so will provider her with pain prescription on discharge. Behavioral health outpatient referral will be arranged. She will follow up in our clinic in 3  weeks for staple removal  DVT prophylaxis:  Sq. Heparin   Karoline Caldwell, PA-C [facility] [phone] 04/17/2020 7:36 AM   I have seen and evaluated the patient. I agree with the PA note as documented above.  Okay for discharge to SNF from vascular standpoint.  Right BKA looks good.  Follow-up arranged in 3 weeks to have the staples removed.  Marty Heck, MD [facility] [place] Office: [phone]

## 2020-04-17 NOTE — Progress Notes (Addendum)
IV out. Monitor off CCMD notified. Leaving via [place]. Report given to Publishing rights manager at [facility], RN

## 2020-04-17 NOTE — Progress Notes (Signed)
Patient ID: Karen [name], female   DOB: August 18, 1976, 44 y.o.   MRN: [id number] [name] Progress Note   Assessment/ Plan:   1. Critical limb ischemia with necrotic right heel ulcer status post right below-knee amputation on 04/10/2020: Failed conservative management. Now with satisfactory wound healing and postoperative pain control plan as outlined by Dr. Carlis Abbott.  Awaiting SNF admission likely here in [place] (will require transient dialysis unit spot here) for continued PT/OT.  2. ESRD -continue hemodialysis on a Monday/Wednesday/Friday schedule with next dialysis ordered for today. Last hemodialysis treatment demarcated by breakthrough pain. 3. Hypertension/volume -she appears to be close to euvolemic on physical exam and has been normotensive.  Continue to monitor with hemodialysis/ultrafiltration. 4. Anemia-with low hemoglobin and hematocrit postoperatively, continue ESA with dialysis. 5. Metabolic bone disease -calcium and phosphorus levels both at goal, continue sevelamer.  Hectorol discontinued because of hypercalcemia. 6. Nutrition -continue renal diet with fluid restriction and ongoing ONS.   Subjective:   Reports that she had a comfortable night and continues to have surgical site pain on and off.   Objective:   BP (!) 134/66 (BP Location: Right Arm)   Pulse 74   Temp 97.9 F (36.6 C) (Oral)   Resp 16   Ht 5' 5.98" (1.676 m)   Wt (!) 98.3 kg   SpO2 98%   BMI 35.00 kg/m  No intake or output data in the 24 hours ending 04/17/20 0856 Weight change:   Physical Exam: Gen: Comfortably resting in bed, awakens easily to conversation. CVS: Pulse regular rhythm, normal rate, S1 and S2 normal Resp: Clear to auscultation, no rales/rhonchi Abd: Soft, flat, nontender Ext: Right BKA stump intact staples with trace edema.  Left BKA stump intact.  Left upper arm AV graft with bruit.  Imaging: No results found.  Labs: BMET Recent Labs  Lab 04/11/20 0337  04/12/20 0329 04/13/20 0331 04/15/20 1041 04/15/20 1225  NA 137 137 140 135 136  K 3.9 4.6 3.6 3.7 3.9  CL 98 99 100 97* 98  CO2 26 23 28 24 26   GLUCOSE 95 99 107* 96 113*  BUN 16 24* 10 26* 26*  CREATININE 4.84* 6.41* 3.90* 8.04* 8.19*  CALCIUM 8.9 9.4 9.4 9.9 9.9  PHOS 5.0* 6.0* 3.3 4.9* 5.1*   CBC Recent Labs  Lab 04/12/20 0329 04/14/20 0401 04/15/20 1041 04/15/20 1225  WBC 10.4 9.7 9.1 9.6  HGB 7.3* 7.4* 8.2* 7.6*  HCT 24.0* 25.1* 27.4* 25.8*  MCV 93.4 94.7 95.1 95.2  PLT 167 193 206 196   Medications:    . acetaminophen  650 mg Oral Q8H  . amLODipine  10 mg Oral Daily  . aspirin EC  81 mg Oral Daily  . atorvastatin  40 mg Oral Daily  . Chlorhexidine Gluconate Cloth  6 each Topical Q0600  . clopidogrel  300 mg Oral Once   Followed by  . clopidogrel  75 mg Oral Q breakfast  . darbepoetin (ARANESP) injection - DIALYSIS  150 mcg Intravenous Q Wed-HD  . docusate sodium  100 mg Oral Daily  . feeding supplement (NEPRO CARB STEADY)  237 mL Oral BID BM  . gabapentin  100 mg Oral QHS  . heparin injection (subcutaneous)  5,000 Units Subcutaneous Q8H  . insulin aspart  0-15 Units Subcutaneous TID WC  . insulin aspart  0-5 Units Subcutaneous QHS  . insulin glargine  15 Units Subcutaneous QHS  . ketorolac  30 mg Intravenous Once  . methocarbamol  500  mg Oral BID  . metoprolol succinate  12.5 mg Oral Daily  . multivitamin  1 tablet Oral QHS  . pantoprazole  40 mg Oral Daily  . polyethylene glycol  17 g Oral Daily  . sevelamer carbonate  2,400 mg Oral TID WC  . sodium chloride flush  3 mL Intravenous Q12H   Elmarie Shiley, MD 04/17/2020, 8:56 AM

## 2020-04-18 DIAGNOSIS — N2581 Secondary hyperparathyroidism of renal origin: Secondary | ICD-10-CM | POA: Diagnosis not present

## 2020-04-18 DIAGNOSIS — E1129 Type 2 diabetes mellitus with other diabetic kidney complication: Secondary | ICD-10-CM | POA: Diagnosis not present

## 2020-04-18 DIAGNOSIS — E119 Type 2 diabetes mellitus without complications: Secondary | ICD-10-CM | POA: Diagnosis not present

## 2020-04-18 DIAGNOSIS — D638 Anemia in other chronic diseases classified elsewhere: Secondary | ICD-10-CM | POA: Diagnosis not present

## 2020-04-18 DIAGNOSIS — Z992 Dependence on renal dialysis: Secondary | ICD-10-CM | POA: Diagnosis not present

## 2020-04-18 DIAGNOSIS — I739 Peripheral vascular disease, unspecified: Secondary | ICD-10-CM | POA: Diagnosis not present

## 2020-04-18 DIAGNOSIS — E785 Hyperlipidemia, unspecified: Secondary | ICD-10-CM | POA: Diagnosis not present

## 2020-04-18 DIAGNOSIS — N186 End stage renal disease: Secondary | ICD-10-CM | POA: Diagnosis not present

## 2020-04-18 DIAGNOSIS — D631 Anemia in chronic kidney disease: Secondary | ICD-10-CM | POA: Diagnosis not present

## 2020-04-19 DIAGNOSIS — N186 End stage renal disease: Secondary | ICD-10-CM | POA: Diagnosis not present

## 2020-04-19 DIAGNOSIS — D638 Anemia in other chronic diseases classified elsewhere: Secondary | ICD-10-CM | POA: Diagnosis not present

## 2020-04-19 DIAGNOSIS — E119 Type 2 diabetes mellitus without complications: Secondary | ICD-10-CM | POA: Diagnosis not present

## 2020-04-19 DIAGNOSIS — R471 Dysarthria and anarthria: Secondary | ICD-10-CM | POA: Diagnosis not present

## 2020-04-19 DIAGNOSIS — I10 Essential (primary) hypertension: Secondary | ICD-10-CM | POA: Diagnosis not present

## 2020-04-21 DIAGNOSIS — E1122 Type 2 diabetes mellitus with diabetic chronic kidney disease: Secondary | ICD-10-CM | POA: Diagnosis not present

## 2020-04-21 DIAGNOSIS — N186 End stage renal disease: Secondary | ICD-10-CM | POA: Diagnosis not present

## 2020-04-21 DIAGNOSIS — Z992 Dependence on renal dialysis: Secondary | ICD-10-CM | POA: Diagnosis not present

## 2020-04-23 DIAGNOSIS — D631 Anemia in chronic kidney disease: Secondary | ICD-10-CM | POA: Diagnosis not present

## 2020-04-23 DIAGNOSIS — D509 Iron deficiency anemia, unspecified: Secondary | ICD-10-CM | POA: Diagnosis not present

## 2020-04-23 DIAGNOSIS — Z992 Dependence on renal dialysis: Secondary | ICD-10-CM | POA: Diagnosis not present

## 2020-04-23 DIAGNOSIS — E1129 Type 2 diabetes mellitus with other diabetic kidney complication: Secondary | ICD-10-CM | POA: Diagnosis not present

## 2020-04-23 DIAGNOSIS — N2581 Secondary hyperparathyroidism of renal origin: Secondary | ICD-10-CM | POA: Diagnosis not present

## 2020-04-23 DIAGNOSIS — N186 End stage renal disease: Secondary | ICD-10-CM | POA: Diagnosis not present

## 2020-04-25 DIAGNOSIS — Z992 Dependence on renal dialysis: Secondary | ICD-10-CM | POA: Diagnosis not present

## 2020-04-25 DIAGNOSIS — N186 End stage renal disease: Secondary | ICD-10-CM | POA: Diagnosis not present

## 2020-04-25 DIAGNOSIS — D509 Iron deficiency anemia, unspecified: Secondary | ICD-10-CM | POA: Diagnosis not present

## 2020-04-25 DIAGNOSIS — N2581 Secondary hyperparathyroidism of renal origin: Secondary | ICD-10-CM | POA: Diagnosis not present

## 2020-04-25 DIAGNOSIS — D631 Anemia in chronic kidney disease: Secondary | ICD-10-CM | POA: Diagnosis not present

## 2020-04-25 DIAGNOSIS — E1129 Type 2 diabetes mellitus with other diabetic kidney complication: Secondary | ICD-10-CM | POA: Diagnosis not present

## 2020-04-26 DIAGNOSIS — E119 Type 2 diabetes mellitus without complications: Secondary | ICD-10-CM | POA: Diagnosis not present

## 2020-04-26 DIAGNOSIS — N186 End stage renal disease: Secondary | ICD-10-CM | POA: Diagnosis not present

## 2020-04-26 DIAGNOSIS — R471 Dysarthria and anarthria: Secondary | ICD-10-CM | POA: Diagnosis not present

## 2020-04-26 DIAGNOSIS — G3184 Mild cognitive impairment, so stated: Secondary | ICD-10-CM | POA: Diagnosis not present

## 2020-04-26 DIAGNOSIS — I10 Essential (primary) hypertension: Secondary | ICD-10-CM | POA: Diagnosis not present

## 2020-04-27 DIAGNOSIS — N2581 Secondary hyperparathyroidism of renal origin: Secondary | ICD-10-CM | POA: Diagnosis not present

## 2020-04-27 DIAGNOSIS — N186 End stage renal disease: Secondary | ICD-10-CM | POA: Diagnosis not present

## 2020-04-27 DIAGNOSIS — E1129 Type 2 diabetes mellitus with other diabetic kidney complication: Secondary | ICD-10-CM | POA: Diagnosis not present

## 2020-04-27 DIAGNOSIS — D509 Iron deficiency anemia, unspecified: Secondary | ICD-10-CM | POA: Diagnosis not present

## 2020-04-27 DIAGNOSIS — D631 Anemia in chronic kidney disease: Secondary | ICD-10-CM | POA: Diagnosis not present

## 2020-04-27 DIAGNOSIS — Z992 Dependence on renal dialysis: Secondary | ICD-10-CM | POA: Diagnosis not present

## 2020-04-30 DIAGNOSIS — N2581 Secondary hyperparathyroidism of renal origin: Secondary | ICD-10-CM | POA: Diagnosis not present

## 2020-04-30 DIAGNOSIS — D509 Iron deficiency anemia, unspecified: Secondary | ICD-10-CM | POA: Diagnosis not present

## 2020-04-30 DIAGNOSIS — D631 Anemia in chronic kidney disease: Secondary | ICD-10-CM | POA: Diagnosis not present

## 2020-04-30 DIAGNOSIS — N186 End stage renal disease: Secondary | ICD-10-CM | POA: Diagnosis not present

## 2020-04-30 DIAGNOSIS — Z992 Dependence on renal dialysis: Secondary | ICD-10-CM | POA: Diagnosis not present

## 2020-04-30 DIAGNOSIS — E1129 Type 2 diabetes mellitus with other diabetic kidney complication: Secondary | ICD-10-CM | POA: Diagnosis not present

## 2020-05-02 DIAGNOSIS — E1129 Type 2 diabetes mellitus with other diabetic kidney complication: Secondary | ICD-10-CM | POA: Diagnosis not present

## 2020-05-02 DIAGNOSIS — N186 End stage renal disease: Secondary | ICD-10-CM | POA: Diagnosis not present

## 2020-05-02 DIAGNOSIS — Z992 Dependence on renal dialysis: Secondary | ICD-10-CM | POA: Diagnosis not present

## 2020-05-02 DIAGNOSIS — D509 Iron deficiency anemia, unspecified: Secondary | ICD-10-CM | POA: Diagnosis not present

## 2020-05-02 DIAGNOSIS — N2581 Secondary hyperparathyroidism of renal origin: Secondary | ICD-10-CM | POA: Diagnosis not present

## 2020-05-02 DIAGNOSIS — D631 Anemia in chronic kidney disease: Secondary | ICD-10-CM | POA: Diagnosis not present

## 2020-05-03 DIAGNOSIS — I10 Essential (primary) hypertension: Secondary | ICD-10-CM | POA: Diagnosis not present

## 2020-05-03 DIAGNOSIS — G3184 Mild cognitive impairment, so stated: Secondary | ICD-10-CM | POA: Diagnosis not present

## 2020-05-03 DIAGNOSIS — R471 Dysarthria and anarthria: Secondary | ICD-10-CM | POA: Diagnosis not present

## 2020-05-03 DIAGNOSIS — N186 End stage renal disease: Secondary | ICD-10-CM | POA: Diagnosis not present

## 2020-05-03 DIAGNOSIS — E119 Type 2 diabetes mellitus without complications: Secondary | ICD-10-CM | POA: Diagnosis not present

## 2020-05-07 DIAGNOSIS — D631 Anemia in chronic kidney disease: Secondary | ICD-10-CM | POA: Diagnosis not present

## 2020-05-07 DIAGNOSIS — E1129 Type 2 diabetes mellitus with other diabetic kidney complication: Secondary | ICD-10-CM | POA: Diagnosis not present

## 2020-05-07 DIAGNOSIS — Z992 Dependence on renal dialysis: Secondary | ICD-10-CM | POA: Diagnosis not present

## 2020-05-07 DIAGNOSIS — D509 Iron deficiency anemia, unspecified: Secondary | ICD-10-CM | POA: Diagnosis not present

## 2020-05-07 DIAGNOSIS — N186 End stage renal disease: Secondary | ICD-10-CM | POA: Diagnosis not present

## 2020-05-07 DIAGNOSIS — N2581 Secondary hyperparathyroidism of renal origin: Secondary | ICD-10-CM | POA: Diagnosis not present

## 2020-05-09 DIAGNOSIS — E1129 Type 2 diabetes mellitus with other diabetic kidney complication: Secondary | ICD-10-CM | POA: Diagnosis not present

## 2020-05-09 DIAGNOSIS — N186 End stage renal disease: Secondary | ICD-10-CM | POA: Diagnosis not present

## 2020-05-09 DIAGNOSIS — D509 Iron deficiency anemia, unspecified: Secondary | ICD-10-CM | POA: Diagnosis not present

## 2020-05-09 DIAGNOSIS — Z992 Dependence on renal dialysis: Secondary | ICD-10-CM | POA: Diagnosis not present

## 2020-05-09 DIAGNOSIS — D631 Anemia in chronic kidney disease: Secondary | ICD-10-CM | POA: Diagnosis not present

## 2020-05-09 DIAGNOSIS — N2581 Secondary hyperparathyroidism of renal origin: Secondary | ICD-10-CM | POA: Diagnosis not present

## 2020-05-13 DIAGNOSIS — I10 Essential (primary) hypertension: Secondary | ICD-10-CM | POA: Diagnosis not present

## 2020-05-13 DIAGNOSIS — N186 End stage renal disease: Secondary | ICD-10-CM | POA: Diagnosis not present

## 2020-05-13 DIAGNOSIS — I5020 Unspecified systolic (congestive) heart failure: Secondary | ICD-10-CM | POA: Diagnosis not present

## 2020-05-13 DIAGNOSIS — E119 Type 2 diabetes mellitus without complications: Secondary | ICD-10-CM | POA: Diagnosis not present

## 2020-05-14 ENCOUNTER — Ambulatory Visit (INDEPENDENT_AMBULATORY_CARE_PROVIDER_SITE_OTHER): Payer: Medicare Other | Admitting: Vascular Surgery

## 2020-05-14 ENCOUNTER — Encounter: Payer: Self-pay | Admitting: Vascular Surgery

## 2020-05-14 ENCOUNTER — Other Ambulatory Visit: Payer: Self-pay

## 2020-05-14 VITALS — BP 183/92 | HR 95 | Temp 98.4°F | Resp 14

## 2020-05-14 DIAGNOSIS — N186 End stage renal disease: Secondary | ICD-10-CM | POA: Diagnosis not present

## 2020-05-14 DIAGNOSIS — D509 Iron deficiency anemia, unspecified: Secondary | ICD-10-CM | POA: Diagnosis not present

## 2020-05-14 DIAGNOSIS — E1129 Type 2 diabetes mellitus with other diabetic kidney complication: Secondary | ICD-10-CM | POA: Diagnosis not present

## 2020-05-14 DIAGNOSIS — I739 Peripheral vascular disease, unspecified: Secondary | ICD-10-CM

## 2020-05-14 DIAGNOSIS — Z992 Dependence on renal dialysis: Secondary | ICD-10-CM | POA: Diagnosis not present

## 2020-05-14 DIAGNOSIS — D631 Anemia in chronic kidney disease: Secondary | ICD-10-CM | POA: Diagnosis not present

## 2020-05-14 DIAGNOSIS — N2581 Secondary hyperparathyroidism of renal origin: Secondary | ICD-10-CM | POA: Diagnosis not present

## 2020-05-14 NOTE — Progress Notes (Signed)
 Patient name: Karen [name] MRN: [id number] DOB: 07/12/1976 Sex: female  REASON FOR VISIT: Postop check after right BKA  HPI: Karen [name] is a 44 y.o. female with multiple medical problems including end-stage renal disease, diabetes, PAD s/p previous left BKA, and critical limb ischemia of the right leg that presents for postop check after right BKA.  She initially presented with a large necrotic heel wound on the right foot.  Ultimately attempted right SFA angioplasty on 04/03/2020 and saw no significant improvement.  I had previously discussed with her prior to intervention being high risk for limb loss but refused initial amputation.  She had a right BKA on 04/10/2020.  Overall this is healing.  Her only concerns today has been constipation in her nursing facility that she states is being treated with miralax, enema, and disimpaction.    Past Medical History:  Diagnosis Date  . Anemia   . Anxiety   . Eczema   . ESRD (end stage renal disease) (HCC)    Hemo - [facility]- [place]  . Exertional shortness of breath   . GERD (gastroesophageal reflux disease)   . Headache   . History of blood transfusion   . History of kidney stones    passed  . Hypertension   . Ischemic cardiomyopathy    by echo 2014  . Neuropathy   . Pneumonia 09/2016  . Type II diabetes mellitus (HCC) 1995    Past Surgical History:  Procedure Laterality Date  . ABDOMINAL AORTOGRAM W/LOWER EXTREMITY Right 04/03/2020   Procedure: ABDOMINAL AORTOGRAM W/LOWER EXTREMITY;  Surgeon: ,  J, MD;  [facility]: [facility];  Service: Cardiovascular;  Laterality: Right;  . AMPUTATION Left 02/06/2013   Procedure: AMPUTATION LEFT GREAT TOE;  Surgeon: John Hewitt, MD;  Location: [facility] OR;  Service: Orthopedics;  Laterality: Left;  . AMPUTATION Left 06/24/2013   Procedure: AMPUTATION BELOW KNEE ;  Surgeon: John Hewitt, MD;  Location: [facility] OR;  Service: Orthopedics;  Laterality: Left;  . AMPUTATION Right 04/10/2020    Procedure: RIGHT AMPUTATION BELOW KNEE;  Surgeon: Cain, Brandon , MD;  Location: [facility] OR;  Service: Vascular;  Laterality: Right;  . AV FISTULA PLACEMENT Right 02/08/2017   Procedure: CREATION OF RIGHT ARM  BASILIC VEIN TO BRACHIAL ARTERY ARTERIOVENOUS (AV) FISTULA;  Surgeon: Early, Todd F, MD;  Location: [facility] OR;  Service: Vascular;  Laterality: Right;  . AV FISTULA PLACEMENT Left 09/10/2017   Procedure: INSERTION OF ARTERIOVENOUS (AV) GORE-TEX GRAFT  LEFT UPPER ARM;  Surgeon: Dickson,  S, MD;  Location: [facility] OR;  Service: Vascular;  Laterality: Left;  . BASCILIC VEIN TRANSPOSITION Right 04/14/2017   Procedure: BASCILIC VEIN TRANSPOSITION-RIGHT 2ND STAGE;  Surgeon: Early, Todd F, MD;  Location: [facility] OR;  Service: Vascular;  Laterality: Right;  . CESAREAN SECTION  10/18/2006  . INSERTION OF DIALYSIS CATHETER    . PERIPHERAL VASCULAR BALLOON ANGIOPLASTY Right 04/03/2020   Procedure: PERIPHERAL VASCULAR BALLOON ANGIOPLASTY;  Surgeon: ,  J, MD;  [facility];  Service: Cardiovascular;  Laterality: Right;  SFA  . WOUND DEBRIDEMENT Right 04/04/2020   Procedure: RIGHT FOOT DEBRIDEMENT;  Surgeon: Cain, Brandon , MD;  Location: [facility] OR;  Service: Vascular;  Laterality: Right;    Family History  Problem Relation Age of Onset  . Hypertension Mother   . Diabetes Mother   . Diabetes Father     SOCIAL HISTORY: Social History   Tobacco Use  . Smoking status: Former Smoker      Packs/day: 3.00    Years: 10.00    Pack years: 30.00    Types: Cigarettes    Quit date: 09/21/2004    Years since quitting: 15.6  . Smokeless tobacco: Never Used  Substance Use Topics  . Alcohol use: No    Allergies  Allergen Reactions  . Baclofen Itching  . Penicillins Anaphylaxis, Hives, Rash and Other (See Comments)    PATIENT HAD A PCN REACTION WITH IMMEDIATE RASH, FACIAL/TONGUE/THROAT SWELLING, SOB, OR LIGHTHEADEDNESS WITH HYPOTENSION:  #  #  #  YES  #  #  #     SEVERE RASH INVOLVING MUCUS MEMBRANES or SKIN NECROSIS: #  #  #  YES  #  #  # Has patient had a PCN reaction that required hospitalization No Has patient had a PCN reaction occurring within the last 10 years: No If all of the above answers are "NO", then may proceed with Cephalosporin use.  09/10/16- tolerated Cefepime   . Morphine And Related Hives and Rash  . Novolog [Insulin Aspart] Other (See Comments)    Cramps/ Gi distress    Current Outpatient Medications  Medication Sig Dispense Refill  . amLODipine (NORVASC) 10 MG tablet Take 10 mg by mouth daily.    Marland Kitchen aspirin EC 81 MG tablet Take 81 mg by mouth daily. Swallow whole.    Marland Kitchen atorvastatin (LIPITOR) 40 MG tablet Take 40 mg by mouth daily.     . calcitRIOL (ROCALTROL) 0.5 MCG capsule Take 3 capsules (1.5 mcg total) by mouth Every Tuesday,Thursday,and Saturday with dialysis. (Patient taking differently: Take 1.5 mcg by mouth every Monday, Wednesday, and Friday. ) 90 capsule 0  . calcium carbonate (OS-CAL) 1250 (500 Ca) MG chewable tablet Chew 1 tablet by mouth 3 (three) times daily.     . cinacalcet (SENSIPAR) 30 MG tablet Take 1 tablet (30 mg total) by mouth Every Tuesday,Thursday,and Saturday with dialysis. (Patient taking differently: Take 30 mg by mouth every Monday, Wednesday, and Friday with hemodialysis. ) 60 tablet 0  . clopidogrel (PLAVIX) 75 MG tablet Take 1 tablet (75 mg total) by mouth daily with breakfast. 30 tablet 0  . Darbepoetin Alfa (ARANESP) 200 MCG/0.4ML SOSY injection Inject 0.4 mLs (200 mcg total) into the vein every Thursday with hemodialysis. 1.68 mL   . ferric gluconate 62.5 mg in sodium chloride 0.9 % 100 mL Inject 62.5 mg into the vein every Thursday with hemodialysis.    . ferrous sulfate 325 (65 FE) MG tablet Take 325 mg by mouth daily with breakfast.     . gabapentin (NEURONTIN) 100 MG capsule Take 100 mg by mouth every evening.     . hydrALAZINE (APRESOLINE) 25 MG tablet Take 25 mg by mouth 2 (two) times  daily.     . insulin glargine (LANTUS) 100 UNIT/ML injection Inject 18 Units into the skin at bedtime.    . insulin regular (HUMULIN R) 100 units/mL injection Inject 0-15 Units into the skin See admin instructions. Sliding Scale    . lidocaine-prilocaine (EMLA) cream Apply 1 application topically as directed.     . methocarbamol (ROBAXIN) 500 MG tablet Take 500 mg by mouth 2 (two) times daily.     . Methoxy PEG-Epoetin Beta (MIRCERA IJ) Mircera    . metoprolol succinate (TOPROL-XL) 25 MG 24 hr tablet Take 0.5 tablets (12.5 mg total) by mouth daily. 15 tablet 0  . multivitamin (RENA-VIT) TABS tablet Take 1 tablet by mouth at bedtime. 30 tablet 0  . naloxone (NARCAN) 4  MG/0.1ML LIQD nasal spray kit Place 0.4 mg into the nose once. Repeat after 3 minutes if no or minimal response    . NOVOFINE 32G X 6 MM MISC     . ondansetron (ZOFRAN) 4 MG tablet Take 4 mg by mouth daily as needed for nausea or vomiting.    . oxyCODONE (OXY IR/ROXICODONE) 5 MG immediate release tablet Take 1 tablet (5 mg total) by mouth every 4 (four) hours as needed for moderate pain or severe pain. 30 tablet 0  . pantoprazole (PROTONIX) 20 MG tablet TAKE 1 TABLET(20 MG) BY MOUTH EVERY DAY (Patient taking differently: Take 20 mg by mouth daily. ) 90 tablet 2  . polyethylene glycol (MIRALAX / GLYCOLAX) packet Take 17 g by mouth daily. 14 each 0  . sevelamer carbonate (RENVELA) 800 MG tablet Take 800 mg by mouth 3 (three) times daily with meals.     No current facility-administered medications for this visit.    REVIEW OF SYSTEMS:  [X] denotes positive finding, [ ] denotes negative finding Cardiac  Comments:  Chest pain or chest pressure:    Shortness of breath upon exertion:    Short of breath when lying flat:    Irregular heart rhythm:        Vascular    Pain in calf, thigh, or hip brought on by ambulation:    Pain in feet at night that wakes you up from your sleep:     Blood clot in your veins:    Leg swelling:          Pulmonary    Oxygen at home:    Productive cough:     Wheezing:         Neurologic    Sudden weakness in arms or legs:     Sudden numbness in arms or legs:     Sudden onset of difficulty speaking or slurred speech:    Temporary loss of vision in one eye:     Problems with dizziness:         Gastrointestinal    Blood in stool:     Vomited blood:         Genitourinary    Burning when urinating:     Blood in urine:        Psychiatric    Major depression:         Hematologic    Bleeding problems:    Problems with blood clotting too easily:        Skin    Rashes or ulcers:        Constitutional    Fever or chills:      PHYSICAL EXAM: Vitals:   05/14/20 1550  BP: (!) 183/92  Pulse: 95  Resp: 14  Temp: 98.4 F (36.9 C)  TempSrc: Temporal  SpO2: 98%    GENERAL: The patient is a well-nourished female, in no acute distress. The vital signs are documented above. CARDIAC: There is a regular rate and rhythm.  VASCULAR:  Right BKA clean dry and intact Staples removed. Previous Left BKA well-healed  DATA:   None  Assessment/Plan:  44-year-old female status post right BKA for critical limb ischemia with tissue loss in setting of large necrotic right heel wound.  BKA seems to be healing well and staples were removed today.  I did give her a prescription for biotech to get a stump shrinker and evaluation for any prosthesis in the future given her young age.  She now has bilateral   BKA's and understands this may be difficult.  Follow-up as needed    J. , MD [facility] [place] Office: [phone]      

## 2020-05-16 DIAGNOSIS — G894 Chronic pain syndrome: Secondary | ICD-10-CM | POA: Diagnosis not present

## 2020-05-16 DIAGNOSIS — I5020 Unspecified systolic (congestive) heart failure: Secondary | ICD-10-CM | POA: Diagnosis not present

## 2020-05-16 DIAGNOSIS — K5900 Constipation, unspecified: Secondary | ICD-10-CM | POA: Diagnosis not present

## 2020-05-16 DIAGNOSIS — N186 End stage renal disease: Secondary | ICD-10-CM | POA: Diagnosis not present

## 2020-05-18 DIAGNOSIS — Z992 Dependence on renal dialysis: Secondary | ICD-10-CM | POA: Diagnosis not present

## 2020-05-18 DIAGNOSIS — N2581 Secondary hyperparathyroidism of renal origin: Secondary | ICD-10-CM | POA: Diagnosis not present

## 2020-05-18 DIAGNOSIS — N186 End stage renal disease: Secondary | ICD-10-CM | POA: Diagnosis not present

## 2020-05-18 DIAGNOSIS — D509 Iron deficiency anemia, unspecified: Secondary | ICD-10-CM | POA: Diagnosis not present

## 2020-05-18 DIAGNOSIS — D631 Anemia in chronic kidney disease: Secondary | ICD-10-CM | POA: Diagnosis not present

## 2020-05-18 DIAGNOSIS — E1129 Type 2 diabetes mellitus with other diabetic kidney complication: Secondary | ICD-10-CM | POA: Diagnosis not present

## 2020-05-20 DIAGNOSIS — R471 Dysarthria and anarthria: Secondary | ICD-10-CM | POA: Diagnosis not present

## 2020-05-20 DIAGNOSIS — I10 Essential (primary) hypertension: Secondary | ICD-10-CM | POA: Diagnosis not present

## 2020-05-20 DIAGNOSIS — N186 End stage renal disease: Secondary | ICD-10-CM | POA: Diagnosis not present

## 2020-05-20 DIAGNOSIS — E119 Type 2 diabetes mellitus without complications: Secondary | ICD-10-CM | POA: Diagnosis not present

## 2020-05-22 DIAGNOSIS — Z992 Dependence on renal dialysis: Secondary | ICD-10-CM | POA: Diagnosis not present

## 2020-05-22 DIAGNOSIS — N186 End stage renal disease: Secondary | ICD-10-CM | POA: Diagnosis not present

## 2020-05-22 DIAGNOSIS — E1122 Type 2 diabetes mellitus with diabetic chronic kidney disease: Secondary | ICD-10-CM | POA: Diagnosis not present

## 2020-05-23 DIAGNOSIS — E669 Obesity, unspecified: Secondary | ICD-10-CM | POA: Diagnosis not present

## 2020-05-23 DIAGNOSIS — G3184 Mild cognitive impairment, so stated: Secondary | ICD-10-CM | POA: Diagnosis not present

## 2020-05-23 DIAGNOSIS — I69391 Dysphagia following cerebral infarction: Secondary | ICD-10-CM | POA: Diagnosis not present

## 2020-05-23 DIAGNOSIS — E1151 Type 2 diabetes mellitus with diabetic peripheral angiopathy without gangrene: Secondary | ICD-10-CM | POA: Diagnosis not present

## 2020-05-23 DIAGNOSIS — M47816 Spondylosis without myelopathy or radiculopathy, lumbar region: Secondary | ICD-10-CM | POA: Diagnosis not present

## 2020-05-23 DIAGNOSIS — E1122 Type 2 diabetes mellitus with diabetic chronic kidney disease: Secondary | ICD-10-CM | POA: Diagnosis not present

## 2020-05-23 DIAGNOSIS — R1310 Dysphagia, unspecified: Secondary | ICD-10-CM | POA: Diagnosis not present

## 2020-05-23 DIAGNOSIS — N186 End stage renal disease: Secondary | ICD-10-CM | POA: Diagnosis not present

## 2020-05-23 DIAGNOSIS — Z992 Dependence on renal dialysis: Secondary | ICD-10-CM | POA: Diagnosis not present

## 2020-05-23 DIAGNOSIS — D631 Anemia in chronic kidney disease: Secondary | ICD-10-CM | POA: Diagnosis not present

## 2020-05-23 DIAGNOSIS — F329 Major depressive disorder, single episode, unspecified: Secondary | ICD-10-CM | POA: Diagnosis not present

## 2020-05-23 DIAGNOSIS — G894 Chronic pain syndrome: Secondary | ICD-10-CM | POA: Diagnosis not present

## 2020-05-23 DIAGNOSIS — Z6834 Body mass index (BMI) 34.0-34.9, adult: Secondary | ICD-10-CM | POA: Diagnosis not present

## 2020-05-23 DIAGNOSIS — I255 Ischemic cardiomyopathy: Secondary | ICD-10-CM | POA: Diagnosis not present

## 2020-05-23 DIAGNOSIS — Z794 Long term (current) use of insulin: Secondary | ICD-10-CM | POA: Diagnosis not present

## 2020-05-23 DIAGNOSIS — L89322 Pressure ulcer of left buttock, stage 2: Secondary | ICD-10-CM | POA: Diagnosis not present

## 2020-05-23 DIAGNOSIS — F17211 Nicotine dependence, cigarettes, in remission: Secondary | ICD-10-CM | POA: Diagnosis not present

## 2020-05-23 DIAGNOSIS — I132 Hypertensive heart and chronic kidney disease with heart failure and with stage 5 chronic kidney disease, or end stage renal disease: Secondary | ICD-10-CM | POA: Diagnosis not present

## 2020-05-23 DIAGNOSIS — I5022 Chronic systolic (congestive) heart failure: Secondary | ICD-10-CM | POA: Diagnosis not present

## 2020-05-23 DIAGNOSIS — F419 Anxiety disorder, unspecified: Secondary | ICD-10-CM | POA: Diagnosis not present

## 2020-05-23 DIAGNOSIS — Z4781 Encounter for orthopedic aftercare following surgical amputation: Secondary | ICD-10-CM | POA: Diagnosis not present

## 2020-05-23 DIAGNOSIS — E1142 Type 2 diabetes mellitus with diabetic polyneuropathy: Secondary | ICD-10-CM | POA: Diagnosis not present

## 2020-05-23 DIAGNOSIS — E46 Unspecified protein-calorie malnutrition: Secondary | ICD-10-CM | POA: Diagnosis not present

## 2020-05-23 DIAGNOSIS — Z89511 Acquired absence of right leg below knee: Secondary | ICD-10-CM | POA: Diagnosis not present

## 2020-05-23 DIAGNOSIS — Z89512 Acquired absence of left leg below knee: Secondary | ICD-10-CM | POA: Diagnosis not present

## 2020-05-24 DIAGNOSIS — E1151 Type 2 diabetes mellitus with diabetic peripheral angiopathy without gangrene: Secondary | ICD-10-CM | POA: Diagnosis not present

## 2020-05-24 DIAGNOSIS — I132 Hypertensive heart and chronic kidney disease with heart failure and with stage 5 chronic kidney disease, or end stage renal disease: Secondary | ICD-10-CM | POA: Diagnosis not present

## 2020-05-24 DIAGNOSIS — I5022 Chronic systolic (congestive) heart failure: Secondary | ICD-10-CM | POA: Diagnosis not present

## 2020-05-24 DIAGNOSIS — I255 Ischemic cardiomyopathy: Secondary | ICD-10-CM | POA: Diagnosis not present

## 2020-05-24 DIAGNOSIS — E1142 Type 2 diabetes mellitus with diabetic polyneuropathy: Secondary | ICD-10-CM | POA: Diagnosis not present

## 2020-05-24 DIAGNOSIS — Z4781 Encounter for orthopedic aftercare following surgical amputation: Secondary | ICD-10-CM | POA: Diagnosis not present

## 2020-05-25 DIAGNOSIS — N2581 Secondary hyperparathyroidism of renal origin: Secondary | ICD-10-CM | POA: Diagnosis not present

## 2020-05-25 DIAGNOSIS — D509 Iron deficiency anemia, unspecified: Secondary | ICD-10-CM | POA: Diagnosis not present

## 2020-05-25 DIAGNOSIS — Z992 Dependence on renal dialysis: Secondary | ICD-10-CM | POA: Diagnosis not present

## 2020-05-25 DIAGNOSIS — N186 End stage renal disease: Secondary | ICD-10-CM | POA: Diagnosis not present

## 2020-05-25 DIAGNOSIS — D631 Anemia in chronic kidney disease: Secondary | ICD-10-CM | POA: Diagnosis not present

## 2020-05-28 DIAGNOSIS — I5022 Chronic systolic (congestive) heart failure: Secondary | ICD-10-CM | POA: Diagnosis not present

## 2020-05-28 DIAGNOSIS — E1142 Type 2 diabetes mellitus with diabetic polyneuropathy: Secondary | ICD-10-CM | POA: Diagnosis not present

## 2020-05-28 DIAGNOSIS — I255 Ischemic cardiomyopathy: Secondary | ICD-10-CM | POA: Diagnosis not present

## 2020-05-28 DIAGNOSIS — I132 Hypertensive heart and chronic kidney disease with heart failure and with stage 5 chronic kidney disease, or end stage renal disease: Secondary | ICD-10-CM | POA: Diagnosis not present

## 2020-05-28 DIAGNOSIS — E1151 Type 2 diabetes mellitus with diabetic peripheral angiopathy without gangrene: Secondary | ICD-10-CM | POA: Diagnosis not present

## 2020-05-28 DIAGNOSIS — Z4781 Encounter for orthopedic aftercare following surgical amputation: Secondary | ICD-10-CM | POA: Diagnosis not present

## 2020-05-29 DIAGNOSIS — D509 Iron deficiency anemia, unspecified: Secondary | ICD-10-CM | POA: Diagnosis not present

## 2020-05-29 DIAGNOSIS — Z992 Dependence on renal dialysis: Secondary | ICD-10-CM | POA: Diagnosis not present

## 2020-05-29 DIAGNOSIS — N186 End stage renal disease: Secondary | ICD-10-CM | POA: Diagnosis not present

## 2020-05-29 DIAGNOSIS — N2581 Secondary hyperparathyroidism of renal origin: Secondary | ICD-10-CM | POA: Diagnosis not present

## 2020-05-29 DIAGNOSIS — D631 Anemia in chronic kidney disease: Secondary | ICD-10-CM | POA: Diagnosis not present

## 2020-06-02 DIAGNOSIS — E1122 Type 2 diabetes mellitus with diabetic chronic kidney disease: Secondary | ICD-10-CM | POA: Diagnosis not present

## 2020-06-02 DIAGNOSIS — I480 Paroxysmal atrial fibrillation: Secondary | ICD-10-CM | POA: Diagnosis not present

## 2020-06-02 DIAGNOSIS — R52 Pain, unspecified: Secondary | ICD-10-CM | POA: Diagnosis not present

## 2020-06-02 DIAGNOSIS — I132 Hypertensive heart and chronic kidney disease with heart failure and with stage 5 chronic kidney disease, or end stage renal disease: Secondary | ICD-10-CM | POA: Diagnosis not present

## 2020-06-02 DIAGNOSIS — F419 Anxiety disorder, unspecified: Secondary | ICD-10-CM | POA: Diagnosis not present

## 2020-06-02 DIAGNOSIS — N186 End stage renal disease: Secondary | ICD-10-CM | POA: Diagnosis not present

## 2020-06-02 DIAGNOSIS — Z89512 Acquired absence of left leg below knee: Secondary | ICD-10-CM | POA: Diagnosis not present

## 2020-06-02 DIAGNOSIS — E785 Hyperlipidemia, unspecified: Secondary | ICD-10-CM | POA: Diagnosis not present

## 2020-06-02 DIAGNOSIS — R0902 Hypoxemia: Secondary | ICD-10-CM | POA: Diagnosis not present

## 2020-06-02 DIAGNOSIS — Z992 Dependence on renal dialysis: Secondary | ICD-10-CM | POA: Diagnosis not present

## 2020-06-02 DIAGNOSIS — Z794 Long term (current) use of insulin: Secondary | ICD-10-CM | POA: Diagnosis not present

## 2020-06-02 DIAGNOSIS — I5032 Chronic diastolic (congestive) heart failure: Secondary | ICD-10-CM | POA: Diagnosis not present

## 2020-06-02 DIAGNOSIS — Z89511 Acquired absence of right leg below knee: Secondary | ICD-10-CM | POA: Diagnosis not present

## 2020-06-02 DIAGNOSIS — H6591 Unspecified nonsuppurative otitis media, right ear: Secondary | ICD-10-CM | POA: Diagnosis not present

## 2020-06-02 DIAGNOSIS — I69322 Dysarthria following cerebral infarction: Secondary | ICD-10-CM | POA: Diagnosis not present

## 2020-06-03 DIAGNOSIS — Z89511 Acquired absence of right leg below knee: Secondary | ICD-10-CM | POA: Diagnosis not present

## 2020-06-03 DIAGNOSIS — H6121 Impacted cerumen, right ear: Secondary | ICD-10-CM | POA: Diagnosis not present

## 2020-06-03 DIAGNOSIS — R5381 Other malaise: Secondary | ICD-10-CM | POA: Diagnosis not present

## 2020-06-03 DIAGNOSIS — E1122 Type 2 diabetes mellitus with diabetic chronic kidney disease: Secondary | ICD-10-CM | POA: Diagnosis not present

## 2020-06-03 DIAGNOSIS — I132 Hypertensive heart and chronic kidney disease with heart failure and with stage 5 chronic kidney disease, or end stage renal disease: Secondary | ICD-10-CM | POA: Diagnosis not present

## 2020-06-03 DIAGNOSIS — F411 Generalized anxiety disorder: Secondary | ICD-10-CM | POA: Diagnosis not present

## 2020-06-03 DIAGNOSIS — H9201 Otalgia, right ear: Secondary | ICD-10-CM | POA: Diagnosis not present

## 2020-06-03 DIAGNOSIS — Z89512 Acquired absence of left leg below knee: Secondary | ICD-10-CM | POA: Diagnosis not present

## 2020-06-03 DIAGNOSIS — R52 Pain, unspecified: Secondary | ICD-10-CM | POA: Diagnosis not present

## 2020-06-03 DIAGNOSIS — Z79899 Other long term (current) drug therapy: Secondary | ICD-10-CM | POA: Diagnosis not present

## 2020-06-03 DIAGNOSIS — Z794 Long term (current) use of insulin: Secondary | ICD-10-CM | POA: Diagnosis not present

## 2020-06-03 DIAGNOSIS — Z7982 Long term (current) use of aspirin: Secondary | ICD-10-CM | POA: Diagnosis not present

## 2020-06-03 DIAGNOSIS — M2550 Pain in unspecified joint: Secondary | ICD-10-CM | POA: Diagnosis not present

## 2020-06-03 DIAGNOSIS — R531 Weakness: Secondary | ICD-10-CM | POA: Diagnosis not present

## 2020-06-03 DIAGNOSIS — R110 Nausea: Secondary | ICD-10-CM | POA: Diagnosis not present

## 2020-06-03 DIAGNOSIS — J449 Chronic obstructive pulmonary disease, unspecified: Secondary | ICD-10-CM | POA: Diagnosis not present

## 2020-06-03 DIAGNOSIS — N186 End stage renal disease: Secondary | ICD-10-CM | POA: Diagnosis not present

## 2020-06-03 DIAGNOSIS — R1111 Vomiting without nausea: Secondary | ICD-10-CM | POA: Diagnosis not present

## 2020-06-03 DIAGNOSIS — Z7401 Bed confinement status: Secondary | ICD-10-CM | POA: Diagnosis not present

## 2020-06-03 DIAGNOSIS — I509 Heart failure, unspecified: Secondary | ICD-10-CM | POA: Diagnosis not present

## 2020-06-05 DIAGNOSIS — Z992 Dependence on renal dialysis: Secondary | ICD-10-CM | POA: Diagnosis not present

## 2020-06-05 DIAGNOSIS — N186 End stage renal disease: Secondary | ICD-10-CM | POA: Diagnosis not present

## 2020-06-05 DIAGNOSIS — D631 Anemia in chronic kidney disease: Secondary | ICD-10-CM | POA: Diagnosis not present

## 2020-06-05 DIAGNOSIS — N2581 Secondary hyperparathyroidism of renal origin: Secondary | ICD-10-CM | POA: Diagnosis not present

## 2020-06-05 DIAGNOSIS — D509 Iron deficiency anemia, unspecified: Secondary | ICD-10-CM | POA: Diagnosis not present

## 2020-06-06 DIAGNOSIS — I5022 Chronic systolic (congestive) heart failure: Secondary | ICD-10-CM | POA: Diagnosis not present

## 2020-06-06 DIAGNOSIS — I255 Ischemic cardiomyopathy: Secondary | ICD-10-CM | POA: Diagnosis not present

## 2020-06-06 DIAGNOSIS — Z4781 Encounter for orthopedic aftercare following surgical amputation: Secondary | ICD-10-CM | POA: Diagnosis not present

## 2020-06-06 DIAGNOSIS — I132 Hypertensive heart and chronic kidney disease with heart failure and with stage 5 chronic kidney disease, or end stage renal disease: Secondary | ICD-10-CM | POA: Diagnosis not present

## 2020-06-06 DIAGNOSIS — E1142 Type 2 diabetes mellitus with diabetic polyneuropathy: Secondary | ICD-10-CM | POA: Diagnosis not present

## 2020-06-06 DIAGNOSIS — E1151 Type 2 diabetes mellitus with diabetic peripheral angiopathy without gangrene: Secondary | ICD-10-CM | POA: Diagnosis not present

## 2020-06-07 DIAGNOSIS — D509 Iron deficiency anemia, unspecified: Secondary | ICD-10-CM | POA: Diagnosis not present

## 2020-06-07 DIAGNOSIS — N2581 Secondary hyperparathyroidism of renal origin: Secondary | ICD-10-CM | POA: Diagnosis not present

## 2020-06-07 DIAGNOSIS — N186 End stage renal disease: Secondary | ICD-10-CM | POA: Diagnosis not present

## 2020-06-07 DIAGNOSIS — D631 Anemia in chronic kidney disease: Secondary | ICD-10-CM | POA: Diagnosis not present

## 2020-06-07 DIAGNOSIS — Z992 Dependence on renal dialysis: Secondary | ICD-10-CM | POA: Diagnosis not present

## 2020-06-10 DIAGNOSIS — E1142 Type 2 diabetes mellitus with diabetic polyneuropathy: Secondary | ICD-10-CM | POA: Diagnosis not present

## 2020-06-10 DIAGNOSIS — I132 Hypertensive heart and chronic kidney disease with heart failure and with stage 5 chronic kidney disease, or end stage renal disease: Secondary | ICD-10-CM | POA: Diagnosis not present

## 2020-06-10 DIAGNOSIS — Z4781 Encounter for orthopedic aftercare following surgical amputation: Secondary | ICD-10-CM | POA: Diagnosis not present

## 2020-06-10 DIAGNOSIS — E1151 Type 2 diabetes mellitus with diabetic peripheral angiopathy without gangrene: Secondary | ICD-10-CM | POA: Diagnosis not present

## 2020-06-10 DIAGNOSIS — I255 Ischemic cardiomyopathy: Secondary | ICD-10-CM | POA: Diagnosis not present

## 2020-06-10 DIAGNOSIS — I5022 Chronic systolic (congestive) heart failure: Secondary | ICD-10-CM | POA: Diagnosis not present

## 2020-06-12 DIAGNOSIS — N2581 Secondary hyperparathyroidism of renal origin: Secondary | ICD-10-CM | POA: Diagnosis not present

## 2020-06-12 DIAGNOSIS — E1151 Type 2 diabetes mellitus with diabetic peripheral angiopathy without gangrene: Secondary | ICD-10-CM | POA: Diagnosis not present

## 2020-06-12 DIAGNOSIS — I5022 Chronic systolic (congestive) heart failure: Secondary | ICD-10-CM | POA: Diagnosis not present

## 2020-06-12 DIAGNOSIS — D509 Iron deficiency anemia, unspecified: Secondary | ICD-10-CM | POA: Diagnosis not present

## 2020-06-12 DIAGNOSIS — Z4781 Encounter for orthopedic aftercare following surgical amputation: Secondary | ICD-10-CM | POA: Diagnosis not present

## 2020-06-12 DIAGNOSIS — I255 Ischemic cardiomyopathy: Secondary | ICD-10-CM | POA: Diagnosis not present

## 2020-06-12 DIAGNOSIS — E1142 Type 2 diabetes mellitus with diabetic polyneuropathy: Secondary | ICD-10-CM | POA: Diagnosis not present

## 2020-06-12 DIAGNOSIS — I132 Hypertensive heart and chronic kidney disease with heart failure and with stage 5 chronic kidney disease, or end stage renal disease: Secondary | ICD-10-CM | POA: Diagnosis not present

## 2020-06-12 DIAGNOSIS — D631 Anemia in chronic kidney disease: Secondary | ICD-10-CM | POA: Diagnosis not present

## 2020-06-12 DIAGNOSIS — N186 End stage renal disease: Secondary | ICD-10-CM | POA: Diagnosis not present

## 2020-06-12 DIAGNOSIS — Z992 Dependence on renal dialysis: Secondary | ICD-10-CM | POA: Diagnosis not present

## 2020-06-13 DIAGNOSIS — E1142 Type 2 diabetes mellitus with diabetic polyneuropathy: Secondary | ICD-10-CM | POA: Diagnosis not present

## 2020-06-13 DIAGNOSIS — I132 Hypertensive heart and chronic kidney disease with heart failure and with stage 5 chronic kidney disease, or end stage renal disease: Secondary | ICD-10-CM | POA: Diagnosis not present

## 2020-06-13 DIAGNOSIS — I5022 Chronic systolic (congestive) heart failure: Secondary | ICD-10-CM | POA: Diagnosis not present

## 2020-06-13 DIAGNOSIS — E1151 Type 2 diabetes mellitus with diabetic peripheral angiopathy without gangrene: Secondary | ICD-10-CM | POA: Diagnosis not present

## 2020-06-13 DIAGNOSIS — Z4781 Encounter for orthopedic aftercare following surgical amputation: Secondary | ICD-10-CM | POA: Diagnosis not present

## 2020-06-13 DIAGNOSIS — I255 Ischemic cardiomyopathy: Secondary | ICD-10-CM | POA: Diagnosis not present

## 2020-06-14 DIAGNOSIS — Z992 Dependence on renal dialysis: Secondary | ICD-10-CM | POA: Diagnosis not present

## 2020-06-14 DIAGNOSIS — D631 Anemia in chronic kidney disease: Secondary | ICD-10-CM | POA: Diagnosis not present

## 2020-06-14 DIAGNOSIS — D509 Iron deficiency anemia, unspecified: Secondary | ICD-10-CM | POA: Diagnosis not present

## 2020-06-14 DIAGNOSIS — N2581 Secondary hyperparathyroidism of renal origin: Secondary | ICD-10-CM | POA: Diagnosis not present

## 2020-06-14 DIAGNOSIS — N186 End stage renal disease: Secondary | ICD-10-CM | POA: Diagnosis not present

## 2020-06-17 DIAGNOSIS — E1151 Type 2 diabetes mellitus with diabetic peripheral angiopathy without gangrene: Secondary | ICD-10-CM | POA: Diagnosis not present

## 2020-06-17 DIAGNOSIS — E1142 Type 2 diabetes mellitus with diabetic polyneuropathy: Secondary | ICD-10-CM | POA: Diagnosis not present

## 2020-06-17 DIAGNOSIS — N2581 Secondary hyperparathyroidism of renal origin: Secondary | ICD-10-CM | POA: Diagnosis not present

## 2020-06-17 DIAGNOSIS — D631 Anemia in chronic kidney disease: Secondary | ICD-10-CM | POA: Diagnosis not present

## 2020-06-17 DIAGNOSIS — I132 Hypertensive heart and chronic kidney disease with heart failure and with stage 5 chronic kidney disease, or end stage renal disease: Secondary | ICD-10-CM | POA: Diagnosis not present

## 2020-06-17 DIAGNOSIS — I5022 Chronic systolic (congestive) heart failure: Secondary | ICD-10-CM | POA: Diagnosis not present

## 2020-06-17 DIAGNOSIS — I255 Ischemic cardiomyopathy: Secondary | ICD-10-CM | POA: Diagnosis not present

## 2020-06-17 DIAGNOSIS — D509 Iron deficiency anemia, unspecified: Secondary | ICD-10-CM | POA: Diagnosis not present

## 2020-06-17 DIAGNOSIS — N186 End stage renal disease: Secondary | ICD-10-CM | POA: Diagnosis not present

## 2020-06-17 DIAGNOSIS — Z4781 Encounter for orthopedic aftercare following surgical amputation: Secondary | ICD-10-CM | POA: Diagnosis not present

## 2020-06-17 DIAGNOSIS — Z992 Dependence on renal dialysis: Secondary | ICD-10-CM | POA: Diagnosis not present

## 2020-06-18 DIAGNOSIS — E1151 Type 2 diabetes mellitus with diabetic peripheral angiopathy without gangrene: Secondary | ICD-10-CM | POA: Diagnosis not present

## 2020-06-18 DIAGNOSIS — I132 Hypertensive heart and chronic kidney disease with heart failure and with stage 5 chronic kidney disease, or end stage renal disease: Secondary | ICD-10-CM | POA: Diagnosis not present

## 2020-06-18 DIAGNOSIS — I255 Ischemic cardiomyopathy: Secondary | ICD-10-CM | POA: Diagnosis not present

## 2020-06-18 DIAGNOSIS — E1142 Type 2 diabetes mellitus with diabetic polyneuropathy: Secondary | ICD-10-CM | POA: Diagnosis not present

## 2020-06-18 DIAGNOSIS — I5022 Chronic systolic (congestive) heart failure: Secondary | ICD-10-CM | POA: Diagnosis not present

## 2020-06-18 DIAGNOSIS — Z4781 Encounter for orthopedic aftercare following surgical amputation: Secondary | ICD-10-CM | POA: Diagnosis not present

## 2020-06-19 DIAGNOSIS — I255 Ischemic cardiomyopathy: Secondary | ICD-10-CM | POA: Diagnosis not present

## 2020-06-19 DIAGNOSIS — Z4781 Encounter for orthopedic aftercare following surgical amputation: Secondary | ICD-10-CM | POA: Diagnosis not present

## 2020-06-19 DIAGNOSIS — D631 Anemia in chronic kidney disease: Secondary | ICD-10-CM | POA: Diagnosis not present

## 2020-06-19 DIAGNOSIS — I5022 Chronic systolic (congestive) heart failure: Secondary | ICD-10-CM | POA: Diagnosis not present

## 2020-06-19 DIAGNOSIS — E1151 Type 2 diabetes mellitus with diabetic peripheral angiopathy without gangrene: Secondary | ICD-10-CM | POA: Diagnosis not present

## 2020-06-19 DIAGNOSIS — N2581 Secondary hyperparathyroidism of renal origin: Secondary | ICD-10-CM | POA: Diagnosis not present

## 2020-06-19 DIAGNOSIS — D509 Iron deficiency anemia, unspecified: Secondary | ICD-10-CM | POA: Diagnosis not present

## 2020-06-19 DIAGNOSIS — N186 End stage renal disease: Secondary | ICD-10-CM | POA: Diagnosis not present

## 2020-06-19 DIAGNOSIS — E1142 Type 2 diabetes mellitus with diabetic polyneuropathy: Secondary | ICD-10-CM | POA: Diagnosis not present

## 2020-06-19 DIAGNOSIS — I132 Hypertensive heart and chronic kidney disease with heart failure and with stage 5 chronic kidney disease, or end stage renal disease: Secondary | ICD-10-CM | POA: Diagnosis not present

## 2020-06-19 DIAGNOSIS — Z992 Dependence on renal dialysis: Secondary | ICD-10-CM | POA: Diagnosis not present

## 2020-06-20 DIAGNOSIS — I255 Ischemic cardiomyopathy: Secondary | ICD-10-CM | POA: Diagnosis not present

## 2020-06-20 DIAGNOSIS — E1151 Type 2 diabetes mellitus with diabetic peripheral angiopathy without gangrene: Secondary | ICD-10-CM | POA: Diagnosis not present

## 2020-06-20 DIAGNOSIS — E1142 Type 2 diabetes mellitus with diabetic polyneuropathy: Secondary | ICD-10-CM | POA: Diagnosis not present

## 2020-06-20 DIAGNOSIS — I132 Hypertensive heart and chronic kidney disease with heart failure and with stage 5 chronic kidney disease, or end stage renal disease: Secondary | ICD-10-CM | POA: Diagnosis not present

## 2020-06-20 DIAGNOSIS — I5022 Chronic systolic (congestive) heart failure: Secondary | ICD-10-CM | POA: Diagnosis not present

## 2020-06-20 DIAGNOSIS — Z4781 Encounter for orthopedic aftercare following surgical amputation: Secondary | ICD-10-CM | POA: Diagnosis not present

## 2020-06-21 DIAGNOSIS — N186 End stage renal disease: Secondary | ICD-10-CM | POA: Diagnosis not present

## 2020-06-21 DIAGNOSIS — Z992 Dependence on renal dialysis: Secondary | ICD-10-CM | POA: Diagnosis not present

## 2020-06-21 DIAGNOSIS — E1122 Type 2 diabetes mellitus with diabetic chronic kidney disease: Secondary | ICD-10-CM | POA: Diagnosis not present

## 2020-06-22 DIAGNOSIS — R1310 Dysphagia, unspecified: Secondary | ICD-10-CM | POA: Diagnosis not present

## 2020-06-22 DIAGNOSIS — Z794 Long term (current) use of insulin: Secondary | ICD-10-CM | POA: Diagnosis not present

## 2020-06-22 DIAGNOSIS — I255 Ischemic cardiomyopathy: Secondary | ICD-10-CM | POA: Diagnosis not present

## 2020-06-22 DIAGNOSIS — I5022 Chronic systolic (congestive) heart failure: Secondary | ICD-10-CM | POA: Diagnosis not present

## 2020-06-22 DIAGNOSIS — F329 Major depressive disorder, single episode, unspecified: Secondary | ICD-10-CM | POA: Diagnosis not present

## 2020-06-22 DIAGNOSIS — E669 Obesity, unspecified: Secondary | ICD-10-CM | POA: Diagnosis not present

## 2020-06-22 DIAGNOSIS — I132 Hypertensive heart and chronic kidney disease with heart failure and with stage 5 chronic kidney disease, or end stage renal disease: Secondary | ICD-10-CM | POA: Diagnosis not present

## 2020-06-22 DIAGNOSIS — D631 Anemia in chronic kidney disease: Secondary | ICD-10-CM | POA: Diagnosis not present

## 2020-06-22 DIAGNOSIS — E1122 Type 2 diabetes mellitus with diabetic chronic kidney disease: Secondary | ICD-10-CM | POA: Diagnosis not present

## 2020-06-22 DIAGNOSIS — Z89512 Acquired absence of left leg below knee: Secondary | ICD-10-CM | POA: Diagnosis not present

## 2020-06-22 DIAGNOSIS — E1142 Type 2 diabetes mellitus with diabetic polyneuropathy: Secondary | ICD-10-CM | POA: Diagnosis not present

## 2020-06-22 DIAGNOSIS — Z89511 Acquired absence of right leg below knee: Secondary | ICD-10-CM | POA: Diagnosis not present

## 2020-06-22 DIAGNOSIS — G3184 Mild cognitive impairment, so stated: Secondary | ICD-10-CM | POA: Diagnosis not present

## 2020-06-22 DIAGNOSIS — Z4781 Encounter for orthopedic aftercare following surgical amputation: Secondary | ICD-10-CM | POA: Diagnosis not present

## 2020-06-22 DIAGNOSIS — I69391 Dysphagia following cerebral infarction: Secondary | ICD-10-CM | POA: Diagnosis not present

## 2020-06-22 DIAGNOSIS — E46 Unspecified protein-calorie malnutrition: Secondary | ICD-10-CM | POA: Diagnosis not present

## 2020-06-22 DIAGNOSIS — L89322 Pressure ulcer of left buttock, stage 2: Secondary | ICD-10-CM | POA: Diagnosis not present

## 2020-06-22 DIAGNOSIS — E1151 Type 2 diabetes mellitus with diabetic peripheral angiopathy without gangrene: Secondary | ICD-10-CM | POA: Diagnosis not present

## 2020-06-22 DIAGNOSIS — F419 Anxiety disorder, unspecified: Secondary | ICD-10-CM | POA: Diagnosis not present

## 2020-06-22 DIAGNOSIS — Z992 Dependence on renal dialysis: Secondary | ICD-10-CM | POA: Diagnosis not present

## 2020-06-22 DIAGNOSIS — M47816 Spondylosis without myelopathy or radiculopathy, lumbar region: Secondary | ICD-10-CM | POA: Diagnosis not present

## 2020-06-22 DIAGNOSIS — Z6834 Body mass index (BMI) 34.0-34.9, adult: Secondary | ICD-10-CM | POA: Diagnosis not present

## 2020-06-22 DIAGNOSIS — N186 End stage renal disease: Secondary | ICD-10-CM | POA: Diagnosis not present

## 2020-06-22 DIAGNOSIS — G894 Chronic pain syndrome: Secondary | ICD-10-CM | POA: Diagnosis not present

## 2020-06-22 DIAGNOSIS — F17211 Nicotine dependence, cigarettes, in remission: Secondary | ICD-10-CM | POA: Diagnosis not present

## 2020-06-24 DIAGNOSIS — Z23 Encounter for immunization: Secondary | ICD-10-CM | POA: Diagnosis not present

## 2020-06-24 DIAGNOSIS — N2581 Secondary hyperparathyroidism of renal origin: Secondary | ICD-10-CM | POA: Diagnosis not present

## 2020-06-24 DIAGNOSIS — Z992 Dependence on renal dialysis: Secondary | ICD-10-CM | POA: Diagnosis not present

## 2020-06-24 DIAGNOSIS — D631 Anemia in chronic kidney disease: Secondary | ICD-10-CM | POA: Diagnosis not present

## 2020-06-24 DIAGNOSIS — N186 End stage renal disease: Secondary | ICD-10-CM | POA: Diagnosis not present

## 2020-06-24 DIAGNOSIS — D509 Iron deficiency anemia, unspecified: Secondary | ICD-10-CM | POA: Diagnosis not present

## 2020-06-26 DIAGNOSIS — Z4781 Encounter for orthopedic aftercare following surgical amputation: Secondary | ICD-10-CM | POA: Diagnosis not present

## 2020-06-26 DIAGNOSIS — N2581 Secondary hyperparathyroidism of renal origin: Secondary | ICD-10-CM | POA: Diagnosis not present

## 2020-06-26 DIAGNOSIS — E1151 Type 2 diabetes mellitus with diabetic peripheral angiopathy without gangrene: Secondary | ICD-10-CM | POA: Diagnosis not present

## 2020-06-26 DIAGNOSIS — E1142 Type 2 diabetes mellitus with diabetic polyneuropathy: Secondary | ICD-10-CM | POA: Diagnosis not present

## 2020-06-26 DIAGNOSIS — Z992 Dependence on renal dialysis: Secondary | ICD-10-CM | POA: Diagnosis not present

## 2020-06-26 DIAGNOSIS — N186 End stage renal disease: Secondary | ICD-10-CM | POA: Diagnosis not present

## 2020-06-26 DIAGNOSIS — I255 Ischemic cardiomyopathy: Secondary | ICD-10-CM | POA: Diagnosis not present

## 2020-06-26 DIAGNOSIS — D509 Iron deficiency anemia, unspecified: Secondary | ICD-10-CM | POA: Diagnosis not present

## 2020-06-26 DIAGNOSIS — I5022 Chronic systolic (congestive) heart failure: Secondary | ICD-10-CM | POA: Diagnosis not present

## 2020-06-26 DIAGNOSIS — I132 Hypertensive heart and chronic kidney disease with heart failure and with stage 5 chronic kidney disease, or end stage renal disease: Secondary | ICD-10-CM | POA: Diagnosis not present

## 2020-06-26 DIAGNOSIS — D631 Anemia in chronic kidney disease: Secondary | ICD-10-CM | POA: Diagnosis not present

## 2020-06-26 DIAGNOSIS — Z23 Encounter for immunization: Secondary | ICD-10-CM | POA: Diagnosis not present

## 2020-07-01 DIAGNOSIS — D631 Anemia in chronic kidney disease: Secondary | ICD-10-CM | POA: Diagnosis not present

## 2020-07-01 DIAGNOSIS — Z992 Dependence on renal dialysis: Secondary | ICD-10-CM | POA: Diagnosis not present

## 2020-07-01 DIAGNOSIS — N186 End stage renal disease: Secondary | ICD-10-CM | POA: Diagnosis not present

## 2020-07-01 DIAGNOSIS — Z23 Encounter for immunization: Secondary | ICD-10-CM | POA: Diagnosis not present

## 2020-07-01 DIAGNOSIS — D509 Iron deficiency anemia, unspecified: Secondary | ICD-10-CM | POA: Diagnosis not present

## 2020-07-01 DIAGNOSIS — N2581 Secondary hyperparathyroidism of renal origin: Secondary | ICD-10-CM | POA: Diagnosis not present

## 2020-07-03 DIAGNOSIS — N186 End stage renal disease: Secondary | ICD-10-CM | POA: Diagnosis not present

## 2020-07-03 DIAGNOSIS — E1129 Type 2 diabetes mellitus with other diabetic kidney complication: Secondary | ICD-10-CM | POA: Diagnosis not present

## 2020-07-03 DIAGNOSIS — D631 Anemia in chronic kidney disease: Secondary | ICD-10-CM | POA: Diagnosis not present

## 2020-07-03 DIAGNOSIS — Z23 Encounter for immunization: Secondary | ICD-10-CM | POA: Diagnosis not present

## 2020-07-03 DIAGNOSIS — Z992 Dependence on renal dialysis: Secondary | ICD-10-CM | POA: Diagnosis not present

## 2020-07-03 DIAGNOSIS — N2581 Secondary hyperparathyroidism of renal origin: Secondary | ICD-10-CM | POA: Diagnosis not present

## 2020-07-03 DIAGNOSIS — D509 Iron deficiency anemia, unspecified: Secondary | ICD-10-CM | POA: Diagnosis not present

## 2020-07-05 DIAGNOSIS — N2581 Secondary hyperparathyroidism of renal origin: Secondary | ICD-10-CM | POA: Diagnosis not present

## 2020-07-05 DIAGNOSIS — Z23 Encounter for immunization: Secondary | ICD-10-CM | POA: Diagnosis not present

## 2020-07-05 DIAGNOSIS — D509 Iron deficiency anemia, unspecified: Secondary | ICD-10-CM | POA: Diagnosis not present

## 2020-07-05 DIAGNOSIS — D631 Anemia in chronic kidney disease: Secondary | ICD-10-CM | POA: Diagnosis not present

## 2020-07-05 DIAGNOSIS — Z992 Dependence on renal dialysis: Secondary | ICD-10-CM | POA: Diagnosis not present

## 2020-07-05 DIAGNOSIS — N186 End stage renal disease: Secondary | ICD-10-CM | POA: Diagnosis not present

## 2020-07-08 DIAGNOSIS — Z992 Dependence on renal dialysis: Secondary | ICD-10-CM | POA: Diagnosis not present

## 2020-07-08 DIAGNOSIS — Z23 Encounter for immunization: Secondary | ICD-10-CM | POA: Diagnosis not present

## 2020-07-08 DIAGNOSIS — N186 End stage renal disease: Secondary | ICD-10-CM | POA: Diagnosis not present

## 2020-07-08 DIAGNOSIS — D509 Iron deficiency anemia, unspecified: Secondary | ICD-10-CM | POA: Diagnosis not present

## 2020-07-08 DIAGNOSIS — N2581 Secondary hyperparathyroidism of renal origin: Secondary | ICD-10-CM | POA: Diagnosis not present

## 2020-07-08 DIAGNOSIS — D631 Anemia in chronic kidney disease: Secondary | ICD-10-CM | POA: Diagnosis not present

## 2020-07-10 DIAGNOSIS — E1151 Type 2 diabetes mellitus with diabetic peripheral angiopathy without gangrene: Secondary | ICD-10-CM | POA: Diagnosis not present

## 2020-07-10 DIAGNOSIS — I255 Ischemic cardiomyopathy: Secondary | ICD-10-CM | POA: Diagnosis not present

## 2020-07-10 DIAGNOSIS — E1142 Type 2 diabetes mellitus with diabetic polyneuropathy: Secondary | ICD-10-CM | POA: Diagnosis not present

## 2020-07-10 DIAGNOSIS — I132 Hypertensive heart and chronic kidney disease with heart failure and with stage 5 chronic kidney disease, or end stage renal disease: Secondary | ICD-10-CM | POA: Diagnosis not present

## 2020-07-10 DIAGNOSIS — Z4781 Encounter for orthopedic aftercare following surgical amputation: Secondary | ICD-10-CM | POA: Diagnosis not present

## 2020-07-10 DIAGNOSIS — I5022 Chronic systolic (congestive) heart failure: Secondary | ICD-10-CM | POA: Diagnosis not present

## 2020-07-11 DIAGNOSIS — I10 Essential (primary) hypertension: Secondary | ICD-10-CM | POA: Diagnosis not present

## 2020-07-11 DIAGNOSIS — I5023 Acute on chronic systolic (congestive) heart failure: Secondary | ICD-10-CM | POA: Diagnosis not present

## 2020-07-11 DIAGNOSIS — Z89512 Acquired absence of left leg below knee: Secondary | ICD-10-CM | POA: Diagnosis not present

## 2020-07-11 DIAGNOSIS — I6319 Cerebral infarction due to embolism of other precerebral artery: Secondary | ICD-10-CM | POA: Diagnosis not present

## 2020-07-11 DIAGNOSIS — E0822 Diabetes mellitus due to underlying condition with diabetic chronic kidney disease: Secondary | ICD-10-CM | POA: Diagnosis not present

## 2020-07-11 DIAGNOSIS — H6121 Impacted cerumen, right ear: Secondary | ICD-10-CM | POA: Diagnosis not present

## 2020-07-11 DIAGNOSIS — E782 Mixed hyperlipidemia: Secondary | ICD-10-CM | POA: Diagnosis not present

## 2020-07-11 DIAGNOSIS — R5383 Other fatigue: Secondary | ICD-10-CM | POA: Diagnosis not present

## 2020-07-11 DIAGNOSIS — Z89511 Acquired absence of right leg below knee: Secondary | ICD-10-CM | POA: Diagnosis not present

## 2020-07-11 DIAGNOSIS — I132 Hypertensive heart and chronic kidney disease with heart failure and with stage 5 chronic kidney disease, or end stage renal disease: Secondary | ICD-10-CM | POA: Diagnosis not present

## 2020-07-11 DIAGNOSIS — Z992 Dependence on renal dialysis: Secondary | ICD-10-CM | POA: Diagnosis not present

## 2020-07-11 DIAGNOSIS — Z794 Long term (current) use of insulin: Secondary | ICD-10-CM | POA: Diagnosis not present

## 2020-07-11 DIAGNOSIS — R5381 Other malaise: Secondary | ICD-10-CM | POA: Diagnosis not present

## 2020-07-11 DIAGNOSIS — K219 Gastro-esophageal reflux disease without esophagitis: Secondary | ICD-10-CM | POA: Diagnosis not present

## 2020-07-11 DIAGNOSIS — N186 End stage renal disease: Secondary | ICD-10-CM | POA: Diagnosis not present

## 2020-07-12 DIAGNOSIS — D631 Anemia in chronic kidney disease: Secondary | ICD-10-CM | POA: Diagnosis not present

## 2020-07-12 DIAGNOSIS — Z4781 Encounter for orthopedic aftercare following surgical amputation: Secondary | ICD-10-CM | POA: Diagnosis not present

## 2020-07-12 DIAGNOSIS — I132 Hypertensive heart and chronic kidney disease with heart failure and with stage 5 chronic kidney disease, or end stage renal disease: Secondary | ICD-10-CM | POA: Diagnosis not present

## 2020-07-12 DIAGNOSIS — E1142 Type 2 diabetes mellitus with diabetic polyneuropathy: Secondary | ICD-10-CM | POA: Diagnosis not present

## 2020-07-12 DIAGNOSIS — I255 Ischemic cardiomyopathy: Secondary | ICD-10-CM | POA: Diagnosis not present

## 2020-07-12 DIAGNOSIS — D509 Iron deficiency anemia, unspecified: Secondary | ICD-10-CM | POA: Diagnosis not present

## 2020-07-12 DIAGNOSIS — Z992 Dependence on renal dialysis: Secondary | ICD-10-CM | POA: Diagnosis not present

## 2020-07-12 DIAGNOSIS — Z23 Encounter for immunization: Secondary | ICD-10-CM | POA: Diagnosis not present

## 2020-07-12 DIAGNOSIS — N2581 Secondary hyperparathyroidism of renal origin: Secondary | ICD-10-CM | POA: Diagnosis not present

## 2020-07-12 DIAGNOSIS — I5022 Chronic systolic (congestive) heart failure: Secondary | ICD-10-CM | POA: Diagnosis not present

## 2020-07-12 DIAGNOSIS — E1151 Type 2 diabetes mellitus with diabetic peripheral angiopathy without gangrene: Secondary | ICD-10-CM | POA: Diagnosis not present

## 2020-07-12 DIAGNOSIS — N186 End stage renal disease: Secondary | ICD-10-CM | POA: Diagnosis not present

## 2020-07-15 DIAGNOSIS — N2581 Secondary hyperparathyroidism of renal origin: Secondary | ICD-10-CM | POA: Diagnosis not present

## 2020-07-15 DIAGNOSIS — N186 End stage renal disease: Secondary | ICD-10-CM | POA: Diagnosis not present

## 2020-07-15 DIAGNOSIS — Z23 Encounter for immunization: Secondary | ICD-10-CM | POA: Diagnosis not present

## 2020-07-15 DIAGNOSIS — D509 Iron deficiency anemia, unspecified: Secondary | ICD-10-CM | POA: Diagnosis not present

## 2020-07-15 DIAGNOSIS — Z992 Dependence on renal dialysis: Secondary | ICD-10-CM | POA: Diagnosis not present

## 2020-07-15 DIAGNOSIS — D631 Anemia in chronic kidney disease: Secondary | ICD-10-CM | POA: Diagnosis not present

## 2020-07-17 DIAGNOSIS — E1142 Type 2 diabetes mellitus with diabetic polyneuropathy: Secondary | ICD-10-CM | POA: Diagnosis not present

## 2020-07-17 DIAGNOSIS — I5022 Chronic systolic (congestive) heart failure: Secondary | ICD-10-CM | POA: Diagnosis not present

## 2020-07-17 DIAGNOSIS — I255 Ischemic cardiomyopathy: Secondary | ICD-10-CM | POA: Diagnosis not present

## 2020-07-17 DIAGNOSIS — E1151 Type 2 diabetes mellitus with diabetic peripheral angiopathy without gangrene: Secondary | ICD-10-CM | POA: Diagnosis not present

## 2020-07-17 DIAGNOSIS — I132 Hypertensive heart and chronic kidney disease with heart failure and with stage 5 chronic kidney disease, or end stage renal disease: Secondary | ICD-10-CM | POA: Diagnosis not present

## 2020-07-17 DIAGNOSIS — Z4781 Encounter for orthopedic aftercare following surgical amputation: Secondary | ICD-10-CM | POA: Diagnosis not present

## 2020-07-19 DIAGNOSIS — R52 Pain, unspecified: Secondary | ICD-10-CM | POA: Diagnosis not present

## 2020-07-19 DIAGNOSIS — Z79899 Other long term (current) drug therapy: Secondary | ICD-10-CM | POA: Diagnosis not present

## 2020-07-19 DIAGNOSIS — I120 Hypertensive chronic kidney disease with stage 5 chronic kidney disease or end stage renal disease: Secondary | ICD-10-CM | POA: Diagnosis not present

## 2020-07-19 DIAGNOSIS — I10 Essential (primary) hypertension: Secondary | ICD-10-CM | POA: Diagnosis not present

## 2020-07-19 DIAGNOSIS — E785 Hyperlipidemia, unspecified: Secondary | ICD-10-CM | POA: Diagnosis not present

## 2020-07-19 DIAGNOSIS — Z7902 Long term (current) use of antithrombotics/antiplatelets: Secondary | ICD-10-CM | POA: Diagnosis not present

## 2020-07-19 DIAGNOSIS — Z7982 Long term (current) use of aspirin: Secondary | ICD-10-CM | POA: Diagnosis not present

## 2020-07-19 DIAGNOSIS — R109 Unspecified abdominal pain: Secondary | ICD-10-CM | POA: Diagnosis not present

## 2020-07-19 DIAGNOSIS — N186 End stage renal disease: Secondary | ICD-10-CM | POA: Diagnosis not present

## 2020-07-19 DIAGNOSIS — J9611 Chronic respiratory failure with hypoxia: Secondary | ICD-10-CM | POA: Diagnosis not present

## 2020-07-19 DIAGNOSIS — R0902 Hypoxemia: Secondary | ICD-10-CM | POA: Diagnosis not present

## 2020-07-19 DIAGNOSIS — R9431 Abnormal electrocardiogram [ECG] [EKG]: Secondary | ICD-10-CM | POA: Diagnosis not present

## 2020-07-19 DIAGNOSIS — K529 Noninfective gastroenteritis and colitis, unspecified: Secondary | ICD-10-CM | POA: Diagnosis not present

## 2020-07-19 DIAGNOSIS — Z992 Dependence on renal dialysis: Secondary | ICD-10-CM | POA: Diagnosis not present

## 2020-07-19 DIAGNOSIS — I1311 Hypertensive heart and chronic kidney disease without heart failure, with stage 5 chronic kidney disease, or end stage renal disease: Secondary | ICD-10-CM | POA: Diagnosis not present

## 2020-07-19 DIAGNOSIS — Z794 Long term (current) use of insulin: Secondary | ICD-10-CM | POA: Diagnosis not present

## 2020-07-19 DIAGNOSIS — Z87891 Personal history of nicotine dependence: Secondary | ICD-10-CM | POA: Diagnosis not present

## 2020-07-19 DIAGNOSIS — K219 Gastro-esophageal reflux disease without esophagitis: Secondary | ICD-10-CM | POA: Diagnosis not present

## 2020-07-19 DIAGNOSIS — R1084 Generalized abdominal pain: Secondary | ICD-10-CM | POA: Diagnosis not present

## 2020-07-19 DIAGNOSIS — I69322 Dysarthria following cerebral infarction: Secondary | ICD-10-CM | POA: Diagnosis not present

## 2020-07-19 DIAGNOSIS — E1122 Type 2 diabetes mellitus with diabetic chronic kidney disease: Secondary | ICD-10-CM | POA: Diagnosis not present

## 2020-07-19 DIAGNOSIS — Z20822 Contact with and (suspected) exposure to covid-19: Secondary | ICD-10-CM | POA: Diagnosis not present

## 2020-07-20 DIAGNOSIS — E1122 Type 2 diabetes mellitus with diabetic chronic kidney disease: Secondary | ICD-10-CM | POA: Diagnosis not present

## 2020-07-20 DIAGNOSIS — N186 End stage renal disease: Secondary | ICD-10-CM | POA: Diagnosis not present

## 2020-07-20 DIAGNOSIS — Z794 Long term (current) use of insulin: Secondary | ICD-10-CM | POA: Diagnosis not present

## 2020-07-20 DIAGNOSIS — E119 Type 2 diabetes mellitus without complications: Secondary | ICD-10-CM | POA: Diagnosis not present

## 2020-07-20 DIAGNOSIS — K529 Noninfective gastroenteritis and colitis, unspecified: Secondary | ICD-10-CM | POA: Diagnosis not present

## 2020-07-20 DIAGNOSIS — R1031 Right lower quadrant pain: Secondary | ICD-10-CM | POA: Diagnosis not present

## 2020-07-20 DIAGNOSIS — Z992 Dependence on renal dialysis: Secondary | ICD-10-CM | POA: Diagnosis not present

## 2020-07-20 DIAGNOSIS — I120 Hypertensive chronic kidney disease with stage 5 chronic kidney disease or end stage renal disease: Secondary | ICD-10-CM | POA: Diagnosis not present

## 2020-07-21 DIAGNOSIS — N186 End stage renal disease: Secondary | ICD-10-CM | POA: Diagnosis not present

## 2020-07-21 DIAGNOSIS — R1084 Generalized abdominal pain: Secondary | ICD-10-CM | POA: Diagnosis not present

## 2020-07-21 DIAGNOSIS — R52 Pain, unspecified: Secondary | ICD-10-CM | POA: Diagnosis not present

## 2020-07-21 DIAGNOSIS — R0902 Hypoxemia: Secondary | ICD-10-CM | POA: Diagnosis not present

## 2020-07-21 DIAGNOSIS — Z992 Dependence on renal dialysis: Secondary | ICD-10-CM | POA: Diagnosis not present

## 2020-07-21 DIAGNOSIS — R1031 Right lower quadrant pain: Secondary | ICD-10-CM | POA: Diagnosis not present

## 2020-07-21 DIAGNOSIS — I120 Hypertensive chronic kidney disease with stage 5 chronic kidney disease or end stage renal disease: Secondary | ICD-10-CM | POA: Diagnosis not present

## 2020-07-21 DIAGNOSIS — I517 Cardiomegaly: Secondary | ICD-10-CM | POA: Diagnosis not present

## 2020-07-21 DIAGNOSIS — Z7401 Bed confinement status: Secondary | ICD-10-CM | POA: Diagnosis not present

## 2020-07-21 DIAGNOSIS — E1122 Type 2 diabetes mellitus with diabetic chronic kidney disease: Secondary | ICD-10-CM | POA: Diagnosis not present

## 2020-07-21 DIAGNOSIS — M2550 Pain in unspecified joint: Secondary | ICD-10-CM | POA: Diagnosis not present

## 2020-07-21 DIAGNOSIS — R0602 Shortness of breath: Secondary | ICD-10-CM | POA: Diagnosis not present

## 2020-07-22 DIAGNOSIS — N186 End stage renal disease: Secondary | ICD-10-CM | POA: Diagnosis not present

## 2020-07-22 DIAGNOSIS — Z992 Dependence on renal dialysis: Secondary | ICD-10-CM | POA: Diagnosis not present

## 2020-07-22 DIAGNOSIS — D509 Iron deficiency anemia, unspecified: Secondary | ICD-10-CM | POA: Diagnosis not present

## 2020-07-22 DIAGNOSIS — N2581 Secondary hyperparathyroidism of renal origin: Secondary | ICD-10-CM | POA: Diagnosis not present

## 2020-07-22 DIAGNOSIS — D631 Anemia in chronic kidney disease: Secondary | ICD-10-CM | POA: Diagnosis not present

## 2020-07-22 DIAGNOSIS — E1122 Type 2 diabetes mellitus with diabetic chronic kidney disease: Secondary | ICD-10-CM | POA: Diagnosis not present

## 2020-07-26 DIAGNOSIS — D509 Iron deficiency anemia, unspecified: Secondary | ICD-10-CM | POA: Diagnosis not present

## 2020-07-26 DIAGNOSIS — Z992 Dependence on renal dialysis: Secondary | ICD-10-CM | POA: Diagnosis not present

## 2020-07-26 DIAGNOSIS — N186 End stage renal disease: Secondary | ICD-10-CM | POA: Diagnosis not present

## 2020-07-26 DIAGNOSIS — N2581 Secondary hyperparathyroidism of renal origin: Secondary | ICD-10-CM | POA: Diagnosis not present

## 2020-07-26 DIAGNOSIS — D631 Anemia in chronic kidney disease: Secondary | ICD-10-CM | POA: Diagnosis not present

## 2020-07-29 DIAGNOSIS — N2581 Secondary hyperparathyroidism of renal origin: Secondary | ICD-10-CM | POA: Diagnosis not present

## 2020-07-29 DIAGNOSIS — Z992 Dependence on renal dialysis: Secondary | ICD-10-CM | POA: Diagnosis not present

## 2020-07-29 DIAGNOSIS — N186 End stage renal disease: Secondary | ICD-10-CM | POA: Diagnosis not present

## 2020-07-29 DIAGNOSIS — D509 Iron deficiency anemia, unspecified: Secondary | ICD-10-CM | POA: Diagnosis not present

## 2020-07-29 DIAGNOSIS — D631 Anemia in chronic kidney disease: Secondary | ICD-10-CM | POA: Diagnosis not present

## 2020-08-02 DIAGNOSIS — N186 End stage renal disease: Secondary | ICD-10-CM | POA: Diagnosis not present

## 2020-08-02 DIAGNOSIS — D509 Iron deficiency anemia, unspecified: Secondary | ICD-10-CM | POA: Diagnosis not present

## 2020-08-02 DIAGNOSIS — D631 Anemia in chronic kidney disease: Secondary | ICD-10-CM | POA: Diagnosis not present

## 2020-08-02 DIAGNOSIS — N2581 Secondary hyperparathyroidism of renal origin: Secondary | ICD-10-CM | POA: Diagnosis not present

## 2020-08-02 DIAGNOSIS — Z992 Dependence on renal dialysis: Secondary | ICD-10-CM | POA: Diagnosis not present

## 2020-08-05 DIAGNOSIS — Z89512 Acquired absence of left leg below knee: Secondary | ICD-10-CM | POA: Diagnosis not present

## 2020-08-05 DIAGNOSIS — Z89511 Acquired absence of right leg below knee: Secondary | ICD-10-CM | POA: Diagnosis not present

## 2020-08-09 DIAGNOSIS — D509 Iron deficiency anemia, unspecified: Secondary | ICD-10-CM | POA: Diagnosis not present

## 2020-08-09 DIAGNOSIS — Z992 Dependence on renal dialysis: Secondary | ICD-10-CM | POA: Diagnosis not present

## 2020-08-09 DIAGNOSIS — N186 End stage renal disease: Secondary | ICD-10-CM | POA: Diagnosis not present

## 2020-08-09 DIAGNOSIS — D631 Anemia in chronic kidney disease: Secondary | ICD-10-CM | POA: Diagnosis not present

## 2020-08-09 DIAGNOSIS — N2581 Secondary hyperparathyroidism of renal origin: Secondary | ICD-10-CM | POA: Diagnosis not present

## 2020-08-13 DIAGNOSIS — Z992 Dependence on renal dialysis: Secondary | ICD-10-CM | POA: Diagnosis not present

## 2020-08-13 DIAGNOSIS — N186 End stage renal disease: Secondary | ICD-10-CM | POA: Diagnosis not present

## 2020-08-13 DIAGNOSIS — D509 Iron deficiency anemia, unspecified: Secondary | ICD-10-CM | POA: Diagnosis not present

## 2020-08-13 DIAGNOSIS — N2581 Secondary hyperparathyroidism of renal origin: Secondary | ICD-10-CM | POA: Diagnosis not present

## 2020-08-13 DIAGNOSIS — D631 Anemia in chronic kidney disease: Secondary | ICD-10-CM | POA: Diagnosis not present

## 2020-08-19 DIAGNOSIS — N2581 Secondary hyperparathyroidism of renal origin: Secondary | ICD-10-CM | POA: Diagnosis not present

## 2020-08-19 DIAGNOSIS — D631 Anemia in chronic kidney disease: Secondary | ICD-10-CM | POA: Diagnosis not present

## 2020-08-19 DIAGNOSIS — N186 End stage renal disease: Secondary | ICD-10-CM | POA: Diagnosis not present

## 2020-08-19 DIAGNOSIS — D509 Iron deficiency anemia, unspecified: Secondary | ICD-10-CM | POA: Diagnosis not present

## 2020-08-19 DIAGNOSIS — Z992 Dependence on renal dialysis: Secondary | ICD-10-CM | POA: Diagnosis not present

## 2020-08-23 DIAGNOSIS — Z992 Dependence on renal dialysis: Secondary | ICD-10-CM | POA: Diagnosis not present

## 2020-08-23 DIAGNOSIS — N2581 Secondary hyperparathyroidism of renal origin: Secondary | ICD-10-CM | POA: Diagnosis not present

## 2020-08-23 DIAGNOSIS — D509 Iron deficiency anemia, unspecified: Secondary | ICD-10-CM | POA: Diagnosis not present

## 2020-08-23 DIAGNOSIS — D631 Anemia in chronic kidney disease: Secondary | ICD-10-CM | POA: Diagnosis not present

## 2020-08-23 DIAGNOSIS — N186 End stage renal disease: Secondary | ICD-10-CM | POA: Diagnosis not present

## 2020-08-26 DIAGNOSIS — D509 Iron deficiency anemia, unspecified: Secondary | ICD-10-CM | POA: Diagnosis not present

## 2020-08-26 DIAGNOSIS — Z992 Dependence on renal dialysis: Secondary | ICD-10-CM | POA: Diagnosis not present

## 2020-08-26 DIAGNOSIS — D631 Anemia in chronic kidney disease: Secondary | ICD-10-CM | POA: Diagnosis not present

## 2020-08-26 DIAGNOSIS — N2581 Secondary hyperparathyroidism of renal origin: Secondary | ICD-10-CM | POA: Diagnosis not present

## 2020-08-26 DIAGNOSIS — N186 End stage renal disease: Secondary | ICD-10-CM | POA: Diagnosis not present

## 2020-08-30 DIAGNOSIS — D509 Iron deficiency anemia, unspecified: Secondary | ICD-10-CM | POA: Diagnosis not present

## 2020-08-30 DIAGNOSIS — D631 Anemia in chronic kidney disease: Secondary | ICD-10-CM | POA: Diagnosis not present

## 2020-08-30 DIAGNOSIS — Z992 Dependence on renal dialysis: Secondary | ICD-10-CM | POA: Diagnosis not present

## 2020-08-30 DIAGNOSIS — N186 End stage renal disease: Secondary | ICD-10-CM | POA: Diagnosis not present

## 2020-08-30 DIAGNOSIS — N2581 Secondary hyperparathyroidism of renal origin: Secondary | ICD-10-CM | POA: Diagnosis not present

## 2020-09-04 DIAGNOSIS — N186 End stage renal disease: Secondary | ICD-10-CM | POA: Diagnosis not present

## 2020-09-04 DIAGNOSIS — Z992 Dependence on renal dialysis: Secondary | ICD-10-CM | POA: Diagnosis not present

## 2020-09-04 DIAGNOSIS — N2581 Secondary hyperparathyroidism of renal origin: Secondary | ICD-10-CM | POA: Diagnosis not present

## 2020-09-04 DIAGNOSIS — D509 Iron deficiency anemia, unspecified: Secondary | ICD-10-CM | POA: Diagnosis not present

## 2020-09-04 DIAGNOSIS — D631 Anemia in chronic kidney disease: Secondary | ICD-10-CM | POA: Diagnosis not present

## 2020-09-06 DIAGNOSIS — Z992 Dependence on renal dialysis: Secondary | ICD-10-CM | POA: Diagnosis not present

## 2020-09-06 DIAGNOSIS — N2581 Secondary hyperparathyroidism of renal origin: Secondary | ICD-10-CM | POA: Diagnosis not present

## 2020-09-06 DIAGNOSIS — D509 Iron deficiency anemia, unspecified: Secondary | ICD-10-CM | POA: Diagnosis not present

## 2020-09-06 DIAGNOSIS — D631 Anemia in chronic kidney disease: Secondary | ICD-10-CM | POA: Diagnosis not present

## 2020-09-06 DIAGNOSIS — N186 End stage renal disease: Secondary | ICD-10-CM | POA: Diagnosis not present

## 2020-09-12 ENCOUNTER — Emergency Department (HOSPITAL_COMMUNITY)
Admission: EM | Admit: 2020-09-12 | Discharge: 2020-09-13 | Disposition: A | Discharge status: Home / Self Care | Payer: Medicare Other | Attending: Emergency Medicine | Admitting: Emergency Medicine

## 2020-09-12 ENCOUNTER — Other Ambulatory Visit: Payer: Self-pay

## 2020-09-12 ENCOUNTER — Emergency Department (HOSPITAL_COMMUNITY): Payer: Medicare Other

## 2020-09-12 ENCOUNTER — Encounter (HOSPITAL_COMMUNITY): Payer: Self-pay

## 2020-09-12 DIAGNOSIS — Z8679 Personal history of other diseases of the circulatory system: Secondary | ICD-10-CM | POA: Insufficient documentation

## 2020-09-12 DIAGNOSIS — Z992 Dependence on renal dialysis: Secondary | ICD-10-CM | POA: Diagnosis not present

## 2020-09-12 DIAGNOSIS — R0989 Other specified symptoms and signs involving the circulatory and respiratory systems: Secondary | ICD-10-CM

## 2020-09-12 DIAGNOSIS — Z794 Long term (current) use of insulin: Secondary | ICD-10-CM | POA: Diagnosis not present

## 2020-09-12 DIAGNOSIS — R0902 Hypoxemia: Secondary | ICD-10-CM | POA: Diagnosis not present

## 2020-09-12 DIAGNOSIS — I120 Hypertensive chronic kidney disease with stage 5 chronic kidney disease or end stage renal disease: Secondary | ICD-10-CM | POA: Diagnosis not present

## 2020-09-12 DIAGNOSIS — E1122 Type 2 diabetes mellitus with diabetic chronic kidney disease: Secondary | ICD-10-CM | POA: Diagnosis not present

## 2020-09-12 DIAGNOSIS — Z4931 Encounter for adequacy testing for hemodialysis: Secondary | ICD-10-CM | POA: Diagnosis present

## 2020-09-12 DIAGNOSIS — R531 Weakness: Secondary | ICD-10-CM | POA: Diagnosis not present

## 2020-09-12 DIAGNOSIS — N186 End stage renal disease: Secondary | ICD-10-CM

## 2020-09-12 DIAGNOSIS — Z79899 Other long term (current) drug therapy: Secondary | ICD-10-CM | POA: Insufficient documentation

## 2020-09-12 DIAGNOSIS — Z7982 Long term (current) use of aspirin: Secondary | ICD-10-CM | POA: Insufficient documentation

## 2020-09-12 DIAGNOSIS — Z20822 Contact with and (suspected) exposure to covid-19: Secondary | ICD-10-CM | POA: Diagnosis not present

## 2020-09-12 DIAGNOSIS — I517 Cardiomegaly: Secondary | ICD-10-CM | POA: Diagnosis not present

## 2020-09-12 DIAGNOSIS — E1140 Type 2 diabetes mellitus with diabetic neuropathy, unspecified: Secondary | ICD-10-CM | POA: Diagnosis not present

## 2020-09-12 DIAGNOSIS — R069 Unspecified abnormalities of breathing: Secondary | ICD-10-CM | POA: Diagnosis not present

## 2020-09-12 DIAGNOSIS — Z7902 Long term (current) use of antithrombotics/antiplatelets: Secondary | ICD-10-CM | POA: Diagnosis not present

## 2020-09-12 DIAGNOSIS — I10 Essential (primary) hypertension: Secondary | ICD-10-CM | POA: Diagnosis not present

## 2020-09-12 DIAGNOSIS — Z87891 Personal history of nicotine dependence: Secondary | ICD-10-CM | POA: Diagnosis not present

## 2020-09-12 DIAGNOSIS — I5023 Acute on chronic systolic (congestive) heart failure: Secondary | ICD-10-CM | POA: Diagnosis not present

## 2020-09-12 DIAGNOSIS — I132 Hypertensive heart and chronic kidney disease with heart failure and with stage 5 chronic kidney disease, or end stage renal disease: Secondary | ICD-10-CM | POA: Insufficient documentation

## 2020-09-12 DIAGNOSIS — J811 Chronic pulmonary edema: Secondary | ICD-10-CM | POA: Insufficient documentation

## 2020-09-12 DIAGNOSIS — R0602 Shortness of breath: Secondary | ICD-10-CM | POA: Diagnosis not present

## 2020-09-12 LAB — CBC
HCT: 35.1 % — ABNORMAL LOW (ref 36.0–46.0)
Hemoglobin: 11 g/dL — ABNORMAL LOW (ref 12.0–15.0)
MCH: 26.3 pg (ref 26.0–34.0)
MCHC: 31.3 g/dL (ref 30.0–36.0)
MCV: 84 fL (ref 80.0–100.0)
Platelets: 130 10*3/uL — ABNORMAL LOW (ref 150–400)
RBC: 4.18 MIL/uL (ref 3.87–5.11)
RDW: 19.9 % — ABNORMAL HIGH (ref 11.5–15.5)
WBC: 4.7 10*3/uL (ref 4.0–10.5)
nRBC: 0 % (ref 0.0–0.2)

## 2020-09-12 LAB — BASIC METABOLIC PANEL
Anion gap: 17 — ABNORMAL HIGH (ref 5–15)
BUN: 35 mg/dL — ABNORMAL HIGH (ref 6–20)
CO2: 25 mmol/L (ref 22–32)
Calcium: 10 mg/dL (ref 8.9–10.3)
Chloride: 96 mmol/L — ABNORMAL LOW (ref 98–111)
Creatinine, Ser: 13.09 mg/dL — ABNORMAL HIGH (ref 0.44–1.00)
GFR, Estimated: 3 mL/min — ABNORMAL LOW (ref >60)
Glucose, Bld: 100 mg/dL — ABNORMAL HIGH (ref 70–99)
Potassium: 3.9 mmol/L (ref 3.5–5.1)
Sodium: 138 mmol/L (ref 135–145)

## 2020-09-12 LAB — CBG MONITORING, ED
Glucose-Capillary: 100 mg/dL — ABNORMAL HIGH (ref 70–99)
Glucose-Capillary: 138 mg/dL — ABNORMAL HIGH (ref 70–99)

## 2020-09-12 LAB — RESP PANEL BY RT-PCR (FLU A&B, COVID) ARPGX2
Influenza A by PCR: NEGATIVE
Influenza B by PCR: NEGATIVE
SARS Coronavirus 2 by RT PCR: NEGATIVE

## 2020-09-12 MED ORDER — LIDOCAINE HCL (PF) 1 % IJ SOLN: 5.0000 mL | INTRAMUSCULAR | Status: DC | PRN

## 2020-09-12 MED ORDER — PANTOPRAZOLE SODIUM 40 MG PO TBEC
40.0000 mg | DELAYED_RELEASE_TABLET | Freq: Every day | ORAL | Status: DC
  Administered 2020-09-12 – 2020-09-13 (×2): 40 mg via ORAL
  Filled 2020-09-12 (×2): qty 1

## 2020-09-12 MED ORDER — PENTAFLUOROPROP-TETRAFLUOROETH EX AERO
1.0000 "application " | INHALATION_SPRAY | CUTANEOUS | Status: DC | PRN
  Filled 2020-09-12: qty 116

## 2020-09-12 MED ORDER — LIDOCAINE-PRILOCAINE 2.5-2.5 % EX CREA
1.0000 | TOPICAL_CREAM | CUTANEOUS | Status: DC | PRN
  Filled 2020-09-12: qty 5

## 2020-09-12 MED ORDER — SODIUM CHLORIDE 0.9 % IV SOLN: 100.0000 mL | INTRAVENOUS | Status: DC | PRN

## 2020-09-12 MED ORDER — CLOPIDOGREL BISULFATE 75 MG PO TABS
75.0000 mg | ORAL_TABLET | Freq: Every day | ORAL | Status: DC
  Administered 2020-09-13: 13:00:00 75 mg via ORAL
  Filled 2020-09-12 (×2): qty 1

## 2020-09-12 MED ORDER — CHLORHEXIDINE GLUCONATE CLOTH 2 % EX PADS: 6.0000 | MEDICATED_PAD | Freq: Every day | CUTANEOUS | Status: DC

## 2020-09-12 MED ORDER — AMLODIPINE BESYLATE 5 MG PO TABS
10.0000 mg | ORAL_TABLET | Freq: Every day | ORAL | Status: DC
  Administered 2020-09-12 – 2020-09-13 (×2): 10 mg via ORAL
  Filled 2020-09-12 (×2): qty 2

## 2020-09-12 MED ORDER — HYDRALAZINE HCL 25 MG PO TABS
25.0000 mg | ORAL_TABLET | Freq: Every day | ORAL | Status: DC
  Administered 2020-09-12 – 2020-09-13 (×2): 25 mg via ORAL
  Filled 2020-09-12 (×2): qty 1

## 2020-09-12 MED ORDER — INSULIN GLARGINE 100 UNIT/ML ~~LOC~~ SOLN
9.0000 [IU] | Freq: Every day | SUBCUTANEOUS | Status: DC
  Administered 2020-09-12: 23:00:00 9 [IU] via SUBCUTANEOUS
  Filled 2020-09-12 (×3): qty 0.09

## 2020-09-12 MED ORDER — ALTEPLASE 2 MG IJ SOLR: 2.0000 mg | Freq: Once | INTRAMUSCULAR | Status: DC | PRN

## 2020-09-12 MED ORDER — LORAZEPAM 0.5 MG PO TABS
0.5000 mg | ORAL_TABLET | Freq: Once | ORAL | Status: AC
  Administered 2020-09-12: 16:00:00 0.5 mg via ORAL
  Filled 2020-09-12: qty 1

## 2020-09-12 MED ORDER — OXYCODONE-ACETAMINOPHEN 5-325 MG PO TABS
1.0000 | ORAL_TABLET | Freq: Once | ORAL | Status: AC
  Administered 2020-09-12: 21:00:00 1 via ORAL
  Filled 2020-09-12: qty 1

## 2020-09-12 MED ORDER — HEPARIN SODIUM (PORCINE) 1000 UNIT/ML DIALYSIS
1000.0000 [IU] | INTRAMUSCULAR | Status: DC | PRN
  Filled 2020-09-12: qty 1

## 2020-09-12 NOTE — ED Triage Notes (Signed)
Pt from home with [facility], requesting dialysis, pt usually goes MWF but missed Monday and Wednesday this week due to transportation and caregiver issues. Last dialysis Friday. Pt reports sob all the time. Pt a.o, resp e.u at this time

## 2020-09-12 NOTE — ED Notes (Signed)
Pt removed monitor leads. States I don't need all that stuff"

## 2020-09-12 NOTE — Progress Notes (Signed)
[facility] Progress Note    Karen [name] is a 44 Y/O female on hemodialysis MWF at [facility]. PMH: HTN, CVA, DMT2, obesity, PAD S/P bilateral BKA, ischemic cardiomyopathy, noncompliance with HD. Last treatment 09/06/2020 stayed 2.5 hours of 4 hour treatment. She arrived to ED today asking for dialysis. Says she missed HD earlier in week D/T caregiver and transportation issues.   SCr 13.09 BUN 35 CO2 25 K+ 3.9 HGB 11.0. CXR with evidence of pulmonary vascular congestion/mild interstitial edema. She says she is "a little SOB" and anxious. She has long history of missed and shortened dialysis treatments. Real issue is that she has no transportation to dialysis tomorrow or Monday D/T [facility] transportation being closed on Holidays. Discussed with ED PA. Only real plan we can make is to hold her in ED for hemodialysis over weekend until transportation issues can be resolved. Unfortunately unable to have HD today D/T scheduling. Have ordered HD for tomorrow on regular schedule.   Subjective:  "I need a Xanax for my anxiety". Says she feels a little SOB, O2 sats 98-99 on RA.   Objective Vitals:   09/12/20 1107 09/12/20 1230  BP: (!) 209/98 (!) 155/93  Pulse: 80 81  Resp: 20 (!) 27  Temp: 98.2 F (36.8 C) 97.8 F (36.6 C)  TempSrc: Oral Oral  SpO2: 97% 98%   Physical Exam General:Chronically ill appearing female in NAD Heart: S1,S2 RRR Lungs: Bilateral breath sounds decreased in bases with few scattered bibasilar crackles. No WOB. O2 sats 97-98% on RA Abdomen: Obese, NT, no evidence of ascites Extremities: No stump edema Dialysis Access: L AVG + T/B   Additional Objective Labs: Basic Metabolic Panel: Recent Labs  Lab 09/12/20 1113  NA 138  K 3.9  CL 96*  CO2 25  GLUCOSE 100*  BUN 35*  CREATININE 13.09*  CALCIUM 10.0   Liver Function Tests: No results for input(s): AST, ALT, ALKPHOS, BILITOT, PROT, ALBUMIN in the last 168 hours. No results for  input(s): LIPASE, AMYLASE in the last 168 hours. CBC: Recent Labs  Lab 09/12/20 1113  WBC 4.7  HGB 11.0*  HCT 35.1*  MCV 84.0  PLT 130*   Blood Culture    Component Value Date/Time   SDES BLOOD LEFT ANTECUBITAL 04/08/2020 1028   SPECREQUEST  04/08/2020 1028    BOTTLES DRAWN AEROBIC ONLY Blood Culture results may not be optimal due to an inadequate volume of blood received in culture bottles   CULT  04/08/2020 1028    NO GROWTH 5 DAYS Performed at [facility], [place] [address], [place], [place] [address]    REPTSTATUS 04/13/2020 FINAL 04/08/2020 1028    Cardiac Enzymes: No results for input(s): CKTOTAL, CKMB, CKMBINDEX, TROPONINI in the last 168 hours. CBG: No results for input(s): GLUCAP in the last 168 hours. Iron Studies: No results for input(s): IRON, TIBC, TRANSFERRIN, FERRITIN in the last 72 hours. @lablastinr3 @ Studies/Results: DG Chest Portable 1 View  Result Date: 09/12/2020 CLINICAL DATA:  Shortness of breath, dialysis patient EXAM: PORTABLE CHEST 1 VIEW COMPARISON:  07/21/2020 FINDINGS: Pulmonary vascular congestion with probable mild interstitial edema. No significant pleural effusion. No pneumothorax. Stable cardiomegaly. IMPRESSION: Pulmonary vascular congestion with probable mild interstitial edema. Stable cardiomegaly. Electronically Signed   By: Macy Mis M.D.   On: 09/12/2020 12:34   Medications: . sodium chloride    . sodium chloride     . amLODipine  10 mg Oral Daily  . [START ON 09/13/2020] Chlorhexidine Gluconate  Cloth  6 each Topical [id number]  . [START ON 09/13/2020] clopidogrel  75 mg Oral Q breakfast  . hydrALAZINE  25 mg Oral Daily  . insulin glargine  9 Units Subcutaneous QHS  . pantoprazole  40 mg Oral Daily   Dialysis Orders: [facility] MWF 4 hrs 180NRe 400/800 96 kg 2.0 K/ 2.25 Ca UFP 2 L AVG -Heparin 3000 units IV TIW -Hectorol 4 mcg IV TIW -Mircera 200 mcg IV q 2 weeks (Last dose 09/04/2020)  Assessment/Plan: 1. Noncompliance  with HD: Blames transportation issues but this behavior is long standing now compounded by Holiday transportation schedule. Hold in ED over W/E so that she can have HD.  2. ESRD -MWF. HD tomorrow on schedule 3. Anemia - HGB at goal. No ESA needed.  4. Secondary hyperparathyroidism - continue binders and VDRL 5. HTN/volume - BP elevated but this is her usual BP prior to HD. Pulmonary vascular congestion on Xray, no L stump edema, trace lower edema. UF as tolerated. Resume home BP meds. 6. Nutrition - Renal/Carb mod diet, renal vit, nepro 7. DMT2-per primary  Jimmye Norman. Taivon Haroon NP-C 09/12/2020, 3:36 PM  [employer] [phone]

## 2020-09-12 NOTE — ED Notes (Signed)
Pt refuse to wear medical equipment for vitals signs pt states would like vitals to be taken as needed

## 2020-09-12 NOTE — ED Provider Notes (Signed)
[facility] EMERGENCY DEPARTMENT Provider Note   CSN: [id number] Arrival date & time: 09/12/20  1059    History Needs Dialysis  Karen [name] is a 44 y.o. female with past medical history significant for ESRD M,W,F who presents for evaluation of needing dialysis. Last dialysis 6 days ago. Has difficulty with transportation.  States Karen [name] has had worsening shortness of breath over the last 4 months.  No cough.  States her dry weight is 96 kg, Karen [name] is not sure what Karen [name] currently weighs.  Karen [name] is unsure Karen [name] has transportation to dialysis tomorrow.  Karen [name] is significantly hypertensive here in the ED.  Karen [name] is noncompliant with her medications, missed last 3 days medications.  Denies headache, lightheadedness, dizziness, chest pain, hemoptysis, abdominal pain, diarrhea, dysuria.  Does still make urine.  Last urination this morning.  Denies additional aggravating or alleviating factors.  History obtained from patient and past medical records.  No interpreter used.  [facility].  HPI     Past Medical History:  Diagnosis Date  . Anemia   . Anxiety   . Eczema   . ESRD (end stage renal disease) ([place])    [place] - TTHSAT- [place]  . Exertional shortness of breath   . GERD (gastroesophageal reflux disease)   . Headache   . History of blood transfusion   . History of kidney stones    passed  . Hypertension   . Ischemic cardiomyopathy    by echo 2014  . Neuropathy   . Pneumonia 09/2016  . Type II diabetes mellitus ([place]) 1995    Patient Active Problem List   Diagnosis Date Noted  . Right leg pain   . Palliative care by specialist   . Nausea and vomiting 02/28/2020  . Abrasion 02/28/2020  . Avulsion fracture of ankle 02/28/2020  . Drug or medicinal substance causing adverse effect in therapeutic use 02/28/2020  . Elevated brain natriuretic peptide (BNP) level 02/28/2020  . HLD (hyperlipidemia) 02/28/2020  . Hypertension, poor control 02/28/2020  .  Hypertensive emergency 02/28/2020  . Hypoxia 02/28/2020  . Mild dehydration 02/28/2020  . Sedimentation rate elevation 02/28/2020  . Strange and inexplicable behavior 29/79/8921  . COVID-19 01/19/2020  . Dysphasia status post cerebrovascular accident 11/06/2019  . Lumbar spondylosis 11/06/2019  . Anxiety and depression 06/04/2019  . Cerebral infarction due to thrombosis of unspecified precerebral artery ([place]) 06/04/2019  . Chronic pain syndrome 06/04/2019  . Neck pain on right side 06/04/2019  . Stroke due to embolism of right middle cerebral artery ([place]) 06/02/2019  . Pulmonary edema 02/20/2019  . Excoriation (skin-picking) disorder 11/22/2018  . Type 2 diabetes mellitus with hyperglycemia ([place]) 11/14/2018  . SBO (small bowel obstruction) ([place]) 07/08/2018  . Small bowel obstruction ([place]) 07/08/2018  . Hypercalcemia 02/17/2018  . Weakness generalized   . Acute on chronic systolic CHF (congestive heart failure) ([place])   . ESRD (end stage renal disease) on dialysis ([place])   . Homelessness   . Late effect of cerebrovascular accident (CVA)   . Dyspnea 01/27/2018  . Hypertension, uncontrolled 11/11/2017  . Fluid overload 11/11/2017  . Abdominal pain   . Sepsis due to methicillin resistant Staphylococcus aureus ([place]) 11/01/2017  . Penicillin allergy 10/15/2017  . Thrombocytopenia ([place]) 10/15/2017  . Personal history of transient ischemic attack (TIA), and cerebral infarction without residual deficits 09/15/2017  . Dysarthria 09/14/2017  . Lacunar infarct, acute ([place]) 09/14/2017  . Obesity 09/09/2017  . Chest pain 09/07/2017  . Other staphylococcus as  the cause of diseases classified elsewhere 08/23/2017  . Diarrhea, unspecified 12/29/2016  . Fluid overload, unspecified 10/03/2016  . Hyperkalemia 10/03/2016  . Hypertension with fluid overload 10/03/2016  . Anemia in other chronic diseases classified elsewhere 09/08/2016  . High anion gap metabolic acidosis 26/20/3559  . Right lower  lobe pneumonia 09/08/2016  . Iron deficiency anemia, unspecified 02/19/2016  . Complication of vascular dialysis catheter 02/18/2016  . Encounter for removal of sutures 02/11/2016  . Uncontrolled type 2 diabetes mellitus with chronic kidney disease on chronic dialysis, with long-term current use of insulin ([place]) 01/31/2016  . Drug noncompliance   . End-stage renal disease on hemodialysis ([place])   . Unspecified protein-calorie malnutrition ([place]) 01/24/2016  . Secondary hyperparathyroidism of renal origin ([place]) 01/22/2016  . Acquired absence of left leg below knee ([place]) 01/16/2016  . CHF (congestive heart failure) ([place]) 01/16/2016  . Coagulation defect, unspecified ([place]) 01/16/2016  . DNR (do not resuscitate) discussion 01/16/2016  . Family history of diabetes mellitus 01/16/2016  . Hypertensive chronic kidney disease with stage 1 through stage 4 chronic kidney disease, or unspecified chronic kidney disease 01/16/2016  . Nicotine dependence, other tobacco product, in remission 01/16/2016  . Pain, unspecified 01/16/2016  . Pruritus, unspecified 01/16/2016  . Red blood cell antibody positive 01/10/2016  . Hx of amputation below knee, right ([place]) 07/03/2013  . S/P BKA (below knee amputation) unilateral ([place]) 06/30/2013  . Physical deconditioning 06/30/2013  . Cardiomyopathy, ischemic - EF 45-50% with inf WMA by 2D 02/05/13 02/06/2013  . Essential hypertension 02/05/2013  . PAD (peripheral artery disease) ([place]) 02/05/2013  . Diabetic neuropathy ([place])   . Osteomyelitis of left great toe - S/P amputation 02/06/13 04/21/2011    Past Surgical History:  Procedure Laterality Date  . ABDOMINAL AORTOGRAM W/LOWER EXTREMITY Right 04/03/2020   Procedure: ABDOMINAL AORTOGRAM W/LOWER EXTREMITY;  Surgeon: Marty Heck, MD;  Location: [facility];  Service: Cardiovascular;  Laterality: Right;  . AMPUTATION Left 02/06/2013   Procedure: AMPUTATION LEFT GREAT TOE;  Surgeon: Wylene Simmer, MD;   Location: [facility];  Service: Orthopedics;  Laterality: Left;  . AMPUTATION Left 06/24/2013   Procedure: AMPUTATION BELOW KNEE ;  Surgeon: Wylene Simmer, MD;  Location: [facility];  Service: Orthopedics;  Laterality: Left;  . AMPUTATION Right 04/10/2020   Procedure: RIGHT AMPUTATION BELOW KNEE;  Surgeon: Waynetta Sandy, MD;  Location: [facility];  Service: Vascular;  Laterality: Right;  . AV FISTULA PLACEMENT Right 02/08/2017   Procedure: CREATION OF RIGHT ARM  BASILIC VEIN TO BRACHIAL ARTERY ARTERIOVENOUS (AV) FISTULA;  Surgeon: Rosetta Posner, MD;  Location: [facility];  Service: Vascular;  Laterality: Right;  . AV FISTULA PLACEMENT Left 09/10/2017   Procedure: INSERTION OF ARTERIOVENOUS (AV) GORE-TEX GRAFT  LEFT UPPER ARM;  Surgeon: Angelia Mould, MD;  Location: [facility];  Service: Vascular;  Laterality: Left;  . BASCILIC VEIN TRANSPOSITION Right 04/14/2017   Procedure: BASCILIC VEIN TRANSPOSITION-RIGHT 2ND STAGE;  Surgeon: Rosetta Posner, MD;  Location: [facility];  Service: Vascular;  Laterality: Right;  . CESAREAN SECTION  10/18/2006  . INSERTION OF DIALYSIS CATHETER    . PERIPHERAL VASCULAR BALLOON ANGIOPLASTY Right 04/03/2020   Procedure: PERIPHERAL VASCULAR BALLOON ANGIOPLASTY;  Surgeon: Marty Heck, MD;  Location: [facility];  Service: Cardiovascular;  Laterality: Right;  SFA  . WOUND DEBRIDEMENT Right 04/04/2020   Procedure: RIGHT FOOT DEBRIDEMENT;  Surgeon: Waynetta Sandy, MD;  Location: [facility];  Service: Vascular;  Laterality: Right;  OB History   No obstetric history on file.     Family History  Problem Relation Age of Onset  . Hypertension Mother   . Diabetes Mother   . Diabetes Father     Social History   Tobacco Use  . Smoking status: Former Smoker    Packs/day: 3.00    Years: 10.00    Pack years: 30.00    Types: Cigarettes    Quit date: 09/21/2004    Years since quitting: 15.9  . Smokeless tobacco: Never Used  Vaping Use  . Vaping Use: Never  used  Substance Use Topics  . Alcohol use: No  . Drug use: No    Home Medications Prior to Admission medications   Medication Sig Start Date End Date Taking? Authorizing Provider  amLODipine (NORVASC) 10 MG tablet Take 10 mg by mouth daily.   Yes [provider]  aspirin EC 81 MG tablet Take 81 mg by mouth daily. Swallow whole.   Yes [provider]  atorvastatin (LIPITOR) 40 MG tablet Take 40 mg by mouth daily.  06/08/19  Yes [provider]  calcitRIOL (ROCALTROL) 0.5 MCG capsule Take 3 capsules (1.5 mcg total) by mouth Every Tuesday,Thursday,and Saturday with dialysis. Patient taking differently: Take 1.5 mcg by mouth every Monday, Wednesday, and Friday. 11/13/17  Yes Debbe Odea, MD  calcium carbonate (OS-CAL) 1250 (500 Ca) MG chewable tablet Chew 1 tablet by mouth 3 (three) times daily.  01/14/16  Yes [provider]  cinacalcet (SENSIPAR) 30 MG tablet Take 1 tablet (30 mg total) by mouth Every Tuesday,Thursday,and Saturday with dialysis. Patient taking differently: Take 30 mg by mouth every Monday, Wednesday, and Friday with hemodialysis. 11/13/17  Yes Debbe Odea, MD  clopidogrel (PLAVIX) 75 MG tablet Take 1 tablet (75 mg total) by mouth daily with breakfast. 04/17/20  Yes Rhyne, Samantha J, PA-C  esomeprazole (NEXIUM) 40 MG capsule Take 40 mg by mouth daily. 06/29/11  Yes [provider]  hydrALAZINE (APRESOLINE) 25 MG tablet Take 25 mg by mouth daily. 06/08/19  Yes [provider]  insulin glargine (LANTUS) 100 UNIT/ML injection Inject 9 Units into the skin at bedtime. 01/14/16  Yes [provider]  lidocaine-prilocaine (EMLA) cream Apply 1 application topically daily as needed (pain). 05/02/19  Yes [provider]  naloxone (NARCAN) 4 MG/0.1ML LIQD nasal spray kit Place 0.4 mg into the nose once. Repeat after 3 minutes if no or minimal response 01/18/20  Yes [provider]  Darbepoetin Alfa (ARANESP) 200  MCG/0.4ML SOSY injection Inject 0.4 mLs (200 mcg total) into the vein every Thursday with hemodialysis. Patient not taking: Reported on 09/12/2020 02/03/18   Bonnita Hollow, MD  ferric gluconate 62.5 mg in sodium chloride 0.9 % 100 mL Inject 62.5 mg into the vein every Thursday with hemodialysis. Patient not taking: Reported on 09/12/2020 02/03/18   Bonnita Hollow, MD  metoprolol succinate (TOPROL-XL) 25 MG 24 hr tablet Take 0.5 tablets (12.5 mg total) by mouth daily. Patient not taking: Reported on 09/12/2020 07/10/18 02/20/28  Bonnielee Haff, MD  multivitamin (RENA-VIT) TABS tablet Take 1 tablet by mouth at bedtime. Patient not taking: Reported on 09/12/2020 11/13/17   Debbe Odea, MD  NOVOFINE 32G X 6 MM MISC  02/01/20   [provider]  oxyCODONE (OXY IR/ROXICODONE) 5 MG immediate release tablet Take 1 tablet (5 mg total) by mouth every 4 (four) hours as needed for moderate pain or severe pain. Patient not taking: No sig reported 04/17/20  Rhyne, Samantha J, PA-C  pantoprazole (PROTONIX) 20 MG tablet TAKE 1 TABLET(20 MG) BY MOUTH EVERY DAY Patient not taking: Reported on 09/12/2020 02/06/20   Lillard Anes, MD  polyethylene glycol [facility] Floria Raveling) packet Take 17 g by mouth daily. Patient not taking: Reported on 09/12/2020 02/02/18   Bonnita Hollow, MD    Allergies    Baclofen, Penicillins, Morphine and related, and Novolog [insulin aspart]  Review of Systems   Review of Systems  Constitutional: Negative.   HENT: Negative.   Respiratory: Positive for shortness of breath (4 months).   Cardiovascular: Negative.   Gastrointestinal: Negative.   Genitourinary: Negative.   Musculoskeletal: Negative.   Skin: Negative.   Neurological: Negative.   All other systems reviewed and are negative.   Physical Exam Updated Vital Signs BP (!) 168/106   Pulse 88   Temp 97.8 F (36.6 C) (Oral)   Resp 18   SpO2 96%   Physical Exam Vitals and nursing note reviewed.   Constitutional:      General: Karen [name] is not in acute distress.    Appearance: Karen [name] is well-developed and well-nourished. Karen [name] is ill-appearing (Chronically ill appearing). Karen [name] is not toxic-appearing.  HENT:     Head: Normocephalic and atraumatic.     Nose: Nose normal.     Mouth/Throat:     Mouth: Mucous membranes are moist.  Eyes:     Pupils: Pupils are equal, round, and reactive to light.  Cardiovascular:     Rate and Rhythm: Normal rate.     Pulses: Normal pulses and intact distal pulses.     Heart sounds: Normal heart sounds.  Pulmonary:     Effort: No respiratory distress.     Comments: Speaks in sentences without difficulty. Mild rales in bases. Abdominal:     General: Bowel sounds are normal. There is no distension.     Palpations: There is no mass.     Tenderness: There is no abdominal tenderness. There is no right CVA tenderness, left CVA tenderness, guarding or rebound.     Hernia: No hernia is present.  Musculoskeletal:        General: Normal range of motion.     Cervical back: Normal range of motion.     Comments: Moves all 4 extremities without difficulty. Bilateral BKA. Non bony tenderness.  Skin:    General: Skin is warm and dry.     Capillary Refill: Capillary refill takes 2 to 3 seconds.  Neurological:     General: No focal deficit present.     Mental Status: Karen [name] is alert and oriented to person, place, and time.     Comments: Karen [name] grossly intact. No facial droop.  Psychiatric:        Mood and Affect: Mood and affect normal.     ED Results / Procedures / Treatments   Labs (all labs ordered are listed, but only abnormal results are displayed) Labs Reviewed  BASIC METABOLIC PANEL - Abnormal; Notable for the following components:      Result Value   Chloride 96 (*)    Glucose, Bld 100 (*)    BUN 35 (*)    Creatinine, Ser 13.09 (*)    GFR, Estimated 3 (*)    Anion gap 17 (*)    All other components within normal limits  CBC - Abnormal; Notable for the  following components:   Hemoglobin 11.0 (*)    HCT 35.1 (*)    RDW 19.9 (*)  Platelets 130 (*)    All other components within normal limits  CBG MONITORING, ED - Abnormal; Notable for the following components:   Glucose-Capillary 100 (*)    All other components within normal limits  RESP PANEL BY RT-PCR (FLU A&B, COVID) ARPGX2  I-STAT BETA HCG BLOOD, ED (MC, WL, AP ONLY)    EKG EKG Interpretation  Date/Time:  Thursday September 12 2020 10:59:32 EST Ventricular Rate:  80 PR Interval:  178 QRS Duration: 96 QT Interval:  410 QTC Calculation: 472 R Axis:   25 Text Interpretation: Normal sinus rhythm Possible Inferior infarct , age undetermined Anterior infarct , age undetermined Abnormal ECG No significant change since last tracing Confirmed by Isla Pence [phone]) on 09/12/2020 12:41:58 PM   Radiology DG Chest Portable 1 View  Result Date: 09/12/2020 CLINICAL DATA:  Shortness of breath, dialysis patient EXAM: PORTABLE CHEST 1 VIEW COMPARISON:  07/21/2020 FINDINGS: Pulmonary vascular congestion with probable mild interstitial edema. No significant pleural effusion. No pneumothorax. Stable cardiomegaly. IMPRESSION: Pulmonary vascular congestion with probable mild interstitial edema. Stable cardiomegaly. Electronically Signed   By: Macy Mis M.D.   On: 09/12/2020 12:34    Procedures Procedures (including critical care time)  Medications Ordered in ED Medications  Chlorhexidine Gluconate Cloth 2 % PADS 6 each (has no administration in time range)  pentafluoroprop-tetrafluoroeth (GEBAUERS) aerosol 1 application (has no administration in time range)  lidocaine (PF) (XYLOCAINE) 1 % injection 5 mL (has no administration in time range)  lidocaine-prilocaine (EMLA) cream 1 application (has no administration in time range)  0.9 %  sodium chloride infusion (has no administration in time range)  0.9 %  sodium chloride infusion (has no administration in time range)  heparin  injection 1,000 Units (has no administration in time range)  alteplase (CATHFLO ACTIVASE) injection 2 mg (has no administration in time range)  insulin glargine (LANTUS) injection 9 Units (has no administration in time range)  hydrALAZINE (APRESOLINE) tablet 25 mg (25 mg Oral Given 09/12/20 1537)  pantoprazole (PROTONIX) EC tablet 40 mg (40 mg Oral Given 09/12/20 1538)  clopidogrel (PLAVIX) tablet 75 mg (has no administration in time range)  amLODipine (NORVASC) tablet 10 mg (10 mg Oral Given 09/12/20 1537)  LORazepam (ATIVAN) tablet 0.5 mg (0.5 mg Oral Given 09/12/20 1540)    ED Course  I have reviewed the triage vital signs and the nursing notes.  Pertinent labs & imaging results that were available during my care of the patient were reviewed by me and considered in my medical decision making (see chart for details).  44 year old, chronically ill patient on dialysis, new onset Friday presents for evaluation of needing dialysis.  Karen [name] is afebrile, nonseptic, chronically -ill-appearing.  Has had ongoing shortness of breath over the last 4 months.  Not as really worse today.  Associated cough however patient states chronic.  Has missed 1 week of dialysis due to transportation issues.  Lives at home and has home health coming out.  Difficulty with mobility due to bilateral BKA. Does have some mild tachypnea however no hypoxia, tachycardia.   Labs obtained from triage which I personally reviewed and interpreted:  EKG without ischemia Chest x-ray with cardiomegaly and pulmonary congestion likely due to missed dialysis CBC without leukocytosis Metabolic panel creatinine 13.09, Gap 17, normal Bicarb, BUN at baseline COVID negative  Patient symptoms likely due to missed dialysis. Will consult with nephrology. I do not feel patient needs inpatient admission at this time. Would suspect if shortness of breath improved after dialysis  patient can be likely discharged after dialysis. Low suspicion for CHF  exacerbation such as flash pulmonary edema, PE, dissection, ACS, bacterial infectious process as cause of her shortness of breath.  I have placed consult with transition of care given her lack of transportation for dialysis tomorrow as well as Monday to see if this can be arranged. CM discussed with patient. Unable to find transportation for Dialysis over W/E.  Discussed plan with patient. Karen [name] is agreeable to this. States Karen [name] does feel anxious. States Karen [name] intermittently takes Ativan at home for her anxiety. Small dose ordered here in ED.  CONSULT with Dr. Hollie Salk with Nephrology. Patient to be dialyzed with likely dc home after dialysis.  CONSULT with Juanell Fairly, NP with Nephrology who has assessed patient. Unfortunately patient will not be able to get transportation for dialysis over the holiday. States patient will get dialyzed tomorrow morning here in ED and again here on Monday and then can likely be dc home after Dialysis on Monday.  Patient does not meet inpatient criteria, discussed with attending.  Will board here in ED. Likely can be DC home after Monday's dialysis.  Home med orders and standing orders placed.  Patient seen by by attending, Dr. Gilford Raid who agrees above treatment, plan and disposition.      MDM Rules/Calculators/A&P                          Jimeka Balan Leer was evaluated in Emergency Department on 09/12/2020 for the symptoms described in the history of present illness. Karen [name] was evaluated in the context of the global COVID-19 pandemic, which necessitated consideration that the patient might be at risk for infection with the SARS-CoV-2 virus that causes COVID-19. Institutional protocols and algorithms that pertain to the evaluation of patients at risk for COVID-19 are in a state of rapid change based on information released by regulatory bodies including the CDC and federal and state organizations. These policies and algorithms were followed during the patient's care in the  ED. Final Clinical Impression(s) / ED Diagnoses Final diagnoses:  Pulmonary congestion  ESRD (end stage renal disease) on dialysis ([place])  Hypertension, unspecified type    Rx / DC Orders ED Discharge Orders    None       Aviraj Kentner A, PA-C 09/12/20 1810    Isla Pence, MD 09/13/20 [phone]

## 2020-09-12 NOTE — ED Notes (Signed)
Pt continues to remove her BP cuff

## 2020-09-12 NOTE — Social Work (Signed)
CSW called [facility] in [place] in an attempt to speak with Renal Education officer, museum.  Left HIPAA compliant voicemail requesting call back.  CSW Met with Pt at bedside. Pt states that she was unable to find assistance getting prepared for her appointment at dialysis on Monday and Wednesday and therefore was unable to get transportation to center. CSW discussed options for possible placement in a nursing facility to assist with ADLs, but Pt states that she is too young and does not wish to explore such avenues.

## 2020-09-12 NOTE — ED Notes (Addendum)
NT and RN hooked pt up to monitor, pt continues to pull everything off. RN was able to get updated BP and O2. Pt wont keep cords on and continues to pull them off stating "I'm irritated".

## 2020-09-13 DIAGNOSIS — I10 Essential (primary) hypertension: Secondary | ICD-10-CM | POA: Diagnosis not present

## 2020-09-13 DIAGNOSIS — R5381 Other malaise: Secondary | ICD-10-CM | POA: Diagnosis not present

## 2020-09-13 DIAGNOSIS — I132 Hypertensive heart and chronic kidney disease with heart failure and with stage 5 chronic kidney disease, or end stage renal disease: Secondary | ICD-10-CM | POA: Diagnosis not present

## 2020-09-13 DIAGNOSIS — M2550 Pain in unspecified joint: Secondary | ICD-10-CM | POA: Diagnosis not present

## 2020-09-13 DIAGNOSIS — Z7401 Bed confinement status: Secondary | ICD-10-CM | POA: Diagnosis not present

## 2020-09-13 LAB — CBC
HCT: 33.7 % — ABNORMAL LOW (ref 36.0–46.0)
Hemoglobin: 10.5 g/dL — ABNORMAL LOW (ref 12.0–15.0)
MCH: 25.7 pg — ABNORMAL LOW (ref 26.0–34.0)
MCHC: 31.2 g/dL (ref 30.0–36.0)
MCV: 82.6 fL (ref 80.0–100.0)
Platelets: 127 10*3/uL — ABNORMAL LOW (ref 150–400)
RBC: 4.08 MIL/uL (ref 3.87–5.11)
RDW: 19.4 % — ABNORMAL HIGH (ref 11.5–15.5)
WBC: 4.3 10*3/uL (ref 4.0–10.5)
nRBC: 0 % (ref 0.0–0.2)

## 2020-09-13 LAB — RENAL FUNCTION PANEL
Albumin: 3.4 g/dL — ABNORMAL LOW (ref 3.5–5.0)
Anion gap: 17 — ABNORMAL HIGH (ref 5–15)
BUN: 38 mg/dL — ABNORMAL HIGH (ref 6–20)
CO2: 24 mmol/L (ref 22–32)
Calcium: 9.3 mg/dL (ref 8.9–10.3)
Chloride: 96 mmol/L — ABNORMAL LOW (ref 98–111)
Creatinine, Ser: 13.5 mg/dL — ABNORMAL HIGH (ref 0.44–1.00)
GFR, Estimated: 3 mL/min — ABNORMAL LOW (ref >60)
Glucose, Bld: 79 mg/dL (ref 70–99)
Phosphorus: 7.6 mg/dL — ABNORMAL HIGH (ref 2.5–4.6)
Potassium: 3.9 mmol/L (ref 3.5–5.1)
Sodium: 137 mmol/L (ref 135–145)

## 2020-09-13 LAB — CBG MONITORING, ED: Glucose-Capillary: 87 mg/dL (ref 70–99)

## 2020-09-13 MED ORDER — LORAZEPAM 1 MG PO TABS
0.5000 mg | ORAL_TABLET | Freq: Once | ORAL | Status: AC
  Administered 2020-09-13: 13:00:00 0.5 mg via ORAL
  Filled 2020-09-13: qty 1

## 2020-09-13 NOTE — Progress Notes (Signed)
   09/13/20 1612  TOC ED Mini Assessment  TOC Time spent with patient (minutes): 30  PING Used in TOC Assessment No  Admission or Readmission Diverted No  What brought you to the Emergency Department?  Patient came to ED b/c HD and transportation was on holiday schedule  Barrier interventions ED RNCM met with patient at bedside to discuss assistance with transportion to HD for Monday. Patient reports [facility] transport is closed on Monday so patient would have no way to get there.  Discussed [facility] who patient states have taken in the past. CM contacted [facility] who states they are familiar with this patient and will make every attempt to get her to HD in [place] for 9am chair time. CM Updated [facility] as well. MD updated.

## 2020-09-13 NOTE — ED Notes (Signed)
Pt repositioned in bed. Given a roast beef sandwich, 2 packs of cheese, and 1 carton of non fat milk.

## 2020-09-13 NOTE — ED Notes (Signed)
Pt refused CBG.

## 2020-09-13 NOTE — ED Notes (Signed)
[facility]- Dialysis Fri&Mon Breakfast is ordered

## 2020-09-13 NOTE — Progress Notes (Signed)
Discussed unsafe living arrangements with patient today. She was seen by MSW 09/12/2020 and refused SNF placement. Now willing to go to SNF until transportation and home care issues resolved. Her caveat is that she not be sent back to SNF she went to previously or be sent to [facility]. Contacted MSW and they are aware.    Juanell Fairly [facility] [phone]

## 2020-09-13 NOTE — Procedures (Signed)
Patient seen on Hemodialysis. BP (!) 191/68   Pulse 82   Temp 97.8 F (36.6 C) (Oral)   Resp 16   SpO2 100%   QB 400, UF goal 3L Tolerating treatment without complaints at this time.  Elmarie Shiley MD [facility]. Office # [phone] Pager # [phone] 9:03 AM

## 2020-09-13 NOTE — ED Notes (Signed)
Patient repositioned

## 2020-09-13 NOTE — Care Management (Signed)
Patient will be transported home by [facility], called awaiting pick up ED RN updated

## 2020-09-13 NOTE — ED Notes (Signed)
Pt repositioned

## 2020-09-13 NOTE — ED Provider Notes (Signed)
Pt held overnight for dialysis.  Pt has received dialysis this morning.  Ready for discharge at this time.   Dorie Rank, MD 09/13/20 1328

## 2020-09-13 NOTE — ED Notes (Signed)
Discharge instructions provided to patient. Verbalized understanding. Alert and oriented. PTAR at bedside to transport patient home.

## 2020-09-13 NOTE — ED Notes (Signed)
PT transported to dialysis.

## 2020-09-13 NOTE — Discharge Instructions (Signed)
Follow up as instructed on Monday for dialysis.  Make sure to take all of your medications

## 2020-09-16 DIAGNOSIS — R0902 Hypoxemia: Secondary | ICD-10-CM | POA: Diagnosis not present

## 2020-09-16 DIAGNOSIS — Z743 Need for continuous supervision: Secondary | ICD-10-CM | POA: Diagnosis not present

## 2020-09-16 DIAGNOSIS — N2581 Secondary hyperparathyroidism of renal origin: Secondary | ICD-10-CM | POA: Diagnosis not present

## 2020-09-16 DIAGNOSIS — D509 Iron deficiency anemia, unspecified: Secondary | ICD-10-CM | POA: Diagnosis not present

## 2020-09-16 DIAGNOSIS — N186 End stage renal disease: Secondary | ICD-10-CM | POA: Diagnosis not present

## 2020-09-16 DIAGNOSIS — D631 Anemia in chronic kidney disease: Secondary | ICD-10-CM | POA: Diagnosis not present

## 2020-09-16 DIAGNOSIS — Z992 Dependence on renal dialysis: Secondary | ICD-10-CM | POA: Diagnosis not present

## 2020-09-16 DIAGNOSIS — R279 Unspecified lack of coordination: Secondary | ICD-10-CM | POA: Diagnosis not present

## 2020-09-16 DIAGNOSIS — I10 Essential (primary) hypertension: Secondary | ICD-10-CM | POA: Diagnosis not present

## 2020-09-18 DIAGNOSIS — D631 Anemia in chronic kidney disease: Secondary | ICD-10-CM | POA: Diagnosis not present

## 2020-09-18 DIAGNOSIS — N186 End stage renal disease: Secondary | ICD-10-CM | POA: Diagnosis not present

## 2020-09-18 DIAGNOSIS — D509 Iron deficiency anemia, unspecified: Secondary | ICD-10-CM | POA: Diagnosis not present

## 2020-09-18 DIAGNOSIS — Z992 Dependence on renal dialysis: Secondary | ICD-10-CM | POA: Diagnosis not present

## 2020-09-18 DIAGNOSIS — N2581 Secondary hyperparathyroidism of renal origin: Secondary | ICD-10-CM | POA: Diagnosis not present

## 2020-10-02 ENCOUNTER — Emergency Department (HOSPITAL_COMMUNITY): Payer: Medicare Other

## 2020-10-02 ENCOUNTER — Other Ambulatory Visit: Payer: Self-pay

## 2020-10-02 ENCOUNTER — Encounter (HOSPITAL_COMMUNITY): Payer: Self-pay | Admitting: Emergency Medicine

## 2020-10-02 ENCOUNTER — Emergency Department (HOSPITAL_COMMUNITY)
Admission: EM | Admit: 2020-10-02 | Discharge: 2020-10-03 | Disposition: A | Discharge status: Home / Self Care | Payer: Medicare Other | Attending: Emergency Medicine | Admitting: Emergency Medicine

## 2020-10-02 DIAGNOSIS — Z89519 Acquired absence of unspecified leg below knee: Secondary | ICD-10-CM | POA: Insufficient documentation

## 2020-10-02 DIAGNOSIS — Z992 Dependence on renal dialysis: Secondary | ICD-10-CM | POA: Insufficient documentation

## 2020-10-02 DIAGNOSIS — I509 Heart failure, unspecified: Secondary | ICD-10-CM | POA: Insufficient documentation

## 2020-10-02 DIAGNOSIS — N186 End stage renal disease: Secondary | ICD-10-CM | POA: Diagnosis not present

## 2020-10-02 DIAGNOSIS — Z89412 Acquired absence of left great toe: Secondary | ICD-10-CM | POA: Insufficient documentation

## 2020-10-02 DIAGNOSIS — E1122 Type 2 diabetes mellitus with diabetic chronic kidney disease: Secondary | ICD-10-CM | POA: Insufficient documentation

## 2020-10-02 DIAGNOSIS — Z20822 Contact with and (suspected) exposure to covid-19: Secondary | ICD-10-CM | POA: Insufficient documentation

## 2020-10-02 DIAGNOSIS — Z87891 Personal history of nicotine dependence: Secondary | ICD-10-CM | POA: Insufficient documentation

## 2020-10-02 DIAGNOSIS — Z79899 Other long term (current) drug therapy: Secondary | ICD-10-CM | POA: Insufficient documentation

## 2020-10-02 DIAGNOSIS — N2581 Secondary hyperparathyroidism of renal origin: Secondary | ICD-10-CM | POA: Insufficient documentation

## 2020-10-02 DIAGNOSIS — Z794 Long term (current) use of insulin: Secondary | ICD-10-CM | POA: Insufficient documentation

## 2020-10-02 DIAGNOSIS — Z7982 Long term (current) use of aspirin: Secondary | ICD-10-CM | POA: Diagnosis not present

## 2020-10-02 DIAGNOSIS — I132 Hypertensive heart and chronic kidney disease with heart failure and with stage 5 chronic kidney disease, or end stage renal disease: Secondary | ICD-10-CM | POA: Diagnosis not present

## 2020-10-02 DIAGNOSIS — R0600 Dyspnea, unspecified: Secondary | ICD-10-CM | POA: Diagnosis present

## 2020-10-02 LAB — BASIC METABOLIC PANEL
Anion gap: 16 — ABNORMAL HIGH (ref 5–15)
BUN: 47 mg/dL — ABNORMAL HIGH (ref 6–20)
CO2: 26 mmol/L (ref 22–32)
Calcium: 9.2 mg/dL (ref 8.9–10.3)
Chloride: 98 mmol/L (ref 98–111)
Creatinine, Ser: 11.03 mg/dL — ABNORMAL HIGH (ref 0.44–1.00)
GFR, Estimated: 4 mL/min — ABNORMAL LOW (ref >60)
Glucose, Bld: 134 mg/dL — ABNORMAL HIGH (ref 70–99)
Potassium: 5.3 mmol/L — ABNORMAL HIGH (ref 3.5–5.1)
Sodium: 140 mmol/L (ref 135–145)

## 2020-10-02 LAB — I-STAT BETA HCG BLOOD, ED (MC, WL, AP ONLY): I-stat hCG, quantitative: <5 m[IU]/mL (ref <5)

## 2020-10-02 LAB — CBC
HCT: 36 % (ref 36.0–46.0)
Hemoglobin: 10.6 g/dL — ABNORMAL LOW (ref 12.0–15.0)
MCH: 25.2 pg — ABNORMAL LOW (ref 26.0–34.0)
MCHC: 29.4 g/dL — ABNORMAL LOW (ref 30.0–36.0)
MCV: 85.5 fL (ref 80.0–100.0)
Platelets: 98 10*3/uL — ABNORMAL LOW (ref 150–400)
RBC: 4.21 MIL/uL (ref 3.87–5.11)
RDW: 20.5 % — ABNORMAL HIGH (ref 11.5–15.5)
WBC: 5.9 10*3/uL (ref 4.0–10.5)
nRBC: 0 % (ref 0.0–0.2)

## 2020-10-02 MED ORDER — HYDRALAZINE HCL 25 MG PO TABS
25.0000 mg | ORAL_TABLET | Freq: Once | ORAL | Status: AC
  Administered 2020-10-03: 25 mg via ORAL
  Filled 2020-10-02: qty 1

## 2020-10-02 MED ORDER — OXYCODONE HCL 5 MG PO TABS
5.0000 mg | ORAL_TABLET | Freq: Once | ORAL | Status: AC
Start: 2020-10-03 — End: 2020-10-03
  Administered 2020-10-03: 5 mg via ORAL
  Filled 2020-10-02: qty 1

## 2020-10-02 MED ORDER — CLONIDINE HCL 0.1 MG PO TABS
0.1000 mg | ORAL_TABLET | Freq: Once | ORAL | Status: AC
  Administered 2020-10-03: 0.1 mg via ORAL
  Filled 2020-10-02: qty 1

## 2020-10-02 MED ORDER — CHLORHEXIDINE GLUCONATE CLOTH 2 % EX PADS: 6.0000 | MEDICATED_PAD | Freq: Every day | CUTANEOUS | Status: DC

## 2020-10-02 MED ORDER — SODIUM ZIRCONIUM CYCLOSILICATE 10 G PO PACK
10.0000 g | PACK | Freq: Once | ORAL | Status: AC
  Administered 2020-10-03: 10 g via ORAL
  Filled 2020-10-02: qty 1

## 2020-10-02 NOTE — ED Provider Notes (Incomplete)
She has a history of hypertension, diabetes, hyperlipidemia, end-stage renal disease on hemodialysis, systolic heart failure and comes in because of shortness of breath.  She had last been dialyzed 3 days ago and missed a scheduled dialysis and is scheduled for dialysis tomorrow.  On exam, she is mildly dyspneic at rest.  Rales are present at the right base.  She is status post bilateral BKA.  Chest x-ray does show some pulmonary vascular congestion.  Oxygen saturation does intermittently drop down into the 80s.  Will discuss with nephrology regarding urgent dialysis in the hospital.

## 2020-10-02 NOTE — ED Triage Notes (Signed)
Pt BIB [facility] from home, c/o shortness of breath. Dialysis pt MWF, last dialysis on Sunday at [facility], states she hasn't been dialyzed since.

## 2020-10-02 NOTE — ED Provider Notes (Signed)
[facility] EMERGENCY DEPARTMENT Provider Note   CSN: [id number] Arrival date & time: 10/02/20  2130     History Chief Complaint  Patient presents with  . Shortness of Breath    Karen [name] is a 45 y.o. female with a hx of ESRD on dialysis MWF, T2DM, hypertension, PAD, CHF, & chronic pain syndrome who presents to the ED due to dyspnea and needing dialysis. She states she was last fully dialyzed 09/29/20, missed dialysis today due to having a doctor's appointment. States she has had progressively worsening dyspnea, has trouble speaking, feels generally fatigued. No alleviating/aggravting factors. She is suppose to get dialysis tomorrow potentially but feels she cannot wait. Denies fever, chills, chest pain, abdominal pain, or N/V. She states she has been taking all of her medicines as prescribed.    HPI     Past Medical History:  Diagnosis Date  . Anemia   . Anxiety   . Eczema   . ESRD (end stage renal disease) ([place])    [place] - [facility]- [place]  . Exertional shortness of breath   . GERD (gastroesophageal reflux disease)   . Headache   . History of blood transfusion   . History of kidney stones    passed  . Hypertension   . Ischemic cardiomyopathy    by echo 2014  . Neuropathy   . Pneumonia 09/2016  . Type II diabetes mellitus ([place]) 1995    Patient Active Problem List   Diagnosis Date Noted  . Right leg pain   . Palliative care by specialist   . Nausea and vomiting 02/28/2020  . Abrasion 02/28/2020  . Avulsion fracture of ankle 02/28/2020  . Drug or medicinal substance causing adverse effect in therapeutic use 02/28/2020  . Elevated brain natriuretic peptide (BNP) level 02/28/2020  . HLD (hyperlipidemia) 02/28/2020  . Hypertension, poor control 02/28/2020  . Hypertensive emergency 02/28/2020  . Hypoxia 02/28/2020  . Mild dehydration 02/28/2020  . Sedimentation rate elevation 02/28/2020  . Strange and inexplicable behavior 24/23/5361  .  COVID-19 01/19/2020  . Dysphasia status post cerebrovascular accident 11/06/2019  . Lumbar spondylosis 11/06/2019  . Anxiety and depression 06/04/2019  . Cerebral infarction due to thrombosis of unspecified precerebral artery ([place]) 06/04/2019  . Chronic pain syndrome 06/04/2019  . Neck pain on right side 06/04/2019  . Stroke due to embolism of right middle cerebral artery ([place]) 06/02/2019  . Pulmonary edema 02/20/2019  . Excoriation (skin-picking) disorder 11/22/2018  . Type 2 diabetes mellitus with hyperglycemia ([place]) 11/14/2018  . SBO (small bowel obstruction) ([place]) 07/08/2018  . Small bowel obstruction ([place]) 07/08/2018  . Hypercalcemia 02/17/2018  . Weakness generalized   . Acute on chronic systolic CHF (congestive heart failure) ([place])   . ESRD (end stage renal disease) on dialysis ([place])   . Homelessness   . Late effect of cerebrovascular accident (CVA)   . Dyspnea 01/27/2018  . Hypertension, uncontrolled 11/11/2017  . Fluid overload 11/11/2017  . Abdominal pain   . Sepsis due to methicillin resistant Staphylococcus aureus ([place]) 11/01/2017  . Penicillin allergy 10/15/2017  . Thrombocytopenia ([place]) 10/15/2017  . Personal history of transient ischemic attack (TIA), and cerebral infarction without residual deficits 09/15/2017  . Dysarthria 09/14/2017  . Lacunar infarct, acute ([place]) 09/14/2017  . Obesity 09/09/2017  . Chest pain 09/07/2017  . Other staphylococcus as the cause of diseases classified elsewhere 08/23/2017  . Diarrhea, unspecified 12/29/2016  . Fluid overload, unspecified 10/03/2016  . Hyperkalemia 10/03/2016  . Hypertension with  fluid overload 10/03/2016  . Anemia in other chronic diseases classified elsewhere 09/08/2016  . High anion gap metabolic acidosis 66/02/44  . Right lower lobe pneumonia 09/08/2016  . Iron deficiency anemia, unspecified 02/19/2016  . Complication of vascular dialysis catheter 02/18/2016  . Encounter for removal of sutures 02/11/2016  .  Uncontrolled type 2 diabetes mellitus with chronic kidney disease on chronic dialysis, with long-term current use of insulin ([place]) 01/31/2016  . Drug noncompliance   . End-stage renal disease on hemodialysis ([place])   . Unspecified protein-calorie malnutrition ([place]) 01/24/2016  . Secondary hyperparathyroidism of renal origin ([place]) 01/22/2016  . Acquired absence of left leg below knee ([place]) 01/16/2016  . CHF (congestive heart failure) ([place]) 01/16/2016  . Coagulation defect, unspecified ([place]) 01/16/2016  . DNR (do not resuscitate) discussion 01/16/2016  . Family history of diabetes mellitus 01/16/2016  . Hypertensive chronic kidney disease with stage 1 through stage 4 chronic kidney disease, or unspecified chronic kidney disease 01/16/2016  . Nicotine dependence, other tobacco product, in remission 01/16/2016  . Pain, unspecified 01/16/2016  . Pruritus, unspecified 01/16/2016  . Red blood cell antibody positive 01/10/2016  . Hx of amputation below knee, right ([place]) 07/03/2013  . S/P BKA (below knee amputation) unilateral ([place]) 06/30/2013  . Physical deconditioning 06/30/2013  . Cardiomyopathy, ischemic - EF 45-50% with inf WMA by 2D 02/05/13 02/06/2013  . Essential hypertension 02/05/2013  . PAD (peripheral artery disease) ([place]) 02/05/2013  . Diabetic neuropathy ([place])   . Osteomyelitis of left great toe - S/P amputation 02/06/13 04/21/2011    Past Surgical History:  Procedure Laterality Date  . ABDOMINAL AORTOGRAM W/LOWER EXTREMITY Right 04/03/2020   Procedure: ABDOMINAL AORTOGRAM W/LOWER EXTREMITY;  Surgeon: Marty Heck, MD;  Location: [facility];  Service: Cardiovascular;  Laterality: Right;  . AMPUTATION Left 02/06/2013   Procedure: AMPUTATION LEFT GREAT TOE;  Surgeon: Wylene Simmer, MD;  Location: [facility];  Service: Orthopedics;  Laterality: Left;  . AMPUTATION Left 06/24/2013   Procedure: AMPUTATION BELOW KNEE ;  Surgeon: Wylene Simmer, MD;  Location: [facility];  Service:  Orthopedics;  Laterality: Left;  . AMPUTATION Right 04/10/2020   Procedure: RIGHT AMPUTATION BELOW KNEE;  Surgeon: Waynetta Sandy, MD;  Location: [facility];  Service: Vascular;  Laterality: Right;  . AV FISTULA PLACEMENT Right 02/08/2017   Procedure: CREATION OF RIGHT ARM  BASILIC VEIN TO BRACHIAL ARTERY ARTERIOVENOUS (AV) FISTULA;  Surgeon: Rosetta Posner, MD;  Location: [facility];  Service: Vascular;  Laterality: Right;  . AV FISTULA PLACEMENT Left 09/10/2017   Procedure: INSERTION OF ARTERIOVENOUS (AV) GORE-TEX GRAFT  LEFT UPPER ARM;  Surgeon: Angelia Mould, MD;  Location: [facility];  Service: Vascular;  Laterality: Left;  . BASCILIC VEIN TRANSPOSITION Right 04/14/2017   Procedure: BASCILIC VEIN TRANSPOSITION-RIGHT 2ND STAGE;  Surgeon: Rosetta Posner, MD;  Location: [facility];  Service: Vascular;  Laterality: Right;  . CESAREAN SECTION  10/18/2006  . INSERTION OF DIALYSIS CATHETER    . PERIPHERAL VASCULAR BALLOON ANGIOPLASTY Right 04/03/2020   Procedure: PERIPHERAL VASCULAR BALLOON ANGIOPLASTY;  Surgeon: Marty Heck, MD;  Location: [facility];  Service: Cardiovascular;  Laterality: Right;  SFA  . WOUND DEBRIDEMENT Right 04/04/2020   Procedure: RIGHT FOOT DEBRIDEMENT;  Surgeon: Waynetta Sandy, MD;  Location: [facility];  Service: Vascular;  Laterality: Right;     OB History   No obstetric history on file.     Family History  Problem Relation Age of Onset  .  Hypertension Mother   . Diabetes Mother   . Diabetes Father     Social History   Tobacco Use  . Smoking status: Former Smoker    Packs/day: 3.00    Years: 10.00    Pack years: 30.00    Types: Cigarettes    Quit date: 09/21/2004    Years since quitting: 16.0  . Smokeless tobacco: Never Used  Vaping Use  . Vaping Use: Never used  Substance Use Topics  . Alcohol use: No  . Drug use: No    Home Medications Prior to Admission medications   Medication Sig Start Date End Date Taking? Authorizing  Provider  amLODipine (NORVASC) 10 MG tablet Take 10 mg by mouth daily.    [provider]  aspirin EC 81 MG tablet Take 81 mg by mouth daily. Swallow whole.    [provider]  atorvastatin (LIPITOR) 40 MG tablet Take 40 mg by mouth daily.  06/08/19   [provider]  calcitRIOL (ROCALTROL) 0.5 MCG capsule Take 3 capsules (1.5 mcg total) by mouth Every Tuesday,Thursday,and Saturday with dialysis. Patient taking differently: Take 1.5 mcg by mouth every Monday, Wednesday, and Friday. 11/13/17   Debbe Odea, MD  calcium carbonate (OS-CAL) 1250 (500 Ca) MG chewable tablet Chew 1 tablet by mouth 3 (three) times daily.  01/14/16   [provider]  cinacalcet (SENSIPAR) 30 MG tablet Take 1 tablet (30 mg total) by mouth Every Tuesday,Thursday,and Saturday with dialysis. Patient taking differently: Take 30 mg by mouth every Monday, Wednesday, and Friday with hemodialysis. 11/13/17   Debbe Odea, MD  clopidogrel (PLAVIX) 75 MG tablet Take 1 tablet (75 mg total) by mouth daily with breakfast. 04/17/20   Rhyne, Hulen Shouts, PA-C  Darbepoetin Alfa (ARANESP) 200 MCG/0.4ML SOSY injection Inject 0.4 mLs (200 mcg total) into the vein every Thursday with hemodialysis. Patient not taking: Reported on 09/12/2020 02/03/18   Bonnita Hollow, MD  esomeprazole (NEXIUM) 40 MG capsule Take 40 mg by mouth daily. 06/29/11   [provider]  ferric gluconate 62.5 mg in sodium chloride 0.9 % 100 mL Inject 62.5 mg into the vein every Thursday with hemodialysis. Patient not taking: Reported on 09/12/2020 02/03/18   Bonnita Hollow, MD  hydrALAZINE (APRESOLINE) 25 MG tablet Take 25 mg by mouth daily. 06/08/19   [provider]  insulin glargine (LANTUS) 100 UNIT/ML injection Inject 9 Units into the skin at bedtime. 01/14/16   [provider]  lidocaine-prilocaine (EMLA) cream Apply 1 application topically daily as needed (pain). 05/02/19   [provider]   metoprolol succinate (TOPROL-XL) 25 MG 24 hr tablet Take 0.5 tablets (12.5 mg total) by mouth daily. Patient not taking: Reported on 09/12/2020 07/10/18 02/20/28  Bonnielee Haff, MD  multivitamin (RENA-VIT) TABS tablet Take 1 tablet by mouth at bedtime. Patient not taking: Reported on 09/12/2020 11/13/17   Debbe Odea, MD  naloxone [facility]) 4 MG/0.1ML LIQD nasal spray kit Place 0.4 mg into the nose once. Repeat after 3 minutes if no or minimal response 01/18/20   [provider]  NOVOFINE 32G X 6 MM MISC  02/01/20   [provider]  oxyCODONE (OXY IR/ROXICODONE) 5 MG immediate release tablet Take 1 tablet (5 mg total) by mouth every 4 (four) hours as needed for moderate pain or severe pain. Patient not taking: No sig reported 04/17/20   Rhyne, Hulen Shouts, PA-C  pantoprazole (PROTONIX) 20 MG tablet TAKE 1 TABLET(20 MG) BY MOUTH EVERY DAY Patient not taking:  Reported on 09/12/2020 02/06/20   Lillard Anes, MD  polyethylene glycol [facility] / Floria Raveling) packet Take 17 g by mouth daily. Patient not taking: Reported on 09/12/2020 02/02/18   Bonnita Hollow, MD    Allergies    Baclofen, Penicillins, Morphine and related, and Novolog [insulin aspart]  Review of Systems   Review of Systems  Constitutional: Negative for chills and fever.  Respiratory: Positive for shortness of breath.   Cardiovascular: Negative for chest pain.  Gastrointestinal: Negative for abdominal pain and vomiting.  Neurological: Negative for syncope.  All other systems reviewed and are negative.   Physical Exam Updated Vital Signs BP (!) 200/108 (BP Location: Right Arm)   Pulse 87   Temp 98.5 F (36.9 C) (Oral)   Resp 16   SpO2 100%   Physical Exam Vitals and nursing note reviewed.  Constitutional:      Appearance: She is well-developed. She is not toxic-appearing.  HENT:     Head: Normocephalic and atraumatic.  Eyes:     General:        Right eye: No discharge.        Left eye: No  discharge.     Conjunctiva/sclera: Conjunctivae normal.  Cardiovascular:     Rate and Rhythm: Normal rate and regular rhythm.  Pulmonary:     Effort: No respiratory distress.     Comments: Speaking in short sentences. Mildly dyspneic at rest.  Course bibasilar breath sounds.  Abdominal:     General: There is no distension.     Palpations: Abdomen is soft.     Tenderness: There is no abdominal tenderness.  Musculoskeletal:     Cervical back: Neck supple.     Comments: Bilateral BKA present.   Skin:    General: Skin is warm and dry.     Findings: No rash.  Neurological:     Mental Status: She is alert.     Comments: Clear speech.   Psychiatric:        Behavior: Behavior normal.     ED Results / Procedures / Treatments   Labs (all labs ordered are listed, but only abnormal results are displayed) Labs Reviewed  BASIC METABOLIC PANEL - Abnormal; Notable for the following components:      Result Value   Potassium 5.3 (*)    Glucose, Bld 134 (*)    BUN 47 (*)    Creatinine, Ser 11.03 (*)    GFR, Estimated 4 (*)    Anion gap 16 (*)    All other components within normal limits  CBC - Abnormal; Notable for the following components:   Hemoglobin 10.6 (*)    MCH 25.2 (*)    MCHC 29.4 (*)    RDW 20.5 (*)    Platelets 98 (*)    All other components within normal limits  RESP PANEL BY RT-PCR (FLU A&B, COVID) ARPGX2  CBG MONITORING, ED  I-STAT BETA HCG BLOOD, ED ([facility], [facility], [facility] ONLY)    EKG EKG Interpretation  Date/Time:  Wednesday October 02 2020 21:31:11 EST Ventricular Rate:  90 PR Interval:  164 QRS Duration: 90 QT Interval:  368 QTC Calculation: 450 R Axis:   103 Text Interpretation: Normal sinus rhythm with sinus arrhythmia Rightward axis Nonspecific T wave abnormality Abnormal ECG When compared with ECG of 09/12/2020, No significant change was found Confirmed by Delora Fuel ([id number]) on 10/02/2020 11:16:08 PM   Radiology DG Chest 2 View  Result Date:  10/02/2020 CLINICAL DATA:  Shortness of  breath EXAM: CHEST - 2 VIEW COMPARISON:  09/28/2020 FINDINGS: Cardiomegaly, vascular congestion. Bibasilar atelectasis. No effusions or acute bony abnormality. IMPRESSION: Cardiomegaly with vascular congestion and bibasilar atelectasis. Electronically Signed   By: Rolm Baptise M.D.   On: 10/02/2020 22:04    Procedures Procedures (including critical care time)  Medications Ordered in ED Medications - No data to display  ED Course  I have reviewed the triage vital signs and the nursing notes.  Pertinent labs & imaging results that were available during my care of the patient were reviewed by me and considered in my medical decision making (see chart for details).    MDM Rules/Calculators/A&P                         Patient presents to the ED with complaints of dyspnea requesting dialysis. She is nontoxic, notably hypertensive, on 3L of oxygen for symptomatic management- when turned off she does desaturate to upper 80s, does not currently have oxygen at home.   Additional history obtained:  Additional history obtained from chart review & nursing note review.  EKG: No significant change compared to prior.   Lab Tests:  Labs ordered by triage which I have reviewed & interpreted labs, which included:  CBC: Anemia similar to prior ranges. Thrombocytopenia somewhat worse.  BMP: Consistent w/ ESRD- potassium 5.3  Imaging Studies ordered:  CXR ordered by triage team, I independently visualized and interpreted imaging which showed Cardiomegaly with vascular congestion and bibasilar atelectasis.  ED Course:  Given patient with hypoxia, mild dyspnea, and pulmonary vascular congestion feel she cannot go home safely and will need dialysis. Will discuss w/ nephrology.   23:29: CONSULT: Discussed with nephrologist Dr. Marval Regal- cannot dialyze till AM due to several patients needing dialysis, will proceed with lokelma, hydralazine & clonidine per discussion  with nephrology, pending dialysis in the morning. Continue oxygen therapy in the ED.   Patient BP improved S/p above interventions. She remains on 3L, does not appear to require escalation in supplementary oxygen at this time. She continues to be pending dialysis treatment. AM ED team aware of patient.   This is a shared visit with supervising physician Dr. Roxanne Mins who has independently evaluated patient & provided guidance in evaluation/management/disposition, in agreement with care   Portions of this note were generated with Dragon dictation software. Dictation errors may occur despite best attempts at proofreading.  Final Clinical Impression(s) / ED Diagnoses Final diagnoses:  ESRD (end stage renal disease) [facility])    Rx / DC Orders ED Discharge Orders    None       Leafy Kindle [birth year]46 [id number]    Delora Fuel, MD 22/57/50 [phone]

## 2020-10-03 DIAGNOSIS — N186 End stage renal disease: Secondary | ICD-10-CM | POA: Diagnosis not present

## 2020-10-03 LAB — CBC
HCT: 33.4 % — ABNORMAL LOW (ref 36.0–46.0)
Hemoglobin: 10.1 g/dL — ABNORMAL LOW (ref 12.0–15.0)
MCH: 25.4 pg — ABNORMAL LOW (ref 26.0–34.0)
MCHC: 30.2 g/dL (ref 30.0–36.0)
MCV: 84.1 fL (ref 80.0–100.0)
Platelets: 96 10*3/uL — ABNORMAL LOW (ref 150–400)
RBC: 3.97 MIL/uL (ref 3.87–5.11)
RDW: 20.2 % — ABNORMAL HIGH (ref 11.5–15.5)
WBC: 5.3 10*3/uL (ref 4.0–10.5)
nRBC: 0 % (ref 0.0–0.2)

## 2020-10-03 LAB — RENAL FUNCTION PANEL
Albumin: 3.4 g/dL — ABNORMAL LOW (ref 3.5–5.0)
Anion gap: 17 — ABNORMAL HIGH (ref 5–15)
BUN: 51 mg/dL — ABNORMAL HIGH (ref 6–20)
CO2: 24 mmol/L (ref 22–32)
Calcium: 9.1 mg/dL (ref 8.9–10.3)
Chloride: 98 mmol/L (ref 98–111)
Creatinine, Ser: 11.39 mg/dL — ABNORMAL HIGH (ref 0.44–1.00)
GFR, Estimated: 4 mL/min — ABNORMAL LOW (ref >60)
Glucose, Bld: 101 mg/dL — ABNORMAL HIGH (ref 70–99)
Phosphorus: 8.8 mg/dL — ABNORMAL HIGH (ref 2.5–4.6)
Potassium: 5.5 mmol/L — ABNORMAL HIGH (ref 3.5–5.1)
Sodium: 139 mmol/L (ref 135–145)

## 2020-10-03 LAB — RESP PANEL BY RT-PCR (FLU A&B, COVID) ARPGX2
Influenza A by PCR: NEGATIVE
Influenza B by PCR: NEGATIVE
SARS Coronavirus 2 by RT PCR: NEGATIVE

## 2020-10-03 MED ORDER — HEPARIN SODIUM (PORCINE) 1000 UNIT/ML DIALYSIS: 1000.0000 [IU] | INTRAMUSCULAR | Status: DC | PRN

## 2020-10-03 MED ORDER — PENTAFLUOROPROP-TETRAFLUOROETH EX AERO: 1.0000 "application " | INHALATION_SPRAY | CUTANEOUS | Status: DC | PRN

## 2020-10-03 MED ORDER — SODIUM CHLORIDE 0.9 % IV SOLN
100.0000 mL | INTRAVENOUS | Status: DC | PRN
Start: 2020-10-03 — End: 2020-10-03

## 2020-10-03 MED ORDER — LIDOCAINE-PRILOCAINE 2.5-2.5 % EX CREA: 1.0000 "application " | TOPICAL_CREAM | CUTANEOUS | Status: DC | PRN

## 2020-10-03 MED ORDER — LIDOCAINE HCL (PF) 1 % IJ SOLN: 5.0000 mL | INTRAMUSCULAR | Status: DC | PRN

## 2020-10-03 MED ORDER — SODIUM CHLORIDE 0.9 % IV SOLN: 100.0000 mL | INTRAVENOUS | Status: DC | PRN

## 2020-10-03 MED ORDER — ALTEPLASE 2 MG IJ SOLR: 2.0000 mg | Freq: Once | INTRAMUSCULAR | Status: DC | PRN

## 2020-10-03 MED ORDER — LIDOCAINE-PRILOCAINE 2.5-2.5 % EX CREA
1.0000 | TOPICAL_CREAM | CUTANEOUS | Status: DC | PRN
Start: 2020-10-03 — End: 2020-10-03

## 2020-10-03 MED ORDER — CHLORHEXIDINE GLUCONATE CLOTH 2 % EX PADS: 6.0000 | MEDICATED_PAD | Freq: Every day | CUTANEOUS | Status: DC

## 2020-10-03 NOTE — ED Notes (Signed)
[facility] called for transport home

## 2020-10-03 NOTE — ED Notes (Signed)
Pt removed all monitoring equipment. States "I just want to sleep".

## 2020-10-03 NOTE — Discharge Instructions (Addendum)
You have received your dialysis today.  Please resume your regular dialysis schedule.

## 2020-10-03 NOTE — ED Notes (Signed)
Called [facility] at 12:50 - possible ETA 1.5 hrs

## 2020-10-03 NOTE — Procedures (Signed)
Seen on HD upstairs. Pt w/o any c/o's. SOB feeling better since HD started. No abd pain or CP. Not in distress. Will plan to send back to ED after HD for reassessment before dc home.   I was present at this dialysis session, have reviewed the session itself and made  appropriate changes Kelly Splinter MD [place] pager [phone]   10/03/2020, 11:50 AM

## 2020-10-17 ENCOUNTER — Inpatient Hospital Stay (HOSPITAL_COMMUNITY)
Admission: EM | Admit: 2020-10-17 | Discharge: 2020-10-24 | DRG: 177 | Disposition: A | Discharge status: Skilled Nursing Facility | Payer: Medicare Other | Attending: Family Medicine | Admitting: Family Medicine

## 2020-10-17 ENCOUNTER — Emergency Department (HOSPITAL_COMMUNITY): Payer: Medicare Other

## 2020-10-17 ENCOUNTER — Encounter (HOSPITAL_COMMUNITY): Payer: Self-pay

## 2020-10-17 DIAGNOSIS — I132 Hypertensive heart and chronic kidney disease with heart failure and with stage 5 chronic kidney disease, or end stage renal disease: Secondary | ICD-10-CM | POA: Diagnosis present

## 2020-10-17 DIAGNOSIS — Z8249 Family history of ischemic heart disease and other diseases of the circulatory system: Secondary | ICD-10-CM

## 2020-10-17 DIAGNOSIS — D6949 Other primary thrombocytopenia: Secondary | ICD-10-CM | POA: Diagnosis present

## 2020-10-17 DIAGNOSIS — K219 Gastro-esophageal reflux disease without esophagitis: Secondary | ICD-10-CM | POA: Diagnosis present

## 2020-10-17 DIAGNOSIS — Z20822 Contact with and (suspected) exposure to covid-19: Secondary | ICD-10-CM | POA: Diagnosis present

## 2020-10-17 DIAGNOSIS — I5022 Chronic systolic (congestive) heart failure: Secondary | ICD-10-CM | POA: Diagnosis present

## 2020-10-17 DIAGNOSIS — E1142 Type 2 diabetes mellitus with diabetic polyneuropathy: Secondary | ICD-10-CM | POA: Diagnosis present

## 2020-10-17 DIAGNOSIS — J9601 Acute respiratory failure with hypoxia: Secondary | ICD-10-CM | POA: Diagnosis not present

## 2020-10-17 DIAGNOSIS — R0600 Dyspnea, unspecified: Secondary | ICD-10-CM | POA: Diagnosis present

## 2020-10-17 DIAGNOSIS — Z87891 Personal history of nicotine dependence: Secondary | ICD-10-CM

## 2020-10-17 DIAGNOSIS — Z89519 Acquired absence of unspecified leg below knee: Secondary | ICD-10-CM

## 2020-10-17 DIAGNOSIS — IMO0002 Reserved for concepts with insufficient information to code with codable children: Secondary | ICD-10-CM

## 2020-10-17 DIAGNOSIS — D638 Anemia in other chronic diseases classified elsewhere: Secondary | ICD-10-CM | POA: Diagnosis present

## 2020-10-17 DIAGNOSIS — Z992 Dependence on renal dialysis: Secondary | ICD-10-CM | POA: Diagnosis not present

## 2020-10-17 DIAGNOSIS — Z7982 Long term (current) use of aspirin: Secondary | ICD-10-CM

## 2020-10-17 DIAGNOSIS — I160 Hypertensive urgency: Secondary | ICD-10-CM | POA: Diagnosis present

## 2020-10-17 DIAGNOSIS — J690 Pneumonitis due to inhalation of food and vomit: Secondary | ICD-10-CM | POA: Diagnosis present

## 2020-10-17 DIAGNOSIS — E1122 Type 2 diabetes mellitus with diabetic chronic kidney disease: Secondary | ICD-10-CM | POA: Diagnosis present

## 2020-10-17 DIAGNOSIS — I739 Peripheral vascular disease, unspecified: Secondary | ICD-10-CM | POA: Diagnosis present

## 2020-10-17 DIAGNOSIS — Z885 Allergy status to narcotic agent status: Secondary | ICD-10-CM

## 2020-10-17 DIAGNOSIS — Z9119 Patient's noncompliance with other medical treatment and regimen: Secondary | ICD-10-CM

## 2020-10-17 DIAGNOSIS — E8770 Fluid overload, unspecified: Secondary | ICD-10-CM | POA: Diagnosis present

## 2020-10-17 DIAGNOSIS — Z888 Allergy status to other drugs, medicaments and biological substances status: Secondary | ICD-10-CM

## 2020-10-17 DIAGNOSIS — M898X9 Other specified disorders of bone, unspecified site: Secondary | ICD-10-CM | POA: Diagnosis present

## 2020-10-17 DIAGNOSIS — Z794 Long term (current) use of insulin: Secondary | ICD-10-CM

## 2020-10-17 DIAGNOSIS — Z89512 Acquired absence of left leg below knee: Secondary | ICD-10-CM

## 2020-10-17 DIAGNOSIS — E785 Hyperlipidemia, unspecified: Secondary | ICD-10-CM | POA: Diagnosis present

## 2020-10-17 DIAGNOSIS — I255 Ischemic cardiomyopathy: Secondary | ICD-10-CM | POA: Diagnosis present

## 2020-10-17 DIAGNOSIS — N2581 Secondary hyperparathyroidism of renal origin: Secondary | ICD-10-CM | POA: Diagnosis present

## 2020-10-17 DIAGNOSIS — I6930 Unspecified sequelae of cerebral infarction: Secondary | ICD-10-CM

## 2020-10-17 DIAGNOSIS — Z7902 Long term (current) use of antithrombotics/antiplatelets: Secondary | ICD-10-CM

## 2020-10-17 DIAGNOSIS — R197 Diarrhea, unspecified: Secondary | ICD-10-CM | POA: Diagnosis not present

## 2020-10-17 DIAGNOSIS — G894 Chronic pain syndrome: Secondary | ICD-10-CM | POA: Diagnosis present

## 2020-10-17 DIAGNOSIS — E1165 Type 2 diabetes mellitus with hyperglycemia: Secondary | ICD-10-CM | POA: Diagnosis present

## 2020-10-17 DIAGNOSIS — Z880 Allergy status to penicillin: Secondary | ICD-10-CM

## 2020-10-17 DIAGNOSIS — J189 Pneumonia, unspecified organism: Secondary | ICD-10-CM

## 2020-10-17 DIAGNOSIS — K5900 Constipation, unspecified: Secondary | ICD-10-CM | POA: Diagnosis not present

## 2020-10-17 DIAGNOSIS — I69321 Dysphasia following cerebral infarction: Secondary | ICD-10-CM

## 2020-10-17 DIAGNOSIS — Z833 Family history of diabetes mellitus: Secondary | ICD-10-CM

## 2020-10-17 DIAGNOSIS — E8779 Other fluid overload: Secondary | ICD-10-CM | POA: Diagnosis present

## 2020-10-17 DIAGNOSIS — E669 Obesity, unspecified: Secondary | ICD-10-CM | POA: Diagnosis present

## 2020-10-17 DIAGNOSIS — R112 Nausea with vomiting, unspecified: Secondary | ICD-10-CM | POA: Diagnosis present

## 2020-10-17 DIAGNOSIS — D696 Thrombocytopenia, unspecified: Secondary | ICD-10-CM | POA: Diagnosis present

## 2020-10-17 DIAGNOSIS — N186 End stage renal disease: Secondary | ICD-10-CM | POA: Diagnosis present

## 2020-10-17 DIAGNOSIS — Z79899 Other long term (current) drug therapy: Secondary | ICD-10-CM

## 2020-10-17 DIAGNOSIS — I10 Essential (primary) hypertension: Secondary | ICD-10-CM | POA: Diagnosis not present

## 2020-10-17 DIAGNOSIS — E875 Hyperkalemia: Secondary | ICD-10-CM | POA: Diagnosis present

## 2020-10-17 DIAGNOSIS — Z89511 Acquired absence of right leg below knee: Secondary | ICD-10-CM

## 2020-10-17 DIAGNOSIS — I69322 Dysarthria following cerebral infarction: Secondary | ICD-10-CM

## 2020-10-17 DIAGNOSIS — E876 Hypokalemia: Secondary | ICD-10-CM | POA: Diagnosis not present

## 2020-10-17 LAB — GASTROINTESTINAL PANEL BY PCR, STOOL (REPLACES STOOL CULTURE)

## 2020-10-17 LAB — COMPREHENSIVE METABOLIC PANEL
ALT: 9 U/L (ref 0–44)
AST: 14 U/L — ABNORMAL LOW (ref 15–41)
Albumin: 3.5 g/dL (ref 3.5–5.0)
Alkaline Phosphatase: 85 U/L (ref 38–126)
Anion gap: 16 — ABNORMAL HIGH (ref 5–15)
BUN: 23 mg/dL — ABNORMAL HIGH (ref 6–20)
CO2: 27 mmol/L (ref 22–32)
Calcium: 9.6 mg/dL (ref 8.9–10.3)
Chloride: 98 mmol/L (ref 98–111)
Creatinine, Ser: 9.52 mg/dL — ABNORMAL HIGH (ref 0.44–1.00)
GFR, Estimated: 5 mL/min — ABNORMAL LOW (ref >60)
Glucose, Bld: 110 mg/dL — ABNORMAL HIGH (ref 70–99)
Potassium: 3.5 mmol/L (ref 3.5–5.1)
Sodium: 141 mmol/L (ref 135–145)
Total Bilirubin: 0.8 mg/dL (ref 0.3–1.2)
Total Protein: 7.3 g/dL (ref 6.5–8.1)

## 2020-10-17 LAB — CBC
HCT: 35.8 % — ABNORMAL LOW (ref 36.0–46.0)
Hemoglobin: 10.6 g/dL — ABNORMAL LOW (ref 12.0–15.0)
MCH: 25.8 pg — ABNORMAL LOW (ref 26.0–34.0)
MCHC: 29.6 g/dL — ABNORMAL LOW (ref 30.0–36.0)
MCV: 87.1 fL (ref 80.0–100.0)
Platelets: 89 10*3/uL — ABNORMAL LOW (ref 150–400)
RBC: 4.11 MIL/uL (ref 3.87–5.11)
RDW: 20.7 % — ABNORMAL HIGH (ref 11.5–15.5)
WBC: 4.8 10*3/uL (ref 4.0–10.5)
nRBC: 0 % (ref 0.0–0.2)

## 2020-10-17 LAB — C DIFFICILE QUICK SCREEN W PCR REFLEX
C Diff antigen: NEGATIVE
C Diff interpretation: NOT DETECTED
C Diff toxin: NEGATIVE

## 2020-10-17 LAB — I-STAT BETA HCG BLOOD, ED (MC, WL, AP ONLY): I-stat hCG, quantitative: <5 m[IU]/mL (ref <5)

## 2020-10-17 LAB — SARS CORONAVIRUS 2 BY RT PCR (HOSPITAL ORDER, PERFORMED IN ~~LOC~~ HOSPITAL LAB): SARS Coronavirus 2: NEGATIVE

## 2020-10-17 LAB — LIPASE, BLOOD: Lipase: 16 U/L (ref 11–51)

## 2020-10-17 LAB — HIV ANTIBODY (ROUTINE TESTING W REFLEX): HIV Screen 4th Generation wRfx: NONREACTIVE

## 2020-10-17 MED ORDER — ONDANSETRON 4 MG PO TBDP: 4.0000 mg | ORAL_TABLET | Freq: Three times a day (TID) | ORAL | 0 refills | Status: DC | PRN

## 2020-10-17 MED ORDER — METOPROLOL SUCCINATE ER 25 MG PO TB24
12.5000 mg | ORAL_TABLET | Freq: Every day | ORAL | Status: DC
  Administered 2020-10-17 – 2020-10-24 (×6): 12.5 mg via ORAL
  Filled 2020-10-17 (×9): qty 1

## 2020-10-17 MED ORDER — ATORVASTATIN CALCIUM 40 MG PO TABS
40.0000 mg | ORAL_TABLET | Freq: Every day | ORAL | Status: DC
  Administered 2020-10-17 – 2020-10-24 (×7): 40 mg via ORAL
  Filled 2020-10-17: qty 4
  Filled 2020-10-17 (×7): qty 1
  Filled 2020-10-17: qty 4
  Filled 2020-10-17: qty 1

## 2020-10-17 MED ORDER — SODIUM CHLORIDE 0.9% FLUSH
3.0000 mL | Freq: Two times a day (BID) | INTRAVENOUS | Status: DC
  Administered 2020-10-18 – 2020-10-24 (×9): 3 mL via INTRAVENOUS

## 2020-10-17 MED ORDER — PANTOPRAZOLE SODIUM 40 MG PO TBEC
40.0000 mg | DELAYED_RELEASE_TABLET | Freq: Every day | ORAL | Status: DC
Start: 2020-10-18 — End: 2020-10-25
  Administered 2020-10-18 – 2020-10-24 (×6): 40 mg via ORAL
  Filled 2020-10-17 (×8): qty 1

## 2020-10-17 MED ORDER — HYDRALAZINE HCL 20 MG/ML IJ SOLN
10.0000 mg | INTRAMUSCULAR | Status: DC | PRN
  Administered 2020-10-21 – 2020-10-22 (×2): 10 mg via INTRAVENOUS
  Filled 2020-10-17 (×2): qty 1

## 2020-10-17 MED ORDER — HEPARIN SODIUM (PORCINE) 5000 UNIT/ML IJ SOLN
INTRAMUSCULAR | Status: AC
  Administered 2020-10-17: 5000 [IU] via SUBCUTANEOUS
  Filled 2020-10-17: qty 1

## 2020-10-17 MED ORDER — ACETAMINOPHEN 650 MG RE SUPP: 650.0000 mg | Freq: Four times a day (QID) | RECTAL | Status: DC | PRN

## 2020-10-17 MED ORDER — INSULIN ASPART 100 UNIT/ML ~~LOC~~ SOLN
0.0000 [IU] | Freq: Three times a day (TID) | SUBCUTANEOUS | Status: DC
  Administered 2020-10-22: 1 [IU] via SUBCUTANEOUS

## 2020-10-17 MED ORDER — LIDOCAINE-PRILOCAINE 2.5-2.5 % EX CREA
1.0000 "application " | TOPICAL_CREAM | Freq: Every day | CUTANEOUS | Status: DC | PRN
  Administered 2020-10-19: 1 via TOPICAL
  Filled 2020-10-17 (×2): qty 5

## 2020-10-17 MED ORDER — ACETAMINOPHEN 325 MG PO TABS
650.0000 mg | ORAL_TABLET | Freq: Four times a day (QID) | ORAL | Status: DC | PRN
  Administered 2020-10-19: 650 mg via ORAL

## 2020-10-17 MED ORDER — ONDANSETRON HCL 4 MG/2ML IJ SOLN
4.0000 mg | Freq: Four times a day (QID) | INTRAMUSCULAR | Status: DC | PRN
  Administered 2020-10-21 – 2020-10-22 (×2): 4 mg via INTRAVENOUS
  Filled 2020-10-17 (×2): qty 2

## 2020-10-17 MED ORDER — HEPARIN SODIUM (PORCINE) 5000 UNIT/ML IJ SOLN: 5000.0000 [IU] | Freq: Three times a day (TID) | INTRAMUSCULAR | Status: DC

## 2020-10-17 MED ORDER — ONDANSETRON HCL 4 MG PO TABS: 4.0000 mg | ORAL_TABLET | Freq: Four times a day (QID) | ORAL | Status: DC | PRN

## 2020-10-17 MED ORDER — CALCITRIOL 0.25 MCG PO CAPS
1.5000 ug | ORAL_CAPSULE | ORAL | Status: DC
Start: 2020-10-18 — End: 2020-10-18
  Filled 2020-10-17 (×2): qty 3

## 2020-10-17 MED ORDER — IOHEXOL 300 MG/ML  SOLN
100.0000 mL | Freq: Once | INTRAMUSCULAR | Status: AC | PRN
  Administered 2020-10-17: 100 mL via INTRAVENOUS

## 2020-10-17 MED ORDER — AMLODIPINE BESYLATE 10 MG PO TABS
10.0000 mg | ORAL_TABLET | Freq: Every day | ORAL | Status: DC
  Administered 2020-10-17 – 2020-10-24 (×7): 10 mg via ORAL
  Filled 2020-10-17 (×5): qty 1
  Filled 2020-10-17: qty 2
  Filled 2020-10-17 (×3): qty 1
  Filled 2020-10-17: qty 2

## 2020-10-17 MED ORDER — RENA-VITE PO TABS
1.0000 | ORAL_TABLET | Freq: Every day | ORAL | Status: DC
  Administered 2020-10-18 – 2020-10-22 (×5): 1 via ORAL
  Filled 2020-10-17 (×6): qty 1

## 2020-10-17 MED ORDER — SODIUM CHLORIDE 0.9 % IV SOLN: 500.0000 mg | INTRAVENOUS | Status: DC

## 2020-10-17 MED ORDER — FUROSEMIDE 20 MG PO TABS
20.0000 mg | ORAL_TABLET | Freq: Every day | ORAL | Status: DC
  Administered 2020-10-17 – 2020-10-18 (×2): 20 mg via ORAL
  Filled 2020-10-17 (×2): qty 1

## 2020-10-17 MED ORDER — SODIUM CHLORIDE 0.9 % IV SOLN
1.0000 g | INTRAVENOUS | Status: DC
  Filled 2020-10-17: qty 1

## 2020-10-17 MED ORDER — CLOPIDOGREL BISULFATE 75 MG PO TABS
75.0000 mg | ORAL_TABLET | Freq: Every day | ORAL | Status: DC
  Administered 2020-10-17 – 2020-10-24 (×6): 75 mg via ORAL
  Filled 2020-10-17 (×8): qty 1

## 2020-10-17 MED ORDER — SODIUM CHLORIDE 0.9 % IV SOLN
500.0000 mg | INTRAVENOUS | Status: DC
  Administered 2020-10-18: 500 mg via INTRAVENOUS
  Filled 2020-10-17 (×3): qty 500

## 2020-10-17 MED ORDER — ONDANSETRON HCL 4 MG/2ML IJ SOLN
4.0000 mg | Freq: Once | INTRAMUSCULAR | Status: AC
  Administered 2020-10-17: 4 mg via INTRAVENOUS
  Filled 2020-10-17: qty 2

## 2020-10-17 MED ORDER — SODIUM CHLORIDE 0.9 % IV SOLN
2.0000 g | INTRAVENOUS | Status: DC
  Administered 2020-10-17 – 2020-10-20 (×4): 2 g via INTRAVENOUS
  Filled 2020-10-17 (×3): qty 20
  Filled 2020-10-17 (×2): qty 2
  Filled 2020-10-17: qty 20

## 2020-10-17 MED ORDER — CINACALCET HCL 30 MG PO TABS
30.0000 mg | ORAL_TABLET | ORAL | Status: DC
  Filled 2020-10-17: qty 1

## 2020-10-17 MED ORDER — LOPERAMIDE HCL 2 MG PO CAPS: 2.0000 mg | ORAL_CAPSULE | ORAL | Status: DC | PRN

## 2020-10-17 MED ORDER — ALBUTEROL SULFATE (2.5 MG/3ML) 0.083% IN NEBU
2.5000 mg | INHALATION_SOLUTION | Freq: Four times a day (QID) | RESPIRATORY_TRACT | Status: DC | PRN
  Administered 2020-10-19: 2.5 mg via RESPIRATORY_TRACT
  Filled 2020-10-17: qty 3

## 2020-10-17 NOTE — ED Provider Notes (Signed)
[facility] EMERGENCY DEPARTMENT Provider Note   CSN: [id number] Arrival date & time: 10/17/20  7342     History Chief Complaint  Patient presents with  . Diarrhea    Karen [name] is a 45 y.o. female.  HPI Presents for diarrhea and vomiting.  States she has had for 2 weeks.  States she has having issues with this since September however.  She is a Monday Wednesday Friday dialysis patient.  States she was dialyzed yesterday.  States that her weights have been down.  States she has had decreased oral intake.  Does have some dull abdominal pain with it.  States she feels weak all over.  No fevers.  Reviewing records appears to have a history of colitis.  No blood in the stool. States it is more diarrhea than vomiting.      Past Medical History:  Diagnosis Date  . Anemia   . Anxiety   . Eczema   . ESRD (end stage renal disease) ([place])    [place] - TTHSAT- [place]  . Exertional shortness of breath   . GERD (gastroesophageal reflux disease)   . Headache   . History of blood transfusion   . History of kidney stones    passed  . Hypertension   . Ischemic cardiomyopathy    by echo 2014  . Neuropathy   . Pneumonia 09/2016  . Type II diabetes mellitus ([place]) 1995    Patient Active Problem List   Diagnosis Date Noted  . Right leg pain   . Palliative care by specialist   . Nausea and vomiting 02/28/2020  . Abrasion 02/28/2020  . Avulsion fracture of ankle 02/28/2020  . Drug or medicinal substance causing adverse effect in therapeutic use 02/28/2020  . Elevated brain natriuretic peptide (BNP) level 02/28/2020  . HLD (hyperlipidemia) 02/28/2020  . Hypertension, poor control 02/28/2020  . Hypertensive emergency 02/28/2020  . Hypoxia 02/28/2020  . Mild dehydration 02/28/2020  . Sedimentation rate elevation 02/28/2020  . Strange and inexplicable behavior 87/68/1157  . COVID-19 01/19/2020  . Dysphasia status post cerebrovascular accident 11/06/2019  . Lumbar  spondylosis 11/06/2019  . Anxiety and depression 06/04/2019  . Cerebral infarction due to thrombosis of unspecified precerebral artery ([place]) 06/04/2019  . Chronic pain syndrome 06/04/2019  . Neck pain on right side 06/04/2019  . Stroke due to embolism of right middle cerebral artery ([place]) 06/02/2019  . Pulmonary edema 02/20/2019  . Excoriation (skin-picking) disorder 11/22/2018  . Type 2 diabetes mellitus with hyperglycemia ([place]) 11/14/2018  . SBO (small bowel obstruction) ([place]) 07/08/2018  . Small bowel obstruction ([place]) 07/08/2018  . Hypercalcemia 02/17/2018  . Weakness generalized   . Acute on chronic systolic CHF (congestive heart failure) ([place])   . ESRD (end stage renal disease) on dialysis ([place])   . Homelessness   . Late effect of cerebrovascular accident (CVA)   . Dyspnea 01/27/2018  . Hypertension, uncontrolled 11/11/2017  . Fluid overload 11/11/2017  . Abdominal pain   . Sepsis due to methicillin resistant Staphylococcus aureus ([place]) 11/01/2017  . Penicillin allergy 10/15/2017  . Thrombocytopenia ([place]) 10/15/2017  . Personal history of transient ischemic attack (TIA), and cerebral infarction without residual deficits 09/15/2017  . Dysarthria 09/14/2017  . Lacunar infarct, acute ([place]) 09/14/2017  . Obesity 09/09/2017  . Chest pain 09/07/2017  . Other staphylococcus as the cause of diseases classified elsewhere 08/23/2017  . Diarrhea, unspecified 12/29/2016  . Fluid overload, unspecified 10/03/2016  . Hyperkalemia 10/03/2016  . Hypertension with  fluid overload 10/03/2016  . Anemia in other chronic diseases classified elsewhere 09/08/2016  . High anion gap metabolic acidosis 65/11/5463  . Right lower lobe pneumonia 09/08/2016  . Iron deficiency anemia, unspecified 02/19/2016  . Complication of vascular dialysis catheter 02/18/2016  . Encounter for removal of sutures 02/11/2016  . Uncontrolled type 2 diabetes mellitus with chronic kidney disease on chronic dialysis, with  long-term current use of insulin ([place]) 01/31/2016  . Drug noncompliance   . End-stage renal disease on hemodialysis ([place])   . Unspecified protein-calorie malnutrition ([place]) 01/24/2016  . Secondary hyperparathyroidism of renal origin ([place]) 01/22/2016  . Acquired absence of left leg below knee ([place]) 01/16/2016  . CHF (congestive heart failure) ([place]) 01/16/2016  . Coagulation defect, unspecified ([place]) 01/16/2016  . DNR (do not resuscitate) discussion 01/16/2016  . Family history of diabetes mellitus 01/16/2016  . Hypertensive chronic kidney disease with stage 1 through stage 4 chronic kidney disease, or unspecified chronic kidney disease 01/16/2016  . Nicotine dependence, other tobacco product, in remission 01/16/2016  . Pain, unspecified 01/16/2016  . Pruritus, unspecified 01/16/2016  . Red blood cell antibody positive 01/10/2016  . Hx of amputation below knee, right ([place]) 07/03/2013  . S/P BKA (below knee amputation) unilateral ([place]) 06/30/2013  . Physical deconditioning 06/30/2013  . Cardiomyopathy, ischemic - EF 45-50% with inf WMA by 2D 02/05/13 02/06/2013  . Essential hypertension 02/05/2013  . PAD (peripheral artery disease) ([place]) 02/05/2013  . Diabetic neuropathy ([place])   . Osteomyelitis of left great toe - S/P amputation 02/06/13 04/21/2011    Past Surgical History:  Procedure Laterality Date  . ABDOMINAL AORTOGRAM W/LOWER EXTREMITY Right 04/03/2020   Procedure: ABDOMINAL AORTOGRAM W/LOWER EXTREMITY;  Surgeon: Marty Heck, MD;  Location: [facility];  Service: Cardiovascular;  Laterality: Right;  . AMPUTATION Left 02/06/2013   Procedure: AMPUTATION LEFT GREAT TOE;  Surgeon: Wylene Simmer, MD;  Location: [facility];  Service: Orthopedics;  Laterality: Left;  . AMPUTATION Left 06/24/2013   Procedure: AMPUTATION BELOW KNEE ;  Surgeon: Wylene Simmer, MD;  Location: [facility];  Service: Orthopedics;  Laterality: Left;  . AMPUTATION Right 04/10/2020   Procedure: RIGHT AMPUTATION BELOW  KNEE;  Surgeon: Waynetta Sandy, MD;  Location: [place];  Service: Vascular;  Laterality: Right;  . AV FISTULA PLACEMENT Right 02/08/2017   Procedure: CREATION OF RIGHT ARM  BASILIC VEIN TO BRACHIAL ARTERY ARTERIOVENOUS (AV) FISTULA;  Surgeon: Rosetta Posner, MD;  Location: [facility];  Service: Vascular;  Laterality: Right;  . AV FISTULA PLACEMENT Left 09/10/2017   Procedure: INSERTION OF ARTERIOVENOUS (AV) GORE-TEX GRAFT  LEFT UPPER ARM;  Surgeon: Angelia Mould, MD;  Location: [facility];  Service: Vascular;  Laterality: Left;  . BASCILIC VEIN TRANSPOSITION Right 04/14/2017   Procedure: BASCILIC VEIN TRANSPOSITION-RIGHT 2ND STAGE;  Surgeon: Rosetta Posner, MD;  Location: [facility];  Service: Vascular;  Laterality: Right;  . CESAREAN SECTION  10/18/2006  . INSERTION OF DIALYSIS CATHETER    . PERIPHERAL VASCULAR BALLOON ANGIOPLASTY Right 04/03/2020   Procedure: PERIPHERAL VASCULAR BALLOON ANGIOPLASTY;  Surgeon: Marty Heck, MD;  Location: [facility];  Service: Cardiovascular;  Laterality: Right;  SFA  . WOUND DEBRIDEMENT Right 04/04/2020   Procedure: RIGHT FOOT DEBRIDEMENT;  Surgeon: Waynetta Sandy, MD;  Location: [facility];  Service: Vascular;  Laterality: Right;     OB History   No obstetric history on file.     Family History  Problem Relation Age of Onset  .  Hypertension Mother   . Diabetes Mother   . Diabetes Father     Social History   Tobacco Use  . Smoking status: Former Smoker    Packs/day: 3.00    Years: 10.00    Pack years: 30.00    Types: Cigarettes    Quit date: 09/21/2004    Years since quitting: 16.0  . Smokeless tobacco: Never Used  Vaping Use  . Vaping Use: Never used  Substance Use Topics  . Alcohol use: No  . Drug use: No    Home Medications Prior to Admission medications   Medication Sig Start Date End Date Taking? Authorizing Provider  ondansetron (ZOFRAN-ODT) 4 MG disintegrating tablet Take 1 tablet (4 mg total) by mouth every 8  (eight) hours as needed for nausea or vomiting. 10/17/20  Yes Davonna Belling, MD  amLODipine (NORVASC) 10 MG tablet Take 10 mg by mouth daily.    [provider]  aspirin EC 81 MG tablet Take 81 mg by mouth daily. Swallow whole.    [provider]  atorvastatin (LIPITOR) 40 MG tablet Take 40 mg by mouth daily.  06/08/19   [provider]  calcitRIOL (ROCALTROL) 0.5 MCG capsule Take 3 capsules (1.5 mcg total) by mouth Every Tuesday,Thursday,and Saturday with dialysis. Patient taking differently: Take 1.5 mcg by mouth every Monday, Wednesday, and Friday. 11/13/17   Debbe Odea, MD  calcium carbonate (OS-CAL) 1250 (500 Ca) MG chewable tablet Chew 1 tablet by mouth 3 (three) times daily.  01/14/16   [provider]  cinacalcet (SENSIPAR) 30 MG tablet Take 1 tablet (30 mg total) by mouth Every Tuesday,Thursday,and Saturday with dialysis. Patient taking differently: Take 30 mg by mouth every Monday, Wednesday, and Friday with hemodialysis. 11/13/17   Debbe Odea, MD  clopidogrel (PLAVIX) 75 MG tablet Take 1 tablet (75 mg total) by mouth daily with breakfast. 04/17/20   Rhyne, Hulen Shouts, PA-C  Darbepoetin Alfa (ARANESP) 200 MCG/0.4ML SOSY injection Inject 0.4 mLs (200 mcg total) into the vein every Thursday with hemodialysis. Patient not taking: Reported on 09/12/2020 02/03/18   Bonnita Hollow, MD  esomeprazole (NEXIUM) 40 MG capsule Take 40 mg by mouth daily. 06/29/11   [provider]  ferric gluconate 62.5 mg in sodium chloride 0.9 % 100 mL Inject 62.5 mg into the vein every Thursday with hemodialysis. Patient not taking: Reported on 09/12/2020 02/03/18   Bonnita Hollow, MD  furosemide (LASIX) 20 MG tablet Take 20 mg by mouth daily. 10/02/20   [provider]  hydrALAZINE (APRESOLINE) 25 MG tablet Take 25 mg by mouth daily. 06/08/19   [provider]  insulin glargine (LANTUS) 100 UNIT/ML injection Inject 9 Units into the skin at bedtime.  01/14/16   [provider]  lidocaine-prilocaine (EMLA) cream Apply 1 application topically daily as needed (pain). 05/02/19   [provider]  metoprolol succinate (TOPROL-XL) 25 MG 24 hr tablet Take 0.5 tablets (12.5 mg total) by mouth daily. Patient not taking: Reported on 09/12/2020 07/10/18 02/20/28  Bonnielee Haff, MD  multivitamin (RENA-VIT) TABS tablet Take 1 tablet by mouth at bedtime. Patient not taking: Reported on 09/12/2020 11/13/17   Debbe Odea, MD  naloxone [facility]) 4 MG/0.1ML LIQD nasal spray kit Place 0.4 mg into the nose once. Repeat after 3 minutes if no or minimal response 01/18/20   [provider]  NOVOFINE 32G X 6 MM MISC  02/01/20   [provider]  oxyCODONE (OXY IR/ROXICODONE) 5 MG immediate release tablet Take  1 tablet (5 mg total) by mouth every 4 (four) hours as needed for moderate pain or severe pain. Patient not taking: No sig reported 04/17/20   Gabriel Earing, PA-C  pantoprazole (PROTONIX) 20 MG tablet TAKE 1 TABLET(20 MG) BY MOUTH EVERY DAY Patient not taking: Reported on 09/12/2020 02/06/20   Lillard Anes, MD  polyethylene glycol [facility] / Floria Raveling) packet Take 17 g by mouth daily. Patient not taking: Reported on 09/12/2020 02/02/18   Bonnita Hollow, MD    Allergies    Baclofen, Penicillins, Morphine and related, and Novolog [insulin aspart]  Review of Systems   Review of Systems  Constitutional: Positive for appetite change.  HENT: Negative for congestion.   Respiratory: Negative for shortness of breath.   Gastrointestinal: Positive for abdominal pain, diarrhea, nausea and vomiting.  Genitourinary: Negative for dysuria.  Musculoskeletal: Negative for back pain.  Skin: Negative for rash.  Neurological: Negative for weakness.  Psychiatric/Behavioral: Negative for confusion.    Physical Exam Updated Vital Signs BP (!) 174/92   Pulse 88   Temp 98.3 F (36.8 C) (Oral)   Resp 11   SpO2 (!) 82%    Physical Exam Vitals and nursing note reviewed.  HENT:     Head: Atraumatic.  Eyes:     Extraocular Movements: Extraocular movements intact.     Pupils: Pupils are equal, round, and reactive to light.  Cardiovascular:     Rate and Rhythm: Regular rhythm.  Pulmonary:     Breath sounds: No wheezing or rhonchi.  Abdominal:     Comments: Somewhat diffuse tenderness with some fullness in upper abdomen.  Musculoskeletal:     Cervical back: Neck supple.     Comments: Bilateral below the knee amputations.  Skin:    General: Skin is warm.     Capillary Refill: Capillary refill takes less than 2 seconds.     Comments: Papular rash on lower extremities and hands.  Chronic per patient.  Neurological:     Mental Status: She is alert and oriented to person, place, and time.     ED Results / Procedures / Treatments   Labs (all labs ordered are listed, but only abnormal results are displayed) Labs Reviewed  COMPREHENSIVE METABOLIC PANEL - Abnormal; Notable for the following components:      Result Value   Glucose, Bld 110 (*)    BUN 23 (*)    Creatinine, Ser 9.52 (*)    AST 14 (*)    GFR, Estimated 5 (*)    Anion gap 16 (*)    All other components within normal limits  CBC - Abnormal; Notable for the following components:   Hemoglobin 10.6 (*)    HCT 35.8 (*)    MCH 25.8 (*)    MCHC 29.6 (*)    RDW 20.7 (*)    Platelets 89 (*)    All other components within normal limits  GASTROINTESTINAL PANEL BY PCR, STOOL (REPLACES STOOL CULTURE)  C DIFFICILE QUICK SCREEN W PCR REFLEX  SARS CORONAVIRUS 2 BY RT PCR (HOSPITAL ORDER, [facility])  LIPASE, BLOOD  URINALYSIS, ROUTINE W REFLEX MICROSCOPIC  I-STAT BETA HCG BLOOD, ED ([facility], [facility], [facility] ONLY)    EKG None  Radiology CT ABDOMEN PELVIS W CONTRAST  Result Date: 10/17/2020 CLINICAL DATA:  Diarrhea and vomiting.  Chronic renal failure EXAM: CT ABDOMEN AND PELVIS WITH CONTRAST TECHNIQUE: Multidetector CT  imaging of the abdomen and pelvis was performed using the standard protocol  following bolus administration of intravenous contrast. CONTRAST:  169m OMNIPAQUE IOHEXOL 300 MG/ML  SOLN COMPARISON:  CT abdomen and pelvis July 19, 2020 FINDINGS: Lower chest: There is airspace opacity in the posterior right base consistent with pneumonia. There is mild bibasilar interstitial pulmonary edema. There is cardiomegaly with multiple foci of coronary artery calcification. Hepatobiliary: Liver measures 23.0 cm in length. There is decreased attenuation in the liver, likely due to hepatic steatosis. Liver contour is subtly nodular. No focal liver lesions are evident. Gallbladder is contracted. There is no appreciable biliary duct dilatation. Pancreas: There is no pancreatic mass or inflammatory focus. Spleen: No splenic lesions are evident. Adrenals/Urinary Tract: Adrenals bilaterally appear normal. No evident renal mass. No hydronephrosis on either side. There is extensive peripheral renal artery calcification. No evident renal or ureteral calculus. Urinary bladder is midline with wall thickness borderline increased. Stomach/Bowel: There is no appreciable bowel wall or mesenteric thickening. No evident bowel obstruction. The terminal ileum appears normal. There is no free air or portal venous air. Vascular/Lymphatic: No abdominal aortic aneurysm. There is aortic and pelvic arterial vascular calcification. Calcification is also noted in peripheral renal arteries as well as in portions of the hepatic artery. No adenopathy is appreciable in the abdomen or pelvis. Reproductive: Uterus is anteverted. Periuterine arterial vascular calcification noted bilaterally. No adnexal masses are evident. Other: Appendix appears normal. There is no evident abscess or ascites in the abdomen pelvis. There is mild edema in the abdominal wall, a likely degree of mild anasarca. There is mild fat in the umbilicus. Musculoskeletal: No blastic or lytic  bone lesions. Foci of degenerative change noted in the lumbar spine. No intramuscular lesions are evident. IMPRESSION: 1. Apparent focus of pneumonia in the posterior right base. There is apparent degree of interstitial edema in the lung bases. 2. Enlarged liver with subtle nodular contour of the liver suggesting underlying cirrhosis. No focal liver lesions are appreciable. 3. No bowel obstruction. No abscess in the abdomen or pelvis. Appendix appears normal. 4. Aortic Atherosclerosis (ICD10-I70.0). Extensive arterial and pelvic vascular calcification throughout the abdomen and pelvis. Foci of coronary artery calcification noted. 5.  Mild anasarca in the abdominal wall. Electronically Signed   By: WLowella GripIII M.D.   On: 10/17/2020 12:01   DG Abdomen Acute W/Chest  Result Date: 10/17/2020 CLINICAL DATA:  Abdominal pain, diarrhea.  Dialysis patient EXAM: DG ABDOMEN ACUTE WITH 1 VIEW CHEST COMPARISON:  Chest 10/02/2020 FINDINGS: Cardiac enlargement. Vascular congestion with mild interstitial edema. Small right effusion. Normal bowel gas pattern. No obstruction or free air. No renal calculi. Diffuse arterial calcification. IMPRESSION: Vascular congestion with mild interstitial edema and small right effusion Normal bowel gas pattern. Electronically Signed   By: CFranchot GalloM.D.   On: 10/17/2020 08:06    Procedures Procedures   Medications Ordered in ED Medications  iohexol (OMNIPAQUE) 300 MG/ML solution 100 mL (100 mLs Intravenous Contrast Given 10/17/20 1149)  ondansetron (ZOFRAN) injection 4 mg (4 mg Intravenous Given 10/17/20 1501)    ED Course  I have reviewed the triage vital signs and the nursing notes.  Pertinent labs & imaging results that were available during my care of the patient were reviewed by me and considered in my medical decision making (see chart for details).    MDM Rules/Calculators/A&P                          Patient presented with diarrhea vomiting.  Monday  Wednesday for  a dialysis patient.  Previous below the knee amputations.  Due for dialysis.  States she had a little bit of a cough mostly here for nausea vomiting diarrhea that has been going for last 2 weeks but on and off since September.  Also has had some vomiting.  States her weight was down for last dialysis.  Acute abdominal series did not show obstruction.  CT scan done of abdomen due to tenderness and diarrhea.  Reassuring but did show possible pneumonia.  Patient later states she been having some coughing.  Patient however is now desatting at rest.  Sats to the 80s.  Improved with nasal cannula oxygen.  No fevers.  I think with this she would benefit from admission to the hospital.  Has had some desats in the past.  Had been potentially related to volume overload 2.  Will discuss with unassigned medicine which appears to be family practice.  Covid test added.  Patient is unvaccinated. Final Clinical Impression(s) / ED Diagnoses Final diagnoses:  Nausea vomiting and diarrhea  End stage renal disease on dialysis [facility])  Community acquired pneumonia of right lung, unspecified part of lung    Rx / DC Orders ED Discharge Orders         Ordered    ondansetron (ZOFRAN-ODT) 4 MG disintegrating tablet  Every 8 hours PRN        10/17/20 1350           Davonna Belling, MD 10/17/20 1516

## 2020-10-17 NOTE — ED Notes (Signed)
Pt found to have pus coming out of belly button and some redness/pus on the lower left side. ED provider notified as well as triad hospitalist paged and notified.

## 2020-10-17 NOTE — Discharge Instructions (Addendum)
Follow-up with gastroenterology for further evaluation of your nausea vomiting diarrhea. Your CT scan was reassuring. The stool studies also were reassuring. Follow-up with dialysis tomorrow as planned.

## 2020-10-17 NOTE — ED Triage Notes (Signed)
Pt comes via [facility] for diarrhea that has been going on all week, pt also vomited x 1 today, pt is a dialysis pt MWF. Pt is a bilateral BKA.
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
## 2020-10-17 NOTE — ED Notes (Signed)
Pt desats into low 80's without o2. Dr Alvino Chapel aware and to bedside

## 2020-10-18 ENCOUNTER — Inpatient Hospital Stay (HOSPITAL_COMMUNITY): Payer: Medicare Other

## 2020-10-18 DIAGNOSIS — J9601 Acute respiratory failure with hypoxia: Secondary | ICD-10-CM | POA: Diagnosis not present

## 2020-10-18 LAB — CBG MONITORING, ED
Glucose-Capillary: 95 mg/dL (ref 70–99)
Glucose-Capillary: 109 mg/dL — ABNORMAL HIGH (ref 70–99)

## 2020-10-18 LAB — PROCALCITONIN: Procalcitonin: 0.24 ng/mL

## 2020-10-18 LAB — RENAL FUNCTION PANEL
Albumin: 3.3 g/dL — ABNORMAL LOW (ref 3.5–5.0)
Anion gap: 14 (ref 5–15)
BUN: 30 mg/dL — ABNORMAL HIGH (ref 6–20)
CO2: 27 mmol/L (ref 22–32)
Calcium: 9.2 mg/dL (ref 8.9–10.3)
Chloride: 97 mmol/L — ABNORMAL LOW (ref 98–111)
Creatinine, Ser: 10.19 mg/dL — ABNORMAL HIGH (ref 0.44–1.00)
GFR, Estimated: 4 mL/min — ABNORMAL LOW (ref >60)
Glucose, Bld: 102 mg/dL — ABNORMAL HIGH (ref 70–99)
Phosphorus: 6.5 mg/dL — ABNORMAL HIGH (ref 2.5–4.6)
Potassium: 3.9 mmol/L (ref 3.5–5.1)
Sodium: 138 mmol/L (ref 135–145)

## 2020-10-18 LAB — GLUCOSE, CAPILLARY: Glucose-Capillary: 114 mg/dL — ABNORMAL HIGH (ref 70–99)

## 2020-10-18 LAB — CBC
HCT: 34.2 % — ABNORMAL LOW (ref 36.0–46.0)
Hemoglobin: 10.1 g/dL — ABNORMAL LOW (ref 12.0–15.0)
MCH: 25.6 pg — ABNORMAL LOW (ref 26.0–34.0)
MCHC: 29.5 g/dL — ABNORMAL LOW (ref 30.0–36.0)
MCV: 86.6 fL (ref 80.0–100.0)
Platelets: 94 10*3/uL — ABNORMAL LOW (ref 150–400)
RBC: 3.95 MIL/uL (ref 3.87–5.11)
RDW: 20.1 % — ABNORMAL HIGH (ref 11.5–15.5)
WBC: 5 10*3/uL (ref 4.0–10.5)
nRBC: 0 % (ref 0.0–0.2)

## 2020-10-18 LAB — HEMOGLOBIN A1C
Hgb A1c MFr Bld: 5.3 % (ref 4.8–5.6)
Mean Plasma Glucose: 105.41 mg/dL

## 2020-10-18 LAB — BRAIN NATRIURETIC PEPTIDE: B Natriuretic Peptide: 2154.8 pg/mL — ABNORMAL HIGH (ref 0.0–100.0)

## 2020-10-18 MED ORDER — DOXERCALCIFEROL 4 MCG/2ML IV SOLN
4.0000 ug | INTRAVENOUS | Status: DC
  Administered 2020-10-24: 4 ug via INTRAVENOUS
  Filled 2020-10-18 (×2): qty 2

## 2020-10-18 MED ORDER — SEVELAMER CARBONATE 800 MG PO TABS
2400.0000 mg | ORAL_TABLET | Freq: Three times a day (TID) | ORAL | Status: DC
  Administered 2020-10-19 – 2020-10-24 (×10): 2400 mg via ORAL
  Filled 2020-10-18 (×13): qty 3

## 2020-10-18 MED ORDER — DARBEPOETIN ALFA 40 MCG/0.4ML IJ SOSY
40.0000 ug | PREFILLED_SYRINGE | Freq: Once | INTRAMUSCULAR | Status: AC
  Administered 2020-10-18: 40 ug via SUBCUTANEOUS
  Filled 2020-10-18: qty 0.4

## 2020-10-18 MED ORDER — NEPRO/CARBSTEADY PO LIQD
237.0000 mL | Freq: Two times a day (BID) | ORAL | Status: DC
  Administered 2020-10-20 – 2020-10-23 (×5): 237 mL via ORAL

## 2020-10-18 MED ORDER — CHLORHEXIDINE GLUCONATE CLOTH 2 % EX PADS
6.0000 | MEDICATED_PAD | Freq: Every day | CUTANEOUS | Status: DC
  Administered 2020-10-19 – 2020-10-24 (×6): 6 via TOPICAL

## 2020-10-18 NOTE — ED Notes (Signed)
Hospital Bed ordered @ 0739-per RN ordered by Levada Dy

## 2020-10-18 NOTE — ED Notes (Signed)
Pt pulled off all cables and monitor and refused to keep monitor on.

## 2020-10-18 NOTE — ED Notes (Signed)
CBG 109. Notified Hannie, RN.

## 2020-10-18 NOTE — Progress Notes (Signed)
Modified Barium Swallow Progress Note  Patient Details  Name: Karen [name] MRN: [id number] Date of Birth: Feb 01, 1976  Today's Date: 10/18/2020  Modified Barium Swallow completed.  Full report located under Chart Review in the Imaging Section.  Brief recommendations include the following:  Clinical Impression  Pt demonstrates variable ability to time and execute oral and pharyngeal movements during swallowing, at worst leading to trace frank aspiration before the swallow. Oral phase is characterized by slow oral manipulation of bolus, with increased time for posterior propulsion if pt is given a motor command to execute.  Primarily problem is premature spillage causing trace silent penetration. A verbal cue to orally hold the bolus prior to starting the swallow slows down the process, but decreases instance of penetration. Boluses may reach the pharynx with timely laryngeal protection, or occasionally spill and pool in the pyriforms prior to swallow initiation. Overall, pts function is stable, we discussed compensatory strategies, pt to continue current diet. No acute SLP f/u needed, pt would benefit from f/u with [facility] SLP.    Swallow Evaluation Recommendations       SLP Diet Recommendations: Regular solids;Thin liquid   Liquid Administration via: Cup;Straw                   Oral Care Recommendations: Oral care BID       Herbie [place], MA [place] Pager [phone] Office [phone]  Lynann Beaver 10/18/2020,12:01 PM

## 2020-10-18 NOTE — ED Notes (Addendum)
Pt transported to radiology.

## 2020-10-18 NOTE — ED Notes (Signed)
Patient self removed cardiac, bp, and pulse ox monitoring at this time. Patient agreeable to wear oxygen. Refusing to be monitored.

## 2020-10-18 NOTE — ED Notes (Signed)
Pt refused to be placed on cardiac monitor. 

## 2020-10-18 NOTE — Consult Note (Addendum)
 [facility] Renal Consultation Note    Indication for Consultation:  Management of ESRD/hemodialysis; anemia, hypertension/volume and secondary hyperparathyroidism PCP: No PCP  HPI: Karen [name] is a 45 y.o. female  hemodialysis MWF at [facility]. PMH: HTN, CVA, DMT2, obesity, PAD S/P bilateral BKA, ischemic cardiomyopathy,anemia of ESRD, SHPT,  noncompliance with HD. Truncated two HD treatments 10/11/20 and 10/14/20 stayed 2:52  of 4 hour treatments. She did arrive under OP EDW 10/14/2020, was not challenged, left 2.4 kg under OP EDW. Missed HD 10/16/2020.  She presented to ED 10/17/2020 with complaints of diarrhea. She was noted to have low O2 sats on RA which improved with O2 via [place]. C Diff and GI panel negative. CXR revealed Vascular congestion with mild interstitial edema. Small right effusion. CT of Abdomen/Pelvis showed focus of pneumonia in the posterior right base. There is apparent degree of interstitial edema in the lung bases. Labs: WBC 4.8 HGB 10.6 PLT 89 Na 141 K+ 3.5 Co2 27 K+ 3.5 SCr 9.52 BUN 23. She has been admitted with acute respiratory failure with hypoxia, mixed components of volume overload and RL PNA. We have been asked to manage HD.   Seen in room on RA. Says she "has pneumonia and doesn't need any oxygen". Also refusing to wear cardiac monitor. She denies SOB, says she has not had diarrhea since admission. She has no C/Os.    Past Medical History:  Diagnosis Date  . Anemia   . Anxiety   . Eczema   . ESRD (end stage renal disease) ([place])    Hemo - TTHSAT- [place]  . Exertional shortness of breath   . GERD (gastroesophageal reflux disease)   . Headache   . History of blood transfusion   . History of kidney stones    passed  . Hypertension   . Ischemic cardiomyopathy    by echo 2014  . Neuropathy   . Pneumonia 09/2016  . Type II diabetes mellitus ([place]) 1995   Past Surgical History:  Procedure Laterality Date  . ABDOMINAL AORTOGRAM  W/LOWER EXTREMITY Right 04/03/2020   Procedure: ABDOMINAL AORTOGRAM W/LOWER EXTREMITY;  Surgeon: Marty Heck, MD;  Location: [place] [facility];  Service: Cardiovascular;  Laterality: Right;  . AMPUTATION Left 02/06/2013   Procedure: AMPUTATION LEFT GREAT TOE;  Surgeon: Wylene Simmer, MD;  Location: [place];  Service: Orthopedics;  Laterality: Left;  . AMPUTATION Left 06/24/2013   Procedure: AMPUTATION BELOW KNEE ;  Surgeon: Wylene Simmer, MD;  Location: [place];  Service: Orthopedics;  Laterality: Left;  . AMPUTATION Right 04/10/2020   Procedure: RIGHT AMPUTATION BELOW KNEE;  Surgeon: Waynetta Sandy, MD;  Location: [place];  Service: Vascular;  Laterality: Right;  . AV FISTULA PLACEMENT Right 02/08/2017   Procedure: CREATION OF RIGHT ARM  BASILIC VEIN TO BRACHIAL ARTERY ARTERIOVENOUS (AV) FISTULA;  Surgeon: Rosetta Posner, MD;  Location: [place] OR;  Service: Vascular;  Laterality: Right;  . AV FISTULA PLACEMENT Left 09/10/2017   Procedure: INSERTION OF ARTERIOVENOUS (AV) GORE-TEX GRAFT  LEFT UPPER ARM;  Surgeon: Angelia Mould, MD;  Location: [place];  Service: Vascular;  Laterality: Left;  . BASCILIC VEIN TRANSPOSITION Right 04/14/2017   Procedure: BASCILIC VEIN TRANSPOSITION-RIGHT 2ND STAGE;  Surgeon: Rosetta Posner, MD;  Location: [place] ;  Service: Vascular;  Laterality: Right;  . CESAREAN SECTION  10/18/2006  . INSERTION OF DIALYSIS CATHETER    . PERIPHERAL VASCULAR BALLOON ANGIOPLASTY Right 04/03/2020   Procedure: PERIPHERAL VASCULAR BALLOON ANGIOPLASTY;  Surgeon: Marty Heck, MD;  Location: [place] [facility];  Service: Cardiovascular;  Laterality: Right;  SFA  . WOUND DEBRIDEMENT Right 04/04/2020   Procedure: RIGHT FOOT DEBRIDEMENT;  Surgeon: Waynetta Sandy, MD;  Location: [place];  Service: Vascular;  Laterality: Right;   Family History  Problem Relation Age of Onset  . Hypertension Mother   . Diabetes Mother   . Diabetes Father    Social History:  reports that  she quit smoking about 16 years ago. Her smoking use included cigarettes. She has a 30.00 pack-year smoking history. She has never used smokeless tobacco. She reports that she does not drink alcohol and does not use drugs. Allergies  Allergen Reactions  . Baclofen Itching  . Penicillins Anaphylaxis, Hives, Rash and Other (See Comments)    PATIENT HAD A PCN REACTION WITH IMMEDIATE RASH, FACIAL/TONGUE/THROAT SWELLING, SOB, OR LIGHTHEADEDNESS WITH HYPOTENSION:  #  #  #  YES  #  #  #   SEVERE RASH INVOLVING MUCUS MEMBRANES or SKIN NECROSIS: #  #  #  YES  #  #  # Has patient had a PCN reaction that required hospitalization No Has patient had a PCN reaction occurring within the last 10 years: No If all of the above answers are "NO", then may proceed with Cephalosporin use.  09/10/16- tolerated Cefepime   . Morphine And Related Hives and Rash  . Novolog [Insulin Aspart] Other (See Comments)    Cramps/ Gi distress   Prior to Admission medications   Medication Sig Start Date End Date Taking? Authorizing Provider  amLODipine (NORVASC) 10 MG tablet Take 10 mg by mouth daily.   Yes [provider]  aspirin EC 81 MG tablet Take 81 mg by mouth daily. Swallow whole.   Yes [provider]  atorvastatin (LIPITOR) 40 MG tablet Take 40 mg by mouth daily.  06/08/19  Yes [provider]  calcitRIOL (ROCALTROL) 0.5 MCG capsule Take 3 capsules (1.5 mcg total) by mouth Every Tuesday,Thursday,and Saturday with dialysis. Patient taking differently: Take 1.5 mcg by mouth every Monday, Wednesday, and Friday. 11/13/17  Yes Debbe Odea, MD  calcium carbonate (OS-CAL) 1250 (500 Ca) MG chewable tablet Chew 1 tablet by mouth 3 (three) times daily.  01/14/16  Yes [provider]  cinacalcet (SENSIPAR) 30 MG tablet Take 1 tablet (30 mg total) by mouth Every Tuesday,Thursday,and Saturday with dialysis. Patient taking differently: Take 30 mg by mouth every Monday, Wednesday, and Friday with  hemodialysis. 11/13/17  Yes Debbe Odea, MD  clonazePAM (KLONOPIN) 1 MG tablet Take 1 mg by mouth 2 (two) times daily as needed for anxiety.   Yes [provider]  clopidogrel (PLAVIX) 75 MG tablet Take 1 tablet (75 mg total) by mouth daily with breakfast. 04/17/20  Yes Rhyne, Samantha J, PA-C  Darbepoetin Alfa (ARANESP) 200 MCG/0.4ML SOSY injection Inject 0.4 mLs (200 mcg total) into the vein every Thursday with hemodialysis. 02/03/18  Yes Bonnita Hollow, MD  furosemide (LASIX) 20 MG tablet Take 20 mg by mouth daily. 10/02/20  Yes [provider]  insulin glargine (LANTUS) 100 UNIT/ML injection Inject 9 Units into the skin at bedtime. 01/14/16  Yes [provider]  lidocaine-prilocaine (EMLA) cream Apply 1 application topically daily as needed (pain). 05/02/19  Yes [provider]  metoprolol succinate (TOPROL-XL) 25 MG 24 hr tablet Take 0.5 tablets (12.5 mg total) by mouth daily. 07/10/18 02/20/28 Yes Bonnielee Haff, MD  multivitamin (RENA-VIT) TABS tablet Take 1 tablet  by mouth at bedtime. 11/13/17  Yes Debbe Odea, MD  naloxone [facility]) 4 MG/0.1ML LIQD nasal spray kit Place 0.4 mg into the nose once. Repeat after 3 minutes if no or minimal response 01/18/20  Yes [provider]  ondansetron (ZOFRAN-ODT) 4 MG disintegrating tablet Take 1 tablet (4 mg total) by mouth every 8 (eight) hours as needed for nausea or vomiting. 10/17/20  Yes Davonna Belling, MD  pantoprazole (PROTONIX) 20 MG tablet TAKE 1 TABLET(20 MG) BY MOUTH EVERY DAY Patient taking differently: Take by mouth daily before breakfast. 02/06/20  Yes Lillard Anes, MD  AMITIZA 24 MCG capsule Take 24 mcg by mouth every 12 (twelve) hours as needed for constipation. 05/22/20   [provider]  hydrALAZINE (APRESOLINE) 25 MG tablet Take 25 mg by mouth daily. 06/08/19   [provider]  NOVOFINE 32G X 6 MM MISC  02/01/20   [provider]  oxyCODONE (OXY IR/ROXICODONE)  5 MG immediate release tablet Take 1 tablet (5 mg total) by mouth every 4 (four) hours as needed for moderate pain or severe pain. Patient not taking: No sig reported 04/17/20   Gabriel Earing, PA-C   Current Facility-Administered Medications  Medication Dose Route Frequency Provider Last Rate Last Admin  . acetaminophen (TYLENOL) tablet 650 mg  650 mg Oral Q6H PRN Norval Morton, MD       Or  . acetaminophen (TYLENOL) suppository 650 mg  650 mg Rectal Q6H PRN Smith, Rondell A, MD      . albuterol (PROVENTIL) (2.5 MG/3ML) 0.083% nebulizer solution 2.5 mg  2.5 mg Nebulization Q6H PRN Smith, Rondell A, MD      . amLODipine (NORVASC) tablet 10 mg  10 mg Oral Daily Fuller Plan A, MD   10 mg at 10/18/20 6269  . atorvastatin (LIPITOR) tablet 40 mg  40 mg Oral Daily Fuller Plan A, MD   40 mg at 10/18/20 0926  . azithromycin (ZITHROMAX) 500 mg in sodium chloride 0.9 % 250 mL IVPB  500 mg Intravenous Q24H Norval Morton, MD   Held at 10/18/20 0350  . calcitRIOL (ROCALTROL) capsule 1.5 mcg  1.5 mcg Oral Q M,W,F Smith, Rondell A, MD      . cefTRIAXone (ROCEPHIN) 2 g in sodium chloride 0.9 % 100 mL IVPB  2 g Intravenous Q24H Norval Morton, MD   Stopped at 10/17/20 2341  . cinacalcet (SENSIPAR) tablet 30 mg  30 mg Oral Q M,W,F-HD Tamala Julian, Rondell A, MD      . clopidogrel (PLAVIX) tablet 75 mg  75 mg Oral Q breakfast Fuller Plan A, MD   75 mg at 10/17/20 2136  . furosemide (LASIX) tablet 20 mg  20 mg Oral Daily Fuller Plan A, MD   20 mg at 10/18/20 4854  . hydrALAZINE (APRESOLINE) injection 10 mg  10 mg Intravenous Q4H PRN Smith, Rondell A, MD      . insulin aspart (novoLOG) injection 0-6 Units  0-6 Units Subcutaneous TID WC Smith, Rondell A, MD      . lidocaine-prilocaine (EMLA) cream 1 application  1 application Topical Daily PRN Smith, Rondell A, MD      . loperamide (IMODIUM) capsule 2 mg  2 mg Oral PRN Tamala Julian, Rondell A, MD      . metoprolol succinate (TOPROL-XL) 24 hr tablet 12.5 mg   12.5 mg Oral Daily Tamala Julian, Rondell A, MD   12.5 mg at 10/18/20 6270  . multivitamin (RENA-VIT) tablet 1 tablet  1 tablet  Oral QHS Smith, Rondell A, MD      . ondansetron (ZOFRAN) tablet 4 mg  4 mg Oral Q6H PRN Fuller Plan A, MD       Or  . ondansetron (ZOFRAN) injection 4 mg  4 mg Intravenous Q6H PRN Smith, Rondell A, MD      . pantoprazole (PROTONIX) EC tablet 40 mg  40 mg Oral QAC breakfast Fuller Plan A, MD   40 mg at 10/18/20 0925  . sodium chloride flush (NS) 0.9 % injection 3 mL  3 mL Intravenous Q12H Fuller Plan A, MD   3 mL at 10/18/20 0921   Labs: Basic Metabolic Panel: Recent Labs  Lab 10/17/20 0653 10/18/20 0540  NA 141 138  K 3.5 3.9  CL 98 97*  CO2 27 27  GLUCOSE 110* 102*  BUN 23* 30*  CREATININE 9.52* 10.19*  CALCIUM 9.6 9.2  PHOS  --  6.5*   Liver Function Tests: Recent Labs  Lab 10/17/20 0653 10/18/20 0540  AST 14*  --   ALT 9  --   ALKPHOS 85  --   BILITOT 0.8  --   PROT 7.3  --   ALBUMIN 3.5 3.3*   Recent Labs  Lab 10/17/20 0653  LIPASE 16   No results for input(s): AMMONIA in the last 168 hours. CBC: Recent Labs  Lab 10/17/20 0653 10/18/20 0540  WBC 4.8 5.0  HGB 10.6* 10.1*  HCT 35.8* 34.2*  MCV 87.1 86.6  PLT 89* 94*   Cardiac Enzymes: No results for input(s): CKTOTAL, CKMB, CKMBINDEX, TROPONINI in the last 168 hours. CBG: Recent Labs  Lab 10/18/20 0733 10/18/20 1217  GLUCAP 95 109*   Iron Studies: No results for input(s): IRON, TIBC, TRANSFERRIN, FERRITIN in the last 72 hours. Studies/Results: CT ABDOMEN PELVIS W CONTRAST  Result Date: 10/17/2020 CLINICAL DATA:  Diarrhea and vomiting.  Chronic renal failure EXAM: CT ABDOMEN AND PELVIS WITH CONTRAST TECHNIQUE: Multidetector CT imaging of the abdomen and pelvis was performed using the standard protocol following bolus administration of intravenous contrast. CONTRAST:  132mL OMNIPAQUE IOHEXOL 300 MG/ML  SOLN COMPARISON:  CT abdomen and pelvis July 19, 2020 FINDINGS: Lower  chest: There is airspace opacity in the posterior right base consistent with pneumonia. There is mild bibasilar interstitial pulmonary edema. There is cardiomegaly with multiple foci of coronary artery calcification. Hepatobiliary: Liver measures 23.0 cm in length. There is decreased attenuation in the liver, likely due to hepatic steatosis. Liver contour is subtly nodular. No focal liver lesions are evident. Gallbladder is contracted. There is no appreciable biliary duct dilatation. Pancreas: There is no pancreatic mass or inflammatory focus. Spleen: No splenic lesions are evident. Adrenals/Urinary Tract: Adrenals bilaterally appear normal. No evident renal mass. No hydronephrosis on either side. There is extensive peripheral renal artery calcification. No evident renal or ureteral calculus. Urinary bladder is midline with wall thickness borderline increased. Stomach/Bowel: There is no appreciable bowel wall or mesenteric thickening. No evident bowel obstruction. The terminal ileum appears normal. There is no free air or portal venous air. Vascular/Lymphatic: No abdominal aortic aneurysm. There is aortic and pelvic arterial vascular calcification. Calcification is also noted in peripheral renal arteries as well as in portions of the hepatic artery. No adenopathy is appreciable in the abdomen or pelvis. Reproductive: Uterus is anteverted. Periuterine arterial vascular calcification noted bilaterally. No adnexal masses are evident. Other: Appendix appears normal. There is no evident abscess or ascites in the abdomen pelvis. There is mild edema in the abdominal  wall, a likely degree of mild anasarca. There is mild fat in the umbilicus. Musculoskeletal: No blastic or lytic bone lesions. Foci of degenerative change noted in the lumbar spine. No intramuscular lesions are evident. IMPRESSION: 1. Apparent focus of pneumonia in the posterior right base. There is apparent degree of interstitial edema in the lung bases. 2.  Enlarged liver with subtle nodular contour of the liver suggesting underlying cirrhosis. No focal liver lesions are appreciable. 3. No bowel obstruction. No abscess in the abdomen or pelvis. Appendix appears normal. 4. Aortic Atherosclerosis (ICD10-I70.0). Extensive arterial and pelvic vascular calcification throughout the abdomen and pelvis. Foci of coronary artery calcification noted. 5.  Mild anasarca in the abdominal wall. Electronically Signed   By: Lowella Grip III M.D.   On: 10/17/2020 12:01   DG Abdomen Acute W/Chest  Result Date: 10/17/2020 CLINICAL DATA:  Abdominal pain, diarrhea.  Dialysis patient EXAM: DG ABDOMEN ACUTE WITH 1 VIEW CHEST COMPARISON:  Chest 10/02/2020 FINDINGS: Cardiac enlargement. Vascular congestion with mild interstitial edema. Small right effusion. Normal bowel gas pattern. No obstruction or free air. No renal calculi. Diffuse arterial calcification. IMPRESSION: Vascular congestion with mild interstitial edema and small right effusion Normal bowel gas pattern. Electronically Signed   By: Franchot Gallo M.D.   On: 10/17/2020 08:06   DG Swallowing Func-Speech Pathology  Result Date: 10/18/2020 Objective Swallowing Evaluation: Type of Study: MBS-Modified Barium Swallow Study  Patient Details Name: Karen [name] MRN: [id number] Date of Birth: 1976/06/18 Today's Date: 10/18/2020 Time: SLP Start Time (ACUTE ONLY): 1045 -SLP Stop Time (ACUTE ONLY): 1100 SLP Time Calculation (min) (ACUTE ONLY): 15 min Past Medical History: Past Medical History: Diagnosis Date . Anemia  . Anxiety  . Eczema  . ESRD (end stage renal disease) ([place])   Hemo - TTHSAT- [place] . Exertional shortness of breath  . GERD (gastroesophageal reflux disease)  . Headache  . History of blood transfusion  . History of kidney stones   passed . Hypertension  . Ischemic cardiomyopathy   by echo 2014 . Neuropathy  . Pneumonia 09/2016 . Type II diabetes mellitus ([place]) 1995 Past Surgical History: Past Surgical History:  Procedure Laterality Date . ABDOMINAL AORTOGRAM W/LOWER EXTREMITY Right 04/03/2020  Procedure: ABDOMINAL AORTOGRAM W/LOWER EXTREMITY;  Surgeon: Marty Heck, MD;  Location: [place] [facility];  Service: Cardiovascular;  Laterality: Right; . AMPUTATION Left 02/06/2013  Procedure: AMPUTATION LEFT GREAT TOE;  Surgeon: Wylene Simmer, MD;  Location: [place];  Service: Orthopedics;  Laterality: Left; . AMPUTATION Left 06/24/2013  Procedure: AMPUTATION BELOW KNEE ;  Surgeon: Wylene Simmer, MD;  Location: [place];  Service: Orthopedics;  Laterality: Left; . AMPUTATION Right 04/10/2020  Procedure: RIGHT AMPUTATION BELOW KNEE;  Surgeon: Waynetta Sandy, MD;  Location: [place];  Service: Vascular;  Laterality: Right; . AV FISTULA PLACEMENT Right 02/08/2017  Procedure: CREATION OF RIGHT ARM  BASILIC VEIN TO BRACHIAL ARTERY ARTERIOVENOUS (AV) FISTULA;  Surgeon: Rosetta Posner, MD;  Location: [place] [facility];  Service: Vascular;  Laterality: Right; . AV FISTULA PLACEMENT Left 09/10/2017  Procedure: INSERTION OF ARTERIOVENOUS (AV) GORE-TEX GRAFT  LEFT UPPER ARM;  Surgeon: Angelia Mould, MD;  Location: [place];  Service: Vascular;  Laterality: Left; . BASCILIC VEIN TRANSPOSITION Right 04/14/2017  Procedure: BASCILIC VEIN TRANSPOSITION-RIGHT 2ND STAGE;  Surgeon: Rosetta Posner, MD;  Location: [place];  Service: Vascular;  Laterality: Right; . CESAREAN SECTION  10/18/2006 . INSERTION OF DIALYSIS CATHETER   . PERIPHERAL VASCULAR BALLOON ANGIOPLASTY Right 04/03/2020  Procedure:  PERIPHERAL VASCULAR BALLOON ANGIOPLASTY;  Surgeon: Marty Heck, MD;  Location: [place] [facility];  Service: Cardiovascular;  Laterality: Right;  SFA . WOUND DEBRIDEMENT Right 04/04/2020  Procedure: RIGHT FOOT DEBRIDEMENT;  Surgeon: Waynetta Sandy, MD;  Location: [place];  Service: Vascular;  Laterality: Right; HPI: 45 y.o. female,  with a past medical history significant for ESRD on dialysis (T, Th, S), HTN, stroke,T2DM, PAD s/p left BKA, ischemic  cardiomyopathy presenting with diarrhea. CT scan of the abdomen and pelvis with contrast significant for focus of pneumonia in the right posterior lung base and degree of interstitial edema in the lungs. Pt reports concern about aspiration. Pt followed by SLP services during Dec 2018 admission for CVA (infarct left pons, right parietal lobe, right corpus callosum/cingulate gyrus) - was found to have a mild dysphagia/dysarthria.  She reports increased episodes of choking with meals PTA. Last MBS completed on 01/28/2018 showing sensed peentration of thin liquids. Pt has ongoing spastic dysarthria and hopes for improvement, she reports she had an SLP working with her at home as recently as 6 months ago.  No data recorded Assessment / Plan / Recommendation CHL IP CLINICAL IMPRESSIONS 10/18/2020 Clinical Impression Pt demonstrates variable ability to time and execute oral and pharyngeal movements during swallowing, at worst leading to trace frank aspiration before the swallow. Oral phase is characterized by slow oral manipulation of bolus, with increased time for posterior propulsion if pt is given a motor command to execute.  Primarily problem is premature spillage causing trace silent penetration. A verbal cue to orally hold the bolus prior to starting the swallow slows down the process, but decreases instance of penetration. Boluses may reach the pharynx with timely laryngeal protection, or occasionally spill and pool in the pyriforms prior to swallow initiation. Overall, pts function is stable, we discussed compensatory strategies, pt to continue current diet. No acute SLP f/u needed, pt would benefit from f/u with [facility] SLP.  SLP Visit Diagnosis Dysphagia, oropharyngeal phase (R13.12) Attention and concentration deficit following -- Frontal lobe and executive function deficit following -- Impact on safety and function Mild aspiration risk   CHL IP TREATMENT RECOMMENDATION 10/18/2020 Treatment Recommendations No treatment  recommended at this time   Prognosis 09/11/2017 Prognosis for Safe Diet Advancement Good Barriers to Reach Goals Cognitive deficits Barriers/Prognosis Comment -- CHL IP DIET RECOMMENDATION 10/18/2020 SLP Diet Recommendations Regular solids;Thin liquid Liquid Administration via Cup;Straw Medication Administration -- Compensations -- Postural Changes --   CHL IP OTHER RECOMMENDATIONS 10/18/2020 Recommended Consults -- Oral Care Recommendations Oral care BID Other Recommendations --   CHL IP FOLLOW UP RECOMMENDATIONS 10/18/2020 Follow up Recommendations 24 hour supervision/assistance;Home health SLP   CHL IP FREQUENCY AND DURATION 04/11/2020 Speech Therapy Frequency (ACUTE ONLY) min 2x/week Treatment Duration --      CHL IP ORAL PHASE 10/18/2020 Oral Phase Impaired Oral - Pudding Teaspoon -- Oral - Pudding Cup -- Oral - Honey Teaspoon -- Oral - Honey Cup -- Oral - Nectar Teaspoon -- Oral - Nectar Cup -- Oral - Nectar Straw -- Oral - Thin Teaspoon -- Oral - Thin Cup Premature spillage;Delayed oral transit;Holding of bolus Oral - Thin Straw Premature spillage;Delayed oral transit;Holding of bolus Oral - Puree Delayed oral transit;Holding of bolus Oral - Mech Soft -- Oral - Regular Delayed oral transit;Holding of bolus Oral - Multi-Consistency -- Oral - Pill Premature spillage;Delayed oral transit;Holding of bolus Oral Phase - Comment --  CHL IP PHARYNGEAL PHASE 10/18/2020 Pharyngeal Phase Impaired Pharyngeal- Pudding Teaspoon --  Pharyngeal -- Pharyngeal- Pudding Cup -- Pharyngeal -- Pharyngeal- Honey Teaspoon -- Pharyngeal -- Pharyngeal- Honey Cup -- Pharyngeal -- Pharyngeal- Nectar Teaspoon -- Pharyngeal -- Pharyngeal- Nectar Cup -- Pharyngeal -- Pharyngeal- Nectar Straw -- Pharyngeal -- Pharyngeal- Thin Teaspoon -- Pharyngeal -- Pharyngeal- Thin Cup Delayed swallow initiation-vallecula;Delayed swallow initiation-pyriform sinuses;Penetration/Aspiration before swallow Pharyngeal Material enters airway, CONTACTS cords and not  ejected out;Material does not enter airway Pharyngeal- Thin Straw Delayed swallow initiation-pyriform sinuses;Delayed swallow initiation-vallecula;Penetration/Aspiration before swallow Pharyngeal Material enters airway, CONTACTS cords and not ejected out;Material does not enter airway Pharyngeal- Puree Delayed swallow initiation-vallecula;Delayed swallow initiation-pyriform sinuses Pharyngeal -- Pharyngeal- Mechanical Soft Delayed swallow initiation-vallecula;Delayed swallow initiation-pyriform sinuses Pharyngeal -- Pharyngeal- Regular -- Pharyngeal -- Pharyngeal- Multi-consistency -- Pharyngeal -- Pharyngeal- Pill -- Pharyngeal -- Pharyngeal Comment --  CHL IP CERVICAL ESOPHAGEAL PHASE 01/28/2018 Cervical Esophageal Phase WFL Pudding Teaspoon -- Pudding Cup -- Honey Teaspoon -- Honey Cup -- Nectar Teaspoon -- Nectar Cup -- Nectar Straw -- Thin Teaspoon -- Thin Cup -- Thin Straw -- Puree -- Mechanical Soft -- Regular -- Multi-consistency -- Pill -- Cervical Esophageal Comment -- Herbie Baltimore, MA CCC-SLP Acute Rehabilitation Services Pager [phone] Office [phone] Lynann Beaver 10/18/2020, 12:06 PM               ROS: As per HPI otherwise negative.  Physical Exam: Vitals:   10/18/20 0726 10/18/20 0919 10/18/20 1231 10/18/20 1316  BP: (!) 168/102 (!) 160/89 126/79 (!) 143/85  Pulse: 78 78 73 76  Resp: _0 Temp: 97.7 F (36.5 C)   98.9 F (37.2 C)  TempSrc: Oral     SpO2: 100% 96% 98% 91%     General: Chronically ill appearing female on RA in NAD Head: Normocephalic, atraumatic, sclera non-icteric, mucus membranes are moist Neck: Supple. JVD 1/4 to mandible. . Lungs: Bilateral breath sounds with scattered bibasilar crackles posteriorly. Very few coarse rhonchi upper airway.  Breathing is unlabored.  Heart: RRR with S1 S2. No murmurs, rubs, or gallops appreciated. Abdomen: Soft, non-tender, non-distended with normoactive bowel sounds. No rebound/guarding. No obvious  abdominal masses. Possible lower abdominal wall edema.  M-S:  Strength and tone appear normal for age. Lower extremities:Bilateral BKAs no stump edema Neuro: Alert and oriented X 3. Moves all extremities spontaneously. Psych:  Responds to questions appropriately with a normal affect. Dialysis Access: L AVG + T/B  Dialysis Orders: Dialysis Orders:[place] MWF 4 hrs 180NRe 400/800 94 kg 2.0 K/ 2.0 Ca UFP 2 L AVG -Heparin 3000 units IV TIW -Hectorol 4 mcg IV TIW -Mircera 150 mcg IV q 2 weeks (Last dose 10/04/2020) -Venofer 100 mg IV X 5 doses 1/5 doses given  Assessment/Plan: 1. Volume overload: She appears to have lost body weight, missed and truncated treatments. She is currently on RA. No urgent HD needs today. Will have HD in AM D/T staffing shortage/high census. Lower volume as tolerated. BNP not a reliable marker in ESRD. 2. Acute hypoxic respiratory failure: Believe primary issue is volume overload D/T truncated and missed HD. She is currently comfortable on RA. Also being treated for PNA per primary.  3. Aspiration PNA. WBC 5.0, afebrile. ABX per primary. COVID negative.  4.  ESRD -  MWF, last HD 01/24-signed off early. She missed HD 10/16/20. HD tomorrow. Use 2.0 K bath. Usual heparin.  5.  Hypertension/volume  -Volume as noted above. No overt volume excess by exam. Hypertensive on admission. Resumed amlodipine and metoprolol. BP more stable. Dc'd oral furosemide 20 mg PO  as she doesn't make urine and dose most likely too low to be effective.  6.  Anemia  - HGB 10.1 Next dose due today. Will give Aranesp 40 mcg IV with HD tomorrow.  7.  Metabolic bone disease -  C Ca 9.76 PO4 elevated. Chronic issue with noncompliance with binders. Continue binders/VDRA.  8.  Nutrition - Albumin 3.3. Renal/Carb mod diet with nepro, renal vit. 9.  DM-per primary 10.  H/O CVA. Speech study today. No treatment recommended.   Pelagia Iacobucci H. Owens Shark, NP-C 10/18/2020, 3:59 PM  [employer]  [phone]

## 2020-10-18 NOTE — ED Notes (Signed)
Pt refusing EKG

## 2020-10-18 NOTE — Progress Notes (Signed)
Attempted to place patient on cardiac monitor but she is refusing at this time.

## 2020-10-18 NOTE — Evaluation (Signed)
Clinical/Bedside Swallow Evaluation Patient Details  Name: Karen [name] MRN: [id number] Date of Birth: 1976-03-15  Today's Date: 10/18/2020 Time: SLP Start Time (ACUTE ONLY): 0930 SLP Stop Time (ACUTE ONLY): 0950 SLP Time Calculation (min) (ACUTE ONLY): 20 min  Past Medical History:  Past Medical History:  Diagnosis Date  . Anemia   . Anxiety   . Eczema   . ESRD (end stage renal disease) ([place])    [facility] - [facility]- [facility]  . Exertional shortness of breath   . GERD (gastroesophageal reflux disease)   . Headache   . History of blood transfusion   . History of kidney stones    passed  . Hypertension   . Ischemic cardiomyopathy    by echo 2014  . Neuropathy   . Pneumonia 09/2016  . Type II diabetes mellitus ([place]) 1995   Past Surgical History:  Past Surgical History:  Procedure Laterality Date  . ABDOMINAL AORTOGRAM W/LOWER EXTREMITY Right 04/03/2020   Procedure: ABDOMINAL AORTOGRAM W/LOWER EXTREMITY;  Surgeon: Marty Heck, MD;  Location: [facility];  Service: Cardiovascular;  Laterality: Right;  . AMPUTATION Left 02/06/2013   Procedure: AMPUTATION LEFT GREAT TOE;  Surgeon: Wylene Simmer, MD;  Location: [facility];  Service: Orthopedics;  Laterality: Left;  . AMPUTATION Left 06/24/2013   Procedure: AMPUTATION BELOW KNEE ;  Surgeon: Wylene Simmer, MD;  Location: [facility];  Service: Orthopedics;  Laterality: Left;  . AMPUTATION Right 04/10/2020   Procedure: RIGHT AMPUTATION BELOW KNEE;  Surgeon: Waynetta Sandy, MD;  Location: [facility];  Service: Vascular;  Laterality: Right;  . AV FISTULA PLACEMENT Right 02/08/2017   Procedure: CREATION OF RIGHT ARM  BASILIC VEIN TO BRACHIAL ARTERY ARTERIOVENOUS (AV) FISTULA;  Surgeon: Rosetta Posner, MD;  Location: [facility];  Service: Vascular;  Laterality: Right;  . AV FISTULA PLACEMENT Left 09/10/2017   Procedure: INSERTION OF ARTERIOVENOUS (AV) GORE-TEX GRAFT  LEFT UPPER ARM;  Surgeon: Angelia Mould, MD;  Location: [facility];  Service:  Vascular;  Laterality: Left;  . BASCILIC VEIN TRANSPOSITION Right 04/14/2017   Procedure: BASCILIC VEIN TRANSPOSITION-RIGHT 2ND STAGE;  Surgeon: Rosetta Posner, MD;  Location: [facility];  Service: Vascular;  Laterality: Right;  . CESAREAN SECTION  10/18/2006  . INSERTION OF DIALYSIS CATHETER    . PERIPHERAL VASCULAR BALLOON ANGIOPLASTY Right 04/03/2020   Procedure: PERIPHERAL VASCULAR BALLOON ANGIOPLASTY;  Surgeon: Marty Heck, MD;  Location: [facility];  Service: Cardiovascular;  Laterality: Right;  SFA  . WOUND DEBRIDEMENT Right 04/04/2020   Procedure: RIGHT FOOT DEBRIDEMENT;  Surgeon: Waynetta Sandy, MD;  Location: [facility];  Service: Vascular;  Laterality: Right;   HPI:  45 y.o. female,  with a past medical history significant for ESRD on dialysis (T, Th, S), HTN, stroke,T2DM, PAD s/p left BKA, ischemic cardiomyopathy presenting with diarrhea. CT scan of the abdomen and pelvis with contrast significant for focus of pneumonia in the right posterior lung base and degree of interstitial edema in the lungs. Pt reports concern about aspiration. Pt followed by SLP services during Dec 2018 admission for CVA (infarct left pons, right parietal lobe, right corpus callosum/cingulate gyrus) - was found to have a mild dysphagia/dysarthria.  She reports increased episodes of choking with meals PTA. Last MBS completed on 01/28/2018 showing sensed peentration of thin liquids. Pt has ongoing spastic dysarthria and hopes for improvement, she reports she had an SLP working with her at home as recently as 6 months ago.   Assessment /  Plan / Recommendation Clinical Impression  Pt demonstrates oral dysphagia secondary to chronic spasticity of oral musculature consistent with prior documentation. Pt does not exhibit any signs of aspiration during todays observation but does report worsening of dysphagia and occasional choking episodes. She has been found to have pna in right posterior lung base. Will  proceed with MBS during admission to check in on function which previously included instances of sensed penetration with thin liquids, but no aspiration. Pt also requesting intervention. SLP Visit Diagnosis: Dysphagia, oropharyngeal phase (R13.12)    Aspiration Risk       Diet Recommendation Thin liquid;Regular   Medication Administration: Whole meds with puree Supervision: Patient able to self feed    Other  Recommendations     Follow up Recommendations        Frequency and Duration            Prognosis        Swallow Study   General HPI: 45 y.o. female,  with a past medical history significant for ESRD on dialysis (T, Th, S), HTN, stroke,T2DM, PAD s/p left BKA, ischemic cardiomyopathy presenting with diarrhea. CT scan of the abdomen and pelvis with contrast significant for focus of pneumonia in the right posterior lung base and degree of interstitial edema in the lungs. Pt reports concern about aspiration. Pt followed by SLP services during Dec 2018 admission for CVA (infarct left pons, right parietal lobe, right corpus callosum/cingulate gyrus) - was found to have a mild dysphagia/dysarthria.  She reports increased episodes of choking with meals PTA. Last MBS completed on 01/28/2018 showing sensed peentration of thin liquids. Pt has ongoing spastic dysarthria and hopes for improvement, she reports she had an SLP working with her at home as recently as 6 months ago. Previous Swallow Assessment: see HPI Diet Prior to this Study: Regular;Thin liquids Temperature Spikes Noted: No Respiratory Status: Room air History of Recent Intubation: No Behavior/Cognition: Alert;Cooperative;Pleasant mood Oral Cavity Assessment: Within Functional Limits Oral Care Completed by SLP: No Oral Cavity - Dentition: Poor condition;Missing dentition Vision: Functional for self-feeding Self-Feeding Abilities: Able to feed self Patient Positioning: Upright in bed Baseline Vocal Quality: Other (comment)  (dysphonic spastic) Volitional Cough: Other (Comment) (pts states she cannot cough) Volitional Swallow: Able to elicit    Oral/Motor/Sensory Function     Ice Chips     Thin Liquid Thin Liquid: Within functional limits Presentation: Straw    Nectar Thick Nectar Thick Liquid: Not tested   Honey Thick Honey Thick Liquid: Not tested   Puree Puree: Impaired Presentation: Spoon Oral Phase Impairments: Reduced lingual movement/coordination   Solid     Solid: Impaired Oral Phase Impairments: Impaired mastication;Reduced lingual movement/coordination     Herbie Baltimore, MA CCC-SLP  Acute Rehabilitation Services Pager [phone] Office [phone]  Othelia Pulling, Katherene Ponto 10/18/2020,9:57 AM

## 2020-10-18 NOTE — ED Notes (Signed)
Pt transferred to hospital bed

## 2020-10-18 NOTE — Progress Notes (Signed)
Patient ID: Karen [name], female   DOB: 1976/04/02, 45 y.o.   MRN: [id number]  PROGRESS NOTE    Karen [name]  RWE:[id number] DOB: 07/31/1976 DOA: 10/17/2020 PCP: Patient, No Pcp Per    Brief Narrative:  Karen [name] is a 45 y.o. female with medical history significant of ESRD on HD (MWF), hypertension, CVA with residual dysarthria, chemic cardiomyopathy, diabetes mellitus type 2 with peripheral neuropathy, bilateral BKA, presents with complaints of 2 weeks of diarrhea.  Patient complains of having several episodes of diarrhea to the point which she been unable to sleep. Denies hematochezia or frank rectal bleeding. She complains of having midline abdominal pain related with symptoms.   Patient dialyzed Wednesday and at that time she was around 94 kg which is below her dry weight of 95 kg.  Patient reports that she is also had a cough during this time, but denies having any significant fevers.  Symptoms she feels like food may go down the wrong way. Acute abdominal series was significant for vascular congestion with mild interstitial edema and normal gas pattern.  C. difficile testing and GI panel were negative.  CT scan of the abdomen and pelvis with contrast significant for focus of pneumonia in the right posterior lung base and degree of interstitial edema in the lungs.   Assessment & Plan:   Principal Problem:   Acute respiratory failure with hypoxia (HCC) Active Problems:   PAD (peripheral artery disease) (HCC)   S/P BKA (below knee amputation) unilateral (HCC)   Anemia of chronic disease   Diarrhea   Type 2 diabetes mellitus with hyperglycemia (HCC)   Hypertensive urgency   Aspiration pneumonia (HCC)  Acute respiratory failure with hypoxia:  -Likely fluid overload, BNP is greater than 2000+ likely some aspiration pneumonia -Nephrology aware -Continuous pulse oximetry maintain O2 saturation greater than 92%  Aspiration pneumonia: Acute.   -Aspiration precautions with elevation of  head of the bed -SLP eval is consistent with aspiration on assessment and barium swallow.--They have recommended regular solids, thin liquids via cup or straw and ongoing home health SLP treatment. -Add on pro-Calcitonin -Rocephin and azithromycin IV -Covid negative  Volume overload in ESRD on HD: Chronic. Patient with some crackles on physical exam and chest x-ray concerning for edema plus elevated BNP.  Last dialyzed on 1/26 and reports at that time she was under her dry weight of 45 kg.  -BNP greater than 2000 -Continue calcitriol and Sensipar -Nephrology consulted for need of hemodialysis in a.m.  Hypertensive urgency: Acute.  Patient reports that she is on metoprolol 12.5 mg daily, amlodipine 10 mg daily,  furosemide 20 mg daily, and is not sure about hydralazine -Continue metoprolol, amlodipine, and furosemide -Hydralazine IV as needed  Diarrhea: Patient reports having several episodes of nonbloody diarrhea over the last 2 weeks.  C. difficile and GI panel both negative. -Imodium as needed  Diabetes mellitus type 2, controlled: On admission glucose was 110.  -Hemoglobin A1c is 5.3 on admission -Patient normally takes Lantus 9-10 units at bedtime. -Hypoglycemic protocol -CBGs before every meal with sensitive SSI -Consider restarting Lantus once tolerating diet  History of CVA with residual deficit Patient with prior history of right MCA stroke with residual dysarthria -Continue statin, Plavix, and aspirin  Peripheral artery disease: Continue statin, Plavix, aspirin  Anemia of chronic disease: Hemoglobin 10.1 which appears near patient's baseline. Denies any complaints of bleeding -Continue to monitor  Thrombocytopenia: Chronic.   89-->94   DVT prophylaxis: SCD/Compression stockings Code Status:  Full code  Family Communication: Patient at bedside Disposition Plan: Home hopefully  Patient remains inpatient due to ongoing IV antibiotic treatment, oxygen  requirements and further evaluation   Consultants:   Nephrology  Procedures:  Hemodialysis  Antimicrobials: Anti-infectives (From admission, onward)   Start     Dose/Rate Route Frequency Ordered Stop   10/17/20 1700  cefTRIAXone (ROCEPHIN) 2 g in sodium chloride 0.9 % 100 mL IVPB        2 g 200 mL/hr over 30 Minutes Intravenous Every 24 hours 10/17/20 1656 10/22/20 1659   10/17/20 1700  azithromycin (ZITHROMAX) 500 mg in sodium chloride 0.9 % 250 mL IVPB        500 mg 250 mL/hr over 60 Minutes Intravenous Every 24 hours 10/17/20 1656 10/22/20 1659   10/17/20 1630  ceFEPIme (MAXIPIME) 1 g in sodium chloride 0.9 % 100 mL IVPB  Status:  Discontinued        1 g 200 mL/hr over 30 Minutes Intravenous Every 24 hours 10/17/20 1600 10/17/20 1629   10/17/20 1630  azithromycin (ZITHROMAX) 500 mg in sodium chloride 0.9 % 250 mL IVPB  Status:  Discontinued        500 mg 250 mL/hr over 60 Minutes Intravenous Every 24 hours 10/17/20 1600 10/17/20 1629       Subjective: Patient is difficult to arouse this morning, she does not give much history.  Objective: Vitals:   10/18/20 0726 10/18/20 0919 10/18/20 1231 10/18/20 1316  BP: (!) 168/102 (!) 160/89 126/79 (!) 143/85  Pulse: 78 78 73 76  Resp: 16 16 16 20   Temp: 97.7 F (36.5 C)   98.9 F (37.2 C)  TempSrc: Oral     SpO2: 100% 96% 98% 91%    Intake/Output Summary (Last 24 hours) at 10/18/2020 1538 Last data filed at 10/17/2020 2341 Gross per 24 hour  Intake 43.4 ml  Output -  Net 43.4 ml   There were no vitals filed for this visit.  Examination:  General exam: Appears calm and comfortable  Respiratory system: Bilateral rales. Respiratory effort normal. Cardiovascular system: S1 & S2 heard, RRR.  Gastrointestinal system: Abdomen is nondistended, soft and nontender.  Central nervous system: Alert and oriented. No focal neurological deficits. Extremities: Symmetric with bilateral BKA's Skin: No rashes, multiple healed  sores   Data Reviewed: I have personally reviewed following labs and imaging studies  CBC: Recent Labs  Lab 10/17/20 0653 10/18/20 0540  WBC 4.8 5.0  HGB 10.6* 10.1*  HCT 35.8* 34.2*  MCV 87.1 86.6  PLT 89* 94*   Basic Metabolic Panel: Recent Labs  Lab 10/17/20 0653 10/18/20 0540  NA 141 138  K 3.5 3.9  CL 98 97*  CO2 27 27  GLUCOSE 110* 102*  BUN 23* 30*  CREATININE 9.52* 10.19*  CALCIUM 9.6 9.2  PHOS  --  6.5*   GFR: CrCl cannot be calculated (Unknown ideal weight.). Liver Function Tests: Recent Labs  Lab 10/17/20 0653 10/18/20 0540  AST 14*  --   ALT 9  --   ALKPHOS 85  --   BILITOT 0.8  --   PROT 7.3  --   ALBUMIN 3.5 3.3*   Recent Labs  Lab 10/17/20 0653  LIPASE 16   HbA1C: Recent Labs    10/18/20 0540  HGBA1C 5.3   CBG: Recent Labs  Lab 10/18/20 0733 10/18/20 1217  GLUCAP 95 109*   Sepsis Labs: Recent Labs  Lab 10/18/20 0540  PROCALCITON 0.24  Recent Results (from the past 240 hour(s))  Gastrointestinal Panel by PCR , Stool     Status: None   Collection Time: 10/17/20  8:53 AM   Specimen: Stool  Result Value Ref Range Status   Campylobacter species NOT DETECTED NOT DETECTED Final   Plesimonas shigelloides NOT DETECTED NOT DETECTED Final   Salmonella species NOT DETECTED NOT DETECTED Final   Yersinia enterocolitica NOT DETECTED NOT DETECTED Final   Vibrio species NOT DETECTED NOT DETECTED Final   Vibrio cholerae NOT DETECTED NOT DETECTED Final   Enteroaggregative E coli (EAEC) NOT DETECTED NOT DETECTED Final   Enteropathogenic E coli (EPEC) NOT DETECTED NOT DETECTED Final   Enterotoxigenic E coli (ETEC) NOT DETECTED NOT DETECTED Final   Shiga like toxin producing E coli (STEC) NOT DETECTED NOT DETECTED Final   Shigella/Enteroinvasive E coli (EIEC) NOT DETECTED NOT DETECTED Final   Cryptosporidium NOT DETECTED NOT DETECTED Final   Cyclospora cayetanensis NOT DETECTED NOT DETECTED Final   Entamoeba histolytica NOT DETECTED  NOT DETECTED Final   Giardia lamblia NOT DETECTED NOT DETECTED Final   Adenovirus F40/41 NOT DETECTED NOT DETECTED Final   Astrovirus NOT DETECTED NOT DETECTED Final   Norovirus GI/GII NOT DETECTED NOT DETECTED Final   Rotavirus A NOT DETECTED NOT DETECTED Final   Sapovirus (I, II, IV, and V) NOT DETECTED NOT DETECTED Final    Comment: Performed at [facility], [address] [place], [place] [address]  C Difficile Quick Screen w PCR reflex     Status: None   Collection Time: 10/17/20  8:54 AM   Specimen: Stool  Result Value Ref Range Status   C Diff antigen NEGATIVE NEGATIVE Final   C Diff toxin NEGATIVE NEGATIVE Final   C Diff interpretation No C. difficile detected.  Final    Comment: Performed at [facility], [place] [address] [place], [place] [address]  SARS Coronavirus 2 by RT PCR (hospital order, performed in [facility] hospital lab) Nasopharyngeal Nasopharyngeal Swab     Status: None   Collection Time: 10/17/20  4:16 PM   Specimen: Nasopharyngeal Swab  Result Value Ref Range Status   SARS Coronavirus 2 NEGATIVE NEGATIVE Final    Comment: (NOTE) SARS-CoV-2 target nucleic acids are NOT DETECTED.  The SARS-CoV-2 RNA is generally detectable in upper and lower respiratory specimens during the acute phase of infection. The lowest concentration of SARS-CoV-2 viral copies this assay can detect is 250 copies / mL. A negative result does not preclude SARS-CoV-2 infection and should not be used as the sole basis for treatment or other patient management decisions.  A negative result may occur with improper specimen collection / handling, submission of specimen other than nasopharyngeal swab, presence of viral mutation(s) within the areas targeted by this assay, and inadequate number of viral copies (<250 copies / mL). A negative result must be combined with clinical observations, patient history, and epidemiological information.  Fact Sheet for Patients:    StrictlyIdeas.no  Fact Sheet for Healthcare Providers: BankingDealers.co.za  This test is not yet approved or  cleared by the [place] FDA and has been authorized for detection and/or diagnosis of SARS-CoV-2 by FDA under an Emergency Use Authorization (EUA).  This EUA will remain in effect (meaning this test can be used) for the duration of the COVID-19 declaration under Section 564(b)(1) of the Act, 21 U.S.C. section 360bbb-3(b)(1), unless the authorization is terminated or revoked sooner.  Performed at [facility], [place] [address],  [place], [place] [address]       Radiology Studies: CT ABDOMEN PELVIS W CONTRAST  Result Date: 10/17/2020 CLINICAL DATA:  Diarrhea and vomiting.  Chronic renal failure EXAM: CT ABDOMEN AND PELVIS WITH CONTRAST TECHNIQUE: Multidetector CT imaging of the abdomen and pelvis was performed using the standard protocol following bolus administration of intravenous contrast. CONTRAST:  165mL OMNIPAQUE IOHEXOL 300 MG/ML  SOLN COMPARISON:  CT abdomen and pelvis July 19, 2020 FINDINGS: Lower chest: There is airspace opacity in the posterior right base consistent with pneumonia. There is mild bibasilar interstitial pulmonary edema. There is cardiomegaly with multiple foci of coronary artery calcification. Hepatobiliary: Liver measures 23.0 cm in length. There is decreased attenuation in the liver, likely due to hepatic steatosis. Liver contour is subtly nodular. No focal liver lesions are evident. Gallbladder is contracted. There is no appreciable biliary duct dilatation. Pancreas: There is no pancreatic mass or inflammatory focus. Spleen: No splenic lesions are evident. Adrenals/Urinary Tract: Adrenals bilaterally appear normal. No evident renal mass. No hydronephrosis on either side. There is extensive peripheral renal artery calcification. No evident renal or ureteral calculus. Urinary bladder is midline with  wall thickness borderline increased. Stomach/Bowel: There is no appreciable bowel wall or mesenteric thickening. No evident bowel obstruction. The terminal ileum appears normal. There is no free air or portal venous air. Vascular/Lymphatic: No abdominal aortic aneurysm. There is aortic and pelvic arterial vascular calcification. Calcification is also noted in peripheral renal arteries as well as in portions of the hepatic artery. No adenopathy is appreciable in the abdomen or pelvis. Reproductive: Uterus is anteverted. Periuterine arterial vascular calcification noted bilaterally. No adnexal masses are evident. Other: Appendix appears normal. There is no evident abscess or ascites in the abdomen pelvis. There is mild edema in the abdominal wall, a likely degree of mild anasarca. There is mild fat in the umbilicus. Musculoskeletal: No blastic or lytic bone lesions. Foci of degenerative change noted in the lumbar spine. No intramuscular lesions are evident. IMPRESSION: 1. Apparent focus of pneumonia in the posterior right base. There is apparent degree of interstitial edema in the lung bases. 2. Enlarged liver with subtle nodular contour of the liver suggesting underlying cirrhosis. No focal liver lesions are appreciable. 3. No bowel obstruction. No abscess in the abdomen or pelvis. Appendix appears normal. 4. Aortic Atherosclerosis (ICD10-I70.0). Extensive arterial and pelvic vascular calcification throughout the abdomen and pelvis. Foci of coronary artery calcification noted. 5.  Mild anasarca in the abdominal wall. Electronically Signed   By: Lowella Grip III M.D.   On: 10/17/2020 12:01   DG Abdomen Acute W/Chest  Result Date: 10/17/2020 CLINICAL DATA:  Abdominal pain, diarrhea.  Dialysis patient EXAM: DG ABDOMEN ACUTE WITH 1 VIEW CHEST COMPARISON:  Chest 10/02/2020 FINDINGS: Cardiac enlargement. Vascular congestion with mild interstitial edema. Small right effusion. Normal bowel gas pattern. No  obstruction or free air. No renal calculi. Diffuse arterial calcification. IMPRESSION: Vascular congestion with mild interstitial edema and small right effusion Normal bowel gas pattern. Electronically Signed   By: Franchot Gallo M.D.   On: 10/17/2020 08:06   DG Swallowing Func-Speech Pathology  Result Date: 10/18/2020 Objective Swallowing Evaluation: Type of Study: MBS-Modified Barium Swallow Study  Patient Details Name: EILEY MCGINNITY MRN: [id number] Date of Birth: [birth year] Today's Date: 10/18/2020 Time: SLP Start Time (ACUTE ONLY): 1045 -SLP Stop Time (ACUTE ONLY): 1100 SLP Time Calculation (min) (ACUTE ONLY): 15 min Past Medical History: Past Medical History: Diagnosis Date . Anemia  . Anxiety  . Eczema  .  ESRD (end stage renal disease) ([place])   [place] - [facility]SAT- [place] . Exertional shortness of breath  . GERD (gastroesophageal reflux disease)  . Headache  . History of blood transfusion  . History of kidney stones   passed . Hypertension  . Ischemic cardiomyopathy   by echo 2014 . Neuropathy  . Pneumonia 09/2016 . Type II diabetes mellitus ([place]) 1995 Past Surgical History: Past Surgical History: Procedure Laterality Date . ABDOMINAL AORTOGRAM W/LOWER EXTREMITY Right 04/03/2020  Procedure: ABDOMINAL AORTOGRAM W/LOWER EXTREMITY;  Surgeon: Marty Heck, MD;  Location: [facility];  Service: Cardiovascular;  Laterality: Right; . AMPUTATION Left 02/06/2013  Procedure: AMPUTATION LEFT GREAT TOE;  Surgeon: Wylene Simmer, MD;  Location: [facility];  Service: Orthopedics;  Laterality: Left; . AMPUTATION Left 06/24/2013  Procedure: AMPUTATION BELOW KNEE ;  Surgeon: Wylene Simmer, MD;  Location: [facility];  Service: Orthopedics;  Laterality: Left; . AMPUTATION Right 04/10/2020  Procedure: RIGHT AMPUTATION BELOW KNEE;  Surgeon: Waynetta Sandy, MD;  Location: [facility];  Service: Vascular;  Laterality: Right; . AV FISTULA PLACEMENT Right 02/08/2017  Procedure: CREATION OF RIGHT ARM  BASILIC VEIN TO BRACHIAL ARTERY  ARTERIOVENOUS (AV) FISTULA;  Surgeon: Rosetta Posner, MD;  Location: [facility];  Service: Vascular;  Laterality: Right; . AV FISTULA PLACEMENT Left 09/10/2017  Procedure: INSERTION OF ARTERIOVENOUS (AV) GORE-TEX GRAFT  LEFT UPPER ARM;  Surgeon: Angelia Mould, MD;  Location: [facility];  Service: Vascular;  Laterality: Left; . BASCILIC VEIN TRANSPOSITION Right 04/14/2017  Procedure: BASCILIC VEIN TRANSPOSITION-RIGHT 2ND STAGE;  Surgeon: Rosetta Posner, MD;  Location: [facility];  Service: Vascular;  Laterality: Right; . CESAREAN SECTION  10/18/2006 . INSERTION OF DIALYSIS CATHETER   . PERIPHERAL VASCULAR BALLOON ANGIOPLASTY Right 04/03/2020  Procedure: PERIPHERAL VASCULAR BALLOON ANGIOPLASTY;  Surgeon: Marty Heck, MD;  Location: [facility];  Service: Cardiovascular;  Laterality: Right;  SFA . WOUND DEBRIDEMENT Right 04/04/2020  Procedure: RIGHT FOOT DEBRIDEMENT;  Surgeon: Waynetta Sandy, MD;  Location: [facility];  Service: Vascular;  Laterality: Right; HPI: 45 y.o. female,  with a past medical history significant for ESRD on dialysis (T, Th, S), HTN, stroke,T2DM, PAD s/p left BKA, ischemic cardiomyopathy presenting with diarrhea. CT scan of the abdomen and pelvis with contrast significant for focus of pneumonia in the right posterior lung base and degree of interstitial edema in the lungs. Pt reports concern about aspiration. Pt followed by SLP services during Dec 2018 admission for CVA (infarct left pons, right parietal lobe, right corpus callosum/cingulate gyrus) - was found to have a mild dysphagia/dysarthria.  She reports increased episodes of choking with meals PTA. Last MBS completed on 01/28/2018 showing sensed peentration of thin liquids. Pt has ongoing spastic dysarthria and hopes for improvement, she reports she had an SLP working with her at home as recently as 6 months ago.  No data recorded Assessment / Plan / Recommendation CHL IP CLINICAL IMPRESSIONS 10/18/2020 Clinical Impression Pt  demonstrates variable ability to time and execute oral and pharyngeal movements during swallowing, at worst leading to trace frank aspiration before the swallow. Oral phase is characterized by slow oral manipulation of bolus, with increased time for posterior propulsion if pt is given a motor command to execute.  Primarily problem is premature spillage causing trace silent penetration. A verbal cue to orally hold the bolus prior to starting the swallow slows down the process, but decreases instance of penetration. Boluses may reach the pharynx with timely laryngeal protection, or occasionally  spill and pool in the pyriforms prior to swallow initiation. Overall, pts function is stable, we discussed compensatory strategies, pt to continue current diet. No acute SLP f/u needed, pt would benefit from f/u with [facility] SLP.  SLP Visit Diagnosis Dysphagia, oropharyngeal phase (R13.12) Attention and concentration deficit following -- Frontal lobe and executive function deficit following -- Impact on safety and function Mild aspiration risk   CHL IP TREATMENT RECOMMENDATION 10/18/2020 Treatment Recommendations No treatment recommended at this time   Prognosis 09/11/2017 Prognosis for Safe Diet Advancement Good Barriers to Reach Goals Cognitive deficits Barriers/Prognosis Comment -- CHL IP DIET RECOMMENDATION 10/18/2020 SLP Diet Recommendations Regular solids;Thin liquid Liquid Administration via Cup;Straw Medication Administration -- Compensations -- Postural Changes --   CHL IP OTHER RECOMMENDATIONS 10/18/2020 Recommended Consults -- Oral Care Recommendations Oral care BID Other Recommendations --   CHL IP FOLLOW UP RECOMMENDATIONS 10/18/2020 Follow up Recommendations 24 hour supervision/assistance;Home health SLP   CHL IP FREQUENCY AND DURATION 04/11/2020 Speech Therapy Frequency (ACUTE ONLY) min 2x/week Treatment Duration --      CHL IP ORAL PHASE 10/18/2020 Oral Phase Impaired Oral - Pudding Teaspoon -- Oral - Pudding Cup -- Oral -  Honey Teaspoon -- Oral - Honey Cup -- Oral - Nectar Teaspoon -- Oral - Nectar Cup -- Oral - Nectar Straw -- Oral - Thin Teaspoon -- Oral - Thin Cup Premature spillage;Delayed oral transit;Holding of bolus Oral - Thin Straw Premature spillage;Delayed oral transit;Holding of bolus Oral - Puree Delayed oral transit;Holding of bolus Oral - Mech Soft -- Oral - Regular Delayed oral transit;Holding of bolus Oral - Multi-Consistency -- Oral - Pill Premature spillage;Delayed oral transit;Holding of bolus Oral Phase - Comment --  CHL IP PHARYNGEAL PHASE 10/18/2020 Pharyngeal Phase Impaired Pharyngeal- Pudding Teaspoon -- Pharyngeal -- Pharyngeal- Pudding Cup -- Pharyngeal -- Pharyngeal- Honey Teaspoon -- Pharyngeal -- Pharyngeal- Honey Cup -- Pharyngeal -- Pharyngeal- Nectar Teaspoon -- Pharyngeal -- Pharyngeal- Nectar Cup -- Pharyngeal -- Pharyngeal- Nectar Straw -- Pharyngeal -- Pharyngeal- Thin Teaspoon -- Pharyngeal -- Pharyngeal- Thin Cup Delayed swallow initiation-vallecula;Delayed swallow initiation-pyriform sinuses;Penetration/Aspiration before swallow Pharyngeal Material enters airway, CONTACTS cords and not ejected out;Material does not enter airway Pharyngeal- Thin Straw Delayed swallow initiation-pyriform sinuses;Delayed swallow initiation-vallecula;Penetration/Aspiration before swallow Pharyngeal Material enters airway, CONTACTS cords and not ejected out;Material does not enter airway Pharyngeal- Puree Delayed swallow initiation-vallecula;Delayed swallow initiation-pyriform sinuses Pharyngeal -- Pharyngeal- Mechanical Soft Delayed swallow initiation-vallecula;Delayed swallow initiation-pyriform sinuses Pharyngeal -- Pharyngeal- Regular -- Pharyngeal -- Pharyngeal- Multi-consistency -- Pharyngeal -- Pharyngeal- Pill -- Pharyngeal -- Pharyngeal Comment --  CHL IP CERVICAL ESOPHAGEAL PHASE 01/28/2018 Cervical Esophageal Phase WFL Pudding Teaspoon -- Pudding Cup -- Honey Teaspoon -- Honey Cup -- Nectar Teaspoon --  Nectar Cup -- Nectar Straw -- Thin Teaspoon -- Thin Cup -- Thin Straw -- Puree -- Mechanical Soft -- Regular -- Multi-consistency -- Pill -- Cervical Esophageal Comment -- Herbie Baltimore, MA CCC-SLP Acute Rehabilitation Services Pager [phone] Office [phone] Lynann Beaver 10/18/2020, 12:06 PM                Scheduled Meds: . amLODipine  10 mg Oral Daily  . atorvastatin  40 mg Oral Daily  . calcitRIOL  1.5 mcg Oral Q M,W,F  . cinacalcet  30 mg Oral Q M,W,F-HD  . clopidogrel  75 mg Oral Q breakfast  . furosemide  20 mg Oral Daily  . insulin aspart  0-6 Units Subcutaneous TID WC  . metoprolol succinate  12.5 mg Oral Daily  . multivitamin  1 tablet Oral QHS  . pantoprazole  40 mg Oral QAC breakfast  . sodium chloride flush  3 mL Intravenous Q12H   Continuous Infusions: . azithromycin Stopped (10/18/20 0350)  . cefTRIAXone (ROCEPHIN)  IV Stopped (10/17/20 2341)     LOS: 1 day    Donnamae Jude, MD 10/18/2020 3:38 PM [phone] [facility] If 7PM-7AM, please contact night-coverage 10/18/2020, 3:38 PM

## 2020-10-18 NOTE — ED Notes (Signed)
Pt refusing to keep monitoring devices on; risks of vitals not being monitored explained to pt; pt verbalizes understanding, still refusing to be monitored

## 2020-10-19 DIAGNOSIS — J9601 Acute respiratory failure with hypoxia: Secondary | ICD-10-CM | POA: Diagnosis not present

## 2020-10-19 LAB — CBC
HCT: 32.3 % — ABNORMAL LOW (ref 36.0–46.0)
Hemoglobin: 9.7 g/dL — ABNORMAL LOW (ref 12.0–15.0)
MCH: 25.5 pg — ABNORMAL LOW (ref 26.0–34.0)
MCHC: 30 g/dL (ref 30.0–36.0)
MCV: 85 fL (ref 80.0–100.0)
Platelets: 85 10*3/uL — ABNORMAL LOW (ref 150–400)
RBC: 3.8 MIL/uL — ABNORMAL LOW (ref 3.87–5.11)
RDW: 19.9 % — ABNORMAL HIGH (ref 11.5–15.5)
WBC: 4.6 10*3/uL (ref 4.0–10.5)
nRBC: 0 % (ref 0.0–0.2)

## 2020-10-19 LAB — BASIC METABOLIC PANEL
Anion gap: 16 — ABNORMAL HIGH (ref 5–15)
BUN: 26 mg/dL — ABNORMAL HIGH (ref 6–20)
CO2: 24 mmol/L (ref 22–32)
Calcium: 8.7 mg/dL — ABNORMAL LOW (ref 8.9–10.3)
Chloride: 97 mmol/L — ABNORMAL LOW (ref 98–111)
Creatinine, Ser: 8.76 mg/dL — ABNORMAL HIGH (ref 0.44–1.00)
GFR, Estimated: 5 mL/min — ABNORMAL LOW (ref >60)
Glucose, Bld: 106 mg/dL — ABNORMAL HIGH (ref 70–99)
Potassium: 3.4 mmol/L — ABNORMAL LOW (ref 3.5–5.1)
Sodium: 137 mmol/L (ref 135–145)

## 2020-10-19 LAB — GLUCOSE, CAPILLARY
Glucose-Capillary: 100 mg/dL — ABNORMAL HIGH (ref 70–99)
Glucose-Capillary: 106 mg/dL — ABNORMAL HIGH (ref 70–99)
Glucose-Capillary: 117 mg/dL — ABNORMAL HIGH (ref 70–99)

## 2020-10-19 MED ORDER — AZITHROMYCIN 250 MG PO TABS
500.0000 mg | ORAL_TABLET | Freq: Every day | ORAL | Status: DC
  Administered 2020-10-19 – 2020-10-20 (×2): 500 mg via ORAL
  Filled 2020-10-19 (×2): qty 2

## 2020-10-19 MED ORDER — ACETAMINOPHEN 325 MG PO TABS
ORAL_TABLET | ORAL | Status: AC
  Filled 2020-10-19: qty 2

## 2020-10-19 NOTE — Progress Notes (Signed)
PROGRESS NOTE    Karen [name]    Code Status: Full Code  UXN:[id number] DOB: May 20, 1976 DOA: 10/17/2020 LOS: 2 days  PCP: Patient, No Pcp Per CC:  Chief Complaint  Patient presents with  . Diarrhea       Hospital Summary   This is a 45 year old female with past medical history of ESRD on HD (MWF), hypertension, CVA with residual dysarthria, cardiomyopathy, type 2 diabetes with peripheral neuropathy, s/p bilateral BKA who presented to the ED on 1/27 with 2 weeks of nonbloody diarrhea with associated midline abdominal pain also noted that she had a cough during this period of time without any significant fevers.  Reported she feels like food goes down the wrong way when eating.  Acute abdominal series was significant for vascular congestion with mild interstitial edema and normal gas pattern.  C. difficile and GI panel were negative.  CT abdomen pelvis with contrast significant for right posterior lung base pneumonia and interstitial edema in the lungs.  She was started on Rocephin and azithromycin for aspiration pneumonia.  Nephrology was consulted for ESRD    A & P   Principal Problem:   Acute respiratory failure with hypoxia ([place]) Active Problems:   PAD (peripheral artery disease) (HCC)   S/P BKA (below knee amputation) unilateral (HCC)   Anemia of chronic disease   Diarrhea   Type 2 diabetes mellitus with hyperglycemia ([place])   Hypertensive urgency   Aspiration pneumonia ([place])   1. Acute hypoxic respiratory failure likely secondary to volume overload and aspiration pneumonia a. Currently on room air  2. Aspiration pneumonia a. SLP eval consistent with aspiration on assessment and barium swallow b. Complete 5 days antibiotics, currently on ceftriaxone and azithromycin  3. Volume overload in the setting of ESRD on HD (MWF) a. Missed HD on 10/16/2020, possibly the inciting event? b. On calcitriol and Sensipar c. Elevated BNP unreliable and ESRD d. Nephrology on board for HD,  treated today  4. Hypertension a. Continue amlodipine and metoprolol  5. Anemia a. Given Aranesp per nephro with HD today  6. Type 2 diabetes, controlled a. Continue sliding scale  7. Nonbloody diarrhea of unknown etiology, resolved a. Possibly from viral syndrome?  8. PAD a. On statin, Plavix, aspirin  9. Chronic thrombocytopenia   DVT prophylaxis: Place and maintain sequential compression device Start: 10/17/20 1731   Family Communication: Patient updated at bedside  Disposition Plan: Patient requesting to go to SNF as she does not have enough care at home.  TOC and PT consulted.  Dispo pending nephrology and PT and TOC plan.  Likely medically stable for discharge in 24 to 48 hours Status is: Inpatient  Remains inpatient appropriate because:IV treatments appropriate due to intensity of illness or inability to take PO and Inpatient level of care appropriate due to severity of illness   Dispo: The patient is from: Home              Anticipated d/c is to: TBD              Anticipated d/c date is: 1 day              Patient currently is not medically stable to d/c.   Difficult to place patient No           Pressure injury documentation    None  Consultants  Nephrology   Procedures  HD   Antibiotics   Anti-infectives (From admission, onward)   Start  Dose/Rate Route Frequency Ordered Stop   10/19/20 1700  azithromycin (ZITHROMAX) tablet 500 mg        500 mg Oral Daily 10/19/20 1417 10/23/20 1659   10/17/20 1700  cefTRIAXone (ROCEPHIN) 2 g in sodium chloride 0.9 % 100 mL IVPB        2 g 200 mL/hr over 30 Minutes Intravenous Every 24 hours 10/17/20 1656 10/22/20 1659   10/17/20 1700  azithromycin (ZITHROMAX) 500 mg in sodium chloride 0.9 % 250 mL IVPB  Status:  Discontinued        500 mg 250 mL/hr over 60 Minutes Intravenous Every 24 hours 10/17/20 1656 10/19/20 1417   10/17/20 1630  ceFEPIme (MAXIPIME) 1 g in sodium chloride 0.9 % 100 mL IVPB  Status:   Discontinued        1 g 200 mL/hr over 30 Minutes Intravenous Every 24 hours 10/17/20 1600 10/17/20 1629   10/17/20 1630  azithromycin (ZITHROMAX) 500 mg in sodium chloride 0.9 % 250 mL IVPB  Status:  Discontinued        500 mg 250 mL/hr over 60 Minutes Intravenous Every 24 hours 10/17/20 1600 10/17/20 1629        Subjective   Patient seen and examined at dialysis resting comfortably.  Currently denies any complaints of chest pain or shortness of breath.  She is not very interactive today.  Objective   Vitals:   10/19/20 0923 10/19/20 0929 10/19/20 0944 10/19/20 0959  BP: (!) 184/90 (!) 169/94 (!) 162/108   Pulse: 80 79  (!) 101  Resp: 18     Temp: (!) 97.5 F (36.4 C)     TempSrc: Oral     SpO2: 92%     Weight: 95.2 kg       Intake/Output Summary (Last 24 hours) at 10/19/2020 1559 Last data filed at 10/19/2020 0300 Gross per 24 hour  Intake 350 ml  Output -  Net 350 ml   Filed Weights   10/19/20 0923  Weight: 95.2 kg    Examination:  Physical Exam Vitals and nursing note reviewed.  Constitutional:      General: She is not in acute distress. HENT:     Head: Normocephalic.  Eyes:     Conjunctiva/sclera: Conjunctivae normal.  Cardiovascular:     Rate and Rhythm: Normal rate and regular rhythm.  Pulmonary:     Effort: Pulmonary effort is normal.     Breath sounds: Normal breath sounds.  Abdominal:     General: Abdomen is flat.     Palpations: Abdomen is soft.  Neurological:     Mental Status: She is alert. Mental status is at baseline.     Data Reviewed: I have personally reviewed following labs and imaging studies  CBC: Recent Labs  Lab 10/17/20 0653 10/18/20 0540 10/19/20 1015  WBC 4.8 5.0 4.6  HGB 10.6* 10.1* 9.7*  HCT 35.8* 34.2* 32.3*  MCV 87.1 86.6 85.0  PLT 89* 94* 85*   Basic Metabolic Panel: Recent Labs  Lab 10/17/20 0653 10/18/20 0540 10/19/20 1015  NA 141 138 137  K 3.5 3.9 3.4*  CL 98 97* 97*  CO2 27 27 24   GLUCOSE 110*  102* 106*  BUN 23* 30* 26*  CREATININE 9.52* 10.19* 8.76*  CALCIUM 9.6 9.2 8.7*  PHOS  --  6.5*  --    GFR: CrCl cannot be calculated (Unknown ideal weight.). Liver Function Tests: Recent Labs  Lab 10/17/20 0653 10/18/20 0540  AST 14*  --  ALT 9  --   ALKPHOS 85  --   BILITOT 0.8  --   PROT 7.3  --   ALBUMIN 3.5 3.3*   Recent Labs  Lab 10/17/20 0653  LIPASE 16   No results for input(s): AMMONIA in the last 168 hours. Coagulation Profile: No results for input(s): INR, PROTIME in the last 168 hours. Cardiac Enzymes: No results for input(s): CKTOTAL, CKMB, CKMBINDEX, TROPONINI in the last 168 hours. BNP (last 3 results) No results for input(s): PROBNP in the last 8760 hours. HbA1C: Recent Labs    10/18/20 0540  HGBA1C 5.3   CBG: Recent Labs  Lab 10/18/20 0733 10/18/20 1217 10/18/20 2131 10/19/20 0655 10/19/20 0800  GLUCAP 95 109* 114* 100* 106*   Lipid Profile: No results for input(s): CHOL, HDL, LDLCALC, TRIG, CHOLHDL, LDLDIRECT in the last 72 hours. Thyroid Function Tests: No results for input(s): TSH, T4TOTAL, FREET4, T3FREE, THYROIDAB in the last 72 hours. Anemia Panel: No results for input(s): VITAMINB12, FOLATE, FERRITIN, TIBC, IRON, RETICCTPCT in the last 72 hours. Sepsis Labs: Recent Labs  Lab 10/18/20 0540  PROCALCITON 0.24    Recent Results (from the past 240 hour(s))  Gastrointestinal Panel by PCR , Stool     Status: None   Collection Time: 10/17/20  8:53 AM   Specimen: Stool  Result Value Ref Range Status   Campylobacter species NOT DETECTED NOT DETECTED Final   Plesimonas shigelloides NOT DETECTED NOT DETECTED Final   Salmonella species NOT DETECTED NOT DETECTED Final   Yersinia enterocolitica NOT DETECTED NOT DETECTED Final   Vibrio species NOT DETECTED NOT DETECTED Final   Vibrio cholerae NOT DETECTED NOT DETECTED Final   Enteroaggregative E coli (EAEC) NOT DETECTED NOT DETECTED Final   Enteropathogenic E coli (EPEC) NOT DETECTED  NOT DETECTED Final   Enterotoxigenic E coli (ETEC) NOT DETECTED NOT DETECTED Final   Shiga like toxin producing E coli (STEC) NOT DETECTED NOT DETECTED Final   Shigella/Enteroinvasive E coli (EIEC) NOT DETECTED NOT DETECTED Final   Cryptosporidium NOT DETECTED NOT DETECTED Final   Cyclospora cayetanensis NOT DETECTED NOT DETECTED Final   Entamoeba histolytica NOT DETECTED NOT DETECTED Final   Giardia lamblia NOT DETECTED NOT DETECTED Final   Adenovirus F40/41 NOT DETECTED NOT DETECTED Final   Astrovirus NOT DETECTED NOT DETECTED Final   Norovirus GI/GII NOT DETECTED NOT DETECTED Final   Rotavirus A NOT DETECTED NOT DETECTED Final   Sapovirus (I, II, IV, and V) NOT DETECTED NOT DETECTED Final    Comment: Performed at [facility], [address] [place], [place] [address]  C Difficile Quick Screen w PCR reflex     Status: None   Collection Time: 10/17/20  8:54 AM   Specimen: Stool  Result Value Ref Range Status   C Diff antigen NEGATIVE NEGATIVE Final   C Diff toxin NEGATIVE NEGATIVE Final   C Diff interpretation No C. difficile detected.  Final    Comment: Performed at [facility], [place] [address] [place], [place] [address]  SARS Coronavirus 2 by RT PCR (hospital order, performed in [facility] hospital lab) Nasopharyngeal Nasopharyngeal Swab     Status: None   Collection Time: 10/17/20  4:16 PM   Specimen: Nasopharyngeal Swab  Result Value Ref Range Status   SARS Coronavirus 2 NEGATIVE NEGATIVE Final    Comment: (NOTE) SARS-CoV-2 target nucleic acids are NOT DETECTED.  The SARS-CoV-2 RNA is generally detectable in upper and lower respiratory specimens during the acute phase  of infection. The lowest concentration of SARS-CoV-2 viral copies this assay can detect is 250 copies / mL. A negative result does not preclude SARS-CoV-2 infection and should not be used as the sole basis for treatment or other patient management decisions.  A negative result may occur  with improper specimen collection / handling, submission of specimen other than nasopharyngeal swab, presence of viral mutation(s) within the areas targeted by this assay, and inadequate number of viral copies (<250 copies / mL). A negative result must be combined with clinical observations, patient history, and epidemiological information.  Fact Sheet for Patients:   StrictlyIdeas.no  Fact Sheet for Healthcare Providers: BankingDealers.co.za  This test is not yet approved or  cleared by the [place] FDA and has been authorized for detection and/or diagnosis of SARS-CoV-2 by FDA under an Emergency Use Authorization (EUA).  This EUA will remain in effect (meaning this test can be used) for the duration of the COVID-19 declaration under Section 564(b)(1) of the Act, 21 U.S.C. section 360bbb-3(b)(1), unless the authorization is terminated or revoked sooner.  Performed at [facility], [place] [address] [place], [place] [address]          Radiology Studies: DG Swallowing [facility] Pathology  Result Date: 10/18/2020 Objective Swallowing Evaluation: Type of Study: MBS-Modified Barium Swallow Study  Patient Details Name: TANNER VIGNA MRN: [id number] Date of Birth: 20-Nov-1975 Today's Date: 10/18/2020 Time: SLP Start Time (ACUTE ONLY): 1045 -SLP Stop Time (ACUTE ONLY): 1100 SLP Time Calculation (min) (ACUTE ONLY): 15 min Past Medical History: Past Medical History: Diagnosis Date . Anemia  . Anxiety  . Eczema  . ESRD (end stage renal disease) ([place])   [place] - TTHSAT- [place] . Exertional shortness of breath  . GERD (gastroesophageal reflux disease)  . Headache  . History of blood transfusion  . History of kidney stones   passed . Hypertension  . Ischemic cardiomyopathy   by echo 2014 . Neuropathy  . Pneumonia 09/2016 . Type II diabetes mellitus ([place]) 1995 Past Surgical History: Past Surgical History: Procedure Laterality Date . ABDOMINAL  AORTOGRAM W/LOWER EXTREMITY Right 04/03/2020  Procedure: ABDOMINAL AORTOGRAM W/LOWER EXTREMITY;  Surgeon: Marty Heck, MD;  Location: [facility];  Service: Cardiovascular;  Laterality: Right; . AMPUTATION Left 02/06/2013  Procedure: AMPUTATION LEFT GREAT TOE;  Surgeon: Wylene Simmer, MD;  Location: [facility];  Service: Orthopedics;  Laterality: Left; . AMPUTATION Left 06/24/2013  Procedure: AMPUTATION BELOW KNEE ;  Surgeon: Wylene Simmer, MD;  Location: [facility];  Service: Orthopedics;  Laterality: Left; . AMPUTATION Right 04/10/2020  Procedure: RIGHT AMPUTATION BELOW KNEE;  Surgeon: Waynetta Sandy, MD;  Location: [facility];  Service: Vascular;  Laterality: Right; . AV FISTULA PLACEMENT Right 02/08/2017  Procedure: CREATION OF RIGHT ARM  BASILIC VEIN TO BRACHIAL ARTERY ARTERIOVENOUS (AV) FISTULA;  Surgeon: Rosetta Posner, MD;  Location: [facility];  Service: Vascular;  Laterality: Right; . AV FISTULA PLACEMENT Left 09/10/2017  Procedure: INSERTION OF ARTERIOVENOUS (AV) GORE-TEX GRAFT  LEFT UPPER ARM;  Surgeon: Angelia Mould, MD;  Location: [facility];  Service: Vascular;  Laterality: Left; . BASCILIC VEIN TRANSPOSITION Right 04/14/2017  Procedure: BASCILIC VEIN TRANSPOSITION-RIGHT 2ND STAGE;  Surgeon: Rosetta Posner, MD;  Location: [facility];  Service: Vascular;  Laterality: Right; . CESAREAN SECTION  10/18/2006 . INSERTION OF DIALYSIS CATHETER   . PERIPHERAL VASCULAR BALLOON ANGIOPLASTY Right 04/03/2020  Procedure: PERIPHERAL VASCULAR BALLOON ANGIOPLASTY;  Surgeon: Marty Heck, MD;  Location: [facility];  Service: Cardiovascular;  Laterality: Right;  SFA . WOUND DEBRIDEMENT Right 04/04/2020  Procedure: RIGHT FOOT DEBRIDEMENT;  Surgeon: Waynetta Sandy, MD;  Location: [facility];  Service: Vascular;  Laterality: Right; HPI: 45 y.o. female,  with a past medical history significant for ESRD on dialysis (T, Th, S), HTN, stroke,T2DM, PAD s/p left BKA, ischemic cardiomyopathy presenting with diarrhea.  CT scan of the abdomen and pelvis with contrast significant for focus of pneumonia in the right posterior lung base and degree of interstitial edema in the lungs. Pt reports concern about aspiration. Pt followed by SLP services during Dec 2018 admission for CVA (infarct left pons, right parietal lobe, right corpus callosum/cingulate gyrus) - was found to have a mild dysphagia/dysarthria.  She reports increased episodes of choking with meals PTA. Last MBS completed on 01/28/2018 showing sensed peentration of thin liquids. Pt has ongoing spastic dysarthria and hopes for improvement, she reports she had an SLP working with her at home as recently as 6 months ago.  No data recorded Assessment / Plan / Recommendation CHL IP CLINICAL IMPRESSIONS 10/18/2020 Clinical Impression Pt demonstrates variable ability to time and execute oral and pharyngeal movements during swallowing, at worst leading to trace frank aspiration before the swallow. Oral phase is characterized by slow oral manipulation of bolus, with increased time for posterior propulsion if pt is given a motor command to execute.  Primarily problem is premature spillage causing trace silent penetration. A verbal cue to orally hold the bolus prior to starting the swallow slows down the process, but decreases instance of penetration. Boluses may reach the pharynx with timely laryngeal protection, or occasionally spill and pool in the pyriforms prior to swallow initiation. Overall, pts function is stable, we discussed compensatory strategies, pt to continue current diet. No acute SLP f/u needed, pt would benefit from f/u with [facility] SLP.  SLP Visit Diagnosis Dysphagia, oropharyngeal phase (R13.12) Attention and concentration deficit following -- Frontal lobe and executive function deficit following -- Impact on safety and function Mild aspiration risk   CHL IP TREATMENT RECOMMENDATION 10/18/2020 Treatment Recommendations No treatment recommended at this time   Prognosis  09/11/2017 Prognosis for Safe Diet Advancement Good Barriers to Reach Goals Cognitive deficits Barriers/Prognosis Comment -- CHL IP DIET RECOMMENDATION 10/18/2020 SLP Diet Recommendations Regular solids;Thin liquid Liquid Administration via Cup;Straw Medication Administration -- Compensations -- Postural Changes --   CHL IP OTHER RECOMMENDATIONS 10/18/2020 Recommended Consults -- Oral Care Recommendations Oral care BID Other Recommendations --   CHL IP FOLLOW UP RECOMMENDATIONS 10/18/2020 Follow up Recommendations 24 hour supervision/assistance;Home health SLP   CHL IP FREQUENCY AND DURATION 04/11/2020 Speech Therapy Frequency (ACUTE ONLY) min 2x/week Treatment Duration --      CHL IP ORAL PHASE 10/18/2020 Oral Phase Impaired Oral - Pudding Teaspoon -- Oral - Pudding Cup -- Oral - Honey Teaspoon -- Oral - Honey Cup -- Oral - Nectar Teaspoon -- Oral - Nectar Cup -- Oral - Nectar Straw -- Oral - Thin Teaspoon -- Oral - Thin Cup Premature spillage;Delayed oral transit;Holding of bolus Oral - Thin Straw Premature spillage;Delayed oral transit;Holding of bolus Oral - Puree Delayed oral transit;Holding of bolus Oral - Mech Soft -- Oral - Regular Delayed oral transit;Holding of bolus Oral - Multi-Consistency -- Oral - Pill Premature spillage;Delayed oral transit;Holding of bolus Oral Phase - Comment --  CHL IP PHARYNGEAL PHASE 10/18/2020 Pharyngeal Phase Impaired Pharyngeal- Pudding Teaspoon -- Pharyngeal -- Pharyngeal- Pudding Cup -- Pharyngeal -- Pharyngeal- Honey Teaspoon -- Pharyngeal -- Pharyngeal- Honey Cup --  Pharyngeal -- Pharyngeal- Nectar Teaspoon -- Pharyngeal -- Pharyngeal- Nectar Cup -- Pharyngeal -- Pharyngeal- Nectar Straw -- Pharyngeal -- Pharyngeal- Thin Teaspoon -- Pharyngeal -- Pharyngeal- Thin Cup Delayed swallow initiation-vallecula;Delayed swallow initiation-pyriform sinuses;Penetration/Aspiration before swallow Pharyngeal Material enters airway, CONTACTS cords and not ejected out;Material does not enter  airway Pharyngeal- Thin Straw Delayed swallow initiation-pyriform sinuses;Delayed swallow initiation-vallecula;Penetration/Aspiration before swallow Pharyngeal Material enters airway, CONTACTS cords and not ejected out;Material does not enter airway Pharyngeal- Puree Delayed swallow initiation-vallecula;Delayed swallow initiation-pyriform sinuses Pharyngeal -- Pharyngeal- Mechanical Soft Delayed swallow initiation-vallecula;Delayed swallow initiation-pyriform sinuses Pharyngeal -- Pharyngeal- Regular -- Pharyngeal -- Pharyngeal- Multi-consistency -- Pharyngeal -- Pharyngeal- Pill -- Pharyngeal -- Pharyngeal Comment --  CHL IP CERVICAL ESOPHAGEAL PHASE 01/28/2018 Cervical Esophageal Phase WFL Pudding Teaspoon -- Pudding Cup -- Honey Teaspoon -- Honey Cup -- Nectar Teaspoon -- Nectar Cup -- Nectar Straw -- Thin Teaspoon -- Thin Cup -- Thin Straw -- Puree -- Mechanical Soft -- Regular -- Multi-consistency -- Pill -- Cervical Esophageal Comment -- Herbie Baltimore, MA CCC-SLP Acute Rehabilitation Services Pager [phone] Office [phone] Lynann Beaver 10/18/2020, 12:06 PM                   Scheduled Meds: . acetaminophen      . amLODipine  10 mg Oral Daily  . atorvastatin  40 mg Oral Daily  . azithromycin  500 mg Oral Daily  . Chlorhexidine Gluconate Cloth  6 each Topical Q0600  . clopidogrel  75 mg Oral Q breakfast  . doxercalciferol  4 mcg Intravenous Q M,W,F-HD  . feeding supplement (NEPRO CARB STEADY)  237 mL Oral BID BM  . insulin aspart  0-6 Units Subcutaneous TID WC  . metoprolol succinate  12.5 mg Oral Daily  . multivitamin  1 tablet Oral QHS  . pantoprazole  40 mg Oral QAC breakfast  . sevelamer carbonate  2,400 mg Oral TID WC  . sodium chloride flush  3 mL Intravenous Q12H   Continuous Infusions: . cefTRIAXone (ROCEPHIN)  IV 2 g (10/18/20 1745)     Time spent: 20 minutes with over 50% of the time coordinating the patient's care    Harold Hedge, DO [facility]   Call night coverage person covering after 7pm

## 2020-10-19 NOTE — Progress Notes (Addendum)
[facility] Progress Note   Subjective: Seen on HD. Congenial, making jokes about "standing up to weigh". Denies SOB. Says her CAP aide hours have been decreased. Again asking to go to SNF on discharge. Says she doesn't have enough care hours at home.   Objective Vitals:   10/18/20 1316 10/18/20 2127 10/19/20 0323 10/19/20 0338  BP: (!) 143/85 (!) 169/92 (!) 155/77   Pulse: 76 78 76 76  Resp: 20 18 20  (!) 22  Temp: 98.9 F (37.2 C) 98.2 F (36.8 C) 97.6 F (36.4 C)   TempSrc:  Oral    SpO2: 91% 98% 90%    Physical Exam General: Chronically ill appearing female in NAD Heart: S1,S2 RRR Lungs: Bilateral breath sounds with few scattered bibasilar crackles. No wheezing, no WOB.  Abdomen: Obese, active BS Extremities:Trace stump edema bilaterally Dialysis Access: AVG + bruit    Additional Objective Labs: Basic Metabolic Panel: Recent Labs  Lab 10/17/20 0653 10/18/20 0540  NA 141 138  K 3.5 3.9  CL 98 97*  CO2 27 27  GLUCOSE 110* 102*  BUN 23* 30*  CREATININE 9.52* 10.19*  CALCIUM 9.6 9.2  PHOS  --  6.5*   Liver Function Tests: Recent Labs  Lab 10/17/20 0653 10/18/20 0540  AST 14*  --   ALT 9  --   ALKPHOS 85  --   BILITOT 0.8  --   PROT 7.3  --   ALBUMIN 3.5 3.3*   Recent Labs  Lab 10/17/20 0653  LIPASE 16   CBC: Recent Labs  Lab 10/17/20 0653 10/18/20 0540  WBC 4.8 5.0  HGB 10.6* 10.1*  HCT 35.8* 34.2*  MCV 87.1 86.6  PLT 89* 94*   Blood Culture    Component Value Date/Time   SDES BLOOD LEFT ANTECUBITAL 04/08/2020 1028   SPECREQUEST  04/08/2020 1028    BOTTLES DRAWN AEROBIC ONLY Blood Culture results may not be optimal due to an inadequate volume of blood received in culture bottles   CULT  04/08/2020 1028    NO GROWTH 5 DAYS Performed at [facility], [place] [address], [place], [place] [address]    REPTSTATUS 04/13/2020 FINAL 04/08/2020 1028    Cardiac Enzymes: No results for input(s): CKTOTAL, CKMB, CKMBINDEX,  TROPONINI in the last 168 hours. CBG: Recent Labs  Lab 10/18/20 0733 10/18/20 1217 10/18/20 2131 10/19/20 0655 10/19/20 0800  GLUCAP 95 109* 114* 100* 106*   Iron Studies: No results for input(s): IRON, TIBC, TRANSFERRIN, FERRITIN in the last 72 hours. @lablastinr3 @ Studies/Results: CT ABDOMEN PELVIS W CONTRAST  Result Date: 10/17/2020 CLINICAL DATA:  Diarrhea and vomiting.  Chronic renal failure EXAM: CT ABDOMEN AND PELVIS WITH CONTRAST TECHNIQUE: Multidetector CT imaging of the abdomen and pelvis was performed using the standard protocol following bolus administration of intravenous contrast. CONTRAST:  149mL OMNIPAQUE IOHEXOL 300 MG/ML  SOLN COMPARISON:  CT abdomen and pelvis July 19, 2020 FINDINGS: Lower chest: There is airspace opacity in the posterior right base consistent with pneumonia. There is mild bibasilar interstitial pulmonary edema. There is cardiomegaly with multiple foci of coronary artery calcification. Hepatobiliary: Liver measures 23.0 cm in length. There is decreased attenuation in the liver, likely due to hepatic steatosis. Liver contour is subtly nodular. No focal liver lesions are evident. Gallbladder is contracted. There is no appreciable biliary duct dilatation. Pancreas: There is no pancreatic mass or inflammatory focus. Spleen: No splenic lesions are evident. Adrenals/Urinary Tract: Adrenals bilaterally appear normal. No evident renal mass. No  hydronephrosis on either side. There is extensive peripheral renal artery calcification. No evident renal or ureteral calculus. Urinary bladder is midline with wall thickness borderline increased. Stomach/Bowel: There is no appreciable bowel wall or mesenteric thickening. No evident bowel obstruction. The terminal ileum appears normal. There is no free air or portal venous air. Vascular/Lymphatic: No abdominal aortic aneurysm. There is aortic and pelvic arterial vascular calcification. Calcification is also noted in peripheral  renal arteries as well as in portions of the hepatic artery. No adenopathy is appreciable in the abdomen or pelvis. Reproductive: Uterus is anteverted. Periuterine arterial vascular calcification noted bilaterally. No adnexal masses are evident. Other: Appendix appears normal. There is no evident abscess or ascites in the abdomen pelvis. There is mild edema in the abdominal wall, a likely degree of mild anasarca. There is mild fat in the umbilicus. Musculoskeletal: No blastic or lytic bone lesions. Foci of degenerative change noted in the lumbar spine. No intramuscular lesions are evident. IMPRESSION: 1. Apparent focus of pneumonia in the posterior right base. There is apparent degree of interstitial edema in the lung bases. 2. Enlarged liver with subtle nodular contour of the liver suggesting underlying cirrhosis. No focal liver lesions are appreciable. 3. No bowel obstruction. No abscess in the abdomen or pelvis. Appendix appears normal. 4. Aortic Atherosclerosis (ICD10-I70.0). Extensive arterial and pelvic vascular calcification throughout the abdomen and pelvis. Foci of coronary artery calcification noted. 5.  Mild anasarca in the abdominal wall. Electronically Signed   By: Lowella Grip III M.D.   On: 10/17/2020 12:01   DG Swallowing Func-Speech Pathology  Result Date: 10/18/2020 Objective Swallowing Evaluation: Type of Study: MBS-Modified Barium Swallow Study  Patient Details Name: LILLYANNA GLANDON MRN: [id number] Date of Birth: 03-04-1976 Today's Date: 10/18/2020 Time: SLP Start Time (ACUTE ONLY): 1045 -SLP Stop Time (ACUTE ONLY): 1100 SLP Time Calculation (min) (ACUTE ONLY): 15 min Past Medical History: Past Medical History: Diagnosis Date . Anemia  . Anxiety  . Eczema  . ESRD (end stage renal disease) ([place])   [place] - TTHSAT- [place] . Exertional shortness of breath  . GERD (gastroesophageal reflux disease)  . Headache  . History of blood transfusion  . History of kidney stones   passed . Hypertension  .  Ischemic cardiomyopathy   by echo 2014 . Neuropathy  . Pneumonia 09/2016 . Type II diabetes mellitus ([place]) 1995 Past Surgical History: Past Surgical History: Procedure Laterality Date . ABDOMINAL AORTOGRAM W/LOWER EXTREMITY Right 04/03/2020  Procedure: ABDOMINAL AORTOGRAM W/LOWER EXTREMITY;  Surgeon: Marty Heck, MD;  Location: [facility];  Service: Cardiovascular;  Laterality: Right; . AMPUTATION Left 02/06/2013  Procedure: AMPUTATION LEFT GREAT TOE;  Surgeon: Wylene Simmer, MD;  Location: [facility];  Service: Orthopedics;  Laterality: Left; . AMPUTATION Left 06/24/2013  Procedure: AMPUTATION BELOW KNEE ;  Surgeon: Wylene Simmer, MD;  Location: [facility];  Service: Orthopedics;  Laterality: Left; . AMPUTATION Right 04/10/2020  Procedure: RIGHT AMPUTATION BELOW KNEE;  Surgeon: Waynetta Sandy, MD;  Location: [facility];  Service: Vascular;  Laterality: Right; . AV FISTULA PLACEMENT Right 02/08/2017  Procedure: CREATION OF RIGHT ARM  BASILIC VEIN TO BRACHIAL ARTERY ARTERIOVENOUS (AV) FISTULA;  Surgeon: Rosetta Posner, MD;  Location: [facility];  Service: Vascular;  Laterality: Right; . AV FISTULA PLACEMENT Left 09/10/2017  Procedure: INSERTION OF ARTERIOVENOUS (AV) GORE-TEX GRAFT  LEFT UPPER ARM;  Surgeon: Angelia Mould, MD;  Location: [facility];  Service: Vascular;  Laterality: Left; . BASCILIC VEIN TRANSPOSITION Right 04/14/2017  Procedure: BASCILIC VEIN TRANSPOSITION-RIGHT 2ND STAGE;  Surgeon: Rosetta Posner, MD;  Location: [facility];  Service: Vascular;  Laterality: Right; . CESAREAN SECTION  10/18/2006 . INSERTION OF DIALYSIS CATHETER   . PERIPHERAL VASCULAR BALLOON ANGIOPLASTY Right 04/03/2020  Procedure: PERIPHERAL VASCULAR BALLOON ANGIOPLASTY;  Surgeon: Marty Heck, MD;  Location: [facility];  Service: Cardiovascular;  Laterality: Right;  SFA . WOUND DEBRIDEMENT Right 04/04/2020  Procedure: RIGHT FOOT DEBRIDEMENT;  Surgeon: Waynetta Sandy, MD;  Location: [facility];  Service: Vascular;   Laterality: Right; HPI: 45 y.o. female,  with a past medical history significant for ESRD on dialysis (T, Th, S), HTN, stroke,T2DM, PAD s/p left BKA, ischemic cardiomyopathy presenting with diarrhea. CT scan of the abdomen and pelvis with contrast significant for focus of pneumonia in the right posterior lung base and degree of interstitial edema in the lungs. Pt reports concern about aspiration. Pt followed by SLP services during Dec 2018 admission for CVA (infarct left pons, right parietal lobe, right corpus callosum/cingulate gyrus) - was found to have a mild dysphagia/dysarthria.  She reports increased episodes of choking with meals PTA. Last MBS completed on 01/28/2018 showing sensed peentration of thin liquids. Pt has ongoing spastic dysarthria and hopes for improvement, she reports she had an SLP working with her at home as recently as 6 months ago.  No data recorded Assessment / Plan / Recommendation CHL IP CLINICAL IMPRESSIONS 10/18/2020 Clinical Impression Pt demonstrates variable ability to time and execute oral and pharyngeal movements during swallowing, at worst leading to trace frank aspiration before the swallow. Oral phase is characterized by slow oral manipulation of bolus, with increased time for posterior propulsion if pt is given a motor command to execute.  Primarily problem is premature spillage causing trace silent penetration. A verbal cue to orally hold the bolus prior to starting the swallow slows down the process, but decreases instance of penetration. Boluses may reach the pharynx with timely laryngeal protection, or occasionally spill and pool in the pyriforms prior to swallow initiation. Overall, pts function is stable, we discussed compensatory strategies, pt to continue current diet. No acute SLP f/u needed, pt would benefit from f/u with [facility] SLP.  SLP Visit Diagnosis Dysphagia, oropharyngeal phase (R13.12) Attention and concentration deficit following -- Frontal lobe and executive  function deficit following -- Impact on safety and function Mild aspiration risk   CHL IP TREATMENT RECOMMENDATION 10/18/2020 Treatment Recommendations No treatment recommended at this time   Prognosis 09/11/2017 Prognosis for Safe Diet Advancement Good Barriers to Reach Goals Cognitive deficits Barriers/Prognosis Comment -- CHL IP DIET RECOMMENDATION 10/18/2020 SLP Diet Recommendations Regular solids;Thin liquid Liquid Administration via Cup;Straw Medication Administration -- Compensations -- Postural Changes --   CHL IP OTHER RECOMMENDATIONS 10/18/2020 Recommended Consults -- Oral Care Recommendations Oral care BID Other Recommendations --   CHL IP FOLLOW UP RECOMMENDATIONS 10/18/2020 Follow up Recommendations 24 hour supervision/assistance;Home health SLP   CHL IP FREQUENCY AND DURATION 04/11/2020 Speech Therapy Frequency (ACUTE ONLY) min 2x/week Treatment Duration --      CHL IP ORAL PHASE 10/18/2020 Oral Phase Impaired Oral - Pudding Teaspoon -- Oral - Pudding Cup -- Oral - Honey Teaspoon -- Oral - Honey Cup -- Oral - Nectar Teaspoon -- Oral - Nectar Cup -- Oral - Nectar Straw -- Oral - Thin Teaspoon -- Oral - Thin Cup Premature spillage;Delayed oral transit;Holding of bolus Oral - Thin Straw Premature spillage;Delayed oral transit;Holding of bolus Oral - Puree Delayed oral transit;Holding of bolus Oral -  Mech Soft -- Oral - Regular Delayed oral transit;Holding of bolus Oral - Multi-Consistency -- Oral - Pill Premature spillage;Delayed oral transit;Holding of bolus Oral Phase - Comment --  CHL IP PHARYNGEAL PHASE 10/18/2020 Pharyngeal Phase Impaired Pharyngeal- Pudding Teaspoon -- Pharyngeal -- Pharyngeal- Pudding Cup -- Pharyngeal -- Pharyngeal- Honey Teaspoon -- Pharyngeal -- Pharyngeal- Honey Cup -- Pharyngeal -- Pharyngeal- Nectar Teaspoon -- Pharyngeal -- Pharyngeal- Nectar Cup -- Pharyngeal -- Pharyngeal- Nectar Straw -- Pharyngeal -- Pharyngeal- Thin Teaspoon -- Pharyngeal -- Pharyngeal- Thin Cup Delayed  swallow initiation-vallecula;Delayed swallow initiation-pyriform sinuses;Penetration/Aspiration before swallow Pharyngeal Material enters airway, CONTACTS cords and not ejected out;Material does not enter airway Pharyngeal- Thin Straw Delayed swallow initiation-pyriform sinuses;Delayed swallow initiation-vallecula;Penetration/Aspiration before swallow Pharyngeal Material enters airway, CONTACTS cords and not ejected out;Material does not enter airway Pharyngeal- Puree Delayed swallow initiation-vallecula;Delayed swallow initiation-pyriform sinuses Pharyngeal -- Pharyngeal- Mechanical Soft Delayed swallow initiation-vallecula;Delayed swallow initiation-pyriform sinuses Pharyngeal -- Pharyngeal- Regular -- Pharyngeal -- Pharyngeal- Multi-consistency -- Pharyngeal -- Pharyngeal- Pill -- Pharyngeal -- Pharyngeal Comment --  CHL IP CERVICAL ESOPHAGEAL PHASE 01/28/2018 Cervical Esophageal Phase WFL Pudding Teaspoon -- Pudding Cup -- Honey Teaspoon -- Honey Cup -- Nectar Teaspoon -- Nectar Cup -- Nectar Straw -- Thin Teaspoon -- Thin Cup -- Thin Straw -- Puree -- Mechanical Soft -- Regular -- Multi-consistency -- Pill -- Cervical Esophageal Comment -- Herbie Baltimore, MA CCC-SLP Acute Rehabilitation Services Pager [phone] Office [phone] Lynann Beaver 10/18/2020, 12:06 PM              Medications: . azithromycin 500 mg (10/18/20 1837)  . cefTRIAXone (ROCEPHIN)  IV 2 g (10/18/20 1745)   . amLODipine  10 mg Oral Daily  . atorvastatin  40 mg Oral Daily  . Chlorhexidine Gluconate Cloth  6 each Topical Q0600  . clopidogrel  75 mg Oral Q breakfast  . doxercalciferol  4 mcg Intravenous Q M,W,F-HD  . feeding supplement (NEPRO CARB STEADY)  237 mL Oral BID BM  . insulin aspart  0-6 Units Subcutaneous TID WC  . metoprolol succinate  12.5 mg Oral Daily  . multivitamin  1 tablet Oral QHS  . pantoprazole  40 mg Oral QAC breakfast  . sevelamer carbonate  2,400 mg Oral TID WC  . sodium chloride flush  3  mL Intravenous Q12H     Dialysis Orders: Dialysis Orders:[facility] MWF 4 hrs 180NRe 400/80094kg 2.0 K/ 2.0 Ca UFP 2 L AVG -Heparin3000 units IV TIW -Hectorol20mcg IV TIW -Mircera15mcg IV q 2 weeks (Last dose 10/04/2020) -Venofer 100 mg IV X 5 doses 1/5 doses given  Assessment/Plan: 1. Volume overload: She appears to have lost body weight, missed and truncated treatments. Lower volume as tolerated. BNP not a reliable marker in ESRD. 2. Acute hypoxic respiratory failure: Believe primary issue is volume overload D/T truncated and missed HD. She is currently comfortable on RA. Also being treated for PNA per primary.  3. Aspiration PNA. WBC 5.0, afebrile. ABX per primary. COVID negative.  4.  ESRD -  MWF, last HD 01/24-signed off early. She missed HD 10/16/20. HD today. Use 2.0 K bath. Usual heparin.  5.  Hypertension/volume  -Volume as noted above. Hypertensive on admission. Resumed amlodipine and metoprolol. BP more stable.  6.  Anemia  - HGB 10.1 Next dose due 10/18/2020. Will give Aranesp 40 mcg IV with HD today.  7.  Metabolic bone disease -  C Ca 9.76 PO4 elevated. Chronic issue with noncompliance with binders. Continue binders/VDRA.  8.  Nutrition - Albumin 3.3.  Renal/Carb mod diet with nepro, renal vit. Asking for regular diet which she eats at home. K+ OK. Regular diet.  9.  DM-per primary 10.  H/O CVA. Speech study today. No treatment recommended.  11.  Social issues: Asking again for SNF placement. Will enter social work consult.   Iori Gigante H. Haskel Dewalt NP-C 10/19/2020, 9:18 AM  [employer] [phone]

## 2020-10-20 DIAGNOSIS — J9601 Acute respiratory failure with hypoxia: Secondary | ICD-10-CM | POA: Diagnosis not present

## 2020-10-20 LAB — CBC WITH DIFFERENTIAL/PLATELET
Abs Immature Granulocytes: 0.03 10*3/uL (ref 0.00–0.07)
Basophils Absolute: 0 10*3/uL (ref 0.0–0.1)
Basophils Relative: 0 %
Eosinophils Absolute: 0.1 10*3/uL (ref 0.0–0.5)
Eosinophils Relative: 2 %
HCT: 35.4 % — ABNORMAL LOW (ref 36.0–46.0)
Hemoglobin: 10.3 g/dL — ABNORMAL LOW (ref 12.0–15.0)
Immature Granulocytes: 1 %
Lymphocytes Relative: 21 %
Lymphs Abs: 1 10*3/uL (ref 0.7–4.0)
MCH: 26.3 pg (ref 26.0–34.0)
MCHC: 29.1 g/dL — ABNORMAL LOW (ref 30.0–36.0)
MCV: 90.5 fL (ref 80.0–100.0)
Monocytes Absolute: 0.6 10*3/uL (ref 0.1–1.0)
Monocytes Relative: 13 %
Neutro Abs: 2.9 10*3/uL (ref 1.7–7.7)
Neutrophils Relative %: 63 %
Platelets: 85 10*3/uL — ABNORMAL LOW (ref 150–400)
RBC: 3.91 MIL/uL (ref 3.87–5.11)
RDW: 20.9 % — ABNORMAL HIGH (ref 11.5–15.5)
WBC: 4.7 10*3/uL (ref 4.0–10.5)
nRBC: 0 % (ref 0.0–0.2)

## 2020-10-20 LAB — RENAL FUNCTION PANEL
Albumin: 3.2 g/dL — ABNORMAL LOW (ref 3.5–5.0)
Anion gap: 15 (ref 5–15)
BUN: 31 mg/dL — ABNORMAL HIGH (ref 6–20)
CO2: 20 mmol/L — ABNORMAL LOW (ref 22–32)
Calcium: 8.8 mg/dL — ABNORMAL LOW (ref 8.9–10.3)
Chloride: 100 mmol/L (ref 98–111)
Creatinine, Ser: 7.81 mg/dL — ABNORMAL HIGH (ref 0.44–1.00)
GFR, Estimated: 6 mL/min — ABNORMAL LOW (ref >60)
Glucose, Bld: 111 mg/dL — ABNORMAL HIGH (ref 70–99)
Phosphorus: 4.7 mg/dL — ABNORMAL HIGH (ref 2.5–4.6)
Potassium: 5.4 mmol/L — ABNORMAL HIGH (ref 3.5–5.1)
Sodium: 135 mmol/L (ref 135–145)

## 2020-10-20 LAB — BASIC METABOLIC PANEL
Anion gap: 15 (ref 5–15)
BUN: 28 mg/dL — ABNORMAL HIGH (ref 6–20)
CO2: 21 mmol/L — ABNORMAL LOW (ref 22–32)
Calcium: 8.6 mg/dL — ABNORMAL LOW (ref 8.9–10.3)
Chloride: 100 mmol/L (ref 98–111)
Creatinine, Ser: 7.49 mg/dL — ABNORMAL HIGH (ref 0.44–1.00)
GFR, Estimated: 6 mL/min — ABNORMAL LOW (ref >60)
Glucose, Bld: 162 mg/dL — ABNORMAL HIGH (ref 70–99)
Potassium: 4.3 mmol/L (ref 3.5–5.1)
Sodium: 136 mmol/L (ref 135–145)

## 2020-10-20 LAB — GLUCOSE, CAPILLARY
Glucose-Capillary: 91 mg/dL (ref 70–99)
Glucose-Capillary: 138 mg/dL — ABNORMAL HIGH (ref 70–99)
Glucose-Capillary: 105 mg/dL — ABNORMAL HIGH (ref 70–99)

## 2020-10-20 NOTE — Evaluation (Signed)
Physical Therapy Evaluation Patient Details Name: Karen [name] MRN: [id number] DOB: 1975/11/13 Today's Date: 10/20/2020   History of Present Illness  45 year old female with past medical history of ESRD on HD (MWF), hypertension, CVA with residual dysarthria, cardiomyopathy, type 2 diabetes with peripheral neuropathy, s/p bilateral BKA. Admitted 1/27 with acute hypoxic respiratory failure, HTN urgency, aspiration pneumonia, diarrhea.  Clinical Impression  Pt admitted with above diagnosis. Eager to participate with physical therapy. Tolerated transfer training today with posterior scoot, up to mod assist to help scoot with bed pad. She does a good job of balancing and shifting away from midline and back to center. UEs week limiting independence with posterior scoot technique. She has not been taught or achieved full independence with scoot board at home and apparently has not had any further HHPT. Will benefit from SNF level rehab to improve safety with mobility. Pt currently with functional limitations due to the deficits listed below (see PT Problem List). Pt will benefit from skilled PT to increase their independence and safety with mobility to allow discharge to the venue listed below.       Follow Up Recommendations SNF    Equipment Recommendations  None recommended by PT    Recommendations for Other Services       Precautions / Restrictions Precautions Precautions: Fall Restrictions Weight Bearing Restrictions: No      Mobility  Bed Mobility Overal bed mobility: Needs Assistance Bed Mobility: Sidelying to Sit;Sit to Sidelying   Sidelying to sit: Min guard     Sit to sidelying: Min guard General bed mobility comments: Min guard for safety, cues for technique. Practiced multile times Rt and Lt side.    Transfers Overall transfer level: Needs assistance Equipment used: None Transfers: Comptroller transfers: Mod assist    General transfer comment: Pt able to practice a/p scoot technique from bed to chair. Required up to mod assist for final 50% of transfer as her arms fatigued. min guard level with turning and is able to hold herself up in midline as well as weight shift Lt and right to scoot backwards. Cues for hand placement although fairly week and unable to clear buttocks from bed, even with arm rests present later in transfer.  Ambulation/Gait                Stairs            Wheelchair Mobility    Modified Rankin (Stroke Patients Only)       Balance Overall balance assessment: Needs assistance Sitting-balance support: No upper extremity supported Sitting balance-Leahy Scale: Good Sitting balance - Comments: able to weight shift long sitting on bed. Lt and right down onto elbows and back to midline bil.                                     Pertinent Vitals/Pain Pain Assessment: No/denies pain    Home Living Family/patient expects to be discharged to:: Skilled nursing facility Living Arrangements: Children Available Help at Discharge: Family;Personal care attendant;Available PRN/intermittently Type of Home: House Home Access: Level entry     Home Layout: One level Home Equipment: Wheelchair - manual;Shower seat;Hospital bed      Prior Function Level of Independence: Needs assistance   Gait / Transfers Assistance Needed: non ambulatory  ADL's / Homemaking Assistance Needed: Needs assist with ADLs  Comments: Has aide Monday-Sat 7a-3p,  sunday 3 hrs.     Hand Dominance   Dominant Hand: Left    Extremity/Trunk Assessment   Upper Extremity Assessment Upper Extremity Assessment: Defer to OT evaluation    Lower Extremity Assessment Lower Extremity Assessment: RLE deficits/detail;LLE deficits/detail RLE Deficits / Details: bka LLE Deficits / Details: bka       Communication   Communication: Expressive difficulties  Cognition Arousal/Alertness:  Awake/alert Behavior During Therapy: WFL for tasks assessed/performed Overall Cognitive Status: Within Functional Limits for tasks assessed                                        General Comments      Exercises     Assessment/Plan    PT Assessment Patient needs continued PT services  PT Problem List Decreased strength;Decreased activity tolerance;Decreased balance;Decreased mobility;Obesity;Cardiopulmonary status limiting activity       PT Treatment Interventions DME instruction;Functional mobility training;Therapeutic activities;Therapeutic exercise;Balance training;Neuromuscular re-education;Patient/family education;Wheelchair mobility training    PT Goals (Current goals can be found in the Care Plan section)  Acute Rehab PT Goals Patient Stated Goal: Get rehab before going home so she can be more independent. PT Goal Formulation: With patient Time For Goal Achievement: 11/03/20 Potential to Achieve Goals: Good    Frequency Min 3X/week   Barriers to discharge Decreased caregiver support limited support    Co-evaluation               AM-PAC PT "6 Clicks" Mobility  Outcome Measure Help needed turning from your back to your side while in a flat bed without using bedrails?: A Little Help needed moving from lying on your back to sitting on the side of a flat bed without using bedrails?: A Little Help needed moving to and from a bed to a chair (including a wheelchair)?: A Lot Help needed standing up from a chair using your arms (e.g., wheelchair or bedside chair)?: Total Help needed to walk in hospital room?: Total Help needed climbing 3-5 steps with a railing? : Total 6 Click Score: 11    End of Session Equipment Utilized During Treatment: Gait belt Activity Tolerance: Patient tolerated treatment well Patient left: in chair;with call bell/phone within reach Nurse Communication: Mobility status;Other (comment) (anterior transfer from chair to  bed) PT Visit Diagnosis: Muscle weakness (generalized) (M62.81);Difficulty in walking, not elsewhere classified (R26.2)    Time: 1308-6578 PT Time Calculation (min) (ACUTE ONLY): 35 min   Charges:   PT Evaluation $PT Eval Moderate Complexity: 1 Mod PT Treatments $Therapeutic Activity: 8-22 mins        Elayne Snare, PT, DPT  Karen [name] 10/20/2020, 3:39 PM

## 2020-10-20 NOTE — Progress Notes (Signed)
[facility] Progress Note   Subjective: Seen in room, trying to clean herself after BM. Says this is what she does at home. Needs SNF placement. She is agreeable with this for the moment but has said she wanted to go to SNF then refused in past. On RA, denies SOB.     Objective Vitals:   10/19/20 1235 10/19/20 1835 10/19/20 2027 10/20/20 0425  BP: (!) 156/78 (!) 164/87 (!) 174/86 (!) 141/83  Pulse:  84 84 83  Resp: 18  18 18   Temp: 98.2 F (36.8 C)  99 F (37.2 C) 98.6 F (37 C)  TempSrc: Oral  Oral Oral  SpO2: 92%  96% 93%  Weight:       Physical Exam General: Chronically ill appearing female in NAD Heart: S1,S2 RRR Lungs: Bilateral breath sounds with few scattered bibasilar crackles. No wheezing, no WOB.  Abdomen: Obese, active BS Extremities:Trace stump edema bilaterally Dialysis Access: AVG + bruit   Additional Objective Labs: Basic Metabolic Panel: Recent Labs  Lab 10/18/20 0540 10/19/20 1015 10/20/20 1038  NA 138 137 136  K 3.9 3.4* 4.3  CL 97* 97* 100  CO2 27 24 21*  GLUCOSE 102* 106* 162*  BUN 30* 26* 28*  CREATININE 10.19* 8.76* 7.49*  CALCIUM 9.2 8.7* 8.6*  PHOS 6.5*  --   --    Liver Function Tests: Recent Labs  Lab 10/17/20 0653 10/18/20 0540  AST 14*  --   ALT 9  --   ALKPHOS 85  --   BILITOT 0.8  --   PROT 7.3  --   ALBUMIN 3.5 3.3*   Recent Labs  Lab 10/17/20 0653  LIPASE 16   CBC: Recent Labs  Lab 10/17/20 0653 10/18/20 0540 10/19/20 1015 10/20/20 1038  WBC 4.8 5.0 4.6 4.7  NEUTROABS  --   --   --  2.9  HGB 10.6* 10.1* 9.7* 10.3*  HCT 35.8* 34.2* 32.3* 35.4*  MCV 87.1 86.6 85.0 90.5  PLT 89* 94* 85* 85*   Blood Culture    Component Value Date/Time   SDES BLOOD LEFT ANTECUBITAL 04/08/2020 1028   SPECREQUEST  04/08/2020 1028    BOTTLES DRAWN AEROBIC ONLY Blood Culture results may not be optimal due to an inadequate volume of blood received in culture bottles   CULT  04/08/2020 1028    NO GROWTH 5  DAYS Performed at [facility], [place] [address] [place], [place] [address]    REPTSTATUS 04/13/2020 FINAL 04/08/2020 1028    Cardiac Enzymes: No results for input(s): CKTOTAL, CKMB, CKMBINDEX, TROPONINI in the last 168 hours. CBG: Recent Labs  Lab 10/19/20 0655 10/19/20 0800 10/19/20 1615 10/20/20 0822 10/20/20 1259  GLUCAP 100* 106* 117* 91 138*   Iron Studies: No results for input(s): IRON, TIBC, TRANSFERRIN, FERRITIN in the last 72 hours. @lablastinr3 @ Studies/Results: No results found. Medications: . cefTRIAXone (ROCEPHIN)  IV 2 g (10/19/20 1846)   . amLODipine  10 mg Oral Daily  . atorvastatin  40 mg Oral Daily  . azithromycin  500 mg Oral Daily  . Chlorhexidine Gluconate Cloth  6 each Topical Q0600  . clopidogrel  75 mg Oral Q breakfast  . doxercalciferol  4 mcg Intravenous Q M,W,F-HD  . feeding supplement (NEPRO CARB STEADY)  237 mL Oral BID BM  . insulin aspart  0-6 Units Subcutaneous TID WC  . metoprolol succinate  12.5 mg Oral Daily  . multivitamin  1 tablet Oral QHS  . pantoprazole  40 mg Oral QAC breakfast  . sevelamer carbonate  2,400 mg Oral TID WC  . sodium chloride flush  3 mL Intravenous Q12H    Dialysis Orders:Dialysis Orders:Agenda MWF 4 hrs 180NRe 400/80094kg 2.0 K/ 2.0Ca UFP 2 L AVG -Heparin3000 units IV TIW -Hectorol25mcg IV TIW -Mircera125mcg IV q 2 weeks (Last dose01/14/2022) -Venofer 100 mg IV X 5 doses 1/5 doses given  Assessment/Plan: 1. Volume overload: She appears to have lost body weight, missed and truncated treatments. Lower volume as tolerated. BNP not a reliable marker in ESRD. 3 liters removed in HD 10/19/20. Continue lowering volume as tolerated.  2. Acute hypoxic respiratory failure: Believe primary issue is volume overload D/T truncated and missed HD. She is currently comfortable on RA. Also being treated for PNA per primary.  3. Aspiration PNA. WBC 5.0, afebrile. ABX per primary. COVID negative. 4. ESRD -  MWF. Continues pattern of missed and truncated treatments. Last HD off schedule. Next HD tomorrow on schedule.  5. Hypertension/volume -Volume as noted above. Hypertensive on admission. Resumed amlodipine and metoprolol. BP more stable. Net UF 3 liters with HD 01/29. No post wt. Order daily wts. UF as tolerated tomorrow.  6. Anemia - HGB 10.1 Next dose due 10/18/2020. Rec'd Aranesp 40 mcg IV with HD 10/19/20 7. Metabolic bone disease - C Ca 9.76 PO4 elevated. Chronic issue with noncompliance with binders. Continue binders/VDRA.Checking PO4 today.  8. Nutrition - Albumin 3.3. Renal/Carb mod diet with nepro, renal vit. Asking for regular diet which she eats at home. K+ OK. Regular diet.  9. DM-per primary 10. H/O CVA. Speech study today. No treatment recommended.  11.  Social issues: Asking again for SNF placement. Will enter social work consult.   Andrea Colglazier H. Yamaris Cummings NP-C 10/20/2020, 1:50 PM  [employer] [phone]

## 2020-10-20 NOTE — Progress Notes (Signed)
PROGRESS NOTE    Karen [name]    Code Status: Full Code  WJX:[id number] DOB: 08-06-1976 DOA: 10/17/2020 LOS: 3 days  PCP: Karen [name], No Pcp Per CC:  Chief Complaint  Karen [name] presents with  . Diarrhea       Hospital Summary   This is a 45 year old female with past medical history of ESRD on HD (MWF), hypertension, CVA with residual dysarthria, cardiomyopathy, type 2 diabetes with peripheral neuropathy, s/p bilateral BKA who presented to the ED on 1/27 with 2 weeks of nonbloody diarrhea with associated midline abdominal pain also noted that she had a cough during this period of time without any significant fevers.  Reported she feels like food goes down the wrong way when eating.  Acute abdominal series was significant for vascular congestion with mild interstitial edema and normal gas pattern.  C. difficile and GI panel were negative.  CT abdomen pelvis with contrast significant for right posterior lung base pneumonia and interstitial edema in the lungs.  She was started on Rocephin and azithromycin for aspiration pneumonia.  Nephrology was consulted for ESRD    A & P   Principal Problem:   Acute respiratory failure with hypoxia ([place]) Active Problems:   PAD (peripheral artery disease) (H[facility])   S/P BKA (below knee amputation) unilateral (H[facility])   Anemia of chronic disease   Diarrhea   Type 2 diabetes mellitus with hyperglycemia ([place])   Hypertensive urgency   Aspiration pneumonia ([place])   1. Acute hypoxic respiratory failure likely secondary to volume overload and aspiration pneumonia a. Currently on room air and without any wheezing  2. Aspiration pneumonia a. SLP eval consistent with aspiration on assessment and barium swallow b. Complete 5 days antibiotics, currently on ceftriaxone and azithromycin  3. Volume overload in the setting of ESRD on HD (MWF), stable a. Missed HD on 10/16/2020, possibly the inciting event? b. Has a pattern of missed and truncated treatments c. On  calcitriol and Sensipar d. Elevated BNP unreliable and ESRD e. HD tomorrow f. Nephrology on board for HD, treated today  4. Hypertension, stable a. Continue amlodipine and metoprolol  5. Anemia a. Given Aranesp per nephro with HD 1/29  6. Type 2 diabetes, controlled a. Continue sliding scale  7. Nonbloody diarrhea of unknown etiology, resolved a. Possibly from viral syndrome?  8. PAD a. On statin, Plavix, aspirin  9. Chronic thrombocytopenia   DVT prophylaxis: Place and maintain sequential compression device Start: 10/17/20 1731   Family Communication: Karen [name] updated at bedside today  Disposition Plan: Likely medically stable for discharge tomorrow, pending nephrology signoff and SNF availability.   Status is: Inpatient  Remains inpatient appropriate because:IV treatments appropriate due to intensity of illness or inability to take PO and Inpatient level of care appropriate due to severity of illness   Dispo: The Karen [name] is from: Home              Anticipated d/c is to: SNF              Anticipated d/c date is: 1 day              Karen [name] currently is not medically stable to d/c.   Difficult to place Karen [name] No           Pressure injury documentation    None  Consultants  Nephrology   Procedures  HD   Antibiotics   Anti-infectives (From admission, onward)   Start     Dose/Rate Route Frequency Ordered Stop  10/19/20 1700  azithromycin (ZITHROMAX) tablet 500 mg        500 mg Oral Daily 10/19/20 1417 10/23/20 1659   10/17/20 1700  cefTRIAXone (ROCEPHIN) 2 g in sodium chloride 0.9 % 100 mL IVPB        2 g 200 mL/hr over 30 Minutes Intravenous Every 24 hours 10/17/20 1656 10/22/20 1659   10/17/20 1700  azithromycin (ZITHROMAX) 500 mg in sodium chloride 0.9 % 250 mL IVPB  Status:  Discontinued        500 mg 250 mL/hr over 60 Minutes Intravenous Every 24 hours 10/17/20 1656 10/19/20 1417   10/17/20 1630  ceFEPIme (MAXIPIME) 1 g in sodium chloride 0.9 %  100 mL IVPB  Status:  Discontinued        1 g 200 mL/hr over 30 Minutes Intravenous Every 24 hours 10/17/20 1600 10/17/20 1629   10/17/20 1630  azithromycin (ZITHROMAX) 500 mg in sodium chloride 0.9 % 250 mL IVPB  Status:  Discontinued        500 mg 250 mL/hr over 60 Minutes Intravenous Every 24 hours 10/17/20 1600 10/17/20 1629        Subjective   Reports that she is short of breath today.  Denies any chest pain or any other issues.  Karen [name] refused her telemetry overnight  Objective   Vitals:   10/19/20 1235 10/19/20 1835 10/19/20 2027 10/20/20 0425  BP: (!) 156/78 (!) 164/87 (!) 174/86 (!) 141/83  Pulse:  84 84 83  Resp: 18  18 18   Temp: 98.2 F (36.8 C)  99 F (37.2 C) 98.6 F (37 C)  TempSrc: Oral  Oral Oral  SpO2: 92%  96% 93%  Weight:       No intake or output data in the 24 hours ending 10/20/20 1713 Filed Weights   10/19/20 0923  Weight: 95.2 kg    Examination:  Physical Exam Vitals and nursing note reviewed.  Constitutional:      General: She is not in acute distress.    Appearance: She is not toxic-appearing.  HENT:     Head: Normocephalic.     Mouth/Throat:     Mouth: Mucous membranes are moist.  Eyes:     Conjunctiva/sclera: Conjunctivae normal.  Cardiovascular:     Rate and Rhythm: Normal rate and regular rhythm.  Pulmonary:     Effort: No respiratory distress.     Breath sounds: No wheezing or rales.     Comments: Poor inspiratory effort and seems to be holding her breath intentionally  Abdominal:     General: There is no distension.     Tenderness: There is no abdominal tenderness.  Musculoskeletal:        General: No swelling or tenderness.     Comments: S/p bilateral BKA  Neurological:     Mental Status: She is alert. Mental status is at baseline.     Data Reviewed: I have personally reviewed following labs and imaging studies  CBC: Recent Labs  Lab 10/17/20 0653 10/18/20 0540 10/19/20 1015 10/20/20 1038  WBC 4.8 5.0 4.6 4.7   NEUTROABS  --   --   --  2.9  HGB 10.6* 10.1* 9.7* 10.3*  HCT 35.8* 34.2* 32.3* 35.4*  MCV 87.1 86.6 85.0 90.5  PLT 89* 94* 85* 85*   Basic Metabolic Panel: Recent Labs  Lab 10/17/20 0653 10/18/20 0540 10/19/20 1015 10/20/20 1038  NA 141 138 137 136  K 3.5 3.9 3.4* 4.3  CL 98 97* 97* 100  CO2 27 27 24  21*  GLUCOSE 110* 102* 106* 162*  BUN 23* 30* 26* 28*  CREATININE 9.52* 10.19* 8.76* 7.49*  CALCIUM 9.6 9.2 8.7* 8.6*  PHOS  --  6.5*  --   --    GFR: CrCl cannot be calculated (Unknown ideal weight.). Liver Function Tests: Recent Labs  Lab 10/17/20 0653 10/18/20 0540  AST 14*  --   ALT 9  --   ALKPHOS 85  --   BILITOT 0.8  --   PROT 7.3  --   ALBUMIN 3.5 3.3*   Recent Labs  Lab 10/17/20 0653  LIPASE 16   No results for input(s): AMMONIA in the last 168 hours. Coagulation Profile: No results for input(s): INR, PROTIME in the last 168 hours. Cardiac Enzymes: No results for input(s): CKTOTAL, CKMB, CKMBINDEX, TROPONINI in the last 168 hours. BNP (last 3 results) No results for input(s): PROBNP in the last 8760 hours. HbA1C: Recent Labs    10/18/20 0540  HGBA1C 5.3   CBG: Recent Labs  Lab 10/19/20 0655 10/19/20 0800 10/19/20 1615 10/20/20 0822 10/20/20 1259  GLUCAP 100* 106* 117* 91 138*   Lipid Profile: No results for input(s): CHOL, HDL, LDLCALC, TRIG, CHOLHDL, LDLDIRECT in the last 72 hours. Thyroid Function Tests: No results for input(s): TSH, T4TOTAL, FREET4, T3FREE, THYROIDAB in the last 72 hours. Anemia Panel: No results for input(s): VITAMINB12, FOLATE, FERRITIN, TIBC, IRON, RETICCTPCT in the last 72 hours. Sepsis Labs: Recent Labs  Lab 10/18/20 0540  PROCALCITON 0.24    Recent Results (from the past 240 hour(s))  Gastrointestinal Panel by PCR , Stool     Status: None   Collection Time: 10/17/20  8:53 AM   Specimen: Stool  Result Value Ref Range Status   Campylobacter species NOT DETECTED NOT DETECTED Final   Plesimonas  shigelloides NOT DETECTED NOT DETECTED Final   Salmonella species NOT DETECTED NOT DETECTED Final   Yersinia enterocolitica NOT DETECTED NOT DETECTED Final   Vibrio species NOT DETECTED NOT DETECTED Final   Vibrio cholerae NOT DETECTED NOT DETECTED Final   Enteroaggregative E coli (EAEC) NOT DETECTED NOT DETECTED Final   Enteropathogenic E coli (EPEC) NOT DETECTED NOT DETECTED Final   Enterotoxigenic E coli (ETEC) NOT DETECTED NOT DETECTED Final   Shiga like toxin producing E coli (STEC) NOT DETECTED NOT DETECTED Final   Shigella/Enteroinvasive E coli (EIEC) NOT DETECTED NOT DETECTED Final   Cryptosporidium NOT DETECTED NOT DETECTED Final   Cyclospora cayetanensis NOT DETECTED NOT DETECTED Final   Entamoeba histolytica NOT DETECTED NOT DETECTED Final   Giardia lamblia NOT DETECTED NOT DETECTED Final   Adenovirus F40/41 NOT DETECTED NOT DETECTED Final   Astrovirus NOT DETECTED NOT DETECTED Final   Norovirus GI/GII NOT DETECTED NOT DETECTED Final   Rotavirus A NOT DETECTED NOT DETECTED Final   Sapovirus (I, II, IV, and V) NOT DETECTED NOT DETECTED Final    Comment: Performed at [facility], [address] [place], [place] [address]  C Difficile Quick Screen w PCR reflex     Status: None   Collection Time: 10/17/20  8:54 AM   Specimen: Stool  Result Value Ref Range Status   C Diff antigen NEGATIVE NEGATIVE Final   C Diff toxin NEGATIVE NEGATIVE Final   C Diff interpretation No C. difficile detected.  Final    Comment: Performed at [facility], [place] [address] [place], [place] [address]  SARS Coronavirus 2 by RT PCR (hospital order, performed in  [facility] hospital lab) Nasopharyngeal Nasopharyngeal Swab     Status: None   Collection Time: 10/17/20  4:16 PM   Specimen: Nasopharyngeal Swab  Result Value Ref Range Status   SARS Coronavirus 2 NEGATIVE NEGATIVE Final    Comment: (NOTE) SARS-CoV-2 target nucleic acids are NOT DETECTED.  The SARS-CoV-2 RNA is generally  detectable in upper and lower respiratory specimens during the acute phase of infection. The lowest concentration of SARS-CoV-2 viral copies this assay can detect is 250 copies / mL. A negative result does not preclude SARS-CoV-2 infection and should not be used as the sole basis for treatment or other Karen [name] management decisions.  A negative result may occur with improper specimen collection / handling, submission of specimen other than nasopharyngeal swab, presence of viral mutation(s) within the areas targeted by this assay, and inadequate number of viral copies (<250 copies / mL). A negative result must be combined with clinical observations, Karen [name] history, and epidemiological information.  Fact Sheet for Patients:   StrictlyIdeas.no  Fact Sheet for Healthcare Providers: BankingDealers.co.za  This test is not yet approved or  cleared by the [place] FDA and has been authorized for detection and/or diagnosis of SARS-CoV-2 by FDA under an Emergency Use Authorization (EUA).  This EUA will remain in effect (meaning this test can be used) for the duration of the COVID-19 declaration under Section 564(b)(1) of the Act, 21 U.S.C. section 360bbb-3(b)(1), unless the authorization is terminated or revoked sooner.  Performed at [facility], [place] [address], [place], [place] [address]          Radiology Studies: No results found.      Scheduled Meds: . amLODipine  10 mg Oral Daily  . atorvastatin  40 mg Oral Daily  . azithromycin  500 mg Oral Daily  . Chlorhexidine Gluconate Cloth  6 each Topical Q0600  . clopidogrel  75 mg Oral Q breakfast  . doxercalciferol  4 mcg Intravenous Q M,W,F-HD  . feeding supplement (NEPRO CARB STEADY)  237 mL Oral BID BM  . insulin aspart  0-6 Units Subcutaneous TID WC  . metoprolol succinate  12.5 mg Oral Daily  . multivitamin  1 tablet Oral QHS  . pantoprazole  40 mg Oral QAC  breakfast  . sevelamer carbonate  2,400 mg Oral TID WC  . sodium chloride flush  3 mL Intravenous Q12H   Continuous Infusions: . cefTRIAXone (ROCEPHIN)  IV 2 g (10/19/20 1846)     Time spent: 22 minutes with over 50% of the time coordinating the Karen [name]'s care    Harold Hedge, DO [facility]   Call night coverage person covering after 7pm

## 2020-10-21 DIAGNOSIS — J9601 Acute respiratory failure with hypoxia: Secondary | ICD-10-CM | POA: Diagnosis not present

## 2020-10-21 LAB — CBC
HCT: 32.3 % — ABNORMAL LOW (ref 36.0–46.0)
Hemoglobin: 9.8 g/dL — ABNORMAL LOW (ref 12.0–15.0)
MCH: 25.9 pg — ABNORMAL LOW (ref 26.0–34.0)
MCHC: 30.3 g/dL (ref 30.0–36.0)
MCV: 85.2 fL (ref 80.0–100.0)
Platelets: 99 10*3/uL — ABNORMAL LOW (ref 150–400)
RBC: 3.79 MIL/uL — ABNORMAL LOW (ref 3.87–5.11)
RDW: 19.9 % — ABNORMAL HIGH (ref 11.5–15.5)
WBC: 5.6 10*3/uL (ref 4.0–10.5)
nRBC: 0 % (ref 0.0–0.2)

## 2020-10-21 LAB — HEPATITIS B SURFACE ANTIGEN: Hepatitis B Surface Ag: NONREACTIVE

## 2020-10-21 LAB — GLUCOSE, CAPILLARY
Glucose-Capillary: 99 mg/dL (ref 70–99)
Glucose-Capillary: 132 mg/dL — ABNORMAL HIGH (ref 70–99)
Glucose-Capillary: 67 mg/dL — ABNORMAL LOW (ref 70–99)
Glucose-Capillary: 134 mg/dL — ABNORMAL HIGH (ref 70–99)
Glucose-Capillary: 141 mg/dL — ABNORMAL HIGH (ref 70–99)

## 2020-10-21 LAB — RENAL FUNCTION PANEL
Albumin: 3.1 g/dL — ABNORMAL LOW (ref 3.5–5.0)
Anion gap: 13 (ref 5–15)
BUN: 38 mg/dL — ABNORMAL HIGH (ref 6–20)
CO2: 24 mmol/L (ref 22–32)
Calcium: 9.2 mg/dL (ref 8.9–10.3)
Chloride: 98 mmol/L (ref 98–111)
Creatinine, Ser: 8.48 mg/dL — ABNORMAL HIGH (ref 0.44–1.00)
GFR, Estimated: 6 mL/min — ABNORMAL LOW (ref >60)
Glucose, Bld: 137 mg/dL — ABNORMAL HIGH (ref 70–99)
Phosphorus: 4.8 mg/dL — ABNORMAL HIGH (ref 2.5–4.6)
Potassium: 4.1 mmol/L (ref 3.5–5.1)
Sodium: 135 mmol/L (ref 135–145)

## 2020-10-21 MED ORDER — HEPARIN SODIUM (PORCINE) 1000 UNIT/ML IJ SOLN
INTRAMUSCULAR | Status: AC
  Administered 2020-10-21: 3000 [IU] via INTRAVENOUS_CENTRAL
  Filled 2020-10-21: qty 3

## 2020-10-21 MED ORDER — POLYETHYLENE GLYCOL 3350 17 G PO PACK
17.0000 g | PACK | Freq: Every day | ORAL | Status: DC | PRN
  Administered 2020-10-21 – 2020-10-22 (×2): 17 g via ORAL
  Filled 2020-10-21 (×3): qty 1

## 2020-10-21 MED ORDER — PENTAFLUOROPROP-TETRAFLUOROETH EX AERO
1.0000 | INHALATION_SPRAY | CUTANEOUS | Status: DC | PRN
Start: 2020-10-21 — End: 2020-10-21

## 2020-10-21 MED ORDER — LIDOCAINE HCL (PF) 1 % IJ SOLN
5.0000 mL | INTRAMUSCULAR | Status: DC | PRN
Start: 2020-10-21 — End: 2020-10-21

## 2020-10-21 MED ORDER — ALTEPLASE 2 MG IJ SOLR: 2.0000 mg | Freq: Once | INTRAMUSCULAR | Status: DC | PRN

## 2020-10-21 MED ORDER — HEPARIN SODIUM (PORCINE) 1000 UNIT/ML DIALYSIS: 1000.0000 [IU] | INTRAMUSCULAR | Status: DC | PRN

## 2020-10-21 MED ORDER — DOXERCALCIFEROL 4 MCG/2ML IV SOLN
INTRAVENOUS | Status: AC
  Administered 2020-10-21: 4 ug via INTRAVENOUS
  Filled 2020-10-21: qty 2

## 2020-10-21 MED ORDER — HEPARIN SODIUM (PORCINE) 1000 UNIT/ML DIALYSIS: 3000.0000 [IU] | INTRAMUSCULAR | Status: DC | PRN

## 2020-10-21 NOTE — Progress Notes (Signed)
PROGRESS NOTE    Karen [name]    Code Status: Full Code  XAJ:[id number] DOB: 03/02/1976 DOA: 10/17/2020 LOS: 4 days  PCP: Patient, No Pcp Per CC:  Chief Complaint  Patient presents with  . Diarrhea       Hospital Summary   This is a 45 year old female with past medical history of ESRD on HD (MWF), hypertension, CVA with residual dysarthria, cardiomyopathy, type 2 diabetes with peripheral neuropathy, s/p bilateral BKA who presented to the ED on 1/27 with 2 weeks of nonbloody diarrhea with associated midline abdominal pain also noted that she had a cough during this period of time without any significant fevers.  Reported she feels like food goes down the wrong way when eating.  Acute abdominal series was significant for vascular congestion with mild interstitial edema and normal gas pattern.  C. difficile and GI panel were negative.  CT abdomen pelvis with contrast significant for right posterior lung base pneumonia and interstitial edema in the lungs.  She was started on Rocephin and azithromycin for aspiration pneumonia.  Nephrology was consulted for ESRD    A & P   Principal Problem:   Acute respiratory failure with hypoxia ([place]) Active Problems:   PAD (peripheral artery disease) (H[facility])   S/P BKA (below knee amputation) unilateral ([facility])   Anemia of chronic disease   Diarrhea   Type 2 diabetes mellitus with hyperglycemia ([place])   Hypertensive urgency   Aspiration pneumonia ([place])   1. Acute hypoxic respiratory failure likely secondary to volume overload and aspiration pneumonia, resolved  2. Aspiration pneumonia, resolved a. SLP eval consistent with aspiration on assessment and barium swallow b. Completed 4 days antibiotics.  No leukocytosis, fever or shortness of breath.  Can hold off on further antibiotics  3. Volume overload in the setting of ESRD on HD (MWF), stable a. Missed HD on 10/16/2020, possibly the inciting event? b. Has a pattern of missed and truncated  treatments c. On calcitriol and Sensipar d. Elevated BNP unreliable and ESRD e. HD this a.m., UF as tolerated tomorrow  4. Hypertension a. Continue amlodipine and metoprolol  5. Anemia a. Given Aranesp per nephro with HD 1/29  6. Type 2 diabetes, controlled a. Continue sliding scale  7. Nonbloody diarrhea of unknown etiology, resolved a. Possibly from viral syndrome?  8. PAD a. On statin, Plavix, aspirin  9. Acute on chronic thrombocytopenia, improving   DVT prophylaxis: Place and maintain sequential compression device Start: 10/17/20 1731   Family Communication: Patient updated at bedside today  Disposition Plan: Currently medically stable for discharge.  Awaiting SNF availability Status is: Inpatient  Remains inpatient appropriate because:IV treatments appropriate due to intensity of illness or inability to take PO and Inpatient level of care appropriate due to severity of illness   Dispo: The patient is from: Home              Anticipated d/c is to: SNF              Anticipated d/c date is: 1 day              Patient currently is medically stable to d/c.   Difficult to place patient No           Pressure injury documentation    None  Consultants  Nephrology   Procedures  HD   Antibiotics   Anti-infectives (From admission, onward)   Start     Dose/Rate Route Frequency Ordered Stop   10/19/20 1700  azithromycin (ZITHROMAX) tablet 500 mg  Status:  Discontinued        500 mg Oral Daily 10/19/20 1417 10/21/20 1155   10/17/20 1700  cefTRIAXone (ROCEPHIN) 2 g in sodium chloride 0.9 % 100 mL IVPB  Status:  Discontinued        2 g 200 mL/hr over 30 Minutes Intravenous Every 24 hours 10/17/20 1656 10/21/20 1155   10/17/20 1700  azithromycin (ZITHROMAX) 500 mg in sodium chloride 0.9 % 250 mL IVPB  Status:  Discontinued        500 mg 250 mL/hr over 60 Minutes Intravenous Every 24 hours 10/17/20 1656 10/19/20 1417   10/17/20 1630  ceFEPIme (MAXIPIME) 1 g in  sodium chloride 0.9 % 100 mL IVPB  Status:  Discontinued        1 g 200 mL/hr over 30 Minutes Intravenous Every 24 hours 10/17/20 1600 10/17/20 1629   10/17/20 1630  azithromycin (ZITHROMAX) 500 mg in sodium chloride 0.9 % 250 mL IVPB  Status:  Discontinued        500 mg 250 mL/hr over 60 Minutes Intravenous Every 24 hours 10/17/20 1600 10/17/20 1629        Subjective   Denies any shortness of breath, chest pain or any other complaints.  Feels back to normal is wondering when she can be discharged to the rehab facility.  Otherwise no overnight events or other issues  Objective   Vitals:   10/21/20 0956 10/21/20 1026 10/21/20 1035 10/21/20 1125  BP: (!) 166/80 (!) 172/87 (!) 175/86 (!) 158/122  Pulse:    91  Resp:   16 16  Temp:   98.1 F (36.7 C) 98 F (36.7 C)  TempSrc:   Oral Oral  SpO2:   98% 100%  Weight:   94.8 kg     Intake/Output Summary (Last 24 hours) at 10/21/2020 1458 Last data filed at 10/21/2020 1035 Gross per 24 hour  Intake 3 ml  Output 1319 ml  Net -1316 ml   Filed Weights   10/19/20 0923 10/21/20 0800 10/21/20 1035  Weight: 95.2 kg 96 kg 94.8 kg    Examination:  Physical Exam Vitals and nursing note reviewed.  Constitutional:      Appearance: Normal appearance.  HENT:     Head: Normocephalic and atraumatic.  Eyes:     Conjunctiva/sclera: Conjunctivae normal.  Cardiovascular:     Rate and Rhythm: Normal rate and regular rhythm.  Pulmonary:     Effort: Pulmonary effort is normal.     Breath sounds: Normal breath sounds.  Abdominal:     General: Abdomen is flat.     Palpations: Abdomen is soft.  Musculoskeletal:     Comments: S/p bilateral lower extremity BKA  Skin:    Coloration: Skin is not jaundiced or pale.  Neurological:     Mental Status: She is alert. Mental status is at baseline.  Psychiatric:        Mood and Affect: Mood normal.        Behavior: Behavior normal.     Data Reviewed: I have personally reviewed following labs  and imaging studies  CBC: Recent Labs  Lab 10/17/20 0653 10/18/20 0540 10/19/20 1015 10/20/20 1038 10/21/20 0818  WBC 4.8 5.0 4.6 4.7 5.6  NEUTROABS  --   --   --  2.9  --   HGB 10.6* 10.1* 9.7* 10.3* 9.8*  HCT 35.8* 34.2* 32.3* 35.4* 32.3*  MCV 87.1 86.6 85.0 90.5 85.2  PLT 89* 94* 85*  85* 99*   Basic Metabolic Panel: Recent Labs  Lab 10/18/20 0540 10/19/20 1015 10/20/20 1038 10/20/20 1720 10/21/20 0818  NA 138 137 136 135 135  K 3.9 3.4* 4.3 5.4* 4.1  CL 97* 97* 100 100 98  CO2 27 24 21* 20* 24  GLUCOSE 102* 106* 162* 111* 137*  BUN 30* 26* 28* 31* 38*  CREATININE 10.19* 8.76* 7.49* 7.81* 8.48*  CALCIUM 9.2 8.7* 8.6* 8.8* 9.2  PHOS 6.5*  --   --  4.7* 4.8*   GFR: CrCl cannot be calculated (Unknown ideal weight.). Liver Function Tests: Recent Labs  Lab 10/17/20 0653 10/18/20 0540 10/20/20 1720 10/21/20 0818  AST 14*  --   --   --   ALT 9  --   --   --   ALKPHOS 85  --   --   --   BILITOT 0.8  --   --   --   PROT 7.3  --   --   --   ALBUMIN 3.5 3.3* 3.2* 3.1*   Recent Labs  Lab 10/17/20 0653  LIPASE 16   No results for input(s): AMMONIA in the last 168 hours. Coagulation Profile: No results for input(s): INR, PROTIME in the last 168 hours. Cardiac Enzymes: No results for input(s): CKTOTAL, CKMB, CKMBINDEX, TROPONINI in the last 168 hours. BNP (last 3 results) No results for input(s): PROBNP in the last 8760 hours. HbA1C: No results for input(s): HGBA1C in the last 72 hours. CBG: Recent Labs  Lab 10/20/20 1259 10/20/20 1732 10/21/20 0535 10/21/20 0735 10/21/20 1141  GLUCAP 138* 105* 99 132* 67*   Lipid Profile: No results for input(s): CHOL, HDL, LDLCALC, TRIG, CHOLHDL, LDLDIRECT in the last 72 hours. Thyroid Function Tests: No results for input(s): TSH, T4TOTAL, FREET4, T3FREE, THYROIDAB in the last 72 hours. Anemia Panel: No results for input(s): VITAMINB12, FOLATE, FERRITIN, TIBC, IRON, RETICCTPCT in the last 72 hours. Sepsis  Labs: Recent Labs  Lab 10/18/20 0540  PROCALCITON 0.24    Recent Results (from the past 240 hour(s))  Gastrointestinal Panel by PCR , Stool     Status: None   Collection Time: 10/17/20  8:53 AM   Specimen: Stool  Result Value Ref Range Status   Campylobacter species NOT DETECTED NOT DETECTED Final   Plesimonas shigelloides NOT DETECTED NOT DETECTED Final   Salmonella species NOT DETECTED NOT DETECTED Final   Yersinia enterocolitica NOT DETECTED NOT DETECTED Final   Vibrio species NOT DETECTED NOT DETECTED Final   Vibrio cholerae NOT DETECTED NOT DETECTED Final   Enteroaggregative E coli (EAEC) NOT DETECTED NOT DETECTED Final   Enteropathogenic E coli (EPEC) NOT DETECTED NOT DETECTED Final   Enterotoxigenic E coli (ETEC) NOT DETECTED NOT DETECTED Final   Shiga like toxin producing E coli (STEC) NOT DETECTED NOT DETECTED Final   Shigella/Enteroinvasive E coli (EIEC) NOT DETECTED NOT DETECTED Final   Cryptosporidium NOT DETECTED NOT DETECTED Final   Cyclospora cayetanensis NOT DETECTED NOT DETECTED Final   Entamoeba histolytica NOT DETECTED NOT DETECTED Final   Giardia lamblia NOT DETECTED NOT DETECTED Final   Adenovirus F40/41 NOT DETECTED NOT DETECTED Final   Astrovirus NOT DETECTED NOT DETECTED Final   Norovirus GI/GII NOT DETECTED NOT DETECTED Final   Rotavirus A NOT DETECTED NOT DETECTED Final   Sapovirus (I, II, IV, and V) NOT DETECTED NOT DETECTED Final    Comment: Performed at [facility], [address], [place], [place] [address]  C Difficile Quick Screen w  PCR reflex     Status: None   Collection Time: 10/17/20  8:54 AM   Specimen: Stool  Result Value Ref Range Status   C Diff antigen NEGATIVE NEGATIVE Final   C Diff toxin NEGATIVE NEGATIVE Final   C Diff interpretation No C. difficile detected.  Final    Comment: Performed at [facility], [place] [address] [place], [place] [address]  SARS Coronavirus 2 by RT PCR (hospital order, performed in [facility] hospital lab) Nasopharyngeal Nasopharyngeal Swab     Status: None   Collection Time: 10/17/20  4:16 PM   Specimen: Nasopharyngeal Swab  Result Value Ref Range Status   SARS Coronavirus 2 NEGATIVE NEGATIVE Final    Comment: (NOTE) SARS-CoV-2 target nucleic acids are NOT DETECTED.  The SARS-CoV-2 RNA is generally detectable in upper and lower respiratory specimens during the acute phase of infection. The lowest concentration of SARS-CoV-2 viral copies this assay can detect is 250 copies / mL. A negative result does not preclude SARS-CoV-2 infection and should not be used as the sole basis for treatment or other patient management decisions.  A negative result may occur with improper specimen collection / handling, submission of specimen other than nasopharyngeal swab, presence of viral mutation(s) within the areas targeted by this assay, and inadequate number of viral copies (<250 copies / mL). A negative result must be combined with clinical observations, patient history, and epidemiological information.  Fact Sheet for Patients:   StrictlyIdeas.no  Fact Sheet for Healthcare Providers: BankingDealers.co.za  This test is not yet approved or  cleared by the [place] FDA and has been authorized for detection and/or diagnosis of SARS-CoV-2 by FDA under an Emergency Use Authorization (EUA).  This EUA will remain in effect (meaning this test can be used) for the duration of the COVID-19 declaration under Section 564(b)(1) of the Act, 21 U.S.C. section 360bbb-3(b)(1), unless the authorization is terminated or revoked sooner.  Performed at [facility], [place] [address], [place], [place] [address]          Radiology Studies: No results found.      Scheduled Meds: . amLODipine  10 mg Oral Daily  . atorvastatin  40 mg Oral Daily  . Chlorhexidine Gluconate Cloth  6 each Topical Q0600  . clopidogrel  75 mg Oral Q  breakfast  . doxercalciferol  4 mcg Intravenous Q M,W,F-HD  . feeding supplement (NEPRO CARB STEADY)  237 mL Oral BID BM  . insulin aspart  0-6 Units Subcutaneous TID WC  . metoprolol succinate  12.5 mg Oral Daily  . multivitamin  1 tablet Oral QHS  . pantoprazole  40 mg Oral QAC breakfast  . sevelamer carbonate  2,400 mg Oral TID WC  . sodium chloride flush  3 mL Intravenous Q12H   Continuous Infusions:    Time spent: 15 minutes with over 50% of the time coordinating the patient's care    Harold Hedge, DO [facility]   Call night coverage person covering after 7pm

## 2020-10-21 NOTE — Progress Notes (Signed)
12 Notified Dr Cyd Silence that pt is requesting Miralax prn for constipation, pt says she had a BM earlier but it was hard. B/P was 195/89, Hydralazine 10 mg IVP was given and was reported to physician. Will continue to monitor.

## 2020-10-21 NOTE — Procedures (Signed)
I was present at this dialysis session. I have reviewed the session itself and made appropriate changes.   Vital signs in last 24 hours:  Temp:  [98.1 F (36.7 C)-98.6 F (37 C)] 98.1 F (36.7 C) (01/31 0800) Pulse Rate:  [83-92] 83 (01/31 0800) Resp:  [13-16] 16 (01/31 0800) BP: (145-195)/(77-95) 165/88 (01/31 0826) SpO2:  [95 %-100 %] 98 % (01/31 0800) Weight:  [96 kg] 96 kg (01/31 0800) Weight change:  Filed Weights   10/19/20 0923 10/21/20 0800  Weight: 95.2 kg 96 kg    Recent Labs  Lab 10/20/20 1720  NA 135  K 5.4*  CL 100  CO2 20*  GLUCOSE 111*  BUN 31*  CREATININE 7.81*  CALCIUM 8.8*  PHOS 4.7*    Recent Labs  Lab 10/19/20 1015 10/20/20 1038 10/21/20 0818  WBC 4.6 4.7 5.6  NEUTROABS  --  2.9  --   HGB 9.7* 10.3* 9.8*  HCT 32.3* 35.4* 32.3*  MCV 85.0 90.5 85.2  PLT 85* 85* 99*    Scheduled Meds: . amLODipine  10 mg Oral Daily  . atorvastatin  40 mg Oral Daily  . azithromycin  500 mg Oral Daily  . Chlorhexidine Gluconate Cloth  6 each Topical Q0600  . clopidogrel  75 mg Oral Q breakfast  . doxercalciferol  4 mcg Intravenous Q M,W,F-HD  . feeding supplement (NEPRO CARB STEADY)  237 mL Oral BID BM  . insulin aspart  0-6 Units Subcutaneous TID WC  . metoprolol succinate  12.5 mg Oral Daily  . multivitamin  1 tablet Oral QHS  . pantoprazole  40 mg Oral QAC breakfast  . sevelamer carbonate  2,400 mg Oral TID WC  . sodium chloride flush  3 mL Intravenous Q12H   Continuous Infusions: . cefTRIAXone (ROCEPHIN)  IV 2 g (10/20/20 1740)   PRN Meds:.acetaminophen **OR** acetaminophen, albuterol, alteplase, heparin, heparin, hydrALAZINE, lidocaine (PF), lidocaine-prilocaine, loperamide, ondansetron **OR** ondansetron (ZOFRAN) IV, pentafluoroprop-tetrafluoroeth, polyethylene glycol     Dialysis Orders:Dialysis Orders:[place] MWF 4 hrs 180NRe 400/80094kg 2.0 K/ 2.0Ca UFP 2 L AVG -Heparin3000 units IV TIW -Hectorol56mcg IV TIW -Mircera168mcg IV q 2  weeks (Last dose01/14/2022) -Venofer 100 mg IV X 5 doses 1/5 doses given  Assessment/Plan: 1. Volume overload: She appears to have lost body weight, missed and truncated treatments. Lower volume as tolerated. BNP not a reliable marker in ESRD. 3 liters removed in HD 10/19/20. Continue lowering volume as tolerated.  2. Acute hypoxic respiratory failure: Believe primary issue is volume overload D/T truncated and missed HD. Also being treated for PNA per primary.  3. Aspiration PNA. WBC 5.0, afebrile. ABX per primary. COVID negative. 4. ESRD - MWF. Continues pattern of missed and truncated treatments.  Back on outpatient schedule. 5. Hypertension/volume -Volume as noted above. Hypertensive on admission. Resumed amlodipine and metoprolol. BP more stable. Net UF 3 liters with HD 01/29. No post wt. Order daily wts. UF as tolerated tomorrow.  6. Anemia - HGB 10.1 Next dose due01/28/2022. Rec'd Aranesp 40 mcg IV with HD 10/19/20 7. Metabolic bone disease - C Ca 9.76 PO4 elevated. Chronic issue with noncompliance with binders. Continue binders/VDRA.Checking PO4 today.  8. Nutrition - Albumin 3.3. Renal/Carb mod diet with nepro, renal vit.Asking for regular diet which she eats at home. K+ OK. Regular diet. 9. DM-per primary 10. H/O CVA. Speech study today. No treatment recommended. 11. Social issues: Asking for SNF placement.    Karen Potts,  MD 10/21/2020, 8:47 AM

## 2020-10-22 DIAGNOSIS — J9601 Acute respiratory failure with hypoxia: Secondary | ICD-10-CM | POA: Diagnosis not present

## 2020-10-22 LAB — GLUCOSE, CAPILLARY
Glucose-Capillary: 72 mg/dL (ref 70–99)
Glucose-Capillary: 154 mg/dL — ABNORMAL HIGH (ref 70–99)
Glucose-Capillary: 119 mg/dL — ABNORMAL HIGH (ref 70–99)
Glucose-Capillary: 152 mg/dL — ABNORMAL HIGH (ref 70–99)

## 2020-10-22 NOTE — Progress Notes (Signed)
Patient ID: Karen [name], female   DOB: 04-28-1976, 45 y.o.   MRN: [id number] S: states that "dialysis made me sick yesterday" but no issues documented  O:BP (!) 151/79   Pulse 89   Temp 98 F (36.7 C)   Resp 16   Wt 95.9 kg   LMP  (LMP Unknown)   SpO2 97%   BMI 34.14 kg/m   Intake/Output Summary (Last 24 hours) at 10/22/2020 0947 Last data filed at 10/21/2020 1035 Gross per 24 hour  Intake -  Output 1319 ml  Net -1319 ml   Intake/Output: I/O last 3 completed shifts: In: 3 [I.V.:3] Out: 1319 [Other:1319]  Intake/Output this shift:  No intake/output data recorded. Weight change:  Gen: NAD CVS: RRR Resp: cta Abd: +BS, soft, NT/ND Ext: no edema, LAVG +T/B  Recent Labs  Lab 10/17/20 0653 10/18/20 0540 10/19/20 1015 10/20/20 1038 10/20/20 1720 10/21/20 0818  NA 141 138 137 136 135 135  K 3.5 3.9 3.4* 4.3 5.4* 4.1  CL 98 97* 97* 100 100 98  CO2 27 27 24  21* 20* 24  GLUCOSE 110* 102* 106* 162* 111* 137*  BUN 23* 30* 26* 28* 31* 38*  CREATININE 9.52* 10.19* 8.76* 7.49* 7.81* 8.48*  ALBUMIN 3.5 3.3*  --   --  3.2* 3.1*  CALCIUM 9.6 9.2 8.7* 8.6* 8.8* 9.2  PHOS  --  6.5*  --   --  4.7* 4.8*  AST 14*  --   --   --   --   --   ALT 9  --   --   --   --   --    Liver Function Tests: Recent Labs  Lab 10/17/20 0653 10/18/20 0540 10/20/20 1720 10/21/20 0818  AST 14*  --   --   --   ALT 9  --   --   --   ALKPHOS 85  --   --   --   BILITOT 0.8  --   --   --   PROT 7.3  --   --   --   ALBUMIN 3.5 3.3* 3.2* 3.1*   Recent Labs  Lab 10/17/20 0653  LIPASE 16   No results for input(s): AMMONIA in the last 168 hours. CBC: Recent Labs  Lab 10/17/20 0653 10/18/20 0540 10/19/20 1015 10/20/20 1038 10/21/20 0818  WBC 4.8 5.0 4.6 4.7 5.6  NEUTROABS  --   --   --  2.9  --   HGB 10.6* 10.1* 9.7* 10.3* 9.8*  HCT 35.8* 34.2* 32.3* 35.4* 32.3*  MCV 87.1 86.6 85.0 90.5 85.2  PLT 89* 94* 85* 85* 99*   Cardiac Enzymes: No results for input(s): CKTOTAL, CKMB, CKMBINDEX,  TROPONINI in the last 168 hours. CBG: Recent Labs  Lab 10/21/20 0735 10/21/20 1141 10/21/20 1529 10/21/20 2002 10/22/20 0806  GLUCAP 132* 67* 134* 141* 72    Iron Studies: No results for input(s): IRON, TIBC, TRANSFERRIN, FERRITIN in the last 72 hours. Studies/Results: No results found. [place] Kitchen amLODipine  10 mg Oral Daily  . atorvastatin  40 mg Oral Daily  . Chlorhexidine Gluconate Cloth  6 each Topical Q0600  . clopidogrel  75 mg Oral Q breakfast  . doxercalciferol  4 mcg Intravenous Q M,W,F-HD  . feeding supplement (NEPRO CARB STEADY)  237 mL Oral BID BM  . insulin aspart  0-6 Units Subcutaneous TID WC  . metoprolol succinate  12.5 mg Oral Daily  . multivitamin  1 tablet Oral QHS  .  pantoprazole  40 mg Oral QAC breakfast  . sevelamer carbonate  2,400 mg Oral TID WC  . sodium chloride flush  3 mL Intravenous Q12H    BMET    Component Value Date/Time   NA 135 10/21/2020 0818   K 4.1 10/21/2020 0818   CL 98 10/21/2020 0818   CO2 24 10/21/2020 0818   GLUCOSE 137 (H) 10/21/2020 0818   BUN 38 (H) 10/21/2020 0818   CREATININE 8.48 (H) 10/21/2020 0818   CALCIUM 9.2 10/21/2020 0818   GFRNONAA 6 (L) 10/21/2020 0818   GFRAA 6 (L) 04/15/2020 1225   CBC    Component Value Date/Time   WBC 5.6 10/21/2020 0818   RBC 3.79 (L) 10/21/2020 0818   HGB 9.8 (L) 10/21/2020 0818   HCT 32.3 (L) 10/21/2020 0818   PLT 99 (L) 10/21/2020 0818   MCV 85.2 10/21/2020 0818   MCH 25.9 (L) 10/21/2020 0818   MCHC 30.3 10/21/2020 0818   RDW 19.9 (H) 10/21/2020 0818   LYMPHSABS 1.0 10/20/2020 1038   MONOABS 0.6 10/20/2020 1038   EOSABS 0.1 10/20/2020 1038   BASOSABS 0.0 10/20/2020 1038      Dialysis Orders:Dialysis Orders:[name] MWF 4 hrs 180NRe 400/80094kg 2.0 K/ 2.0Ca UFP 2 L AVG -Heparin3000 units IV TIW -Hectorol23mcg IV TIW -Mircera156mcg IV q 2 weeks (Last dose01/14/2022) -Venofer 100 mg IV X 5 doses 1/5 doses given  Assessment/Plan: 1. Volume overload: She appears to  have lost body weight, missed and truncated treatments. Lower volume as tolerated. BNP not a reliable marker in ESRD.3 liters removed in HD 10/19/20. Continue lowering volume as tolerated. 2. Acute hypoxic respiratory failure: Believe primary issue is volume overload D/T truncated and missed HD. Also being treated for PNA per primary.  3. Aspiration PNA. WBC 5.0, afebrile. ABX per primary. COVID negative. 4. ESRD - MWF. Continues pattern of missed and truncated treatments.  Back on outpatient schedule. 5. Hypertension/volume -Volume as noted above. Hypertensive on admission. Resumed amlodipine and metoprolol. BP more stable.Net UF 3 liters with HD 01/29. No post wt. Order daily wts. UF as tolerated tomorrow. 6. Anemia - HGB 10.1 Next dose due01/28/2022.Rec'dAranesp 40 mcg IV with HD 10/19/20 7. Metabolic bone disease - C Ca 9.76 PO4 elevated. Chronic issue with noncompliance with binders. Continue binders/VDRA.Checking PO4 today. 8. Nutrition - Albumin 3.3. Renal/Carb mod diet with nepro, renal vit.Asking for regular diet which she eats at home. K+ OK. Regular diet. 9. DM-per primary 10. Thrombocytopenia- follow platelets.   47. H/O CVA. Speech study today. No treatment recommended. 12. Social issues: Asking for SNF placement.   Donetta Potts, MD [employer] [phone]

## 2020-10-22 NOTE — Progress Notes (Signed)
Pt. Refused all morning meds. Pt. Was educated what medications were for. Despite pt. Still refused. Pt. Also refused cardiac monitoring. MD made aware.

## 2020-10-22 NOTE — TOC Initial Note (Signed)
Transition of Care [facility]) - Initial/Assessment Note    Patient Details  Name: Karen [name] MRN: [id number] Date of Birth: 11-04-1975  Transition of Care [employer]) CM/SW Contact:    Joanne Chars, LCSW Phone Number: 10/22/2020, 4:32 PM  Clinical Narrative:  CSW met with pt to discuss discharge plan.  Pt would like to go to SNF rehab, permission given to send out referral.  Pt denies that she has any family support, states that her church is her support, permission given to speak with her pastor, Helyn App.  Pt also identifies Edgardo Roys as her CAP worker.  Pt reports she does have PCP: Kathrynn Ducking at [facility] office in [place].  Current equipment in home: bedside commode, hospital bed, reacher.    No notes regarding legal guardian found in chart.  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                   Expected Discharge Plan: Skilled Nursing Facility Barriers to Discharge: SNF Pending bed offer   Patient Goals and CMS Choice Patient states their goals for this hospitalization and ongoing recovery are:: "get back to walking" CMS Medicare.gov Compare Post Acute Care list provided to:: Patient Choice offered to / list presented to : Patient  Expected Discharge Plan and Services Expected Discharge Plan: [place] Choice: [place] Living arrangements for the past 2 months: Apartment                                      Prior Living Arrangements/Services Living arrangements for the past 2 months: Apartment Lives with:: Self Patient language and need for interpreter reviewed:: Yes Do you feel safe going back to the place where you live?: Yes      Need for Family Participation in Patient Care: No (Comment) Care giver support system in place?: No (comment) Current home services: Homehealth aide Criminal Activity/Legal Involvement Pertinent to Current Situation/Hospitalization: No - Comment as needed  Activities of Daily  Living      Permission Sought/Granted Permission sought to share information with : Family Supports Permission granted to share information with : Yes, Verbal Permission Granted  Share Information with NAME: Lorna Few, also gave permission for other church members  Permission granted to share info w AGENCY: SNF        Emotional Assessment Appearance:: Appears stated age Attitude/Demeanor/Rapport: Engaged Affect (typically observed): Appropriate,Pleasant Orientation: : Oriented to Self,Oriented to Place,Oriented to  Time,Oriented to Situation Alcohol / Substance Use: Not Applicable Psych Involvement: No (comment)  Admission diagnosis:  Aspiration pneumonia ([place]) [J69.0] End stage renal disease on dialysis ([place]) [N18.6, Z99.2] Acute respiratory failure with hypoxia ([place]) [J96.01] Nausea vomiting and diarrhea [R11.2, R19.7] Community acquired pneumonia of right lung, unspecified part of lung [J18.9] Patient Active Problem List   Diagnosis Date Noted  . Acute respiratory failure with hypoxia ([place]) 10/17/2020  . Hypertensive urgency 10/17/2020  . Aspiration pneumonia ([place]) 10/17/2020  . Right leg pain   . Palliative care by specialist   . Nausea and vomiting 02/28/2020  . Abrasion 02/28/2020  . Avulsion fracture of ankle 02/28/2020  . Drug or medicinal substance causing adverse effect in therapeutic use 02/28/2020  . Elevated brain natriuretic peptide (BNP) level 02/28/2020  . HLD (hyperlipidemia) 02/28/2020  . Hypertension, poor control 02/28/2020  . Hypertensive emergency 02/28/2020  . Hypoxia  02/28/2020  . Mild dehydration 02/28/2020  . Sedimentation rate elevation 02/28/2020  . Strange and inexplicable behavior 30/86/5784  . COVID-19 01/19/2020  . Dysphasia status post cerebrovascular accident 11/06/2019  . Lumbar spondylosis 11/06/2019  . Anxiety and depression 06/04/2019  . Cerebral infarction due to thrombosis of unspecified precerebral artery ([place])  06/04/2019  . Chronic pain syndrome 06/04/2019  . Neck pain on right side 06/04/2019  . Stroke due to embolism of right middle cerebral artery ([place]) 06/02/2019  . Pulmonary edema 02/20/2019  . Excoriation (skin-picking) disorder 11/22/2018  . Type 2 diabetes mellitus with hyperglycemia ([place]) 11/14/2018  . SBO (small bowel obstruction) ([place]) 07/08/2018  . Small bowel obstruction ([place]) 07/08/2018  . Hypercalcemia 02/17/2018  . Weakness generalized   . Acute on chronic systolic CHF (congestive heart failure) ([place])   . ESRD (end stage renal disease) on dialysis ([place])   . Homelessness   . Late effect of cerebrovascular accident (CVA)   . Dyspnea 01/27/2018  . Hypertension, uncontrolled 11/11/2017  . Fluid overload 11/11/2017  . Abdominal pain   . Sepsis due to methicillin resistant Staphylococcus aureus ([place]) 11/01/2017  . Penicillin allergy 10/15/2017  . Thrombocytopenia ([place]) 10/15/2017  . Personal history of transient ischemic attack (TIA), and cerebral infarction without residual deficits 09/15/2017  . Dysarthria 09/14/2017  . Lacunar infarct, acute ([place]) 09/14/2017  . Obesity 09/09/2017  . Chest pain 09/07/2017  . Other staphylococcus as the cause of diseases classified elsewhere 08/23/2017  . Diarrhea 12/29/2016  . Fluid overload, unspecified 10/03/2016  . Hyperkalemia 10/03/2016  . Hypertension with fluid overload 10/03/2016  . Anemia of chronic disease 09/08/2016  . High anion gap metabolic acidosis 69/62/9528  . Right lower lobe pneumonia 09/08/2016  . Iron deficiency anemia, unspecified 02/19/2016  . Complication of vascular dialysis catheter 02/18/2016  . Encounter for removal of sutures 02/11/2016  . Uncontrolled type 2 diabetes mellitus with chronic kidney disease on chronic dialysis, with long-term current use of insulin ([place]) 01/31/2016  . Drug noncompliance   . End-stage renal disease on hemodialysis ([place])   . Unspecified protein-calorie malnutrition ([place])  01/24/2016  . Secondary hyperparathyroidism of renal origin ([place]) 01/22/2016  . Acquired absence of left leg below knee ([place]) 01/16/2016  . CHF (congestive heart failure) ([place]) 01/16/2016  . Coagulation defect, unspecified ([place]) 01/16/2016  . DNR (do not resuscitate) discussion 01/16/2016  . Family history of diabetes mellitus 01/16/2016  . Hypertensive chronic kidney disease with stage 1 through stage 4 chronic kidney disease, or unspecified chronic kidney disease 01/16/2016  . Nicotine dependence, other tobacco product, in remission 01/16/2016  . Pain, unspecified 01/16/2016  . Pruritus, unspecified 01/16/2016  . Red blood cell antibody positive 01/10/2016  . Hx of amputation below knee, right ([place]) 07/03/2013  . S/P BKA (below knee amputation) unilateral ([place]) 06/30/2013  . Physical deconditioning 06/30/2013  . Cardiomyopathy, ischemic - EF 45-50% with inf WMA by 2D 02/05/13 02/06/2013  . Essential hypertension 02/05/2013  . PAD (peripheral artery disease) ([place]) 02/05/2013  . Diabetic neuropathy ([place])   . Osteomyelitis of left great toe - S/P amputation 02/06/13 04/21/2011   PCP:  Patient, No Pcp Per Pharmacy:   [facility] [place], [place] AT [address] [place] [address] Phone: [phone] Fax: [phone]     Social Determinants of Health (SDOH) Interventions    Readmission Risk Interventions Readmission Risk Prevention Plan 04/04/2020  Social Work Consult for  Recovery Care Planning/Counseling Complete  Medication Review (RN Care Manager) Complete  Some recent data might be hidden

## 2020-10-22 NOTE — NC FL2 (Signed)
[place] LEVEL OF CARE SCREENING TOOL     IDENTIFICATION  Patient Name: Karen [name] Birthdate: 13-Dec-1975 Sex: female Admission Date (Current Location): 10/17/2020  [place] and [place] Number:  Karen [name] [id number] [place] and Address:  [facility] [place], [place] [address] [place], [place] [address]      Provider Number: [id number]  Attending Physician Name and Address:  Harold Hedge, MD  Relative Name and Phone Number:  Karen [name] [phone]    Current Level of Care: Hospital Recommended Level of Care: [place] Prior Approval Number:    Date Approved/Denied:   PASRR Number: [id number]  Discharge Plan: SNF    Current Diagnoses: Patient Active Problem List   Diagnosis Date Noted  . Acute respiratory failure with hypoxia ([place]) 10/17/2020  . Hypertensive urgency 10/17/2020  . Aspiration pneumonia ([place]) 10/17/2020  . Right leg pain   . Palliative care by specialist   . Nausea and vomiting 02/28/2020  . Abrasion 02/28/2020  . Avulsion fracture of ankle 02/28/2020  . Drug or medicinal substance causing adverse effect in therapeutic use 02/28/2020  . Elevated brain natriuretic peptide (BNP) level 02/28/2020  . HLD (hyperlipidemia) 02/28/2020  . Hypertension, poor control 02/28/2020  . Hypertensive emergency 02/28/2020  . Hypoxia 02/28/2020  . Mild dehydration 02/28/2020  . Sedimentation rate elevation 02/28/2020  . Strange and inexplicable behavior 76/19/5093  . COVID-19 01/19/2020  . Dysphasia status post cerebrovascular accident 11/06/2019  . Lumbar spondylosis 11/06/2019  . Anxiety and depression 06/04/2019  . Cerebral infarction due to thrombosis of unspecified precerebral artery ([place]) 06/04/2019  . Chronic pain syndrome 06/04/2019  . Neck pain on right side 06/04/2019  . Stroke due to embolism of right middle cerebral artery ([place]) 06/02/2019  . Pulmonary edema 02/20/2019  . Excoriation  (skin-picking) disorder 11/22/2018  . Type 2 diabetes mellitus with hyperglycemia ([place]) 11/14/2018  . SBO (small bowel obstruction) ([place]) 07/08/2018  . Small bowel obstruction ([place]) 07/08/2018  . Hypercalcemia 02/17/2018  . Weakness generalized   . Acute on chronic systolic CHF (congestive heart failure) ([place])   . ESRD (end stage renal disease) on dialysis ([place])   . Homelessness   . Late effect of cerebrovascular accident (CVA)   . Dyspnea 01/27/2018  . Hypertension, uncontrolled 11/11/2017  . Fluid overload 11/11/2017  . Abdominal pain   . Sepsis due to methicillin resistant Staphylococcus aureus ([place]) 11/01/2017  . Penicillin allergy 10/15/2017  . Thrombocytopenia ([place]) 10/15/2017  . Personal history of transient ischemic attack (TIA), and cerebral infarction without residual deficits 09/15/2017  . Dysarthria 09/14/2017  . Lacunar infarct, acute ([place]) 09/14/2017  . Obesity 09/09/2017  . Chest pain 09/07/2017  . Other staphylococcus as the cause of diseases classified elsewhere 08/23/2017  . Diarrhea 12/29/2016  . Fluid overload, unspecified 10/03/2016  . Hyperkalemia 10/03/2016  . Hypertension with fluid overload 10/03/2016  . Anemia of chronic disease 09/08/2016  . High anion gap metabolic acidosis 26/71/2458  . Right lower lobe pneumonia 09/08/2016  . Iron deficiency anemia, unspecified 02/19/2016  . Complication of vascular dialysis catheter 02/18/2016  . Encounter for removal of sutures 02/11/2016  . Uncontrolled type 2 diabetes mellitus with chronic kidney disease on chronic dialysis, with long-term current use of insulin ([place]) 01/31/2016  . Drug noncompliance   . End-stage renal disease on hemodialysis ([place])   . Unspecified protein-calorie malnutrition ([place]) 01/24/2016  . Secondary hyperparathyroidism of renal origin ([place]) 01/22/2016  . Acquired absence of left leg below knee (HCC)  01/16/2016  . CHF (congestive heart failure) ([place]) 01/16/2016  . Coagulation defect,  unspecified ([place]) 01/16/2016  . DNR (do not resuscitate) discussion 01/16/2016  . Family history of diabetes mellitus 01/16/2016  . Hypertensive chronic kidney disease with stage 1 through stage 4 chronic kidney disease, or unspecified chronic kidney disease 01/16/2016  . Nicotine dependence, other tobacco product, in remission 01/16/2016  . Pain, unspecified 01/16/2016  . Pruritus, unspecified 01/16/2016  . Red blood cell antibody positive 01/10/2016  . Hx of amputation below knee, right ([place]) 07/03/2013  . S/P BKA (below knee amputation) unilateral ([place]) 06/30/2013  . Physical deconditioning 06/30/2013  . Cardiomyopathy, ischemic - EF 45-50% with inf WMA by 2D 02/05/13 02/06/2013  . Essential hypertension 02/05/2013  . PAD (peripheral artery disease) ([place]) 02/05/2013  . Diabetic neuropathy ([place])   . Osteomyelitis of left great toe - S/P amputation 02/06/13 04/21/2011    Orientation RESPIRATION BLADDER Height & Weight     Self,Time,Situation,Place  Normal Incontinent Weight: 211 lb 6.7 oz (95.9 kg) Height:     BEHAVIORAL SYMPTOMS/MOOD NEUROLOGICAL BOWEL NUTRITION STATUS      Incontinent Diet (carb modified, see discharge summary)  AMBULATORY STATUS COMMUNICATION OF NEEDS Skin   Limited Assist Verbally Other (Comment) (rash)                       Personal Care Assistance Level of Assistance  Bathing,Feeding,Dressing Bathing Assistance: Limited assistance Feeding assistance: Independent Dressing Assistance: Limited assistance     Functional Limitations Info  Sight,Hearing,Speech Sight Info: Adequate Hearing Info: Adequate Speech Info: Adequate    SPECIAL CARE FACTORS FREQUENCY  PT (By licensed PT)     PT Frequency: 5x week              Contractures Contractures Info: Not present    Additional Factors Info  Code Status,Allergies,Insulin Sliding Scale Code Status Info: full Allergies Info: Baclofen, Penicillins, Morphine And Related, Novolog (Insulin  Aspart)   Insulin Sliding Scale Info: Novolog 0-6 units, 3x day with meals       Current Medications (10/22/2020):  This is the current hospital active medication list Current Facility-Administered Medications  Medication Dose Route Frequency Provider Last Rate Last Admin  . acetaminophen (TYLENOL) tablet 650 mg  650 mg Oral Q6H PRN Fuller Plan A, MD   650 mg at 10/19/20 1151   Or  . acetaminophen (TYLENOL) suppository 650 mg  650 mg Rectal Q6H PRN Fuller Plan A, MD      . albuterol (PROVENTIL) (2.5 MG/3ML) 0.083% nebulizer solution 2.5 mg  2.5 mg Nebulization Q6H PRN Fuller Plan A, MD   2.5 mg at 10/19/20 0337  . amLODipine (NORVASC) tablet 10 mg  10 mg Oral Daily Fuller Plan A, MD   10 mg at 10/21/20 1239  . atorvastatin (LIPITOR) tablet 40 mg  40 mg Oral Daily Tamala Julian, Rondell A, MD   40 mg at 10/21/20 1225  . Chlorhexidine Gluconate Cloth 2 % PADS 6 each  6 each Topical Q0600 Valentina Gu, NP   6 each at 10/22/20 [phone]  . clopidogrel (PLAVIX) tablet 75 mg  75 mg Oral Q breakfast Fuller Plan A, MD   75 mg at 10/21/20 1226  . doxercalciferol (HECTOROL) injection 4 mcg  4 mcg Intravenous Q M,W,F-HD Valentina Gu, NP   4 mcg at 10/21/20 0830  . feeding supplement (NEPRO CARB STEADY) liquid 237 mL  237 mL Oral BID BM Valentina Gu, NP   [phone]  mL at 10/22/20 1416  . hydrALAZINE (APRESOLINE) injection 10 mg  10 mg Intravenous Q4H PRN Fuller Plan A, MD   10 mg at 10/21/20 0004  . insulin aspart (novoLOG) injection 0-6 Units  0-6 Units Subcutaneous TID WC Smith, Rondell A, MD   1 Units at 10/22/20 1415  . lidocaine-prilocaine (EMLA) cream 1 application  1 application Topical Daily PRN Norval Morton, MD   1 application at 16/10/96 0551  . loperamide (IMODIUM) capsule 2 mg  2 mg Oral PRN Fuller Plan A, MD      . metoprolol succinate (TOPROL-XL) 24 hr tablet 12.5 mg  12.5 mg Oral Daily Smith, Rondell A, MD   12.5 mg at 10/21/20 1226  . multivitamin (RENA-VIT)  tablet 1 tablet  1 tablet Oral QHS Fuller Plan A, MD   1 tablet at 10/21/20 2128  . ondansetron (ZOFRAN) tablet 4 mg  4 mg Oral Q6H PRN Fuller Plan A, MD       Or  . ondansetron (ZOFRAN) injection 4 mg  4 mg Intravenous Q6H PRN Fuller Plan A, MD   4 mg at 10/21/20 1236  . pantoprazole (PROTONIX) EC tablet 40 mg  40 mg Oral QAC breakfast Fuller Plan A, MD   40 mg at 10/21/20 1226  . polyethylene glycol (MIRALAX / GLYCOLAX) packet 17 g  17 g Oral Daily PRN Vernelle Emerald, MD   17 g at 10/21/20 1448  . sevelamer carbonate (RENVELA) tablet 2,400 mg  2,400 mg Oral TID WC Valentina Gu, NP   2,400 mg at 10/22/20 1416  . sodium chloride flush (NS) 0.9 % injection 3 mL  3 mL Intravenous Q12H Fuller Plan A, MD   3 mL at 10/21/20 2129     Discharge Medications: Please see discharge summary for a list of discharge medications.  Relevant Imaging Results:  Relevant Lab Results:   Additional Information SS#: [id number]; HD TTS in [place]  Karen [name], Karen Beets, LCSW

## 2020-10-22 NOTE — Progress Notes (Signed)
PROGRESS NOTE    Karen [name]    Code Status: Full Code  NTI:[id number] DOB: April 25, 1976 DOA: 10/17/2020 LOS: 5 days  PCP: Patient, No Pcp Per CC:  Chief Complaint  Patient presents with  . Diarrhea       Hospital Summary   This is a 45 year old female with past medical history of ESRD on HD (MWF), hypertension, CVA with residual dysarthria, cardiomyopathy, type 2 diabetes with peripheral neuropathy, s/p bilateral BKA who presented to the ED on 1/27 with 2 weeks of nonbloody diarrhea with associated midline abdominal pain also noted that she had a cough during this period of time without any significant fevers.  Reported she feels like food goes down the wrong way when eating.  Acute abdominal series was significant for vascular congestion with mild interstitial edema and normal gas pattern.  C. difficile and GI panel were negative.  CT abdomen pelvis with contrast significant for right posterior lung base pneumonia and interstitial edema in the lungs.  She was started on Rocephin and azithromycin for aspiration pneumonia.  Nephrology was consulted for ESRD    A & P   Principal Problem:   Acute respiratory failure with hypoxia ([place]) Active Problems:   PAD (peripheral artery disease) (HCC)   S/P BKA (below knee amputation) unilateral (H[facility])   Anemia of chronic disease   Diarrhea   Type 2 diabetes mellitus with hyperglycemia ([place])   Hypertensive urgency   Aspiration pneumonia ([place])   1. Acute hypoxic respiratory failure likely secondary to volume overload and aspiration pneumonia, resolved  2. Aspiration pneumonia, resolved a. SLP eval consistent with aspiration on assessment and barium swallow b. Completed 4 days of antibiotics without leukocytosis, fever or shortness of breath  3. Volume overload in the setting of ESRD on HD (MWF), stable a. Missed HD on 10/16/2020, possibly the inciting event? b. Has a pattern of missed and truncated treatments c. On calcitriol and  Sensipar d. Elevated BNP unreliable and ESRD e. Back on outpatient HD schedule  4. Hypertension a. Hypertensive today, refused her p.o. meds this a.m. b. Continue amlodipine and metoprolol can encourage compliance c. Hydralazine IV as needed  5. Anemia a. Given Aranesp per nephro with HD 1/29  6. Type 2 diabetes, stable a. Continue sliding scale  7. Nonbloody diarrhea of unknown etiology, resolved a. Possibly from viral syndrome?  8. PAD a. On statin, Plavix, aspirin  9. Acute on chronic thrombocytopenia, improving   DVT prophylaxis: Place and maintain sequential compression device Start: 10/17/20 1731   Family Communication: Patient updated at bedside today  Disposition Plan: Currently medically stable for discharge.  Awaiting SNF availability Status is: Inpatient  Remains inpatient appropriate because:IV treatments appropriate due to intensity of illness or inability to take PO and Inpatient level of care appropriate due to severity of illness   Dispo: The patient is from: Home              Anticipated d/c is to: SNF              Anticipated d/c date is: 1 day              Patient currently is medically stable to d/c.   Difficult to place patient No           Pressure injury documentation    None  Consultants  Nephrology   Procedures  HD   Antibiotics   Anti-infectives (From admission, onward)   Start     Dose/Rate Route  Frequency Ordered Stop   10/19/20 1700  azithromycin (ZITHROMAX) tablet 500 mg  Status:  Discontinued        500 mg Oral Daily 10/19/20 1417 10/21/20 1155   10/17/20 1700  cefTRIAXone (ROCEPHIN) 2 g in sodium chloride 0.9 % 100 mL IVPB  Status:  Discontinued        2 g 200 mL/hr over 30 Minutes Intravenous Every 24 hours 10/17/20 1656 10/21/20 1155   10/17/20 1700  azithromycin (ZITHROMAX) 500 mg in sodium chloride 0.9 % 250 mL IVPB  Status:  Discontinued        500 mg 250 mL/hr over 60 Minutes Intravenous Every 24 hours  10/17/20 1656 10/19/20 1417   10/17/20 1630  ceFEPIme (MAXIPIME) 1 g in sodium chloride 0.9 % 100 mL IVPB  Status:  Discontinued        1 g 200 mL/hr over 30 Minutes Intravenous Every 24 hours 10/17/20 1600 10/17/20 1629   10/17/20 1630  azithromycin (ZITHROMAX) 500 mg in sodium chloride 0.9 % 250 mL IVPB  Status:  Discontinued        500 mg 250 mL/hr over 60 Minutes Intravenous Every 24 hours 10/17/20 1600 10/17/20 1629        Subjective   Patient refusing her p.o. meds today.  She denies any complaints at this time and no overnight events.  Objective   Vitals:   10/21/20 1125 10/21/20 2152 10/22/20 0512 10/22/20 1209  BP: (!) 158/122 (!) 151/79  (!) 170/97  Pulse: 91 89  91  Resp: 16   20  Temp: 98 F (36.7 C) 98 F (36.7 C)  98.9 F (37.2 C)  TempSrc: Oral     SpO2: 100% 97%  100%  Weight:   95.9 kg    No intake or output data in the 24 hours ending 10/22/20 1424 Filed Weights   10/21/20 0800 10/21/20 1035 10/22/20 0512  Weight: 96 kg 94.8 kg 95.9 kg    Examination:  Physical Exam Vitals and nursing note reviewed.  Constitutional:      Appearance: Normal appearance.  HENT:     Head: Normocephalic and atraumatic.  Eyes:     Conjunctiva/sclera: Conjunctivae normal.  Cardiovascular:     Rate and Rhythm: Normal rate and regular rhythm.  Pulmonary:     Effort: Pulmonary effort is normal.     Breath sounds: Normal breath sounds.  Abdominal:     General: Abdomen is flat.     Palpations: Abdomen is soft.  Musculoskeletal:        General: No swelling or tenderness.     Comments: S/p bilateral BKA  Skin:    Coloration: Skin is not jaundiced or pale.  Neurological:     Mental Status: She is alert. Mental status is at baseline.  Psychiatric:        Mood and Affect: Mood normal.        Behavior: Behavior normal.     Data Reviewed: I have personally reviewed following labs and imaging studies  CBC: Recent Labs  Lab 10/17/20 0653 10/18/20 0540  10/19/20 1015 10/20/20 1038 10/21/20 0818  WBC 4.8 5.0 4.6 4.7 5.6  NEUTROABS  --   --   --  2.9  --   HGB 10.6* 10.1* 9.7* 10.3* 9.8*  HCT 35.8* 34.2* 32.3* 35.4* 32.3*  MCV 87.1 86.6 85.0 90.5 85.2  PLT 89* 94* 85* 85* 99*   Basic Metabolic Panel: Recent Labs  Lab 10/18/20 0540 10/19/20 1015 10/20/20 1038 10/20/20  1720 10/21/20 0818  NA 138 137 136 135 135  K 3.9 3.4* 4.3 5.4* 4.1  CL 97* 97* 100 100 98  CO2 27 24 21* 20* 24  GLUCOSE 102* 106* 162* 111* 137*  BUN 30* 26* 28* 31* 38*  CREATININE 10.19* 8.76* 7.49* 7.81* 8.48*  CALCIUM 9.2 8.7* 8.6* 8.8* 9.2  PHOS 6.5*  --   --  4.7* 4.8*   GFR: CrCl cannot be calculated (Unknown ideal weight.). Liver Function Tests: Recent Labs  Lab 10/17/20 0653 10/18/20 0540 10/20/20 1720 10/21/20 0818  AST 14*  --   --   --   ALT 9  --   --   --   ALKPHOS 85  --   --   --   BILITOT 0.8  --   --   --   PROT 7.3  --   --   --   ALBUMIN 3.5 3.3* 3.2* 3.1*   Recent Labs  Lab 10/17/20 0653  LIPASE 16   No results for input(s): AMMONIA in the last 168 hours. Coagulation Profile: No results for input(s): INR, PROTIME in the last 168 hours. Cardiac Enzymes: No results for input(s): CKTOTAL, CKMB, CKMBINDEX, TROPONINI in the last 168 hours. BNP (last 3 results) No results for input(s): PROBNP in the last 8760 hours. HbA1C: No results for input(s): HGBA1C in the last 72 hours. CBG: Recent Labs  Lab 10/21/20 1141 10/21/20 1529 10/21/20 2002 10/22/20 0806 10/22/20 1203  GLUCAP 67* 134* 141* 72 154*   Lipid Profile: No results for input(s): CHOL, HDL, LDLCALC, TRIG, CHOLHDL, LDLDIRECT in the last 72 hours. Thyroid Function Tests: No results for input(s): TSH, T4TOTAL, FREET4, T3FREE, THYROIDAB in the last 72 hours. Anemia Panel: No results for input(s): VITAMINB12, FOLATE, FERRITIN, TIBC, IRON, RETICCTPCT in the last 72 hours. Sepsis Labs: Recent Labs  Lab 10/18/20 0540  PROCALCITON 0.24    Recent Results (from  the past 240 hour(s))  Gastrointestinal Panel by PCR , Stool     Status: None   Collection Time: 10/17/20  8:53 AM   Specimen: Stool  Result Value Ref Range Status   Campylobacter species NOT DETECTED NOT DETECTED Final   Plesimonas shigelloides NOT DETECTED NOT DETECTED Final   Salmonella species NOT DETECTED NOT DETECTED Final   Yersinia enterocolitica NOT DETECTED NOT DETECTED Final   Vibrio species NOT DETECTED NOT DETECTED Final   Vibrio cholerae NOT DETECTED NOT DETECTED Final   Enteroaggregative E coli (EAEC) NOT DETECTED NOT DETECTED Final   Enteropathogenic E coli (EPEC) NOT DETECTED NOT DETECTED Final   Enterotoxigenic E coli (ETEC) NOT DETECTED NOT DETECTED Final   Shiga like toxin producing E coli (STEC) NOT DETECTED NOT DETECTED Final   Shigella/Enteroinvasive E coli (EIEC) NOT DETECTED NOT DETECTED Final   Cryptosporidium NOT DETECTED NOT DETECTED Final   Cyclospora cayetanensis NOT DETECTED NOT DETECTED Final   Entamoeba histolytica NOT DETECTED NOT DETECTED Final   Giardia lamblia NOT DETECTED NOT DETECTED Final   Adenovirus F40/41 NOT DETECTED NOT DETECTED Final   Astrovirus NOT DETECTED NOT DETECTED Final   Norovirus GI/GII NOT DETECTED NOT DETECTED Final   Rotavirus A NOT DETECTED NOT DETECTED Final   Sapovirus (I, II, IV, and V) NOT DETECTED NOT DETECTED Final    Comment: Performed at [facility], [address], [place], [place] [address]  C Difficile Quick Screen w PCR reflex     Status: None   Collection Time: 10/17/20  8:54 AM  Specimen: Stool  Result Value Ref Range Status   C Diff antigen NEGATIVE NEGATIVE Final   C Diff toxin NEGATIVE NEGATIVE Final   C Diff interpretation No C. difficile detected.  Final    Comment: Performed at [facility], [place] [address] [place], [place] [address]  SARS Coronavirus 2 by RT PCR (hospital order, performed in [facility] hospital lab) Nasopharyngeal Nasopharyngeal Swab     Status: None   Collection  Time: 10/17/20  4:16 PM   Specimen: Nasopharyngeal Swab  Result Value Ref Range Status   SARS Coronavirus 2 NEGATIVE NEGATIVE Final    Comment: (NOTE) SARS-CoV-2 target nucleic acids are NOT DETECTED.  The SARS-CoV-2 RNA is generally detectable in upper and lower respiratory specimens during the acute phase of infection. The lowest concentration of SARS-CoV-2 viral copies this assay can detect is 250 copies / mL. A negative result does not preclude SARS-CoV-2 infection and should not be used as the sole basis for treatment or other patient management decisions.  A negative result may occur with improper specimen collection / handling, submission of specimen other than nasopharyngeal swab, presence of viral mutation(s) within the areas targeted by this assay, and inadequate number of viral copies (<250 copies / mL). A negative result must be combined with clinical observations, patient history, and epidemiological information.  Fact Sheet for Patients:   StrictlyIdeas.no  Fact Sheet for Healthcare Providers: BankingDealers.co.za  This test is not yet approved or  cleared by the [place] FDA and has been authorized for detection and/or diagnosis of SARS-CoV-2 by FDA under an Emergency Use Authorization (EUA).  This EUA will remain in effect (meaning this test can be used) for the duration of the COVID-19 declaration under Section 564(b)(1) of the Act, 21 U.S.C. section 360bbb-3(b)(1), unless the authorization is terminated or revoked sooner.  Performed at [facility], [place] [address], [place], [place] [address]          Radiology Studies: No results found.      Scheduled Meds: . amLODipine  10 mg Oral Daily  . atorvastatin  40 mg Oral Daily  . Chlorhexidine Gluconate Cloth  6 each Topical Q0600  . clopidogrel  75 mg Oral Q breakfast  . doxercalciferol  4 mcg Intravenous Q M,W,F-HD  . feeding supplement  (NEPRO CARB STEADY)  237 mL Oral BID BM  . insulin aspart  0-6 Units Subcutaneous TID WC  . metoprolol succinate  12.5 mg Oral Daily  . multivitamin  1 tablet Oral QHS  . pantoprazole  40 mg Oral QAC breakfast  . sevelamer carbonate  2,400 mg Oral TID WC  . sodium chloride flush  3 mL Intravenous Q12H   Continuous Infusions:    Time spent: 18 minutes with over 50% of the time coordinating the patient's care    Harold Hedge, DO [facility]   Call night coverage person covering after 7pm

## 2020-10-22 NOTE — Progress Notes (Signed)
Physical Therapy Treatment Patient Details Name: Karen [name] MRN: [id number] DOB: April 22, 1976 Today's Date: 10/22/2020    History of Present Illness 45 year old female with past medical history of ESRD on HD (MWF), hypertension, CVA with residual dysarthria, cardiomyopathy, type 2 diabetes with peripheral neuropathy, s/p bilateral BKA. Admitted 1/27 with acute hypoxic respiratory failure, HTN urgency, aspiration pneumonia, diarrhea.    PT Comments    Patient seen for upper body and abdominal strengthening. Patient refused OOB to chair as she wanted to get her bath while in the bed and stated her tech agreed to come soon. Patient cooperative with all exercises trying to focus on triceps and functional mobility.     Follow Up Recommendations  SNF     Equipment Recommendations  None recommended by PT    Recommendations for Other Services       Precautions / Restrictions Precautions Precautions: Fall    Mobility  Bed Mobility Overal bed mobility: Needs Assistance Bed Mobility: Supine to Sit;Sit to Supine;Sidelying to Sit   Sidelying to sit: Modified independent (Device/Increase time) Supine to sit: Modified independent (Device/Increase time) Sit to supine: Modified independent (Device/Increase time)   General bed mobility comments: pt using rails for all mobility; instructed in "walking" hands up bil rails to pull up to sitting and then slowly walking hands back to supine  Transfers                 General transfer comment: pt wanting to get her bath before she gets OOB; attempted to encourage to get to chair and then do bath and she refused  Ambulation/Gait                 Stairs             Wheelchair Mobility    Modified Rankin (Stroke Patients Only)       Balance Overall balance assessment: Needs assistance Sitting-balance support: No upper extremity supported Sitting balance-Leahy Scale: Good Sitting balance - Comments: able to weight  shift long sitting on bed. Lt and right down onto elbows and back to midline bil.                                    Cognition Arousal/Alertness: Awake/alert Behavior During Therapy: WFL for tasks assessed/performed Overall Cognitive Status: Within Functional Limits for tasks assessed                                        Exercises Other Exercises Other Exercises: seated press-ups using fists down into mattress; unable to fully extend her UEs; progressed to pushing down and trying to scoot backwards one hip at a time to progress to [facility] (very limited ability to do this) Other Exercises: Provided green theraband to each upper bed rail and did chest-presses with emphasis on triceps and shoulder extenders x 10 reps Other Exercises: pull from supine to sit (long-sitting) using bil rails and lowered back x 3 reps Other Exercises: rolling emphasizing use of UE crossing over and pulling to work biceps; x 5 each way Other Exercises: prop on elbows behind her (from long-sitting) and try to scoot posteriorly towards HOB scooting one hip and then the other (pt unable, but could achieve propped on bil elbows)    General Comments        Pertinent Vitals/Pain  Pain Assessment: Faces Faces Pain Scale: Hurts a little bit Pain Location: rt distal stump Pain Descriptors / Indicators: Sore Pain Intervention(s): Repositioned;Limited activity within patient's tolerance    Home Living                      Prior Function            PT Goals (current goals can now be found in the care plan section) Acute Rehab PT Goals Patient Stated Goal: Get rehab before going home so she can be more independent. Time For Goal Achievement: 11/03/20 Potential to Achieve Goals: Good Progress towards PT goals: Progressing toward goals    Frequency    Min 3X/week      PT Plan Current plan remains appropriate    Co-evaluation              AM-PAC PT "6 Clicks"  Mobility   Outcome Measure  Help needed turning from your back to your side while in a flat bed without using bedrails?: A Little Help needed moving from lying on your back to sitting on the side of a flat bed without using bedrails?: A Little Help needed moving to and from a bed to a chair (including a wheelchair)?: A Lot Help needed standing up from a chair using your arms (e.g., wheelchair or bedside chair)?: Total Help needed to walk in hospital room?: Total Help needed climbing 3-5 steps with a railing? : Total 6 Click Score: 11    End of Session   Activity Tolerance: Patient tolerated treatment well Patient left: with call bell/phone within reach;in bed Nurse Communication: Other (comment) (pt cooperative with ex's; not OOB) PT Visit Diagnosis: Muscle weakness (generalized) (M62.81);Difficulty in walking, not elsewhere classified (R26.2)     Time: 1341-1415 PT Time Calculation (min) (ACUTE ONLY): 34 min  Charges:  $Therapeutic Exercise: 23-37 mins                      Arby Barrette, PT Pager [phone]    Rexanne Mano 10/22/2020, 2:35 PM

## 2020-10-22 NOTE — Plan of Care (Signed)
  Problem: Education: Goal: Knowledge of General Education information will improve Description Including pain rating scale, medication(s)/side effects and non-pharmacologic comfort measures Outcome: Progressing   

## 2020-10-23 DIAGNOSIS — I69321 Dysphasia following cerebral infarction: Secondary | ICD-10-CM

## 2020-10-23 DIAGNOSIS — D696 Thrombocytopenia, unspecified: Secondary | ICD-10-CM

## 2020-10-23 DIAGNOSIS — N186 End stage renal disease: Secondary | ICD-10-CM

## 2020-10-23 DIAGNOSIS — J690 Pneumonitis due to inhalation of food and vomit: Secondary | ICD-10-CM | POA: Diagnosis not present

## 2020-10-23 DIAGNOSIS — Z992 Dependence on renal dialysis: Secondary | ICD-10-CM

## 2020-10-23 DIAGNOSIS — I10 Essential (primary) hypertension: Secondary | ICD-10-CM

## 2020-10-23 DIAGNOSIS — D638 Anemia in other chronic diseases classified elsewhere: Secondary | ICD-10-CM | POA: Diagnosis not present

## 2020-10-23 DIAGNOSIS — J9601 Acute respiratory failure with hypoxia: Secondary | ICD-10-CM | POA: Diagnosis not present

## 2020-10-23 DIAGNOSIS — E8770 Fluid overload, unspecified: Secondary | ICD-10-CM

## 2020-10-23 DIAGNOSIS — N2581 Secondary hyperparathyroidism of renal origin: Secondary | ICD-10-CM

## 2020-10-23 LAB — GLUCOSE, CAPILLARY
Glucose-Capillary: 116 mg/dL — ABNORMAL HIGH (ref 70–99)
Glucose-Capillary: 89 mg/dL (ref 70–99)
Glucose-Capillary: 84 mg/dL (ref 70–99)
Glucose-Capillary: 155 mg/dL — ABNORMAL HIGH (ref 70–99)

## 2020-10-23 MED ORDER — LACTULOSE 10 GM/15ML PO SOLN: 30.0000 g | Freq: Two times a day (BID) | ORAL | Status: DC | PRN

## 2020-10-23 MED ORDER — PANTOPRAZOLE SODIUM 20 MG PO TBEC: 20.0000 mg | DELAYED_RELEASE_TABLET | Freq: Every day | ORAL | Status: DC

## 2020-10-23 MED ORDER — LACTULOSE 10 GM/15ML PO SOLN: 30.0000 g | Freq: Two times a day (BID) | ORAL | 0 refills | Status: DC | PRN

## 2020-10-23 MED ORDER — CINACALCET HCL 30 MG PO TABS: 30.0000 mg | ORAL_TABLET | ORAL | Status: DC

## 2020-10-23 MED ORDER — CALCITRIOL 0.5 MCG PO CAPS: 1.5000 ug | ORAL_CAPSULE | ORAL | Status: DC

## 2020-10-23 MED ORDER — LACTULOSE 10 GM/15ML PO SOLN
30.0000 g | Freq: Once | ORAL | Status: AC
  Administered 2020-10-23: 30 g via ORAL
  Filled 2020-10-23: qty 45

## 2020-10-23 NOTE — Discharge Summary (Addendum)
Physician Discharge Summary  Karen [name] NOI:[id number] DOB: 01/02/1976 DOA: 10/17/2020  PCP: Patient, No Pcp Per  Admit date: 10/17/2020 Discharge date: 10/23/2020  Admitted From: Home Discharge disposition: SNF  30-day re-admission risk score:  30 Day Unplanned Readmission Risk Score   Flowsheet Row ED to Hosp-Admission (Current) from 10/17/2020 in [facility]  30 Day Unplanned Readmission Risk Score (%) 30.71 Filed at 10/23/2020 0801     This score is the patient's risk of an unplanned readmission within 30 days of being discharged (0 -100%). The score is based on dignosis, age, lab data, medications, orders, and past utilization.   Low:  0-14.9   Medium: 15-21.9   High: 22-29.9   Extreme: 30 and above         Recommendations for Outpatient Follow-Up:   1. Being discharged to SNF for rehab. 2. F/U at HD per usual schedule. 3. Monitor bowel function closely.   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  Diet recommendation: Low sodium, heart healthy.  Carbohydrate-modified.  Regular.  Wound care:  None.  Code status: Full.   History of Present Illness:   45 year old female with past medical history of ESRD on HD (MWF), hypertension, CVA with residual dysarthria, cardiomyopathy, type 2 diabetes with peripheral neuropathy, s/p bilateral BKA who presented to the ED on 10/17/20 with 2 weeks of nonbloody diarrhea with associated midline abdominal pain also noted that she had a cough during this period of time without any significant fevers.  Reported she feels like food goes down the wrong way when eating.  Acute abdominal series was significant for vascular congestion with mild interstitial edema and a normal gas pattern.  C. difficile and GI panel were negative.  CT abdomen pelvis with contrast significant for right posterior lung base pneumonia and interstitial edema in the lungs.  She was started on Rocephin and azithromycin for aspiration pneumonia.  Nephrology was consulted for ESRD and maintained her usual dialysis schedule.   Hospital Course by Problem:   Principle Problems Acute hypoxic respiratory failure likely secondary to volume overload and aspiration pneumonia, POA Dx of acute respiratory failure validated by the presence of cough, SOB and Sp02 of 82% documented on admission which improved with supplemental oxygen.  Imaging showed interstitial edema in lungs and right posterior lung base pneumonia, contributory to her hypoxic respiratory failure.  Both underlying issues addressed as outlined below, with improvement in her oxygen status.   Aspiration pneumonia in the setting of dysphagia from prior CVA, POA, resolved Status post SLP eval consistent with aspiration on assessment and barium swallow. Completed 4 days of antibiotics with resolution  of symptoms.  Volume overload in the setting of ESRD on HD (MWF)/hyperphosphatemia, POA Missed HD on 10/16/2020, possibly the inciting event? Has a pattern of missed and truncated treatments. Continue treatment with binders, treatment of  secondary hyperparathyroidism. F/U HD center post discharge.  Active problem: Hyper/hypokalemia Resolved with HD.  Hypertension, POA, chronic Continue amlodipine and metoprolol can encourage compliance.  Anemia of chronic disease, POA, chronic Given Aranesp per nephro with HD 10/19/20.  Type 2 diabetes with long term use of insulin, with renal complications, chronic Resume lantus at discharge.  Nonbloody diarrhea of unknown etiology, resolved Possibly from viral syndrome?  PAD/HLD Continue statin, Plavix, aspirin  Acute on chronic thrombocytopenia Platelet count stable at discharge.    Medical Consultants:    Nephrology   Discharge Exam:   Vitals:   10/22/20 2300 10/23/20 0536  BP: (!) 155/88 (!) 147/76  Pulse: 95 95  Resp: 20 20  Temp: 98.2 F (36.8 C) 98.1 F (36.7 C)  SpO2: 99% 100%   Vitals:   10/22/20 1209 10/22/20 2123 10/22/20 2300 10/23/20 0536  BP: (!) 170/97 (!) 187/109 (!) 155/88 (!) 147/76  Pulse: 91 92 95 95  Resp: '20 20 20 20  ' Temp: 98.9 F (37.2 C) 97.9 F (36.6 C) 98.2 F (36.8 C) 98.1 F (36.7 C)  TempSrc:  Oral Oral Oral  SpO2: 100% 98% 99% 100%  Weight:        General exam: Anxious appearing, upset that her bowels haven't moved. Respiratory system: Clear to auscultation. Respiratory effort normal. Cardiovascular system: S1 & S2 heard, RRR. No JVD,  rubs, gallops or clicks. No murmurs. Gastrointestinal system: Abdomen is nondistended, soft and nontender. No organomegaly or masses felt. Normal bowel sounds heard. Central nervous system: Alert, non-focal Extremities: Bilateral BKA's. Skin: No rashes, lesions or ulcers. Psychiatry: Judgement and insight appear impaired. Mood & affect anxious.   The results of significant diagnostics from this hospitalization (including imaging, microbiology, ancillary and laboratory) are listed below for reference.     Procedures and Diagnostic Studies:   CT ABDOMEN PELVIS W  CONTRAST  Result Date: 10/17/2020 CLINICAL DATA:  Diarrhea and vomiting.  Chronic renal failure EXAM: CT ABDOMEN AND PELVIS WITH CONTRAST TECHNIQUE: Multidetector CT imaging of the abdomen and pelvis was performed using the standard protocol following bolus administration of intravenous contrast. CONTRAST:  122m OMNIPAQUE IOHEXOL 300 MG/ML  SOLN COMPARISON:  CT abdomen and pelvis July 19, 2020 FINDINGS: Lower chest: There is airspace opacity in the posterior right base consistent with pneumonia. There is mild bibasilar interstitial pulmonary edema. There is cardiomegaly with multiple foci of coronary artery calcification. Hepatobiliary: Liver measures 23.0 cm in length. There is decreased attenuation in the liver, likely due to hepatic steatosis. Liver contour is subtly nodular. No focal liver lesions are evident. Gallbladder is contracted. There is no appreciable biliary duct dilatation. Pancreas: There is no pancreatic mass or inflammatory focus. Spleen: No splenic lesions are evident. Adrenals/Urinary Tract: Adrenals bilaterally appear normal. No evident renal mass. No hydronephrosis on either side. There is extensive peripheral renal artery calcification. No evident renal or ureteral calculus. Urinary bladder is midline with wall thickness borderline increased. Stomach/Bowel: There is no appreciable bowel wall or mesenteric thickening. No evident bowel obstruction. The terminal ileum appears normal. There is no free air or portal venous air. Vascular/Lymphatic: No abdominal aortic aneurysm. There is aortic and pelvic arterial vascular calcification. Calcification is also noted in peripheral renal arteries as well as in portions of the hepatic artery. No  adenopathy is appreciable in the abdomen or pelvis. Reproductive: Uterus is anteverted. Periuterine arterial vascular calcification noted bilaterally. No adnexal masses are evident. Other: Appendix appears normal. There is no evident abscess or ascites in  the abdomen pelvis. There is mild edema in the abdominal wall, a likely degree of mild anasarca. There is mild fat in the umbilicus. Musculoskeletal: No blastic or lytic bone lesions. Foci of degenerative change noted in the lumbar spine. No intramuscular lesions are evident. IMPRESSION: 1. Apparent focus of pneumonia in the posterior right base. There is apparent degree of interstitial edema in the lung bases. 2. Enlarged liver with subtle nodular contour of the liver suggesting underlying cirrhosis. No focal liver lesions are appreciable. 3. No bowel obstruction. No abscess in the abdomen or pelvis. Appendix appears normal. 4. Aortic Atherosclerosis (ICD10-I70.0). Extensive arterial and pelvic vascular calcification throughout the abdomen and pelvis. Foci of coronary artery calcification noted. 5.  Mild anasarca in the abdominal wall. Electronically Signed   By: Lowella Grip III M.D.   On: 10/17/2020 12:01   DG Abdomen Acute W/Chest  Result Date: 10/17/2020 CLINICAL DATA:  Abdominal pain, diarrhea.  Dialysis patient EXAM: DG ABDOMEN ACUTE WITH 1 VIEW CHEST COMPARISON:  Chest 10/02/2020 FINDINGS: Cardiac enlargement. Vascular congestion with mild interstitial edema. Small right effusion. Normal bowel gas pattern. No obstruction or free air. No renal calculi. Diffuse arterial calcification. IMPRESSION: Vascular congestion with mild interstitial edema and small right effusion Normal bowel gas pattern. Electronically Signed   By: Franchot Gallo M.D.   On: 10/17/2020 08:06   DG Swallowing Func-Speech Pathology  Result Date: 10/18/2020 Objective Swallowing Evaluation: Type of Study: MBS-Modified Barium Swallow Study  Patient Details Name: CHONTE RICKE MRN: [id number] Date of Birth: 05-Jun-1976 Today's Date: 10/18/2020 Time: SLP Start Time (ACUTE ONLY): 1045 -SLP Stop Time (ACUTE ONLY): 1100 SLP Time Calculation (min) (ACUTE ONLY): 15 min Past Medical History: Past Medical History: Diagnosis Date . Anemia  .  Anxiety  . Eczema  . ESRD (end stage renal disease) ([place])   [place] - [facility]- [place] . Exertional shortness of breath  . GERD (gastroesophageal reflux disease)  . Headache  . History of blood transfusion  . History of kidney stones   passed . Hypertension  . Ischemic cardiomyopathy   by echo 2014 . Neuropathy  . Pneumonia 09/2016 . Type II diabetes mellitus ([place]) 1995 Past Surgical History: Past Surgical History: Procedure Laterality Date . ABDOMINAL AORTOGRAM W/LOWER EXTREMITY Right 04/03/2020  Procedure: ABDOMINAL AORTOGRAM W/LOWER EXTREMITY;  Surgeon: Marty Heck, MD;  Location: [facility];  Service: Cardiovascular;  Laterality: Right; . AMPUTATION Left 02/06/2013  Procedure: AMPUTATION LEFT GREAT TOE;  Surgeon: Wylene Simmer, MD;  Location: [facility];  Service: Orthopedics;  Laterality: Left; . AMPUTATION Left 06/24/2013  Procedure: AMPUTATION BELOW KNEE ;  Surgeon: Wylene Simmer, MD;  Location: [facility];  Service: Orthopedics;  Laterality: Left; . AMPUTATION Right 04/10/2020  Procedure: RIGHT AMPUTATION BELOW KNEE;  Surgeon: Waynetta Sandy, MD;  Location: [facility];  Service: Vascular;  Laterality: Right; . AV FISTULA PLACEMENT Right 02/08/2017  Procedure: CREATION OF RIGHT ARM  BASILIC VEIN TO BRACHIAL ARTERY ARTERIOVENOUS (AV) FISTULA;  Surgeon: Rosetta Posner, MD;  Location: [facility];  Service: Vascular;  Laterality: Right; . AV FISTULA PLACEMENT Left 09/10/2017  Procedure: INSERTION OF ARTERIOVENOUS (AV) GORE-TEX GRAFT  LEFT UPPER ARM;  Surgeon: Angelia Mould, MD;  Location: [facility];  Service: Vascular;  Laterality: Left; . BASCILIC VEIN TRANSPOSITION Right  04/14/2017  Procedure: BASCILIC VEIN TRANSPOSITION-RIGHT 2ND STAGE;  Surgeon: Rosetta Posner, MD;  Location: [facility];  Service: Vascular;  Laterality: Right; . CESAREAN SECTION  10/18/2006 . INSERTION OF DIALYSIS CATHETER   . PERIPHERAL VASCULAR BALLOON ANGIOPLASTY Right 04/03/2020  Procedure: PERIPHERAL VASCULAR BALLOON ANGIOPLASTY;  Surgeon: Marty Heck, MD;  Location: [facility];  Service: Cardiovascular;  Laterality: Right;  SFA . WOUND DEBRIDEMENT Right 04/04/2020  Procedure: RIGHT FOOT DEBRIDEMENT;  Surgeon: Waynetta Sandy, MD;  Location: [facility];  Service: Vascular;  Laterality: Right; HPI: 45 y.o. female,  with a past medical history significant for ESRD on dialysis (T, Th, S), HTN, stroke,T2DM, PAD s/p left BKA, ischemic cardiomyopathy presenting with diarrhea. CT scan of the abdomen and pelvis with contrast significant for focus of pneumonia in the right posterior lung base and degree of interstitial edema in the lungs. Pt reports concern about aspiration. Pt followed by SLP services during Dec 2018 admission for CVA (infarct left pons, right parietal lobe, right corpus callosum/cingulate gyrus) - was found to have a mild dysphagia/dysarthria.  She reports increased episodes of choking with meals PTA. Last MBS completed on 01/28/2018 showing sensed peentration of thin liquids. Pt has ongoing spastic dysarthria and hopes for improvement, she reports she had an SLP working with her at home as recently as 6 months ago.  No data recorded Assessment / Plan / Recommendation CHL IP CLINICAL IMPRESSIONS 10/18/2020 Clinical Impression Pt demonstrates variable ability to time and execute oral and pharyngeal movements during swallowing, at worst leading to trace frank aspiration before the swallow. Oral phase is characterized by slow oral manipulation of bolus, with increased time for posterior propulsion if pt is given a motor command to execute.  Primarily problem is premature spillage causing trace silent penetration. A verbal cue to orally hold the bolus prior to starting the swallow slows down the process, but decreases instance of penetration. Boluses may reach the pharynx with timely laryngeal protection, or occasionally spill and pool in the pyriforms prior to swallow initiation. Overall, pts function is stable, we discussed  compensatory strategies, pt to continue current diet. No acute SLP f/u needed, pt would benefit from f/u with [facility] SLP.  SLP Visit Diagnosis Dysphagia, oropharyngeal phase (R13.12) Attention and concentration deficit following -- Frontal lobe and executive function deficit following -- Impact on safety and function Mild aspiration risk   CHL IP TREATMENT RECOMMENDATION 10/18/2020 Treatment Recommendations No treatment recommended at this time   Prognosis 09/11/2017 Prognosis for Safe Diet Advancement Good Barriers to Reach Goals Cognitive deficits Barriers/Prognosis Comment -- CHL IP DIET RECOMMENDATION 10/18/2020 SLP Diet Recommendations Regular solids;Thin liquid Liquid Administration via Cup;Straw Medication Administration -- Compensations -- Postural Changes --   CHL IP OTHER RECOMMENDATIONS 10/18/2020 Recommended Consults -- Oral Care Recommendations Oral care BID Other Recommendations --   CHL IP FOLLOW UP RECOMMENDATIONS 10/18/2020 Follow up Recommendations 24 hour supervision/assistance;Home health SLP   CHL IP FREQUENCY AND DURATION 04/11/2020 Speech Therapy Frequency (ACUTE ONLY) min 2x/week Treatment Duration --      CHL IP ORAL PHASE 10/18/2020 Oral Phase Impaired Oral - Pudding Teaspoon -- Oral - Pudding Cup -- Oral - Honey Teaspoon -- Oral - Honey Cup -- Oral - Nectar Teaspoon -- Oral - Nectar Cup -- Oral - Nectar Straw -- Oral - Thin Teaspoon -- Oral - Thin Cup Premature spillage;Delayed oral transit;Holding of bolus Oral - Thin Straw Premature spillage;Delayed oral transit;Holding of bolus Oral - Puree Delayed oral transit;Holding of  bolus Oral - Mech Soft -- Oral - Regular Delayed oral transit;Holding of bolus Oral - Multi-Consistency -- Oral - Pill Premature spillage;Delayed oral transit;Holding of bolus Oral Phase - Comment --  CHL IP PHARYNGEAL PHASE 10/18/2020 Pharyngeal Phase Impaired Pharyngeal- Pudding Teaspoon -- Pharyngeal -- Pharyngeal- Pudding Cup -- Pharyngeal -- Pharyngeal- Honey Teaspoon --  Pharyngeal -- Pharyngeal- Honey Cup -- Pharyngeal -- Pharyngeal- Nectar Teaspoon -- Pharyngeal -- Pharyngeal- Nectar Cup -- Pharyngeal -- Pharyngeal- Nectar Straw -- Pharyngeal -- Pharyngeal- Thin Teaspoon -- Pharyngeal -- Pharyngeal- Thin Cup Delayed swallow initiation-vallecula;Delayed swallow initiation-pyriform sinuses;Penetration/Aspiration before swallow Pharyngeal Material enters airway, CONTACTS cords and not ejected out;Material does not enter airway Pharyngeal- Thin Straw Delayed swallow initiation-pyriform sinuses;Delayed swallow initiation-vallecula;Penetration/Aspiration before swallow Pharyngeal Material enters airway, CONTACTS cords and not ejected out;Material does not enter airway Pharyngeal- Puree Delayed swallow initiation-vallecula;Delayed swallow initiation-pyriform sinuses Pharyngeal -- Pharyngeal- Mechanical Soft Delayed swallow initiation-vallecula;Delayed swallow initiation-pyriform sinuses Pharyngeal -- Pharyngeal- Regular -- Pharyngeal -- Pharyngeal- Multi-consistency -- Pharyngeal -- Pharyngeal- Pill -- Pharyngeal -- Pharyngeal Comment --  CHL IP CERVICAL ESOPHAGEAL PHASE 01/28/2018 Cervical Esophageal Phase WFL Pudding Teaspoon -- Pudding Cup -- Honey Teaspoon -- Honey Cup -- Nectar Teaspoon -- Nectar Cup -- Nectar Straw -- Thin Teaspoon -- Thin Cup -- Thin Straw -- Puree -- Mechanical Soft -- Regular -- Multi-consistency -- Pill -- Cervical Esophageal Comment -- Herbie Baltimore, MA CCC-SLP Acute Rehabilitation Services Pager [phone] Office [phone] Lynann Beaver 10/18/2020, 12:06 PM                Labs:   Basic Metabolic Panel: Recent Labs  Lab 10/18/20 0540 10/19/20 1015 10/20/20 1038 10/20/20 1720 10/21/20 0818  NA 138 137 136 135 135  K 3.9 3.4* 4.3 5.4* 4.1  CL 97* 97* 100 100 98  CO2 27 24 21* 20* 24  GLUCOSE 102* 106* 162* 111* 137*  BUN 30* 26* 28* 31* 38*  CREATININE 10.19* 8.76* 7.49* 7.81* 8.48*  CALCIUM 9.2 8.7* 8.6* 8.8* 9.2  PHOS 6.5*   --   --  4.7* 4.8*   GFR CrCl cannot be calculated (Unknown ideal weight.). Liver Function Tests: Recent Labs  Lab 10/17/20 0653 10/18/20 0540 10/20/20 1720 10/21/20 0818  AST 14*  --   --   --   ALT 9  --   --   --   ALKPHOS 85  --   --   --   BILITOT 0.8  --   --   --   PROT 7.3  --   --   --   ALBUMIN 3.5 3.3* 3.2* 3.1*   Recent Labs  Lab 10/17/20 0653  LIPASE 16   No results for input(s): AMMONIA in the last 168 hours. Coagulation profile No results for input(s): INR, PROTIME in the last 168 hours.  CBC: Recent Labs  Lab 10/17/20 0653 10/18/20 0540 10/19/20 1015 10/20/20 1038 10/21/20 0818  WBC 4.8 5.0 4.6 4.7 5.6  NEUTROABS  --   --   --  2.9  --   HGB 10.6* 10.1* 9.7* 10.3* 9.8*  HCT 35.8* 34.2* 32.3* 35.4* 32.3*  MCV 87.1 86.6 85.0 90.5 85.2  PLT 89* 94* 85* 85* 99*   CBG: Recent Labs  Lab 10/22/20 0806 10/22/20 1203 10/22/20 1726 10/22/20 2119 10/23/20 0848  GLUCAP 72 154* 119* 152* 116*   Microbiology Recent Results (from the past 240 hour(s))  Gastrointestinal Panel by PCR , Stool     Status: None   Collection Time: 10/17/20  8:53 AM   Specimen: Stool  Result Value Ref Range Status   Campylobacter species NOT DETECTED NOT DETECTED Final   Plesimonas shigelloides NOT DETECTED NOT DETECTED Final   Salmonella species NOT DETECTED NOT DETECTED Final   Yersinia enterocolitica NOT DETECTED NOT DETECTED Final   Vibrio species NOT DETECTED NOT DETECTED Final   Vibrio cholerae NOT DETECTED NOT DETECTED Final   Enteroaggregative E coli (EAEC) NOT DETECTED NOT DETECTED Final   Enteropathogenic E coli (EPEC) NOT DETECTED NOT DETECTED Final   Enterotoxigenic E coli (ETEC) NOT DETECTED NOT DETECTED Final   Shiga like toxin producing E coli (STEC) NOT DETECTED NOT DETECTED Final   Shigella/Enteroinvasive E coli (EIEC) NOT DETECTED NOT DETECTED Final   Cryptosporidium NOT DETECTED NOT DETECTED Final   Cyclospora cayetanensis NOT DETECTED NOT DETECTED  Final   Entamoeba histolytica NOT DETECTED NOT DETECTED Final   Giardia lamblia NOT DETECTED NOT DETECTED Final   Adenovirus F40/41 NOT DETECTED NOT DETECTED Final   Astrovirus NOT DETECTED NOT DETECTED Final   Norovirus GI/GII NOT DETECTED NOT DETECTED Final   Rotavirus A NOT DETECTED NOT DETECTED Final   Sapovirus (I, II, IV, and V) NOT DETECTED NOT DETECTED Final    Comment: Performed at [facility], [address] [place], [place] [address]  C Difficile Quick Screen w PCR reflex     Status: None   Collection Time: 10/17/20  8:54 AM   Specimen: Stool  Result Value Ref Range Status   C Diff antigen NEGATIVE NEGATIVE Final   C Diff toxin NEGATIVE NEGATIVE Final   C Diff interpretation No C. difficile detected.  Final    Comment: Performed at [facility], [place] [address], [place], [place] [address]  SARS Coronavirus 2 by RT PCR (hospital order, performed in [facility] hospital lab) Nasopharyngeal Nasopharyngeal Swab     Status: None   Collection Time: 10/17/20  4:16 PM   Specimen: Nasopharyngeal Swab  Result Value Ref Range Status   SARS Coronavirus 2 NEGATIVE NEGATIVE Final    Comment: (NOTE) SARS-CoV-2 target nucleic acids are NOT DETECTED.  The SARS-CoV-2 RNA is generally detectable in upper and lower respiratory specimens during the acute phase of infection. The lowest concentration of SARS-CoV-2 viral copies this assay can detect is 250 copies / mL. A negative result does not preclude SARS-CoV-2 infection and should not be used as the sole basis for treatment or other patient management decisions.  A negative result may occur with improper specimen collection / handling, submission of specimen other than nasopharyngeal swab, presence of viral mutation(s) within the areas targeted by this assay, and inadequate number of viral copies (<250 copies / mL). A negative result must be combined with clinical observations, patient history, and epidemiological  information.  Fact Sheet for Patients:   StrictlyIdeas.no  Fact Sheet for Healthcare Providers: BankingDealers.co.za  This test is not yet approved or  cleared by the [place] FDA and has been authorized for detection and/or diagnosis of SARS-CoV-2 by FDA under an Emergency Use Authorization (EUA).  This EUA will remain in effect (meaning this test can be used) for the duration of the COVID-19 declaration under Section 564(b)(1) of the Act, 21 U.S.C. section 360bbb-3(b)(1), unless the authorization is terminated or revoked sooner.  Performed at [facility], [place] [address], [place], [place] [address]      Discharge Instructions:   Discharge Instructions    Call MD for:  extreme fatigue   Complete  by: As directed    Call MD for:  persistant nausea and vomiting   Complete by: As directed    Call MD for:  severe uncontrolled pain   Complete by: As directed    Call MD for:  temperature >100.4   Complete by: As directed    Diet - low sodium heart healthy   Complete by: As directed    Diet Carb Modified   Complete by: As directed    Increase activity slowly   Complete by: As directed      Allergies as of 10/23/2020      Reactions   Baclofen Itching   Penicillins Anaphylaxis, Hives, Rash, Other (See Comments)   PATIENT HAD A PCN REACTION WITH IMMEDIATE RASH, FACIAL/TONGUE/THROAT SWELLING, SOB, OR LIGHTHEADEDNESS WITH HYPOTENSION:  #  #  #  YES  #  #  #   SEVERE RASH INVOLVING MUCUS MEMBRANES or SKIN NECROSIS: #  #  #  YES  #  #  # Has patient had a PCN reaction that required hospitalization No Has patient had a PCN reaction occurring within the last 10 years: No If all of the above answers are "NO", then may proceed with Cephalosporin use. 09/10/16- tolerated Cefepime   Morphine And Related Hives, Rash   Novolog [insulin Aspart] Other (See Comments)   Cramps/ Gi distress      Medication List    STOP taking these  medications   Amitiza 24 MCG capsule Generic drug: lubiprostone   furosemide 20 MG tablet Commonly known as: LASIX   oxyCODONE 5 MG immediate release tablet Commonly known as: Oxy IR/ROXICODONE     TAKE these medications   amLODipine 10 MG tablet Commonly known as: NORVASC Take 10 mg by mouth daily.   aspirin EC 81 MG tablet Take 81 mg by mouth daily. Swallow whole.   atorvastatin 40 MG tablet Commonly known as: LIPITOR Take 40 mg by mouth daily.   calcitRIOL 0.5 MCG capsule Commonly known as: ROCALTROL Take 3 capsules (1.5 mcg total) by mouth every Monday, Wednesday, and Friday.   calcium carbonate 1250 (500 Ca) MG chewable tablet Commonly known as: OS-CAL Chew 1 tablet by mouth 3 (three) times daily.   cinacalcet 30 MG tablet Commonly known as: SENSIPAR Take 1 tablet (30 mg total) by mouth every Monday, Wednesday, and Friday with hemodialysis.   clonazePAM 1 MG tablet Commonly known as: KLONOPIN Take 1 mg by mouth 2 (two) times daily as needed for anxiety.   clopidogrel 75 MG tablet Commonly known as: PLAVIX Take 1 tablet (75 mg total) by mouth daily with breakfast.   Darbepoetin Alfa 200 MCG/0.4ML Sosy injection Commonly known as: ARANESP Inject 0.4 mLs (200 mcg total) into the vein every Thursday with hemodialysis.   hydrALAZINE 25 MG tablet Commonly known as: APRESOLINE Take 25 mg by mouth daily.   insulin glargine 100 UNIT/ML injection Commonly known as: LANTUS Inject 9 Units into the skin at bedtime.   lactulose 10 GM/15ML solution Commonly known as: CHRONULAC Take 45 mLs (30 g total) by mouth 2 (two) times daily as needed for mild constipation.   lidocaine-prilocaine cream Commonly known as: EMLA Apply 1 application topically daily as needed (pain).   metoprolol succinate 25 MG 24 hr tablet Commonly known as: TOPROL-XL Take 0.5 tablets (12.5 mg total) by mouth daily.   multivitamin Tabs tablet Take 1 tablet by mouth at bedtime.   Narcan 4  MG/0.1ML Liqd nasal spray kit Generic drug: naloxone Place 0.4  mg into the nose once. Repeat after 3 minutes if no or minimal response   NovoFine 32G X 6 MM Misc Generic drug: Insulin Pen Needle   ondansetron 4 MG disintegrating tablet Commonly known as: ZOFRAN-ODT Take 1 tablet (4 mg total) by mouth every 8 (eight) hours as needed for nausea or vomiting.   pantoprazole 20 MG tablet Commonly known as: PROTONIX Take 1 tablet (20 mg total) by mouth daily before breakfast. What changed: See the new instructions.       Follow-up [facility].   Specialty: Gastroenterology Contact information: [address] [place] [address] [phone]               Time coordinating discharge: 40 minutes.  Signed:  Jacquelynn Cree, MD   [facility] 10/23/2020, 10:11 AM

## 2020-10-23 NOTE — Plan of Care (Signed)
  Problem: Education: Goal: Knowledge of General Education information will improve Description Including pain rating scale, medication(s)/side effects and non-pharmacologic comfort measures Outcome: Progressing   

## 2020-10-23 NOTE — Progress Notes (Signed)
Physical Therapy Treatment Patient Details Name: Karen [name] MRN: [id number] DOB: 12-14-1975 Today's Date: 10/23/2020    History of Present Illness 45 year old female with past medical history of ESRD on HD (MWF), hypertension, CVA with residual dysarthria, cardiomyopathy, type 2 diabetes with peripheral neuropathy, s/p bilateral BKA. Admitted 1/27 with acute hypoxic respiratory failure, HTN urgency, aspiration pneumonia, diarrhea.    PT Comments    Pt declining OOB to chair as pt has has freq bowel movement as pt was given something last night to "loosen my stool." Max encouragement provided however pt continue to decline. Pt did complete LE ther ex in the bed 2 sets of 10. Pt with soft voice and labored efforts with exercises. Pt kept eyes closed but denies being sleepy. Continue to recommend SNF upon d/c. Acute PT to cont to follow.   Follow Up Recommendations  SNF     Equipment Recommendations  None recommended by PT    Recommendations for Other Services       Precautions / Restrictions Precautions Precautions: Fall Precaution Comments: bilat BKA Restrictions Weight Bearing Restrictions: No    Mobility  Bed Mobility Overal bed mobility: Needs Assistance Bed Mobility: Rolling Rolling: Supervision         General bed mobility comments: pt able to use bed rail and roll L and R and pull self up into long sit in the bed, no physical assist needed and HOB elevated to 40 deg  Transfers                 General transfer comment: pt declined due to freq bowel movements  Ambulation/Gait             General Gait Details: unable, bilat bka, no prosthesis   Stairs             Wheelchair Mobility    Modified Rankin (Stroke Patients Only)       Balance Overall balance assessment: Needs assistance     Sitting balance - Comments: pt pulling self up into long sit in the bed                                    Cognition  Arousal/Alertness: Awake/alert Behavior During Therapy: WFL for tasks assessed/performed Overall Cognitive Status: No family/caregiver present to determine baseline cognitive functioning                                 General Comments: delayed response time, anxious regard freq bowel movements since receiving medicine last night, refusing to get OOB to chair      Exercises General Exercises - Lower Extremity Quad Sets: AROM;5 reps;10 reps;Supine Gluteal Sets: AROM;Both;10 reps;Sidelying Hip ABduction/ADduction: AROM;Both;10 reps;Supine Hip Flexion/Marching: AROM;Both;10 reps;Supine    General Comments        Pertinent Vitals/Pain Pain Assessment: No/denies pain    Home Living                      Prior Function            PT Goals (current goals can now be found in the care plan section) Progress towards PT goals: Progressing toward goals    Frequency    Min 3X/week      PT Plan Current plan remains appropriate    Co-evaluation  AM-PAC PT "6 Clicks" Mobility   Outcome Measure  Help needed turning from your back to your side while in a flat bed without using bedrails?: A Little Help needed moving from lying on your back to sitting on the side of a flat bed without using bedrails?: A Little Help needed moving to and from a bed to a chair (including a wheelchair)?: A Lot Help needed standing up from a chair using your arms (e.g., wheelchair or bedside chair)?: Total Help needed to walk in hospital room?: Total   6 Click Score: 10    End of Session Equipment Utilized During Treatment: Gait belt Activity Tolerance: Patient tolerated treatment well Patient left: in bed;with call bell/phone within reach;with bed alarm set Nurse Communication: Mobility status PT Visit Diagnosis: Muscle weakness (generalized) (M62.81);Difficulty in walking, not elsewhere classified (R26.2)     Time: 2010-0712 PT Time Calculation (min)  (ACUTE ONLY): 22 min  Charges:  $Therapeutic Exercise: 8-22 mins                     Kittie Plater, PT, DPT Acute Rehabilitation Services Pager #: [phone] Office #: [phone]    Berline Lopes 10/23/2020, 3:18 PM

## 2020-10-23 NOTE — Care Management Important Message (Signed)
Important Message  Patient Details  Name: Karen [name] MRN: [id number] Date of Birth: 1976-03-06   Medicare Important Message Given:  Yes     Orbie Pyo 10/23/2020, 11:23 AM

## 2020-10-23 NOTE — Progress Notes (Signed)
Patient ID: Karen [name], female   DOB: [birth year], 45 y.o.   MRN: [id number] S: Pt complaining of constipation and refused to go to HD this morning O:BP (!) 147/76 (BP Location: Right Arm)   Pulse 95   Temp 98.1 F (36.7 C) (Oral)   Resp 20   Wt 95.9 kg   LMP  (LMP Unknown)   SpO2 100%   BMI 34.14 kg/m  No intake or output data in the 24 hours ending 10/23/20 0937 Intake/Output: No intake/output data recorded.  Intake/Output this shift:  No intake/output data recorded. Weight change:  Gen: NAD CVS: RRR Resp: cta Abd: benign Ext: LAVG +T/B  Recent Labs  Lab 10/17/20 0653 10/18/20 0540 10/19/20 1015 10/20/20 1038 10/20/20 1720 10/21/20 0818  NA 141 138 137 136 135 135  K 3.5 3.9 3.4* 4.3 5.4* 4.1  CL 98 97* 97* 100 100 98  CO2 27 27 24  21* 20* 24  GLUCOSE 110* 102* 106* 162* 111* 137*  BUN 23* 30* 26* 28* 31* 38*  CREATININE 9.52* 10.19* 8.76* 7.49* 7.81* 8.48*  ALBUMIN 3.5 3.3*  --   --  3.2* 3.1*  CALCIUM 9.6 9.2 8.7* 8.6* 8.8* 9.2  PHOS  --  6.5*  --   --  4.7* 4.8*  AST 14*  --   --   --   --   --   ALT 9  --   --   --   --   --    Liver Function Tests: Recent Labs  Lab 10/17/20 0653 10/18/20 0540 10/20/20 1720 10/21/20 0818  AST 14*  --   --   --   ALT 9  --   --   --   ALKPHOS 85  --   --   --   BILITOT 0.8  --   --   --   PROT 7.3  --   --   --   ALBUMIN 3.5 3.3* 3.2* 3.1*   Recent Labs  Lab 10/17/20 0653  LIPASE 16   No results for input(s): AMMONIA in the last 168 hours. CBC: Recent Labs  Lab 10/17/20 0653 10/18/20 0540 10/19/20 1015 10/20/20 1038 10/21/20 0818  WBC 4.8 5.0 4.6 4.7 5.6  NEUTROABS  --   --   --  2.9  --   HGB 10.6* 10.1* 9.7* 10.3* 9.8*  HCT 35.8* 34.2* 32.3* 35.4* 32.3*  MCV 87.1 86.6 85.0 90.5 85.2  PLT 89* 94* 85* 85* 99*   Cardiac Enzymes: No results for input(s): CKTOTAL, CKMB, CKMBINDEX, TROPONINI in the last 168 hours. CBG: Recent Labs  Lab 10/22/20 0806 10/22/20 1203 10/22/20 1726 10/22/20 2119  10/23/20 0848  GLUCAP 72 154* 119* 152* 116*    Iron Studies: No results for input(s): IRON, TIBC, TRANSFERRIN, FERRITIN in the last 72 hours. Studies/Results: No results found. Marland Kitchen amLODipine  10 mg Oral Daily  . atorvastatin  40 mg Oral Daily  . Chlorhexidine Gluconate Cloth  6 each Topical Q0600  . clopidogrel  75 mg Oral Q breakfast  . doxercalciferol  4 mcg Intravenous Q M,W,F-HD  . feeding supplement (NEPRO CARB STEADY)  237 mL Oral BID BM  . insulin aspart  0-6 Units Subcutaneous TID WC  . metoprolol succinate  12.5 mg Oral Daily  . multivitamin  1 tablet Oral QHS  . pantoprazole  40 mg Oral QAC breakfast  . sevelamer carbonate  2,400 mg Oral TID WC  . sodium chloride flush  3 mL Intravenous  Q12H    BMET    Component Value Date/Time   NA 135 10/21/2020 0818   K 4.1 10/21/2020 0818   CL 98 10/21/2020 0818   CO2 24 10/21/2020 0818   GLUCOSE 137 (H) 10/21/2020 0818   BUN 38 (H) 10/21/2020 0818   CREATININE 8.48 (H) 10/21/2020 0818   CALCIUM 9.2 10/21/2020 0818   GFRNONAA 6 (L) 10/21/2020 0818   GFRAA 6 (L) 04/15/2020 1225   CBC    Component Value Date/Time   WBC 5.6 10/21/2020 0818   RBC 3.79 (L) 10/21/2020 0818   HGB 9.8 (L) 10/21/2020 0818   HCT 32.3 (L) 10/21/2020 0818   PLT 99 (L) 10/21/2020 0818   MCV 85.2 10/21/2020 0818   MCH 25.9 (L) 10/21/2020 0818   MCHC 30.3 10/21/2020 0818   RDW 19.9 (H) 10/21/2020 0818   LYMPHSABS 1.0 10/20/2020 1038   MONOABS 0.6 10/20/2020 1038   EOSABS 0.1 10/20/2020 1038   BASOSABS 0.0 10/20/2020 1038     Dialysis Orders:Dialysis Orders:[facility] MWF 4 hrs 180NRe 400/80094kg 2.0 K/ 2.0Ca UFP 2 L AVG -Heparin3000 units IV TIW -Hectorol59mcg IV TIW -Mircera148mcg IV q 2 weeks (Last dose01/14/2022) -Venofer 100 mg IV X 5 doses 1/5 doses given  Assessment/Plan: 1. Volume overload: She appears to have lost body weight, missed and truncated treatments. Lower volume as tolerated. BNP not a reliable marker in  ESRD.3 liters removed in HD 10/19/20. Continue lowering volume as tolerated. 2. Acute hypoxic respiratory failure: Believe primary issue is volume overload D/T truncated and missed HD. Also being treated for PNA per primary.  3. Aspiration PNA. WBC 5.0, afebrile. ABX per primary. COVID negative. 4. ESRD - MWF. Continues pattern of missed and truncated treatments. Back on outpatient schedule but now refused to come to dialysis today.  Not sure when we will fit her in given large inpatient census. 5. Hypertension/volume -Volume as noted above. Hypertensive on admission. Resumed amlodipine and metoprolol. BP more stable.Net UF 3 liters with HD 01/29. No post wt. Order daily wts. UF as tolerated tomorrow. 6. Anemia - HGB 10.1 Next dose due01/28/2022.Rec'dAranesp 40 mcg IV with HD 10/19/20 7. Metabolic bone disease - C Ca 9.76 PO4 elevated. Chronic issue with noncompliance with binders. Continue binders/VDRA.Checking PO4 today. 8. Nutrition - Albumin 3.3. Renal/Carb mod diet with nepro, renal vit.Asking for regular diet which she eats at home. K+ OK. Regular diet. 9. DM-per primary 10. Thrombocytopenia- follow platelets.   53. H/O CVA. Speech study today. No treatment recommended. 12. Constipation- will try lactulose. 13. Social issues: Askingfor SNF placement.   Donetta Potts, MD [employer] [phone]

## 2020-10-23 NOTE — TOC Progression Note (Addendum)
Transition of Care [facility]) - Progression Note    Patient Details  Name: Karen [name] MRN: [id number] Date of Birth: 11-Feb-1976  Transition of Care [facility]) CM/SW Contact  Joanne Chars, LCSW Phone Number: 10/23/2020, 11:48 AM  Clinical Narrative:     [facility] hold due to covid outbreak. [facility] is reviewing referral. [facility] has made bed offer but this would involve changing dialysis site and pt is requesting [facility].   [facility] cannot take dialsysis.  1345: CSW spoke to pt again about current offers.  Pt does not want to go to [facility], said she had been there before and it did not go well.  Pt initially said she did not want to go to [facility] but now says she would be willing to try that facility again.  Carollee Leitz at [facility].  Pt asked about [facility] in [place]: CSW called and it is assisted living only.     Expected Discharge Plan: Skilled Nursing Facility Barriers to Discharge: SNF Pending bed offer  Expected Discharge Plan and Services Expected Discharge Plan: [place] Choice: [place] Living arrangements for the past 2 months: Apartment Expected Discharge Date: 10/23/20                                     Social Determinants of Health (SDOH) Interventions    Readmission Risk Interventions Readmission Risk Prevention Plan 04/04/2020  Social Work Consult for [place] Planning/Counseling Complete  Medication Review Press photographer) Complete  Some recent data might be hidden

## 2020-10-24 ENCOUNTER — Other Ambulatory Visit: Payer: Self-pay

## 2020-10-24 LAB — RENAL FUNCTION PANEL
Albumin: 3.4 g/dL — ABNORMAL LOW (ref 3.5–5.0)
Anion gap: 12 (ref 5–15)
BUN: 47 mg/dL — ABNORMAL HIGH (ref 6–20)
CO2: 25 mmol/L (ref 22–32)
Calcium: 9.9 mg/dL (ref 8.9–10.3)
Chloride: 100 mmol/L (ref 98–111)
Creatinine, Ser: 8.81 mg/dL — ABNORMAL HIGH (ref 0.44–1.00)
GFR, Estimated: 5 mL/min — ABNORMAL LOW (ref >60)
Glucose, Bld: 87 mg/dL (ref 70–99)
Phosphorus: 4.5 mg/dL (ref 2.5–4.6)
Potassium: 4.6 mmol/L (ref 3.5–5.1)
Sodium: 137 mmol/L (ref 135–145)

## 2020-10-24 LAB — CBC
HCT: 32 % — ABNORMAL LOW (ref 36.0–46.0)
Hemoglobin: 9.8 g/dL — ABNORMAL LOW (ref 12.0–15.0)
MCH: 26.1 pg (ref 26.0–34.0)
MCHC: 30.6 g/dL (ref 30.0–36.0)
MCV: 85.1 fL (ref 80.0–100.0)
Platelets: 115 10*3/uL — ABNORMAL LOW (ref 150–400)
RBC: 3.76 MIL/uL — ABNORMAL LOW (ref 3.87–5.11)
RDW: 20 % — ABNORMAL HIGH (ref 11.5–15.5)
WBC: 5 10*3/uL (ref 4.0–10.5)
nRBC: 0 % (ref 0.0–0.2)

## 2020-10-24 LAB — GLUCOSE, CAPILLARY
Glucose-Capillary: 139 mg/dL — ABNORMAL HIGH (ref 70–99)
Glucose-Capillary: 189 mg/dL — ABNORMAL HIGH (ref 70–99)

## 2020-10-24 LAB — SARS CORONAVIRUS 2 (TAT 6-24 HRS): SARS Coronavirus 2: NEGATIVE

## 2020-10-24 MED ORDER — LIDOCAINE HCL (PF) 1 % IJ SOLN: 5.0000 mL | INTRAMUSCULAR | Status: DC | PRN

## 2020-10-24 MED ORDER — HEPARIN SODIUM (PORCINE) 1000 UNIT/ML DIALYSIS
20.0000 [IU]/kg | INTRAMUSCULAR | Status: DC | PRN
  Filled 2020-10-24: qty 2

## 2020-10-24 MED ORDER — HEPARIN SODIUM (PORCINE) 1000 UNIT/ML DIALYSIS
3000.0000 [IU] | Freq: Once | INTRAMUSCULAR | Status: DC
  Filled 2020-10-24: qty 3

## 2020-10-24 MED ORDER — SODIUM CHLORIDE 0.9 % IV SOLN: 100.0000 mL | INTRAVENOUS | Status: DC | PRN

## 2020-10-24 MED ORDER — DOXERCALCIFEROL 4 MCG/2ML IV SOLN
INTRAVENOUS | Status: AC
  Filled 2020-10-24: qty 2

## 2020-10-24 MED ORDER — DIPHENHYDRAMINE HCL 50 MG/ML IJ SOLN
INTRAMUSCULAR | Status: AC
  Filled 2020-10-24: qty 1

## 2020-10-24 MED ORDER — DIPHENHYDRAMINE HCL 50 MG/ML IJ SOLN
25.0000 mg | Freq: Once | INTRAMUSCULAR | Status: AC
  Administered 2020-10-24: 25 mg via INTRAVENOUS

## 2020-10-24 MED ORDER — CLONAZEPAM 1 MG PO TABS: 1.0000 mg | ORAL_TABLET | Freq: Two times a day (BID) | ORAL | 0 refills | Status: DC | PRN

## 2020-10-24 MED ORDER — PENTAFLUOROPROP-TETRAFLUOROETH EX AERO: 1.0000 "application " | INHALATION_SPRAY | CUTANEOUS | Status: DC | PRN

## 2020-10-24 MED ORDER — MELATONIN 3 MG PO TABS: 3.0000 mg | ORAL_TABLET | Freq: Once | ORAL | Status: DC | PRN

## 2020-10-24 NOTE — Progress Notes (Signed)
Patient has requested several times and been denied each time to have her IV removed.  She recently returned from dialysis (-2L) and her d/c has been delayed d/t transportation.  IV must remain in place in case of emergency.  Patient yelled at Charge RN stating she was going to pull it out herself.

## 2020-10-24 NOTE — TOC Transition Note (Signed)
Transition of Care [facility]) - CM/SW Discharge Note   Patient Details  Name: Karen [name] MRN: [id number] Date of Birth: [birth year]  Transition of Care [facility]) CM/SW Contact:  Joanne Chars, LCSW Phone Number: 10/24/2020, 1:16 PM   Clinical Narrative:  Pt discharging to [facility], Room 215.  RN call report to [phone].  Transportation through [employer].      Final next level of care: Skilled Nursing Facility Barriers to Discharge: Barriers Resolved   Patient Goals and CMS Choice Patient states their goals for this hospitalization and ongoing recovery are:: "get back to walking" CMS Medicare.gov Compare Post Acute Care list provided to:: Patient Choice offered to / list presented to : Patient  Discharge Placement              Patient chooses bed at:  [facility]) Patient to be transferred to facility by: [facility] Name of family member notified: Tresa Moore, pastor Patient and family notified of of transfer: 10/24/20  Discharge Plan and Services     Post Acute Care Choice: [place]                               Social Determinants of Health (SDOH) Interventions     Readmission Risk Interventions Readmission Risk Prevention Plan 04/04/2020  Social Work Consult for [place] Planning/Counseling Complete  Medication Review Press photographer) Complete  Some recent data might be hidden

## 2020-10-24 NOTE — Procedures (Signed)
I was present at this dialysis session. I have reviewed the session itself and made appropriate changes.   Vital signs in last 24 hours:  Temp:  [97.9 F (36.6 C)-98.6 F (37 C)] 98 F (36.7 C) (02/03 0705) Pulse Rate:  [81-91] 91 (02/03 0800) Resp:  [20] 20 (02/03 0705) BP: (153-196)/(81-104) 171/100 (02/03 0800) SpO2:  [97 %-100 %] 100 % (02/03 0705) Weight:  [95 kg] 95 kg (02/03 0705) Weight change:  Filed Weights   10/21/20 1035 10/22/20 0512 10/24/20 0705  Weight: 94.8 kg 95.9 kg 95 kg    Recent Labs  Lab 10/24/20 0653  NA 137  K 4.6  CL 100  CO2 25  GLUCOSE 87  BUN 47*  CREATININE 8.81*  CALCIUM 9.9  PHOS 4.5    Recent Labs  Lab 10/20/20 1038 10/21/20 0818 10/24/20 0653  WBC 4.7 5.6 5.0  NEUTROABS 2.9  --   --   HGB 10.3* 9.8* 9.8*  HCT 35.4* 32.3* 32.0*  MCV 90.5 85.2 85.1  PLT 85* 99* 115*    Scheduled Meds: . amLODipine  10 mg Oral Daily  . atorvastatin  40 mg Oral Daily  . Chlorhexidine Gluconate Cloth  6 each Topical Q0600  . clopidogrel  75 mg Oral Q breakfast  . doxercalciferol  4 mcg Intravenous Q M,W,F-HD  . feeding supplement (NEPRO CARB STEADY)  237 mL Oral BID BM  . insulin aspart  0-6 Units Subcutaneous TID WC  . metoprolol succinate  12.5 mg Oral Daily  . multivitamin  1 tablet Oral QHS  . pantoprazole  40 mg Oral QAC breakfast  . sevelamer carbonate  2,400 mg Oral TID WC  . sodium chloride flush  3 mL Intravenous Q12H   Continuous Infusions: PRN Meds:.acetaminophen **OR** acetaminophen, albuterol, hydrALAZINE, lactulose, lidocaine-prilocaine, loperamide, melatonin, ondansetron **OR** ondansetron (ZOFRAN) IV, polyethylene glycol      Dialysis Orders:Dialysis Orders:[facility] MWF 4 hrs 180NRe 400/80094kg 2.0 K/ 2.0Ca UFP 2 L AVG -Heparin3000 units IV TIW -Hectorol30mcg IV TIW -Mircera123mcg IV q 2 weeks (Last dose01/14/2022) -Venofer 100 mg IV X 5 doses 1/5 doses given  Assessment/Plan: 1. Volume overload: She  appears to have lost body weight, missed and truncated treatments. Lower volume as tolerated. BNP not a reliable marker in ESRD.3 liters removed in HD 10/19/20. Continue lowering volume as tolerated. 2. Acute hypoxic respiratory failure: Believe primary issue is volume overload D/T truncated and missed HD. Also being treated for PNA per primary.  3. Aspiration PNA. WBC 5.0, afebrile. ABX per primary. COVID negative. 4. ESRD - MWF. Continues pattern of missed and truncated treatments. Back on outpatient schedule but now refused to come to dialysis today.  Not sure when we will fit her in given large inpatient census. 5. Hypertension/volume -Volume as noted above. Hypertensive on admission. Resumed amlodipine and metoprolol. BP more stable.Net UF 3 liters with HD 01/29. No post wt. Order daily wts. UF as tolerated tomorrow. 6. Anemia - HGB 10.1 Next dose due01/28/2022.Rec'dAranesp 40 mcg IV with HD 10/19/20 7. Metabolic bone disease - C Ca 9.76 PO4 elevated. Chronic issue with noncompliance with binders. Continue binders/VDRA. 8. Nutrition - Albumin 3.3. Renal/Carb mod diet with nepro, renal vit.Asking for regular diet which she eats at home. K+ OK. Regular diet. 9. DM-per primary 10. Thrombocytopenia- follow platelets. 72. H/O CVA. Speech study today. No treatment recommended. 12. Constipation- improved with enema 13. Social issues: Askingfor SNF placement.  Donetta Potts,  MD 10/24/2020, 8:25 AM

## 2020-10-24 NOTE — TOC Progression Note (Addendum)
Transition of Care [facility]) - Progression Note    Patient Details  Name: Karen [name] MRN: [id number] Date of Birth: March 06, 1976  Transition [facility]) CM/SW Contact  Joanne Chars, LCSW Phone Number: 10/24/2020, 11:08 AM  Clinical Narrative:   CSW received bed offer from [facility].  CSW discussed with pt who wants to accept this offer.  [facility] notified of facility.  Covid was requested yesterday but not done.  CSW spoke with RN who will collect covid test.  SNF will accept pt without updated covid test. 1300: auth received from [facility]: 3 days starting 2/2.  Next review on 2/4 with Bonney Roussel.  Auth ID [id number].  Fax [phone].      Expected Discharge Plan: Skilled Nursing Facility Barriers to Discharge: SNF Pending bed offer  Expected Discharge Plan and Services Expected Discharge Plan: [place] Choice: [place] Living arrangements for the past 2 months: Apartment Expected Discharge Date: 10/23/20                                     Social Determinants of Health (SDOH) Interventions    Readmission Risk Interventions Readmission Risk Prevention Plan 04/04/2020  Social Work Consult for [place] Planning/Counseling Complete  Medication Review Press photographer) Complete  Some recent data might be hidden

## 2020-10-24 NOTE — Discharge Summary (Signed)
Physician Discharge Summary  Karen [name] WJX:[id number] DOB: [birth year] DOA: 10/17/2020  PCP: Patient, No Pcp Per  Seen and examined and agree with plan of care according to my partner who saw this patient EF.  She is stable for discharge to skilled facility  Admit date: 10/17/2020 Discharge date: 10/24/2020  Admitted From: Home Discharge disposition: SNF  30-day re-admission risk score:  30 Day Unplanned Readmission Risk Score   Flowsheet Row ED to Hosp-Admission (Current) from 10/17/2020 in [facility]  30 Day Unplanned Readmission Risk Score (%) 30.71 Filed at 10/23/2020 0801     This score is the patient's risk of an unplanned readmission within 30 days of being discharged (0 -100%). The score is based on dignosis, age, lab data, medications, orders, and past utilization.   Low:  0-14.9   Medium: 15-21.9   High: 22-29.9   Extreme: 30 and above         Recommendations for Outpatient Follow-Up:   1. Being discharged to SNF for rehab. 2. F/U at HD per usual schedule. 3. Monitor bowel function closely.   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, with long-term current use of insulin (HCC)   End-stage renal disease on hemodialysis (HCC)   Anemia of chronic disease   Hyperkalemia   Hypertension with fluid overload   Obesity   Fluid overload   Dyspnea   Late effect of cerebrovascular accident (CVA)   Thrombocytopenia (HCC)   Dysphasia status post cerebrovascular accident   Nausea and vomiting   Diarrhea   HLD (hyperlipidemia)   Secondary hyperparathyroidism of renal origin ([place])   Type 2 diabetes mellitus with hyperglycemia ([place])   Hypertensive urgency   Aspiration pneumonia  ([place])    Discharge Condition: Improved.  Diet recommendation: Low sodium, heart healthy.  Carbohydrate-modified.  Regular.  Wound care: None.  Code status: Full.   History of Present Illness:   45 year old female with past medical history of ESRD on HD (MWF), hypertension, CVA with residual dysarthria, cardiomyopathy, type 2 diabetes with peripheral neuropathy, s/p bilateral BKA who presented to the ED on 10/17/20 with 2 weeks of nonbloody diarrhea with associated midline abdominal pain also noted that she had a cough during this period of time without any significant fevers.  Reported she feels like food goes down the wrong way when eating.  Acute abdominal series was significant for vascular congestion with mild interstitial edema and a normal gas pattern.  C. difficile and GI panel were negative.  CT abdomen pelvis with contrast significant for right posterior lung base pneumonia and interstitial edema in the lungs.  She was started on Rocephin and azithromycin for aspiration pneumonia.  Nephrology was consulted for ESRD and maintained her usual dialysis schedule.   Hospital Course by Problem:   Principle Problems Acute hypoxic respiratory failure likely secondary to volume overload and aspiration pneumonia, POA Dx of acute respiratory failure validated by the presence of cough, SOB and Sp02 of 82% documented on admission which improved with supplemental oxygen.  Imaging showed interstitial edema in lungs and right posterior lung base pneumonia, contributory to her hypoxic respiratory failure.  Both underlying issues addressed as outlined below, with improvement in her oxygen status.   Aspiration pneumonia in  the setting of dysphagia from prior CVA, POA, resolved Status post SLP eval consistent with aspiration on assessment and barium swallow. Completed 4 days of antibiotics with resolution of symptoms.  Volume overload in the setting of ESRD on HD (MWF)/hyperphosphatemia, POA Missed  HD on 10/16/2020, possibly the inciting event? Has a pattern of missed and truncated treatments. Continue treatment with binders, treatment of secondary hyperparathyroidism. F/U HD center post discharge.  Active problem: Hyper/hypokalemia Resolved with HD.  Hypertension, POA, chronic Continue amlodipine and metoprolol can encourage compliance.  Anemia of chronic disease, POA, chronic Given Aranesp per nephro with HD 10/19/20.  Type 2 diabetes with long term use of insulin, with renal complications, chronic Resume lantus at discharge.  Nonbloody diarrhea of unknown etiology, resolved Possibly from viral syndrome?  PAD/HLD Continue statin, Plavix, aspirin  Acute on chronic thrombocytopenia Platelet count stable at discharge.    Medical Consultants:    Nephrology   Discharge Exam:   Vitals:   10/24/20 0953 10/24/20 1043  BP: (!) 153/81 (!) 153/81  Pulse: 90 90  Resp:  18  Temp: 98.3 F (36.8 C) 98.1 F (36.7 C)  SpO2:  100%   Vitals:   10/24/20 0930 10/24/20 0945 10/24/20 0953 10/24/20 1043  BP: (!) 160/89 (!) 133/97 (!) 153/81 (!) 153/81  Pulse: 72 93 90 90  Resp:  18  18  Temp:   98.3 F (36.8 C) 98.1 F (36.7 C)  TempSrc:   Axillary   SpO2:  97%  100%  Weight:        General exam: Anxious appearing, upset that her bowels haven't moved. Respiratory system: Clear to auscultation. Respiratory effort normal. Cardiovascular system: S1 & S2 heard, RRR. No JVD,  rubs, gallops or clicks. No murmurs. Gastrointestinal system: Abdomen is nondistended, soft and nontender. No organomegaly or masses felt. Normal bowel sounds heard. Central nervous system: Alert, non-focal Extremities: Bilateral BKA's. Skin: No rashes, lesions or ulcers. Psychiatry: Judgement and insight appear impaired. Mood & affect anxious.   The results of significant diagnostics from this hospitalization (including imaging, microbiology, ancillary and laboratory) are listed below for  reference.     Procedures and Diagnostic Studies:   CT ABDOMEN PELVIS W CONTRAST  Result Date: 10/17/2020 CLINICAL DATA:  Diarrhea and vomiting.  Chronic renal failure EXAM: CT ABDOMEN AND PELVIS WITH CONTRAST TECHNIQUE: Multidetector CT imaging of the abdomen and pelvis was performed using the standard protocol following bolus administration of intravenous contrast. CONTRAST:  127m OMNIPAQUE IOHEXOL 300 MG/ML  SOLN COMPARISON:  CT abdomen and pelvis July 19, 2020 FINDINGS: Lower chest: There is airspace opacity in the posterior right base consistent with pneumonia. There is mild bibasilar interstitial pulmonary edema. There is cardiomegaly with multiple foci of coronary artery calcification. Hepatobiliary: Liver measures 23.0 cm in length. There is decreased attenuation in the liver, likely due to hepatic steatosis. Liver contour is subtly nodular. No focal liver lesions are evident. Gallbladder is contracted. There is no appreciable biliary duct dilatation. Pancreas: There is no pancreatic mass or inflammatory focus. Spleen: No splenic lesions are evident. Adrenals/Urinary Tract: Adrenals bilaterally appear normal. No evident renal mass. No hydronephrosis on either side. There is extensive peripheral renal artery calcification. No evident renal or ureteral calculus. Urinary bladder is midline with wall thickness borderline increased. Stomach/Bowel: There is no appreciable bowel wall or mesenteric thickening. No evident bowel obstruction. The terminal ileum appears normal. There is no free air or portal venous air. Vascular/Lymphatic: No abdominal aortic aneurysm. There is aortic and  pelvic arterial vascular calcification. Calcification is also noted in peripheral renal arteries as well as in portions of the hepatic artery. No adenopathy is appreciable in the abdomen or pelvis. Reproductive: Uterus is anteverted. Periuterine arterial vascular calcification noted bilaterally. No adnexal masses are  evident. Other: Appendix appears normal. There is no evident abscess or ascites in the abdomen pelvis. There is mild edema in the abdominal wall, a likely degree of mild anasarca. There is mild fat in the umbilicus. Musculoskeletal: No blastic or lytic bone lesions. Foci of degenerative change noted in the lumbar spine. No intramuscular lesions are evident. IMPRESSION: 1. Apparent focus of pneumonia in the posterior right base. There is apparent degree of interstitial edema in the lung bases. 2. Enlarged liver with subtle nodular contour of the liver suggesting underlying cirrhosis. No focal liver lesions are appreciable. 3. No bowel obstruction. No abscess in the abdomen or pelvis. Appendix appears normal. 4. Aortic Atherosclerosis (ICD10-I70.0). Extensive arterial and pelvic vascular calcification throughout the abdomen and pelvis. Foci of coronary artery calcification noted. 5.  Mild anasarca in the abdominal wall. Electronically Signed   By: Lowella Grip III M.D.   On: 10/17/2020 12:01   DG Abdomen Acute W/Chest  Result Date: 10/17/2020 CLINICAL DATA:  Abdominal pain, diarrhea.  Dialysis patient EXAM: DG ABDOMEN ACUTE WITH 1 VIEW CHEST COMPARISON:  Chest 10/02/2020 FINDINGS: Cardiac enlargement. Vascular congestion with mild interstitial edema. Small right effusion. Normal bowel gas pattern. No obstruction or free air. No renal calculi. Diffuse arterial calcification. IMPRESSION: Vascular congestion with mild interstitial edema and small right effusion Normal bowel gas pattern. Electronically Signed   By: Franchot Gallo M.D.   On: 10/17/2020 08:06   DG Swallowing Func-Speech Pathology  Result Date: 10/18/2020 Objective Swallowing Evaluation: Type of Study: MBS-Modified Barium Swallow Study  Patient Details Name: Karen [name] MRN: [id number] Date of Birth: 09/22/1975 Today's Date: 10/18/2020 Time: SLP Start Time (ACUTE ONLY): 1045 -SLP Stop Time (ACUTE ONLY): 1100 SLP Time Calculation (min) (ACUTE  ONLY): 15 min Past Medical History: Past Medical History: Diagnosis Date . Anemia  . Anxiety  . Eczema  . ESRD (end stage renal disease) ([place])   [place] - [facility]- [place] . Exertional shortness of breath  . GERD (gastroesophageal reflux disease)  . Headache  . History of blood transfusion  . History of kidney stones   passed . Hypertension  . Ischemic cardiomyopathy   by echo 2014 . Neuropathy  . Pneumonia 09/2016 . Type II diabetes mellitus ([place]) 1995 Past Surgical History: Past Surgical History: Procedure Laterality Date . ABDOMINAL AORTOGRAM W/LOWER EXTREMITY Right 04/03/2020  Procedure: ABDOMINAL AORTOGRAM W/LOWER EXTREMITY;  Surgeon: Marty Heck, MD;  Location: [facility];  Service: Cardiovascular;  Laterality: Right; . AMPUTATION Left 02/06/2013  Procedure: AMPUTATION LEFT GREAT TOE;  Surgeon: Wylene Simmer, MD;  Location: [place];  Service: Orthopedics;  Laterality: Left; . AMPUTATION Left 06/24/2013  Procedure: AMPUTATION BELOW KNEE ;  Surgeon: Wylene Simmer, MD;  Location: [place] [facility];  Service: Orthopedics;  Laterality: Left; . AMPUTATION Right 04/10/2020  Procedure: RIGHT AMPUTATION BELOW KNEE;  Surgeon: Waynetta Sandy, MD;  Location: [place];  Service: Vascular;  Laterality: Right; . AV FISTULA PLACEMENT Right 02/08/2017  Procedure: CREATION OF RIGHT ARM  BASILIC VEIN TO BRACHIAL ARTERY ARTERIOVENOUS (AV) FISTULA;  Surgeon: Rosetta Posner, MD;  Location: [place];  Service: Vascular;  Laterality: Right; . AV FISTULA PLACEMENT Left 09/10/2017  Procedure: INSERTION OF ARTERIOVENOUS (AV) GORE-TEX GRAFT  LEFT UPPER  ARM;  Surgeon: Angelia Mould, MD;  Location[facility];  Service: Vascular;  Laterality: Left; . BASCILIC VEIN TRANSPOSITION Right 04/14/2017  Procedure: BASCILIC VEIN TRANSPOSITION-RIGHT 2ND STAGE;  Surgeon: Rosetta Posner, MD;  Location: [facility];  Service: Vascular;  Laterality: Right; . CESAREAN SECTION  10/18/2006 . INSERTION OF DIALYSIS CATHETER   . PERIPHERAL VASCULAR BALLOON ANGIOPLASTY  Right 04/03/2020  Procedure: PERIPHERAL VASCULAR BALLOON ANGIOPLASTY;  Surgeon: Marty Heck, MD;  Location: [facility];  Service: Cardiovascular;  Laterality: Right;  SFA . WOUND DEBRIDEMENT Right 04/04/2020  Procedure: RIGHT FOOT DEBRIDEMENT;  Surgeon: Waynetta Sandy, MD;  Location: [place];  Service: Vascular;  Laterality: Right; HPI: 45 y.o. female,  with a past medical history significant for ESRD on dialysis (T, Th, S), HTN, stroke,T2DM, PAD s/p left BKA, ischemic cardiomyopathy presenting with diarrhea. CT scan of the abdomen and pelvis with contrast significant for focus of pneumonia in the right posterior lung base and degree of interstitial edema in the lungs. Pt reports concern about aspiration. Pt followed by SLP services during Dec 2018 admission for CVA (infarct left pons, right parietal lobe, right corpus callosum/cingulate gyrus) - was found to have a mild dysphagia/dysarthria.  She reports increased episodes of choking with meals PTA. Last MBS completed on 01/28/2018 showing sensed peentration of thin liquids. Pt has ongoing spastic dysarthria and hopes for improvement, she reports she had an SLP working with her at home as recently as 6 months ago.  No data recorded Assessment / Plan / Recommendation CHL IP CLINICAL IMPRESSIONS 10/18/2020 Clinical Impression Pt demonstrates variable ability to time and execute oral and pharyngeal movements during swallowing, at worst leading to trace frank aspiration before the swallow. Oral phase is characterized by slow oral manipulation of bolus, with increased time for posterior propulsion if pt is given a motor command to execute.  Primarily problem is premature spillage causing trace silent penetration. A verbal cue to orally hold the bolus prior to starting the swallow slows down the process, but decreases instance of penetration. Boluses may reach the pharynx with timely laryngeal protection, or occasionally spill and pool in the  pyriforms prior to swallow initiation. Overall, pts function is stable, we discussed compensatory strategies, pt to continue current diet. No acute SLP f/u needed, pt would benefit from f/u with [facility] SLP.  SLP Visit Diagnosis Dysphagia, oropharyngeal phase (R13.12) Attention and concentration deficit following -- Frontal lobe and executive function deficit following -- Impact on safety and function Mild aspiration risk   CHL IP TREATMENT RECOMMENDATION 10/18/2020 Treatment Recommendations No treatment recommended at this time   Prognosis 09/11/2017 Prognosis for Safe Diet Advancement Good Barriers to Reach Goals Cognitive deficits Barriers/Prognosis Comment -- CHL IP DIET RECOMMENDATION 10/18/2020 SLP Diet Recommendations Regular solids;Thin liquid Liquid Administration via Cup;Straw Medication Administration -- Compensations -- Postural Changes --   CHL IP OTHER RECOMMENDATIONS 10/18/2020 Recommended Consults -- Oral Care Recommendations Oral care BID Other Recommendations --   CHL IP FOLLOW UP RECOMMENDATIONS 10/18/2020 Follow up Recommendations 24 hour supervision/assistance;Home health SLP   CHL IP FREQUENCY AND DURATION 04/11/2020 Speech Therapy Frequency (ACUTE ONLY) min 2x/week Treatment Duration --      CHL IP ORAL PHASE 10/18/2020 Oral Phase Impaired Oral - Pudding Teaspoon -- Oral - Pudding Cup -- Oral - Honey Teaspoon -- Oral - Honey Cup -- Oral - Nectar Teaspoon -- Oral - Nectar Cup -- Oral - Nectar Straw -- Oral - Thin Teaspoon -- Oral - Thin Cup Premature  spillage;Delayed oral transit;Holding of bolus Oral - Thin Straw Premature spillage;Delayed oral transit;Holding of bolus Oral - Puree Delayed oral transit;Holding of bolus Oral - Mech Soft -- Oral - Regular Delayed oral transit;Holding of bolus Oral - Multi-Consistency -- Oral - Pill Premature spillage;Delayed oral transit;Holding of bolus Oral Phase - Comment --  CHL IP PHARYNGEAL PHASE 10/18/2020 Pharyngeal Phase Impaired Pharyngeal- Pudding Teaspoon --  Pharyngeal -- Pharyngeal- Pudding Cup -- Pharyngeal -- Pharyngeal- Honey Teaspoon -- Pharyngeal -- Pharyngeal- Honey Cup -- Pharyngeal -- Pharyngeal- Nectar Teaspoon -- Pharyngeal -- Pharyngeal- Nectar Cup -- Pharyngeal -- Pharyngeal- Nectar Straw -- Pharyngeal -- Pharyngeal- Thin Teaspoon -- Pharyngeal -- Pharyngeal- Thin Cup Delayed swallow initiation-vallecula;Delayed swallow initiation-pyriform sinuses;Penetration/Aspiration before swallow Pharyngeal Material enters airway, CONTACTS cords and not ejected out;Material does not enter airway Pharyngeal- Thin Straw Delayed swallow initiation-pyriform sinuses;Delayed swallow initiation-vallecula;Penetration/Aspiration before swallow Pharyngeal Material enters airway, CONTACTS cords and not ejected out;Material does not enter airway Pharyngeal- Puree Delayed swallow initiation-vallecula;Delayed swallow initiation-pyriform sinuses Pharyngeal -- Pharyngeal- Mechanical Soft Delayed swallow initiation-vallecula;Delayed swallow initiation-pyriform sinuses Pharyngeal -- Pharyngeal- Regular -- Pharyngeal -- Pharyngeal- Multi-consistency -- Pharyngeal -- Pharyngeal- Pill -- Pharyngeal -- Pharyngeal Comment --  CHL IP CERVICAL ESOPHAGEAL PHASE 01/28/2018 Cervical Esophageal Phase WFL Pudding Teaspoon -- Pudding Cup -- Honey Teaspoon -- Honey Cup -- Nectar Teaspoon -- Nectar Cup -- Nectar Straw -- Thin Teaspoon -- Thin Cup -- Thin Straw -- Puree -- Mechanical Soft -- Regular -- Multi-consistency -- Pill -- Cervical Esophageal Comment -- Herbie Baltimore, MA CCC-SLP Acute Rehabilitation Services Pager [phone] Office [phone] Lynann Beaver 10/18/2020, 12:06 PM                Labs:   Basic Metabolic Panel: Recent Labs  Lab 10/18/20 0540 10/19/20 1015 10/20/20 1038 10/20/20 1720 10/21/20 0818 10/24/20 0653  NA 138 137 136 135 135 137  K 3.9 3.4* 4.3 5.4* 4.1 4.6  CL 97* 97* 100 100 98 100  CO2 27 24 21* 20* 24 25  GLUCOSE 102* 106* 162* 111* 137*  87  BUN 30* 26* 28* 31* 38* 47*  CREATININE 10.19* 8.76* 7.49* 7.81* 8.48* 8.81*  CALCIUM 9.2 8.7* 8.6* 8.8* 9.2 9.9  PHOS 6.5*  --   --  4.7* 4.8* 4.5   GFR CrCl cannot be calculated (Unknown ideal weight.). Liver Function Tests: Recent Labs  Lab 10/18/20 0540 10/20/20 1720 10/21/20 0818 10/24/20 0653  ALBUMIN 3.3* 3.2* 3.1* 3.4*   No results for input(s): LIPASE, AMYLASE in the last 168 hours. No results for input(s): AMMONIA in the last 168 hours. Coagulation profile No results for input(s): INR, PROTIME in the last 168 hours.  CBC: Recent Labs  Lab 10/18/20 0540 10/19/20 1015 10/20/20 1038 10/21/20 0818 10/24/20 0653  WBC 5.0 4.6 4.7 5.6 5.0  NEUTROABS  --   --  2.9  --   --   HGB 10.1* 9.7* 10.3* 9.8* 9.8*  HCT 34.2* 32.3* 35.4* 32.3* 32.0*  MCV 86.6 85.0 90.5 85.2 85.1  PLT 94* 85* 85* 99* 115*   CBG: Recent Labs  Lab 10/23/20 0848 10/23/20 1212 10/23/20 1713 10/23/20 2026 10/24/20 1134  GLUCAP 116* 89 84 155* 139*   Microbiology Recent Results (from the past 240 hour(s))  Gastrointestinal Panel by PCR , Stool     Status: None   Collection Time: 10/17/20  8:53 AM   Specimen: Stool  Result Value Ref Range Status   Campylobacter species NOT DETECTED NOT DETECTED Final   Plesimonas shigelloides NOT  DETECTED NOT DETECTED Final   Salmonella species NOT DETECTED NOT DETECTED Final   Yersinia enterocolitica NOT DETECTED NOT DETECTED Final   Vibrio species NOT DETECTED NOT DETECTED Final   Vibrio cholerae NOT DETECTED NOT DETECTED Final   Enteroaggregative E coli (EAEC) NOT DETECTED NOT DETECTED Final   Enteropathogenic E coli (EPEC) NOT DETECTED NOT DETECTED Final   Enterotoxigenic E coli (ETEC) NOT DETECTED NOT DETECTED Final   Shiga like toxin producing E coli (STEC) NOT DETECTED NOT DETECTED Final   Shigella/Enteroinvasive E coli (EIEC) NOT DETECTED NOT DETECTED Final   Cryptosporidium NOT DETECTED NOT DETECTED Final   Cyclospora cayetanensis NOT  DETECTED NOT DETECTED Final   Entamoeba histolytica NOT DETECTED NOT DETECTED Final   Giardia lamblia NOT DETECTED NOT DETECTED Final   Adenovirus F40/41 NOT DETECTED NOT DETECTED Final   Astrovirus NOT DETECTED NOT DETECTED Final   Norovirus GI/GII NOT DETECTED NOT DETECTED Final   Rotavirus A NOT DETECTED NOT DETECTED Final   Sapovirus (I, II, IV, and V) NOT DETECTED NOT DETECTED Final    Comment: Performed at [facility], [address] [place], [place] [address]  C Difficile Quick Screen w PCR reflex     Status: None   Collection Time: 10/17/20  8:54 AM   Specimen: Stool  Result Value Ref Range Status   C Diff antigen NEGATIVE NEGATIVE Final   C Diff toxin NEGATIVE NEGATIVE Final   C Diff interpretation No C. difficile detected.  Final    Comment: Performed at [facility], [place] [address], [place], [place] [address]  SARS Coronavirus 2 by RT PCR (hospital order, performed in [facility] hospital lab) Nasopharyngeal Nasopharyngeal Swab     Status: None   Collection Time: 10/17/20  4:16 PM   Specimen: Nasopharyngeal Swab  Result Value Ref Range Status   SARS Coronavirus 2 NEGATIVE NEGATIVE Final    Comment: (NOTE) SARS-CoV-2 target nucleic acids are NOT DETECTED.  The SARS-CoV-2 RNA is generally detectable in upper and lower respiratory specimens during the acute phase of infection. The lowest concentration of SARS-CoV-2 viral copies this assay can detect is 250 copies / mL. A negative result does not preclude SARS-CoV-2 infection and should not be used as the sole basis for treatment or other patient management decisions.  A negative result may occur with improper specimen collection / handling, submission of specimen other than nasopharyngeal swab, presence of viral mutation(s) within the areas targeted by this assay, and inadequate number of viral copies (<250 copies / mL). A negative result must be combined with clinical observations, patient history, and  epidemiological information.  Fact Sheet for Patients:   StrictlyIdeas.no  Fact Sheet for Healthcare Providers: BankingDealers.co.za  This test is not yet approved or  cleared by the [place] FDA and has been authorized for detection and/or diagnosis of SARS-CoV-2 by FDA under an Emergency Use Authorization (EUA).  This EUA will remain in effect (meaning this test can be used) for the duration of the COVID-19 declaration under Section 564(b)(1) of the Act, 21 U.S.C. section 360bbb-3(b)(1), unless the authorization is terminated or revoked sooner.  Performed at [facility], [place] [address] [place], [place] [address]      Discharge Instructions:   Discharge Instructions    Call MD for:  extreme fatigue   Complete by: As directed    Call MD for:  persistant nausea and vomiting   Complete by: As directed    Call MD for:  severe uncontrolled pain   Complete by: As directed    Call MD for:  temperature >100.4   Complete by: As directed    Diet - low sodium heart healthy   Complete by: As directed    Diet Carb Modified   Complete by: As directed    Increase activity slowly   Complete by: As directed      Allergies as of 10/24/2020      Reactions   Baclofen Itching   Penicillins Anaphylaxis, Hives, Rash, Other (See Comments)   PATIENT HAD A PCN REACTION WITH IMMEDIATE RASH, FACIAL/TONGUE/THROAT SWELLING, SOB, OR LIGHTHEADEDNESS WITH HYPOTENSION:  #  #  #  YES  #  #  #   SEVERE RASH INVOLVING MUCUS MEMBRANES or SKIN NECROSIS: #  #  #  YES  #  #  # Has patient had a PCN reaction that required hospitalization No Has patient had a PCN reaction occurring within the last 10 years: No If all of the above answers are "NO", then may proceed with Cephalosporin use. 09/10/16- tolerated Cefepime   Morphine And Related Hives, Rash   Novolog [insulin Aspart] Other (See Comments)   Cramps/ Gi distress      Medication List     STOP taking these medications   Amitiza 24 MCG capsule Generic drug: lubiprostone   furosemide 20 MG tablet Commonly known as: LASIX   oxyCODONE 5 MG immediate release tablet Commonly known as: Oxy IR/ROXICODONE     TAKE these medications   amLODipine 10 MG tablet Commonly known as: NORVASC Take 10 mg by mouth daily.   aspirin EC 81 MG tablet Take 81 mg by mouth daily. Swallow whole.   atorvastatin 40 MG tablet Commonly known as: LIPITOR Take 40 mg by mouth daily.   calcitRIOL 0.5 MCG capsule Commonly known as: ROCALTROL Take 3 capsules (1.5 mcg total) by mouth every Monday, Wednesday, and Friday.   calcium carbonate 1250 (500 Ca) MG chewable tablet Commonly known as: OS-CAL Chew 1 tablet by mouth 3 (three) times daily.   cinacalcet 30 MG tablet Commonly known as: SENSIPAR Take 1 tablet (30 mg total) by mouth every Monday, Wednesday, and Friday with hemodialysis.   clonazePAM 1 MG tablet Commonly known as: KLONOPIN Take 1 mg by mouth 2 (two) times daily as needed for anxiety.   clopidogrel 75 MG tablet Commonly known as: PLAVIX Take 1 tablet (75 mg total) by mouth daily with breakfast.   Darbepoetin Alfa 200 MCG/0.4ML Sosy injection Commonly known as: ARANESP Inject 0.4 mLs (200 mcg total) into the vein every Thursday with hemodialysis.   hydrALAZINE 25 MG tablet Commonly known as: APRESOLINE Take 25 mg by mouth daily.   insulin glargine 100 UNIT/ML injection Commonly known as: LANTUS Inject 9 Units into the skin at bedtime.   lactulose 10 GM/15ML solution Commonly known as: CHRONULAC Take 45 mLs (30 g total) by mouth 2 (two) times daily as needed for mild constipation.   lidocaine-prilocaine cream Commonly known as: EMLA Apply 1 application topically daily as needed (pain).   metoprolol succinate 25 MG 24 hr tablet Commonly known as: TOPROL-XL Take 0.5 tablets (12.5 mg total) by mouth daily.   multivitamin Tabs tablet Take 1 tablet by mouth at  bedtime.   Narcan 4 MG/0.1ML Liqd nasal spray kit Generic drug: naloxone Place 0.4 mg into the nose once. Repeat after 3 minutes if no or minimal response   NovoFine 32G X 6 MM Misc Generic drug: Insulin Pen Needle  ondansetron 4 MG disintegrating tablet Commonly known as: ZOFRAN-ODT Take 1 tablet (4 mg total) by mouth every 8 (eight) hours as needed for nausea or vomiting.   pantoprazole 20 MG tablet Commonly known as: PROTONIX Take 1 tablet (20 mg total) by mouth daily before breakfast. What changed: See the new instructions.       Contact information for follow-up providers    [facility].   Specialty: Gastroenterology Contact information: [place] [id number] [phone]           Contact information for after-discharge care    Destination    [facility] .   Service: Skilled Nursing Contact information: [address] [place] [address] [phone]                   Time coordinating discharge: 40 minutes.  Signed:  Nita Sells, MD   [facility] 10/24/2020, 12:44 PM

## 2020-10-24 NOTE — Progress Notes (Signed)
Patient  Removed own PIV in left arm. No signs of complications noted on the patients arm at this time.

## 2020-10-24 NOTE — Progress Notes (Signed)
Sent chat message to Dr. Verlon Au: Will you please put in an order for a Covid test.  She has a bed waiting for her at an outside facility which is pending a negative covid test.  Thank you!

## 2020-10-24 NOTE — Progress Notes (Signed)
Report called to receiving facility, patient discharged via PTAR.

## 2020-10-24 NOTE — Plan of Care (Signed)
Patient verbalized an understanding on all teaching related to this hospitalization.  She is ready for discharge.

## 2020-12-31 ENCOUNTER — Ambulatory Visit: Payer: Medicare Other | Admitting: Physician Assistant

## 2020-12-31 ENCOUNTER — Ambulatory Visit: Payer: Medicare Other | Admitting: Gastroenterology

## 2021-01-07 ENCOUNTER — Ambulatory Visit: Payer: Medicare Other | Admitting: Physician Assistant

## 2021-07-17 ENCOUNTER — Ambulatory Visit: Payer: Medicaid Other

## 2023-06-21 DIAGNOSIS — R000 Tachycardia, unspecified: Secondary | ICD-10-CM | POA: Diagnosis not present

## 2023-06-26 ENCOUNTER — Emergency Department (HOSPITAL_COMMUNITY): Payer: Medicare HMO

## 2023-06-26 ENCOUNTER — Emergency Department (HOSPITAL_COMMUNITY): Admit: 2023-06-26 | Payer: Medicare HMO | Admitting: Cardiology

## 2023-06-26 ENCOUNTER — Emergency Department (HOSPITAL_COMMUNITY)
Admission: EM | Admit: 2023-06-26 | Discharge: 2023-07-23 | Disposition: E | Payer: Medicare HMO | Attending: Emergency Medicine | Admitting: Emergency Medicine

## 2023-06-26 ENCOUNTER — Encounter (HOSPITAL_COMMUNITY): Payer: Self-pay

## 2023-06-26 DIAGNOSIS — R000 Tachycardia, unspecified: Secondary | ICD-10-CM | POA: Insufficient documentation

## 2023-06-26 DIAGNOSIS — Z992 Dependence on renal dialysis: Secondary | ICD-10-CM | POA: Diagnosis not present

## 2023-06-26 DIAGNOSIS — I214 Non-ST elevation (NSTEMI) myocardial infarction: Secondary | ICD-10-CM | POA: Insufficient documentation

## 2023-06-26 DIAGNOSIS — Z794 Long term (current) use of insulin: Secondary | ICD-10-CM | POA: Insufficient documentation

## 2023-06-26 DIAGNOSIS — Z7982 Long term (current) use of aspirin: Secondary | ICD-10-CM | POA: Diagnosis not present

## 2023-06-26 DIAGNOSIS — I213 ST elevation (STEMI) myocardial infarction of unspecified site: Secondary | ICD-10-CM

## 2023-06-26 DIAGNOSIS — I469 Cardiac arrest, cause unspecified: Secondary | ICD-10-CM

## 2023-06-26 DIAGNOSIS — Z79899 Other long term (current) drug therapy: Secondary | ICD-10-CM | POA: Insufficient documentation

## 2023-06-26 DIAGNOSIS — R791 Abnormal coagulation profile: Secondary | ICD-10-CM | POA: Insufficient documentation

## 2023-06-26 LAB — COMPREHENSIVE METABOLIC PANEL
ALT: 24 U/L (ref 0–44)
AST: 41 U/L (ref 15–41)
Albumin: 3.7 g/dL (ref 3.5–5.0)
Alkaline Phosphatase: 96 U/L (ref 38–126)
Anion gap: 32 — ABNORMAL HIGH (ref 5–15)
BUN: 41 mg/dL — ABNORMAL HIGH (ref 6–20)
CO2: 11 mmol/L — ABNORMAL LOW (ref 22–32)
Calcium: 10.1 mg/dL (ref 8.9–10.3)
Chloride: 91 mmol/L — ABNORMAL LOW (ref 98–111)
Creatinine, Ser: 9.59 mg/dL — ABNORMAL HIGH (ref 0.44–1.00)
GFR, Estimated: 5 mL/min — ABNORMAL LOW (ref >60)
Glucose, Bld: 140 mg/dL — ABNORMAL HIGH (ref 70–99)
Potassium: 5 mmol/L (ref 3.5–5.1)
Sodium: 134 mmol/L — ABNORMAL LOW (ref 135–145)
Total Bilirubin: 1 mg/dL (ref 0.3–1.2)
Total Protein: 9.9 g/dL — ABNORMAL HIGH (ref 6.5–8.1)

## 2023-06-26 LAB — I-STAT VENOUS BLOOD GAS, ED
Acid-base deficit: 10 mmol/L — ABNORMAL HIGH (ref 0.0–2.0)
Bicarbonate: 14.4 mmol/L — ABNORMAL LOW (ref 20.0–28.0)
Calcium, Ion: 1 mmol/L — ABNORMAL LOW (ref 1.15–1.40)
HCT: 44 % (ref 36.0–46.0)
Hemoglobin: 15 g/dL (ref 12.0–15.0)
O2 Saturation: 44 %
Potassium: 4.9 mmol/L (ref 3.5–5.1)
Sodium: 131 mmol/L — ABNORMAL LOW (ref 135–145)
TCO2: 15 mmol/L — ABNORMAL LOW (ref 22–32)
pCO2, Ven: 28.9 mm[Hg] — ABNORMAL LOW (ref 44–60)
pH, Ven: 7.307 (ref 7.25–7.43)
pO2, Ven: 26 mm[Hg] — CL (ref 32–45)

## 2023-06-26 LAB — CBC WITH DIFFERENTIAL/PLATELET
Abs Immature Granulocytes: 0.77 10*3/uL — ABNORMAL HIGH (ref 0.00–0.07)
Basophils Absolute: 0.1 10*3/uL (ref 0.0–0.1)
Basophils Relative: 0 %
Eosinophils Absolute: 0 10*3/uL (ref 0.0–0.5)
Eosinophils Relative: 0 %
HCT: 42.5 % (ref 36.0–46.0)
Hemoglobin: 12.8 g/dL (ref 12.0–15.0)
Immature Granulocytes: 3 %
Lymphocytes Relative: 9 %
Lymphs Abs: 2.1 10*3/uL (ref 0.7–4.0)
MCH: 28.6 pg (ref 26.0–34.0)
MCHC: 30.1 g/dL (ref 30.0–36.0)
MCV: 95.1 fL (ref 80.0–100.0)
Monocytes Absolute: 1.8 10*3/uL — ABNORMAL HIGH (ref 0.1–1.0)
Monocytes Relative: 7 %
Neutro Abs: 19.7 10*3/uL — ABNORMAL HIGH (ref 1.7–7.7)
Neutrophils Relative %: 81 %
Platelets: 162 10*3/uL (ref 150–400)
RBC: 4.47 MIL/uL (ref 3.87–5.11)
RDW: 17.2 % — ABNORMAL HIGH (ref 11.5–15.5)
WBC: 24.4 10*3/uL — ABNORMAL HIGH (ref 4.0–10.5)
nRBC: 0.2 % (ref 0.0–0.2)

## 2023-06-26 LAB — I-STAT CHEM 8, ED
BUN: 47 mg/dL — ABNORMAL HIGH (ref 6–20)
Calcium, Ion: 0.98 mmol/L — ABNORMAL LOW (ref 1.15–1.40)
Chloride: 100 mmol/L (ref 98–111)
Creatinine, Ser: 10.6 mg/dL — ABNORMAL HIGH (ref 0.44–1.00)
Glucose, Bld: 139 mg/dL — ABNORMAL HIGH (ref 70–99)
HCT: 45 % (ref 36.0–46.0)
Hemoglobin: 15.3 g/dL — ABNORMAL HIGH (ref 12.0–15.0)
Potassium: 5 mmol/L (ref 3.5–5.1)
Sodium: 132 mmol/L — ABNORMAL LOW (ref 135–145)
TCO2: 15 mmol/L — ABNORMAL LOW (ref 22–32)

## 2023-06-26 LAB — LIPID PANEL
Cholesterol: 97 mg/dL (ref 0–200)
HDL: 27 mg/dL — ABNORMAL LOW (ref >40)
LDL Cholesterol: 34 mg/dL (ref 0–99)
Total CHOL/HDL Ratio: 3.6 {ratio}
Triglycerides: 180 mg/dL — ABNORMAL HIGH (ref <150)
VLDL: 36 mg/dL (ref 0–40)

## 2023-06-26 LAB — APTT: aPTT: 25 s (ref 24–36)

## 2023-06-26 LAB — I-STAT CG4 LACTIC ACID, ED: Lactic Acid, Venous: 10.4 mmol/L (ref 0.5–1.9)

## 2023-06-26 LAB — TROPONIN I (HIGH SENSITIVITY): Troponin I (High Sensitivity): 218 ng/L (ref <18)

## 2023-06-26 LAB — PROTIME-INR
INR: 1.3 — ABNORMAL HIGH (ref 0.8–1.2)
Prothrombin Time: 16.2 s — ABNORMAL HIGH (ref 11.4–15.2)

## 2023-06-26 LAB — HEMOGLOBIN A1C
Hgb A1c MFr Bld: 9.4 % — ABNORMAL HIGH (ref 4.8–5.6)
Mean Plasma Glucose: 223.08 mg/dL

## 2023-06-26 LAB — HCG, SERUM, QUALITATIVE: Preg, Serum: NEGATIVE

## 2023-06-26 SURGERY — CORONARY/GRAFT ACUTE MI REVASCULARIZATION
Anesthesia: LOCAL

## 2023-06-26 MED ORDER — EPINEPHRINE HCL 5 MG/250ML IV SOLN IN NS
0.5000 ug/min | INTRAVENOUS | Status: DC
  Administered 2023-06-26: 20 ug/min via INTRAVENOUS

## 2023-06-26 MED ORDER — CALCIUM CHLORIDE 10 % IV SOLN
INTRAVENOUS | Status: AC | PRN
Start: 2023-06-26 — End: 2023-06-26
  Administered 2023-06-26: 1 g via INTRAVENOUS

## 2023-06-26 MED ORDER — AMIODARONE HCL 150 MG/3ML IV SOLN
INTRAVENOUS | Status: AC | PRN
Start: 2023-06-26 — End: 2023-06-26
  Administered 2023-06-26: 300 mg via INTRAVENOUS

## 2023-06-26 MED ORDER — FENTANYL CITRATE (PF) 100 MCG/2ML IJ SOLN
INTRAMUSCULAR | Status: AC | PRN
  Administered 2023-06-26 (×2): 100 ug via INTRAVENOUS

## 2023-06-26 MED ORDER — AMIODARONE HCL IN DEXTROSE 360-4.14 MG/200ML-% IV SOLN
60.0000 mg/h | INTRAVENOUS | Status: AC
  Administered 2023-06-26: 60 mg/h via INTRAVENOUS

## 2023-06-26 MED ORDER — FENTANYL CITRATE PF 50 MCG/ML IJ SOSY: 100.0000 ug | PREFILLED_SYRINGE | INTRAMUSCULAR | Status: DC | PRN

## 2023-06-26 MED ORDER — ASPIRIN 300 MG RE SUPP: 300.0000 mg | Freq: Once | RECTAL | Status: DC

## 2023-06-26 MED ORDER — EPINEPHRINE 1 MG/10ML IJ SOSY
PREFILLED_SYRINGE | INTRAMUSCULAR | Status: AC | PRN
Start: 2023-06-26 — End: 2023-06-26
  Administered 2023-06-26: 1 mg via INTRAVENOUS

## 2023-06-26 MED ORDER — AMIODARONE HCL IN DEXTROSE 360-4.14 MG/200ML-% IV SOLN: 30.0000 mg/h | INTRAVENOUS | Status: DC

## 2023-06-26 MED ORDER — EPINEPHRINE 1 MG/10ML IJ SOSY
PREFILLED_SYRINGE | INTRAMUSCULAR | Status: AC | PRN
  Administered 2023-06-26: 1 mg via INTRAVENOUS

## 2023-06-26 MED ORDER — HEPARIN SODIUM (PORCINE) 5000 UNIT/ML IJ SOLN: 4000.0000 [IU] | Freq: Once | INTRAMUSCULAR | Status: DC

## 2023-06-26 MED ORDER — PROPOFOL 1000 MG/100ML IV EMUL: 0.0000 ug/kg/min | INTRAVENOUS | Status: DC

## 2023-06-26 MED ORDER — FENTANYL CITRATE PF 50 MCG/ML IJ SOSY
PREFILLED_SYRINGE | INTRAMUSCULAR | Status: AC
  Filled 2023-06-26: qty 2

## 2023-06-26 MED ORDER — SODIUM BICARBONATE 8.4 % IV SOLN
INTRAVENOUS | Status: AC | PRN
Start: 2023-06-26 — End: 2023-06-26
  Administered 2023-06-26: 50 meq via INTRAVENOUS

## 2023-06-26 MED ORDER — PROPOFOL 10 MG/ML IV BOLUS: 0.5000 mg/kg | Freq: Once | INTRAVENOUS | Status: DC

## 2023-06-26 MED ORDER — PROPOFOL 1000 MG/100ML IV EMUL
INTRAVENOUS | Status: AC
  Administered 2023-06-26: 10 ug/kg/min via INTRAVENOUS
  Filled 2023-06-26: qty 100

## 2023-06-26 MED ORDER — MAGNESIUM SULFATE 50 % IJ SOLN
INTRAMUSCULAR | Status: AC | PRN
Start: 2023-06-26 — End: 2023-06-26
  Administered 2023-06-26: 2 g via INTRAVENOUS

## 2023-06-26 MED ORDER — SODIUM CHLORIDE 0.9 % IV SOLN: INTRAVENOUS | Status: DC

## 2023-06-26 MED ORDER — ASPIRIN 81 MG PO CHEW: 324.0000 mg | CHEWABLE_TABLET | Freq: Once | ORAL | Status: DC

## 2023-07-02 ENCOUNTER — Encounter: Payer: Self-pay | Admitting: Family Medicine

## 2023-07-23 NOTE — ED Notes (Signed)
Patient placement notified of death.

## 2023-07-23 NOTE — ED Notes (Addendum)
TOD 2042 called by Pia Mau PA-C

## 2023-07-23 NOTE — ED Notes (Signed)
While at bedside attempting IV access, pt's respirations slowed and HR dropped from 130 to 100. No palpable pulse, code blue called, CPR initiated. MD [facility] and Critical Care to bedside. Order to use trialysis catheter for code meds per critical care.

## 2023-07-23 NOTE — ED Provider Notes (Signed)
[place] EMERGENCY DEPARTMENT AT [facility] Provider Note   CSN: [id number] Arrival date & time: 06/22/2023  1931     History {Add pertinent medical, surgical, social history, OB history to HPI:1} No chief complaint on file.   Karen [name] is a 47 y.o. female.  HPI     Home Medications Prior to Admission medications   Medication Sig Start Date End Date Taking? Authorizing Provider  amLODipine (NORVASC) 10 MG tablet Take 10 mg by mouth daily.    [provider]  aspirin EC 81 MG tablet Take 81 mg by mouth daily. Swallow whole.    [provider]  atorvastatin (LIPITOR) 40 MG tablet Take 40 mg by mouth daily.  06/08/19   [provider]  calcitRIOL (ROCALTROL) 0.5 MCG capsule Take 3 capsules (1.5 mcg total) by mouth every Monday, Wednesday, and Friday. 10/23/20   Rama, Maryruth Bun, MD  calcium carbonate (OS-CAL) 1250 (500 Ca) MG chewable tablet Chew 1 tablet by mouth 3 (three) times daily.  01/14/16   [provider]  cinacalcet (SENSIPAR) 30 MG tablet Take 1 tablet (30 mg total) by mouth every Monday, Wednesday, and Friday with hemodialysis. 10/23/20   Rama, Maryruth Bun, MD  clonazePAM (KLONOPIN) 1 MG tablet Take 1 tablet (1 mg total) by mouth 2 (two) times daily as needed for anxiety. 10/24/20   Rhetta Mura, MD  clopidogrel (PLAVIX) 75 MG tablet Take 1 tablet (75 mg total) by mouth daily with breakfast. 04/17/20   Rhyne, Ames Coupe, PA-C  Darbepoetin Alfa (ARANESP) 200 MCG/0.4ML SOSY injection Inject 0.4 mLs (200 mcg total) into the vein every Thursday with hemodialysis. 02/03/18   Garnette Gunner, MD  hydrALAZINE (APRESOLINE) 25 MG tablet Take 25 mg by mouth daily. 06/08/19   [provider]  insulin glargine (LANTUS) 100 UNIT/ML injection Inject 9 Units into the skin at bedtime. 01/14/16   [provider]  lactulose (CHRONULAC) 10 GM/15ML solution Take 45 mLs (30 g total) by mouth 2 (two) times daily as needed for mild  constipation. 10/23/20   Rama, Maryruth Bun, MD  lidocaine-prilocaine (EMLA) cream Apply 1 application topically daily as needed (pain). 05/02/19   [provider]  metoprolol succinate (TOPROL-XL) 25 MG 24 hr tablet Take 0.5 tablets (12.5 mg total) by mouth daily. 07/10/18 02/20/28  Osvaldo Shipper, MD  multivitamin (RENA-VIT) TABS tablet Take 1 tablet by mouth at bedtime. 11/13/17   Calvert Cantor, MD  naloxone [facility]) 4 MG/0.1ML LIQD nasal spray kit Place 0.4 mg into the nose once. Repeat after 3 minutes if no or minimal response 01/18/20   [provider]  NOVOFINE 32G X 6 MM MISC  02/01/20   [provider]  ondansetron (ZOFRAN-ODT) 4 MG disintegrating tablet Take 1 tablet (4 mg total) by mouth every 8 (eight) hours as needed for nausea or vomiting. 10/17/20   Benjiman Core, MD  pantoprazole (PROTONIX) 20 MG tablet Take 1 tablet (20 mg total) by mouth daily before breakfast. 10/23/20   Rama, Maryruth Bun, MD      Allergies    Baclofen, Penicillins, Morphine and codeine, and Novolog [insulin aspart]    Review of Systems   Review of Systems  Physical Exam Updated Vital Signs There were no vitals taken for this visit. Physical Exam  ED Results / Procedures / Treatments   Labs (all labs ordered are listed, but only abnormal results are displayed) Labs Reviewed - No data to display  EKG None  Radiology No  results found.  Procedures Procedures  {Document cardiac monitor, telemetry assessment procedure when appropriate:1}  Medications Ordered in ED Medications - No data to display  ED Course/ Medical Decision Making/ A&P   {   Click here for ABCD2, HEART and other calculatorsREFRESH Note before signing :1}                              Medical Decision Making  ***  {Document critical care time when appropriate:1} {Document review of labs and clinical decision tools ie heart score, Chads2Vasc2 etc:1}  {Document your independent review of radiology  images, and any outside records:1} {Document your discussion with family members, caretakers, and with consultants:1} {Document social determinants of health affecting pt's care:1} {Document your decision making why or why not admission, treatments were needed:1} Final Clinical Impression(s) / ED Diagnoses Final diagnoses:  None    Rx / DC Orders ED Discharge Orders     None

## 2023-07-23 NOTE — ED Triage Notes (Addendum)
Pt BIB EMS from [facility], admitted 1 week ago for missed dialysis, DC on Wednesday then readmitted yesterday. Transportation picked her up for dialysis yesterday said she was not her normal self when picked up for dialysis and brought to ED. During dialysis treatment today pt went into respiratory distress, was unresponsive. Bagged for approx. 2 minutes. Received all but 30 minutes of dialysis today.  EMS reports unable to read SpO2. Arrives minimally responsive, groaning in pain. Brought here for EKG showing possible STEMI. Pt arrives with frequent 3-5 beat runs of V-tach. Large wound also noted to L breast, dark and red in appearance. [facility] hospital RN states that L fistula is clotted and catheter was placed in R neck at [facility]

## 2023-07-23 NOTE — Consult Note (Signed)
Cardiology Consultation   Patient ID: Karen [name] MRN: [id number]; DOB: 08-16-1976  Admit date: 07/13/2023 Date of Consult: 07/18/2023  PCP:  Default, Provider, MD   [facility] Providers Cardiologist:  None        Patient Profile:   Karen [name] is a 47 y.o. 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  History of Present Illness:   Karen [name] was receiving dialysis treatment earlier today when she had respiratory distress and became unresponsive.  Her unresponsive episode occurred approximately 30 minutes into her dialysis treatment. Per care team, patient did not lose  pulse and did not require intubation.  She had approximately 2 minutes of diagnostic ablation.   Once her respiratory status improved, patient was found to have ST elevations on telemetry, prompting EKG. EKG showed sinus rhythm with ST elevations in lead III and aVF.  STEMI was activated and patient was brought to the emergency department for further evaluation.   On evaluation of the emergency department, patient was unable to follow commands.  She was not hypotensive,  and her heart rates were in the 130s.  EKG was repeated that showed persistent ST elevations in lead III and aVF, with episodes of NSVT.   Given patient's altered mental status, decision was made to deactivate Cath Lab, and medically manage at this time.   Patient's pastor was at bedside, state that patient has been declining for the past 2 weeks, with alteration of mental status for about 2 weeks.    Past Medical History:  Diagnosis Date   Anemia    Anxiety    Eczema    ESRD (end stage renal disease) (HCC)    Hemo - [facility]- [place]   Exertional  shortness of breath    GERD (gastroesophageal reflux disease)    Headache    History of blood transfusion    History of kidney stones    passed   Hypertension    Ischemic cardiomyopathy    by echo 2014   Neuropathy    Pneumonia 09/2016   Type II diabetes mellitus (HCC) 1995    Past Surgical History:  Procedure Laterality Date   ABDOMINAL AORTOGRAM W/LOWER EXTREMITY Right 04/03/2020   Procedure: ABDOMINAL AORTOGRAM W/LOWER EXTREMITY;  Surgeon: Cephus Shelling, MD;  Location: [facility] INVASIVE CV LAB;  Service: Cardiovascular;  Laterality: Right;   AMPUTATION Left 02/06/2013   Procedure: AMPUTATION LEFT GREAT TOE;  Surgeon: Toni Arthurs, MD;  Location: [facility] OR;  Service: Orthopedics;  Laterality: Left;   AMPUTATION Left 06/24/2013   Procedure: AMPUTATION BELOW KNEE ;  Surgeon: Toni Arthurs, MD;  Location: [facility] OR;  Service: Orthopedics;  Laterality: Left;   AMPUTATION Right 04/10/2020   Procedure: RIGHT AMPUTATION BELOW KNEE;  Surgeon: Maeola Harman, MD;  Location: [facility] OR;  Service: Vascular;  Laterality: Right;   AV FISTULA PLACEMENT Right 02/08/2017   Procedure: CREATION OF RIGHT ARM  BASILIC VEIN TO BRACHIAL ARTERY ARTERIOVENOUS (AV) FISTULA;  Surgeon: Larina Earthly, MD;  Location: [facility] OR;  Service: Vascular;  Laterality: Right;   AV FISTULA PLACEMENT Left 09/10/2017   Procedure: INSERTION OF ARTERIOVENOUS (AV) GORE-TEX GRAFT  LEFT UPPER ARM;  Surgeon: Chuck Hint, MD;  Location:  [facility] OR;  Service: Vascular;  Laterality: Left;   BASCILIC VEIN TRANSPOSITION Right 04/14/2017   Procedure: BASCILIC VEIN TRANSPOSITION-RIGHT 2ND STAGE;  Surgeon: Larina Earthly, MD;  Location: [facility] OR;  Service: Vascular;  Laterality: Right;   CESAREAN SECTION  10/18/2006   INSERTION OF DIALYSIS CATHETER     PERIPHERAL VASCULAR BALLOON ANGIOPLASTY Right 04/03/2020   Procedure: PERIPHERAL VASCULAR BALLOON ANGIOPLASTY;  Surgeon: Cephus Shelling, MD;  Location: [facility] INVASIVE CV LAB;  Service:  Cardiovascular;  Laterality: Right;  SFA   WOUND DEBRIDEMENT Right 04/04/2020   Procedure: RIGHT FOOT DEBRIDEMENT;  Surgeon: Maeola Harman, MD;  Location: [facility] OR;  Service: Vascular;  Laterality: Right;     Home Medications:  Prior to Admission medications   Medication Sig Start Date End Date Taking? Authorizing Provider  amLODipine (NORVASC) 10 MG tablet Take 10 mg by mouth daily.    [provider]  aspirin EC 81 MG tablet Take 81 mg by mouth daily. Swallow whole.    [provider]  atorvastatin (LIPITOR) 40 MG tablet Take 40 mg by mouth daily.  06/08/19   [provider]  calcitRIOL (ROCALTROL) 0.5 MCG capsule Take 3 capsules (1.5 mcg total) by mouth every Monday, Wednesday, and Friday. 10/23/20   Rama, Maryruth Bun, MD  calcium carbonate (OS-CAL) 1250 (500 Ca) MG chewable tablet Chew 1 tablet by mouth 3 (three) times daily.  01/14/16   [provider]  cinacalcet (SENSIPAR) 30 MG tablet Take 1 tablet (30 mg total) by mouth every Monday, Wednesday, and Friday with hemodialysis. 10/23/20   Rama, Maryruth Bun, MD  clonazePAM (KLONOPIN) 1 MG tablet Take 1 tablet (1 mg total) by mouth 2 (two) times daily as needed for anxiety. 10/24/20   Rhetta Mura, MD  clopidogrel (PLAVIX) 75 MG tablet Take 1 tablet (75 mg total) by mouth daily with breakfast. 04/17/20   Rhyne, Ames Coupe, PA-C  Darbepoetin Alfa (ARANESP) 200 MCG/0.4ML SOSY injection Inject 0.4 mLs (200 mcg total) into the vein every Thursday with hemodialysis. 02/03/18   Garnette Gunner, MD  hydrALAZINE (APRESOLINE) 25 MG tablet Take 25 mg by mouth daily. 06/08/19   [provider]  insulin glargine (LANTUS) 100 UNIT/ML injection Inject 9 Units into the skin at bedtime. 01/14/16   [provider]  lactulose (CHRONULAC) 10 GM/15ML solution Take 45 mLs (30 g total) by mouth 2 (two) times daily as needed for mild constipation. 10/23/20   Rama, Maryruth Bun, MD  lidocaine-prilocaine (EMLA)  cream Apply 1 application topically daily as needed (pain). 05/02/19   [provider]  metoprolol succinate (TOPROL-XL) 25 MG 24 hr tablet Take 0.5 tablets (12.5 mg total) by mouth daily. 07/10/18 02/20/28  Osvaldo Shipper, MD  multivitamin (RENA-VIT) TABS tablet Take 1 tablet by mouth at bedtime. 11/13/17   Calvert Cantor, MD  naloxone [facility]) 4 MG/0.1ML LIQD nasal spray kit Place 0.4 mg into the nose once. Repeat after 3 minutes if no or minimal response 01/18/20   [provider]  NOVOFINE 32G X 6 MM MISC  02/01/20   [provider]  ondansetron (ZOFRAN-ODT) 4 MG disintegrating tablet Take 1 tablet (4 mg total) by mouth every 8 (eight) hours as needed for nausea or vomiting. 10/17/20   Benjiman Core, MD  pantoprazole (PROTONIX) 20 MG tablet Take 1 tablet (20 mg total) by mouth daily before breakfast. 10/23/20   Rama, Maryruth Bun, MD    Inpatient Medications: Scheduled Meds:  aspirin  300 mg Rectal Once  fentaNYL       fentaNYL       heparin  4,000 Units Intravenous Once   Continuous Infusions:  sodium chloride     amiodarone Stopped (Jul 19, 2023 2042)   [START ON 06/28/2023] amiodarone     epinephrine Stopped (19-Jul-2023 2042)   propofol (DIPRIVAN) infusion Stopped (07/19/2023 2043)   PRN Meds: fentaNYL, fentaNYL, fentaNYL (SUBLIMAZE) injection, fentaNYL (SUBLIMAZE) injection  Allergies:    Allergies  Allergen Reactions   Baclofen Itching   Penicillins Anaphylaxis, Hives, Rash and Other (See Comments)    PATIENT HAD A PCN REACTION WITH IMMEDIATE RASH, FACIAL/TONGUE/THROAT SWELLING, SOB, OR LIGHTHEADEDNESS WITH HYPOTENSION:  #  #  #  YES  #  #  #   SEVERE RASH INVOLVING MUCUS MEMBRANES or SKIN NECROSIS: #  #  #  YES  #  #  # Has patient had a PCN reaction that required hospitalization No Has patient had a PCN reaction occurring within the last 10 years: No If all of the above answers are "NO", then may proceed with Cephalosporin use.  09/10/16- tolerated  Cefepime    Morphine And Codeine Hives and Rash   Novolog [Insulin Aspart] Other (See Comments)    Cramps/ Gi distress    Social History:   Social History   Socioeconomic History   Marital status: Single    Spouse name: Not on file   Number of children: Not on file   Years of education: Not on file   Highest education level: Not on file  Occupational History   Not on file  Tobacco Use   Smoking status: Former    Current packs/day: 0.00    Average packs/day: 3.0 packs/day for 10.0 years (30.0 ttl pk-yrs)    Types: Cigarettes    Start date: 09/21/1994    Quit date: 09/21/2004    Years since quitting: 18.7   Smokeless tobacco: Never  Vaping Use   Vaping status: Never Used  Substance and Sexual Activity   Alcohol use: No   Drug use: No   Sexual activity: Not Currently    Birth control/protection: None  Other Topics Concern   Not on file  Social History Narrative   Not on file   Social Determinants of Health   Financial Resource Strain: Not on file  Food Insecurity: Not on file  Transportation Needs: Not on file  Physical Activity: Not on file  Stress: Not on file  Social Connections: Unknown (01/19/2022)   Received from [facility]   Social Network    Social Network: Not on file  Intimate Partner Violence: Unknown (12/25/2021)   Received from [facility]   HITS    Physically Hurt: Not on file    Insult or Talk Down To: Not on file    Threaten Physical Harm: Not on file    Scream or Curse: Not on file    Family History:    Family History  Problem Relation Age of Onset   Hypertension Mother    Diabetes Mother    Diabetes Father      ROS:  Unable to assess secondary to altered mental status  Physical Exam/Data:   Vitals:   07-19-23 2023 2023/07/19 2040  BP: (!) 171/116   Resp: 17   Height:  5\' 1"  (1.549 m)   No intake or output data in the 24 hours ending 07/19/23 2101    10/24/2020   12:44 PM 10/24/2020    7:05 AM  10/22/2020  5:12 AM  Last 3 Weights  Weight (lbs) 209 lb 7 oz 209 lb 7 oz 211 lb 6.7 oz  Weight (kg) 95 kg 95 kg 95.9 kg     Body mass index is 39.57 kg/m.  General: Ill-appearing, not following commands HEENT: normocephalic  Neck: JVD elevated, right IJ vascular catheter in place Vascular: Radial pulse, 1+.  Left AV fistula noted  Cardiac:  Tachycardic, regular rhythm, normal S1 and S2, no murmurs  Lungs: Symmetric chest rise, anterior chest clear Abd: Distended, bowel sounds positive Ext: upper extremities are cold Musculoskeletal: Bilateral lower extremity below the knee amputations Skin: Left breast with unhealed  open wound Neuro:: Not following commands Psych: Unable to assess given altered mental status  EKG:  The EKG was personally reviewed and demonstrates:  NSR with ST elevations if III and AVF, NSVT  Telemetry:  Telemetry was personally reviewed and demonstrates:  NSR, runs of NSVT   Relevant CV Studies: 09/12/2017  Study Conclusions   - Left ventricle: The cavity size was normal. Systolic function was    moderately reduced. The estimated ejection fraction was in the    range of 35% to 40%. Diffuse hypokinesis. Doppler parameters are    consistent with a reversible restrictive pattern, indicative of    decreased left ventricular diastolic compliance and/or increased    left atrial pressure (grade 3 diastolic dysfunction).  - Mitral valve: Moderately calcified annulus. Mildly thickened    leaflets . There was mild regurgitation.  - Left atrium: The atrium was mildly dilated. Volume/bsa, ES,    (1-plane Simpson&'s, A2C): 32.9 ml/m^2.  - Pulmonary arteries: Systolic pressure was mildly increased. PA    peak pressure: 32 mm Hg (S).   Impressions:   - No cardiac source of emboli was indentified. Compared to the    prior study, there has been no significant interval change.    Laboratory Data:  High Sensitivity Troponin:   Recent Labs  Lab 07/05/2023 1948   TROPONINIHS 218*     Chemistry Recent Labs  Lab 07/09/2023 1948 07/11/2023 1949  NA 132* 131*  K 5.0 4.9  CL 100  --   GLUCOSE 139*  --   BUN 47*  --   CREATININE 10.60*  --     No results for input(s): "PROT", "ALBUMIN", "AST", "ALT", "ALKPHOS", "BILITOT" in the last 168 hours. Lipids No results for input(s): "CHOL", "TRIG", "HDL", "LABVLDL", "LDLCALC", "CHOLHDL" in the last 168 hours.  Hematology Recent Labs  Lab 07/09/2023 1948 07/18/2023 1949  WBC 24.4*  --   RBC 4.47  --   HGB 12.8  15.3* 15.0  HCT 42.5  45.0 44.0  MCV 95.1  --   MCH 28.6  --   MCHC 30.1  --   RDW 17.2*  --   PLT 162  --    Thyroid No results for input(s): "TSH", "FREET4" in the last 168 hours.  BNPNo results for input(s): "BNP", "PROBNP" in the last 168 hours.  DDimer No results for input(s): "DDIMER" in the last 168 hours.   Radiology/Studies:  DG Chest Portable 1 View  Result Date: 07/01/2023 CLINICAL DATA:  Respiratory distress and unresponsiveness during dialysis EXAM: PORTABLE CHEST 1 VIEW COMPARISON:  Radiograph 07/14/2023 at 6:22 p.m. FINDINGS: Stable cardiomegaly. Right IJ CVC tip in the low SVC. Low lung volumes. Right basilar atelectasis. Otherwise no focal consolidation. No pleural effusion or pneumothorax. IMPRESSION: No acute cardiopulmonary process.  Cardiomegaly. Electronically Signed   By: Angelique Holm.D.  On: 07/21/2023 20:56     Assessment and Plan:   Inferior STEMI-- III and AVF  VT AMS-- patient alert and oriented at baseline  ESRD on IHD with clotted fistula, now with RIJ vascath  5.   PEA arrest  Unfortunately, patient eventually had PEA arrest and after multiple rounds of CPR, she passed away.    For questions or updates, please contact  [facility] Please consult [email or web address] for contact info under    Signed, Lavonia Dana MD MS 07/09/2023 9:01 PM

## 2023-07-23 NOTE — ED Notes (Signed)
Pt in vtach, order for no shock at this time.

## 2023-07-23 NOTE — Progress Notes (Cosign Needed Addendum)
PCCM called to assess patient for icu admission. Patient went to dialysis. Received about 30 minutes of dialysis and went into respiratory distress. On EMS arrival patient unresponsive but w/ pulse. Bagged but did not require intubation. On arrivla to [facility] EKG showing st elevation in inferior leads. Cards consulted. Patient given heparin. Given patient ams decision made to deactivate cath lab. On pccm arrival patient actively coding. ED physician and cardiology at bedside. Patient required cpr for about 30 minutes w/ intermittent rosc. Multiple times had vfib/vtach requiring shocks. Given epi, bicarb, calcium, mag. Spoke with patients closest family/friend who is her pastor. He states only family she has is 18 yo son and the pastor is the closest person to her. He stated that the patient would not want [place] to continue CPR. Decision made to hold off on further resuscitation efforts and patient TOD 2042.   CCT: 35 minutes   JD Anselm Lis [facility] 07/02/2023, 9:02 PM  Please see [email or web address] for pager details.  From 7A-7P if no response, please call [phone]. After hours, please call ELink [phone].

## 2023-07-23 DEATH — deceased
